# Patient Record
Sex: Female | Born: 1985
Health system: Southern US, Community
[De-identification: ages and names within clinical notes are randomized; demographics above are authoritative.]

## PROBLEM LIST (undated history)

## (undated) DIAGNOSIS — B977 Papillomavirus as the cause of diseases classified elsewhere: Secondary | ICD-10-CM

## (undated) DIAGNOSIS — Z8632 Personal history of gestational diabetes: Secondary | ICD-10-CM

## (undated) DIAGNOSIS — B1920 Unspecified viral hepatitis C without hepatic coma: Secondary | ICD-10-CM

## (undated) DIAGNOSIS — T7840XA Allergy, unspecified, initial encounter: Secondary | ICD-10-CM

## (undated) DIAGNOSIS — F1910 Other psychoactive substance abuse, uncomplicated: Secondary | ICD-10-CM

## (undated) DIAGNOSIS — J40 Bronchitis, not specified as acute or chronic: Secondary | ICD-10-CM

## (undated) HISTORY — DX: Papillomavirus as the cause of diseases classified elsewhere: B97.7

## (undated) HISTORY — DX: Bronchitis, not specified as acute or chronic: J40

## (undated) HISTORY — DX: Personal history of gestational diabetes: Z86.32

## (undated) HISTORY — DX: Other psychoactive substance abuse, uncomplicated: F19.10

## (undated) HISTORY — PX: HEMORRHOID BANDING: SHX5850

## (undated) HISTORY — DX: Allergy, unspecified, initial encounter: T78.40XA

## (undated) NOTE — *Deleted (*Deleted)
Physical Medicine and Rehabilitation Admission H&P    Chief Complaint  Patient presents with  . Medical Clearance  : HPI: Anita Davis is a 68 year old right-handed female with history of hepatitis C and polysubstance abuse.  Presented 01/04/2020 with altered mental status.  MRI of the brain showed no acute abnormality.  EEG showed focal nonconvulsive status epilepticus arising from left central temporal region additionally severe diffuse encephalopathy nonspecific etiology.  Admission chemistries AST 130 ALT 121, urine drug screen positive benzos as well as marijuana, alcohol level negative, urinalysis negative nitrite, hemoglobin 16, CK 324, SARS coronavirus negative.  LP completed showing no signs of infection.  Neurology consulted with MRI x2 completed during hospital stay again showing no acute changes.WOC consulted for stage I pressure injury to heels with conservative care.   Palliative care consulted to establish goals of care.  EEGs have continued to show focal seizures.  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  Patient did receive IV steroids and IVIG which made no considerable change in her overall mental status.  She is currently on a dysphagia #1 thin liquid diet.  Lovenox for DVT prophylaxis.  She remains on Vimpat 200 mg twice daily for seizure prophylaxis as well as phenobarbital 60 mg twice daily and Dilantin 200 mg twice daily.  Maintained on methadone for history of polysubstance abuse.  Psychiatry has been consulted continuing to follow.  Therapy evaluations completed and patient was admitted for a comprehensive rehab program  Review of Systems  Unable to perform ROS: Acuity of condition   Past Medical History:  Diagnosis Date  . Allergy   . Bronchitis   . Hepatitis-C   . History of gestational diabetes   . HPV in female   .  Substance abuse [facility])    Past Surgical History:  Procedure Laterality Date  . HEMORRHOID BANDING  age 80   Family History  Problem Relation Age of Onset  . Depression Mother   . Cervical cancer Mother   . Hypertension Father   . Diabetes Father    Social History:  reports that she has been smoking cigarettes and cigars. She has smoked for the past 24.00 years. She has never used smokeless tobacco. She reports previous drug use. Drugs: IV, Cocaine, Hydrocodone, Oxycodone, Marijuana, and Heroin. She reports that she does not drink alcohol. Allergies: No Known Allergies Medications Prior to Admission  Medication Sig Dispense Refill  . cephALEXin (KEFLEX) 500 MG capsule Take 500 mg by mouth 2 (two) times daily. For 5 days.    . cetirizine (ZYRTEC) 10 MG tablet Take 10 mg by mouth daily.    . hydrOXYzine (ATARAX/VISTARIL) 25 MG tablet Take 1-2 tablets (25-50 mg total) by mouth every 6 (six) hours as needed for anxiety. (Patient taking differently: Take 75 mg by mouth 5 (five) times daily. ) 15 tablet 0  . ibuprofen (ADVIL) 200 MG tablet Take 200 mg by mouth every 6 (six) hours as needed.    . METHADONE HCL PO Take 60 mg by mouth daily.     . ondansetron (ZOFRAN-ODT) 4 MG disintegrating tablet Take 4 mg by mouth 3 (three) times daily as needed for nausea or vomiting. Starting 12/24/2019 x 4 days.    . Polyethylene Glycol 3350 (MIRALAX PO) Take by mouth.      Drug Regimen Review { DRUG REGIMEN ZOXWRU:04540}  Home: Home Living Family/patient expects to be discharged to:: Unsure Additional  Comments: pt unable to report history 2/2 aphasia   Functional History: Prior Function Comments: unknown but was able to exit hospital recently so mobile at that time  Functional Status:  Mobility: Bed Mobility Overal bed mobility: Needs Assistance Bed Mobility: Supine to Sit, Sit to Supine Rolling: Mod assist Supine to sit: HOB elevated, Min assist Sit to supine: HOB elevated, Min assist Sit to  sidelying: Total assist General bed mobility comments: Pt able to transition supine <> sit R EOB with HOB elevated and VC's for LE management and HHA on L UE to ascend trunk. Transfers Overall transfer level: Needs assistance Equipment used: Rolling walker (2 wheeled) Transfers: Sit to/from Stand Sit to Stand: Min assist General transfer comment: STS from EOB and toilet, VCs and TCs repeatedly for hand placement on bed or bathroom rail rather than on RW for safety, unsteadiness noted upon coming to stand. Ambulation/Gait Ambulation/Gait assistance: Mod assist Gait Distance (Feet): 40 Feet (bouts of 20 ft x2 with seated rest break between bouts) Assistive device: Rolling walker (2 wheeled) Gait Pattern/deviations: Step-through pattern, Decreased stride length, Staggering left, Narrow base of support General Gait Details: Unsteadiness noted with gait, requiring continual VCs for line management and TCs and assistance to manage RW as she tends to bump or get stuck on obstacles and is unable to correct safely. At end of final gait bout, pt tipped RW over prior to sitting on EOB and required PT to catch RW to maintain safety. 3 LOB bouts towards L or posteriorly, requiring modA to recover.  Gait velocity: decreased Gait velocity interpretation: <1.31 ft/sec, indicative of household ambulator    ADL: ADL Overall ADL's : Needs assistance/impaired Eating/Feeding: Total assistance Eating/Feeding Details (indicate cue type and reason): declines all food and will not hold fork. pt iwth increased voiced tone with presented Grooming: Total assistance Grooming Details (indicate cue type and reason): sitting in chair with detial to between fingers Upper Body Bathing: Total assistance Lower Body Bathing: Total assistance Upper Body Dressing : Total assistance Lower Body Dressing: Min guard, Bed level Toilet Transfer: Moderate assistance, Stand-pivot, BSC Toileting - Clothing Manipulation Details  (indicate cue type and reason): no void - RN informed as they need void of bladder  Functional mobility during ADLs: Total assistance, +2 for physical assistance, +2 for safety/equipment General ADL Comments: pt requires total (A) for all adls.  Cognition: Cognition Overall Cognitive Status: No family/caregiver present to determine baseline cognitive functioning Orientation Level: Oriented X4 Cognition Arousal/Alertness: Awake/alert Behavior During Therapy: Agitated Overall Cognitive Status: No family/caregiver present to determine baseline cognitive functioning General Comments: Pt requires inc time and encouragement to perform tasks with repeated cues. Pt would randomly begin to get upset about some person involving her medical decisions and seemed like she was talking to someone when there was no one there. Oriented to person but no place or date or situation.  Physical Exam: Blood pressure (!) 140/98, pulse 91, temperature 98.7 F (37.1 C), temperature source Oral, resp. rate 18, height 5\' 5"  (1.651 m), weight 69.1 kg, SpO2 98 %. Physical Exam Neurological:     Comments: Patient is lethargic difficult to arouse limiting overall examination.  Nasogastric tube in place.  She did follow some inconsistent commands.     Results for orders placed or performed during the hospital encounter of 01/04/20 (from the past 48 hour(s))  Glucose, capillary     Status: None   Collection Time: 01/31/20 12:00 PM  Result Value Ref Range   Glucose-Capillary 95 70 -  99 mg/dL    Comment: Glucose reference range applies only to samples taken after fasting for at least 8 hours.   Comment 1 Notify RN    Comment 2 Document in Chart   Glucose, capillary     Status: Abnormal   Collection Time: 01/31/20  5:02 PM  Result Value Ref Range   Glucose-Capillary 113 (H) 70 - 99 mg/dL    Comment: Glucose reference range applies only to samples taken after fasting for at least 8 hours.   Comment 1 Notify RN     Comment 2 Document in Chart   Glucose, capillary     Status: Abnormal   Collection Time: 01/31/20  7:23 PM  Result Value Ref Range   Glucose-Capillary 108 (H) 70 - 99 mg/dL    Comment: Glucose reference range applies only to samples taken after fasting for at least 8 hours.   Comment 1 Notify RN    Comment 2 Document in Chart   Glucose, capillary     Status: Abnormal   Collection Time: 01/31/20 11:48 PM  Result Value Ref Range   Glucose-Capillary 116 (H) 70 - 99 mg/dL    Comment: Glucose reference range applies only to samples taken after fasting for at least 8 hours.  CBC with Differential/Platelet     Status: None   Collection Time: 02/01/20  1:07 AM  Result Value Ref Range   WBC 5.3 4.0 - 10.5 K/uL   RBC 4.26 3.87 - 5.11 MIL/uL   Hemoglobin 13.2 12.0 - 15.0 g/dL   HCT 16.1 36 - 46 %   MCV 92.3 80.0 - 100.0 fL   MCH 31.0 26.0 - 34.0 pg   MCHC 33.6 30.0 - 36.0 g/dL   RDW 09.6 04.5 - 40.9 %   Platelets 302 150 - 400 K/uL   nRBC 0.0 0.0 - 0.2 %   Neutrophils Relative % 53 %   Neutro Abs 2.8 1.7 - 7.7 K/uL   Lymphocytes Relative 40 %   Lymphs Abs 2.1 0.7 - 4.0 K/uL   Monocytes Relative 6 %   Monocytes Absolute 0.3 0.1 - 1.0 K/uL   Eosinophils Relative 1 %   Eosinophils Absolute 0.0 0 - 0 K/uL   Basophils Relative 0 %   Basophils Absolute 0.0 0 - 0 K/uL   Immature Granulocytes 0 %   Abs Immature Granulocytes 0.02 0.00 - 0.07 K/uL    Comment: Performed at [facility], [address], [place], [place] [address]  Comprehensive metabolic panel     Status: Abnormal   Collection Time: 02/01/20  1:07 AM  Result Value Ref Range   Sodium 135 135 - 145 mmol/L   Potassium 3.5 3.5 - 5.1 mmol/L   Chloride 101 98 - 111 mmol/L   CO2 25 22 - 32 mmol/L   Glucose, Bld 125 (H) 70 - 99 mg/dL    Comment: Glucose reference range applies only to samples taken after fasting for at least 8 hours.   BUN 8 6 - 20 mg/dL   Creatinine, Ser 4.78 0.44 - 1.00 mg/dL   Calcium 8.8 (L) 8.9 - 10.3  mg/dL   Total Protein 7.7 6.5 - 8.1 g/dL   Albumin 3.1 (L) 3.5 - 5.0 g/dL   AST 80 (H) 15 - 41 U/L   ALT 123 (H) 0 - 44 U/L   Alkaline Phosphatase 73 38 - 126 U/L   Total Bilirubin 0.7 0.3 - 1.2 mg/dL   GFR, Estimated >29 >56 mL/min  Anion gap 9 5 - 15    Comment: Performed at [facility], [address] [place], [place] [address]  Magnesium     Status: None   Collection Time: 02/01/20  1:07 AM  Result Value Ref Range   Magnesium 2.0 1.7 - 2.4 mg/dL    Comment: Performed at [facility], [address] [place], [place] [address]  Phosphorus     Status: None   Collection Time: 02/01/20  1:07 AM  Result Value Ref Range   Phosphorus 3.4 2.5 - 4.6 mg/dL    Comment: Performed at [facility], [address] [place], [place] [address]  Glucose, capillary     Status: Abnormal   Collection Time: 02/01/20  3:54 AM  Result Value Ref Range   Glucose-Capillary 120 (H) 70 - 99 mg/dL    Comment: Glucose reference range applies only to samples taken after fasting for at least 8 hours.   Comment 1 Notify RN    Comment 2 Document in Chart   Glucose, capillary     Status: Abnormal   Collection Time: 02/01/20  8:04 AM  Result Value Ref Range   Glucose-Capillary 102 (H) 70 - 99 mg/dL    Comment: Glucose reference range applies only to samples taken after fasting for at least 8 hours.  Glucose, capillary     Status: Abnormal   Collection Time: 02/01/20 12:00 PM  Result Value Ref Range   Glucose-Capillary 108 (H) 70 - 99 mg/dL    Comment: Glucose reference range applies only to samples taken after fasting for at least 8 hours.  Glucose, capillary     Status: Abnormal   Collection Time: 02/01/20  3:59 PM  Result Value Ref Range   Glucose-Capillary 129 (H) 70 - 99 mg/dL    Comment: Glucose reference range applies only to samples taken after fasting for at least 8 hours.  Glucose, capillary     Status: Abnormal   Collection Time: 02/01/20  8:42 PM  Result Value Ref Range    Glucose-Capillary 137 (H) 70 - 99 mg/dL    Comment: Glucose reference range applies only to samples taken after fasting for at least 8 hours.   Comment 1 Notify RN    Comment 2 Document in Chart   Glucose, capillary     Status: Abnormal   Collection Time: 02/01/20 11:34 PM  Result Value Ref Range   Glucose-Capillary 153 (H) 70 - 99 mg/dL    Comment: Glucose reference range applies only to samples taken after fasting for at least 8 hours.   Comment 1 Notify RN    Comment 2 Document in Chart   Glucose, capillary     Status: None   Collection Time: 02/02/20  4:04 AM  Result Value Ref Range   Glucose-Capillary 97 70 - 99 mg/dL    Comment: Glucose reference range applies only to samples taken after fasting for at least 8 hours.   Comment 1 Notify RN    Comment 2 Document in Chart   Glucose, capillary     Status: Abnormal   Collection Time: 02/02/20  7:43 AM  Result Value Ref Range   Glucose-Capillary 169 (H) 70 - 99 mg/dL    Comment: Glucose reference range applies only to samples taken after fasting for at least 8 hours.  Glucose, capillary     Status: None   Collection Time: 02/02/20 11:44 AM  Result Value Ref Range   Glucose-Capillary 83 70 -  99 mg/dL    Comment: Glucose reference range applies only to samples taken after fasting for at least 8 hours.   DG Abd Portable 1V  Result Date: 02/01/2020 CLINICAL DATA:  Evaluate feeding tube placement EXAM: PORTABLE ABDOMEN - 1 VIEW COMPARISON:  01/28/2020 FINDINGS: The tip of the feeding tube is well below the level of the GE junction in the expected location of the proximal duodenum/pylorus. IMPRESSION: Feeding tube tip in the proximal duodenum/pylorus. Electronically Signed   By: Signa Kell M.D.   On: 02/01/2020 09:59       Medical Problem List and Plan: 1.  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  Patient is on contact precautions  -patient may *** shower   -ELOS/Goals: *** 2.  Antithrombotics: -DVT/anticoagulation: Lovenox  -antiplatelet therapy: *** 3. Pain Management: *** 4. Mood: Amantadine 100 mg twice daily, Xanax 0.25 mg twice daily  -antipsychotic agents: N/A 5. Neuropsych: This patient is not capable of making decisions on her own behalf. 6. Skin/Wound Care: Routine skin checks 7. Fluids/Electrolytes/Nutrition: Routine in and outs with follow-up chemistries 8.  Dysphagia.  Dysphagia #1 nectar liquids.  Supplemental tube feeds. 9.  Seizure disorder.  Dilantin 200 mg twice daily, Vimpat 200 mg twice daily, phenobarbital 60 mg twice daily 10.  History of polysubstance use.  Methadone 60 mg daily   ***  Charlton Amor, PA-C 02/02/2020

---

## 2016-06-14 ENCOUNTER — Emergency Department (HOSPITAL_COMMUNITY)
Admission: EM | Admit: 2016-06-14 | Discharge: 2016-06-14 | Disposition: A | Discharge status: Home / Self Care | Payer: Medicaid Other | Attending: Emergency Medicine | Admitting: Emergency Medicine

## 2016-06-14 ENCOUNTER — Encounter (HOSPITAL_COMMUNITY): Payer: Self-pay | Admitting: Emergency Medicine

## 2016-06-14 ENCOUNTER — Emergency Department (HOSPITAL_COMMUNITY): Payer: Medicaid Other

## 2016-06-14 DIAGNOSIS — Y9389 Activity, other specified: Secondary | ICD-10-CM | POA: Diagnosis not present

## 2016-06-14 DIAGNOSIS — S92244A Nondisplaced fracture of medial cuneiform of right foot, initial encounter for closed fracture: Secondary | ICD-10-CM | POA: Insufficient documentation

## 2016-06-14 DIAGNOSIS — W208XXA Other cause of strike by thrown, projected or falling object, initial encounter: Secondary | ICD-10-CM | POA: Diagnosis not present

## 2016-06-14 DIAGNOSIS — Y929 Unspecified place or not applicable: Secondary | ICD-10-CM | POA: Diagnosis not present

## 2016-06-14 DIAGNOSIS — S92351A Displaced fracture of fifth metatarsal bone, right foot, initial encounter for closed fracture: Secondary | ICD-10-CM | POA: Diagnosis not present

## 2016-06-14 DIAGNOSIS — Z23 Encounter for immunization: Secondary | ICD-10-CM | POA: Insufficient documentation

## 2016-06-14 DIAGNOSIS — Y999 Unspecified external cause status: Secondary | ICD-10-CM | POA: Insufficient documentation

## 2016-06-14 DIAGNOSIS — S92331A Displaced fracture of third metatarsal bone, right foot, initial encounter for closed fracture: Secondary | ICD-10-CM | POA: Diagnosis not present

## 2016-06-14 DIAGNOSIS — F17210 Nicotine dependence, cigarettes, uncomplicated: Secondary | ICD-10-CM | POA: Diagnosis not present

## 2016-06-14 DIAGNOSIS — S99921A Unspecified injury of right foot, initial encounter: Secondary | ICD-10-CM | POA: Diagnosis present

## 2016-06-14 DIAGNOSIS — S92234A Nondisplaced fracture of intermediate cuneiform of right foot, initial encounter for closed fracture: Secondary | ICD-10-CM | POA: Insufficient documentation

## 2016-06-14 DIAGNOSIS — S92301A Fracture of unspecified metatarsal bone(s), right foot, initial encounter for closed fracture: Secondary | ICD-10-CM

## 2016-06-14 DIAGNOSIS — S92321A Displaced fracture of second metatarsal bone, right foot, initial encounter for closed fracture: Secondary | ICD-10-CM | POA: Diagnosis not present

## 2016-06-14 HISTORY — DX: Unspecified viral hepatitis C without hepatic coma: B19.20

## 2016-06-14 MED ORDER — HYDROCODONE-ACETAMINOPHEN 5-325 MG PO TABS: 2.0000 | ORAL_TABLET | ORAL | 0 refills | Status: DC | PRN

## 2016-06-14 MED ORDER — TETANUS-DIPHTH-ACELL PERTUSSIS 5-2.5-18.5 LF-MCG/0.5 IM SUSP
0.5000 mL | Freq: Once | INTRAMUSCULAR | Status: AC
  Administered 2016-06-14: 0.5 mL via INTRAMUSCULAR
  Filled 2016-06-14: qty 0.5

## 2016-06-14 MED ORDER — IBUPROFEN 800 MG PO TABS: 800.0000 mg | ORAL_TABLET | Freq: Three times a day (TID) | ORAL | 0 refills | Status: DC

## 2016-06-14 MED ORDER — HYDROCODONE-ACETAMINOPHEN 5-325 MG PO TABS
2.0000 | ORAL_TABLET | Freq: Once | ORAL | Status: AC
Start: 2016-06-14 — End: 2016-06-14
  Administered 2016-06-14: 2 via ORAL
  Filled 2016-06-14: qty 2

## 2016-06-14 NOTE — ED Triage Notes (Signed)
Pt reports trying to move a 200 lb AC unit from a truck this morning and it fell onto right foot and ankle, states foot is getting numb. Pulses and sensation present.

## 2016-06-14 NOTE — Discharge Instructions (Signed)
Keep the splint in place - foot elevated - crutches - non weight bearing Motrin 3 times daily Ice packs to foot Hydrocodone for severe pain  Please obtain all of your results from medical records or have your doctors office obtain the results - share them with your doctor - you should be seen at your doctors office in the next 2 days. Call today to arrange your follow up. Take the medications as prescribed. Please review all of the medicines and only take them if you do not have an allergy to them. Please be aware that if you are taking birth control pills, taking other prescriptions, ESPECIALLY ANTIBIOTICS may make the birth control ineffective - if this is the case, either do not engage in sexual activity or use alternative methods of birth control such as condoms until you have finished the medicine and your family doctor says it is OK to restart them. If you are on a blood thinner such as COUMADIN, be aware that any other medicine that you take may cause the coumadin to either work too much, or not enough - you should have your coumadin level rechecked in next 7 days if this is the case.  ?  It is also a possibility that you have an allergic reaction to any of the medicines that you have been prescribed - Everybody reacts differently to medications and while MOST people have no trouble with most medicines, you may have a reaction such as nausea, vomiting, rash, swelling, shortness of breath. If this is the case, please stop taking the medicine immediately and contact your physician.  ?  You should return to the ER if you develop severe or worsening symptoms.

## 2016-06-14 NOTE — ED Provider Notes (Signed)
[facility]-EMERGENCY DEPT Provider Note   CSN: [id number] Arrival date & time: 06/14/16  1021  By signing my name below, I, Anita Davis, attest that this documentation has been prepared under the direction and in the presence of Anita Hong, MD. Electronically Signed: Cynda Davis, Scribe. 06/14/16. 11:12 AM.   History   Chief Complaint Chief Complaint  Patient presents with  . Foot Injury   HPI Comments: Anita Davis is a 31 y.o. female who presents to the Emergency Department complaining of sudden-onset, constant right foot pain s/p a foot injury that happened this morning. Patient reports trying to move a 200 pound [employer] unit from a truck this morning. The patient reports accidentally dropping the [facility] unit on her right foot. Patient has associated right middle toe numbness and ankle pain. No modifying factors indicated. Patient denies any other symptoms.   The history is provided by the patient. No language interpreter was used.    Past Medical History:  Diagnosis Date  . Hepatitis-C     There are no active problems to display for this patient.   Past Surgical History:  Procedure Laterality Date  . HEMORRHOID BANDING      OB History    No data available       Home Medications    Prior to Admission medications   Medication Sig Start Date End Date Taking? Authorizing Provider  HYDROcodone-acetaminophen (NORCO/VICODIN) 5-325 MG tablet Take 2 tablets by mouth every 4 (four) hours as needed. 06/14/16   Anita Hong, MD  ibuprofen (ADVIL,MOTRIN) 800 MG tablet Take 1 tablet (800 mg total) by mouth 3 (three) times daily. 06/14/16   Anita Hong, MD    Family History History reviewed. No pertinent family history.  Social History Social History  Substance Use Topics  . Smoking status: Current Every Day Smoker    Types: E-cigarettes  . Smokeless tobacco: Not on file  . Alcohol use No    Allergies   Patient has no known allergies.  Review of Systems Review of Systems    Musculoskeletal: Positive for arthralgias (right foot, right ankle).  All other systems reviewed and are negative.  Physical Exam Updated Vital Signs BP 134/86 (BP Location: Left Arm)   Pulse 86   Temp 97.9 F (36.6 C) (Oral)   Resp 24   Ht 5\' 3"  (1.6 m)   Wt 145 lb (65.8 kg)   LMP 05/31/2016 (Exact Date)   SpO2 96%   BMI 25.69 kg/m   Physical Exam  Constitutional: She is oriented to person, place, and time. She appears well-developed.  HENT:  Head: Normocephalic and atraumatic.  Mouth/Throat: Oropharynx is clear and moist.  Eyes: Conjunctivae and EOM are normal. Pupils are equal, round, and reactive to light.  Neck: Normal range of motion. Neck supple.  Cardiovascular: Normal rate.   Pulmonary/Chest: Effort normal.  Abdominal: Soft. Bowel sounds are normal.  Musculoskeletal: Normal range of motion.  Good pulse of the posterior tibial. Contusion of the dorsum of the right foot with minimal swelling. Decreed range of motion secondary to pain.Decreased sensation of the middle right toe.   Neurological: She is alert and oriented to person, place, and time.  Skin: Skin is warm and dry.  Psychiatric: She has a normal mood and affect.     ED Treatments / Results  DIAGNOSTIC STUDIES: Oxygen Saturation is 96% on RA, adequate by my interpretation.    COORDINATION OF CARE: 11:11 AM Discussed treatment plan with pt at bedside and pt agreed to plan,  which includes pain medication and imaging.   Labs (all labs ordered are listed, but only abnormal results are displayed) Labs Reviewed - No data to display  Radiology Dg Ankle Complete Right  Result Date: 06/14/2016 CLINICAL DATA:  Right foot and ankle pain status post trauma. EXAM: RIGHT ANKLE - COMPLETE 3+ VIEW COMPARISON:  None. FINDINGS: There is no evidence of fracture, dislocation, or joint effusion of the right ankle. There is no evidence of arthropathy or other focal bone abnormality. Right foot fractures are better seen on  the dedicated foot radiographs. Soft tissues are unremarkable. IMPRESSION: No evidence of fracture dislocation of the right ankle. Electronically Signed   By: Anita Davis  Dimitrova M.D.   On: 06/14/2016 11:02   Dg Foot Complete Right  Result Date: 06/14/2016 CLINICAL DATA:  Right foot pain and abrasion post crushing injury. EXAM: RIGHT FOOT COMPLETE - 3+ VIEW COMPARISON:  None. FINDINGS: There are minimally displaced fractures of the base of the first, second and third metatarsal bones, with intra-articular extension. Linear lucencies through the medial and intermediate cuneiform likely represent nondisplaced fractures. There is a potential for Lisfranc joint disruption. There is an associated soft tissue swelling. IMPRESSION: Minimally displaced comminuted fractures of the base of the first, second and third metatarsal bones. Potential nondisplaced fractures of the medial and intermediate cuneiform bones. Potential disruption of the Lisfranc joint. Orthopedic consultation may be considered. Electronically Signed   By: Anita Davis  Dimitrova M.D.   On: 06/14/2016 11:00    Procedures Procedures (including critical care time)  Medications Ordered in ED Medications  HYDROcodone-acetaminophen (NORCO/VICODIN) 5-325 MG per tablet 2 tablet (2 tablets Oral Given 06/14/16 1118)  Tdap (BOOSTRIX) injection 0.5 mL (0.5 mLs Intramuscular Given 06/14/16 1119)     Initial Impression / Assessment and Plan / ED Course  I have reviewed the triage vital signs and the nursing notes.  Pertinent labs & imaging results that were available during my care of the patient were reviewed by me and considered in my medical decision making (see chart for details).    Fractures seen - MT's and cuneiforms D/w Dr. Romeo AppleHarrison - will see in clinic Posterior splint and crutches Pt informed No compartment syndrome seen  Final Clinical Impressions(s) / ED Diagnoses   Final diagnoses:  Closed displaced fracture of metatarsal bone of  right foot, unspecified metatarsal, initial encounter    New Prescriptions New Prescriptions   HYDROCODONE-ACETAMINOPHEN (NORCO/VICODIN) 5-325 MG TABLET    Take 2 tablets by mouth every 4 (four) hours as needed.   IBUPROFEN (ADVIL,MOTRIN) 800 MG TABLET    Take 1 tablet (800 mg total) by mouth 3 (three) times daily.   I personally performed the services described in this documentation, which was scribed in my presence. The recorded information has been reviewed and is accurate.         Anita HongBrian Oluwademilade Kellett, MD 06/14/16 [phone]

## 2016-06-18 ENCOUNTER — Ambulatory Visit (INDEPENDENT_AMBULATORY_CARE_PROVIDER_SITE_OTHER): Payer: Medicaid Other | Admitting: Orthopedic Surgery

## 2016-06-18 DIAGNOSIS — S92314A Nondisplaced fracture of first metatarsal bone, right foot, initial encounter for closed fracture: Secondary | ICD-10-CM

## 2016-06-18 DIAGNOSIS — S92324A Nondisplaced fracture of second metatarsal bone, right foot, initial encounter for closed fracture: Secondary | ICD-10-CM | POA: Diagnosis not present

## 2016-06-18 DIAGNOSIS — S92334A Nondisplaced fracture of third metatarsal bone, right foot, initial encounter for closed fracture: Secondary | ICD-10-CM

## 2016-06-18 MED ORDER — HYDROCODONE-ACETAMINOPHEN 5-325 MG PO TABS: 2.0000 | ORAL_TABLET | ORAL | 0 refills | Status: DC | PRN

## 2016-06-18 MED ORDER — HYDROCODONE-ACETAMINOPHEN 5-325 MG PO TABS: 1.0000 | ORAL_TABLET | ORAL | 0 refills | Status: DC | PRN

## 2016-06-18 NOTE — Progress Notes (Addendum)
Patient ID: Anita Davis, female   DOB: Aug 29, 1985, 31 y.o.   MRN: [id number]  Pain right foot  HPI Anita Davis is a 31 y.o. female.  31 year old female presents for evaluation after blunt trauma to the right foot secondary to large object (250 pound air conditioner) falling onto the foot. She sustained a closed first, second, third metatarsal fracture. She was placed in a posterior splint  She complains of moderate to severe pain and swelling with some decreased sensation in all the digits of the toes on the right foot  Review of Systems Review of Systems  Constitutional: Negative for fever.  Musculoskeletal: Positive for gait problem and joint swelling.  Neurological: Positive for numbness. Negative for weakness.   (2 MINIMUM)  Past Medical History:  Diagnosis Date  . Hepatitis-C     Past Surgical History:  Procedure Laterality Date  . HEMORRHOID BANDING      Social History Social History  Substance Use Topics  . Smoking status: Current Every Day Smoker    Types: E-cigarettes  . Smokeless tobacco: Not on file  . Alcohol use No    No Known Allergies  No outpatient prescriptions have been marked as taking for the 06/18/16 encounter (Appointment) with Vickki HearingStanley E Harrison, MD.      Physical Exam Physical Exam 1.LMP 05/31/2016 (Exact Date)   2. Gen. appearance. The patient is well-developed and well-nourished, grooming and hygiene are normal. There are no gross congenital abnormalities  3. The patient is alert and oriented to person place and time  4. Mood and affect are normal  5. Ambulation Extremely guarded gait secondary to fracture supported by Crutches no weightbearing  Examination reveals the following: 6. On inspection we find tenderness and swelling over the dorsum of the midfoot  7. With the range of motion of  right ankle is normal  8. Stability tests were normal  right ankle normal, could not test Lisfranc joint  9. Strength tests revealed grade 5  motor strength  10. Skin we find dorsal abrasion   11. Sensation decreased in all 5 digits  12 Vascular system shows no peripheral edema  I did not detect intense or tense compartments  MEDICAL DECISION MAKING:    Data Reviewed X-rays ankle and foot first second and third metatarsal base fractures with nondisplaced fracture medial and middle cuneiform  Assessment Encounter Diagnoses  Name Primary?  . Closed nondisplaced fracture of first metatarsal bone of right foot, initial encounter Yes  . Closed nondisplaced fracture of second metatarsal bone of right foot, initial encounter   . Closed nondisplaced fracture of third metatarsal bone of right foot, initial encounter    Meds ordered this encounter  Medications  . HYDROcodone-acetaminophen (NORCO/VICODIN) 5-325 MG tablet    Sig: Take 2 tablets by mouth every 4 (four) hours as needed.    Dispense:  30 tablet    Refill:  0     Plan Short Cam Walker no weightbearing 6 weeks No work 6 weeks Return 6 weeks x-ray right foot may need stress x-ray  Fuller CanadaStanley Harrison 06/18/2016, 1:33 PM

## 2016-06-18 NOTE — Patient Instructions (Addendum)
No weightbearing for 6 weeks  No work x 6 weeks

## 2016-07-30 ENCOUNTER — Ambulatory Visit (INDEPENDENT_AMBULATORY_CARE_PROVIDER_SITE_OTHER): Payer: Medicaid Other

## 2016-07-30 ENCOUNTER — Encounter: Payer: Self-pay | Admitting: Orthopedic Surgery

## 2016-07-30 ENCOUNTER — Ambulatory Visit (INDEPENDENT_AMBULATORY_CARE_PROVIDER_SITE_OTHER): Payer: Self-pay | Admitting: Orthopedic Surgery

## 2016-07-30 DIAGNOSIS — S92334A Nondisplaced fracture of third metatarsal bone, right foot, initial encounter for closed fracture: Secondary | ICD-10-CM

## 2016-07-30 DIAGNOSIS — S92314D Nondisplaced fracture of first metatarsal bone, right foot, subsequent encounter for fracture with routine healing: Secondary | ICD-10-CM

## 2016-07-30 DIAGNOSIS — S92314A Nondisplaced fracture of first metatarsal bone, right foot, initial encounter for closed fracture: Secondary | ICD-10-CM

## 2016-07-30 DIAGNOSIS — S92324A Nondisplaced fracture of second metatarsal bone, right foot, initial encounter for closed fracture: Secondary | ICD-10-CM

## 2016-07-30 MED ORDER — HYDROCODONE-ACETAMINOPHEN 5-325 MG PO TABS: 1.0000 | ORAL_TABLET | Freq: Four times a day (QID) | ORAL | 0 refills | Status: DC | PRN

## 2016-07-30 NOTE — Progress Notes (Signed)
Chief Complaint  Patient presents with  . Follow-up    RIGHT FOOT FRACTURE, DOI 06/14/16    Right foot multiple fractures from air conditioner falling on her right foot she's been in a boot. She's been weightbearing in the boot. Her x-rays show no tarsometatarsal displacement  She still has some numbness on the top of the foot some weakness in extension flexion of the toes and some pain on the dorsal top of the foot  Foot alignment looks normal there is no instability of the tarsometatarsal joint there is some tenderness and she does exhibit decreased flexion of the MTP joints of all digits  X-rays show that the fractures are healing is no displacement of the tarsometatarsal joints  She will ambulate for 3 more weeks and her Cam Walker and then come back for follow-up visit no x-rays needed at that time she may return to work with limited activity per her limits regarding pain  Meds ordered this encounter  Medications  . HYDROcodone-acetaminophen (NORCO/VICODIN) 5-325 MG tablet    Sig: Take 1 tablet by mouth every 6 (six) hours as needed.    Dispense:  30 tablet    Refill:  0

## 2016-08-20 ENCOUNTER — Ambulatory Visit: Payer: Medicaid Other | Admitting: Orthopedic Surgery

## 2016-08-22 ENCOUNTER — Emergency Department (HOSPITAL_COMMUNITY)
Admission: EM | Admit: 2016-08-22 | Discharge: 2016-08-22 | Disposition: A | Discharge status: Home / Self Care | Payer: Self-pay | Attending: Emergency Medicine | Admitting: Emergency Medicine

## 2016-08-22 ENCOUNTER — Encounter (HOSPITAL_COMMUNITY): Payer: Self-pay | Admitting: Emergency Medicine

## 2016-08-22 DIAGNOSIS — R112 Nausea with vomiting, unspecified: Secondary | ICD-10-CM | POA: Insufficient documentation

## 2016-08-22 DIAGNOSIS — J029 Acute pharyngitis, unspecified: Secondary | ICD-10-CM | POA: Insufficient documentation

## 2016-08-22 DIAGNOSIS — Z79899 Other long term (current) drug therapy: Secondary | ICD-10-CM | POA: Insufficient documentation

## 2016-08-22 DIAGNOSIS — R197 Diarrhea, unspecified: Secondary | ICD-10-CM | POA: Insufficient documentation

## 2016-08-22 DIAGNOSIS — F17210 Nicotine dependence, cigarettes, uncomplicated: Secondary | ICD-10-CM | POA: Insufficient documentation

## 2016-08-22 DIAGNOSIS — Z791 Long term (current) use of non-steroidal anti-inflammatories (NSAID): Secondary | ICD-10-CM | POA: Insufficient documentation

## 2016-08-22 DIAGNOSIS — J069 Acute upper respiratory infection, unspecified: Secondary | ICD-10-CM | POA: Insufficient documentation

## 2016-08-22 MED ORDER — PSEUDOEPHEDRINE HCL 60 MG PO TABS
60.0000 mg | ORAL_TABLET | Freq: Once | ORAL | Status: AC
  Administered 2016-08-22: 60 mg via ORAL
  Filled 2016-08-22: qty 1

## 2016-08-22 MED ORDER — AMOXICILLIN 250 MG PO CAPS
500.0000 mg | ORAL_CAPSULE | Freq: Once | ORAL | Status: AC
  Administered 2016-08-22: 500 mg via ORAL
  Filled 2016-08-22: qty 2

## 2016-08-22 MED ORDER — TRAMADOL HCL 50 MG PO TABS
100.0000 mg | ORAL_TABLET | Freq: Once | ORAL | Status: AC
  Administered 2016-08-22: 100 mg via ORAL
  Filled 2016-08-22: qty 2

## 2016-08-22 MED ORDER — LIDOCAINE VISCOUS 2 % MT SOLN
15.0000 mL | Freq: Once | OROMUCOSAL | Status: AC
Start: 2016-08-22 — End: 2016-08-22
  Administered 2016-08-22: 15 mL via OROMUCOSAL
  Filled 2016-08-22: qty 15

## 2016-08-22 MED ORDER — TRAMADOL HCL 50 MG PO TABS: 50.0000 mg | ORAL_TABLET | Freq: Four times a day (QID) | ORAL | 0 refills | Status: DC | PRN

## 2016-08-22 MED ORDER — DEXAMETHASONE SODIUM PHOSPHATE 4 MG/ML IJ SOLN
8.0000 mg | Freq: Once | INTRAMUSCULAR | Status: AC
  Administered 2016-08-22: 8 mg via INTRAMUSCULAR
  Filled 2016-08-22: qty 2

## 2016-08-22 MED ORDER — AMOXICILLIN 500 MG PO CAPS: 500.0000 mg | ORAL_CAPSULE | Freq: Three times a day (TID) | ORAL | 0 refills | Status: DC

## 2016-08-22 NOTE — Discharge Instructions (Signed)
Your examination suggest a pharyngitis, sinusitis, and symptoms of upper respiratory infection. Please use Amoxil 3 times daily with food. Please use Sudafed, for Claritin-D for congestion in the sinuses and also to assist with headache. Please continue Tylenol every 4 hours, ibuprofen every 6 hours for moderate pain. May use Ultram for more severe pain. Ultram may cause drowsiness, please use this medication with caution. Please use your  mask until symptoms have resolved. Please wash hands frequently, and increase fluids.

## 2016-08-22 NOTE — ED Provider Notes (Signed)
[facility] DEPT Provider Note   CSN: [id number] Arrival date & time: 08/22/16  1034     History   Chief Complaint Chief Complaint  Patient presents with  . flu like sx    HPI Anita Davis is a 31 y.o. female.   URI   This is a new problem. The current episode started more than 2 days ago. The problem has been gradually worsening. Associated symptoms include diarrhea, nausea, vomiting, congestion, headaches, sore throat and cough. She has tried rest (tylenol) for the symptoms. The treatment provided no relief.    Past Medical History:  Diagnosis Date  . Hepatitis-C     There are no active problems to display for this patient.   Past Surgical History:  Procedure Laterality Date  . HEMORRHOID BANDING      OB History    No data available       Home Medications    Prior to Admission medications   Medication Sig Start Date End Date Taking? Authorizing Provider  HYDROcodone-acetaminophen (NORCO/VICODIN) 5-325 MG tablet Take 1 tablet by mouth every 6 (six) hours as needed. 07/30/16   Vickki Hearing, MD  ibuprofen (ADVIL,MOTRIN) 800 MG tablet Take 1 tablet (800 mg total) by mouth 3 (three) times daily. 06/14/16   [name], MD    Family History History reviewed. No pertinent family history.  Social History Social History  Substance Use Topics  . Smoking status: Current Every Day Smoker    Types: E-cigarettes  . Smokeless tobacco: Never Used  . Alcohol use No     Allergies   Patient has no known allergies.   Review of Systems Review of Systems  Constitutional: Positive for chills.  HENT: Positive for congestion and sore throat.   Respiratory: Positive for cough.   Gastrointestinal: Positive for diarrhea, nausea and vomiting.  Musculoskeletal: Positive for myalgias.  Neurological: Positive for headaches.  All other systems reviewed and are negative.    Physical Exam Updated Vital Signs BP 132/72   Pulse 77   Temp 98.8 F (37.1 C) (Oral)    Resp 18   Ht  (1.6 m)   Wt 68 kg   LMP 08/15/2016   SpO2 100%   BMI 26.57 kg/m   Physical Exam  Constitutional: She is oriented to person, place, and time. She appears well-developed and well-nourished.  Non-toxic appearance.  HENT:  Head: Normocephalic.  Right Ear: Tympanic membrane and external ear normal.  Left Ear: Tympanic membrane and external ear normal.  Mouth/Throat: Uvula swelling present. Posterior oropharyngeal erythema present.  Nasal congestion present.  Eyes: EOM and lids are normal. Pupils are equal, round, and reactive to light.  Neck: Normal range of motion. Neck supple. Carotid bruit is not present.  Cardiovascular: Normal rate, regular rhythm, normal heart sounds, intact distal pulses and normal pulses.   Pulmonary/Chest: Breath sounds normal. No respiratory distress.  Abdominal: Soft. Bowel sounds are normal. There is no tenderness. There is no guarding.  Musculoskeletal: Normal range of motion.  Lymphadenopathy:       Head (right side): No submandibular adenopathy present.       Head (left side): No submandibular adenopathy present.    She has no cervical adenopathy.  Neurological: She is alert and oriented to person, place, and time. She has normal strength. No cranial nerve deficit or sensory deficit.  Skin: Skin is warm and dry.  No rash in the palm of the hands.  Psychiatric: She has a normal mood and affect. Her  speech is normal.  Nursing note and vitals reviewed.    ED Treatments / Results  Labs (all labs ordered are listed, but only abnormal results are displayed) Labs Reviewed - No data to display  EKG  EKG Interpretation None       Radiology No results found.  Procedures Procedures (including critical care time)  Medications Ordered in ED Medications - No data to display   Initial Impression / Assessment and Plan / ED Course  I have reviewed the triage vital signs and the nursing notes.  Pertinent labs & imaging results  that were available during my care of the patient were reviewed by me and considered in my medical decision making (see chart for details).       Final Clinical Impressions(s) / ED Diagnoses MDM Patient is a[age] year old female with upper respiratory symptoms over the last 3 days. She's had some nausea vomiting 2 on yesterday. Her vital signs within normal limits, and show no evidence of severe dehydration. The examination favors acute pharyngitis and upper respiratory infection. The patient will be treated with Amoxil. Patient is to follow-up with her primary physician or return to the emergency department if any significant changes, problems, or concerns.    Final diagnoses:  Acute pharyngitis, unspecified etiology  Upper respiratory tract infection, unspecified type    New Prescriptions Discharge Medication List as of 08/22/2016 12:20 PM    START taking these medications   Details  amoxicillin (AMOXIL) 500 MG capsule Take 1 capsule (500 mg total) by mouth 3 (three) times daily., Starting Wed 08/22/2016, Print    traMADol (ULTRAM) 50 MG tablet Take 1 tablet (50 mg total) by mouth every 6 (six) hours as needed., Starting Wed 08/22/2016, Print         Ivery Quale, PA-C 08/23/16 Jeni Salles, MD 08/25/16 [phone]

## 2016-08-22 NOTE — ED Triage Notes (Signed)
Pt c/o gen body aches, sore throat and fever x 3 days. n/v x 2 yesterday. Has eaten since with no vomiting. nad. No weakness noted.

## 2016-08-27 ENCOUNTER — Ambulatory Visit (INDEPENDENT_AMBULATORY_CARE_PROVIDER_SITE_OTHER): Payer: Self-pay

## 2016-08-27 ENCOUNTER — Ambulatory Visit (INDEPENDENT_AMBULATORY_CARE_PROVIDER_SITE_OTHER): Payer: Self-pay | Admitting: Orthopedic Surgery

## 2016-08-27 ENCOUNTER — Encounter: Payer: Self-pay | Admitting: Orthopedic Surgery

## 2016-08-27 DIAGNOSIS — S92334D Nondisplaced fracture of third metatarsal bone, right foot, subsequent encounter for fracture with routine healing: Secondary | ICD-10-CM

## 2016-08-27 DIAGNOSIS — S92341D Displaced fracture of fourth metatarsal bone, right foot, subsequent encounter for fracture with routine healing: Secondary | ICD-10-CM

## 2016-08-27 DIAGNOSIS — S92324D Nondisplaced fracture of second metatarsal bone, right foot, subsequent encounter for fracture with routine healing: Secondary | ICD-10-CM

## 2016-08-27 DIAGNOSIS — S92314D Nondisplaced fracture of first metatarsal bone, right foot, subsequent encounter for fracture with routine healing: Secondary | ICD-10-CM

## 2016-08-27 MED ORDER — HYDROCODONE-ACETAMINOPHEN 5-325 MG PO TABS: 1.0000 | ORAL_TABLET | Freq: Three times a day (TID) | ORAL | 0 refills | Status: DC | PRN

## 2016-08-27 NOTE — Progress Notes (Signed)
Chief Complaint  Patient presents with  . Follow-up    Recheck right foot fracture, DOi 06-14-16.   C/O PAIN   TENDER AT THE FRACTURE SITES BUT ITS MILD   I THINK THE BOOT CAN COME OFF IN 2 WEEKS   FU AS NEEDED   Meds ordered this encounter  Medications  . HYDROcodone-acetaminophen (NORCO/VICODIN) 5-325 MG tablet    Sig: Take 1 tablet by mouth every 8 (eight) hours as needed.    Dispense:  30 tablet    Refill:  0    Encounter Diagnoses  Name Primary?  . Closed nondisplaced fracture of first metatarsal bone of right foot with routine healing, subsequent encounter Yes  . Displaced fracture of fourth metatarsal bone, right foot, subsequent encounter for fracture with routine healing   . Closed nondisplaced fracture of second metatarsal bone of right foot with routine healing, subsequent encounter   . Closed nondisplaced fracture of third metatarsal bone of right foot with routine healing, subsequent encounter

## 2017-01-23 ENCOUNTER — Emergency Department (HOSPITAL_COMMUNITY): Payer: Self-pay

## 2017-01-23 ENCOUNTER — Inpatient Hospital Stay (HOSPITAL_COMMUNITY)
Admission: EM | Admit: 2017-01-23 | Discharge: 2017-01-25 | DRG: 392 | Discharge status: Against Medical Advice | Payer: Self-pay | Attending: Internal Medicine | Admitting: Internal Medicine

## 2017-01-23 ENCOUNTER — Encounter (HOSPITAL_COMMUNITY): Payer: Self-pay | Admitting: Emergency Medicine

## 2017-01-23 DIAGNOSIS — R739 Hyperglycemia, unspecified: Secondary | ICD-10-CM

## 2017-01-23 DIAGNOSIS — R000 Tachycardia, unspecified: Secondary | ICD-10-CM | POA: Diagnosis present

## 2017-01-23 DIAGNOSIS — R109 Unspecified abdominal pain: Principal | ICD-10-CM | POA: Diagnosis present

## 2017-01-23 DIAGNOSIS — K219 Gastro-esophageal reflux disease without esophagitis: Secondary | ICD-10-CM | POA: Diagnosis present

## 2017-01-23 DIAGNOSIS — D649 Anemia, unspecified: Secondary | ICD-10-CM

## 2017-01-23 DIAGNOSIS — K810 Acute cholecystitis: Secondary | ICD-10-CM

## 2017-01-23 DIAGNOSIS — F17210 Nicotine dependence, cigarettes, uncomplicated: Secondary | ICD-10-CM | POA: Diagnosis present

## 2017-01-23 DIAGNOSIS — Z23 Encounter for immunization: Secondary | ICD-10-CM

## 2017-01-23 DIAGNOSIS — Z8049 Family history of malignant neoplasm of other genital organs: Secondary | ICD-10-CM

## 2017-01-23 DIAGNOSIS — Z833 Family history of diabetes mellitus: Secondary | ICD-10-CM

## 2017-01-23 DIAGNOSIS — R509 Fever, unspecified: Secondary | ICD-10-CM

## 2017-01-23 DIAGNOSIS — Z8249 Family history of ischemic heart disease and other diseases of the circulatory system: Secondary | ICD-10-CM

## 2017-01-23 DIAGNOSIS — Z818 Family history of other mental and behavioral disorders: Secondary | ICD-10-CM

## 2017-01-23 DIAGNOSIS — R1011 Right upper quadrant pain: Secondary | ICD-10-CM

## 2017-01-23 LAB — COMPREHENSIVE METABOLIC PANEL
ALBUMIN: 4.1 g/dL (ref 3.5–5.0)
ALT: 51 U/L (ref 14–54)
AST: 47 U/L — AB (ref 15–41)
Alkaline Phosphatase: 65 U/L (ref 38–126)
Anion gap: 7 (ref 5–15)
BILIRUBIN TOTAL: 0.5 mg/dL (ref 0.3–1.2)
BUN: 8 mg/dL (ref 6–20)
CHLORIDE: 100 mmol/L — AB (ref 101–111)
CO2: 28 mmol/L (ref 22–32)
CREATININE: 0.65 mg/dL (ref 0.44–1.00)
Calcium: 8.7 mg/dL — ABNORMAL LOW (ref 8.9–10.3)
GFR calc Af Amer: >60 mL/min (ref >60)
GLUCOSE: 112 mg/dL — AB (ref 65–99)
Potassium: 3.9 mmol/L (ref 3.5–5.1)
Sodium: 135 mmol/L (ref 135–145)
Total Protein: 7.5 g/dL (ref 6.5–8.1)

## 2017-01-23 LAB — URINALYSIS, ROUTINE W REFLEX MICROSCOPIC
BILIRUBIN URINE: NEGATIVE
GLUCOSE, UA: NEGATIVE mg/dL
HGB URINE DIPSTICK: NEGATIVE
Ketones, ur: NEGATIVE mg/dL
Leukocytes, UA: NEGATIVE
Nitrite: NEGATIVE
Protein, ur: NEGATIVE mg/dL
SPECIFIC GRAVITY, URINE: 1.01 (ref 1.005–1.030)
pH: 5 (ref 5.0–8.0)

## 2017-01-23 LAB — CBC WITH DIFFERENTIAL/PLATELET
BASOS ABS: 0 10*3/uL (ref 0.0–0.1)
Basophils Relative: 0 %
Eosinophils Absolute: 0 10*3/uL (ref 0.0–0.7)
Eosinophils Relative: 0 %
HEMATOCRIT: 40 % (ref 36.0–46.0)
Hemoglobin: 13.2 g/dL (ref 12.0–15.0)
LYMPHS PCT: 13 %
Lymphs Abs: 1.3 10*3/uL (ref 0.7–4.0)
MCH: 30.1 pg (ref 26.0–34.0)
MCHC: 33 g/dL (ref 30.0–36.0)
MCV: 91.3 fL (ref 78.0–100.0)
Monocytes Absolute: 0.4 10*3/uL (ref 0.1–1.0)
Monocytes Relative: 4 %
NEUTROS ABS: 8.8 10*3/uL — AB (ref 1.7–7.7)
Neutrophils Relative %: 83 %
PLATELETS: 212 10*3/uL (ref 150–400)
RBC: 4.38 MIL/uL (ref 3.87–5.11)
RDW: 13.1 % (ref 11.5–15.5)
WBC: 10.5 10*3/uL (ref 4.0–10.5)

## 2017-01-23 LAB — LACTIC ACID, PLASMA: LACTIC ACID, VENOUS: 0.7 mmol/L (ref 0.5–1.9)

## 2017-01-23 LAB — PREGNANCY, URINE: PREG TEST UR: NEGATIVE

## 2017-01-23 LAB — LIPASE, BLOOD: Lipase: 22 U/L (ref 11–51)

## 2017-01-23 MED ORDER — HYDROMORPHONE HCL 1 MG/ML IJ SOLN
1.0000 mg | Freq: Once | INTRAMUSCULAR | Status: AC
  Administered 2017-01-23: 1 mg via INTRAVENOUS
  Filled 2017-01-23: qty 1

## 2017-01-23 MED ORDER — SODIUM CHLORIDE 0.9 % IV BOLUS (SEPSIS)
1000.0000 mL | Freq: Once | INTRAVENOUS | Status: AC
  Administered 2017-01-23: 1000 mL via INTRAVENOUS

## 2017-01-23 MED ORDER — SODIUM CHLORIDE 0.9 % IV SOLN
INTRAVENOUS | Status: DC
  Administered 2017-01-24: via INTRAVENOUS

## 2017-01-23 MED ORDER — ONDANSETRON HCL 4 MG/2ML IJ SOLN
4.0000 mg | Freq: Once | INTRAMUSCULAR | Status: AC
  Administered 2017-01-23: 4 mg via INTRAVENOUS
  Filled 2017-01-23: qty 2

## 2017-01-23 MED ORDER — IOPAMIDOL (ISOVUE-300) INJECTION 61%
100.0000 mL | Freq: Once | INTRAVENOUS | Status: AC | PRN
  Administered 2017-01-23: 100 mL via INTRAVENOUS

## 2017-01-23 NOTE — ED Notes (Signed)
Pt returned from ct,  

## 2017-01-23 NOTE — ED Notes (Signed)
Patient transported to CT 

## 2017-01-23 NOTE — ED Notes (Signed)
Pt c/o abd pain that became worse today with fever, chills, headache, denies any n/v/d, denies any urinary symptoms,

## 2017-01-23 NOTE — ED Notes (Signed)
ED Provider at bedside. 

## 2017-01-23 NOTE — ED Triage Notes (Addendum)
PT c/o RLQ abdominal pain with fever, chills, and decreased appetite that started yesterday evening. PT states she took 800 mg of ibuprofen x2 hours prior to ED arrival today. PT states had BM this am. PT also has an IUD in place for birth control.

## 2017-01-24 ENCOUNTER — Encounter (HOSPITAL_COMMUNITY): Payer: Self-pay | Admitting: Internal Medicine

## 2017-01-24 ENCOUNTER — Inpatient Hospital Stay (HOSPITAL_COMMUNITY): Payer: Self-pay

## 2017-01-24 DIAGNOSIS — R109 Unspecified abdominal pain: Secondary | ICD-10-CM | POA: Diagnosis present

## 2017-01-24 DIAGNOSIS — R739 Hyperglycemia, unspecified: Secondary | ICD-10-CM

## 2017-01-24 DIAGNOSIS — R509 Fever, unspecified: Secondary | ICD-10-CM

## 2017-01-24 LAB — TROPONIN I
Troponin I: <0.03 ng/mL (ref <0.03)
Troponin I: <0.03 ng/mL (ref <0.03)

## 2017-01-24 LAB — COMPREHENSIVE METABOLIC PANEL
ALBUMIN: 3.3 g/dL — AB (ref 3.5–5.0)
ALT: 43 U/L (ref 14–54)
ANION GAP: 7 (ref 5–15)
AST: 39 U/L (ref 15–41)
Alkaline Phosphatase: 52 U/L (ref 38–126)
BUN: 7 mg/dL (ref 6–20)
CO2: 25 mmol/L (ref 22–32)
Calcium: 7.8 mg/dL — ABNORMAL LOW (ref 8.9–10.3)
Chloride: 103 mmol/L (ref 101–111)
Creatinine, Ser: 0.73 mg/dL (ref 0.44–1.00)
GFR calc non Af Amer: >60 mL/min (ref >60)
GLUCOSE: 125 mg/dL — AB (ref 65–99)
POTASSIUM: 3.5 mmol/L (ref 3.5–5.1)
SODIUM: 135 mmol/L (ref 135–145)
TOTAL PROTEIN: 6.4 g/dL — AB (ref 6.5–8.1)
Total Bilirubin: 0.8 mg/dL (ref 0.3–1.2)

## 2017-01-24 LAB — ECHOCARDIOGRAM COMPLETE
Height: 63 in
Weight: 2472.68 oz

## 2017-01-24 LAB — CBC
HCT: 35.2 % — ABNORMAL LOW (ref 36.0–46.0)
Hemoglobin: 11.7 g/dL — ABNORMAL LOW (ref 12.0–15.0)
MCH: 30.2 pg (ref 26.0–34.0)
MCHC: 33.2 g/dL (ref 30.0–36.0)
MCV: 91 fL (ref 78.0–100.0)
PLATELETS: 194 10*3/uL (ref 150–400)
RBC: 3.87 MIL/uL (ref 3.87–5.11)
RDW: 13.2 % (ref 11.5–15.5)
WBC: 13.7 10*3/uL — ABNORMAL HIGH (ref 4.0–10.5)

## 2017-01-24 LAB — D-DIMER, QUANTITATIVE: D-Dimer, Quant: 0.44 ug/mL-FEU (ref 0.00–0.50)

## 2017-01-24 LAB — HEMOGLOBIN A1C
HEMOGLOBIN A1C: 5.4 % (ref 4.8–5.6)
MEAN PLASMA GLUCOSE: 108.28 mg/dL

## 2017-01-24 LAB — SEDIMENTATION RATE: SED RATE: 15 mm/h (ref 0–22)

## 2017-01-24 LAB — TSH: TSH: 0.355 u[IU]/mL (ref 0.350–4.500)

## 2017-01-24 MED ORDER — TECHNETIUM TC 99M MEBROFENIN IV KIT
5.0000 | PACK | Freq: Once | INTRAVENOUS | Status: AC | PRN
  Administered 2017-01-24: 5.3 via INTRAVENOUS

## 2017-01-24 MED ORDER — PANTOPRAZOLE SODIUM 40 MG IV SOLR
40.0000 mg | Freq: Two times a day (BID) | INTRAVENOUS | Status: DC
  Administered 2017-01-24 – 2017-01-25 (×4): 40 mg via INTRAVENOUS
  Filled 2017-01-24 (×4): qty 40

## 2017-01-24 MED ORDER — HYDROMORPHONE HCL 1 MG/ML IJ SOLN
1.0000 mg | INTRAMUSCULAR | Status: DC | PRN
  Administered 2017-01-24 – 2017-01-25 (×6): 1 mg via INTRAVENOUS
  Filled 2017-01-24 (×6): qty 1

## 2017-01-24 MED ORDER — SODIUM CHLORIDE 0.9 % IV SOLN
INTRAVENOUS | Status: DC
  Administered 2017-01-24: 02:00:00 via INTRAVENOUS

## 2017-01-24 MED ORDER — PIPERACILLIN-TAZOBACTAM 3.375 G IVPB 30 MIN
3.3750 g | Freq: Once | INTRAVENOUS | Status: AC
  Administered 2017-01-24: 3.375 g via INTRAVENOUS
  Filled 2017-01-24: qty 50

## 2017-01-24 MED ORDER — ONDANSETRON HCL 4 MG/2ML IJ SOLN: 4.0000 mg | Freq: Four times a day (QID) | INTRAMUSCULAR | Status: DC | PRN

## 2017-01-24 MED ORDER — PIPERACILLIN-TAZOBACTAM 3.375 G IVPB
3.3750 g | Freq: Three times a day (TID) | INTRAVENOUS | Status: DC
  Administered 2017-01-24 – 2017-01-25 (×3): 3.375 g via INTRAVENOUS
  Filled 2017-01-24 (×3): qty 50

## 2017-01-24 MED ORDER — HYDROMORPHONE HCL 1 MG/ML IJ SOLN
1.0000 mg | Freq: Once | INTRAMUSCULAR | Status: AC
  Administered 2017-01-24: 1 mg via INTRAVENOUS
  Filled 2017-01-24: qty 1

## 2017-01-24 MED ORDER — INFLUENZA VAC SPLIT QUAD 0.5 ML IM SUSY
0.5000 mL | PREFILLED_SYRINGE | INTRAMUSCULAR | Status: AC
  Administered 2017-01-25: 0.5 mL via INTRAMUSCULAR
  Filled 2017-01-24: qty 0.5

## 2017-01-24 MED ORDER — PIPERACILLIN-TAZOBACTAM 3.375 G IVPB
3.3750 g | Freq: Three times a day (TID) | INTRAVENOUS | Status: DC
  Administered 2017-01-24: 3.375 g via INTRAVENOUS
  Filled 2017-01-24: qty 50

## 2017-01-24 MED ORDER — ENOXAPARIN SODIUM 40 MG/0.4ML ~~LOC~~ SOLN
40.0000 mg | SUBCUTANEOUS | Status: DC
  Filled 2017-01-24 (×2): qty 0.4

## 2017-01-24 MED ORDER — HYDROMORPHONE HCL 1 MG/ML IJ SOLN
1.0000 mg | INTRAMUSCULAR | Status: DC | PRN
  Administered 2017-01-24: 1 mg via INTRAVENOUS
  Filled 2017-01-24: qty 1

## 2017-01-24 NOTE — Consult Note (Signed)
[facility] Consult  Reason for Consult: RUQ pain and fever, CT with thickening  Referring Physician: Dr. Roderic Palau   Chief Complaint    Abdominal Pain      Anita Davis is a 31 y.o. female.  HPI: Anita Davis is a 31 yo with history of Hep C who presented with RUQ pain and fevers yesterday evening to the ED.  On the CT scan she was seen to have no gallstones but possible thickening, and no other intraabdominal sources noted.  The pain started yesterday and has not improved. She points to her RUQ pain.  It is not associated with any nausea or vomiting, but she is having some fevers. She reports that she does not take ibuprofen or NSAIDs regularly but had to take 4- 871m Ibuprofen to break her fever.  [place] done this AM and shows no stones but some thickening and no Murphy's sign.    Past Medical History:  Diagnosis Date  . Hepatitis-C     Past Surgical History:  Procedure Laterality Date  . HEMORRHOID BANDING      Family History  Problem Relation Age of Onset  . Depression Mother   . Cervical cancer Mother   . Hypertension Father   . Diabetes Father     Social History  Substance Use Topics  . Smoking status: Current Every Day Smoker    Packs/day: 0.05    Types: Cigarettes  . Smokeless tobacco: Never Used  . Alcohol use No    Medications:  I have reviewed the patient's current medications. Scheduled: . enoxaparin (LOVENOX) injection  40 mg Subcutaneous Q24H  . [START ON 01/25/2017] Influenza vac split quadrivalent PF  0.5 mL Intramuscular Tomorrow-1000  . pantoprazole (PROTONIX) IV  40 mg Intravenous Q12H   Continuous: . sodium chloride Stopped (01/24/17 0141)  . sodium chloride 75 mL/hr at 01/24/17 0140  . piperacillin-tazobactam (ZOSYN)  IV 3.375 g (01/24/17 07096   PGEZ:MOQHUTMLYYTKP(DILAUDID) injection, ondansetron (ZOFRAN) IV    ROS:  A comprehensive review of systems was negative except for: Constitutional: positive for chills and  fevers Gastrointestinal: positive for abdominal pain  Blood pressure (!) 96/49, pulse 66, temperature 98.1 F (36.7 C), temperature source Oral, resp. rate 16, height _0  (1.6 m), weight 154 lb 8.7 oz (70.1 kg), SpO2 99 %. Physical Exam  Constitutional: She is oriented to person, place, and time and well-developed, well-nourished, and in no distress.  HENT:  Head: Normocephalic and atraumatic.  Eyes: Pupils are equal, round, and reactive to light.  Cardiovascular: Normal rate.   Pulmonary/Chest: Effort normal and breath sounds normal.  Abdominal: Soft. She exhibits no distension. There is tenderness. There is no rebound and no guarding.  Musculoskeletal: Normal range of motion.  Neurological: She is alert and oriented to person, place, and time. GCS score is 15.  Skin: Skin is warm and dry.  Psychiatric: Mood, memory, affect and judgment normal.    Results: Results for orders placed or performed during the hospital encounter of 01/23/17 (from the past 48 hour(s))  Urinalysis, Routine w reflex microscopic     Status: None   Collection Time: 01/23/17  5:30 PM  Result Value Ref Range   Color, Urine YELLOW YELLOW   APPearance CLEAR CLEAR   Specific Gravity, Urine 1.010 1.005 - 1.030   pH 5.0 5.0 - 8.0   Glucose, UA NEGATIVE NEGATIVE mg/dL   Hgb urine dipstick NEGATIVE NEGATIVE   Bilirubin Urine NEGATIVE NEGATIVE   Ketones, ur NEGATIVE NEGATIVE  mg/dL   Protein, ur NEGATIVE NEGATIVE mg/dL   Nitrite NEGATIVE NEGATIVE   Leukocytes, UA NEGATIVE NEGATIVE  Pregnancy, urine     Status: None   Collection Time: 01/23/17  5:30 PM  Result Value Ref Range   Preg Test, Ur NEGATIVE NEGATIVE  CBC with Differential     Status: Abnormal   Collection Time: 01/23/17  7:22 PM  Result Value Ref Range   WBC 10.5 4.0 - 10.5 K/uL   RBC 4.38 3.87 - 5.11 MIL/uL   Hemoglobin 13.2 12.0 - 15.0 g/dL   HCT 40.0 36.0 - 46.0 %   MCV 91.3 78.0 - 100.0 fL   MCH 30.1 26.0 - 34.0 pg   MCHC 33.0 30.0 - 36.0  g/dL   RDW 13.1 11.5 - 15.5 %   Platelets 212 150 - 400 K/uL   Neutrophils Relative % 83 %   Neutro Abs 8.8 (H) 1.7 - 7.7 K/uL   Lymphocytes Relative 13 %   Lymphs Abs 1.3 0.7 - 4.0 K/uL   Monocytes Relative 4 %   Monocytes Absolute 0.4 0.1 - 1.0 K/uL   Eosinophils Relative 0 %   Eosinophils Absolute 0.0 0.0 - 0.7 K/uL   Basophils Relative 0 %   Basophils Absolute 0.0 0.0 - 0.1 K/uL  Comprehensive metabolic panel     Status: Abnormal   Collection Time: 01/23/17  7:22 PM  Result Value Ref Range   Sodium 135 135 - 145 mmol/L   Potassium 3.9 3.5 - 5.1 mmol/L   Chloride 100 (L) 101 - 111 mmol/L   CO2 28 22 - 32 mmol/L   Glucose, Bld 112 (H) 65 - 99 mg/dL   BUN 8 6 - 20 mg/dL   Creatinine, Ser 0.65 0.44 - 1.00 mg/dL   Calcium 8.7 (L) 8.9 - 10.3 mg/dL   Total Protein 7.5 6.5 - 8.1 g/dL   Albumin 4.1 3.5 - 5.0 g/dL   AST 47 (H) 15 - 41 U/L   ALT 51 14 - 54 U/L   Alkaline Phosphatase 65 38 - 126 U/L   Total Bilirubin 0.5 0.3 - 1.2 mg/dL   GFR calc non Af Amer >60 >60 mL/min   GFR calc Af Amer >60 >60 mL/min    Comment: (NOTE) The eGFR has been calculated using the CKD EPI equation. This calculation has not been validated in all clinical situations. eGFR's persistently <60 mL/min signify possible Chronic Kidney Disease.    Anion gap 7 5 - 15  Lactic acid, plasma     Status: None   Collection Time: 01/23/17  7:22 PM  Result Value Ref Range   Lactic Acid, Venous 0.7 0.5 - 1.9 mmol/L  Lipase, blood     Status: None   Collection Time: 01/23/17  7:22 PM  Result Value Ref Range   Lipase 22 11 - 51 U/L  TSH     Status: None   Collection Time: 01/24/17  1:46 AM  Result Value Ref Range   TSH 0.355 0.350 - 4.500 uIU/mL    Comment: Performed by a 3rd Generation assay with a functional sensitivity of <=0.01 uIU/mL.  D-dimer, quantitative (not at [facility])     Status: None   Collection Time: 01/24/17  1:46 AM  Result Value Ref Range   D-Dimer, Quant 0.44 0.00 - 0.50 ug/mL-FEU     Comment: (NOTE) At the manufacturer cut-off of 0.50 ug/mL FEU, this assay has been documented to exclude PE with a sensitivity and negative predictive value of  97 to 99%.  At this time, this assay has not been approved by the FDA to exclude DVT/VTE. Results should be correlated with clinical presentation.   Comprehensive metabolic panel     Status: Abnormal   Collection Time: 01/24/17  1:46 AM  Result Value Ref Range   Sodium 135 135 - 145 mmol/L   Potassium 3.5 3.5 - 5.1 mmol/L   Chloride 103 101 - 111 mmol/L   CO2 25 22 - 32 mmol/L   Glucose, Bld 125 (H) 65 - 99 mg/dL   BUN 7 6 - 20 mg/dL   Creatinine, Ser 0.73 0.44 - 1.00 mg/dL   Calcium 7.8 (L) 8.9 - 10.3 mg/dL   Total Protein 6.4 (L) 6.5 - 8.1 g/dL   Albumin 3.3 (L) 3.5 - 5.0 g/dL   AST 39 15 - 41 U/L   ALT 43 14 - 54 U/L   Alkaline Phosphatase 52 38 - 126 U/L   Total Bilirubin 0.8 0.3 - 1.2 mg/dL   GFR calc non Af Amer >60 >60 mL/min   GFR calc Af Amer >60 >60 mL/min    Comment: (NOTE) The eGFR has been calculated using the CKD EPI equation. This calculation has not been validated in all clinical situations. eGFR's persistently <60 mL/min signify possible Chronic Kidney Disease.    Anion gap 7 5 - 15  CBC     Status: Abnormal   Collection Time: 01/24/17  1:46 AM  Result Value Ref Range   WBC 13.7 (H) 4.0 - 10.5 K/uL   RBC 3.87 3.87 - 5.11 MIL/uL   Hemoglobin 11.7 (L) 12.0 - 15.0 g/dL   HCT 35.2 (L) 36.0 - 46.0 %   MCV 91.0 78.0 - 100.0 fL   MCH 30.2 26.0 - 34.0 pg   MCHC 33.2 30.0 - 36.0 g/dL   RDW 13.2 11.5 - 15.5 %   Platelets 194 150 - 400 K/uL  Sedimentation rate     Status: None   Collection Time: 01/24/17  1:46 AM  Result Value Ref Range   Sed Rate 15 0 - 22 mm/hr  Troponin I     Status: None   Collection Time: 01/24/17  1:46 AM  Result Value Ref Range   Troponin I <0.03 <0.03 ng/mL  Troponin I (q 6hr x 3)     Status: None   Collection Time: 01/24/17  7:44 AM  Result Value Ref Range   Troponin I  <0.03 <0.03 ng/mL    Personally reviewed CT scan- Gallbladder slightly thickened but no stones, difficult to see appendix, but no obvious fluid or thickening in the area  Dg Chest 2 View  Result Date: 01/23/2017 CLINICAL DATA:  Fever and abdominal pain since yesterday. History of bronchitis. Current smoker. EXAM: CHEST  2 VIEW COMPARISON:  None. FINDINGS: The heart size and mediastinal contours are within normal limits. Both lungs are clear. The visualized skeletal structures are unremarkable. IMPRESSION: No active cardiopulmonary disease. Electronically Signed   By: Lucienne Capers M.D.   On: 01/23/2017 23:04   Ct Abdomen Pelvis W Contrast  Result Date: 01/23/2017 CLINICAL DATA:  Abdominal pain, worse today. Fever, chills, headache. EXAM: CT ABDOMEN AND PELVIS WITH CONTRAST TECHNIQUE: Multidetector CT imaging of the abdomen and pelvis was performed using the standard protocol following bolus administration of intravenous contrast. CONTRAST:  172m ISOVUE-300 IOPAMIDOL (ISOVUE-300) INJECTION 61% COMPARISON:  None. FINDINGS: Lower chest: Lung bases are clear. Hepatobiliary: Suggestion of wall thickening and edema of the gallbladder. No stones identified.  Can't exclude cholecystitis. Ultrasound may provide additional information. No significant bile duct dilatation. No focal liver lesions. Pancreas: Unremarkable. No pancreatic ductal dilatation or surrounding inflammatory changes. Spleen: Normal in size without focal abnormality. Adrenals/Urinary Tract: Adrenal glands are unremarkable. Kidneys are normal, without renal calculi, focal lesion, or hydronephrosis. Bladder is unremarkable. Stomach/Bowel: Stomach is within normal limits. Appendix is not identified. No evidence of bowel wall thickening, distention, or inflammatory changes. Vascular/Lymphatic: No significant vascular findings are present. No enlarged abdominal or pelvic lymph nodes. Reproductive: An intrauterine device is present centrally within  the uterus. Uterus and ovaries are not enlarged. Other: No free air or free fluid in the abdomen. Abdominal wall musculature appears intact. Musculoskeletal: No acute or significant osseous findings. IMPRESSION: 1. Suggestion of mild wall thickening of the gallbladder. Can't exclude cholecystitis. Consider ultrasound for further evaluation. 2. No evidence of bowel obstruction or inflammation. Appendix is not identified. 3. Intrauterine device is present. Electronically Signed   By: Lucienne Capers M.D.   On: 01/23/2017 23:19   US Abdomen Limited Ruq  Result Date: 01/24/2017 CLINICAL DATA:  Abdominal pain over the last 2 days. Gallbladder wall thickening questioned at CT. Hepatitis-C. EXAM: ULTRASOUND ABDOMEN LIMITED RIGHT UPPER QUADRANT COMPARISON:  CT 01/23/2017 FINDINGS: Gallbladder: No gallstones or sludge. Wall thickness 4.5 mm. Negative Murphy sign. Common bile duct: Diameter: 3.2 mm common normal Liver: No focal lesion identified. Within normal limits in parenchymal echogenicity. Portal vein is patent on color Doppler imaging with normal direction of blood flow towards the liver. IMPRESSION: No evidence of stone disease. Gallbladder wall slightly thick, 4.5 mm. No Murphy sign. This finding can be seen with hepatitis. Acalculous cholecystitis not excluded but not strongly favored. Electronically Signed   By: Nelson Chimes M.D.   On: 01/24/2017 10:05     Assessment & Plan:  Anita Davis is a 31 y.o. female with mostly RUQ pain fevers and leukocytosis. She had an [place] that demonstrated no gallstones and some thickening. She denies any symptoms of dysuria or frequency, and has no vaginal discharge etc.   -HIDA ordered  -NPO until after HIDA done -Will assess HIDA and determine if cholecystitis, would be rare for acalculous cholecystitis in this young person   All questions were answered to the satisfaction of the patient.    Anita Davis 01/24/2017, 10:55 AM

## 2017-01-24 NOTE — Progress Notes (Signed)
HIDA Negative. No indication for cholecystectomy. Unsure of fevers/ other source.   Algis Greenhouse, MD [facility] [address] [place], [place] [address] [phone] (office)

## 2017-01-24 NOTE — Progress Notes (Signed)
*  PRELIMINARY RESULTS* Echocardiogram 2D Echocardiogram has been performed.  Anita Davis 01/24/2017, 9:25 AM

## 2017-01-24 NOTE — ED Notes (Signed)
Pt and family at bedside updated on plan of care,  

## 2017-01-24 NOTE — Progress Notes (Signed)
Received verbal order from Dr. Henreitta Leber that the patient can eat a heart healthy diet after she has her HIDA scan.

## 2017-01-24 NOTE — H&P (Signed)
TRH H&P   Patient Demographics:    Anita Davis, is a 31 y.o. female  MRN: [id number]   DOB - [birth year]  Admit Date - 01/23/2017  Outpatient Primary MD for the patient is Patient, No Pcp Per   NONE  Referring MD/NP/PA: Aundria Rud  Outpatient Specialists:   Patient coming from:  home  Chief Complaint  Patient presents with  . Abdominal Pain      HPI:    Anita Davis  is a 31 y.o. female, w hepatitis C apparently c/o RUQ pain for 2 days,  Started 7pm yesterday.  Nothing appears to make it better or worse.  Slight gerd.  Denies n/v,. Diarrhea, brbpr, black stool, dysuria, hematuria.  Pt took ibuprofen, without relief.  Pt presented to ED due to pain.   In ED,  CXR negative, Ast 47, Alt 51, Alk phos 65, T. Bili 0.5  Glucose 112, Lipase 22.   CT scan abd/ pelvis=> IMPRESSION: 1. Suggestion of mild wall thickening of the gallbladder. Can't exclude cholecystitis. Consider ultrasound for further evaluation. 2. No evidence of bowel obstruction or inflammation. Appendix is not identified. 3. Intrauterine device is present.  ED contacted general surgery who requested medical admission and pt will be admitted for r/o cholecystitis.    Review of systems:    In addition to the HPI above,  No Headache, No changes with Vision or hearing, No problems swallowing food or Liquids, No Chest pain, Cough or Shortness of Breath, No Nausea or Vommitting, Bowel movements are regular, No Blood in stool or Urine, No dysuria, No new skin rashes or bruises, No new joints pains-aches,  No new weakness, tingling, numbness in any extremity, No recent weight gain or loss, No polyuria, polydypsia or polyphagia, No significant Mental Stressors.  A full 10 point Review of Systems was done, except as stated above, all other Review of Systems were negative.   With Past History of the  following :    Past Medical History:  Diagnosis Date  . Hepatitis-C       Past Surgical History:  Procedure Laterality Date  . HEMORRHOID BANDING        Social History:     Social History  Substance Use Topics  . Smoking status: Current Every Day Smoker    Packs/day: 0.05    Types: Cigarettes  . Smokeless tobacco: Never Used  . Alcohol use No     Lives - at home  Mobility - walks by self   Family History :     Family History  Problem Relation Age of Onset  . Depression Mother   . Cervical cancer Mother   . Hypertension Father   . Diabetes Father       Home Medications:   Prior to Admission medications   Medication Sig Start Date End Date Taking? Authorizing Provider  ibuprofen (ADVIL,MOTRIN) 200 MG tablet Take  400 mg by mouth every 6 (six) hours as needed for mild pain or moderate pain.   Yes [provider]  naproxen sodium (ALEVE) 220 MG tablet Take 440 mg by mouth daily as needed.   Yes [provider]     Allergies:    No Known Allergies   Physical Exam:   Vitals  Blood pressure (!) 92/58, pulse 85, temperature 98.1 F (36.7 C), temperature source Oral, resp. rate 16, height 5' 3"  (1.6 m), weight 65.8 kg (145 lb), SpO2 100 %.   1. General  lying in bed in NAD,   2. Normal affect and insight, Not Suicidal or Homicidal, Awake Alert, Oriented X 3.  3. No F.N deficits, ALL C.Nerves Intact, Strength 5/5 all 4 extremities, Sensation intact all 4 extremities, Plantars down going.  4. Ears and Eyes appear Normal, Conjunctivae clear, PERRLA. Moist Oral Mucosa.  5. Supple Neck, No JVD, No cervical lymphadenopathy appriciated, No Carotid Bruits.  6. Symmetrical Chest wall movement, Good air movement bilaterally, CTAB.  7. RRR, No Gallops, Rubs or Murmurs, No Parasternal Heave.  8. Positive Bowel Sounds, Abdomen Soft, No tenderness, No organomegaly appriciated,No rebound -guarding or rigidity.  9.  No Cyanosis, Normal Skin Turgor,  No Skin Rash or Bruise.  10. Good muscle tone,  joints appear normal , no effusions, Normal ROM.  11. No Palpable Lymph Nodes in Neck or Axillae     Data Review:    CBC  Recent Labs Lab 01/23/17 1922  WBC 10.5  HGB 13.2  HCT 40.0  PLT 212  MCV 91.3  MCH 30.1  MCHC 33.0  RDW 13.1  LYMPHSABS 1.3  MONOABS 0.4  EOSABS 0.0  BASOSABS 0.0   ------------------------------------------------------------------------------------------------------------------  Chemistries   Recent Labs Lab 01/23/17 1922  NA 135  K 3.9  CL 100*  CO2 28  GLUCOSE 112*  BUN 8  CREATININE 0.65  CALCIUM 8.7*  AST 47*  ALT 51  ALKPHOS 65  BILITOT 0.5   ------------------------------------------------------------------------------------------------------------------ estimated creatinine clearance is 91.3 mL/min (by C-G formula based on SCr of 0.65 mg/dL). ------------------------------------------------------------------------------------------------------------------ No results for input(s): TSH, T4TOTAL, T3FREE, THYROIDAB in the last 72 hours.  Invalid input(s): FREET3  Coagulation profile No results for input(s): INR, PROTIME in the last 168 hours. ------------------------------------------------------------------------------------------------------------------- No results for input(s): DDIMER in the last 72 hours. -------------------------------------------------------------------------------------------------------------------  Cardiac Enzymes No results for input(s): CKMB, TROPONINI, MYOGLOBIN in the last 168 hours.  Invalid input(s): CK ------------------------------------------------------------------------------------------------------------------ No results found for: BNP   ---------------------------------------------------------------------------------------------------------------  Urinalysis    Component Value Date/Time   COLORURINE YELLOW 01/23/2017 1730    APPEARANCEUR CLEAR 01/23/2017 1730   LABSPEC 1.010 01/23/2017 1730   PHURINE 5.0 01/23/2017 1730   GLUCOSEU NEGATIVE 01/23/2017 1730   HGBUR NEGATIVE 01/23/2017 1730   BILIRUBINUR NEGATIVE 01/23/2017 1730   KETONESUR NEGATIVE 01/23/2017 1730   PROTEINUR NEGATIVE 01/23/2017 1730   NITRITE NEGATIVE 01/23/2017 1730   LEUKOCYTESUR NEGATIVE 01/23/2017 1730    ----------------------------------------------------------------------------------------------------------------   Imaging Results:    Dg Chest 2 View  Result Date: 01/23/2017 CLINICAL DATA:  Fever and abdominal pain since yesterday. History of bronchitis. Current smoker. EXAM: CHEST  2 VIEW COMPARISON:  None. FINDINGS: The heart size and mediastinal contours are within normal limits. Both lungs are clear. The visualized skeletal structures are unremarkable. IMPRESSION: No active cardiopulmonary disease. Electronically Signed   By: Lucienne Capers M.D.   On: 01/23/2017 23:04   Ct Abdomen Pelvis W Contrast  Result Date: 01/23/2017 CLINICAL DATA:  Abdominal pain, worse today. Fever, chills, headache. EXAM: CT ABDOMEN AND PELVIS WITH CONTRAST TECHNIQUE: Multidetector CT imaging of the abdomen and pelvis was performed using the standard protocol following bolus administration of intravenous contrast. CONTRAST:  121m ISOVUE-300 IOPAMIDOL (ISOVUE-300) INJECTION 61% COMPARISON:  None. FINDINGS: Lower chest: Lung bases are clear. Hepatobiliary: Suggestion of wall thickening and edema of the gallbladder. No stones identified. Can't exclude cholecystitis. Ultrasound may provide additional information. No significant bile duct dilatation. No focal liver lesions. Pancreas: Unremarkable. No pancreatic ductal dilatation or surrounding inflammatory changes. Spleen: Normal in size without focal abnormality. Adrenals/Urinary Tract: Adrenal glands are unremarkable. Kidneys are normal, without renal calculi, focal lesion, or hydronephrosis. Bladder is  unremarkable. Stomach/Bowel: Stomach is within normal limits. Appendix is not identified. No evidence of bowel wall thickening, distention, or inflammatory changes. Vascular/Lymphatic: No significant vascular findings are present. No enlarged abdominal or pelvic lymph nodes. Reproductive: An intrauterine device is present centrally within the uterus. Uterus and ovaries are not enlarged. Other: No free air or free fluid in the abdomen. Abdominal wall musculature appears intact. Musculoskeletal: No acute or significant osseous findings. IMPRESSION: 1. Suggestion of mild wall thickening of the gallbladder. Can't exclude cholecystitis. Consider ultrasound for further evaluation. 2. No evidence of bowel obstruction or inflammation. Appendix is not identified. 3. Intrauterine device is present. Electronically Signed   By: WLucienne CapersM.D.   On: 01/23/2017 23:19     Assessment & Plan:    Principal Problem:   Abdominal pain Active Problems:   Hyperglycemia   Fever    Abdominal pain R/o cholecystitis RUQ ultrasound protonix 417miv bid NPO General surgery consulted, appreciate input  Fever Blood culture x2 HIV ESR  Tachycardia Likely secondary to fever Check tsh Trop I q6h x3 Check d dimer, if positive can order CTA chest r/o PE Check cardiac echo   Hyperglycemia Check hga1c,      DVT Prophylaxis  Lovenox - SCDs  AM Labs Ordered, also please review Full Orders  Family Communication: Admission, patients condition and plan of care including tests being ordered have been discussed with the patient  who indicate understanding and agree with the plan and Code Status.  Code Status FULL CODE  Likely DC to  home  Condition GUARDED   Consults called: general surgery by ED  Admission status: inpatient  Time spent in minutes : 45   JaJani Gravel.D on 01/24/2017 at 12:49 AM  Between 7am to 7pm - Pager - [phone] 7pm go to [email or web address] - password [email or web address] - Office  [phone]

## 2017-01-24 NOTE — ED Notes (Signed)
Dr Kim at bedside,  

## 2017-01-24 NOTE — ED Provider Notes (Signed)
[facility]-EMERGENCY DEPT Provider Note   CSN: [id number] Arrival date & time: 01/23/17  1717     History   Chief Complaint Chief Complaint  Patient presents with  . Abdominal Pain    HPI Anita Davis is a 31 y.o. female.  Patient with complaint of right upper quadrant abdominal pain started yesterday at 1900. Associated with fever and chills. No nausea or vomiting. Some bodyaches and a mild headache. Patient's had some vaginal bleeding on and off but this is not unusual for her. The abdominal pain is a new finding. Patient's past medical history significant for hepatitis C. Hemorrhoid banding. No other abdominal surgeries.      Past Medical History:  Diagnosis Date  . Hepatitis-C     There are no active problems to display for this patient.   Past Surgical History:  Procedure Laterality Date  . HEMORRHOID BANDING      OB History    Gravida Para Term Preterm AB Living             2   SAB TAB Ectopic Multiple Live Births                   Home Medications    Prior to Admission medications   Medication Sig Start Date End Date Taking? Authorizing Provider  ibuprofen (ADVIL,MOTRIN) 200 MG tablet Take 400 mg by mouth every 6 (six) hours as needed for mild pain or moderate pain.   Yes [provider]  naproxen sodium (ALEVE) 220 MG tablet Take 440 mg by mouth daily as needed.   Yes [provider]    Family History History reviewed. No pertinent family history.  Social History Social History  Substance Use Topics  . Smoking status: Current Every Day Smoker    Types: E-cigarettes  . Smokeless tobacco: Never Used  . Alcohol use No     Allergies   Patient has no known allergies.   Review of Systems Review of Systems  Constitutional: Positive for chills and fever.  HENT: Negative for congestion.   Eyes: Negative for redness.  Respiratory: Negative for shortness of breath.   Cardiovascular: Negative for chest pain.  Gastrointestinal:  Positive for abdominal pain. Negative for nausea and vomiting.  Genitourinary: Positive for vaginal bleeding. Negative for dysuria.  Musculoskeletal: Positive for myalgias.  Skin: Negative for rash.  Neurological: Positive for headaches.  Hematological: Does not bruise/bleed easily.  Psychiatric/Behavioral: Negative for confusion.     Physical Exam Updated Vital Signs BP (!) 92/58   Pulse 85   Temp 98.1 F (36.7 C) (Oral)   Resp 16   Ht 1.6 m ( )   Wt 65.8 kg (145 lb)   SpO2 100%   BMI 25.69 kg/m   Physical Exam  Constitutional: She is oriented to person, place, and time. She appears well-developed and well-nourished. No distress.  HENT:  Head: Normocephalic and atraumatic.  Mouth/Throat: Oropharynx is clear and moist.  Eyes: Pupils are equal, round, and reactive to light. Conjunctivae and EOM are normal.  Neck: Normal range of motion. Neck supple.  Cardiovascular: Regular rhythm and normal heart sounds.   Tachycardic  Pulmonary/Chest: Effort normal and breath sounds normal.  Abdominal: Soft. Bowel sounds are normal. There is tenderness. There is guarding.  Right upper quadrant tenderness and guarding  Musculoskeletal: Normal range of motion.  Neurological: She is alert and oriented to person, place, and time. No cranial nerve deficit.  Skin: Skin is warm.  Nursing note and vitals reviewed.  ED Treatments / Results  Labs (all labs ordered are listed, but only abnormal results are displayed) Labs Reviewed  CBC WITH DIFFERENTIAL/PLATELET - Abnormal; Notable for the following:       Result Value   Neutro Abs 8.8 (*)    All other components within normal limits  COMPREHENSIVE METABOLIC PANEL - Abnormal; Notable for the following:    Chloride 100 (*)    Glucose, Bld 112 (*)    Calcium 8.7 (*)    AST 47 (*)    All other components within normal limits  URINALYSIS, ROUTINE W REFLEX MICROSCOPIC  PREGNANCY, URINE  LACTIC ACID, PLASMA  LIPASE, BLOOD   Results  for orders placed or performed during the hospital encounter of 01/23/17  CBC with Differential  Result Value Ref Range   WBC 10.5 4.0 - 10.5 K/uL   RBC 4.38 3.87 - 5.11 MIL/uL   Hemoglobin 13.2 12.0 - 15.0 g/dL   HCT 16.1 09.6 - 04.5 %   MCV 91.3 78.0 - 100.0 fL   MCH 30.1 26.0 - 34.0 pg   MCHC 33.0 30.0 - 36.0 g/dL   RDW 40.9 81.1 - 91.4 %   Platelets 212 150 - 400 K/uL   Neutrophils Relative % 83 %   Neutro Abs 8.8 (H) 1.7 - 7.7 K/uL   Lymphocytes Relative 13 %   Lymphs Abs 1.3 0.7 - 4.0 K/uL   Monocytes Relative 4 %   Monocytes Absolute 0.4 0.1 - 1.0 K/uL   Eosinophils Relative 0 %   Eosinophils Absolute 0.0 0.0 - 0.7 K/uL   Basophils Relative 0 %   Basophils Absolute 0.0 0.0 - 0.1 K/uL  Comprehensive metabolic panel  Result Value Ref Range   Sodium 135 135 - 145 mmol/L   Potassium 3.9 3.5 - 5.1 mmol/L   Chloride 100 (L) 101 - 111 mmol/L   CO2 28 22 - 32 mmol/L   Glucose, Bld 112 (H) 65 - 99 mg/dL   BUN 8 6 - 20 mg/dL   Creatinine, Ser 7.82 0.44 - 1.00 mg/dL   Calcium 8.7 (L) 8.9 - 10.3 mg/dL   Total Protein 7.5 6.5 - 8.1 g/dL   Albumin 4.1 3.5 - 5.0 g/dL   AST 47 (H) 15 - 41 U/L   ALT 51 14 - 54 U/L   Alkaline Phosphatase 65 38 - 126 U/L   Total Bilirubin 0.5 0.3 - 1.2 mg/dL   GFR calc non Af Amer >60 >60 mL/min   GFR calc Af Amer >60 >60 mL/min   Anion gap 7 5 - 15  Urinalysis, Routine w reflex microscopic  Result Value Ref Range   Color, Urine YELLOW YELLOW   APPearance CLEAR CLEAR   Specific Gravity, Urine 1.010 1.005 - 1.030   pH 5.0 5.0 - 8.0   Glucose, UA NEGATIVE NEGATIVE mg/dL   Hgb urine dipstick NEGATIVE NEGATIVE   Bilirubin Urine NEGATIVE NEGATIVE   Ketones, ur NEGATIVE NEGATIVE mg/dL   Protein, ur NEGATIVE NEGATIVE mg/dL   Nitrite NEGATIVE NEGATIVE   Leukocytes, UA NEGATIVE NEGATIVE  Pregnancy, urine  Result Value Ref Range   Preg Test, Ur NEGATIVE NEGATIVE  Lactic acid, plasma  Result Value Ref Range   Lactic Acid, Venous 0.7 0.5 - 1.9  mmol/L  Lipase, blood  Result Value Ref Range   Lipase 22 11 - 51 U/L   Dg Chest 2 View  Result Date: 01/23/2017 CLINICAL DATA:  Fever and abdominal pain since yesterday. History of bronchitis. Current smoker.  EXAM: CHEST  2 VIEW COMPARISON:  None. FINDINGS: The heart size and mediastinal contours are within normal limits. Both lungs are clear. The visualized skeletal structures are unremarkable. IMPRESSION: No active cardiopulmonary disease. Electronically Signed   By: Burman Nieves M.D.   On: 01/23/2017 23:04   Ct Abdomen Pelvis W Contrast  Result Date: 01/23/2017 CLINICAL DATA:  Abdominal pain, worse today. Fever, chills, headache. EXAM: CT ABDOMEN AND PELVIS WITH CONTRAST TECHNIQUE: Multidetector CT imaging of the abdomen and pelvis was performed using the standard protocol following bolus administration of intravenous contrast. CONTRAST:  ISOVUE-300 IOPAMIDOL (ISOVUE-300) INJECTION 61% COMPARISON:  None. FINDINGS: Lower chest: Lung bases are clear. Hepatobiliary: Suggestion of wall thickening and edema of the gallbladder. No stones identified. Can't exclude cholecystitis. Ultrasound may provide additional information. No significant bile duct dilatation. No focal liver lesions. Pancreas: Unremarkable. No pancreatic ductal dilatation or surrounding inflammatory changes. Spleen: Normal in size without focal abnormality. Adrenals/Urinary Tract: Adrenal glands are unremarkable. Kidneys are normal, without renal calculi, focal lesion, or hydronephrosis. Bladder is unremarkable. Stomach/Bowel: Stomach is within normal limits. Appendix is not identified. No evidence of bowel wall thickening, distention, or inflammatory changes. Vascular/Lymphatic: No significant vascular findings are present. No enlarged abdominal or pelvic lymph nodes. Reproductive: An intrauterine device is present centrally within the uterus. Uterus and ovaries are not enlarged. Other: No free air or free fluid in the abdomen.  Abdominal wall musculature appears intact. Musculoskeletal: No acute or significant osseous findings. IMPRESSION: 1. Suggestion of mild wall thickening of the gallbladder. Can't exclude cholecystitis. Consider ultrasound for further evaluation. 2. No evidence of bowel obstruction or inflammation. Appendix is not identified. 3. Intrauterine device is present. Electronically Signed   By: Burman Nieves M.D.   On: 01/23/2017 23:19    EKG  EKG Interpretation None       Radiology Dg Chest 2 View  Result Date: 01/23/2017 CLINICAL DATA:  Fever and abdominal pain since yesterday. History of bronchitis. Current smoker. EXAM: CHEST  2 VIEW COMPARISON:  None. FINDINGS: The heart size and mediastinal contours are within normal limits. Both lungs are clear. The visualized skeletal structures are unremarkable. IMPRESSION: No active cardiopulmonary disease. Electronically Signed   By: Burman Nieves M.D.   On: 01/23/2017 23:04   Ct Abdomen Pelvis W Contrast  Result Date: 01/23/2017 CLINICAL DATA:  Abdominal pain, worse today. Fever, chills, headache. EXAM: CT ABDOMEN AND PELVIS WITH CONTRAST TECHNIQUE: Multidetector CT imaging of the abdomen and pelvis was performed using the standard protocol following bolus administration of intravenous contrast. CONTRAST:  ISOVUE-300 IOPAMIDOL (ISOVUE-300) INJECTION 61% COMPARISON:  None. FINDINGS: Lower chest: Lung bases are clear. Hepatobiliary: Suggestion of wall thickening and edema of the gallbladder. No stones identified. Can't exclude cholecystitis. Ultrasound may provide additional information. No significant bile duct dilatation. No focal liver lesions. Pancreas: Unremarkable. No pancreatic ductal dilatation or surrounding inflammatory changes. Spleen: Normal in size without focal abnormality. Adrenals/Urinary Tract: Adrenal glands are unremarkable. Kidneys are normal, without renal calculi, focal lesion, or hydronephrosis. Bladder is unremarkable.  Stomach/Bowel: Stomach is within normal limits. Appendix is not identified. No evidence of bowel wall thickening, distention, or inflammatory changes. Vascular/Lymphatic: No significant vascular findings are present. No enlarged abdominal or pelvic lymph nodes. Reproductive: An intrauterine device is present centrally within the uterus. Uterus and ovaries are not enlarged. Other: No free air or free fluid in the abdomen. Abdominal wall musculature appears intact. Musculoskeletal: No acute or significant osseous findings. IMPRESSION: 1. Suggestion of mild wall  thickening of the gallbladder. Can't exclude cholecystitis. Consider ultrasound for further evaluation. 2. No evidence of bowel obstruction or inflammation. Appendix is not identified. 3. Intrauterine device is present. Electronically Signed   By: Burman Nieves M.D.   On: 01/23/2017 23:19    Procedures Procedures (including critical care time)  Medications Ordered in ED Medications  0.9 %  sodium chloride infusion (not administered)  HYDROmorphone (DILAUDID) injection 1 mg (not administered)  piperacillin-tazobactam (ZOSYN) IVPB 3.375 g (not administered)  sodium chloride 0.9 % bolus 1,000 mL (0 mLs Intravenous Stopped 01/23/17 2239)  ondansetron (ZOFRAN) injection 4 mg (4 mg Intravenous Given 01/23/17 2204)  HYDROmorphone (DILAUDID) injection 1 mg (1 mg Intravenous Given 01/23/17 2204)  iopamidol (ISOVUE-300) 61 % injection 100 mL (100 mLs Intravenous Contrast Given 01/23/17 2248)     Initial Impression / Assessment and Plan / ED Course  I have reviewed the triage vital signs and the nursing notes.  Pertinent labs & imaging results that were available during my care of the patient were reviewed by me and considered in my medical decision making (see chart for details).      Discussed with Dr. Henreitta Leber on-call for general surgery. Clinically she agrees may very well be acute cholecystitis even though CT scan does not show any gallstones.  She recommended Zosyn recommends internal medicine admission plan on ultrasound in the morning. Keep patient nothing by mouth continue IV fluids.  Patient was febrile and slightly tachycardic lactic acid is normal. Not hypotensive no direct signs of sepsis. Patient with persistent right upper quadrant abdominal pain that started yesterday. No marked leukocytosis. Pregnancies negative. Chest x-ray negative. Patient has some vaginal bleeding but no vaginal discharge. CT scan showed swelling and thickening of the gallbladder. Common bile duct was normal.  Patient's liver function tests without elevation in bilirubin or alkaline phosphatase also lipase is normal. No leukocytosis.   We'll discuss with hospitalist for admission.  Final Clinical Impressions(s) / ED Diagnoses   Final diagnoses:  Right upper quadrant abdominal pain  Acute cholecystitis    New Prescriptions New Prescriptions   No medications on file     Vanetta Mulders, MD 01/24/17 [phone]

## 2017-01-24 NOTE — Progress Notes (Signed)
Patient admitted to the hospital earlier this morning by Dr. Selena Batten.  Patient seen and examined. Appears to be resting on my arrival, reports that she is having continued abdominal pain. She's been started on a diet but has had minimal by mouth intake. She has not had any vomiting. No diarrhea. She reports that prior to arrival to the emergency room she was having high fevers and was taking ibuprofen for this. She also describes transient myalgias and rash over her abdomen which have since resolved. She continues to have abdominal pain. CT scan of the abdomen and pelvis indicated possible cholecystitis, but HIDA scan did not indicate any issues with the gallbladder. She was seen by general surgery who feels that cholecystitis is unlikely in this situation. Question underlying viral illness. We'll continue supportive treatment for now. Will request GI input to see if endoscopy is necessary since she has been taking NSAIDs and continues to have abdominal pain. She is on PPI. She was apparently started on Zosyn in the emergency room. She's not had any further fevers. Would have low threshold to discontinue.  Anita Davis,Anita Davis

## 2017-01-24 NOTE — Progress Notes (Signed)
Pharmacy Antibiotic Note  Anita Davis is a 31 y.o. female admitted on 01/23/2017 with intra-abdominal infection.Marland Kitchen  Pharmacy has been consulted for zosyn dosing.  Plan: Zosyn 3.375g IV q8h (4 hour infusion).  F/U cxs and clinical progress  Height:  (160 cm) Weight: 154 lb 8.7 oz (70.1 kg) IBW/kg (Calculated) : 52.4  Temp (24hrs), Avg:98.8 F (37.1 C), Min:98.1 F (36.7 C), Max:100.4 F (38 C)   Recent Labs Lab 01/23/17 1922 01/24/17 0146  WBC 10.5 13.7*  CREATININE 0.65 0.73  LATICACIDVEN 0.7  --     Estimated Creatinine Clearance: 94 mL/min (by C-G formula based on SCr of 0.73 mg/dL).    No Known Allergies  Antimicrobials this admission: Zosyn 10/4 >>   Thank you for allowing pharmacy to be a part of this patient's care.  Elder Cyphers, BS Pharm D, [place] Clinical Pharmacist Pager [phone] 01/24/2017 8:27 AM

## 2017-01-25 DIAGNOSIS — R1011 Right upper quadrant pain: Secondary | ICD-10-CM

## 2017-01-25 DIAGNOSIS — D649 Anemia, unspecified: Secondary | ICD-10-CM

## 2017-01-25 DIAGNOSIS — R509 Fever, unspecified: Secondary | ICD-10-CM

## 2017-01-25 DIAGNOSIS — K810 Acute cholecystitis: Secondary | ICD-10-CM

## 2017-01-25 LAB — CBC
HCT: 34.6 % — ABNORMAL LOW (ref 36.0–46.0)
Hemoglobin: 11.6 g/dL — ABNORMAL LOW (ref 12.0–15.0)
MCH: 30.6 pg (ref 26.0–34.0)
MCHC: 33.5 g/dL (ref 30.0–36.0)
MCV: 91.3 fL (ref 78.0–100.0)
PLATELETS: 183 10*3/uL (ref 150–400)
RBC: 3.79 MIL/uL — AB (ref 3.87–5.11)
RDW: 13.2 % (ref 11.5–15.5)
WBC: 5.3 10*3/uL (ref 4.0–10.5)

## 2017-01-25 LAB — COMPREHENSIVE METABOLIC PANEL
ALT: 61 U/L — AB (ref 14–54)
AST: 79 U/L — AB (ref 15–41)
Albumin: 3 g/dL — ABNORMAL LOW (ref 3.5–5.0)
Alkaline Phosphatase: 48 U/L (ref 38–126)
Anion gap: 8 (ref 5–15)
BUN: 9 mg/dL (ref 6–20)
CHLORIDE: 104 mmol/L (ref 101–111)
CO2: 26 mmol/L (ref 22–32)
CREATININE: 0.7 mg/dL (ref 0.44–1.00)
Calcium: 8.1 mg/dL — ABNORMAL LOW (ref 8.9–10.3)
GFR calc non Af Amer: >60 mL/min (ref >60)
Glucose, Bld: 84 mg/dL (ref 65–99)
Potassium: 3.6 mmol/L (ref 3.5–5.1)
Sodium: 138 mmol/L (ref 135–145)
Total Bilirubin: 0.5 mg/dL (ref 0.3–1.2)
Total Protein: 6 g/dL — ABNORMAL LOW (ref 6.5–8.1)

## 2017-01-25 LAB — HIV ANTIBODY (ROUTINE TESTING W REFLEX): HIV SCREEN 4TH GENERATION: NONREACTIVE

## 2017-01-25 NOTE — Progress Notes (Signed)
Patient insisting to leave AMA. MD notified. IV and telemetry removed, WNL. Patient verbalized understanding of the risks of leaving AMA. AMA form signed and placed on shadow chart.  Quita Skye, RN

## 2017-01-25 NOTE — Consult Note (Signed)
Referring Provider: [facility] Primary Care Physician:  Patient, No Pcp Per Primary Gastroenterologist:  Dr. Gala Romney (previously unassigned)  Date of Admission: 01/23/17 Date of Consultation: 01/25/17  Reason for Consultation:  Abdominal pain  HPI:  Anita Davis is a 31 y.o. female with a past medical history of Hepatitis C and hemorrhoids. She presented to the ER for RUQ pain for 2 days without trigger or alleviating factors, some minimal GERD symptoms. Denied N/V, diarrhea, hematochezia, and melena. Took Ibuprofen for a few doses due to myalgias and low grade fever. Question of cholecystitis on imaging but HIDA was normal. Surgery consulted and felt unlikely cholecystitis. She admitted to four Ibuprofen 800 mg for fever otherwise no regular NSAIDs. She also admitted a transient rash. Suspected viral syndrome initially. Her rash and fevers resolved but continued with abdominal pain. On PPI already. Started on Zosyn.  Labs in the ER show normal TSH, D-Dimer, negative HIV, CMP with essentially normal LFTs, CBC with drop in hgb from 13.2 to 11.7, mild leucocytosis at 13.7. Normal ESR and troponins.  Hgb today stable at 11.6. Still below previously normal baseline as recent as 2 days ago.  Today she confirms the above information. Still with generalized abdominal pain described as "like my intestines are twisting." Denies hematochezia, melena. Discussed HCV status and states she has not sought treatment due to no insurance and her work schedule. No N/V. States she wants to go home. No other GI complaints.  Past Medical History:  Diagnosis Date  . Hepatitis-C     Past Surgical History:  Procedure Laterality Date  . HEMORRHOID BANDING      Prior to Admission medications   Medication Sig Start Date End Date Taking? Authorizing Provider  ibuprofen (ADVIL,MOTRIN) 200 MG tablet Take 400 mg by mouth every 6 (six) hours as needed for mild pain or moderate pain.   Yes [provider]  naproxen sodium (ALEVE) 220 MG tablet Take 440 mg by mouth daily as needed.   Yes [provider]    Current Facility-Administered Medications  Medication Dose Route Frequency Provider Last Rate Last Dose  . enoxaparin (LOVENOX) injection 40 mg  40 mg Subcutaneous Q24H Jani Gravel, MD      . HYDROmorphone (DILAUDID) injection 1 mg  1 mg Intravenous Q3H PRN Kathie Dike, MD   1 mg at 01/25/17 0841  . Influenza vac split quadrivalent PF (FLUARIX) injection 0.5 mL  0.5 mL Intramuscular Tomorrow-1000 Jani Gravel, MD      . ondansetron [facility]) injection 4 mg  4 mg Intravenous Q6H PRN Jani Gravel, MD      . pantoprazole (PROTONIX) injection 40 mg  40 mg Intravenous Marjean [name], MD   40 mg at 01/24/17 2114  . piperacillin-tazobactam (ZOSYN) IVPB 3.375 g  3.375 g Intravenous Q8H Memon, Jolaine Artist, MD 12.5 mL/hr at 01/25/17 0754 3.375 g at 01/25/17 0754    Allergies as of 01/23/2017  . (No Known Allergies)    Family History  Problem Relation Age of Onset  . Depression Mother   . Cervical cancer Mother   . Hypertension Father   . Diabetes Father     Social History   Social History  . Marital status: Single    Spouse name: N/A  . Number of children: N/A  . Years of education: N/A   Occupational History  . Not on file.   Social History Main Topics  . Smoking status: Current Every Day Smoker    Packs/day: 0.05  Types: Cigarettes  . Smokeless tobacco: Never Used  . Alcohol use No  . Drug use: No  . Sexual activity: Not on file   Other Topics Concern  . Not on file   Social History Narrative  . No narrative on file    Review of Systems: General: Negative for anorexia, weight loss, fever, chills, fatigue, weakness. Eyes: Negative for vision changes.  ENT: Negative for hoarseness, difficulty swallowing , nasal congestion. CV: Negative for chest pain, angina, palpitations, dyspnea on exertion, peripheral edema.  Respiratory: Negative for dyspnea at rest,  dyspnea on exertion, cough, sputum, wheezing.  GI: See history of present illness. GU:  Negative for dysuria, hematuria, urinary incontinence, urinary frequency, nocturnal urination.  MS: Negative for joint pain, low back pain.  Derm: Negative for rash or itching.  Neuro: Negative for weakness, abnormal sensation, seizure, frequent headaches, memory loss, confusion.  Psych: Negative for anxiety, depression, suicidal ideation, hallucinations.  Endo: Negative for unusual weight change.  Heme: Negative for bruising or bleeding. Allergy: Negative for rash or hives.  Physical Exam: Vital signs in last 24 hours: Temp:  [98.2 F (36.8 C)-98.7 F (37.1 C)] 98.2 F (36.8 C) (10/05 0600) Pulse Rate:  [58-66] 58 (10/05 0600) Resp:  [15-16] 15 (10/04 2335) BP: (105-114)/(49-56) 111/56 (10/05 0600) SpO2:  [96 %-99 %] 99 % (10/05 0600) Weight:  [148 lb 5.9 oz (67.3 kg)] 148 lb 5.9 oz (67.3 kg) (10/05 0600) Last BM Date: 01/23/17 General:   Alert,  Well-developed, well-nourished, pleasant and cooperative in NAD Head:  Normocephalic and atraumatic. Eyes:  Sclera clear, no icterus.   Conjunctiva pink. Ears:  Normal auditory acuity. Nose:  No deformity, discharge,  or lesions. Mouth:  No deformity or lesions, dentition normal. Neck:  Supple; no masses or thyromegaly. Lungs:  Clear throughout to auscultation.   No wheezes, crackles, or rhonchi. No acute distress. Heart:  Regular rate and rhythm; no murmurs, clicks, rubs,  or gallops. Abdomen:  Soft, nontender and nondistended. No masses, hepatosplenomegaly or hernias noted. Normal bowel sounds, without guarding, and without rebound.   Rectal:  Deferred until time of colonoscopy.   Msk:  Symmetrical without gross deformities. Normal posture. Pulses:  Normal pulses noted. Extremities:  Without clubbing or edema. Neurologic:  Alert and  oriented x4;  grossly normal neurologically. Skin:  Intact without significant lesions or rashes. Cervical Nodes:   No significant cervical adenopathy. Psych:  Alert and cooperative. Normal mood and affect.  Intake/Output from previous day: 10/04 0701 - 10/05 0700 In: 1592.5 [P.O.:480; I.V.:1012.5; IV Piggyback:100] Out: 0370 [Urine:1650] Intake/Output this shift: No intake/output data recorded.  Lab Results:  Recent Labs  01/23/17 1922 01/24/17 0146 01/25/17 0431  WBC 10.5 13.7* 5.3  HGB 13.2 11.7* 11.6*  HCT 40.0 35.2* 34.6*  PLT 212 194 183   BMET  Recent Labs  01/23/17 1922 01/24/17 0146 01/25/17 0431  NA 135 135 138  K 3.9 3.5 3.6  CL 100* 103 104  CO2 _0 GLUCOSE 112* 125* 84  BUN _1 CREATININE 0.65 0.73 0.70  CALCIUM 8.7* 7.8* 8.1*   LFT  Recent Labs  01/23/17 1922 01/24/17 0146 01/25/17 0431  PROT 7.5 6.4* 6.0*  ALBUMIN 4.1 3.3* 3.0*  AST 47* 39 79*  ALT 51 43 61*  ALKPHOS 65 52 48  BILITOT 0.5 0.8 0.5   PT/INR No results for input(s): LABPROT, INR in the last 72 hours. Hepatitis Panel No results for input(s): HEPBSAG, HCVAB, [place], HEPBIGM  in the last 72 hours. C-Diff No results for input(s): CDIFFTOX in the last 72 hours.  Studies/Results: Dg Chest 2 View  Result Date: 01/23/2017 CLINICAL DATA:  Fever and abdominal pain since yesterday. History of bronchitis. Current smoker. EXAM: CHEST  2 VIEW COMPARISON:  None. FINDINGS: The heart size and mediastinal contours are within normal limits. Both lungs are clear. The visualized skeletal structures are unremarkable. IMPRESSION: No active cardiopulmonary disease. Electronically Signed   By: Lucienne Capers M.D.   On: 01/23/2017 23:04   Ct Abdomen Pelvis W Contrast  Result Date: 01/23/2017 CLINICAL DATA:  Abdominal pain, worse today. Fever, chills, headache. EXAM: CT ABDOMEN AND PELVIS WITH CONTRAST TECHNIQUE: Multidetector CT imaging of the abdomen and pelvis was performed using the standard protocol following bolus administration of intravenous contrast. CONTRAST:  163m ISOVUE-300 IOPAMIDOL  (ISOVUE-300) INJECTION 61% COMPARISON:  None. FINDINGS: Lower chest: Lung bases are clear. Hepatobiliary: Suggestion of wall thickening and edema of the gallbladder. No stones identified. Can't exclude cholecystitis. Ultrasound may provide additional information. No significant bile duct dilatation. No focal liver lesions. Pancreas: Unremarkable. No pancreatic ductal dilatation or surrounding inflammatory changes. Spleen: Normal in size without focal abnormality. Adrenals/Urinary Tract: Adrenal glands are unremarkable. Kidneys are normal, without renal calculi, focal lesion, or hydronephrosis. Bladder is unremarkable. Stomach/Bowel: Stomach is within normal limits. Appendix is not identified. No evidence of bowel wall thickening, distention, or inflammatory changes. Vascular/Lymphatic: No significant vascular findings are present. No enlarged abdominal or pelvic lymph nodes. Reproductive: An intrauterine device is present centrally within the uterus. Uterus and ovaries are not enlarged. Other: No free air or free fluid in the abdomen. Abdominal wall musculature appears intact. Musculoskeletal: No acute or significant osseous findings. IMPRESSION: 1. Suggestion of mild wall thickening of the gallbladder. Can't exclude cholecystitis. Consider ultrasound for further evaluation. 2. No evidence of bowel obstruction or inflammation. Appendix is not identified. 3. Intrauterine device is present. Electronically Signed   By: WLucienne CapersM.D.   On: 01/23/2017 23:19   Nm Hepato W/eject Fract  Result Date: 01/24/2017 CLINICAL DATA:  Abdominal pain.  Abnormal ultrasound EXAM: NUCLEAR MEDICINE HEPATOBILIARY IMAGING WITH GALLBLADDER EF VIEWS: Anterior right upper quadrant RADIOPHARMACEUTICALS:  5.3 mCi Tc-969mCholetec IV COMPARISON:  Ultrasound right upper quadrant January 24, 2017 FINDINGS: Liver uptake of radiotracer is normal. There is prompt visualization of gallbladder and small bowel, indicating patency of the  cystic and common bile ducts. The patient consumed 8 ounces of Ensure Plus orally with calculation of the computer generated ejection fraction of radiotracer from the gallbladder. The patient did experience nausea and abdominal pain with the oral Ensure Plus consumption. The computer generated ejection fraction of radiotracer from the gallbladder is normal at 93%, normal greater than 33% using the oral agent. IMPRESSION: Normal ejection fraction of radiotracer from the gallbladder. The patient did experience nausea and abdominal pain with the oral Ensure Plus consumption. Cystic and common bile ducts are patent as is evidenced by visualization of gallbladder and small bowel. Electronically Signed   By: WiLowella GripII M.D.   On: 01/24/2017 16:36   UsKoreabdomen Limited Ruq  Result Date: 01/24/2017 CLINICAL DATA:  Abdominal pain over the last 2 days. Gallbladder wall thickening questioned at CT. Hepatitis-C. EXAM: ULTRASOUND ABDOMEN LIMITED RIGHT UPPER QUADRANT COMPARISON:  CT 01/23/2017 FINDINGS: Gallbladder: No gallstones or sludge. Wall thickness 4.5 mm. Negative Murphy sign. Common bile duct: Diameter: 3.2 mm common normal Liver: No focal lesion identified. Within normal limits in parenchymal echogenicity.  Portal vein is patent on color Doppler imaging with normal direction of blood flow towards the liver. IMPRESSION: No evidence of stone disease. Gallbladder wall slightly thick, 4.5 mm. No Murphy sign. This finding can be seen with hepatitis. Acalculous cholecystitis not excluded but not strongly favored. Electronically Signed   By: Nelson Chimes M.D.   On: 01/24/2017 10:05    Impression: 31 year old female presented with complaints of fever, rash, abdominal pain. Fever and rash have resolved. Persistent abdominal pain, like a "twisting of intestines" sensation. Denies overt GI bleeding. History of HCV without treatment. Unknown status of any liver fibrosis. Admits to taking four 866m Ibuprofen for  fever. Some GER symptoms.  She is anemia on serology with a decline in hgb from 13.2 two days ago to 11.7 yesterday and now 11.6. Possibly non-obvious GI bleed with differentials of esophagitis, gastritis, gastric erosions, gastric ulcers, duodenitis, duodenal ulcer, duodinal erosions in the setting of limited but large dose of NSAID.   As an aside I discussed options for HCV treatment as an outpatient with [facility] financial assistance and pharmaceutical company patient assistance. She seems resistant to this.  She was offered EGD under propofol/MAC for evaluation of possible UGI bleed as a cause of her anemia. She seems resistant to this as well.  Other differentials for her symptoms are acute viral illness with persistent abdominal pain, however no diarrhea.  Plan: 1. Consider EGD (offered to patient) 2. Monitor for any obvious GI bleed 3. Monitor H/H 4. Continue supportive care for likely viral illness 5. PPI 6. Offered outpatient follow-up to discuss HCV treatment options.   Thank you for allowing [facility] participate in the care of Anita Davis  EWalden Field DNP, AGNP-C Adult & Gerontological Nurse Practitioner [facility]    LOS: 1 day     01/25/2017, 9:40 AM

## 2017-01-25 NOTE — Discharge Summary (Signed)
Physician Discharge Summary  Maciah Feeback BJY:[id number] DOB: [birth year] DOA: 01/23/2017  PCP: Patient, No Pcp Per  Admit date: 01/23/2017 Discharge date: 01/25/2017  PATIENT LEFT THE HOSPITAL AGAINST MEDICAL ADVICE  Brief/Interim Summary: 31 year old female with history of hepatitis C was not been treated in the past, presents to the hospital with complaints of abdominal pain. She initially described the pain in her right upper quadrant but subsequently said it was her periumbilical area. She was noted to have a mild fever on admission. She described myalgias and possible rash on her abdomen prior to admission. Since she's come to the hospital, myalgias and rash has resolved. She continues to have significant abdominal pain. Initial imaging with CT scan showed possible thickening of the gallbladder without any stones. This is further evaluated by ultrasound as well as HIDA scan. She was seen by general surgery and it was not felt that she did not have acute cholecystitis leading to her symptoms. She was also seen by gastroenterology. On 10/5. Patient abruptly said that she wanted to leave the hospital AGAINST MEDICAL ADVICE. She did not give any clear reason as to why she wanted to leave.  Discharge Diagnoses:  Principal Problem:   Abdominal pain Active Problems:   Hyperglycemia   Fever   Acute cholecystitis   Anemia    Discharge Instructions   Allergies as of 01/25/2017   No Known Allergies     Medication List    ASK your doctor about these medications   ALEVE 220 MG tablet Generic drug:  naproxen sodium Take 440 mg by mouth daily as needed.   ibuprofen 200 MG tablet Commonly known as:  ADVIL,MOTRIN Take 400 mg by mouth every 6 (six) hours as needed for mild pain or moderate pain.       No Known Allergies  Consultations:  General surgery  GI   Procedures/Studies: Dg Chest 2 View  Result Date: 01/23/2017 CLINICAL DATA:  Fever and abdominal pain since yesterday.  History of bronchitis. Current smoker. EXAM: CHEST  2 VIEW COMPARISON:  None. FINDINGS: The heart size and mediastinal contours are within normal limits. Both lungs are clear. The visualized skeletal structures are unremarkable. IMPRESSION: No active cardiopulmonary disease. Electronically Signed   By: Burman Nieves M.D.   On: 01/23/2017 23:04   Ct Abdomen Pelvis W Contrast  Result Date: 01/23/2017 CLINICAL DATA:  Abdominal pain, worse today. Fever, chills, headache. EXAM: CT ABDOMEN AND PELVIS WITH CONTRAST TECHNIQUE: Multidetector CT imaging of the abdomen and pelvis was performed using the standard protocol following bolus administration of intravenous contrast. CONTRAST:  ISOVUE-300 IOPAMIDOL (ISOVUE-300) INJECTION 61% COMPARISON:  None. FINDINGS: Lower chest: Lung bases are clear. Hepatobiliary: Suggestion of wall thickening and edema of the gallbladder. No stones identified. Can't exclude cholecystitis. Ultrasound may provide additional information. No significant bile duct dilatation. No focal liver lesions. Pancreas: Unremarkable. No pancreatic ductal dilatation or surrounding inflammatory changes. Spleen: Normal in size without focal abnormality. Adrenals/Urinary Tract: Adrenal glands are unremarkable. Kidneys are normal, without renal calculi, focal lesion, or hydronephrosis. Bladder is unremarkable. Stomach/Bowel: Stomach is within normal limits. Appendix is not identified. No evidence of bowel wall thickening, distention, or inflammatory changes. Vascular/Lymphatic: No significant vascular findings are present. No enlarged abdominal or pelvic lymph nodes. Reproductive: An intrauterine device is present centrally within the uterus. Uterus and ovaries are not enlarged. Other: No free air or free fluid in the abdomen. Abdominal wall musculature appears intact. Musculoskeletal: No acute or significant osseous findings. IMPRESSION: 1. Suggestion of  mild wall thickening of the gallbladder. Can't  exclude cholecystitis. Consider ultrasound for further evaluation. 2. No evidence of bowel obstruction or inflammation. Appendix is not identified. 3. Intrauterine device is present. Electronically Signed   By: Burman Nieves M.D.   On: 01/23/2017 23:19   Nm Hepato W/eject Fract  Result Date: 01/24/2017 CLINICAL DATA:  Abdominal pain.  Abnormal ultrasound EXAM: NUCLEAR MEDICINE HEPATOBILIARY IMAGING WITH GALLBLADDER EF VIEWS: Anterior right upper quadrant RADIOPHARMACEUTICALS:  5.3 mCi Tc-18m  Choletec IV COMPARISON:  Ultrasound right upper quadrant January 24, 2017 FINDINGS: Liver uptake of radiotracer is normal. There is prompt visualization of gallbladder and small bowel, indicating patency of the cystic and common bile ducts. The patient consumed 8 ounces of Ensure Plus orally with calculation of the computer generated ejection fraction of radiotracer from the gallbladder. The patient did experience nausea and abdominal pain with the oral Ensure Plus consumption. The computer generated ejection fraction of radiotracer from the gallbladder is normal at 93%, normal greater than 33% using the oral agent. IMPRESSION: Normal ejection fraction of radiotracer from the gallbladder. The patient did experience nausea and abdominal pain with the oral Ensure Plus consumption. Cystic and common bile ducts are patent as is evidenced by visualization of gallbladder and small bowel. Electronically Signed   By: Bretta Bang III M.D.   On: 01/24/2017 16:36   US Abdomen Limited Ruq  Result Date: 01/24/2017 CLINICAL DATA:  Abdominal pain over the last 2 days. Gallbladder wall thickening questioned at CT. Hepatitis-C. EXAM: ULTRASOUND ABDOMEN LIMITED RIGHT UPPER QUADRANT COMPARISON:  CT 01/23/2017 FINDINGS: Gallbladder: No gallstones or sludge. Wall thickness 4.5 mm. Negative Murphy sign. Common bile duct: Diameter: 3.2 mm common normal Liver: No focal lesion identified. Within normal limits in parenchymal  echogenicity. Portal vein is patent on color Doppler imaging with normal direction of blood flow towards the liver. IMPRESSION: No evidence of stone disease. Gallbladder wall slightly thick, 4.5 mm. No Murphy sign. This finding can be seen with hepatitis. Acalculous cholecystitis not excluded but not strongly favored. Electronically Signed   By: Paulina Fusi M.D.   On: 01/24/2017 10:05       The results of significant diagnostics from this hospitalization (including imaging, microbiology, ancillary and laboratory) are listed below for reference.     Microbiology: No results found for this or any previous visit (from the past 240 hour(s)).   Labs: BNP (last 3 results) No results for input(s): BNP in the last 8760 hours. Basic Metabolic Panel:  Recent Labs Lab 01/23/17 1922 01/24/17 0146 01/25/17 0431  NA 135 135 138  K 3.9 3.5 3.6  CL 100* 103 104  CO2 GLUCOSE 112* 125* 84  BUN CREATININE 0.65 0.73 0.70  CALCIUM 8.7* 7.8* 8.1*   Liver Function Tests:  Recent Labs Lab 01/23/17 1922 01/24/17 0146 01/25/17 0431  AST 47* 39 79*  ALT 51 43 61*  ALKPHOS 65 52 48  BILITOT 0.5 0.8 0.5  PROT 7.5 6.4* 6.0*  ALBUMIN 4.1 3.3* 3.0*    Recent Labs Lab 01/23/17 1922  LIPASE 22   No results for input(s): AMMONIA in the last 168 hours. CBC:  Recent Labs Lab 01/23/17 1922 01/24/17 0146 01/25/17 0431  WBC 10.5 13.7* 5.3  NEUTROABS 8.8*  --   --   HGB 13.2 11.7* 11.6*  HCT 40.0 35.2* 34.6*  MCV 91.3 91.0 91.3  PLT 212 194 183   Cardiac Enzymes:  Recent Labs Lab 01/24/17  1610 01/24/17 0744 01/24/17 1322  TROPONINI <0.03 <0.03 <0.03   BNP: Invalid input(s): POCBNP CBG: No results for input(s): GLUCAP in the last 168 hours. D-Dimer  Recent Labs  01/24/17 0146  DDIMER 0.44   Hgb A1c  Recent Labs  01/24/17 0146  HGBA1C 5.4   Lipid Profile No results for input(s): CHOL, HDL, LDLCALC, TRIG, CHOLHDL, LDLDIRECT in the last 72  hours. Thyroid function studies  Recent Labs  01/24/17 0146  TSH 0.355   Anemia work up No results for input(s): VITAMINB12, FOLATE, FERRITIN, TIBC, IRON, RETICCTPCT in the last 72 hours. Urinalysis    Component Value Date/Time   COLORURINE YELLOW 01/23/2017 1730   APPEARANCEUR CLEAR 01/23/2017 1730   LABSPEC 1.010 01/23/2017 1730   PHURINE 5.0 01/23/2017 1730   GLUCOSEU NEGATIVE 01/23/2017 1730   HGBUR NEGATIVE 01/23/2017 1730   BILIRUBINUR NEGATIVE 01/23/2017 1730   KETONESUR NEGATIVE 01/23/2017 1730   PROTEINUR NEGATIVE 01/23/2017 1730   NITRITE NEGATIVE 01/23/2017 1730   LEUKOCYTESUR NEGATIVE 01/23/2017 1730   Sepsis Labs Invalid input(s): PROCALCITONIN,  WBC,  LACTICIDVEN Microbiology No results found for this or any previous visit (from the past 240 hour(s)).   SIGNEDErick Blinks, MD  [facility] 01/25/2017, 6:54 PM Pager   If 7PM-7AM, please contact night-coverage [email or web address] Password [email or web address]

## 2017-05-29 ENCOUNTER — Encounter (HOSPITAL_COMMUNITY): Payer: Self-pay | Admitting: Emergency Medicine

## 2017-05-29 ENCOUNTER — Emergency Department (HOSPITAL_COMMUNITY)
Admission: EM | Admit: 2017-05-29 | Discharge: 2017-05-29 | Disposition: A | Discharge status: Home / Self Care | Payer: Self-pay | Attending: Emergency Medicine | Admitting: Emergency Medicine

## 2017-05-29 ENCOUNTER — Other Ambulatory Visit: Payer: Self-pay

## 2017-05-29 ENCOUNTER — Emergency Department (HOSPITAL_COMMUNITY): Payer: Self-pay

## 2017-05-29 DIAGNOSIS — L02413 Cutaneous abscess of right upper limb: Secondary | ICD-10-CM | POA: Insufficient documentation

## 2017-05-29 DIAGNOSIS — F17210 Nicotine dependence, cigarettes, uncomplicated: Secondary | ICD-10-CM | POA: Insufficient documentation

## 2017-05-29 DIAGNOSIS — L0291 Cutaneous abscess, unspecified: Secondary | ICD-10-CM

## 2017-05-29 MED ORDER — IBUPROFEN 800 MG PO TABS
800.0000 mg | ORAL_TABLET | Freq: Once | ORAL | Status: AC
  Administered 2017-05-29: 800 mg via ORAL
  Filled 2017-05-29: qty 1

## 2017-05-29 MED ORDER — SULFAMETHOXAZOLE-TRIMETHOPRIM 800-160 MG PO TABS: 1.0000 | ORAL_TABLET | Freq: Two times a day (BID) | ORAL | 0 refills | Status: AC

## 2017-05-29 MED ORDER — HYDROGEN PEROXIDE 3 % EX SOLN
CUTANEOUS | Status: AC
  Filled 2017-05-29: qty 473

## 2017-05-29 MED ORDER — AMOXICILLIN-POT CLAVULANATE 875-125 MG PO TABS
1.0000 | ORAL_TABLET | Freq: Once | ORAL | Status: AC
  Administered 2017-05-29: 1 via ORAL
  Filled 2017-05-29: qty 1

## 2017-05-29 MED ORDER — LIDOCAINE-EPINEPHRINE (PF) 2 %-1:200000 IJ SOLN
10.0000 mL | Freq: Once | INTRAMUSCULAR | Status: AC
  Administered 2017-05-29: 10 mL
  Filled 2017-05-29: qty 20

## 2017-05-29 MED ORDER — LIDOCAINE HCL (PF) 1 % IJ SOLN
INTRAMUSCULAR | Status: AC
  Filled 2017-05-29: qty 2

## 2017-05-29 MED ORDER — PROMETHAZINE HCL 12.5 MG PO TABS
12.5000 mg | ORAL_TABLET | Freq: Once | ORAL | Status: AC
  Administered 2017-05-29: 12.5 mg via ORAL
  Filled 2017-05-29: qty 1

## 2017-05-29 MED ORDER — ACETAMINOPHEN 500 MG PO TABS
1000.0000 mg | ORAL_TABLET | Freq: Once | ORAL | Status: AC
  Administered 2017-05-29: 1000 mg via ORAL
  Filled 2017-05-29: qty 2

## 2017-05-29 MED ORDER — DOXYCYCLINE HYCLATE 100 MG PO TABS
100.0000 mg | ORAL_TABLET | Freq: Once | ORAL | Status: AC
Start: 2017-05-29 — End: 2017-05-29
  Administered 2017-05-29: 100 mg via ORAL
  Filled 2017-05-29: qty 1

## 2017-05-29 MED ORDER — CEFTRIAXONE SODIUM 1 G IJ SOLR
1.0000 g | Freq: Once | INTRAMUSCULAR | Status: AC
  Administered 2017-05-29: 1 g via INTRAMUSCULAR
  Filled 2017-05-29: qty 10

## 2017-05-29 MED ORDER — POVIDONE-IODINE 10 % EX SOLN
CUTANEOUS | Status: AC
  Filled 2017-05-29: qty 15

## 2017-05-29 NOTE — ED Provider Notes (Signed)
[facility] EMERGENCY DEPARTMENT Provider Note   CSN: [id number] Arrival date & time: 05/29/17  4098     History   Chief Complaint Chief Complaint  Patient presents with  . Abscess    HPI Anita Davis is a 32 y.o. female.  Patient is a 32 year old female who presents to the emergency department with an abscess to the right antecubital area.  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  She states she had been clean for approximately 3 years.  She had a relapse a week or so ago and developed an abscess.  She been trying to take care of the abscess with conservative measures.  She is been using Tylenol and ibuprofen for pain of the arm.            No complaint of shortness of breath, cough, extreme fatigue or chills.  No high fevers reported.  It is of note however that the patient has been taking ibuprofen on a regular basis to assist with her pain.      Past Medical History:  Diagnosis Date  . Hepatitis-C     Patient Active Problem List   Diagnosis Date Noted  . Acute cholecystitis   . Anemia   . Abdominal pain 01/24/2017  . Hyperglycemia 01/24/2017  . Fever 01/24/2017    Past Surgical History:  Procedure Laterality Date  . HEMORRHOID BANDING      OB History    Gravida Para Term Preterm AB Living             2   SAB TAB Ectopic Multiple Live Births                   Home Medications    Prior to Admission medications   Medication Sig Start Date End Date Taking? Authorizing Provider  ibuprofen (ADVIL,MOTRIN) 200 MG tablet Take 400 mg by mouth every 6 (six) hours as needed for mild pain or moderate pain.    [provider]  naproxen sodium (ALEVE) 220 MG tablet Take 440 mg by mouth daily as needed.    [provider]    Family History Family History  Problem Relation Age of Onset  . Depression Mother   . Cervical cancer Mother   . Hypertension Father   . Diabetes Father     Social History Social History    Tobacco Use  . Smoking status: Current Every Day Smoker    Packs/day: 0.05    Types: Cigarettes  . Smokeless tobacco: Never Used  Substance Use Topics  . Alcohol use: No  . Drug use: No     Allergies   Patient has no known allergies.   Review of Systems Review of Systems  Constitutional: Negative for activity change and fever.       All ROS Neg except as noted in HPI  HENT: Negative for nosebleeds.   Eyes: Negative for photophobia and discharge.  Respiratory: Negative for cough, shortness of breath and wheezing.   Cardiovascular: Negative for chest pain and palpitations.  Gastrointestinal: Positive for nausea. Negative for abdominal pain, blood in stool and vomiting.  Genitourinary: Negative for dysuria, frequency and hematuria.  Musculoskeletal: Negative for arthralgias, back pain and neck pain.  Skin: Negative.        Abscess right arm.  Neurological: Negative for dizziness, seizures and speech difficulty.  Psychiatric/Behavioral: Negative for confusion and hallucinations.     Physical Exam Updated Vital Signs BP 119/85 (BP Location: Left Arm)  Pulse 88   Temp 98.6 F (37 C) (Oral)   Resp 18   Ht 5\' 3"  (1.6 m)   Wt 63.5 kg (140 lb)   LMP 05/15/2017   SpO2 98%   BMI 24.80 kg/m   Physical Exam  Constitutional: She is oriented to person, place, and time. She appears well-developed and well-nourished.  Non-toxic appearance.  HENT:  Head: Normocephalic.  Right Ear: Tympanic membrane and external ear normal.  Left Ear: Tympanic membrane and external ear normal.  Eyes: EOM and lids are normal. Pupils are equal, round, and reactive to light.  Neck: Normal range of motion. Neck supple. Carotid bruit is not present.  Cardiovascular: Normal rate, regular rhythm, normal heart sounds, intact distal pulses and normal pulses. Exam reveals no gallop and no friction rub.  No murmur heard. Pulmonary/Chest: No respiratory distress. She has rhonchi.  Abdominal: Soft.  Bowel sounds are normal. There is no tenderness. There is no guarding.  Musculoskeletal: Normal range of motion. She exhibits tenderness.  There is a firm,  tender abscess of the right antecubital area.  There are no red streaks going up the arm.  The radial and brachial pulses are 2+.  Capillary refill is less than 2 seconds.  There is mild tenderness in the right axilla, but no palpable nodes appreciated.  Lymphadenopathy:       Head (right side): No submandibular adenopathy present.       Head (left side): No submandibular adenopathy present.    She has no cervical adenopathy.  Neurological: She is alert and oriented to person, place, and time. She has normal strength. No cranial nerve deficit or sensory deficit.  Skin: Skin is warm and dry.  Psychiatric: She has a normal mood and affect. Her speech is normal.  Nursing note and vitals reviewed.    ED Treatments / Results  Labs (all labs ordered are listed, but only abnormal results are displayed) Labs Reviewed - No data to display  EKG  EKG Interpretation None       Radiology No results found.  Procedures .Marland Kitchen.Incision and Drainage Date/Time: 05/29/2017 2:32 PM Performed by: Ivery QualeBryant, Wali Reinheimer, PA-C Authorized by: Ivery QualeBryant, Linden Mikes, PA-C   Consent:    Consent obtained:  Verbal   Consent given by:  Patient   Risks discussed:  Bleeding, incomplete drainage, infection and pain Location:    Type:  Abscess   Location:  Upper extremity   Upper extremity location:  Arm   Arm location:  R upper arm Pre-procedure details:    Skin preparation:  Betadine Procedure type:    Complexity:  Simple Procedure details:    Incision types:  Single straight   Incision depth:  Subcutaneous   Scalpel blade:  11   Wound management:  Probed and deloculated and irrigated with saline   Drainage:  Bloody and purulent   Drainage amount:  Copious   Wound treatment:  Wound left open   Packing materials:  None Post-procedure details:    Patient  tolerance of procedure:  Tolerated well, no immediate complications Comments:     Culture sent to the lab.    (including critical care time)  Medications Ordered in ED Medications - No data to display   Initial Impression / Assessment and Plan / ED Course  I have reviewed the triage vital signs and the nursing notes.  Pertinent labs & imaging results that were available during my care of the patient were reviewed by me and considered in my medical decision making (  see chart for details).       Final Clinical Impressions(s) / ED Diagnoses  MDM  Patient states that she uses IV drugs, including IV cocaine.  She was using about 3 or 4 days ago, the needle missed the vein, and the patient developed an abscess.  Incision and drainage carried out after the patient had an ultrasound that revealed a 4.5 cm area of inflammation.  Copious amount of purulent material was removed.  The patient will be treated with Bactrim. asked the patient to soak the affected area and warm Epson salt water for 10-15 minutes daily.  Patient is in agreement with this plan.  2:53PM - Pt states she vomited the antibiotics. IM Rocephin given, and promethazine given. Pt will return to the ED if any changes or problem.   Final diagnoses:  Abscess    ED Discharge Orders    None       Ivery Quale, Cordelia Poche 05/29/17 2012    Raeford Razor, MD 05/30/17 [phone]

## 2017-05-29 NOTE — ED Notes (Signed)
Patient returned from US.  Warm compress applied to right antecubital.

## 2017-05-29 NOTE — ED Notes (Signed)
Patient transported to Ultrasound 

## 2017-05-29 NOTE — Discharge Instructions (Signed)
Please soak your arm in a basin or tub of warm Epson salt water.  Allow it to stay in place for about 10-15 minutes daily.  Please dry the area thoroughly, and do this until the wound has healed from the inside out.  Please change the dressing daily.  Use Bactrim 2 times daily with food until all taken.  Please see your primary physician or return to the emergency department if any signs of advancing infection.

## 2017-05-29 NOTE — ED Triage Notes (Signed)
Pt reports right medial abscess for last several days. Pt denies any known fever.pt reports has been clean for last three years but reports relapsed and missed in right arm. Pt reports pain ever since.

## 2017-06-01 LAB — AEROBIC CULTURE  (SUPERFICIAL SPECIMEN)

## 2017-06-01 LAB — AEROBIC CULTURE W GRAM STAIN (SUPERFICIAL SPECIMEN)

## 2017-06-02 ENCOUNTER — Telehealth: Payer: Self-pay

## 2017-06-02 NOTE — Telephone Encounter (Signed)
Post ED Visit - Positive Culture Follow-up  Culture report reviewed by antimicrobial stewardship pharmacist:  []  Enzo BiNathan Batchelder, Pharm.D. []  Celedonio MiyamotoJeremy Frens, Pharm.D., BCPS AQ-ID []  Garvin FilaMike Maccia, Pharm.D., BCPS []  Georgina PillionElizabeth Martin, [address] BCPS []  RocklinMinh Pham, [address]Ph[address]D., BCPS, AAHIVP []  Estella HuskMichelle Turner, Pharm.D., BCPS, AAHIVP []  Lysle Pearlachel Rumbarger, PharmD, BCPS []  Blake DivineShannon Parkey, PharmD []  Pollyann SamplesAndy Johnston, PharmD, BCPS Samella Parrony R. Pharm D Positive aerobic culture Treated with , oSulfamethoxazole-Trimethoprim organism sensitive to the same and no further patient follow-up is required at this time.  Anita CarasCullom, Anita Davis 06/02/2017, 12:53 PM

## 2017-07-31 ENCOUNTER — Other Ambulatory Visit: Payer: Self-pay

## 2017-07-31 ENCOUNTER — Encounter (HOSPITAL_COMMUNITY): Payer: Self-pay

## 2017-07-31 ENCOUNTER — Emergency Department (HOSPITAL_COMMUNITY)
Admission: EM | Admit: 2017-07-31 | Discharge: 2017-07-31 | Disposition: A | Discharge status: Home / Self Care | Payer: Self-pay | Attending: Emergency Medicine | Admitting: Emergency Medicine

## 2017-07-31 DIAGNOSIS — F17210 Nicotine dependence, cigarettes, uncomplicated: Secondary | ICD-10-CM | POA: Insufficient documentation

## 2017-07-31 DIAGNOSIS — L02411 Cutaneous abscess of right axilla: Secondary | ICD-10-CM | POA: Insufficient documentation

## 2017-07-31 MED ORDER — SULFAMETHOXAZOLE-TRIMETHOPRIM 800-160 MG PO TABS: 1.0000 | ORAL_TABLET | Freq: Two times a day (BID) | ORAL | 0 refills | Status: AC

## 2017-07-31 MED ORDER — IBUPROFEN 800 MG PO TABS
800.0000 mg | ORAL_TABLET | Freq: Once | ORAL | Status: AC
  Administered 2017-07-31: 800 mg via ORAL
  Filled 2017-07-31: qty 1

## 2017-07-31 MED ORDER — TRAMADOL HCL 50 MG PO TABS: ORAL_TABLET | ORAL | 0 refills | Status: DC

## 2017-07-31 MED ORDER — TRAMADOL HCL 50 MG PO TABS
100.0000 mg | ORAL_TABLET | Freq: Once | ORAL | Status: AC
  Administered 2017-07-31: 100 mg via ORAL
  Filled 2017-07-31: qty 2

## 2017-07-31 MED ORDER — CEFTRIAXONE SODIUM 1 G IJ SOLR
1.0000 g | Freq: Once | INTRAMUSCULAR | Status: AC
  Administered 2017-07-31: 1 g via INTRAMUSCULAR
  Filled 2017-07-31: qty 10

## 2017-07-31 MED ORDER — LIDOCAINE HCL (PF) 1 % IJ SOLN
INTRAMUSCULAR | Status: AC
  Administered 2017-07-31: 2 mL
  Filled 2017-07-31: qty 2

## 2017-07-31 MED ORDER — DOXYCYCLINE HYCLATE 100 MG PO TABS
100.0000 mg | ORAL_TABLET | Freq: Once | ORAL | Status: AC
  Administered 2017-07-31: 100 mg via ORAL
  Filled 2017-07-31: qty 1

## 2017-07-31 NOTE — Discharge Instructions (Addendum)
You were treated in the emergency department with intramuscular Rocephin.  Please use Bactrim 2 times daily with food.  Please soak the abscess area and warm Epson salt water 2 times daily for about 15 minutes.  Please have the wound rechecked in 3days.  Use 600 mg of ibuprofen with breakfast, lunch, dinner, and at bedtime to improve swelling and inflammation.  Return to the emergency department sooner if any red streaks going up the arm, or fever that will not respond to Tylenol or ibuprofen, or changes in your general condition.

## 2017-07-31 NOTE — ED Notes (Signed)
ED Provider at bedside. 

## 2017-07-31 NOTE — ED Provider Notes (Signed)
[facility] EMERGENCY DEPARTMENT Provider Note   CSN: [id number] Arrival date & time: 07/31/17  4098     History   Chief Complaint Chief Complaint  Patient presents with  . Abscess    HPI Anita Davis is a 32 y.o. female.  Patient is a 32 year old female who presents to the emergency department with a complaint of an abscess.  The patient states that she is an IV drug user, and that she last used IV drugs 5 days ago.  She now has swelling and redness and pain of her right antecubital area.  It is of note that she had a similar abscess approximately 2 months ago that required incision and drainage.  The patient states that she has pain in the area, and she did not rest well last night because of the pain.  No reported fever, no reported chills.  The patient is not seen any red streaking going up the arm.  Patient has a history of hepatitis C.  No history of HIV or other complicating illnesses.     Past Medical History:  Diagnosis Date  . Hepatitis-C     Patient Active Problem List   Diagnosis Date Noted  . Acute cholecystitis   . Anemia   . Abdominal pain 01/24/2017  . Hyperglycemia 01/24/2017  . Fever 01/24/2017    Past Surgical History:  Procedure Laterality Date  . HEMORRHOID BANDING       OB History    Gravida      Para      Term      Preterm      AB      Living  2     SAB      TAB      Ectopic      Multiple      Live Births               Home Medications    Prior to Admission medications   Medication Sig Start Date End Date Taking? Authorizing Provider  ibuprofen (ADVIL,MOTRIN) 200 MG tablet Take 400 mg by mouth every 6 (six) hours as needed for mild pain or moderate pain.    [provider]  naproxen sodium (ALEVE) 220 MG tablet Take 440 mg by mouth daily as needed.    [provider]    Family History Family History  Problem Relation Age of Onset  . Depression Mother   . Cervical cancer Mother   .  Hypertension Father   . Diabetes Father     Social History Social History   Tobacco Use  . Smoking status: Current Every Day Smoker    Packs/day: 0.05    Types: Cigarettes  . Smokeless tobacco: Never Used  Substance Use Topics  . Alcohol use: No  . Drug use: Yes    Types: IV    Comment: pt took suboxone prior to arrival.       Allergies   Patient has no known allergies.   Review of Systems Review of Systems  Skin:       Abscess     Physical Exam Updated Vital Signs BP 122/73 (BP Location: Left Arm)   Pulse 66 Comment: Simultaneous filing. User may not have seen previous data.  Temp 98.1 F (36.7 C) (Oral)   Resp 18   Ht 5\' 3"  (1.6 m)   Wt 63.5 kg (140 lb)   LMP 07/24/2017   SpO2 100% Comment: Simultaneous filing. User may not have seen previous  data.  BMI 24.80 kg/m   Physical Exam  Constitutional: She is oriented to person, place, and time. She appears well-developed and well-nourished.  Non-toxic appearance.  HENT:  Head: Normocephalic.  Right Ear: Tympanic membrane and external ear normal.  Left Ear: Tympanic membrane and external ear normal.  Eyes: Pupils are equal, round, and reactive to light. EOM and lids are normal.  Neck: Normal range of motion. Neck supple. Carotid bruit is not present.  Cardiovascular: Normal rate, regular rhythm, normal heart sounds, intact distal pulses and normal pulses.  No murmur heard. Pulmonary/Chest: Breath sounds normal. No respiratory distress.  Abdominal: Soft. Bowel sounds are normal. There is no tenderness. There is no guarding.  Musculoskeletal: Normal range of motion.  There is a tender abscess of the right antecubital area.  There is scar tissue palpated from previous incision and drainage.  The area is not hot.  There are no red streaks appreciated.  The brachial pulse is 2+.  The radial pulse is 2+.  There is full range of motion of the right shoulder, elbow, wrist, and fingers.  Capillary refill is less than 2  seconds. No palpable nodes of the axilla.  Lymphadenopathy:       Head (right side): No submandibular adenopathy present.       Head (left side): No submandibular adenopathy present.    She has no cervical adenopathy.  Neurological: She is alert and oriented to person, place, and time. She has normal strength. No cranial nerve deficit or sensory deficit.  Skin: Skin is warm and dry.  Psychiatric: She has a normal mood and affect. Her speech is normal.  Nursing note and vitals reviewed.    ED Treatments / Results  Labs (all labs ordered are listed, but only abnormal results are displayed) Labs Reviewed - No data to display  EKG None  Radiology No results found.  Procedures Procedures (including critical care time)  Medications Ordered in ED Medications - No data to display   Initial Impression / Assessment and Plan / ED Course  I have reviewed the triage vital signs and the nursing notes.  Pertinent labs & imaging results that were available during my care of the patient were reviewed by me and considered in my medical decision making (see chart for details).       Final Clinical Impressions(s) / ED Diagnoses MDM  Vital signs within normal limits.  Pulse oximetry is 100% on room air.  The patient has an abscess that is developing involving the right antecubital.  The area is not hot, there are no red streaks appreciated.  This abscess is not a candidate for incision and drainage at this time.  I have instructed the patient of the findings on her examination.  I have instructed the patient that she will be treated with antibiotics and asked to use warm soaks.  She will also be treated with an anti-inflammatory medication for inflammation and pain.  She will be treated with short course of Ultram for pain.  The patient states that if the pain continues that she will open up the abscess herself.  I instructed the patient of the danger of doing this and the high risk of  infection using nonsterile equipment.  The patient does not acknowledge my instruction at this time.   Final diagnoses:  Abscess of axilla, right    ED Discharge Orders        Ordered    traMADol (ULTRAM) 50 MG tablet  07/31/17 1130    sulfamethoxazole-trimethoprim (BACTRIM DS,SEPTRA DS) 800-160 MG tablet  2 times daily     07/31/17 1130       Ivery Quale, PA-C 07/31/17 1154    Samuel Jester, [place] 08/03/17 [phone]

## 2017-07-31 NOTE — ED Triage Notes (Signed)
Pt reports she is an IV drug user and after using IV drugs in r ac Friday she started having redness, swelling, and pain.

## 2017-08-16 ENCOUNTER — Other Ambulatory Visit: Payer: Self-pay | Admitting: Women's Health

## 2018-07-12 ENCOUNTER — Other Ambulatory Visit: Payer: Self-pay

## 2018-07-12 ENCOUNTER — Emergency Department (HOSPITAL_COMMUNITY)
Admission: EM | Admit: 2018-07-12 | Discharge: 2018-07-12 | Disposition: A | Discharge status: Home / Self Care | Payer: Self-pay | Attending: Emergency Medicine | Admitting: Emergency Medicine

## 2018-07-12 ENCOUNTER — Encounter (HOSPITAL_COMMUNITY): Payer: Self-pay | Admitting: Emergency Medicine

## 2018-07-12 DIAGNOSIS — F17210 Nicotine dependence, cigarettes, uncomplicated: Secondary | ICD-10-CM | POA: Insufficient documentation

## 2018-07-12 DIAGNOSIS — J020 Streptococcal pharyngitis: Secondary | ICD-10-CM | POA: Insufficient documentation

## 2018-07-12 LAB — GROUP A STREP BY PCR: GROUP A STREP BY PCR: DETECTED — AB

## 2018-07-12 MED ORDER — LIDOCAINE VISCOUS HCL 2 % MT SOLN: 15.0000 mL | Freq: Four times a day (QID) | OROMUCOSAL | 0 refills | Status: DC | PRN

## 2018-07-12 MED ORDER — IBUPROFEN 600 MG PO TABS: 600.0000 mg | ORAL_TABLET | Freq: Four times a day (QID) | ORAL | 0 refills | Status: DC | PRN

## 2018-07-12 MED ORDER — LIDOCAINE VISCOUS HCL 2 % MT SOLN
15.0000 mL | Freq: Once | OROMUCOSAL | Status: AC
  Administered 2018-07-12: 15 mL via OROMUCOSAL
  Filled 2018-07-12: qty 15

## 2018-07-12 MED ORDER — PENICILLIN G BENZATHINE 1200000 UNIT/2ML IM SUSP
1.2000 10*6.[IU] | Freq: Once | INTRAMUSCULAR | Status: AC
  Administered 2018-07-12: 1.2 10*6.[IU] via INTRAMUSCULAR
  Filled 2018-07-12: qty 2

## 2018-07-12 NOTE — ED Provider Notes (Signed)
[facility] EMERGENCY DEPARTMENT Provider Note   CSN: [id number] Arrival date & time: 07/12/18  1329    History   Chief Complaint Chief Complaint  Patient presents with  . Cough    HPI Anita Davis is a 33 y.o. female.     The history is provided by the patient. No language interpreter was used.  Cough    33 year old female with hx of HepC presenting with flu sxs.  Since yesterday, patient has had fever, chills, body aches, sore throat, cough productive with sputum, headache, and feeling weak and tired.  Symptom is moderate in severity.  She tries taking Tylenol with some mild improvement.  She felt this is likely strep infection as she had had in the past.  She denies vomiting or diarrhea or rash.  No urinary symptoms.  Does not complain of any shortness of breath.  No recent travel and no recent sick contact.  Specifically, no contact with anyone that test positive for COVID-19.  Past Medical History:  Diagnosis Date  . Hepatitis-C     Patient Active Problem List   Diagnosis Date Noted  . Acute cholecystitis   . Anemia   . Abdominal pain 01/24/2017  . Hyperglycemia 01/24/2017  . Fever 01/24/2017    Past Surgical History:  Procedure Laterality Date  . HEMORRHOID BANDING       OB History    Gravida      Para      Term      Preterm      AB      Living  2     SAB      TAB      Ectopic      Multiple      Live Births               Home Medications    Prior to Admission medications   Medication Sig Start Date End Date Taking? Authorizing Provider  traMADol Veatrice Bourbon) 50 MG tablet 1 or 2 po q6h prn pain 07/31/17   Lily Kocher, PA-C    Family History Family History  Problem Relation Age of Onset  . Depression Mother   . Cervical cancer Mother   . Hypertension Father   . Diabetes Father     Social History Social History   Tobacco Use  . Smoking status: Current Every Day Smoker    Packs/day: 0.05    Types: Cigarettes  .  Smokeless tobacco: Never Used  Substance Use Topics  . Alcohol use: No  . Drug use: Not Currently    Types: IV    Comment: takes methadone     Allergies   Patient has no known allergies.   Review of Systems Review of Systems  Respiratory: Positive for cough.   All other systems reviewed and are negative.    Physical Exam Updated Vital Signs BP (!) 133/38 (BP Location: Right Arm)   Pulse 86   Temp 98.2 F (36.8 C) (Oral)   Resp 16   Ht _0  (1.6 m)   Wt 74.8 kg   LMP 06/28/2018   SpO2 96%   BMI 29.23 kg/m   Physical Exam Vitals signs and nursing note reviewed.  Constitutional:      General: She is not in acute distress.    Appearance: She is well-developed.  HENT:     Head: Atraumatic.     Right Ear: Tympanic membrane normal.     Left Ear: Tympanic membrane normal.  Nose: Nose normal.     Mouth/Throat:     Comments: Throat: Posterior oropharyngeal erythema without tonsillar enlargement no exudates, no trismus Eyes:     Conjunctiva/sclera: Conjunctivae normal.  Neck:     Musculoskeletal: Neck supple. No neck rigidity.  Cardiovascular:     Rate and Rhythm: Normal rate and regular rhythm.     Pulses: Normal pulses.     Heart sounds: Normal heart sounds.  Pulmonary:     Effort: Pulmonary effort is normal.     Breath sounds: Normal breath sounds.  Chest:     Chest wall: No tenderness.  Abdominal:     Palpations: Abdomen is soft.     Tenderness: There is no abdominal tenderness.  Skin:    Findings: No rash.  Neurological:     Mental Status: She is alert and oriented to person, place, and time.  Psychiatric:        Mood and Affect: Mood normal.      ED Treatments / Results  Labs (all labs ordered are listed, but only abnormal results are displayed) Labs Reviewed  GROUP A STREP BY PCR - Abnormal; Notable for the following components:      Result Value   Group A Strep by PCR DETECTED (*)    All other components within normal limits    EKG  None  Radiology No results found.  Procedures Procedures (including critical care time)  Medications Ordered in ED Medications  penicillin g benzathine (BICILLIN LA) 1200000 UNIT/2ML injection 1.2 Million Units (has no administration in time range)  lidocaine (XYLOCAINE) 2 % viscous mouth solution 15 mL (15 mLs Mouth/Throat Given 07/12/18 1424)     Initial Impression / Assessment and Plan / ED Course  I have reviewed the triage vital signs and the nursing notes.  Pertinent labs & imaging results that were available during my care of the patient were reviewed by me and considered in my medical decision making (see chart for details).        BP (!) 133/38 (BP Location: Right Arm)   Pulse 86   Temp 98.2 F (36.8 C) (Oral)   Resp 16   Ht _0  (1.6 m)   Wt 74.8 kg   LMP 06/28/2018   SpO2 96%   BMI 29.23 kg/m    Final Clinical Impressions(s) / ED Diagnoses   Final diagnoses:  Strep pharyngitis    ED Discharge Orders         Ordered    ibuprofen (ADVIL,MOTRIN) 600 MG tablet  Every 6 hours PRN     07/12/18 1540    lidocaine (XYLOCAINE) 2 % solution  Every 6 hours PRN     07/12/18 1540         2:20 PM Patient here with flulike symptoms, she is within the timeframe to be treated with Tamiflu.  She does have history of recurrent strep infection therefore will obtain strep test.  Viscous lidocaine given for Symptomatic treatment.  3:23 PM  Patient presenting with sore throat consistent with bacterial pharyngitis.  2 out of 4 Cent[facility] criteria were met.  Rapid strep was obtained and was positive.  The patient did not have trismus, hot potato voice, uvula deviation, unilateral tonsillar swelling, toxic appearance, drooling or pain with movement of the trachea to suggest peritonsillar abscess or epiglottitis.  No evidence of other bacterial infections including peritonsillar abscess, retropharyngeal abscess, epiglottitis.  Prescription for sore throat provided. Patient  advised to continue ibuprofen and Tylenol at home. Patient  is to followup with primary physician if has continued symptoms.  Impression: Bacterial Pharyngitis Sore Throat    Advised Pt on supportive therapies, including using a cool-mist vaporizer/humidifer/steam from hot showers, limit talking, OTC throat lozenges and mouthwashes qd, gargling w/ warm saltwater, advancement of fluids as tolerated, nasal saline sprays, rest, OTC acetaminophen or ibuprofen as directed prn for pain control, frequent handwashing, and boiling/disposing of contaminated toothbrushes.   * Instructed Pt to f/up w/ PCP should Sx worsen or not improve.    Domenic Moras, PA-C 07/12/18 [place], [place], DO 07/13/18 1329

## 2018-07-12 NOTE — ED Triage Notes (Signed)
No flu shot   Flu like sx since yesterday

## 2018-07-12 NOTE — ED Triage Notes (Signed)
Patient c/o productive cough, sore throat, headache, and fever that started yesterday. Patient states taking tylenol with last dose 3 hours ago. Per patient thick green sputum.

## 2018-07-12 NOTE — ED Notes (Signed)
Patient given discharge instruction, verbalized understand. Patient ambulatory out of the department.  

## 2019-02-08 IMAGING — DX DG FOOT COMPLETE 3+V*R*
3 series · 3 of 3 positions shown · non-contrast
Comparison: None.

CLINICAL DATA: Right foot pain and abrasion post crushing injury.

EXAM:
RIGHT FOOT COMPLETE - 3+ VIEW

[foot ap]
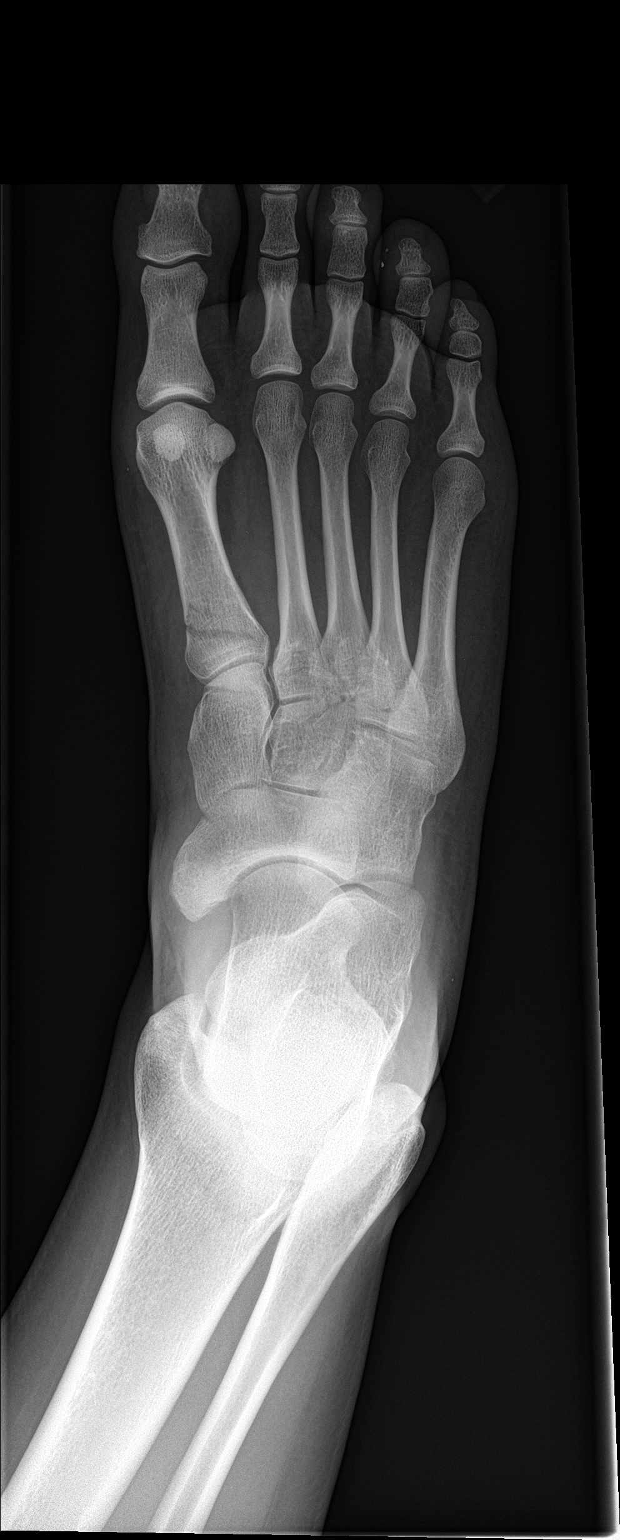

[foot obl]
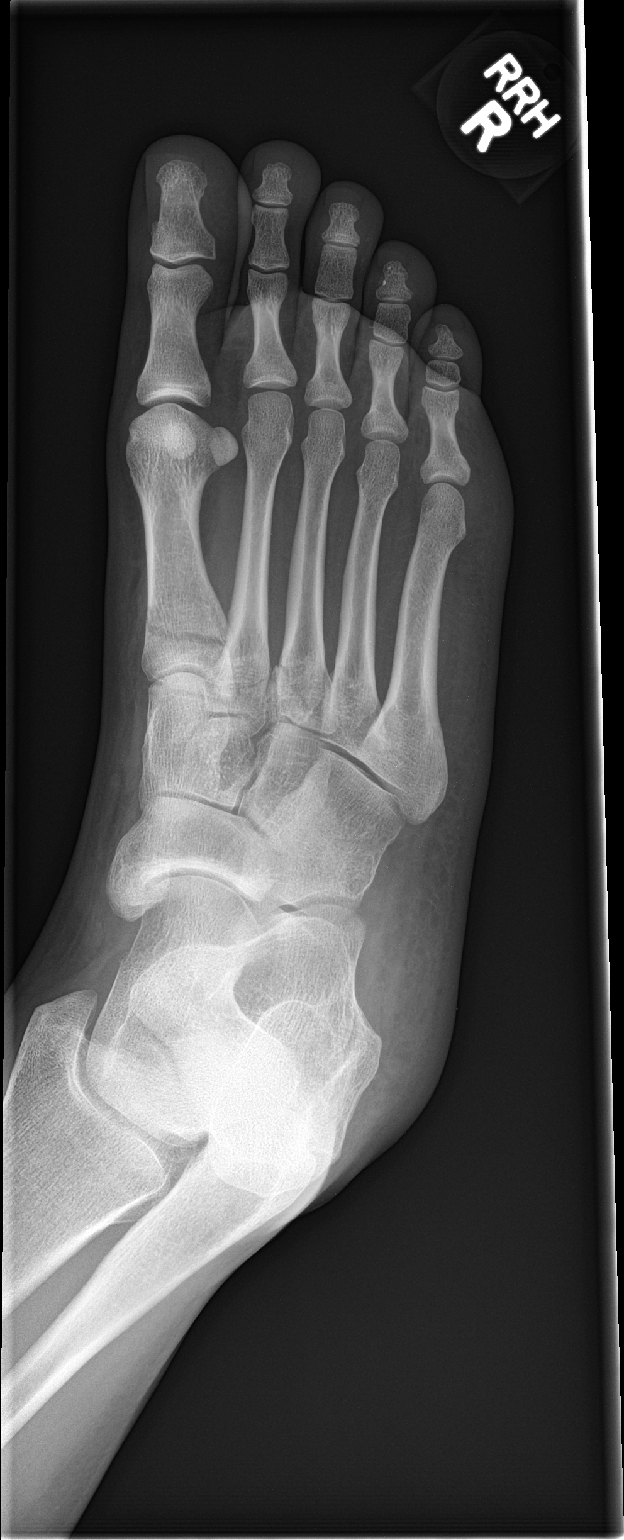

[foot lat]
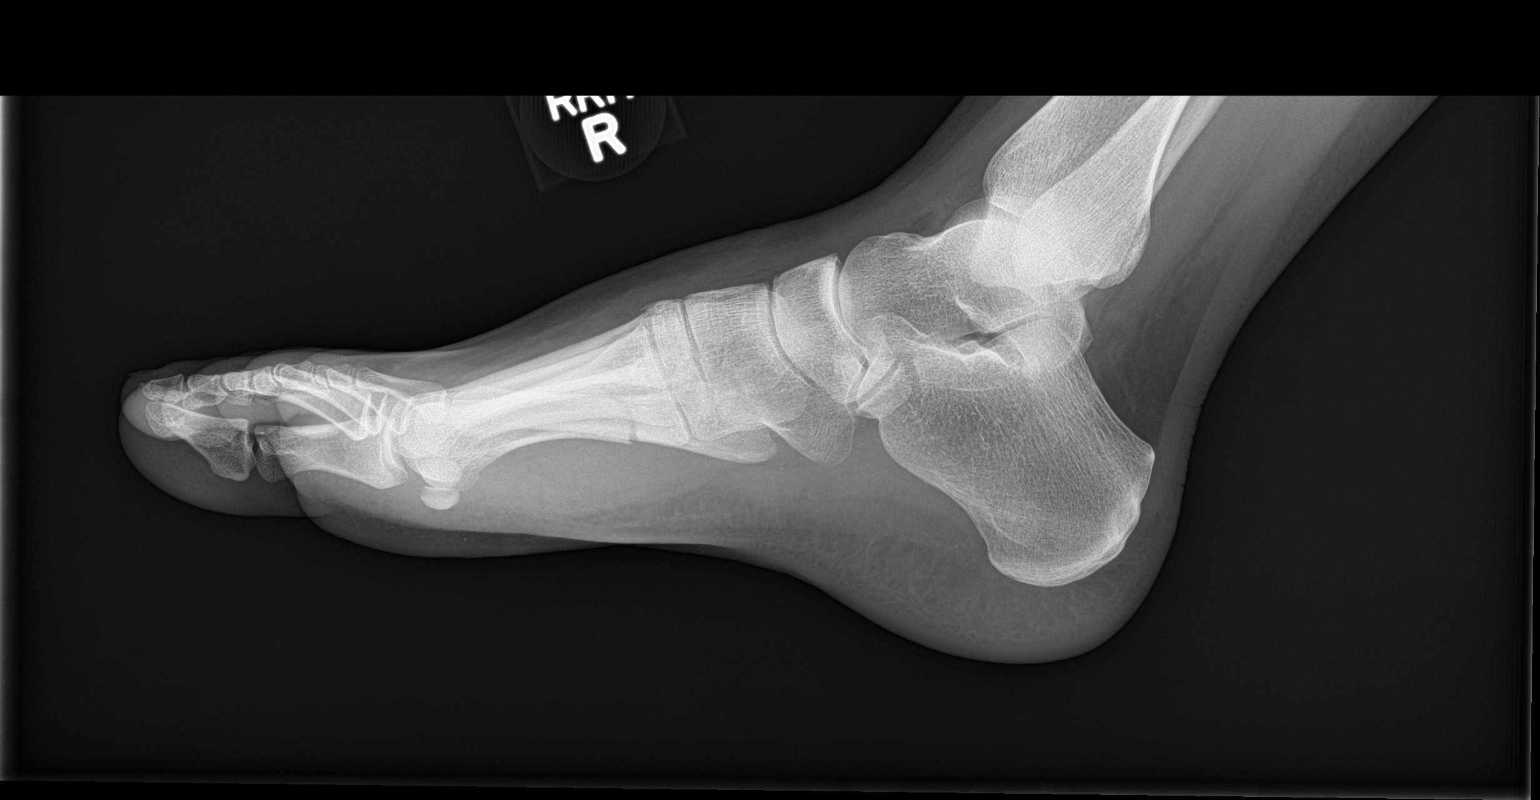

[3 of 3 positions shown; findings below may reference images not displayed]

FINDINGS: There are minimally displaced fractures of the base of the first,
second and third metatarsal bones, with intra-articular extension.
Linear lucencies through the medial and intermediate cuneiform
likely represent nondisplaced fractures. There is a potential for
Lisfranc joint disruption. There is an associated soft tissue
swelling.
IMPRESSION: Minimally displaced comminuted fractures of the base of the first,
second and third metatarsal bones.

Potential nondisplaced fractures of the medial and intermediate
cuneiform bones.

Potential disruption of the Lisfranc joint.

Orthopedic consultation may be considered.

## 2019-03-27 ENCOUNTER — Encounter (HOSPITAL_COMMUNITY): Payer: Self-pay

## 2019-03-27 ENCOUNTER — Emergency Department (HOSPITAL_COMMUNITY)
Admission: EM | Admit: 2019-03-27 | Discharge: 2019-03-27 | Disposition: A | Discharge status: Home / Self Care | Payer: Self-pay | Attending: Emergency Medicine | Admitting: Emergency Medicine

## 2019-03-27 ENCOUNTER — Other Ambulatory Visit: Payer: Self-pay

## 2019-03-27 DIAGNOSIS — F17210 Nicotine dependence, cigarettes, uncomplicated: Secondary | ICD-10-CM | POA: Insufficient documentation

## 2019-03-27 DIAGNOSIS — N390 Urinary tract infection, site not specified: Secondary | ICD-10-CM | POA: Insufficient documentation

## 2019-03-27 LAB — URINALYSIS, ROUTINE W REFLEX MICROSCOPIC
Bilirubin Urine: NEGATIVE
Glucose, UA: NEGATIVE mg/dL
Hgb urine dipstick: NEGATIVE
Ketones, ur: NEGATIVE mg/dL
Leukocytes,Ua: NEGATIVE
Nitrite: POSITIVE — AB
Protein, ur: NEGATIVE mg/dL
Specific Gravity, Urine: 1.02 (ref 1.005–1.030)
pH: 6 (ref 5.0–8.0)

## 2019-03-27 LAB — PREGNANCY, URINE: Preg Test, Ur: NEGATIVE

## 2019-03-27 LAB — CBG MONITORING, ED: Glucose-Capillary: 81 mg/dL (ref 70–99)

## 2019-03-27 MED ORDER — IBUPROFEN 400 MG PO TABS
400.0000 mg | ORAL_TABLET | Freq: Once | ORAL | Status: AC
  Administered 2019-03-27: 22:00:00 400 mg via ORAL
  Filled 2019-03-27: qty 1

## 2019-03-27 MED ORDER — CEPHALEXIN 500 MG PO CAPS: 500.0000 mg | ORAL_CAPSULE | Freq: Four times a day (QID) | ORAL | 0 refills | Status: AC

## 2019-03-27 MED ORDER — IBUPROFEN 800 MG PO TABS: 800.0000 mg | ORAL_TABLET | Freq: Three times a day (TID) | ORAL | 0 refills | Status: DC

## 2019-03-27 MED ORDER — CEPHALEXIN 500 MG PO CAPS
500.0000 mg | ORAL_CAPSULE | Freq: Once | ORAL | Status: AC
  Administered 2019-03-27: 500 mg via ORAL
  Filled 2019-03-27: qty 1

## 2019-03-27 NOTE — ED Provider Notes (Signed)
[facility] EMERGENCY DEPARTMENT Provider Note   CSN: [id number] Arrival date & time: 03/27/19  1837     History   Chief Complaint Chief Complaint  Patient presents with  . Dysuria    HPI Anita Davis is a 33 y.o. female.     The history is provided by the patient. No language interpreter was used.  Dysuria Pain quality:  Aching Pain severity:  Moderate Onset quality:  Gradual Timing:  Constant Chronicity:  New Recent urinary tract infections: yes   Relieved by:  Nothing Worsened by:  Nothing Ineffective treatments:  None tried Urinary symptoms: frequent urination   Associated symptoms: abdominal pain     Past Medical History:  Diagnosis Date  . Hepatitis-C     Patient Active Problem List   Diagnosis Date Noted  . Acute cholecystitis   . Anemia   . Abdominal pain 01/24/2017  . Hyperglycemia 01/24/2017  . Fever 01/24/2017    Past Surgical History:  Procedure Laterality Date  . HEMORRHOID BANDING       OB History    Gravida      Para      Term      Preterm      AB      Living  2     SAB      TAB      Ectopic      Multiple      Live Births               Home Medications    Prior to Admission medications   Medication Sig Start Date End Date Taking? Authorizing Provider  ibuprofen (ADVIL,MOTRIN) 600 MG tablet Take 1 tablet (600 mg total) by mouth every 6 (six) hours as needed. 07/12/18   Fayrene Helper, PA-C  lidocaine (XYLOCAINE) 2 % solution Use as directed 15 mLs in the mouth or throat every 6 (six) hours as needed for mouth pain (sore throat). 07/12/18   Fayrene Helper, PA-C  traMADol Janean Sark) 50 MG tablet 1 or 2 po q6h prn pain 07/31/17   Ivery Quale, PA-C    Family History Family History  Problem Relation Age of Onset  . Depression Mother   . Cervical cancer Mother   . Hypertension Father   . Diabetes Father     Social History Social History   Tobacco Use  . Smoking status: Current Every Day Smoker    Packs/day:  0.05    Types: Cigarettes  . Smokeless tobacco: Never Used  Substance Use Topics  . Alcohol use: No  . Drug use: Not Currently    Types: IV    Comment: takes methadone- now taking subutex (03/27/19)     Allergies   Patient has no known allergies.   Review of Systems Review of Systems  Gastrointestinal: Positive for abdominal pain.  Genitourinary: Positive for dysuria.  All other systems reviewed and are negative.    Physical Exam Updated Vital Signs BP (!) 147/93   Pulse 80   Temp 99 F (37.2 C) (Oral)   Resp 16   Ht 5\' 3"  (1.6 m)   Wt 77.1 kg   SpO2 99%   BMI 30.11 kg/m   Physical Exam Vitals signs and nursing note reviewed.  Constitutional:      Appearance: She is well-developed.  HENT:     Head: Normocephalic.  Neck:     Musculoskeletal: Normal range of motion.  Cardiovascular:     Rate and Rhythm: Normal rate.  Pulmonary:  Effort: Pulmonary effort is normal.  Abdominal:     General: There is no distension.  Musculoskeletal: Normal range of motion.  Skin:    General: Skin is warm.  Neurological:     General: No focal deficit present.     Mental Status: She is alert and oriented to person, place, and time.  Psychiatric:        Mood and Affect: Mood normal.      ED Treatments / Results  Labs (all labs ordered are listed, but only abnormal results are displayed) Labs Reviewed  URINALYSIS, ROUTINE W REFLEX MICROSCOPIC - Abnormal; Notable for the following components:      Result Value   Color, Urine AMBER (*)    Nitrite POSITIVE (*)    Bacteria, UA MANY (*)    All other components within normal limits  PREGNANCY, URINE  CBG MONITORING, ED    EKG None  Radiology No results found.  Procedures Procedures (including critical care time)  Medications Ordered in ED Medications - No data to display   Initial Impression / Assessment and Plan / ED Course  I have reviewed the triage vital signs and the nursing notes.  Pertinent labs &  imaging results that were available during my care of the patient were reviewed by me and considered in my medical decision making (see chart for details).        MDM  Ua shows many bact, [id number].   Rx for keflex.  First dosage here   Final Clinical Impressions(s) / ED Diagnoses   Final diagnoses:  Urinary tract infection without hematuria, site unspecified    ED Discharge Orders         Ordered    cephALEXin (KEFLEX) 500 MG capsule  4 times daily     03/27/19 2136    ibuprofen (ADVIL) 800 MG tablet  3 times daily     03/27/19 2136        An After Visit Summary was printed and given to the patient.    Sidney Ace 03/27/19 2137    Maudie Flakes, MD 03/28/19 1101

## 2019-03-27 NOTE — Discharge Instructions (Addendum)
Return if any problems.  Drink plenty of fluids.   °

## 2019-03-27 NOTE — ED Triage Notes (Addendum)
Pt reports dysuria, frequency, urgency x 3 days. 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 Pt says she is here to be treated for UTI.

## 2019-08-25 ENCOUNTER — Encounter: Payer: Self-pay | Admitting: Physician Assistant

## 2019-08-25 ENCOUNTER — Other Ambulatory Visit: Payer: Self-pay

## 2019-08-25 ENCOUNTER — Ambulatory Visit: Payer: Self-pay | Admitting: Physician Assistant

## 2019-08-25 VITALS — BP 90/60 | HR 101 | Temp 97.3°F | Ht 63.0 in | Wt 172.8 lb

## 2019-08-25 DIAGNOSIS — Z13220 Encounter for screening for lipoid disorders: Secondary | ICD-10-CM

## 2019-08-25 DIAGNOSIS — F17200 Nicotine dependence, unspecified, uncomplicated: Secondary | ICD-10-CM

## 2019-08-25 DIAGNOSIS — F1911 Other psychoactive substance abuse, in remission: Secondary | ICD-10-CM

## 2019-08-25 DIAGNOSIS — Z8619 Personal history of other infectious and parasitic diseases: Secondary | ICD-10-CM

## 2019-08-25 DIAGNOSIS — Z131 Encounter for screening for diabetes mellitus: Secondary | ICD-10-CM

## 2019-08-25 DIAGNOSIS — E669 Obesity, unspecified: Secondary | ICD-10-CM

## 2019-08-25 DIAGNOSIS — Z7689 Persons encountering health services in other specified circumstances: Secondary | ICD-10-CM

## 2019-08-25 DIAGNOSIS — Z8632 Personal history of gestational diabetes: Secondary | ICD-10-CM

## 2019-08-25 NOTE — Progress Notes (Signed)
BP 90/60   Pulse (!) 101   Temp (!) 97.3 F (36.3 C)   Ht 5\' 3"  (1.6 m)   Wt 172 lb 12.8 oz (78.4 kg)   LMP 08/22/2019 (Exact Date)   SpO2 96%   BMI 30.61 kg/m    Subjective:    Patient ID: Anita Davis, female    DOB: 11/11/1985, 34 y.o.   MRN: [id number]  HPI: Anita Davis is a 34 y.o. female presenting on 08/25/2019 for New Patient (Initial Visit) (pt has not had PCP for about 6 years. pt recieved her first COVID vaccine on 08-06-19 pt is here establish care)   HPI   Pt had a negative covid 19 screening questionnaire.   Pt is 34yoF who presents to establish care.    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  She Says her LFTs are up (checked at [facility])  Pt insistent she is diabetic although she takes no meds and a1c in 2018 in [facility] was 5.4.    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  Pt has history of mental health issues and was getting treatment but hasn't been in a while due to Pandemic.  She says her Last PAP was in 2019 about 6 years ago    Relevant past medical, surgical, family and social history reviewed and updated as indicated. Interim medical history since our last visit reviewed. Allergies and medications reviewed and updated.   Current Outpatient Medications:  [employer]  METHADONE HCL PO, Take 55 mg by mouth daily., Disp: , Rfl:     Review of Systems  Per HPI unless specifically indicated above     Objective:    BP 90/60   Pulse (!) 101   Temp (!) 97.3 F (36.3 C)   Ht 5\' 3"  (1.6 m)   Wt 172 lb 12.8 oz (78.4 kg)   LMP 08/22/2019 (Exact Date)   SpO2 96%   BMI 30.61 kg/m   Wt Readings from Last 3 Encounters:  08/25/19 172 lb 12.8 oz (78.4 kg)  03/27/19 170 lb (77.1 kg)  07/12/18 165 lb (74.8 kg)    Physical Exam Vitals reviewed.  Constitutional:      General: She is not in acute distress.    Appearance: She is  well-developed. She is not ill-appearing.  HENT:     Head: Normocephalic and atraumatic.  Eyes:     Conjunctiva/sclera: Conjunctivae normal.     Pupils: Pupils are equal, round, and reactive to light.  Neck:     Thyroid: No thyromegaly.  Cardiovascular:     Rate and Rhythm: Normal rate and regular rhythm.  Pulmonary:     Effort: Pulmonary effort is normal.     Breath sounds: Normal breath sounds.  Abdominal:     General: Bowel sounds are normal.     Palpations: Abdomen is soft. There is no mass.     Tenderness: There is no abdominal tenderness.  Musculoskeletal:     Cervical back: Neck supple.     Right lower leg: No edema.     Left lower leg: No edema.  Lymphadenopathy:     Cervical: No cervical adenopathy.  Skin:    General: Skin is warm and dry.  Neurological:     Mental Status: She is alert and oriented to person, place, and time.     Gait: Gait normal.  Psychiatric:        Attention and Perception: Attention normal.        Speech: Speech normal.        Behavior: Behavior normal. Behavior is cooperative.         Assessment & Plan:    Encounter Diagnoses  Name Primary?  . Encounter to establish care Yes  . 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 Current smoker   . Obesity, unspecified classification, unspecified obesity type, unspecified whether serious comorbidity present   . History of gestational diabetes   . Screening cholesterol level   . History of hepatitis C   . Screening for diabetes mellitus      -will get baseline Labs -encouraged pt to re-establish with [facility].  She was given inforamtion for [facility] but  Pt [facility] to go [place] [facility] [place] -pt is given CAFA / application for financial assistance through [facility] as she will likely need referral to GI for hepatitis treatment -Will plan to update PAP sometime later this year.   - pt to get 2nd covid as scheduled -pt to follow up 1 month.  She is to contact office sooner prn

## 2019-09-18 ENCOUNTER — Other Ambulatory Visit (HOSPITAL_COMMUNITY)
Admission: RE | Admit: 2019-09-18 | Discharge: 2019-09-18 | Disposition: A | Discharge status: Home / Self Care | Payer: Self-pay | Source: Ambulatory Visit | Attending: Physician Assistant | Admitting: Physician Assistant

## 2019-09-18 ENCOUNTER — Other Ambulatory Visit: Payer: Self-pay

## 2019-09-18 DIAGNOSIS — Z8619 Personal history of other infectious and parasitic diseases: Secondary | ICD-10-CM | POA: Insufficient documentation

## 2019-09-18 DIAGNOSIS — Z131 Encounter for screening for diabetes mellitus: Secondary | ICD-10-CM | POA: Insufficient documentation

## 2019-09-18 DIAGNOSIS — Z8632 Personal history of gestational diabetes: Secondary | ICD-10-CM | POA: Insufficient documentation

## 2019-09-18 DIAGNOSIS — Z13220 Encounter for screening for lipoid disorders: Secondary | ICD-10-CM | POA: Insufficient documentation

## 2019-09-18 DIAGNOSIS — F1911 Other psychoactive substance abuse, in remission: Secondary | ICD-10-CM | POA: Insufficient documentation

## 2019-09-18 LAB — COMPREHENSIVE METABOLIC PANEL
ALT: 71 U/L — ABNORMAL HIGH (ref 0–44)
AST: 78 U/L — ABNORMAL HIGH (ref 15–41)
Albumin: 4 g/dL (ref 3.5–5.0)
Alkaline Phosphatase: 75 U/L (ref 38–126)
Anion gap: 11 (ref 5–15)
BUN: 13 mg/dL (ref 6–20)
CO2: 27 mmol/L (ref 22–32)
Calcium: 9.2 mg/dL (ref 8.9–10.3)
Chloride: 100 mmol/L (ref 98–111)
Creatinine, Ser: 0.77 mg/dL (ref 0.44–1.00)
GFR calc Af Amer: >60 mL/min (ref >60)
GFR calc non Af Amer: >60 mL/min (ref >60)
Glucose, Bld: 105 mg/dL — ABNORMAL HIGH (ref 70–99)
Potassium: 4.1 mmol/L (ref 3.5–5.1)
Sodium: 138 mmol/L (ref 135–145)
Total Bilirubin: 0.5 mg/dL (ref 0.3–1.2)
Total Protein: 8 g/dL (ref 6.5–8.1)

## 2019-09-18 LAB — LIPID PANEL
Cholesterol: 209 mg/dL — ABNORMAL HIGH (ref 0–200)
HDL: 49 mg/dL (ref >40)
LDL Cholesterol: 131 mg/dL — ABNORMAL HIGH (ref 0–99)
Total CHOL/HDL Ratio: 4.3 RATIO
Triglycerides: 146 mg/dL (ref <150)
VLDL: 29 mg/dL (ref 0–40)

## 2019-09-18 LAB — HEPATITIS C ANTIBODY: HCV Ab: REACTIVE — AB

## 2019-09-18 LAB — HEMOGLOBIN A1C
Hgb A1c MFr Bld: 5.9 % — ABNORMAL HIGH (ref 4.8–5.6)
Mean Plasma Glucose: 122.63 mg/dL

## 2019-09-30 ENCOUNTER — Encounter: Payer: Self-pay | Admitting: Physician Assistant

## 2019-09-30 ENCOUNTER — Ambulatory Visit: Payer: Self-pay | Admitting: Physician Assistant

## 2019-09-30 ENCOUNTER — Other Ambulatory Visit: Payer: Self-pay

## 2019-09-30 VITALS — BP 142/81 | HR 116 | Temp 97.9°F | Wt 178.8 lb

## 2019-09-30 DIAGNOSIS — E785 Hyperlipidemia, unspecified: Secondary | ICD-10-CM

## 2019-09-30 DIAGNOSIS — R7989 Other specified abnormal findings of blood chemistry: Secondary | ICD-10-CM

## 2019-09-30 DIAGNOSIS — F39 Unspecified mood [affective] disorder: Secondary | ICD-10-CM

## 2019-09-30 DIAGNOSIS — K5900 Constipation, unspecified: Secondary | ICD-10-CM

## 2019-09-30 DIAGNOSIS — B1920 Unspecified viral hepatitis C without hepatic coma: Secondary | ICD-10-CM

## 2019-09-30 DIAGNOSIS — F17200 Nicotine dependence, unspecified, uncomplicated: Secondary | ICD-10-CM

## 2019-09-30 DIAGNOSIS — R112 Nausea with vomiting, unspecified: Secondary | ICD-10-CM

## 2019-09-30 DIAGNOSIS — R7303 Prediabetes: Secondary | ICD-10-CM

## 2019-09-30 NOTE — Patient Instructions (Addendum)
Prediabetes Prediabetes is the condition of having a blood sugar (blood glucose) level that is higher than it should be, but not high enough for you to be diagnosed with type 2 diabetes. Having prediabetes puts you at risk for developing type 2 diabetes (type 2 diabetes mellitus). Prediabetes may be called impaired glucose tolerance or impaired fasting glucose. Prediabetes usually does not cause symptoms. Your health care provider can diagnose this condition with blood tests. You may be tested for prediabetes if you are overweight and if you have at least one other risk factor for prediabetes. What is blood glucose, and how is it measured? Blood glucose refers to the amount of glucose in your bloodstream. Glucose comes from eating foods that contain sugars and starches (carbohydrates), which the body breaks down into glucose. Your blood glucose level may be measured in mg/dL (milligrams per deciliter) or mmol/L (millimoles per liter). Your blood glucose may be checked with one or more of the following blood tests:  A fasting blood glucose (FBG) test. You will not be allowed to eat (you will fast) for 8 hours or longer before a blood sample is taken. ? A normal range for FBG is 70-100 mg/dl (3.9-5.6 mmol/L).  An A1c (hemoglobin A1c) blood test. This test provides information about blood glucose control over the previous 2?3months.  An oral glucose tolerance test (OGTT). This test measures your blood glucose at two times: ? After fasting. This is your baseline level. ? Two hours after you drink a beverage that contains glucose. You may be diagnosed with prediabetes:  If your FBG is 100?125 mg/dL (5.6-6.9 mmol/L).  If your A1c level is 5.7?6.4%.  If your OGTT result is 140?199 mg/dL (7.8-11 mmol/L). These blood tests may be repeated to confirm your diagnosis. How can this condition affect me? The pancreas produces a hormone (insulin) that helps to move glucose from the bloodstream into cells.  When cells in the body do not respond properly to insulin that the body makes (insulin resistance), excess glucose builds up in the blood instead of going into cells. As a result, high blood glucose (hyperglycemia) can develop, which can cause many complications. Hyperglycemia is a symptom of prediabetes. Having high blood glucose for a long time is dangerous. Too much glucose in your blood can damage your nerves and blood vessels. Long-term damage can lead to complications from diabetes, which may include:  Heart disease.  Stroke.  Blindness.  Kidney disease.  Depression.  Poor circulation in the feet and legs, which could lead to surgical removal (amputation) in severe cases. What can increase my risk? Risk factors for prediabetes include:  Having a family member with type 2 diabetes.  Being overweight or obese.  Being older than age 45.  Being of American Indian, African-American, Hispanic/Latino, or Asian/Pacific Islander descent.  Having an inactive (sedentary) lifestyle.  Having a history of heart disease.  History of gestational diabetes or polycystic ovary syndrome (PCOS), in women.  Having low levels of good cholesterol (HDL-C) or high levels of blood fats (triglycerides).  Having high blood pressure. What actions can I take to prevent diabetes?      Be physically active. ? Do moderate-intensity physical activity for 30 or more minutes on 5 or more days of the week, or as much as told by your health care provider. This could be brisk walking, biking, or water aerobics. ? Ask your health care provider what activities are safe for you. A mix of physical activities may be best, such as   walking, swimming, cycling, and strength training.  Lose weight as told by your health care provider. ? Losing 5-7% of your body weight can reverse insulin resistance. ? Your health care provider can determine how much weight loss is best for you and can help you lose weight  safely.  Follow a healthy meal plan. This includes eating lean proteins, complex carbohydrates, fresh fruits and vegetables, low-fat dairy products, and healthy fats. ? Follow instructions from your health care provider about eating or drinking restrictions. ? Make an appointment to see a diet and nutrition specialist (registered dietitian) to help you create a healthy eating plan that is right for you.  Do not smoke or use any tobacco products, such as cigarettes, chewing tobacco, and e-cigarettes. If you need help quitting, ask your health care provider.  Take over-the-counter and prescription medicines as told by your health care provider. You may be prescribed medicines that help lower the risk of type 2 diabetes.  Keep all follow-up visits as told by your health care provider. This is important. Summary  Prediabetes is the condition of having a blood sugar (blood glucose) level that is higher than it should be, but not high enough for you to be diagnosed with type 2 diabetes.  Having prediabetes puts you at risk for developing type 2 diabetes (type 2 diabetes mellitus).  To help prevent type 2 diabetes, make lifestyle changes such as being physically active and eating a healthy diet. Lose weight as told by your health care provider. This information is not intended to replace advice given to you by your health care provider. Make sure you discuss any questions you have with your health care provider. Document Revised: 08/01/2018 Document Reviewed: 05/31/2015 Elsevier Patient Education  2020 [employer].  ---------------------------------------------------------------------------------------------    High Cholesterol  High cholesterol is a condition in which the blood has high levels of a white, waxy, fat-like substance (cholesterol). The human body needs small amounts of cholesterol. The liver makes all the cholesterol that the body needs. Extra (excess) cholesterol comes from the  food that we eat. Cholesterol is carried from the liver by the blood through the blood vessels. If you have high cholesterol, deposits (plaques) may build up on the walls of your blood vessels (arteries). Plaques make the arteries narrower and stiffer. Cholesterol plaques increase your risk for heart attack and stroke. Work with your health care provider to keep your cholesterol levels in a healthy range. What increases the risk? This condition is more likely to develop in people who:  Eat foods that are high in animal fat (saturated fat) or cholesterol.  Are overweight.  Are not getting enough exercise.  Have a family history of high cholesterol. What are the signs or symptoms? There are no symptoms of this condition. How is this diagnosed? This condition may be diagnosed from the results of a blood test.  If you are older than age 73, your health care provider may check your cholesterol every 4-6 years.  You may be checked more often if you already have high cholesterol or other risk factors for heart disease. The blood test for cholesterol measures:  "Bad" cholesterol (LDL cholesterol). This is the main type of cholesterol that causes heart disease. The desired level for LDL is less than 100.  "Good" cholesterol (HDL cholesterol). This type helps to protect against heart disease by cleaning the arteries and carrying the LDL away. The desired level for HDL is 60 or higher.  Triglycerides. These are fats that  the body can store or burn for energy. The desired number for triglycerides is lower than 150.  Total cholesterol. This is a measure of the total amount of cholesterol in your blood, including LDL cholesterol, HDL cholesterol, and triglycerides. A healthy number is less than 200. How is this treated? This condition is treated with diet changes, lifestyle changes, and medicines. Diet changes  This may include eating more whole grains, fruits, vegetables, nuts, and fish.  This  may also include cutting back on red meat and foods that have a lot of added sugar. Lifestyle changes  Changes may include getting at least 40 minutes of aerobic exercise 3 times a week. Aerobic exercises include walking, biking, and swimming. Aerobic exercise along with a healthy diet can help you maintain a healthy weight.  Changes may also include quitting smoking. Medicines  Medicines are usually given if diet and lifestyle changes have failed to reduce your cholesterol to healthy levels.  Your health care provider may prescribe a statin medicine. Statin medicines have been shown to reduce cholesterol, which can reduce the risk of heart disease. Follow these instructions at home: Eating and drinking If told by your health care provider:  Eat chicken (without skin), fish, veal, shellfish, ground [place] breast, and round or loin cuts of red meat.  Do not eat fried foods or fatty meats, such as hot dogs and salami.  Eat plenty of fruits, such as apples.  Eat plenty of vegetables, such as broccoli, potatoes, and carrots.  Eat beans, peas, and lentils.  Eat grains such as barley, rice, couscous, and bulgur wheat.  Eat pasta without cream sauces.  Use skim or nonfat milk, and eat low-fat or nonfat yogurt and cheeses.  Do not eat or drink whole milk, cream, ice cream, egg yolks, or hard cheeses.  Do not eat stick margarine or tub margarines that contain trans fats (also called partially hydrogenated oils).  Do not eat saturated tropical oils, such as coconut oil and palm oil.  Do not eat cakes, cookies, crackers, or other baked goods that contain trans fats.  General instructions  Exercise as directed by your health care provider. Increase your activity level with activities such as gardening, walking, and taking the stairs.  Take over-the-counter and prescription medicines only as told by your health care provider.  Do not use any products that contain nicotine or tobacco,  such as cigarettes and e-cigarettes. If you need help quitting, ask your health care provider.  Keep all follow-up visits as told by your health care provider. This is important. Contact a health care provider if:  You are struggling to maintain a healthy diet or weight.  You need help to start on an exercise program.  You need help to stop smoking. Get help right away if:  You have chest pain.  You have trouble breathing. This information is not intended to replace advice given to you by your health care provider. Make sure you discuss any questions you have with your health care provider. Document Revised: 04/12/2017 Document Reviewed: 10/08/2015 Elsevier Patient Education  2020 [employer].

## 2019-09-30 NOTE — Progress Notes (Signed)
BP (!) 142/81    Pulse (!) 116    Temp 97.9 F (36.6 C)    Wt 178 lb 12.8 oz (81.1 kg)    LMP 09/16/2019    SpO2 98%    BMI 31.67 kg/m    Subjective:    Patient ID: Anita Davis, female    DOB: 1985-09-11, 34 y.o.   MRN: [id number]  HPI: Anita Davis is a 34 y.o. female presenting on 09/30/2019 for No chief complaint on file.   HPI  Pt had a negative covid 19 screening questionnaire.     Pt is going to [facility] for substance abuse issues and pain management  Pt didn't contact [facility] yet.  she  Says [facility] won't do virtual treatment so she is planning to go to [facility].    She says she submitted her [employer] financial application  Pt c/o nausea.  She says she had a stomach bug just less than a month ago.  She reports emesis.  Says it's in the mornings when she awakens.  She is throwing up usually just about once a day.  She is using lots of organic things like ginger tea.     She says she gets feeling better after she throws up and "gets it all out".    BMs not normal.   She uses miralax and lots of water which seem to help her constipation.    She is not working currently.   Pt is not currently sexually active.             Relevant past medical, surgical, family and social history reviewed and updated as indicated. Interim medical history since our last visit reviewed. Allergies and medications reviewed and updated.   Current Outpatient Medications:    cetirizine (ZYRTEC) 10 MG tablet, Take 10 mg by mouth daily., Disp: , Rfl:    ibuprofen (ADVIL) 200 MG tablet, Take 200 mg by mouth every 6 (six) hours as needed., Disp: , Rfl:    METHADONE HCL PO, Take 60 mg by mouth daily. , Disp: , Rfl:    Polyethylene Glycol 3350 (MIRALAX PO), Take by mouth., Disp: , Rfl:      Review of Systems  Per HPI unless specifically indicated above     Objective:    BP (!) 142/81    Pulse (!) 116    Temp 97.9 F (36.6 C)    Wt 178 lb 12.8 oz (81.1 kg)    LMP 09/16/2019     SpO2 98%    BMI 31.67 kg/m   Wt Readings from Last 3 Encounters:  09/30/19 178 lb 12.8 oz (81.1 kg)  08/25/19 172 lb 12.8 oz (78.4 kg)  03/27/19 170 lb (77.1 kg)    Physical Exam Vitals reviewed.  Constitutional:      General: She is not in acute distress.    Appearance: She is well-developed. She is not toxic-appearing.  HENT:     Head: Normocephalic and atraumatic.  Cardiovascular:     Rate and Rhythm: Normal rate and regular rhythm.  Pulmonary:     Effort: Pulmonary effort is normal. No respiratory distress.     Breath sounds: Normal breath sounds.  Abdominal:     General: Bowel sounds are normal.     Palpations: Abdomen is soft. There is no mass.     Tenderness: There is no abdominal tenderness.  Musculoskeletal:     Cervical back: Neck supple.     Right lower leg: No edema.  Left lower leg: No edema.  Lymphadenopathy:     Cervical: No cervical adenopathy.  Skin:    General: Skin is warm and dry.  Neurological:     Mental Status: She is alert and oriented to person, place, and time.  Psychiatric:        Behavior: Behavior is cooperative.     Comments: Pt very hyper at start of appointment with rapid and pressured speech.  After request that she stop and wait for a minute and the continue slower, she became more calm and was talking normally.       Results for orders placed or performed during the hospital encounter of 09/18/19  Lipid panel  Result Value Ref Range   Cholesterol 209 (H) 0 - 200 mg/dL   Triglycerides 749 <449 mg/dL   HDL 49 >67 mg/dL   Total CHOL/HDL Ratio 4.3 RATIO   VLDL 29 0 - 40 mg/dL   LDL Cholesterol 591 (H) 0 - 99 mg/dL  Hemoglobin M3W  Result Value Ref Range   Hgb A1c MFr Bld 5.9 (H) 4.8 - 5.6 %   Mean Plasma Glucose 122.63 mg/dL  Comprehensive metabolic panel  Result Value Ref Range   Sodium 138 135 - 145 mmol/L   Potassium 4.1 3.5 - 5.1 mmol/L   Chloride 100 98 - 111 mmol/L   CO2 27 22 - 32 mmol/L   Glucose, Bld 105 (H) 70 - 99  mg/dL   BUN 13 6 - 20 mg/dL   Creatinine, Ser 4.66 0.44 - 1.00 mg/dL   Calcium 9.2 8.9 - 59.9 mg/dL   Total Protein 8.0 6.5 - 8.1 g/dL   Albumin 4.0 3.5 - 5.0 g/dL   AST 78 (H) 15 - 41 U/L   ALT 71 (H) 0 - 44 U/L   Alkaline Phosphatase 75 38 - 126 U/L   Total Bilirubin 0.5 0.3 - 1.2 mg/dL   GFR calc non Af Amer >60 >60 mL/min   GFR calc Af Amer >60 >60 mL/min   Anion gap 11 5 - 15  Hepatitis C Antibody  Result Value Ref Range   HCV Ab Reactive (A) NON REACTIVE      Assessment & Plan:    Encounter Diagnoses  Name Primary?   Hepatitis C virus infection without hepatic coma, unspecified chronicity Yes   Mood disorder (HCC)    Elevated LFTs    Non-intractable vomiting with nausea, unspecified vomiting type    Hyperlipidemia, unspecified hyperlipidemia type    Prediabetes    Constipation, unspecified constipation type    Tobacco use disorder      -reviewed labs with pt   -pt encouraged to Get to [facility]  -Will refer to GI for hep C and nausea.   -Counseled on prediabetes.  Discussed with pt that she is not diabetic and the prediabetes is not what is the cause of her nausea and emesis  -gave Reading material on prediabetes and cholesterol  -discussed BP elevated today but was only 90/60 at last OV.  No medication for this and pt is in agreement.  -pt will follow up to Update pap in 3 - 4 months.  She is to contact office sooner prn

## 2019-10-06 ENCOUNTER — Encounter: Payer: Self-pay | Admitting: Internal Medicine

## 2019-10-22 ENCOUNTER — Encounter: Payer: Self-pay | Admitting: Physician Assistant

## 2019-11-23 NOTE — Progress Notes (Deleted)
Primary Care Physician:  [name], PA-C  Primary Gastroenterologist:  Hennie Duos. Marletta Lor, DO   No chief complaint on file.   HPI:  Anita Davis is a 34 y.o. female here   Current Outpatient Medications  Medication Sig Dispense Refill  . cetirizine (ZYRTEC) 10 MG tablet Take 10 mg by mouth daily.    Marland Kitchen ibuprofen (ADVIL) 200 MG tablet Take 200 mg by mouth every 6 (six) hours as needed.    . METHADONE HCL PO Take 60 mg by mouth daily.     . Polyethylene Glycol 3350 (MIRALAX PO) Take by mouth.     No current facility-administered medications for this visit.    Allergies as of 11/24/2019  . (No Known Allergies)    Past Medical History:  Diagnosis Date  . Allergy   . Bronchitis   . Hepatitis-C   . History of gestational diabetes   . HPV in female   . Substance abuse [facility])     Past Surgical History:  Procedure Laterality Date  . HEMORRHOID BANDING  age 64    Family History  Problem Relation Age of Onset  . Depression Mother   . Cervical cancer Mother   . Hypertension Father   . Diabetes Father     Social History   Socioeconomic History  . Marital status: Single    Spouse name: Not on file  . Number of children: Not on file  . Years of education: Not on file  . Highest education level: Not on file  Occupational History  . Not on file  Tobacco Use  . Smoking status: Current Every Day Smoker    Years: 24.00    Types: Cigarettes, Cigars  . Smokeless tobacco: Never Used  . Tobacco comment: 1 cigar/daily. former 1 pack cigarette smoker quit 05/2019  currently smokes CBD  Vaping Use  . Vaping Use: Every day  . Substances: Nicotine, Flavoring  Substance and Sexual Activity  . Alcohol use: No  . Drug use: Not Currently    Types: IV, Cocaine, Hydrocodone, Oxycodone, Marijuana, Heroin    Comment: takes methadone- now taking subutex (03/27/19)  . Sexual activity: Not Currently    Birth control/protection: Abstinence  Other Topics Concern  . Not on file   Social History Narrative  . Not on file   Social Determinants of Health   Financial Resource Strain:   . Difficulty of Paying Living Expenses:   Food Insecurity:   . Worried About Programme researcher, broadcasting/film/video in the Last Year:   . Barista in the Last Year:   Transportation Needs:   . Freight forwarder (Medical):   Marland Kitchen Lack of Transportation (Non-Medical):   Physical Activity:   . Days of Exercise per Week:   . Minutes of Exercise per Session:   Stress:   . Feeling of Stress :   Social Connections:   . Frequency of Communication with Friends and Family:   . Frequency of Social Gatherings with Friends and Family:   . Attends Religious Services:   . Active Member of Clubs or Organizations:   . Attends Banker Meetings:   Marland Kitchen Marital Status:   Intimate Partner Violence:   . Fear of Current or Ex-Partner:   . Emotionally Abused:   Marland Kitchen Physically Abused:   . Sexually Abused:       ROS:  General: Negative for anorexia, weight loss, fever, chills, fatigue, weakness. Eyes: Negative for vision changes.  ENT: Negative for hoarseness, difficulty  swallowing , nasal congestion. CV: Negative for chest pain, angina, palpitations, dyspnea on exertion, peripheral edema.  Respiratory: Negative for dyspnea at rest, dyspnea on exertion, cough, sputum, wheezing.  GI: See history of present illness. GU:  Negative for dysuria, hematuria, urinary incontinence, urinary frequency, nocturnal urination.  MS: Negative for joint pain, low back pain.  Derm: Negative for rash or itching.  Neuro: Negative for weakness, abnormal sensation, seizure, frequent headaches, memory loss, confusion.  Psych: Negative for anxiety, depression, suicidal ideation, hallucinations.  Endo: Negative for unusual weight change.  Heme: Negative for bruising or bleeding. Allergy: Negative for rash or hives.    Physical Examination:  There were no vitals taken for this visit.   General: Well-nourished,  well-developed in no acute distress.  Head: Normocephalic, atraumatic.   Eyes: Conjunctiva pink, no icterus. Mouth: Oropharyngeal mucosa moist and pink , no lesions erythema or exudate. Neck: Supple without thyromegaly, masses, or lymphadenopathy.  Lungs: Clear to auscultation bilaterally.  Heart: Regular rate and rhythm, no murmurs rubs or gallops.  Abdomen: Bowel sounds are normal, nontender, nondistended, no hepatosplenomegaly or masses, no abdominal bruits or    hernia , no rebound or guarding.   Rectal: *** Extremities: No lower extremity edema. No clubbing or deformities.  Neuro: Alert and oriented x 4 , grossly normal neurologically.  Skin: Warm and dry, no rash or jaundice.   Psych: Alert and cooperative, normal mood and affect.  Labs: ***  Imaging Studies: No results found.

## 2019-11-24 ENCOUNTER — Encounter: Payer: Self-pay | Admitting: Internal Medicine

## 2019-11-24 ENCOUNTER — Ambulatory Visit: Payer: Self-pay | Admitting: Gastroenterology

## 2019-12-30 ENCOUNTER — Encounter (HOSPITAL_COMMUNITY): Payer: Self-pay | Admitting: *Deleted

## 2019-12-30 ENCOUNTER — Other Ambulatory Visit: Payer: Self-pay

## 2019-12-30 DIAGNOSIS — F17290 Nicotine dependence, other tobacco product, uncomplicated: Secondary | ICD-10-CM | POA: Insufficient documentation

## 2019-12-30 DIAGNOSIS — R002 Palpitations: Secondary | ICD-10-CM | POA: Insufficient documentation

## 2019-12-30 DIAGNOSIS — R000 Tachycardia, unspecified: Secondary | ICD-10-CM | POA: Insufficient documentation

## 2019-12-30 DIAGNOSIS — F17210 Nicotine dependence, cigarettes, uncomplicated: Secondary | ICD-10-CM | POA: Insufficient documentation

## 2019-12-30 DIAGNOSIS — F419 Anxiety disorder, unspecified: Secondary | ICD-10-CM | POA: Insufficient documentation

## 2019-12-30 NOTE — ED Triage Notes (Signed)
Pt feels shaky and anxious for past few days.  Pt denies SI or HI.  Pt denies drug or ETOH use.

## 2019-12-31 ENCOUNTER — Emergency Department (HOSPITAL_COMMUNITY)
Admission: EM | Admit: 2019-12-31 | Discharge: 2019-12-31 | Disposition: A | Discharge status: Home / Self Care | Payer: Self-pay | Attending: Emergency Medicine | Admitting: Emergency Medicine

## 2019-12-31 DIAGNOSIS — F410 Panic disorder [episodic paroxysmal anxiety] without agoraphobia: Secondary | ICD-10-CM

## 2019-12-31 LAB — BASIC METABOLIC PANEL
Anion gap: 11 (ref 5–15)
BUN: 8 mg/dL (ref 6–20)
CO2: 23 mmol/L (ref 22–32)
Calcium: 9 mg/dL (ref 8.9–10.3)
Chloride: 100 mmol/L (ref 98–111)
Creatinine, Ser: 0.79 mg/dL (ref 0.44–1.00)
GFR calc Af Amer: >60 mL/min (ref >60)
GFR calc non Af Amer: >60 mL/min (ref >60)
Glucose, Bld: 180 mg/dL — ABNORMAL HIGH (ref 70–99)
Potassium: 3.4 mmol/L — ABNORMAL LOW (ref 3.5–5.1)
Sodium: 134 mmol/L — ABNORMAL LOW (ref 135–145)

## 2019-12-31 LAB — CBC
HCT: 40.2 % (ref 36.0–46.0)
Hemoglobin: 13.3 g/dL (ref 12.0–15.0)
MCH: 30.9 pg (ref 26.0–34.0)
MCHC: 33.1 g/dL (ref 30.0–36.0)
MCV: 93.3 fL (ref 80.0–100.0)
Platelets: 213 10*3/uL (ref 150–400)
RBC: 4.31 MIL/uL (ref 3.87–5.11)
RDW: 11.9 % (ref 11.5–15.5)
WBC: 5.1 10*3/uL (ref 4.0–10.5)
nRBC: 0 % (ref 0.0–0.2)

## 2019-12-31 LAB — CBG MONITORING, ED: Glucose-Capillary: 228 mg/dL — ABNORMAL HIGH (ref 70–99)

## 2019-12-31 MED ORDER — SODIUM CHLORIDE 0.9 % IV BOLUS: 1000.0000 mL | Freq: Once | INTRAVENOUS | Status: DC

## 2019-12-31 MED ORDER — HYDROXYZINE HCL 25 MG PO TABS: 25.0000 mg | ORAL_TABLET | Freq: Four times a day (QID) | ORAL | 0 refills | Status: DC | PRN

## 2019-12-31 MED ORDER — LORAZEPAM 1 MG PO TABS
1.0000 mg | ORAL_TABLET | Freq: Once | ORAL | Status: AC
  Administered 2019-12-31: 1 mg via ORAL
  Filled 2019-12-31: qty 1

## 2019-12-31 NOTE — ED Notes (Signed)
Refused IV.

## 2019-12-31 NOTE — ED Provider Notes (Addendum)
[facility] EMERGENCY DEPARTMENT Provider Note   CSN: [id number] Arrival date & time: 12/30/19  2157     History Chief Complaint  Patient presents with  . Anxiety    Anita Davis is a 34 y.o. female.  Patient says she has been feeling anxious for a couple of days but tonight she has become severely anxious.  She reports that she feels like her heart is pounding out of her chest.  She is shaky and jittery and cannot sit still.  She tried to lay down earlier and try to calm herself but she could not.  She reports that she has never felt like this before.        Past Medical History:  Diagnosis Date  . Allergy   . Bronchitis   . Hepatitis-C   . History of gestational diabetes   . HPV in female   . Substance abuse [facility])     Patient Active Problem List   Diagnosis Date Noted  . Acute cholecystitis   . Anemia   . Abdominal pain 01/24/2017  . Hyperglycemia 01/24/2017  . Fever 01/24/2017    Past Surgical History:  Procedure Laterality Date  . HEMORRHOID BANDING  age 26     OB History    Gravida      Para      Term      Preterm      AB      Living  2     SAB      TAB      Ectopic      Multiple      Live Births              Family History  Problem Relation Age of Onset  . Depression Mother   . Cervical cancer Mother   . Hypertension Father   . Diabetes Father     Social History   Tobacco Use  . Smoking status: Current Every Day Smoker    Years: 24.00    Types: Cigarettes, Cigars  . Smokeless tobacco: Never Used  . Tobacco comment: 1 cigar/daily. former 1 pack cigarette smoker quit 05/2019  currently smokes CBD  Vaping Use  . Vaping Use: Every day  . Substances: Nicotine, Flavoring  Substance Use Topics  . Alcohol use: No  . Drug use: Not Currently    Types: IV, Cocaine, Hydrocodone, Oxycodone, Marijuana, Heroin    Comment: takes methadone- now taking subutex (03/27/19)    Home Medications Prior to Admission medications    Medication Sig Start Date End Date Taking? Authorizing Provider  cetirizine (ZYRTEC) 10 MG tablet Take 10 mg by mouth daily.    [provider]  hydrOXYzine (ATARAX/VISTARIL) 25 MG tablet Take 1-2 tablets (25-50 mg total) by mouth every 6 (six) hours as needed for anxiety. 12/31/19   Gilda Crease, MD  ibuprofen (ADVIL) 200 MG tablet Take 200 mg by mouth every 6 (six) hours as needed.    [provider]  METHADONE HCL PO Take 60 mg by mouth daily.     [provider]  Polyethylene Glycol 3350 (MIRALAX PO) Take by mouth.    [provider]    Allergies    Patient has no known allergies.  Review of Systems   Review of Systems  Cardiovascular: Positive for palpitations.  Psychiatric/Behavioral: The patient is nervous/anxious.   All other systems reviewed and are negative.   Physical Exam Updated Vital Signs BP 131/90 (BP Location: Right Arm)  Pulse 76   Temp 98.1 F (36.7 C) (Oral)   Resp 16   Ht 5\' 3"  (1.6 m)   Wt 77.1 kg   LMP 12/12/2019   SpO2 97%   BMI 30.11 kg/m   Physical Exam Vitals and nursing note reviewed.  Constitutional:      General: She is not in acute distress.    Appearance: Normal appearance. She is well-developed.  HENT:     Head: Normocephalic and atraumatic.     Right Ear: Hearing normal.     Left Ear: Hearing normal.     Nose: Nose normal.  Eyes:     Conjunctiva/sclera: Conjunctivae normal.     Pupils: Pupils are equal, round, and reactive to light.  Cardiovascular:     Rate and Rhythm: Regular rhythm. Tachycardia present.     Heart sounds: S1 normal and S2 normal. No murmur heard.  No friction rub. No gallop.   Pulmonary:     Effort: Pulmonary effort is normal. No respiratory distress.     Breath sounds: Normal breath sounds.  Chest:     Chest wall: No tenderness.  Abdominal:     General: Bowel sounds are normal.     Palpations: Abdomen is soft.     Tenderness: There is no abdominal tenderness.  There is no guarding or rebound. Negative signs include Murphy's sign and McBurney's sign.     Hernia: No hernia is present.  Musculoskeletal:        General: Normal range of motion.     Cervical back: Normal range of motion and neck supple.  Skin:    General: Skin is warm and dry.     Findings: No rash.  Neurological:     Mental Status: She is alert and oriented to person, place, and time.     GCS: GCS eye subscore is 4. GCS verbal subscore is 5. GCS motor subscore is 6.     Cranial Nerves: No cranial nerve deficit.     Sensory: No sensory deficit.     Coordination: Coordination normal.  Psychiatric:        Mood and Affect: Mood is anxious.        Speech: Speech normal.        Behavior: Behavior normal.        Thought Content: Thought content normal.     ED Results / Procedures / Treatments   Labs (all labs ordered are listed, but only abnormal results are displayed) Labs Reviewed  BASIC METABOLIC PANEL - Abnormal; Notable for the following components:      Result Value   Sodium 134 (*)    Potassium 3.4 (*)    Glucose, Bld 180 (*)    All other components within normal limits  CBG MONITORING, ED - Abnormal; Notable for the following components:   Glucose-Capillary 228 (*)    All other components within normal limits  CBC    EKG EKG Interpretation  Date/Time:  Thursday December 31 2019 03:37:04 EDT Ventricular Rate:  87 PR Interval:    QRS Duration: 87 QT Interval:  380 QTC Calculation: 458 R Axis:   88 Text Interpretation: Sinus rhythm Right atrial enlargement Minimal ST depression, inferior leads Confirmed by 10-15-1980 [phone]) on 12/31/2019 3:45:16 AM   Radiology No results found.  Procedures Procedures (including critical care time)  Medications Ordered in ED Medications  sodium chloride 0.9 % bolus 1,000 mL (1,000 mLs Intravenous Refused 12/31/19 0249)  LORazepam (ATIVAN) tablet 1 mg (1  mg Oral Given 12/31/19 0240)    ED Course  I have  reviewed the triage vital signs and the nursing notes.  Pertinent labs & imaging results that were available during my care of the patient were reviewed by me and considered in my medical decision making (see chart for details).    MDM Rules/Calculators/A&P                          Patient presents to the emergency department for evaluation of severe anxiety.  Patient reports that she has not been feeling anxious for a couple of days but tonight she has been experiencing a pounding in her chest, feeling short of breath, extreme anxiety.  She does appear very anxious.  Vital signs are normal, however.  No tachycardia.  EKG unremarkable.  Electrolytes and basic work-up are negative.  She has had some relief with Ativan, will continue hydroxyzine as needed for anxiety, follow-up with primary care.  Final Clinical Impression(s) / ED Diagnoses Final diagnoses:  Anxiety attack    Rx / DC Orders ED Discharge Orders         Ordered    hydrOXYzine (ATARAX/VISTARIL) 25 MG tablet  Every 6 hours PRN        12/31/19 0344           Gilda Crease, MD 12/31/19 [id number]    Gilda Crease, MD 12/31/19 0345

## 2020-01-01 ENCOUNTER — Encounter (HOSPITAL_COMMUNITY): Payer: Self-pay

## 2020-01-01 ENCOUNTER — Other Ambulatory Visit: Payer: Self-pay

## 2020-01-01 DIAGNOSIS — F419 Anxiety disorder, unspecified: Secondary | ICD-10-CM | POA: Insufficient documentation

## 2020-01-01 DIAGNOSIS — F17290 Nicotine dependence, other tobacco product, uncomplicated: Secondary | ICD-10-CM | POA: Insufficient documentation

## 2020-01-01 DIAGNOSIS — F17210 Nicotine dependence, cigarettes, uncomplicated: Secondary | ICD-10-CM | POA: Insufficient documentation

## 2020-01-01 NOTE — ED Triage Notes (Signed)
Pt feels anxious and shaky for the past few days. Pt denies SI or HI. Pt denies drug use or ETOH use. Pt was seen here last night for the same and prescribed Vistaril, reports she has taken 2 pills today. Pt says she feels like she "is in a daze and disoriented." pt said she stood up and got dizzy, then waking up on floor. Pt says she was by self and didn't "feel like she was out for more than a few seconds" "because the same thing was playing on TV." Pt alert and oriented x 4 in triage.

## 2020-01-02 ENCOUNTER — Emergency Department (HOSPITAL_COMMUNITY)
Admission: EM | Admit: 2020-01-02 | Discharge: 2020-01-02 | Disposition: A | Discharge status: Home / Self Care | Payer: Self-pay | Attending: Emergency Medicine | Admitting: Emergency Medicine

## 2020-01-02 DIAGNOSIS — F419 Anxiety disorder, unspecified: Secondary | ICD-10-CM

## 2020-01-02 DIAGNOSIS — G4700 Insomnia, unspecified: Secondary | ICD-10-CM

## 2020-01-02 LAB — BASIC METABOLIC PANEL
Anion gap: 12 (ref 5–15)
BUN: 7 mg/dL (ref 6–20)
CO2: 25 mmol/L (ref 22–32)
Calcium: 9.4 mg/dL (ref 8.9–10.3)
Chloride: 99 mmol/L (ref 98–111)
Creatinine, Ser: 0.76 mg/dL (ref 0.44–1.00)
GFR calc Af Amer: >60 mL/min (ref >60)
GFR calc non Af Amer: >60 mL/min (ref >60)
Glucose, Bld: 133 mg/dL — ABNORMAL HIGH (ref 70–99)
Potassium: 3.6 mmol/L (ref 3.5–5.1)
Sodium: 136 mmol/L (ref 135–145)

## 2020-01-02 LAB — TROPONIN I (HIGH SENSITIVITY): Troponin I (High Sensitivity): 2 ng/L (ref <18)

## 2020-01-02 MED ORDER — LORAZEPAM 1 MG PO TABS
2.0000 mg | ORAL_TABLET | Freq: Once | ORAL | Status: AC
  Administered 2020-01-02: 2 mg via ORAL
  Filled 2020-01-02: qty 2

## 2020-01-02 NOTE — Discharge Instructions (Addendum)
Take Benadryl 50 mg at night as this may make you drowsy and help you sleep.  Follow-up with your primary doctor later this week if symptoms are not improving.

## 2020-01-02 NOTE — ED Provider Notes (Signed)
[facility] EMERGENCY DEPARTMENT Provider Note   CSN: [id number] Arrival date & time: 01/01/20  2114     History Chief Complaint  Patient presents with   Anxiety    Anita Davis is a 34 y.o. female.  Patient is a 34 year old female with history of hepatitis C, substance abuse on methadone.  She presents today for evaluation of feeling anxious and being unable to sleep this is been ongoing for the past several days.  She was seen here 2 nights ago with similar complaints.  She was prescribed Vistaril, however this has not helped.  Patient states that she feels dizzy and generally unwell.  She denies to be any drug or alcohol use.  She denies any significant life stressors.  The history is provided by the patient.       Past Medical History:  Diagnosis Date   Allergy    Bronchitis    Hepatitis-C    History of gestational diabetes    HPV in female    Substance abuse [facility])     Patient Active Problem List   Diagnosis Date Noted   Acute cholecystitis    Anemia    Abdominal pain 01/24/2017   Hyperglycemia 01/24/2017   Fever 01/24/2017    Past Surgical History:  Procedure Laterality Date   HEMORRHOID BANDING  age 60     OB History    Gravida      Para      Term      Preterm      AB      Living  2     SAB      TAB      Ectopic      Multiple      Live Births              Family History  Problem Relation Age of Onset   Depression Mother    Cervical cancer Mother    Hypertension Father    Diabetes Father     Social History   Tobacco Use   Smoking status: Current Every Day Smoker    Years: 24.00    Types: Cigarettes, Cigars   Smokeless tobacco: Never Used   Tobacco comment: 1 cigar/daily. former 1 pack cigarette smoker quit 05/2019  currently smokes CBD  Vaping Use   Vaping Use: Every day   Substances: Nicotine, Flavoring  Substance Use Topics   Alcohol use: No   Drug use: Not Currently    Types: IV,  Cocaine, Hydrocodone, Oxycodone, Marijuana, Heroin    Comment: takes methadone- now taking subutex (03/27/19)    Home Medications Prior to Admission medications   Medication Sig Start Date End Date Taking? Authorizing Provider  cetirizine (ZYRTEC) 10 MG tablet Take 10 mg by mouth daily.    [provider]  hydrOXYzine (ATARAX/VISTARIL) 25 MG tablet Take 1-2 tablets (25-50 mg total) by mouth every 6 (six) hours as needed for anxiety. 12/31/19   Gilda Crease, MD  ibuprofen (ADVIL) 200 MG tablet Take 200 mg by mouth every 6 (six) hours as needed.    [provider]  METHADONE HCL PO Take 60 mg by mouth daily.     [provider]  Polyethylene Glycol 3350 (MIRALAX PO) Take by mouth.    [provider]    Allergies    Patient has no known allergies.  Review of Systems   Review of Systems  All other systems reviewed and are negative.   Physical Exam Updated  Vital Signs BP 140/81 (BP Location: Right Arm)    Pulse (!) 105    Temp 98.5 F (36.9 C) (Oral)    Resp 18    Ht 5\' 3"  (1.6 m)    Wt 77.1 kg    LMP 12/12/2019    SpO2 98%    BMI 30.11 kg/m   Physical Exam Vitals and nursing note reviewed.  Constitutional:      General: She is not in acute distress.    Appearance: She is well-developed. She is not diaphoretic.  HENT:     Head: Normocephalic and atraumatic.  Cardiovascular:     Rate and Rhythm: Normal rate and regular rhythm.     Heart sounds: No murmur heard.  No friction rub. No gallop.   Pulmonary:     Effort: Pulmonary effort is normal. No respiratory distress.     Breath sounds: Normal breath sounds. No wheezing.  Abdominal:     General: Bowel sounds are normal. There is no distension.     Palpations: Abdomen is soft.     Tenderness: There is no abdominal tenderness.  Musculoskeletal:        General: Normal range of motion.     Cervical back: Normal range of motion and neck supple.  Skin:    General: Skin is warm and dry.   Neurological:     Mental Status: She is alert and oriented to person, place, and time.     ED Results / Procedures / Treatments   Labs (all labs ordered are listed, but only abnormal results are displayed) Labs Reviewed  BASIC METABOLIC PANEL  TROPONIN I (HIGH SENSITIVITY)    EKG EKG Interpretation  Date/Time:  Saturday January 02 2020 02:10:27 EDT Ventricular Rate:  89 PR Interval:    QRS Duration: 89 QT Interval:  392 QTC Calculation: 477 R Axis:   88 Text Interpretation: Sinus rhythm LAE, consider biatrial enlargement Borderline T abnormalities, anterior leads Confirmed by [id number] (Geoffery Lyons) on 01/02/2020 2:57:41 AM   Radiology No results found.  Procedures Procedures (including critical care time)  Medications Ordered in ED Medications - No data to display  ED Course  I have reviewed the triage vital signs and the nursing notes.  Pertinent labs & imaging results that were available during my care of the patient were reviewed by me and considered in my medical decision making (see chart for details).    MDM Rules/Calculators/A&P  Patient presenting here for the second time in 3 days with complaints of anxiety and difficulty sleeping.  Patient was given hydroxyzine at the last visit, however does not feel as though this is helping.  Patient seems extremely anxious here and work-up is unremarkable.  Her EKG is unchanged electrolytes are normal, and troponin is negative.  Patient given Ativan and appears to be feeling somewhat better.  At this point, patient stable for discharge with outpatient follow-up.  She is to try taking Benadryl at night to help her sleep.  Final Clinical Impression(s) / ED Diagnoses Final diagnoses:  None    Rx / DC Orders ED Discharge Orders    None       03/03/2020, MD 01/02/20 [id number]

## 2020-01-04 ENCOUNTER — Inpatient Hospital Stay (HOSPITAL_COMMUNITY)
Admission: EM | Admit: 2020-01-04 | Discharge: 2020-07-02 | DRG: 917 | Disposition: A | Discharge status: Home Health | Payer: Medicaid Other | Attending: Internal Medicine | Admitting: Internal Medicine

## 2020-01-04 ENCOUNTER — Emergency Department (HOSPITAL_COMMUNITY)
Admission: EM | Admit: 2020-01-04 | Discharge: 2020-01-04 | Disposition: A | Discharge status: Home / Self Care | Payer: Self-pay | Attending: Emergency Medicine | Admitting: Emergency Medicine

## 2020-01-04 ENCOUNTER — Other Ambulatory Visit: Payer: Self-pay

## 2020-01-04 ENCOUNTER — Encounter (HOSPITAL_COMMUNITY): Payer: Self-pay

## 2020-01-04 ENCOUNTER — Encounter (HOSPITAL_COMMUNITY): Payer: Self-pay | Admitting: Emergency Medicine

## 2020-01-04 DIAGNOSIS — K648 Other hemorrhoids: Secondary | ICD-10-CM | POA: Diagnosis present

## 2020-01-04 DIAGNOSIS — Z23 Encounter for immunization: Secondary | ICD-10-CM

## 2020-01-04 DIAGNOSIS — R338 Other retention of urine: Secondary | ICD-10-CM | POA: Diagnosis not present

## 2020-01-04 DIAGNOSIS — Z66 Do not resuscitate: Secondary | ICD-10-CM | POA: Diagnosis not present

## 2020-01-04 DIAGNOSIS — E876 Hypokalemia: Secondary | ICD-10-CM | POA: Diagnosis present

## 2020-01-04 DIAGNOSIS — Z79899 Other long term (current) drug therapy: Secondary | ICD-10-CM

## 2020-01-04 DIAGNOSIS — E778 Other disorders of glycoprotein metabolism: Secondary | ICD-10-CM | POA: Diagnosis not present

## 2020-01-04 DIAGNOSIS — R4020 Unspecified coma: Secondary | ICD-10-CM | POA: Diagnosis not present

## 2020-01-04 DIAGNOSIS — T43691A Poisoning by other psychostimulants, accidental (unintentional), initial encounter: Principal | ICD-10-CM | POA: Diagnosis present

## 2020-01-04 DIAGNOSIS — K219 Gastro-esophageal reflux disease without esophagitis: Secondary | ICD-10-CM | POA: Diagnosis present

## 2020-01-04 DIAGNOSIS — F09 Unspecified mental disorder due to known physiological condition: Secondary | ICD-10-CM

## 2020-01-04 DIAGNOSIS — S069XAA Unspecified intracranial injury with loss of consciousness status unknown, initial encounter: Secondary | ICD-10-CM | POA: Diagnosis present

## 2020-01-04 DIAGNOSIS — E860 Dehydration: Secondary | ICD-10-CM | POA: Diagnosis present

## 2020-01-04 DIAGNOSIS — B373 Candidiasis of vulva and vagina: Secondary | ICD-10-CM | POA: Diagnosis not present

## 2020-01-04 DIAGNOSIS — F202 Catatonic schizophrenia: Secondary | ICD-10-CM | POA: Diagnosis not present

## 2020-01-04 DIAGNOSIS — S069X9A Unspecified intracranial injury with loss of consciousness of unspecified duration, initial encounter: Secondary | ICD-10-CM | POA: Diagnosis present

## 2020-01-04 DIAGNOSIS — K029 Dental caries, unspecified: Secondary | ICD-10-CM | POA: Diagnosis present

## 2020-01-04 DIAGNOSIS — G40A11 Absence epileptic syndrome, intractable, with status epilepticus: Secondary | ICD-10-CM | POA: Diagnosis present

## 2020-01-04 DIAGNOSIS — F15920 Other stimulant use, unspecified with intoxication, uncomplicated: Secondary | ICD-10-CM

## 2020-01-04 DIAGNOSIS — T380X5A Adverse effect of glucocorticoids and synthetic analogues, initial encounter: Secondary | ICD-10-CM | POA: Diagnosis not present

## 2020-01-04 DIAGNOSIS — Z781 Physical restraint status: Secondary | ICD-10-CM

## 2020-01-04 DIAGNOSIS — F17210 Nicotine dependence, cigarettes, uncomplicated: Secondary | ICD-10-CM | POA: Diagnosis present

## 2020-01-04 DIAGNOSIS — Z20822 Contact with and (suspected) exposure to covid-19: Secondary | ICD-10-CM | POA: Diagnosis present

## 2020-01-04 DIAGNOSIS — N390 Urinary tract infection, site not specified: Secondary | ICD-10-CM | POA: Diagnosis present

## 2020-01-04 DIAGNOSIS — F639 Impulse disorder, unspecified: Secondary | ICD-10-CM | POA: Diagnosis present

## 2020-01-04 DIAGNOSIS — R7303 Prediabetes: Secondary | ICD-10-CM | POA: Diagnosis present

## 2020-01-04 DIAGNOSIS — F05 Delirium due to known physiological condition: Secondary | ICD-10-CM | POA: Diagnosis not present

## 2020-01-04 DIAGNOSIS — R5381 Other malaise: Secondary | ICD-10-CM | POA: Diagnosis not present

## 2020-01-04 DIAGNOSIS — S069X9S Unspecified intracranial injury with loss of consciousness of unspecified duration, sequela: Secondary | ICD-10-CM

## 2020-01-04 DIAGNOSIS — K9423 Gastrostomy malfunction: Secondary | ICD-10-CM

## 2020-01-04 DIAGNOSIS — M545 Low back pain, unspecified: Secondary | ICD-10-CM

## 2020-01-04 DIAGNOSIS — E43 Unspecified severe protein-calorie malnutrition: Secondary | ICD-10-CM | POA: Diagnosis present

## 2020-01-04 DIAGNOSIS — K9421 Gastrostomy hemorrhage: Secondary | ICD-10-CM | POA: Diagnosis not present

## 2020-01-04 DIAGNOSIS — E8809 Other disorders of plasma-protein metabolism, not elsewhere classified: Secondary | ICD-10-CM | POA: Diagnosis not present

## 2020-01-04 DIAGNOSIS — D6959 Other secondary thrombocytopenia: Secondary | ICD-10-CM | POA: Diagnosis not present

## 2020-01-04 DIAGNOSIS — T85598A Other mechanical complication of other gastrointestinal prosthetic devices, implants and grafts, initial encounter: Secondary | ICD-10-CM

## 2020-01-04 DIAGNOSIS — K567 Ileus, unspecified: Secondary | ICD-10-CM

## 2020-01-04 DIAGNOSIS — Z789 Other specified health status: Secondary | ICD-10-CM | POA: Diagnosis not present

## 2020-01-04 DIAGNOSIS — R410 Disorientation, unspecified: Secondary | ICD-10-CM | POA: Insufficient documentation

## 2020-01-04 DIAGNOSIS — Z515 Encounter for palliative care: Secondary | ICD-10-CM

## 2020-01-04 DIAGNOSIS — Z818 Family history of other mental and behavioral disorders: Secondary | ICD-10-CM

## 2020-01-04 DIAGNOSIS — R7401 Elevation of levels of liver transaminase levels: Secondary | ICD-10-CM

## 2020-01-04 DIAGNOSIS — F15159 Other stimulant abuse with stimulant-induced psychotic disorder, unspecified: Secondary | ICD-10-CM | POA: Diagnosis present

## 2020-01-04 DIAGNOSIS — N62 Hypertrophy of breast: Secondary | ICD-10-CM | POA: Diagnosis present

## 2020-01-04 DIAGNOSIS — K9420 Gastrostomy complication, unspecified: Secondary | ICD-10-CM

## 2020-01-04 DIAGNOSIS — E639 Nutritional deficiency, unspecified: Secondary | ICD-10-CM | POA: Diagnosis not present

## 2020-01-04 DIAGNOSIS — F1123 Opioid dependence with withdrawal: Secondary | ICD-10-CM | POA: Diagnosis present

## 2020-01-04 DIAGNOSIS — Y833 Surgical operation with formation of external stoma as the cause of abnormal reaction of the patient, or of later complication, without mention of misadventure at the time of the procedure: Secondary | ICD-10-CM | POA: Diagnosis not present

## 2020-01-04 DIAGNOSIS — R739 Hyperglycemia, unspecified: Secondary | ICD-10-CM | POA: Diagnosis not present

## 2020-01-04 DIAGNOSIS — A029 Salmonella infection, unspecified: Secondary | ICD-10-CM | POA: Diagnosis not present

## 2020-01-04 DIAGNOSIS — Z452 Encounter for adjustment and management of vascular access device: Secondary | ICD-10-CM

## 2020-01-04 DIAGNOSIS — K1379 Other lesions of oral mucosa: Secondary | ICD-10-CM | POA: Diagnosis present

## 2020-01-04 DIAGNOSIS — Z9181 History of falling: Secondary | ICD-10-CM

## 2020-01-04 DIAGNOSIS — G40919 Epilepsy, unspecified, intractable, without status epilepticus: Secondary | ICD-10-CM

## 2020-01-04 DIAGNOSIS — Z6827 Body mass index (BMI) 27.0-27.9, adult: Secondary | ICD-10-CM

## 2020-01-04 DIAGNOSIS — E46 Unspecified protein-calorie malnutrition: Secondary | ICD-10-CM | POA: Diagnosis not present

## 2020-01-04 DIAGNOSIS — F314 Bipolar disorder, current episode depressed, severe, without psychotic features: Secondary | ICD-10-CM | POA: Diagnosis present

## 2020-01-04 DIAGNOSIS — H5704 Mydriasis: Secondary | ICD-10-CM | POA: Diagnosis present

## 2020-01-04 DIAGNOSIS — F6389 Other impulse disorders: Secondary | ICD-10-CM | POA: Diagnosis present

## 2020-01-04 DIAGNOSIS — R140 Abdominal distension (gaseous): Secondary | ICD-10-CM

## 2020-01-04 DIAGNOSIS — R109 Unspecified abdominal pain: Secondary | ICD-10-CM

## 2020-01-04 DIAGNOSIS — Z8619 Personal history of other infectious and parasitic diseases: Secondary | ICD-10-CM

## 2020-01-04 DIAGNOSIS — R4182 Altered mental status, unspecified: Secondary | ICD-10-CM

## 2020-01-04 DIAGNOSIS — F1210 Cannabis abuse, uncomplicated: Secondary | ICD-10-CM | POA: Diagnosis present

## 2020-01-04 DIAGNOSIS — R110 Nausea: Secondary | ICD-10-CM

## 2020-01-04 DIAGNOSIS — F1910 Other psychoactive substance abuse, uncomplicated: Secondary | ICD-10-CM

## 2020-01-04 DIAGNOSIS — G9341 Metabolic encephalopathy: Secondary | ICD-10-CM | POA: Diagnosis present

## 2020-01-04 DIAGNOSIS — Z9151 Personal history of suicidal behavior: Secondary | ICD-10-CM

## 2020-01-04 DIAGNOSIS — K6389 Other specified diseases of intestine: Secondary | ICD-10-CM

## 2020-01-04 DIAGNOSIS — R1319 Other dysphagia: Secondary | ICD-10-CM | POA: Diagnosis present

## 2020-01-04 DIAGNOSIS — F0390 Unspecified dementia without behavioral disturbance: Secondary | ICD-10-CM | POA: Diagnosis present

## 2020-01-04 DIAGNOSIS — B9620 Unspecified Escherichia coli [E. coli] as the cause of diseases classified elsewhere: Secondary | ICD-10-CM | POA: Diagnosis not present

## 2020-01-04 DIAGNOSIS — E663 Overweight: Secondary | ICD-10-CM | POA: Diagnosis present

## 2020-01-04 DIAGNOSIS — R748 Abnormal levels of other serum enzymes: Secondary | ICD-10-CM | POA: Diagnosis present

## 2020-01-04 DIAGNOSIS — S069X0S Unspecified intracranial injury without loss of consciousness, sequela: Secondary | ICD-10-CM

## 2020-01-04 DIAGNOSIS — G4700 Insomnia, unspecified: Secondary | ICD-10-CM | POA: Diagnosis not present

## 2020-01-04 DIAGNOSIS — R1310 Dysphagia, unspecified: Secondary | ICD-10-CM

## 2020-01-04 DIAGNOSIS — B182 Chronic viral hepatitis C: Secondary | ICD-10-CM | POA: Diagnosis present

## 2020-01-04 DIAGNOSIS — K0889 Other specified disorders of teeth and supporting structures: Secondary | ICD-10-CM

## 2020-01-04 DIAGNOSIS — R102 Pelvic and perineal pain: Secondary | ICD-10-CM

## 2020-01-04 DIAGNOSIS — D72818 Other decreased white blood cell count: Secondary | ICD-10-CM | POA: Diagnosis present

## 2020-01-04 DIAGNOSIS — Z4659 Encounter for fitting and adjustment of other gastrointestinal appliance and device: Secondary | ICD-10-CM

## 2020-01-04 DIAGNOSIS — Z431 Encounter for attention to gastrostomy: Secondary | ICD-10-CM

## 2020-01-04 DIAGNOSIS — G40901 Epilepsy, unspecified, not intractable, with status epilepticus: Secondary | ICD-10-CM

## 2020-01-04 DIAGNOSIS — F4310 Post-traumatic stress disorder, unspecified: Secondary | ICD-10-CM | POA: Diagnosis present

## 2020-01-04 DIAGNOSIS — K5900 Constipation, unspecified: Secondary | ICD-10-CM

## 2020-01-04 LAB — COMPREHENSIVE METABOLIC PANEL
ALT: 121 U/L — ABNORMAL HIGH (ref 0–44)
ALT: 121 U/L — ABNORMAL HIGH (ref 0–44)
AST: 130 U/L — ABNORMAL HIGH (ref 15–41)
AST: 129 U/L — ABNORMAL HIGH (ref 15–41)
Albumin: 5 g/dL (ref 3.5–5.0)
Albumin: 4.8 g/dL (ref 3.5–5.0)
Alkaline Phosphatase: 63 U/L (ref 38–126)
Alkaline Phosphatase: 60 U/L (ref 38–126)
Anion gap: 15 (ref 5–15)
Anion gap: 14 (ref 5–15)
BUN: 17 mg/dL (ref 6–20)
BUN: 27 mg/dL — ABNORMAL HIGH (ref 6–20)
CO2: 23 mmol/L (ref 22–32)
CO2: 24 mmol/L (ref 22–32)
Calcium: 9.9 mg/dL (ref 8.9–10.3)
Calcium: 10.3 mg/dL (ref 8.9–10.3)
Chloride: 102 mmol/L (ref 98–111)
Chloride: 105 mmol/L (ref 98–111)
Creatinine, Ser: 0.82 mg/dL (ref 0.44–1.00)
Creatinine, Ser: 1.52 mg/dL — ABNORMAL HIGH (ref 0.44–1.00)
GFR calc Af Amer: >60 mL/min (ref >60)
GFR calc Af Amer: 51 mL/min — ABNORMAL LOW (ref >60)
GFR calc non Af Amer: >60 mL/min (ref >60)
GFR calc non Af Amer: 44 mL/min — ABNORMAL LOW (ref >60)
Glucose, Bld: 102 mg/dL — ABNORMAL HIGH (ref 70–99)
Glucose, Bld: 139 mg/dL — ABNORMAL HIGH (ref 70–99)
Potassium: 3.8 mmol/L (ref 3.5–5.1)
Potassium: 3.5 mmol/L (ref 3.5–5.1)
Sodium: 140 mmol/L (ref 135–145)
Sodium: 143 mmol/L (ref 135–145)
Total Bilirubin: 1 mg/dL (ref 0.3–1.2)
Total Bilirubin: 1.1 mg/dL (ref 0.3–1.2)
Total Protein: 9.2 g/dL — ABNORMAL HIGH (ref 6.5–8.1)
Total Protein: 9.3 g/dL — ABNORMAL HIGH (ref 6.5–8.1)

## 2020-01-04 LAB — CBC WITH DIFFERENTIAL/PLATELET
Abs Immature Granulocytes: 0.01 10*3/uL (ref 0.00–0.07)
Abs Immature Granulocytes: 0.02 10*3/uL (ref 0.00–0.07)
Basophils Absolute: 0 10*3/uL (ref 0.0–0.1)
Basophils Absolute: 0 10*3/uL (ref 0.0–0.1)
Basophils Relative: 0 %
Basophils Relative: 0 %
Eosinophils Absolute: 0 10*3/uL (ref 0.0–0.5)
Eosinophils Absolute: 0 10*3/uL (ref 0.0–0.5)
Eosinophils Relative: 0 %
Eosinophils Relative: 0 %
HCT: 49.1 % — ABNORMAL HIGH (ref 36.0–46.0)
HCT: 47.7 % — ABNORMAL HIGH (ref 36.0–46.0)
Hemoglobin: 16 g/dL — ABNORMAL HIGH (ref 12.0–15.0)
Hemoglobin: 16 g/dL — ABNORMAL HIGH (ref 12.0–15.0)
Immature Granulocytes: 0 %
Immature Granulocytes: 0 %
Lymphocytes Relative: 36 %
Lymphocytes Relative: 28 %
Lymphs Abs: 1.7 10*3/uL (ref 0.7–4.0)
Lymphs Abs: 2 10*3/uL (ref 0.7–4.0)
MCH: 30.9 pg (ref 26.0–34.0)
MCH: 30.7 pg (ref 26.0–34.0)
MCHC: 32.6 g/dL (ref 30.0–36.0)
MCHC: 33.5 g/dL (ref 30.0–36.0)
MCV: 94.8 fL (ref 80.0–100.0)
MCV: 91.6 fL (ref 80.0–100.0)
Monocytes Absolute: 0.4 10*3/uL (ref 0.1–1.0)
Monocytes Absolute: 0.6 10*3/uL (ref 0.1–1.0)
Monocytes Relative: 9 %
Monocytes Relative: 8 %
Neutro Abs: 2.5 10*3/uL (ref 1.7–7.7)
Neutro Abs: 4.8 10*3/uL (ref 1.7–7.7)
Neutrophils Relative %: 55 %
Neutrophils Relative %: 64 %
Platelets: 196 10*3/uL (ref 150–400)
Platelets: 278 10*3/uL (ref 150–400)
RBC: 5.18 MIL/uL — ABNORMAL HIGH (ref 3.87–5.11)
RBC: 5.21 MIL/uL — ABNORMAL HIGH (ref 3.87–5.11)
RDW: 12.3 % (ref 11.5–15.5)
RDW: 12.3 % (ref 11.5–15.5)
WBC: 4.6 10*3/uL (ref 4.0–10.5)
WBC: 7.4 10*3/uL (ref 4.0–10.5)
nRBC: 0 % (ref 0.0–0.2)
nRBC: 0 % (ref 0.0–0.2)

## 2020-01-04 LAB — CBG MONITORING, ED: Glucose-Capillary: 117 mg/dL — ABNORMAL HIGH (ref 70–99)

## 2020-01-04 LAB — ETHANOL
Alcohol, Ethyl (B): <10 mg/dL (ref <10)
Alcohol, Ethyl (B): <10 mg/dL (ref <10)

## 2020-01-04 LAB — URINALYSIS, ROUTINE W REFLEX MICROSCOPIC
Bacteria, UA: NONE SEEN
Bilirubin Urine: NEGATIVE
Glucose, UA: NEGATIVE mg/dL
Hgb urine dipstick: NEGATIVE
Ketones, ur: 20 mg/dL — AB
Nitrite: NEGATIVE
Protein, ur: NEGATIVE mg/dL
Specific Gravity, Urine: 1.015 (ref 1.005–1.030)
pH: 7 (ref 5.0–8.0)

## 2020-01-04 LAB — ACETAMINOPHEN LEVEL
Acetaminophen (Tylenol), Serum: <10 ug/mL — ABNORMAL LOW (ref 10–30)
Acetaminophen (Tylenol), Serum: <10 ug/mL — ABNORMAL LOW (ref 10–30)

## 2020-01-04 LAB — RAPID URINE DRUG SCREEN, HOSP PERFORMED
Amphetamines: NOT DETECTED
Barbiturates: NOT DETECTED
Benzodiazepines: POSITIVE — AB
Cocaine: NOT DETECTED
Opiates: NOT DETECTED
Tetrahydrocannabinol: POSITIVE — AB

## 2020-01-04 LAB — CK: Total CK: 324 U/L — ABNORMAL HIGH (ref 38–234)

## 2020-01-04 LAB — SALICYLATE LEVEL
Salicylate Lvl: <7 mg/dL — ABNORMAL LOW (ref 7.0–30.0)
Salicylate Lvl: <7 mg/dL — ABNORMAL LOW (ref 7.0–30.0)

## 2020-01-04 LAB — PREGNANCY, URINE: Preg Test, Ur: NEGATIVE

## 2020-01-04 MED ORDER — LORAZEPAM 1 MG PO TABS
2.0000 mg | ORAL_TABLET | Freq: Once | ORAL | Status: AC
  Administered 2020-01-04: 2 mg via ORAL
  Filled 2020-01-04: qty 2

## 2020-01-04 MED ORDER — LACTATED RINGERS IV BOLUS
1000.0000 mL | Freq: Once | INTRAVENOUS | Status: AC
  Administered 2020-01-04: 1000 mL via INTRAVENOUS

## 2020-01-04 MED ORDER — LORAZEPAM 2 MG/ML IJ SOLN
2.0000 mg | Freq: Once | INTRAMUSCULAR | Status: AC
  Administered 2020-01-04: 2 mg via INTRAMUSCULAR
  Filled 2020-01-04: qty 1

## 2020-01-04 NOTE — ED Provider Notes (Signed)
[facility] EMERGENCY DEPARTMENT Provider Note   CSN: [id number] Arrival date & time: 01/04/20  1600     History Chief Complaint  Patient presents with  . Medical Clearance    Hallie Ertl is a 34 y.o. female.  HPI  34yF brought in by police. They were called to local business for patient laying down in parking lot. Pt was acting bizarre. Did tell police at one point that she put methamphetamines in her pants. Police searched her but did not locate any. Pt keeps reaching for vagina and they suspect she may have place it intravaginally. Pt was released from ED earlier today after suspected drug use. Discharged when cooperative and able to relate that she didn't want to harm herself or others.   Past Medical History:  Diagnosis Date  . Allergy   . Bronchitis   . Hepatitis-C   . History of gestational diabetes   . HPV in female   . Substance abuse [facility])     Patient Active Problem List   Diagnosis Date Noted  . Acute cholecystitis   . Anemia   . Abdominal pain 01/24/2017  . Hyperglycemia 01/24/2017  . Fever 01/24/2017    Past Surgical History:  Procedure Laterality Date  . HEMORRHOID BANDING  age 48     OB History    Gravida      Para      Term      Preterm      AB      Living  2     SAB      TAB      Ectopic      Multiple      Live Births              Family History  Problem Relation Age of Onset  . Depression Mother   . Cervical cancer Mother   . Hypertension Father   . Diabetes Father     Social History   Tobacco Use  . Smoking status: Current Every Day Smoker    Years: 24.00    Types: Cigarettes, Cigars  . Smokeless tobacco: Never Used  . Tobacco comment: 1 cigar/daily. former 1 pack cigarette smoker quit 05/2019  currently smokes CBD  Vaping Use  . Vaping Use: Every day  . Substances: Nicotine, Flavoring  Substance Use Topics  . Alcohol use: No  . Drug use: Not Currently    Types: IV, Cocaine, Hydrocodone, Oxycodone,  Marijuana, Heroin    Comment: takes methadone- now taking subutex (03/27/19)    Home Medications Prior to Admission medications   Medication Sig Start Date End Date Taking? Authorizing Provider  cetirizine (ZYRTEC) 10 MG tablet Take 10 mg by mouth daily.    [provider]  hydrOXYzine (ATARAX/VISTARIL) 25 MG tablet Take 1-2 tablets (25-50 mg total) by mouth every 6 (six) hours as needed for anxiety. 12/31/19   Gilda Crease, MD  ibuprofen (ADVIL) 200 MG tablet Take 200 mg by mouth every 6 (six) hours as needed.    [provider]  METHADONE HCL PO Take 60 mg by mouth daily.     [provider]  Polyethylene Glycol 3350 (MIRALAX PO) Take by mouth.    [provider]    Allergies    Patient has no known allergies.  Review of Systems   Review of Systems All systems reviewed and negative, other than as noted in HPI.  Physical Exam Updated Vital Signs BP 121/84   Temp (!) (  P) 100.8 F (38.2 C) (Oral)   LMP 12/12/2019   Physical Exam Vitals and nursing note reviewed.  Constitutional:      General: She is not in acute distress.    Appearance: She is well-developed.  HENT:     Head: Normocephalic and atraumatic.  Eyes:     General:        Right eye: No discharge.        Left eye: No discharge.     Conjunctiva/sclera: Conjunctivae normal.  Cardiovascular:     Rate and Rhythm: Normal rate and regular rhythm.     Heart sounds: Normal heart sounds. No murmur heard.  No friction rub. No gallop.   Pulmonary:     Effort: Pulmonary effort is normal. No respiratory distress.     Breath sounds: Normal breath sounds.  Abdominal:     General: There is no distension.     Palpations: Abdomen is soft.     Tenderness: There is no abdominal tenderness.  Genitourinary:    Comments: Two female nurses and female police officer present. Speculum exam done. No FB noted. Digital exam down and swept around cervix w/o palpating anything concerns. Digital  rectal exam w/o any FB noted.  Musculoskeletal:        General: No tenderness.     Cervical back: Neck supple.  Skin:    General: Skin is warm and dry.  Neurological:     Mental Status: She is alert.  Psychiatric:        Behavior: Behavior normal.        Thought Content: Thought content normal.     ED Results / Procedures / Treatments   Labs (all labs ordered are listed, but only abnormal results are displayed) Labs Reviewed  COMPREHENSIVE METABOLIC PANEL - Abnormal; Notable for the following components:      Result Value   Glucose, Bld 139 (*)    BUN 27 (*)    Creatinine, Ser 1.52 (*)    Total Protein 9.3 (*)    AST 129 (*)    ALT 121 (*)    GFR calc non Af Amer 44 (*)    GFR calc Af Amer 51 (*)    All other components within normal limits  CBC WITH DIFFERENTIAL/PLATELET - Abnormal; Notable for the following components:   RBC 5.21 (*)    Hemoglobin 16.0 (*)    HCT 47.7 (*)    All other components within normal limits  CK - Abnormal; Notable for the following components:   Total CK 324 (*)    All other components within normal limits  SALICYLATE LEVEL - Abnormal; Notable for the following components:   Salicylate Lvl <7.0 (*)    All other components within normal limits  ACETAMINOPHEN LEVEL - Abnormal; Notable for the following components:   Acetaminophen (Tylenol), Serum <10 (*)    All other components within normal limits  ETHANOL  URINALYSIS, ROUTINE W REFLEX MICROSCOPIC  RAPID URINE DRUG SCREEN, HOSP PERFORMED    EKG EKG Interpretation  Date/Time:  Monday January 04 2020 16:57:29 EDT Ventricular Rate:  93 PR Interval:    QRS Duration: 80 QT Interval:  371 QTC Calculation: 462 R Axis:   109 Text Interpretation: Sinus rhythm Right atrial enlargement Borderline right axis deviation Nonspecific repol abnormality, inferior leads Confirmed by Raeford Razor [phone]) on 01/04/2020 6:02:25 PM   Radiology No results found.  Procedures Procedures (including  critical care time)  CRITICAL CARE Performed by: Raeford Razor Total critical  care time: 35 minutes Critical care time was exclusive of separately billable procedures and treating other patients. Critical care was necessary to treat or prevent imminent or life-threatening deterioration. Critical care was time spent personally by me on the following activities: development of treatment plan with patient and/or surrogate as well as nursing, discussions with consultants, evaluation of patient's response to treatment, examination of patient, obtaining history from patient or surrogate, ordering and performing treatments and interventions, ordering and review of laboratory studies, ordering and review of radiographic studies, pulse oximetry and re-evaluation of patient's condition.  Medications Ordered in ED Medications  LORazepam (ATIVAN) injection 2 mg (has no administration in time range)    ED Course  I have reviewed the triage vital signs and the nursing notes.  Pertinent labs & imaging results that were available during my care of the patient were reviewed by me and considered in my medical decision making (see chart for details).    MDM Rules/Calculators/A&P                          34yF with likely acute drug ingestion.  Suspect acute amphetamine intoxication. Very agitated/encephalopathic. Tachycardic. Dilated pupils. Febrile. Mouth dry. No vaginal or rectal FB noted.   Benzos for sedation. Needs close monitoring. Screen for co-ingestants and metabolic derangement.   10:43 PM Appears to be sleeping in bed. When aroused she seems fearful and anxious. Will make eye contact but won't answer questions. Will continue to monitor.   Final Clinical Impression(s) / ED Diagnoses Final diagnoses:  Amphetamine and psychostimulant intoxication without complication [facility])    Rx / DC Orders ED Discharge Orders    None       Raeford Razor, MD 01/06/20 1002

## 2020-01-04 NOTE — ED Provider Notes (Signed)
[facility] EMERGENCY DEPARTMENT Provider Note   CSN: [id number] Arrival date & time: 01/04/20  1191   Time seen 3:20 AM  History Chief Complaint  Patient presents with  . Altered Mental Status    Anita Davis is a 34 y.o. female.  HPI   Patient was brought to the emergency department by a family member.  Patient refuses to answer any questions.  She was not wearing a mask and it was laying on her body.  I told her she needs to put on the mask and she slowly did.  However when I ask her question she just stares at me.  This is her third ED visit since  September 10.  The prior 2 visits were for anxiety.  She has a history of being on methadone and having polysubstance abuse.  When I look at her legs she has lesions consistent with methamphetamine use.  When I asked her if she has been doing meth she just stares at me.  PCP [name], PA-C     Past Medical History:  Diagnosis Date  . Allergy   . Bronchitis   . Hepatitis-C   . History of gestational diabetes   . HPV in female   . Substance abuse [facility])     Patient Active Problem List   Diagnosis Date Noted  . Acute cholecystitis   . Anemia   . Abdominal pain 01/24/2017  . Hyperglycemia 01/24/2017  . Fever 01/24/2017    Past Surgical History:  Procedure Laterality Date  . HEMORRHOID BANDING  age 55     OB History    Gravida      Para      Term      Preterm      AB      Living  2     SAB      TAB      Ectopic      Multiple      Live Births              Family History  Problem Relation Age of Onset  . Depression Mother   . Cervical cancer Mother   . Hypertension Father   . Diabetes Father     Social History   Tobacco Use  . Smoking status: Current Every Day Smoker    Years: 24.00    Types: Cigarettes, Cigars  . Smokeless tobacco: Never Used  . Tobacco comment: 1 cigar/daily. former 1 pack cigarette smoker quit 05/2019  currently smokes CBD  Vaping Use  . Vaping Use:  Every day  . Substances: Nicotine, Flavoring  Substance Use Topics  . Alcohol use: No  . Drug use: Not Currently    Types: IV, Cocaine, Hydrocodone, Oxycodone, Marijuana, Heroin    Comment: takes methadone- now taking subutex (03/27/19)    Home Medications Prior to Admission medications   Medication Sig Start Date End Date Taking? Authorizing Provider  cetirizine (ZYRTEC) 10 MG tablet Take 10 mg by mouth daily.    [provider]  hydrOXYzine (ATARAX/VISTARIL) 25 MG tablet Take 1-2 tablets (25-50 mg total) by mouth every 6 (six) hours as needed for anxiety. 12/31/19   Gilda Crease, MD  ibuprofen (ADVIL) 200 MG tablet Take 200 mg by mouth every 6 (six) hours as needed.    [provider]  METHADONE HCL PO Take 60 mg by mouth daily.     [provider]  Polyethylene Glycol 3350 (MIRALAX PO) Take by mouth.  [provider]    Allergies    Patient has no known allergies.  Review of Systems   Review of Systems  Unable to perform ROS: Patient nonverbal    Physical Exam Updated Vital Signs BP (!) 133/100 (BP Location: Right Arm)   Pulse 95   Temp 99.6 F (37.6 C) (Oral)   Resp 19   LMP 12/12/2019   SpO2 100%   Physical Exam Vitals and nursing note reviewed.  Constitutional:      Appearance: Normal appearance. She is normal weight.  HENT:     Head: Normocephalic and atraumatic.  Eyes:     Extraocular Movements: Extraocular movements intact.     Conjunctiva/sclera: Conjunctivae normal.     Pupils: Pupils are equal, round, and reactive to light.  Cardiovascular:     Rate and Rhythm: Normal rate and regular rhythm.  Pulmonary:     Effort: Pulmonary effort is normal. No respiratory distress.     Breath sounds: Normal breath sounds.  Musculoskeletal:     Cervical back: Normal range of motion.  Skin:    General: Skin is warm and dry.     Comments: Patient has some bruising and needle marks on her forearms.  When I look at her legs  she has several small round scabbed areas that look like lesions of skin picking from methamphetamine use.  None of them appear to be infected.  Neurological:     Mental Status: She is alert.     Comments: Patient just stares, she is not cooperative for neurological exam or psychiatric exam.  Psychiatric:        Speech: She is noncommunicative.        Behavior: Behavior is slowed.     ED Results / Procedures / Treatments   Labs (all labs ordered are listed, but only abnormal results are displayed) Results for orders placed or performed during the hospital encounter of 01/04/20  Comprehensive metabolic panel  Result Value Ref Range   Sodium 140 135 - 145 mmol/L   Potassium 3.8 3.5 - 5.1 mmol/L   Chloride 102 98 - 111 mmol/L   CO2 23 22 - 32 mmol/L   Glucose, Bld 102 (H) 70 - 99 mg/dL   BUN 17 6 - 20 mg/dL   Creatinine, Ser 7.51 0.44 - 1.00 mg/dL   Calcium 9.9 8.9 - 02.5 mg/dL   Total Protein 9.2 (H) 6.5 - 8.1 g/dL   Albumin 5.0 3.5 - 5.0 g/dL   AST 852 (H) 15 - 41 U/L   ALT 121 (H) 0 - 44 U/L   Alkaline Phosphatase 63 38 - 126 U/L   Total Bilirubin 1.0 0.3 - 1.2 mg/dL   GFR calc non Af Amer >60 >60 mL/min   GFR calc Af Amer >60 >60 mL/min   Anion gap 15 5 - 15  Ethanol  Result Value Ref Range   Alcohol, Ethyl (B) <10 <10 mg/dL  CBC with Differential  Result Value Ref Range   WBC 4.6 4.0 - 10.5 K/uL   RBC 5.18 (H) 3.87 - 5.11 MIL/uL   Hemoglobin 16.0 (H) 12.0 - 15.0 g/dL   HCT 77.8 (H) 36 - 46 %   MCV 94.8 80.0 - 100.0 fL   MCH 30.9 26.0 - 34.0 pg   MCHC 32.6 30.0 - 36.0 g/dL   RDW 24.2 35.3 - 61.4 %   Platelets 196 150 - 400 K/uL   nRBC 0.0 0.0 - 0.2 %   Neutrophils Relative %  55 %   Neutro Abs 2.5 1.7 - 7.7 K/uL   Lymphocytes Relative 36 %   Lymphs Abs 1.7 0.7 - 4.0 K/uL   Monocytes Relative 9 %   Monocytes Absolute 0.4 0 - 1 K/uL   Eosinophils Relative 0 %   Eosinophils Absolute 0.0 0 - 0 K/uL   Basophils Relative 0 %   Basophils Absolute 0.0 0 - 0 K/uL    Immature Granulocytes 0 %   Abs Immature Granulocytes 0.01 0.00 - 0.07 K/uL  Salicylate level  Result Value Ref Range   Salicylate Lvl <7.0 (L) 7.0 - 30.0 mg/dL  Acetaminophen level  Result Value Ref Range   Acetaminophen (Tylenol), Serum <10 (L) 10 - 30 ug/mL  CBG monitoring, ED  Result Value Ref Range   Glucose-Capillary 117 (H) 70 - 99 mg/dL   Laboratory interpretation all normal except elevation of LFTs but patient has hepatitis C, concentrated hemoglobin consistent with dehydration     EKG None  Radiology No results found.  Procedures Procedures (including critical care time)  Medications Ordered in ED Medications  LORazepam (ATIVAN) tablet 2 mg (2 mg Oral Given 01/04/20 0715)    ED Course  I have reviewed the triage vital signs and the nursing notes.  Pertinent labs & imaging results that were available during my care of the patient were reviewed by me and considered in my medical decision making (see chart for details).    MDM Rules/Calculators/A&P                          Laboratory testing was ordered.  Nurse reports around 630 that patient appeared to be hallucinating and was talking something about the kitchen and knives.  I talked to the patient at 7:00 AM and she told me she knew where she was but she could not tell me where we were.  She would not answer the year or her birthdate.  She started just staring and not responding to me.  7:00 AM patient seems to be getting more agitated.  She is getting off her stretcher.  She was given Ativan 2 mg orally.  7:30 AM IVC papers were filled out by myself, patient appears to be hallucinating and I am afraid she is going to try to leave the ED.  She appears to be under the influence of something.  TTS consult was ordered however I do not think she is will be able to participate.  Dr. Estell Harpin was aware of the patient at change of shift.  Final Clinical Impression(s) / ED Diagnoses Final diagnoses:  Disorientation     Rx / DC Orders  Disposition pending  Devoria Albe, MD, Concha Pyo, MD 01/04/20 [id number]

## 2020-01-04 NOTE — ED Triage Notes (Signed)
Pt brought to ED by [place] [employer]. [employer] got a call, pt was laying out in the parking lot, pt not answering questions. Pt does state she has meth in her pants. [employer] in triage.

## 2020-01-04 NOTE — ED Notes (Signed)
Pt grabbed nurse by her arm tightly and said " I am dead". Pt let go of nurse and didn't say anything else. Nurse continued to ask if she had injested drugs with no response.

## 2020-01-04 NOTE — Discharge Instructions (Addendum)
Follow up with [facility] this week.   Stop using drugs or you will die soon

## 2020-01-04 NOTE — ED Triage Notes (Addendum)
Pt has been seen here twice in the last week for anxiety. Pt's here with a friend that she stays with, he states she is delusional. He states that she was saying "I'm dead." he states she is taking methadone.

## 2020-01-04 NOTE — ED Notes (Signed)
Pt unhooked herself and jumped up into the hallway confused and dazed, verbal order for non violent restraints for bilateral wrists. During placement of restraints pt verbalized she swallowed drugs. Refused reveal how much and exactly what kind.

## 2020-01-04 NOTE — ED Provider Notes (Signed)
Patient awoke after abusing substances.  She states that she wants to be discharged home she does not want to hurt herself or anybody else.  She was referred to Richardson Landry, MD 01/04/20 [phone]

## 2020-01-04 NOTE — ED Notes (Signed)
Patient denies pain and is resting comfortably. Will not answer questions at this time.

## 2020-01-04 NOTE — ED Notes (Signed)
Refusing to void/give urine sample, refusing cath for UA.

## 2020-01-04 NOTE — ED Notes (Signed)
Nsg Discharge Note  Admit Date:  01/04/2020 Discharge date: 01/04/2020   Anita Davis to be D/C'd home per MD order.  AVS completed.  Copy for chart, and copy for patient signed, and dated. Patient/caregiver able to verbalize understanding.  Discharge Medication:   Discharge Assessment: Vitals:   01/04/20 0243  BP: (!) 133/100  Pulse: 95  Resp: 19  Temp: 99.6 F (37.6 C)  SpO2: 100%   Skin clean, dry and intact without evidence of skin break down, no evidence of skin tears noted. IV catheter discontinued intact. Site without signs and symptoms of complications - no redness or edema noted at insertion site, patient denies c/o pain - only slight tenderness at site.  Dressing with slight pressure applied.  D/c Instructions-Education: Discharge instructions given to patient/family with verbalized understanding. D/c education completed with patient/family including follow up instructions, medication list, d/c activities limitations if indicated, with other d/c instructions as indicated by MD - patient able to verbalize understanding, all questions fully answered. Patient instructed to return to ED, call 911, or call MD for any changes in condition.  Patient escorted via WC, and D/C home via private auto.  Rocco Pauls, RN 01/04/2020 10:33 AM

## 2020-01-04 NOTE — ED Notes (Signed)
Asked pt for urine specimen, pt didn't respond but only stared off into space.

## 2020-01-05 ENCOUNTER — Emergency Department (HOSPITAL_COMMUNITY): Payer: Medicaid Other

## 2020-01-05 DIAGNOSIS — R748 Abnormal levels of other serum enzymes: Secondary | ICD-10-CM

## 2020-01-05 DIAGNOSIS — E876 Hypokalemia: Secondary | ICD-10-CM

## 2020-01-05 DIAGNOSIS — G9341 Metabolic encephalopathy: Secondary | ICD-10-CM | POA: Diagnosis present

## 2020-01-05 DIAGNOSIS — R7401 Elevation of levels of liver transaminase levels: Secondary | ICD-10-CM

## 2020-01-05 DIAGNOSIS — F1910 Other psychoactive substance abuse, uncomplicated: Secondary | ICD-10-CM

## 2020-01-05 LAB — URINALYSIS, ROUTINE W REFLEX MICROSCOPIC
Bacteria, UA: NONE SEEN
Bilirubin Urine: NEGATIVE
Glucose, UA: NEGATIVE mg/dL
Hgb urine dipstick: NEGATIVE
Ketones, ur: 5 mg/dL — AB
Leukocytes,Ua: NEGATIVE
Nitrite: NEGATIVE
Protein, ur: 30 mg/dL — AB
Specific Gravity, Urine: 1.025 (ref 1.005–1.030)
pH: 5 (ref 5.0–8.0)

## 2020-01-05 LAB — GRAM STAIN

## 2020-01-05 LAB — RAPID URINE DRUG SCREEN, HOSP PERFORMED
Amphetamines: NOT DETECTED
Barbiturates: NOT DETECTED
Benzodiazepines: POSITIVE — AB
Cocaine: NOT DETECTED
Opiates: NOT DETECTED
Tetrahydrocannabinol: POSITIVE — AB

## 2020-01-05 LAB — CSF CELL COUNT WITH DIFFERENTIAL
RBC Count, CSF: 0 /mm3
Tube #: 4
WBC, CSF: 1 /mm3 (ref 0–5)

## 2020-01-05 LAB — BASIC METABOLIC PANEL
Anion gap: 12 (ref 5–15)
BUN: 31 mg/dL — ABNORMAL HIGH (ref 6–20)
CO2: 25 mmol/L (ref 22–32)
Calcium: 9.2 mg/dL (ref 8.9–10.3)
Chloride: 105 mmol/L (ref 98–111)
Creatinine, Ser: 0.98 mg/dL (ref 0.44–1.00)
GFR calc Af Amer: >60 mL/min (ref >60)
GFR calc non Af Amer: >60 mL/min (ref >60)
Glucose, Bld: 105 mg/dL — ABNORMAL HIGH (ref 70–99)
Potassium: 3.3 mmol/L — ABNORMAL LOW (ref 3.5–5.1)
Sodium: 142 mmol/L (ref 135–145)

## 2020-01-05 LAB — PROTEIN AND GLUCOSE, CSF
Glucose, CSF: 53 mg/dL (ref 40–70)
Total  Protein, CSF: 19 mg/dL (ref 15–45)

## 2020-01-05 LAB — SARS CORONAVIRUS 2 BY RT PCR (HOSPITAL ORDER, PERFORMED IN ~~LOC~~ HOSPITAL LAB): SARS Coronavirus 2: NEGATIVE

## 2020-01-05 MED ORDER — SODIUM CHLORIDE 0.9 % IV BOLUS
1000.0000 mL | Freq: Once | INTRAVENOUS | Status: AC
  Administered 2020-01-05: 1000 mL via INTRAVENOUS

## 2020-01-05 MED ORDER — LORAZEPAM 1 MG PO TABS
1.0000 mg | ORAL_TABLET | Freq: Once | ORAL | Status: AC
  Administered 2020-01-05: 1 mg via ORAL
  Filled 2020-01-05: qty 1

## 2020-01-05 MED ORDER — ENOXAPARIN SODIUM 40 MG/0.4ML ~~LOC~~ SOLN
40.0000 mg | SUBCUTANEOUS | Status: DC
  Administered 2020-01-05 – 2020-01-26 (×22): 40 mg via SUBCUTANEOUS
  Filled 2020-01-05 (×22): qty 0.4

## 2020-01-05 MED ORDER — LORAZEPAM 2 MG/ML IJ SOLN
1.0000 mg | Freq: Once | INTRAMUSCULAR | Status: AC
  Administered 2020-01-05: 1 mg via INTRAVENOUS
  Filled 2020-01-05: qty 1

## 2020-01-05 MED ORDER — SODIUM CHLORIDE 0.9 % IV SOLN
2.0000 g | Freq: Once | INTRAVENOUS | Status: AC
  Administered 2020-01-05: 2 g via INTRAVENOUS
  Filled 2020-01-05: qty 20

## 2020-01-05 MED ORDER — LORAZEPAM 2 MG/ML IJ SOLN
INTRAMUSCULAR | Status: AC
  Filled 2020-01-05: qty 1

## 2020-01-05 MED ORDER — LORAZEPAM 2 MG/ML IJ SOLN
1.0000 mg | Freq: Once | INTRAMUSCULAR | Status: AC
  Administered 2020-01-05: 1 mg via INTRAVENOUS

## 2020-01-05 MED ORDER — VANCOMYCIN HCL IN DEXTROSE 1-5 GM/200ML-% IV SOLN
1000.0000 mg | Freq: Two times a day (BID) | INTRAVENOUS | Status: DC
  Administered 2020-01-06 (×2): 1000 mg via INTRAVENOUS
  Filled 2020-01-05 (×2): qty 200

## 2020-01-05 MED ORDER — VANCOMYCIN HCL 1500 MG/300ML IV SOLN
1500.0000 mg | Freq: Once | INTRAVENOUS | Status: AC
  Administered 2020-01-05: 1500 mg via INTRAVENOUS
  Filled 2020-01-05: qty 300

## 2020-01-05 NOTE — ED Notes (Signed)
I have been in patients room multiple times over several hours, each time she is awake, eyes open staring straight ahead. I have asked pt orientation questions, she does not answer, only stares ahead.  She clenches fist and pulls on wrist restraints. I have discussed with her if she is cooperative we can remove restraints, pt does not acknowledge. Sitter remains at the bedside.

## 2020-01-05 NOTE — ED Notes (Signed)
EDP at bedside  

## 2020-01-05 NOTE — ED Notes (Signed)
ED Provider at bedside, continue restraints at this time per EDP

## 2020-01-05 NOTE — ED Provider Notes (Signed)
Signout note  34 year old lady presented to ER with concern for altered mental status.  Similar presentation recently and thought to be secondary to substance abuse, methamphetamine abuse.  On her last ED visit, her mental status improved after period of observation and she was discharged home.  Patient represented with confusion, altered mental status.  Patient is virtually nonverbal, but awake.  Dr. Estell Harpin checked labs, MRI, UA, UDS pending.  MRI only obtained axial diffusion imaging, motion degraded study.  Labs were grossly negative.  3:30 PM Received signout from Dr. Estell Harpin, reassessed patient, follow-up UA, UDS results.  Disposition pending results, reassessment  5:00 PM reassessed patient, no improvement, UA/UDS in process  6:00 PM discussed with Amada Jupiter, given AMS without clear cause, fever, recommends empiric abx and lumbar puncture, recommends admit AP, have AP neuro see in am, EEG tomorrow; placed orders for vanc and rocephin  7:40 PM attempted to contact emergency contact, no answer from aunt who listed as emergency contact, patient unable to provide consent, implied consent due to urgent nature of condition and need for procedure.  successful LP, see separate procedure note  9:00 PM d/w hospitalist who will admit   .Lumbar Puncture  Date/Time: 01/05/2020 9:02 PM Performed by: Milagros Loll, MD Authorized by: Milagros Loll, MD   Consent:    Consent obtained:  Emergent situation Universal protocol:    Immediately prior to procedure a time out was called: yes     Site/side marked: yes     Patient identity confirmed:  Arm band Pre-procedure details:    Procedure purpose:  Diagnostic Sedation:    Sedation type:  Anxiolysis Anesthesia (see MAR for exact dosages):    Anesthesia method:  Local infiltration   Local anesthetic:  Lidocaine 1% w/o epi Procedure details:    Lumbar space:  L3-L4 interspace   Patient position:  Sitting   Needle gauge:  22   Needle  type:  Spinal needle - Quincke tip   Ultrasound guidance: no     Number of attempts:  2   Fluid appearance:  Clear   Tubes of fluid:  4   Total volume (ml):  5 Post-procedure:    Patient tolerance of procedure:  Tolerated well, no immediate complications Comments:     First attempt at L4-L5 interspace unsuccessful, second attempt at L3-L4 was successful. Clear fluid. Patient tolerated well.  .Critical Care Performed by: Milagros Loll, MD Authorized by: Milagros Loll, MD   Critical care provider statement:    Critical care time (minutes):  35   Critical care was necessary to treat or prevent imminent or life-threatening deterioration of the following conditions:  CNS failure or compromise   Critical care was time spent personally by me on the following activities:  Blood draw for specimens, development of treatment plan with patient or surrogate, discussions with consultants, discussions with primary provider, pulse oximetry, re-evaluation of patient's condition, ordering and performing treatments and interventions, ordering and review of laboratory studies and ordering and review of radiographic studies   I assumed direction of critical care for this patient from another provider in my specialty: yes        Milagros Loll, MD 01/05/20 2105

## 2020-01-05 NOTE — ED Notes (Signed)
In and out cath pt. To get urine sample. Drained 350 mL from bladder.

## 2020-01-05 NOTE — ED Provider Notes (Signed)
While in the emergency department the patient continued to not speak with anyone else while she was in expletives when she was getting MRI done.  She will follow commands.  Patient was dehydrated and given couple liters of fluid.  Patient will be catheter urinalysis and drug screen and final disposition will be made by Dr. Stevie Kern after reevaluation   Bethann Berkshire, MD 01/06/20 [phone]

## 2020-01-05 NOTE — ED Notes (Signed)
Attempted in and out nothing come, will try after fluids

## 2020-01-05 NOTE — ED Notes (Signed)
Bladder scanned pt. Pts. Has 500 ml of urine present.

## 2020-01-05 NOTE — H&P (Signed)
History and Physical  Anita Davis FBP:[id number] DOB: October 16, 1985 DOA: 01/04/2020  Referring physician: Marianna Fuss, MD PCP: [name], PA-C  Patient coming from: Home   Chief Complaint: Altered mental status  HPI: Anita Davis is a 34 y.o. female with medical history significant for hepatitis C and polysubstance abuse who presents to the emergency department via EMS due to altered mental status. Patient was unable to provide history at this time, she was alert, but she was not responding to questions at bedside.  History was obtained from ED physician and ED medical record.  She was initially seen in the ED yesterday (9/13) around 3:20 AM when she was brought in by family member due to disorientation which was suspected to be due to substance abuse (methamphetamine), she was eventually discharged later in the morning when she was more alert.  Later in the day, local police was called to a local business due to finding patient lying down in the parking lot of a local business, patient states that she had methamphetamine in her pants, however, this was not found when she was searchred.  She was then taken to the ED for further evaluation and management.  ED physician consulted with neurologist at [facility] who recommends empiric antibiotic, LP, EEG and to admit patient to EP with plan for neurologist to see patient in the morning.  Patient was in a two-point restraint due to trying to get out of bed.  She maintained eye contact, but was nonverbal.  ED Course:  In the emergency department, she was hemodynamically stable.  Work-up in the ED showed normal CBC, hypokalemia urinalysis was negative.  UTI, urine drug screen was positive for benzodiazepine (received Ativan in the ED) and THC, CK 324, AST 129, ALT 121, SARS coronavirus 2 was negative.  MRI brain without contrast showed no abnormality.  Lumbar puncture was done and CSF Gram stain is pending.  IV Ativan was given and patient was  empirically started on IV ceftriaxone and IV vancomycin.  Hospitalist was asked to admit patient for further evaluation and management.  Review of Systems: This cannot be obtained at this time due to patient being nonverbal  Past Medical History:  Diagnosis Date  . Allergy   . Bronchitis   . Hepatitis-C   . History of gestational diabetes   . HPV in female   . Substance abuse [facility])    Past Surgical History:  Procedure Laterality Date  . HEMORRHOID BANDING  age 67    Social History:  reports that she has been smoking cigarettes and cigars. She has smoked for the past 24.00 years. She has never used smokeless tobacco. She reports previous drug use. Drugs: IV, Cocaine, Hydrocodone, Oxycodone, Marijuana, and Heroin. She reports that she does not drink alcohol.   No Known Allergies  Family History  Problem Relation Age of Onset  . Depression Mother   . Cervical cancer Mother   . Hypertension Father   . Diabetes Father    Prior to Admission medications   Medication Sig Start Date End Date Taking? Authorizing Provider  cephALEXin (KEFLEX) 500 MG capsule Take 500 mg by mouth 2 (two) times daily. For 5 days. 12/24/19   [provider]  cetirizine (ZYRTEC) 10 MG tablet Take 10 mg by mouth daily.    [provider]  hydrOXYzine (ATARAX/VISTARIL) 25 MG tablet Take 1-2 tablets (25-50 mg total) by mouth every 6 (six) hours as needed for anxiety. Patient taking differently: Take 75 mg by mouth 5 (five)  times daily.  12/31/19   Gilda Crease, MD  ibuprofen (ADVIL) 200 MG tablet Take 200 mg by mouth every 6 (six) hours as needed.    [provider]  METHADONE HCL PO Take 60 mg by mouth daily.     [provider]  ondansetron (ZOFRAN-ODT) 4 MG disintegrating tablet Take 4 mg by mouth 3 (three) times daily as needed for nausea or vomiting. Starting 12/24/2019 x 4 days. 12/24/19   [provider]  Polyethylene Glycol 3350 (MIRALAX PO) Take by mouth.     [provider]    Physical Exam: BP (!) 130/96   Pulse 92   Temp 100.1 F (37.8 C) (Oral)   Resp 14   LMP 12/12/2019   SpO2 99%   . General: 34 y.o. year-old female well developed and in no acute distress.  Alert, maintains eye contact but was nonverbal.  She was in two-point restraint due to attempt to get out of bed. Marland Kitchen HEENT: Normocephalic, atraumatic . Neck: Supple, trachea midline . Cardiovascular: Regular rate and rhythm with no rubs or gallops.  No thyromegaly or JVD noted.  No lower extremity edema. 2/4 pulses in all 4 extremities. Marland Kitchen Respiratory: Clear to auscultation with no wheezes or rales. Good inspiratory effort. . Abdomen: Soft nontender nondistended with normal bowel sounds x4 quadrants. . Muskuloskeletal: No cyanosis, clubbing or edema noted bilaterally . Neuro: CN II-XII intact, strength, sensation, reflexes . Skin: No ulcerative lesions noted or rashes . Psychiatry: Judgement and insight appear normal. Mood is appropriate for condition and setting          Labs on Admission:  Basic Metabolic Panel: Recent Labs  Lab 12/31/19 0236 01/02/20 0304 01/04/20 0546 01/04/20 1651 01/05/20 1122  NA 134* 136 140 143 142  K 3.4* 3.6 3.8 3.5 3.3*  CL 100 99 102 105 105  CO2 23 25 23 24 25   GLUCOSE 180* 133* 102* 139* 105*  BUN 8 7 17  27* 31*  CREATININE 0.79 0.76 0.82 1.52* 0.98  CALCIUM 9.0 9.4 9.9 10.3 9.2   Liver Function Tests: Recent Labs  Lab 01/04/20 0546 01/04/20 1651  AST 130* 129*  ALT 121* 121*  ALKPHOS 63 60  BILITOT 1.0 1.1  PROT 9.2* 9.3*  ALBUMIN 5.0 4.8   No results for input(s): LIPASE, AMYLASE in the last 168 hours. No results for input(s): AMMONIA in the last 168 hours. CBC: Recent Labs  Lab 12/31/19 0236 01/04/20 0546 01/04/20 1651  WBC 5.1 4.6 7.4  NEUTROABS  --  2.5 4.8  HGB 13.3 16.0* 16.0*  HCT 40.2 49.1* 47.7*  MCV 93.3 94.8 91.6  PLT 213 196 278   Cardiac Enzymes: Recent Labs  Lab 01/04/20 1651  CKTOTAL  324*    BNP (last 3 results) No results for input(s): BNP in the last 8760 hours.  ProBNP (last 3 results) No results for input(s): PROBNP in the last 8760 hours.  CBG: Recent Labs  Lab 12/31/19 0140 01/04/20 0337  GLUCAP 228* 117*    Radiological Exams on Admission: MR BRAIN WO CONTRAST  Result Date: 01/05/2020 CLINICAL DATA:  Combative patient.  Mental status changes. EXAM: MRI HEAD WITHOUT CONTRAST TECHNIQUE: Multiplanar, multiecho pulse sequences of the brain and surrounding structures were obtained without intravenous contrast. COMPARISON:  None. FINDINGS: Brain: Motion degraded study. Axial diffusion imaging only obtained. No sign of acute infarction or other restricted diffusion lesion. No sign of hydrocephalus, mass, hemorrhage or extra-axial collection. Vascular: Major vessels at the base  of the brain show flow. Skull and upper cervical spine: No abnormality seen. Sinuses/Orbits: No abnormality seen. Other: None IMPRESSION: Motion degraded study. Axial diffusion imaging only obtained. No abnormality seen to explain the clinical presentation. Electronically Signed   By: Paulina Fusi M.D.   On: 01/05/2020 11:24    EKG: I independently viewed the EKG done and my findings are as followed: Sinus rhythm at rate of 93 bpm with T wave inversion in V3-V5  Assessment/Plan Present on Admission: . Acute metabolic encephalopathy  Principal Problem:   Acute metabolic encephalopathy Active Problems:   Hypokalemia   Polysubstance abuse (HCC)   Elevated CK   Transaminitis   Acute metabolic encephalopathy possibly due to polysubstance abuse Patient was alert but nonverbal MRI brain without contrast showed no abnormality LP was done and pending Empiric IV antibiotic was started by ED physician per [facility] neurologist recommendation, she will continue with same at this time with plan to de-escalate pending CSF Gram stain  Continue fall precaution and neuro checks Continue one-to-one  observation safety precaution EEG will be done in the morning Neurology will be consulted for further recommendation.  Hypokalemia K+ 3.3, this will be replenished  Elevated CK CK 324, continue IV hydration  Elevated transaminitis AST 129, ALT 121 Continue to monitor liver enzymes.  Polysubstance abuse  Patient was positive for benzodiazepine (Ativan was given in the ED) and THC She will be counseled for polysubstance abuse cessation when more stable   DVT prophylaxis: Lovenox  Code Status: Full code  Family Communication: None at bedside  Disposition Plan:  Patient is from:                        home Anticipated DC to:                   SNF or family members home Anticipated DC date:               1 day Anticipated DC barriers:             Consults called: Neurology  Admission status: Observation    Frankey Shown MD [facility]  If 7PM-7AM, please contact night-coverage [email or web address] Password [facility]  01/06/2020, 12:59 AM

## 2020-01-05 NOTE — ED Provider Notes (Signed)
Patient was left at change of shift to see if her mental status improves.  I had actually seen her yesterday morning for her similar appearance for suspected drug ingestion.  At 4:15 AM patient is awake, however she does not respond verbally.  She gives brief eye contact.  She has some restraints and she is trying to get out of bed.  At this point like she needs to stay in restraints that she does not fall out of bed and harm her self.  Patient's vital signs have improved during her hospitalization, her temperature at 3:30 AM was 98.3, heart rate in the 80s, blood pressure 126/82.  Her pulse ox is now 100%.  Recheck at 7:25 AM patient is awake and looking around, however she is still nonverbal.  She is noted to have some mild tremor.  She was given Ativan IV.  Dr Estell Harpin will check later.    Devoria Albe, MD 01/05/20 [phone]

## 2020-01-05 NOTE — Progress Notes (Signed)
Pharmacy Antibiotic Note  Anita Davis is a 34 y.o. female admitted on 01/04/2020 with AMS.  Pt was found down in parking lot by police.  Pt has hx of substance abuse and frequent ER visits.   Pharmacy has been consulted for Vancomycin dosing for r/o meningitis.  Tm 100.1, WBC wnl.  No abnormalities seen on MRI.  Plan: Vanc 1500mg  IV x 1 dose, then Vanc 1gm IV q12h Follow-up further Rocephin orders Follow-up LP results     Temp (24hrs), Avg:99.3 F (37.4 C), Min:98.3 F (36.8 C), Max:100.1 F (37.8 C)  Recent Labs  Lab 12/31/19 0236 01/02/20 0304 01/04/20 0546 01/04/20 1651 01/05/20 1122  WBC 5.1  --  4.6 7.4  --   CREATININE 0.79 0.76 0.82 1.52* 0.98    Estimated Creatinine Clearance: 78.1 mL/min (by C-G formula based on SCr of 0.98 mg/dL).    No Known Allergies  Antimicrobials this admission: Vanc 9/14 >> Rocephin x 1 9/14   Dose adjustments this admission:   Microbiology results:  Thank you for allowing pharmacy to be a part of this patient's care.  10/14 01/05/2020 7:01 PM

## 2020-01-06 ENCOUNTER — Inpatient Hospital Stay (HOSPITAL_COMMUNITY)
Admit: 2020-01-06 | Discharge: 2020-01-06 | Disposition: A | Discharge status: Home / Self Care | Payer: Medicaid Other | Attending: Internal Medicine | Admitting: Internal Medicine

## 2020-01-06 DIAGNOSIS — E663 Overweight: Secondary | ICD-10-CM | POA: Diagnosis present

## 2020-01-06 DIAGNOSIS — Z781 Physical restraint status: Secondary | ICD-10-CM | POA: Diagnosis not present

## 2020-01-06 DIAGNOSIS — A029 Salmonella infection, unspecified: Secondary | ICD-10-CM | POA: Diagnosis not present

## 2020-01-06 DIAGNOSIS — K567 Ileus, unspecified: Secondary | ICD-10-CM | POA: Diagnosis not present

## 2020-01-06 DIAGNOSIS — T43691A Poisoning by other psychostimulants, accidental (unintentional), initial encounter: Secondary | ICD-10-CM | POA: Diagnosis present

## 2020-01-06 DIAGNOSIS — F202 Catatonic schizophrenia: Secondary | ICD-10-CM | POA: Diagnosis not present

## 2020-01-06 DIAGNOSIS — G40919 Epilepsy, unspecified, intractable, without status epilepticus: Secondary | ICD-10-CM | POA: Diagnosis present

## 2020-01-06 DIAGNOSIS — Z8619 Personal history of other infectious and parasitic diseases: Secondary | ICD-10-CM | POA: Diagnosis not present

## 2020-01-06 DIAGNOSIS — Z818 Family history of other mental and behavioral disorders: Secondary | ICD-10-CM | POA: Diagnosis not present

## 2020-01-06 DIAGNOSIS — E860 Dehydration: Secondary | ICD-10-CM | POA: Diagnosis present

## 2020-01-06 DIAGNOSIS — G40A11 Absence epileptic syndrome, intractable, with status epilepticus: Secondary | ICD-10-CM | POA: Diagnosis present

## 2020-01-06 DIAGNOSIS — E876 Hypokalemia: Secondary | ICD-10-CM | POA: Diagnosis present

## 2020-01-06 DIAGNOSIS — E43 Unspecified severe protein-calorie malnutrition: Secondary | ICD-10-CM | POA: Diagnosis present

## 2020-01-06 DIAGNOSIS — Y833 Surgical operation with formation of external stoma as the cause of abnormal reaction of the patient, or of later complication, without mention of misadventure at the time of the procedure: Secondary | ICD-10-CM | POA: Diagnosis not present

## 2020-01-06 DIAGNOSIS — R739 Hyperglycemia, unspecified: Secondary | ICD-10-CM | POA: Diagnosis not present

## 2020-01-06 DIAGNOSIS — F1123 Opioid dependence with withdrawal: Secondary | ICD-10-CM | POA: Diagnosis present

## 2020-01-06 DIAGNOSIS — Z66 Do not resuscitate: Secondary | ICD-10-CM | POA: Diagnosis not present

## 2020-01-06 DIAGNOSIS — N390 Urinary tract infection, site not specified: Secondary | ICD-10-CM | POA: Diagnosis present

## 2020-01-06 DIAGNOSIS — R748 Abnormal levels of other serum enzymes: Secondary | ICD-10-CM | POA: Diagnosis present

## 2020-01-06 DIAGNOSIS — F314 Bipolar disorder, current episode depressed, severe, without psychotic features: Secondary | ICD-10-CM | POA: Diagnosis present

## 2020-01-06 DIAGNOSIS — Z9181 History of falling: Secondary | ICD-10-CM | POA: Diagnosis not present

## 2020-01-06 DIAGNOSIS — Z20822 Contact with and (suspected) exposure to covid-19: Secondary | ICD-10-CM | POA: Diagnosis present

## 2020-01-06 DIAGNOSIS — G40901 Epilepsy, unspecified, not intractable, with status epilepticus: Secondary | ICD-10-CM

## 2020-01-06 DIAGNOSIS — F09 Unspecified mental disorder due to known physiological condition: Secondary | ICD-10-CM | POA: Diagnosis not present

## 2020-01-06 DIAGNOSIS — R4020 Unspecified coma: Secondary | ICD-10-CM | POA: Diagnosis not present

## 2020-01-06 DIAGNOSIS — F05 Delirium due to known physiological condition: Secondary | ICD-10-CM | POA: Diagnosis not present

## 2020-01-06 DIAGNOSIS — G9341 Metabolic encephalopathy: Secondary | ICD-10-CM | POA: Diagnosis present

## 2020-01-06 DIAGNOSIS — Z79899 Other long term (current) drug therapy: Secondary | ICD-10-CM | POA: Diagnosis not present

## 2020-01-06 DIAGNOSIS — F15920 Other stimulant use, unspecified with intoxication, uncomplicated: Secondary | ICD-10-CM | POA: Diagnosis present

## 2020-01-06 DIAGNOSIS — F17210 Nicotine dependence, cigarettes, uncomplicated: Secondary | ICD-10-CM | POA: Diagnosis present

## 2020-01-06 DIAGNOSIS — R7401 Elevation of levels of liver transaminase levels: Secondary | ICD-10-CM | POA: Diagnosis present

## 2020-01-06 LAB — COMPREHENSIVE METABOLIC PANEL
ALT: 141 U/L — ABNORMAL HIGH (ref 0–44)
AST: 167 U/L — ABNORMAL HIGH (ref 15–41)
Albumin: 4 g/dL (ref 3.5–5.0)
Alkaline Phosphatase: 50 U/L (ref 38–126)
Anion gap: 12 (ref 5–15)
BUN: 25 mg/dL — ABNORMAL HIGH (ref 6–20)
CO2: 21 mmol/L — ABNORMAL LOW (ref 22–32)
Calcium: 8.7 mg/dL — ABNORMAL LOW (ref 8.9–10.3)
Chloride: 108 mmol/L (ref 98–111)
Creatinine, Ser: 0.75 mg/dL (ref 0.44–1.00)
GFR calc Af Amer: >60 mL/min (ref >60)
GFR calc non Af Amer: >60 mL/min (ref >60)
Glucose, Bld: 79 mg/dL (ref 70–99)
Potassium: 3.6 mmol/L (ref 3.5–5.1)
Sodium: 141 mmol/L (ref 135–145)
Total Bilirubin: 1.1 mg/dL (ref 0.3–1.2)
Total Protein: 7.9 g/dL (ref 6.5–8.1)

## 2020-01-06 LAB — PROTIME-INR
INR: 1.1 (ref 0.8–1.2)
Prothrombin Time: 13.7 seconds (ref 11.4–15.2)

## 2020-01-06 LAB — APTT: aPTT: 32 seconds (ref 24–36)

## 2020-01-06 LAB — CBC
HCT: 41.6 % (ref 36.0–46.0)
Hemoglobin: 13.8 g/dL (ref 12.0–15.0)
MCH: 31.3 pg (ref 26.0–34.0)
MCHC: 33.2 g/dL (ref 30.0–36.0)
MCV: 94.3 fL (ref 80.0–100.0)
Platelets: 205 10*3/uL (ref 150–400)
RBC: 4.41 MIL/uL (ref 3.87–5.11)
RDW: 12.3 % (ref 11.5–15.5)
WBC: 4 10*3/uL (ref 4.0–10.5)
nRBC: 0 % (ref 0.0–0.2)

## 2020-01-06 LAB — HIV ANTIBODY (ROUTINE TESTING W REFLEX): HIV Screen 4th Generation wRfx: NONREACTIVE

## 2020-01-06 MED ORDER — LEVETIRACETAM IN NACL 500 MG/100ML IV SOLN
500.0000 mg | Freq: Two times a day (BID) | INTRAVENOUS | Status: DC
  Administered 2020-01-07: 500 mg via INTRAVENOUS
  Filled 2020-01-06: qty 100

## 2020-01-06 MED ORDER — LEVETIRACETAM IN NACL 500 MG/100ML IV SOLN
500.0000 mg | Freq: Two times a day (BID) | INTRAVENOUS | Status: DC
  Administered 2020-01-06: 500 mg via INTRAVENOUS
  Filled 2020-01-06: qty 100

## 2020-01-06 MED ORDER — SODIUM CHLORIDE 0.9 % IV SOLN: Freq: Once | INTRAVENOUS | Status: AC

## 2020-01-06 MED ORDER — SODIUM CHLORIDE 0.9 % IV SOLN
2.0000 g | Freq: Two times a day (BID) | INTRAVENOUS | Status: DC
  Administered 2020-01-06 (×2): 2 g via INTRAVENOUS
  Filled 2020-01-06 (×2): qty 20

## 2020-01-06 MED ORDER — CHLORHEXIDINE GLUCONATE CLOTH 2 % EX PADS
6.0000 | MEDICATED_PAD | Freq: Every day | CUTANEOUS | Status: DC
  Administered 2020-01-06 – 2020-05-17 (×95): 6 via TOPICAL

## 2020-01-06 MED ORDER — SODIUM CHLORIDE 0.9 % IV SOLN
4000.0000 mg | Freq: Once | INTRAVENOUS | Status: AC
  Administered 2020-01-06: 4000 mg via INTRAVENOUS
  Filled 2020-01-06: qty 30

## 2020-01-06 NOTE — Progress Notes (Signed)
PROGRESS NOTE  Anita Davis  DOB: 1986-01-30  PCP: [name], PA-C ERX:[id number]  DOA: 01/04/2020  LOS: 0 days   Chief Complaint  Patient presents with  . Medical Clearance    Brief narrative: Patient is a 34 y.o. female with medical history significant for hepatitis C and polysubstance abuse who was brought to the ED by EMS for altered mental status.    9/13, patient was brought to the ED around 3:20 AM by family member due to disorientation which was suspected to be due to substance abuse (methamphetamine).  Later in the morning, she was more alert and was hence discharged.   Later in the day 9/13, local police was called to a local business when they found the patient lying down in the parking lot.  She was brought to the ED for further evaluation management.  Patient states that she had methamphetamine in her pants, however, this was not found when she was searchred.  She was then taken to the ED for further evaluation and management.    Work-up in the ED included labs, urinalysis, UDS, MRI.  UDS positive for benzodiazepine and THC, negative for amphetamine.  Work-up unremarkable otherwise. She remained altered in the ED for several hours and received intermittent doses of IV Ativan to control agitation.  After about 24 hours in the ED, ED attending reached out to neurologist at: Dr. Amada Jupiter. Because of patient's persistent altered mentation without clear cause, further work-up was recommended. Patient underwent lumbar puncture and was initiated on empiric antibiotics. Patient was admitted to hospitalist service for further evaluation management.  Subjective: Patient was seen and examined this afternoon. Alert, awake, eyes open.  But not able to have a conversation or follow command.   Spontaneous movement of all extremities seen.  She is in restraints and tries to fight with restraints.   Per staff, patient earlier said a single word 'hello' holding the phone.    Chart reviewed Continues to run low-grade fever, T-max 100.8 today.  Blood pressure stable.  Breathing on room air. Repeat labs this morning normal creatinine, elevated liver enzymes, normal CBC.  Assessment/Plan: Acute metabolic encephalopathy  -Unclear etiology  -Because of her habit of polysubstance abuse, altered mental status was suspected to be secondary to drug abuse.   -However UDS is negative for methamphetamine or cocaine.  Positive for THC and BZD (probably due to Ativan given in the ED).  THC unlikely to explain her symptoms. -MRI brain normal.  Lumbar puncture normal.   -On examination today, she is alert, awake, eyes open.  But not able to have a conversation or follow command.  Pupils bilaterally dilated. -Spontaneous movement of all extremities seen.  She is in restraints and tries to fight with restraints.   Per staff, patient earlier said a single word 'hello' holding the phone.   -Pending EEG. -Called neurology consultation. -Continue fall precaution and neuro checks -Continue one-to-one observation safety precaution  Hypokalemia -Replaced. Recent Labs  Lab 01/02/20 0304 01/04/20 0546 01/04/20 1651 01/05/20 1122 01/06/20 0610  K 3.6 3.8 3.5 3.3* 3.6   Elevated CK CK 324, continue IV hydration  Elevated transaminitis History of hepatitis C Recent Labs  Lab 01/04/20 0546 01/04/20 1651 01/06/20 0610  AST 130* 129* 167*  ALT 121* 121* 141*  ALKPHOS 63 60 50  BILITOT 1.0 1.1 1.1  PROT 9.2* 9.3* 7.9  ALBUMIN 5.0 4.8 4.0   Polysubstance abuse  -Counseled to quit.  Mobility: Encourage ambulation once mental status improves Code Status:  Code Status: Full Code  Nutritional status: Body mass index is 27.44 kg/m.     Diet Order            Diet regular Room service appropriate? Yes; Fluid consistency: Thin  Diet effective now                 DVT prophylaxis: enoxaparin (LOVENOX) injection 40 mg Start: 01/05/20 2230 SCDs Start: 01/05/20  2217   Antimicrobials:  None Fluid: Normal saline at 75 mL/h. Consultants: Neurology Family Communication:  None at bedside  Status is: Observation  The patient will require care spanning > 2 midnights and should be moved to inpatient because: Persistent severe electrolyte disturbances, Ongoing diagnostic testing needed not appropriate for outpatient work up and IV treatments appropriate due to intensity of illness or inability to take PO  Dispo: The patient is from: Home              Anticipated d/c is to: Home              Anticipated d/c date is: 3 days              Patient currently is not medically stable to d/c.       Infusions:  . sodium chloride    . cefTRIAXone (ROCEPHIN)  IV 2 g (01/06/20 1035)  . vancomycin Stopped (01/06/20 8588)    Scheduled Meds: . enoxaparin (LOVENOX) injection  40 mg Subcutaneous Q24H    Antimicrobials: Anti-infectives (From admission, onward)   Start     Dose/Rate Route Frequency Ordered Stop   01/06/20 0900  cefTRIAXone (ROCEPHIN) 2 g in sodium chloride 0.9 % 100 mL IVPB        2 g 200 mL/hr over 30 Minutes Intravenous Every 12 hours 01/06/20 0105     01/06/20 0800  vancomycin (VANCOCIN) IVPB 1000 mg/200 mL premix       "Followed by" Linked Group Details   1,000 mg 200 mL/hr over 60 Minutes Intravenous Every 12 hours 01/05/20 1904     01/05/20 2000  vancomycin (VANCOREADY) IVPB 1500 mg/300 mL       "Followed by" Linked Group Details   1,500 mg 150 mL/hr over 120 Minutes Intravenous  Once 01/05/20 1904 01/06/20 0127   01/05/20 1830  cefTRIAXone (ROCEPHIN) 2 g in sodium chloride 0.9 % 100 mL IVPB        2 g 200 mL/hr over 30 Minutes Intravenous  Once 01/05/20 1829 01/05/20 2220      PRN meds:    Objective: Vitals:   01/06/20 1200 01/06/20 1251  BP: (!) 148/78 (!) 143/90  Pulse:  77  Resp:  16  Temp:  (!) 100.8 F (38.2 C)  SpO2:  99%    Intake/Output Summary (Last 24 hours) at 01/06/2020 1422 Last data filed at  01/06/2020 1034 Gross per 24 hour  Intake 1715.08 ml  Output 800 ml  Net 915.08 ml   Filed Weights   01/06/20 1251  Weight: 74.8 kg   Weight change:  Body mass index is 27.44 kg/m.   Physical Exam: General exam: Somewhat restless but not in physical distress or pain Skin: No rashes, lesions or ulcers. HEENT: Atraumatic, normocephalic, supple neck, no obvious bleeding Lungs: Clear to auscultate bilaterally CVS: Regular rate and rhythm, no murmur GI/Abd soft, nontender, nondistended, bowel sound present CNS: Alert, awake, unable to follow commands or have a conversation.  Dilated pupil Psychiatry: Normal mood Extremities: No pedal edema, no calf tenderness  Data  Review: I have personally reviewed the laboratory data and studies available.  Recent Labs  Lab 12/31/19 0236 01/04/20 0546 01/04/20 1651 01/06/20 0610  WBC 5.1 4.6 7.4 4.0  NEUTROABS  --  2.5 4.8  --   HGB 13.3 16.0* 16.0* 13.8  HCT 40.2 49.1* 47.7* 41.6  MCV 93.3 94.8 91.6 94.3  PLT 213 196 278 205   Recent Labs  Lab 01/02/20 0304 01/04/20 0546 01/04/20 1651 01/05/20 1122 01/06/20 0610  NA 136 140 143 142 141  K 3.6 3.8 3.5 3.3* 3.6  CL 99 102 105 105 108  CO2 25 23 24 25  21*  GLUCOSE 133* 102* 139* 105* 79  BUN 7 17 27* 31* 25*  CREATININE 0.76 0.82 1.52* 0.98 0.75  CALCIUM 9.4 9.9 10.3 9.2 8.7*    Signed, , MD [facility] 01/06/2020

## 2020-01-06 NOTE — ED Notes (Signed)
Performed bladder scan on patient. Found 829 mL of urine.

## 2020-01-06 NOTE — Procedures (Signed)
Patient Name: Anita Davis  MRN: [id number]  Epilepsy Attending: Charlsie Quest  Referring Physician/Provider: Dr Westley Foots Date: 01/06/2020 Duration: 26.15 mins  Patient history: 34 y.o.femalewith medical history significant forhepatitis C and polysubstance abuse who was brought to the ED by EMS for altered mental status. EEG to evaluate for seizure.  Level of alertness: Awake  AEDs during EEG study: None  Technical aspects: This EEG study was done with scalp electrodes positioned according to the 10-20 International system of electrode placement. Electrical activity was acquired at a sampling rate of 500Hz  and reviewed with a high frequency filter of 70Hz  and a low frequency filter of 1Hz . EEG data were recorded continuously and digitally stored.   Description: No posterior dominant rhythm was seen. EEG showed frequent seizures without clinical signs arising from left centrotemporal region. EEG showed 5-6hz  spike and wave as well as theta slowing in left centrotemporal region which then spread to involve all left hemisphere followed by right hemisphere and evolved to 1-2hz  delta activity.  Generalized 2-3hz  delta slowing with overriding15-18hz  beta activity was also noted. Hyperventilation and photic stimulation were not performed.     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 Additionally, there is severe diffuse encephalopathy, non specific etiology but likely related to seizures.   Dr and Dr were notified.  Armen Waring 

## 2020-01-06 NOTE — Consult Note (Addendum)
[facility] Laszlo Ellerby A. Gerilyn Pilgrim, MD     [email or web address]          Anita Davis is an 34 y.o. female.   ASSESSMENT/PLAN: 1. ACUTE ENCEPHALOPATHY: Likely due to status epilepticus and direct effect of drug overdose due to psychostimulant. The patient has been loaded with IV Keppra. Maintenance dose will be initiated. Repeat EEG is recommended tomorrow if 24 hour EEG is not available. The CSF analysis so far does not support a diagnosis of any type is central nervous system infection and therefore antibiotics should be discontinued.  Consider repeating MRI to get a complete scan if not improved since the scan was discontinued due to agitation and movement. 2. Polysubstance drug abuse.    The patient is a 34 year old white female who initially presented with confusion and unresponsiveness. The during the initial evaluation patient was noted to be agitated, tachycardic and had a fever of 101.9. Fever was approximately 60 hours ago. The patient was also noted to have dilated pupils. The picture was consistent with the amphetamine intoxication or other stimulant intoxication. There is a history of  Amphetamine use and polysubstance drug abuse. Patient has been noted to be quite agitated and the has to be restrained. Limited MRI showed no evidence of acute changes on DWI but nor the scans were done. The patient remains agitated and confused and is nonverbal. Review of systems not obtainable.    GENERAL:  She is restless and currently in 4 point restraint.  HEENT:  Neck is supple no trauma noted.  Dentition is fair. Obvious caries are not noted.  ABDOMEN: soft  EXTREMITIES: No edema; several small bruises noted on the legs bilaterally   BACK:  Normal  SKIN: Normal by inspection.    MENTAL STATUS:  She is awake and alert. She occasionally follows midline commands but no other commands. She is essentially mute.  CRANIAL NERVES: Pupils are equal, round and reactive to light and  accomodation; extra ocular movements are full, there is no significant nystagmus; visual fields are full; upper and lower facial muscles are normal in strength and symmetric, there is no flattening of the nasolabial folds; tongue is midline; uvula is midline; shoulder elevation is normal.  MOTOR: Normal tone, bulk and strength; no pronator drift.  COORDINATION:  No tremors, myoclonus or dysmetria are noted. No parkinsonian features.  REFLEXES: Deep tendon reflexes are symmetrical and normal.   SENSATION: Normal to pain.       Blood pressure (!) 148/89, pulse (!) 58, temperature 98.4 F (36.9 C), temperature source Oral, resp. rate 17, height 5\' 5"  (1.651 m), weight 71.2 kg, last menstrual period 12/12/2019, SpO2 98 %.  Past Medical History:  Diagnosis Date  . Allergy   . Bronchitis   . Hepatitis-C   . History of gestational diabetes   . HPV in female   . Substance abuse [facility])     Past Surgical History:  Procedure Laterality Date  . HEMORRHOID BANDING  age 76    Family History  Problem Relation Age of Onset  . Depression Mother   . Cervical cancer Mother   . Hypertension Father   . Diabetes Father     Social History:  reports that she has been smoking cigarettes and cigars. She has smoked for the past 24.00 years. She has never used smokeless tobacco. She reports previous drug use. Drugs: IV, Cocaine, Hydrocodone, Oxycodone, Marijuana, and Heroin. She reports that she does not drink alcohol.  Allergies: No Known Allergies  Medications: Prior  to Admission medications   Medication Sig Start Date End Date Taking? Authorizing Provider  cephALEXin (KEFLEX) 500 MG capsule Take 500 mg by mouth 2 (two) times daily. For 5 days. 12/24/19   [provider]  cetirizine (ZYRTEC) 10 MG tablet Take 10 mg by mouth daily.    [provider]  hydrOXYzine (ATARAX/VISTARIL) 25 MG tablet Take 1-2 tablets (25-50 mg total) by mouth every 6 (six) hours as needed for  anxiety. Patient taking differently: Take 75 mg by mouth 5 (five) times daily.  12/31/19   Gilda Crease, MD  ibuprofen (ADVIL) 200 MG tablet Take 200 mg by mouth every 6 (six) hours as needed.    [provider]  METHADONE HCL PO Take 60 mg by mouth daily.     [provider]  ondansetron (ZOFRAN-ODT) 4 MG disintegrating tablet Take 4 mg by mouth 3 (three) times daily as needed for nausea or vomiting. Starting 12/24/2019 x 4 days. 12/24/19   [provider]  Polyethylene Glycol 3350 (MIRALAX PO) Take by mouth.    [provider]    Scheduled Meds: . Chlorhexidine Gluconate Cloth  6 each Topical Daily  . enoxaparin (LOVENOX) injection  40 mg Subcutaneous Q24H   Continuous Infusions: . cefTRIAXone (ROCEPHIN)  IV 2 g (01/06/20 1035)  . [START ON 01/07/2020] levETIRAcetam    . vancomycin Stopped (01/06/20 0981)   PRN Meds:.     Results for orders placed or performed during the hospital encounter of 01/04/20 (from the past 48 hour(s))  SARS Coronavirus 2 by RT PCR (hospital order, performed in [facility] hospital lab) Nasopharyngeal Nasopharyngeal Swab     Status: None   Collection Time: 01/05/20 10:08 AM   Specimen: Nasopharyngeal Swab  Result Value Ref Range   SARS Coronavirus 2 NEGATIVE NEGATIVE    Comment: (NOTE) SARS-CoV-2 target nucleic acids are NOT DETECTED.  The SARS-CoV-2 RNA is generally detectable in upper and lower respiratory specimens during the acute phase of infection. The lowest concentration of SARS-CoV-2 viral copies this assay can detect is 250 copies / mL. A negative result does not preclude SARS-CoV-2 infection and should not be used as the sole basis for treatment or other patient management decisions.  A negative result may occur with improper specimen collection / handling, submission of specimen other than nasopharyngeal swab, presence of viral mutation(s) within the areas targeted by this assay, and inadequate  number of viral copies (<250 copies / mL). A negative result must be combined with clinical observations, patient history, and epidemiological information.  Fact Sheet for Patients:   BoilerBrush.com.cy  Fact Sheet for Healthcare Providers: https://pope.com/  This test is not yet approved or  cleared by the [place] FDA and has been authorized for detection and/or diagnosis of SARS-CoV-2 by FDA under an Emergency Use Authorization (EUA).  This EUA will remain in effect (meaning this test can be used) for the duration of the COVID-19 declaration under Section 564(b)(1) of the Act, 21 U.S.C. section 360bbb-3(b)(1), unless the authorization is terminated or revoked sooner.  Performed at [facility], [address] [place], [place] [address]   Basic metabolic panel     Status: Abnormal   Collection Time: 01/05/20 11:22 AM  Result Value Ref Range   Sodium 142 135 - 145 mmol/L   Potassium 3.3 (L) 3.5 - 5.1 mmol/L   Chloride 105 98 - 111 mmol/L   CO2 25 22 - 32 mmol/L   Glucose, Bld 105 (H) 70 - 99  mg/dL    Comment: Glucose reference range applies only to samples taken after fasting for at least 8 hours.   BUN 31 (H) 6 - 20 mg/dL   Creatinine, Ser 1.300.98 0.44 - 1.00 mg/dL   Calcium 9.2 8.9 - 86.510.3 mg/dL   GFR calc non Af Amer >60 >60 mL/min   GFR calc Af Amer >60 >60 mL/min   Anion gap 12 5 - 15    Comment: Performed at [facility], [address] [place], [place] [address]  Urinalysis, Routine w reflex microscopic Urine, Catheterized     Status: Abnormal   Collection Time: 01/05/20  4:32 PM  Result Value Ref Range   Color, Urine AMBER (A) YELLOW    Comment: BIOCHEMICALS MAY BE AFFECTED BY COLOR   APPearance HAZY (A) CLEAR   Specific Gravity, Urine 1.025 1.005 - 1.030   pH 5.0 5.0 - 8.0   Glucose, UA NEGATIVE NEGATIVE mg/dL   Hgb urine dipstick NEGATIVE NEGATIVE   Bilirubin Urine NEGATIVE NEGATIVE   Ketones, ur 5 (A) NEGATIVE  mg/dL   Protein, ur 30 (A) NEGATIVE mg/dL   Nitrite NEGATIVE NEGATIVE   Leukocytes,Ua NEGATIVE NEGATIVE   RBC / HPF 0-5 0 - 5 RBC/hpf   WBC, UA 0-5 0 - 5 WBC/hpf   Bacteria, UA NONE SEEN NONE SEEN   Squamous Epithelial / LPF 0-5 0 - 5   Mucus PRESENT    Hyaline Casts, UA PRESENT     Comment: Performed at [facility], [address] [place], [place] [address]  Rapid urine drug screen (hospital performed)     Status: Abnormal   Collection Time: 01/05/20  4:33 PM  Result Value Ref Range   Opiates NONE DETECTED NONE DETECTED   Cocaine NONE DETECTED NONE DETECTED   Benzodiazepines POSITIVE (A) NONE DETECTED   Amphetamines NONE DETECTED NONE DETECTED   Tetrahydrocannabinol POSITIVE (A) NONE DETECTED   Barbiturates NONE DETECTED NONE DETECTED    Comment: (NOTE) DRUG SCREEN FOR MEDICAL PURPOSES ONLY.  IF CONFIRMATION IS NEEDED FOR ANY PURPOSE, NOTIFY LAB WITHIN 5 DAYS.  LOWEST DETECTABLE LIMITS FOR URINE DRUG SCREEN Drug Class                     Cutoff (ng/mL) Amphetamine and metabolites    1000 Barbiturate and metabolites    200 Benzodiazepine                 200 Tricyclics and metabolites     300 Opiates and metabolites        300 Cocaine and metabolites        300 THC                            50 Performed at [facility], [address] [place], [place] [address]   CSF cell count with differential     Status: Abnormal   Collection Time: 01/05/20  6:28 PM  Result Value Ref Range   Tube # 4    Color, CSF COLORLESS COLORLESS   Appearance, CSF CLEAR (A) CLEAR   RBC Count, CSF 0 0 /cu mm   WBC, CSF 1 0 - 5 /cu mm   Segmented Neutrophils-CSF TOO FEW TO COUNT, SMEAR AVAILABLE FOR REVIEW 0 - 6 %   Lymphs, CSF TOO FEW TO COUNT, SMEAR AVAILABLE FOR REVIEW 40 - 80 %   Monocyte-Macrophage-Spinal Fluid TOO FEW TO COUNT, SMEAR AVAILABLE FOR REVIEW 15 -  45 %   Eosinophils, CSF TOO FEW TO COUNT, SMEAR AVAILABLE FOR REVIEW 0 - 1 %    Comment: Performed at [facility], [address] [place], [place] [address]  Protein and glucose, CSF     Status: None   Collection Time: 01/05/20  6:28 PM  Result Value Ref Range   Glucose, CSF 53 40 - 70 mg/dL   Total  Protein, CSF 19 15 - 45 mg/dL    Comment: Performed at [facility], [address] [place], [place] [address]  HIV Antibody (routine testing w rflx)     Status: None   Collection Time: 01/05/20  9:15 PM  Result Value Ref Range   HIV Screen 4th Generation wRfx Non Reactive Non Reactive    Comment: Performed at [facility], [address], [place], [place] [address]  Blood culture (routine x 2)     Status: None (Preliminary result)   Collection Time: 01/05/20  9:15 PM   Specimen: BLOOD LEFT HAND  Result Value Ref Range   Specimen Description BLOOD LEFT HAND    Special Requests      BOTTLES DRAWN AEROBIC AND ANAEROBIC Blood Culture adequate volume   Culture      NO GROWTH < 12 HOURS Performed at [facility], [address], [place], [place] [address]    Report Status PENDING   Blood culture (routine x 2)     Status: None (Preliminary result)   Collection Time: 01/05/20  9:18 PM   Specimen: BLOOD LEFT ARM  Result Value Ref Range   Specimen Description BLOOD LEFT ARM    Special Requests      BOTTLES DRAWN AEROBIC AND ANAEROBIC Blood Culture adequate volume   Culture      NO GROWTH < 12 HOURS Performed at [facility], [address] [place], [place] [address]    Report Status PENDING   Gram stain     Status: None   Collection Time: 01/05/20  9:24 PM   Specimen: CSF  Result Value Ref Range   Specimen Description CSF    Special Requests NONE    Gram Stain      CYTOSPIN SMEAR NO WBC SEEN NO ORGANISMS SEEN Performed at [facility], [address] [place], [place] [address]    Report Status 01/05/2020 FINAL   Comprehensive metabolic panel     Status: Abnormal   Collection Time: 01/06/20  6:10 AM  Result Value Ref Range   Sodium 141 135 - 145 mmol/L   Potassium 3.6 3.5 - 5.1 mmol/L   Chloride 108  98 - 111 mmol/L   CO2 21 (L) 22 - 32 mmol/L   Glucose, Bld 79 70 - 99 mg/dL    Comment: Glucose reference range applies only to samples taken after fasting for at least 8 hours.   BUN 25 (H) 6 - 20 mg/dL   Creatinine, Ser 1.85 0.44 - 1.00 mg/dL   Calcium 8.7 (L) 8.9 - 10.3 mg/dL   Total Protein 7.9 6.5 - 8.1 g/dL   Albumin 4.0 3.5 - 5.0 g/dL   AST 631 (H) 15 - 41 U/L   ALT 141 (H) 0 - 44 U/L   Alkaline Phosphatase 50 38 - 126 U/L   Total Bilirubin 1.1 0.3 - 1.2 mg/dL   GFR calc non Af Amer >60 >60 mL/min   GFR calc Af Amer >60 >60 mL/min   Anion gap 12 5 - 15    Comment: Performed at [facility], [address], [place], [place] [address]  CBC     Status: None   Collection Time: 01/06/20  6:10 AM  Result Value Ref Range   WBC 4.0 4.0 - 10.5 K/uL   RBC 4.41 3.87 - 5.11 MIL/uL   Hemoglobin 13.8 12.0 - 15.0 g/dL   HCT 27.0 36 - 46 %   MCV 94.3 80.0 - 100.0 fL   MCH 31.3 26.0 - 34.0 pg   MCHC 33.2 30.0 - 36.0 g/dL   RDW 35.0 09.3 - 81.8 %   Platelets 205 150 - 400 K/uL   nRBC 0.0 0.0 - 0.2 %    Comment: Performed at [facility], [address] [place], [place] [address]  Protime-INR     Status: None   Collection Time: 01/06/20  6:10 AM  Result Value Ref Range   Prothrombin Time 13.7 11.4 - 15.2 seconds   INR 1.1 0.8 - 1.2    Comment: (NOTE) INR goal varies based on device and disease states. Performed at [facility], [address], [place], [place] [address]   APTT     Status: None   Collection Time: 01/06/20  6:10 AM  Result Value Ref Range   aPTT 32 24 - 36 seconds    Comment: Performed at [facility], [address], [place], [place] [address]    Studies/Results:  EEG Description: No posterior dominant rhythm was seen. EEG showed frequent seizures without clinical signs arising from left centrotemporal region. EEG showed 5-6hz  spike and wave as well as theta slowing in left centrotemporal region which then spread to involve all left hemisphere followed by right  hemisphere and evolved to 1-2hz  delta activity.  Generalized 2-3hz  delta slowing with overriding15-18hz  beta activity was also noted. Hyperventilation and photic stimulation were not performed.      ABNORMALITY - Focal non convulsive status epilepticus, left centro-temporal region - Continuous slow, generalized - Excessive beta, generalized   IMPRESSION: This study showed focal non convulsive status epilepticus arising from left centrotemporal region. Additionally, there is severe diffuse encephalopathy, non specific etiology but likely related to seizures     LIMITED MRI BRAIN  FINDINGS: Brain: Motion degraded study. Axial diffusion imaging only obtained. No sign of acute infarction or other restricted diffusion lesion. No sign of hydrocephalus, mass, hemorrhage or extra-axial collection.   Vascular: Major vessels at the base of the brain show flow.   Skull and upper cervical spine: No abnormality seen.   Sinuses/Orbits: No abnormality seen.   Other: None   IMPRESSION: Motion degraded study. Axial diffusion imaging only obtained. No abnormality seen to explain the clinical presentation.    Limited brain MRI shows no acute findings on DWI.    EEG is also personally reviewed and shows multiple episodes of generalized spike slow wave activity with a maximum in the left central temporal region. There is associated general delta waves after these episodes of spike slow wave activity.    This is a critical patient in status epilepticus.  Multiple discussions were done with the hospitalist and nursing staff in care of this patient.  Critical care time 90 minutes.  Leelan Rajewski A. Gerilyn Pilgrim, M.D.  Diplomate, Biomedical engineer of Psychiatry and Neurology ( Neurology). 01/06/2020, 6:57 PM

## 2020-01-06 NOTE — ED Notes (Signed)
Secretary brought phone because pt had phone call. Placed phone up to pt's ear and pt said "Hello" and did not say anything else. Pt still moving a lot and trying to get out of bed and restraints, staring blankly into the ceiling.

## 2020-01-06 NOTE — ED Notes (Signed)
Assisted pt with breakfast. Pt seemed as if she doesn't remember how to chew her food. Pt encouraged and reminded to chew her food. Pt drank juice and spit out eggs.

## 2020-01-06 NOTE — Progress Notes (Signed)
EEG complete - results pending 

## 2020-01-07 ENCOUNTER — Inpatient Hospital Stay (HOSPITAL_COMMUNITY): Payer: Medicaid Other

## 2020-01-07 ENCOUNTER — Inpatient Hospital Stay (HOSPITAL_COMMUNITY)
Admit: 2020-01-07 | Discharge: 2020-01-07 | Disposition: A | Discharge status: Home / Self Care | Payer: Medicaid Other | Attending: Neurology | Admitting: Neurology

## 2020-01-07 LAB — BASIC METABOLIC PANEL
Anion gap: 11 (ref 5–15)
BUN: 15 mg/dL (ref 6–20)
CO2: 22 mmol/L (ref 22–32)
Calcium: 8.8 mg/dL — ABNORMAL LOW (ref 8.9–10.3)
Chloride: 104 mmol/L (ref 98–111)
Creatinine, Ser: 0.6 mg/dL (ref 0.44–1.00)
GFR calc Af Amer: >60 mL/min (ref >60)
GFR calc non Af Amer: >60 mL/min (ref >60)
Glucose, Bld: 82 mg/dL (ref 70–99)
Potassium: 3.4 mmol/L — ABNORMAL LOW (ref 3.5–5.1)
Sodium: 137 mmol/L (ref 135–145)

## 2020-01-07 LAB — MAGNESIUM: Magnesium: 2.3 mg/dL (ref 1.7–2.4)

## 2020-01-07 LAB — CBC WITH DIFFERENTIAL/PLATELET
Abs Immature Granulocytes: 0.02 10*3/uL (ref 0.00–0.07)
Basophils Absolute: 0 10*3/uL (ref 0.0–0.1)
Basophils Relative: 1 %
Eosinophils Absolute: 0 10*3/uL (ref 0.0–0.5)
Eosinophils Relative: 0 %
HCT: 44.4 % (ref 36.0–46.0)
Hemoglobin: 14.4 g/dL (ref 12.0–15.0)
Immature Granulocytes: 1 %
Lymphocytes Relative: 63 %
Lymphs Abs: 2.6 10*3/uL (ref 0.7–4.0)
MCH: 30.8 pg (ref 26.0–34.0)
MCHC: 32.4 g/dL (ref 30.0–36.0)
MCV: 95.1 fL (ref 80.0–100.0)
Monocytes Absolute: 0.4 10*3/uL (ref 0.1–1.0)
Monocytes Relative: 9 %
Neutro Abs: 1.1 10*3/uL — ABNORMAL LOW (ref 1.7–7.7)
Neutrophils Relative %: 26 %
Platelets: 196 10*3/uL (ref 150–400)
RBC: 4.67 MIL/uL (ref 3.87–5.11)
RDW: 12.1 % (ref 11.5–15.5)
WBC: 4 10*3/uL (ref 4.0–10.5)
nRBC: 0 % (ref 0.0–0.2)

## 2020-01-07 LAB — GLUCOSE, CAPILLARY: Glucose-Capillary: 78 mg/dL (ref 70–99)

## 2020-01-07 LAB — MRSA PCR SCREENING: MRSA by PCR: POSITIVE — AB

## 2020-01-07 LAB — TSH: TSH: 0.955 u[IU]/mL (ref 0.350–4.500)

## 2020-01-07 LAB — VITAMIN B12: Vitamin B-12: 1488 pg/mL — ABNORMAL HIGH (ref 180–914)

## 2020-01-07 MED ORDER — LORAZEPAM 1 MG PO TABS
1.0000 mg | ORAL_TABLET | Freq: Four times a day (QID) | ORAL | Status: DC | PRN
  Administered 2020-01-10 – 2020-01-11 (×2): 1 mg via ORAL
  Filled 2020-01-07 (×3): qty 1

## 2020-01-07 MED ORDER — SODIUM CHLORIDE 0.9 % IV SOLN: Freq: Once | INTRAVENOUS | Status: AC

## 2020-01-07 MED ORDER — LORAZEPAM 2 MG/ML IJ SOLN: 1.0000 mg | INTRAMUSCULAR | Status: AC

## 2020-01-07 MED ORDER — SODIUM CHLORIDE 0.9 % IV SOLN
200.0000 mg | Freq: Two times a day (BID) | INTRAVENOUS | Status: DC
  Administered 2020-01-07 – 2020-01-24 (×35): 200 mg via INTRAVENOUS
  Filled 2020-01-07 (×48): qty 20

## 2020-01-07 MED ORDER — SODIUM CHLORIDE 0.9 % IV SOLN
1000.0000 mg | Freq: Every day | INTRAVENOUS | Status: DC
  Administered 2020-01-07 – 2020-01-08 (×2): 1000 mg via INTRAVENOUS
  Filled 2020-01-07 (×3): qty 8

## 2020-01-07 MED ORDER — LORAZEPAM 2 MG/ML IJ SOLN
1.0000 mg | Freq: Four times a day (QID) | INTRAMUSCULAR | Status: DC | PRN
  Administered 2020-01-09 – 2020-01-14 (×5): 1 mg via INTRAMUSCULAR
  Filled 2020-01-07 (×6): qty 1

## 2020-01-07 MED ORDER — MUPIROCIN 2 % EX OINT
1.0000 "application " | TOPICAL_OINTMENT | Freq: Two times a day (BID) | CUTANEOUS | Status: AC
  Administered 2020-01-07 – 2020-01-11 (×10): 1 via NASAL
  Filled 2020-01-07 (×2): qty 22

## 2020-01-07 MED ORDER — PYRIDOXINE HCL 100 MG/ML IJ SOLN
100.0000 mg | Freq: Every day | INTRAMUSCULAR | Status: DC
  Administered 2020-01-07 – 2020-01-24 (×18): 100 mg via INTRAVENOUS
  Filled 2020-01-07 (×22): qty 1

## 2020-01-07 MED ORDER — LEVETIRACETAM IN NACL 1500 MG/100ML IV SOLN
1500.0000 mg | Freq: Two times a day (BID) | INTRAVENOUS | Status: DC
  Administered 2020-01-07 – 2020-01-18 (×22): 1500 mg via INTRAVENOUS
  Filled 2020-01-07 (×23): qty 100

## 2020-01-07 NOTE — Procedures (Signed)
  [facility] Jasper Hanf A. Gerilyn Pilgrim, MD     [email or web address]           HISTORY: This is a 34 year old female who presents with the altered mental status due to status epilepticus.  This is a repeat EEG to assess improvement.  MEDICATIONS:  Current Facility-Administered Medications:  .  Chlorhexidine Gluconate Cloth 2 % PADS 6 each, 6 each, Topical, Daily, Dahal, Melina Schools, MD, 6 each at 01/07/20 0907 .  enoxaparin (LOVENOX) injection 40 mg, 40 mg, Subcutaneous, Q24H, Adefeso, Oladapo, DO, 40 mg at 01/06/20 2130 .  lacosamide (VIMPAT) 200 mg in sodium chloride 0.9 % 25 mL IVPB, 200 mg, Intravenous, Q12H, Teion Ballin, MD .  levETIRAcetam (KEPPRA) IVPB 1500 mg/ 100 mL premix, 1,500 mg, Intravenous, Q12H, Jataya Wann, MD .  mupirocin ointment (BACTROBAN) 2 % 1 application, 1 application, Nasal, BID, Dahal, Binaya, MD     ANALYSIS: A 16 channel recording using standard 10 20 measurements is conducted for 44 minutes.  During the initial part of the study there is evidence of electrographic seizure with generalized 1 Hz second spike slow wave activity.  The maximum seems to involve the left temporal central region.  The activity involves increasing frequency to 1.5-2 Hz.  This is associated with some's generalized slowing.  The patient does have a posterior dominant rhythm seen of 10 Hz bilaterally.  Photic stimulation and hyperventilation are not conducted.  The recording actually seems improved relative to the previous study done yesterday.   IMPRESSION: 1.  This abnormal recording showing an electrographic seizure.  The recording has improved however relative to the previous study done yesterday.      Elli Groesbeck A. Gerilyn Pilgrim, M.D.  Diplomate, Biomedical engineer of Psychiatry and Neurology ( Neurology).

## 2020-01-07 NOTE — Progress Notes (Signed)
EEG completed, results pending. 

## 2020-01-07 NOTE — Progress Notes (Signed)
Patient becoming more responsive. Able to drink a couple of sips of liquids and follow simple commands.  Patient laughed with staff. And is now sobbing. Patient was quietly holding in her cry but then she was able to cry with sound.  Patient very responsive to pain when phlebotomy stuck her for blood draw.  Will continue to assess neuro status on patient.  MD aware of responsiveness.  Awaiting for MRI to take her for scan.

## 2020-01-07 NOTE — Progress Notes (Signed)
EEG done today shows another electrographic seizure but improved compared to yesterday.  The patient will be given another antiepileptic medication and the dose of Keppra will be increased.  Repeat EEG is also recommended for tomorrow.

## 2020-01-07 NOTE — Consult Note (Signed)
WOC Nurse Consult Note: Reason for Consult:On skin assessment, noted nonblanchable erythema to bilateral heels.  Ordered PRevalon boots. Patient in wrist restraints for safety.  Wound type:stage 1 pressure Pressure Injury POA: No Measurement:LEft heel:  1 cm x 1 cm nonblanchable erytehma Right heel:  1 cmx 1 cm nonblanchable erythema. Wound BZX:YDSWVT Drainage (amount, consistency, odor) none Periwound:intact Dressing procedure/placement/frequency:PRevalon boots.  Will not follow at this time.  Please re-consult if needed.  Maple Hudson MSN, RN, FNP-BC CWON Wound, Ostomy, Continence Nurse Pager [phone]

## 2020-01-07 NOTE — Progress Notes (Signed)
PROGRESS NOTE  Anita Davis  DOB: February 03, 1986  PCP: [name], PA-C BTD:[id number]  DOA: 01/04/2020  LOS: 1 day   Chief Complaint  Patient presents with  . Medical Clearance    Brief narrative: Patient is a 34 y.o. female with medical history significant for hepatitis C and polysubstance abuse who was brought to the ED by EMS for altered mental status.    9/13, patient was brought to the ED around 3:20 AM by family member due to disorientation which was suspected to be due to substance abuse (methamphetamine).  Later in the morning, she was more alert and was hence discharged.   Later in the day 9/13, local police was called to a local business when they found the patient lying down in the parking lot.  She was brought to the ED for further evaluation management.  Patient states that she had methamphetamine in her pants, however, this was not found when she was searchred.  She was then taken to the ED for further evaluation and management.    Work-up in the ED included labs, urinalysis, UDS, MRI.  UDS positive for benzodiazepine and THC, negative for amphetamine.  Work-up unremarkable otherwise. She remained altered in the ED for several hours and received intermittent doses of IV Ativan to control agitation.  After about 24 hours in the ED, ED attending reached out to neurologist at: Dr. Amada Jupiter. Because of patient's persistent altered mentation without clear cause, further work-up was recommended. Patient underwent lumbar puncture and was initiated on empiric antibiotics. Patient was admitted to hospitalist service for further evaluation management. Neurology consultation was obtained. 9/15, EEG was obtained which showed that the patient is in status epilepticus.  She was transferred to ICU and started on Keppra with high loading dose.  Subjective: Patient was seen and examined this morning. Awake, unable to answer questions.  Fighting the restraints.  Not  agitated.  Assessment/Plan: Status epilepticus -Per neurology and EEG report. -  Acute metabolic encephalopathy  Status epilepticus -Patient had altered mental status mental status at presentation.  No convulsions or incontinence reported. -With EEG report, patient was noted to have underlying status epilepticus, likely is a direct effect of drug overdose due to psychostimulant. -Neurology loaded the patient with IV Keppra and is continuing the same. -Plan to repeat EEG today. -Patient also has an active order for transfer to [facility] for long-term EEG for long-term EEG monitoring. -Other possibilities of altered mental status were thought as well. -MRI brain normal.  Lumbar puncture normal.  Urine drug screen negative for methamphetamine or cocaine. -Continue fall precaution and neuro checks -Continue one-to-one observation safety precaution  Hypokalemia -Replaced. Recent Labs  Lab 01/04/20 0546 01/04/20 1651 01/05/20 1122 01/06/20 0610 01/07/20 0312  K 3.8 3.5 3.3* 3.6 3.4*  MG  --   --   --   --  2.3   Elevated CK CK 324, continue IV hydration  Elevated transaminitis History of hepatitis C -Repeat liver enzymes tomorrow. Recent Labs  Lab 01/04/20 0546 01/04/20 1651 01/06/20 0610  AST 130* 129* 167*  ALT 121* 121* 141*  ALKPHOS 63 60 50  BILITOT 1.0 1.1 1.1  PROT 9.2* 9.3* 7.9  ALBUMIN 5.0 4.8 4.0   Polysubstance abuse  -Counseled to quit.  Mobility: Encourage ambulation once mental status improves Code Status:   Code Status: Full Code  Nutritional status: Body mass index is 25.9 kg/m.     Diet Order            Diet  regular Room service appropriate? Yes; Fluid consistency: Thin  Diet effective now                 DVT prophylaxis: enoxaparin (LOVENOX) injection 40 mg Start: 01/05/20 2230 SCDs Start: 01/05/20 2217   Antimicrobials:  None Fluid: Normal saline at 75 mL/h. Consultants: Neurology Family Communication:  None at bedside  Status is:  Inpatient  Remains inpatient appropriate because:Altered mental status   Dispo: The patient is from: Home              Anticipated d/c is to: Home.  Also pending transfer to [facility] for long-term EEG monitoring              Anticipated d/c date is: 3 days              Patient currently is not medically stable to d/c.   Infusions:  . lacosamide (VIMPAT) IV 90 mL/hr at 01/07/20 1417  . levETIRAcetam      Scheduled Meds: . Chlorhexidine Gluconate Cloth  6 each Topical Daily  . enoxaparin (LOVENOX) injection  40 mg Subcutaneous Q24H  . mupirocin ointment  1 application Nasal BID    Antimicrobials: Anti-infectives (From admission, onward)   Start     Dose/Rate Route Frequency Ordered Stop   01/06/20 0900  cefTRIAXone (ROCEPHIN) 2 g in sodium chloride 0.9 % 100 mL IVPB  Status:  Discontinued        2 g 200 mL/hr over 30 Minutes Intravenous Every 12 hours 01/06/20 0105 01/06/20 2202   01/06/20 0800  vancomycin (VANCOCIN) IVPB 1000 mg/200 mL premix  Status:  Discontinued       "Followed by" Linked Group Details   1,000 mg 200 mL/hr over 60 Minutes Intravenous Every 12 hours 01/05/20 1904 01/06/20 2202   01/05/20 2000  vancomycin (VANCOREADY) IVPB 1500 mg/300 mL       "Followed by" Linked Group Details   1,500 mg 150 mL/hr over 120 Minutes Intravenous  Once 01/05/20 1904 01/06/20 0127   01/05/20 1830  cefTRIAXone (ROCEPHIN) 2 g in sodium chloride 0.9 % 100 mL IVPB        2 g 200 mL/hr over 30 Minutes Intravenous  Once 01/05/20 1829 01/05/20 2220      PRN meds:    Objective: Vitals:   01/07/20 1300 01/07/20 1400  BP: 117/66 (!) 119/56  Pulse:    Resp: 19 (!) 23  Temp:    SpO2:      Intake/Output Summary (Last 24 hours) at 01/07/2020 1440 Last data filed at 01/07/2020 1417 Gross per 24 hour  Intake 319.33 ml  Output 400 ml  Net -80.67 ml   Filed Weights   01/06/20 1251 01/06/20 1710 01/07/20 0500  Weight: 74.8 kg 71.2 kg 70.6 kg   Weight change:  Body mass  index is 25.9 kg/m.   Physical Exam: General exam: Awake, not in physical distress or pain.   Skin: No rashes, lesions or ulcers. HEENT: Atraumatic, normocephalic, supple neck, no obvious bleeding Lungs: Clear to auscultate bilaterally CVS: Regular rate and rhythm, no murmur GI/Abd soft, nontender, nondistended, bowel sound present CNS: awake, unable to follow commands or have a conversation.  Psychiatry: Normal mood Extremities: No pedal edema, no calf tenderness  Data Review: I have personally reviewed the laboratory data and studies available.  Recent Labs  Lab 01/04/20 0546 01/04/20 1651 01/06/20 0610 01/07/20 0312  WBC 4.6 7.4 4.0 4.0  NEUTROABS 2.5 4.8  --  1.1*  HGB  16.0* 16.0* 13.8 14.4  HCT 49.1* 47.7* 41.6 44.4  MCV 94.8 91.6 94.3 95.1  PLT 196 278 205 196   Recent Labs  Lab 01/04/20 0546 01/04/20 1651 01/05/20 1122 01/06/20 0610 01/07/20 0312  NA 140 143 142 141 137  K 3.8 3.5 3.3* 3.6 3.4*  CL 102 105 105 108 104  CO2 23 24 25  21* 22  GLUCOSE 102* 139* 105* 79 82  BUN 17 27* 31* 25* 15  CREATININE 0.82 1.52* 0.98 0.75 0.60  CALCIUM 9.9 10.3 9.2 8.7* 8.8*  MG  --   --   --   --  2.3    Signed, , MD [facility] 01/07/2020

## 2020-01-07 NOTE — Consult Note (Addendum)
[facility] Anita Grand A. Gerilyn Pilgrimoonquah, MD     [email or web address]          Anita Davis is an 34 y.o. female.   ASSESSMENT/PLAN: 1. ACUTE ENCEPHALOPATHY: Likely due to status epilepticus and direct effect of drug overdose due to psychostimulant.  There is improvement on EEG but she still had an electrographic seizure indicating that she still is likely in nonconvulsive status epilepticus.  A second agent of Vimpat will be given.  The dose of Keppra will be increased.  The patient will be given high-dose steroids in case she has steroid responsive encephalopathy due to autoimmune encephalitis especially presenting with status epilepticus.  EEG will be repeated tomorrow. Also, pyridoxyine will be given to are case of pyridoxine dependent status epilepticus.   2. Polysubstance drug abuse.    The nursing staff reports that she appears to been much calmer and resting this morning but this evening/afternoon she has become more agitated and is back in four-point restraints.  She has had 3 attempts to complete MRI but this cannot be done.  She had 1 with only the DWI scans done showing no acute ischemic changes.   GENERAL:  She is restless and currently in 4 point restraint.  HEENT:  Neck is supple no trauma noted.  Dentition is fair. Obvious caries are not noted.  ABDOMEN: soft  EXTREMITIES: No edema; several small bruises noted on the legs bilaterally   BACK:  Normal  SKIN: Normal by inspection.    MENTAL STATUS:  She is awake and alert. She occasionally follows midline commands but no other commands. She is essentially mute.  CRANIAL NERVES: Pupils are equal, round and reactive to light and accomodation; extra ocular movements are full, there is no significant nystagmus; visual fields are full; upper and lower facial muscles are normal in strength and symmetric, there is no flattening of the nasolabial folds; tongue is midline; uvula is midline; shoulder elevation is normal.  MOTOR:  Normal tone, bulk and strength; no pronator drift.  COORDINATION:  No tremors, myoclonus or dysmetria are noted. No parkinsonian features.  REFLEXES: Deep tendon reflexes are symmetrical and normal.   SENSATION: Normal to pain.       Blood pressure (!) 119/56, pulse 67, temperature 98.6 F (37 C), temperature source Axillary, resp. rate (!) 23, height 5\' 5"  (1.651 m), weight 70.6 kg, last menstrual period 12/12/2019, SpO2 98 %.  Past Medical History:  Diagnosis Date  . Allergy   . Bronchitis   . Hepatitis-C   . History of gestational diabetes   . HPV in female   . Substance abuse [facility](HCC)     Past Surgical History:  Procedure Laterality Date  . HEMORRHOID BANDING  age 34    Family History  Problem Relation Age of Onset  . Depression Mother   . Cervical cancer Mother   . Hypertension Father   . Diabetes Father     Social History:  reports that she has been smoking cigarettes and cigars. She has smoked for the past 24.00 years. She has never used smokeless tobacco. She reports previous drug use. Drugs: IV, Cocaine, Hydrocodone, Oxycodone, Marijuana, and Heroin. She reports that she does not drink alcohol.  Allergies: No Known Allergies  Medications: Prior to Admission medications   Medication Sig Start Date End Date Taking? Authorizing Provider  cephALEXin (KEFLEX) 500 MG capsule Take 500 mg by mouth 2 (two) times daily. For 5 days. 12/24/19   [provider]  cetirizine (ZYRTEC) 10 MG tablet  Take 10 mg by mouth daily.    [provider]  hydrOXYzine (ATARAX/VISTARIL) 25 MG tablet Take 1-2 tablets (25-50 mg total) by mouth every 6 (six) hours as needed for anxiety. Patient taking differently: Take 75 mg by mouth 5 (five) times daily.  12/31/19   Gilda Crease, MD  ibuprofen (ADVIL) 200 MG tablet Take 200 mg by mouth every 6 (six) hours as needed.    [provider]  METHADONE HCL PO Take 60 mg by mouth daily.     [provider]   ondansetron (ZOFRAN-ODT) 4 MG disintegrating tablet Take 4 mg by mouth 3 (three) times daily as needed for nausea or vomiting. Starting 12/24/2019 x 4 days. 12/24/19   [provider]  Polyethylene Glycol 3350 (MIRALAX PO) Take by mouth.    [provider]    Scheduled Meds: . Chlorhexidine Gluconate Cloth  6 each Topical Daily  . enoxaparin (LOVENOX) injection  40 mg Subcutaneous Q24H  . LORazepam  1 mg Intravenous 1 day or 1 dose  . mupirocin ointment  1 application Nasal BID   Continuous Infusions: . lacosamide (VIMPAT) IV 90 mL/hr at 01/07/20 1417  . levETIRAcetam    . methylPREDNISolone (SOLU-MEDROL) injection     PRN Meds:.     Results for orders placed or performed during the hospital encounter of 01/04/20 (from the past 48 hour(s))  Urinalysis, Routine w reflex microscopic Urine, Catheterized     Status: Abnormal   Collection Time: 01/05/20  4:32 PM  Result Value Ref Range   Color, Urine AMBER (A) YELLOW    Comment: BIOCHEMICALS MAY BE AFFECTED BY COLOR   APPearance HAZY (A) CLEAR   Specific Gravity, Urine 1.025 1.005 - 1.030   pH 5.0 5.0 - 8.0   Glucose, UA NEGATIVE NEGATIVE mg/dL   Hgb urine dipstick NEGATIVE NEGATIVE   Bilirubin Urine NEGATIVE NEGATIVE   Ketones, ur 5 (A) NEGATIVE mg/dL   Protein, ur 30 (A) NEGATIVE mg/dL   Nitrite NEGATIVE NEGATIVE   Leukocytes,Ua NEGATIVE NEGATIVE   RBC / HPF 0-5 0 - 5 RBC/hpf   WBC, UA 0-5 0 - 5 WBC/hpf   Bacteria, UA NONE SEEN NONE SEEN   Squamous Epithelial / LPF 0-5 0 - 5   Mucus PRESENT    Hyaline Casts, UA PRESENT     Comment: Performed at [facility], [address] [place], [place] [address]  Rapid urine drug screen (hospital performed)     Status: Abnormal   Collection Time: 01/05/20  4:33 PM  Result Value Ref Range   Opiates NONE DETECTED NONE DETECTED   Cocaine NONE DETECTED NONE DETECTED   Benzodiazepines POSITIVE (A) NONE DETECTED   Amphetamines NONE DETECTED NONE DETECTED    Tetrahydrocannabinol POSITIVE (A) NONE DETECTED   Barbiturates NONE DETECTED NONE DETECTED    Comment: (NOTE) DRUG SCREEN FOR MEDICAL PURPOSES ONLY.  IF CONFIRMATION IS NEEDED FOR ANY PURPOSE, NOTIFY LAB WITHIN 5 DAYS.  LOWEST DETECTABLE LIMITS FOR URINE DRUG SCREEN Drug Class                     Cutoff (ng/mL) Amphetamine and metabolites    1000 Barbiturate and metabolites    200 Benzodiazepine                 200 Tricyclics and metabolites     300 Opiates and metabolites        300 Cocaine and metabolites  300 THC                            50 Performed at [facility], [address], [place], [place] [address]   CSF cell count with differential     Status: Abnormal   Collection Time: 01/05/20  6:28 PM  Result Value Ref Range   Tube # 4    Color, CSF COLORLESS COLORLESS   Appearance, CSF CLEAR (A) CLEAR   RBC Count, CSF 0 0 /cu mm   WBC, CSF 1 0 - 5 /cu mm   Segmented Neutrophils-CSF TOO FEW TO COUNT, SMEAR AVAILABLE FOR REVIEW 0 - 6 %   Lymphs, CSF TOO FEW TO COUNT, SMEAR AVAILABLE FOR REVIEW 40 - 80 %   Monocyte-Macrophage-Spinal Fluid TOO FEW TO COUNT, SMEAR AVAILABLE FOR REVIEW 15 - 45 %   Eosinophils, CSF TOO FEW TO COUNT, SMEAR AVAILABLE FOR REVIEW 0 - 1 %    Comment: Performed at [facility], [address], [place], [place] [address]  Protein and glucose, CSF     Status: None   Collection Time: 01/05/20  6:28 PM  Result Value Ref Range   Glucose, CSF 53 40 - 70 mg/dL   Total  Protein, CSF 19 15 - 45 mg/dL    Comment: Performed at [facility], [address], [place], [place] [address]  CSF culture     Status: None (Preliminary result)   Collection Time: 01/05/20  6:28 PM   Specimen: Lumbar Puncture; Cerebrospinal Fluid  Result Value Ref Range   Specimen Description      LUMBAR Performed at [facility], [address] [place], [place] [address]    Special Requests      NONE Performed at [facility], [address] [place], [place] [address]     Culture      NO GROWTH < 24 HOURS Performed at [facility], [address] [place], [place] [address]    Report Status PENDING   HIV Antibody (routine testing w rflx)     Status: None   Collection Time: 01/05/20  9:15 PM  Result Value Ref Range   HIV Screen 4th Generation wRfx Non Reactive Non Reactive    Comment: Performed at [facility], [address] [place], [place] [address]  Blood culture (routine x 2)     Status: None (Preliminary result)   Collection Time: 01/05/20  9:15 PM   Specimen: BLOOD LEFT HAND  Result Value Ref Range   Specimen Description BLOOD LEFT HAND    Special Requests      BOTTLES DRAWN AEROBIC AND ANAEROBIC Blood Culture adequate volume   Culture      NO GROWTH < 12 HOURS Performed at [facility], [address], [place], [place] [address]    Report Status PENDING   Blood culture (routine x 2)     Status: None (Preliminary result)   Collection Time: 01/05/20  9:18 PM   Specimen: BLOOD LEFT ARM  Result Value Ref Range   Specimen Description BLOOD LEFT ARM    Special Requests      BOTTLES DRAWN AEROBIC AND ANAEROBIC Blood Culture adequate volume   Culture      NO GROWTH < 12 HOURS Performed at [facility], [address], [place], [place] [address]    Report Status PENDING   Gram stain     Status: None   Collection Time: 01/05/20  9:24 PM   Specimen: CSF  Result Value Ref Range   Specimen Description CSF    Special Requests NONE    Gram Stain      CYTOSPIN SMEAR NO WBC SEEN NO ORGANISMS SEEN Performed at [facility], [address], [place], [place] [address]    Report Status 01/05/2020 FINAL   Comprehensive metabolic panel     Status: Abnormal   Collection Time: 01/06/20  6:10 AM  Result Value Ref Range   Sodium 141 135 - 145 mmol/L   Potassium 3.6 3.5 - 5.1 mmol/L   Chloride 108 98 - 111 mmol/L   CO2 21 (L) 22 - 32 mmol/L   Glucose, Bld 79 70 - 99 mg/dL    Comment: Glucose reference range applies only to samples taken  after fasting for at least 8 hours.   BUN 25 (H) 6 - 20 mg/dL   Creatinine, Ser 6.04 0.44 - 1.00 mg/dL   Calcium 8.7 (L) 8.9 - 10.3 mg/dL   Total Protein 7.9 6.5 - 8.1 g/dL   Albumin 4.0 3.5 - 5.0 g/dL   AST 540 (H) 15 - 41 U/L   ALT 141 (H) 0 - 44 U/L   Alkaline Phosphatase 50 38 - 126 U/L   Total Bilirubin 1.1 0.3 - 1.2 mg/dL   GFR calc non Af Amer >60 >60 mL/min   GFR calc Af Amer >60 >60 mL/min   Anion gap 12 5 - 15    Comment: Performed at [facility], [address], [place], [place] [address]  CBC     Status: None   Collection Time: 01/06/20  6:10 AM  Result Value Ref Range   WBC 4.0 4.0 - 10.5 K/uL   RBC 4.41 3.87 - 5.11 MIL/uL   Hemoglobin 13.8 12.0 - 15.0 g/dL   HCT 14.7 36 - 46 %   MCV 94.3 80.0 - 100.0 fL   MCH 31.3 26.0 - 34.0 pg   MCHC 33.2 30.0 - 36.0 g/dL   RDW 82.9 56.2 - 13.0 %   Platelets 205 150 - 400 K/uL   nRBC 0.0 0.0 - 0.2 %    Comment: Performed at [facility], [address] [place], [place] [address]  Protime-INR     Status: None   Collection Time: 01/06/20  6:10 AM  Result Value Ref Range   Prothrombin Time 13.7 11.4 - 15.2 seconds   INR 1.1 0.8 - 1.2    Comment: (NOTE) INR goal varies based on device and disease states. Performed at [facility], [address] [place], [place] [address]   APTT     Status: None   Collection Time: 01/06/20  6:10 AM  Result Value Ref Range   aPTT 32 24 - 36 seconds    Comment: Performed at [facility], [address], [place], [place] [address]  MRSA PCR Screening     Status: Abnormal   Collection Time: 01/06/20  5:12 PM   Specimen: Nasal Mucosa; Nasopharyngeal  Result Value Ref Range   MRSA by PCR POSITIVE (A) NEGATIVE    Comment: TETREAULT,H  BY HUFFINES,S 9.16.2021         The GeneXpert MRSA Assay (FDA approved for NASAL specimens only), is one component of a comprehensive MRSA colonization surveillance program. It is not intended to diagnose MRSA infection nor to guide or monitor treatment  for MRSA infections. Performed at [facility], [address] [place], [place] [address] CORRECTED ON 09/16 AT 0654: PREVIOUSLY REPORTED AS  POSITIVE        The GeneXpert MRSA Assay (FDA approved for NASAL specimens only), is one component of a comprehensive MRSA colonization surveillance program. It is not intended to diagnose MRSA infection  nor to guide or monitor treatment for MRSA infections. RESULT CALLED TO, READ BACK BY AND VERIFIED WITH: TETREAULT,H   CBC with Differential/Platelet     Status: Abnormal   Collection Time: 01/07/20  3:12 AM  Result Value Ref Range   WBC 4.0 4.0 - 10.5 K/uL   RBC 4.67 3.87 - 5.11 MIL/uL   Hemoglobin 14.4 12.0 - 15.0 g/dL   HCT 71.2 36 - 46 %   MCV 95.1 80.0 - 100.0 fL   MCH 30.8 26.0 - 34.0 pg   MCHC 32.4 30.0 - 36.0 g/dL   RDW 45.8 09.9 - 83.3 %   Platelets 196 150 - 400 K/uL   nRBC 0.0 0.0 - 0.2 %   Neutrophils Relative % 26 %   Neutro Abs 1.1 (L) 1.7 - 7.7 K/uL   Lymphocytes Relative 63 %   Lymphs Abs 2.6 0.7 - 4.0 K/uL   Monocytes Relative 9 %   Monocytes Absolute 0.4 0 - 1 K/uL   Eosinophils Relative 0 %   Eosinophils Absolute 0.0 0 - 0 K/uL   Basophils Relative 1 %   Basophils Absolute 0.0 0 - 0 K/uL   Immature Granulocytes 1 %   Abs Immature Granulocytes 0.02 0.00 - 0.07 K/uL    Comment: Performed at [facility], [address], [place], [place] [address]  Basic metabolic panel     Status: Abnormal   Collection Time: 01/07/20  3:12 AM  Result Value Ref Range   Sodium 137 135 - 145 mmol/L   Potassium 3.4 (L) 3.5 - 5.1 mmol/L   Chloride 104 98 - 111 mmol/L   CO2 22 22 - 32 mmol/L   Glucose, Bld 82 70 - 99 mg/dL    Comment: Glucose reference range applies only to samples taken after fasting for at least 8 hours.   BUN 15 6 - 20 mg/dL   Creatinine, Ser 3.97 0.44 - 1.00 mg/dL   Calcium 8.8 (L) 8.9 - 10.3 mg/dL   GFR calc non Af Amer >60 >60 mL/min   GFR calc Af Amer >60 >60 mL/min   Anion gap 11 5 - 15    Comment: Performed at  [facility], [address], [place], [place] [address]  Magnesium     Status: None   Collection Time: 01/07/20  3:12 AM  Result Value Ref Range   Magnesium 2.3 1.7 - 2.4 mg/dL    Comment: Performed at [facility], [address], [place], [place] [address]    Studies/Results:  EEG Description: No posterior dominant rhythm was seen. EEG showed frequent seizures without clinical signs arising from left centrotemporal region. EEG showed 5-6hz  spike and wave as well as theta slowing in left centrotemporal region which then spread to involve all left hemisphere followed by right hemisphere and evolved to 1-2hz  delta activity.  Generalized 2-3hz  delta slowing with overriding15-18hz  beta activity was also noted. Hyperventilation and photic stimulation were not performed.      ABNORMALITY - Focal non convulsive status epilepticus, left centro-temporal region - Continuous slow, generalized - Excessive beta, generalized   IMPRESSION: This study showed focal non convulsive status epilepticus arising from left centrotemporal region. Additionally, there is severe diffuse encephalopathy, non specific etiology but likely related to seizures     LIMITED  MRI BRAIN  FINDINGS: Brain: Motion degraded study. Axial diffusion imaging only obtained. No sign of acute infarction or other restricted diffusion lesion. No sign of hydrocephalus, mass, hemorrhage or extra-axial collection.   Vascular: Major vessels at the base of the brain show flow.   Skull and upper cervical spine: No abnormality seen.   Sinuses/Orbits: No abnormality seen.   Other: None   IMPRESSION: Motion degraded study. Axial diffusion imaging only obtained. No abnormality seen to explain the clinical presentation.    Limited brain MRI shows no acute findings on DWI.    EEG is also personally reviewed and shows multiple episodes of generalized spike slow wave activity with a maximum in the left central temporal region. There is  associated general delta waves after these episodes of spike slow wave activity.    This is a critical patient in status epilepticus.  Multiple discussions were done with the hospitalist and nursing staff in care of this patient.  Critical care time 60 minutes.  Gorje Iyer A. Gerilyn Pilgrim, M.D.  Diplomate, Biomedical engineer of Psychiatry and Neurology ( Neurology). 01/07/2020, 4:28 PM

## 2020-01-08 ENCOUNTER — Inpatient Hospital Stay (HOSPITAL_COMMUNITY)
Admit: 2020-01-08 | Discharge: 2020-01-08 | Disposition: A | Discharge status: Home / Self Care | Payer: Medicaid Other | Attending: Neurology | Admitting: Neurology

## 2020-01-08 LAB — COMPREHENSIVE METABOLIC PANEL
ALT: 163 U/L — ABNORMAL HIGH (ref 0–44)
AST: 126 U/L — ABNORMAL HIGH (ref 15–41)
Albumin: 4.1 g/dL (ref 3.5–5.0)
Alkaline Phosphatase: 51 U/L (ref 38–126)
Anion gap: 14 (ref 5–15)
BUN: 22 mg/dL — ABNORMAL HIGH (ref 6–20)
CO2: 21 mmol/L — ABNORMAL LOW (ref 22–32)
Calcium: 9.2 mg/dL (ref 8.9–10.3)
Chloride: 104 mmol/L (ref 98–111)
Creatinine, Ser: 0.72 mg/dL (ref 0.44–1.00)
GFR calc Af Amer: >60 mL/min (ref >60)
GFR calc non Af Amer: >60 mL/min (ref >60)
Glucose, Bld: 162 mg/dL — ABNORMAL HIGH (ref 70–99)
Potassium: 3.6 mmol/L (ref 3.5–5.1)
Sodium: 139 mmol/L (ref 135–145)
Total Bilirubin: 1.1 mg/dL (ref 0.3–1.2)
Total Protein: 8.5 g/dL — ABNORMAL HIGH (ref 6.5–8.1)

## 2020-01-08 LAB — C-REACTIVE PROTEIN: CRP: 0.5 mg/dL (ref <1.0)

## 2020-01-08 LAB — CBC WITH DIFFERENTIAL/PLATELET
Basophils Absolute: 0 10*3/uL (ref 0.0–0.1)
Basophils Relative: 0 %
Eosinophils Absolute: 0 10*3/uL (ref 0.0–0.5)
Eosinophils Relative: 0 %
HCT: 44.6 % (ref 36.0–46.0)
Hemoglobin: 15.5 g/dL — ABNORMAL HIGH (ref 12.0–15.0)
Lymphocytes Relative: 55 %
Lymphs Abs: 1.1 10*3/uL (ref 0.7–4.0)
MCH: 31 pg (ref 26.0–34.0)
MCHC: 34.8 g/dL (ref 30.0–36.0)
MCV: 89.2 fL (ref 80.0–100.0)
Monocytes Absolute: 0 10*3/uL — ABNORMAL LOW (ref 0.1–1.0)
Monocytes Relative: 0 %
Neutro Abs: 0.9 10*3/uL — ABNORMAL LOW (ref 1.7–7.7)
Neutrophils Relative %: 45 %
Platelets: 223 10*3/uL (ref 150–400)
RBC: 5 MIL/uL (ref 3.87–5.11)
RDW: 11.8 % (ref 11.5–15.5)
WBC: 2 10*3/uL — ABNORMAL LOW (ref 4.0–10.5)
nRBC: 0 % (ref 0.0–0.2)

## 2020-01-08 LAB — GLUCOSE, CAPILLARY: Glucose-Capillary: 135 mg/dL — ABNORMAL HIGH (ref 70–99)

## 2020-01-08 LAB — SEDIMENTATION RATE: Sed Rate: 15 mm/hr (ref 0–22)

## 2020-01-08 LAB — RPR: RPR Ser Ql: NONREACTIVE

## 2020-01-08 MED ORDER — SODIUM CHLORIDE 0.9 % IV SOLN
1000.0000 mg | Freq: Every day | INTRAVENOUS | Status: AC
  Administered 2020-01-09: 1000 mg via INTRAVENOUS
  Filled 2020-01-08 (×2): qty 8

## 2020-01-08 MED ORDER — ORAL CARE MOUTH RINSE
15.0000 mL | Freq: Two times a day (BID) | OROMUCOSAL | Status: DC
  Administered 2020-01-09 – 2020-02-04 (×52): 15 mL via OROMUCOSAL

## 2020-01-08 MED ORDER — SODIUM CHLORIDE 0.9 % IV SOLN: Freq: Once | INTRAVENOUS | Status: AC

## 2020-01-08 NOTE — Progress Notes (Signed)
PROGRESS NOTE  Anita Davis  DOB: 11/13/1985  PCP: [name], PA-C KLK:[id number]  DOA: 01/04/2020  LOS: 2 days   Chief Complaint  Patient presents with  . Medical Clearance    Brief narrative: Patient is a 34 y.o. female with medical history significant for hepatitis C and polysubstance abuse who was brought to the ED by EMS for altered mental status.    9/13, patient was brought to the ED around 3:20 AM by family member due to disorientation which was suspected to be due to substance abuse (methamphetamine).  Later in the morning, she was more alert and was hence discharged.   Later in the day 9/13, local police was called to a local business when they found the patient lying down in the parking lot.  She was brought to the ED for further evaluation management.  Patient states that she had methamphetamine in her pants, however, this was not found when she was searched.    Work-up in the ED included labs, urinalysis, UDS, MRI.  UDS positive for benzodiazepine and THC, negative for amphetamine.  Work-up unremarkable otherwise. She remained altered in the ED for several hours and received intermittent doses of IV Ativan to control agitation.  After about 24 hours in the ED, ED attending reached out to neurologist at: Dr. Amada Jupiter. Because of patient's persistent altered mentation without clear cause, further work-up was recommended. Patient underwent lumbar puncture and was initiated on empiric antibiotics. Patient was admitted to hospitalist service for further evaluation management. Neurology consultation was obtained. 9/15, EEG was obtained which showed that the patient is in status epilepticus.  She was transferred to ICU and started on Keppra with high loading dose.  Subjective: Patient was seen and examined this morning. Awake, grimacing.  Unable to follow command.  Unable to answer questions Remains in restraints.  Assessment/Plan: Acute metabolic encephalopathy   Status epilepticus -Patient had altered mental status mental status at presentation.  No convulsions or incontinence was reported. -With EEG report, patient was noted to have underlying status epilepticus, likely is a direct effect of drug overdose due to psychostimulant. -Neurology consulted and appreciated.  Patient is currently on IV Keppra, IV Vimpat, IV Solu-Medrol and as needed Ativan -Patient also has an active order for transfer to [facility] for long-term EEG for long-term EEG monitoring.  But because of bed not being available, patient is being managed in ICU at [facility].  -MRI brain normal.  Lumbar puncture normal.  Urine drug screen negative for methamphetamine or cocaine. -Continue fall precaution and neuro checks -Continue one-to-one observation safety precaution -Appetite is poor.  Continue IV fluids.  Hypokalemia -Continue to monitor. Recent Labs  Lab 01/04/20 1651 01/05/20 1122 01/06/20 0610 01/07/20 0312 01/08/20 0622  K 3.5 3.3* 3.6 3.4* 3.6  MG  --   --   --  2.3  --    Elevated transaminitis History of hepatitis C -Repeat liver enzymes tomorrow. Recent Labs  Lab 01/04/20 0546 01/04/20 1651 01/06/20 0610 01/08/20 0622  AST 130* 129* 167* 126*  ALT 121* 121* 141* 163*  ALKPHOS 63 60 50 51  BILITOT 1.0 1.1 1.1 1.1  PROT 9.2* 9.3* 7.9 8.5*  ALBUMIN 5.0 4.8 4.0 4.1   Polysubstance abuse  -Counseled to quit.  Mobility: Encourage ambulation once mental status improves Code Status:   Code Status: Full Code  Nutritional status: Body mass index is 25.9 kg/m.     Diet Order  Diet regular Room service appropriate? Yes; Fluid consistency: Thin  Diet effective now               But has poor appetite  DVT prophylaxis: enoxaparin (LOVENOX) injection 40 mg Start: 01/05/20 2230 SCDs Start: 01/05/20 2217   Antimicrobials:  None Fluid: Normal saline at 75 mL/h. Consultants: Neurology Family Communication:  None at bedside  Status is:  Inpatient  Remains inpatient appropriate because:Altered mental status   Dispo: The patient is from: Home              Anticipated d/c is to: Home.  Also pending transfer to [facility] for long-term EEG monitoring              Anticipated d/c date is: 3 days              Patient currently is not medically stable to d/c.   Infusions:  . lacosamide (VIMPAT) IV 200 mg (01/08/20 1010)  . levETIRAcetam 1,500 mg (01/08/20 0804)  . methylPREDNISolone (SOLU-MEDROL) injection Stopped (01/07/20 1821)    Scheduled Meds: . Chlorhexidine Gluconate Cloth  6 each Topical Daily  . enoxaparin (LOVENOX) injection  40 mg Subcutaneous Q24H  . LORazepam  1 mg Intravenous 1 day or 1 dose  . mupirocin ointment  1 application Nasal BID  . pyridOXINE  100 mg Intravenous Daily    Antimicrobials: Anti-infectives (From admission, onward)   Start     Dose/Rate Route Frequency Ordered Stop   01/06/20 0900  cefTRIAXone (ROCEPHIN) 2 g in sodium chloride 0.9 % 100 mL IVPB  Status:  Discontinued        2 g 200 mL/hr over 30 Minutes Intravenous Every 12 hours 01/06/20 0105 01/06/20 2202   01/06/20 0800  vancomycin (VANCOCIN) IVPB 1000 mg/200 mL premix  Status:  Discontinued       "Followed by" Linked Group Details   1,000 mg 200 mL/hr over 60 Minutes Intravenous Every 12 hours 01/05/20 1904 01/06/20 2202   01/05/20 2000  vancomycin (VANCOREADY) IVPB 1500 mg/300 mL       "Followed by" Linked Group Details   1,500 mg 150 mL/hr over 120 Minutes Intravenous  Once 01/05/20 1904 01/06/20 0127   01/05/20 1830  cefTRIAXone (ROCEPHIN) 2 g in sodium chloride 0.9 % 100 mL IVPB        2 g 200 mL/hr over 30 Minutes Intravenous  Once 01/05/20 1829 01/05/20 2220      PRN meds:    Objective: Vitals:   01/08/20 1200 01/08/20 1300  BP: 131/81 (!) 157/87  Pulse: 69 69  Resp: 16 (!) 9  Temp:    SpO2: 97% 98%    Intake/Output Summary (Last 24 hours) at 01/08/2020 1358 Last data filed at 01/08/2020 0545 Gross per  24 hour  Intake 248 ml  Output 2550 ml  Net -2302 ml   Filed Weights   01/06/20 1251 01/06/20 1710 01/07/20 0500  Weight: 74.8 kg 71.2 kg 70.6 kg   Weight change:  Body mass index is 25.9 kg/m.   Physical Exam: General exam: Awake, not in physical distress or pain.   Skin: No rashes, lesions or ulcers. HEENT: Atraumatic, normocephalic, supple neck, no obvious bleeding Lungs: Clear to auscultation bilaterally CVS: Regular rate and rhythm, no murmur GI/Abd soft, nontender, nondistended, bowel sound present CNS: awake, unable to follow commands or have a conversation.  Psychiatry: Normal mood Extremities: No pedal edema, no calf tenderness  Data Review: I have personally reviewed the laboratory  data and studies available.  Recent Labs  Lab 01/04/20 0546 01/04/20 1651 01/06/20 0610 01/07/20 0312 01/08/20 0622  WBC 4.6 7.4 4.0 4.0 2.0*  NEUTROABS 2.5 4.8  --  1.1* 0.9*  HGB 16.0* 16.0* 13.8 14.4 15.5*  HCT 49.1* 47.7* 41.6 44.4 44.6  MCV 94.8 91.6 94.3 95.1 89.2  PLT 196 278 205 196 223   Recent Labs  Lab 01/04/20 1651 01/05/20 1122 01/06/20 0610 01/07/20 0312 01/08/20 0622  NA 143 142 141 137 139  K 3.5 3.3* 3.6 3.4* 3.6  CL 105 105 108 104 104  CO2 24 25 21* 22 21*  GLUCOSE 139* 105* 79 82 162*  BUN 27* 31* 25* 15 22*  CREATININE 1.52* 0.98 0.75 0.60 0.72  CALCIUM 10.3 9.2 8.7* 8.8* 9.2  MG  --   --   --  2.3  --     Signed, Lorin Glass, MD [facility] 01/08/2020

## 2020-01-08 NOTE — Progress Notes (Signed)
Bath and pericare provided. Patient found to be retaining approximately 950 in bladder per bladder scan. I&O performed and 650 output. Continue to monitor. Purewick replaced. Pad replaced as patient is on her menstrual cycle.

## 2020-01-08 NOTE — Progress Notes (Signed)
Night shift RN shared with me that the patient's apparent roommate Lollie Sails left $40 with nursing staff when he was visiting the patient. Unsure if it was last night or when the exact time was, but it is also unsure if it was for the patient or as a gift for nursing staff? Security called and money placed with them in safe until patient is transported to [facility].

## 2020-01-08 NOTE — Procedures (Signed)
  [facility] Anita Drone A. Gerilyn Pilgrim, MD     [email or web address]           HISTORY: This is a 34 year old female who has been in status epilepticus.  This is a repeat EEG to assess treatment as she continues to be encephalopathic.  MEDICATIONS:  Current Facility-Administered Medications:  .  Chlorhexidine Gluconate Cloth 2 % PADS 6 each, 6 each, Topical, Daily, Dahal, Melina Schools, MD, 6 each at 01/08/20 0848 .  enoxaparin (LOVENOX) injection 40 mg, 40 mg, Subcutaneous, Q24H, Adefeso, Oladapo, DO, 40 mg at 01/07/20 2130 .  lacosamide (VIMPAT) 200 mg in sodium chloride 0.9 % 25 mL IVPB, 200 mg, Intravenous, Q12H, Matilde Markie, MD, Last Rate: 90 mL/hr at 01/08/20 1010, 200 mg at 01/08/20 1010 .  levETIRAcetam (KEPPRA) IVPB 1500 mg/ 100 mL premix, 1,500 mg, Intravenous, Q12H, Lakeisha Waldrop, MD, Last Rate: 400 mL/hr at 01/08/20 0804, 1,500 mg at 01/08/20 0804 .  LORazepam (ATIVAN) tablet 1 mg, 1 mg, Oral, Q6H PRN **OR** LORazepam (ATIVAN) injection 1 mg, 1 mg, Intramuscular, Q6H PRN, Gerilyn Davis, Kimmora Risenhoover, MD .  methylPREDNISolone sodium succinate (SOLU-MEDROL) 1,000 mg in sodium chloride 0.9 % 50 mL IVPB, 1,000 mg, Intravenous, Daily, Beryle Beams, MD, Stopped at 01/07/20 1821 .  mupirocin ointment (BACTROBAN) 2 % 1 application, 1 application, Nasal, BID, Dahal, Binaya, MD, 1 application at 01/08/20 0848 .  pyridOXINE (B-6) injection 100 mg, 100 mg, Intravenous, Daily, Annamay Laymon, MD, 100 mg at 01/08/20 0848     ANALYSIS: A 16 channel recording using standard 10 20 measurements is conducted for 44 minutes.  There is a posterior dominant rhythm of 12 Hz which attenuates with eye opening.  There is significant myogenic and movement artifact seen throughout the recording especially on the right side.  Photic stimulation and hyperventilation are not conducted.  The patient had various episodes of arching of the trunk and movements without electrographic correlates.  There is one episode of  generalized delta activity in a semirhythmic pattern.  This was associated with the spindles afterwards and the patient being unresponsive but no clear epileptiform discharges are noted.   IMPRESSION: 1.  This study shows an episode of generalized slow-wave delta activity without clear associated epileptiform discharges.  No evidence of epileptiform discharges are noted.  The recording is significantly improved compared to the 2 previous studies.      Jalan Fariss A. Gerilyn Davis, M.D.  Diplomate, Biomedical engineer of Psychiatry and Neurology ( Neurology).

## 2020-01-08 NOTE — Progress Notes (Signed)
EEG completed, results pending. 

## 2020-01-08 NOTE — Progress Notes (Signed)
[facility] Anita Davis A. Gerilyn Pilgrim, MD     [email or web address]          Anita Davis is an 34 y.o. female.   ASSESSMENT/PLAN: 1. ACUTE ENCEPHALOPATHY: Likely due to status epilepticus.  She is also expected to of having possible effect of psychoactive medication especially psychostimulant although this has not been documented clearly.  Toxicology has been negative.  Repeat EEG done today shows significant improvement without any clear epileptiform discharges although there is an episode of generalized slowing.  For now we will continue with both antiepileptic medications.  The Ativan will be given on a as needed basis.  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  Additional labs are also obtained.  She is on pyridoxine which will also be continued.  Transfer to [facility] is pending. 2. Polysubstance drug abuse.    She seems to be more calmer today.  EEG is clearly improved although she appears to be still not talking/mute and the relatively unresponsive.  There is no history is obtained from the patient's roommate [name].  He was very helpful in providing a clear picture to the patient's symptoms.  It appears that the patient developed symptoms a week ago on Thursday last when she just did not feel right.  She was seen in the emergency room and went back the following day because she reports again not feeling well.  He reports that she was quite paranoid.  However, he took her to the hospital the following Monday because she thought she had a seizure.  She reportedly found herself on the ground.  She also reported that he did notice her having a spell where she fell to the ground but it was not associated with any type of convulsions.  He does report a history of CBD use, marijuana use and methadone.  She is on chronic methadone therapy through the clinic.  On presented to the hospital this Monday, she apparently became mute and has not spoken  since then.   GENERAL:  She is restless and currently in 4 point restraint.  HEENT:  Neck is supple no trauma noted.  Dentition is fair. Obvious caries are not noted.  ABDOMEN: soft  EXTREMITIES: No edema; several small bruises noted on the legs bilaterally   BACK:  Normal  SKIN: Normal by inspection.    MENTAL STATUS:  She is awake and alert. She occasionally follows midline commands but no other commands. She is essentially mute.  CRANIAL NERVES: Pupils are equal, round and reactive to light and accomodation; extra ocular movements are full, there is no significant nystagmus; visual fields are full; upper and lower facial muscles are normal in strength and symmetric, there is no flattening of the nasolabial folds; tongue is midline; uvula is midline; shoulder elevation is normal.  MOTOR: Normal tone, bulk and strength; no pronator drift.  COORDINATION:  No tremors, myoclonus or dysmetria are noted. No parkinsonian features.  REFLEXES: Deep tendon reflexes are symmetrical and normal.   SENSATION: Normal to pain.       Blood pressure 139/83, pulse 86, temperature 98.2 F (36.8 C), temperature source Oral, resp. rate 14, height 5\' 5"  (1.651 m), weight 70.6 kg, last menstrual period 12/12/2019, SpO2 97 %.  Past Medical History:  Diagnosis Date  . Allergy   . Bronchitis   . Hepatitis-C   . History of gestational diabetes   . HPV in female   . Substance abuse (HCC)  Past Surgical History:  Procedure Laterality Date  . HEMORRHOID BANDING  age 60    Family History  Problem Relation Age of Onset  . Depression Mother   . Cervical cancer Mother   . Hypertension Father   . Diabetes Father     Social History:  reports that she has been smoking cigarettes and cigars. She has smoked for the past 24.00 years. She has never used smokeless tobacco. She reports previous drug use. Drugs: IV, Cocaine, Hydrocodone, Oxycodone, Marijuana, and Heroin. She reports that she does  not drink alcohol.  Allergies: No Known Allergies  Medications: Prior to Admission medications   Medication Sig Start Date End Date Taking? Authorizing Provider  cephALEXin (KEFLEX) 500 MG capsule Take 500 mg by mouth 2 (two) times daily. For 5 days. 12/24/19   [provider]  cetirizine (ZYRTEC) 10 MG tablet Take 10 mg by mouth daily.    [provider]  hydrOXYzine (ATARAX/VISTARIL) 25 MG tablet Take 1-2 tablets (25-50 mg total) by mouth every 6 (six) hours as needed for anxiety. Patient taking differently: Take 75 mg by mouth 5 (five) times daily.  12/31/19   Gilda Crease, MD  ibuprofen (ADVIL) 200 MG tablet Take 200 mg by mouth every 6 (six) hours as needed.    [provider]  METHADONE HCL PO Take 60 mg by mouth daily.     [provider]  ondansetron (ZOFRAN-ODT) 4 MG disintegrating tablet Take 4 mg by mouth 3 (three) times daily as needed for nausea or vomiting. Starting 12/24/2019 x 4 days. 12/24/19   [provider]  Polyethylene Glycol 3350 (MIRALAX PO) Take by mouth.    [provider]    Scheduled Meds: . Chlorhexidine Gluconate Cloth  6 each Topical Daily  . enoxaparin (LOVENOX) injection  40 mg Subcutaneous Q24H  . mupirocin ointment  1 application Nasal BID  . pyridOXINE  100 mg Intravenous Daily   Continuous Infusions: . lacosamide (VIMPAT) IV 200 mg (01/08/20 1010)  . levETIRAcetam 1,500 mg (01/08/20 0804)  . methylPREDNISolone (SOLU-MEDROL) injection Stopped (01/07/20 1821)   PRN Meds:.     Results for orders placed or performed during the hospital encounter of 01/04/20 (from the past 48 hour(s))  MRSA PCR Screening     Status: Abnormal   Collection Time: 01/06/20  5:12 PM   Specimen: Nasal Mucosa; Nasopharyngeal  Result Value Ref Range   MRSA by PCR POSITIVE (A) NEGATIVE    Comment: TETREAULT,H  BY HUFFINES,S 9.16.2021         The GeneXpert MRSA Assay (FDA approved for NASAL specimens only),  is one component of a comprehensive MRSA colonization surveillance program. It is not intended to diagnose MRSA infection nor to guide or monitor treatment for MRSA infections. Performed at [facility], [address] [place], [place] [address] CORRECTED ON 09/16 AT 0654: PREVIOUSLY REPORTED AS POSITIVE        The GeneXpert MRSA Assay (FDA approved for NASAL specimens only), is one component of a comprehensive MRSA colonization surveillance program. It is not intended to diagnose MRSA infection  nor to guide or monitor treatment for MRSA infections. RESULT CALLED TO, READ BACK BY AND VERIFIED WITH: TETREAULT,H   CBC with Differential/Platelet     Status: Abnormal   Collection Time: 01/07/20  3:12 AM  Result Value Ref Range   WBC 4.0 4.0 - 10.5 K/uL   RBC 4.67 3.87 - 5.11 MIL/uL   Hemoglobin 14.4 12.0 - 15.0 g/dL  HCT 44.4 36 - 46 %   MCV 95.1 80.0 - 100.0 fL   MCH 30.8 26.0 - 34.0 pg   MCHC 32.4 30.0 - 36.0 g/dL   RDW 16.112.1 09.611.5 - 04.515.5 %   Platelets 196 150 - 400 K/uL   nRBC 0.0 0.0 - 0.2 %   Neutrophils Relative % 26 %   Neutro Abs 1.1 (L) 1.7 - 7.7 K/uL   Lymphocytes Relative 63 %   Lymphs Abs 2.6 0.7 - 4.0 K/uL   Monocytes Relative 9 %   Monocytes Absolute 0.4 0 - 1 K/uL   Eosinophils Relative 0 %   Eosinophils Absolute 0.0 0 - 0 K/uL   Basophils Relative 1 %   Basophils Absolute 0.0 0 - 0 K/uL   Immature Granulocytes 1 %   Abs Immature Granulocytes 0.02 0.00 - 0.07 K/uL    Comment: Performed at [facility], [address] [place], [place] [address]  Basic metabolic panel     Status: Abnormal   Collection Time: 01/07/20  3:12 AM  Result Value Ref Range   Sodium 137 135 - 145 mmol/L   Potassium 3.4 (L) 3.5 - 5.1 mmol/L   Chloride 104 98 - 111 mmol/L   CO2 22 22 - 32 mmol/L   Glucose, Bld 82 70 - 99 mg/dL    Comment: Glucose reference range applies only to samples taken after fasting for at least 8 hours.   BUN 15 6 - 20 mg/dL   Creatinine, Ser 1.910.60 0.44 - 1.00 mg/dL    Calcium 8.8 (L) 8.9 - 10.3 mg/dL   GFR calc non Af Amer >60 >60 mL/min   GFR calc Af Amer >60 >60 mL/min   Anion gap 11 5 - 15    Comment: Performed at [facility], [address] [place], [place] [address]  Magnesium     Status: None   Collection Time: 01/07/20  3:12 AM  Result Value Ref Range   Magnesium 2.3 1.7 - 2.4 mg/dL    Comment: Performed at [facility], [address] [place], [place] [address]  Vitamin B12     Status: Abnormal   Collection Time: 01/07/20  3:12 AM  Result Value Ref Range   Vitamin B-12 1,488 (H) 180 - 914 pg/mL    Comment: (NOTE) This assay is not validated for testing neonatal or myeloproliferative syndrome specimens for Vitamin B12 levels. Performed at [facility], [address] [place], [place] [address]   TSH     Status: None   Collection Time: 01/07/20  3:12 AM  Result Value Ref Range   TSH 0.955 0.350 - 4.500 uIU/mL    Comment: Performed by a 3rd Generation assay with a functional sensitivity of <=0.01 uIU/mL. Performed at [facility], [address] [place], [place] [address]   Glucose, capillary     Status: None   Collection Time: 01/07/20  4:36 PM  Result Value Ref Range   Glucose-Capillary 78 70 - 99 mg/dL    Comment: Glucose reference range applies only to samples taken after fasting for at least 8 hours.  RPR     Status: None   Collection Time: 01/07/20  5:58 PM  Result Value Ref Range   RPR Ser Ql NON REACTIVE NON REACTIVE    Comment: Performed at [facility], [address] [place], [place] [address]  Glucose, capillary     Status: Abnormal   Collection Time: 01/08/20  2:52 AM  Result Value Ref Range  Glucose-Capillary 135 (H) 70 - 99 mg/dL    Comment: Glucose reference range applies only to samples taken after fasting for at least 8 hours.  CBC with Differential/Platelet     Status: Abnormal   Collection Time: 01/08/20  6:22 AM  Result Value Ref Range   WBC 2.0 (L) 4.0 - 10.5 K/uL   RBC 5.00 3.87 - 5.11 MIL/uL    Hemoglobin 15.5 (H) 12.0 - 15.0 g/dL   HCT 06.2 36 - 46 %   MCV 89.2 80.0 - 100.0 fL   MCH 31.0 26.0 - 34.0 pg   MCHC 34.8 30.0 - 36.0 g/dL   RDW 69.4 85.4 - 62.7 %   Platelets 223 150 - 400 K/uL   nRBC 0.0 0.0 - 0.2 %   Neutrophils Relative % 45 %   Neutro Abs 0.9 (L) 1.7 - 7.7 K/uL   Lymphocytes Relative 55 %   Lymphs Abs 1.1 0.7 - 4.0 K/uL   Monocytes Relative 0 %   Monocytes Absolute 0.0 (L) 0 - 1 K/uL   Eosinophils Relative 0 %   Eosinophils Absolute 0.0 0 - 0 K/uL   Basophils Relative 0 %   Basophils Absolute 0.0 0 - 0 K/uL   Reactive, Benign Lymphocytes PRESENT     Comment: Performed at [facility], [address] [place], [place] [address]  Comprehensive metabolic panel     Status: Abnormal   Collection Time: 01/08/20  6:22 AM  Result Value Ref Range   Sodium 139 135 - 145 mmol/L   Potassium 3.6 3.5 - 5.1 mmol/L   Chloride 104 98 - 111 mmol/L   CO2 21 (L) 22 - 32 mmol/L   Glucose, Bld 162 (H) 70 - 99 mg/dL    Comment: Glucose reference range applies only to samples taken after fasting for at least 8 hours.   BUN 22 (H) 6 - 20 mg/dL   Creatinine, Ser 9.38 0.44 - 1.00 mg/dL   Calcium 9.2 8.9 - 18.2 mg/dL   Total Protein 8.5 (H) 6.5 - 8.1 g/dL   Albumin 4.1 3.5 - 5.0 g/dL   AST 993 (H) 15 - 41 U/L   ALT 163 (H) 0 - 44 U/L   Alkaline Phosphatase 51 38 - 126 U/L   Total Bilirubin 1.1 0.3 - 1.2 mg/dL   GFR calc non Af Amer >60 >60 mL/min   GFR calc Af Amer >60 >60 mL/min   Anion gap 14 5 - 15    Comment: Performed at [facility], [address], [place], [place] [address]    Studies/Results:  EEG Description: No posterior dominant rhythm was seen. EEG showed frequent seizures without clinical signs arising from left centrotemporal region. EEG showed 5-6hz  spike and wave as well as theta slowing in left centrotemporal region which then spread to involve all left hemisphere followed by right hemisphere and evolved to 1-2hz  delta activity.  Generalized 2-3hz  delta  slowing with overriding15-18hz  beta activity was also noted. Hyperventilation and photic stimulation were not performed.      ABNORMALITY - Focal non convulsive status epilepticus, left centro-temporal region - Continuous slow, generalized - Excessive beta, generalized   IMPRESSION: This study showed focal non convulsive status epilepticus arising from left centrotemporal region. Additionally, there is severe diffuse encephalopathy, non specific etiology but likely related to seizures     LIMITED MRI BRAIN  FINDINGS: Brain: Motion degraded study. Axial diffusion imaging only obtained. No sign of acute infarction or other restricted diffusion lesion. No sign  of hydrocephalus, mass, hemorrhage or extra-axial collection.   Vascular: Major vessels at the base of the brain show flow.   Skull and upper cervical spine: No abnormality seen.   Sinuses/Orbits: No abnormality seen.   Other: None   IMPRESSION: Motion degraded study. Axial diffusion imaging only obtained. No abnormality seen to explain the clinical presentation.    Limited brain MRI shows no acute findings on DWI.    EEG is also personally reviewed and shows multiple episodes of generalized spike slow wave activity with a maximum in the left central temporal region. There is associated general delta waves after these episodes of spike slow wave activity.    This is a critical patient in status epilepticus.  Multiple discussions were done with the hospitalist, family, friends and nursing staff in care of this patient.  Critical care time 60 minutes.  Janijah Symons A. Gerilyn Pilgrim, M.D.  Diplomate, Biomedical engineer of Psychiatry and Neurology ( Neurology). 01/08/2020, 3:13 PM

## 2020-01-08 NOTE — Progress Notes (Signed)
Patient stable. Transferring to 3W27C. Belongings sent home with roommate, Barron Alvine. Money and paperwork sent via [facility] and to be given to security by accepting nurse, Bonney Leitz RN, upon arrival. Patient stable showing no s/s of distress. Patient transported via [facility] team by stretcher. Report given to accepting nurse Cianne RN.
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
## 2020-01-09 ENCOUNTER — Inpatient Hospital Stay (HOSPITAL_COMMUNITY): Payer: Medicaid Other

## 2020-01-09 DIAGNOSIS — B182 Chronic viral hepatitis C: Secondary | ICD-10-CM

## 2020-01-09 LAB — CSF CULTURE W GRAM STAIN: Culture: NO GROWTH

## 2020-01-09 LAB — URINALYSIS, ROUTINE W REFLEX MICROSCOPIC
Bilirubin Urine: NEGATIVE
Glucose, UA: NEGATIVE mg/dL
Hgb urine dipstick: NEGATIVE
Ketones, ur: 20 mg/dL — AB
Leukocytes,Ua: NEGATIVE
Nitrite: NEGATIVE
Protein, ur: NEGATIVE mg/dL
Specific Gravity, Urine: 1.018 (ref 1.005–1.030)
pH: 8 (ref 5.0–8.0)

## 2020-01-09 MED ORDER — SODIUM CHLORIDE 0.9 % IV SOLN
1400.0000 mg | Freq: Once | INTRAVENOUS | Status: AC
  Administered 2020-01-09: 1400 mg via INTRAVENOUS
  Filled 2020-01-09: qty 28

## 2020-01-09 MED ORDER — LORAZEPAM 2 MG/ML IJ SOLN
2.0000 mg | Freq: Once | INTRAMUSCULAR | Status: AC
  Administered 2020-01-09: 2 mg via INTRAVENOUS
  Filled 2020-01-09: qty 1

## 2020-01-09 MED ORDER — ONDANSETRON HCL 4 MG/2ML IJ SOLN
4.0000 mg | Freq: Four times a day (QID) | INTRAMUSCULAR | Status: DC | PRN
  Administered 2020-01-09 – 2020-03-18 (×8): 4 mg via INTRAVENOUS
  Filled 2020-01-09 (×8): qty 2

## 2020-01-09 NOTE — Progress Notes (Signed)
LTM EEG hooked up and running - no initial skin breakdown - push button tested - neuro notified.  

## 2020-01-09 NOTE — Progress Notes (Addendum)
6579 - Patient suffering from auditory and visual hallucinations. Patient alert and oriented to self and place but confused about situation and time. Patient states "[name] is in the hall way and she needs to get my lawyer, we have been waiting all day and I know my rights." EEG lines mostly intact Patient removing leads, refusing vital signs and refusing telemetry

## 2020-01-09 NOTE — Consult Note (Addendum)
NEURO HOSPITALIST CONSULT NOTE   Requestig physician: Dr. Lurene Shadow   Reason for Consult: Encephalopathy, Possible Status Epilepticus    History obtained from:  Chart   HPI:                                                                                                                                          Anita Davis is an 34 y.o. female with a past medical history of hepatitis C and polysubstance abuse.   On 9/13 patient brought to the ED by family due to disorientation and suspected methamphetamine abuse. She became more alert and was discharged. Later that day was found down in a parking lot and brought to the ED. UDS positive for benzodiazepine and THC, negative for amphetamine. She required multiple doses of ativan to control agitation.    MRI Brain 9/14 IMPRESSION: Motion degraded study. Axial diffusion imaging only obtained. No abnormality seen to explain the clinical presentation.  The [facility] discussed the patient with [facility] and empiric antibiotics and lumbar puncture were recommended, as well as EEG, with plan for Neurology at [facility] to see the following day.   On 9/15 EEG identified status epilepticus left centro-temporal region. She was transferred to the ICU and started on Keppra. Lumbar puncture did not show signs of infection and antibiotics were stopped. Repeat EEG on 9/16 was improved, but still showed seizure activity. Keppra was increased and another antiepileptic was started, vimpat. She was also given high dose steroids to treat possible autoimmune encephalitis. Pyridoxine (B6) also given. EEG again repeated on 9/17 and Dr. Gerilyn Pilgrim felt the study was improved from the 2 previous studies without clear epileptiform discharges.    She has been transferred to [facility] for continuous EEG monitoring and neurology has been consulted. She is currently on vimpat 200mg  IV Q12 hours. Keppra 1,500mg  Q12 hours. Solumedrol is  scheduled for last dose this afternoon. She is also receiving 100mg  IV B6 daily. Lovenox for DVT Prophylaxis.    Past Medical History:  Diagnosis Date  . Allergy   . Bronchitis   . Hepatitis-C   . History of gestational diabetes   . HPV in female   . Substance abuse [facility])     Past Surgical History:  Procedure Laterality Date  . HEMORRHOID BANDING  age 36    Family History  Problem Relation Age of Onset  . Depression Mother   . Cervical cancer Mother   . Hypertension Father   . Diabetes Father            Social History:  reports that she has been smoking cigarettes and cigars. She has smoked for the past 24.00 years. She has never used smokeless tobacco. She reports previous drug use. Drugs: IV, Cocaine, Hydrocodone,  Oxycodone, Marijuana, and Heroin. She reports that she does not drink alcohol.  No Known Allergies  MEDICATIONS:                                                                                                                     I have reviewed the patient's current medications.   ROS:                                                                                                                                       History obtained from unobtainable from patient due to mental status   Blood pressure 136/82, pulse 60, temperature 98.5 F (36.9 C), temperature source Oral, resp. rate 19, height 5\' 5"  (1.651 m), weight 70.6 kg, last menstrual period 12/12/2019, SpO2 97 %.   General Examination:                                                                                                       Physical Exam  HEENT-  Normocephalic, no lesions, without obvious abnormality.  Normal external eye and conjunctiva.   Cardiovascular-  pulses palpable throughout   Lungs-no excessive working breathing.  Saturations within normal limits Abdomen- All 4 quadrants palpated and nontender Extremities- Warm, dry and intact Musculoskeletal-no joint tenderness, deformity  or swelling Skin-warm and dry, no hyperpigmentation, vitiligo, or suspicious lesions  Neurological Examination Mental Status: Alert,sitting up in bed at looking around the room at times. Moving all extremities spontaneously. She occassionally tenses her body and bedside telemetry increases from low 100s up to 120s. Besides a very slight moan at times she made no attempt at speech. She would not follow any commands verbally or to demonstration. Cranial Nerves: II: unable to asess III,IV, VI: ptosis not present, extra-ocular motions difficult to assess, did move eyes spontanously, but would not follow commands or track movement. round, reactive to light and accommodation V,VII: no obvious facial weakness  XII: tongue extension: unable to assess Motor: Moving all extremities spontaneously and  able to lift to gravity, did not participate in any strength testing.  Tone and bulk:normal tone throughout; no atrophy noted Cerebellar: unable to test   Lab Results: Basic Metabolic Panel: Recent Labs  Lab 01/04/20 1651 01/04/20 1651 01/05/20 1122 01/05/20 1122 01/06/20 0610 01/07/20 0312 01/08/20 0622  NA 143  --  142  --  141 137 139  K 3.5  --  3.3*  --  3.6 3.4* 3.6  CL 105  --  105  --  108 104 104  CO2 24  --  25  --  21* 22 21*  GLUCOSE 139*  --  105*  --  79 82 162*  BUN 27*  --  31*  --  25* 15 22*  CREATININE 1.52*  --  0.98  --  0.75 0.60 0.72  CALCIUM 10.3   < > 9.2   < > 8.7* 8.8* 9.2  MG  --   --   --   --   --  2.3  --    < > = values in this interval not displayed.    CBC: Recent Labs  Lab 01/04/20 0546 01/04/20 1651 01/06/20 0610 01/07/20 0312 01/08/20 0622  WBC 4.6 7.4 4.0 4.0 2.0*  NEUTROABS 2.5 4.8  --  1.1* 0.9*  HGB 16.0* 16.0* 13.8 14.4 15.5*  HCT 49.1* 47.7* 41.6 44.4 44.6  MCV 94.8 91.6 94.3 95.1 89.2  PLT 196 278 205 196 223    Cardiac Enzymes: Recent Labs  Lab 01/04/20 1651  CKTOTAL 324*    Lipid Panel: No results for input(s): CHOL, TRIG,  HDL, CHOLHDL, VLDL, LDLCALC in the last 168 hours.  Imaging: EEG  Result Date: 01/08/2020 Beryle Beams, MD     01/08/2020  3:12 PM [facility] Kofi A. Gerilyn Pilgrim, MD     [email or web address]       HISTORY: This is a 34 year old female who has been in status epilepticus.  This is a repeat EEG to assess treatment as she continues to be encephalopathic. MEDICATIONS: Current Facility-Administered Medications: .  Chlorhexidine Gluconate Cloth 2 % PADS 6 each, 6 each, Topical, Daily, Dahal, Melina Schools, MD, 6 each at 01/08/20 0848 .  enoxaparin (LOVENOX) injection 40 mg, 40 mg, Subcutaneous, Q24H, Adefeso, Oladapo, DO, 40 mg at 01/07/20 2130 .  lacosamide (VIMPAT) 200 mg in sodium chloride 0.9 % 25 mL IVPB, 200 mg, Intravenous, Q12H, Doonquah, Kofi, MD, Last Rate: 90 mL/hr at 01/08/20 1010, 200 mg at 01/08/20 1010 .  levETIRAcetam (KEPPRA) IVPB 1500 mg/ 100 mL premix, 1,500 mg, Intravenous, Q12H, [place]on[place], Kofi, MD, Last Rate: 400 mL/hr at 01/08/20 0804, 1,500 mg at 01/08/20 0804 .  LORazepam (ATIVAN) tablet 1 mg, 1 mg, Oral, Q6H PRN **OR** LORazepam (ATIVAN) injection 1 mg, 1 mg, Intramuscular, Q6H PRN, Gerilyn Pilgrim, Kofi, MD .  methylPREDNISolone sodium succinate (SOLU-MEDROL) 1,000 mg in sodium chloride 0.9 % 50 mL IVPB, 1,000 mg, Intravenous, Daily, Beryle Beams, MD, Stopped at 01/07/20 1821 .  mupirocin ointment (BACTROBAN) 2 % 1 application, 1 application, Nasal, BID, Dahal, Binaya, MD, 1 application at 01/08/20 0848 .  pyridOXINE (B-6) injection 100 mg, 100 mg, Intravenous, Daily, [place], Kofi, MD, 100 mg at 01/08/20 0848 ANALYSIS: A 16 channel recording using standard 10 20 measurements is conducted for 44 minutes.  There is a posterior dominant rhythm of 12 Hz which attenuates with eye opening.  There is significant myogenic and movement artifact seen throughout the recording especially on the right side.  Photic stimulation and hyperventilation are  not conducted.  The patient had various episodes of  arching of the trunk and movements without electrographic correlates.  There is one episode of generalized delta activity in a semirhythmic pattern.  This was associated with the spindles afterwards and the patient being unresponsive but no clear epileptiform discharges are noted. IMPRESSION: 1.  This study shows an episode of generalized slow-wave delta activity without clear associated epileptiform discharges.  No evidence of epileptiform discharges are noted.  The recording is significantly improved compared to the 2 previous studies. Kofi A. Gerilyn Pilgrim, M.D. Diplomate, Biomedical engineer of Psychiatry and Neurology ( Neurology).    Assessment:  Anita Davis is an 34 y.o. female with a past medical history of hepatitis C and polysubstance abuse.    9/13 presented to [facility] ED with disorientation and suspected methamphetamine use. Returned later that day after being found down in a parking lot. UDS positive for benzodiazepine and THC, negative for amphetamine. She required multiple doses of ativan to control agitation.   9/14 MRI Brain IMPRESSION: Motion degraded study. Axial diffusion imaging only obtained. No abnormality seen to explain the clinical presentation.  9/15 EEG identified status epilepticus left centro-temporal region. She was transferred to the ICU and started on Keppra. Lumbar puncture did not show signs of infection and antibiotics were stopped.   9/16 Repeat EEG improved, but still showed seizure activity. Keppra increased and vimpat added. 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 Pyridoxine (B6)  Given.  9/17 EEG repeated and Dr. Gerilyn Pilgrim felt the study was improved from the 2 previous studies without clear epileptiform discharges.    9/18 She has been transferred to [facility] for continuous EEG monitoring and neurology has been consulted. She is currently on vimpat  IV Q12 hours. Keppra 1,500mg  Q12 hours. Solumedrol is scheduled for last dose this  afternoon. She is receiving  IV B6 daily. Lovenox for DVT Prophylaxis.   She is currently alert with eyes open, however she is not properly tracking around the room with movement. She is spontaneously moving all extremities, sitting up and down in bed. She occassionally tenses and heart rate increases from low 100s to 120s. She is not following any commands verbally or to demonstration. She makes no attempt at speech besides a slight infrequent moan.   Impression:  Nonconvulsive Status Epileptics  Recommendations:  Continuous EEG to evaluate for ongoing seizure activity  Plan to repeat MRI when able to tolerate as it was not a complete scan due to movement  Continue Keppra 1,500mg  Q12 hours and Vimpat  IV Q 12 hours. After EEG neurology will determine if medications need to be adjusted.   Lovenox for DVT Prophylaxis   Last dose of solumedrol is today.   Contact precautions for nasal MRSA.      Cathrine Muster DNP, FNP-C [facility] Nurse Practitioner  I have seen the patient and reviewed the above note.  She has now been nonverbal since the 13th with recurrent bursts of activity on EEG.  I reviewed both the initial and repeat EEG, I suspect that the bursts of delta by Dr. Gerilyn Pilgrim notes was likely seizure activity.  There was no interictal activity seen prior to or after it but I was able to appreciate.  I feel that she is likely in continued nonconvulsive status epilepticus despite Keppra and Vimpat.  We will connect her to EEG, she continues to have seizures I would favor giving a dose of Ativan and starting Dilantin, with initial load of fosphenytoin.  Etiology is unclear, though she does have a history of drugs of abuse, UDS on this admission was positive only for THC and benzodiazepines.  It is certainly possible that she used some other substance which does not show up on a drug screen.  LP with normal cells, protein, glucose.  She was started on high-dose  steroids and is completing a 3-day course of IV Solu-Medrol today.  She was also started on pyridoxine.  At this time, I agree that this represents nonconvulsive status epilepticus, but of unclear etiology.  We will connected continuous EEG and neurology will follow.  Ritta SlotMcNeill Virgil Slinger, MD [facility] [phone]  If 7pm- 7am, please page neurology on call as listed in AMION.

## 2020-01-09 NOTE — Progress Notes (Signed)
Some delta activity on EEG that is concerning. Will load with ativan/fosphenytoin and assess for response.   Ritta Slot, MD [facility] [phone]  If 7pm- 7am, please page neurology on call as listed in AMION.

## 2020-01-09 NOTE — Progress Notes (Signed)
PROGRESS NOTE    Anita Davis  ZOX:[id number] DOB: 27-Jul-1985 DOA: 01/04/2020 PCP: [name], PA-C   Brief Narrative:  Patientis a 34 y.o.femalewith medical history significant forhepatitis C and polysubstance abuse who was brought to the ED by EMS for altered mental status.    9/13, patient was brought to the ED around 3:20 AM by family member due to disorientation which was suspected to be due to substance abuse (methamphetamine).  Later in the morning, she was more alert and was hence discharged.   Later in the day 9/13, local police was called to a local business when they found the patient lying down in the parking lot.  She was brought to the ED for further evaluation management.  Patient states that she had methamphetamine in her pants,however, this was not found when she wassearched.   Work-up in the ED included labs, urinalysis, UDS, MRI.  UDS positive for benzodiazepine and THC, negative for amphetamine.  Work-up unremarkable otherwise. She remained altered in the ED for several hours and received intermittent doses of IV Ativan to control agitation.  After about 24 hours in the ED, ED attending reached out to neurologist at: Dr. Amada Jupiter. Because of patient's persistent altered mentation without clear cause, further work-up was recommended. Patient underwent lumbar puncture and was initiated on empiric antibiotics. Patient was admitted to hospitalist service for further evaluation management. Neurology consultation was obtained. 9/15, EEG was obtained which showed that the patient is in status epilepticus.  She was transferred to ICU and started on Keppra with high loading dose.   Assessment & Plan:   Principal Problem:   Acute metabolic encephalopathy Active Problems:   Hypokalemia   Polysubstance abuse (HCC)   Elevated CK   Transaminitis   Acute encephalopathy   Acute metabolic encephalopathy  Status epilepticus -Patient had altered mental  status mental status at presentation.  No convulsions or incontinence was reported. -With EEG report, patient was noted to have underlying status epilepticus, likely is a direct effect of drug overdose due to psychostimulant. -Neurology consulted and appreciated.  Patient is currently on IV Keppra, IV Vimpat, IV Solu-Medrol and as needed Ativan -Patient was transferred to [facility] for long-term EEG for long-term EEG monitoring.  But because of bed not being available, patient was initially being managed in ICU at [facility].  -MRI brain normal.  Lumbar puncture normal.  Urine drug screen negative for methamphetamine or cocaine. -Continue fall precaution and neuro checks -Appetite is poor.  Continue IV fluids.  Hypokalemia -Continue to monitor. Last Labs          Recent Labs  Lab 01/04/20 1651 01/05/20 1122 01/06/20 0610 01/07/20 0312 01/08/20 0622  K 3.5 3.3* 3.6 3.4* 3.6  MG  --   --   --  2.3  --      Elevated transaminitis History of hepatitis C -Repeat liver enzymes tomorrow. Last Labs         Recent Labs  Lab 01/04/20 0546 01/04/20 1651 01/06/20 0610 01/08/20 0622  AST 130* 129* 167* 126*  ALT 121* 121* 141* 163*  ALKPHOS 63 60 50 51  BILITOT 1.0 1.1 1.1 1.1  PROT 9.2* 9.3* 7.9 8.5*  ALBUMIN 5.0 4.8 4.0 4.1     Polysubstance abuse -Counseled to quit.   DVT prophylaxis: Lovenox SQ  Code Status: full    Code Status Orders  (From admission, onward)         Start     Ordered   01/05/20 2217  Full code  Continuous        01/05/20 2216        Code Status History    Date Active Date Inactive Code Status Order ID Comments User Context   01/04/2020 0743 01/04/2020 1539 Full Code [id number]  Devoria Albe, MD ED   01/24/2017 0134 01/25/2017 1337 Full Code [id number]  Pearson Grippe, MD Inpatient   Advance Care Planning Activity     Family Communication: Called Aunt [phone], No answer Disposition Plan:   Pt will remain inpt for further neuor workup and eval.  Pt  is not safe fro d/c with AMS  Consults called: None Admission status: Inpatient   Consultants:   neuro  Procedures:  EEG  Result Date: 01/08/2020 Beryle Beams, MD     01/08/2020  3:12 PM [facility] Kofi A. Gerilyn Pilgrim, MD     [email or web address]       HISTORY: This is a 34 year old female who has been in status epilepticus.  This is a repeat EEG to assess treatment as she continues to be encephalopathic. MEDICATIONS: Current Facility-Administered Medications: .  Chlorhexidine Gluconate Cloth 2 % PADS 6 each, 6 each, Topical, Daily, Dahal, Melina Schools, MD, 6 each at 01/08/20 0848 .  enoxaparin (LOVENOX) injection 40 mg, 40 mg, Subcutaneous, Q24H, Adefeso, Oladapo, DO, 40 mg at 01/07/20 2130 .  lacosamide (VIMPAT) 200 mg in sodium chloride 0.9 % 25 mL IVPB, 200 mg, Intravenous, Q12H, Doonquah, Kofi, MD, Last Rate: 90 mL/hr at 01/08/20 1010, 200 mg at 01/08/20 1010 .  levETIRAcetam (KEPPRA) IVPB 1500 mg/ 100 mL premix, 1,500 mg, Intravenous, Q12H, Doonquah, Kofi, MD, Last Rate: 400 mL/hr at 01/08/20 0804, 1,500 mg at 01/08/20 0804 .  LORazepam (ATIVAN) tablet 1 mg, 1 mg, Oral, Q6H PRN **OR** LORazepam (ATIVAN) injection 1 mg, 1 mg, Intramuscular, Q6H PRN, Gerilyn Pilgrim, Kofi, MD .  methylPREDNISolone sodium succinate (SOLU-MEDROL) 1,000 mg in sodium chloride 0.9 % 50 mL IVPB, 1,000 mg, Intravenous, Daily, Beryle Beams, MD, Stopped at 01/07/20 1821 .  mupirocin ointment (BACTROBAN) 2 % 1 application, 1 application, Nasal, BID, Dahal, Binaya, MD, 1 application at 01/08/20 0848 .  pyridOXINE (B-6) injection 100 mg, 100 mg, Intravenous, Daily, Doonquah, Kofi, MD, 100 mg at 01/08/20 0848 ANALYSIS: A 16 channel recording using standard 10 20 measurements is conducted for 44 minutes.  There is a posterior dominant rhythm of 12 Hz which attenuates with eye opening.  There is significant myogenic and movement artifact seen throughout the recording especially on the right side.  Photic stimulation and  hyperventilation are not conducted.  The patient had various episodes of arching of the trunk and movements without electrographic correlates.  There is one episode of generalized delta activity in a semirhythmic pattern.  This was associated with the spindles afterwards and the patient being unresponsive but no clear epileptiform discharges are noted. IMPRESSION: 1.  This study shows an episode of generalized slow-wave delta activity without clear associated epileptiform discharges.  No evidence of epileptiform discharges are noted.  The recording is significantly improved compared to the 2 previous studies. Kofi A. Gerilyn Pilgrim, M.D. Diplomate, Biomedical engineer of Psychiatry and Neurology ( Neurology).   EEG  Result Date: 01/07/2020 Beryle Beams, MD     01/07/2020 12:50 PM [facility] Kofi A. Gerilyn Pilgrim, MD     [email or web address]       HISTORY: This is a 34 year old female who presents with the altered mental status due to status epilepticus.  This is a repeat EEG to assess improvement.  MEDICATIONS: Current Facility-Administered Medications: .  Chlorhexidine Gluconate Cloth 2 % PADS 6 each, 6 each, Topical, Daily, Dahal, Melina Schools, MD, 6 each at 01/07/20 0907 .  enoxaparin (LOVENOX) injection 40 mg, 40 mg, Subcutaneous, Q24H, Adefeso, Oladapo, DO, 40 mg at 01/06/20 2130 .  lacosamide (VIMPAT) 200 mg in sodium chloride 0.9 % 25 mL IVPB, 200 mg, Intravenous, Q12H, Doonquah, Kofi, MD .  levETIRAcetam (KEPPRA) IVPB 1500 mg/ 100 mL premix, 1,500 mg, Intravenous, Q12H, Doonquah, Kofi, MD .  mupirocin ointment (BACTROBAN) 2 % 1 application, 1 application, Nasal, BID, Dahal, Binaya, MD ANALYSIS: A 16 channel recording using standard 10 20 measurements is conducted for 44 minutes.  During the initial part of the study there is evidence of electrographic seizure with generalized 1 Hz second spike slow wave activity.  The maximum seems to involve the left temporal central region.  The activity involves increasing  frequency to 1.5-2 Hz.  This is associated with some's generalized slowing.  The patient does have a posterior dominant rhythm seen of 10 Hz bilaterally.  Photic stimulation and hyperventilation are not conducted.  The recording actually seems improved relative to the previous study done yesterday. IMPRESSION: 1.  This abnormal recording showing an electrographic seizure.  The recording has improved however relative to the previous study done yesterday. Kofi A. Gerilyn Pilgrim, M.D. Diplomate, Biomedical engineer of Psychiatry and Neurology ( Neurology).   MR BRAIN WO CONTRAST  Result Date: 01/05/2020 CLINICAL DATA:  Combative patient.  Mental status changes. EXAM: MRI HEAD WITHOUT CONTRAST TECHNIQUE: Multiplanar, multiecho pulse sequences of the brain and surrounding structures were obtained without intravenous contrast. COMPARISON:  None. FINDINGS: Brain: Motion degraded study. Axial diffusion imaging only obtained. No sign of acute infarction or other restricted diffusion lesion. No sign of hydrocephalus, mass, hemorrhage or extra-axial collection. Vascular: Major vessels at the base of the brain show flow. Skull and upper cervical spine: No abnormality seen. Sinuses/Orbits: No abnormality seen. Other: None IMPRESSION: Motion degraded study. Axial diffusion imaging only obtained. No abnormality seen to explain the clinical presentation. Electronically Signed   By: Paulina Fusi M.D.   On: 01/05/2020 11:24   EEG adult  Result Date: 01/06/2020 Charlsie Quest, MD     01/06/2020  4:56 PM Patient Name: Arryanna Holquin MRN: [id number] Epilepsy Attending: Charlsie Quest Referring Physician/Provider: Dr Westley Foots Date: 01/06/2020 Duration: 26.15 mins Patient history: 34 y.o.femalewith medical history significant forhepatitis C and polysubstance abuse who was brought to the ED by EMS for altered mental status. EEG to evaluate for seizure. Level of alertness: Awake AEDs during EEG study: None Technical aspects: This  EEG study was done with scalp electrodes positioned according to the 10-20 International system of electrode placement. Electrical activity was acquired at a sampling rate of 500Hz  and reviewed with a high frequency filter of 70Hz  and a low frequency filter of 1Hz . EEG data were recorded continuously and digitally stored. Description: No posterior dominant rhythm was seen. EEG showed frequent seizures without clinical signs arising from left centrotemporal region. EEG showed 5-6hz  spike and wave as well as theta slowing in left centrotemporal region which then spread to involve all left hemisphere followed by right hemisphere and evolved to 1-2hz  delta activity.  Generalized 2-3hz  delta slowing with overriding15-18hz  beta activity was also noted. Hyperventilation and photic stimulation were not performed.   ABNORMALITY - Focal non convulsive status epilepticus, left centro-temporal region - Continuous slow, generalized - Excessive beta, generalized IMPRESSION: This study showed focal non convulsive status epilepticus arising  from left centrotemporal region. Additionally, there is severe diffuse encephalopathy, non specific etiology but likely related to seizures. Dr Pola Corn and Dr Gerilyn Pilgrim were notified. Priyanka [name]      Subjective: No acute changes , not communicating  Objective: Vitals:   01/08/20 2117 01/08/20 2330 01/09/20 0414 01/09/20 0822  BP: 125/71 (!) 147/81 125/65 119/73  Pulse: 65 60 (!) 55 61  Resp: 16 13 13 16   Temp: 98.4 F (36.9 C) 98 F (36.7 C) 98.4 F (36.9 C) 98.3 F (36.8 C)  TempSrc: Oral Oral Oral Oral  SpO2: 97% 97% 97% 98%  Weight:      Height:        Intake/Output Summary (Last 24 hours) at 01/09/2020 1314 Last data filed at 01/09/2020 0911 Gross per 24 hour  Intake 359.93 ml  Output 800 ml  Net -440.07 ml   Filed Weights   01/06/20 1251 01/06/20 1710 01/07/20 0500  Weight: 74.8 kg 71.2 kg 70.6 kg    Examination:  General exam: Appears calm and  comfortable, but not speaking Respiratory system: Clear to auscultation. Respiratory effort normal. Cardiovascular system: S1 & S2 heard, RRR. No JVD, murmurs, rubs, gallops or clicks. No pedal edema. Gastrointestinal system: Abdomen is nondistended, soft and nontender. No organomegaly or masses felt. Normal bowel sounds heard. Central nervous system: Alert,  Moving all 4 extremities, non-communicative. Extremities: no obvious edema, no contractures Skin: No rashes, lesions or ulcers, several tatoos Psychiatry: Judgement and insight severely impaired.     Data Reviewed: I have personally reviewed following labs and imaging studies  CBC: Recent Labs  Lab 01/04/20 0546 01/04/20 1651 01/06/20 0610 01/07/20 0312 01/08/20 0622  WBC 4.6 7.4 4.0 4.0 2.0*  NEUTROABS 2.5 4.8  --  1.1* 0.9*  HGB 16.0* 16.0* 13.8 14.4 15.5*  HCT 49.1* 47.7* 41.6 44.4 44.6  MCV 94.8 91.6 94.3 95.1 89.2  PLT 196 278 205 196 223   Basic Metabolic Panel: Recent Labs  Lab 01/04/20 1651 01/05/20 1122 01/06/20 0610 01/07/20 0312 01/08/20 0622  NA 143 142 141 137 139  K 3.5 3.3* 3.6 3.4* 3.6  CL 105 105 108 104 104  CO2 24 25 21* 22 21*  GLUCOSE 139* 105* 79 82 162*  BUN 27* 31* 25* 15 22*  CREATININE 1.52* 0.98 0.75 0.60 0.72  CALCIUM 10.3 9.2 8.7* 8.8* 9.2  MG  --   --   --  2.3  --    GFR: Estimated Creatinine Clearance: 95.8 mL/min (by C-G formula based on SCr of 0.72 mg/dL). Liver Function Tests: Recent Labs  Lab 01/04/20 0546 01/04/20 1651 01/06/20 0610 01/08/20 0622  AST 130* 129* 167* 126*  ALT 121* 121* 141* 163*  ALKPHOS 63 60 50 51  BILITOT 1.0 1.1 1.1 1.1  PROT 9.2* 9.3* 7.9 8.5*  ALBUMIN 5.0 4.8 4.0 4.1   No results for input(s): LIPASE, AMYLASE in the last 168 hours. No results for input(s): AMMONIA in the last 168 hours. Coagulation Profile: Recent Labs  Lab 01/06/20 0610  INR 1.1   Cardiac Enzymes: Recent Labs  Lab 01/04/20 1651  CKTOTAL 324*   BNP (last 3  results) No results for input(s): PROBNP in the last 8760 hours. HbA1C: No results for input(s): HGBA1C in the last 72 hours. CBG: Recent Labs  Lab 01/04/20 0337 01/07/20 1636 01/08/20 0252  GLUCAP 117* 78 135*   Lipid Profile: No results for input(s): CHOL, HDL, LDLCALC, TRIG, CHOLHDL, LDLDIRECT in the last 72 hours. Thyroid Function  Tests: Recent Labs    01/07/20 0312  TSH 0.955   Anemia Panel: Recent Labs    01/07/20 0312  VITAMINB12 1,488*   Sepsis Labs: No results for input(s): PROCALCITON, LATICACIDVEN in the last 168 hours.  Recent Results (from the past 240 hour(s))  SARS Coronavirus 2 by RT PCR (hospital order, performed in [facility] hospital lab) Nasopharyngeal Nasopharyngeal Swab     Status: None   Collection Time: 01/05/20 10:08 AM   Specimen: Nasopharyngeal Swab  Result Value Ref Range Status   SARS Coronavirus 2 NEGATIVE NEGATIVE Final    Comment: (NOTE) SARS-CoV-2 target nucleic acids are NOT DETECTED.  The SARS-CoV-2 RNA is generally detectable in upper and lower respiratory specimens during the acute phase of infection. The lowest concentration of SARS-CoV-2 viral copies this assay can detect is 250 copies / mL. A negative result does not preclude SARS-CoV-2 infection and should not be used as the sole basis for treatment or other patient management decisions.  A negative result may occur with improper specimen collection / handling, submission of specimen other than nasopharyngeal swab, presence of viral mutation(s) within the areas targeted by this assay, and inadequate number of viral copies (<250 copies / mL). A negative result must be combined with clinical observations, patient history, and epidemiological information.  Fact Sheet for Patients:   BoilerBrush.com.cy  Fact Sheet for Healthcare Providers: https://pope.com/  This test is not yet approved or  cleared by the [place] FDA  and has been authorized for detection and/or diagnosis of SARS-CoV-2 by FDA under an Emergency Use Authorization (EUA).  This EUA will remain in effect (meaning this test can be used) for the duration of the COVID-19 declaration under Section 564(b)(1) of the Act, 21 U.S.C. section 360bbb-3(b)(1), unless the authorization is terminated or revoked sooner.  Performed at [facility], [address] [place], [place] [address]   CSF culture     Status: None   Collection Time: 01/05/20  6:28 PM   Specimen: Lumbar Puncture; Cerebrospinal Fluid  Result Value Ref Range Status   Specimen Description   Final    LUMBAR Performed at [facility], [address] [place], [place] [address]    Special Requests   Final    NONE Performed at [facility], [address] [place], [place] [address]    Culture   Final    NO GROWTH Performed at [facility], [address], [place], [place] [address]    Report Status 01/09/2020 FINAL  Final  Blood culture (routine x 2)     Status: None (Preliminary result)   Collection Time: 01/05/20  9:15 PM   Specimen: BLOOD LEFT HAND  Result Value Ref Range Status   Specimen Description BLOOD LEFT HAND  Final   Special Requests   Final    BOTTLES DRAWN AEROBIC AND ANAEROBIC Blood Culture adequate volume   Culture   Final    NO GROWTH 4 DAYS Performed at [facility], [address], [place], [place] [address]    Report Status PENDING  Incomplete  Blood culture (routine x 2)     Status: None (Preliminary result)   Collection Time: 01/05/20  9:18 PM   Specimen: BLOOD LEFT ARM  Result Value Ref Range Status   Specimen Description BLOOD LEFT ARM  Final   Special Requests   Final    BOTTLES DRAWN AEROBIC AND ANAEROBIC Blood Culture adequate volume   Culture   Final    NO GROWTH 4  DAYS Performed at [facility], [address] [place], [place] [address]    Report Status PENDING  Incomplete  Gram stain     Status: None   Collection Time: 01/05/20  9:24  PM   Specimen: CSF  Result Value Ref Range Status   Specimen Description CSF  Final   Special Requests NONE  Final   Gram Stain   Final    CYTOSPIN SMEAR NO WBC SEEN NO ORGANISMS SEEN Performed at [facility], [address], [place], [place] [address]    Report Status 01/05/2020 FINAL  Final  MRSA PCR Screening     Status: Abnormal   Collection Time: 01/06/20  5:12 PM   Specimen: Nasal Mucosa; Nasopharyngeal  Result Value Ref Range Status   MRSA by PCR POSITIVE (A) NEGATIVE Corrected    Comment: TETREAULT,H  BY HUFFINES,S 9.16.2021         The GeneXpert MRSA Assay (FDA approved for NASAL specimens only), is one component of a comprehensive MRSA colonization surveillance program. It is not intended to diagnose MRSA infection nor to guide or monitor treatment for MRSA infections. Performed at [facility], [address] [place], [place] [address] CORRECTED ON 09/16 AT 0654: PREVIOUSLY REPORTED AS POSITIVE        The GeneXpert MRSA Assay (FDA approved for NASAL specimens only), is one component of a comprehensive MRSA colonization surveillance program. It is not intended to diagnose MRSA infection  nor to guide or monitor treatment for MRSA infections. RESULT CALLED TO, READ BACK BY AND VERIFIED WITH: TETREAULT,H          Radiology Studies: EEG  Result Date: 01/08/2020 Beryle Beams, MD     01/08/2020  3:12 PM [facility] Kofi A. Gerilyn Pilgrim, MD     [email or web address]       HISTORY: This is a 34 year old female who has been in status epilepticus.  This is a repeat EEG to assess treatment as she continues to be encephalopathic. MEDICATIONS: Current Facility-Administered Medications: .  Chlorhexidine Gluconate Cloth 2 % PADS 6 each, 6 each, Topical, Daily, Dahal, Melina Schools, MD, 6 each at 01/08/20 0848 .  enoxaparin (LOVENOX) injection 40 mg, 40 mg, Subcutaneous, Q24H, Adefeso, Oladapo, DO, 40 mg at 01/07/20 2130 .  lacosamide (VIMPAT) 200 mg in sodium chloride 0.9 %  25 mL IVPB, 200 mg, Intravenous, Q12H, Doonquah, Kofi, MD, Last Rate: 90 mL/hr at 01/08/20 1010, 200 mg at 01/08/20 1010 .  levETIRAcetam (KEPPRA) IVPB 1500 mg/ 100 mL premix, 1,500 mg, Intravenous, Q12H, Doonquah, Kofi, MD, Last Rate: 400 mL/hr at 01/08/20 0804, 1,500 mg at 01/08/20 0804 .  LORazepam (ATIVAN) tablet 1 mg, 1 mg, Oral, Q6H PRN **OR** LORazepam (ATIVAN) injection 1 mg, 1 mg, Intramuscular, Q6H PRN, Gerilyn Pilgrim, Kofi, MD .  methylPREDNISolone sodium succinate (SOLU-MEDROL) 1,000 mg in sodium chloride 0.9 % 50 mL IVPB, 1,000 mg, Intravenous, Daily, Beryle Beams, MD, Stopped at 01/07/20 1821 .  mupirocin ointment (BACTROBAN) 2 % 1 application, 1 application, Nasal, BID, Dahal, Binaya, MD, 1 application at 01/08/20 0848 .  pyridOXINE (B-6) injection 100 mg, 100 mg, Intravenous, Daily, Doonquah, Kofi, MD, 100 mg at 01/08/20 0848 ANALYSIS: A 16 channel recording using standard 10 20 measurements is conducted for 44 minutes.  There is a posterior dominant rhythm of 12 Hz which attenuates with eye opening.  There is significant myogenic and movement artifact seen throughout the recording especially on the right side.  Photic stimulation and hyperventilation are not conducted.  The patient had various  episodes of arching of the trunk and movements without electrographic correlates.  There is one episode of generalized delta activity in a semirhythmic pattern.  This was associated with the spindles afterwards and the patient being unresponsive but no clear epileptiform discharges are noted. IMPRESSION: 1.  This study shows an episode of generalized slow-wave delta activity without clear associated epileptiform discharges.  No evidence of epileptiform discharges are noted.  The recording is significantly improved compared to the 2 previous studies. Kofi A. Gerilyn Pilgrimoonquah, M.D. Diplomate, Biomedical engineerAmerican Board of Psychiatry and Neurology ( Neurology).        Scheduled Meds: . Chlorhexidine Gluconate Cloth  6 each  Topical Daily  . enoxaparin (LOVENOX) injection  40 mg Subcutaneous Q24H  . mouth rinse  15 mL Mouth Rinse BID  . mupirocin ointment  1 application Nasal BID  . pyridOXINE  100 mg Intravenous Daily   Continuous Infusions: . lacosamide (VIMPAT) IV 200 mg (01/09/20 1139)  . levETIRAcetam 1,500 mg (01/09/20 0910)  . methylPREDNISolone (SOLU-MEDROL) injection       LOS: 3 days    Time spent: 35 min    Burke Keelshristopher Grisel Blumenstock, MD [facility]  If 7PM-7AM, please contact night-coverage  01/09/2020, 1:14 PM

## 2020-01-10 ENCOUNTER — Inpatient Hospital Stay (HOSPITAL_COMMUNITY): Payer: Medicaid Other

## 2020-01-10 DIAGNOSIS — R569 Unspecified convulsions: Secondary | ICD-10-CM

## 2020-01-10 LAB — CBC WITH DIFFERENTIAL/PLATELET
Abs Immature Granulocytes: 0.06 10*3/uL (ref 0.00–0.07)
Basophils Absolute: 0 10*3/uL (ref 0.0–0.1)
Basophils Relative: 0 %
Eosinophils Absolute: 0 10*3/uL (ref 0.0–0.5)
Eosinophils Relative: 0 %
HCT: 40.6 % (ref 36.0–46.0)
Hemoglobin: 14.1 g/dL (ref 12.0–15.0)
Immature Granulocytes: 2 %
Lymphocytes Relative: 25 %
Lymphs Abs: 1 10*3/uL (ref 0.7–4.0)
MCH: 30.5 pg (ref 26.0–34.0)
MCHC: 34.7 g/dL (ref 30.0–36.0)
MCV: 87.9 fL (ref 80.0–100.0)
Monocytes Absolute: 0.1 10*3/uL (ref 0.1–1.0)
Monocytes Relative: 2 %
Neutro Abs: 2.9 10*3/uL (ref 1.7–7.7)
Neutrophils Relative %: 71 %
Platelets: 225 10*3/uL (ref 150–400)
RBC: 4.62 MIL/uL (ref 3.87–5.11)
RDW: 11.7 % (ref 11.5–15.5)
WBC: 4 10*3/uL (ref 4.0–10.5)
nRBC: 0 % (ref 0.0–0.2)

## 2020-01-10 LAB — COMPREHENSIVE METABOLIC PANEL
ALT: 362 U/L — ABNORMAL HIGH (ref 0–44)
AST: 191 U/L — ABNORMAL HIGH (ref 15–41)
Albumin: 3.9 g/dL (ref 3.5–5.0)
Alkaline Phosphatase: 44 U/L (ref 38–126)
Anion gap: 11 (ref 5–15)
BUN: 20 mg/dL (ref 6–20)
CO2: 23 mmol/L (ref 22–32)
Calcium: 9.1 mg/dL (ref 8.9–10.3)
Chloride: 106 mmol/L (ref 98–111)
Creatinine, Ser: 0.78 mg/dL (ref 0.44–1.00)
GFR calc Af Amer: >60 mL/min (ref >60)
GFR calc non Af Amer: >60 mL/min (ref >60)
Glucose, Bld: 182 mg/dL — ABNORMAL HIGH (ref 70–99)
Potassium: 3.6 mmol/L (ref 3.5–5.1)
Sodium: 140 mmol/L (ref 135–145)
Total Bilirubin: 1.2 mg/dL (ref 0.3–1.2)
Total Protein: 7.5 g/dL (ref 6.5–8.1)

## 2020-01-10 LAB — CULTURE, BLOOD (ROUTINE X 2)
Culture: NO GROWTH
Culture: NO GROWTH
Special Requests: ADEQUATE
Special Requests: ADEQUATE

## 2020-01-10 MED ORDER — LORAZEPAM 2 MG/ML IJ SOLN
1.0000 mg | Freq: Once | INTRAMUSCULAR | Status: AC
  Administered 2020-01-10: 1 mg via INTRAVENOUS
  Filled 2020-01-10: qty 1

## 2020-01-10 MED ORDER — SODIUM CHLORIDE 0.9 % IV SOLN
1000.0000 mg | Freq: Every day | INTRAVENOUS | Status: AC
  Administered 2020-01-10 – 2020-01-11 (×2): 1000 mg via INTRAVENOUS
  Filled 2020-01-10 (×2): qty 8

## 2020-01-10 MED ORDER — PHENYTOIN SODIUM 50 MG/ML IJ SOLN
100.0000 mg | Freq: Three times a day (TID) | INTRAMUSCULAR | Status: DC
  Administered 2020-01-10: 100 mg via INTRAVENOUS
  Administered 2020-01-10: 75 mg via INTRAVENOUS
  Administered 2020-01-11 – 2020-01-25 (×43): 100 mg via INTRAVENOUS
  Filled 2020-01-10 (×46): qty 2

## 2020-01-10 NOTE — Progress Notes (Signed)
PROGRESS NOTE    Anita Davis  ZOX:[id number] DOB: December 15, 1985 DOA: 01/04/2020 PCP: [name], PA-C   Brief Narrative:  Patientis a 34 y.o.femalewith medical history significant forhepatitis C and polysubstance abuse who was brought to the ED by EMS for altered mental status.   9/13, patient was brought to the ED around 3:20 AM by family member due to disorientation which was suspected to be due to substance abuse (methamphetamine). Later in the morning, she was more alert and was hence discharged.   Later in the day 9/13, local police was called to a local business when they found the patient lying down in the parking lot. She was brought to the ED for further evaluation management. Patient states that she had methamphetamine in her pants,however, this was not found when she wassearched.   Work-up in the ED included labs, urinalysis, UDS, MRI. UDS positive for benzodiazepine and THC, negative for amphetamine. Work-up unremarkable otherwise. She remained altered in the ED for several hours and received intermittent doses of IV Ativan to control agitation.  After about 24 hours in the ED, ED attending reached out to neurologist at: Dr. Amada Jupiter. Because of patient's persistent altered mentation without clear cause, further work-up was recommended. Patient underwent lumbar puncture and was initiated on empiric antibiotics. Patient was admitted to hospitalist service for further evaluation management. Neurology consultation was obtained. 9/15, EEG was obtained which showed that the patient is in status epilepticus. She was transferred to ICU and started on Keppra with high loading dose   Assessment & Plan:   Principal Problem:   Acute metabolic encephalopathy Active Problems:   Hypokalemia   Polysubstance abuse (HCC)   Elevated CK   Transaminitis   Acute encephalopathy   Acute metabolic encephalopathy  Status epilepticus -Seen by neuro at [facility] and  [facility] -Per neurology;  1) continue phenytoin 100 3 times daily 2) continue Keppra 1500 twice daily 3) continue Vimpat 200 mg twice daily 4) Solu-Medrol 1 g for two additional days for total of 5 days. 5) serum autoimmune epilepsy panel, could consider sending it from CSF if they have enough at [facility]. 6) neurology will continue to follow.  Hypokalemia -Continue to monitor.  Elevated transaminitis History of hepatitis C -Repeat liver enzymes tomorrow.  Polysubstance abuse -Counseled to quit.   DVT prophylaxis: Lovenox SQ  Code Status: FULL    Code Status Orders  (From admission, onward)         Start     Ordered   01/05/20 2217  Full code  Continuous        01/05/20 2216        Code Status History    Date Active Date Inactive Code Status Order ID Comments User Context   01/04/2020 0743 01/04/2020 1539 Full Code [id number]  Devoria Albe, MD ED   01/24/2017 0134 01/25/2017 1337 Full Code [id number]  Pearson Grippe, MD Inpatient   Advance Care Planning Activity     Family Communication: Trying to obtain fathers phone-discussed with him sat Disposition Plan:    Pt will remain inpt for further neuor workup and eval.  Pt is not safe fro d/c with AMS  Consults called: None Admission status: Inpatient   Consultants:   neuro  Procedures:  EEG  Result Date: 01/08/2020 Beryle Beams, MD     01/08/2020  3:12 PM [facility] Kofi A. Gerilyn Pilgrim, MD     [email or web address]       HISTORY: This is a 34 year old female who has been in  status epilepticus.  This is a repeat EEG to assess treatment as she continues to be encephalopathic. MEDICATIONS: Current Facility-Administered Medications: .  Chlorhexidine Gluconate Cloth 2 % PADS 6 each, 6 each, Topical, Daily, Dahal, Melina Schools, MD, 6 each at 01/08/20 0848 .  enoxaparin (LOVENOX) injection 40 mg, 40 mg, Subcutaneous, Q24H, Adefeso, Oladapo, DO, 40 mg at 01/07/20 2130 .  lacosamide (VIMPAT) 200 mg in sodium chloride 0.9 % 25 mL  IVPB, 200 mg, Intravenous, Q12H, Doonquah, Kofi, MD, Last Rate: 90 mL/hr at 01/08/20 1010, 200 mg at 01/08/20 1010 .  levETIRAcetam (KEPPRA) IVPB 1500 mg/ 100 mL premix, 1,500 mg, Intravenous, Q12H, Doonquah, Kofi, MD, Last Rate: 400 mL/hr at 01/08/20 0804, 1,500 mg at 01/08/20 0804 .  LORazepam (ATIVAN) tablet 1 mg, 1 mg, Oral, Q6H PRN **OR** LORazepam (ATIVAN) injection 1 mg, 1 mg, Intramuscular, Q6H PRN, Gerilyn Pilgrim, Kofi, MD .  methylPREDNISolone sodium succinate (SOLU-MEDROL) 1,000 mg in sodium chloride 0.9 % 50 mL IVPB, 1,000 mg, Intravenous, Daily, Beryle Beams, MD, Stopped at 01/07/20 1821 .  mupirocin ointment (BACTROBAN) 2 % 1 application, 1 application, Nasal, BID, Dahal, Binaya, MD, 1 application at 01/08/20 0848 .  pyridOXINE (B-6) injection 100 mg, 100 mg, Intravenous, Daily, Doonquah, Kofi, MD, 100 mg at 01/08/20 0848 ANALYSIS: A 16 channel recording using standard 10 20 measurements is conducted for 44 minutes.  There is a posterior dominant rhythm of 12 Hz which attenuates with eye opening.  There is significant myogenic and movement artifact seen throughout the recording especially on the right side.  Photic stimulation and hyperventilation are not conducted.  The patient had various episodes of arching of the trunk and movements without electrographic correlates.  There is one episode of generalized delta activity in a semirhythmic pattern.  This was associated with the spindles afterwards and the patient being unresponsive but no clear epileptiform discharges are noted. IMPRESSION: 1.  This study shows an episode of generalized slow-wave delta activity without clear associated epileptiform discharges.  No evidence of epileptiform discharges are noted.  The recording is significantly improved compared to the 2 previous studies. Kofi A. Gerilyn Pilgrim, M.D. Diplomate, Biomedical engineer of Psychiatry and Neurology ( Neurology).   EEG  Result Date: 01/07/2020 Beryle Beams, MD     01/07/2020 12:50 PM  [facility] Kofi A. Gerilyn Pilgrim, MD     [email or web address]       HISTORY: This is a 34 year old female who presents with the altered mental status due to status epilepticus.  This is a repeat EEG to assess improvement. MEDICATIONS: Current Facility-Administered Medications: .  Chlorhexidine Gluconate Cloth 2 % PADS 6 each, 6 each, Topical, Daily, Dahal, Melina Schools, MD, 6 each at 01/07/20 0907 .  enoxaparin (LOVENOX) injection 40 mg, 40 mg, Subcutaneous, Q24H, Adefeso, Oladapo, DO, 40 mg at 01/06/20 2130 .  lacosamide (VIMPAT) 200 mg in sodium chloride 0.9 % 25 mL IVPB, 200 mg, Intravenous, Q12H, Doonquah, Kofi, MD .  levETIRAcetam (KEPPRA) IVPB 1500 mg/ 100 mL premix, 1,500 mg, Intravenous, Q12H, Doonquah, Kofi, MD .  mupirocin ointment (BACTROBAN) 2 % 1 application, 1 application, Nasal, BID, Dahal, Binaya, MD ANALYSIS: A 16 channel recording using standard 10 20 measurements is conducted for 44 minutes.  During the initial part of the study there is evidence of electrographic seizure with generalized 1 Hz second spike slow wave activity.  The maximum seems to involve the left temporal central region.  The activity involves increasing frequency to 1.5-2 Hz.  This is associated with some's generalized slowing.  The patient does have a posterior dominant rhythm seen of 10 Hz bilaterally.  Photic stimulation and hyperventilation are not conducted.  The recording actually seems improved relative to the previous study done yesterday. IMPRESSION: 1.  This abnormal recording showing an electrographic seizure.  The recording has improved however relative to the previous study done yesterday. Kofi A. Gerilyn Pilgrim, M.D. Diplomate, Biomedical engineer of Psychiatry and Neurology ( Neurology).   MR BRAIN WO CONTRAST  Result Date: 01/05/2020 CLINICAL DATA:  Combative patient.  Mental status changes. EXAM: MRI HEAD WITHOUT CONTRAST TECHNIQUE: Multiplanar, multiecho pulse sequences of the brain and surrounding structures were  obtained without intravenous contrast. COMPARISON:  None. FINDINGS: Brain: Motion degraded study. Axial diffusion imaging only obtained. No sign of acute infarction or other restricted diffusion lesion. No sign of hydrocephalus, mass, hemorrhage or extra-axial collection. Vascular: Major vessels at the base of the brain show flow. Skull and upper cervical spine: No abnormality seen. Sinuses/Orbits: No abnormality seen. Other: None IMPRESSION: Motion degraded study. Axial diffusion imaging only obtained. No abnormality seen to explain the clinical presentation. Electronically Signed   By: Paulina Fusi M.D.   On: 01/05/2020 11:24   EEG adult  Result Date: 01/06/2020 Charlsie Quest, MD     01/06/2020  4:56 PM Patient Name: Anita Davis MRN: [id number] Epilepsy Attending: Charlsie Quest Referring Physician/Provider: Dr Westley Foots Date: 01/06/2020 Duration: 26.15 mins Patient history: 34 y.o.femalewith medical history significant forhepatitis C and polysubstance abuse who was brought to the ED by EMS for altered mental status. EEG to evaluate for seizure. Level of alertness: Awake AEDs during EEG study: None Technical aspects: This EEG study was done with scalp electrodes positioned according to the 10-20 International system of electrode placement. Electrical activity was acquired at a sampling rate of  and reviewed with a high frequency filter of  and a low frequency filter of . EEG data were recorded continuously and digitally stored. Description: No posterior dominant rhythm was seen. EEG showed frequent seizures without clinical signs arising from left centrotemporal region. EEG showed 5-6hz  spike and wave as well as theta slowing in left centrotemporal region which then spread to involve all left hemisphere followed by right hemisphere and evolved to 1-2hz  delta activity.  Generalized 2-3hz  delta slowing with overriding15-18hz  beta activity was also noted. Hyperventilation and photic  stimulation were not performed.   ABNORMALITY - Focal non convulsive status epilepticus, left centro-temporal region - Continuous slow, generalized - Excessive beta, generalized IMPRESSION: This study showed focal non convulsive status epilepticus arising from left centrotemporal region. Additionally, there is severe diffuse encephalopathy, non specific etiology but likely related to seizures. Dr Pola Corn and Dr Gerilyn Pilgrim were notified. Priyanka [name]   Overnight EEG with video  Result Date: 01/10/2020 Charlsie Quest, MD     01/10/2020  9:22 AM //Patient Name: Anita Davis // MRN: [id number] Epilepsy Attending: Charlsie Quest   Referring Physician/Provider: Dr Ritta Slot Duration: 01/09/2020 1643 to 01/10/2020 0900 Patient history: 34 y.o.femalewith medical history significant forhepatitis C and polysubstance abuse whowas brought to the ED by EMS for altered mental status and found to be in status. EEG to evaluate for seizure. Level of alertness: Awake, asleep AEDs during EEG study: LEV, Vmpat, PHT Technical aspects: This EEG study was done with scalp electrodes positioned according to the 10-20 International system of electrode placement. Electrical activity was acquired at a sampling rate of  and reviewed with a high frequency filter of  and a low frequency filter of  1Hz . EEG data were recorded continuously and digitally stored. Description: The posterior dominant rhythm consists of  10 Hz activity of moderate voltage (25-35 uV) seen predominantly in posterior head regions, symmetric and reactive to eye opening and eye closing. Sleep was characterized by vertex waves, sleep spindles (12 to 14 Hz), maximal frontocentral region.  EEG showed intermittent rhythmic 2-3hz  sharply contoured delta activity in left hemisphere which at times shows evolution in amplitude, lasting 2-7 seconds. At times quasi PLEd-like sharp waves in left centrotemporal region were also noted.  Hyperventilation and  photic stimulation were not performed.   ABNORMALITY - Sharp waves, left centro-temporal region - Intermittent rhythmic delta activity, lateralized left hemisphere ( LRDA) IMPRESSION: This study showed evidence of epileptogenicity arising from left centrotemporal region as well as cortical dysfunction in left hemisphere. LRDA is on the ictal-interictal continuum and due to evolution in amplitude and sharply contoured morphology, this could be ictal in nature. No definite seizures were seen during this study. EEG appears to have improved compared to prior eegs. Priyanka       Subjective: Significantly imrpoved from yesterday, still with confusion but more conversant  Objective: Vitals:   01/09/20 2025 01/09/20 2335 01/10/20 0843 01/10/20 1139  BP: 120/86 125/87 136/81 118/77  Pulse: 87 82 83 81  Resp: (!) 23 15 18 18   Temp: 98 F (36.7 C) 98.6 F (37 C) 99.1 F (37.3 C) 98.6 F (37 C)  TempSrc: Oral Oral Oral Oral  SpO2: 94% 96% 96% 91%  Weight:      Height:        Intake/Output Summary (Last 24 hours) at 01/10/2020 1332 Last data filed at 01/10/2020 0400 Gross per 24 hour  Intake 240 ml  Output 1200 ml  Net -960 ml   Filed Weights   01/06/20 1251 01/06/20 1710 01/07/20 0500  Weight: 74.8 kg 71.2 kg 70.6 kg    Examination:  General exam: Appears calm and comfortable, Respiratory system: Clear to auscultation. Respiratory effort normal. Cardiovascular system: S1 & S2 heard, RRR. No JVD, murmurs, rubs, gallops or clicks. No pedal edema. Gastrointestinal system: Abdomen is nondistended, soft and nontender. No organomegaly or masses felt. Normal bowel sounds heard. Central nervous system: Alert,  Moving all 4 extremities, much more communicative but still mod confused Extremities: no obvious edema, no contractures Skin: No rashes, lesions or ulcers, several tatoos Psychiatry: Judgement and insight severely impaired..     Data Reviewed: I have personally reviewed  following labs and imaging studies  CBC: Recent Labs  Lab 01/04/20 0546 01/04/20 1651 01/06/20 0610 01/07/20 0312 01/08/20 0622  WBC 4.6 7.4 4.0 4.0 2.0*  NEUTROABS 2.5 4.8  --  1.1* 0.9*  HGB 16.0* 16.0* 13.8 14.4 15.5*  HCT 49.1* 47.7* 41.6 44.4 44.6  MCV 94.8 91.6 94.3 95.1 89.2  PLT 196 278 205 196 223   Basic Metabolic Panel: Recent Labs  Lab 01/04/20 1651 01/05/20 1122 01/06/20 0610 01/07/20 0312 01/08/20 0622  NA 143 142 141 137 139  K 3.5 3.3* 3.6 3.4* 3.6  CL 105 105 108 104 104  CO2 24 25 21* 22 21*  GLUCOSE 139* 105* 79 82 162*  BUN 27* 31* 25* 15 22*  CREATININE 1.52* 0.98 0.75 0.60 0.72  CALCIUM 10.3 9.2 8.7* 8.8* 9.2  MG  --   --   --  2.3  --    GFR: Estimated Creatinine Clearance: 95.8 mL/min (by C-G formula based on SCr of 0.72 mg/dL). Liver Function Tests:  Recent Labs  Lab 01/04/20 0546 01/04/20 1651 01/06/20 0610 01/08/20 0622  AST 130* 129* 167* 126*  ALT 121* 121* 141* 163*  ALKPHOS 63 60 50 51  BILITOT 1.0 1.1 1.1 1.1  PROT 9.2* 9.3* 7.9 8.5*  ALBUMIN 5.0 4.8 4.0 4.1   No results for input(s): LIPASE, AMYLASE in the last 168 hours. No results for input(s): AMMONIA in the last 168 hours. Coagulation Profile: Recent Labs  Lab 01/06/20 0610  INR 1.1   Cardiac Enzymes: Recent Labs  Lab 01/04/20 1651  CKTOTAL 324*   BNP (last 3 results) No results for input(s): PROBNP in the last 8760 hours. HbA1C: No results for input(s): HGBA1C in the last 72 hours. CBG: Recent Labs  Lab 01/04/20 0337 01/07/20 1636 01/08/20 0252  GLUCAP 117* 78 135*   Lipid Profile: No results for input(s): CHOL, HDL, LDLCALC, TRIG, CHOLHDL, LDLDIRECT in the last 72 hours. Thyroid Function Tests: No results for input(s): TSH, T4TOTAL, FREET4, T3FREE, THYROIDAB in the last 72 hours. Anemia Panel: No results for input(s): VITAMINB12, FOLATE, FERRITIN, TIBC, IRON, RETICCTPCT in the last 72 hours. Sepsis Labs: No results for input(s): PROCALCITON,  LATICACIDVEN in the last 168 hours.  Recent Results (from the past 240 hour(s))  SARS Coronavirus 2 by RT PCR (hospital order, performed in [facility] hospital lab) Nasopharyngeal Nasopharyngeal Swab     Status: None   Collection Time: 01/05/20 10:08 AM   Specimen: Nasopharyngeal Swab  Result Value Ref Range Status   SARS Coronavirus 2 NEGATIVE NEGATIVE Final    Comment: (NOTE) SARS-CoV-2 target nucleic acids are NOT DETECTED.  The SARS-CoV-2 RNA is generally detectable in upper and lower respiratory specimens during the acute phase of infection. The lowest concentration of SARS-CoV-2 viral copies this assay can detect is 250 copies / mL. A negative result does not preclude SARS-CoV-2 infection and should not be used as the sole basis for treatment or other patient management decisions.  A negative result may occur with improper specimen collection / handling, submission of specimen other than nasopharyngeal swab, presence of viral mutation(s) within the areas targeted by this assay, and inadequate number of viral copies (<250 copies / mL). A negative result must be combined with clinical observations, patient history, and epidemiological information.  Fact Sheet for Patients:   BoilerBrush.com.cy  Fact Sheet for Healthcare Providers: https://pope.com/  This test is not yet approved or  cleared by the [place] FDA and has been authorized for detection and/or diagnosis of SARS-CoV-2 by FDA under an Emergency Use Authorization (EUA).  This EUA will remain in effect (meaning this test can be used) for the duration of the COVID-19 declaration under Section 564(b)(1) of the Act, 21 U.S.C. section 360bbb-3(b)(1), unless the authorization is terminated or revoked sooner.  Performed at [facility], [address] [place], [place] [address]   CSF culture     Status: None   Collection Time: 01/05/20  6:28 PM   Specimen: Lumbar  Puncture; Cerebrospinal Fluid  Result Value Ref Range Status   Specimen Description   Final    LUMBAR Performed at [facility], [address] [place], [place] [address]    Special Requests   Final    NONE Performed at [facility], [address], [place], [place] [address]    Culture   Final    NO GROWTH Performed at [facility], [address], [place], [place] [address]    Report Status 01/09/2020 FINAL  Final  Blood culture (  routine x 2)     Status: None   Collection Time: 01/05/20  9:15 PM   Specimen: BLOOD LEFT HAND  Result Value Ref Range Status   Specimen Description BLOOD LEFT HAND  Final   Special Requests   Final    BOTTLES DRAWN AEROBIC AND ANAEROBIC Blood Culture adequate volume   Culture   Final    NO GROWTH 5 DAYS Performed at [facility], [address] [place], [place] [address]    Report Status 01/10/2020 FINAL  Final  Blood culture (routine x 2)     Status: None   Collection Time: 01/05/20  9:18 PM   Specimen: BLOOD LEFT ARM  Result Value Ref Range Status   Specimen Description BLOOD LEFT ARM  Final   Special Requests   Final    BOTTLES DRAWN AEROBIC AND ANAEROBIC Blood Culture adequate volume   Culture   Final    NO GROWTH 5 DAYS Performed at [facility], [address], [place], [place] [address]    Report Status 01/10/2020 FINAL  Final  Gram stain     Status: None   Collection Time: 01/05/20  9:24 PM   Specimen: CSF  Result Value Ref Range Status   Specimen Description CSF  Final   Special Requests NONE  Final   Gram Stain   Final    CYTOSPIN SMEAR NO WBC SEEN NO ORGANISMS SEEN Performed at [facility], [address], [place], [place] [address]    Report Status 01/05/2020 FINAL  Final  MRSA PCR Screening     Status: Abnormal   Collection Time: 01/06/20  5:12 PM   Specimen: Nasal Mucosa; Nasopharyngeal  Result Value Ref Range Status   MRSA by PCR POSITIVE (A) NEGATIVE Corrected    Comment: TETREAULT,H @0638  BY HUFFINES,S  9.16.2021         The GeneXpert MRSA Assay (FDA approved for NASAL specimens only), is one component of a comprehensive MRSA colonization surveillance program. It is not intended to diagnose MRSA infection nor to guide or monitor treatment for MRSA infections. Performed at [facility], [address], [place], [place] [address] CORRECTED ON 09/16 AT 0654: PREVIOUSLY REPORTED AS POSITIVE        The GeneXpert MRSA Assay (FDA approved for NASAL specimens only), is one component of a comprehensive MRSA colonization surveillance program. It is not intended to diagnose MRSA infection  nor to guide or monitor treatment for MRSA infections. RESULT CALLED TO, READ BACK BY AND VERIFIED WITH: TETREAULT,H          Radiology Studies: EEG  Result Date: 01/08/2020 Beryle Beams, MD     01/08/2020  3:12 PM [facility] Kofi A. Gerilyn Pilgrim, MD     [email or web address]       HISTORY: This is a 34 year old female who has been in status epilepticus.  This is a repeat EEG to assess treatment as she continues to be encephalopathic. MEDICATIONS: Current Facility-Administered Medications: .  Chlorhexidine Gluconate Cloth 2 % PADS 6 each, 6 each, Topical, Daily, Dahal, Melina Schools, MD, 6 each at 01/08/20 0848 .  enoxaparin (LOVENOX) injection 40 mg, 40 mg, Subcutaneous, Q24H, Adefeso, Oladapo, DO, 40 mg at 01/07/20 2130 .  lacosamide (VIMPAT) 200 mg in sodium chloride 0.9 % 25 mL IVPB, 200 mg, Intravenous, Q12H, Doonquah, Kofi, MD, Last Rate: 90 mL/hr at 01/08/20 1010, 200 mg at 01/08/20 1010 .  levETIRAcetam (KEPPRA) IVPB 1500 mg/ 100 mL premix, 1,500 mg, Intravenous, Q12H, Doonquah, Kofi, MD, Last Rate: 400  mL/hr at 01/08/20 0804, 1,500 mg at 01/08/20 0804 .  LORazepam (ATIVAN) tablet 1 mg, 1 mg, Oral, Q6H PRN **OR** LORazepam (ATIVAN) injection 1 mg, 1 mg, Intramuscular, Q6H PRN, Gerilyn Pilgrim, Kofi, MD .  methylPREDNISolone sodium succinate (SOLU-MEDROL) 1,000 mg in sodium chloride 0.9 % 50 mL IVPB, 1,000 mg,  Intravenous, Daily, Beryle Beams, MD, Stopped at 01/07/20 1821 .  mupirocin ointment (BACTROBAN) 2 % 1 application, 1 application, Nasal, BID, Dahal, Binaya, MD, 1 application at 01/08/20 0848 .  pyridOXINE (B-6) injection 100 mg, 100 mg, Intravenous, Daily, Doonquah, Kofi, MD, 100 mg at 01/08/20 0848 ANALYSIS: A 16 channel recording using standard 10 20 measurements is conducted for 44 minutes.  There is a posterior dominant rhythm of 12 Hz which attenuates with eye opening.  There is significant myogenic and movement artifact seen throughout the recording especially on the right side.  Photic stimulation and hyperventilation are not conducted.  The patient had various episodes of arching of the trunk and movements without electrographic correlates.  There is one episode of generalized delta activity in a semirhythmic pattern.  This was associated with the spindles afterwards and the patient being unresponsive but no clear epileptiform discharges are noted. IMPRESSION: 1.  This study shows an episode of generalized slow-wave delta activity without clear associated epileptiform discharges.  No evidence of epileptiform discharges are noted.  The recording is significantly improved compared to the 2 previous studies. Kofi A. Gerilyn Pilgrim, M.D. Diplomate, Biomedical engineer of Psychiatry and Neurology ( Neurology).   Overnight EEG with video  Result Date: 01/10/2020 Charlsie Quest, MD     01/10/2020  9:22 AM //Patient Name: Anita Davis // MRN: [id number] Epilepsy Attending: Charlsie Quest   Referring Physician/Provider: Dr Ritta Slot Duration: 01/09/2020 1643 to 01/10/2020 0900 Patient history: 34 y.o.femalewith medical history significant forhepatitis C and polysubstance abuse whowas brought to the ED by EMS for altered mental status and found to be in status. EEG to evaluate for seizure. Level of alertness: Awake, asleep AEDs during EEG study: LEV, Vmpat, PHT Technical aspects: This EEG study was done  with scalp electrodes positioned according to the 10-20 International system of electrode placement. Electrical activity was acquired at a sampling rate of 500Hz  and reviewed with a high frequency filter of 70Hz  and a low frequency filter of 1Hz . EEG data were recorded continuously and digitally stored. Description: The posterior dominant rhythm consists of  10 Hz activity of moderate voltage (25-35 uV) seen predominantly in posterior head regions, symmetric and reactive to eye opening and eye closing. Sleep was characterized by vertex waves, sleep spindles (12 to 14 Hz), maximal frontocentral region.  EEG showed intermittent rhythmic 2-3hz  sharply contoured delta activity in left hemisphere which at times shows evolution in amplitude, lasting 2-7 seconds. At times quasi PLEd-like sharp waves in left centrotemporal region were also noted.  Hyperventilation and photic stimulation were not performed.   ABNORMALITY - Sharp waves, left centro-temporal region - Intermittent rhythmic delta activity, lateralized left hemisphere ( LRDA) IMPRESSION: This study showed evidence of epileptogenicity arising from left centrotemporal region as well as cortical dysfunction in left hemisphere. LRDA is on the ictal-interictal continuum and due to evolution in amplitude and sharply contoured morphology, this could be ictal in nature. No definite seizures were seen during this study. EEG appears to have improved compared to prior eegs. Priyanka        Scheduled Meds: . Chlorhexidine Gluconate Cloth  6 each Topical Daily  . enoxaparin (LOVENOX) injection  40 mg Subcutaneous Q24H  . mouth rinse  15 mL Mouth Rinse BID  . mupirocin ointment  1 application Nasal BID  . phenytoin (DILANTIN) IV  100 mg Intravenous Q8H  . pyridOXINE  100 mg Intravenous Daily   Continuous Infusions: . lacosamide (VIMPAT) IV 200 mg (01/10/20 1224)  . levETIRAcetam 1,500 mg (01/10/20 0936)  . methylPREDNISolone (SOLU-MEDROL) injection  1,000 mg (01/10/20 1330)     LOS: 4 days    Time spent: 35 min    Burke Keelshristopher Breland Elders, MD [facility]  If 7PM-7AM, please contact night-coverage  01/10/2020, 1:32 PM

## 2020-01-10 NOTE — Procedures (Addendum)
//  Patient Name: Anita Davis  MRN: [id number]  Epilepsy Attending: Charlsie Quest    Referring Physician/Provider: Dr Ritta Slot Duration: 01/09/2020 1643 to 01/10/2020 1643  Patient history: 34 y.o.femalewith medical history significant forhepatitis C and polysubstance abuse whowas brought to the ED by EMS for altered mental status and found to be in status. EEG to evaluate for seizure.  Level of alertness: Awake, asleep  AEDs during EEG study: LEV, Vmpat, PHT  Technical aspects: This EEG study was done with scalp electrodes positioned according to the 10-20 International system of electrode placement. Electrical activity was acquired at a sampling rate of 500Hz  and reviewed with a high frequency filter of 70Hz  and a low frequency filter of 1Hz . EEG data were recorded continuously and digitally stored.   Description: The posterior dominant rhythm consists of  10 Hz activity of moderate voltage (25-35 uV) seen predominantly in posterior head regions, symmetric and reactive to eye opening and eye closing. Sleep was characterized by vertex waves, sleep spindles (12 to 14 Hz), maximal frontocentral region.  EEG showed intermittent rhythmic 2-3hz  sharply contoured delta activity in left hemisphere which at times shows evolution in amplitude, lasting 2-7 seconds. At times quasi PLEd-like sharp waves in left centrotemporal region were also noted.  Hyperventilation and photic stimulation were not performed.     ABNORMALITY - Sharp waves, left centro-temporal region - Intermittent rhythmic delta activity, lateralized left hemisphere ( LRDA)  IMPRESSION: This study showed evidence of epileptogenicity arising from left centrotemporal region as well as cortical dysfunction in left hemisphere. LRDA is on the ictal-interictal continuum and due to evolution in amplitude and sharply contoured morphology, this could be ictal in nature. No definite seizures were seen during this study.   EEG  appears to have improved compared to prior eegs.    [name] 

## 2020-01-10 NOTE — Progress Notes (Signed)
Floor coverage  Notified by nursing staff that patient had an unwitnessed fall.  She was found on the floor next to her bed.  Patient seen and examined at bedside.  Awake and alert.  Resting comfortably in her bed.  Oriented to person, place, and time.  However, appears very confused and hallucinating.  No history could be obtained from her.  No obvious injuries noted on examination.  Cervical, thoracic, and lumbar spine nontender to palpation.  Normal range of motion of the neck.  She is able to move all extremities on command.  No bruising or deformity noted.  Examination of scalp limited secondary to presence of continuous EEG electrodes.  Pupils equal and reactive to light.  Lungs clear on exam.  Abdomen nontender to palpation.  Per notes from daytime MD, patient was moderately confused during the day and her judgment and insight were severely impaired.  Nursing staff reported that patient was awake, alert, oriented, and following commands during the day.  It is reported that she had a similar episode of confusion/hallucinations last night as well.  -Unclear whether patient sustained any head injuries from the fall.  Stat head CT ordered given altered mental status. -Bed alarm, fall precautions -Continue to monitor closely

## 2020-01-10 NOTE — Progress Notes (Addendum)
Patient had an unwitnessed fall. Per RN Debby Bud,  patient was  on the floor next to her bed. Vital signs within normal limits. Patient is very agitated and hallucinating. No physical injuries noted. Dr. Ulyess Blossom paged and at bedside assessing patient. Received orders for head CT and ativan 1mg .

## 2020-01-10 NOTE — Progress Notes (Signed)
Subjective: Patient is significantly improved this morning.  Exam: Vitals:   01/09/20 2335 01/10/20 0843  BP: 125/87 136/81  Pulse: 82 83  Resp: 15 18  Temp: 98.6 F (37 C) 99.1 F (37.3 C)  SpO2: 96% 96%   Gen: In bed, NAD Resp: non-labored breathing, no acute distress Abd: soft, nt  Neuro: MS: She is awake, alert, able to answer questions, though still with some confusion.  She is able to give me the month and year, but not where she is.  When I ask her about what events led to her hospitalization, she just does not answer, but is not unresponsive as I ask her other questions and she will answer quickly. CN: Pupils equal round and reactive, extraocular movements intact, visual fields full Motor: She moves all extremities with 5/5 strength Sensory: Intact to light touch  Pertinent Labs: TSH, B12 - nml RPR negative ESR, CRP WNL ANA pending  HIV negative   CSF: WBC 1 RBC 0 Protein 19 Glucose 53   Impression: 34 year old female with new onset nonconvulsive status epilepticus.  Given that it was refractory to two agents, I would consider this as new onset refractory status epilepticus (NORSE), though with Ativan/fosphenytoin it did appear to break.  She was likely seizing from her presentation on the 13th until the 18th.  The EEG pattern initially was quite convincing, on connection to EEG yesterday, it was an ictal-interictal pattern, but with clinical improvement after treatment, I think we can say that it was ictal.  The etiology is currently unclear, with normal CSF findings I think that infection is extremely unlikely.  New onset refractory status is often associated with autoimmune causes and therefore I will finish out a full 5 days of IV Solu-Medrol (received third dose yesterday).  Recommendations: 1) continue phenytoin 100 3 times daily 2) continue Keppra 1500 twice daily 3) continue Vimpat 200 mg twice daily 4) Solu-Medrol 1 g for two additional days for total  of 5 days. 5) serum autoimmune epilepsy panel, could consider sending it from CSF if they have enough at [facility]. 6) neurology will continue to follow.  Roland Rack, MD [facility] [phone]  If 7pm- 7am, please page neurology on call as listed in [place].

## 2020-01-10 NOTE — Progress Notes (Signed)
LTM maint complete - no skin breakdown under: Fp1 Fp2 F3 F4 F8 F7. Patient is quite active time required exceed the general allotted. qm

## 2020-01-11 DIAGNOSIS — G9341 Metabolic encephalopathy: Secondary | ICD-10-CM

## 2020-01-11 LAB — HEMOGLOBIN A1C
Hgb A1c MFr Bld: 5.6 % (ref 4.8–5.6)
Mean Plasma Glucose: 114.02 mg/dL

## 2020-01-11 LAB — HOMOCYSTEINE: Homocysteine: 4.4 umol/L (ref 0.0–14.5)

## 2020-01-11 LAB — GLUCOSE, CAPILLARY
Glucose-Capillary: 154 mg/dL — ABNORMAL HIGH (ref 70–99)
Glucose-Capillary: 152 mg/dL — ABNORMAL HIGH (ref 70–99)
Glucose-Capillary: 173 mg/dL — ABNORMAL HIGH (ref 70–99)

## 2020-01-11 LAB — ANTINUCLEAR ANTIBODIES, IFA: ANA Ab, IFA: NEGATIVE

## 2020-01-11 MED ORDER — PANTOPRAZOLE SODIUM 40 MG PO TBEC
40.0000 mg | DELAYED_RELEASE_TABLET | Freq: Every day | ORAL | Status: AC
  Administered 2020-01-11 – 2020-01-12 (×2): 40 mg via ORAL
  Filled 2020-01-11 (×2): qty 1

## 2020-01-11 MED ORDER — INSULIN ASPART 100 UNIT/ML ~~LOC~~ SOLN
0.0000 [IU] | Freq: Three times a day (TID) | SUBCUTANEOUS | Status: DC
  Administered 2020-01-11 (×3): 2 [IU] via SUBCUTANEOUS
  Administered 2020-01-12: 1 [IU] via SUBCUTANEOUS
  Administered 2020-01-12: 2 [IU] via SUBCUTANEOUS
  Administered 2020-01-12: 1 [IU] via SUBCUTANEOUS
  Administered 2020-01-13 – 2020-01-19 (×10): 2 [IU] via SUBCUTANEOUS
  Administered 2020-01-19: 1 [IU] via SUBCUTANEOUS
  Administered 2020-01-20 (×2): 2 [IU] via SUBCUTANEOUS
  Administered 2020-01-20: 1 [IU] via SUBCUTANEOUS
  Administered 2020-01-21 (×2): 2 [IU] via SUBCUTANEOUS
  Administered 2020-01-21: 1 [IU] via SUBCUTANEOUS
  Administered 2020-01-22: 2 [IU] via SUBCUTANEOUS
  Administered 2020-01-22 – 2020-01-25 (×4): 1 [IU] via SUBCUTANEOUS
  Administered 2020-01-25 (×2): 2 [IU] via SUBCUTANEOUS
  Administered 2020-01-26 (×2): 1 [IU] via SUBCUTANEOUS
  Administered 2020-01-26: 3 [IU] via SUBCUTANEOUS
  Administered 2020-01-27: 1 [IU] via SUBCUTANEOUS
  Administered 2020-01-27 – 2020-01-28 (×2): 2 [IU] via SUBCUTANEOUS
  Administered 2020-01-28: 1 [IU] via SUBCUTANEOUS
  Administered 2020-01-29 – 2020-01-31 (×4): 2 [IU] via SUBCUTANEOUS
  Administered 2020-02-01: 1 [IU] via SUBCUTANEOUS
  Administered 2020-02-02: 2 [IU] via SUBCUTANEOUS
  Administered 2020-02-03 (×2): 1 [IU] via SUBCUTANEOUS
  Administered 2020-02-03: 2 [IU] via SUBCUTANEOUS
  Administered 2020-02-04 (×2): 1 [IU] via SUBCUTANEOUS
  Administered 2020-02-04: 2 [IU] via SUBCUTANEOUS
  Administered 2020-02-05: 1 [IU] via SUBCUTANEOUS
  Administered 2020-02-07 (×2): 2 [IU] via SUBCUTANEOUS
  Administered 2020-02-07 – 2020-02-09 (×5): 1 [IU] via SUBCUTANEOUS
  Administered 2020-02-09: 3 [IU] via SUBCUTANEOUS
  Administered 2020-02-10 – 2020-02-12 (×3): 1 [IU] via SUBCUTANEOUS

## 2020-01-11 MED ORDER — INFLUENZA VAC SPLIT QUAD 0.5 ML IM SUSY
0.5000 mL | PREFILLED_SYRINGE | INTRAMUSCULAR | Status: DC
  Filled 2020-01-11 (×3): qty 0.5

## 2020-01-11 NOTE — Plan of Care (Signed)

## 2020-01-11 NOTE — Progress Notes (Addendum)
ID: 34 year old female with new onset nonconvulsive status epilepticus.  Given that it was refractory to two agents, this is new onset refractory status epilepticus (NORSE), though with Ativan/fosphenytoin it did appear to break.  She was likely seizing from her presentation on the 13th until the 18th. Treated with pulse-dose solumedrol for 5 days (end date 9/21)  Subjective: Reports headache occipital area that is improving  No other significant pain anywhere  Exam: Vitals:   01/10/20 2347 01/11/20 0500  BP: 122/80   Pulse: 85   Resp: 20   Temp: 98.3 F (36.8 C) 97.9 F (36.6 C)  SpO2: 97% 100%   Gen: In bed, NAD Resp: non-labored breathing, no acute distress Abd: soft, nt Psych: Affect flat, slow to respond to questions, slowed movements Cardiac: RRR, good radial pulses  Skin: Pock marks throughout extremities   Neuro: MS: She is awake, alert, able to answer questions, though still with some confusion.  When I ask her about what events led to her hospitalization, she shakes her head no, but is not unresponsive as I ask her other questions and she will answer quickly. She reports she had her first seizure at age 58/15 when she was initially committed, and that she regularly takes dilantin (unclear if she ran out or actually takes this), as well as an "injection in my side"  CN: Pupils equal round and reactive, extraocular movements intact, with possible saccadic intrusions, visual fields full, facial sensation intact, will not smile for me, tongue midline, full ROM on head turn Motor: She moves all extremities at least antigravity, slowly, with encouragement Sensory: Intact to light touch Coordination: slow finger to nose but intact bilaterally   Pertinent Labs: TSH, B12 - nml RPR negative ESR, CRP WNL ANA pending  HIV negative   CSF: WBC 1 RBC 0 Protein 19 Glucose 53  Impression: 34 year old female with new onset nonconvulsive status epilepticus.  Given that it was  refractory to two agents, we have considered this as new onset refractory status epilepticus (NORSE), though with Ativan/fosphenytoin it did appear to break.  She was likely seizing from her presentation on the 13th until the 18th, with marked EEG and clinical improvement after treatment.   The etiology is currently unclear, with normal CSF findings infection is extremely unlikely.  New onset refractory status is often associated with autoimmune causes and therefore we will finish out a full 5 days of IV Solu-Medrol (received third dose yesterday). Unclear if she truly has prior seizure history of if this is secondary to her general post-ictal confusion, which is expected to be slow to clear given duration of her seizures.   Recommendations: - continue phenytoin 100 3 times daily - continue Keppra 1500 twice daily - continue Vimpat 200 mg twice daily - Solu-Medrol 1 g for two additional days for total of 5 days.  - SSI at mealtimes while on high dose steroids  - Protonix 40 mg daily while on high dose steroids  - Will not continue LTM at this time, follow clinical exam  Following pending labs:  - serum autoimmune epilepsy panel, could consider sending it from CSF if they have enough at [facility]. - CSF HSV - ANA  Neurology will continue to follow  [place] [phone]    If 7pm- 7am, please page neurology on call as listed in [place].

## 2020-01-11 NOTE — Procedures (Addendum)
Patient Name: Anita Davis  MRN: [id number]  Epilepsy Attending: Charlsie Quest    Referring Physician/Provider: Dr Ritta Slot Duration: 01/10/2020 1643 to 01/10/2020 2100  Patient history: 34 y.o.femalewith medical history significant forhepatitis C and polysubstance abuse whowas brought to the ED by EMS for altered mental status and found to be in status.EEG to evaluate for seizure.  Level of alertness: Awake, asleep  AEDs during EEG study: LEV, Vmpat, PHT  Technical aspects: This EEG study was done with scalp electrodes positioned according to the 10-20 International system of electrode placement. Electrical activity was acquired at a sampling rate of 500Hz  and reviewed with a high frequency filter of 70Hz  and a low frequency filter of 1Hz . EEG data were recorded continuously and digitally stored.   Description: The posterior dominant rhythm consists of  10 Hz activity of moderate voltage (25-35 uV) seen predominantly in posterior head regions, symmetric and reactive to eye opening and eye closing. Sleep was characterized by vertex waves, sleep spindles (12 to 14 Hz), maximal frontocentral region. Sharp waves in left centrotemporal region were also noted.  Hyperventilation and photic stimulation were not performed.    Of note, EEG was difficult to interpret after 21000 on 01/10/2020 due to significant electrode artifact.  ABNORMALITY - Sharp waves, left centro-temporal region  IMPRESSION: This study showed evidence of epileptogenicity arising from left centrotemporal region. No definite seizures were seen during this study.    Anita Davis 

## 2020-01-11 NOTE — Progress Notes (Signed)
PROGRESS NOTE  Anita Davis ZOX:[id number] DOB: Jun 03, 1985 DOA: 01/04/2020 PCP: Jacquelin HawkingMcElroy, Shannon, PA-C  HPI/Recap of past 7124 hours: 34 year old female with past medical history of hepatitis C and polysubstance abuse brought to the hospital on 9/13 for disorientation which was suspected to be substance abuse.  After she became more lucid, she was discharged, but then police brought her back later in the day and they found her passed out in a parking lot.  The emergency room, urine drug screen positive for benzodiazepines and THC although negative for methamphetamine, which patient had reportedly been taking in the past according to family.  Work-up in the emergency room otherwise unrevealing.  Patient required several doses of IV Ativan to control her agitation.  Because of her persistent altered confusion, neurology was consulted and patient was admitted to the hospitalist service.  She underwent lumbar puncture which was unrevealing.  An EEG was done on 9/15 noting the patient was in status epilepticus.  Patient was started on Keppra.  Despite this, EEG noted continued seizures requiring additional medications as well.  Finally on 9/18, seizures broke using Ativan and fosphenytoin plus Keppra and Vimpat and Solu-Medrol.  Neurology feels that patient has likely been seizing from 8/13-8/18.  She is starting to improve somewhat in her mentation.  Today, she is slightly tearful on not fully alert and interactive yet.  Assessment/Plan: Principal Problem:   Acute metabolic encephalopathy secondary to persistent refractory continuous seizures.  On fosphenytoin, Keppra, Vimpat and receiving Solu-Medrol for possible autoimmune cause which can be seen with new onset refractory seizures.  Greatly appreciate neurology help.  Continue Solu-Medrol with last dose today.  Patient had follow-up EEG done today which noted evidence of epileptogenicity arising from the left central temporal region.  Continue  antiepileptics. Active Problems:   Hypokalemia: Secondary dehydration.  Replaced.   Polysubstance abuse (HCC): 1 more alert, will counsel.    Elevated CK: Mild elevation in the setting of seizures.    Transaminitis Overweight: Patient meets criteria BMI greater than 25  Hyperglycemia: A1c 4 months ago at 5.9.  Has had episodes of higher blood sugars here.  Will recheck A1c.  On sliding scale.   Code Status: Full code  Family Communication: Left message for family  Disposition Plan: Hopefully home as her mentation improves.  Will start to get PT involved.   Consultants:  Neurology  Procedures:  Multiple EEGs  Antimicrobials:  None  DVT prophylaxis: Lovenox   Objective: Vitals:   01/11/20 0835 01/11/20 1211  BP: 115/86 (!) 137/95  Pulse: 96 81  Resp: (!) 25 15  Temp:  98.4 F (36.9 C)  SpO2: 100% 98%    Intake/Output Summary (Last 24 hours) at 01/11/2020 1253 Last data filed at 01/10/2020 1800 Gross per 24 hour  Intake 246 ml  Output --  Net 246 ml   Filed Weights   01/06/20 1251 01/06/20 1710 01/07/20 0500  Weight: 74.8 kg 71.2 kg 70.6 kg   Body mass index is 25.9 kg/m.  Exam:   General: Awake, somewhat interactive, but still confused  HEENT: Normocephalic and atraumatic, mucous membranes are slightly dry  Cardiovascular: Regular rhythm, borderline tachycardia  Respiratory: Clear to auscultation bilaterally  Abdomen: Soft nontender, nondistended, positive bowel sounds  Musculoskeletal: No clubbing or cyanosis or edema  Skin: No skin breaks, tears or lesions  Psychiatry: There was some underlying confusion.   Data Reviewed: CBC: Recent Labs  Lab 01/04/20 1651 01/06/20 0610 01/07/20 0312 01/08/20 0622 01/10/20 1825  WBC 7.4  4.0 4.0 2.0* 4.0  NEUTROABS 4.8  --  1.1* 0.9* 2.9  HGB 16.0* 13.8 14.4 15.5* 14.1  HCT 47.7* 41.6 44.4 44.6 40.6  MCV 91.6 94.3 95.1 89.2 87.9  PLT 278 205 196 223 225   Basic Metabolic Panel: Recent  Labs  Lab 01/05/20 1122 01/06/20 0610 01/07/20 0312 01/08/20 0622 01/10/20 1825  NA 142 141 137 139 140  K 3.3* 3.6 3.4* 3.6 3.6  CL 105 108 104 104 106  CO2 25 21* 22 21* 23  GLUCOSE 105* 79 82 162* 182*  BUN 31* 25* 15 22* 20  CREATININE 0.98 0.75 0.60 0.72 0.78  CALCIUM 9.2 8.7* 8.8* 9.2 9.1  MG  --   --  2.3  --   --    GFR: Estimated Creatinine Clearance: 95.8 mL/min (by C-G formula based on SCr of 0.78 mg/dL). Liver Function Tests: Recent Labs  Lab 01/04/20 1651 01/06/20 0610 01/08/20 0622 01/10/20 1825  AST 129* 167* 126* 191*  ALT 121* 141* 163* 362*  ALKPHOS 60 50 51 44  BILITOT 1.1 1.1 1.1 1.2  PROT 9.3* 7.9 8.5* 7.5  ALBUMIN 4.8 4.0 4.1 3.9   No results for input(s): LIPASE, AMYLASE in the last 168 hours. No results for input(s): AMMONIA in the last 168 hours. Coagulation Profile: Recent Labs  Lab 01/06/20 0610  INR 1.1   Cardiac Enzymes: Recent Labs  Lab 01/04/20 1651  CKTOTAL 324*   BNP (last 3 results) No results for input(s): PROBNP in the last 8760 hours. HbA1C: No results for input(s): HGBA1C in the last 72 hours. CBG: Recent Labs  Lab 01/07/20 1636 01/08/20 0252 01/11/20 0839 01/11/20 1219  GLUCAP 78 135* 154* 152*   Lipid Profile: No results for input(s): CHOL, HDL, LDLCALC, TRIG, CHOLHDL, LDLDIRECT in the last 72 hours. Thyroid Function Tests: No results for input(s): TSH, T4TOTAL, FREET4, T3FREE, THYROIDAB in the last 72 hours. Anemia Panel: No results for input(s): VITAMINB12, FOLATE, FERRITIN, TIBC, IRON, RETICCTPCT in the last 72 hours. Urine analysis:    Component Value Date/Time   COLORURINE YELLOW 01/09/2020 1356   APPEARANCEUR CLOUDY (A) 01/09/2020 1356   LABSPEC 1.018 01/09/2020 1356   PHURINE 8.0 01/09/2020 1356   GLUCOSEU NEGATIVE 01/09/2020 1356   HGBUR NEGATIVE 01/09/2020 1356   BILIRUBINUR NEGATIVE 01/09/2020 1356   KETONESUR 20 (A) 01/09/2020 1356   PROTEINUR NEGATIVE 01/09/2020 1356   NITRITE NEGATIVE  01/09/2020 1356   LEUKOCYTESUR NEGATIVE 01/09/2020 1356   Sepsis Labs: @LABRCNTIP (procalcitonin:4,lacticidven:4)  ) Recent Results (from the past 240 hour(s))  SARS Coronavirus 2 by RT PCR (hospital order, performed in [facility] hospital lab) Nasopharyngeal Nasopharyngeal Swab     Status: None   Collection Time: 01/05/20 10:08 AM   Specimen: Nasopharyngeal Swab  Result Value Ref Range Status   SARS Coronavirus 2 NEGATIVE NEGATIVE Final    Comment: (NOTE) SARS-CoV-2 target nucleic acids are NOT DETECTED.  The SARS-CoV-2 RNA is generally detectable in upper and lower respiratory specimens during the acute phase of infection. The lowest concentration of SARS-CoV-2 viral copies this assay can detect is 250 copies / mL. A negative result does not preclude SARS-CoV-2 infection and should not be used as the sole basis for treatment or other patient management decisions.  A negative result may occur with improper specimen collection / handling, submission of specimen other than nasopharyngeal swab, presence of viral mutation(s) within the areas targeted by this assay, and inadequate number of viral copies (<250 copies / mL). A negative  result must be combined with clinical observations, patient history, and epidemiological information.  Fact Sheet for Patients:   BoilerBrush.com.cy  Fact Sheet for Healthcare Providers: https://pope.com/  This test is not yet approved or  cleared by the [place] FDA and has been authorized for detection and/or diagnosis of SARS-CoV-2 by FDA under an Emergency Use Authorization (EUA).  This EUA will remain in effect (meaning this test can be used) for the duration of the COVID-19 declaration under Section 564(b)(1) of the Act, 21 U.S.C. section 360bbb-3(b)(1), unless the authorization is terminated or revoked sooner.  Performed at [facility], [address] [place], [place] [address]   CSF  culture     Status: None   Collection Time: 01/05/20  6:28 PM   Specimen: Lumbar Puncture; Cerebrospinal Fluid  Result Value Ref Range Status   Specimen Description   Final    LUMBAR Performed at [facility], [address], [place], [place] [address]    Special Requests   Final    NONE Performed at [facility], [address] [place], [place] [address]    Culture   Final    NO GROWTH Performed at [facility], [address], [place], [place] [address]    Report Status 01/09/2020 FINAL  Final  Blood culture (routine x 2)     Status: None   Collection Time: 01/05/20  9:15 PM   Specimen: BLOOD LEFT HAND  Result Value Ref Range Status   Specimen Description BLOOD LEFT HAND  Final   Special Requests   Final    BOTTLES DRAWN AEROBIC AND ANAEROBIC Blood Culture adequate volume   Culture   Final    NO GROWTH 5 DAYS Performed at [facility], [address], [place], [place] [address]    Report Status 01/10/2020 FINAL  Final  Blood culture (routine x 2)     Status: None   Collection Time: 01/05/20  9:18 PM   Specimen: BLOOD LEFT ARM  Result Value Ref Range Status   Specimen Description BLOOD LEFT ARM  Final   Special Requests   Final    BOTTLES DRAWN AEROBIC AND ANAEROBIC Blood Culture adequate volume   Culture   Final    NO GROWTH 5 DAYS Performed at [facility], [address] [place], [place] [address]    Report Status 01/10/2020 FINAL  Final  Gram stain     Status: None   Collection Time: 01/05/20  9:24 PM   Specimen: CSF  Result Value Ref Range Status   Specimen Description CSF  Final   Special Requests NONE  Final   Gram Stain   Final    CYTOSPIN SMEAR NO WBC SEEN NO ORGANISMS SEEN Performed at [facility], [address] [place], [place] [address]    Report Status 01/05/2020 FINAL  Final  MRSA PCR Screening     Status: Abnormal   Collection Time: 01/06/20  5:12 PM   Specimen: Nasal Mucosa; Nasopharyngeal  Result Value Ref Range Status   MRSA by PCR  POSITIVE (A) NEGATIVE Corrected    Comment: TETREAULT,H @0638  BY HUFFINES,S 9.16.2021         The GeneXpert MRSA Assay (FDA approved for NASAL specimens only), is one component of a comprehensive MRSA colonization surveillance program. It is not intended to diagnose MRSA infection nor to guide or monitor treatment for MRSA infections. Performed at [facility], [address], [place], [place] CORRECTED ON 09/16 AT 0654: PREVIOUSLY REPORTED AS POSITIVE  The GeneXpert MRSA Assay (FDA approved for NASAL specimens only), is one component of a comprehensive MRSA colonization surveillance program. It is not intended to diagnose MRSA infection  nor to guide or monitor treatment for MRSA infections. RESULT CALLED TO, READ BACK BY AND VERIFIED WITH: TETREAULT,H       Studies: CT HEAD WO CONTRAST  Result Date: 01/10/2020 CLINICAL DATA:  Fall with head trauma EXAM: CT HEAD WITHOUT CONTRAST TECHNIQUE: Contiguous axial images were obtained from the base of the skull through the vertex without intravenous contrast. COMPARISON:  None. FINDINGS: Brain: There is no mass, hemorrhage or extra-axial collection. The size and configuration of the ventricles and extra-axial CSF spaces are normal. The brain parenchyma is normal, without acute or chronic infarction. Vascular: No abnormal hyperdensity of the major intracranial arteries or dural venous sinuses. No intracranial atherosclerosis. Skull: The visualized skull base, calvarium and extracranial soft tissues are normal. Sinuses/Orbits: No fluid levels or advanced mucosal thickening of the visualized paranasal sinuses. No mastoid or middle ear effusion. The orbits are normal. IMPRESSION: Normal head CT. Electronically Signed   By: Deatra Robinson M.D.   On: 01/10/2020 21:48    Scheduled Meds: . Chlorhexidine Gluconate Cloth  6 each Topical Daily  . enoxaparin (LOVENOX) injection  40 mg Subcutaneous Q24H  . [START ON 01/12/2020] influenza vac  split quadrivalent PF  0.5 mL Intramuscular Tomorrow-1000  . insulin aspart  0-9 Units Subcutaneous TID WC  . mouth rinse  15 mL Mouth Rinse BID  . mupirocin ointment  1 application Nasal BID  . pantoprazole  40 mg Oral Daily  . phenytoin (DILANTIN) IV  100 mg Intravenous Q8H  . pyridOXINE  100 mg Intravenous Daily    Continuous Infusions: . lacosamide (VIMPAT) IV 200 mg (01/11/20 1107)  . levETIRAcetam 1,500 mg (01/11/20 0828)     LOS: 5 days     Hollice Espy, MD [facility]   01/11/2020, 12:53 PM

## 2020-01-11 NOTE — Progress Notes (Signed)
Pt pulled her EEG leads off during the night. Tech cleaned patients head and removed the equipment out of room. No skin breakdown was seen

## 2020-01-12 ENCOUNTER — Inpatient Hospital Stay (HOSPITAL_COMMUNITY): Payer: Medicaid Other

## 2020-01-12 LAB — COMPREHENSIVE METABOLIC PANEL
ALT: 318 U/L — ABNORMAL HIGH (ref 0–44)
AST: 165 U/L — ABNORMAL HIGH (ref 15–41)
Albumin: 3.7 g/dL (ref 3.5–5.0)
Alkaline Phosphatase: 41 U/L (ref 38–126)
Anion gap: 10 (ref 5–15)
BUN: 32 mg/dL — ABNORMAL HIGH (ref 6–20)
CO2: 23 mmol/L (ref 22–32)
Calcium: 9.1 mg/dL (ref 8.9–10.3)
Chloride: 110 mmol/L (ref 98–111)
Creatinine, Ser: 0.83 mg/dL (ref 0.44–1.00)
GFR calc Af Amer: >60 mL/min (ref >60)
GFR calc non Af Amer: >60 mL/min (ref >60)
Glucose, Bld: 136 mg/dL — ABNORMAL HIGH (ref 70–99)
Potassium: 3.5 mmol/L (ref 3.5–5.1)
Sodium: 143 mmol/L (ref 135–145)
Total Bilirubin: 1.2 mg/dL (ref 0.3–1.2)
Total Protein: 6.8 g/dL (ref 6.5–8.1)

## 2020-01-12 LAB — GLUCOSE, CAPILLARY
Glucose-Capillary: 142 mg/dL — ABNORMAL HIGH (ref 70–99)
Glucose-Capillary: 140 mg/dL — ABNORMAL HIGH (ref 70–99)
Glucose-Capillary: 155 mg/dL — ABNORMAL HIGH (ref 70–99)
Glucose-Capillary: 121 mg/dL — ABNORMAL HIGH (ref 70–99)

## 2020-01-12 NOTE — Progress Notes (Signed)
vLTM EEG Started. Notified Neuro 

## 2020-01-12 NOTE — Plan of Care (Signed)
Pt is alert but has been nonverbal but follow commands. EEG in place. Tele in place. Pt has not used the bathroom but will bladder scan at midnight.  Problem: Education: Goal: Knowledge of General Education information will improve Description: Including pain rating scale, medication(s)/side effects and non-pharmacologic comfort measures Outcome: Progressing   Problem: Health Behavior/Discharge Planning: Goal: Ability to manage health-related needs will improve Outcome: Progressing   Problem: Clinical Measurements: Goal: Ability to maintain clinical measurements within normal limits will improve Outcome: Progressing Goal: Will remain free from infection Outcome: Progressing Goal: Diagnostic test results will improve Outcome: Progressing Goal: Respiratory complications will improve Outcome: Progressing Goal: Cardiovascular complication will be avoided Outcome: Progressing   Problem: Activity: Goal: Risk for activity intolerance will decrease Outcome: Progressing   Problem: Nutrition: Goal: Adequate nutrition will be maintained Outcome: Progressing   Problem: Coping: Goal: Level of anxiety will decrease Outcome: Progressing   Problem: Elimination: Goal: Will not experience complications related to bowel motility Outcome: Progressing Goal: Will not experience complications related to urinary retention Outcome: Progressing   Problem: Pain Managment: Goal: General experience of comfort will improve Outcome: Progressing   Problem: Safety: Goal: Ability to remain free from injury will improve Outcome: Progressing   Problem: Skin Integrity: Goal: Risk for impaired skin integrity will decrease Outcome: Progressing

## 2020-01-12 NOTE — Progress Notes (Signed)
PROGRESS NOTE  Anita Davis QQV:[id number] DOB: 1986/04/21 DOA: 01/04/2020 PCP: [name], PA-C  HPI/Recap of past 37 hours: 34 year old female with past medical history of hepatitis C and polysubstance abuse brought to the hospital on 9/13 for disorientation which was suspected to be substance abuse.  After she became more lucid, she was discharged, but then police brought her back later in the day and they found her passed out in a parking lot.  The emergency room, urine drug screen positive for benzodiazepines and THC although negative for methamphetamine, which patient had reportedly been taking in the past according to family.  Work-up in the emergency room otherwise unrevealing.  Patient required several doses of IV Ativan to control her agitation.  Because of her persistent altered confusion, neurology was consulted and patient was admitted to the hospitalist service.  She underwent lumbar puncture which was unrevealing.  An EEG was done on 9/15 noting the patient was in status epilepticus.  Patient was started on Keppra.  Despite this, EEG noted continued seizures requiring additional medications as well.  Finally on 9/18, seizures broke using Ativan and fosphenytoin plus Keppra and Vimpat and Solu-Medrol.  Neurology feels that patient has likely been seizing from 8/13-8/18.  She is starting to improve somewhat in her mentation.  Patient a little bit more interactive today.  She still does not verbalize for me although reports are that she does verbalize for others.  No acute distress.  Shakes head when asked about pain or breathing.  Assessment/Plan: Principal Problem:   Acute metabolic encephalopathy secondary to persistent refractory continuous seizures.  On fosphenytoin, Keppra, Vimpat and receiving Solu-Medrol for possible autoimmune cause which can be seen with new onset refractory seizures.  Greatly appreciate neurology help.  Continue Solu-Medrol with last dose today.  Patient  had follow-up EEG done today which noted evidence of epileptogenicity arising from the left central temporal region.  Continue antiepileptics. Active Problems:   Hypokalemia: Secondary dehydration.  Replaced.   Polysubstance abuse (HCC): Once she is more alert, will counsel.    Elevated CK: Mild elevation in the setting of seizures.    Transaminitis Overweight: Patient meets criteria BMI greater than 25  Hyperglycemia: A1c here at 5.6.  Has had episodes of higher blood sugars here, while on steroids.  Continue sliding scale although sugar should improve now that steroids have been discontinued   Code Status: Full code  Family Communication: Left message for family  Disposition Plan: Hopefully home as her mentation improves.  Will start to get PT involved.   Consultants:  Neurology  Procedures:  Multiple EEGs  Antimicrobials:  None  DVT prophylaxis: Lovenox   Objective: Vitals:   01/12/20 0800 01/12/20 1134  BP: 126/81 125/69  Pulse: 96 97  Resp: 14 16  Temp: 98.6 F (37 C) 99.8 F (37.7 C)  SpO2: 98% 98%    Intake/Output Summary (Last 24 hours) at 01/12/2020 1206 Last data filed at 01/12/2020 1100 Gross per 24 hour  Intake --  Output 1520 ml  Net -1520 ml   Filed Weights   01/06/20 1251 01/06/20 1710 01/07/20 0500  Weight: 74.8 kg 71.2 kg 70.6 kg   Body mass index is 25.9 kg/m.  Exam:   General: Awake, somewhat interactive, but still confused.  Does not verbalize for me  HEENT: Normocephalic and atraumatic, mucous membranes are slightly dry  Cardiovascular: Regular rate and rhythm, S1-S2  Respiratory: Clear to auscultation bilaterally  Abdomen: Soft nontender, nondistended, positive bowel sounds  Musculoskeletal: No  clubbing or cyanosis or edema  Skin: No skin breaks, tears or lesions  Psychiatry: Still some slow mentation   Data Reviewed: CBC: Recent Labs  Lab 01/06/20 0610 01/07/20 0312 01/08/20 0622 01/10/20 1825  WBC 4.0 4.0  2.0* 4.0  NEUTROABS  --  1.1* 0.9* 2.9  HGB 13.8 14.4 15.5* 14.1  HCT 41.6 44.4 44.6 40.6  MCV 94.3 95.1 89.2 87.9  PLT 205 196 223 225   Basic Metabolic Panel: Recent Labs  Lab 01/06/20 0610 01/07/20 0312 01/08/20 0622 01/10/20 1825 01/12/20 0456  NA 141 137 139 140 143  K 3.6 3.4* 3.6 3.6 3.5  CL 108 104 104 106 110  CO2 21* 22 21* 23 23  GLUCOSE 79 82 162* 182* 136*  BUN 25* 15 22* 20 32*  CREATININE 0.75 0.60 0.72 0.78 0.83  CALCIUM 8.7* 8.8* 9.2 9.1 9.1  MG  --  2.3  --   --   --    GFR: Estimated Creatinine Clearance: 92.3 mL/min (by C-G formula based on SCr of 0.83 mg/dL). Liver Function Tests: Recent Labs  Lab 01/06/20 0610 01/08/20 0622 01/10/20 1825 01/12/20 0456  AST 167* 126* 191* 165*  ALT 141* 163* 362* 318*  ALKPHOS 50 51 44 41  BILITOT 1.1 1.1 1.2 1.2  PROT 7.9 8.5* 7.5 6.8  ALBUMIN 4.0 4.1 3.9 3.7   No results for input(s): LIPASE, AMYLASE in the last 168 hours. No results for input(s): AMMONIA in the last 168 hours. Coagulation Profile: Recent Labs  Lab 01/06/20 0610  INR 1.1   Cardiac Enzymes: No results for input(s): CKTOTAL, CKMB, CKMBINDEX, TROPONINI in the last 168 hours. BNP (last 3 results) No results for input(s): PROBNP in the last 8760 hours. HbA1C: Recent Labs    01/10/20 1825  HGBA1C 5.6   CBG: Recent Labs  Lab 01/08/20 0252 01/11/20 0839 01/11/20 1219 01/11/20 1533 01/12/20 0618  GLUCAP 135* 154* 152* 173* 142*   Lipid Profile: No results for input(s): CHOL, HDL, LDLCALC, TRIG, CHOLHDL, LDLDIRECT in the last 72 hours. Thyroid Function Tests: No results for input(s): TSH, T4TOTAL, FREET4, T3FREE, THYROIDAB in the last 72 hours. Anemia Panel: No results for input(s): VITAMINB12, FOLATE, FERRITIN, TIBC, IRON, RETICCTPCT in the last 72 hours. Urine analysis:    Component Value Date/Time   COLORURINE YELLOW 01/09/2020 1356   APPEARANCEUR CLOUDY (A) 01/09/2020 1356   LABSPEC 1.018 01/09/2020 1356   PHURINE 8.0  01/09/2020 1356   GLUCOSEU NEGATIVE 01/09/2020 1356   HGBUR NEGATIVE 01/09/2020 1356   BILIRUBINUR NEGATIVE 01/09/2020 1356   KETONESUR 20 (A) 01/09/2020 1356   PROTEINUR NEGATIVE 01/09/2020 1356   NITRITE NEGATIVE 01/09/2020 1356   LEUKOCYTESUR NEGATIVE 01/09/2020 1356   Sepsis Labs: @LABRCNTIP (procalcitonin:4,lacticidven:4)  ) Recent Results (from the past 240 hour(s))  SARS Coronavirus 2 by RT PCR (hospital order, performed in [facility] hospital lab) Nasopharyngeal Nasopharyngeal Swab     Status: None   Collection Time: 01/05/20 10:08 AM   Specimen: Nasopharyngeal Swab  Result Value Ref Range Status   SARS Coronavirus 2 NEGATIVE NEGATIVE Final    Comment: (NOTE) SARS-CoV-2 target nucleic acids are NOT DETECTED.  The SARS-CoV-2 RNA is generally detectable in upper and lower respiratory specimens during the acute phase of infection. The lowest concentration of SARS-CoV-2 viral copies this assay can detect is 250 copies / mL. A negative result does not preclude SARS-CoV-2 infection and should not be used as the sole basis for treatment or other patient management decisions.  A  negative result may occur with improper specimen collection / handling, submission of specimen other than nasopharyngeal swab, presence of viral mutation(s) within the areas targeted by this assay, and inadequate number of viral copies (<250 copies / mL). A negative result must be combined with clinical observations, patient history, and epidemiological information.  Fact Sheet for Patients:   BoilerBrush.com.cyhttps://www.fda.gov/media/136312/download  Fact Sheet for Healthcare Providers: https://pope.com/https://www.fda.gov/media/136313/download  This test is not yet approved or  cleared by the [place] FDA and has been authorized for detection and/or diagnosis of SARS-CoV-2 by FDA under an Emergency Use Authorization (EUA).  This EUA will remain in effect (meaning this test can be used) for the duration of the COVID-19  declaration under Section 564(b)(1) of the Act, 21 U.S.C. section 360bbb-3(b)(1), unless the authorization is terminated or revoked sooner.  Performed at [facility], [address] [place], [place] [address]   CSF culture     Status: None   Collection Time: 01/05/20  6:28 PM   Specimen: Lumbar Puncture; Cerebrospinal Fluid  Result Value Ref Range Status   Specimen Description   Final    LUMBAR Performed at [facility], [address] [place], [place] [address]    Special Requests   Final    NONE Performed at [facility], [address] [place], [place] [address]    Culture   Final    NO GROWTH Performed at [facility], [address] [place], [place] [address]    Report Status 01/09/2020 FINAL  Final  Blood culture (routine x 2)     Status: None   Collection Time: 01/05/20  9:15 PM   Specimen: BLOOD LEFT HAND  Result Value Ref Range Status   Specimen Description BLOOD LEFT HAND  Final   Special Requests   Final    BOTTLES DRAWN AEROBIC AND ANAEROBIC Blood Culture adequate volume   Culture   Final    NO GROWTH 5 DAYS Performed at [facility], [address] [place], [place] [address]    Report Status 01/10/2020 FINAL  Final  Blood culture (routine x 2)     Status: None   Collection Time: 01/05/20  9:18 PM   Specimen: BLOOD LEFT ARM  Result Value Ref Range Status   Specimen Description BLOOD LEFT ARM  Final   Special Requests   Final    BOTTLES DRAWN AEROBIC AND ANAEROBIC Blood Culture adequate volume   Culture   Final    NO GROWTH 5 DAYS Performed at [facility], [address] [place], [place] [address]    Report Status 01/10/2020 FINAL  Final  Gram stain     Status: None   Collection Time: 01/05/20  9:24 PM   Specimen: CSF  Result Value Ref Range Status   Specimen Description CSF  Final   Special Requests NONE  Final   Gram Stain   Final    CYTOSPIN SMEAR NO WBC SEEN NO ORGANISMS SEEN Performed at [facility], [address] [place],  [place] [address]    Report Status 01/05/2020 FINAL  Final  MRSA PCR Screening     Status: Abnormal   Collection Time: 01/06/20  5:12 PM   Specimen: Nasal Mucosa; Nasopharyngeal  Result Value Ref Range Status   MRSA by PCR POSITIVE (A) NEGATIVE Corrected    Comment: TETREAULT,H @0638  BY HUFFINES,S 9.16.2021         The GeneXpert MRSA Assay (FDA approved for NASAL specimens only), is one component of a comprehensive MRSA  colonization surveillance program. It is not intended to diagnose MRSA infection nor to guide or monitor treatment for MRSA infections. Performed at [facility], [address] [place], [place] [address] CORRECTED ON 09/16 AT 0654: PREVIOUSLY REPORTED AS POSITIVE        The GeneXpert MRSA Assay (FDA approved for NASAL specimens only), is one component of a comprehensive MRSA colonization surveillance program. It is not intended to diagnose MRSA infection  nor to guide or monitor treatment for MRSA infections. RESULT CALLED TO, READ BACK BY AND VERIFIED WITH: TETREAULT,H       Studies: No results found.  Scheduled Meds: . Chlorhexidine Gluconate Cloth  6 each Topical Daily  . enoxaparin (LOVENOX) injection  40 mg Subcutaneous Q24H  . influenza vac split quadrivalent PF  0.5 mL Intramuscular Tomorrow-1000  . insulin aspart  0-9 Units Subcutaneous TID WC  . mouth rinse  15 mL Mouth Rinse BID  . phenytoin (DILANTIN) IV  100 mg Intravenous Q8H  . pyridOXINE  100 mg Intravenous Daily    Continuous Infusions: . lacosamide (VIMPAT) IV 200 mg (01/12/20 0937)  . levETIRAcetam 1,500 mg (01/12/20 0737)     LOS: 6 days     Hollice Espy, MD [facility]   01/12/2020, 12:06 PM

## 2020-01-12 NOTE — Progress Notes (Addendum)
ID: 34-year-old female with new onset nonconvulsive status epilepticus.  Given that it was refractory to two agents, this is new onset refractory status epilepticus (NORSE), though with Ativan/fosphenytoin it did appear to break.  She was likely seizing from her presentation on the 13th until the 18th. Treated with pulse-dose solumedrol for 5 days (end date 9/21)  Subjective: Non-verbal again today  Exam: Vitals:   01/12/20 1134 01/12/20 1517  BP: 125/69 (!) 114/92  Pulse: 97 90  Resp: 16   Temp: 99.8 F (37.7 C) 98.4 F (36.9 C)  SpO2: 98% 99%   Gen: In bed, generally appears uncomfortable Resp: non-labored breathing, no acute distress Abd: soft, nt, appears distended, Psych: Affect flat,  Cardiac: RRR, good radial pulses  Skin: Pock marks throughout extremities   Neuro: MS: Tracks examiner, sticks out tongue but otherwise doesn't follow commands  CN: Pupils equal round and reactive, extraocular movements intact, with possible saccadic intrusions, visual fields full, facial sensation grossly intact, grimace symmetric, tongue midline, full ROM on head turn Motor: She moves all extremities at least antigravity, slowly, with encouragement Sensory: Intact to light touch throughout  Coordination: Unable to assess secondary to patient's mental status   Pertinent Labs: TSH, B12 - nml RPR negative ESR, CRP WNL ANA pending  HIV negative  CSF: WBC 1 RBC 0 Protein 19 Glucose 53  LFTs this AM stable elevation of AST and ALT, BUN rising to 32 today 9/21 ANA negative  Impression: 34-year-old female with new onset nonconvulsive status epilepticus.  Given that it was refractory to two agents, we have considered this as new onset refractory status epilepticus (NORSE), though with Ativan/fosphenytoin it did appear to break.  She was likely seizing from her presentation on the 13th until the 18th, with marked EEG and clinical improvement after treatment.   The etiology is currently  unclear, with normal CSF findings infection is extremely unlikely.  New onset refractory status is often associated with autoimmune causes and therefore we will finish out a full 5 days of IV Solu-Medrol (received last dose today).   Today her mental status is markedly worse, could be delirium / metabolic given rising BUN.  Recommendations: - continue phenytoin 100 3 times daily - continue Keppra 1500 twice daily - continue Vimpat 200 mg twice daily - Solu-Medrol 1 g daily completed today for 5 day course  - SSI at mealtimes while on high dose steroids  - Protonix 40 mg daily while on high dose steroids  - STAT EEG to rule out NCSE given similar presentation earlier  Following pending labs:  - serum autoimmune epilepsy panel, never collected, re-ordered to be collected tomorrow  - reach out to [facility] to send out from CSF collected there. - CSF HSV - HIV added on 9/21   Workup of BUN per primary team  Neurology will continue to follow    MD-PhD [facility] [phone]    If 7pm- 7am, please page neurology on call as listed in AMION.  

## 2020-01-13 ENCOUNTER — Ambulatory Visit: Payer: Self-pay | Admitting: Physician Assistant

## 2020-01-13 DIAGNOSIS — B182 Chronic viral hepatitis C: Secondary | ICD-10-CM | POA: Diagnosis present

## 2020-01-13 DIAGNOSIS — G40919 Epilepsy, unspecified, intractable, without status epilepticus: Secondary | ICD-10-CM

## 2020-01-13 LAB — CBC WITH DIFFERENTIAL/PLATELET
Abs Immature Granulocytes: 0.07 10*3/uL (ref 0.00–0.07)
Basophils Absolute: 0 10*3/uL (ref 0.0–0.1)
Basophils Relative: 0 %
Eosinophils Absolute: 0 10*3/uL (ref 0.0–0.5)
Eosinophils Relative: 0 %
HCT: 41 % (ref 36.0–46.0)
Hemoglobin: 14.3 g/dL (ref 12.0–15.0)
Immature Granulocytes: 2 %
Lymphocytes Relative: 49 %
Lymphs Abs: 1.5 10*3/uL (ref 0.7–4.0)
MCH: 31.2 pg (ref 26.0–34.0)
MCHC: 34.9 g/dL (ref 30.0–36.0)
MCV: 89.3 fL (ref 80.0–100.0)
Monocytes Absolute: 0.3 10*3/uL (ref 0.1–1.0)
Monocytes Relative: 10 %
Neutro Abs: 1.2 10*3/uL — ABNORMAL LOW (ref 1.7–7.7)
Neutrophils Relative %: 39 %
Platelets: 211 10*3/uL (ref 150–400)
RBC: 4.59 MIL/uL (ref 3.87–5.11)
RDW: 11.8 % (ref 11.5–15.5)
WBC: 3.2 10*3/uL — ABNORMAL LOW (ref 4.0–10.5)
nRBC: 0 % (ref 0.0–0.2)

## 2020-01-13 LAB — GLUCOSE, CAPILLARY
Glucose-Capillary: 117 mg/dL — ABNORMAL HIGH (ref 70–99)
Glucose-Capillary: 164 mg/dL — ABNORMAL HIGH (ref 70–99)
Glucose-Capillary: 97 mg/dL (ref 70–99)
Glucose-Capillary: 106 mg/dL — ABNORMAL HIGH (ref 70–99)

## 2020-01-13 LAB — COMPREHENSIVE METABOLIC PANEL
ALT: 739 U/L — ABNORMAL HIGH (ref 0–44)
AST: 459 U/L — ABNORMAL HIGH (ref 15–41)
Albumin: 3.6 g/dL (ref 3.5–5.0)
Alkaline Phosphatase: 41 U/L (ref 38–126)
Anion gap: 10 (ref 5–15)
BUN: 31 mg/dL — ABNORMAL HIGH (ref 6–20)
CO2: 24 mmol/L (ref 22–32)
Calcium: 8.9 mg/dL (ref 8.9–10.3)
Chloride: 107 mmol/L (ref 98–111)
Creatinine, Ser: 0.7 mg/dL (ref 0.44–1.00)
GFR calc Af Amer: >60 mL/min (ref >60)
GFR calc non Af Amer: >60 mL/min (ref >60)
Glucose, Bld: 125 mg/dL — ABNORMAL HIGH (ref 70–99)
Potassium: 3.3 mmol/L — ABNORMAL LOW (ref 3.5–5.1)
Sodium: 141 mmol/L (ref 135–145)
Total Bilirubin: 1.2 mg/dL (ref 0.3–1.2)
Total Protein: 6.7 g/dL (ref 6.5–8.1)

## 2020-01-13 LAB — AMMONIA: Ammonia: 42 umol/L — ABNORMAL HIGH (ref 9–35)

## 2020-01-13 LAB — CK: Total CK: 145 U/L (ref 38–234)

## 2020-01-13 LAB — PHENYTOIN LEVEL, TOTAL: Phenytoin Lvl: 15.8 ug/mL (ref 10.0–20.0)

## 2020-01-13 LAB — HIV ANTIBODY (ROUTINE TESTING W REFLEX): HIV Screen 4th Generation wRfx: NONREACTIVE

## 2020-01-13 MED ORDER — SODIUM CHLORIDE 0.9 % IV SOLN
1411.8000 mg | Freq: Once | INTRAVENOUS | Status: AC
  Administered 2020-01-13: 1411.8 mg via INTRAVENOUS
  Filled 2020-01-13: qty 10.86

## 2020-01-13 MED ORDER — LACTULOSE 10 GM/15ML PO SOLN
20.0000 g | Freq: Every day | ORAL | Status: DC
  Administered 2020-01-14 – 2020-01-19 (×6): 20 g via ORAL
  Filled 2020-01-13 (×10): qty 30

## 2020-01-13 MED ORDER — PHENOBARBITAL SODIUM 130 MG/ML IJ SOLN: 20.0000 mg/kg | Freq: Every day | INTRAMUSCULAR | Status: DC

## 2020-01-13 MED ORDER — PHENOBARBITAL SODIUM 65 MG/ML IJ SOLN
65.0000 mg | Freq: Two times a day (BID) | INTRAMUSCULAR | Status: DC
  Administered 2020-01-13 – 2020-01-24 (×23): 65 mg via INTRAVENOUS
  Filled 2020-01-13 (×23): qty 1

## 2020-01-13 NOTE — Progress Notes (Addendum)
NEURO HOSPITALIST PROGRESS NOTE   Subjective: Alert in bed. No attempt at speech. Not tracking as staff walk around room. Not following any commands verbally or to demonstration. No family at bedside.   Exam: Vitals:   01/13/20 0317 01/13/20 0753  BP: (!) 141/86 (!) 129/99  Pulse: 88 100  Resp: (!) 22 17  Temp: 98.8 F (37.1 C) 98.2 F (36.8 C)  SpO2: 98% 99%    Physical Exam  Constitutional: Appears well-developed and well-nourished.  Psych: Staring, no attempt at speech or interaction  Eyes: Normal external eye and conjunctiva. HENT: Normocephalic, no lesions, without obvious abnormality.   Musculoskeletal-no joint tenderness, deformity or swelling Cardiovascular: Normal rate and regular rhythm.  Respiratory: Effort normal, non-labored breathing saturations WNL GI: Soft.  No distension. There is no tenderness.  Skin: WDI  Neuro Exam  Mental Status: Alert, No attempt at speech. Not following any commands. Cranial Nerves: II: Blinked to visual threat bilaterally III,IV, VI: not tracking appropriately as staff move around room. Starring ahead.  Motor: Not following any commands with extremities, withdrew all extremities to noxious stimuli  Tone and bulk:normal tone throughout; no atrophy noted   Medications: I have reviewed the patient's current medications. Scheduled Meds: . Chlorhexidine Gluconate Cloth  6 each Topical Daily  . enoxaparin (LOVENOX) injection  40 mg Subcutaneous Q24H  . influenza vac split quadrivalent PF  0.5 mL Intramuscular Tomorrow-1000  . insulin aspart  0-9 Units Subcutaneous TID WC  . lactulose  20 g Oral Daily  . mouth rinse  15 mL Mouth Rinse BID  . PHENObarbital  65 mg Intravenous BID  . phenytoin (DILANTIN) IV  100 mg Intravenous Q8H  . pyridOXINE  100 mg Intravenous Daily   Continuous Infusions: . lacosamide (VIMPAT) IV 200 mg (01/13/20 1211)  . levETIRAcetam 1,500 mg (01/13/20 0916)  . PHENObarbital 1000 mg IVPB      PRN Meds:.LORazepam **OR** LORazepam, ondansetron (ZOFRAN) IV  Scheduled Meds: Scheduled Meds: . Chlorhexidine Gluconate Cloth  6 each Topical Daily  . enoxaparin (LOVENOX) injection  40 mg Subcutaneous Q24H  . influenza vac split quadrivalent PF  0.5 mL Intramuscular Tomorrow-1000  . insulin aspart  0-9 Units Subcutaneous TID WC  . lactulose  20 g Oral Daily  . mouth rinse  15 mL Mouth Rinse BID  . PHENObarbital  65 mg Intravenous BID  . phenytoin (DILANTIN) IV  100 mg Intravenous Q8H  . pyridOXINE  100 mg Intravenous Daily   Continuous Infusions: . lacosamide (VIMPAT) IV 200 mg (01/13/20 1211)  . levETIRAcetam 1,500 mg (01/13/20 0916)  . PHENObarbital 1000 mg IVPB     PRN Meds:.LORazepam **OR** LORazepam, ondansetron (ZOFRAN) IV   Pertinent Labs/Diagnostics:   Overnight EEG with video  Result Date: 01/13/2020 Charlsie Quest, MD     01/13/2020  9:16 AM Patient Name:Anita Davis VZD:[id number] Epilepsy Attending:Priyanka [name] Referring Physician/Provider:Dr Marlane Hatcher Duration:01/12/2020 1656 to 9/22/[id number]  Patient history:34 y.o.femalewith medical history significant forhepatitis C and polysubstance abuse whowas brought to the ED by EMS for altered mental statusand found to be in status.EEG to evaluate for seizure.  Level of alertness:Awake, asleep  AEDs during EEG study:LEV, Vmpat, PHT  Technical aspects: This EEG study was done with scalp electrodes positioned according to the 10-20 International system of electrode placement. Electrical activity was acquired at a sampling rate of 500Hz  and reviewed with a high  frequency filter of 70Hz  and a low frequency filter of 1Hz . EEG data were recorded continuously and digitally stored.  Description: The posterior dominant rhythm consists of10Hz  activity of moderate voltage (25-35 uV) seen predominantly in posterior head regions, symmetric and reactive to eye opening and eye closing. Sleep was  characterized by vertex waves, sleep spindles (12 to 14 Hz), maximal frontocentral region. Sharp waves in left centrotemporal region were noted, a times in runs mostly during sleep. EEG also showed intermittent rhythmic 3-6hz  theta-delta slowing in left centrotemporal regionHyperventilation and photic stimulation were not performed.   ABNORMALITY - Sharp waves, left centro-temporal region - Intermittent rhythmic slow, left centro-temporal region  IMPRESSION: This study showed evidence ofepileptogenicity as well as cortical dysfunction arising from left centrotemporal region.Lateralized rhythmic slowing in left centro-temporal region  Is on the ictal-interictal continuum with low potential for seizures. No definite seizures were seen during this study.   Priyanka   Assessment:  Anita Davis is an 34 y.o. female with a past medical history of hepatitis C and polysubstance abuse. Presented to [facility] 9/13 with suspected methamphetamine use, discharged and bought back in later that day with confusion and agitation. She was treated with Keppra and when seizure activity continued, vimpat was added. LP was unremarkable. She was transferred to [facility] for continuous EEG Monitoring.   Upon admission EEG was concerning for seizure activity and ativan/fosphenytoin added. Given that it was refractory to two agents, diagnosis was new onset refractory status epilepticus (NORSE). It was felt that new onset refractory status is often associated with autoimmune causes and therefore she was given IV Solu-Medrol x5days, which was completed yesterday. Bedside nurse reports that she was not speaking much yesterday, but was talking. Today she has no attempts at speech. HIV was pending and returned negative.   Overnight EEG Impression this morning by Dr. 10/13: This study showed evidence ofepileptogenicity as well as cortical dysfunction arising from left centrotemporal region.Lateralized rhythmic  slowing in left centro-temporal region  Is on the ictal-interictal continuum with low potential for seizures. No definite seizures were seen during this study.  Impression: Seizure: NORSE: new onset refractory status epilepticus  Recommendations:   continue phenytoin 100 mg 3 times daily  continue Keppra 1500 mg twice daily  continue Vimpat 200 mg twice daily  Starting Phenobarbital, 1,411.8mg  once, then 65mg  BID due to concern for continued seizure activity on EEG and decline in exam.   Following pending labs:   serum autoimmune epilepsy panel  CSF HSV  [facility] DNP, FNP-C  [facility] Nurse Practitioner  Patient examined with Dr. Melynda Ripple Attending Neurologist note review to follow   01/13/2020, 10:59 AM    Attending Neurohospitalist Addendum Patient seen and examined with APP/Resident. Agree with the history and physical as documented above. Agree with the plan as documented, which I helped formulate. I have independently reviewed the chart, obtained history, review of systems and examined the patient.I have personally reviewed pertinent head/neck/spine imaging (CT/MRI). Please feel free to call with any questions. --- Cathrine Muster, MD [facility] Pager: [phone]  If 7pm to 7am, please call on call as listed on AMION.

## 2020-01-13 NOTE — Progress Notes (Addendum)
PROGRESS NOTE  Anita Davis GQQ:[id number] DOB: 26-Dec-1985 DOA: 01/04/2020 PCP: [name], PA-C  HPI/Recap of past 37 hours: 34 year old female with past medical history of hepatitis C and polysubstance abuse brought to the hospital on 9/13 for disorientation which was suspected to be substance abuse.  After she became more lucid, she was discharged, but then police brought her back later in the day and they found her passed out in a parking lot.  The emergency room, urine drug screen positive for benzodiazepines and THC although negative for methamphetamine, which patient had reportedly been taking in the past according to family.  Work-up in the emergency room otherwise unrevealing.  Patient required several doses of IV Ativan to control her agitation.  Because of her persistent altered confusion, neurology was consulted and patient was admitted to the hospitalist service.  She underwent lumbar puncture which was unrevealing.  An EEG was done on 9/15 noting the patient was in status epilepticus.  Patient was started on Keppra.  Despite this, EEG noted continued seizures requiring additional medications as well.  Finally on 9/18, seizures broke using Ativan and fosphenytoin plus Keppra and Vimpat and Solu-Medrol.  Neurology feels that patient has likely been seizing from 8/13-8/18.    Completed steroid course on 9/21.  Patient herself from a mentation standpoint waxes and wanes.  Barely verbalizes.  Oftentimes sometimes stares.  Follow-up EEG on 9/20 noted evidence of epileptogenicity from the left central temporal region.  Repeat EEG done 9/22 noted epileptogenicity as well as cortical dysfunction from that same left central temporal region.  We will discussed with neurology.  Rest of her lab work noteworthy for a minimally elevated ammonia level at 42, normal CK and minimal elevation of BUN, likely from decreased p.o. intake.  Patient herself remains nonverbal to me and does not really answer  questions  Assessment/Plan: Principal Problem:   Acute metabolic encephalopathy secondary to persistent refractory continuous seizures.  On fosphenytoin, Keppra, Vimpat and receiving Solu-Medrol for possible autoimmune cause which can be seen with new onset refractory seizures.  Greatly appreciate neurology help.  Completed Solu-Medrol 9/21 patient had follow-up EEG done today which noted evidence of epileptogenicity arising from the left central temporal region.  Continue antiepileptics.  Will discuss with neurology.  They are adding Phenobarb.  Although her ammonia level is only minimally elevated, will try more lactulose to see if this improves Active Problems:   Hypokalemia: Secondary dehydration.  Replaced.   Polysubstance abuse (HCC): Once she is more alert, will counsel.  Addendum: Nursing had spoken with patient's aunt.  She raised concerns about patient's boyfriend/roommate who has been visiting.  Nurse was with patient when boyfriend visited today.  Nursing reports he is asking probing questions.  Patient had 10 minute period of lucidity and conveyed to nursing possibly fearful of visitor.  Plan made with nursing and myself.  Nurse will alert charge nurse & discuss with closest family member contact-patient's aunt.  Patient will have visitor restriction requiring password made by aunt.  To nurse's knowledge, visitor has not given patient anything.    Elevated CK: Mild elevation in the setting of seizures.  Now normal.    Transaminitis: Increasing numbers.  Patient with history of hepatitis C.  Looks like this was diagnosed back in May and she never followed up with GI as outpatient.  Will check a quantitative RNA.  Overweight: Patient meets criteria BMI greater than 25  Hyperglycemia: A1c here at 5.6.  Has had episodes of higher blood sugars  here, while on steroids.  Continue sliding scale although sugar should improve now that steroids have been discontinued   Code Status: Full  code  Family Communication: Left message for family  Disposition Plan: Hopefully can confirm that she is no longer seizing and her mentation will improve   Consultants:  Neurology  Procedures:  Multiple EEGs  Antimicrobials:  None  DVT prophylaxis: Lovenox   Objective: Vitals:   01/13/20 0753 01/13/20 1209  BP: (!) 129/99 116/78  Pulse: 100 97  Resp: 17 20  Temp: 98.2 F (36.8 C) 98.5 F (36.9 C)  SpO2: 99% 99%    Intake/Output Summary (Last 24 hours) at 01/13/2020 1302 Last data filed at 01/13/2020 0600 Gross per 24 hour  Intake --  Output 800 ml  Net -800 ml   Filed Weights   01/06/20 1251 01/06/20 1710 01/07/20 0500  Weight: 74.8 kg 71.2 kg 70.6 kg   Body mass index is 25.9 kg/m.  Exam:   General: Awake, does not interact.  Confused.  HEENT: Normocephalic and atraumatic, mucous membranes are slightly dry  Cardiovascular: Regular rate and rhythm, S1-S2  Respiratory: Clear to auscultation bilaterally  Abdomen: Soft nontender, nondistended, positive bowel sounds  Musculoskeletal: No clubbing or cyanosis or edema  Skin: No skin breaks, tears or lesions  Psychiatry: Confused   Data Reviewed: CBC: Recent Labs  Lab 01/07/20 0312 01/08/20 0622 01/10/20 1825 01/13/20 0601  WBC 4.0 2.0* 4.0 3.2*  NEUTROABS 1.1* 0.9* 2.9 1.2*  HGB 14.4 15.5* 14.1 14.3  HCT 44.4 44.6 40.6 41.0  MCV 95.1 89.2 87.9 89.3  PLT 196 223 225 211   Basic Metabolic Panel: Recent Labs  Lab 01/07/20 0312 01/08/20 0622 01/10/20 1825 01/12/20 0456 01/13/20 0601  NA 137 139 140 143 141  K 3.4* 3.6 3.6 3.5 3.3*  CL 104 104 106 110 107  CO2 22 21* GLUCOSE 82 162* 182* 136* 125*  BUN 15 22* 20 32* 31*  CREATININE 0.60 0.72 0.78 0.83 0.70  CALCIUM 8.8* 9.2 9.1 9.1 8.9  MG 2.3  --   --   --   --    GFR: Estimated Creatinine Clearance: 95.8 mL/min (by C-G formula based on SCr of 0.7 mg/dL). Liver Function Tests: Recent Labs  Lab 01/08/20 0622  01/10/20 1825 01/12/20 0456 01/13/20 0601  AST 126* 191* 165* 459*  ALT 163* 362* 318* 739*  ALKPHOS 51 44 41 41  BILITOT 1.1 1.2 1.2 1.2  PROT 8.5* 7.5 6.8 6.7  ALBUMIN 4.1 3.9 3.7 3.6   No results for input(s): LIPASE, AMYLASE in the last 168 hours. Recent Labs  Lab 01/13/20 0601  AMMONIA 42*   Coagulation Profile: No results for input(s): INR, PROTIME in the last 168 hours. Cardiac Enzymes: Recent Labs  Lab 01/13/20 1034  CKTOTAL 145   BNP (last 3 results) No results for input(s): PROBNP in the last 8760 hours. HbA1C: Recent Labs    01/10/20 1825  HGBA1C 5.6   CBG: Recent Labs  Lab 01/12/20 1131 01/12/20 1617 01/12/20 2135 01/13/20 0622 01/13/20 1204  GLUCAP 140* 155* 121* 117* 164*   Lipid Profile: No results for input(s): CHOL, HDL, LDLCALC, TRIG, CHOLHDL, LDLDIRECT in the last 72 hours. Thyroid Function Tests: No results for input(s): TSH, T4TOTAL, FREET4, T3FREE, THYROIDAB in the last 72 hours. Anemia Panel: No results for input(s): VITAMINB12, FOLATE, FERRITIN, TIBC, IRON, RETICCTPCT in the last 72 hours. Urine analysis:    Component Value Date/Time   COLORURINE  YELLOW 01/09/2020 1356   APPEARANCEUR CLOUDY (A) 01/09/2020 1356   LABSPEC 1.018 01/09/2020 1356   PHURINE 8.0 01/09/2020 1356   GLUCOSEU NEGATIVE 01/09/2020 1356   HGBUR NEGATIVE 01/09/2020 1356   BILIRUBINUR NEGATIVE 01/09/2020 1356   KETONESUR 20 (A) 01/09/2020 1356   PROTEINUR NEGATIVE 01/09/2020 1356   NITRITE NEGATIVE 01/09/2020 1356   LEUKOCYTESUR NEGATIVE 01/09/2020 1356   Sepsis Labs: @LABRCNTIP (procalcitonin:4,lacticidven:4)  ) Recent Results (from the past 240 hour(s))  SARS Coronavirus 2 by RT PCR (hospital order, performed in [facility] hospital lab) Nasopharyngeal Nasopharyngeal Swab     Status: None   Collection Time: 01/05/20 10:08 AM   Specimen: Nasopharyngeal Swab  Result Value Ref Range Status   SARS Coronavirus 2 NEGATIVE NEGATIVE Final    Comment:  (NOTE) SARS-CoV-2 target nucleic acids are NOT DETECTED.  The SARS-CoV-2 RNA is generally detectable in upper and lower respiratory specimens during the acute phase of infection. The lowest concentration of SARS-CoV-2 viral copies this assay can detect is 250 copies / mL. A negative result does not preclude SARS-CoV-2 infection and should not be used as the sole basis for treatment or other patient management decisions.  A negative result may occur with improper specimen collection / handling, submission of specimen other than nasopharyngeal swab, presence of viral mutation(s) within the areas targeted by this assay, and inadequate number of viral copies (<250 copies / mL). A negative result must be combined with clinical observations, patient history, and epidemiological information.  Fact Sheet for Patients:   01/07/20  Fact Sheet for Healthcare Providers: BoilerBrush.com.cy  This test is not yet approved or  cleared by the https://pope.com/ FDA and has been authorized for detection and/or diagnosis of SARS-CoV-2 by FDA under an Emergency Use Authorization (EUA).  This EUA will remain in effect (meaning this test can be used) for the duration of the COVID-19 declaration under Section 564(b)(1) of the Act, 21 U.S.C. section 360bbb-3(b)(1), unless the authorization is terminated or revoked sooner.  Performed at [facility], [address], [place], [place]   CSF culture     Status: None   Collection Time: 01/05/20  6:28 PM   Specimen: Lumbar Puncture; Cerebrospinal Fluid  Result Value Ref Range Status   Specimen Description   Final    LUMBAR Performed at [facility], [address] [place], [place]    Special Requests   Final    NONE Performed at [facility], [address], [place], [place]    Culture   Final    NO GROWTH Performed at [facility], [address] [place],  [place]    Report Status 01/09/2020 FINAL  Final  Blood culture (routine x 2)     Status: None   Collection Time: 01/05/20  9:15 PM   Specimen: BLOOD LEFT HAND  Result Value Ref Range Status   Specimen Description BLOOD LEFT HAND  Final   Special Requests   Final    BOTTLES DRAWN AEROBIC AND ANAEROBIC Blood Culture adequate volume   Culture   Final    NO GROWTH 5 DAYS Performed at [name], [address], [place], [place]    Report Status 01/10/2020 FINAL  Final  Blood culture (routine x 2)     Status: None   Collection Time: 01/05/20  9:18 PM   Specimen: BLOOD LEFT ARM  Result Value Ref Range Status   Specimen Description BLOOD LEFT ARM  Final  Special Requests   Final    BOTTLES DRAWN AEROBIC AND ANAEROBIC Blood Culture adequate volume   Culture   Final    NO GROWTH 5 DAYS Performed at [facility], [address] [place], [place] [address]    Report Status 01/10/2020 FINAL  Final  Gram stain     Status: None   Collection Time: 01/05/20  9:24 PM   Specimen: CSF  Result Value Ref Range Status   Specimen Description CSF  Final   Special Requests NONE  Final   Gram Stain   Final    CYTOSPIN SMEAR NO WBC SEEN NO ORGANISMS SEEN Performed at [facility], [address] [place], [place] [address]    Report Status 01/05/2020 FINAL  Final  MRSA PCR Screening     Status: Abnormal   Collection Time: 01/06/20  5:12 PM   Specimen: Nasal Mucosa; Nasopharyngeal  Result Value Ref Range Status   MRSA by PCR POSITIVE (A) NEGATIVE Corrected    Comment: TETREAULT,H @0638  BY HUFFINES,S 9.16.2021         The GeneXpert MRSA Assay (FDA approved for NASAL specimens only), is one component of a comprehensive MRSA colonization surveillance program. It is not intended to diagnose MRSA infection nor to guide or monitor treatment for MRSA infections. Performed at [facility], [address] [place], [place] [address] CORRECTED ON 09/16 AT 0654: PREVIOUSLY REPORTED AS  POSITIVE        The GeneXpert MRSA Assay (FDA approved for NASAL specimens only), is one component of a comprehensive MRSA colonization surveillance program. It is not intended to diagnose MRSA infection  nor to guide or monitor treatment for MRSA infections. RESULT CALLED TO, READ BACK BY AND VERIFIED WITH: TETREAULT,H       Studies: Overnight EEG with video  Result Date: 01/13/2020 Charlsie QuestYadav, Priyanka O, MD     01/13/2020  9:16 AM Patient Name:Alivya Dunstan OZH:[id number] Epilepsy Attending:Priyanka [name] Yadav Referring Physician/Provider:Dr Marlane HatcherSrishti Bhaga Duration:01/12/2020 1656 to 9/22/[id number]  Patient history:34 y.o.femalewith medical history significant forhepatitis C and polysubstance abuse whowas brought to the ED by EMS for altered mental statusand found to be in status.EEG to evaluate for seizure.  Level of alertness:Awake, asleep  AEDs during EEG study:LEV, Vmpat, PHT  Technical aspects: This EEG study was done with scalp electrodes positioned according to the 10-20 International system of electrode placement. Electrical activity was acquired at a sampling rate of 500Hz  and reviewed with a high frequency filter of 70Hz  and a low frequency filter of 1Hz . EEG data were recorded continuously and digitally stored.  Description: The posterior dominant rhythm consists of10Hz  activity of moderate voltage (25-35 uV) seen predominantly in posterior head regions, symmetric and reactive to eye opening and eye closing. Sleep was characterized by vertex waves, sleep spindles (12 to 14 Hz), maximal frontocentral region. Sharp waves in left centrotemporal region were noted, a times in runs mostly during sleep. EEG also showed intermittent rhythmic 3-6hz  theta-delta slowing in left centrotemporal regionHyperventilation and photic stimulation were not performed.   ABNORMALITY - Sharp waves, left centro-temporal region - Intermittent rhythmic slow, left centro-temporal region  IMPRESSION:  This study showed evidence ofepileptogenicity as well as cortical dysfunction arising from left centrotemporal region.Lateralized rhythmic slowing in left centro-temporal region  Is on the ictal-interictal continuum with low potential for seizures. No definite seizures were seen during this study.   Priyanka [name] Yadav    Scheduled Meds: . Chlorhexidine Gluconate Cloth  6 each Topical Daily  . enoxaparin (LOVENOX) injection  40 mg Subcutaneous Q24H  . influenza vac split quadrivalent PF  0.5 mL Intramuscular Tomorrow-1000  . insulin aspart  0-9 Units Subcutaneous TID WC  . mouth rinse  15 mL Mouth Rinse BID  . phenytoin (DILANTIN) IV  100 mg Intravenous Q8H  . pyridOXINE  100 mg Intravenous Daily    Continuous Infusions: . lacosamide (VIMPAT) IV 200 mg (01/13/20 1211)  . levETIRAcetam 1,500 mg (01/13/20 0916)     LOS: 7 days     Hollice Espy, MD [facility]   01/13/2020, 1:02 PM

## 2020-01-13 NOTE — Progress Notes (Signed)
Patient's aunt called me this morning with concerns about the patients boyfriend/roomate and felt uncomfortable with him visiting. Pt's boyfriend/roomate was present this afternoon and asking probing questions regarding her medical status. I did not share any medical information with him.   The patient had a brief 10 minute period of lucidity where she became alert and oriented x 3 (to person, place and situation) and conveyed to me that she did not feel comfortable with said boyfriend/roomate visiting her in the hospital.   I spoke with the attending MD at 1530 to make him aware of the situation. MD advised me to contact aunt and obtain a new patient password for visitor restrictions. The updated patient password has been updated in pt's chart. Charge nurse also made aware.    Patient will have visitor restriction requiring password made by aunt.  To my knowledge, the said visitor has not given patient anything.

## 2020-01-13 NOTE — Progress Notes (Signed)
PT Cancellation Note  [name] MRN: [id number] DOB: 1985-11-23   Cancelled Treatment:    Reason Eval/Treat Not Completed: Medical issues which prohibited therapy Patient opens eyes to verbal stim, but unable to respond or follow commands. Will re-attempt at later date.   [name] 01/13/2020, 1:51 PM

## 2020-01-13 NOTE — Procedures (Addendum)
Patient Name:Keviana Texeira ASN:[id number] Epilepsy Attending:Solstice Lastinger [name] Referring Physician/Provider:Dr Brooke Dare Duration:01/12/2020 1937 to 9/22/20211656  Patient history:34 y.o.femalewith medical history significant forhepatitis C and polysubstance abuse whowas brought to the ED by EMS for altered mental statusand found to be in status.EEG to evaluate for seizure.  Level of alertness:Awake, asleep  AEDs during EEG study:LEV, Vmpat, PHT, Phenobarb  Technical aspects: This EEG study was done with scalp electrodes positioned according to the 10-20 International system of electrode placement. Electrical activity was acquired at a sampling rate of 500Hz  and reviewed with a high frequency filter of 70Hz  and a low frequency filter of 1Hz . EEG data were recorded continuously and digitally stored.   Description: The posterior dominant rhythm consists of10Hz  activity of moderate voltage (25-35 uV) seen predominantly in posterior head regions, symmetric and reactive to eye opening and eye closing. Sleep was characterized by vertex waves, sleep spindles (12 to 14 Hz), maximal frontocentral region. Sharp waves in left centrotemporal region were noted, a times in runs mostly during sleep. EEG also showed intermittent rhythmic 3-6hz  theta-delta slowing in left centrotemporal regionHyperventilation and photic stimulation were not performed.   ABNORMALITY - Sharp waves, left centro-temporal region - Intermittent rhythmic slow, left centro-temporal region  IMPRESSION: This study showed evidence ofepileptogenicity as well as cortical dysfunction arising from left centrotemporal region.Lateralized rhythmic slowing in left centro-temporal region  Is on the ictal-interictal continuum with low potential for seizures. No definite seizures were seen during this study.    Myshawn Chiriboga 

## 2020-01-14 ENCOUNTER — Inpatient Hospital Stay (HOSPITAL_COMMUNITY): Payer: Medicaid Other

## 2020-01-14 LAB — GLUCOSE, CAPILLARY
Glucose-Capillary: 109 mg/dL — ABNORMAL HIGH (ref 70–99)
Glucose-Capillary: 100 mg/dL — ABNORMAL HIGH (ref 70–99)
Glucose-Capillary: 96 mg/dL (ref 70–99)
Glucose-Capillary: 96 mg/dL (ref 70–99)
Glucose-Capillary: 104 mg/dL — ABNORMAL HIGH (ref 70–99)

## 2020-01-14 LAB — BLOOD GAS, ARTERIAL
Acid-Base Excess: 2.2 mmol/L — ABNORMAL HIGH (ref 0.0–2.0)
Bicarbonate: 25.8 mmol/L (ref 20.0–28.0)
Drawn by: 51155
FIO2: 21
O2 Saturation: 98.4 %
Patient temperature: 36.6
pCO2 arterial: 36.7 mmHg (ref 32.0–48.0)
pH, Arterial: 7.46 — ABNORMAL HIGH (ref 7.350–7.450)
pO2, Arterial: 102 mmHg (ref 83.0–108.0)

## 2020-01-14 LAB — PHENOBARBITAL LEVEL: Phenobarbital: 36.5 ug/mL — ABNORMAL HIGH (ref 15.0–30.0)

## 2020-01-14 LAB — HCV RNA QUANT
HCV Quantitative Log: 6.681 log10 IU/mL (ref >1.70)
HCV Quantitative: 4800000 IU/mL (ref >50)

## 2020-01-14 MED ORDER — LORAZEPAM 2 MG/ML IJ SOLN
1.0000 mg | INTRAMUSCULAR | Status: DC | PRN
  Administered 2020-01-14 – 2020-01-16 (×5): 1 mg via INTRAVENOUS
  Filled 2020-01-14 (×5): qty 1

## 2020-01-14 MED ORDER — SODIUM CHLORIDE 0.45 % NICU IV INFUSION SIMPLE
INJECTION | INTRAVENOUS | Status: DC
  Filled 2020-01-14 (×4): qty 500

## 2020-01-14 MED ORDER — SODIUM CHLORIDE 0.45 % IV SOLN: INTRAVENOUS | Status: DC

## 2020-01-14 NOTE — Procedures (Signed)
Patient Name:Anita Davis XHF:[id number] Epilepsy Attending:Quanda Pavlicek [name] Referring Physician/Provider:Dr Brooke Dare Duration:01/13/2020 0233 to 9/23/20211656  Patient history:34 y.o.femalewith medical history significant forhepatitis C and polysubstance abuse whowas brought to the ED by EMS for altered mental statusand found to be in status.EEG to evaluate for seizure.  Level of alertness:Awake, asleep  AEDs during EEG study:LEV, Vmpat, PHT, Phenobarb  Technical aspects: This EEG study was done with scalp electrodes positioned according to the 10-20 International system of electrode placement. Electrical activity was acquired at a sampling rate of 500Hz  and reviewed with a high frequency filter of 70Hz  and a low frequency filter of 1Hz . EEG data were recorded continuously and digitally stored.   Description: The posterior dominant rhythm consists of10Hz  activity of moderate voltage (25-35 uV) seen predominantly in posterior head regions, symmetric and reactive to eye opening and eye closing. Sleep was characterized by vertex waves, sleep spindles (12 to 14 Hz), maximal frontocentral region.Generalized 15-18Hz  beta activity, maximal frontocentral region was seen. Sharp waves in left centrotemporal region were noted. Hyperventilation and photic stimulation were not performed.  ABNORMALITY - Sharp waves, left centro-temporal region  IMPRESSION: This study showed evidence ofepileptogenicity arising from left centrotemporal region.No definite seizures were seen during this study.   EEG appears to be improving compared to previous study.    Anita Davis 

## 2020-01-14 NOTE — Progress Notes (Signed)
LTM maintenance completed; reprepped under CZ, C3, and A2; checked skin also under Fp1. No skin breakdown was seen.

## 2020-01-14 NOTE — Plan of Care (Signed)

## 2020-01-14 NOTE — Progress Notes (Signed)
Patient seen for red MEWS (d/t tachypnea and decreased LOC). Chart was reviewed, patient examined, and case discussed with RN at bedside. LOC has apparently been fluctuating quite a bit but respirations were normal earlier. Patient wakes to light touch but not speaking or following commands. She is tachypneic with clear lung sounds, other vitals normal. ABG was ordered to see if she is acidotic but otherwise continue current plan for now.

## 2020-01-14 NOTE — Progress Notes (Signed)
PROGRESS NOTE  Anita Davis BJY:[id number] DOB: 02-27-1986 DOA: 01/04/2020 PCP: Jacquelin HawkingMcElroy, Shannon, PA-C  HPI/Recap of past 5524 hours: 34 year old female with past medical history of hepatitis C and polysubstance abuse brought to the hospital on 9/13 for disorientation which was suspected to be substance abuse.  After she became more lucid, she was discharged, but then police brought her back later in the day and they found her passed out in a parking lot.  The emergency room, urine drug screen positive for benzodiazepines and THC although negative for methamphetamine, which patient had reportedly been taking in the past according to family.  Work-up in the emergency room otherwise unrevealing.  Patient required several doses of IV Ativan to control her agitation.  Because of her persistent altered confusion, neurology was consulted and patient was admitted to the hospitalist service.  She underwent lumbar puncture which was unrevealing.  An EEG was done on 9/15 noting the patient was in status epilepticus.  Patient was started on Keppra.  Despite this, EEG noted continued seizures requiring additional medications as well.  Finally on 9/18, seizures broke using Ativan and fosphenytoin plus Keppra and Vimpat and Solu-Medrol.  Neurology feels that patient has likely been seizing from 8/13-8/18.    Completed steroid course on 9/21.  Patient herself from a mentation standpoint waxes and wanes.  Barely verbalizes.  Oftentimes sometimes stares.  Follow-up EEG on 9/20 noted evidence of epileptogenicity from the left central temporal region.  Repeat EEG done 9/22 noted epileptogenicity as well as cortical dysfunction from that same left central temporal region.    Neurology continues to follow.  Assessment/Plan:  Refractory seizures with acute metabolic encephalopathy  Neurology continues to follow.  She is on long-term monitoring.  Patient's mentation seems to be fluctuating.  Noted to be calm when I saw her  earlier today although she was asleep.  Later on she was noted to be more agitated. Noted to be on Vimpat, Keppra, phenobarbital and phenytoin. Management per neurology.  She also underwent LP during this hospitalization. She was noted to have elevated ammonia level for which she was started on lactulose.  We will recheck levels tomorrow.  Transaminitis/history of hepatitis C Hepatitis C was diagnosed in May.  She has never followed up with any outpatient providers.  LFTs noted to be significantly elevated yesterday.  We will continue to trend.  Do right upper quadrant ultrasound.  Elevated ammonia level as discussed above.  Hypokalemia This was repleted.  Recheck labs tomorrow.  Check magnesium.  History of polysubstance abuse Will need counseling once patient is more awake and oriented.  As per previous attending's note: Nursing had spoken with patient's aunt.  She raised concerns about patient's boyfriend/roommate who has been visiting.  Nurse was with patient when boyfriend visited today.  Nursing reports he is asking probing questions.  Patient had 10 minute period of lucidity and conveyed to nursing possibly fearful of visitor.  Plan made with nursing and myself.  Nurse will alert charge nurse & discuss with closest family member contact-patient's aunt.  Patient will have visitor restriction requiring password made by aunt.  To nurse's knowledge, visitor has not given patient anything.  Hyperglycemia secondary to steroids Seems to have improved.  She is off of steroids currently.  SSI.  HbA1c is 5.6.  Code Status: Full code DVT prophylaxis: Lovenox Family Communication: No family at bedside Disposition Plan: Hopefully can confirm that she is no longer seizing and her mentation will improve  Status is: Inpatient  Remains inpatient appropriate because:Altered mental status   Dispo: The patient is from: Home              Anticipated d/c is to: Home              Anticipated d/c date  is: 3 days              Patient currently is not medically stable to d/c.     Consultants:  Neurology  Procedures:  Multiple EEGs  Antimicrobials:  None    Objective: Vitals:   01/14/20 0725 01/14/20 0732  BP: 111/74 111/74  Pulse: 83 78  Resp: (!) 23 16  Temp: 98.2 F (36.8 C) 98.2 F (36.8 C)  SpO2:  99%    Intake/Output Summary (Last 24 hours) at 01/14/2020 0924 Last data filed at 01/13/2020 2000 Gross per 24 hour  Intake 290 ml  Output 300 ml  Net -10 ml   Filed Weights   01/06/20 1710 01/07/20 0500 01/13/20 1615  Weight: 71.2 kg 70.6 kg 72.3 kg   Body mass index is 26.52 kg/m.  Exam:  General appearance: Agitated Resp: Clear to auscultation bilaterally.  Normal effort Cardio: S1-S2 is normal regular.  No S3-S4.  No rubs murmurs or bruit GI: Abdomen is soft.  Nontender nondistended.  Bowel sounds are present normal.  No masses organomegaly Extremities: Noted to be moving all her extremities Neurologic: Disoriented.  No obvious focal deficits noted.    Data Reviewed: CBC: Recent Labs  Lab 01/08/20 0622 01/10/20 1825 01/13/20 0601  WBC 2.0* 4.0 3.2*  NEUTROABS 0.9* 2.9 1.2*  HGB 15.5* 14.1 14.3  HCT 44.6 40.6 41.0  MCV 89.2 87.9 89.3  PLT 223 225 211   Basic Metabolic Panel: Recent Labs  Lab 01/08/20 0622 01/10/20 1825 01/12/20 0456 01/13/20 0601  NA 139 140 143 141  K 3.6 3.6 3.5 3.3*  CL 104 106 110 107  CO2 21* 23 23 24   GLUCOSE 162* 182* 136* 125*  BUN 22* 20 32* 31*  CREATININE 0.72 0.78 0.83 0.70  CALCIUM 9.2 9.1 9.1 8.9   GFR: Estimated Creatinine Clearance: 96.8 mL/min (by C-G formula based on SCr of 0.7 mg/dL). Liver Function Tests: Recent Labs  Lab 01/08/20 0622 01/10/20 1825 01/12/20 0456 01/13/20 0601  AST 126* 191* 165* 459*  ALT 163* 362* 318* 739*  ALKPHOS 51 44 41 41  BILITOT 1.1 1.2 1.2 1.2  PROT 8.5* 7.5 6.8 6.7  ALBUMIN 4.1 3.9 3.7 3.6    Recent Labs  Lab 01/13/20 0601  AMMONIA 42*    Cardiac Enzymes: Recent Labs  Lab 01/13/20 1034  CKTOTAL 145   CBG: Recent Labs  Lab 01/13/20 0622 01/13/20 1204 01/13/20 1605 01/13/20 2127 01/14/20 0614  GLUCAP 117* 164* 97 106* 109*   Urine analysis:    Component Value Date/Time   COLORURINE YELLOW 01/09/2020 1356   APPEARANCEUR CLOUDY (A) 01/09/2020 1356   LABSPEC 1.018 01/09/2020 1356   PHURINE 8.0 01/09/2020 1356   GLUCOSEU NEGATIVE 01/09/2020 1356   HGBUR NEGATIVE 01/09/2020 1356   BILIRUBINUR NEGATIVE 01/09/2020 1356   KETONESUR 20 (A) 01/09/2020 1356   PROTEINUR NEGATIVE 01/09/2020 1356   NITRITE NEGATIVE 01/09/2020 1356   LEUKOCYTESUR NEGATIVE 01/09/2020 1356    Recent Results (from the past 240 hour(s))  SARS Coronavirus 2 by RT PCR (hospital order, performed in [facility] hospital lab) Nasopharyngeal Nasopharyngeal Swab     Status: None   Collection Time: 01/05/20 10:08 AM   Specimen:  Nasopharyngeal Swab  Result Value Ref Range Status   SARS Coronavirus 2 NEGATIVE NEGATIVE Final    Comment: (NOTE) SARS-CoV-2 target nucleic acids are NOT DETECTED.  The SARS-CoV-2 RNA is generally detectable in upper and lower respiratory specimens during the acute phase of infection. The lowest concentration of SARS-CoV-2 viral copies this assay can detect is 250 copies / mL. A negative result does not preclude SARS-CoV-2 infection and should not be used as the sole basis for treatment or other patient management decisions.  A negative result may occur with improper specimen collection / handling, submission of specimen other than nasopharyngeal swab, presence of viral mutation(s) within the areas targeted by this assay, and inadequate number of viral copies (<250 copies / mL). A negative result must be combined with clinical observations, patient history, and epidemiological information.  Fact Sheet for Patients:   BoilerBrush.com.cy  Fact Sheet for Healthcare  Providers: https://pope.com/  This test is not yet approved or  cleared by the [place] FDA and has been authorized for detection and/or diagnosis of SARS-CoV-2 by FDA under an Emergency Use Authorization (EUA).  This EUA will remain in effect (meaning this test can be used) for the duration of the COVID-19 declaration under Section 564(b)(1) of the Act, 21 U.S.C. section 360bbb-3(b)(1), unless the authorization is terminated or revoked sooner.  Performed at [facility], [address] [place], [place] [address]   CSF culture     Status: None   Collection Time: 01/05/20  6:28 PM   Specimen: Lumbar Puncture; Cerebrospinal Fluid  Result Value Ref Range Status   Specimen Description   Final    LUMBAR Performed at [facility], [address], [place], [place] [address]    Special Requests   Final    NONE Performed at [facility], [address] [place], [place] [address]    Culture   Final    NO GROWTH Performed at [facility], [address] [place], [place] [address]    Report Status 01/09/2020 FINAL  Final  Blood culture (routine x 2)     Status: None   Collection Time: 01/05/20  9:15 PM   Specimen: BLOOD LEFT HAND  Result Value Ref Range Status   Specimen Description BLOOD LEFT HAND  Final   Special Requests   Final    BOTTLES DRAWN AEROBIC AND ANAEROBIC Blood Culture adequate volume   Culture   Final    NO GROWTH 5 DAYS Performed at [facility], [address], [place], [place] [address]    Report Status 01/10/2020 FINAL  Final  Blood culture (routine x 2)     Status: None   Collection Time: 01/05/20  9:18 PM   Specimen: BLOOD LEFT ARM  Result Value Ref Range Status   Specimen Description BLOOD LEFT ARM  Final   Special Requests   Final    BOTTLES DRAWN AEROBIC AND ANAEROBIC Blood Culture adequate volume   Culture   Final    NO GROWTH 5 DAYS Performed at [facility], [address] [place], [place] [address]    Report  Status 01/10/2020 FINAL  Final  Gram stain     Status: None   Collection Time: 01/05/20  9:24 PM   Specimen: CSF  Result Value Ref Range Status   Specimen Description CSF  Final   Special Requests NONE  Final   Gram Stain   Final    CYTOSPIN SMEAR NO WBC SEEN NO ORGANISMS SEEN Performed at [facility], [address] [place], [place] [address]    Report Status 01/05/2020 FINAL  Final  MRSA PCR Screening     Status: Abnormal   Collection Time: 01/06/20  5:12 PM   Specimen: Nasal Mucosa; Nasopharyngeal  Result Value Ref Range Status   MRSA by PCR POSITIVE (A) NEGATIVE Corrected    Comment: TETREAULT,H @0638  BY HUFFINES,S 9.16.2021         The GeneXpert MRSA Assay (FDA approved for NASAL specimens only), is one component of a comprehensive MRSA colonization surveillance program. It is not intended to diagnose MRSA infection nor to guide or monitor treatment for MRSA infections. Performed at [facility], [address] [place], [place] CORRECTED ON 09/16 AT 0654: PREVIOUSLY REPORTED AS POSITIVE        The GeneXpert MRSA Assay (FDA approved for NASAL specimens only), is one component of a comprehensive MRSA colonization surveillance program. It is not intended to diagnose MRSA infection  nor to guide or monitor treatment for MRSA infections. RESULT CALLED TO, READ BACK BY AND VERIFIED WITH: TETREAULT,H       Studies: No results found.  Scheduled Meds: . Chlorhexidine Gluconate Cloth  6 each Topical Daily  . enoxaparin (LOVENOX) injection  40 mg Subcutaneous Q24H  . influenza vac split quadrivalent PF  0.5 mL Intramuscular Tomorrow-1000  . insulin aspart  0-9 Units Subcutaneous TID WC  . lactulose  20 g Oral Daily  . mouth rinse  15 mL Mouth Rinse BID  . PHENObarbital  65 mg Intravenous BID  . phenytoin (DILANTIN) IV  100 mg Intravenous Q8H  . pyridOXINE  100 mg Intravenous Daily    Continuous Infusions: . sodium chloride    . lacosamide (VIMPAT) IV 200 mg  (01/13/20 2207)  . levETIRAcetam 1,500 mg (01/14/20 0819)     LOS: 8 days     01/16/20, MD [facility]   01/14/2020, 9:24 AM

## 2020-01-14 NOTE — Progress Notes (Signed)
PT Cancellation Note  [name] MRN: [id number] DOB: 1985/07/14   Cancelled Treatment:    Reason Eval/Treat Not Completed: Medical issues which prohibited therapy. Pt agitated. RN preparing to administer ativan and advising pt not appropriate for PT eval at this time. PT to re-attempt as time allows.   Ilda Foil 01/14/2020, 8:43 AM  Aida Raider, PT  Office # [phone] Pager [phone]

## 2020-01-14 NOTE — Progress Notes (Addendum)
NEURO HOSPITALIST PROGRESS NOTE   Subjective: Pt. Seen and examined. Alert in bed. Patient yelling, confused, speaking words,but not in context.  Not tracking as staff walk around room. Not following any commands verbally or to demonstration. No family at bedside. Trying to climb out of bed. Bilateral mittens. LTM running.   LTM 01/14/20 IMPRESSION: This study showed evidence ofepileptogenicity arising from left centrotemporal region.No definite seizures were seen during this study.   EEG appears to be improving compared to previous study.  Exam: Vitals:   01/14/20 0725 01/14/20 0732  BP: 111/74 111/74  Pulse: 83 78  Resp: (!) 23 16  Temp: 98.2 F (36.8 C) 98.2 F (36.8 C)  SpO2:  99%    Physical Exam  Constitutional: Appears well-developed and well-nourished.  Psych: Staring, no attempt at speech or interaction  Eyes: Normal external eye and conjunctiva. HENT: Normocephalic, no lesions, without obvious abnormality.   Musculoskeletal-no joint tenderness, deformity or swelling Cardiovascular: Normal rate and regular rhythm.  Respiratory: Effort normal, non-labored breathing saturations WNL GI: Soft.  No distension. There is no tenderness.  Skin: WDI  Neuro Exam  Mental Status: Alert, confused, yelling, agitated. Not following commands  cranial Nerves: II: Blinked to visual threat bilaterally III,IV, VI: not tracking. PERRL Motor: Not following any commands with extremities, withdrew all extremities to noxious stimuli. Moves spontaneously Tone and bulk:normal tone throughout; no atrophy noted   Medications: I have reviewed the patient's current medications. Scheduled Meds: . Chlorhexidine Gluconate Cloth  6 each Topical Daily  . enoxaparin (LOVENOX) injection  40 mg Subcutaneous Q24H  . influenza vac split quadrivalent PF  0.5 mL Intramuscular Tomorrow-1000  . insulin aspart  0-9 Units Subcutaneous TID WC  . lactulose  20 g Oral Daily  . mouth  rinse  15 mL Mouth Rinse BID  . PHENObarbital  65 mg Intravenous BID  . phenytoin (DILANTIN) IV  100 mg Intravenous Q8H  . pyridOXINE  100 mg Intravenous Daily   Continuous Infusions: . sodium chloride    . lacosamide (VIMPAT) IV 200 mg (01/13/20 2207)  . levETIRAcetam 1,500 mg (01/14/20 0819)   PRN Meds:.LORazepam, LORazepam **OR** [DISCONTINUED] LORazepam, ondansetron (ZOFRAN) IV  Scheduled Meds: Scheduled Meds: . Chlorhexidine Gluconate Cloth  6 each Topical Daily  . enoxaparin (LOVENOX) injection  40 mg Subcutaneous Q24H  . influenza vac split quadrivalent PF  0.5 mL Intramuscular Tomorrow-1000  . insulin aspart  0-9 Units Subcutaneous TID WC  . lactulose  20 g Oral Daily  . mouth rinse  15 mL Mouth Rinse BID  . PHENObarbital  65 mg Intravenous BID  . phenytoin (DILANTIN) IV  100 mg Intravenous Q8H  . pyridOXINE  100 mg Intravenous Daily   Continuous Infusions: . sodium chloride    . lacosamide (VIMPAT) IV 200 mg (01/13/20 2207)  . levETIRAcetam 1,500 mg (01/14/20 0819)   PRN Meds:.LORazepam, LORazepam **OR** [DISCONTINUED] LORazepam, ondansetron (ZOFRAN) IV   Pertinent Labs/Diagnostics:   Overnight EEG with video  Result Date: 01/13/2020 Charlsie Quest, MD     01/13/2020  9:16 AM Patient Name:Anita Davis MWU:[id number] Epilepsy Attending:Priyanka [name] Referring Physician/Provider:Dr Marlane Hatcher Duration:01/12/2020 1656 to 9/22/[id number]  Patient history:34 y.o.femalewith medical history significant forhepatitis C and polysubstance abuse whowas brought to the ED by EMS for altered mental statusand found to be in status.EEG to evaluate for seizure.  Level of alertness:Awake, asleep  AEDs  during EEG study:LEV, Vmpat, PHT  Technical aspects: This EEG study was done with scalp electrodes positioned according to the 10-20 International system of electrode placement. Electrical activity was acquired at a sampling rate of 500Hz  and reviewed with a high  frequency filter of 70Hz  and a low frequency filter of 1Hz . EEG data were recorded continuously and digitally stored.  Description: The posterior dominant rhythm consists of10Hz  activity of moderate voltage (25-35 uV) seen predominantly in posterior head regions, symmetric and reactive to eye opening and eye closing. Sleep was characterized by vertex waves, sleep spindles (12 to 14 Hz), maximal frontocentral region. Sharp waves in left centrotemporal region were noted, a times in runs mostly during sleep. EEG also showed intermittent rhythmic 3-6hz  theta-delta slowing in left centrotemporal regionHyperventilation and photic stimulation were not performed.   ABNORMALITY - Sharp waves, left centro-temporal region - Intermittent rhythmic slow, left centro-temporal region  IMPRESSION: This study showed evidence ofepileptogenicity as well as cortical dysfunction arising from left centrotemporal region.Lateralized rhythmic slowing in left centro-temporal region  Is on the ictal-interictal continuum with low potential for seizures. No definite seizures were seen during this study.   Priyanka   Assessment:  Anita Davis is an 34 y.o. female with a past medical history of hepatitis C and polysubstance abuse. Presented to [facility] 9/13 with suspected methamphetamine use, discharged and bought back in later that day with confusion and agitation. She was treated with Keppra and when seizure activity continued, vimpat was added. LP was unremarkable. She was transferred to [facility] for continuous EEG Monitoring.   Upon admission EEG was concerning for seizure activity and ativan/fosphenytoin added. Given that it was refractory to two agents, diagnosis was new onset refractory status epilepticus (NORSE). It was felt that new onset refractory status is often associated with autoimmune causes and therefore she was given IV Solu-Medrol x5days, which was completed yesterday.   On exam today patient  is speaking, but she is confused and agitated.  Impression: Seizure: NORSE: new onset refractory status epilepticus  Recommendations:   continue phenytoin 100 mg 3 times daily  continue Keppra 1500 mg twice daily  continue Vimpat 200 mg twice daily  Starting Phenobarbital, 1,411.8mg  once, then 65mg  BID due to concern for continued seizure activity on EEG and decline in exam.   Following pending labs:   serum autoimmune epilepsy panel  CSF HSV  [facility], MSN, NP-C [facility] [phone]  Attending Neurologist note review to follow   01/14/2020, 8:59 AM  Attending addendum Patient seen and examined Agree with the above. Continue current antiepileptics as EEG has shown some improvement but exam is still not very good-remains aphasic. If she continues to remain aphasic, will empirically treat with IVIG for possible autoimmune encephalitis pending CSF results.  -- , MD [facility] Pager: [phone] If 7pm to 7am, please call on call as listed on AMION.

## 2020-01-14 NOTE — Progress Notes (Signed)
Phenobarb level 36.5. Continue current dose.

## 2020-01-15 LAB — COMPREHENSIVE METABOLIC PANEL
ALT: 641 U/L — ABNORMAL HIGH (ref 0–44)
AST: 310 U/L — ABNORMAL HIGH (ref 15–41)
Albumin: 3.5 g/dL (ref 3.5–5.0)
Alkaline Phosphatase: 45 U/L (ref 38–126)
Anion gap: 12 (ref 5–15)
BUN: 28 mg/dL — ABNORMAL HIGH (ref 6–20)
CO2: 23 mmol/L (ref 22–32)
Calcium: 8.4 mg/dL — ABNORMAL LOW (ref 8.9–10.3)
Chloride: 104 mmol/L (ref 98–111)
Creatinine, Ser: 0.71 mg/dL (ref 0.44–1.00)
GFR calc Af Amer: >60 mL/min (ref >60)
GFR calc non Af Amer: >60 mL/min (ref >60)
Glucose, Bld: 103 mg/dL — ABNORMAL HIGH (ref 70–99)
Potassium: 3.3 mmol/L — ABNORMAL LOW (ref 3.5–5.1)
Sodium: 139 mmol/L (ref 135–145)
Total Bilirubin: 1.1 mg/dL (ref 0.3–1.2)
Total Protein: 6.3 g/dL — ABNORMAL LOW (ref 6.5–8.1)

## 2020-01-15 LAB — CBC
HCT: 42.6 % (ref 36.0–46.0)
Hemoglobin: 14.6 g/dL (ref 12.0–15.0)
MCH: 30.2 pg (ref 26.0–34.0)
MCHC: 34.3 g/dL (ref 30.0–36.0)
MCV: 88.2 fL (ref 80.0–100.0)
Platelets: 179 10*3/uL (ref 150–400)
RBC: 4.83 MIL/uL (ref 3.87–5.11)
RDW: 11.7 % (ref 11.5–15.5)
WBC: 4.9 10*3/uL (ref 4.0–10.5)
nRBC: 0 % (ref 0.0–0.2)

## 2020-01-15 LAB — GLUCOSE, CAPILLARY
Glucose-Capillary: 110 mg/dL — ABNORMAL HIGH (ref 70–99)
Glucose-Capillary: 111 mg/dL — ABNORMAL HIGH (ref 70–99)
Glucose-Capillary: 125 mg/dL — ABNORMAL HIGH (ref 70–99)
Glucose-Capillary: 179 mg/dL — ABNORMAL HIGH (ref 70–99)
Glucose-Capillary: 183 mg/dL — ABNORMAL HIGH (ref 70–99)
Glucose-Capillary: 79 mg/dL (ref 70–99)

## 2020-01-15 LAB — AMMONIA
Ammonia: 35 umol/L (ref 9–35)
Ammonia: 32 umol/L (ref 9–35)

## 2020-01-15 LAB — PHOSPHORUS: Phosphorus: 2.9 mg/dL (ref 2.5–4.6)

## 2020-01-15 LAB — MAGNESIUM
Magnesium: 2.2 mg/dL (ref 1.7–2.4)
Magnesium: 2.3 mg/dL (ref 1.7–2.4)

## 2020-01-15 MED ORDER — OSMOLITE 1.2 CAL PO LIQD
1000.0000 mL | ORAL | Status: DC
  Administered 2020-01-15 – 2020-01-21 (×3): 1000 mL
  Filled 2020-01-15 (×10): qty 1000

## 2020-01-15 MED ORDER — PROSOURCE TF PO LIQD
45.0000 mL | Freq: Every day | ORAL | Status: DC
  Administered 2020-01-15 – 2020-01-22 (×8): 45 mL
  Filled 2020-01-15 (×8): qty 45

## 2020-01-15 MED ORDER — POTASSIUM CHLORIDE CRYS ER 20 MEQ PO TBCR
20.0000 meq | EXTENDED_RELEASE_TABLET | Freq: Once | ORAL | Status: DC
  Filled 2020-01-15: qty 1

## 2020-01-15 MED ORDER — POTASSIUM CHLORIDE 10 MEQ/100ML IV SOLN
10.0000 meq | INTRAVENOUS | Status: AC
  Administered 2020-01-15 (×2): 10 meq via INTRAVENOUS
  Filled 2020-01-15 (×2): qty 100

## 2020-01-15 MED ORDER — DEXTROSE IN LACTATED RINGERS 5 % IV SOLN: INTRAVENOUS | Status: DC

## 2020-01-15 NOTE — Progress Notes (Signed)
PT Cancellation Note  [name] MRN: [id number] DOB: 04-09-1986   Cancelled Treatment:    Reason Eval/Treat Not Completed: Medical issues which prohibited therapy.  Pt is confused and not able to move on command, on EEG.  Retry as time and pt allow.   Ivar Drape 01/15/2020, 10:00 AM   Samul Dada, PT MS Acute Rehab Dept. Number: [facility] [id number] and [facility] [phone]

## 2020-01-15 NOTE — Progress Notes (Signed)
NEURO HOSPITALIST PROGRESS NOTE   Subjective: Pt. Seen and examined.  Earlier this morning had episodes of agitation and yelling. At the time of my examination comfortably sleeping in bed.   Overnight LTM discussed with the epileptologist: Showing improvement from the day prior-generalized beta activity and no more sharp waves.  EEG appears to be improving compared to previous study.  Exam: Vitals:   01/15/20 0354 01/15/20 0800  BP: 129/90 114/87  Pulse: 84 86  Resp: 17 18  Temp: 98 F (36.7 C) 98.1 F (36.7 C)  SpO2: 97% 100%    On my examination today: General exam: Comfortably laying in bed at this time, sleeping. HEENT: EEG leads on the head, normocephalic, atraumatic, dry oral mucous membranes CVs: Regular rhythm Respiratory: Breathing well and saturating normally on room air Extremities warm well perfused Neurological exam Sleeping, awakens to voice, tracks examiner. Very slow to respond but does follow some simple commands. Today when I asked her how she is doing, she responded "good, how are you?" This is much better from the past 2 days that have seen her where she had been essentially nonverbal. Cranial: Pupils equal round react light, extraocular movements intact, visual fields appear full to threat, face appears symmetric. Motor exam: Symmetric wiggling of toes and moving of hands and grasping on both hands along with attempted raising of both arms at least antigravity bilaterally. Sensory exam: Intact to touch Coordination difficult to assess given her mentation    Medications: I have reviewed the patient's current medications. Scheduled Meds: . Chlorhexidine Gluconate Cloth  6 each Topical Daily  . enoxaparin (LOVENOX) injection  40 mg Subcutaneous Q24H  . influenza vac split quadrivalent PF  0.5 mL Intramuscular Tomorrow-1000  . insulin aspart  0-9 Units Subcutaneous TID WC  . lactulose  20 g Oral Daily  . mouth rinse  15 mL Mouth  Rinse BID  . PHENObarbital  65 mg Intravenous BID  . phenytoin (DILANTIN) IV  100 mg Intravenous Q8H  . pyridOXINE  100 mg Intravenous Daily   Continuous Infusions: . sodium chloride 75 mL/hr at 01/14/20 1934  . lacosamide (VIMPAT) IV 200 mg (01/15/20 1007)  . levETIRAcetam 1,500 mg (01/14/20 1938)   PRN Meds:.LORazepam, LORazepam **OR** [DISCONTINUED] LORazepam, ondansetron (ZOFRAN) IV  Scheduled Meds: Scheduled Meds: . Chlorhexidine Gluconate Cloth  6 each Topical Daily  . enoxaparin (LOVENOX) injection  40 mg Subcutaneous Q24H  . influenza vac split quadrivalent PF  0.5 mL Intramuscular Tomorrow-1000  . insulin aspart  0-9 Units Subcutaneous TID WC  . lactulose  20 g Oral Daily  . mouth rinse  15 mL Mouth Rinse BID  . PHENObarbital  65 mg Intravenous BID  . phenytoin (DILANTIN) IV  100 mg Intravenous Q8H  . pyridOXINE  100 mg Intravenous Daily   Continuous Infusions: . sodium chloride 75 mL/hr at 01/14/20 1934  . lacosamide (VIMPAT) IV 200 mg (01/15/20 1007)  . levETIRAcetam 1,500 mg (01/14/20 1938)   PRN Meds:.LORazepam, LORazepam **OR** [DISCONTINUED] LORazepam, ondansetron (ZOFRAN) IV   Pertinent Labs/Diagnostics:   US Abdomen Limited RUQ  Result Date: 01/14/2020 CLINICAL DATA:  Elevated liver enzymes EXAM: ULTRASOUND ABDOMEN LIMITED RIGHT UPPER QUADRANT COMPARISON:  January 24, 2017 FINDINGS: Gallbladder: No gallstones or wall thickening visualized. No pericholecystic fluid. No sonographic Murphy sign noted by sonographer. Note that only supine imaging could be achieved due to restrictions regarding patient  motion. Common bile duct: Diameter: 5 mm. No intrahepatic or extrahepatic biliary duct dilatation. Liver: No focal lesion identified. Liver echogenicity overall mildly increased. Portal vein is patent on color Doppler imaging with normal direction of blood flow towards the liver. Other: None. IMPRESSION: Mild increase in liver echogenicity may represent a degree of  underlying hepatic steatosis. No focal liver lesions evident. No gallbladder pathology evident. Note that only supine imaging could be achieved due to limitations with respect patient motion for repositioning. Electronically Signed   By: Bretta Bang III M.D.   On: 01/14/2020 11:05   Assessment:  34 year old woman with a past history of hepatitis C and polysubstance abuse presented to [facility] 01/04/2020 with suspected methamphetamine use-discharged and brought back the same day with confusion and agitation.  EEG consistent with new onset refractory status epilepticus-did not break with Ativan and fosphenytoin with some improvement after further addition of antiepileptics. Also received 5 doses of IV Solu-Medrol-CSF extremely bland and autoimmune work-up pending. Today's exam is better than the previous 2 to 3 days of exam and I think that that is probably due to the steroids taking effect as well as medications helping with her brain irritability-EEG today did not show any sharp waves. Impression: Seizure: NORSE: new onset refractory status epilepticus  Recommendations:   continue phenytoin 100 mg 3 times daily  continue Keppra 1500 mg twice daily  continue Vimpat 200 mg twice daily  Continue phenobarb 65 mg twice daily  Following pending labs:   serum autoimmune epilepsy panel CSF HSV-pending  I had an initial thought process of starting her on IVIG if she shows no improvement but today's exam and EEG look better so I will hold off on using IVIG.  We will continue to follow with you.  -- Milon Dikes, MD [facility] Pager: [phone] If 7pm to 7am, please call on call as listed on AMION.

## 2020-01-15 NOTE — Progress Notes (Addendum)
PROGRESS NOTE    Anita Davis  WLN:[id number] DOB: 10-Nov-1985 DOA: 01/04/2020 PCP: Soyla Dryer, PA-C   Brief Narrative: 34 year old with past medical history significant for hepatitis C, polysubstance abuse brought to the hospital 9/13 for disorientation which was suspected to be substance abuse.  After she became more lucid, she was discharged, but then police brought her back later in the day and they found her passed out in a parking lot.  Urine drug screen was positive for benzodiazepine and THC, negative for methamphetamine, which patient had reportedly been taking in the past according to family.  Work-up in the emergency room otherwise unrevealing.  Patient required several doses of IV Ativan to control her agitation.  Because of her persistent altered confusion, neurology was consulted and patient was admitted to the hospitalist service.  She underwent lumbar puncture which was unrevealing.  An EEG done on 9/15 showed patient was in status epilepticus.  Patient was a started on Keppra.  Despite these, EEG noted continued seizures requiring additional medications as well.  Finally on 918, seizures broke using Ativan and fosphenytoin plus Keppra and Vimpat and Solu-Medrol.  Neurology feels that patient has likely been seizing from Vanduser steroid course on 9/21.  Patient herself from mentation standpoint waxes and wane.  Barely verbalize.,  Stares episodes.  -Follow-up EEG on 9/20 noted evidence of epileptic [name] from the left central temporal region.  Repeat EEG done on 9/22 noted epileptogenic city from the left central temporal region.   -Neurology continue to follow and adjust medications     Assessment & Plan:   Principal Problem:   Refractory seizure ([place]) Active Problems:   Acute metabolic encephalopathy   Hypokalemia   Polysubstance abuse (HCC)   Elevated CK   Transaminitis   Chronic hepatitis C without hepatic coma (HCC)   1-Refractory seizures with acute metabolic  encephalopathy: -Patient continues to be on long-term monitoring.- -Patient mental status seems to be fluctuating. -On Vimpat, Keppra, phenobarbital and phenytoin -She also underwent LP during this hospitalization. -She was noted to have elevated ammonia level for which she was a started on lactulose. -Ammonia level ordered for today.  At 35. -ANA negative, ESR, CRP negative.  TSH normal, RPR nonreactive. -HSV spinal fluid pending -EEG improving today  2-Transaminases/history of hepatitis C Otitis C was diagnosed in May. She has never follow-up with any outpatient providers. Right upper quadrant ultrasound; mild increase liver echogenicity may represent degree of underlying hepatic steatosis.  No focal lesion evident.  No gallbladder pathology evident. Follow Liver function test AST 310, ALT 641, trending down.  Will ask pharmacy to review med.   3-Hypokalemia: Replete with IV KCl. Magnesium normal at 2.2.  4-Nutrition: Patient with very poor oral intake.  Plan to continue with IV fluids.  Will order core track and tube feedings  History of polysubstance abuse: Need counseling once patient is more awake and oriented Visitor restriction, need past were made by aunt, concerned about patient's boyfriend and behavior.  See notes documented by Dr. [place] Pink on 9/23  Hyperglycemia: Secondary to a steroid resolved     Estimated body mass index is 26.52 kg/m as calculated from the following:   Height as of this encounter: 5' 5"  (1.651 m).   Weight as of this encounter: 72.3 kg.   DVT prophylaxis: Lovenox Code Status: Full code Family Communication: No family at bedside Disposition Plan:  Status is: Inpatient  Remains inpatient appropriate because:IV treatments appropriate due to intensity of illness or inability to take PO  Dispo: The patient is from: Home              Anticipated d/c is to: to be determine               Anticipated d/c date is: 3 days              Patient  currently is not medically stable to d/c.        Consultants:   Neurology   Procedures:   EEG  Antimicrobials:    Subjective: She is sleepy, not following command or answering question.   Objective: Vitals:   01/14/20 1643 01/14/20 1954 01/14/20 2313 01/15/20 0354  BP: 114/78 (!) 131/97 110/80 129/90  Pulse:  81 66 84  Resp: 20 16 20 17   Temp: 97.9 F (36.6 C) 98.4 F (36.9 C) 98.1 F (36.7 C) 98 F (36.7 C)  TempSrc:  Axillary Oral Oral  SpO2: 94% 98% 99% 97%  Weight:      Height:        Intake/Output Summary (Last 24 hours) at 01/15/2020 0740 Last data filed at 01/15/2020 9485 Gross per 24 hour  Intake 1268.72 ml  Output 1200 ml  Net 68.72 ml   Filed Weights   01/06/20 1710 01/07/20 0500 01/13/20 1615  Weight: 71.2 kg 70.6 kg 72.3 kg    Examination:  General exam: Appears calm and comfortable  Respiratory system: Clear to auscultation. Respiratory effort normal. Cardiovascular system: S1 & S2 heard, RRR. No JVD, murmurs, rubs, gallops or clicks. No pedal edema. Gastrointestinal system: Abdomen is nondistended, soft and nontender. No organomegaly or masses felt. Normal bowel sounds heard. Central nervous system: sleepy, not following  Extremities: Symmetric 5 x 5 power.   Data Reviewed: I have personally reviewed following labs and imaging studies  CBC: Recent Labs  Lab 01/10/20 1825 01/13/20 0601 01/15/20 0253  WBC 4.0 3.2* 4.9  NEUTROABS 2.9 1.2*  --   HGB 14.1 14.3 14.6  HCT 40.6 41.0 42.6  MCV 87.9 89.3 88.2  PLT 225 211 462   Basic Metabolic Panel: Recent Labs  Lab 01/10/20 1825 01/12/20 0456 01/13/20 0601 01/15/20 0253  NA 140 143 141 139  K 3.6 3.5 3.3* 3.3*  CL 106 110 107 104  CO2 23 23 24 23   GLUCOSE 182* 136* 125* 103*  BUN 20 32* 31* 28*  CREATININE 0.78 0.83 0.70 0.71  CALCIUM 9.1 9.1 8.9 8.4*  MG  --   --   --  2.2   GFR: Estimated Creatinine Clearance: 96.8 mL/min (by C-G formula based on SCr of 0.71  mg/dL). Liver Function Tests: Recent Labs  Lab 01/10/20 1825 01/12/20 0456 01/13/20 0601 01/15/20 0253  AST 191* 165* 459* 310*  ALT 362* 318* 739* 641*  ALKPHOS 44 41 41 45  BILITOT 1.2 1.2 1.2 1.1  PROT 7.5 6.8 6.7 6.3*  ALBUMIN 3.9 3.7 3.6 3.5   No results for input(s): LIPASE, AMYLASE in the last 168 hours. Recent Labs  Lab 01/13/20 0601 01/15/20 0253  AMMONIA 42* 35   Coagulation Profile: No results for input(s): INR, PROTIME in the last 168 hours. Cardiac Enzymes: Recent Labs  Lab 01/13/20 1034  CKTOTAL 145   BNP (last 3 results) No results for input(s): PROBNP in the last 8760 hours. HbA1C: No results for input(s): HGBA1C in the last 72 hours. CBG: Recent Labs  Lab 01/14/20 1135 01/14/20 1224 01/14/20 1607 01/14/20 2106 01/15/20 0626  GLUCAP 96 100* 96 104* 110*  Lipid Profile: No results for input(s): CHOL, HDL, LDLCALC, TRIG, CHOLHDL, LDLDIRECT in the last 72 hours. Thyroid Function Tests: No results for input(s): TSH, T4TOTAL, FREET4, T3FREE, THYROIDAB in the last 72 hours. Anemia Panel: No results for input(s): VITAMINB12, FOLATE, FERRITIN, TIBC, IRON, RETICCTPCT in the last 72 hours. Sepsis Labs: No results for input(s): PROCALCITON, LATICACIDVEN in the last 168 hours.  Recent Results (from the past 240 hour(s))  SARS Coronavirus 2 by RT PCR (hospital order, performed in [facility] hospital lab) Nasopharyngeal Nasopharyngeal Swab     Status: None   Collection Time: 01/05/20 10:08 AM   Specimen: Nasopharyngeal Swab  Result Value Ref Range Status   SARS Coronavirus 2 NEGATIVE NEGATIVE Final    Comment: (NOTE) SARS-CoV-2 target nucleic acids are NOT DETECTED.  The SARS-CoV-2 RNA is generally detectable in upper and lower respiratory specimens during the acute phase of infection. The lowest concentration of SARS-CoV-2 viral copies this assay can detect is 250 copies / mL. A negative result does not preclude SARS-CoV-2 infection and should  not be used as the sole basis for treatment or other patient management decisions.  A negative result may occur with improper specimen collection / handling, submission of specimen other than nasopharyngeal swab, presence of viral mutation(s) within the areas targeted by this assay, and inadequate number of viral copies (<250 copies / mL). A negative result must be combined with clinical observations, patient history, and epidemiological information.  Fact Sheet for Patients:   StrictlyIdeas.no  Fact Sheet for Healthcare Providers: BankingDealers.co.za  This test is not yet approved or  cleared by the [place] FDA and has been authorized for detection and/or diagnosis of SARS-CoV-2 by FDA under an Emergency Use Authorization (EUA).  This EUA will remain in effect (meaning this test can be used) for the duration of the COVID-19 declaration under Section 564(b)(1) of the Act, 21 U.S.C. section 360bbb-3(b)(1), unless the authorization is terminated or revoked sooner.  Performed at [facility], [address], [place], [place] [address]   CSF culture     Status: None   Collection Time: 01/05/20  6:28 PM   Specimen: Lumbar Puncture; Cerebrospinal Fluid  Result Value Ref Range Status   Specimen Description   Final    LUMBAR Performed at [facility], [address] [place], [place] [address]    Special Requests   Final    NONE Performed at [facility], [address], [place], [place] [address]    Culture   Final    NO GROWTH Performed at [facility], [place] [address], [place], [place] [address]    Report Status 01/09/2020 FINAL  Final  Blood culture (routine x 2)     Status: None   Collection Time: 01/05/20  9:15 PM   Specimen: BLOOD LEFT HAND  Result Value Ref Range Status   Specimen Description BLOOD LEFT HAND  Final   Special Requests   Final    BOTTLES DRAWN AEROBIC AND ANAEROBIC Blood Culture adequate volume    Culture   Final    NO GROWTH 5 DAYS Performed at [facility], [address], [place], [place] [address]    Report Status 01/10/2020 FINAL  Final  Blood culture (routine x 2)     Status: None   Collection Time: 01/05/20  9:18 PM   Specimen: BLOOD LEFT ARM  Result Value Ref Range Status   Specimen Description BLOOD LEFT ARM  Final   Special Requests   Final  BOTTLES DRAWN AEROBIC AND ANAEROBIC Blood Culture adequate volume   Culture   Final    NO GROWTH 5 DAYS Performed at [facility], [address] [place], [place] [address]    Report Status 01/10/2020 FINAL  Final  Gram stain     Status: None   Collection Time: 01/05/20  9:24 PM   Specimen: CSF  Result Value Ref Range Status   Specimen Description CSF  Final   Special Requests NONE  Final   Gram Stain   Final    CYTOSPIN SMEAR NO WBC SEEN NO ORGANISMS SEEN Performed at [facility], [address], [place], [place] [address]    Report Status 01/05/2020 FINAL  Final  MRSA PCR Screening     Status: Abnormal   Collection Time: 01/06/20  5:12 PM   Specimen: Nasal Mucosa; Nasopharyngeal  Result Value Ref Range Status   MRSA by PCR POSITIVE (A) NEGATIVE Corrected    Comment: TETREAULT,H @0638  BY HUFFINES,S 9.16.2021         The GeneXpert MRSA Assay (FDA approved for NASAL specimens only), is one component of a comprehensive MRSA colonization surveillance program. It is not intended to diagnose MRSA infection nor to guide or monitor treatment for MRSA infections. Performed at [facility], [address] [place], [place] [address] CORRECTED ON 09/16 AT 0654: PREVIOUSLY REPORTED AS POSITIVE        The GeneXpert MRSA Assay (FDA approved for NASAL specimens only), is one component of a comprehensive MRSA colonization surveillance program. It is not intended to diagnose MRSA infection  nor to guide or monitor treatment for MRSA infections. RESULT CALLED TO, READ BACK BY AND VERIFIED WITH: TETREAULT,H           Radiology Studies: Overnight EEG with video  Result Date: 01/13/2020 Lora Havens, MD     01/14/2020  9:59 AM Patient Name:Anita Davis KGU:[id number] Epilepsy Attending:Priyanka Barbra Sarks Referring Physician/Provider:Dr Lesleigh Noe Duration:01/12/2020 6283 to 9/22/20211656  Patient history:34 y.o.femalewith medical history significant forhepatitis C and polysubstance abuse whowas brought to the ED by EMS for altered mental statusand found to be in status.EEG to evaluate for seizure.  Level of alertness:Awake, asleep  AEDs during EEG study:LEV, Vmpat, PHT, Phenobarb  Technical aspects: This EEG study was done with scalp electrodes positioned according to the 10-20 International system of electrode placement. Electrical activity was acquired at a sampling rate of 500Hz  and reviewed with a high frequency filter of 70Hz  and a low frequency filter of 1Hz . EEG data were recorded continuously and digitally stored.  Description: The posterior dominant rhythm consists of10Hz  activity of moderate voltage (25-35 uV) seen predominantly in posterior head regions, symmetric and reactive to eye opening and eye closing. Sleep was characterized by vertex waves, sleep spindles (12 to 14 Hz), maximal frontocentral region. Sharp waves in left centrotemporal region were noted, a times in runs mostly during sleep. EEG also showed intermittent rhythmic 3-6hz  theta-delta slowing in left centrotemporal regionHyperventilation and photic stimulation were not performed.   ABNORMALITY - Sharp waves, left centro-temporal region - Intermittent rhythmic slow, left centro-temporal region  IMPRESSION: This study showed evidence ofepileptogenicity as well as cortical dysfunction arising from left centrotemporal region.Lateralized rhythmic slowing in left centro-temporal region  Is on the ictal-interictal continuum with low potential for seizures. No definite seizures were seen during this  study.   Lora Havens   US Abdomen Limited RUQ  Result Date: 01/14/2020 CLINICAL DATA:  Elevated liver enzymes EXAM: ULTRASOUND ABDOMEN LIMITED RIGHT UPPER  QUADRANT COMPARISON:  January 24, 2017 FINDINGS: Gallbladder: No gallstones or wall thickening visualized. No pericholecystic fluid. No sonographic Murphy sign noted by sonographer. Note that only supine imaging could be achieved due to restrictions regarding patient motion. Common bile duct: Diameter: 5 mm. No intrahepatic or extrahepatic biliary duct dilatation. Liver: No focal lesion identified. Liver echogenicity overall mildly increased. Portal vein is patent on color Doppler imaging with normal direction of blood flow towards the liver. Other: None. IMPRESSION: Mild increase in liver echogenicity may represent a degree of underlying hepatic steatosis. No focal liver lesions evident. No gallbladder pathology evident. Note that only supine imaging could be achieved due to limitations with respect patient motion for repositioning. Electronically Signed   By: Lowella Grip III M.D.   On: 01/14/2020 11:05        Scheduled Meds: . Chlorhexidine Gluconate Cloth  6 each Topical Daily  . enoxaparin (LOVENOX) injection  40 mg Subcutaneous Q24H  . influenza vac split quadrivalent PF  0.5 mL Intramuscular Tomorrow-1000  . insulin aspart  0-9 Units Subcutaneous TID WC  . lactulose  20 g Oral Daily  . mouth rinse  15 mL Mouth Rinse BID  . PHENObarbital  65 mg Intravenous BID  . phenytoin (DILANTIN) IV  100 mg Intravenous Q8H  . pyridOXINE  100 mg Intravenous Daily   Continuous Infusions: . sodium chloride 75 mL/hr at 01/14/20 1934  . lacosamide (VIMPAT) IV 200 mg (01/14/20 2148)  . levETIRAcetam 1,500 mg (01/14/20 1938)     LOS: 9 days    Time spent: 35 minutes    Donnice Nielsen A Deyci Gesell, MD [facility]   If 7PM-7AM, please contact night-coverage [email or web address]  01/15/2020, 7:40 AM

## 2020-01-15 NOTE — Progress Notes (Signed)
Initial Nutrition Assessment  DOCUMENTATION CODES:   Not applicable  INTERVENTION:  Monitor magnesium, potassium, and phosphorus daily for at least 3 days, MD to replete as needed, as pt is at risk for refeeding syndrome.   Begin via Cortrak: -Osmolite 1.2 cal @ 73ml/hr, advance 61ml/hr Q4H until goal rate of 73ml/hr is reached -66ml Prosource TF daily  At goal, tube feeding will provide 1912 kcals, 97 grams of protein, and free water   NUTRITION DIAGNOSIS:   Inadequate oral intake related to lethargy/confusion as evidenced by meal completion < 25%.    GOAL:   Patient will meet greater than or equal to 90% of their needs    MONITOR:   TF tolerance, Labs, Weight trends, I & O's  REASON FOR ASSESSMENT:   Consult Enteral/tube feeding initiation and management  ASSESSMENT:   Pt with refractory seizures with acute metabolic encephalopathy. PMH includes hepatitis and polysubstance abuse.  Pt's mentation is noted to be fluctuating.   Pt has had very poor intake since admission. Pt received Cortrak today. Tip is in stomach.  PO Intake: 0-25% x 7 recorded meals (7% average meal intake)  Labs: K+ 3.3 (L), CBGs 110-79-111 Medications: Novolog, Chronlac, B-6 injection IVF: D5 @ 51ml/hr    NUTRITION - FOCUSED PHYSICAL EXAM:  Unable to perform at this time  Diet Order:   Diet Order            Diet regular Room service appropriate? Yes; Fluid consistency: Thin  Diet effective now                 EDUCATION NEEDS:   No education needs have been identified at this time  Skin:  Skin Assessment: Reviewed RN Assessment  Last BM:  9/18  Height:   Ht Readings from Last 1 Encounters:  01/06/20 5\' 5"  (1.651 m)    Weight:   Wt Readings from Last 1 Encounters:  01/13/20 72.3 kg    BMI:  Body mass index is 26.52 kg/m.  Estimated Nutritional Needs:   Kcal:  1800-2000  Protein:  90-100 grams  Fluid:  >/=1.8L/d    01/15/20, MS, RD,  LDN RD pager number and weekend/on-call pager number located in [place].

## 2020-01-15 NOTE — Procedures (Signed)
Cortrak  Person Inserting Tube:  Amori Colomb, RD Tube Type:  Cortrak - 43 inches Tube Location:  Right nare Initial Placement:  Stomach Secured by: Bridle Technique Used to Measure Tube Placement:  Documented cm marking at nare/ corner of mouth Cortrak Secured At:  60 cm    Cortrak Tube Team Note:  Consult received to place a Cortrak feeding tube.   No x-ray is required. RN may begin using tube.   If the tube becomes dislodged please keep the tube and contact the Cortrak team at [email or web address] ([email or web address]) for replacement.  If after hours and replacement cannot be delayed, place a NG tube and confirm placement with an abdominal x-ray.    Jacek Colson MS, RDN, LDN, CNSC Registered Dietitian III Clinical Nutrition RD Pager and On-Call Pager Number Located in Amion    

## 2020-01-15 NOTE — Procedures (Addendum)
Patient Name:Anita Davis SRP:[id number] Epilepsy Attending:Pastor Sgro [name] Referring Physician/Provider:DrSrishti Bhagat Duration:01/14/2020 1656to 9/24/[id number]  Patient history:34 y.o.femalewith medical history significant forhepatitis C and polysubstance abuse whowas brought to the ED by EMS for altered mental statusand found to be in status.EEG to evaluate for seizure.  Level of alertness:Awake, asleep  AEDs during EEG study:LEV, Vmpat, PHT, Phenobarb  Technical aspects: This EEG study was done with scalp electrodes positioned according to the 10-20 International system of electrode placement. Electrical activity was acquired at a sampling rate of 500Hz  and reviewed with a high frequency filter of 70Hz  and a low frequency filter of 1Hz . EEG data were recorded continuously and digitally stored.   Description: The posterior dominant rhythm consists of10Hz  activity of moderate voltage (25-35 uV) seen predominantly in posterior head regions, symmetric and reactive to eye opening and eye closing. Sleep was characterized by vertex waves, sleep spindles (12 to 14 Hz), maximal frontocentral region.Generalized 15-18Hz  beta activity, maximal frontocentral region was seen. Hyperventilation and photic stimulation were not performed.  ABNORMALITY - Excessive beta, generalized   IMPRESSION: This study is within normal limits. No seizures and epileptiform discharges were noted throughout the study. The excessive beta activity seen in the background is most likely due to the effect of medications like benzodiazepine and is a benign EEG pattern. EEG appears to be improving compared to previous study.   Ronita Hargreaves 

## 2020-01-15 NOTE — Progress Notes (Signed)
Pt's potassium 3.3, Dr. Antionette Char informed.

## 2020-01-15 NOTE — Plan of Care (Signed)

## 2020-01-16 ENCOUNTER — Inpatient Hospital Stay (HOSPITAL_COMMUNITY): Payer: Medicaid Other

## 2020-01-16 LAB — CBC
HCT: 39.4 % (ref 36.0–46.0)
Hemoglobin: 13.8 g/dL (ref 12.0–15.0)
MCH: 30.5 pg (ref 26.0–34.0)
MCHC: 35 g/dL (ref 30.0–36.0)
MCV: 87 fL (ref 80.0–100.0)
Platelets: 151 10*3/uL (ref 150–400)
RBC: 4.53 MIL/uL (ref 3.87–5.11)
RDW: 11.4 % — ABNORMAL LOW (ref 11.5–15.5)
WBC: 2.7 10*3/uL — ABNORMAL LOW (ref 4.0–10.5)
nRBC: 0 % (ref 0.0–0.2)

## 2020-01-16 LAB — COMPREHENSIVE METABOLIC PANEL
ALT: 629 U/L — ABNORMAL HIGH (ref 0–44)
AST: 277 U/L — ABNORMAL HIGH (ref 15–41)
Albumin: 3.2 g/dL — ABNORMAL LOW (ref 3.5–5.0)
Alkaline Phosphatase: 66 U/L (ref 38–126)
Anion gap: 9 (ref 5–15)
BUN: 13 mg/dL (ref 6–20)
CO2: 26 mmol/L (ref 22–32)
Calcium: 8.7 mg/dL — ABNORMAL LOW (ref 8.9–10.3)
Chloride: 104 mmol/L (ref 98–111)
Creatinine, Ser: 0.65 mg/dL (ref 0.44–1.00)
GFR calc Af Amer: >60 mL/min (ref >60)
GFR calc non Af Amer: >60 mL/min (ref >60)
Glucose, Bld: 189 mg/dL — ABNORMAL HIGH (ref 70–99)
Potassium: 3.2 mmol/L — ABNORMAL LOW (ref 3.5–5.1)
Sodium: 139 mmol/L (ref 135–145)
Total Bilirubin: 0.8 mg/dL (ref 0.3–1.2)
Total Protein: 5.8 g/dL — ABNORMAL LOW (ref 6.5–8.1)

## 2020-01-16 LAB — GLUCOSE, CAPILLARY
Glucose-Capillary: 146 mg/dL — ABNORMAL HIGH (ref 70–99)
Glucose-Capillary: 177 mg/dL — ABNORMAL HIGH (ref 70–99)
Glucose-Capillary: 119 mg/dL — ABNORMAL HIGH (ref 70–99)
Glucose-Capillary: 162 mg/dL — ABNORMAL HIGH (ref 70–99)
Glucose-Capillary: 93 mg/dL (ref 70–99)

## 2020-01-16 LAB — PHOSPHORUS
Phosphorus: 3.3 mg/dL (ref 2.5–4.6)
Phosphorus: 3.3 mg/dL (ref 2.5–4.6)

## 2020-01-16 LAB — PHENYTOIN LEVEL, TOTAL: Phenytoin Lvl: 13.5 ug/mL (ref 10.0–20.0)

## 2020-01-16 LAB — MAGNESIUM
Magnesium: 2 mg/dL (ref 1.7–2.4)
Magnesium: 2.2 mg/dL (ref 1.7–2.4)

## 2020-01-16 MED ORDER — THIAMINE HCL 100 MG/ML IJ SOLN
100.0000 mg | Freq: Every day | INTRAMUSCULAR | Status: DC
  Administered 2020-01-16: 100 mg via INTRAVENOUS
  Filled 2020-01-16: qty 2

## 2020-01-16 MED ORDER — INSULIN ASPART 100 UNIT/ML ~~LOC~~ SOLN
2.0000 [IU] | Freq: Once | SUBCUTANEOUS | Status: AC
  Administered 2020-01-16: 2 [IU] via SUBCUTANEOUS

## 2020-01-16 MED ORDER — POTASSIUM CHLORIDE 10 MEQ/100ML IV SOLN
10.0000 meq | INTRAVENOUS | Status: AC
  Administered 2020-01-16 (×3): 10 meq via INTRAVENOUS
  Filled 2020-01-16 (×3): qty 100

## 2020-01-16 MED ORDER — POTASSIUM CHLORIDE 10 MEQ/100ML IV SOLN
10.0000 meq | Freq: Once | INTRAVENOUS | Status: AC
  Administered 2020-01-16: 10 meq via INTRAVENOUS
  Filled 2020-01-16: qty 100

## 2020-01-16 NOTE — Procedures (Addendum)
Patient Name:Anita Davis ENI:[id number] Epilepsy Attending:Santos Sollenberger [name] Referring Physician/Provider:DrSrishti Bhagat Duration:01/15/2020 1656to 9/25/[id number]  Patient history:34 y.o.femalewith medical history significant forhepatitis C and polysubstance abuse whowas brought to the ED by EMS for altered mental statusand found to be in status.EEG to evaluate for seizure.  Level of alertness:Awake, asleep  AEDs during EEG study:LEV, Vmpat, PHT, Phenobarb  Technical aspects: This EEG study was done with scalp electrodes positioned according to the 10-20 International system of electrode placement. Electrical activity was acquired at a sampling rate of 500Hz  and reviewed with a high frequency filter of 70Hz  and a low frequency filter of 1Hz . EEG data were recorded continuously and digitally stored.   Description: The posterior dominant rhythm consists of10Hz  activity of moderate voltage (25-35 uV) seen predominantly in posterior head regions, symmetric and reactive to eye opening and eye closing. Sleep was characterized by vertex waves, sleep spindles (12 to 14 Hz), maximal frontocentral region.Generalized 15-18Hz  beta activity, maximal frontocentral region was seen.Hyperventilation and photic stimulation were not performed.  ABNORMALITY - Excessive beta, generalized   IMPRESSION: This study is within normal limits. No seizures and epileptiform discharges were noted throughout the study. The excessive beta activity seen in the background is most likely due to the effect of medications like benzodiazepine and is a benign EEG pattern. EEG appears to be improving compared to previous study.   [name] 

## 2020-01-16 NOTE — Progress Notes (Signed)
PT Cancellation Note  [name] MRN: [id number] DOB: 24-May-1985   Cancelled Treatment:    Reason Eval/Treat Not Completed: Patient not medically ready (Discussed with RN; pt still unable to follow commands and not appropriate for therapy currently. Will continue to follow).    Lillia Pauls, PT, DPT Acute Rehabilitation Services Pager [phone] Office [phone]    Norval Morton 01/16/2020, 9:50 AM

## 2020-01-16 NOTE — Progress Notes (Signed)
PROGRESS NOTE    Anita Davis  HQP:[id number] DOB: Jan 16, 1986 DOA: 01/04/2020 PCP: Soyla Dryer, PA-C   Brief Narrative: 34 year old with past medical history significant for hepatitis C, polysubstance abuse brought to the hospital 9/13 for disorientation which was suspected to be substance abuse.  After she became more lucid, she was discharged, but then police brought her back later in the day and they found her passed out in a parking lot.  Urine drug screen was positive for benzodiazepine and THC, negative for methamphetamine, which patient had reportedly been taking in the past according to family.  Work-up in the emergency room otherwise unrevealing.  Patient required several doses of IV Ativan to control her agitation.  Because of her persistent altered confusion, neurology was consulted and patient was admitted to the hospitalist service.  She underwent lumbar puncture which was unrevealing.  An EEG done on 9/15 showed patient was in status epilepticus.  Patient was a started on Keppra.  Despite these, EEG noted continued seizures requiring additional medications as well.  Finally on 918, seizures broke using Ativan and fosphenytoin plus Keppra and Vimpat and Solu-Medrol.  Neurology feels that patient has likely been seizing from [place] steroid course on 9/21.  Patient herself from mentation standpoint waxes and wane.  Barely verbalize.,  Stares episodes.  -Follow-up EEG on 9/20 noted evidence of epileptic [name] from the left central temporal region.  Repeat EEG done on 9/22 noted epileptogenic city from the left central temporal region.   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 -On Vimpat, Keppra, phenobarbital and phenytoin -She also underwent LP during this hospitalization. -She was noted to have elevated ammonia level for which she was a started on lactulose. -Ammonia level ordered for today.  At 35. -ANA negative, ESR, CRP negative.  TSH normal, RPR nonreactive. -HSV spinal fluid negative.  -EEG 9/25 whiting normal limits. Plan to discontinue EEG.  -Continue with current med.  -Plan for MRI today.  -Might need psych consult as her mentation improved.   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 She has never follow-up with any outpatient providers. Right upper quadrant ultrasound; mild increase liver echogenicity may represent degree of underlying hepatic steatosis.  No focal lesion evident.  No gallbladder pathology evident. LFT trending down. Monitor on current anti seizure medications.   3-Hypokalemia: Replete IV  Magnesium normal at 2.2.  4-Nutrition: Patient with very poor oral intake.  Plan to continue with IV fluids.   Started on tube feeding 9/24. Monitor phosphorus, mg K level.  Added thiamine.   History of polysubstance abuse: Need counseling once patient is more awake and oriented Visitor restriction, need past were made by aunt, concerned about patient's boyfriend and behavior.  See notes documented by Dr. [place] Pink on 9/23  Hyperglycemia: Secondary to a steroid resolved  Leukopenia; monitor.     Estimated body mass index is 27.51 kg/m as calculated from the following:   Height as of this encounter: 5' 5"  (1.651 m).   Weight as of this encounter: 75 kg.   DVT prophylaxis: Lovenox Code Status: Full code  Family Communication: Aunt updated.  Disposition Plan:  Status is: Inpatient  Remains inpatient appropriate because:IV treatments appropriate due to intensity of illness or inability to  take PO   Dispo: The patient is from: Home              Anticipated d/c is to: to be determine               Anticipated d/c date is: 3 days              Patient currently is not medically stable to d/c.        Consultants:   Neurology   Procedures:   EEG  Antimicrobials:    Subjective: She is sleepy  Objective: Vitals:   01/16/20 0447 01/16/20 0746 01/16/20 0800 01/16/20 1158  BP:  104/77  133/85  Pulse:  80  89  Resp:  20 20 20   Temp:  99.2 F (37.3 C)  99.5 F (37.5 C)  TempSrc:  Axillary  Axillary  SpO2:  98%  97%  Weight: 75 kg     Height:        Intake/Output Summary (Last 24 hours) at 01/16/2020 1352 Last data filed at 01/16/2020 1228 Gross per 24 hour  Intake 250.28 ml  Output 2400 ml  Net -2149.72 ml   Filed Weights   01/07/20 0500 01/13/20 1615 01/16/20 0447  Weight: 70.6 kg 72.3 kg 75 kg    Examination:  General exam: NAD Respiratory system: CTA. Cardiovascular system: S 1, S 2 RRR Gastrointestinal system: BS present, soft, nt Central nervous system: sleepy Extremities:no edema   Data Reviewed: I have personally reviewed following labs and imaging studies  CBC: Recent Labs  Lab 01/10/20 1825 01/13/20 0601 01/15/20 0253 01/16/20 0601  WBC 4.0 3.2* 4.9 2.7*  NEUTROABS 2.9 1.2*  --   --   HGB 14.1 14.3 14.6 13.8  HCT 40.6 41.0 42.6 39.4  MCV 87.9 89.3 88.2 87.0  PLT 225 211 179 182   Basic Metabolic Panel: Recent Labs  Lab 01/10/20 1825 01/12/20 0456 01/13/20 0601 01/15/20 0253 01/15/20 1804 01/16/20 0601  NA 140 143 141 139  --  139  K 3.6 3.5 3.3* 3.3*  --  3.2*  CL 106 110 107 104  --  104  CO2 23 23 24 23   --  26  GLUCOSE 182* 136* 125* 103*  --  189*  BUN 20 32* 31* 28*  --  13  CREATININE 0.78 0.83 0.70 0.71  --  0.65  CALCIUM 9.1 9.1 8.9 8.4*  --  8.7*  MG  --   --   --  2.2 2.3 2.0  PHOS  --   --   --   --  2.9 3.3   GFR: Estimated Creatinine Clearance: 98.5 mL/min (by C-G formula based on SCr of 0.65  mg/dL). Liver Function Tests: Recent Labs  Lab 01/10/20 1825 01/12/20 0456 01/13/20 0601 01/15/20 0253 01/16/20 0601  AST 191* 165* 459* 310* 277*  ALT 362* 318* 739* 641* 629*  ALKPHOS 44 41 41 45 66  BILITOT 1.2 1.2 1.2 1.1 0.8  PROT 7.5 6.8 6.7 6.3* 5.8*  ALBUMIN 3.9 3.7 3.6 3.5 3.2*   No results for input(s): LIPASE, AMYLASE in the last 168 hours. Recent Labs  Lab 01/13/20 0601 01/15/20 0253 01/15/20 1804  AMMONIA 42* 35 32   Coagulation Profile: No results for input(s): INR, PROTIME in the last 168 hours. Cardiac Enzymes: Recent Labs  Lab 01/13/20  1034  CKTOTAL 145   BNP (last 3 results) No results for input(s): PROBNP in the last 8760 hours. HbA1C: No results for input(s): HGBA1C in the last 72 hours. CBG: Recent Labs  Lab 01/15/20 2116 01/15/20 2338 01/16/20 0327 01/16/20 0749 01/16/20 1157  GLUCAP 183* 179* 146* 177* 119*   Lipid Profile: No results for input(s): CHOL, HDL, LDLCALC, TRIG, CHOLHDL, LDLDIRECT in the last 72 hours. Thyroid Function Tests: No results for input(s): TSH, T4TOTAL, FREET4, T3FREE, THYROIDAB in the last 72 hours. Anemia Panel: No results for input(s): VITAMINB12, FOLATE, FERRITIN, TIBC, IRON, RETICCTPCT in the last 72 hours. Sepsis Labs: No results for input(s): PROCALCITON, LATICACIDVEN in the last 168 hours.  Recent Results (from the past 240 hour(s))  MRSA PCR Screening     Status: Abnormal   Collection Time: 01/06/20  5:12 PM   Specimen: Nasal Mucosa; Nasopharyngeal  Result Value Ref Range Status   MRSA by PCR POSITIVE (A) NEGATIVE Corrected    Comment: TETREAULT,H @0638  BY HUFFINES,S 9.16.2021         The GeneXpert MRSA Assay (FDA approved for NASAL specimens only), is one component of a comprehensive MRSA colonization surveillance program. It is not intended to diagnose MRSA infection nor to guide or monitor treatment for MRSA infections. Performed at [facility], [address], [place], [place]  [address] CORRECTED ON 09/16 AT 0654: PREVIOUSLY REPORTED AS POSITIVE        The GeneXpert MRSA Assay (FDA approved for NASAL specimens only), is one component of a comprehensive MRSA colonization surveillance program. It is not intended to diagnose MRSA infection  nor to guide or monitor treatment for MRSA infections. RESULT CALLED TO, READ BACK BY AND VERIFIED WITH: [place]          Radiology Studies: No results found.      Scheduled Meds: . Chlorhexidine Gluconate Cloth  6 each Topical Daily  . enoxaparin (LOVENOX) injection  40 mg Subcutaneous Q24H  . feeding supplement (PROSource TF)  45 mL Per Tube Daily  . influenza vac split quadrivalent PF  0.5 mL Intramuscular Tomorrow-1000  . insulin aspart  0-9 Units Subcutaneous TID WC  . lactulose  20 g Oral Daily  . mouth rinse  15 mL Mouth Rinse BID  . PHENObarbital  65 mg Intravenous BID  . phenytoin (DILANTIN) IV  100 mg Intravenous Q8H  . pyridOXINE  100 mg Intravenous Daily   Continuous Infusions: . dextrose 5% lactated ringers 75 mL/hr at 01/15/20 1846  . feeding supplement (OSMOLITE 1.2 CAL) 1,000 mL (01/15/20 1809)  . lacosamide (VIMPAT) IV 200 mg (01/16/20 1032)  . levETIRAcetam 1,500 mg (01/16/20 0715)     LOS: 10 days    Time spent: 35 minutes    Blayne Garlick A Tracer Gutridge, MD [facility]   If 7PM-7AM, please contact night-coverage [email or web address]  01/16/2020, 1:52 PM

## 2020-01-16 NOTE — Progress Notes (Signed)
LTM EEG discontinued - no skin breakdown at unhook.   

## 2020-01-16 NOTE — Progress Notes (Signed)
NEURO HOSPITALIST PROGRESS NOTE   Subjective: No acute changes EEG: Normal.  No seizures.  Excessive beta likely due to medication effect.  Exam: Vitals:   01/16/20 0746 01/16/20 0800  BP: 104/77   Pulse: 80   Resp: 20 20  Temp: 99.2 F (37.3 C)   SpO2: 98%     On my examination today: General: Awake, looking around. HEENT: Normocephalic atraumatic CVS: Regular rate rhythm Extremities warm well perfused Respiratory breathing well and saturating normally on room air Neurological exam Awake, alert Does not follow commands Does not verbalize Cranial nerves: 2-12 appear intact with the limitation of the exam due to mentation Motor exam: Moving all fours equally Sensory exam withdrawing all 4 symmetrically. Coordination difficult to assess given her mentation    Medications: I have reviewed the patient's current medications. Scheduled Meds: . Chlorhexidine Gluconate Cloth  6 each Topical Daily  . enoxaparin (LOVENOX) injection  40 mg Subcutaneous Q24H  . feeding supplement (PROSource TF)  45 mL Per Tube Daily  . influenza vac split quadrivalent PF  0.5 mL Intramuscular Tomorrow-1000  . insulin aspart  0-9 Units Subcutaneous TID WC  . lactulose  20 g Oral Daily  . mouth rinse  15 mL Mouth Rinse BID  . PHENObarbital  65 mg Intravenous BID  . phenytoin (DILANTIN) IV  100 mg Intravenous Q8H  . pyridOXINE  100 mg Intravenous Daily   Continuous Infusions: . dextrose 5% lactated ringers 75 mL/hr at 01/15/20 1846  . feeding supplement (OSMOLITE 1.2 CAL) 1,000 mL (01/15/20 1809)  . lacosamide (VIMPAT) IV 200 mg (01/15/20 2256)  . levETIRAcetam 1,500 mg (01/15/20 2030)  . potassium chloride     PRN Meds:.LORazepam, LORazepam **OR** [DISCONTINUED] LORazepam, ondansetron (ZOFRAN) IV  Scheduled Meds: Scheduled Meds: . Chlorhexidine Gluconate Cloth  6 each Topical Daily  . enoxaparin (LOVENOX) injection  40 mg Subcutaneous Q24H  . feeding supplement  (PROSource TF)  45 mL Per Tube Daily  . influenza vac split quadrivalent PF  0.5 mL Intramuscular Tomorrow-1000  . insulin aspart  0-9 Units Subcutaneous TID WC  . lactulose  20 g Oral Daily  . mouth rinse  15 mL Mouth Rinse BID  . PHENObarbital  65 mg Intravenous BID  . phenytoin (DILANTIN) IV  100 mg Intravenous Q8H  . pyridOXINE  100 mg Intravenous Daily   Continuous Infusions: . dextrose 5% lactated ringers 75 mL/hr at 01/15/20 1846  . feeding supplement (OSMOLITE 1.2 CAL) 1,000 mL (01/15/20 1809)  . lacosamide (VIMPAT) IV 200 mg (01/15/20 2256)  . levETIRAcetam 1,500 mg (01/15/20 2030)  . potassium chloride     PRN Meds:.LORazepam, LORazepam **OR** [DISCONTINUED] LORazepam, ondansetron (ZOFRAN) IV   Pertinent Labs/Diagnostics:   US Abdomen Limited RUQ  Result Date: 01/14/2020 CLINICAL DATA:  Elevated liver enzymes EXAM: ULTRASOUND ABDOMEN LIMITED RIGHT UPPER QUADRANT COMPARISON:  January 24, 2017 FINDINGS: Gallbladder: No gallstones or wall thickening visualized. No pericholecystic fluid. No sonographic Murphy sign noted by sonographer. Note that only supine imaging could be achieved due to restrictions regarding patient motion. Common bile duct: Diameter: 5 mm. No intrahepatic or extrahepatic biliary duct dilatation. Liver: No focal lesion identified. Liver echogenicity overall mildly increased. Portal vein is patent on color Doppler imaging with normal direction of blood flow towards the liver. Other: None. IMPRESSION: Mild increase in liver echogenicity may represent a degree of underlying hepatic steatosis. No  focal liver lesions evident. No gallbladder pathology evident. Note that only supine imaging could be achieved due to limitations with respect patient motion for repositioning. Electronically Signed   By: Bretta Bang III M.D.   On: 01/14/2020 11:05   Assessment:  34 year old woman with a past history of hepatitis C and polysubstance abuse presented to [facility]  01/04/2020 with suspected methamphetamine use-discharged and brought back the same day with confusion and agitation.  EEG consistent with new onset refractory status epilepticus-did not break with Ativan and fosphenytoin with some improvement after further addition of antiepileptics. Also received 5 doses of IV Solu-Medrol-CSF extremely bland and autoimmune work-up pending. Today's exam is better than the previous 2 to 3 days of exam and I think that that is probably due to the steroids taking effect as well as medications helping with her brain irritability-EEG today did not show any sharp waves.  HSV PCR negative  Impression: Seizure: Resolved on EEG.  Continues to be encephalopathic.  Recommendations:  continue phenytoin 100 mg 3 times daily continue Keppra 1500 mg twice daily continue Vimpat 200 mg twice daily Continue phenobarb 65 mg twice daily Discontinue LTM EEG MRI brain with and without contrast-had a study done on 9/14 but it was incomplete.  Following pending labs:   serum autoimmune epilepsy panel CSF HSV-negative-discontinue acyclovir yesterday.  Wonder if part of the presentation is only due to clinical exam lagging the lab and diagnostic test finding or maybe there is also a component of underlying psychiatric illness.  My try to consider psychiatry consult as her mentation is improving.  We will continue to follow with you.   Plan discussed with Dr. Deloria Lair  -- Milon Dikes, MD [facility] Pager: [phone] If 7pm to 7am, please call on call as listed on AMION.

## 2020-01-17 LAB — COMPREHENSIVE METABOLIC PANEL
ALT: 472 U/L — ABNORMAL HIGH (ref 0–44)
AST: 135 U/L — ABNORMAL HIGH (ref 15–41)
Albumin: 3.1 g/dL — ABNORMAL LOW (ref 3.5–5.0)
Alkaline Phosphatase: 74 U/L (ref 38–126)
Anion gap: 11 (ref 5–15)
BUN: 12 mg/dL (ref 6–20)
CO2: 26 mmol/L (ref 22–32)
Calcium: 8.7 mg/dL — ABNORMAL LOW (ref 8.9–10.3)
Chloride: 102 mmol/L (ref 98–111)
Creatinine, Ser: 0.67 mg/dL (ref 0.44–1.00)
GFR calc Af Amer: >60 mL/min (ref >60)
GFR calc non Af Amer: >60 mL/min (ref >60)
Glucose, Bld: 146 mg/dL — ABNORMAL HIGH (ref 70–99)
Potassium: 3.8 mmol/L (ref 3.5–5.1)
Sodium: 139 mmol/L (ref 135–145)
Total Bilirubin: 0.7 mg/dL (ref 0.3–1.2)
Total Protein: 5.9 g/dL — ABNORMAL LOW (ref 6.5–8.1)

## 2020-01-17 LAB — GLUCOSE, CAPILLARY
Glucose-Capillary: 129 mg/dL — ABNORMAL HIGH (ref 70–99)
Glucose-Capillary: 176 mg/dL — ABNORMAL HIGH (ref 70–99)
Glucose-Capillary: 179 mg/dL — ABNORMAL HIGH (ref 70–99)
Glucose-Capillary: 183 mg/dL — ABNORMAL HIGH (ref 70–99)
Glucose-Capillary: 183 mg/dL — ABNORMAL HIGH (ref 70–99)
Glucose-Capillary: 196 mg/dL — ABNORMAL HIGH (ref 70–99)

## 2020-01-17 LAB — CBC
HCT: 41 % (ref 36.0–46.0)
Hemoglobin: 14.2 g/dL (ref 12.0–15.0)
MCH: 30.8 pg (ref 26.0–34.0)
MCHC: 34.6 g/dL (ref 30.0–36.0)
MCV: 88.9 fL (ref 80.0–100.0)
Platelets: 169 10*3/uL (ref 150–400)
RBC: 4.61 MIL/uL (ref 3.87–5.11)
RDW: 11.9 % (ref 11.5–15.5)
WBC: 4.1 10*3/uL (ref 4.0–10.5)
nRBC: 0 % (ref 0.0–0.2)

## 2020-01-17 MED ORDER — THIAMINE HCL 100 MG/ML IJ SOLN
100.0000 mg | Freq: Every day | INTRAMUSCULAR | Status: DC
  Administered 2020-01-20 – 2020-01-25 (×6): 100 mg via INTRAVENOUS
  Filled 2020-01-17 (×7): qty 2

## 2020-01-17 MED ORDER — THIAMINE HCL 100 MG/ML IJ SOLN
500.0000 mg | Freq: Every day | INTRAVENOUS | Status: AC
  Administered 2020-01-17 – 2020-01-19 (×3): 500 mg via INTRAVENOUS
  Filled 2020-01-17 (×3): qty 5

## 2020-01-17 NOTE — Progress Notes (Signed)
NEURO HOSPITALIST PROGRESS NOTE   Subjective: No acute changes overnight  Exam: Vitals:   01/16/20 2351 01/17/20 0350  BP: 108/79 100/75  Pulse: 88 96  Resp: 19 18  Temp: 98.6 F (37 C) 98.4 F (36.9 C)  SpO2: 98% 97%    On my examination today: Unchanged from yesterday General: Awake, looking around. HEENT: Normocephalic atraumatic CVS: Regular rate rhythm Extremities warm well perfused Respiratory breathing well and saturating normally on room air Neurological exam Awake, alert Does not follow commands Does not verbalize.  No verbal output noted today. Cranial nerves: 2-12 appear intact with the limitation of the exam due to mentation Motor exam: Moving all fours equally Sensory exam withdrawing all 4 symmetrically. Coordination difficult to assess given her mentation    Medications: I have reviewed the patient's current medications. Scheduled Meds: . Chlorhexidine Gluconate Cloth  6 each Topical Daily  . enoxaparin (LOVENOX) injection  40 mg Subcutaneous Q24H  . feeding supplement (PROSource TF)  45 mL Per Tube Daily  . influenza vac split quadrivalent PF  0.5 mL Intramuscular Tomorrow-1000  . insulin aspart  0-9 Units Subcutaneous TID WC  . lactulose  20 g Oral Daily  . mouth rinse  15 mL Mouth Rinse BID  . PHENObarbital  65 mg Intravenous BID  . phenytoin (DILANTIN) IV  100 mg Intravenous Q8H  . pyridOXINE  100 mg Intravenous Daily  . [START ON 01/20/2020] thiamine injection  100 mg Intravenous Daily   Continuous Infusions: . dextrose 5% lactated ringers 75 mL/hr at 01/15/20 1846  . feeding supplement (OSMOLITE 1.2 CAL) 1,000 mL (01/15/20 1809)  . lacosamide (VIMPAT) IV 200 mg (01/16/20 2140)  . levETIRAcetam 1,500 mg (01/17/20 0745)  . thiamine injection     PRN Meds:.LORazepam, LORazepam **OR** [DISCONTINUED] LORazepam, ondansetron (ZOFRAN) IV  Scheduled Meds: Scheduled Meds: . Chlorhexidine Gluconate Cloth  6 each Topical Daily   . enoxaparin (LOVENOX) injection  40 mg Subcutaneous Q24H  . feeding supplement (PROSource TF)  45 mL Per Tube Daily  . influenza vac split quadrivalent PF  0.5 mL Intramuscular Tomorrow-1000  . insulin aspart  0-9 Units Subcutaneous TID WC  . lactulose  20 g Oral Daily  . mouth rinse  15 mL Mouth Rinse BID  . PHENObarbital  65 mg Intravenous BID  . phenytoin (DILANTIN) IV  100 mg Intravenous Q8H  . pyridOXINE  100 mg Intravenous Daily  . [START ON 01/20/2020] thiamine injection  100 mg Intravenous Daily   Continuous Infusions: . dextrose 5% lactated ringers 75 mL/hr at 01/15/20 1846  . feeding supplement (OSMOLITE 1.2 CAL) 1,000 mL (01/15/20 1809)  . lacosamide (VIMPAT) IV 200 mg (01/16/20 2140)  . levETIRAcetam 1,500 mg (01/17/20 0745)  . thiamine injection     PRN Meds:.LORazepam, LORazepam **OR** [DISCONTINUED] LORazepam, ondansetron (ZOFRAN) IV   Pertinent Labs/Diagnostics:   MR BRAIN WO CONTRAST  Result Date: 01/16/2020 CLINICAL DATA:  Seizure, altered mental status. EXAM: MRI HEAD WITHOUT CONTRAST TECHNIQUE: Multiplanar, multiecho pulse sequences of the brain and surrounding structures were obtained without intravenous contrast. COMPARISON:  01/10/2020 head CT.  01/05/2020 MRI head. FINDINGS: Please note that motion artifact limits evaluation. Brain: No diffusion-weighted signal abnormality. No intracranial hemorrhage. Cerebral volume is within normal limits. No midline shift, ventriculomegaly or extra-axial fluid collection. No mass lesion. Vascular: Grossly preserved major intracranial flow voids. Skull and upper cervical spine: Normal  marrow signal. Sinuses/Orbits: Normal orbits. Clear paranasal sinuses. No mastoid effusion. Other: None. IMPRESSION: No acute intracranial process. Motion degradation limits evaluation. Electronically Signed   By: Stana Bunting M.D.   On: 01/16/2020 21:36    Imaging reviewed by me personally: MRI brain with no acute changes.  I have also  discussed this MRI with the radiologist on-call for a second look to see if there is any nuances that we might be missing but it looks normal.  Assessment:  34 year old woman with a past history of hepatitis C and polysubstance abuse presented to [facility] 01/04/2020 with suspected methamphetamine use-discharged and brought back the same day with confusion and agitation. Toxicology consistent only with benzodiazepine and THC and not on any stimulants.  On presentation EEG consistent with new onset refractory status epilepticus-did not break with Ativan and fosphenytoin with some improvement after further addition of antiepileptics.  Also received 5 doses of IV Solu-Medrol-CSF extremely bland and autoimmune work-up pending.  [facility] encephalopathy panel still pending.  Exam waxing and waning but mostly has remained to a point where she is awake alert and tracking the examiner without verbalizing or following commands.  Only 1 day of these past 5 days that have seen her, she was able to answer back when I asked her how she is doing, she said I am okay how are you.  HSV PCR negative  Imaging was completed but was motion riddled-MRI brain did not reveal any acute changes.  Impression: Seizure: Resolved on EEG.  Continues to be encephalopathic.  Unclear etiology.  Recommendations:  continue phenytoin 100 mg 3 times daily continue Keppra 1500 mg twice daily continue Vimpat 200 mg twice daily Continue phenobarb 65 mg twice daily  Frequent neurochecks  Following pending labs:   serum autoimmune epilepsy panel CSF HSV-negative-discontinue acyclovir yesterday.  Wonder if part of the presentation is only due to clinical exam lagging the lab and diagnostic test finding or maybe there is also a component of underlying psychiatric illness.  Might try to consider psychiatry consult with improving mentation over the next few days  Neurology will continue to follow with you.   Plan  discussed with Dr. Sunnie Nielsen  -- Milon Dikes, MD [facility] Pager: [phone] If 7pm to 7am, please call on call as listed on AMION.

## 2020-01-17 NOTE — Progress Notes (Signed)
PROGRESS NOTE    Anita Davis  OEU:[id number] DOB: Aug 26, 1985 DOA: 01/04/2020 PCP: Soyla Dryer, PA-C   Brief Narrative: 34 year old with past medical history significant for hepatitis C, polysubstance abuse brought to the hospital 9/13 for disorientation which was suspected to be substance abuse.  After she became more lucid, she was discharged, but then police brought her back later in the day and they found her passed out in a parking lot.  Urine drug screen was positive for benzodiazepine and THC, negative for methamphetamine, which patient had reportedly been taking in the past according to family.  Work-up in the emergency room otherwise unrevealing.  Patient required several doses of IV Ativan to control her agitation.  Because of her persistent altered confusion, neurology was consulted and patient was admitted to the hospitalist service.  She underwent lumbar puncture which was unrevealing.  An EEG done on 9/15 showed patient was in status epilepticus.  Patient was a started on Keppra.  Despite these, EEG noted continued seizures requiring additional medications as well.  Finally on 918, seizures broke using Ativan and fosphenytoin plus Keppra and Vimpat and Solu-Medrol.  Neurology feels that patient has likely been seizing from [place] steroid course on 9/21.  Patient herself from mentation standpoint waxes and wane.  Barely verbalize.,  Stares episodes.  -Follow-up EEG on 9/20 noted evidence of epileptic [name] from the left central temporal region.  Repeat EEG done on 9/22 noted epileptogenic city from the left central temporal region.   -Neurology continue to follow and adjust medications.     Assessment & Plan:   Principal Problem:   Refractory seizure ([place]) Active Problems:   Acute metabolic encephalopathy   Hypokalemia   Polysubstance abuse (HCC)   Elevated CK   Transaminitis   Chronic hepatitis C without hepatic coma (HCC)   1-Refractory seizures with acute metabolic  encephalopathy: -Patient was  long-term monitoring during this admission. - -Patient non verbal.  -On Vimpat, Keppra, phenobarbital and phenytoin -She also underwent LP during this hospitalization, unrevealing. -She was noted to have elevated ammonia level for which she was a started on lactulose. -Ammonia level ordered for today.  At 35. -ANA negative, ESR, CRP negative.  TSH normal, RPR nonreactive. -HSV spinal fluid negative.  -EEG 9/25 whiting normal limits. Plan to discontinue EEG.  -Continue with current med.  -MRI negative for stroke.  -Might need psych consult as her mentation improved.  -She is more alert today, non verbal.   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 She has never follow-up with any outpatient providers. Right upper quadrant ultrasound; mild increase liver echogenicity may represent degree of underlying hepatic steatosis.  No focal lesion evident.  No gallbladder pathology evident. LFT trending down. Monitor on current anti seizure medications.  Continue to decreased.   3-Hypokalemia: Resolved.  Magnesium normal at 2.2.  4-Nutrition: Patient with very poor oral intake.  Plan to continue with IV fluids.   Started on tube feeding 9/24. Monitor phosphorus, mg K level.  Added thiamine 500 mg IV for 3 days then 100 mg daily.    History of polysubstance abuse: Need counseling once patient is more awake and oriented Visitor restriction, need past were made by aunt, concerned about patient's boyfriend and behavior.  See notes documented by Dr. [place] Pink on 9/23  Hyperglycemia: Secondary to a steroid resolved  Leukopenia; resolved.     Estimated body mass index is 27.26 kg/m as calculated from the following:   Height as of this encounter:  5' 5"  (1.651 m).   Weight as of this encounter: 74.3 kg.   DVT prophylaxis: Lovenox Code Status: Full code Family Communication: Aunt updated.  Disposition Plan:  Status is:  Inpatient  Remains inpatient appropriate because:IV treatments appropriate due to intensity of illness or inability to take PO   Dispo: The patient is from: Home              Anticipated d/c is to: to be determine               Anticipated d/c date is: 3 days              Patient currently is not medically stable to d/c.        Consultants:   Neurology   Procedures:   EEG  Antimicrobials:    Subjective: She is alert, non verbal. Per nurse patient said "I want my mom"  Objective: Vitals:   01/16/20 2025 01/16/20 2351 01/17/20 0350 01/17/20 0939  BP: (!) 132/94 108/79 100/75 108/80  Pulse: (!) 101 88 96 94  Resp: 20 19 18 20   Temp: 98.5 F (36.9 C) 98.6 F (37 C) 98.4 F (36.9 C) 99.7 F (37.6 C)  TempSrc: Oral Oral Oral Axillary  SpO2: 99% 98% 97% 100%  Weight:   74.3 kg   Height:        Intake/Output Summary (Last 24 hours) at 01/17/2020 1059 Last data filed at 01/16/2020 1811 Gross per 24 hour  Intake 2134.83 ml  Output 1750 ml  Net 384.83 ml   Filed Weights   01/13/20 1615 01/16/20 0447 01/17/20 0350  Weight: 72.3 kg 75 kg 74.3 kg    Examination:  General exam: NAD Respiratory system: CTA Cardiovascular system: S 1, S 2 RRR Gastrointestinal system: BBS present, soft, nt Central nervous system: alert, non verbal, not following command.  Extremities:no edema   Data Reviewed: I have personally reviewed following labs and imaging studies  CBC: Recent Labs  Lab 01/10/20 1825 01/13/20 0601 01/15/20 0253 01/16/20 0601 01/17/20 0503  WBC 4.0 3.2* 4.9 2.7* 4.1  NEUTROABS 2.9 1.2*  --   --   --   HGB 14.1 14.3 14.6 13.8 14.2  HCT 40.6 41.0 42.6 39.4 41.0  MCV 87.9 89.3 88.2 87.0 88.9  PLT 225 211 179 151 456   Basic Metabolic Panel: Recent Labs  Lab 01/12/20 0456 01/13/20 0601 01/15/20 0253 01/15/20 1804 01/16/20 0601 01/16/20 1656 01/17/20 0503  NA 143 141 139  --  139  --  139  K 3.5 3.3* 3.3*  --  3.2*  --  3.8  CL 110 107 104   --  104  --  102  CO2 23 24 23   --  26  --  26  GLUCOSE 136* 125* 103*  --  189*  --  146*  BUN 32* 31* 28*  --  13  --  12  CREATININE 0.83 0.70 0.71  --  0.65  --  0.67  CALCIUM 9.1 8.9 8.4*  --  8.7*  --  8.7*  MG  --   --  2.2 2.3 2.0 2.2  --   PHOS  --   --   --  2.9 3.3 3.3  --    GFR: Estimated Creatinine Clearance: 98.1 mL/min (by C-G formula based on SCr of 0.67 mg/dL). Liver Function Tests: Recent Labs  Lab 01/12/20 0456 01/13/20 0601 01/15/20 0253 01/16/20 0601 01/17/20 0503  AST 165* 459* 310* 277* 135*  ALT 318* 739* 641* 629* 472*  ALKPHOS 41 41 45 66 74  BILITOT 1.2 1.2 1.1 0.8 0.7  PROT 6.8 6.7 6.3* 5.8* 5.9*  ALBUMIN 3.7 3.6 3.5 3.2* 3.1*   No results for input(s): LIPASE, AMYLASE in the last 168 hours. Recent Labs  Lab 01/13/20 0601 01/15/20 0253 01/15/20 1804  AMMONIA 42* 35 32   Coagulation Profile: No results for input(s): INR, PROTIME in the last 168 hours. Cardiac Enzymes: Recent Labs  Lab 01/13/20 1034  CKTOTAL 145   BNP (last 3 results) No results for input(s): PROBNP in the last 8760 hours. HbA1C: No results for input(s): HGBA1C in the last 72 hours. CBG: Recent Labs  Lab 01/16/20 1551 01/16/20 2023 01/17/20 0013 01/17/20 0352 01/17/20 0938  GLUCAP 162* 93 196* 176* 179*   Lipid Profile: No results for input(s): CHOL, HDL, LDLCALC, TRIG, CHOLHDL, LDLDIRECT in the last 72 hours. Thyroid Function Tests: No results for input(s): TSH, T4TOTAL, FREET4, T3FREE, THYROIDAB in the last 72 hours. Anemia Panel: No results for input(s): VITAMINB12, FOLATE, FERRITIN, TIBC, IRON, RETICCTPCT in the last 72 hours. Sepsis Labs: No results for input(s): PROCALCITON, LATICACIDVEN in the last 168 hours.  No results found for this or any previous visit (from the past 240 hour(s)).       Radiology Studies: MR BRAIN WO CONTRAST  Result Date: 01/16/2020 CLINICAL DATA:  Seizure, altered mental status. EXAM: MRI HEAD WITHOUT CONTRAST  TECHNIQUE: Multiplanar, multiecho pulse sequences of the brain and surrounding structures were obtained without intravenous contrast. COMPARISON:  01/10/2020 head CT.  01/05/2020 MRI head. FINDINGS: Please note that motion artifact limits evaluation. Brain: No diffusion-weighted signal abnormality. No intracranial hemorrhage. Cerebral volume is within normal limits. No midline shift, ventriculomegaly or extra-axial fluid collection. No mass lesion. Vascular: Grossly preserved major intracranial flow voids. Skull and upper cervical spine: Normal marrow signal. Sinuses/Orbits: Normal orbits. Clear paranasal sinuses. No mastoid effusion. Other: None. IMPRESSION: No acute intracranial process. Motion degradation limits evaluation. Electronically Signed   By: Primitivo Gauze M.D.   On: 01/16/2020 21:36        Scheduled Meds: . Chlorhexidine Gluconate Cloth  6 each Topical Daily  . enoxaparin (LOVENOX) injection  40 mg Subcutaneous Q24H  . feeding supplement (PROSource TF)  45 mL Per Tube Daily  . influenza vac split quadrivalent PF  0.5 mL Intramuscular Tomorrow-1000  . insulin aspart  0-9 Units Subcutaneous TID WC  . lactulose  20 g Oral Daily  . mouth rinse  15 mL Mouth Rinse BID  . PHENObarbital  65 mg Intravenous BID  . phenytoin (DILANTIN) IV  100 mg Intravenous Q8H  . pyridOXINE  100 mg Intravenous Daily  . [START ON 01/20/2020] thiamine injection  100 mg Intravenous Daily   Continuous Infusions: . dextrose 5% lactated ringers 75 mL/hr at 01/15/20 1846  . feeding supplement (OSMOLITE 1.2 CAL) 1,000 mL (01/15/20 1809)  . lacosamide (VIMPAT) IV 200 mg (01/17/20 1010)  . levETIRAcetam 1,500 mg (01/17/20 0745)  . thiamine injection       LOS: 11 days    Time spent: 35 minutes    Morty Ortwein A Kady Toothaker, MD [facility]   If 7PM-7AM, please contact night-coverage [email or web address]  01/17/2020, 10:59 AM

## 2020-01-17 NOTE — Evaluation (Signed)
Physical Therapy Evaluation [name] MRN: [id number] DOB: [birth year] Today's Date: 01/17/2020   History of Present Illness  34 year old with past medical history significant for hepatitis C, polysubstance abuse brought to the hospital 9/13 for disorientation which was suspected to be substance abuse.  After she became more lucid, she was discharged, but then police brought her back later in the day and they found her passed out in a parking lot.  Urine drug screen was positive for benzodiazepine and THC. An EEG done on 9/15 showed patient was in status epilepticus.  Patient was a started on Keppra.  Despite these, EEG noted continued seizures requiring additional medications as well.  Finally on 918, seizures broke. Follow-up EEG on 9/20 noted evidence of epileptic [name] from the left central temporal region.  Repeat EEG done on 9/22 noted epileptogenic city from the left central temporal region.  Clinical Impression  Pt presents to PT with deficits in cognition, communication, functional mobility, balance, gait. Pt demonstrates significant aphasia, does not appear to comprehend PT commands and is unable to effectively communicate at this time. Pt does not follow commands but does follow through with some mobility tasks after PT physically initiates. Pt will benefit from 1x/wk PT trail to assess if the pt is able to improve her command following and ability to participate in skilled PT intervention. If the pt remains unable to effectively participate and follow commands then PT will sign off. PT recommends SNF placement at the time of discharge and mechanical lift for all transfers.    Follow Up Recommendations SNF;Supervision/Assistance - 24 hour    Equipment Recommendations  Wheelchair (measurements PT);Wheelchair cushion (measurements PT);Hospital bed (mechanical lift, all if home today)    Recommendations for Other Services       Precautions / Restrictions  Precautions Precautions: Fall Precaution Comments: cortrak Restrictions Weight Bearing Restrictions: No      Mobility  Bed Mobility Overal bed mobility: Needs Assistance Bed Mobility: Supine to Sit;Sit to Sidelying;Rolling Rolling: Total assist   Supine to sit: Max assist;HOB elevated   Sit to sidelying: Total assist General bed mobility comments: pt follows through on supine to sit after PT begins to initiate, moves LE to edge and activates trunk some to sit  Transfers Overall transfer level: Needs assistance Equipment used: 1 person hand held assist Transfers: Sit to/from Stand Sit to Stand: Max assist         General transfer comment: pt activates LE to assist in stand after PT initiation, however pt quickly begins to resist once in standing and attempts to sit during both standing trials  Ambulation/Gait                Stairs            Wheelchair Mobility    Modified Rankin (Stroke Patients Only)       Balance Overall balance assessment: Needs assistance Sitting-balance support: No upper extremity supported;Feet supported Sitting balance-Leahy Scale: Poor Sitting balance - Comments: min-modA, increased sway in all directions Postural control: Posterior lean;Right lateral lean;Left lateral lean Standing balance support: Bilateral upper extremity supported Standing balance-Leahy Scale: Zero Standing balance comment: maxA-totalA to stand                             Pertinent Vitals/Pain Pain Assessment: Faces Faces Pain Scale: Hurts even more Pain Location: generalized Pain Descriptors / Indicators: Discomfort Pain Intervention(s): Monitored during session    Home  Living Family/patient expects to be discharged to:: Unsure                 Additional Comments: pt unable to report history 2/2 aphasia    Prior Function           Comments: unable to determine, pt is aphasic and no family present     Hand Dominance         Extremity/Trunk Assessment   Upper Extremity Assessment Upper Extremity Assessment: Generalized weakness;Difficult to assess due to impaired cognition    Lower Extremity Assessment Lower Extremity Assessment: Generalized weakness;Difficult to assess due to impaired cognition    Cervical / Trunk Assessment Cervical / Trunk Assessment: Normal  Communication   Communication: Expressive difficulties;Receptive difficulties  Cognition Arousal/Alertness: Awake/alert Behavior During Therapy: Restless Overall Cognitive Status: No family/caregiver present to determine baseline cognitive functioning                                 General Comments: pt is aphasic, does not follow commands. Pt appears uncomfortable throughout session regardless of position. Pt follows through with some mobility tasks once PT initiates with tactile cues and physical assistance      General Comments General comments (skin integrity, edema, etc.): VSS on RA, BP stable with change in position    Exercises     Assessment/Plan    PT Assessment Patient needs continued PT services (PT trial)  PT Problem List Decreased strength;Decreased activity tolerance;Decreased balance;Decreased mobility;Decreased cognition;Decreased knowledge of use of DME;Decreased safety awareness;Decreased knowledge of precautions       PT Treatment Interventions Functional mobility training;Therapeutic activities;Therapeutic exercise;Balance training;Neuromuscular re-education;Patient/family education;Cognitive remediation;Wheelchair mobility training;Gait training    PT Goals (Current goals can be found in the Care Plan section)  Acute Rehab PT Goals Patient Stated Goal: pt unable to state, PT goal to follow ~50% of PT commands and improve participation in PT PT Goal Formulation: Patient unable to participate in goal setting Potential to Achieve Goals: Poor    Frequency Min 1X/week (1x/week trial to assess if  pt can participate in PT)   Barriers to discharge        Co-evaluation               AM-PAC PT "6 Clicks" Mobility  Outcome Measure Help needed turning from your back to your side while in a flat bed without using bedrails?: Total Help needed moving from lying on your back to sitting on the side of a flat bed without using bedrails?: Total Help needed moving to and from a bed to a chair (including a wheelchair)?: Total Help needed standing up from a chair using your arms (e.g., wheelchair or bedside chair)?: Total Help needed to walk in hospital room?: Total Help needed climbing 3-5 steps with a railing? : Total 6 Click Score: 6    End of Session   Activity Tolerance: Other (comment) (treatment limited to cognition deficits and aphasia) Patient left: in bed;with call bell/phone within reach;with bed alarm set;with restraints reapplied Nurse Communication: Mobility status;Need for lift equipment PT Visit Diagnosis: Unsteadiness on feet (R26.81);Muscle weakness (generalized) (M62.81);Other symptoms and signs involving the nervous system (D14.970)    Time: 2637-8588 PT Time Calculation (min) (ACUTE ONLY): 19 min   Charges:   PT Evaluation $PT Eval Moderate Complexity: 1 Mod          Arlyss Gandy, PT, DPT Acute Rehabilitation Pager: [phone]   Alycia Rossetti  Brita Romp 01/17/2020, 1:40 PM

## 2020-01-18 ENCOUNTER — Inpatient Hospital Stay (HOSPITAL_COMMUNITY): Payer: Medicaid Other

## 2020-01-18 LAB — CBC
HCT: 38.8 % (ref 36.0–46.0)
Hemoglobin: 13.1 g/dL (ref 12.0–15.0)
MCH: 30.2 pg (ref 26.0–34.0)
MCHC: 33.8 g/dL (ref 30.0–36.0)
MCV: 89.4 fL (ref 80.0–100.0)
Platelets: 120 10*3/uL — ABNORMAL LOW (ref 150–400)
RBC: 4.34 MIL/uL (ref 3.87–5.11)
RDW: 11.9 % (ref 11.5–15.5)
WBC: 3.7 10*3/uL — ABNORMAL LOW (ref 4.0–10.5)
nRBC: 0 % (ref 0.0–0.2)

## 2020-01-18 LAB — COMPREHENSIVE METABOLIC PANEL
ALT: 350 U/L — ABNORMAL HIGH (ref 0–44)
AST: 117 U/L — ABNORMAL HIGH (ref 15–41)
Albumin: 3 g/dL — ABNORMAL LOW (ref 3.5–5.0)
Alkaline Phosphatase: 67 U/L (ref 38–126)
Anion gap: 8 (ref 5–15)
BUN: 9 mg/dL (ref 6–20)
CO2: 27 mmol/L (ref 22–32)
Calcium: 8.9 mg/dL (ref 8.9–10.3)
Chloride: 105 mmol/L (ref 98–111)
Creatinine, Ser: 0.72 mg/dL (ref 0.44–1.00)
GFR calc Af Amer: >60 mL/min (ref >60)
GFR calc non Af Amer: >60 mL/min (ref >60)
Glucose, Bld: 178 mg/dL — ABNORMAL HIGH (ref 70–99)
Potassium: 4.2 mmol/L (ref 3.5–5.1)
Sodium: 140 mmol/L (ref 135–145)
Total Bilirubin: 0.8 mg/dL (ref 0.3–1.2)
Total Protein: 5.6 g/dL — ABNORMAL LOW (ref 6.5–8.1)

## 2020-01-18 LAB — GLUCOSE, CAPILLARY
Glucose-Capillary: 151 mg/dL — ABNORMAL HIGH (ref 70–99)
Glucose-Capillary: 180 mg/dL — ABNORMAL HIGH (ref 70–99)
Glucose-Capillary: 156 mg/dL — ABNORMAL HIGH (ref 70–99)
Glucose-Capillary: 180 mg/dL — ABNORMAL HIGH (ref 70–99)
Glucose-Capillary: 177 mg/dL — ABNORMAL HIGH (ref 70–99)
Glucose-Capillary: 155 mg/dL — ABNORMAL HIGH (ref 70–99)

## 2020-01-18 MED ORDER — LORAZEPAM 2 MG/ML IJ SOLN
2.0000 mg | INTRAMUSCULAR | Status: DC | PRN
  Administered 2020-01-19: 2 mg via INTRAVENOUS

## 2020-01-18 MED ORDER — IOHEXOL 300 MG/ML  SOLN
100.0000 mL | Freq: Once | INTRAMUSCULAR | Status: AC | PRN
  Administered 2020-01-18: 100 mL via INTRAVENOUS

## 2020-01-18 MED ORDER — LEVETIRACETAM IN NACL 1000 MG/100ML IV SOLN
1000.0000 mg | Freq: Two times a day (BID) | INTRAVENOUS | Status: DC
  Administered 2020-01-18 – 2020-01-24 (×12): 1000 mg via INTRAVENOUS
  Filled 2020-01-18 (×13): qty 100

## 2020-01-18 MED ORDER — IOHEXOL 9 MG/ML PO SOLN
500.0000 mL | ORAL | Status: AC
  Administered 2020-01-18 (×2): 500 mL via ORAL

## 2020-01-18 MED ORDER — IMMUNE GLOBULIN (HUMAN) 10 GM/100ML IV SOLN
400.0000 mg/kg | INTRAVENOUS | Status: AC
  Administered 2020-01-18 – 2020-01-22 (×5): 30 g via INTRAVENOUS
  Filled 2020-01-18 (×2): qty 200
  Filled 2020-01-18 (×2): qty 300
  Filled 2020-01-18: qty 200
  Filled 2020-01-18: qty 300

## 2020-01-18 MED ORDER — LORAZEPAM 2 MG/ML IJ SOLN
2.0000 mg | INTRAMUSCULAR | Status: DC | PRN
  Administered 2020-01-20 – 2020-02-16 (×9): 2 mg via INTRAVENOUS
  Filled 2020-01-18 (×11): qty 1

## 2020-01-18 NOTE — Progress Notes (Signed)
Patient tolerating IVIG well, vitals taken , patient in no distress

## 2020-01-18 NOTE — Progress Notes (Signed)
Physical Therapy Treatment [name] MRN: [id number] DOB: Dec 13, 1985 Today's Date: 01/18/2020    History of Present Illness 34 year old with past medical history significant for hepatitis C, polysubstance abuse brought to the hospital 9/13 for disorientation which was suspected to be substance abuse.  After she became more lucid, she was discharged, but then police brought her back later in the day and they found her passed out in a parking lot.  Urine drug screen was positive for benzodiazepine and THC. An EEG done on 9/15 showed patient was in status epilepticus.  Patient was a started on Keppra.  Despite these, EEG noted continued seizures requiring additional medications as well.  Finally on 918, seizures broke. Follow-up EEG on 9/20 noted evidence of epileptic [name] from the left central temporal region.  Repeat EEG done on 9/22 noted epileptogenic city from the left central temporal region.    PT Comments    PT continues to not follow commands or initiate mobility with sessions, however is restless in bed moving her head and lifting trunk partially of back of bed. Did assist at times with ex's today, however resisted attempts to sit at edge of bed. Pt left with mittens on and needs in reach. Acute PT to continue.    Follow Up Recommendations  SNF;Supervision/Assistance - 24 hour     Equipment Recommendations  Wheelchair (measurements PT);Wheelchair cushion (measurements PT);Hospital bed (mechanical lift, all if home today)    Precautions / Restrictions Precautions Precautions: Fall Precaution Comments: cortrak, seizure history Restrictions Weight Bearing Restrictions: No    Mobility  Bed Mobility               General bed mobility comments: attempted to move pt from supine to edge of bed with pt resisting movement, not safe to mobilize with one person assist      Cognition Arousal/Alertness: Awake/alert Behavior During Therapy:  Restless Overall Cognitive Status: No family/caregiver present to determine baseline cognitive functioning         General Comments: pt is aphasic, does not follow commands. Pt appears restless in bed. Pt follows through with some mobility tasks once PT initiates with tactile cues and physical assistance until trying to move edge of bed, pt resistant making mobilty not safe.      Exercises General Exercises - Lower Extremity Ankle Circles/Pumps: AAROM;PROM;Strengthening;Both;10 reps;Supine;Limitations Ankle Circles/Pumps Limitations: pt intermittently assisting with ex's Heel Slides: AAROM;PROM;Strengthening;Both;10 reps;Supine;Limitations Heel Slides Limitations: pt intermittently assisted with hip/knee extension Hip ABduction/ADduction: PROM;AAROM;Strengthening;Both;10 reps;Supine;Limitations Hip Abduction/Adduction Limitations: pt intermittently assisted Straight Leg Raises: AAROM;PROM;Strengthening;Both;10 reps;Supine;Limitations Straight Leg Raises Limitations: pt intermittently assisted     Pertinent Vitals/Pain Pain Assessment: Faces Faces Pain Scale: No hurt     PT Goals (current goals can now be found in the care plan section) Acute Rehab PT Goals Patient Stated Goal: pt unable to state, PT goal to follow ~50% of PT commands and improve participation in PT PT Goal Formulation: Patient unable to participate in goal setting Potential to Achieve Goals: Poor Progress towards PT goals: Not progressing toward goals - comment (continues to be limited by cognitive status, aphasia)    Frequency    Min 1X/week (trial to assess if pt can participate with PT)      PT Plan Current plan remains appropriate    AM-PAC PT "6 Clicks" Mobility   Outcome Measure  Help needed turning from your back to your side while in a flat bed without using bedrails?: Total Help needed moving from lying on  your back to sitting on the side of a flat bed without using bedrails?: Total Help  needed moving to and from a bed to a chair (including a wheelchair)?: Total Help needed standing up from a chair using your arms (e.g., wheelchair or bedside chair)?: Total Help needed to walk in hospital room?: Total Help needed climbing 3-5 steps with a railing? : Total 6 Click Score: 6    End of Session   Activity Tolerance: Other (comment) (limited due to cognitive deficits, aphasia)   Nurse Communication: Mobility status;Need for lift equipment PT Visit Diagnosis: Muscle weakness (generalized) (M62.81);Other symptoms and signs involving the nervous system (R29.898)     Time: 1130-1141 PT Time Calculation (min) (ACUTE ONLY): 11 min  Charges:  $Therapeutic Exercise: 8-22 mins                    Sallyanne Kuster, PTA, [facility] Acute Rehab Services Office- [phone] 01/18/20, 11:54 AM   Sallyanne Kuster 01/18/2020, 11:52 AM

## 2020-01-18 NOTE — Progress Notes (Signed)
IVIG started at 1520, patient tolerating transfusion well, vitals taken.

## 2020-01-18 NOTE — Evaluation (Signed)
Clinical/Bedside Swallow Evaluation Patient Details  Name: Anita Davis MRN: [id number] Date of Birth: [birth year]  Today's Date: 01/18/2020 Time: SLP Start Time (ACUTE ONLY): 0847 SLP Stop Time (ACUTE ONLY): 0907 SLP Time Calculation (min) (ACUTE ONLY): 20 min  Past Medical History:  Past Medical History:  Diagnosis Date  . Allergy   . Bronchitis   . Hepatitis-C   . History of gestational diabetes   . HPV in female   . Substance abuse [facility])    Past Surgical History:  Past Surgical History:  Procedure Laterality Date  . HEMORRHOID BANDING  age 62   HPI:  Pt is a 34 year old woman who presented to [facility] 01/04/2020 with suspected methamphetamine use-discharged and brought back the same day with confusion and agitation. Toxicology consistent only with benzodiazepine and THC and not on any stimulants. 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 Per chart, mentation has been fluctuating. PMH includes hepatitis C and polysubstance abuse.   Assessment / Plan / Recommendation Clinical Impression  Pt is alert but not verbalizing or following commands well this morning. Per chart, her mentation has been fluctuating. She has been on a diet of regular solids and thin liquids but RN reports minimal intake (few bites at most during meals) and OT noted signs of dysphagia at breakfast today, with pt not chewing her food and not able to suck via straw.   During eval, pt has no overt s/s of aspiration but she does present with signs of dysphagia that appears to be impacted by her mentation. She has trouble with initiation and reduced bolus awareness. Max cues were provided to try to suck via straw or drink from the edge of the cup. She sipped through the straw x1, otherwise needing liquids siphoned. Oral clearance is slow, and suspect she could be at risk for delayed swallow trigger vs premature spillage given length of time between presentation and hyolaryngeal movement.  There are minimal attempts at mastication despite multimodal cues, and her attempts are ineffective with the bolus still sitting on her tongue, largely not chewed. Recommend downgrading diet to Dys 1 (puree), still with thin liquids. Suspect she will continue to have trouble meeting nutritional needs at this time, so recommend continued use of Cortrak.   SLP Visit Diagnosis: Dysphagia, unspecified (R13.10)    Aspiration Risk  Moderate aspiration risk;Risk for inadequate nutrition/hydration    Diet Recommendation Dysphagia 1 (Puree);Thin liquid   Liquid Administration via: Spoon;Cup;Straw Medication Administration: Via alternative means Supervision: Staff to assist with self feeding;Full supervision/cueing for compensatory strategies Compensations: Minimize environmental distractions;Slow rate;Small sips/bites Postural Changes: Seated upright at 90 degrees    Other  Recommendations Oral Care Recommendations: Oral care QID Other Recommendations: Have oral suction available   Follow up Recommendations Skilled Nursing facility      Frequency and Duration min 2x/week  2 weeks       Prognosis Prognosis for Safe Diet Advancement: Good Barriers to Reach Goals: Cognitive deficits      Swallow Study   General HPI: Pt is a 34 year old woman who presented to [facility] 01/04/2020 with suspected methamphetamine use-discharged and brought back the same day with confusion and agitation. Toxicology consistent only with benzodiazepine and THC and not on any stimulants. 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 Per chart, mentation has been fluctuating. PMH includes hepatitis C and polysubstance abuse. Type of Study: Bedside Swallow Evaluation Previous Swallow Assessment: none in chart Diet Prior to this Study: Regular;Thin liquids Temperature Spikes Noted:  No Respiratory Status: Room air History of Recent Intubation: No Behavior/Cognition: Alert;Requires cueing Oral  Cavity Assessment: Dry Oral Care Completed by SLP: No Oral Cavity - Dentition: Adequate natural dentition;Poor condition Self-Feeding Abilities: Total assist Patient Positioning: Upright in bed Baseline Vocal Quality: Not observed    Oral/Motor/Sensory Function Overall Oral Motor/Sensory Function:  (not following commands for direct assessment)   Ice Chips Ice chips: Impaired Presentation: Spoon Oral Phase Impairments: Poor awareness of bolus;Impaired mastication Oral Phase Functional Implications: Oral holding   Thin Liquid Thin Liquid: Impaired Presentation: Straw;Spoon Oral Phase Impairments: Poor awareness of bolus Pharyngeal  Phase Impairments: Suspected delayed Swallow    Nectar Thick Nectar Thick Liquid: Not tested   Honey Thick Honey Thick Liquid: Not tested   Puree Puree: Impaired Presentation: Spoon Oral Phase Impairments: Reduced lingual movement/coordination;Poor awareness of bolus Oral Phase Functional Implications: Prolonged oral transit   Solid     Solid: Impaired Presentation: Spoon Oral Phase Impairments: Impaired mastication;Poor awareness of bolus Oral Phase Functional Implications: Oral residue      Mahala Menghini., M.A. CCC-SLP Acute Rehabilitation Services Pager [phone] Office [phone]  01/18/2020,9:30 AM

## 2020-01-18 NOTE — Progress Notes (Addendum)
Subjective: No clinical seizure-like episodes.  However patient continues to be nonverbal, requiring mittens.   ROS: Unable to obtain due to poor mental status  Examination  Vital signs in last 24 hours: Temp:  [97.6 F (36.4 C)-99.6 F (37.6 C)] 99.4 F (37.4 C) (09/27 1149) Pulse Rate:  [82-99] 99 (09/27 1149) Resp:  [16-18] 16 (09/27 0331) BP: (116-126)/(72-99) 126/76 (09/27 1149) SpO2:  [90 %-99 %] 98 % (09/27 1149)  General: lying in bed, not in apparent distress CVS: pulse-normal rate and rhythm RS: breathing comfortably, CTA B Extremities: normal, warm  Neuro: Awake, alert, looked at the examiner when I walked in the room and follow 1 simple command (sticking out her tongue), however was nonverbal and did not follow any other commands.  Continue to sit in the bed with downward gaze and not interacting with examiner.  Was noted to spontaneously move all 4 extremities with antigravity strength in the bed.  Appears to have normal tone and 2+ reflexes in all 4 extremities  Basic Metabolic Panel: Recent Labs  Lab 01/13/20 0601 01/13/20 0601 01/15/20 0253 01/15/20 0253 01/15/20 1804 01/16/20 0601 01/16/20 1656 01/17/20 0503 01/18/20 0318  NA 141  --  139  --   --  139  --  139 140  K 3.3*  --  3.3*  --   --  3.2*  --  3.8 4.2  CL 107  --  104  --   --  104  --  102 105  CO2 24  --  23  --   --  26  --  26 27  GLUCOSE 125*  --  103*  --   --  189*  --  146* 178*  BUN 31*  --  28*  --   --  13  --  12 9  CREATININE 0.70  --  0.71  --   --  0.65  --  0.67 0.72  CALCIUM 8.9   < > 8.4*   < >  --  8.7*  --  8.7* 8.9  MG  --   --  2.2  --  2.3 2.0 2.2  --   --   PHOS  --   --   --   --  2.9 3.3 3.3  --   --    < > = values in this interval not displayed.    CBC: Recent Labs  Lab 01/13/20 0601 01/15/20 0253 01/16/20 0601 01/17/20 0503 01/18/20 0318  WBC 3.2* 4.9 2.7* 4.1 3.7*  NEUTROABS 1.2*  --   --   --   --   HGB 14.3 14.6 13.8 14.2 13.1  HCT 41.0 42.6 39.4  41.0 38.8  MCV 89.3 88.2 87.0 88.9 89.4  PLT 211 179 151 169 120*     Coagulation Studies: No results for input(s): LABPROT, INR in the last 72 hours.  Imaging MRI brain without contrast at 01/16/2020: Motion degraded study with no acute intracranial process.   ASSESSMENT AND PLAN: 34 year old female with history of polysubstance abuse (methamphetamine, THC) who initially presented to [facility] on 01/06/2020 with encephalopathy.  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  Keppra was started and eventually Vimpat was added with improvement EEG but patient continued to be altered and therefore transferred to [facility].  On arrival, EEG showed sharp waves in left central temporal region as well as lateralized rhythmic delta activity in left hemisphere which improved after  adding phenytoin and therefore was most likely ictal in nature.  Since then phenobarbital also been added with further improvement in EEG (no more sharp waves).  Of note, patient underwent lumbar puncture at [facility] on 01/05/2020 which did not show any pleocytosis and had normal protein and glucose. 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  During this whole time, patient has had fluctuating mental status, at 1 point she was able to follow commands and say simple words but has since had worsening again and is currently nonverbal and following simple commands intermittently.   Encephalopathy Nonconvulsive status epilepticus (resolved) Polysubstance abuse Hyperglycemia Hypoproteinemia with hypoalbuminemia Transaminitis Thrombocytopenia Leukopenia -Patient continues to be nonverbal, follows 1 simple command (sticking out her tongue) but did not follow any command subsequently. -At this point, differentials include autoimmune/paraneoplastic encephalitis, prolonged encephalopathy secondary to substance abuse,  psychiatric etiology  Recommendations -We will order MRI brain with contrast, CT chest abdomen pelvis with contrast as part of paraneoplastic work-up -We will order IgA levels, and plan to start empiric IVIG for 5 days -Reduce Keppra to 1000 mg twice daily as this can worsen psychiatric comorbidities -Continue lacosamide 200 mg twice daily, phenytoin 100 every 8 hours and phenobarb 65 mg twice daily -Continue seizure precautions -As needed IV Ativan 2 mg for clinical seizure-like activity -Management of rest of comorbidities per primary team  I have spent a total of  40  minutes with the patient reviewing hospital notes,  test results, labs and examining the patient as well as establishing an assessment and plan that was discussed personally with the Dr Sunnie Nielsen.  > 50% of time was spent in direct patient care.      Lindie Spruce Epilepsy [facility] For questions after 5pm please refer to AMION to reach the Neurologist on call

## 2020-01-18 NOTE — Progress Notes (Signed)
PROGRESS NOTE    Anita Davis  VFI:[id number] DOB: 1985/10/11 DOA: 01/04/2020 PCP: Soyla Dryer, PA-C   Brief Narrative: 34 year old with past medical history significant for hepatitis C, polysubstance abuse brought to the hospital 9/13 for disorientation which was suspected to be substance abuse.  After she became more lucid, she was discharged, but then police brought her back later in the day and they found her passed out in a parking lot.  Urine drug screen was positive for benzodiazepine and THC, negative for methamphetamine, which patient had reportedly been taking in the past according to family.  Work-up in the emergency room otherwise unrevealing.  Patient required several doses of IV Ativan to control her agitation.  Because of her persistent altered confusion, neurology was consulted and patient was admitted to the hospitalist service.  She underwent lumbar puncture which was unrevealing.  An EEG done on 9/15 showed patient was in status epilepticus.  Patient was a started on Keppra.  Despite these, EEG noted continued seizures requiring additional medications as well.  Finally on 918, seizures broke using Ativan and fosphenytoin plus Keppra and Vimpat and Solu-Medrol.  Neurology feels that patient has likely been seizing from [place] steroid course on 9/21.  Patient herself from mentation standpoint waxes and wane.  Barely verbalize.,  Stares episodes.  -Follow-up EEG on 9/20 noted evidence of epileptic [name] from the left central temporal region.  Repeat EEG done on 9/22 noted epileptogenic city from the left central temporal region.   -Neurology continue to follow and adjust medications.     Assessment & Plan:   Principal Problem:   Refractory seizure ([place]) Active Problems:   Acute metabolic encephalopathy   Hypokalemia   Polysubstance abuse (HCC)   Elevated CK   Transaminitis   Chronic hepatitis C without hepatic coma (HCC)   1-Refractory seizures with acute metabolic  encephalopathy: -Patient was  long-term monitoring during this admission. - -Patient non verbal.  -On Vimpat, Keppra, phenobarbital and phenytoin. -She also underwent LP during this hospitalization, unrevealing. -She was noted to have elevated ammonia level for which she was a started on lactulose. -Ammonia level ordered for today.  At 35. -ANA negative, ESR, CRP negative.  TSH normal, RPR nonreactive. -HSV spinal fluid negative.  -EEG 9/25 whiting normal limits. Plan to discontinue EEG.  -Continue with current med.  -MRI negative for stroke.  -Might need psych consult as her mentation improved.  -Alert, non verbal.  -plan for IVIG, CT chest abdomen, pelvis contrast to look for malignancy, MRI with contrast.   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 She has never follow-up with any outpatient providers. Right upper quadrant ultrasound; mild increase liver echogenicity may represent degree of underlying hepatic steatosis.  No focal lesion evident.  No gallbladder pathology evident. LFT, trending down. Monitor on current anti seizure medications.    3-Hypokalemia: Resolved.  Magnesium normal at 2.2.  4-Nutrition: Patient with very poor oral intake.  Plan to continue with IV fluids.   Started on tube feeding 9/24. Monitor phosphorus, mg K level.  Added thiamine 500 mg IV for 3 days then 100 mg daily.    History of polysubstance abuse: Need counseling once patient is more awake and oriented Visitor restriction, need past were made by aunt, concerned about patient's boyfriend and behavior.  See notes documented by Dr. [place] Pink on 9/23  Hyperglycemia: Secondary to a steroid resolved  Leukopenia; resolved.     Estimated body mass index is 27.26 kg/m as calculated from  the following:   Height as of this encounter: 5' 5"  (1.651 m).   Weight as of this encounter: 74.3 kg.   DVT prophylaxis: Lovenox Code Status: Full code Family Communication:  Aunt updated.  Disposition Plan:  Status is: Inpatient  Remains inpatient appropriate because:IV treatments appropriate due to intensity of illness or inability to take PO   Dispo: The patient is from: Home              Anticipated d/c is to: to be determine               Anticipated d/c date is: 3 days              Patient currently is not medically stable to d/c.        Consultants:   Neurology   Procedures:   EEG  Antimicrobials:    Subjective: Continue to be non verbal.   Objective: Vitals:   01/18/20 0331 01/18/20 0826 01/18/20 1149 01/18/20 1512  BP: (!) 118/99 125/87 126/76 (!) 116/95  Pulse: 98 96 99 (!) 104  Resp: 16   20  Temp: 98.4 F (36.9 C) 98.2 F (36.8 C) 99.4 F (37.4 C) 99.7 F (37.6 C)  TempSrc: Oral Oral Axillary Axillary  SpO2: 97% 99% 98%   Weight:      Height:        Intake/Output Summary (Last 24 hours) at 01/18/2020 1529 Last data filed at 01/18/2020 0831 Gross per 24 hour  Intake 2851.4 ml  Output 1700 ml  Net 1151.4 ml   Filed Weights   01/13/20 1615 01/16/20 0447 01/17/20 0350  Weight: 72.3 kg 75 kg 74.3 kg    Examination:  General exam: NAD Respiratory system: CTA Cardiovascular system: S 1, S 2 RRR Gastrointestinal system: BS present, soft, nt Central nervous system: Alert, non verbal, not following command.  Extremities:no edema   Data Reviewed: I have personally reviewed following labs and imaging studies  CBC: Recent Labs  Lab 01/13/20 0601 01/15/20 0253 01/16/20 0601 01/17/20 0503 01/18/20 0318  WBC 3.2* 4.9 2.7* 4.1 3.7*  NEUTROABS 1.2*  --   --   --   --   HGB 14.3 14.6 13.8 14.2 13.1  HCT 41.0 42.6 39.4 41.0 38.8  MCV 89.3 88.2 87.0 88.9 89.4  PLT 211 179 151 169 629*   Basic Metabolic Panel: Recent Labs  Lab 01/13/20 0601 01/15/20 0253 01/15/20 1804 01/16/20 0601 01/16/20 1656 01/17/20 0503 01/18/20 0318  NA 141 139  --  139  --  139 140  K 3.3* 3.3*  --  3.2*  --  3.8 4.2  CL 107  104  --  104  --  102 105  CO2 24 23  --  26  --  26 27  GLUCOSE 125* 103*  --  189*  --  146* 178*  BUN 31* 28*  --  13  --  12 9  CREATININE 0.70 0.71  --  0.65  --  0.67 0.72  CALCIUM 8.9 8.4*  --  8.7*  --  8.7* 8.9  MG  --  2.2 2.3 2.0 2.2  --   --   PHOS  --   --  2.9 3.3 3.3  --   --    GFR: Estimated Creatinine Clearance: 98.1 mL/min (by C-G formula based on SCr of 0.72 mg/dL). Liver Function Tests: Recent Labs  Lab 01/13/20 0601 01/15/20 0253 01/16/20 0601 01/17/20 0503 01/18/20 0318  AST 459* 310*  277* 135* 117*  ALT 739* 641* 629* 472* 350*  ALKPHOS 41 45 66 74 67  BILITOT 1.2 1.1 0.8 0.7 0.8  PROT 6.7 6.3* 5.8* 5.9* 5.6*  ALBUMIN 3.6 3.5 3.2* 3.1* 3.0*   No results for input(s): LIPASE, AMYLASE in the last 168 hours. Recent Labs  Lab 01/13/20 0601 01/15/20 0253 01/15/20 1804  AMMONIA 42* 35 32   Coagulation Profile: No results for input(s): INR, PROTIME in the last 168 hours. Cardiac Enzymes: Recent Labs  Lab 01/13/20 1034  CKTOTAL 145   BNP (last 3 results) No results for input(s): PROBNP in the last 8760 hours. HbA1C: No results for input(s): HGBA1C in the last 72 hours. CBG: Recent Labs  Lab 01/17/20 2055 01/18/20 0011 01/18/20 0334 01/18/20 0829 01/18/20 1152  GLUCAP 129* 156* 151* 180* 180*   Lipid Profile: No results for input(s): CHOL, HDL, LDLCALC, TRIG, CHOLHDL, LDLDIRECT in the last 72 hours. Thyroid Function Tests: No results for input(s): TSH, T4TOTAL, FREET4, T3FREE, THYROIDAB in the last 72 hours. Anemia Panel: No results for input(s): VITAMINB12, FOLATE, FERRITIN, TIBC, IRON, RETICCTPCT in the last 72 hours. Sepsis Labs: No results for input(s): PROCALCITON, LATICACIDVEN in the last 168 hours.  No results found for this or any previous visit (from the past 240 hour(s)).       Radiology Studies: MR BRAIN WO CONTRAST  Result Date: 01/16/2020 CLINICAL DATA:  Seizure, altered mental status. EXAM: MRI HEAD WITHOUT  CONTRAST TECHNIQUE: Multiplanar, multiecho pulse sequences of the brain and surrounding structures were obtained without intravenous contrast. COMPARISON:  01/10/2020 head CT.  01/05/2020 MRI head. FINDINGS: Please note that motion artifact limits evaluation. Brain: No diffusion-weighted signal abnormality. No intracranial hemorrhage. Cerebral volume is within normal limits. No midline shift, ventriculomegaly or extra-axial fluid collection. No mass lesion. Vascular: Grossly preserved major intracranial flow voids. Skull and upper cervical spine: Normal marrow signal. Sinuses/Orbits: Normal orbits. Clear paranasal sinuses. No mastoid effusion. Other: None. IMPRESSION: No acute intracranial process. Motion degradation limits evaluation. Electronically Signed   By: Primitivo Gauze M.D.   On: 01/16/2020 21:36        Scheduled Meds: . Chlorhexidine Gluconate Cloth  6 each Topical Daily  . enoxaparin (LOVENOX) injection  40 mg Subcutaneous Q24H  . feeding supplement (PROSource TF)  45 mL Per Tube Daily  . influenza vac split quadrivalent PF  0.5 mL Intramuscular Tomorrow-1000  . insulin aspart  0-9 Units Subcutaneous TID WC  . iohexol  500 mL Oral Q1H  . lactulose  20 g Oral Daily  . mouth rinse  15 mL Mouth Rinse BID  . PHENObarbital  65 mg Intravenous BID  . phenytoin (DILANTIN) IV  100 mg Intravenous Q8H  . pyridOXINE  100 mg Intravenous Daily  . [START ON 01/20/2020] thiamine injection  100 mg Intravenous Daily   Continuous Infusions: . dextrose 5% lactated ringers 75 mL/hr at 01/17/20 1823  . feeding supplement (OSMOLITE 1.2 CAL) 1,000 mL (01/15/20 1809)  . Immune Globulin 10%    . lacosamide (VIMPAT) IV 200 mg (01/18/20 1005)  . levETIRAcetam    . thiamine injection 500 mg (01/18/20 0843)     LOS: 12 days    Time spent: 35 minutes    Rishik Tubby A Tyshae Stair, MD [facility]   If 7PM-7AM, please contact night-coverage [email or web address]  01/18/2020, 3:29 PM

## 2020-01-18 NOTE — Progress Notes (Signed)
IVIG complete, post vitals taken

## 2020-01-18 NOTE — Evaluation (Signed)
Occupational Therapy Evaluation [name] MRN: [id number] DOB: November 26, 1985 Today's Date: 01/18/2020    History of Present Illness 34 year old with past medical history significant for hepatitis C, polysubstance abuse brought to the hospital 9/13 for disorientation which was suspected to be substance abuse.  After she became more lucid, she was discharged, but then police brought her back later in the day and they found her passed out in a parking lot.  Urine drug screen was positive for benzodiazepine and THC. An EEG done on 9/15 showed patient was in status epilepticus.  Patient was a started on Keppra.  Despite these, EEG noted continued seizures requiring additional medications as well.  Finally on 918, seizures broke. Follow-up EEG on 9/20 noted evidence of epileptic [name] from the left central temporal region.  Repeat EEG done on 9/22 noted epileptogenic city from the left central temporal region.   Clinical Impression   PT admitted with seizures. Pt currently with functional limitiations due to the deficits listed below (see OT problem list). Pt currently does not follow simple commands( 0% of session). Pt does not response to name call and keeps a very fixed gaze during session. Pt presented with food does automatically open mouth partially with decreased motor initiation noted. SLP consult requested due to feeding deficits noted during session.  Pt will benefit from skilled OT to increase their independence and safety with adls and balance to allow discharge SNF (pending progress).     Follow Up Recommendations  SNF    Equipment Recommendations  3 in 1 bedside commode;Wheelchair (measurements OT);Wheelchair cushion (measurements OT);Hospital bed (hoyer)    Recommendations for Other Services Speech consult     Precautions / Restrictions Precautions Precautions: Fall Precaution Comments: seizure, cortrak Restrictions Weight Bearing Restrictions: No       Mobility Bed Mobility Overal bed mobility: Needs Assistance   Rolling: Total assist         General bed mobility comments: total (A) to slide up in bed with legs in flexion and does not attempt to sustain knee flexion to (A) with bed mobility. pt with not response to movement up in the bed  Transfers                 General transfer comment: chair position of bed at this time to attempt self feeding task and will require two person (A) for eob and OOB attempts. Recommend chair position of bed for safety with RN / tech staff    Balance                                           ADL either performed or assessed with clinical judgement   ADL Overall ADL's : Needs assistance/impaired Eating/Feeding: Total assistance Eating/Feeding Details (indicate cue type and reason): attempting self feeding with immediate deficits noted. Pt not chewing food offered. pt does not show ability to obtain fluid from straw. MD notified and SLP order requested. Tech and RN notified to keep food from patient at this time until SLP can assess further Grooming: Total assistance   Upper Body Bathing: Total assistance   Lower Body Bathing: Total assistance   Upper Body Dressing : Total assistance   Lower Body Dressing: Total assistance                 General ADL Comments: pt does not attempt to respond to  OT sliding pt up in bed. pt with no startle response to movement even      Vision   Additional Comments: does not track therapist. will need to be further assessed. pt does show use of general vision when presented with food      Perception     Praxis      Pertinent Vitals/Pain Pain Assessment: No/denies pain Faces Pain Scale: No hurt     Hand Dominance  (unknown )   Extremity/Trunk Assessment Upper Extremity Assessment Upper Extremity Assessment: RUE deficits/detail;LUE deficits/detail RUE Deficits / Details: generalize weakness pt with hand over hand to  initiate holding fork but unable to sustain. pt noted to move spontaneously on her own LUE Deficits / Details: edema noted throughout forearm. RN called to address IV to ensure IV not source of edema. Arm elevated on pillows   Lower Extremity Assessment Lower Extremity Assessment: Defer to PT evaluation   Cervical / Trunk Assessment Cervical / Trunk Assessment: Normal   Communication Communication Communication: Expressive difficulties;Receptive difficulties   Cognition Arousal/Alertness: Awake/alert Behavior During Therapy: Restless Overall Cognitive Status: No family/caregiver present to determine baseline cognitive functioning                                 General Comments: does not follow any commands. pt does automatically open mouth to fork but does not initiate chew or swallow. SLP notified immediately. pt does not respond to name call.    General Comments       Exercises General Exercises - Lower Extremity Ankle Circles/Pumps: AAROM;PROM;Strengthening;Both;10 reps;Supine;Limitations Ankle Circles/Pumps Limitations: pt intermittently assisting with ex's Heel Slides: AAROM;PROM;Strengthening;Both;10 reps;Supine;Limitations Heel Slides Limitations: pt intermittently assisted with hip/knee extension Hip ABduction/ADduction: PROM;AAROM;Strengthening;Both;10 reps;Supine;Limitations Hip Abduction/Adduction Limitations: pt intermittently assisted Straight Leg Raises: AAROM;PROM;Strengthening;Both;10 reps;Supine;Limitations Straight Leg Raises Limitations: pt intermittently assisted   Shoulder Instructions      Home Living Family/patient expects to be discharged to:: Unsure                                        Prior Functioning/Environment          Comments: unknown but was able to exit hospital recently so mobile at that time        OT Problem List: Decreased activity tolerance;Decreased cognition;Decreased coordination;Decreased  knowledge of precautions;Decreased knowledge of use of DME or AE;Decreased safety awareness;Impaired balance (sitting and/or standing)      OT Treatment/Interventions: Self-care/ADL training;Therapeutic activities;Therapeutic exercise;DME and/or AE instruction;Cognitive remediation/compensation;Patient/family education;Balance training    OT Goals(Current goals can be found in the care plan section) Acute Rehab OT Goals Patient Stated Goal: none stated and not responses made for any needs OT Goal Formulation: Patient unable to participate in goal setting Time For Goal Achievement: 02/01/20 Potential to Achieve Goals: Good  OT Frequency: Min 2X/week   Barriers to D/C:            Co-evaluation              AM-PAC OT "6 Clicks" Daily Activity     Outcome Measure Help from another person eating meals?: Total Help from another person taking care of personal grooming?: Total Help from another person toileting, which includes using toliet, bedpan, or urinal?: Total Help from another person bathing (including washing, rinsing, drying)?: Total Help from another person to put on and  taking off regular upper body clothing?: Total Help from another person to put on and taking off regular lower body clothing?: Total 6 Click Score: 6   End of Session Nurse Communication: Mobility status;Precautions  Activity Tolerance: Patient tolerated treatment well Patient left: in bed;with call bell/phone within reach;with bed alarm set  OT Visit Diagnosis: Unsteadiness on feet (R26.81);Cognitive communication deficit (R41.841)                Time: 3785-8850 OT Time Calculation (min): 22 min Charges:  OT General Charges $OT Visit: 1 Visit OT Evaluation $OT Eval Moderate Complexity: 1 Mod   Brynn, OTR/L  Acute Rehabilitation Services Pager: [phone] Office: [phone] .   Mateo Flow 01/18/2020, 3:41 PM

## 2020-01-19 ENCOUNTER — Inpatient Hospital Stay (HOSPITAL_COMMUNITY): Payer: Medicaid Other

## 2020-01-19 ENCOUNTER — Inpatient Hospital Stay: Payer: Self-pay

## 2020-01-19 LAB — GLUCOSE, CAPILLARY
Glucose-Capillary: 133 mg/dL — ABNORMAL HIGH (ref 70–99)
Glucose-Capillary: 170 mg/dL — ABNORMAL HIGH (ref 70–99)
Glucose-Capillary: 180 mg/dL — ABNORMAL HIGH (ref 70–99)
Glucose-Capillary: 187 mg/dL — ABNORMAL HIGH (ref 70–99)
Glucose-Capillary: 198 mg/dL — ABNORMAL HIGH (ref 70–99)
Glucose-Capillary: 212 mg/dL — ABNORMAL HIGH (ref 70–99)
Glucose-Capillary: 215 mg/dL — ABNORMAL HIGH (ref 70–99)

## 2020-01-19 LAB — HSV DNA BY PCR (REFERENCE LAB)

## 2020-01-19 LAB — HEMOGLOBIN A1C
Hgb A1c MFr Bld: 5.9 % — ABNORMAL HIGH (ref 4.8–5.6)
Mean Plasma Glucose: 122.63 mg/dL

## 2020-01-19 LAB — COMPREHENSIVE METABOLIC PANEL
ALT: 326 U/L — ABNORMAL HIGH (ref 0–44)
AST: 126 U/L — ABNORMAL HIGH (ref 15–41)
Albumin: 3 g/dL — ABNORMAL LOW (ref 3.5–5.0)
Alkaline Phosphatase: 81 U/L (ref 38–126)
Anion gap: 9 (ref 5–15)
BUN: 11 mg/dL (ref 6–20)
CO2: 26 mmol/L (ref 22–32)
Calcium: 9 mg/dL (ref 8.9–10.3)
Chloride: 102 mmol/L (ref 98–111)
Creatinine, Ser: 0.71 mg/dL (ref 0.44–1.00)
GFR calc Af Amer: >60 mL/min (ref >60)
GFR calc non Af Amer: >60 mL/min (ref >60)
Glucose, Bld: 220 mg/dL — ABNORMAL HIGH (ref 70–99)
Potassium: 3.8 mmol/L (ref 3.5–5.1)
Sodium: 137 mmol/L (ref 135–145)
Total Bilirubin: 0.7 mg/dL (ref 0.3–1.2)
Total Protein: 6.1 g/dL — ABNORMAL LOW (ref 6.5–8.1)

## 2020-01-19 LAB — CBC
HCT: 37.9 % (ref 36.0–46.0)
Hemoglobin: 12.6 g/dL (ref 12.0–15.0)
MCH: 29.9 pg (ref 26.0–34.0)
MCHC: 33.2 g/dL (ref 30.0–36.0)
MCV: 90 fL (ref 80.0–100.0)
Platelets: 149 10*3/uL — ABNORMAL LOW (ref 150–400)
RBC: 4.21 MIL/uL (ref 3.87–5.11)
RDW: 11.9 % (ref 11.5–15.5)
WBC: 3.6 10*3/uL — ABNORMAL LOW (ref 4.0–10.5)
nRBC: 0 % (ref 0.0–0.2)

## 2020-01-19 MED ORDER — SODIUM CHLORIDE 0.9% FLUSH: 10.0000 mL | INTRAVENOUS | Status: DC | PRN

## 2020-01-19 MED ORDER — SODIUM CHLORIDE 0.9% FLUSH
10.0000 mL | Freq: Two times a day (BID) | INTRAVENOUS | Status: DC
  Administered 2020-01-19: 10 mL
  Administered 2020-01-20: 20 mL
  Administered 2020-01-20 – 2020-01-22 (×5): 10 mL
  Administered 2020-01-23: 20 mL
  Administered 2020-01-23 – 2020-01-24 (×3): 10 mL
  Administered 2020-01-25: 20 mL
  Administered 2020-01-25 – 2020-02-04 (×15): 10 mL

## 2020-01-19 MED ORDER — GADOBUTROL 1 MMOL/ML IV SOLN
7.5000 mL | Freq: Once | INTRAVENOUS | Status: AC | PRN
  Administered 2020-01-19: 7.5 mL via INTRAVENOUS

## 2020-01-19 NOTE — Progress Notes (Signed)
Subjective: No acute events overnight.  ROS: Unable to obtain due to poor mental status  Examination  Vital signs in last 24 hours: Temp:  [98 F (36.7 C)-99.7 F (37.6 C)] 98.2 F (36.8 C) (09/28 0807) Pulse Rate:  [89-108] 89 (09/28 0807) Resp:  [11-21] 14 (09/28 0807) BP: (104-133)/(71-100) 119/79 (09/28 0807) SpO2:  [95 %-100 %] 98 % (09/28 0807) Weight:  [76.1 kg] 76.1 kg (09/28 0358)   General: lying in bed, not in apparent distress CVS: pulse-normal rate and rhythm RS: breathing comfortably, CTA B Extremities: normal, warm  Neuro: Awake, alert, looked at the examiner when I walked in the room but did not follow any commands.  Was noted to spontaneously move all 4 extremities with antigravity strength in the bed.  Appears to have normal tone and 2+ reflexes in all 4 extremities  Basic Metabolic Panel: Recent Labs  Lab 01/15/20 0253 01/15/20 0253 01/15/20 1804 01/16/20 0601 01/16/20 0601 01/16/20 1656 01/17/20 0503 01/18/20 0318 01/19/20 0348  NA 139  --   --  139  --   --  139 140 137  K 3.3*  --   --  3.2*  --   --  3.8 4.2 3.8  CL 104  --   --  104  --   --  102 105 102  CO2 23  --   --  26  --   --  26 27 26   GLUCOSE 103*  --   --  189*  --   --  146* 178* 220*  BUN 28*  --   --  13  --   --  12 9 11   CREATININE 0.71  --   --  0.65  --   --  0.67 0.72 0.71  CALCIUM 8.4*   < >  --  8.7*   < >  --  8.7* 8.9 9.0  MG 2.2  --  2.3 2.0  --  2.2  --   --   --   PHOS  --   --  2.9 3.3  --  3.3  --   --   --    < > = values in this interval not displayed.    CBC: Recent Labs  Lab 01/13/20 0601 01/13/20 0601 01/15/20 0253 01/16/20 0601 01/17/20 0503 01/18/20 0318 01/19/20 0348  WBC 3.2*   < > 4.9 2.7* 4.1 3.7* 3.6*  NEUTROABS 1.2*  --   --   --   --   --   --   HGB 14.3   < > 14.6 13.8 14.2 13.1 12.6  HCT 41.0   < > 42.6 39.4 41.0 38.8 37.9  MCV 89.3   < > 88.2 87.0 88.9 89.4 90.0  PLT 211   < > 179 151 169 120* 149*   < > = values in this interval  not displayed.     Coagulation Studies: No results for input(s): LABPROT, INR in the last 72 hours.  Imaging CT chest abdomen pelvis with contrast 01/18/2020: No acute findings, no signs of neoplasm or metastatic disease.  Venous engorgement along the left parametrium which can be seen with pelvic congestion syndrome.  ASSESSMENT AND PLAN: 34 year old female with history of polysubstance abuse (methamphetamine, THC) who initially presented to [facility] on 01/06/2020 with encephalopathy.  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  Keppra was started and eventually Vimpat was added with improvement EEG but patient continued to  be altered and therefore transferred to [facility].  On arrival, EEG showed sharp waves in left central temporal region as well as lateralized rhythmic delta activity in left hemisphere which improved after adding phenytoin and therefore was most likely ictal in nature.  Since then phenobarbital also been added with further improvement in EEG (no more sharp waves).  Of note, patient underwent lumbar puncture at [facility] on 01/05/2020 which did not show any pleocytosis and had normal protein and glucose. 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  During this whole time, patient has had fluctuating mental status, at 1 point she was able to follow commands and say simple words but has since had worsening again and is currently nonverbal and following simple commands intermittently.   Encephalopathy Nonconvulsive status epilepticus (resolved) Polysubstance abuse Hyperglycemia Hypoproteinemia with hypoalbuminemia Transaminitis Thrombocytopenia Leukopenia -Patient continues to be nonverbal, follows 1 simple command (sticking out her tongue) but did not follow any command subsequently. -At this point, differentials include autoimmune/paraneoplastic encephalitis,  prolonged encephalopathy secondary to substance abuse, psychiatric etiology  Recommendations - MRI brain with contrast pending as part of paraneoplastic work-up -On empiric IVIG for D2/5  -Continue lacosamide Keppra 1000 mg twice daily, 200 mg twice daily, phenytoin 100 every 8 hours and phenobarb 65 mg twice daily -Continue seizure precautions -As needed IV Ativan 2 mg for clinical seizure-like activity -Management of rest of comorbidities per primary team  I have spent a total of 25  minuteswith the patient reviewing hospitalnotes,  test results, labs and examining the patient as well as establishing an assessment and plan.>50% of time was spent in direct patient care.    Lindie Spruce Epilepsy [facility] For questions after 5pm please refer to AMION to reach the Neurologist on call

## 2020-01-19 NOTE — Progress Notes (Signed)
PROGRESS NOTE    Anita Davis  ION:[id number] DOB: 09/06/1985 DOA: 01/04/2020 PCP: Soyla Dryer, PA-C   Brief Narrative: 34 year old with past medical history significant for hepatitis C, polysubstance abuse brought to the hospital 9/13 for disorientation which was suspected to be substance abuse.  After she became more lucid, she was discharged, but then police brought her back later in the day and they found her passed out in a parking lot.  Urine drug screen was positive for benzodiazepine and THC, negative for methamphetamine, which patient had reportedly been taking in the past according to family.  Work-up in the emergency room otherwise unrevealing.  Patient required several doses of IV Ativan to control her agitation.  Because of her persistent altered confusion, neurology was consulted and patient was admitted to the hospitalist service.  She underwent lumbar puncture which was unrevealing.  An EEG done on 9/15 showed patient was in status epilepticus.  Patient was a started on Keppra.  Despite these, EEG noted continued seizures requiring additional medications as well.  Finally on 9-18, seizures broke using Ativan and fosphenytoin plus Keppra and Vimpat and Solu-Medrol.  Neurology feels that patient has likely been seizing from [place] steroid course on 9/21.  Patient herself from mentation standpoint waxes and wane.  Barely verbalize.,  Stares episodes.  -Follow-up EEG on 9/20 noted evidence of epileptic [name] from the left central temporal region.  Repeat -EEG done on 9/22 noted epileptogenic city from the left central temporal region.   -Neurology continue to follow and adjust medications. -Differential include autoinmune/paraneoplastic encephalitis, prolonged encephalopathy secondary to substance abuse, psychiatric etiology.   Assessment & Plan:   Principal Problem:   Refractory seizure ([place]) Active Problems:   Acute metabolic encephalopathy   Hypokalemia   Polysubstance  abuse (HCC)   Elevated CK   Transaminitis   Chronic hepatitis C without hepatic coma (HCC)   1-Refractory seizures with acute metabolic encephalopathy: -Patient had  long-term monitoring during this admission. -she was in status epilepticus.  differential; seizure,autoimmune/ paraneoplastic encephalitis, prolonged encephalopathy secondary to substance abuse. -Patient continue to be non verbal.  -On Vimpat, Keppra, phenobarbital and phenytoin. -She also underwent LP during this hospitalization, unrevealing. -She was noted to have elevated ammonia level for which she was a started on lactulose. -Ammonia level normalized 35. -ANA negative, ESR, CRP negative.  TSH normal, RPR nonreactive. -HSV spinal fluid negative.  -EEG 9/25 whiting normal limits.  -MRI negative for stroke.  -Psych consultation. -Alert, non verbal.  -plan for IVIG 2/5. -CT chest abdomen, pelvis contrast to look for malignancy, negative. -MRI with contrast.  No acute abnormality, normal enhancement pattern.  2-Transaminases/history of hepatitis C: Hepatitis C was diagnosed in May. She has never follow-up with any outpatient providers. Right upper quadrant ultrasound; mild increase liver echogenicity may represent degree of underlying hepatic steatosis.  No focal lesion evident.  No gallbladder pathology evident. LFT, trending down. Monitor on current anti seizure medications.    3-Hypokalemia: Resolved.  Magnesium normal at 2.2.  4-Nutrition: Patient with very poor oral intake.  Plan to continue with IV fluids.   Started on tube feeding 9/24. Monitor phosphorus, mg K level.  Received thiamine 500 mg IV for 3 days then 100 mg daily.    History of polysubstance abuse: Need counseling once patient is more awake and oriented Visitor restriction, need past were made by aunt, concerned about patient's boyfriend and behavior.  See notes documented by Dr. [place] Pink on 9/23  Hyperglycemia: Secondary to a steroid  resolved  Leukopenia; resolved.     Estimated body mass index is 27.92 kg/m as calculated from the following:   Height as of this encounter: _0  (1.651 m).   Weight as of this encounter: 76.1 kg.   DVT prophylaxis: Lovenox Code Status: Full code Family Communication: Aunt updated.  Disposition Plan:  Status is: Inpatient  Remains inpatient appropriate because:IV treatments appropriate due to intensity of illness or inability to take PO   Dispo: The patient is from: Home              Anticipated d/c is to: to be determine               Anticipated d/c date is: 3 days              Patient currently is not medically stable to d/c.        Consultants:   Neurology   Procedures:   EEG  Antimicrobials:    Subjective: Non verbal. Moves extremities at times  Objective: Vitals:   01/18/20 2337 01/19/20 0358 01/19/20 0807 01/19/20 1127  BP: (!) 115/93 133/77 119/79 97/68  Pulse: 89 91 89 93  Resp: _1 Temp: 98 F (36.7 C) 98.4 F (36.9 C) 98.2 F (36.8 C) 98.4 F (36.9 C)  TempSrc: Axillary Oral Oral Axillary  SpO2: 96% 98% 98% 97%  Weight:  76.1 kg    Height:        Intake/Output Summary (Last 24 hours) at 01/19/2020 1309 Last data filed at 01/19/2020 6387 Gross per 24 hour  Intake --  Output 800 ml  Net -800 ml   Filed Weights   01/16/20 0447 01/17/20 0350 01/19/20 0358  Weight: 75 kg 74.3 kg 76.1 kg    Examination:  General exam: NAD Respiratory system: CTA Cardiovascular system: S 1, S 2 RRR Gastrointestinal system: BS present, soft, nt Central nervous system: Alert, nonverbal, not following commands Extremities: Trace edema   Data Reviewed: I have personally reviewed following labs and imaging studies  CBC: Recent Labs  Lab 01/13/20 0601 01/13/20 0601 01/15/20 0253 01/16/20 0601 01/17/20 0503 01/18/20 0318 01/19/20 0348  WBC 3.2*   < > 4.9 2.7* 4.1 3.7* 3.6*  NEUTROABS 1.2*  --   --   --   --   --   --   HGB 14.3    < > 14.6 13.8 14.2 13.1 12.6  HCT 41.0   < > 42.6 39.4 41.0 38.8 37.9  MCV 89.3   < > 88.2 87.0 88.9 89.4 90.0  PLT 211   < > 179 151 169 120* 149*   < > = values in this interval not displayed.   Basic Metabolic Panel: Recent Labs  Lab 01/15/20 0253 01/15/20 1804 01/16/20 0601 01/16/20 1656 01/17/20 0503 01/18/20 0318 01/19/20 0348  NA 139  --  139  --  139 140 137  K 3.3*  --  3.2*  --  3.8 4.2 3.8  CL 104  --  104  --  102 105 102  CO2 23  --  26  --  _2 GLUCOSE 103*  --  189*  --  146* 178* 220*  BUN 28*  --  13  --  _3 CREATININE 0.71  --  0.65  --  0.67 0.72 0.71  CALCIUM 8.4*  --  8.7*  --  8.7* 8.9 9.0  MG 2.2 2.3 2.0 2.2  --   --   --  PHOS  --  2.9 3.3 3.3  --   --   --    GFR: Estimated Creatinine Clearance: 99.1 mL/min (by C-G formula based on SCr of 0.71 mg/dL). Liver Function Tests: Recent Labs  Lab 01/15/20 0253 01/16/20 0601 01/17/20 0503 01/18/20 0318 01/19/20 0348  AST 310* 277* 135* 117* 126*  ALT 641* 629* 472* 350* 326*  ALKPHOS 45 66 74 67 81  BILITOT 1.1 0.8 0.7 0.8 0.7  PROT 6.3* 5.8* 5.9* 5.6* 6.1*  ALBUMIN 3.5 3.2* 3.1* 3.0* 3.0*   No results for input(s): LIPASE, AMYLASE in the last 168 hours. Recent Labs  Lab 01/13/20 0601 01/15/20 0253 01/15/20 1804  AMMONIA 42* 35 32   Coagulation Profile: No results for input(s): INR, PROTIME in the last 168 hours. Cardiac Enzymes: Recent Labs  Lab 01/13/20 1034  CKTOTAL 145   BNP (last 3 results) No results for input(s): PROBNP in the last 8760 hours. HbA1C: Recent Labs    01/19/20 0348  HGBA1C 5.9*   CBG: Recent Labs  Lab 01/18/20 1948 01/19/20 0019 01/19/20 0403 01/19/20 0805 01/19/20 1123  GLUCAP 155* 212* 215* 187* 133*   Lipid Profile: No results for input(s): CHOL, HDL, LDLCALC, TRIG, CHOLHDL, LDLDIRECT in the last 72 hours. Thyroid Function Tests: No results for input(s): TSH, T4TOTAL, FREET4, T3FREE, THYROIDAB in the last 72 hours. Anemia Panel: No  results for input(s): VITAMINB12, FOLATE, FERRITIN, TIBC, IRON, RETICCTPCT in the last 72 hours. Sepsis Labs: No results for input(s): PROCALCITON, LATICACIDVEN in the last 168 hours.  No results found for this or any previous visit (from the past 240 hour(s)).       Radiology Studies: CT CHEST W CONTRAST  Result Date: 01/18/2020 CLINICAL DATA:  Neoplastic syndrome suspected, encephalopathy status epilepticus EXAM: CT CHEST, ABDOMEN, AND PELVIS WITH CONTRAST TECHNIQUE: Multidetector CT imaging of the chest, abdomen and pelvis was performed following the standard protocol during bolus administration of intravenous contrast. CONTRAST:  174m OMNIPAQUE IOHEXOL 300 MG/ML  SOLN COMPARISON:  January 23, 2017 FINDINGS: CT CHEST FINDINGS Cardiovascular: Heart size is normal. Aortic caliber is normal. Central pulmonary vasculature is normal caliber, limited assessment on venous phase. Mediastinum/Nodes: Thoracic inlet structures without signs of adenopathy. Small lymph node at the LEFT thoracic inlet approximately 5 mm. No axillary lymphadenopathy. No mediastinal or hilar lymphadenopathy. Lungs/Pleura: Lungs are clear.  Airways are patent. Musculoskeletal: See below for full musculoskeletal detail. No chest wall lesion. CT ABDOMEN PELVIS FINDINGS Hepatobiliary: No focal, suspicious hepatic lesion. No portal venous abnormality. No pericholecystic stranding.  No gross biliary duct distension. Pancreas: Pancreas with normal contours. Normal enhancement and without signs of adjacent inflammation. Spleen: Spleen normal in size and contour. Adrenals/Urinary Tract: Adrenal glands are normal. Mild cortical scarring on the RIGHT. No hydronephrosis. No nephrolithiasis on contrasted imaging. Foley catheter collapses the urinary bladder. Small low-density lesions in the LEFT kidney stable since 2018 and likely small cysts Stomach/Bowel: Weighted tip tube in the gastric antrum. Stomach is collapsed limiting assessment without  acute process. Normal appendix. Small bowel without signs of obstruction. Stool throughout the colon.  No pericolonic stranding. Vascular/Lymphatic: Normal caliber abdominal aorta. No signs of vascular disease or atherosclerotic changes. There is no gastrohepatic or hepatoduodenal ligament lymphadenopathy. No retroperitoneal or mesenteric lymphadenopathy. No pelvic sidewall lymphadenopathy. Reproductive: Uterus and adnexa are unremarkable by CT aside from some venous engorgement along the LEFT parametrium. Other: Fat containing umbilical hernia. Musculoskeletal: No acute bone finding. No destructive bone process. IMPRESSION: 1. No acute findings in  the chest, abdomen or pelvis. 2. No signs of neoplasm by CT or metastatic disease. 3. venous engorgement along the LEFT parametrium, which can be seen with pelvic congestion syndrome. Electronically Signed   By: Zetta Bills M.D.   On: 01/18/2020 20:59   MR BRAIN W CONTRAST  Result Date: 01/19/2020 CLINICAL DATA:  Neoplastic workup. Status epilepticus. Polysubstance abuse. EXAM: MRI HEAD WITH CONTRAST TECHNIQUE: Multiplanar, multiecho pulse sequences of the brain and surrounding structures were obtained with intravenous contrast. CONTRAST:  7.35m GADAVIST GADOBUTROL 1 MMOL/ML IV SOLN COMPARISON:  MRI head without contrast 01/16/2020 FINDINGS: The patient not able to cooperate and hold still. Attempted repeat axial and coronal T2 images were performed and do not reveal any evidence of acute abnormality. Diffusion not performed. Postcontrast imaging degraded by motion but does not reveal any abnormal enhancement. No enhancing mass or fluid collection. IMPRESSION: Exam quality limited by motion. No acute abnormality.  Normal enhancement pattern. Electronically Signed   By: CFranchot GalloM.D.   On: 01/19/2020 10:19   CT ABDOMEN PELVIS W CONTRAST  Result Date: 01/18/2020 CLINICAL DATA:  Neoplastic syndrome suspected, encephalopathy status epilepticus EXAM: CT CHEST,  ABDOMEN, AND PELVIS WITH CONTRAST TECHNIQUE: Multidetector CT imaging of the chest, abdomen and pelvis was performed following the standard protocol during bolus administration of intravenous contrast. CONTRAST:  1074mOMNIPAQUE IOHEXOL 300 MG/ML  SOLN COMPARISON:  January 23, 2017 FINDINGS: CT CHEST FINDINGS Cardiovascular: Heart size is normal. Aortic caliber is normal. Central pulmonary vasculature is normal caliber, limited assessment on venous phase. Mediastinum/Nodes: Thoracic inlet structures without signs of adenopathy. Small lymph node at the LEFT thoracic inlet approximately 5 mm. No axillary lymphadenopathy. No mediastinal or hilar lymphadenopathy. Lungs/Pleura: Lungs are clear.  Airways are patent. Musculoskeletal: See below for full musculoskeletal detail. No chest wall lesion. CT ABDOMEN PELVIS FINDINGS Hepatobiliary: No focal, suspicious hepatic lesion. No portal venous abnormality. No pericholecystic stranding.  No gross biliary duct distension. Pancreas: Pancreas with normal contours. Normal enhancement and without signs of adjacent inflammation. Spleen: Spleen normal in size and contour. Adrenals/Urinary Tract: Adrenal glands are normal. Mild cortical scarring on the RIGHT. No hydronephrosis. No nephrolithiasis on contrasted imaging. Foley catheter collapses the urinary bladder. Small low-density lesions in the LEFT kidney stable since 2018 and likely small cysts Stomach/Bowel: Weighted tip tube in the gastric antrum. Stomach is collapsed limiting assessment without acute process. Normal appendix. Small bowel without signs of obstruction. Stool throughout the colon.  No pericolonic stranding. Vascular/Lymphatic: Normal caliber abdominal aorta. No signs of vascular disease or atherosclerotic changes. There is no gastrohepatic or hepatoduodenal ligament lymphadenopathy. No retroperitoneal or mesenteric lymphadenopathy. No pelvic sidewall lymphadenopathy. Reproductive: Uterus and adnexa are  unremarkable by CT aside from some venous engorgement along the LEFT parametrium. Other: Fat containing umbilical hernia. Musculoskeletal: No acute bone finding. No destructive bone process. IMPRESSION: 1. No acute findings in the chest, abdomen or pelvis. 2. No signs of neoplasm by CT or metastatic disease. 3. venous engorgement along the LEFT parametrium, which can be seen with pelvic congestion syndrome. Electronically Signed   By: GeZetta Bills.D.   On: 01/18/2020 20:59        Scheduled Meds: . Chlorhexidine Gluconate Cloth  6 each Topical Daily  . enoxaparin (LOVENOX) injection  40 mg Subcutaneous Q24H  . feeding supplement (PROSource TF)  45 mL Per Tube Daily  . influenza vac split quadrivalent PF  0.5 mL Intramuscular Tomorrow-1000  . insulin aspart  0-9 Units Subcutaneous TID WC  .  lactulose  20 g Oral Daily  . mouth rinse  15 mL Mouth Rinse BID  . PHENObarbital  65 mg Intravenous BID  . phenytoin (DILANTIN) IV  100 mg Intravenous Q8H  . pyridOXINE  100 mg Intravenous Daily  . [START ON 01/20/2020] thiamine injection  100 mg Intravenous Daily   Continuous Infusions: . dextrose 5% lactated ringers 75 mL/hr at 01/19/20 0750  . feeding supplement (OSMOLITE 1.2 CAL) 1,000 mL (01/15/20 1809)  . Immune Globulin 10% Stopped (01/18/20 1846)  . lacosamide (VIMPAT) IV 200 mg (01/19/20 1108)  . levETIRAcetam 1,000 mg (01/18/20 2040)  . thiamine injection Stopped (01/18/20 1847)     LOS: 13 days    Time spent: 35 minutes    Rini Moffit A Hannan Hutmacher, MD [facility]   If 7PM-7AM, please contact night-coverage [email or web address]  01/19/2020, 1:09 PM

## 2020-01-19 NOTE — Progress Notes (Signed)
  Speech Language Pathology Treatment: Dysphagia  [name] MRN: [id number] DOB: May 08, 1985 Today's Date: 01/19/2020 Time: 1320-1350 SLP Time Calculation (min) (ACUTE ONLY): 30 min  Assessment / Plan / Recommendation Clinical Impression  Patient seen to address dysphagia goals. Of note, patient continues with very limited oral intake and has Cortrak for medication and primary nutrition. SLP attempted cup and straw sips of thin liquids, however patient with very poor oral preparatory phase and was unable to suck through straw or form lips around cup. She did demonstrate some active participation when spoon sip offered, however SLP did pour most of liquid into front of oral cavity. Patient exhibited immediate anterior spillage of thin liquids. She did demonstrate some anterior mastication and manipulation with small ice chips and eventual initiation of swallow. Patient accepted spoon bites of smooth puree/thin texture with italian ice and consumed approximately 3 ounces. SLP held spoon in front of her lips and she would start to move her tongue to lick which was sign she was attentive and accepting of bolus. She did exhibit swallow initiation with [place] ice bites and no oral residuals noted.  Significant concern remains regarding long term nutrition.    HPI HPI: Pt is a 34 year old woman who presented to [facility]/04/2020 with suspected methamphetamine use-discharged and brought back the same day with confusion and agitation. Toxicology consistent only with benzodiazepine and THC and not on any stimulants. 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 Per chart, mentation has been fluctuating. PMH includes hepatitis C and polysubstance abuse.      SLP Plan  Continue with current plan of care       Recommendations  Diet recommendations: Thin liquid;Dysphagia 1 (puree) Liquids provided via: Cup;Teaspoon Medication Administration: Via  alternative means Supervision: Full supervision/cueing for compensatory strategies;Trained caregiver to feed patient Compensations: Minimize environmental distractions;Slow rate;Small sips/bites Postural Changes and/or Swallow Maneuvers: Seated upright 90 degrees                Oral Care Recommendations: Oral care QID Follow up Recommendations: Skilled Nursing facility SLP Visit Diagnosis: Dysphagia, unspecified (R13.10) Plan: Continue with current plan of care       GO                Angela Nevin, MA, CCC-SLP 01/19/20 4:10 PM

## 2020-01-19 NOTE — Progress Notes (Signed)
Peripherally Inserted Central Catheter Placement  The IV Nurse has discussed with the patient and/or persons authorized to consent for the patient, the purpose of this procedure and the potential benefits and risks involved with this procedure.  The benefits include less needle sticks, lab draws from the catheter, and the patient may be discharged home with the catheter. Risks include, but not limited to, infection, bleeding, blood clot (thrombus formation), and puncture of an artery; nerve damage and irregular heartbeat and possibility to perform a PICC exchange if needed/ordered by physician.  Alternatives to this procedure were also discussed.  Bard Power PICC patient education guide, fact sheet on infection prevention and patient information card has been provided to patient /or left at bedside.  Phone consent obtain from aunt Bartholome Bill  [facility] Placement Documentation  PICC Double Lumen 01/19/20 PICC Left Basilic 41 cm 0 cm (Active)  Indication for Insertion or Continuance of Line Prolonged intravenous therapies 01/19/20 1828  Exposed Catheter (cm) 0 cm 01/19/20 1828  Site Assessment Clean;Dry;Intact 01/19/20 1828  Lumen #1 Status Flushed;Saline locked;Blood return noted 01/19/20 1828  Lumen #2 Status Flushed;Saline locked;Blood return noted 01/19/20 1828  Dressing Type Transparent 01/19/20 1828  Dressing Status Clean;Dry;Intact 01/19/20 1828  Dressing Intervention New dressing 01/19/20 1828  Dressing Change Due 01/26/20 01/19/20 1828       Ethelda Chick 01/19/2020, 6:31 PM

## 2020-01-19 NOTE — Progress Notes (Signed)
MD notified pf patient's  FSBG of 212. Awaiting new orders.

## 2020-01-19 NOTE — Progress Notes (Signed)
VAST consulted to insert midline d/t multiple IV insertions and infiltrations. Educated unit RN, Arlys John that midline is not appropriate access d/t patient receiving Dilantin and phenobarbital. Requested that Arlys John reach out to provider for PICC order since patient will continue to receive these medications IV for some time.  VAST RN able to place USGIV in left arm for current use.  PICC order placed, although likely that  PICC nurses will not be able to place line until tomorrow. Arlys John, verbalized understanding.

## 2020-01-20 LAB — GLUCOSE, CAPILLARY
Glucose-Capillary: 134 mg/dL — ABNORMAL HIGH (ref 70–99)
Glucose-Capillary: 156 mg/dL — ABNORMAL HIGH (ref 70–99)
Glucose-Capillary: 161 mg/dL — ABNORMAL HIGH (ref 70–99)
Glucose-Capillary: 179 mg/dL — ABNORMAL HIGH (ref 70–99)
Glucose-Capillary: 183 mg/dL — ABNORMAL HIGH (ref 70–99)
Glucose-Capillary: 199 mg/dL — ABNORMAL HIGH (ref 70–99)

## 2020-01-20 LAB — T4, FREE: Free T4: 0.62 ng/dL (ref 0.61–1.12)

## 2020-01-20 LAB — IGA: IgA: 118 mg/dL (ref 87–352)

## 2020-01-20 LAB — TSH: TSH: 3.223 u[IU]/mL (ref 0.350–4.500)

## 2020-01-20 MED ORDER — QUETIAPINE FUMARATE 25 MG PO TABS
25.0000 mg | ORAL_TABLET | Freq: Two times a day (BID) | ORAL | Status: DC
  Administered 2020-01-20 – 2020-01-21 (×3): 25 mg
  Filled 2020-01-20 (×3): qty 1

## 2020-01-20 MED ORDER — LACTULOSE 10 GM/15ML PO SOLN
20.0000 g | Freq: Every day | ORAL | Status: DC
  Administered 2020-01-21 – 2020-01-23 (×3): 20 g
  Filled 2020-01-20 (×3): qty 30

## 2020-01-20 MED ORDER — DIAZEPAM 2 MG PO TABS
2.0000 mg | ORAL_TABLET | Freq: Two times a day (BID) | ORAL | Status: DC
  Administered 2020-01-20 – 2020-01-21 (×3): 2 mg
  Filled 2020-01-20 (×3): qty 1

## 2020-01-20 NOTE — Progress Notes (Addendum)
Subjective: No new events overnight.  ROS: Unable to obtain due to poor mental status  Examination  Vital signs in last 24 hours: Temp:  [98.4 F (36.9 C)-99 F (37.2 C)] 98.6 F (37 C) (09/29 0755) Pulse Rate:  [74-95] 88 (09/29 0755) Resp:  [14-20] 14 (09/29 0755) BP: (92-159)/(50-144) 112/70 (09/29 0755) SpO2:  [96 %-100 %] 98 % (09/29 0755)  General: lying in bed,not in apparent distress CVS: pulse-normal rate and rhythm RS: breathing comfortably,CTA B Extremities: normal,warm  Neuro:Was asleep but woke up to tactile stimuli, continues to be nonverbal/aphasic, pupils equally round and reactive, extraocular movements appear intact, no apparent facial asymmetry, moving all 4 extremities spontaneously  Basic Metabolic Panel: Recent Labs  Lab 01/15/20 0253 01/15/20 0253 01/15/20 1804 01/16/20 0601 01/16/20 0601 01/16/20 1656 01/17/20 0503 01/18/20 0318 01/19/20 0348  NA 139  --   --  139  --   --  139 140 137  K 3.3*  --   --  3.2*  --   --  3.8 4.2 3.8  CL 104  --   --  104  --   --  102 105 102  CO2 23  --   --  26  --   --  26 27 26   GLUCOSE 103*  --   --  189*  --   --  146* 178* 220*  BUN 28*  --   --  13  --   --  12 9 11   CREATININE 0.71  --   --  0.65  --   --  0.67 0.72 0.71  CALCIUM 8.4*   < >  --  8.7*   < >  --  8.7* 8.9 9.0  MG 2.2  --  2.3 2.0  --  2.2  --   --   --   PHOS  --   --  2.9 3.3  --  3.3  --   --   --    < > = values in this interval not displayed.    CBC: Recent Labs  Lab 01/15/20 0253 01/16/20 0601 01/17/20 0503 01/18/20 0318 01/19/20 0348  WBC 4.9 2.7* 4.1 3.7* 3.6*  HGB 14.6 13.8 14.2 13.1 12.6  HCT 42.6 39.4 41.0 38.8 37.9  MCV 88.2 87.0 88.9 89.4 90.0  PLT 179 151 169 120* 149*     Coagulation Studies: No results for input(s): LABPROT, INR in the last 72 hours.  Imaging MRI brain with contrast 01/19/2020: Motion degraded exam but did not show any acute abnormality, normal enhancement pattern.  ASSESSMENT AND  PLAN: 34 year old female with history of polysubstance abuse (methamphetamine, THC) who initially presented to [facility] on 01/06/2020 with encephalopathy. 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 Keppra was started and eventually Vimpat was added with improvement EEG but patient continued to be altered and therefore transferred to [facility]. On arrival, EEG showed sharp waves in left central temporal region as well as lateralized rhythmic delta activity in left hemisphere which improved after adding phenytoin and therefore was most likely ictal in nature. Since then phenobarbital also been added with further improvement in EEG (no more sharp waves). Of note, patient underwent lumbar puncture at [facility] on 01/05/2020 which did not show any pleocytosis and had normal protein and glucose. 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 During this whole time, patient has had fluctuating mental status, at 1  point she was able to follow commands and say simple words but has since had worsening again and is currently nonverbal and following simple commands intermittently.   Encephalopathy Nonconvulsive status epilepticus (resolved) Polysubstance abuse -  Differentials include autoimmune/paraneoplastic encephalitis, prolonged encephalopathy secondary to substance abuse/postictal state, psychiatric etiology  Recommendations -On empiric IVIG for D3/5  - Patient has had 2 doses of IVIG without any change in mental status.  It is not uncommon for IVIG to take days to weeks for effect.  There is also the possibility that patient has a prolonged postictal state due to status epilepticus originating from left central temporal region.  However, without any evidence of MRI changes and no improvement whatsoever over the last few days, it is difficult to prognosticate if and how much recovery patient  will have -I tried contacting patient's father but he went to voicemail.  We will continue to attempt to contact him for goals of care discussion. -Patient is currently requiring tube feeds due to limited oral intake and per speech evaluation, there is concern regarding long-term nutrition. -Discussed my concerns with Dr. Maryfrances Bunnell.  Plan is to communicate with patient's family regarding long-term plan for nutrition if patient continues to be in the current state. -Continue lacosamide Keppra 1000 mg twice daily, 200 mg twice daily, phenytoin 100 every 8 hours and phenobarb 65 mg twice daily -Continue seizure precautions -As needed IV Ativan 2 mg for clinical seizure-like activity -Management of rest of comorbidities per primary team   I have spent a total of1minuteswith the patient reviewing hospitalnotes, test results, labs and examining the patient as well as establishing an assessment and plan.>50% of time was spent in direct patient care.  Lindie Spruce Epilepsy [facility] For questions after 5pm please refer to AMION to reach the Neurologist on call

## 2020-01-20 NOTE — Consult Note (Signed)
[facility] Face-to-Face Psychiatry Consult   Reason for Consult:  Encephalopathy, neuro recommend psych consult Referring Physician:  Dr. Maryfrances Bunnell Patient Identification: Anita Davis MRN:  [id number] Principal Diagnosis: Refractory seizure (HCC) Diagnosis:  Principal Problem:   Refractory seizure (HCC) Active Problems:   Acute metabolic encephalopathy   Hypokalemia   Polysubstance abuse (HCC)   Elevated CK   Transaminitis   Chronic hepatitis C without hepatic coma (HCC)   Total Time spent with patient: 30 minutes  Subjective:   Anita Davis is a 34 y.o. female patient admitted with disorientation which was suspected to be substance abuse.  On evaluation patient was alert, and was able to move her eyes and extremities on command.  However patient is nonverbal, and was unable to answer any questions.  She appeared to have some ongoing confusion and disorientation. Per chart review she presented to the ER x2 this month for complaints of Anxiety, was prescribed Vistaril with no effect. UDS was not obtained during these visits, however her UDS was positive for BZD and THC. Per chart review she has history of BZD, excessive use of Benadryl, methadone, methamphetamines and PCP. During the hospital visit in 09/20 she reported taking 6mg  of xanax daily.  Due to patient's current mentation unable to further complete assessment.  HPI:   34 year old female with history of polysubstance abuse (methamphetamine, THC) who initially presented to [facility] on 01/06/2020 with encephalopathy. 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 Keppra was started and eventually Vimpat was added with improvement EEG but patient continued to be altered and therefore transferred to [facility]. On arrival, EEG showed sharp waves in left central temporal region as well as lateralized rhythmic delta activity in left hemisphere which improved after adding phenytoin and  therefore was most likely ictal in nature. Since then phenobarbital also been added with further improvement in EEG (no more sharp waves). Of note, patient underwent lumbar puncture at [facility] on 01/05/2020 which did not show any pleocytosis and had normal protein and glucose. 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 During this whole time, patient has had fluctuating mental status, at 1 point she was able to follow commands and say simple words but has since had worsening again and is currently nonverbal and following simple commands intermittently.  Past Psychiatric History: Substance abuse.   Risk to Self:  Unable to assess Risk to Others:   Unable to assess Prior Inpatient Therapy:   Unable to assess Prior Outpatient Therapy:   UTA, per chart substance abuse detox facilities, and [facility]  Past Medical History:  Past Medical History:  Diagnosis Date  . Allergy   . Bronchitis   . Hepatitis-C   . History of gestational diabetes   . HPV in female   . Substance abuse [facility])     Past Surgical History:  Procedure Laterality Date  . HEMORRHOID BANDING  age 40   Family History:  Family History  Problem Relation Age of Onset  . Depression Mother   . Cervical cancer Mother   . Hypertension Father   . Diabetes Father    Family Psychiatric  History: UTA Social History:  Social History   Substance and Sexual Activity  Alcohol Use No     Social History   Substance and Sexual Activity  Drug Use Not Currently  . Types: IV, Cocaine, Hydrocodone, Oxycodone, Marijuana, Heroin   Comment: takes methadone- now taking subutex (03/27/19)  Social History   Socioeconomic History  . Marital status: Single    Spouse name: Not on file  . Number of children: Not on file  . Years of education: Not on file  . Highest education level: Not on file  Occupational History  . Not on file  Tobacco Use  . Smoking status: Current  Every Day Smoker    Years: 24.00    Types: Cigarettes, Cigars  . Smokeless tobacco: Never Used  . Tobacco comment: 1 cigar/daily. former 1 pack cigarette smoker quit 05/2019  currently smokes CBD  Vaping Use  . Vaping Use: Every day  . Substances: Nicotine, Flavoring  Substance and Sexual Activity  . Alcohol use: No  . Drug use: Not Currently    Types: IV, Cocaine, Hydrocodone, Oxycodone, Marijuana, Heroin    Comment: takes methadone- now taking subutex (03/27/19)  . Sexual activity: Not Currently    Birth control/protection: Abstinence  Other Topics Concern  . Not on file  Social History Narrative  . Not on file   Social Determinants of Health   Financial Resource Strain:   . Difficulty of Paying Living Expenses: Not on file  Food Insecurity:   . Worried About Programme researcher, broadcasting/film/video in the Last Year: Not on file  . Ran Out of Food in the Last Year: Not on file  Transportation Needs:   . Lack of Transportation (Medical): Not on file  . Lack of Transportation (Non-Medical): Not on file  Physical Activity:   . Days of Exercise per Week: Not on file  . Minutes of Exercise per Session: Not on file  Stress:   . Feeling of Stress : Not on file  Social Connections:   . Frequency of Communication with Friends and Family: Not on file  . Frequency of Social Gatherings with Friends and Family: Not on file  . Attends Religious Services: Not on file  . Active Member of Clubs or Organizations: Not on file  . Attends Banker Meetings: Not on file  . Marital Status: Not on file   Additional Social History:    Allergies:  No Known Allergies  Labs:  Results for orders placed or performed during the hospital encounter of 01/04/20 (from the past 48 hour(s))  Glucose, capillary     Status: Abnormal   Collection Time: 01/18/20  4:00 PM  Result Value Ref Range   Glucose-Capillary 177 (H) 70 - 99 mg/dL    Comment: Glucose reference range applies only to samples taken after  fasting for at least 8 hours.  Glucose, capillary     Status: Abnormal   Collection Time: 01/18/20  7:48 PM  Result Value Ref Range   Glucose-Capillary 155 (H) 70 - 99 mg/dL    Comment: Glucose reference range applies only to samples taken after fasting for at least 8 hours.  Glucose, capillary     Status: Abnormal   Collection Time: 01/19/20 12:19 AM  Result Value Ref Range   Glucose-Capillary 212 (H) 70 - 99 mg/dL    Comment: Glucose reference range applies only to samples taken after fasting for at least 8 hours.  CBC     Status: Abnormal   Collection Time: 01/19/20  3:48 AM  Result Value Ref Range   WBC 3.6 (L) 4.0 - 10.5 K/uL   RBC 4.21 3.87 - 5.11 MIL/uL   Hemoglobin 12.6 12.0 - 15.0 g/dL   HCT 56.2 36 - 46 %   MCV 90.0 80.0 - 100.0 fL  MCH 29.9 26.0 - 34.0 pg   MCHC 33.2 30.0 - 36.0 g/dL   RDW 23.7 62.8 - 31.5 %   Platelets 149 (L) 150 - 400 K/uL   nRBC 0.0 0.0 - 0.2 %    Comment: Performed at [facility], [address], [place], [place] [address]  Comprehensive metabolic panel     Status: Abnormal   Collection Time: 01/19/20  3:48 AM  Result Value Ref Range   Sodium 137 135 - 145 mmol/L   Potassium 3.8 3.5 - 5.1 mmol/L   Chloride 102 98 - 111 mmol/L   CO2 26 22 - 32 mmol/L   Glucose, Bld 220 (H) 70 - 99 mg/dL    Comment: Glucose reference range applies only to samples taken after fasting for at least 8 hours.   BUN 11 6 - 20 mg/dL   Creatinine, Ser 0.73 0.44 - 1.00 mg/dL   Calcium 9.0 8.9 - 71.0 mg/dL   Total Protein 6.1 (L) 6.5 - 8.1 g/dL   Albumin 3.0 (L) 3.5 - 5.0 g/dL   AST 626 (H) 15 - 41 U/L   ALT 326 (H) 0 - 44 U/L   Alkaline Phosphatase 81 38 - 126 U/L   Total Bilirubin 0.7 0.3 - 1.2 mg/dL   GFR calc non Af Amer >60 >60 mL/min   GFR calc Af Amer >60 >60 mL/min   Anion gap 9 5 - 15    Comment: Performed at [facility], [address] [place], [place] [address]  Hemoglobin A1c     Status: Abnormal   Collection Time: 01/19/20  3:48 AM   Result Value Ref Range   Hgb A1c MFr Bld 5.9 (H) 4.8 - 5.6 %    Comment: (NOTE) Pre diabetes:          5.7%-6.4%  Diabetes:              >6.4%  Glycemic control for   <7.0% adults with diabetes    Mean Plasma Glucose 122.63 mg/dL    Comment: Performed at [facility], [address], [place], [place] [address]  Glucose, capillary     Status: Abnormal   Collection Time: 01/19/20  4:03 AM  Result Value Ref Range   Glucose-Capillary 215 (H) 70 - 99 mg/dL    Comment: Glucose reference range applies only to samples taken after fasting for at least 8 hours.  Glucose, capillary     Status: Abnormal   Collection Time: 01/19/20  8:05 AM  Result Value Ref Range   Glucose-Capillary 187 (H) 70 - 99 mg/dL    Comment: Glucose reference range applies only to samples taken after fasting for at least 8 hours.   Comment 1 Notify RN    Comment 2 Document in Chart   Glucose, capillary     Status: Abnormal   Collection Time: 01/19/20 11:23 AM  Result Value Ref Range   Glucose-Capillary 133 (H) 70 - 99 mg/dL    Comment: Glucose reference range applies only to samples taken after fasting for at least 8 hours.   Comment 1 Notify RN    Comment 2 Document in Chart   Glucose, capillary     Status: Abnormal   Collection Time: 01/19/20  3:43 PM  Result Value Ref Range   Glucose-Capillary 170 (H) 70 - 99 mg/dL    Comment: Glucose reference range applies only to samples taken after fasting for at least 8 hours.   Comment 1  Notify RN    Comment 2 Document in Chart   Glucose, capillary     Status: Abnormal   Collection Time: 01/19/20  5:26 PM  Result Value Ref Range   Glucose-Capillary 180 (H) 70 - 99 mg/dL    Comment: Glucose reference range applies only to samples taken after fasting for at least 8 hours.   Comment 1 Document in Chart   Glucose, capillary     Status: Abnormal   Collection Time: 01/19/20  7:12 PM  Result Value Ref Range   Glucose-Capillary 198 (H) 70 - 99 mg/dL    Comment:  Glucose reference range applies only to samples taken after fasting for at least 8 hours.   Comment 1 Notify RN    Comment 2 Document in Chart   Glucose, capillary     Status: Abnormal   Collection Time: 01/20/20 12:11 AM  Result Value Ref Range   Glucose-Capillary 156 (H) 70 - 99 mg/dL    Comment: Glucose reference range applies only to samples taken after fasting for at least 8 hours.  Glucose, capillary     Status: Abnormal   Collection Time: 01/20/20  3:51 AM  Result Value Ref Range   Glucose-Capillary 199 (H) 70 - 99 mg/dL    Comment: Glucose reference range applies only to samples taken after fasting for at least 8 hours.  Glucose, capillary     Status: Abnormal   Collection Time: 01/20/20  7:59 AM  Result Value Ref Range   Glucose-Capillary 183 (H) 70 - 99 mg/dL    Comment: Glucose reference range applies only to samples taken after fasting for at least 8 hours.  Glucose, capillary     Status: Abnormal   Collection Time: 01/20/20 11:43 AM  Result Value Ref Range   Glucose-Capillary 134 (H) 70 - 99 mg/dL    Comment: Glucose reference range applies only to samples taken after fasting for at least 8 hours.    Current Facility-Administered Medications  Medication Dose Route Frequency Provider Last Rate Last Admin  . Chlorhexidine Gluconate Cloth 2 % PADS 6 each  6 each Topical Daily Dahal, Melina Schools, MD   6 each at 01/20/20 1036  . dextrose 5 % in lactated ringers infusion   Intravenous Continuous Regalado, Belkys A, MD 75 mL/hr at 01/19/20 0750 New Bag at 01/19/20 0750  . enoxaparin (LOVENOX) injection 40 mg  40 mg Subcutaneous Q24H Adefeso, Oladapo, DO   40 mg at 01/19/20 2204  . feeding supplement (OSMOLITE 1.2 CAL) liquid 1,000 mL  1,000 mL Per Tube Continuous Regalado, Belkys A, MD   Stopped at 01/19/20 1610  . feeding supplement (PROSource TF) liquid 45 mL  45 mL Per Tube Daily Regalado, Belkys A, MD   45 mL at 01/20/20 1036  . Immune Globulin 10% (PRIVIGEN) IV infusion 30 g   400 mg/kg Intravenous Q24 Hr x 5 Lindie Spruce O, MD 22.8 mL/hr at 01/19/20 1945 30 g at 01/19/20 1945  . influenza vac split quadrivalent PF (FLUARIX) injection 0.5 mL  0.5 mL Intramuscular Tomorrow-1000 Virginia Rochester K, MD      . insulin aspart (novoLOG) injection 0-9 Units  0-9 Units Subcutaneous TID WC Bhagat, Srishti L, MD   2 Units at 01/20/20 0837  . lacosamide (VIMPAT) 200 mg in sodium chloride 0.9 % 25 mL IVPB  200 mg Intravenous Q12H Doonquah, Kofi, MD 90 mL/hr at 01/20/20 1045 200 mg at 01/20/20 1045  . [START ON 01/21/2020] lactulose (CHRONULAC) 10 GM/15ML solution 20  g  20 g Per Tube Daily Lodema Hong A, RPH      . levETIRAcetam (KEPPRA) IVPB 1000 mg/100 mL premix  1,000 mg Intravenous Q12H Charlsie Quest, MD 400 mL/hr at 01/20/20 0843 1,000 mg at 01/20/20 0843  . LORazepam (ATIVAN) injection 2 mg  2 mg Intravenous PRN Charlsie Quest, MD   2 mg at 01/19/20 2247  . LORazepam (ATIVAN) injection 2 mg  2 mg Intravenous Q4H PRN Charlsie Quest, MD      . LORazepam (ATIVAN) tablet 1 mg  1 mg Oral Q6H PRN Beryle Beams, MD   1 mg at 01/11/20 2008  . MEDLINE mouth rinse  15 mL Mouth Rinse BID Dahal, Melina Schools, MD   15 mL at 01/20/20 1039  . ondansetron (ZOFRAN) injection 4 mg  4 mg Intravenous Q6H PRN John Giovanni, MD   4 mg at 01/15/20 0235  . PHENObarbital (LUMINAL) injection 65 mg  65 mg Intravenous BID Milon Dikes, MD   65 mg at 01/20/20 1037  . phenytoin (DILANTIN) injection 100 mg  100 mg Intravenous Q8H Rejeana Brock, MD   100 mg at 01/20/20 0837  . pyridOXINE (B-6) injection 100 mg  100 mg Intravenous Daily Beryle Beams, MD   100 mg at 01/20/20 1036  . sodium chloride flush (NS) 0.9 % injection 10-40 mL  10-40 mL Intracatheter Q12H Regalado, Belkys A, MD   20 mL at 01/20/20 0847  . sodium chloride flush (NS) 0.9 % injection 10-40 mL  10-40 mL Intracatheter PRN Regalado, Belkys A, MD      . thiamine (B-1) injection 100 mg  100 mg Intravenous Daily Regalado,  Belkys A, MD   100 mg at 01/20/20 1037    Musculoskeletal: Strength & Muscle Tone: decreased Gait & Station: UTA Patient leans: N/A  Psychiatric Specialty Exam: Physical Exam  Review of Systems  Blood pressure (!) 94/54, pulse 86, temperature 98.7 F (37.1 C), temperature source Rectal, resp. rate 18, height  (1.651 m), weight 76.1 kg, SpO2 98 %.Body mass index is 27.92 kg/m.  General Appearance: Disheveled  Eye Contact:  Minimal  Speech:  None  Volume:  Normal  Mood:  NA  Affect:  NA  Thought Process:  NA  Orientation:  NA  Thought Content:  NA  Suicidal Thoughts:  UTA  Homicidal Thoughts:  UTA  Memory:  NA  Judgement:  NA  Insight:  NA  Psychomotor Activity:  NA  Concentration:  UTA  Recall:  NA  Fund of Knowledge:  NA  Language:  NA  Akathisia:  NA  Handed:  Right  AIMS (if indicated):     Assets:  Others:  UTA  ADL's:  Impaired  Cognition:  Impaired,  Severe  Sleep:        Treatment Plan Summary: Plan Current etiology remains unclear. Although her history documents extensive use of long term opiates, methadone, and benzodiazapines. Since admission her mentation has waxed and waned, although there is no clear cut comparsion. This may be due to withdrawl of some one of these substances. Will start low dose long acting bzd valium  po BID, and montior closely for improvement. Will also repeat TSH to check for thyroid abnormalities due to her current mentation and non resposiveness to current treatment modalities. Will add low dose seroquel  po BID, as an all out effort to improve mentation/aphasia/catanoia state.   Disposition: Will closely continue to montior her, although there is limited history of psych documented with  the exception of substance abuse as noted above.   Maryagnes Amosakia S Starkes-Perry, FNP 01/20/2020 12:27 PM

## 2020-01-20 NOTE — Progress Notes (Signed)
Nutrition Follow-up  DOCUMENTATION CODES:   Not applicable  INTERVENTION:  Continue via Cortrak: -Osmolite 1.2 cal @ 80m/hr -440mProsource TF daily  Provides 1912 kcals, 97 grams of protein, and 127953mree water    NUTRITION DIAGNOSIS:   Inadequate oral intake related to lethargy/confusion as evidenced by meal completion < 25%.  Ongoing  GOAL:   Patient will meet greater than or equal to 90% of their needs  Met with TF  MONITOR:   TF tolerance, Labs, Weight trends, I & O's  REASON FOR ASSESSMENT:   Consult Enteral/tube feeding initiation and management  ASSESSMENT:   Pt with refractory seizures with acute metabolic encephalopathy. PMH includes hepatitis and polysubstance abuse.  9/24 Cortrak placed (gastric)  Discussed pt with RN.   Pt continues to be nonverbal. Per MD, differential for encephalopathy includes autoinmune/paraneoplastic encephalitis, prolonged encephalopathy 2/2 substance abuse, and psychiatric etiology. CT with contrast negative for malignancy. MRI with contrast revealed no acute abnormality. Pt receiving IVIG.   Current TF via Cortrak: Osmolite 1.2 cal @ 30m86m with 45ml48msource TF daily. Provides 1912 kcals, 97 grams of protein, and 1279ml 34m water    Labs:CBGs [phone]: Novolog, Chronulac, B6 injections, thiamine injections IVF: D5 in LR @ 75ml/h8mUTRITION - FOCUSED PHYSICAL EXAM:    Most Recent Value  Orbital Region No depletion  Upper Arm Region No depletion  Thoracic and Lumbar Region No depletion  Buccal Region No depletion  Temple Region No depletion  Clavicle Bone Region Mild depletion  Clavicle and Acromion Bone Region No depletion  Scapular Bone Region No depletion  Dorsal Hand No depletion  Patellar Region No depletion  Anterior Thigh Region Mild depletion  Posterior Calf Region Mild depletion  Edema (RD Assessment) Mild  Hair Reviewed  Eyes Reviewed  Mouth Reviewed  Skin Reviewed  Nails  Reviewed       Diet Order:   Diet Order            DIET - DYS 1 Room service appropriate? Yes; Fluid consistency: Thin  Diet effective now                 EDUCATION NEEDS:   No education needs have been identified at this time  Skin:  Skin Assessment: Reviewed RN Assessment  Last BM:  9/26  Height:   Ht Readings from Last 1 Encounters:  01/06/20 5' 5"  (1.651 m)    Weight:   Wt Readings from Last 1 Encounters:  01/19/20 76.1 kg    BMI:  Body mass index is 27.92 kg/m.  Estimated Nutritional Needs:   Kcal:  1800-2000  Protein:  90-100 grams  Fluid:  >/=1.8L/d    Hadja Larkin InaD, LDN RD pager number and weekend/on-call pager number located in [email or web address]

## 2020-01-20 NOTE — Progress Notes (Signed)
[facility] PROGRESS NOTE    Anita Davis  ZOX:[id number] DOB: [birth year] DOA: 01/04/2020 PCP: [name], PA-C      Brief Narrative:  Anita Davis is a 34 y.o. F with hx substance use disorder, active, hepatitis C, and gestational diabetes who presented with confusion.  Patient initially admitted in September with encephalopathy, thought to be from drug overdose.  LP showed no signs of infection. MRI brain unremarkable. She remained encephalopathic, and EEG showed focal seizures.  The seizures were suppressed, and she was transferred to [facility] for continuous EEG, but after seizure control, she had poor neurologic recovery.  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  She deteriorated after steroids were stopped, so IVIG was started.       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  -Continue IVIG, day 3 of 5 -Continue tube feeds through NG -Continue phenobarbital, Vimpat, Keppra -Continue quetiapine  -Consult palliative care to assist with goals of care, PEG tube placement, placement and care if patient has no meaningful neurological recovery  -It appears the patient does not have an active POA, but her aunts, primarily Anita Davis are closely involved       Disposition: Status is: Inpatient  Remains inpatient appropriate because:IV treatments appropriate due to intensity of illness or inability to take PO   Dispo: The patient is from: Home              Anticipated d/c is to: SNF              Anticipated d/c date is: > 3 days              Patient currently is not medically stable to d/c.              MDM: The below labs and imaging reports were reviewed and summarized above.  Medication management as above.  DVT prophylaxis: enoxaparin (LOVENOX) injection 40 mg Start: 01/05/20  2230 SCDs Start: 01/05/20 2217  Code Status: Full code Family Communication: Both aunts by phone           Subjective: No fever.  Patient is nonverbal.  She has made no meaningful purposeful or intentional movements.  No spontaneous verbalizations.  Objective: Vitals:   01/20/20 1119 01/20/20 1315 01/20/20 1626 01/20/20 1936  BP: (!) 94/54 129/82 111/88 124/80  Pulse: 86 87 84 95  Resp: Temp: 98.7 F (37.1 C)  99.6 F (37.6 C) 98.4 F (36.9 C)  TempSrc: Rectal  Axillary Oral  SpO2: 98%  97% 99%  Weight:      Height:        Intake/Output Summary (Last 24 hours) at 01/20/2020 2009 Last data filed at 01/20/2020 1946 Gross per 24 hour  Intake 20 ml  Output 2350 ml  Net -2330 ml   Filed Weights   01/16/20 0447 01/17/20 0350 01/19/20 0358  Weight: 75 kg 74.3 kg 76.1 kg    Examination: General appearance:  adult female, does not seem to track with her eyes, follow commands, no spontaneous movements or verbalizations HEENT: Anicteric, conjunctiva pink, lids and lashes normal. No nasal deformity, discharge, epistaxis.  Lips moist.  NG tube in place.   Skin: Warm and dry.  No jaundice.  No suspicious rashes or lesions. Cardiac: RRR, nl S1-S2, no murmurs appreciated.  Capillary refill is brisk.  JVPnot visible.  No LE edema.  Radial  pulses 2+ and symmetric. Respiratory: Normal respiratory rate and rhythm.  CTAB without rales or wheezes. Abdomen: Abdomen soft.  No grimace to palpation No ascites, distension, hepatosplenomegaly.   MSK: No deformities or effusions. Neuro: When, but does not track or follow commands, no spontaneous verbalizations or movements    Data Reviewed: I have personally reviewed following labs and imaging studies:  CBC: Recent Labs  Lab 01/15/20 0253 01/16/20 0601 01/17/20 0503 01/18/20 0318 01/19/20 0348  WBC 4.9 2.7* 4.1 3.7* 3.6*  HGB 14.6 13.8 14.2 13.1 12.6  HCT 42.6 39.4 41.0 38.8 37.9  MCV 88.2 87.0 88.9 89.4 90.0  PLT  179 151 169 120* 149*   Basic Metabolic Panel: Recent Labs  Lab 01/15/20 0253 01/15/20 1804 01/16/20 0601 01/16/20 1656 01/17/20 0503 01/18/20 0318 01/19/20 0348  NA 139  --  139  --  139 140 137  K 3.3*  --  3.2*  --  3.8 4.2 3.8  CL 104  --  104  --  102 105 102  CO2 23  --  26  --  26 27 26   GLUCOSE 103*  --  189*  --  146* 178* 220*  BUN 28*  --  13  --  12 9 11   CREATININE 0.71  --  0.65  --  0.67 0.72 0.71  CALCIUM 8.4*  --  8.7*  --  8.7* 8.9 9.0  MG 2.2 2.3 2.0 2.2  --   --   --   PHOS  --  2.9 3.3 3.3  --   --   --    GFR: Estimated Creatinine Clearance: 99.1 mL/min (by C-G formula based on SCr of 0.71 mg/dL). Liver Function Tests: Recent Labs  Lab 01/15/20 0253 01/16/20 0601 01/17/20 0503 01/18/20 0318 01/19/20 0348  AST 310* 277* 135* 117* 126*  ALT 641* 629* 472* 350* 326*  ALKPHOS 45 66 74 67 81  BILITOT 1.1 0.8 0.7 0.8 0.7  PROT 6.3* 5.8* 5.9* 5.6* 6.1*  ALBUMIN 3.5 3.2* 3.1* 3.0* 3.0*   No results for input(s): LIPASE, AMYLASE in the last 168 hours. Recent Labs  Lab 01/15/20 0253 01/15/20 1804  AMMONIA 35 32   Coagulation Profile: No results for input(s): INR, PROTIME in the last 168 hours. Cardiac Enzymes: No results for input(s): CKTOTAL, CKMB, CKMBINDEX, TROPONINI in the last 168 hours. BNP (last 3 results) No results for input(s): PROBNP in the last 8760 hours. HbA1C: Recent Labs    01/19/20 0348  HGBA1C 5.9*   CBG: Recent Labs  Lab 01/20/20 0351 01/20/20 0759 01/20/20 1143 01/20/20 1543 01/20/20 1933  GLUCAP 199* 183* 134* 179* 161*   Lipid Profile: No results for input(s): CHOL, HDL, LDLCALC, TRIG, CHOLHDL, LDLDIRECT in the last 72 hours. Thyroid Function Tests: Recent Labs    01/20/20 1750 01/20/20 1755  TSH  --  3.223  FREET4 0.62  --    Anemia Panel: No results for input(s): VITAMINB12, FOLATE, FERRITIN, TIBC, IRON, RETICCTPCT in the last 72 hours. Urine analysis:    Component Value Date/Time   COLORURINE  YELLOW 01/09/2020 1356   APPEARANCEUR CLOUDY (A) 01/09/2020 1356   LABSPEC 1.018 01/09/2020 1356   PHURINE 8.0 01/09/2020 1356   GLUCOSEU NEGATIVE 01/09/2020 1356   HGBUR NEGATIVE 01/09/2020 1356   BILIRUBINUR NEGATIVE 01/09/2020 1356   KETONESUR 20 (A) 01/09/2020 1356   PROTEINUR NEGATIVE 01/09/2020 1356   NITRITE NEGATIVE 01/09/2020 1356   LEUKOCYTESUR NEGATIVE 01/09/2020 1356   Sepsis Labs: @LABRCNTIP (procalcitonin:4,lacticacidven:4)  )  No results found for this or any previous visit (from the past 240 hour(s)).       Radiology Studies: CT CHEST W CONTRAST  Result Date: 01/18/2020 CLINICAL DATA:  Neoplastic syndrome suspected, encephalopathy status epilepticus EXAM: CT CHEST, ABDOMEN, AND PELVIS WITH CONTRAST TECHNIQUE: Multidetector CT imaging of the chest, abdomen and pelvis was performed following the standard protocol during bolus administration of intravenous contrast. CONTRAST:  OMNIPAQUE IOHEXOL 300 MG/ML  SOLN COMPARISON:  January 23, 2017 FINDINGS: CT CHEST FINDINGS Cardiovascular: Heart size is normal. Aortic caliber is normal. Central pulmonary vasculature is normal caliber, limited assessment on venous phase. Mediastinum/Nodes: Thoracic inlet structures without signs of adenopathy. Small lymph node at the LEFT thoracic inlet approximately 5 mm. No axillary lymphadenopathy. No mediastinal or hilar lymphadenopathy. Lungs/Pleura: Lungs are clear.  Airways are patent. Musculoskeletal: See below for full musculoskeletal detail. No chest wall lesion. CT ABDOMEN PELVIS FINDINGS Hepatobiliary: No focal, suspicious hepatic lesion. No portal venous abnormality. No pericholecystic stranding.  No gross biliary duct distension. Pancreas: Pancreas with normal contours. Normal enhancement and without signs of adjacent inflammation. Spleen: Spleen normal in size and contour. Adrenals/Urinary Tract: Adrenal glands are normal. Mild cortical scarring on the RIGHT. No hydronephrosis. No  nephrolithiasis on contrasted imaging. Foley catheter collapses the urinary bladder. Small low-density lesions in the LEFT kidney stable since 2018 and likely small cysts Stomach/Bowel: Weighted tip tube in the gastric antrum. Stomach is collapsed limiting assessment without acute process. Normal appendix. Small bowel without signs of obstruction. Stool throughout the colon.  No pericolonic stranding. Vascular/Lymphatic: Normal caliber abdominal aorta. No signs of vascular disease or atherosclerotic changes. There is no gastrohepatic or hepatoduodenal ligament lymphadenopathy. No retroperitoneal or mesenteric lymphadenopathy. No pelvic sidewall lymphadenopathy. Reproductive: Uterus and adnexa are unremarkable by CT aside from some venous engorgement along the LEFT parametrium. Other: Fat containing umbilical hernia. Musculoskeletal: No acute bone finding. No destructive bone process. IMPRESSION: 1. No acute findings in the chest, abdomen or pelvis. 2. No signs of neoplasm by CT or metastatic disease. 3. venous engorgement along the LEFT parametrium, which can be seen with pelvic congestion syndrome. Electronically Signed   By: Donzetta Kohut M.D.   On: 01/18/2020 20:59   MR BRAIN W CONTRAST  Result Date: 01/19/2020 CLINICAL DATA:  Neoplastic workup. Status epilepticus. Polysubstance abuse. EXAM: MRI HEAD WITH CONTRAST TECHNIQUE: Multiplanar, multiecho pulse sequences of the brain and surrounding structures were obtained with intravenous contrast. CONTRAST:  7.70mL GADAVIST GADOBUTROL 1 MMOL/ML IV SOLN COMPARISON:  MRI head without contrast 01/16/2020 FINDINGS: The patient not able to cooperate and hold still. Attempted repeat axial and coronal T2 images were performed and do not reveal any evidence of acute abnormality. Diffusion not performed. Postcontrast imaging degraded by motion but does not reveal any abnormal enhancement. No enhancing mass or fluid collection. IMPRESSION: Exam quality limited by motion. No  acute abnormality.  Normal enhancement pattern. Electronically Signed   By: Marlan Palau M.D.   On: 01/19/2020 10:19   CT ABDOMEN PELVIS W CONTRAST  Result Date: 01/18/2020 CLINICAL DATA:  Neoplastic syndrome suspected, encephalopathy status epilepticus EXAM: CT CHEST, ABDOMEN, AND PELVIS WITH CONTRAST TECHNIQUE: Multidetector CT imaging of the chest, abdomen and pelvis was performed following the standard protocol during bolus administration of intravenous contrast. CONTRAST:  OMNIPAQUE IOHEXOL 300 MG/ML  SOLN COMPARISON:  January 23, 2017 FINDINGS: CT CHEST FINDINGS Cardiovascular: Heart size is normal. Aortic caliber is normal. Central pulmonary vasculature is normal caliber, limited assessment on venous phase.  Mediastinum/Nodes: Thoracic inlet structures without signs of adenopathy. Small lymph node at the LEFT thoracic inlet approximately 5 mm. No axillary lymphadenopathy. No mediastinal or hilar lymphadenopathy. Lungs/Pleura: Lungs are clear.  Airways are patent. Musculoskeletal: See below for full musculoskeletal detail. No chest wall lesion. CT ABDOMEN PELVIS FINDINGS Hepatobiliary: No focal, suspicious hepatic lesion. No portal venous abnormality. No pericholecystic stranding.  No gross biliary duct distension. Pancreas: Pancreas with normal contours. Normal enhancement and without signs of adjacent inflammation. Spleen: Spleen normal in size and contour. Adrenals/Urinary Tract: Adrenal glands are normal. Mild cortical scarring on the RIGHT. No hydronephrosis. No nephrolithiasis on contrasted imaging. Foley catheter collapses the urinary bladder. Small low-density lesions in the LEFT kidney stable since 2018 and likely small cysts Stomach/Bowel: Weighted tip tube in the gastric antrum. Stomach is collapsed limiting assessment without acute process. Normal appendix. Small bowel without signs of obstruction. Stool throughout the colon.  No pericolonic stranding. Vascular/Lymphatic: Normal caliber  abdominal aorta. No signs of vascular disease or atherosclerotic changes. There is no gastrohepatic or hepatoduodenal ligament lymphadenopathy. No retroperitoneal or mesenteric lymphadenopathy. No pelvic sidewall lymphadenopathy. Reproductive: Uterus and adnexa are unremarkable by CT aside from some venous engorgement along the LEFT parametrium. Other: Fat containing umbilical hernia. Musculoskeletal: No acute bone finding. No destructive bone process. IMPRESSION: 1. No acute findings in the chest, abdomen or pelvis. 2. No signs of neoplasm by CT or metastatic disease. 3. venous engorgement along the LEFT parametrium, which can be seen with pelvic congestion syndrome. Electronically Signed   By: Donzetta Kohut M.D.   On: 01/18/2020 20:59   DG CHEST PORT 1 VIEW  Result Date: 01/19/2020 CLINICAL DATA:  PICC placement. EXAM: PORTABLE CHEST 1 VIEW COMPARISON:  Chest radiographs 06/18/2019 and CT 01/18/2020 FINDINGS: A left PICC terminates near the superior cavoatrial junction. An enteric tube courses into the upper abdomen with tip not imaged. The cardiomediastinal silhouette is within normal limits. No confluent airspace opacity, edema, pleural effusion, or pneumothorax is identified. No acute osseous abnormality is seen. IMPRESSION: Left PICC terminates near the superior cavoatrial junction. Electronically Signed   By: Sebastian Ache M.D.   On: 01/19/2020 19:10   [place] EKG SITE RITE  Result Date: 01/19/2020 If Site Rite image not attached, placement could not be confirmed due to current cardiac rhythm.       Scheduled Meds: . Chlorhexidine Gluconate Cloth  6 each Topical Daily  . diazepam  2 mg Per Tube BID  . enoxaparin (LOVENOX) injection  40 mg Subcutaneous Q24H  . feeding supplement (PROSource TF)  45 mL Per Tube Daily  . influenza vac split quadrivalent PF  0.5 mL Intramuscular Tomorrow-1000  . insulin aspart  0-9 Units Subcutaneous TID WC  . [START ON 01/21/2020] lactulose  20 g Per Tube Daily   . mouth rinse  15 mL Mouth Rinse BID  . PHENObarbital  65 mg Intravenous BID  . phenytoin (DILANTIN) IV  100 mg Intravenous Q8H  . pyridOXINE  100 mg Intravenous Daily  . QUEtiapine  25 mg Per Tube BID  . sodium chloride flush  10-40 mL Intracatheter Q12H  . thiamine injection  100 mg Intravenous Daily   Continuous Infusions: . dextrose 5% lactated ringers 75 mL/hr at 01/20/20 1342  . feeding supplement (OSMOLITE 1.2 CAL) 1,000 mL (01/20/20 1330)  . Immune Globulin 10% 30 g (01/19/20 1945)  . lacosamide (VIMPAT) IV 200 mg (01/20/20 1045)  . levETIRAcetam 1,000 mg (01/20/20 0843)     LOS: 14 days  Time spent: 35 minutes    Alberteen Sam, MD [facility] 01/20/2020, 8:09 PM     Please page though AMION or Epic secure chat:  For [employer], Higher education careers adviser

## 2020-01-21 LAB — GLUCOSE, CAPILLARY
Glucose-Capillary: 127 mg/dL — ABNORMAL HIGH (ref 70–99)
Glucose-Capillary: 142 mg/dL — ABNORMAL HIGH (ref 70–99)
Glucose-Capillary: 159 mg/dL — ABNORMAL HIGH (ref 70–99)
Glucose-Capillary: 161 mg/dL — ABNORMAL HIGH (ref 70–99)
Glucose-Capillary: 164 mg/dL — ABNORMAL HIGH (ref 70–99)
Glucose-Capillary: 186 mg/dL — ABNORMAL HIGH (ref 70–99)

## 2020-01-21 LAB — T3, FREE: T3, Free: 2.2 pg/mL (ref 2.0–4.4)

## 2020-01-21 MED ORDER — FUROSEMIDE 10 MG/ML IJ SOLN
40.0000 mg | Freq: Once | INTRAMUSCULAR | Status: AC
  Administered 2020-01-21: 40 mg via INTRAVENOUS
  Filled 2020-01-21: qty 4

## 2020-01-21 MED ORDER — ACETAMINOPHEN 325 MG PO TABS
650.0000 mg | ORAL_TABLET | Freq: Four times a day (QID) | ORAL | Status: DC | PRN
  Administered 2020-01-21: 650 mg via ORAL
  Filled 2020-01-21: qty 2

## 2020-01-21 NOTE — Progress Notes (Signed)
Subjective: No changes overnight.  Was seen by psychiatry.  ROS: unable to obtain due to poor mental status  Examination  Vital signs in last 24 hours: Temp:  [97.8 F (36.6 C)-99.6 F (37.6 C)] 97.8 F (36.6 C) (09/30 0800) Pulse Rate:  [83-101] 95 (09/30 0800) Resp:  [11-20] 18 (09/30 0800) BP: (94-129)/(54-88) 126/74 (09/30 0800) SpO2:  [97 %-99 %] 98 % (09/30 0800) Weight:  [76.6 kg] 76.6 kg (09/30 0327)  General: lying in bed,not in apparent distress CVS: pulse-normal rate and rhythm RS: breathing comfortably,CTA B Extremities: normal,warm Neuro:Awake, continues to be nonverbal/aphasic, pupils equally round and reactive, extraocular movements appear intact, no apparent facial asymmetry, moving all 4 extremities spontaneously  Basic Metabolic Panel: Recent Labs  Lab 01/15/20 0253 01/15/20 0253 01/15/20 1804 01/16/20 0601 01/16/20 0601 01/16/20 1656 01/17/20 0503 01/18/20 0318 01/19/20 0348  NA 139  --   --  139  --   --  139 140 137  K 3.3*  --   --  3.2*  --   --  3.8 4.2 3.8  CL 104  --   --  104  --   --  102 105 102  CO2 23  --   --  26  --   --  26 27 26   GLUCOSE 103*  --   --  189*  --   --  146* 178* 220*  BUN 28*  --   --  13  --   --  12 9 11   CREATININE 0.71  --   --  0.65  --   --  0.67 0.72 0.71  CALCIUM 8.4*   < >  --  8.7*   < >  --  8.7* 8.9 9.0  MG 2.2  --  2.3 2.0  --  2.2  --   --   --   PHOS  --   --  2.9 3.3  --  3.3  --   --   --    < > = values in this interval not displayed.    CBC: Recent Labs  Lab 01/15/20 0253 01/16/20 0601 01/17/20 0503 01/18/20 0318 01/19/20 0348  WBC 4.9 2.7* 4.1 3.7* 3.6*  HGB 14.6 13.8 14.2 13.1 12.6  HCT 42.6 39.4 41.0 38.8 37.9  MCV 88.2 87.0 88.9 89.4 90.0  PLT 179 151 169 120* 149*     Coagulation Studies: No results for input(s): LABPROT, INR in the last 72 hours.  Imaging No new brain imaging overnight  ASSESSMENT AND PLAN: 34 year old female with history of polysubstance abuse  (methamphetamine, THC) who initially presented to [facility] on [facility]/06/2020 with encephalopathy. 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 Keppra was started and eventually Vimpat was added with improvement EEG but patient continued to be altered and therefore transferred to [facility]. On arrival, EEG showed sharp waves in left central temporal region as well as lateralized rhythmic delta activity in left hemisphere which improved after adding phenytoin and therefore was most likely ictal in nature. Since then phenobarbital also been added with further improvement in EEG (no more sharp waves). Of note, patient underwent lumbar puncture at [facility] on 01/05/2020 which did not show any pleocytosis and had normal protein and glucose. 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 During this whole time, patient has had fluctuating mental status, at 1 point she was able to follow commands and say  simple words but has since had worsening again and is currently nonverbal and following simple commands intermittently.   Encephalopathy Nonconvulsive status epilepticus (resolved) Polysubstance abuse -  Differentials include autoimmune/paraneoplastic encephalitis, prolonged encephalopathy secondary to substance abuse/postictal state, psychiatric etiology  Recommendations -Onempiric IVIG forD4/5 -Continue lacosamideKeppra 1000 mg twice daily,200 mg twice daily, phenytoin 100 every 8 hours and phenobarb 65 mg twice daily -Reviewed psychiatry note and agree with the plan -Continue seizure precautions -As needed IV Ativan 2 mg for clinical seizure-like activity -Management of rest of comorbidities per primary team   I have spent a total of61minuteswith the patient reviewing hospitalnotes, test results, labs and examining the patient as well as establishing an assessment and  plan.>50% of time was spent in direct patient care.   Lindie Spruce Epilepsy [facility] For questions after 5pm please refer to AMION to reach the Neurologist on call

## 2020-01-21 NOTE — Progress Notes (Signed)
Occupational Therapy Treatment [name] MRN: [id number] DOB: 1986/03/27 Today's Date: 01/21/2020    History of present illness 34 year old with past medical history significant for hepatitis C, polysubstance abuse brought to the hospital 9/13 for disorientation which was suspected to be substance abuse.  After she became more lucid, she was discharged, but then police brought her back later in the day and they found her passed out in a parking lot.  Urine drug screen was positive for benzodiazepine and THC. An EEG done on 9/15 showed patient was in status epilepticus.  Patient was a started on Keppra.  Despite these, EEG noted continued seizures requiring additional medications as well.  Finally on 918, seizures broke. Follow-up EEG on 9/20 noted evidence of [name] from the left central temporal region.  Repeat EEG done on 9/22 noted epileptogenic city from the left central temporal region.   OT comments  Patient continues to make steady progress towards goals in skilled OT session. Patient's session encompassed PROM, attempts to follow commands, visually track, and complete ADLs. Pt currently does not respond to name or any commands, and demonstrated minimal change in affect (startled) when wiping pts eyes with washcloth. Pt does not visually track, or participate in ROM or ADLs at this time. Discharge remains appropriate, will continue to follow acutely.     Follow Up Recommendations  SNF    Equipment Recommendations  3 in 1 bedside commode;Wheelchair (measurements OT);Wheelchair cushion (measurements OT);Hospital bed (hoyer)    Recommendations for Other Services      Precautions / Restrictions Precautions Precautions: Fall Precaution Comments: seizure, cortrak Restrictions Weight Bearing Restrictions: No       Mobility Bed Mobility                  Transfers                      Balance                                            ADL either performed or assessed with clinical judgement   ADL Overall ADL's : Needs assistance/impaired     Grooming: Total assistance;Wash/dry hands;Wash/dry face                               Functional mobility during ADLs: Total assistance;+2 for physical assistance;+2 for safety/equipment General ADL Comments: Only responded to therapist wiping patients eyes, otherwise eyes open with slackjaw presentation     Vision       Perception     Praxis      Cognition Arousal/Alertness: Awake/alert Behavior During Therapy: Flat affect Overall Cognitive Status: No family/caregiver present to determine baseline cognitive functioning                                 General Comments: No following of commands, does not visually track, doesnt respond to name call        Exercises General Exercises - Upper Extremity Elbow Flexion: PROM;10 reps;Both;Supine Elbow Extension: PROM;Both;Supine;10 reps Wrist Flexion: PROM;Both;10 reps;Supine Wrist Extension: PROM;Both;Supine;10 reps Digit Composite Flexion: PROM;Both;10 reps;Supine Composite Extension: PROM;Both;10 reps;Supine General Exercises - Lower Extremity Ankle Circles/Pumps: PROM;Both;10 reps;Supine   Shoulder Instructions  General Comments      Pertinent Vitals/ Pain       Pain Assessment: Faces Faces Pain Scale: No hurt  Home Living                                          Prior Functioning/Environment              Frequency  Min 2X/week        Progress Toward Goals  OT Goals(current goals can now be found in the care plan section)  Progress towards OT goals: Progressing toward goals  Acute Rehab OT Goals Patient Stated Goal: none stated and not responses made for any needs OT Goal Formulation: Patient unable to participate in goal setting Time For Goal Achievement: 02/01/20 Potential to Achieve Goals: Fair  Plan Discharge  plan remains appropriate    Co-evaluation                 AM-PAC OT "6 Clicks" Daily Activity     Outcome Measure   Help from another person eating meals?: Total Help from another person taking care of personal grooming?: Total Help from another person toileting, which includes using toliet, bedpan, or urinal?: Total Help from another person bathing (including washing, rinsing, drying)?: Total Help from another person to put on and taking off regular upper body clothing?: Total Help from another person to put on and taking off regular lower body clothing?: Total 6 Click Score: 6    End of Session    OT Visit Diagnosis: Unsteadiness on feet (R26.81);Cognitive communication deficit (R41.841)   Activity Tolerance Patient limited by lethargy   Patient Left in bed;with call bell/phone within reach;with bed alarm set   Nurse Communication Mobility status;Precautions        Time: 7253-6644 OT Time Calculation (min): 16 min  Charges: OT General Charges $OT Visit: 1 Visit OT Treatments $Self Care/Home Management : 8-22 mins  Anita Davis, COTA/L Acute Rehabilitation Services [phone]   Anita Davis 01/21/2020, 11:40 AM

## 2020-01-21 NOTE — Progress Notes (Signed)
[facility] PROGRESS NOTE    Anita Davis  FAO:[id number] DOB: 08/14/1985 DOA: 01/04/2020 PCP: [name], PA-C      Brief Narrative:  Anita Davis is a 34 y.o. F with hx substance use disorder, active, hepatitis C, and gestational diabetes who presented with confusion.  Patient initially admitted in September with encephalopathy, thought to be from drug overdose.  LP showed no signs of infection. MRI brain unremarkable. She remained encephalopathic, and EEG showed focal seizures.  The seizures were suppressed, and she was transferred to [facility] for continuous EEG, but after seizure control, she had poor neurologic recovery.  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  She deteriorated after steroids were stopped, so IVIG was started.       Assessment & Plan:  Acute now persistent metabolic encephalopathy Patient was admitted with acute metabolic encephalopathy, which is now persisted for several weeks.  The differential for this appears to be an autoimmune encephalitis, paraneoplastic encephalitis, or post status epilepticus encephalopathy.  TSH normal.  Neuraxial imaging unremarkable.    The clinical picture is not consistent with psychiatric catatonia, nor an opiate/alcohol withdrawal syndrome.  Adding sedatives such as scheduled diazepam or Seroquel therefore are unlikely to provide clinical benefit, and cloud the ability to observe her for improvement in mentation.  -Continue IVIG, day 4 of 5 -Continue tube feeds -Continue phenobarbital, Vimpat, Keppra -Stop quetiapine -Stop diazepam scheduled    -Consult palliative care to assist with goals of care, PEG tube placement, placement and care if patient has no meaningful neurological recovery  -It appears the patient does not have an active POA, but her aunts, primarily Anita Davis are closely involved   Edema Due to IV  fluids -Stop IV fluids -IV Lasix x1         Disposition: Status is: Inpatient  Remains inpatient appropriate because:IV treatments appropriate due to intensity of illness or inability to take PO   Dispo: The patient is from: Home              Anticipated d/c is to: SNF              Anticipated d/c date is: > 3 days              Patient currently is not medically stable to d/c.    The patient was admitted with a severe encephalopathy, which is not improved despite burst suppression and resolution of status epilepticus, high-dose steroids for autoimmune encephalitis, and now with several days of IVIG.  It appears that her prognosis is darkening.         MDM: The below labs and imaging reports were reviewed and summarized above.  Medication management as above.  DVT prophylaxis: enoxaparin (LOVENOX) injection 40 mg Start: 01/05/20 2230 SCDs Start: 01/05/20 2217  Code Status: Full code Family Communication:             Subjective: No new fever. The patient makes no purposeful movements or spontaneous verbalizations. No respiratory distress or obvious pain per nursing.  Objective: Vitals:   01/21/20 0800 01/21/20 1200 01/21/20 1500 01/21/20 1719  BP: 126/74 122/86 127/84 126/88  Pulse: 95 99 (!) 104 (!) 103  Resp: 18 16 16 15   Temp: 97.8 F (36.6 C) 97.7 F (36.5 C) 98.7 F (37.1 C) 97.6 F (36.4 C)  TempSrc: Axillary Axillary Oral Oral  SpO2: 98% 99% 98% 97%  Weight:      Height:  Intake/Output Summary (Last 24 hours) at 01/21/2020 1806 Last data filed at 01/21/2020 1500 Gross per 24 hour  Intake 106 ml  Output 4040 ml  Net -3934 ml   Filed Weights   01/17/20 0350 01/19/20 0358 01/21/20 0327  Weight: 74.3 kg 76.1 kg 76.6 kg    Examination: General appearance: Adult female, lying in bed, comatose. Does not open eyes to touch. Does not withdraw from pain. HEENT:     Skin: Skin warm and dry, no suspicious rashes or lesions Cardiac:  RRR, no murmurs. She has diffuse nonpitting edema Respiratory: Normal respiratory rate and rhythm, lungs clear without rales or wheezes. Abdomen: Abdomen soft, no grimace to palpation, no ascites or distention MSK: No deformities or effusions. Neuro: No spontaneous verbalizations or movements. Does not open eyes or follow commands. Does not track. Pupils equal and round.   Data Reviewed: I have personally reviewed following labs and imaging studies:  CBC: Recent Labs  Lab 01/15/20 0253 01/16/20 0601 01/17/20 0503 01/18/20 0318 01/19/20 0348  WBC 4.9 2.7* 4.1 3.7* 3.6*  HGB 14.6 13.8 14.2 13.1 12.6  HCT 42.6 39.4 41.0 38.8 37.9  MCV 88.2 87.0 88.9 89.4 90.0  PLT 179 151 169 120* 149*   Basic Metabolic Panel: Recent Labs  Lab 01/15/20 0253 01/15/20 1804 01/16/20 0601 01/16/20 1656 01/17/20 0503 01/18/20 0318 01/19/20 0348  NA 139  --  139  --  139 140 137  K 3.3*  --  3.2*  --  3.8 4.2 3.8  CL 104  --  104  --  102 105 102  CO2 23  --  26  --  26 27 26   GLUCOSE 103*  --  189*  --  146* 178* 220*  BUN 28*  --  13  --  12 9 11   CREATININE 0.71  --  0.65  --  0.67 0.72 0.71  CALCIUM 8.4*  --  8.7*  --  8.7* 8.9 9.0  MG 2.2 2.3 2.0 2.2  --   --   --   PHOS  --  2.9 3.3 3.3  --   --   --    GFR: Estimated Creatinine Clearance: 99.5 mL/min (by C-G formula based on SCr of 0.71 mg/dL). Liver Function Tests: Recent Labs  Lab 01/15/20 0253 01/16/20 0601 01/17/20 0503 01/18/20 0318 01/19/20 0348  AST 310* 277* 135* 117* 126*  ALT 641* 629* 472* 350* 326*  ALKPHOS 45 66 74 67 81  BILITOT 1.1 0.8 0.7 0.8 0.7  PROT 6.3* 5.8* 5.9* 5.6* 6.1*  ALBUMIN 3.5 3.2* 3.1* 3.0* 3.0*   No results for input(s): LIPASE, AMYLASE in the last 168 hours. Recent Labs  Lab 01/15/20 0253 01/15/20 1804  AMMONIA 35 32   Coagulation Profile: No results for input(s): INR, PROTIME in the last 168 hours. Cardiac Enzymes: No results for input(s): CKTOTAL, CKMB, CKMBINDEX, TROPONINI in the  last 168 hours. BNP (last 3 results) No results for input(s): PROBNP in the last 8760 hours. HbA1C: Recent Labs    01/19/20 0348  HGBA1C 5.9*   CBG: Recent Labs  Lab 01/20/20 2343 01/21/20 0355 01/21/20 0807 01/21/20 1244 01/21/20 1722  GLUCAP 164* 161* 186* 127* 159*   Lipid Profile: No results for input(s): CHOL, HDL, LDLCALC, TRIG, CHOLHDL, LDLDIRECT in the last 72 hours. Thyroid Function Tests: Recent Labs    01/20/20 1750 01/20/20 1755  TSH  --  3.223  FREET4 0.62  --   T3FREE 2.2  --  Anemia Panel: No results for input(s): VITAMINB12, FOLATE, FERRITIN, TIBC, IRON, RETICCTPCT in the last 72 hours. Urine analysis:    Component Value Date/Time   COLORURINE YELLOW 01/09/2020 1356   APPEARANCEUR CLOUDY (A) 01/09/2020 1356   LABSPEC 1.018 01/09/2020 1356   PHURINE 8.0 01/09/2020 1356   GLUCOSEU NEGATIVE 01/09/2020 1356   HGBUR NEGATIVE 01/09/2020 1356   BILIRUBINUR NEGATIVE 01/09/2020 1356   KETONESUR 20 (A) 01/09/2020 1356   PROTEINUR NEGATIVE 01/09/2020 1356   NITRITE NEGATIVE 01/09/2020 1356   LEUKOCYTESUR NEGATIVE 01/09/2020 1356   Sepsis Labs: @LABRCNTIP (procalcitonin:4,lacticacidven:4)  )No results found for this or any previous visit (from the past 240 hour(s)).       Radiology Studies: DG CHEST PORT 1 VIEW  Result Date: 01/19/2020 CLINICAL DATA:  PICC placement. EXAM: PORTABLE CHEST 1 VIEW COMPARISON:  Chest radiographs 06/18/2019 and CT 01/18/2020 FINDINGS: A left PICC terminates near the superior cavoatrial junction. An enteric tube courses into the upper abdomen with tip not imaged. The cardiomediastinal silhouette is within normal limits. No confluent airspace opacity, edema, pleural effusion, or pneumothorax is identified. No acute osseous abnormality is seen. IMPRESSION: Left PICC terminates near the superior cavoatrial junction. Electronically Signed   By: 01/20/2020 M.D.   On: 01/19/2020 19:10        Scheduled Meds: .  Chlorhexidine Gluconate Cloth  6 each Topical Daily  . enoxaparin (LOVENOX) injection  40 mg Subcutaneous Q24H  . feeding supplement (PROSource TF)  45 mL Per Tube Daily  . influenza vac split quadrivalent PF  0.5 mL Intramuscular Tomorrow-1000  . insulin aspart  0-9 Units Subcutaneous TID WC  . lactulose  20 g Per Tube Daily  . mouth rinse  15 mL Mouth Rinse BID  . PHENObarbital  65 mg Intravenous BID  . phenytoin (DILANTIN) IV  100 mg Intravenous Q8H  . pyridOXINE  100 mg Intravenous Daily  . sodium chloride flush  10-40 mL Intracatheter Q12H  . thiamine injection  100 mg Intravenous Daily   Continuous Infusions: . feeding supplement (OSMOLITE 1.2 CAL) 65 mL/hr at 01/21/20 0800  . Immune Globulin 10% Stopped (01/20/20 2349)  . lacosamide (VIMPAT) IV 200 mg (01/21/20 1131)  . levETIRAcetam 1,000 mg (01/21/20 0911)     LOS: 15 days    Time spent: 25 minutes    01/23/20, MD [facility] 01/21/2020, 6:06 PM     Please page though AMION or Epic secure chat:  For 01/23/2020, [employer]

## 2020-01-22 ENCOUNTER — Inpatient Hospital Stay (HOSPITAL_COMMUNITY): Payer: Medicaid Other

## 2020-01-22 DIAGNOSIS — Z515 Encounter for palliative care: Secondary | ICD-10-CM

## 2020-01-22 DIAGNOSIS — R140 Abdominal distension (gaseous): Secondary | ICD-10-CM

## 2020-01-22 LAB — GLUCOSE, CAPILLARY
Glucose-Capillary: 105 mg/dL — ABNORMAL HIGH (ref 70–99)
Glucose-Capillary: 125 mg/dL — ABNORMAL HIGH (ref 70–99)
Glucose-Capillary: 127 mg/dL — ABNORMAL HIGH (ref 70–99)
Glucose-Capillary: 153 mg/dL — ABNORMAL HIGH (ref 70–99)
Glucose-Capillary: 178 mg/dL — ABNORMAL HIGH (ref 70–99)
Glucose-Capillary: 183 mg/dL — ABNORMAL HIGH (ref 70–99)

## 2020-01-22 MED ORDER — BISACODYL 10 MG RE SUPP
10.0000 mg | Freq: Once | RECTAL | Status: AC
Start: 2020-01-22 — End: 2020-01-22
  Administered 2020-01-22: 10 mg via RECTAL
  Filled 2020-01-22: qty 1

## 2020-01-22 MED ORDER — POLYETHYLENE GLYCOL 3350 17 G PO PACK
17.0000 g | PACK | Freq: Every day | ORAL | Status: DC
  Administered 2020-01-22 – 2020-01-25 (×4): 17 g via ORAL
  Filled 2020-01-22 (×5): qty 1

## 2020-01-22 MED ORDER — BISACODYL 10 MG RE SUPP: 10.0000 mg | Freq: Once | RECTAL | Status: DC

## 2020-01-22 MED ORDER — SENNA 8.6 MG PO TABS
1.0000 | ORAL_TABLET | Freq: Every day | ORAL | Status: DC
  Administered 2020-01-23 – 2020-01-25 (×3): 8.6 mg via ORAL
  Filled 2020-01-22 (×3): qty 1

## 2020-01-22 MED ORDER — FAMOTIDINE 40 MG/5ML PO SUSR
20.0000 mg | Freq: Every day | ORAL | Status: DC
  Administered 2020-01-22 – 2020-01-25 (×4): 20 mg via ORAL
  Filled 2020-01-22 (×5): qty 2.5

## 2020-01-22 MED ORDER — LACTATED RINGERS IV SOLN: INTRAVENOUS | Status: AC

## 2020-01-22 NOTE — Progress Notes (Addendum)
Subjective: No acute events overnight.  Patient's father at bedside.  ROS: Unable to obtain due to poor mental status  Examination  Vital signs in last 24 hours: Temp:  [97.4 F (36.3 C)-100.6 F (38.1 C)] 98.4 F (36.9 C) (10/01 1209) Pulse Rate:  [86-104] 89 (10/01 1209) Resp:  [12-23] 20 (10/01 1209) BP: (118-146)/(75-98) 139/98 (10/01 1209) SpO2:  [97 %-99 %] 99 % (10/01 1209)  General: lying in bed,not in apparent distress CVS: pulse-normal rate and rhythm RS: breathing comfortably,CTA B Extremities: normal,warm Neuro:Awake, continues to be nonverbal/aphasic, pupils equally round and reactive, extraocular movements appear intact, no apparent facial asymmetry, moving all 4 extremities spontaneously  Basic Metabolic Panel: Recent Labs  Lab 01/15/20 1804 01/16/20 0601 01/16/20 0601 01/16/20 1656 01/17/20 0503 01/18/20 0318 01/19/20 0348  NA  --  139  --   --  139 140 137  K  --  3.2*  --   --  3.8 4.2 3.8  CL  --  104  --   --  102 105 102  CO2  --  26  --   --  26 27 26   GLUCOSE  --  189*  --   --  146* 178* 220*  BUN  --  13  --   --  12 9 11   CREATININE  --  0.65  --   --  0.67 0.72 0.71  CALCIUM  --  8.7*   < >  --  8.7* 8.9 9.0  MG 2.3 2.0  --  2.2  --   --   --   PHOS 2.9 3.3  --  3.3  --   --   --    < > = values in this interval not displayed.    CBC: Recent Labs  Lab 01/16/20 0601 01/17/20 0503 01/18/20 0318 01/19/20 0348  WBC 2.7* 4.1 3.7* 3.6*  HGB 13.8 14.2 13.1 12.6  HCT 39.4 41.0 38.8 37.9  MCV 87.0 88.9 89.4 90.0  PLT 151 169 120* 149*     Coagulation Studies: No results for input(s): LABPROT, INR in the last 72 hours.  Imaging No new brain imaging overnight  ASSESSMENT AND PLAN:34 year old female with history of polysubstance abuse (methamphetamine, THC) who initially presented to [facility] on 01/06/2020 with encephalopathy. 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 Keppra  was started and eventually Vimpat was added with improvement EEG but patient continued to be altered and therefore transferred to [facility]. On arrival, EEG showed sharp waves in left central temporal region as well as lateralized rhythmic delta activity in left hemisphere which improved after adding phenytoin and therefore was most likely ictal in nature. Since then phenobarbital also been added with further improvement in EEG (no more sharp waves). Of note, patient underwent lumbar puncture at [facility] on 01/05/2020 which did not show any pleocytosis and had normal protein and glucose. 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 During this whole time, patient has had fluctuating mental status, at 1 point she was able to follow commands and say simple words but has since had worsening again and is currently nonverbal and not following commands  Encephalopathy Nonconvulsive status epilepticus (resolved) Polysubstance abuse - Differentials include autoimmune/paraneoplastic encephalitis versus less likely prolonged encephalopathy secondary to substance abuse/postictal state, psychiatric etiology  Recommendations -Onempiric IVIG forD5/5 -Continue lacosamideKeppra 1000 mg twice daily,200 mg twice daily, phenytoin 100 every 8 hours and phenobarb  65 mg twice daily -Patient has not had any change in mental status/speech in the last 5 days.  It can take weeks for IVIG to have an effect.  Therefore, it is difficult to state how much improvement in speech will the patient have.   -No further inpatient neurologic work-up required.  We will reevaluate in 6 to 8 weeks during outpatient neurology follow-up .  Might also consider repeat EEG and MRI brain at that time. -Discussed current management plan in detail with patient's father at bedside along with Dr. Maryfrances Bunnell. -Continue seizure precautions -As needed IV Ativan 2 mg for clinical  seizure-like activity -Management of rest of comorbidities per primary team   I have spent a total of38minuteswith the patient reviewing hospitalnotes, test results, labs and examining the patient as well as establishing an assessment and plan.>50% of time was spent in direct patient care.   Lindie Spruce Epilepsy [facility] For questions after 5pm please refer to AMION to reach the Neurologist on call

## 2020-01-22 NOTE — Progress Notes (Signed)
[facility] PROGRESS NOTE    Anita Davis  YPP:[id number] DOB: 09/11/1985 DOA: 01/04/2020 PCP: [name], PA-C      Brief Narrative:  Anita Davis is a 34 y.o. F with hx substance use disorder, active, hepatitis C, and gestational diabetes who presented with confusion.  Patient initially admitted in September with encephalopathy, thought to be from drug overdose.  LP showed no signs of infection. MRI brain unremarkable. She remained encephalopathic, and EEG showed focal seizures.  The seizures were suppressed, and she was transferred to [facility] for continuous EEG, but after seizure control, she had poor neurologic recovery.  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  She deteriorated after steroids were stopped, so IVIG was started.       Assessment & Plan:  Acute now persistent metabolic encephalopathy Patient was admitted with acute metabolic encephalopathy, which is now persisted for several weeks.  The differential for this appears to be an autoimmune encephalitis, paraneoplastic encephalitis, or post status epilepticus encephalopathy.  TSH normal.  Neuraxial imaging unremarkable.    The clinical picture is not consistent with psychiatric catatonia, nor an opiate/alcohol withdrawal syndrome.  Adding sedatives such as scheduled diazepam or Seroquel therefore are unlikely to provide clinical benefit, and cloud the ability to observe her for improvement in mentation.  -Continue IVIG day 5 of 5 -Continue phenobarbital, Vimpat, Keppra  -Avoid sedatives  -Consult palliative care to assist with goals of care, PEG tube placement, placement and care if patient has no meaningful neurological recovery  -It appears the patient does not have an active POA, but her aunts, primarily Anita Davis are closely involved   Fever Brief fever yesterday.  No focal signs of pneumonia, UTI. -Follow  fever curve  Rash on right forearm Unclear cause, suspect pressure -Local wound care  Abdominal distention X-ray shows ileus -Start tube feeds -Start IV fluids -Repeat x-ray tomorrow -Bowel disimpaction          Disposition: Status is: Inpatient  Remains inpatient appropriate because:IV treatments appropriate due to intensity of illness or inability to take PO   Dispo: The patient is from: Home              Anticipated d/c is to: SNF              Anticipated d/c date is: > 3 days              Patient currently is not medically stable to d/c.    The patient was admitted with a severe encephalopathy, which is not improved despite burst suppression and resolution of status epilepticus, high-dose steroids for autoimmune encephalitis, and now with several days of IVIG.  It appears that her prognosis is darkening.         MDM: The below labs and imaging reports were reviewed and summarized above.  Medication management as above.  DVT prophylaxis: enoxaparin (LOVENOX) injection 40 mg Start: 01/05/20 2230 SCDs Start: 01/05/20 2217  Code Status: Full code Family Communication:             Subjective: Fever overnight. Belly distended today, and patient gagged on tube feeds.  Constipated.  Patient tracks briefly, made some spontaneous but not purposeful movements today.     Objective: Vitals:   01/22/20 0339 01/22/20 0810 01/22/20 1209 01/22/20 1542  BP: 131/88 120/89 (!) 139/98 126/83  Pulse: 95 88 89 99  Resp: 17 16 20 20   Temp: 99.1 F (37.3  C) 99 F (37.2 C) 98.4 F (36.9 C) 98.8 F (37.1 C)  TempSrc: Axillary Axillary Axillary Oral  SpO2: 99% 98% 99% 97%  Weight:      Height:        Intake/Output Summary (Last 24 hours) at 01/22/2020 1706 Last data filed at 01/22/2020 1308 Gross per 24 hour  Intake --  Output 1325 ml  Net -1325 ml   Filed Weights   01/17/20 0350 01/19/20 0358 01/21/20 0327  Weight: 74.3 kg 76.1 kg 76.6 kg     Examination: General appearance: Adult female, lying in bed, comatose. Does not open eyes to touch. Does not withdraw from pain. HEENT:     Skin: scattered track marks, hyperpigmentation marks, also red blister on right forearm:    Cardiac: RRR, no murmurs. She has diffuse nonpitting edema Respiratory: Normal respiratory rate and rhythm, lungs clear without rales or wheezes. Abdomen: Abdomen soft, no grimace to palpation, noticed distension MSK: No deformities or effusions. Neuro: No spontaneous verbalizations or movements. Does not open eyes or follow commands. Does not track. Pupils equal and round.   Data Reviewed: I have personally reviewed following labs and imaging studies:  CBC: Recent Labs  Lab 01/16/20 0601 01/17/20 0503 01/18/20 0318 01/19/20 0348  WBC 2.7* 4.1 3.7* 3.6*  HGB 13.8 14.2 13.1 12.6  HCT 39.4 41.0 38.8 37.9  MCV 87.0 88.9 89.4 90.0  PLT 151 169 120* 149*   Basic Metabolic Panel: Recent Labs  Lab 01/15/20 1804 01/16/20 0601 01/16/20 1656 01/17/20 0503 01/18/20 0318 01/19/20 0348  NA  --  139  --  139 140 137  K  --  3.2*  --  3.8 4.2 3.8  CL  --  104  --  102 105 102  CO2  --  26  --  26 27 26   GLUCOSE  --  189*  --  146* 178* 220*  BUN  --  13  --  12 9 11   CREATININE  --  0.65  --  0.67 0.72 0.71  CALCIUM  --  8.7*  --  8.7* 8.9 9.0  MG 2.3 2.0 2.2  --   --   --   PHOS 2.9 3.3 3.3  --   --   --    GFR: Estimated Creatinine Clearance: 99.5 mL/min (by C-G formula based on SCr of 0.71 mg/dL). Liver Function Tests: Recent Labs  Lab 01/16/20 0601 01/17/20 0503 01/18/20 0318 01/19/20 0348  AST 277* 135* 117* 126*  ALT 629* 472* 350* 326*  ALKPHOS 66 74 67 81  BILITOT 0.8 0.7 0.8 0.7  PROT 5.8* 5.9* 5.6* 6.1*  ALBUMIN 3.2* 3.1* 3.0* 3.0*   No results for input(s): LIPASE, AMYLASE in the last 168 hours. Recent Labs  Lab 01/15/20 1804  AMMONIA 32   Coagulation Profile: No results for input(s): INR, PROTIME in the last 168  hours. Cardiac Enzymes: No results for input(s): CKTOTAL, CKMB, CKMBINDEX, TROPONINI in the last 168 hours. BNP (last 3 results) No results for input(s): PROBNP in the last 8760 hours. HbA1C: No results for input(s): HGBA1C in the last 72 hours. CBG: Recent Labs  Lab 01/22/20 0048 01/22/20 0348 01/22/20 0813 01/22/20 1205 01/22/20 1555  GLUCAP 153* 178* 183* 127* 125*   Lipid Profile: No results for input(s): CHOL, HDL, LDLCALC, TRIG, CHOLHDL, LDLDIRECT in the last 72 hours. Thyroid Function Tests: Recent Labs    01/20/20 1750 01/20/20 1755  TSH  --  3.223  FREET4 0.62  --  T3FREE 2.2  --    Anemia Panel: No results for input(s): VITAMINB12, FOLATE, FERRITIN, TIBC, IRON, RETICCTPCT in the last 72 hours. Urine analysis:    Component Value Date/Time   COLORURINE YELLOW 01/09/2020 1356   APPEARANCEUR CLOUDY (A) 01/09/2020 1356   LABSPEC 1.018 01/09/2020 1356   PHURINE 8.0 01/09/2020 1356   GLUCOSEU NEGATIVE 01/09/2020 1356   HGBUR NEGATIVE 01/09/2020 1356   BILIRUBINUR NEGATIVE 01/09/2020 1356   KETONESUR 20 (A) 01/09/2020 1356   PROTEINUR NEGATIVE 01/09/2020 1356   NITRITE NEGATIVE 01/09/2020 1356   LEUKOCYTESUR NEGATIVE 01/09/2020 1356   Sepsis Labs: @LABRCNTIP (procalcitonin:4,lacticacidven:4)  )No results found for this or any previous visit (from the past 240 hour(s)).       Radiology Studies: DG Abd 1 View  Result Date: 01/22/2020 CLINICAL DATA:  Abdominal distension. EXAM: ABDOMEN - 1 VIEW COMPARISON:  CT chest, abdomen and pelvis 01/18/2020. FINDINGS: Feeding tube is in place with the tip in the distal stomach. There is a large volume of stool and gaseous distention of the colon. No evidence of free intraperitoneal air on this single view. No acute bony abnormality. IMPRESSION: Findings compatible with ileus. Large colonic stool burden also noted. Feeding tube tip is in the distal stomach. Electronically Signed   By: 01/20/2020 M.D.   On: 01/22/2020  12:44   DG CHEST PORT 1 VIEW  Result Date: 01/22/2020 CLINICAL DATA:  Abdominal distension. EXAM: PORTABLE CHEST 1 VIEW COMPARISON:  Single-view of the chest 01/19/2020. FINDINGS: Lungs clear. Heart size normal. No pneumothorax or pleural fluid. Feeding tube courses into the stomach and below the inferior margin the film. IMPRESSION: No acute disease. Electronically Signed   By: 01/21/2020 M.D.   On: 01/22/2020 12:45        Scheduled Meds: . Chlorhexidine Gluconate Cloth  6 each Topical Daily  . enoxaparin (LOVENOX) injection  40 mg Subcutaneous Q24H  . famotidine  20 mg Oral Daily  . influenza vac split quadrivalent PF  0.5 mL Intramuscular Tomorrow-1000  . insulin aspart  0-9 Units Subcutaneous TID WC  . lactulose  20 g Per Tube Daily  . mouth rinse  15 mL Mouth Rinse BID  . PHENObarbital  65 mg Intravenous BID  . phenytoin (DILANTIN) IV  100 mg Intravenous Q8H  . polyethylene glycol  17 g Oral Daily  . pyridOXINE  100 mg Intravenous Daily  . senna  1 tablet Oral QHS  . sodium chloride flush  10-40 mL Intracatheter Q12H  . thiamine injection  100 mg Intravenous Daily   Continuous Infusions: . Immune Globulin 10% 0 g (01/20/20 2349)  . lacosamide (VIMPAT) IV 200 mg (01/22/20 1039)  . lactated ringers 75 mL/hr at 01/22/20 1344  . levETIRAcetam 1,000 mg (01/22/20 0904)     LOS: 16 days    Time spent: 25 minutes    03/23/20, MD [facility] 01/22/2020, 5:06 PM     Please page though AMION or Epic secure chat:  For 03/23/2020, [employer]
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
## 2020-01-22 NOTE — Progress Notes (Signed)
SLP Cancellation Note  [name] MRN: [id number] DOB: 21-Sep-1985   Cancelled treatment:       Reason Eval/Treat Not Completed: Other (Pt agitated, yelling at staff members.  Multiple nursing team members at bedside trying to calm pt.   Will defer treatment until pt is better able to participate)   Faaris Arizpe, Melanee Spry 01/22/2020, 2:07 PM

## 2020-01-23 ENCOUNTER — Inpatient Hospital Stay (HOSPITAL_COMMUNITY): Payer: Medicaid Other

## 2020-01-23 LAB — GLUCOSE, CAPILLARY
Glucose-Capillary: 100 mg/dL — ABNORMAL HIGH (ref 70–99)
Glucose-Capillary: 105 mg/dL — ABNORMAL HIGH (ref 70–99)
Glucose-Capillary: 111 mg/dL — ABNORMAL HIGH (ref 70–99)
Glucose-Capillary: 111 mg/dL — ABNORMAL HIGH (ref 70–99)
Glucose-Capillary: 111 mg/dL — ABNORMAL HIGH (ref 70–99)
Glucose-Capillary: 127 mg/dL — ABNORMAL HIGH (ref 70–99)

## 2020-01-23 MED ORDER — LACTULOSE 10 GM/15ML PO SOLN
20.0000 g | Freq: Two times a day (BID) | ORAL | Status: DC
  Administered 2020-01-23 – 2020-01-24 (×3): 20 g
  Filled 2020-01-23 (×3): qty 30

## 2020-01-23 MED ORDER — GLYCERIN (LAXATIVE) 2.1 G RE SUPP
1.0000 | Freq: Once | RECTAL | Status: AC
  Administered 2020-01-23: 1 via RECTAL
  Filled 2020-01-23: qty 1

## 2020-01-23 MED ORDER — BISACODYL 10 MG RE SUPP
10.0000 mg | Freq: Once | RECTAL | Status: AC
Start: 2020-01-23 — End: 2020-01-23
  Administered 2020-01-23: 10 mg via RECTAL
  Filled 2020-01-23: qty 1

## 2020-01-23 MED ORDER — LACTATED RINGERS IV SOLN: INTRAVENOUS | Status: AC

## 2020-01-23 NOTE — Progress Notes (Signed)
[facility] PROGRESS NOTE    Anita Davis  WUJ:[id number] DOB: 01-02-1986 DOA: 01/04/2020 PCP: [name], PA-C      Brief Narrative:  Anita Davis is a 34 y.o. F with hx substance use disorder, active, hepatitis C, and gestational diabetes who presented with confusion.  Patient initially admitted in September with encephalopathy, thought to be from drug overdose.  LP showed no signs of infection. MRI brain unremarkable. She remained encephalopathic, and EEG showed focal seizures.  The seizures were suppressed, and she was transferred to [facility] for continuous EEG, but after seizure control, she had poor neurologic recovery.  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  She deteriorated after steroids were stopped, so IVIG was started.       Assessment & Plan:  Acute now persistent metabolic encephalopathy Patient was admitted with acute metabolic encephalopathy, which is now persisted for several weeks.  The differential for this appears to be an autoimmune encephalitis, paraneoplastic encephalitis, or post status epilepticus encephalopathy.  TSH normal.  Neuraxial imaging unremarkable.    The clinical picture is not consistent with psychiatric catatonia, nor an opiate/alcohol withdrawal syndrome.  Adding sedatives such as scheduled diazepam or Seroquel therefore are unlikely to provide clinical benefit, and cloud the ability to observe her for improvement in mentation.  Completed IVIG yesterday -Continue phenobarbital, Vimpat, Keppra -Avoid sedatives  -Consult palliative care to assist with goals of care, PEG tube placement, placement and care if patient has no meaningful neurological recovery     Fever 9/30 No fever since.  Ileus X-ray reviewed again, no improvement -Hold tube feeds -Continue IV fluids -Continue bowel regimen          Disposition: Status is:  Inpatient  Remains inpatient appropriate because:IV treatments appropriate due to intensity of illness or inability to take PO   Dispo: The patient is from: Home              Anticipated d/c is to: SNF              Anticipated d/c date is: > 3 days              Patient currently is not medically stable to d/c.    The patient was admitted with a severe encephalopathy, which is not improved despite burst suppression and resolution of status epilepticus, high-dose steroids for autoimmune encephalitis.  She has completed IVIG.  We will place PEG, proceed towards SNF placement          MDM: The below labs and imaging reports were reviewed and summarized above.  Medication management as above.  DVT prophylaxis: enoxaparin (LOVENOX) injection 40 mg Start: 01/05/20 2230 SCDs Start: 01/05/20 2217  Code Status: Full code Family Communication:             Subjective: No further fever, belly distention unchanged from previous, still constipated, 1 bowel movement overnight.  No respiratory distress, no apparent abdominal pain  Objective: Vitals:   01/22/20 2357 01/23/20 0414 01/23/20 0800 01/23/20 0820  BP: 104/67 118/76 121/82   Pulse: 95 90 85   Resp: 20 13 (!) 26 15  Temp: 99.5 F (37.5 C) 98.1 F (36.7 C) 98.6 F (37 C)   TempSrc:  Oral Oral   SpO2: 96% 98% 100%   Weight:      Height:        Intake/Output Summary (Last 24 hours) at 01/23/2020 1108 Last data filed at 01/23/2020  1791 Gross per 24 hour  Intake --  Output 1770 ml  Net -1770 ml   Filed Weights   01/17/20 0350 01/19/20 0358 01/21/20 0327  Weight: 74.3 kg 76.1 kg 76.6 kg    Examination: General appearance: Adult female, lying in bed, restless, makes eye contact briefly, but eyes are moving.  Makes no purposeful movements, no interaction. HEENT:    Anicteric, conjunctive pink, lids and lashes normal.  No nasal deformity, discharge, or epistaxis Skin: No change to hyperpigmented marks on arms and  legs.  In order to straighten the right forearm.  Cardiac: RRR, no murmurs, mild nonfocal pitting edema Respiratory: Normal respiratory rate and rhythm, lungs clear without rales or wheezes Abdomen: Abdomen soft, no grimace to palpation, distention.  No rigidity or rebound.  No guarding. MSK: No deformities or effusions. Neuro: She is restless, but makes no spontaneous verbalizations, no intelligible verbalizations, no obviously purposeful movements or following commands.   Data Reviewed: I have personally reviewed following labs and imaging studies:  CBC: Recent Labs  Lab 01/17/20 0503 01/18/20 0318 01/19/20 0348  WBC 4.1 3.7* 3.6*  HGB 14.2 13.1 12.6  HCT 41.0 38.8 37.9  MCV 88.9 89.4 90.0  PLT 169 120* 149*   Basic Metabolic Panel: Recent Labs  Lab 01/16/20 1656 01/17/20 0503 01/18/20 0318 01/19/20 0348  NA  --  139 140 137  K  --  3.8 4.2 3.8  CL  --  102 105 102  CO2  --  26 27 26   GLUCOSE  --  146* 178* 220*  BUN  --  12 9 11   CREATININE  --  0.67 0.72 0.71  CALCIUM  --  8.7* 8.9 9.0  MG 2.2  --   --   --   PHOS 3.3  --   --   --    GFR: Estimated Creatinine Clearance: 99.5 mL/min (by C-G formula based on SCr of 0.71 mg/dL). Liver Function Tests: Recent Labs  Lab 01/17/20 0503 01/18/20 0318 01/19/20 0348  AST 135* 117* 126*  ALT 472* 350* 326*  ALKPHOS 74 67 81  BILITOT 0.7 0.8 0.7  PROT 5.9* 5.6* 6.1*  ALBUMIN 3.1* 3.0* 3.0*   No results for input(s): LIPASE, AMYLASE in the last 168 hours. No results for input(s): AMMONIA in the last 168 hours. Coagulation Profile: No results for input(s): INR, PROTIME in the last 168 hours. Cardiac Enzymes: No results for input(s): CKTOTAL, CKMB, CKMBINDEX, TROPONINI in the last 168 hours. BNP (last 3 results) No results for input(s): PROBNP in the last 8760 hours. HbA1C: No results for input(s): HGBA1C in the last 72 hours. CBG: Recent Labs  Lab 01/22/20 1555 01/22/20 2017 01/23/20 0032 01/23/20 0417  01/23/20 0807  GLUCAP 125* 105* 111* 111* 127*   Lipid Profile: No results for input(s): CHOL, HDL, LDLCALC, TRIG, CHOLHDL, LDLDIRECT in the last 72 hours. Thyroid Function Tests: Recent Labs    01/20/20 1750 01/20/20 1755  TSH  --  3.223  FREET4 0.62  --   T3FREE 2.2  --    Anemia Panel: No results for input(s): VITAMINB12, FOLATE, FERRITIN, TIBC, IRON, RETICCTPCT in the last 72 hours. Urine analysis:    Component Value Date/Time   COLORURINE YELLOW 01/09/2020 1356   APPEARANCEUR CLOUDY (A) 01/09/2020 1356   LABSPEC 1.018 01/09/2020 1356   PHURINE 8.0 01/09/2020 1356   GLUCOSEU NEGATIVE 01/09/2020 1356   HGBUR NEGATIVE 01/09/2020 1356   BILIRUBINUR NEGATIVE 01/09/2020 1356   KETONESUR 20 (A) 01/09/2020  1356   PROTEINUR NEGATIVE 01/09/2020 1356   NITRITE NEGATIVE 01/09/2020 1356   LEUKOCYTESUR NEGATIVE 01/09/2020 1356   Sepsis Labs: @LABRCNTIP (procalcitonin:4,lacticacidven:4)  )No results found for this or any previous visit (from the past 240 hour(s)).       Radiology Studies: DG Abd 1 View  Result Date: 01/23/2020 CLINICAL DATA:  Ileus EXAM: ABDOMEN - 1 VIEW COMPARISON:  January 22, 2020 FINDINGS: Persistent diffuse gaseous dilation of colon. Large colonic stool burden. Incomplete visualization of the pubic symphysis. Feeding tube tip terminates over the proximal stomach. Incomplete visualization of the upper abdomen. No acute osseous abnormality. IMPRESSION: 1. Persistent diffuse gaseous dilation of colon likely reflecting ileus versus underlying dysmotility. Large colonic stool burden. 2.  Feeding tube tip terminates over the proximal stomach. Electronically Signed   By: January 24, 2020 MD   On: 01/23/2020 10:34   DG Abd 1 View  Result Date: 01/22/2020 CLINICAL DATA:  Abdominal distension. EXAM: ABDOMEN - 1 VIEW COMPARISON:  CT chest, abdomen and pelvis 01/18/2020. FINDINGS: Feeding tube is in place with the tip in the distal stomach. There is a large volume of  stool and gaseous distention of the colon. No evidence of free intraperitoneal air on this single view. No acute bony abnormality. IMPRESSION: Findings compatible with ileus. Large colonic stool burden also noted. Feeding tube tip is in the distal stomach. Electronically Signed   By: 01/20/2020 M.D.   On: 01/22/2020 12:44   DG CHEST PORT 1 VIEW  Result Date: 01/22/2020 CLINICAL DATA:  Abdominal distension. EXAM: PORTABLE CHEST 1 VIEW COMPARISON:  Single-view of the chest 01/19/2020. FINDINGS: Lungs clear. Heart size normal. No pneumothorax or pleural fluid. Feeding tube courses into the stomach and below the inferior margin the film. IMPRESSION: No acute disease. Electronically Signed   By: 01/21/2020 M.D.   On: 01/22/2020 12:45        Scheduled Meds: . Chlorhexidine Gluconate Cloth  6 each Topical Daily  . enoxaparin (LOVENOX) injection  40 mg Subcutaneous Q24H  . famotidine  20 mg Oral Daily  . influenza vac split quadrivalent PF  0.5 mL Intramuscular Tomorrow-1000  . insulin aspart  0-9 Units Subcutaneous TID WC  . lactulose  20 g Per Tube Daily  . mouth rinse  15 mL Mouth Rinse BID  . PHENObarbital  65 mg Intravenous BID  . phenytoin (DILANTIN) IV  100 mg Intravenous Q8H  . polyethylene glycol  17 g Oral Daily  . pyridOXINE  100 mg Intravenous Daily  . senna  1 tablet Oral QHS  . sodium chloride flush  10-40 mL Intracatheter Q12H  . thiamine injection  100 mg Intravenous Daily   Continuous Infusions: . lacosamide (VIMPAT) IV 200 mg (01/22/20 2323)  . levETIRAcetam 1,000 mg (01/23/20 0832)     LOS: 17 days    Time spent: 25 minutes    03/24/20, MD [facility] 01/23/2020, 11:08 AM     Please page though AMION or Epic secure chat:  For 03/24/2020, [employer]

## 2020-01-23 NOTE — Consult Note (Signed)
  Patient unable to complete psychiatric evaluation. Please re-consult once patient is able to participate in evaluation. Psychiatry to sign off.

## 2020-01-23 NOTE — Consult Note (Signed)
Chief Complaint: Patient was seen in consultation today for percutaneous gastrostomy placement.  Referring Physician(s): Dr. Maryfrances Bunnell  Supervising Physician: Gilmer Mor  Patient Status: [facility] - In-pt  History of Present Illness: Anita Davis is a 34 y.o. female with a past medical history significant for substance abuse and hepatitis C who was admitted 01/04/20 with status epilepticus. During her admission she has had persistent encephalopathy, she was treated with several anticonvulsants which improved EEG findings however her mental and functional status have not improved. She has been unable to tolerate PO intake and IR has been consulted of possible percutaneous gastrostomy placement.  Anita Davis is sleeping upon my arrival to her room, she does not arouse to verbal cues but does become agitated when I inspect her abdomen/listen to her chest. No staff or visitors at bedside.  Past Medical History:  Diagnosis Date  . Allergy   . Bronchitis   . Hepatitis-C   . History of gestational diabetes   . HPV in female   . Substance abuse [facility])     Past Surgical History:  Procedure Laterality Date  . HEMORRHOID BANDING  age 89    Allergies: Patient has no known allergies.  Medications: Prior to Admission medications   Medication Sig Start Date End Date Taking? Authorizing Provider  cephALEXin (KEFLEX) 500 MG capsule Take 500 mg by mouth 2 (two) times daily. For 5 days. 12/24/19   [provider]  cetirizine (ZYRTEC) 10 MG tablet Take 10 mg by mouth daily.    [provider]  hydrOXYzine (ATARAX/VISTARIL) 25 MG tablet Take 1-2 tablets (25-50 mg total) by mouth every 6 (six) hours as needed for anxiety. Patient taking differently: Take 75 mg by mouth 5 (five) times daily.  12/31/19   Gilda Crease, MD  ibuprofen (ADVIL) 200 MG tablet Take 200 mg by mouth every 6 (six) hours as needed.    [provider]  METHADONE HCL PO Take 60 mg by mouth daily.      [provider]  ondansetron (ZOFRAN-ODT) 4 MG disintegrating tablet Take 4 mg by mouth 3 (three) times daily as needed for nausea or vomiting. Starting 12/24/2019 x 4 days. 12/24/19   [provider]  Polyethylene Glycol 3350 (MIRALAX PO) Take by mouth.    [provider]     Family History  Problem Relation Age of Onset  . Depression Mother   . Cervical cancer Mother   . Hypertension Father   . Diabetes Father     Social History   Socioeconomic History  . Marital status: Single    Spouse name: Not on file  . Number of children: Not on file  . Years of education: Not on file  . Highest education level: Not on file  Occupational History  . Not on file  Tobacco Use  . Smoking status: Current Every Day Smoker    Years: 24.00    Types: Cigarettes, Cigars  . Smokeless tobacco: Never Used  . Tobacco comment: 1 cigar/daily. former 1 pack cigarette smoker quit 05/2019  currently smokes CBD  Vaping Use  . Vaping Use: Every day  . Substances: Nicotine, Flavoring  Substance and Sexual Activity  . Alcohol use: No  . Drug use: Not Currently    Types: IV, Cocaine, Hydrocodone, Oxycodone, Marijuana, Heroin    Comment: takes methadone- now taking subutex (03/27/19)  . Sexual activity: Not Currently    Birth control/protection: Abstinence  Other Topics Concern  . Not on file  Social History  Narrative  . Not on file   Social Determinants of Health   Financial Resource Strain:   . Difficulty of Paying Living Expenses: Not on file  Food Insecurity:   . Worried About Programme researcher, broadcasting/film/video in the Last Year: Not on file  . Ran Out of Food in the Last Year: Not on file  Transportation Needs:   . Lack of Transportation (Medical): Not on file  . Lack of Transportation (Non-Medical): Not on file  Physical Activity:   . Days of Exercise per Week: Not on file  . Minutes of Exercise per Session: Not on file  Stress:   . Feeling of Stress : Not on file  Social  Connections:   . Frequency of Communication with Friends and Family: Not on file  . Frequency of Social Gatherings with Friends and Family: Not on file  . Attends Religious Services: Not on file  . Active Member of Clubs or Organizations: Not on file  . Attends Banker Meetings: Not on file  . Marital Status: Not on file     Review of Systems: A 12 point ROS discussed and pertinent positives are indicated in the HPI above.  All other systems are negative.  Review of Systems  Unable to perform ROS: Patient nonverbal    Vital Signs: BP (!) 124/91 (BP Location: Right Arm)   Pulse 85   Temp 97.6 F (36.4 C) (Oral)   Resp 12   Ht 5\' 5"  (1.651 m)   Wt 168 lb 14 oz (76.6 kg)   SpO2 97%   BMI 28.10 kg/m   Physical Exam Vitals and nursing note reviewed.  Constitutional:      Comments: Somnolent, agitated when touched. Does not follow commands. Soft restraints/mittens in place.   HENT:     Head: Normocephalic.  Cardiovascular:     Rate and Rhythm: Normal rate and regular rhythm.  Pulmonary:     Effort: Pulmonary effort is normal.  Abdominal:     General: There is distension.     Palpations: Abdomen is soft.     Comments: (+) NG  Skin:    General: Skin is warm and dry.  Neurological:     Mental Status: Mental status is at baseline.      MD Evaluation Airway: Other (comments) Airway comments: Unable to assess adequately - does not follow commands Heart: WNL Abdomen: WNL Chest/ Lungs: WNL ASA  Classification: 3 Mallampati/Airway Score: Two  Imaging: EEG  Result Date: 01/08/2020 Beryle Beams, MD     01/08/2020  3:12 PM [facility] Kofi A. Gerilyn Pilgrim, MD     [email or web address]       HISTORY: This is a 34 year old female who has been in status epilepticus.  This is a repeat EEG to assess treatment as she continues to be encephalopathic. MEDICATIONS: Current Facility-Administered Medications: .  Chlorhexidine Gluconate Cloth 2 % PADS 6 each, 6  each, Topical, Daily, Dahal, Melina Schools, MD, 6 each at 01/08/20 0848 .  enoxaparin (LOVENOX) injection 40 mg, 40 mg, Subcutaneous, Q24H, Adefeso, Oladapo, DO, 40 mg at 01/07/20 2130 .  lacosamide (VIMPAT) 200 mg in sodium chloride 0.9 % 25 mL IVPB, 200 mg, Intravenous, Q12H, Doonquah, Kofi, MD, Last Rate: 90 mL/hr at 01/08/20 1010, 200 mg at 01/08/20 1010 .  levETIRAcetam (KEPPRA) IVPB 1500 mg/ 100 mL premix, 1,500 mg, Intravenous, Q12H, [place]onqu[place], Kofi, MD, Last Rate: 400 mL/hr at 01/08/20 0804, 1,500 mg at 01/08/20 0804 .  LORazepam (ATIVAN) tablet 1 mg, 1  mg, Oral, Q6H PRN **OR** LORazepam (ATIVAN) injection 1 mg, 1 mg, Intramuscular, Q6H PRN, Gerilyn Pilgrim, Kofi, MD .  methylPREDNISolone sodium succinate (SOLU-MEDROL) 1,000 mg in sodium chloride 0.9 % 50 mL IVPB, 1,000 mg, Intravenous, Daily, Beryle Beams, MD, Stopped at 01/07/20 1821 .  mupirocin ointment (BACTROBAN) 2 % 1 application, 1 application, Nasal, BID, Dahal, Binaya, MD, 1 application at 01/08/20 0848 .  pyridOXINE (B-6) injection 100 mg, 100 mg, Intravenous, Daily, [place]onquah, Kofi, MD, 100 mg at 01/08/20 0848 ANALYSIS: A 16 channel recording using standard 10 20 measurements is conducted for 44 minutes.  There is a posterior dominant rhythm of 12 Hz which attenuates with eye opening.  There is significant myogenic and movement artifact seen throughout the recording especially on the right side.  Photic stimulation and hyperventilation are not conducted.  The patient had various episodes of arching of the trunk and movements without electrographic correlates.  There is one episode of generalized delta activity in a semirhythmic pattern.  This was associated with the spindles afterwards and the patient being unresponsive but no clear epileptiform discharges are noted. IMPRESSION: 1.  This study shows an episode of generalized slow-wave delta activity without clear associated epileptiform discharges.  No evidence of epileptiform discharges are noted.  The  recording is significantly improved compared to the 2 previous studies. Kofi A. Gerilyn Pilgrim, M.D. Diplomate, Biomedical engineer of Psychiatry and Neurology ( Neurology).   EEG  Result Date: 01/07/2020 Beryle Beams, MD     01/07/2020 12:50 PM [facility] Kofi A. Gerilyn Pilgrim, MD     [email or web address]       HISTORY: This is a 34 year old female who presents with the altered mental status due to status epilepticus.  This is a repeat EEG to assess improvement. MEDICATIONS: Current Facility-Administered Medications: .  Chlorhexidine Gluconate Cloth 2 % PADS 6 each, 6 each, Topical, Daily, Dahal, Melina Schools, MD, 6 each at 01/07/20 0907 .  enoxaparin (LOVENOX) injection 40 mg, 40 mg, Subcutaneous, Q24H, Adefeso, Oladapo, DO, 40 mg at 01/06/20 2130 .  lacosamide (VIMPAT) 200 mg in sodium chloride 0.9 % 25 mL IVPB, 200 mg, Intravenous, Q12H, Doonquah, Kofi, MD .  levETIRAcetam (KEPPRA) IVPB 1500 mg/ 100 mL premix, 1,500 mg, Intravenous, Q12H, [place]onqu[place], Kofi, MD .  mupirocin ointment (BACTROBAN) 2 % 1 application, 1 application, Nasal, BID, Dahal, Binaya, MD ANALYSIS: A 16 channel recording using standard 10 20 measurements is conducted for 44 minutes.  During the initial part of the study there is evidence of electrographic seizure with generalized 1 Hz second spike slow wave activity.  The maximum seems to involve the left temporal central region.  The activity involves increasing frequency to 1.5-2 Hz.  This is associated with some's generalized slowing.  The patient does have a posterior dominant rhythm seen of 10 Hz bilaterally.  Photic stimulation and hyperventilation are not conducted.  The recording actually seems improved relative to the previous study done yesterday. IMPRESSION: 1.  This abnormal recording showing an electrographic seizure.  The recording has improved however relative to the previous study done yesterday. Kofi A. Gerilyn Pilgrim, M.D. Diplomate, Biomedical engineer of Psychiatry and Neurology ( Neurology).    DG Abd 1 View  Result Date: 01/23/2020 CLINICAL DATA:  Ileus EXAM: ABDOMEN - 1 VIEW COMPARISON:  January 22, 2020 FINDINGS: Persistent diffuse gaseous dilation of colon. Large colonic stool burden. Incomplete visualization of the pubic symphysis. Feeding tube tip terminates over the proximal stomach. Incomplete visualization of the upper abdomen. No acute osseous abnormality. IMPRESSION: 1. Persistent  diffuse gaseous dilation of colon likely reflecting ileus versus underlying dysmotility. Large colonic stool burden. 2.  Feeding tube tip terminates over the proximal stomach. Electronically Signed   By: Meda KlinefelterStephanie  Peacock MD   On: 01/23/2020 10:34   DG Abd 1 View  Result Date: 01/22/2020 CLINICAL DATA:  Abdominal distension. EXAM: ABDOMEN - 1 VIEW COMPARISON:  CT chest, abdomen and pelvis 01/18/2020. FINDINGS: Feeding tube is in place with the tip in the distal stomach. There is a large volume of stool and gaseous distention of the colon. No evidence of free intraperitoneal air on this single view. No acute bony abnormality. IMPRESSION: Findings compatible with ileus. Large colonic stool burden also noted. Feeding tube tip is in the distal stomach. Electronically Signed   By: Drusilla Kannerhomas  Dalessio M.D.   On: 01/22/2020 12:44   CT HEAD WO CONTRAST  Result Date: 01/10/2020 CLINICAL DATA:  Fall with head trauma EXAM: CT HEAD WITHOUT CONTRAST TECHNIQUE: Contiguous axial images were obtained from the base of the skull through the vertex without intravenous contrast. COMPARISON:  None. FINDINGS: Brain: There is no mass, hemorrhage or extra-axial collection. The size and configuration of the ventricles and extra-axial CSF spaces are normal. The brain parenchyma is normal, without acute or chronic infarction. Vascular: No abnormal hyperdensity of the major intracranial arteries or dural venous sinuses. No intracranial atherosclerosis. Skull: The visualized skull base, calvarium and extracranial soft tissues are  normal. Sinuses/Orbits: No fluid levels or advanced mucosal thickening of the visualized paranasal sinuses. No mastoid or middle ear effusion. The orbits are normal. IMPRESSION: Normal head CT. Electronically Signed   By: Deatra RobinsonKevin  Herman M.D.   On: 01/10/2020 21:48   CT CHEST W CONTRAST  Result Date: 01/18/2020 CLINICAL DATA:  Neoplastic syndrome suspected, encephalopathy status epilepticus EXAM: CT CHEST, ABDOMEN, AND PELVIS WITH CONTRAST TECHNIQUE: Multidetector CT imaging of the chest, abdomen and pelvis was performed following the standard protocol during bolus administration of intravenous contrast. CONTRAST:  100mL OMNIPAQUE IOHEXOL 300 MG/ML  SOLN COMPARISON:  January 23, 2017 FINDINGS: CT CHEST FINDINGS Cardiovascular: Heart size is normal. Aortic caliber is normal. Central pulmonary vasculature is normal caliber, limited assessment on venous phase. Mediastinum/Nodes: Thoracic inlet structures without signs of adenopathy. Small lymph node at the LEFT thoracic inlet approximately 5 mm. No axillary lymphadenopathy. No mediastinal or hilar lymphadenopathy. Lungs/Pleura: Lungs are clear.  Airways are patent. Musculoskeletal: See below for full musculoskeletal detail. No chest wall lesion. CT ABDOMEN PELVIS FINDINGS Hepatobiliary: No focal, suspicious hepatic lesion. No portal venous abnormality. No pericholecystic stranding.  No gross biliary duct distension. Pancreas: Pancreas with normal contours. Normal enhancement and without signs of adjacent inflammation. Spleen: Spleen normal in size and contour. Adrenals/Urinary Tract: Adrenal glands are normal. Mild cortical scarring on the RIGHT. No hydronephrosis. No nephrolithiasis on contrasted imaging. Foley catheter collapses the urinary bladder. Small low-density lesions in the LEFT kidney stable since 2018 and likely small cysts Stomach/Bowel: Weighted tip tube in the gastric antrum. Stomach is collapsed limiting assessment without acute process. Normal  appendix. Small bowel without signs of obstruction. Stool throughout the colon.  No pericolonic stranding. Vascular/Lymphatic: Normal caliber abdominal aorta. No signs of vascular disease or atherosclerotic changes. There is no gastrohepatic or hepatoduodenal ligament lymphadenopathy. No retroperitoneal or mesenteric lymphadenopathy. No pelvic sidewall lymphadenopathy. Reproductive: Uterus and adnexa are unremarkable by CT aside from some venous engorgement along the LEFT parametrium. Other: Fat containing umbilical hernia. Musculoskeletal: No acute bone finding. No destructive bone process. IMPRESSION: 1. No acute findings  in the chest, abdomen or pelvis. 2. No signs of neoplasm by CT or metastatic disease. 3. venous engorgement along the LEFT parametrium, which can be seen with pelvic congestion syndrome. Electronically Signed   By: Donzetta Kohut M.D.   On: 01/18/2020 20:59   MR BRAIN WO CONTRAST  Result Date: 01/16/2020 CLINICAL DATA:  Seizure, altered mental status. EXAM: MRI HEAD WITHOUT CONTRAST TECHNIQUE: Multiplanar, multiecho pulse sequences of the brain and surrounding structures were obtained without intravenous contrast. COMPARISON:  01/10/2020 head CT.  01/05/2020 MRI head. FINDINGS: Please note that motion artifact limits evaluation. Brain: No diffusion-weighted signal abnormality. No intracranial hemorrhage. Cerebral volume is within normal limits. No midline shift, ventriculomegaly or extra-axial fluid collection. No mass lesion. Vascular: Grossly preserved major intracranial flow voids. Skull and upper cervical spine: Normal marrow signal. Sinuses/Orbits: Normal orbits. Clear paranasal sinuses. No mastoid effusion. Other: None. IMPRESSION: No acute intracranial process. Motion degradation limits evaluation. Electronically Signed   By: Stana Bunting M.D.   On: 01/16/2020 21:36   MR BRAIN WO CONTRAST  Result Date: 01/05/2020 CLINICAL DATA:  Combative patient.  Mental status changes.  EXAM: MRI HEAD WITHOUT CONTRAST TECHNIQUE: Multiplanar, multiecho pulse sequences of the brain and surrounding structures were obtained without intravenous contrast. COMPARISON:  None. FINDINGS: Brain: Motion degraded study. Axial diffusion imaging only obtained. No sign of acute infarction or other restricted diffusion lesion. No sign of hydrocephalus, mass, hemorrhage or extra-axial collection. Vascular: Major vessels at the base of the brain show flow. Skull and upper cervical spine: No abnormality seen. Sinuses/Orbits: No abnormality seen. Other: None IMPRESSION: Motion degraded study. Axial diffusion imaging only obtained. No abnormality seen to explain the clinical presentation. Electronically Signed   By: Paulina Fusi M.D.   On: 01/05/2020 11:24   MR BRAIN W CONTRAST  Result Date: 01/19/2020 CLINICAL DATA:  Neoplastic workup. Status epilepticus. Polysubstance abuse. EXAM: MRI HEAD WITH CONTRAST TECHNIQUE: Multiplanar, multiecho pulse sequences of the brain and surrounding structures were obtained with intravenous contrast. CONTRAST:  7.61mL GADAVIST GADOBUTROL 1 MMOL/ML IV SOLN COMPARISON:  MRI head without contrast 01/16/2020 FINDINGS: The patient not able to cooperate and hold still. Attempted repeat axial and coronal T2 images were performed and do not reveal any evidence of acute abnormality. Diffusion not performed. Postcontrast imaging degraded by motion but does not reveal any abnormal enhancement. No enhancing mass or fluid collection. IMPRESSION: Exam quality limited by motion. No acute abnormality.  Normal enhancement pattern. Electronically Signed   By: Marlan Palau M.D.   On: 01/19/2020 10:19   CT ABDOMEN PELVIS W CONTRAST  Result Date: 01/18/2020 CLINICAL DATA:  Neoplastic syndrome suspected, encephalopathy status epilepticus EXAM: CT CHEST, ABDOMEN, AND PELVIS WITH CONTRAST TECHNIQUE: Multidetector CT imaging of the chest, abdomen and pelvis was performed following the standard protocol  during bolus administration of intravenous contrast. CONTRAST:  OMNIPAQUE IOHEXOL 300 MG/ML  SOLN COMPARISON:  January 23, 2017 FINDINGS: CT CHEST FINDINGS Cardiovascular: Heart size is normal. Aortic caliber is normal. Central pulmonary vasculature is normal caliber, limited assessment on venous phase. Mediastinum/Nodes: Thoracic inlet structures without signs of adenopathy. Small lymph node at the LEFT thoracic inlet approximately 5 mm. No axillary lymphadenopathy. No mediastinal or hilar lymphadenopathy. Lungs/Pleura: Lungs are clear.  Airways are patent. Musculoskeletal: See below for full musculoskeletal detail. No chest wall lesion. CT ABDOMEN PELVIS FINDINGS Hepatobiliary: No focal, suspicious hepatic lesion. No portal venous abnormality. No pericholecystic stranding.  No gross biliary duct distension. Pancreas: Pancreas with normal contours. Normal enhancement  and without signs of adjacent inflammation. Spleen: Spleen normal in size and contour. Adrenals/Urinary Tract: Adrenal glands are normal. Mild cortical scarring on the RIGHT. No hydronephrosis. No nephrolithiasis on contrasted imaging. Foley catheter collapses the urinary bladder. Small low-density lesions in the LEFT kidney stable since 2018 and likely small cysts Stomach/Bowel: Weighted tip tube in the gastric antrum. Stomach is collapsed limiting assessment without acute process. Normal appendix. Small bowel without signs of obstruction. Stool throughout the colon.  No pericolonic stranding. Vascular/Lymphatic: Normal caliber abdominal aorta. No signs of vascular disease or atherosclerotic changes. There is no gastrohepatic or hepatoduodenal ligament lymphadenopathy. No retroperitoneal or mesenteric lymphadenopathy. No pelvic sidewall lymphadenopathy. Reproductive: Uterus and adnexa are unremarkable by CT aside from some venous engorgement along the LEFT parametrium. Other: Fat containing umbilical hernia. Musculoskeletal: No acute bone  finding. No destructive bone process. IMPRESSION: 1. No acute findings in the chest, abdomen or pelvis. 2. No signs of neoplasm by CT or metastatic disease. 3. venous engorgement along the LEFT parametrium, which can be seen with pelvic congestion syndrome. Electronically Signed   By: Donzetta Kohut M.D.   On: 01/18/2020 20:59   DG CHEST PORT 1 VIEW  Result Date: 01/22/2020 CLINICAL DATA:  Abdominal distension. EXAM: PORTABLE CHEST 1 VIEW COMPARISON:  Single-view of the chest 01/19/2020. FINDINGS: Lungs clear. Heart size normal. No pneumothorax or pleural fluid. Feeding tube courses into the stomach and below the inferior margin the film. IMPRESSION: No acute disease. Electronically Signed   By: Drusilla Kanner M.D.   On: 01/22/2020 12:45   DG CHEST PORT 1 VIEW  Result Date: 01/19/2020 CLINICAL DATA:  PICC placement. EXAM: PORTABLE CHEST 1 VIEW COMPARISON:  Chest radiographs 06/18/2019 and CT 01/18/2020 FINDINGS: A left PICC terminates near the superior cavoatrial junction. An enteric tube courses into the upper abdomen with tip not imaged. The cardiomediastinal silhouette is within normal limits. No confluent airspace opacity, edema, pleural effusion, or pneumothorax is identified. No acute osseous abnormality is seen. IMPRESSION: Left PICC terminates near the superior cavoatrial junction. Electronically Signed   By: Sebastian Ache M.D.   On: 01/19/2020 19:10   EEG adult  Result Date: 01/06/2020 Charlsie Quest, MD     01/06/2020  4:56 PM Patient Name: Anita Davis MRN: [id number] Epilepsy Attending: Charlsie Quest Referring Physician/Provider: Dr Westley Foots Date: 01/06/2020 Duration: 26.15 mins Patient history: 34 y.o.femalewith medical history significant forhepatitis C and polysubstance abuse who was brought to the ED by EMS for altered mental status. EEG to evaluate for seizure. Level of alertness: Awake AEDs during EEG study: None Technical aspects: This EEG study was done with scalp  electrodes positioned according to the 10-20 International system of electrode placement. Electrical activity was acquired at a sampling rate of 500Hz  and reviewed with a high frequency filter of 70Hz  and a low frequency filter of 1Hz . EEG data were recorded continuously and digitally stored. Description: No posterior dominant rhythm was seen. EEG showed frequent seizures without clinical signs arising from left centrotemporal region. EEG showed 5-6hz  spike and wave as well as theta slowing in left centrotemporal region which then spread to involve all left hemisphere followed by right hemisphere and evolved to 1-2hz  delta activity.  Generalized 2-3hz  delta slowing with overriding15-18hz  beta activity was also noted. Hyperventilation and photic stimulation were not performed.   ABNORMALITY - Focal non convulsive status epilepticus, left centro-temporal region - Continuous slow, generalized - Excessive beta, generalized IMPRESSION: This study showed focal non convulsive status epilepticus arising from  left centrotemporal region. Additionally, there is severe diffuse encephalopathy, non specific etiology but likely related to seizures. Dr Pola Corn and Dr Gerilyn Pilgrim were notified. Priyanka [name]   Overnight EEG with video  Result Date: 01/13/2020 Charlsie Quest, MD     01/14/2020  9:59 AM Patient Name:Anita Davis OZH:[id number] Epilepsy Attending:Priyanka [name] Referring Physician/Provider:Dr Brooke Dare Duration:01/12/2020 1656 to [id number]/[id number]  Patient history:34 y.o.femalewith medical history significant forhepatitis C and polysubstance abuse whowas brought to the ED by EMS for altered mental statusand found to be in status.EEG to evaluate for seizure.  Level of alertness:Awake, asleep  AEDs during EEG study:LEV, Vmpat, PHT, Phenobarb  Technical aspects: This EEG study was done with scalp electrodes positioned according to the 10-20 International system of electrode placement.  Electrical activity was acquired at a sampling rate of 500Hz  and reviewed with a high frequency filter of 70Hz  and a low frequency filter of 1Hz . EEG data were recorded continuously and digitally stored.  Description: The posterior dominant rhythm consists of10Hz  activity of moderate voltage (25-35 uV) seen predominantly in posterior head regions, symmetric and reactive to eye opening and eye closing. Sleep was characterized by vertex waves, sleep spindles (12 to 14 Hz), maximal frontocentral region. Sharp waves in left centrotemporal region were noted, a times in runs mostly during sleep. EEG also showed intermittent rhythmic 3-6hz  theta-delta slowing in left centrotemporal regionHyperventilation and photic stimulation were not performed.   ABNORMALITY - Sharp waves, left centro-temporal region - Intermittent rhythmic slow, left centro-temporal region  IMPRESSION: This study showed evidence ofepileptogenicity as well as cortical dysfunction arising from left centrotemporal region.Lateralized rhythmic slowing in left centro-temporal region  Is on the ictal-interictal continuum with low potential for seizures. No definite seizures were seen during this study.   Priyanka   Overnight EEG with video  Result Date: 01/10/2020 , MD     01/11/2020  8:08 AM //Patient Name: Anita Davis MRN: [facility] Epilepsy Attending: 01/13/2020   Referring Physician/Provider: Dr [name] Duration: 01/09/2020 1643 to 01/10/2020 1643 Patient history: 34 y.o.femalewith medical history significant forhepatitis C and polysubstance abuse whowas brought to the ED by EMS for altered mental status and found to be in status. EEG to evaluate for seizure. Level of alertness: Awake, asleep AEDs during EEG study: LEV, Vmpat, PHT Technical aspects: This EEG study was done with scalp electrodes positioned according to the 10-20 International system of electrode placement. Electrical  activity was acquired at a sampling rate of 500Hz  and reviewed with a high frequency filter of 70Hz  and a low frequency filter of 1Hz . EEG data were recorded continuously and digitally stored. Description: The posterior dominant rhythm consists of  10 Hz activity of moderate voltage (25-35 uV) seen predominantly in posterior head regions, symmetric and reactive to eye opening and eye closing. Sleep was characterized by vertex waves, sleep spindles (12 to 14 Hz), maximal frontocentral region.  EEG showed intermittent rhythmic 2-3hz  sharply contoured delta activity in left hemisphere which at times shows evolution in amplitude, lasting 2-7 seconds. At times quasi PLEd-like sharp waves in left centrotemporal region were also noted.  Hyperventilation and photic stimulation were not performed.   ABNORMALITY - Sharp waves, left centro-temporal region - Intermittent rhythmic delta activity, lateralized left hemisphere ( LRDA) IMPRESSION: This study showed evidence of epileptogenicity arising from left centrotemporal region as well as cortical dysfunction in left hemisphere. LRDA is on the ictal-interictal continuum and due to evolution in amplitude and sharply contoured morphology, this could  be ictal in nature. No definite seizures were seen during this study. EEG appears to have improved compared to prior eegs. Charlsie Quest   [place] EKG SITE RITE  Result Date: 01/19/2020 If [facility] image not attached, placement could not be confirmed due to current cardiac rhythm.  US Abdomen Limited RUQ  Result Date: 01/14/2020 CLINICAL DATA:  Elevated liver enzymes EXAM: ULTRASOUND ABDOMEN LIMITED RIGHT UPPER QUADRANT COMPARISON:  January 24, 2017 FINDINGS: Gallbladder: No gallstones or wall thickening visualized. No pericholecystic fluid. No sonographic Murphy sign noted by sonographer. Note that only supine imaging could be achieved due to restrictions regarding patient motion. Common bile duct: Diameter: 5 mm. No  intrahepatic or extrahepatic biliary duct dilatation. Liver: No focal lesion identified. Liver echogenicity overall mildly increased. Portal vein is patent on color Doppler imaging with normal direction of blood flow towards the liver. Other: None. IMPRESSION: Mild increase in liver echogenicity may represent a degree of underlying hepatic steatosis. No focal liver lesions evident. No gallbladder pathology evident. Note that only supine imaging could be achieved due to limitations with respect patient motion for repositioning. Electronically Signed   By: Bretta Bang III M.D.   On: 01/14/2020 11:05    Labs:  CBC: Recent Labs    01/16/20 0601 01/17/20 0503 01/18/20 0318 01/19/20 0348  WBC 2.7* 4.1 3.7* 3.6*  HGB 13.8 14.2 13.1 12.6  HCT 39.4 41.0 38.8 37.9  PLT 151 169 120* 149*    COAGS: Recent Labs    01/06/20 0610  INR 1.1  APTT 32    BMP: Recent Labs    01/16/20 0601 01/17/20 0503 01/18/20 0318 01/19/20 0348  NA 139 139 140 137  K 3.2* 3.8 4.2 3.8  CL 104 102 105 102  CO2 26 26 27 26   GLUCOSE 189* 146* 178* 220*  BUN 13 12 9 11   CALCIUM 8.7* 8.7* 8.9 9.0  CREATININE 0.65 0.67 0.72 0.71  GFRNONAA >60 >60 >60 >60  GFRAA >60 >60 >60 >60    LIVER FUNCTION TESTS: Recent Labs    01/16/20 0601 01/17/20 0503 01/18/20 0318 01/19/20 0348  BILITOT 0.8 0.7 0.8 0.7  AST 277* 135* 117* 126*  ALT 629* 472* 350* 326*  ALKPHOS 66 74 67 81  PROT 5.8* 5.9* 5.6* 6.1*  ALBUMIN 3.2* 3.1* 3.0* 3.0*    TUMOR MARKERS: No results for input(s): AFPTM, CEA, CA199, CHROMGRNA in the last 8760 hours.  Assessment and Plan:  34 y/o F with persistent encephalopathy of unknown etiology seen today for possible percutaneous gastrostomy placement in IR.  Patient history and imaging reviewed by Dr. 01/21/20 - she is likely a candidate for percutaneous g-tube placement based on imaging, however she will require a barium enema during the procedure and given the degree of fecal  impaction on yesterday and today's KUB we will be unable to move forward until this is resolved. I discussed this with Dr. 31 who will work on disimpaction. At this point we will tentatively plan for placement on Tuesday 10/5, pending resolution of impaction.  We will discuss procedure/obtain consent from responsible party early next week.  Please call with questions or concerns.   Thank you for this interesting consult.  I greatly enjoyed meeting Anita Davis and look forward to participating in their care.  A copy of this report was sent to the requesting provider on this date.  Electronically Signed: Friday, PA-C 01/23/2020, 2:20 PM   I spent a total of 40 Minutes n  face to face in clinical consultation, greater than 50% of which was counseling/coordinating care for gastrostomy placement.

## 2020-01-24 LAB — GLUCOSE, CAPILLARY
Glucose-Capillary: 107 mg/dL — ABNORMAL HIGH (ref 70–99)
Glucose-Capillary: 110 mg/dL — ABNORMAL HIGH (ref 70–99)
Glucose-Capillary: 120 mg/dL — ABNORMAL HIGH (ref 70–99)
Glucose-Capillary: 124 mg/dL — ABNORMAL HIGH (ref 70–99)
Glucose-Capillary: 92 mg/dL (ref 70–99)
Glucose-Capillary: 94 mg/dL (ref 70–99)

## 2020-01-24 MED ORDER — OSMOLITE 1.2 CAL PO LIQD
1000.0000 mL | ORAL | Status: AC
  Administered 2020-01-24 – 2020-01-28 (×5): 1000 mL
  Filled 2020-01-24 (×4): qty 1000

## 2020-01-24 MED ORDER — PROSOURCE TF PO LIQD
45.0000 mL | Freq: Every day | ORAL | Status: DC
  Administered 2020-01-24 – 2020-02-04 (×11): 45 mL
  Filled 2020-01-24 (×12): qty 45

## 2020-01-24 NOTE — Progress Notes (Signed)
[facility] PROGRESS NOTE    Anita Davis  GGY:[id number] DOB: Sep 04, 1985 DOA: 01/04/2020 PCP: [name], PA-C      Brief Narrative:  Anita Davis is a 34 y.o. F with hx substance use disorder, active, hepatitis C, and gestational diabetes who presented with confusion.  Patient initially admitted in September with encephalopathy, thought to be from drug overdose.  LP showed no signs of infection. MRI brain unremarkable. She remained encephalopathic, and EEG showed focal seizures.  The seizures were suppressed, and she was transferred to [facility] for continuous EEG, but after seizure control, she had poor neurologic recovery.  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  She deteriorated after steroids were stopped, so IVIG was started.       Assessment & Plan:  Acute now persistent metabolic encephalopathy Patient was admitted with acute metabolic encephalopathy, which has now persisted for several weeks.    The specific etiology remains unclear, but the differential for this appears to be an autoimmune encephalitis, or post status epilepticus encephalopathy.  TSH normal.  Neuraxial imaging unremarkable.  CT C/A/P shows no signs of cancer, paraneoplastic encephalitis seems unlikely.  The clinical picture is not consistent with psychiatric catatonia, nor an opiate/alcohol withdrawal syndrome at this point.     Completed IVIG 10/1.  In last few days, has seemed more active, though still not interactive or attentive. -Continue phenobarbital, Vimpat, Keppra -Avoid sedatives -PEG placement on Tuesday hopefully      Fever 9/30 No fever since.  Ileus probably due to constipation Several BM overnight.  Abdomen soft. -Resume tube feeds -Continue bowel regimen -Repeat X-ray tomorrow         Disposition: Status is: Inpatient  Remains inpatient appropriate because:IV treatments  appropriate due to intensity of illness or inability to take PO   Dispo: The patient is from: Home              Anticipated d/c is to: SNF              Anticipated d/c date is: > 3 days              Patient currently is not medically stable to d/c.    The patient was admitted with a severe encephalopathy, which is not improved despite burst suppression and resolution of status epilepticus, high-dose steroids for autoimmune encephalitis.  She has completed IVIG.  We will place PEG, proceed towards SNF placement          MDM: The below labs and imaging reports were reviewed and summarized above.  Medication management as above.      DVT prophylaxis: enoxaparin (LOVENOX) injection 40 mg Start: 01/05/20 2230 SCDs Start: 01/05/20 2217  Code Status: Full code Family Communication:             Subjective: Belly soft.  No seizurelike activity.  No fever.  No vomiting, no cough, no respiratory distress.         Objective Vitals:   01/23/20 2353 01/24/20 0315 01/24/20 0847 01/24/20 1337  BP: 128/89 123/80  137/78  Pulse:  88 99 86  Resp: 20  20 18   Temp: 99.5 F (37.5 C) 99.4 F (37.4 C) 98.5 F (36.9 C) 98.4 F (36.9 C)  TempSrc: Oral Axillary Oral Oral  SpO2: 98% 97% 97% 100%  Weight:      Height:        Intake/Output Summary (Last 24 hours) at 01/24/2020 1525  Last data filed at 01/24/2020 0445 Gross per 24 hour  Intake 120 ml  Output 1550 ml  Net -1430 ml   Filed Weights   01/17/20 0350 01/19/20 0358 01/21/20 0327  Weight: 74.3 kg 76.1 kg 76.6 kg    Examination: General appearance: Adult female, lying in bed, restless, makes eye contact briefly, but no intentional movements, no verbalizations  HEENT:    Anicteric, conjunctive pink, lids and lashes normal.  No nasal deformity, discharge, or epistaxis  NG tube in place Skin: No change to hyperpigmented marks on arms and legs.  Right forearm is imrpvoing  Cardiac: RRR, no murmurs, mild nonfocal  pitting edema Respiratory: Normal respiratory rate and rhythm, lungs clear without rales or wheezes Abdomen: Abdomen soft, no grimace to palpation, no distension.  No rigidity or rebound.  No guarding. MSK: No deformities or effusions. Neuro: She is restless, sitting up, but makes no spontaneous verbalizations, no intelligible verbalizations, no obviously purposeful movements or following commands.   Data Reviewed: I have personally reviewed following labs and imaging studies:  CBC: Recent Labs  Lab 01/18/20 0318 01/19/20 0348  WBC 3.7* 3.6*  HGB 13.1 12.6  HCT 38.8 37.9  MCV 89.4 90.0  PLT 120* 149*   Basic Metabolic Panel: Recent Labs  Lab 01/18/20 0318 01/19/20 0348  NA 140 137  K 4.2 3.8  CL 105 102  CO2 27 26  GLUCOSE 178* 220*  BUN 9 11  CREATININE 0.72 0.71  CALCIUM 8.9 9.0   GFR: Estimated Creatinine Clearance: 99.5 mL/min (by C-G formula based on SCr of 0.71 mg/dL). Liver Function Tests: Recent Labs  Lab 01/18/20 0318 01/19/20 0348  AST 117* 126*  ALT 350* 326*  ALKPHOS 67 81  BILITOT 0.8 0.7  PROT 5.6* 6.1*  ALBUMIN 3.0* 3.0*   No results for input(s): LIPASE, AMYLASE in the last 168 hours. No results for input(s): AMMONIA in the last 168 hours. Coagulation Profile: No results for input(s): INR, PROTIME in the last 168 hours. Cardiac Enzymes: No results for input(s): CKTOTAL, CKMB, CKMBINDEX, TROPONINI in the last 168 hours. BNP (last 3 results) No results for input(s): PROBNP in the last 8760 hours. HbA1C: No results for input(s): HGBA1C in the last 72 hours. CBG: Recent Labs  Lab 01/23/20 2030 01/24/20 0018 01/24/20 0410 01/24/20 0825 01/24/20 1336  GLUCAP 100* 124* 120* 107* 94   Lipid Profile: No results for input(s): CHOL, HDL, LDLCALC, TRIG, CHOLHDL, LDLDIRECT in the last 72 hours. Thyroid Function Tests: No results for input(s): TSH, T4TOTAL, FREET4, T3FREE, THYROIDAB in the last 72 hours. Anemia Panel: No results for input(s):  VITAMINB12, FOLATE, FERRITIN, TIBC, IRON, RETICCTPCT in the last 72 hours. Urine analysis:    Component Value Date/Time   COLORURINE YELLOW 01/09/2020 1356   APPEARANCEUR CLOUDY (A) 01/09/2020 1356   LABSPEC 1.018 01/09/2020 1356   PHURINE 8.0 01/09/2020 1356   GLUCOSEU NEGATIVE 01/09/2020 1356   HGBUR NEGATIVE 01/09/2020 1356   BILIRUBINUR NEGATIVE 01/09/2020 1356   KETONESUR 20 (A) 01/09/2020 1356   PROTEINUR NEGATIVE 01/09/2020 1356   NITRITE NEGATIVE 01/09/2020 1356   LEUKOCYTESUR NEGATIVE 01/09/2020 1356   Sepsis Labs: @LABRCNTIP (procalcitonin:4,lacticacidven:4)  )No results found for this or any previous visit (from the past 240 hour(s)).       Radiology Studies: DG Abd 1 View  Result Date: 01/23/2020 CLINICAL DATA:  Ileus EXAM: ABDOMEN - 1 VIEW COMPARISON:  January 22, 2020 FINDINGS: Persistent diffuse gaseous dilation of colon. Large colonic stool burden. Incomplete visualization of  the pubic symphysis. Feeding tube tip terminates over the proximal stomach. Incomplete visualization of the upper abdomen. No acute osseous abnormality. IMPRESSION: 1. Persistent diffuse gaseous dilation of colon likely reflecting ileus versus underlying dysmotility. Large colonic stool burden. 2.  Feeding tube tip terminates over the proximal stomach. Electronically Signed   By: Meda Klinefelter MD   On: 01/23/2020 10:34        Scheduled Meds: . Chlorhexidine Gluconate Cloth  6 each Topical Daily  . enoxaparin (LOVENOX) injection  40 mg Subcutaneous Q24H  . famotidine  20 mg Oral Daily  . influenza vac split quadrivalent PF  0.5 mL Intramuscular Tomorrow-1000  . insulin aspart  0-9 Units Subcutaneous TID WC  . lactulose  20 g Per Tube BID  . mouth rinse  15 mL Mouth Rinse BID  . PHENObarbital  65 mg Intravenous BID  . phenytoin (DILANTIN) IV  100 mg Intravenous Q8H  . polyethylene glycol  17 g Oral Daily  . pyridOXINE  100 mg Intravenous Daily  . senna  1 tablet Oral QHS  . sodium  chloride flush  10-40 mL Intracatheter Q12H  . thiamine injection  100 mg Intravenous Daily   Continuous Infusions: . lacosamide (VIMPAT) IV 200 mg (01/24/20 0953)  . levETIRAcetam 1,000 mg (01/24/20 0724)     LOS: 18 days    Time spent: 25 minutes    Alberteen Sam, MD [facility] 01/24/2020, 3:25 PM     Please page though AMION or Epic secure chat:  For [employer], Higher education careers adviser

## 2020-01-25 ENCOUNTER — Inpatient Hospital Stay (HOSPITAL_COMMUNITY): Payer: Medicaid Other

## 2020-01-25 LAB — COMPREHENSIVE METABOLIC PANEL
ALT: 341 U/L — ABNORMAL HIGH (ref 0–44)
AST: 276 U/L — ABNORMAL HIGH (ref 15–41)
Albumin: 2.6 g/dL — ABNORMAL LOW (ref 3.5–5.0)
Alkaline Phosphatase: 75 U/L (ref 38–126)
Anion gap: 7 (ref 5–15)
BUN: 11 mg/dL (ref 6–20)
CO2: 28 mmol/L (ref 22–32)
Calcium: 8.7 mg/dL — ABNORMAL LOW (ref 8.9–10.3)
Chloride: 103 mmol/L (ref 98–111)
Creatinine, Ser: 0.62 mg/dL (ref 0.44–1.00)
GFR calc Af Amer: >60 mL/min (ref >60)
GFR calc non Af Amer: >60 mL/min (ref >60)
Glucose, Bld: 136 mg/dL — ABNORMAL HIGH (ref 70–99)
Potassium: 3.3 mmol/L — ABNORMAL LOW (ref 3.5–5.1)
Sodium: 138 mmol/L (ref 135–145)
Total Bilirubin: 0.6 mg/dL (ref 0.3–1.2)
Total Protein: 8.2 g/dL — ABNORMAL HIGH (ref 6.5–8.1)

## 2020-01-25 LAB — CBC
HCT: 34.4 % — ABNORMAL LOW (ref 36.0–46.0)
Hemoglobin: 11.8 g/dL — ABNORMAL LOW (ref 12.0–15.0)
MCH: 31.5 pg (ref 26.0–34.0)
MCHC: 34.3 g/dL (ref 30.0–36.0)
MCV: 91.7 fL (ref 80.0–100.0)
Platelets: 232 10*3/uL (ref 150–400)
RBC: 3.75 MIL/uL — ABNORMAL LOW (ref 3.87–5.11)
RDW: 12.3 % (ref 11.5–15.5)
WBC: 3.6 10*3/uL — ABNORMAL LOW (ref 4.0–10.5)
nRBC: 0 % (ref 0.0–0.2)

## 2020-01-25 LAB — GLUCOSE, CAPILLARY
Glucose-Capillary: 130 mg/dL — ABNORMAL HIGH (ref 70–99)
Glucose-Capillary: 136 mg/dL — ABNORMAL HIGH (ref 70–99)
Glucose-Capillary: 141 mg/dL — ABNORMAL HIGH (ref 70–99)
Glucose-Capillary: 143 mg/dL — ABNORMAL HIGH (ref 70–99)
Glucose-Capillary: 151 mg/dL — ABNORMAL HIGH (ref 70–99)
Glucose-Capillary: 155 mg/dL — ABNORMAL HIGH (ref 70–99)
Glucose-Capillary: 159 mg/dL — ABNORMAL HIGH (ref 70–99)

## 2020-01-25 MED ORDER — PHENOBARBITAL 60 MG/ML ORAL SUSPENSION: 60.0000 mg | Freq: Two times a day (BID) | ORAL | Status: DC

## 2020-01-25 MED ORDER — LEVETIRACETAM 100 MG/ML PO SOLN
1000.0000 mg | Freq: Two times a day (BID) | ORAL | Status: DC
  Administered 2020-01-25 – 2020-01-28 (×7): 1000 mg
  Filled 2020-01-25 (×7): qty 10

## 2020-01-25 MED ORDER — LACTULOSE 10 GM/15ML PO SOLN
20.0000 g | Freq: Every day | ORAL | Status: DC
  Administered 2020-01-25 – 2020-02-07 (×13): 20 g
  Filled 2020-01-25 (×13): qty 30

## 2020-01-25 MED ORDER — POTASSIUM CHLORIDE 20 MEQ PO PACK
40.0000 meq | PACK | Freq: Once | ORAL | Status: AC
  Administered 2020-01-25: 40 meq via ORAL
  Filled 2020-01-25: qty 2

## 2020-01-25 MED ORDER — VITAMIN B-6 100 MG PO TABS
100.0000 mg | ORAL_TABLET | Freq: Every day | ORAL | Status: DC
  Administered 2020-01-25 – 2020-02-08 (×12): 100 mg
  Filled 2020-01-25 (×15): qty 1

## 2020-01-25 MED ORDER — PHENYTOIN 125 MG/5ML PO SUSP
100.0000 mg | Freq: Three times a day (TID) | ORAL | Status: DC
  Administered 2020-01-25 – 2020-01-26 (×6): 100 mg
  Filled 2020-01-25 (×7): qty 4

## 2020-01-25 MED ORDER — LACOSAMIDE 10 MG/ML PO SOLN: 200.0000 mg | Freq: Two times a day (BID) | ORAL | Status: DC

## 2020-01-25 MED ORDER — PHENOBARBITAL 20 MG/5ML PO ELIX
60.0000 mg | ORAL_SOLUTION | Freq: Two times a day (BID) | ORAL | Status: DC
  Administered 2020-01-25 (×2): 60 mg via ORAL
  Filled 2020-01-25 (×3): qty 15

## 2020-01-25 MED ORDER — LACOSAMIDE 200 MG PO TABS
200.0000 mg | ORAL_TABLET | Freq: Two times a day (BID) | ORAL | Status: DC
  Administered 2020-01-25 – 2020-02-08 (×27): 200 mg
  Filled 2020-01-25 (×28): qty 1

## 2020-01-25 NOTE — Progress Notes (Addendum)
[facility] PROGRESS NOTE  Anita Davis ENI:[id number] DOB: 1986-02-17 DOA: 01/04/2020 PCP: [name], PA-C  Status: Inpatient-Remains inpatient appropriate because:Altered mental status, Ongoing diagnostic testing needed not appropriate for outpatient work up and Unsafe d/c plan -plan for PEG tube placement on 10/5   Dispo: The patient is from: Home              Anticipated d/c is to: SNF              Anticipated d/c date is: > 3 days              Patient currently is not medically stable to d/c.  Due to UNSAFE DC PLAN-patient needs placement in skilled nursing facility based on altered mentation and inability to manage self-care.  She currently requires 24/7 care due to delay from presumptive diagnosis of autoimmune encephalitis.  She is currently bedbound and will undergo PEG tube placement tentatively on 10/5.  Patient does have a dysphagia 1 diet ordered but given her variable mentation she is inconsistently and unsafely able to manage oral intake.  She will need close outpatient follow-up with neurology to determine if any additional medications and or other treatments are indicated.  Patient is self-pay financial counseling aware that patient needs work-up for Medicaid and disability.  She may also need to have a family member established as her legal guardian.   Code Status: Full Family Communication: No family at bedside at time of my evaluation.  Patient has 2 aunts listed as contacts.  I have also been informed her father has been involved in her care but there is not a number on the chart to contact him. DVT prophylaxis: Lovenox Vaccination status: Has not received Covid vaccine.  HPI: 34 y.o. F with hx substance use disorder, active, hepatitis C, and gestational diabetes who presented with confusion.  Patient initially admitted in September with encephalopathy, thought to be from drug overdose.  LP showed no signs of infection. MRI brain unremarkable. She remained  encephalopathic, and EEG showed focal seizures.  The seizures were suppressed, and she was transferred to [facility] for continuous EEG, but after seizure control, she had poor neurologic recovery.  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  She deteriorated after steroids were stopped, so IVIG was started.  Subjective: Arrived to room to find patient laying in bed with eyes open.  With combination of verbal and tactile stimulation patient briefly made eye contact but eyes remained fixated on side rail noting she appears to be looking at the head of bed leveling guide.  No spontaneous extremity movement detected.  No family at bedside.  Objective: Vitals:   01/25/20 0429 01/25/20 0807  BP: 138/85 126/83  Pulse: 72 83  Resp: 17 18  Temp: 97.6 F (36.4 C) 98.5 F (36.9 C)  SpO2: 99% 99%    Intake/Output Summary (Last 24 hours) at 01/25/2020 1057 Last data filed at 01/25/2020 0500 Gross per 24 hour  Intake 465 ml  Output 1050 ml  Net -585 ml   Filed Weights   01/19/20 0358 01/21/20 0327 01/25/20 0500  Weight: 76.1 kg 76.6 kg 73.9 kg    Exam:  Constitutional: NAD, calm, appears to be comfortable Respiratory: clear to auscultation bilaterally, no wheezing, no crackles. Normal respiratory effort. No accessory muscle use.  Room air Cardiovascular: Regular rate and rhythm, no murmurs / rubs / gallops. No extremity edema. 2+ pedal pulses.   Abdomen: no tenderness,  no masses palpated. No hepatosplenomegaly. Bowel sounds positive.  Small bore feeding tube per right nare with tube feeding infusing Musculoskeletal: no clubbing / cyanosis. No joint deformity upper and lower extremities.no contractures.  No tremors or spasticity noted with passive range of motion Skin: no rashes, lesions, ulcers. No induration Neurologic: CN 2-12 appear to be grossly intact based on visual inspection alone. Sensation appears to be intact  given response to tactile stimulation, unable to test strength given patient's inability to participate or follow simple commands.  Again no spasticity or tremors elicited with passive range of motion Psychiatric: Alert noting eyes open and patient briefly made eye contact when spoken to.  Patient currently nonverbal therefore unable to adequately assess mentation or mood.  Affect appears to be flat.   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   -The specific etiology remains unclear, but the differential for this appears to be an autoimmune encephalitis, or post status epilepticus encephalopathy.  TSH normal.  Neuraxial imaging unremarkable.  CT C/A/P shows no signs of cancer, paraneoplastic encephalitis seems unlikely.  The clinical picture is not consistent with a true psychiatric mediated catatonia, nor an opiate/alcohol withdrawal syndrome at this point (see below regarding psychiatry recommendations).    -Completed IVIG 10/1.  In last few days, has seemed more active, though still not interactive or attentive.  Upon review of neurology notes they have documented that it may take several weeks before full effects/potential improvement from IVIG seen -Continue phenobarbital, Vimpat, Keppra and Dilantin -Evaluated by psychiatry on 9/29-they suspected she may be experiencing a degree of withdrawal physiology therefore they added Valium 2 mg per tube twice daily this has subsequently been discontinued.  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  Abdomen soft. -Resume tube feeds -Continue bowel regimen (continue Senokot and scheduled MiraLAX)-lactulose added recently and with increased bowel movements- have decreased to once daily at bedtime -Repeat X-ray tomorrow  Nutrition Status/moderate to severe protein calorie malnutrition: Nutrition Problem: Inadequate oral intake Etiology: lethargy/confusion Signs/Symptoms: meal completion < 25% Interventions: Tube feeding, Prostat Estimated body mass index is 27.11 kg/m as calculated from the following:   Height as of this encounter: 5\' 5"  (1.651 m).   Weight as of this encounter: 73.9 kg.    Data Reviewed: Basic Metabolic Panel: Recent Labs  Lab 01/19/20 0348 01/25/20 0308  NA 137 138  K 3.8 3.3*  CL 102 103  CO2 26 28  GLUCOSE 220* 136*  BUN 11 11  CREATININE 0.71 0.62  CALCIUM 9.0 8.7*   Liver Function Tests: Recent Labs  Lab 01/19/20 0348 01/25/20 0308  AST 126* 276*  ALT 326* 341*  ALKPHOS 81 75  BILITOT 0.7 0.6  PROT 6.1* 8.2*  ALBUMIN 3.0* 2.6*   No results for input(s): LIPASE, AMYLASE in the last 168 hours. No results for input(s): AMMONIA in the last 168 hours. CBC: Recent Labs  Lab 01/19/20 0348 01/25/20 0308  WBC 3.6* 3.6*  HGB 12.6 11.8*  HCT 37.9 34.4*  MCV 90.0 91.7  PLT 149* 232   Cardiac Enzymes: No results for input(s): CKTOTAL, CKMB, CKMBINDEX, TROPONINI in the last 168 hours. BNP (last  3 results) No results for input(s): BNP in the last 8760 hours.  ProBNP (last 3 results) No results for input(s): PROBNP in the last 8760 hours.  CBG: Recent Labs  Lab 01/24/20 1629 01/24/20 2019 01/25/20 0006 01/25/20 0437 01/25/20 0812  GLUCAP 92 110* 136* 130* 143*    No results found for this or any previous visit (from the past 240 hour(s)).   Studies: DG Abd Portable 1V  Result Date: 01/25/2020 CLINICAL DATA:  Ileus EXAM: PORTABLE ABDOMEN - 1 VIEW COMPARISON:  01/23/2020 FINDINGS: Diffuse gaseous distention of colon seen previously has decreased in the interval. Feeding tube tip overlies the region of the esophagogastric junction/proximal stomach. Mild convex rightward lumbar scoliosis may be positional. IMPRESSION: Interval decrease in the diffuse gaseous colonic distention seen previously. Electronically Signed   By: Kennith Center M.D.   On: 01/25/2020 08:07    Scheduled Meds: . Chlorhexidine Gluconate Cloth  6 each Topical Daily  . enoxaparin (LOVENOX) injection  40 mg Subcutaneous Q24H  . famotidine  20 mg Oral Daily  . feeding supplement (PROSource TF)  45 mL Per Tube Daily  . influenza vac split quadrivalent PF  0.5 mL Intramuscular Tomorrow-1000  . insulin aspart  0-9 Units Subcutaneous TID WC  . lacosamide  200 mg Per Tube BID  . lactulose  20 g Per Tube QHS  . levETIRAcetam  1,000 mg Per Tube BID  . mouth rinse  15 mL Mouth Rinse BID  . PHENObarbital  60 mg Oral BID  . phenytoin  100 mg Per Tube TID  . polyethylene glycol  17 g Oral Daily  . potassium chloride  40 mEq Oral Once  . vitamin B-6  100 mg Per Tube Daily  . senna  1 tablet Oral QHS  . sodium chloride flush  10-40 mL Intracatheter Q12H  . thiamine injection  100 mg Intravenous Daily   Continuous Infusions: . 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HCC) Active Problems:   Acute metabolic encephalopathy   Hypokalemia   Polysubstance abuse (HCC)    Elevated CK   Transaminitis   Chronic hepatitis C without hepatic coma [facility])   Distended abdomen   Palliative care encounter   Consultants:  Neurology  Psychiatry  Procedures:  9/14 lumbar puncture  9/15 EEG  9/16 EEG  9/17 EEG  9/19 overnight EEG with video  9/22 overnight EEG with video with discontinuation of long-term EEG monitoring on 9/25  9/24 core track placement  Antibiotics: Anti-infectives (From admission, onward)   Start     Dose/Rate Route Frequency Ordered Stop   01/06/20 0900  cefTRIAXone (ROCEPHIN) 2 g in sodium chloride 0.9 % 100 mL IVPB  Status:  Discontinued        2 g 200 mL/hr over 30 Minutes Intravenous Every 12 hours 01/06/20 0105 01/06/20 2202   01/06/20 0800  vancomycin (VANCOCIN) IVPB 1000 mg/200 mL premix  Status:  Discontinued       "Followed by" Linked Group Details  1,000 mg 200 mL/hr over 60 Minutes Intravenous Every 12 hours 01/05/20 1904 01/06/20 2202   01/05/20 2000  vancomycin (VANCOREADY) IVPB 1500 mg/300 mL       "Followed by" Linked Group Details   1,500 mg 150 mL/hr over 120 Minutes Intravenous  Once 01/05/20 1904 01/06/20 0127   01/05/20 1830  cefTRIAXone (ROCEPHIN) 2 g in sodium chloride 0.9 % 100 mL IVPB        2 g 200 mL/hr over 30 Minutes Intravenous  Once 01/05/20 1829 01/05/20 2220        Time spent: 25    [name] ANP  [facility] Pager [phone]. If 7PM-7AM, please contact night-coverage at [email or web address] 01/25/2020, 10:57 AM  LOS: 19 days

## 2020-01-25 NOTE — Progress Notes (Signed)
Physical Therapy Treatment [name] MRN: [id number] DOB: 07-25-1985 Today's Date: 01/25/2020    History of Present Illness 34 year old with past medical history significant for hepatitis C, polysubstance abuse brought to the hospital 9/13 for disorientation which was suspected to be substance abuse.  After she became more lucid, she was discharged, but then police brought her back later in the day and they found her passed out in a parking lot.  Urine drug screen was positive for benzodiazepine and THC. An EEG done on 9/15 showed patient was in status epilepticus.  Patient was a started on Keppra.  Despite these, EEG noted continued seizures requiring additional medications as well.  Finally on 918, seizures broke. Follow-up EEG on 9/20 noted evidence of epileptic [name] from the left central temporal region.  Repeat EEG done on 9/22 noted epileptogenic city from the left central temporal region.    PT Comments    Pt seen for mobility progression. Tracked eyes to PTA x1 non sustained. No other response noted. Occasionally pt had muscle activation while doing ex's with PTA, not to command, ? Spontaneous movements. PT continues to be limited by cognitive status.    Follow Up Recommendations  SNF;Supervision/Assistance - 24 hour     Equipment Recommendations  Wheelchair (measurements PT);Wheelchair cushion (measurements PT);Hospital bed    Precautions / Restrictions Precautions Precautions: Fall Precaution Comments: seizure, cortrak Restrictions Weight Bearing Restrictions: No    Cognition Arousal/Alertness: Awake/alert Behavior During Therapy: Flat affect Overall Cognitive Status: No family/caregiver present to determine baseline cognitive functioning                General Comments: No following of commands, doesnt respond to name call, tracked eyes to PTA x1 non sustained      Exercises General Exercises - Lower Extremity Ankle Circles/Pumps:  PROM;Both;10 reps;Supine Ankle Circles/Pumps Limitations: pt intermittently assisting with ex's Heel Slides: AAROM;PROM;Strengthening;Both;10 reps;Supine;Limitations Heel Slides Limitations: pt intermittently assisted with hip/knee extension Hip ABduction/ADduction: PROM;AAROM;Strengthening;Both;10 reps;Supine;Limitations Hip Abduction/Adduction Limitations: pt intermittently assisted Straight Leg Raises: AAROM;PROM;Strengthening;Both;10 reps;Supine;Limitations Straight Leg Raises Limitations: pt intermittently assisted     Pertinent Vitals/Pain Pain Assessment: Faces Faces Pain Scale: No hurt     PT Goals (current goals can now be found in the care plan section) Acute Rehab PT Goals Patient Stated Goal: none stated and not responses made for any needs PT Goal Formulation: Patient unable to participate in goal setting Potential to Achieve Goals: Poor Progress towards PT goals: Not progressing toward goals - comment (continues to be limited in participation due to cognition)    Frequency    Min 1X/week (trial of PT to see if pt can participate)      PT Plan Current plan remains appropriate    AM-PAC PT "6 Clicks" Mobility   Outcome Measure  Help needed turning from your back to your side while in a flat bed without using bedrails?: Total Help needed moving from lying on your back to sitting on the side of a flat bed without using bedrails?: Total Help needed moving to and from a bed to a chair (including a wheelchair)?: Total Help needed standing up from a chair using your arms (e.g., wheelchair or bedside chair)?: Total Help needed to walk in hospital room?: Total Help needed climbing 3-5 steps with a railing? : Total 6 Click Score: 6    End of Session Equipment Utilized During Treatment: Gait belt Activity Tolerance: Other (comment) (limited due to cognition) Patient left: in bed;with call bell/phone within reach;with  bed alarm set;with restraints reapplied Nurse  Communication: Mobility status;Need for lift equipment PT Visit Diagnosis: Muscle weakness (generalized) (M62.81);Other symptoms and signs involving the nervous system (R29.898)     Time: 1114-1130 PT Time Calculation (min) (ACUTE ONLY): 16 min  Charges:  $Therapeutic Exercise: 8-22 mins                    Sallyanne Kuster, PTA, [facility] Acute Rehab Services Office- [phone] 01/25/20, 11:40 AM   Sallyanne Kuster 01/25/2020, 11:39 AM

## 2020-01-25 NOTE — Progress Notes (Signed)
CSW reached out to financial counseling to see about patient's work-up for Medicaid and Disability. No one has worked on patient's account as of yet, but they will add it to someone to begin screening and work-up. CSW to follow up with family to complete assessment for SNF placement.  Blenda Nicely, [place] Clinical Social Worker [phone]

## 2020-01-25 NOTE — Progress Notes (Signed)
Daily Progress Note   Patient Name: Anita Davis       Date: 01/25/2020 DOB: [birth year]  Age: 34 y.o. MRN#: [id number] Attending Physician: Edwin Dada, * Primary Care Physician: Soyla Dryer, PA-C Admit Date: 01/04/2020  Reason for Consultation/Follow-up: To discuss complex medical decision making related to patient's goals of care  Subjective: Received call from [facility] about patient's status, missing picture ID and asking general questions about how to apply for medicaid. At her request I visited patient's room - but did not find her ID in her belongings.  Patient's exam very similar to the other day when I first met her. Patient's aunt is concerned she will not improve.  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  We discussed the plan of care is to give her 4 - 6 weeks.  It has been three.  Family concerned there will be no improvement.     Discussed fecal impaction - which is now clearing - delayed PEG.  PEG is to be placed on 10/5.  Length of Stay: 19   Vital Signs: BP 115/81   Pulse 95   Temp 98.3 F (36.8 C) (Axillary)   Resp 16   Ht 5' 5"  (1.651 m)   Wt 73.9 kg   SpO2 97%   BMI 27.11 kg/m  SpO2: SpO2: 97 % O2 Device: O2 Device: Room Air O2 Flow Rate:         Palliative Assessment/Data:  20%     Palliative Care Plan    Recommendations/Plan:  Patient for PEG and SNF placement to allow for time 4-6 weeks.  Need to determine if current symptoms are reversible.  Please have Palliative follow with patient and family after discharge.  If patient does not improve family will likely shift to Hospice.  Code Status:  Full code  Prognosis:   Unable to determine   Discharge Planning:  [facility] for rehab with Palliative care service follow-up  Care plan was discussed with patient's Aunt (primary decision maker)  Thank you for allowing the Palliative Medicine Team to assist in the care of this patient.  Total time spent:  25 min.     Greater than 50%  of this time was spent counseling and coordinating care related to the above assessment  and plan.  Florentina Jenny, PA-C Palliative Medicine  Please contact Palliative MedicineTeam phone at [phone] for questions and concerns between 7 am - 7 pm.   Please see AMION for individual provider pager numbers.

## 2020-01-25 NOTE — TOC Initial Note (Signed)
Transition of Care [facility]) - Initial/Assessment Note    Patient Details  Name: Anita Davis MRN: [id number] Date of Birth: 04-13-1986  [facility]) CM/SW Contact:    Baldemar Lenis, LCSW Phone Number: 01/25/2020, 2:12 PM  Clinical Narrative:     CSW spoke with patient's aunt, Gloris Ham, who is the primary next of kin for the patient. CSW discussed process for discharge planning, including need for financial counseling to review patient's case to see if she qualifies for Medicaid and Disability, and how that needs to be done first. Financial counselor will reach out to patient's aunt for information. Patient's aunt expressed appreciation for CSW call, as she was planning to head to [place] to work on that on her own and will appreciate some assistance with it instead. CSW then explained process for LOG placement, and how there aren't a lot of facilities that will work with [place] on that. Patient's aunt asked about [facility] in [place], as she lives in [place] and that would be close for her. CSW to reach out to them with referral when appropriate. CSW also discussed possibility of guardianship with the aunt, and she asked for time to think about it. She indicated that she would be the only one of the family who might do it, and she is already the guardian over her brother who has a TBI so she is unsure if she can take on another guardianship role or not. She will think about it and get back to CSW. CSW to follow.              Expected Discharge Plan: Skilled Nursing Facility Barriers to Discharge: Continued Medical Work up, Inadequate or no insurance, Active Substance Use - Placement, SNF Pending payor source - LOG, SNF Pending Medicaid, SNF Pending bed offer   Patient Goals and CMS Choice Patient states their goals for this hospitalization and ongoing recovery are:: patient unable to participate in goal setting due to disorientation CMS Medicare.gov Compare Post  Acute Care list provided to:: Patient Represenative (must comment) (Aunt) Choice offered to / list presented to :  Midwife)  Expected Discharge Plan and Services Expected Discharge Plan: Skilled Nursing Facility In-house Referral: Artist   Post Acute Care Choice: Skilled Nursing Facility Living arrangements for the past 2 months: Mobile Home                                      Prior Living Arrangements/Services Living arrangements for the past 2 months: Mobile Home Lives with:: Self Patient language and need for interpreter reviewed:: No Do you feel safe going back to the place where you live?: Yes      Need for Family Participation in Patient Care: Yes (Comment) Care giver support system in place?: No (comment)   Criminal Activity/Legal Involvement Pertinent to Current Situation/Hospitalization: No - Comment as needed  Activities of Daily Living Home Assistive Devices/Equipment: None ADL Screening (condition at time of admission) Patient's cognitive ability adequate to safely complete daily activities?: No Is the patient deaf or have difficulty hearing?: No Does the patient have difficulty seeing, even when wearing glasses/contacts?: Yes Does the patient have difficulty concentrating, remembering, or making decisions?: Yes Patient able to express need for assistance with ADLs?: No Does the patient have difficulty dressing or bathing?: No Independently performs ADLs?: No Communication: Needs assistance Is this a change from baseline?: Change from baseline, expected  to last >3 days Dressing (OT): Needs assistance Is this a change from baseline?: Change from baseline, expected to last >3 days Grooming: Dependent Is this a change from baseline?: Change from baseline, expected to last >3 days Feeding: Dependent Is this a change from baseline?: Change from baseline, expected to last >3 days Bathing: Dependent Is this a change from baseline?: Change from  baseline, expected to last >3 days Toileting: Dependent Is this a change from baseline?: Change from baseline, expected to last >3days In/Out Bed: Dependent Is this a change from baseline?: Change from baseline, expected to last >3 days Walks in Home: Dependent Is this a change from baseline?: Change from baseline, expected to last >3 days Does the patient have difficulty walking or climbing stairs?: Yes Weakness of Legs: None Weakness of Arms/Hands: None  Permission Sought/Granted Permission sought to share information with : Facility Medical sales representative, Family Supports Permission granted to share information with : Yes, Verbal Permission Granted  Share Information with NAME: [name]  Permission granted to share info w AGENCY: SNF  Permission granted to share info w Relationship: Aunt     Emotional Assessment   Attitude/Demeanor/Rapport: Unable to Assess Affect (typically observed): Unable to Assess   Alcohol / Substance Use: Illicit Drugs Psych Involvement: Yes (comment)  Admission diagnosis:  Amphetamine and psychostimulant intoxication without complication (HCC) [F15.920] Acute metabolic encephalopathy [G93.41] Acute encephalopathy [G93.40] Patient Active Problem List   Diagnosis Date Noted  . Distended abdomen   . Palliative care encounter   . Chronic hepatitis C without hepatic coma (HCC) 01/13/2020  . Elevated CK 01/06/2020  . Transaminitis 01/06/2020  . 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 Acute metabolic encephalopathy 01/05/2020  . Hypokalemia 01/05/2020  . Polysubstance abuse (HCC) 01/05/2020  . Acute cholecystitis   . Anemia   . Abdominal pain 01/24/2017  . Hyperglycemia 01/24/2017  . Fever 01/24/2017   PCP:  [name], PA-C Pharmacy:   [facility] [phone] - [place], [place] - [address] [place] [address] Phone: [phone] Fax: [phone]     Social  Determinants of Health (SDOH) Interventions    Readmission Risk Interventions No flowsheet data found.

## 2020-01-26 LAB — GLUCOSE, CAPILLARY
Glucose-Capillary: 122 mg/dL — ABNORMAL HIGH (ref 70–99)
Glucose-Capillary: 139 mg/dL — ABNORMAL HIGH (ref 70–99)
Glucose-Capillary: 140 mg/dL — ABNORMAL HIGH (ref 70–99)
Glucose-Capillary: 163 mg/dL — ABNORMAL HIGH (ref 70–99)
Glucose-Capillary: 169 mg/dL — ABNORMAL HIGH (ref 70–99)
Glucose-Capillary: 213 mg/dL — ABNORMAL HIGH (ref 70–99)

## 2020-01-26 MED ORDER — PHENOBARBITAL 20 MG/5ML PO ELIX
60.0000 mg | ORAL_SOLUTION | Freq: Two times a day (BID) | ORAL | Status: DC
  Administered 2020-01-26 – 2020-02-08 (×26): 60 mg
  Filled 2020-01-26 (×25): qty 15

## 2020-01-26 MED ORDER — CLOTRIMAZOLE 2 % VA CREA
1.0000 | TOPICAL_CREAM | Freq: Every day | VAGINAL | Status: AC
  Administered 2020-01-26 – 2020-01-28 (×3): 1 via VAGINAL
  Filled 2020-01-26: qty 21

## 2020-01-26 MED ORDER — SENNA 8.6 MG PO TABS
1.0000 | ORAL_TABLET | Freq: Every day | ORAL | Status: DC
  Administered 2020-01-26 – 2020-03-03 (×30): 8.6 mg
  Filled 2020-01-26 (×31): qty 1

## 2020-01-26 MED ORDER — THIAMINE HCL 100 MG PO TABS
100.0000 mg | ORAL_TABLET | Freq: Every day | ORAL | Status: DC
  Administered 2020-01-26 – 2020-02-08 (×12): 100 mg
  Filled 2020-01-26 (×13): qty 1

## 2020-01-26 MED ORDER — FAMOTIDINE 40 MG/5ML PO SUSR
20.0000 mg | Freq: Every day | ORAL | Status: DC
  Administered 2020-01-26 – 2020-02-08 (×12): 20 mg
  Filled 2020-01-26 (×14): qty 2.5

## 2020-01-26 MED ORDER — MAGNESIUM CITRATE PO SOLN
1.0000 | Freq: Once | ORAL | Status: AC
  Administered 2020-01-26: 1
  Filled 2020-01-26: qty 296

## 2020-01-26 MED ORDER — POLYETHYLENE GLYCOL 3350 17 G PO PACK
17.0000 g | PACK | Freq: Every day | ORAL | Status: DC
  Administered 2020-01-26 – 2020-03-04 (×29): 17 g
  Filled 2020-01-26 (×32): qty 1

## 2020-01-26 NOTE — Progress Notes (Signed)
  Speech Language Pathology Treatment: Dysphagia  [name] MRN: [id number] DOB: Feb 25, 1986 Today's Date: 01/26/2020 Time: 1350-1410 SLP Time Calculation (min) (ACUTE ONLY): 20 min  Assessment / Plan / Recommendation Clinical Impression  Patient seen to address dysphagia goals with lunch tray (Dys 1, thin liquids) . Patient sitting in recliner, restraints have been removed by OT who was just finishing working with patient. Patient consumed 4 very small sips of liquids with maximal encouragement, but refused all other PO's. She was very fixated and perseverated on: "I just want to get up and pee" "I want this out" (Cortrak feeding tube). Of note, patient was non-verbal last week and per MD who came in room during this session, her improvement in alertness and now talking is new as of this morning.  Patient not in a cognitive state to be able to understand that she needs to eat before the Cortrak can come out. Attention is very poor, she perseverates and gets fixated on other things and she is refusing majority of PO's.      HPI HPI: Pt is a 34 year old woman who presented to [facility] 01/04/2020 with suspected methamphetamine use-discharged and brought back the same day with confusion and agitation. Toxicology consistent only with benzodiazepine and THC and not on any stimulants. 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 Per chart, mentation has been fluctuating. PMH includes hepatitis C and polysubstance abuse.      SLP Plan  Continue with current plan of care       Recommendations  Diet recommendations: Thin liquid;Dysphagia 1 (puree) Liquids provided via: Cup;Straw Medication Administration: Via alternative means Supervision: Full supervision/cueing for compensatory strategies;Staff to assist with self feeding Compensations: Minimize environmental distractions;Slow rate;Small sips/bites Postural Changes and/or Swallow Maneuvers:  Seated upright 90 degrees                Oral Care Recommendations: Oral care BID Follow up Recommendations: Skilled Nursing facility SLP Visit Diagnosis: Dysphagia, unspecified (R13.10) Plan: Continue with current plan of care       GO               Angela Nevin, MA, CCC-SLP Speech Therapy [facility] Acute Rehab Pager: [phone]

## 2020-01-26 NOTE — Progress Notes (Signed)
Occupational Therapy Treatment [name] MRN: [id number] DOB: 1986-02-03 Today's Date: 01/26/2020    History of present illness 34 year old with past medical history significant for hepatitis C, polysubstance abuse brought to the hospital 9/13 for disorientation which was suspected to be substance abuse.  After she became more lucid, she was discharged, but then police brought her back later in the day and they found her passed out in a parking lot.  Urine drug screen was positive for benzodiazepine and THC. An EEG done on 9/15 showed patient was in status epilepticus.  Patient was a started on Keppra.  Despite these, EEG noted continued seizures requiring additional medications as well.  Finally on 918, seizures broke. Follow-up EEG on 9/20 noted evidence of [name] from the left central temporal region.  Repeat EEG done on 9/22 noted epileptogenic city from the left central temporal region.   OT comments  Pt alert and oriented to name dob location. Pt reports only being at [facility] for 3 days when asked. Pt makes a shocked expression when education of 3 week admission. Pt following 1 step commands with delays and at times repetition of commands. Pt in chair with SLp arriving to address po intake. Recommendation SNF at this time pending progress.    Follow Up Recommendations  SNF    Equipment Recommendations  3 in 1 bedside commode;Wheelchair (measurements OT);Wheelchair cushion (measurements OT);Hospital bed    Recommendations for Other Services Speech consult    Precautions / Restrictions Precautions Precautions: Fall Precaution Comments: seizure, cortrack, wrist and mitten restraints       Mobility Bed Mobility Overal bed mobility: Needs Assistance Bed Mobility: Supine to Sit Rolling: Min assist   Supine to sit: Min assist     General bed mobility comments: needs tactile input to initiate. Pt needs increased time to complete task as she is  verbalizing her anxiety. once OT demonstrates request pt then return demostrates with min (A)   Transfers Overall transfer level: Needs assistance Equipment used: 1 person hand held assist Transfers: Sit to/from Stand Sit to Stand: Mod assist              Balance Overall balance assessment: Needs assistance Sitting-balance support: Bilateral upper extremity supported;Feet supported Sitting balance-Leahy Scale: Fair     Standing balance support: Bilateral upper extremity supported;During functional activity Standing balance-Leahy Scale: Poor                             ADL either performed or assessed with clinical judgement   ADL Overall ADL's : Needs assistance/impaired   Eating/Feeding Details (indicate cue type and reason): declines all food and will not hold fork. pt iwth increased voiced tone with presented Grooming: Wash/dry hands;Set up Grooming Details (indicate cue type and reason): sitting in chair with detial to between fingers             Lower Body Dressing: Min guard;Bed level   Toilet Transfer: Moderate assistance;Stand-pivot;BSC     Toileting - Clothing Manipulation Details (indicate cue type and reason): no void - RN informed as they need void of bladder        General ADL Comments: pt more alert and responding to one step commands     Vision       Perception     Praxis      Cognition Arousal/Alertness: Awake/alert Behavior During Therapy: Flat affect Overall Cognitive Status: Impaired/Different from baseline  General Comments: pt oriented to name dob and location as [place]. pt with delayed responses to all questions and at times not answering at all. pt reports name is Anita Davis when asked nick name or preferred name. Later in session reports "Anita Davis" pt with quicker eye contact with name call Anita Davis. pt sequence don of socks automatically. pt perseverating on "i dont need that my  anxiety is fine" with any food attempts        Exercises     Shoulder Instructions       General Comments pt attempting to pick at skin    Pertinent Vitals/ Pain       Pain Assessment: No/denies pain  Home Living                                          Prior Functioning/Environment              Frequency  Min 2X/week        Progress Toward Goals  OT Goals(current goals can now be found in the care plan section)  Progress towards OT goals: Progressing toward goals  Acute Rehab OT Goals Patient Stated Goal: none stated OT Goal Formulation: Patient unable to participate in goal setting Time For Goal Achievement: 02/01/20 Potential to Achieve Goals: Fair ADL Goals Pt Will Perform Eating: with mod assist;with adaptive utensils;bed level Pt Will Perform Grooming: with mod assist;sitting Additional ADL Goal #1: pt will follow 50% of commands during session  Plan Discharge plan remains appropriate    Co-evaluation                 AM-PAC OT "6 Clicks" Daily Activity     Outcome Measure   Help from another person eating meals?: A Lot Help from another person taking care of personal grooming?: A Lot Help from another person toileting, which includes using toliet, bedpan, or urinal?: A Lot Help from another person bathing (including washing, rinsing, drying)?: A Lot Help from another person to put on and taking off regular upper body clothing?: A Lot Help from another person to put on and taking off regular lower body clothing?: A Lot 6 Click Score: 12    End of Session Equipment Utilized During Treatment: Gait belt  OT Visit Diagnosis: Unsteadiness on feet (R26.81);Cognitive communication deficit (R41.841)   Activity Tolerance Patient tolerated treatment well   Patient Left in chair;with call bell/phone within reach;with chair alarm set (SLP John called to help with meal)   Nurse Communication Mobility status;Precautions         Time: 1313-1350 OT Time Calculation (min): 37 min  Charges: OT General Charges $OT Visit: 1 Visit OT Treatments $Self Care/Home Management : 23-37 mins   Brynn, OTR/L  Acute Rehabilitation Services Pager: [phone] Office: [phone] .    Mateo Flow 01/26/2020, 4:16 PM

## 2020-01-26 NOTE — Progress Notes (Addendum)
Subjective: No acute events overnight.  Patient is able to speak in short sentences this morning.  States she has mild headache.  Denies any new concerns.  ROS: negative except above  Examination  Vital signs in last 24 hours: Temp:  [98.3 F (36.8 C)-99.2 F (37.3 C)] 98.4 F (36.9 C) (10/05 0717) Pulse Rate:  [79-98] 96 (10/05 1208) Resp:  [16-20] 20 (10/05 1208) BP: (115-151)/(81-92) 140/87 (10/05 1208) SpO2:  [97 %-100 %] 98 % (10/05 1208) Weight:  [74.9 kg] 74.9 kg (10/05 0500)  General: Sitting in recliner, not in apparent distress CVS: pulse-normal rate and rhythm RS: breathing comfortably Extremities: normal, warm Neuro: Awake, alert, follows simple commands but requires repetition and constant encouragement, able to name 2 out of 3 objects correctly, cranial sutures are grossly intact, not looking at the examiner while talking, antigravity strength in all 4 extremities  Basic Metabolic Panel: Recent Labs  Lab 01/25/20 0308  NA 138  K 3.3*  CL 103  CO2 28  GLUCOSE 136*  BUN 11  CREATININE 0.62  CALCIUM 8.7*    CBC: Recent Labs  Lab 01/25/20 0308  WBC 3.6*  HGB 11.8*  HCT 34.4*  MCV 91.7  PLT 232     Coagulation Studies: No results for input(s): LABPROT, INR in the last 72 hours.  Imaging No new brain imaging overnight  ASSESSMENT AND PLAN: 34 year old female with history of polysubstance abuse (methamphetamine, THC) who initially presented to [facility] on 01/06/2020 with encephalopathy. 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 Keppra was started and eventually Vimpat was added with improvement EEG but patient continued to be altered and therefore transferred to [facility]. On arrival, EEG showed sharp waves in left central temporal region as well as lateralized rhythmic delta activity in left hemisphere which improved after adding phenytoin and therefore was most likely ictal in nature.  Since then phenobarbital also been added with further improvement in EEG (no more sharp waves). Of note, patient underwent lumbar puncture at [facility] on 01/05/2020 which did not show any pleocytosis and had normal protein and glucose. 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 During this whole time, patient has had fluctuating mental status, at 1 point she was able to follow commands and say simple words but has since had worsening again and is currently nonverbal and not following commands.  She received 5 days of IVIG, completed on 01/22/2020.  On 01/26/2020 she is starting to speak a few words again.  Encephalopathy Nonconvulsive status epilepticus (resolved) Polysubstance abuse - Differentials include autoimmune/paraneoplastic encephalitis versus less likely prolonged encephalopathy secondary to substance abuse/postictal state, psychiatric etiology  Recommendations -Continue lacosamideKeppra 1000 mg twice daily,Vimpat 200 mg twice daily, phenytoin 100 every 8 hours and phenobarb 65 mg twice daily.  We will check phenytoin and phenobarb level to get baseline. -Patient appears to have some improvement in mental status where she is able to follow some commands but continues to require repetition and was only able to name 2 out of 3 objects correctly. -Recommend follow-up with neurology in 6 to 8 weeks.  Might also consider repeat EEG and MRI brain at that time. -Can likely be weaned off of at least 1-2 antiseizure medications as an outpatient if continues to remain seizure-free. -Continue seizure precautions -As needed IV Ativan 2 mg for clinical seizure-like activity -Management of rest of comorbidities per primary team   I have spent a total  of44minuteswith the patient reviewing hospitalnotes, test results, labs and examining the patient as well as establishing an assessment and plan.>50% of time was spent in direct patient  care.    Lindie Spruce Epilepsy [facility] For questions after 5pm please refer to AMION to reach the Neurologist on call

## 2020-01-26 NOTE — Progress Notes (Signed)
Patient imaging still significant for impaction - gastrostomy placement postponed until 10/7 pending resolution of impaction.  IR will continue to follow.  Lynnette Caffey, PA-C

## 2020-01-26 NOTE — Progress Notes (Addendum)
[facility] PROGRESS NOTE  Anita Davis UJW:[id number] DOB: [birth year] DOA: 01/04/2020 PCP: Jacquelin HawkingMcElroy, Shannon, PA-C  Status: Inpatient-Remains inpatient appropriate because:Altered mental status, Ongoing diagnostic testing needed not appropriate for outpatient work up and Unsafe d/c plan -plan for PEG tube placement on 10/5   Dispo: The patient is from: Home              Anticipated d/c is to: SNF              Anticipated d/c date is: > 3 days              Patient currently is not medically stable to d/c.  Due to UNSAFE DC PLAN-patient needs placement in skilled nursing facility based on altered mentation and inability to manage self-care.  She currently requires 24/7 care due to delay from presumptive diagnosis of autoimmune encephalitis.  She is currently bedbound and will undergo PEG tube placement tentatively on 10/5.  Patient does have a dysphagia 1 diet ordered but given her variable mentation she is inconsistently and unsafely able to manage oral intake.  She will need close outpatient follow-up with neurology to determine if any additional medications and or other treatments are indicated.  Patient is self-pay financial counseling aware that patient needs work-up for Medicaid and disability.  She may also need to have a family member established as her legal guardian.  10/4 TOC spoke with patient's aunt Anita Davis and updated her on plans to have financial counseling reviewed case and reach out to family regarding application for Medicaid disability and guardianship is necessary.  Anita Davis says she is currently guardian for another family member with TBI and is uncertain if she could manage to guardianship but will discuss with other members of the family.   Code Status: Full Family Communication: No family at bedside at time of my evaluation.  TOC poke with sisters aunt Anita Davis who was her primary familial contact. DVT prophylaxis: Lovenox Vaccination status: Has  not received Covid vaccine.  HPI: 34 y.o. F with hx substance use disorder, active, hepatitis C, and gestational diabetes who presented with confusion.  Patient initially admitted in September with encephalopathy, thought to be from drug overdose.  LP showed no signs of infection. MRI brain unremarkable. She remained encephalopathic, and EEG showed focal seizures.  The seizures were suppressed, and [facility], but after seizure control, she had poor neurologic recovery.  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  She deteriorated after steroids were stopped, so IVIG was started.  Subjective: Awake.  Keeps eyes open and tracks this Clinical research associatewriter during conversation and during movements in the room.  During my interaction with the patient she appeared to cry briefly.  Towards the end of my interaction she appeared to try to mouth a word but was unsuccessful.  She was unable to follow commands.  Overall more alert today than yesterday.  Objective: Vitals:   01/26/20 0409 01/26/20 0717  BP: (!) 145/92 140/87  Pulse: 98 94  Resp: 19 18  Temp: 99.2 F (37.3 C) 98.4 F (36.9 C)  SpO2: 100% 100%    Intake/Output Summary (Last 24 hours) at 01/26/2020 1034 Last data filed at 01/25/2020 2110 Gross per 24 hour  Intake --  Output 1300 ml  Net -1300 ml   Filed Weights   01/21/20 0327 01/25/20 0500 01/26/20 0500  Weight: 76.6 kg 73.9 kg 74.9 kg  Exam:  Constitutional: NAD, calm, appears to be comfortable Respiratory: clear to auscultation bilaterally, no wheezing, no crackles. Normal respiratory effort. No accessory muscle use.  Room air Cardiovascular: Regular rate and rhythm, no murmurs / rubs / gallops. No extremity edema. 2+ pedal pulses.   Abdomen: no tenderness patient, no masses palpated.  Does appear to be slightly distended or bloated.  Bowel sounds positive.  Small bore  feeding tube per right nare with tube feeding infusing Musculoskeletal: no clubbing / cyanosis. No joint deformity upper and lower extremities.no contractures.  No tremors or spasticity noted with passive range of motion Skin: no rashes, lesions, ulcers. No induration Neurologic: CN 2-12 appear to be grossly intact based on visual inspection alone. Sensation appears to be intact given response to tactile stimulation, unable to test strength given patient's inability to participate or follow simple commands.  Again no spasticity or tremors elicited with passive range of motion.  As a precaution mitts in place. Psychiatric: Alert and kept eyes open during my entire interaction.  Did track me around the room.  Was unable to follow simple commands.  Also appeared to cry at one point during my interaction.  Attempted to mouth a word x1 but was unsuccessful.   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   -The specific etiology remains unclear, but the differential for this appears to be an autoimmune encephalitis, or post status epilepticus encephalopathy.  TSH normal.  Neuraxial imaging unremarkable.  CT C/A/P shows no signs of cancer, paraneoplastic encephalitis seems unlikely.  The clinical picture is not consistent with a true psychiatric mediated catatonia, nor an opiate/alcohol withdrawal syndrome at this point (see below regarding psychiatry recommendations).    -Completed IVIG 10/1.  In last few days, has seemed more active, though still not interactive or attentive.  Upon review of neurology notes they have documented that it may take several weeks before full effects/potential improvement from IVIG seen.  Will need reevaluation with neurologist 6 weeks from 01/22/2020 to determine if any significant improvements or whether this will be  patient's baseline mentation. -Continue phenobarbital, Vimpat, Keppra and Dilantin -Evaluated by psychiatry on 9/29-they suspected she may be experiencing a degree of withdrawal physiology therefore they added Valium 2 mg per tube twice daily this has subsequently been discontinued.  I also added low-dose Seroquel 25 mg per tube twice daily in an attempt to improve mentation, aphasia, catatonic state -Avoid sedatives otherwise-she does have Ativan ordered prn for seizure activity -PEG placement and for today placed on hold due to obstipation (see below)-even if patient endpoint is to transition to end-of-life/hospice care PEG tube would be needed to administer comfort medications such as antiepileptics. 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 -Currently on dysphagia 1 diet but given significant altered mentation, alertness and ability to participate with feeding plans are to transition to PEG tube to ensure adequate access for medications, fluids and diet -Tentative PEG tube placement rescheduled for 10/7 given ongoing obstipation   Acute Urinary retention -10/5 bladder scan w/ > 700 cc so I/O cath ordered -cont q shift bladder scans   Fever 9/30 -No fever since-possibly secondary to mild aspiration  Mild hypokalemia -Persistent issue-10/4 potassium 3.3 -  Continue per tube replacement of potassium -Follow labs  Ileus probably due to constipation -Several BM overnight.  Abdomen soft. -Resume tube feeds -Continue bowel regimen (continue Senokot and scheduled MiraLAX)-lactulose added recently and with increased bowel movements- have decreased to once daily at bedtime -Repeat X-ray 10/5 continues to demonstrate significant amount of stool in colon -We will give 1  bottle of magnesium citrate over the day and 75 to 100 cc boluses every 2 hours until bottle empty.  Nutrition Status/moderate to severe protein calorie malnutrition: Nutrition Problem: Inadequate oral intake Etiology: lethargy/confusion Signs/Symptoms: meal completion < 25% Interventions: Tube feeding, Prostat Estimated body mass index is 27.48 kg/m as calculated from the following:   Height as of this encounter: 5\' 5"  (1.651 m).   Weight as of this encounter: 74.9 kg.  Yeast vaginitis - 10/5 Begin Intravag Monistat for 3 days    Data Reviewed: Basic Metabolic Panel: Recent Labs  Lab 01/25/20 0308  NA 138  K 3.3*  CL 103  CO2 28  GLUCOSE 136*  BUN 11  CREATININE 0.62  CALCIUM 8.7*   Liver Function Tests: Recent Labs  Lab 01/25/20 0308  AST 276*  ALT 341*  ALKPHOS 75  BILITOT 0.6  PROT 8.2*  ALBUMIN 2.6*   No results for input(s): LIPASE, AMYLASE in the last 168 hours. No results for input(s): AMMONIA in the last 168 hours. CBC: Recent Labs  Lab 01/25/20 0308  WBC 3.6*  HGB 11.8*  HCT 34.4*  MCV 91.7  PLT 232   Cardiac Enzymes: No results for input(s): CKTOTAL, CKMB, CKMBINDEX, TROPONINI in the last 168 hours. BNP (last 3 results) No results for input(s): BNP in the last 8760 hours.  ProBNP (last 3 results) No results for input(s): PROBNP in the last 8760 hours.  CBG: Recent Labs  Lab 01/25/20 1552 01/25/20 1936 01/25/20 2333 01/26/20 0408 01/26/20 0715  GLUCAP 151* 141* 155* 139* 213*    No results found for this or any previous visit (from the past 240 hour(s)).   Studies: DG Abd Portable 1V  Result Date: 01/25/2020 CLINICAL DATA:  Ileus EXAM: PORTABLE ABDOMEN - 1 VIEW COMPARISON:  01/23/2020 FINDINGS: Diffuse gaseous distention of colon seen previously has decreased in the interval. Feeding tube tip overlies the region of the esophagogastric junction/proximal stomach. Mild convex rightward lumbar scoliosis may be positional. IMPRESSION:  Interval decrease in the diffuse gaseous colonic distention seen previously. Electronically Signed   By: 03/24/2020 M.D.   On: 01/25/2020 08:07    Scheduled Meds: . Chlorhexidine Gluconate Cloth  6 each Topical Daily  . enoxaparin (LOVENOX) injection  40 mg Subcutaneous Q24H  . famotidine  20 mg Per Tube Daily  . feeding supplement (PROSource TF)  45 mL Per Tube Daily  . influenza vac split quadrivalent PF  0.5 mL Intramuscular Tomorrow-1000  . insulin aspart  0-9 Units Subcutaneous TID WC  . lacosamide  200 mg Per Tube BID  . lactulose  20 g Per Tube QHS  . levETIRAcetam  1,000 mg Per Tube BID  . mouth rinse  15 mL Mouth Rinse BID  . PHENObarbital  60 mg Per Tube BID  . phenytoin  100 mg Per Tube TID  . polyethylene glycol  17 g Per Tube Daily  . vitamin B-6  100 mg Per Tube Daily  . senna  1 tablet Per Tube QHS  . sodium chloride flush  10-40 mL Intracatheter Q12H  . thiamine  100 mg Per Tube Daily  Continuous Infusions: . 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HCC) Active Problems:   Acute metabolic encephalopathy   Hypokalemia   Polysubstance abuse (HCC)   Elevated CK   Transaminitis   Chronic hepatitis C without hepatic coma [facility])   Distended abdomen   Palliative care encounter   Consultants:  Neurology  Psychiatry  Interventional radiology  Procedures:  9/14 lumbar puncture  9/15 EEG  9/16 EEG  9/17 EEG  9/19 overnight EEG with video  9/22 overnight EEG with video with discontinuation of long-term EEG monitoring on 9/25  9/24 core track placement  Antibiotics: Anti-infectives (From admission, onward)   Start     Dose/Rate Route Frequency Ordered Stop   01/06/20 0900  cefTRIAXone (ROCEPHIN) 2 g in sodium chloride 0.9 % 100 mL IVPB  Status:  Discontinued        2 g 200 mL/hr over 30 Minutes Intravenous Every 12 hours 01/06/20 0105 01/06/20 2202   01/06/20 0800  vancomycin (VANCOCIN) IVPB 1000  mg/200 mL premix  Status:  Discontinued       "Followed by" Linked Group Details   1,000 mg 200 mL/hr over 60 Minutes Intravenous Every 12 hours 01/05/20 1904 01/06/20 2202   01/05/20 2000  vancomycin (VANCOREADY) IVPB 1500 mg/300 mL       "Followed by" Linked Group Details   1,500 mg 150 mL/hr over 120 Minutes Intravenous  Once 01/05/20 1904 01/06/20 0127   01/05/20 1830  cefTRIAXone (ROCEPHIN) 2 g in sodium chloride 0.9 % 100 mL IVPB        2 g 200 mL/hr over 30 Minutes Intravenous  Once 01/05/20 1829 01/05/20 2220       Time spent: 20    [name] ANP  [facility] Pager [phone]. If 7PM-7AM, please contact night-coverage at [email or web address] 01/26/2020, 10:34 AM  LOS: 20 days

## 2020-01-27 ENCOUNTER — Inpatient Hospital Stay (HOSPITAL_COMMUNITY): Payer: Medicaid Other

## 2020-01-27 LAB — GLUCOSE, CAPILLARY
Glucose-Capillary: 121 mg/dL — ABNORMAL HIGH (ref 70–99)
Glucose-Capillary: 151 mg/dL — ABNORMAL HIGH (ref 70–99)
Glucose-Capillary: 147 mg/dL — ABNORMAL HIGH (ref 70–99)
Glucose-Capillary: 120 mg/dL — ABNORMAL HIGH (ref 70–99)
Glucose-Capillary: 100 mg/dL — ABNORMAL HIGH (ref 70–99)
Glucose-Capillary: 143 mg/dL — ABNORMAL HIGH (ref 70–99)

## 2020-01-27 LAB — PHENOBARBITAL LEVEL: Phenobarbital: 22.7 ug/mL (ref 15.0–30.0)

## 2020-01-27 LAB — PHENYTOIN LEVEL, TOTAL: Phenytoin Lvl: 2.8 ug/mL — ABNORMAL LOW (ref 10.0–20.0)

## 2020-01-27 LAB — ALBUMIN: Albumin: 3.1 g/dL — ABNORMAL LOW (ref 3.5–5.0)

## 2020-01-27 MED ORDER — PHENYTOIN 125 MG/5ML PO SUSP: 200.0000 mg | Freq: Three times a day (TID) | ORAL | Status: DC

## 2020-01-27 MED ORDER — HYDRALAZINE HCL 20 MG/ML IJ SOLN
10.0000 mg | Freq: Four times a day (QID) | INTRAMUSCULAR | Status: DC | PRN
  Administered 2020-01-27: 10 mg via INTRAVENOUS
  Filled 2020-01-27: qty 1

## 2020-01-27 MED ORDER — AMANTADINE HCL 100 MG PO CAPS
100.0000 mg | ORAL_CAPSULE | Freq: Two times a day (BID) | ORAL | Status: DC
  Administered 2020-01-27 – 2020-01-28 (×4): 100 mg via ORAL
  Filled 2020-01-27 (×5): qty 1

## 2020-01-27 MED ORDER — PHENYTOIN 125 MG/5ML PO SUSP
200.0000 mg | Freq: Two times a day (BID) | ORAL | Status: DC
  Administered 2020-01-27 – 2020-02-08 (×23): 200 mg
  Filled 2020-01-27 (×26): qty 8

## 2020-01-27 MED ORDER — ENOXAPARIN SODIUM 40 MG/0.4ML ~~LOC~~ SOLN
40.0000 mg | SUBCUTANEOUS | Status: DC
  Administered 2020-01-28 – 2020-02-09 (×13): 40 mg via SUBCUTANEOUS
  Filled 2020-01-27 (×13): qty 0.4

## 2020-01-27 NOTE — Progress Notes (Signed)
Occupational Therapy Treatment [name] MRN: [id number] DOB: 1985/07/25 Today's Date: 01/27/2020    History of present illness 34 year old with past medical history significant for hepatitis C, polysubstance abuse brought to the hospital 9/13 for disorientation which was suspected to be substance abuse.  After she became more lucid, she was discharged, but then police brought her back later in the day and they found her passed out in a parking lot.  Urine drug screen was positive for benzodiazepine and THC. An EEG done on 9/15 showed patient was in status epilepticus.  Patient was a started on Keppra.  Despite these, EEG noted continued seizures requiring additional medications as well.  Finally on 918, seizures broke. Follow-up EEG on 9/20 noted evidence of epileptic [name] from the left central temporal region.  Repeat EEG done on 9/22 noted epileptogenic city from the left central temporal region.   OT comments  Pt appears to be in a catatonic like phase again this session compared to previous sessions. Pt staring to the R side with neck rotation right without any response to commands. Pt does not seem to respond to position changes but does sustain static balance eob. Pt noted to have elevated BP without any physical (A) to therapist. Pt return to supine with stat EEG started by MD.    Follow Up Recommendations  SNF    Equipment Recommendations  3 in 1 bedside commode;Wheelchair (measurements OT);Wheelchair cushion (measurements OT);Hospital bed    Recommendations for Other Services Speech consult    Precautions / Restrictions Precautions Precautions: Fall Precaution Comments: seizure, cortrack, wrist and mitten restraints       Mobility Bed Mobility Overal bed mobility: Needs Assistance Bed Mobility: Supine to Sit;Sit to Supine Rolling: Mod assist   Supine to sit: Mod assist Sit to supine: Max assist   General bed mobility comments: pt supine to sit  with pad used with increased (A) compared to session 10/5. pt does not initiate the movement. pt static sitting eob >8 minutes min guard (A) BP obtained 140/107 with sitting sweat noted on face and lips NP Rennis Harding arriving during this time and ordering stat EEG  Transfers                 General transfer comment: not attempted due to concerns pt coudl be having seizure activity    Balance Overall balance assessment: Needs assistance Sitting-balance support: No upper extremity supported;Feet supported Sitting balance-Leahy Scale: Fair                                     ADL either performed or assessed with clinical judgement   ADL Overall ADL's : Needs assistance/impaired Eating/Feeding: Total assistance   Grooming: Total assistance                                 General ADL Comments: pt requires total (A) for all adls.     Vision       Perception     Praxis      Cognition Arousal/Alertness: Awake/alert Behavior During Therapy: Flat affect Overall Cognitive Status: Difficult to assess                                 General Comments: pt does not follow any commands. pt  staring to the right at all times, pt responds to painful stimuli in all extremities        Exercises     Shoulder Instructions       General Comments elevated BP, sweating, not responding to any commands, R gaze preference, no UB movement noted initially, does not blink to threat, dilated pupils but do react to light    Pertinent Vitals/ Pain       Pain Assessment: No/denies pain  Home Living                                          Prior Functioning/Environment              Frequency  Min 2X/week        Progress Toward Goals  OT Goals(current goals can now be found in the care plan section)  Progress towards OT goals: Not progressing toward goals - comment  Acute Rehab OT Goals Patient Stated Goal: none stated  by in a stubor state OT Goal Formulation: Patient unable to participate in goal setting Time For Goal Achievement: 02/01/20 Potential to Achieve Goals: Fair ADL Goals Pt Will Perform Eating: with mod assist;with adaptive utensils;bed level Pt Will Perform Grooming: with mod assist;sitting Additional ADL Goal #1: pt will follow 50% of commands during session  Plan Discharge plan remains appropriate    Co-evaluation                 AM-PAC OT "6 Clicks" Daily Activity     Outcome Measure   Help from another person eating meals?: A Lot Help from another person taking care of personal grooming?: A Lot Help from another person toileting, which includes using toliet, bedpan, or urinal?: A Lot Help from another person bathing (including washing, rinsing, drying)?: A Lot Help from another person to put on and taking off regular upper body clothing?: A Lot Help from another person to put on and taking off regular lower body clothing?: A Lot 6 Click Score: 12    End of Session    OT Visit Diagnosis: Unsteadiness on feet (R26.81);Cognitive communication deficit (R41.841)   Activity Tolerance Other (comment) (EEG ordered so pt returned to supine )   Patient Left in bed;with call bell/phone within reach;with nursing/sitter in room;with restraints reapplied   Nurse Communication Mobility status;Precautions        Time: 6967-8938 OT Time Calculation (min): 29 min  Charges: OT General Charges $OT Visit: 1 Visit OT Treatments $Self Care/Home Management : 23-37 mins   Brynn, OTR/L  Acute Rehabilitation Services Pager: [phone] Office: [phone] .    Mateo Flow 01/27/2020, 4:29 PM

## 2020-01-27 NOTE — Progress Notes (Signed)
Subjective: No acute events overnight.  No clinical seizures overnight.  ROS: Unable to obtain due to poor mental status  Examination  Vital signs in last 24 hours: Temp:  [97.3 F (36.3 C)-99.3 F (37.4 C)] 99.2 F (37.3 C) (10/06 1237) Pulse Rate:  [84-99] 96 (10/06 1237) Resp:  [14-19] 18 (10/06 1237) BP: (123-147)/(82-99) 124/86 (10/06 1237) SpO2:  [97 %-100 %] 98 % (10/06 1237)  General: Sitting in recliner, not in apparent distress CVS: pulse-normal rate and rhythm RS: breathing comfortably Extremities: normal, warm Neuro: Awake, alert, not following commands again today, cranial nerves II to XII are grossly intact, spontaneously moving all 4 extremities  Basic Metabolic Panel: Recent Labs  Lab 01/25/20 0308  NA 138  K 3.3*  CL 103  CO2 28  GLUCOSE 136*  BUN 11  CREATININE 0.62  CALCIUM 8.7*    CBC: Recent Labs  Lab 01/25/20 0308  WBC 3.6*  HGB 11.8*  HCT 34.4*  MCV 91.7  PLT 232     Coagulation Studies: No results for input(s): LABPROT, INR in the last 72 hours.  Imaging No new brain imaging overnight  ASSESSMENT AND PLAN: who initially presented to [facility] on 01/06/2020 with encephalopathy. 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 Keppra was started and eventually Vimpat was added with improvement EEG but patient continued to be altered and therefore transferred to [facility]. On arrival, EEG showed sharp waves in left central temporal region as well as lateralized rhythmic delta activity in left hemisphere which improved after adding phenytoin and therefore was most likely ictal in nature. Since then phenobarbital also been added with further improvement in EEG (no more sharp waves). Of note, patient underwent lumbar puncture at [facility] on 01/05/2020 which did not show any pleocytosis and had normal protein and glucose. Patient also received IV Solu-Medrol for 5 days for  possible autoimmune encephalitis. During this whole time, patient has had fluctuating mental status, at 1 point she was able to follow commands and say simple words but has since had worsening again and is currently nonverbal andnot following commands.  She received 5 days of IVIG, completed on 01/22/2020.  On 01/26/2020 she started to speak a few words again.  Unfortunately on 01/27/2020, she again appeared to be nonverbal.  Encephalopathy Nonconvulsive status epilepticus (resolved) Polysubstance abuse - Differentials include autoimmune/paraneoplastic encephalitisversus less likelyprolonged encephalopathy secondary to substance abuse/postictal state, psychiatric etiology  Recommendations -Continue lacosamideKeppra 1000 mg twice daily,Vimpat 200 mg twice daily and phenobarb 65 mg twice daily.    On phenytoin 200 mg twice daily, corrected phenytoin level 2.9 (subtherapeutic) -Patient again nonverbal and not following commands.  Therefore, will obtain an EEG to see if patient is having any further seizures in the setting of subtherapeutic phenytoin level. -If EEG normal, will discontinue phenytoin -We will also start amantadine 100 mg twice daily for neuro stimulation -Recommend follow-up with neurology in 6 to 8 weeks.  Might also consider repeat EEG and MRI brain at that time. -Can likely be weaned off of at least 1-2 antiseizure medications as an outpatient if continues to remain seizure-free. -Continue seizure precautions -As needed IV Ativan 2 mg for clinical seizure-like activity -Management of rest of comorbidities per primary team   I have spent a total of90minuteswith the patient reviewing hospitalnotes, test results, labs and examining the patient as well as establishing an assessment and plan.>50% of time was spent in direct patient care.  Anita Davis  Anita Davis Epilepsy [facility] For questions after 5pm please refer to AMION to reach the Neurologist on  call

## 2020-01-27 NOTE — Plan of Care (Signed)

## 2020-01-27 NOTE — Procedures (Addendum)
Patient Name: Anita Davis  MRN: [id number]  Epilepsy Attending: Charlsie Quest  Referring Physician/Provider: Sheralyn Boatman, NP Date: 01/26/3030 Duration: 25.27 mins  Patient history: 34 year old female who initially presented with seizures now with persistent altered mental status.  EEG to evaluate for seizures.  Level of alertness: lethargic  AEDs during EEG study: Keppra, Vimpat, phenytoin, phenobarb  Technical aspects: This EEG study was done with scalp electrodes positioned according to the 10-20 International system of electrode placement. Electrical activity was acquired at a sampling rate of 500Hz  and reviewed with a high frequency filter of 70Hz  and a low frequency filter of 1Hz . EEG data were recorded continuously and digitally stored.   Description: No clear posterior dominant rhythm was seen.  There is an excessive amount of 15 to 18 Hz beta activity distributed symmetrically and diffusely. Hyperventilation and photic stimulation were not performed.     ABNORMALITY -Excessive beta, generalized  IMPRESSION: This study is within normal limits.  No seizures or epileptiform discharges were seen throughout the recording. The excessive beta activity seen in the background is most likely due to the effect of medications and is a benign EEG pattern.   Jovonni Borquez 

## 2020-01-27 NOTE — Progress Notes (Signed)
EEG completed, results pending. 

## 2020-01-27 NOTE — Progress Notes (Addendum)
[facility] PROGRESS NOTE  Anita Davis GHW:[id number] DOB: [birth year] DOA: 01/04/2020 PCP: [name], PA-C  Status: Inpatient-Remains inpatient appropriate because:Altered mental status, Ongoing diagnostic testing needed not appropriate for outpatient work up and Unsafe d/c plan -plan for PEG tube placement on 10/5   Dispo: The patient is from: Home              Anticipated d/c is to: SNF              Anticipated d/c date is: > 3 days              Patient currently is not medically stable to d/c.  Due to UNSAFE DC PLAN-patient needs placement in skilled nursing facility based on altered mentation and inability to manage self-care.  She currently requires 24/7 care due to delay from presumptive diagnosis of autoimmune encephalitis.  She is currently bedbound and awaiting PEG tube placement patient does have a dysphagia 1 diet ordered but given her variable mentation she is inconsistently and unsafely able to manage oral intake.  Over the past 24 hours she has developed improving mentation that is waxing and waning and appears to indicate that patient may be eligible for inpatient rehabilitative services as opposed to SNF placement.  Patient is self-pay financial counseling aware that patient needs work-up for Medicaid and disability.  She may also need to have a family member established as her legal guardian.  10/4 TOC spoke with patient's aunt Ms. Hendricks and updated her on plans to have financial counseling reviewed case and reach out to family regarding application for Medicaid disability and guardianship is necessary.  [name] says she is currently guardian for another family member with TBI and is uncertain if she could manage to guardianship but will discuss with other members of the family.   Code Status: Full Family Communication: Updated on [employer]" [name] 10/6 DVT prophylaxis: Lovenox Vaccination status: Has not received Covid vaccine.  HPI: 34 y.o. F  with hx substance use disorder, active, hepatitis C, and gestational diabetes who presented with confusion.  Patient initially admitted in September with encephalopathy, thought to be from drug overdose.  LP showed no signs of infection. MRI brain unremarkable. She remained encephalopathic, and EEG showed focal seizures.  The seizures were suppressed, and she was transferred to [facility] for continuous EEG, but after seizure control, she had poor neurologic recovery.  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  She deteriorated after steroids were stopped, so IVIG was started.  Subjective: Awakened.  Opens eyes with limited tracking.  Less alert than earlier yesterday morning.  Of note patient became wide-awake yesterday afternoon, she was very alert and talkative although she did perseverate on some words and was somewhat confused.  Objective: Vitals:   01/27/20 0339 01/27/20 0802  BP: 123/83 125/82  Pulse: 94 99  Resp: 18 19  Temp: (!) 97.5 F (36.4 C) 99.3 F (37.4 C)  SpO2: 97% 97%    Intake/Output Summary (Last 24 hours) at 01/27/2020 1109 Last data filed at 01/27/2020 0516 Gross per 24 hour  Intake 2000 ml  Output 1500 ml  Net 500 ml   Filed Weights   01/21/20 0327 01/25/20 0500 01/26/20 0500  Weight: 76.6 kg 73.9 kg 74.9 kg    Exam:  Constitutional: NAD, calm, appears to be comfortable Respiratory: clear to auscultation bilaterally, no wheezing, no crackles. Normal respiratory effort. No accessory muscle use.  Room air  Cardiovascular: Regular rate and rhythm, no murmurs / rubs / gallops. No extremity edema. 2+ pedal pulses.   Abdomen: no tenderness patient, no masses palpated.  Does appear to be slightly distended or bloated.  Bowel sounds positive.  Small bore feeding tube per right nare with tube feeding infusing Musculoskeletal: no clubbing / cyanosis. No joint deformity upper and lower  extremities.no contractures.  No tremors or spasticity noted with passive range of motion Skin: no rashes, lesions, ulcers. No induration Neurologic: CN 2-12 appear to be grossly intact based on visual inspection alone. Sensation appears to be intact given response to tactile stimulation, unable to test strength given patient's inability to participate or follow simple commands.  Again no spasticity or tremors elicited with passive range of motion.  As a precaution mitts in place. Psychiatric: Awake.  More sleepy than yesterday but when awake continues to briefly track with eyes.  Late afternoon on 10/5 patient became very awake, had fluent speech, follows simple commands, did have some perseveration and words and was mostly oriented.  Reviewed prn medications and has not received any as needed sedative medications overnight.   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   -The specific etiology remains unclear, but the differential for this appears to be an autoimmune encephalitis, or post status epilepticus encephalopathy.  TSH normal.  Neuraxial imaging unremarkable.  CT C/A/P shows no signs of cancer, paraneoplastic encephalitis seems unlikely.  The clinical picture is not consistent with a true psychiatric mediated catatonia, nor an opiate/alcohol withdrawal syndrome at this point (see below regarding psychiatry recommendations).    -Completed IVIG 10/1.  In last few days, has seemed more active, though still not interactive or attentive.  Upon review of neurology notes they have documented that it may take several weeks before full effects/potential improvement from IVIG seen.  Will need reevaluation with neurologist 6 weeks from 01/22/2020 to determine if any significant improvements or whether this will be patient's baseline  mentation. -Continue phenobarbital, Vimpat, Keppra and Dilantin-Dilantin level 2.9 on 10/6 therefore Dilantin changed from 100 mg TID to 200 mg BID; neuro recommends gradual patient in discontinuation of AEDs after discharge if remains seizure-free -After discharge consider repeat EEG and MRI at follow-up in 6 to 8 weeks after discharge -Evaluated by psychiatry on 9/29-they suspected she may be experiencing a degree of withdrawal physiology therefore they added Valium 2 mg per tube twice daily this has subsequently been discontinued.  I also added low-dose Seroquel 25 mg per tube twice daily in an attempt to improve mentation, aphasia, catatonic state -Avoid sedatives otherwise-she does have Ativan ordered prn for seizure activity -Continue empiric thiamine and vitamin B6 replacement -Of note patient has had waxing and waning mentation and apparently over the weekend she was alert and restless enough to make an attempt to get out of bed attended an afternoon of 10/5 was very alert, follows simple commands, was mostly oriented, had fluent speech although at times was perseverating.  **1430 return to bedside to reevaluate patient to determine if her mentation had improved.  OT at bedside working with patient.  Patient sitting up on side of bed and remains nonverbal, will not track, appears to have right-sided preferential gaze, was diaphoretic and hypertensive with a normal CBG and initially had no blink reflex.  OT also noted early in onset of symptoms that patient appeared to have more stiffening on the right  side of the body.  Responded to previous secure chat from the neurologist Dr. Melynda RippleYadav was made aware that patient may be having acute seizures.  Neurologist presented to bedside and examined patient at which point some of her abnormal findings had improved although she still remains nonverbal, would not track otherwise was at her normal baseline that she has been experiencing throughout most of the  hospitalization.  Her waxing and waning cyclic alertness and unresponsive symptoms are quite perplexing.  EEG in process at time of dictation.  Neurology also continue previously ordered amantadine at higher dosage to see if this will improve patient's alertness.  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 -Currently on dysphagia 1 diet but given significant altered mentation, alertness and ability to participate with feeding plans are to transition to PEG tube to ensure adequate access for medications, fluids and diet -Tentative PEG tube placement rescheduled for 10/7 given ongoing obstipation  -During episode of increased awakeness on 10/5 SLP reevaluated patient but oral intake was inconsistent therefore recommended no change in current diet and to continue primary diet through feeding tube  Acute Urinary retention -10/5 bladder scan w/ > 700 cc so I/O cath ordered -cont q shift bladder scans   Fever 9/30 -No fever since-possibly secondary to mild aspiration  Mild hypokalemia -Persistent issue-10/4 potassium 3.3 -Continue per tube replacement of potassium -Follow labs  Ileus probably due to constipation -Several BM overnight.  Abdomen soft. -Resume tube feeds -Continue bowel regimen (continue Senokot and scheduled MiraLAX)-lactulose added recently and with increased bowel movements- have decreased to once daily at bedtime -Repeat X-ray 10/5 continues to demonstrate significant amount of stool in colon -We will give 1 bottle of magnesium citrate over the day and 75 to 100 cc boluses every 2 hours until bottle empty.  Multiple stools overnight. -Repeat plain x-ray on 10/7 early a.m.  Nutrition Status/moderate to severe protein calorie malnutrition: Nutrition Problem: Inadequate oral intake Etiology: lethargy/confusion Signs/Symptoms: meal completion < 25% Interventions: Tube feeding, Prostat Estimated body mass index is 27.48 kg/m as calculated from the following:   Height as  of this encounter: 5\' 5"  (1.651 m).   Weight as of this encounter: 74.9 kg.  Yeast vaginitis - 10/5 Begin Intravag Monistat for 3 days    Data Reviewed: Basic Metabolic Panel: Recent Labs  Lab 01/25/20 0308  NA 138  K 3.3*  CL 103  CO2 28  GLUCOSE 136*  BUN 11  CREATININE 0.62  CALCIUM 8.7*   Liver Function Tests: Recent Labs  Lab 01/25/20 0308 01/27/20 0400  AST 276*  --   ALT 341*  --   ALKPHOS 75  --   BILITOT 0.6  --   PROT 8.2*  --   ALBUMIN 2.6* 3.1*   No results for input(s): LIPASE, AMYLASE in the last 168 hours. No results for input(s): AMMONIA in the last 168 hours. CBC: Recent Labs  Lab 01/25/20 0308  WBC 3.6*  HGB 11.8*  HCT 34.4*  MCV 91.7  PLT 232   Cardiac Enzymes: No results for input(s): CKTOTAL, CKMB, CKMBINDEX, TROPONINI in the last 168 hours. BNP (last 3 results) No results for input(s): BNP in the last 8760 hours.  ProBNP (last 3 results) No results for input(s): PROBNP in the last 8760 hours.  CBG: Recent Labs  Lab 01/26/20 1558 01/26/20 1933 01/26/20 2353 01/27/20 0336 01/27/20 0805  GLUCAP 140* 169* 163* 121* 151*    No results found for this or any previous visit (from the past 240 hour(s)).  Studies: No results found.  Scheduled Meds: . Chlorhexidine Gluconate Cloth  6 each Topical Daily  . clotrimazole  1 Applicatorful Vaginal QHS  . enoxaparin (LOVENOX) injection  40 mg Subcutaneous Q24H  . famotidine  20 mg Per Tube Daily  . feeding supplement (PROSource TF)  45 mL Per Tube Daily  . influenza vac split quadrivalent PF  0.5 mL Intramuscular Tomorrow-1000  . insulin aspart  0-9 Units Subcutaneous TID WC  . lacosamide  200 mg Per Tube BID  . lactulose  20 g Per Tube QHS  . levETIRAcetam  1,000 mg Per Tube BID  . mouth rinse  15 mL Mouth Rinse BID  . PHENObarbital  60 mg Per Tube BID  . phenytoin  200 mg Per Tube BID  . polyethylene glycol  17 g Per Tube Daily  . vitamin B-6  100 mg Per Tube Daily  . senna   1 tablet Per Tube QHS  . sodium chloride flush  10-40 mL Intracatheter Q12H  . thiamine  100 mg Per Tube Daily   Continuous Infusions: . 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HCC) Active Problems:   Acute metabolic encephalopathy   Hypokalemia   Polysubstance abuse (HCC)   Elevated CK   Transaminitis   Chronic hepatitis C without hepatic coma [facility])   Distended abdomen   Palliative care encounter   Consultants:  Neurology  Psychiatry  Interventional radiology  Procedures:  9/14 lumbar puncture  9/15 EEG  9/16 EEG  9/17 EEG  9/19 overnight EEG with video  9/22 overnight EEG with video with discontinuation of long-term EEG monitoring on 9/25  9/24 core track placement  Antibiotics: Anti-infectives (From admission, onward)   Start     Dose/Rate Route Frequency Ordered Stop   01/06/20 0900  cefTRIAXone (ROCEPHIN) 2 g in sodium chloride 0.9 % 100 mL IVPB  Status:  Discontinued        2 g 200 mL/hr over 30 Minutes Intravenous Every 12 hours 01/06/20 0105 01/06/20 2202   01/06/20 0800  vancomycin (VANCOCIN) IVPB 1000 mg/200 mL premix  Status:  Discontinued       "Followed by" Linked Group Details   1,000 mg 200 mL/hr over 60 Minutes Intravenous Every 12 hours 01/05/20 1904 01/06/20 2202   01/05/20 2000  vancomycin (VANCOREADY) IVPB 1500 mg/300 mL       "Followed by" Linked Group Details   1,500 mg 150 mL/hr over 120 Minutes Intravenous  Once 01/05/20 1904 01/06/20 0127   01/05/20 1830  cefTRIAXone (ROCEPHIN) 2 g in sodium chloride 0.9 % 100 mL IVPB        2 g 200 mL/hr over 30 Minutes Intravenous  Once 01/05/20 1829 01/05/20 2220       Time spent: 20    [name] ANP  [facility] Pager [phone]. If 7PM-7AM, please contact night-coverage at [email or web address] 01/27/2020, 11:09 AM  LOS: 21 days

## 2020-01-27 NOTE — Progress Notes (Signed)
Nutrition Follow-up  DOCUMENTATION CODES:   Not applicable  INTERVENTION:  Continue via Cortrak: -Osmolite 1.2 cal @ 37m/hr -468mProsource TF daily  Provides 1912 kcals, 97 grams of protein, and 127990mree water   Once PEG is placed, may continue this regimen.    NUTRITION DIAGNOSIS:   Inadequate oral intake related to lethargy/confusion as evidenced by meal completion < 25%.  Ongoing  GOAL:   Patient will meet greater than or equal to 90% of their needs  Met with TF  MONITOR:   TF tolerance, Labs, Weight trends, I & O's  REASON FOR ASSESSMENT:   Consult Enteral/tube feeding initiation and management  ASSESSMENT:   Pt with refractory seizures with acute metabolic encephalopathy. PMH includes hepatitis and polysubstance abuse.  9/24 Cortrak placed (gastric)  Psychiatry evaluated pt and felt that she may be experiencing a degree of withdrawal physiology.   Pt completed IVIG. No improvements in mentation noted. Tube feeding was held from 10/1-10/3 due to ileus. Pt was supposed to have PEG placed yesterday, but procedure was put on hold due to obstipation. Pt to have repeat xray tomorrow.   Discussed pt with RN who reports pt is tolerating TF via Cortrak at goal. Current TF: Osmolite 1.2 cal @ 46m53m with 45ml29msource TF daily. Provides 1912 kcals, 97 grams of protein, and 1279ml 49m water   UOP: 1500ml x6mours I/O: -18,703.8ml sin48madmit  Labs: K+ 3.3 (L), CBGs 151-147-120 Medications: pepcid, novolog, chronulac, miralax, vitamin b6, senokot, thiamine  Diet Order:   Diet Order            Diet NPO time specified Except for: Sips with Meds  Diet effective midnight           DIET - DYS 1 Room service appropriate? Yes; Fluid consistency: Thin  Diet effective now                 EDUCATION NEEDS:   No education needs have been identified at this time  Skin:  Skin Assessment: Reviewed RN Assessment  Last BM:  10/3  Height:   Ht Readings  from Last 1 Encounters:  01/06/20 5' 5"  (1.651 m)    Weight:   Wt Readings from Last 1 Encounters:  01/26/20 74.9 kg   BMI:  Body mass index is 27.48 kg/m.  Estimated Nutritional Needs:   Kcal:  1800-2000  Protein:  90-100 grams  Fluid:  >/=1.8L/d    Alany ALarkin Ina, LDN RD pager number and weekend/on-call pager number located in [email or web address]

## 2020-01-28 ENCOUNTER — Inpatient Hospital Stay (HOSPITAL_COMMUNITY): Payer: Medicaid Other

## 2020-01-28 ENCOUNTER — Encounter (HOSPITAL_COMMUNITY): Payer: Self-pay | Admitting: Certified Registered"

## 2020-01-28 LAB — MAGNESIUM: Magnesium: 2.4 mg/dL (ref 1.7–2.4)

## 2020-01-28 LAB — COMPREHENSIVE METABOLIC PANEL
ALT: 213 U/L — ABNORMAL HIGH (ref 0–44)
AST: 103 U/L — ABNORMAL HIGH (ref 15–41)
Albumin: 3.3 g/dL — ABNORMAL LOW (ref 3.5–5.0)
Alkaline Phosphatase: 78 U/L (ref 38–126)
Anion gap: 12 (ref 5–15)
BUN: 17 mg/dL (ref 6–20)
CO2: 24 mmol/L (ref 22–32)
Calcium: 9.2 mg/dL (ref 8.9–10.3)
Chloride: 102 mmol/L (ref 98–111)
Creatinine, Ser: 0.57 mg/dL (ref 0.44–1.00)
GFR calc non Af Amer: >60 mL/min (ref >60)
Glucose, Bld: 131 mg/dL — ABNORMAL HIGH (ref 70–99)
Potassium: 4.1 mmol/L (ref 3.5–5.1)
Sodium: 138 mmol/L (ref 135–145)
Total Bilirubin: 0.5 mg/dL (ref 0.3–1.2)
Total Protein: 8.9 g/dL — ABNORMAL HIGH (ref 6.5–8.1)

## 2020-01-28 LAB — GLUCOSE, CAPILLARY
Glucose-Capillary: 120 mg/dL — ABNORMAL HIGH (ref 70–99)
Glucose-Capillary: 122 mg/dL — ABNORMAL HIGH (ref 70–99)
Glucose-Capillary: 132 mg/dL — ABNORMAL HIGH (ref 70–99)
Glucose-Capillary: 160 mg/dL — ABNORMAL HIGH (ref 70–99)
Glucose-Capillary: 162 mg/dL — ABNORMAL HIGH (ref 70–99)
Glucose-Capillary: 181 mg/dL — ABNORMAL HIGH (ref 70–99)

## 2020-01-28 LAB — CBC WITH DIFFERENTIAL/PLATELET
Abs Immature Granulocytes: 0.01 10*3/uL (ref 0.00–0.07)
Basophils Absolute: 0 10*3/uL (ref 0.0–0.1)
Basophils Relative: 1 %
Eosinophils Absolute: 0 10*3/uL (ref 0.0–0.5)
Eosinophils Relative: 0 %
HCT: 40.3 % (ref 36.0–46.0)
Hemoglobin: 13.4 g/dL (ref 12.0–15.0)
Immature Granulocytes: 0 %
Lymphocytes Relative: 53 %
Lymphs Abs: 2.6 10*3/uL (ref 0.7–4.0)
MCH: 30.8 pg (ref 26.0–34.0)
MCHC: 33.3 g/dL (ref 30.0–36.0)
MCV: 92.6 fL (ref 80.0–100.0)
Monocytes Absolute: 0.3 10*3/uL (ref 0.1–1.0)
Monocytes Relative: 7 %
Neutro Abs: 1.9 10*3/uL (ref 1.7–7.7)
Neutrophils Relative %: 39 %
Platelets: 336 10*3/uL (ref 150–400)
RBC: 4.35 MIL/uL (ref 3.87–5.11)
RDW: 12.8 % (ref 11.5–15.5)
WBC: 4.9 10*3/uL (ref 4.0–10.5)
nRBC: 0 % (ref 0.0–0.2)

## 2020-01-28 LAB — URINALYSIS, ROUTINE W REFLEX MICROSCOPIC
Bilirubin Urine: NEGATIVE
Glucose, UA: NEGATIVE mg/dL
Hgb urine dipstick: NEGATIVE
Ketones, ur: NEGATIVE mg/dL
Nitrite: NEGATIVE
Protein, ur: NEGATIVE mg/dL
Specific Gravity, Urine: 1.02 (ref 1.005–1.030)
pH: 6 (ref 5.0–8.0)

## 2020-01-28 LAB — PROTIME-INR
INR: 1 (ref 0.8–1.2)
Prothrombin Time: 12.3 seconds (ref 11.4–15.2)

## 2020-01-28 LAB — PHOSPHORUS: Phosphorus: 4.2 mg/dL (ref 2.5–4.6)

## 2020-01-28 LAB — PREALBUMIN: Prealbumin: 28.2 mg/dL (ref 18–38)

## 2020-01-28 MED ORDER — FREE WATER
200.0000 mL | Freq: Four times a day (QID) | Status: DC
  Administered 2020-01-28 – 2020-03-16 (×156): 200 mL

## 2020-01-28 MED ORDER — SODIUM CHLORIDE 0.9 % IV SOLN
1.0000 g | INTRAVENOUS | Status: AC
  Administered 2020-01-28 – 2020-02-01 (×5): 1 g via INTRAVENOUS
  Filled 2020-01-28 (×5): qty 10

## 2020-01-28 MED ORDER — CEFAZOLIN SODIUM-DEXTROSE 2-4 GM/100ML-% IV SOLN
2.0000 g | INTRAVENOUS | Status: AC
  Filled 2020-01-28: qty 100

## 2020-01-28 MED ORDER — SODIUM CHLORIDE 0.9 % IV SOLN: INTRAVENOUS | Status: DC

## 2020-01-28 NOTE — Progress Notes (Signed)
Verified with Allison,NP patient is not going for PEG tube placement today.  To resume tube feeding at the previous rate, 65cc/hour.

## 2020-01-28 NOTE — Progress Notes (Addendum)
[facility] PROGRESS NOTE  Doneen Poissonmanda Aldous ZOX:[id number] DOB: 1985-04-29 DOA: 01/04/2020 PCP: Jacquelin HawkingMcElroy, Shannon, PA-C  Status: Inpatient-Remains inpatient appropriate because:Altered mental status, Ongoing diagnostic testing needed not appropriate for outpatient work up and Unsafe d/c plan -plan for PEG tube placement on 10/5   Dispo: The patient is from: Home              Anticipated d/c is to: SNF              Anticipated d/c date is: > 3 days              Patient currently is not medically stable to d/c.  Due to UNSAFE DC PLAN-patient needs placement in skilled nursing facility based on altered mentation and inability to manage self-care.  She currently requires 24/7 care due to delay from presumptive diagnosis of autoimmune encephalitis.  She is currently bedbound and awaiting PEG tube placement patient does have a dysphagia 1 diet ordered but given her variable mentation she is inconsistently and unsafely able to manage oral intake.  Over the past 24 hours she has developed improving mentation that is waxing and waning and appears to indicate that patient may be eligible for inpatient rehabilitative services as opposed to SNF placement.  Patient is self-pay financial counseling aware that patient needs work-up for Medicaid and disability.  She may also need to have a family member established as her legal guardian.  10/4 TOC spoke with patient's aunt [name] and updated her on plans to have financial counseling review case and reach out to family regarding application for Medicaid, disability and guardianship is necessary.  Ms. Robby SermonHendricks says she is currently guardian for another family member with TBI and is uncertain if she could manage to guardianship but will discuss with other members of the family.   Code Status: Full Family Communication: Updated on [employer] Specialty ChemicalsElizabeth "Dee Dee" [name] 10/6 DVT prophylaxis: Lovenox Vaccination status: Has not received Covid vaccine.  HPI: 34 y.o. F  with hx substance use disorder, active, hepatitis C, and gestational diabetes who presented with confusion.  Patient initially admitted in September with encephalopathy, thought to be from drug overdose.  LP showed no signs of infection. MRI brain unremarkable. She remained encephalopathic, and EEG showed focal seizures.  The seizures were suppressed, and [facility], but after seizure control, she had poor neurologic recovery.  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  She deteriorated after steroids were stopped, so IVIG was started.  Subjective: Appears to be sleeping.  Utilized persistent tactile and verbal stimulation to try to awaken patient.  Would not initially awaken.  Attempted to lift eyelids to check pupils and detected some bilateral resistance.  Patient eventually open eyes and briefly tracked.  Would not attempt to follow commands or make any verbal responses.  Later noted that patient was awake and watching television.  Objective: Vitals:   01/28/20 0324 01/28/20 0726  BP: (!) 144/93 (!) 142/95  Pulse: 87 90  Resp: 20 18  Temp: 98.5 F (36.9 C) 97.7 F (36.5 C)  SpO2: 97% 97%    Intake/Output Summary (Last 24 hours) at 01/28/2020 1048 Last data filed at 01/28/2020 0130 Gross per 24 hour  Intake 120 ml  Output 1075 ml  Net -955 ml   Filed Weights   01/25/20 0500 01/26/20 0500 01/28/20 0500  Weight: 73.9 kg 74.9 kg 73.4 kg    Exam:  Constitutional: NAD, calm, appears to be comfortable Respiratory: clear to auscultation bilaterally, no wheezing, no crackles. Normal respiratory effort. No accessory muscle use.  Room air Cardiovascular: Regular rate and rhythm, no murmurs / rubs / gallops. No extremity edema. 2+ pedal pulses.   Abdomen: no tenderness patient, no masses palpated.  Does appear to be slightly distended or bloated.  Bowel sounds positive.   Small bore feeding tube per right nare with tube feeding infusing Musculoskeletal: no clubbing / cyanosis. No joint deformity upper and lower extremities.no contractures.  No tremors or spasticity noted with passive range of motion Skin: no rashes, lesions, ulcers. No induration Neurologic: CN 2-12 appear to be grossly intact based on visual inspection alone. Sensation appears to be intact given response to tactile stimulation, unable to test strength given patient's inability to participate or follow simple commands.  Again no spasticity or tremors elicited with passive range of motion.  As a precaution mitts in place. Psychiatric: Awakened with combination tactile and verbal stimulation noting became more awake after I lifted eyelids for examination.  No verbal response so unable to adequately assess orientation.  Exquisitely flat affect.   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   -The specific etiology remains unclear, but the differential for this appears to be an autoimmune encephalitis, or post status epilepticus encephalopathy.  TSH normal.  Neuraxial imaging unremarkable.  CT C/A/P shows no signs of cancer, paraneoplastic encephalitis seems unlikely.  The clinical picture is not consistent with a true psychiatric mediated catatonia, nor an opiate/alcohol withdrawal syndrome at this point (see below regarding psychiatry recommendations).    -Completed IVIG 10/1.  In last few days, has seemed more active, though still not interactive or attentive.  Upon review of neurology notes they have documented that it may take several weeks before full effects/potential improvement from IVIG seen.  Will need reevaluation with neurologist 6 weeks from 01/22/2020 to determine if any significant improvements or whether this will be patient's baseline  mentation. -Continue phenobarbital, Vimpat, and Dilantin-Dilantin level 2.9 on 10/6 therefore Dilantin changed from 100 mg TID to 200 mg BID; neuro recommends gradual patient in discontinuation of AEDs after discharge if remains seizure-free -After discharge consider repeat EEG and MRI at follow-up in 6 to 8 weeks after discharge -Evaluated by psychiatry on 9/29-they suspected she may be experiencing a degree of withdrawal physiology therefore they added Valium 2 mg per tube twice daily this has subsequently been discontinued.  I also added low-dose Seroquel 25 mg per tube twice daily in an attempt to improve mentation, aphasia, catatonic state -Avoid sedatives otherwise-she does have Ativan ordered prn for seizure activity -Continue empiric thiamine and vitamin B6 replacement -Of note patient has had waxing and waning mentation and apparently over the weekend she was alert and restless enough to make an attempt to get out of bed attended an afternoon of 10/5 was very alert, follows simple commands, was mostly oriented, had fluent speech although at times was perseverating. -10/6 patient noted with signs concerning for possible seizure activity during OT session-patient examined per myself as well as neurologist at bedside-EEG without signs of active seizure-neurology had previously initiated amantadine at higher dosage to see if this will improve patient's alertness.  As of 10/7 no significant change in mentation although once awakened patient appeared to be watching television.  Concerns at this time are of a mixed picture related to neurological  injury as well as psychogenic catatonia.  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 -Currently on dysphagia 1 diet but given significant altered mentation, alertness  and ability to participate with feeding plans are to transition to PEG tube to ensure adequate access for medications, fluids and diet -IR has reevaluated patient on 10/7 and given significant air in the colon unable to proceed with percutaneous placement of gastrostomy tube therefore general surgery consulted -During episode of increased awakeness on 10/5 SLP reevaluated patient but oral intake was inconsistent therefore recommended no change in current diet and to continue primary diet through feeding tube  Acute Urinary retention -10/5 bladder scan w/ > 700 cc so I/O cath ordered -cont q shift bladder scans   Fever 9/30 -No fever since-possibly secondary to mild aspiration  Mild hypokalemia -Persistent issue-10/4 potassium 3.3 -Continue per tube replacement of potassium -Follow labs  Ileus probably due to constipation -Several BM overnight.  Abdomen soft. -Resume tube feeds -Continue bowel regimen (continue Senokot and scheduled MiraLAX)-lactulose added recently and with increased bowel movements- have decreased to once daily at bedtime -Repeat X-ray 10/5 continues to demonstrate significant amount of stool in colon -Completed magnesium citrate -10/7 Two view abdominal films revealed significant decrease stool burden distal colon but there is persistent diffuse gas filling of the small bowel and colon throughout the abdomen  Nutrition Status/moderate to severe protein calorie malnutrition: Nutrition Problem: Inadequate oral intake Etiology: lethargy/confusion Signs/Symptoms: meal completion < 25% Interventions: Tube feeding, Prostat Estimated body mass index is 26.93 kg/m as calculated from the following:   Height as of this encounter:  (1.651 m).   Weight as of this encounter: 73.4 kg.  Yeast vaginitis - 10/5 Begin Intravag Monistat for 3 days    Data Reviewed: Basic Metabolic Panel: Recent Labs  Lab 01/25/20 0308 01/28/20 0818  NA 138 138  K 3.3* 4.1  CL 103  102  CO2 28 24  GLUCOSE 136* 131*  BUN 11 17  CREATININE 0.62 0.57  CALCIUM 8.7* 9.2  MG  --  2.4  PHOS  --  4.2   Liver Function Tests: Recent Labs  Lab 01/25/20 0308 01/27/20 0400 01/28/20 0818  AST 276*  --  103*  ALT 341*  --  213*  ALKPHOS 75  --  78  BILITOT 0.6  --  0.5  PROT 8.2*  --  8.9*  ALBUMIN 2.6* 3.1* 3.3*   No results for input(s): LIPASE, AMYLASE in the last 168 hours. No results for input(s): AMMONIA in the last 168 hours. CBC: Recent Labs  Lab 01/25/20 0308 01/28/20 0818  WBC 3.6* 4.9  NEUTROABS  --  1.9  HGB 11.8* 13.4  HCT 34.4* 40.3  MCV 91.7 92.6  PLT 232 336   Cardiac Enzymes: No results for input(s): CKTOTAL, CKMB, CKMBINDEX, TROPONINI in the last 168 hours. BNP (last 3 results) No results for input(s): BNP in the last 8760 hours.  ProBNP (last 3 results) No results for input(s): PROBNP in the last 8760 hours.  CBG: Recent Labs  Lab 01/27/20 1600 01/27/20 2038 01/28/20 0000 01/28/20 0327 01/28/20 0805  GLUCAP 100* 143* 162* 122* 120*    No results found for this or any previous visit (from the past 240 hour(s)).   Studies: DG Abd 2 Views  Result Date: 01/28/2020 CLINICAL DATA:  Obstipation  EXAM: ABDOMEN - 2 VIEW COMPARISON:  01/25/2020 FINDINGS: Enteric feeding tube is positioned with tip near the gastroesophageal junction. Diffusely gas-filled small bowel and colon throughout the abdomen, similar in caliber and configuration compared to prior examination although is significantly decreased burden of stool in the distal colon. No free air in the abdomen on supine and erect radiographs. Included lower chest is unremarkable. IMPRESSION: 1. Diffusely gas-filled small bowel and colon throughout the abdomen, similar in caliber and configuration compared to prior examination although there is significantly decreased burden of stool in the distal colon. No free air in the abdomen on supine and erect radiographs. 2. Enteric feeding tube is  position with tip near the gastroesophageal junction. Recommend advancement to ensure subdiaphragmatic positioning. Electronically Signed   By: Lauralyn Primes M.D.   On: 01/28/2020 08:18   EEG adult  Result Date: 01/27/2020 Charlsie Quest, MD     01/27/2020  3:19 PM Patient Name: Anita Davis MRN: [id number] Epilepsy Attending: Charlsie Quest Referring Physician/Provider: Sheralyn Boatman, NP Date: 01/26/3030 Duration: 25.27 mins Patient history: 34 year old female who initially presented with seizures now with persistent altered mental status.  EEG to evaluate for seizures. Level of alertness: lethargic AEDs during EEG study: Keppra, Vimpat, phenytoin, phenobarb Technical aspects: This EEG study was done with scalp electrodes positioned according to the 10-20 International system of electrode placement. Electrical activity was acquired at a sampling rate of 500Hz  and reviewed with a high frequency filter of 70Hz  and a low frequency filter of 1Hz . EEG data were recorded continuously and digitally stored. Description: No clear posterior dominant rhythm was seen.  There is an excessive amount of 15 to 18 Hz beta activity distributed symmetrically and diffusely. Hyperventilation and photic stimulation were not performed.   ABNORMALITY -Excessive beta, generalized IMPRESSION: This study is within normal limits.  No seizures or epileptiform discharges were seen throughout the recording. The excessive beta activity seen in the background is most likely due to the effect of medications and is a benign EEG pattern. Priyanka    Scheduled Meds: . amantadine  100 mg Oral BID  . Chlorhexidine Gluconate Cloth  6 each Topical Daily  . clotrimazole  1 Applicatorful Vaginal QHS  . enoxaparin (LOVENOX) injection  40 mg Subcutaneous Q24H  . famotidine  20 mg Per Tube Daily  . feeding supplement (PROSource TF)  45 mL Per Tube Daily  . influenza vac split quadrivalent PF  0.5 mL Intramuscular Tomorrow-1000  .  insulin aspart  0-9 Units Subcutaneous TID WC  . lacosamide  200 mg Per Tube BID  . lactulose  20 g Per Tube QHS  . levETIRAcetam  1,000 mg Per Tube BID  . mouth rinse  15 mL Mouth Rinse BID  . PHENObarbital  60 mg Per Tube BID  . phenytoin  200 mg Per Tube BID  . polyethylene glycol  17 g Per Tube Daily  . vitamin B-6  100 mg Per Tube Daily  . senna  1 tablet Per Tube QHS  . sodium chloride flush  10-40 mL Intracatheter Q12H  . thiamine  100 mg Per Tube Daily   Continuous Infusions: . 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HCC) Active Problems:   Acute metabolic encephalopathy   Hypokalemia   Polysubstance abuse (HCC)   Elevated CK   Transaminitis   Chronic hepatitis C without hepatic coma (HCC)   Distended abdomen   Palliative care encounter  Consultants:  Neurology  Psychiatry  Interventional radiology  Surgery  Procedures:  9/14 lumbar puncture  9/15 EEG  9/16 EEG  9/17 EEG  9/19 overnight EEG with video  9/22 overnight EEG with video with discontinuation of long-term EEG monitoring on 9/25  9/24 core track placement  10/6 EEG  Antibiotics: Anti-infectives (From admission, onward)   Start     Dose/Rate Route Frequency Ordered Stop   01/06/20 0900  cefTRIAXone (ROCEPHIN) 2 g in sodium chloride 0.9 % 100 mL IVPB  Status:  Discontinued        2 g 200 mL/hr over 30 Minutes Intravenous Every 12 hours 01/06/20 0105 01/06/20 2202   01/06/20 0800  vancomycin (VANCOCIN) IVPB 1000 mg/200 mL premix  Status:  Discontinued       "Followed by" Linked Group Details   1,000 mg 200 mL/hr over 60 Minutes Intravenous Every 12 hours 01/05/20 1904 01/06/20 2202   01/05/20 2000  vancomycin (VANCOREADY) IVPB 1500 mg/300 mL       "Followed by" Linked Group Details   1,500 mg 150 mL/hr over 120 Minutes Intravenous  Once 01/05/20 1904 01/06/20 0127   01/05/20 1830  cefTRIAXone (ROCEPHIN) 2 g in sodium chloride 0.9 % 100  mL IVPB        2 g 200 mL/hr over 30 Minutes Intravenous  Once 01/05/20 1829 01/05/20 2220       Time spent: 30    [name] ANP  [facility] Pager [phone]. If 7PM-7AM, please contact night-coverage at [email or web address] 01/28/2020, 10:48 AM  LOS: 22 days

## 2020-01-28 NOTE — Consult Note (Addendum)
[facility] Surgery Consult Note  Anita Davis 1986-04-13  [id number].    Requesting MD: Heron Nay Chief Complaint:  AMS Reason for Consult: feeding gastrotomy tube  HPI:  Patient is a 34 year old female who presented to the ED on 01/05/2020, with altered mental status.  She has a history of polysubstance abuse, hepatitis C, she has been ED on 913 due to disorientation.  This was thought to be secondary to methamphetamine use.  She was found later in the day while in a parking lot of a local business and transferred to the ED.  Drug screen was positive for benzodiazepine, and THC.  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  As of yesterday she is again nonverbal and not following commands.  EEG have shownfocal seizures.  EEG yesterday shows study is normal with no seizures, or epileptiform discharges.  She remains unresponsive.  She needs a more permanent form of tube feeding.  Currently she is being fed via Panda placed 01/15/20.  IR reviewed her KUB which shows persistent colonic air and says he cannot do it until the colon is completely decompressed.  His recommendation if the tube was needed ASAP and a surgical consult be obtained.  IR will continue to follow if it is not urgent and look for appropriate time for percutaneous gastrostomy. INR 01/28/20 - 1.0 ROS: Review of Systems  Unable to perform ROS: Patient nonverbal  Pt also unresponsive, she did open her eyes while examining her.   Family History  Problem Relation Age of Onset  . Depression Mother   . Cervical cancer Mother   . Hypertension Father   . Diabetes Father     Past Medical History:  Diagnosis Date  . Allergy   . Bronchitis   . Hepatitis-C   . History of gestational diabetes   . HPV in  female   . Substance abuse [facility])     Past Surgical History:  Procedure Laterality Date  . HEMORRHOID BANDING  age 41    Social History:  reports that she has been smoking cigarettes and cigars. She has smoked for the past 24.00 years. She has never used smokeless tobacco. She reports previous drug use. Drugs: IV, Cocaine, Hydrocodone, Oxycodone, Marijuana, and Heroin. She reports that she does not drink alcohol.  Allergies: No Known Allergies  Medications Prior to Admission  Medication Sig Dispense Refill  . cephALEXin (KEFLEX) 500 MG capsule Take 500 mg by mouth 2 (two) times daily. For 5 days.    . cetirizine (ZYRTEC) 10 MG tablet Take 10 mg by mouth daily.    . hydrOXYzine (ATARAX/VISTARIL) 25 MG tablet Take 1-2 tablets (25-50 mg total) by mouth every 6 (six) hours as needed for anxiety. (Patient taking differently: Take 75 mg by mouth 5 (five) times daily. ) 15 tablet 0  . ibuprofen (ADVIL) 200 MG tablet Take 200 mg by mouth every 6 (six) hours as needed.    . METHADONE HCL PO Take 60 mg by mouth daily.     . ondansetron (ZOFRAN-ODT) 4 MG disintegrating tablet Take 4 mg by mouth 3 (three) times daily as needed for nausea or vomiting. Starting 12/24/2019 x 4 days.    . Polyethylene Glycol 3350 (MIRALAX PO) Take by mouth.      Blood pressure (!) 142/95, pulse 90, temperature 97.7 F (36.5 C), temperature  source Oral, resp. rate 18, height 5\' 5"  (1.651 m), weight 73.4 kg, SpO2 97 %. Physical Exam:  General: pleasant, thin cachectic female laying in bed, she is unresponsive, and nonverbal, in NAD HEENT: head is normocephalic, atraumatic.  Sclera are noninjected. She has a Panda feeding tube in place. Pupils are equal.  nose with a Panda in place lesions.  Mouth/nose unable to examine Heart: regular, rate, and rhythm.  Normal s1,s2. No obvious murmurs, gallops, or rubs noted.  Palpable radial and pedal pulses bilaterally Lungs: CTAB, no wheezes, rhonchi, or rales noted.  Respiratory effort  nonlabored Abd: soft, NT, ND, +BS, no masses, hernias, or organomegaly.  She has had an umbilical piercing. MS: all 4 extremities are symmetrical with no cyanosis, clubbing, or edema.She only opened her eye while we were in the room.  She has wrist restraints but at this time does not require any restraint. Skin: warm and dry with no masses, lesions, or rashes Neuro: Cranial nerves grossly intact with limited exam. Sensation could not be tested. Psych:could not determine, pt remains unresponsive.  Results for orders placed or performed during the hospital encounter of 01/04/20 (from the past 48 hour(s))  Glucose, capillary     Status: Abnormal   Collection Time: 01/26/20 12:06 PM  Result Value Ref Range   Glucose-Capillary 122 (H) 70 - 99 mg/dL    Comment: Glucose reference range applies only to samples taken after fasting for at least 8 hours.   Comment 1 Notify RN    Comment 2 Document in Chart   Glucose, capillary     Status: Abnormal   Collection Time: 01/26/20  3:58 PM  Result Value Ref Range   Glucose-Capillary 140 (H) 70 - 99 mg/dL    Comment: Glucose reference range applies only to samples taken after fasting for at least 8 hours.   Comment 1 Notify RN    Comment 2 Document in Chart   Glucose, capillary     Status: Abnormal   Collection Time: 01/26/20  7:33 PM  Result Value Ref Range   Glucose-Capillary 169 (H) 70 - 99 mg/dL    Comment: Glucose reference range applies only to samples taken after fasting for at least 8 hours.   Comment 1 Notify RN    Comment 2 Document in Chart   Glucose, capillary     Status: Abnormal   Collection Time: 01/26/20 11:53 PM  Result Value Ref Range   Glucose-Capillary 163 (H) 70 - 99 mg/dL    Comment: Glucose reference range applies only to samples taken after fasting for at least 8 hours.   Comment 1 Notify RN    Comment 2 Document in Chart   Glucose, capillary     Status: Abnormal   Collection Time: 01/27/20  3:36 AM  Result Value Ref  Range   Glucose-Capillary 121 (H) 70 - 99 mg/dL    Comment: Glucose reference range applies only to samples taken after fasting for at least 8 hours.   Comment 1 Notify RN    Comment 2 Document in Chart   Phenytoin level, total     Status: Abnormal   Collection Time: 01/27/20  4:00 AM  Result Value Ref Range   Phenytoin Lvl 2.8 (L) 10.0 - 20.0 ug/mL    Comment: Performed at [facility], [address] [place], [place]  Phenobarbital level     Status: None   Collection Time: 01/27/20  4:00 AM  Result Value Ref Range  Phenobarbital 22.7 15.0 - 30.0 ug/mL    Comment: Performed at [facility], [address], [place], [place] [address]  Albumin     Status: Abnormal   Collection Time: 01/27/20  4:00 AM  Result Value Ref Range   Albumin 3.1 (L) 3.5 - 5.0 g/dL    Comment: Performed at [facility], [address] [place], [place] [address]  Glucose, capillary     Status: Abnormal   Collection Time: 01/27/20  8:05 AM  Result Value Ref Range   Glucose-Capillary 151 (H) 70 - 99 mg/dL    Comment: Glucose reference range applies only to samples taken after fasting for at least 8 hours.   Comment 1 Notify RN    Comment 2 Document in Chart   Glucose, capillary     Status: Abnormal   Collection Time: 01/27/20 12:34 PM  Result Value Ref Range   Glucose-Capillary 147 (H) 70 - 99 mg/dL    Comment: Glucose reference range applies only to samples taken after fasting for at least 8 hours.   Comment 1 Notify RN    Comment 2 Document in Chart   Glucose, capillary     Status: Abnormal   Collection Time: 01/27/20  2:15 PM  Result Value Ref Range   Glucose-Capillary 120 (H) 70 - 99 mg/dL    Comment: Glucose reference range applies only to samples taken after fasting for at least 8 hours.  Glucose, capillary     Status: Abnormal   Collection Time: 01/27/20  4:00 PM  Result Value Ref Range   Glucose-Capillary 100 (H) 70 - 99 mg/dL    Comment: Glucose reference range  applies only to samples taken after fasting for at least 8 hours.   Comment 1 Notify RN    Comment 2 Document in Chart   Glucose, capillary     Status: Abnormal   Collection Time: 01/27/20  8:38 PM  Result Value Ref Range   Glucose-Capillary 143 (H) 70 - 99 mg/dL    Comment: Glucose reference range applies only to samples taken after fasting for at least 8 hours.  Glucose, capillary     Status: Abnormal   Collection Time: 01/28/20 12:00 AM  Result Value Ref Range   Glucose-Capillary 162 (H) 70 - 99 mg/dL    Comment: Glucose reference range applies only to samples taken after fasting for at least 8 hours.  Glucose, capillary     Status: Abnormal   Collection Time: 01/28/20  3:27 AM  Result Value Ref Range   Glucose-Capillary 122 (H) 70 - 99 mg/dL    Comment: Glucose reference range applies only to samples taken after fasting for at least 8 hours.  Protime-INR     Status: None   Collection Time: 01/28/20  5:16 AM  Result Value Ref Range   Prothrombin Time 12.3 11.4 - 15.2 seconds   INR 1.0 0.8 - 1.2    Comment: (NOTE) INR goal varies based on device and disease states. Performed at [facility], [address], [place], [place] [address]   Glucose, capillary     Status: Abnormal   Collection Time: 01/28/20  8:05 AM  Result Value Ref Range   Glucose-Capillary 120 (H) 70 - 99 mg/dL    Comment: Glucose reference range applies only to samples taken after fasting for at least 8 hours.   Comment 1 Notify RN    Comment 2 Document in Chart   Comprehensive metabolic panel  Status: Abnormal   Collection Time: 01/28/20  8:18 AM  Result Value Ref Range   Sodium 138 135 - 145 mmol/L   Potassium 4.1 3.5 - 5.1 mmol/L   Chloride 102 98 - 111 mmol/L   CO2 24 22 - 32 mmol/L   Glucose, Bld 131 (H) 70 - 99 mg/dL    Comment: Glucose reference range applies only to samples taken after fasting for at least 8 hours.   BUN 17 6 - 20 mg/dL   Creatinine, Ser 4.090.57 0.44 - 1.00 mg/dL   Calcium  9.2 8.9 - 81.110.3 mg/dL   Total Protein 8.9 (H) 6.5 - 8.1 g/dL   Albumin 3.3 (L) 3.5 - 5.0 g/dL   AST 914103 (H) 15 - 41 U/L   ALT 213 (H) 0 - 44 U/L   Alkaline Phosphatase 78 38 - 126 U/L   Total Bilirubin 0.5 0.3 - 1.2 mg/dL   GFR calc non Af Amer >60 >60 mL/min   Anion gap 12 5 - 15    Comment: Performed at [facility], [address] [place], [place] [address]  Magnesium     Status: None   Collection Time: 01/28/20  8:18 AM  Result Value Ref Range   Magnesium 2.4 1.7 - 2.4 mg/dL    Comment: Performed at [facility], [address] [place], [place] [address]  Phosphorus     Status: None   Collection Time: 01/28/20  8:18 AM  Result Value Ref Range   Phosphorus 4.2 2.5 - 4.6 mg/dL    Comment: Performed at [facility], [address] [place], [place] [address]  CBC with Differential/Platelet     Status: None   Collection Time: 01/28/20  8:18 AM  Result Value Ref Range   WBC 4.9 4.0 - 10.5 K/uL   RBC 4.35 3.87 - 5.11 MIL/uL   Hemoglobin 13.4 12.0 - 15.0 g/dL   HCT 46.940.3 36 - 46 %   MCV 92.6 80.0 - 100.0 fL   MCH 30.8 26.0 - 34.0 pg   MCHC 33.3 30.0 - 36.0 g/dL   RDW 62.912.8 52.811.5 - 41.315.5 %   Platelets 336 150 - 400 K/uL   nRBC 0.0 0.0 - 0.2 %   Neutrophils Relative % 39 %   Neutro Abs 1.9 1.7 - 7.7 K/uL   Lymphocytes Relative 53 %   Lymphs Abs 2.6 0.7 - 4.0 K/uL   Monocytes Relative 7 %   Monocytes Absolute 0.3 0.1 - 1.0 K/uL   Eosinophils Relative 0 %   Eosinophils Absolute 0.0 0 - 0 K/uL   Basophils Relative 1 %   Basophils Absolute 0.0 0 - 0 K/uL   Immature Granulocytes 0 %   Abs Immature Granulocytes 0.01 0.00 - 0.07 K/uL    Comment: Performed at [facility], [address] [place], [place] [address]   DG Abd 2 Views  Result Date: 01/28/2020 CLINICAL DATA:  Obstipation EXAM: ABDOMEN - 2 VIEW COMPARISON:  01/25/2020 FINDINGS: Enteric feeding tube is positioned with tip near the gastroesophageal junction. Diffusely gas-filled small bowel and colon  throughout the abdomen, similar in caliber and configuration compared to prior examination although is significantly decreased burden of stool in the distal colon. No free air in the abdomen on supine and erect radiographs. Included lower chest is unremarkable. IMPRESSION: 1. Diffusely gas-filled small bowel and colon throughout the abdomen, similar in caliber and configuration compared to prior examination although there is significantly  decreased burden of stool in the distal colon. No free air in the abdomen on supine and erect radiographs. 2. Enteric feeding tube is position with tip near the gastroesophageal junction. Recommend advancement to ensure subdiaphragmatic positioning. Electronically Signed   By: Lauralyn Primes M.D.   On: 01/28/2020 08:18   EEG adult  Result Date: 01/27/2020 Charlsie Quest, MD     01/27/2020  3:19 PM Patient Name: Anita Davis MRN: [id number] Epilepsy Attending: Charlsie Quest Referring Physician/Provider: Sheralyn Boatman, NP Date: 01/26/3030 Duration: 25.27 mins Patient history: 34 year old female who initially presented with seizures now with persistent altered mental status.  EEG to evaluate for seizures. Level of alertness: lethargic AEDs during EEG study: Keppra, Vimpat, phenytoin, phenobarb Technical aspects: This EEG study was done with scalp electrodes positioned according to the 10-20 International system of electrode placement. Electrical activity was acquired at a sampling rate of  and reviewed with a high frequency filter of  and a low frequency filter of . EEG data were recorded continuously and digitally stored. Description: No clear posterior dominant rhythm was seen.  There is an excessive amount of 15 to 18 Hz beta activity distributed symmetrically and diffusely. Hyperventilation and photic stimulation were not performed.   ABNORMALITY -Excessive beta, generalized IMPRESSION: This study is within normal limits.  No seizures or epileptiform discharges  were seen throughout the recording. The excessive beta activity seen in the background is most likely due to the effect of medications and is a benign EEG pattern. Priyanka [name]   . 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  - Request for gastrostomy tube placement   FEN:  NPO/Panda tube feed ID: Clotrimazole vag cream >> day 2 DVT:  Lovenox F/U:  TBD  Plan: Called Bartholome Bill her aunt, and currently signing for her treatment.  [phone], I spoke to her at 3 PM and she is agreeable to having the procedure done. We tentatively plan to do the surgery tomorrow.  I have also talked to her nurse on the floor and explained the plan she will call [name] for the consent.     Sherrie George [facility] 01/28/2020, 10:49 AM Please see Amion for pager number during day hours 7:00am-4:30pm

## 2020-01-28 NOTE — Progress Notes (Signed)
Subjective: Patient continues to be nonverbal today.  No other acute events.  ROS: Unable to obtain due to poor mental status  Examination  Vital signs in last 24 hours: Temp:  [97.7 F (36.5 C)-99.1 F (37.3 C)] 99.1 F (37.3 C) (10/07 1141) Pulse Rate:  [87-102] 96 (10/07 1141) Resp:  [18-20] 18 (10/07 1141) BP: (131-155)/(92-102) 151/98 (10/07 1141) SpO2:  [96 %-100 %] 98 % (10/07 1141) Weight:  [73.4 kg] 73.4 kg (10/07 0500)  General:Sitting in recliner, not in apparent distress CVS: pulse-normal rate and rhythm RS: breathing comfortably Extremities: normal,warm Neuro:Awake, alert, not following commands again today, cranial nerves II to XII are grossly intact, spontaneously moving all 4 extremities   Basic Metabolic Panel: Recent Labs  Lab 01/25/20 0308 01/28/20 0818  NA 138 138  K 3.3* 4.1  CL 103 102  CO2 28 24  GLUCOSE 136* 131*  BUN 11 17  CREATININE 0.62 0.57  CALCIUM 8.7* 9.2  MG  --  2.4  PHOS  --  4.2    CBC: Recent Labs  Lab 01/25/20 0308 01/28/20 0818  WBC 3.6* 4.9  NEUTROABS  --  1.9  HGB 11.8* 13.4  HCT 34.4* 40.3  MCV 91.7 92.6  PLT 232 336     Coagulation Studies: Recent Labs    01/28/20 0516  LABPROT 12.3  INR 1.0    Imaging: No new brain imaging overnight  ASSESSMENT AND PLAN: who initially presented to [facility] on [facility]/06/2020 with encephalopathy. 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 Keppra was started and eventually Vimpat was added with improvement EEG but patient continued to be altered and therefore transferred to [facility]. On arrival, EEG showed sharp waves in left central temporal region as well as lateralized rhythmic delta activity in left hemisphere which improved after adding phenytoin and therefore was most likely ictal in nature. Since then phenobarbital also been added with further improvement in EEG (no more sharp waves). Of note, patient  underwent lumbar puncture at [facility] on 01/05/2020 which did not show any pleocytosis and had normal protein and glucose. 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 During this whole time, patient has had fluctuating mental status, at 1 point she was able to follow commands and say simple words but has since had worsening again and is currently nonverbal andnot following commands.She received 5 days of IVIG, completed on 01/22/2020. On 01/26/2020 she started to speak a few words again.  Unfortunately since 01/27/2020, she again appeared to be nonverbal.  Encephalopathy Nonconvulsive status epilepticus (resolved) Polysubstance abuse - Differentials include autoimmune/paraneoplastic encephalitisversus less likelyprolonged encephalopathy secondary to substance abuse/postictal state, psychiatric etiology -At this point, patient has been inpatient for over 3 weeks.  She had seizures on her presentation initially which have since resolved.  LP and MRI have been negative for any acute abnormality.  She has received IV steroids and IVIG without any significant change in mental status except to episodes where she was able to speak for a day or 2 and then became nonverbal again.  There was no change in patient's management prior to these episodes where she was able to speak and again before she became nonverbal. -Due to repeated waxing and waning mental status, Iit is possible that patient has neurologic as well as psychiatric basis of current nonverbal state.   Recommendations -Continue Vimpat200 mg twice daily and phenobarb 65 mg twice daily.  On phenytoin  200 mg twice daily -We will discontinue Keppra as it can sometimes cause severe depression. -Continue amantadine 100 mg twice daily for neuro stimulation -Recommend follow-up with neurology in 6 to 8 weeks. Might also consider repeat EEG and MRI brain at that time. -Can likely be weaned off  of at least1-2antiseizure medications as an outpatient if continues to remain seizure-free. -Continue seizure precautions -As needed IV Ativan 2 mg for clinical seizure-like activity -Management of rest of comorbidities per primary team -No further inpatient management from neurology standpoint  I have spent a total of23minuteswith the patient reviewing hospitalnotes, test results, labs and examining the patient as well as establishing an assessment and plan.>50% of time was spent in direct patient care.    Lindie Spruce Epilepsy [facility] For questions after 5pm please refer to AMION to reach the Neurologist on call

## 2020-01-28 NOTE — Anesthesia Preprocedure Evaluation (Deleted)
Anesthesia Evaluation    Reviewed: Allergy & Precautions, H&P , Patient's Chart, lab work & pertinent test results  Airway        Dental   Pulmonary neg pulmonary ROS, Current Smoker,           Cardiovascular Exercise Tolerance: Good negative cardio ROS       Neuro/Psych negative neurological ROS  negative psych ROS   GI/Hepatic negative GI ROS, Neg liver ROS, (+) Hepatitis -, C  Endo/Other  negative endocrine ROS  Renal/GU negative Renal ROS  negative genitourinary   Musculoskeletal   Abdominal   Peds  Hematology negative hematology ROS (+) Blood dyscrasia, anemia ,   Anesthesia Other Findings   Reproductive/Obstetrics negative OB ROS                             Anesthesia Physical Anesthesia Plan  ASA: III  Anesthesia Plan: General   Post-op Pain Management:    Induction: Intravenous  PONV Risk Score and Plan: 3 and Ondansetron, Dexamethasone and Midazolam  Airway Management Planned: Oral ETT  Additional Equipment:   Intra-op Plan:   Post-operative Plan: Extubation in OR  Informed Consent: I have reviewed the patients History and Physical, chart, labs and discussed the procedure including the risks, benefits and alternatives for the proposed anesthesia with the patient or authorized representative who has indicated his/her understanding and acceptance.       Plan Discussed with:   Anesthesia Plan Comments:         Anesthesia Quick Evaluation

## 2020-01-28 NOTE — Progress Notes (Signed)
HOSPITAL MEDICINE OVERNIGHT EVENT NOTE    Notified by nursing of UA results from straight cath performed within the past hour.    Chart reviewed.  Apparently patient has recently been suffering from urinary retention.  Nursing reports the urine upon straight cath was extremely foul smelling.  No white count or fever.  UA isn't overwhelmingly compelling for UTI but is positive for leukocytes with many bacteria.  Patient is unable to give a history and is nonverbal.  Due to the urinary retention and foul smell reported by nursing will initiate IV Ceftriaxone for possible UTI.  Day provider to review and discontinue if they feel it is not indicated at their discretion.  Marinda Elk  MD [facility]

## 2020-01-28 NOTE — Progress Notes (Signed)
Pt received CHG bath.

## 2020-01-28 NOTE — Progress Notes (Signed)
Review of KUB this AM shows persistent colonic air - reviewed with IR attending, Dr. Loreta Ave, who states colon will need to be completely decompressed before we can safely proceed with percutaneous gastrostomy placement.  If this is needed ASAP a surgical consultation is recommended as there is no way of knowing when the colon will be adequately decompressed. If this is non-urgent IR will continue to follow with periodic KUBs to assess appropriate timing of percutaneous gastrostomy placement.  Secure chat sent to Dr. Marland Mcalpine today regarding above. Patient may resume PO intake/tube feeds today from IR perspective.  Lynnette Caffey, PA-C

## 2020-01-29 ENCOUNTER — Encounter (HOSPITAL_COMMUNITY): Payer: Self-pay | Admitting: Internal Medicine

## 2020-01-29 ENCOUNTER — Encounter (HOSPITAL_COMMUNITY)
Admission: EM | Disposition: A | Discharge status: Home Health | Payer: Self-pay | Source: Home / Self Care | Attending: Family Medicine

## 2020-01-29 LAB — GLUCOSE, CAPILLARY
Glucose-Capillary: 109 mg/dL — ABNORMAL HIGH (ref 70–99)
Glucose-Capillary: 113 mg/dL — ABNORMAL HIGH (ref 70–99)
Glucose-Capillary: 126 mg/dL — ABNORMAL HIGH (ref 70–99)
Glucose-Capillary: 142 mg/dL — ABNORMAL HIGH (ref 70–99)
Glucose-Capillary: 156 mg/dL — ABNORMAL HIGH (ref 70–99)
Glucose-Capillary: 202 mg/dL — ABNORMAL HIGH (ref 70–99)

## 2020-01-29 SURGERY — INSERTION OF GASTROSTOMY TUBE
Anesthesia: General

## 2020-01-29 MED ORDER — AMANTADINE HCL 100 MG PO CAPS
100.0000 mg | ORAL_CAPSULE | Freq: Two times a day (BID) | ORAL | Status: DC
  Administered 2020-01-29: 100 mg
  Filled 2020-01-29 (×3): qty 1

## 2020-01-29 MED ORDER — ONDANSETRON HCL 4 MG/2ML IJ SOLN
INTRAMUSCULAR | Status: AC
  Filled 2020-01-29: qty 2

## 2020-01-29 MED ORDER — METHADONE HCL 10 MG PO TABS
60.0000 mg | ORAL_TABLET | Freq: Every day | ORAL | Status: DC
  Administered 2020-01-29 – 2020-02-02 (×4): 60 mg
  Filled 2020-01-29 (×5): qty 6

## 2020-01-29 MED ORDER — ALPRAZOLAM 0.25 MG PO TABS
0.2500 mg | ORAL_TABLET | Freq: Two times a day (BID) | ORAL | Status: DC
  Administered 2020-01-29 – 2020-02-02 (×7): 0.25 mg
  Filled 2020-01-29 (×8): qty 1

## 2020-01-29 MED ORDER — ROCURONIUM BROMIDE 10 MG/ML (PF) SYRINGE
PREFILLED_SYRINGE | INTRAVENOUS | Status: AC
  Filled 2020-01-29: qty 10

## 2020-01-29 MED ORDER — FENTANYL CITRATE (PF) 250 MCG/5ML IJ SOLN
INTRAMUSCULAR | Status: AC
  Filled 2020-01-29: qty 5

## 2020-01-29 MED ORDER — LIDOCAINE 2% (20 MG/ML) 5 ML SYRINGE
INTRAMUSCULAR | Status: AC
  Filled 2020-01-29: qty 5

## 2020-01-29 MED ORDER — PROPOFOL 10 MG/ML IV BOLUS
INTRAVENOUS | Status: AC
  Filled 2020-01-29: qty 20

## 2020-01-29 MED ORDER — MIDAZOLAM HCL 2 MG/2ML IJ SOLN
INTRAMUSCULAR | Status: AC
  Filled 2020-01-29: qty 2

## 2020-01-29 MED ORDER — DEXAMETHASONE SODIUM PHOSPHATE 10 MG/ML IJ SOLN
INTRAMUSCULAR | Status: AC
  Filled 2020-01-29: qty 1

## 2020-01-29 MED ORDER — OSMOLITE 1.2 CAL PO LIQD
1000.0000 mL | ORAL | Status: AC
  Administered 2020-01-29 – 2020-02-02 (×6): 1000 mL
  Filled 2020-01-29 (×5): qty 1000

## 2020-01-29 NOTE — Progress Notes (Addendum)
Patient ID: Anita Davis, female   DOB: 1986-02-09, 34 y.o.   MRN: [id number] Discussed with primary team and Anesthesia. There is concern that decreased mental status is different than it has been. They want to do further evaluation by Neurology and Psychiatry. Will cancel surgery for today. I called her aunt, Anita Davis. Her aunt reports she felt that Anita Davis's mental status was worse last night as well and she is happy we are holding off.  Violeta Gelinas, MD, MPH, FACS Please use [email or web address] to contact on call provider

## 2020-01-29 NOTE — Plan of Care (Signed)
  Problem: Education: Goal: Knowledge of General Education information will improve Description Including pain rating scale, medication(s)/side effects and non-pharmacologic comfort measures Outcome: Progressing   

## 2020-01-29 NOTE — Progress Notes (Signed)
Received report from night  Nurse regarding pt NPO ststus for surgery to have a PEG tube placement.  Went to assess the pt before sending her down, could not awaken the pt,  VSS, BG 142 .  Dr made aware, NP came and advise to call a rapid response.  Nurse from RR came and asst with assessing the p6.  Proceeded to take pt down to sx.  Sx eventually cancelled but talked about getting a CT and psych consult.     Pt later returned to the room, more awake but still non-verbal.  Orders given to resume tube feeds and meds.

## 2020-01-29 NOTE — Progress Notes (Signed)
SLP Cancellation Note  [name] MRN: [id number] DOB: [birth year]   Cancelled treatment:       Reason Eval/Treat Not Completed: Other (Pt NPO for PEG placement, was initially scheduled for this morning but was postponed due to worsening mental status this morning, will follow up as pt is able to tolerate tx).   Darthula Desa, Melanee Spry 01/29/2020, 2:18 PM

## 2020-01-29 NOTE — Significant Event (Signed)
Rapid Response Event Note   Reason for Call :  Minimally responsive to sternal rub  Initial Focused Assessment:  BP 131/96  HR 93  RR 18  O2 sat 97% on RA CBG 142 She is "unresponsive" but when I hold her arm above her face she is able to hold both the right and left without difficulty. She does open her eyes spontaneously She restricts movement when we try to straighten her head. Pupils 6, brisk reaction, bilateral equal No seizure activity noted  [name] NP also at bedside:  Feels this is a catatonic state.  Interventions:    Plan of Care:     Event Summary:   MD Notified: [name] at bedside Call Time:  0808 Arrival Time: 8333 End Time: 0830  Marcellina Millin, RN

## 2020-01-29 NOTE — Consult Note (Signed)
  Anita Davis 34 y.o.Marland Kitchenfemale patient has been hospitalization for approximately 24 days.  During hospital stay  During patient hospitalization for approximately 24 days . LP and MRI have been negative for any acute abnormality . Nonconvulsive status epilepticus (resolved) . Encephalopathic continues, EEG initially showed focal seizures, once seizures suppressed/controlled patient showed no neurologic improvement.  . She has received IV steroids then IVIG which made no considerable change in mental status except a day or 2 where she was able to speak and follow command, however since 01/27/2020 patient is nonverbal again. . Currently patient being treated for UTI . There is also concern that there is a change in mental status for the worse and unable to determine if it is neurologic or psychiatric.  Therefore, surgery for G-tube placement for 01/29/20 was canceled.   . There is no apparent psychiatric history other than polysubstance abuse.  With patient being non-verbal it is difficult to get through psychiatric history.  As stated before current etiology remains unclear although a history of extensive long-term polysubstance abuse (opiates, methadone, and benzothiazines).   As noted by Dr. Joen Laura The clinical picture is not consistent with psychiatric catatonia, nor an opiate/alcohol withdrawal syndrome.  Adding sedatives such as scheduled diazepam or Seroquel therefore are unlikely to provide clinical benefit, and cloud the ability to observe her for improvement in mentation. Seroquel discontinue 01/21/20.  No medication recommendations at this time will continue to monitor.

## 2020-01-29 NOTE — Progress Notes (Signed)
Subjective: Patient had an episode this morning where she was not responding to sternal rub for the nurse.  On evaluation by NP [name], patient was sitting in the bed with eyes open, restricting the movements when he was trying to straighten her head and was able to hold her arm about her face without difficulty.  PEG placement was canceled because of this episode.  ROS: Unable to obtain due to poor mental status  Examination  Vital signs in last 24 hours: Temp:  [97.7 F (36.5 C)-99.3 F (37.4 C)] 97.7 F (36.5 C) (10/08 1219) Pulse Rate:  [76-106] 76 (10/08 1219) Resp:  [17-18] 18 (10/08 1219) BP: (107-155)/(74-99) 155/89 (10/08 1219) SpO2:  [95 %-98 %] 95 % (10/08 1219) Weight:  [73.1 kg] 73.1 kg (10/08 0334)  General:Sitting in recliner, not in apparent distress CVS: pulse-normal rate and rhythm RS: breathing comfortably Extremities: normal,warm Neuro:Awake, alert,not following commands still, cranial nerves II to XIIare grossly intact, spontaneously moving all 4 extremities  Basic Metabolic Panel: Recent Labs  Lab 01/25/20 0308 01/28/20 0818  NA 138 138  K 3.3* 4.1  CL 103 102  CO2 28 24  GLUCOSE 136* 131*  BUN 11 17  CREATININE 0.62 0.57  CALCIUM 8.7* 9.2  MG  --  2.4  PHOS  --  4.2    CBC: Recent Labs  Lab 01/25/20 0308 01/28/20 0818  WBC 3.6* 4.9  NEUTROABS  --  1.9  HGB 11.8* 13.4  HCT 34.4* 40.3  MCV 91.7 92.6  PLT 232 336     Coagulation Studies: Recent Labs    01/28/20 0516  LABPROT 12.3  INR 1.0    Imaging No new brain imaging overnight  ASSESSMENT AND PLAN: 34 year old female who initially presented to [facility] on 01/06/2020 with encephalopathy. 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 Keppra was started and eventually Vimpat was added with improvement in EEG but patient continued to be altered and therefore transferred to [facility]. On arrival, EEG showed  sharp waves in left central temporal region as well as lateralized rhythmic delta activity in left hemisphere which improved after adding phenytoin and therefore was most likely ictal in nature. Since then phenobarbital also been added with further improvement in EEG (no more sharp waves). Of note, patient underwent lumbar puncture at [facility] on 01/05/2020 which did not show any pleocytosis and had normal protein and glucose. 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 During this whole time, patient has had fluctuating mental status, at 1 point she was able to follow commands and say simple words but has since had worsening again and is currently nonverbal andnot following commands.She received 5 days of IVIG, completed on 01/22/2020. On 01/26/2020 shestarted to speak a few words again. Unfortunately since 01/27/2020, she again appears to be nonverbal.  Encephalopathy Nonconvulsive status epilepticus (resolved) Polysubstance abuse - Differentials include autoimmune/paraneoplastic encephalitisversus less likelyprolonged encephalopathy secondary to substance abuse/postictal state, psychiatric etiology -At this point, patient has been inpatient for over 3 weeks.  She had seizures on her presentation initially which have since resolved.  LP and MRI have been negative for any acute abnormality.  She has received IV steroids and IVIG without any significant change in mental status except to episodes where she was able to speak for a day or 2 and then became nonverbal again.  There was no change in patient's management prior to these episodes where she  was able to speak and again before she became nonverbal. -Due to repeated waxing and waning mental status including the episode of possible worsening mental status today, it is possible that patient has neurologic as well as psychiatric basis of current nonverbal state.   Recommendations -Continue  Vimpat200 mg twice daily and phenobarb 65 mg twice daily, phenytoin 200 mg twice daily -Continue amantadine 100 mg twice dailyfor neuro stimulation -Recommend psychiatry consult to assess for psychologic causes for patient's nonverbal state -Recommend follow-up with neurology in 6 to 8 weeks. Might also consider repeat EEG and MRI brain at that time. -Can likely be weaned off of at least1-2antiseizure medications as an outpatient if continues to remain seizure-free. -Continue seizure precautions -As needed IV Ativan 2 mg for clinical seizure-like activity -Management of rest of comorbidities per primary team -No further inpatient management from neurology standpoint  Thank you for allowing [place] to participate in the care of this patient.  Neurology will peripherally follow.  Please page or contact us if you have any further questions  I have spent a total of55minuteswith the patient reviewing hospitalnotes, test results, labs and examining the patient as well as establishing an assessment and plan.>50% of time was spent in direct patient care.    Lindie Spruce Epilepsy [facility] For questions after 5pm please refer to AMION to reach the Neurologist on call

## 2020-01-29 NOTE — Progress Notes (Signed)
OT Cancellation Note  Patient Details Name: Brinly Maietta MRN: [id number] DOB: [birth year]   Cancelled Treatment:    Reason Eval/Treat Not Completed: Patient not medically ready (with Rapid Response Team due to unresponsive )  Evern Bio Sarena Jezek 01/29/2020, 8:31 AM  Nyoka Cowden OTR/L Acute Rehabilitation Services Pager: [phone] Office: [phone]

## 2020-01-29 NOTE — Progress Notes (Signed)
Subjective/Chief Complaint: Non-verbal   Objective: Vital signs in last 24 hours: Temp:  [97.7 F (36.5 C)-99.3 F (37.4 C)] 98.5 F (36.9 C) (10/08 0334) Pulse Rate:  [77-106] 77 (10/08 0334) Resp:  [17-18] 18 (10/08 0334) BP: (107-154)/(74-99) 107/74 (10/08 0334) SpO2:  [96 %-98 %] 96 % (10/08 0334) Weight:  [73.1 kg] 73.1 kg (10/08 0334) Last BM Date: 01/27/20  Intake/Output from previous day: 10/07 0701 - 10/08 0700 In: 625 [I.V.:225; NG/GT:300; IV Piggyback:100] Out: 925 [Urine:925] Intake/Output this shift: No intake/output data recorded.  Resp: clear to auscultation bilaterally Cardio: regular rate and rhythm GI: soft, NT Neuro: opnes eyes but not otherwise responsive  Lab Results:  Recent Labs    01/28/20 0818  WBC 4.9  HGB 13.4  HCT 40.3  PLT 336   BMET Recent Labs    01/28/20 0818  NA 138  K 4.1  CL 102  CO2 24  GLUCOSE 131*  BUN 17  CREATININE 0.57  CALCIUM 9.2   PT/INR Recent Labs    01/28/20 0516  LABPROT 12.3  INR 1.0   ABG No results for input(s): PHART, HCO3 in the last 72 hours.  Invalid input(s): PCO2, PO2  Studies/Results: DG Abd 2 Views  Result Date: 01/28/2020 CLINICAL DATA:  Obstipation EXAM: ABDOMEN - 2 VIEW COMPARISON:  01/25/2020 FINDINGS: Enteric feeding tube is positioned with tip near the gastroesophageal junction. Diffusely gas-filled small bowel and colon throughout the abdomen, similar in caliber and configuration compared to prior examination although is significantly decreased burden of stool in the distal colon. No free air in the abdomen on supine and erect radiographs. Included lower chest is unremarkable. IMPRESSION: 1. Diffusely gas-filled small bowel and colon throughout the abdomen, similar in caliber and configuration compared to prior examination although there is significantly decreased burden of stool in the distal colon. No free air in the abdomen on supine and erect radiographs. 2. Enteric feeding  tube is position with tip near the gastroesophageal junction. Recommend advancement to ensure subdiaphragmatic positioning. Electronically Signed   By: Lauralyn Primes M.D.   On: 01/28/2020 08:18   EEG adult  Result Date: 01/27/2020 Charlsie Quest, MD     01/27/2020  3:19 PM Patient Name: Eveleigh Crumpler MRN: [id number] Epilepsy Attending: Charlsie Quest Referring Physician/Provider: Sheralyn Boatman, NP Date: 01/26/3030 Duration: 25.27 mins Patient history: 34 year old female who initially presented with seizures now with persistent altered mental status.  EEG to evaluate for seizures. Level of alertness: lethargic AEDs during EEG study: Keppra, Vimpat, phenytoin, phenobarb Technical aspects: This EEG study was done with scalp electrodes positioned according to the 10-20 International system of electrode placement. Electrical activity was acquired at a sampling rate of 500Hz  and reviewed with a high frequency filter of 70Hz  and a low frequency filter of 1Hz . EEG data were recorded continuously and digitally stored. Description: No clear posterior dominant rhythm was seen.  There is an excessive amount of 15 to 18 Hz beta activity distributed symmetrically and diffusely. Hyperventilation and photic stimulation were not performed.   ABNORMALITY -Excessive beta, generalized IMPRESSION: This study is within normal limits.  No seizures or epileptiform discharges were seen throughout the recording. The excessive beta activity seen in the background is most likely due to the effect of medications and is a benign EEG pattern. Priyanka    Anti-infectives: Anti-infectives (From admission, onward)   Start     Dose/Rate Route Frequency Ordered Stop   01/29/20 0600  ceFAZolin (ANCEF) IVPB 2g/100  mL premix        2 g 200 mL/hr over 30 Minutes Intravenous On call to O.R. 01/28/20 1511 01/30/20 0559   01/28/20 2000  cefTRIAXone (ROCEPHIN) 1 g in sodium chloride 0.9 % 100 mL IVPB        1 g 200 mL/hr over 30 Minutes  Intravenous Every 24 hours 01/28/20 1925 02/02/20 1959   01/06/20 0900  cefTRIAXone (ROCEPHIN) 2 g in sodium chloride 0.9 % 100 mL IVPB  Status:  Discontinued        2 g 200 mL/hr over 30 Minutes Intravenous Every 12 hours 01/06/20 0105 01/06/20 2202   01/06/20 0800  vancomycin (VANCOCIN) IVPB 1000 mg/200 mL premix  Status:  Discontinued       "Followed by" Linked Group Details   1,000 mg 200 mL/hr over 60 Minutes Intravenous Every 12 hours 01/05/20 1904 01/06/20 2202   01/05/20 2000  vancomycin (VANCOREADY) IVPB 1500 mg/300 mL       "Followed by" Linked Group Details   1,500 mg 150 mL/hr over 120 Minutes Intravenous  Once 01/05/20 1904 01/06/20 0127   01/05/20 1830  cefTRIAXone (ROCEPHIN) 2 g in sodium chloride 0.9 % 100 mL IVPB        2 g 200 mL/hr over 30 Minutes Intravenous  Once 01/05/20 1829 01/05/20 2220      Assessment/Plan: Dysphagia due to encephalopathy - for open G tube today. Consent obtained from family yesterday. Ancef on call to OR.  LOS: 23 days    Liz Malady 01/29/2020

## 2020-01-29 NOTE — Progress Notes (Signed)
[facility] PROGRESS NOTE  Anita Davis ONG:[id number] DOB: 03/26/1986 DOA: 01/04/2020 PCP: Anita Dryer, PA-C  Status: Inpatient-Remains inpatient appropriate because:Altered mental status, Ongoing diagnostic testing needed not appropriate for outpatient work up and Unsafe d/c plan -plan for PEG tube placement on 10/5   Dispo: The patient is from: Home              Anticipated d/c is to: SNF              Anticipated d/c date is: > 3 days              Patient currently is not medically stable to d/c.  Due to UNSAFE DC PLAN-patient needs placement in skilled nursing facility based on altered mentation and inability to manage self-care.  She currently requires 24/7 care due to delay from presumptive diagnosis of autoimmune encephalitis.  She is currently bedbound and awaiting PEG tube placement patient does have a dysphagia 1 diet ordered but given her variable mentation she is inconsistently and unsafely able to manage oral intake.  Over the past 24 hours she has developed improving mentation that is waxing and waning and appears to indicate that patient may be eligible for inpatient rehabilitative services as opposed to SNF placement.  Patient is self-pay financial counseling aware that patient needs work-up for Medicaid and disability.  She may also need to have a family member established as her legal guardian.  10/4 TOC spoke with patient's aunt Ms. Anita Davis and updated her on plans to have financial counseling review case and reach out to family regarding application for Medicaid, disability and guardianship is necessary.  Ms. Anita Davis says she is currently guardian for another family member with TBI and is uncertain if she could manage to guardianship but will discuss with other members of the family.   Code Status: Full Family Communication: Updated Anita Davis "[address] Anita Davis 10/8 DVT prophylaxis: Lovenox Vaccination status: Has not received Covid vaccine.  HPI: 34 y.o. F with  hx substance use disorder, active, hepatitis C, and gestational diabetes who presented with confusion.  Patient initially admitted in September with encephalopathy, thought to be from drug overdose.  LP showed no signs of infection. MRI brain unremarkable. She remained encephalopathic, and EEG showed focal seizures.  The seizures were suppressed, and she was transferred to [facility] for continuous EEG, but after seizure control, she had poor neurologic recovery.  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  She deteriorated after steroids were stopped, so IVIG was started.  Subjective: CTSP regarding reported unresponsive state.  Arrive to bedside to find patient who would not awaken to sternal rub although when attempted to turn head met resistance.  Pupils were dilated but brisk to respond which is her baseline.  Appeared to be looking persistently downward.  Rapid response also arrived to the bedside and assisted with exam.  Arms were lifted above patient's head and dropped towards her face was able to resist and not allow arms to flaccidly dropped to the side of the bed.  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  Attending physician and neurologist dated via secure chat.  Neurologist later presented to room and agreed that current changes are likely psychogenic without any definitive neurological cause.  Anesthesia and surgeon updated and decision was made to postpone surgery for now.  Patient's aunt Anita Davis also updated.  Objective: Vitals:   01/29/20 4401 01/29/20 0272  BP: 107/74 (!) 131/96  Pulse: 77 93  Resp: 18 18  Temp: 98.5 F (36.9 C) 98.5 F (36.9 C)  SpO2: 96% 97%    Intake/Output Summary (Last 24 hours) at 01/29/2020 1154 Last data filed at 01/29/2020 0506 Gross per 24 hour  Intake 625 ml  Output 925 ml  Net -300 ml   Filed Weights    01/26/20 0500 01/28/20 0500 01/29/20 0334  Weight: 74.9 kg 73.4 kg 73.1 kg    Exam:  Constitutional: NAD, calm, appears to be comfortable Respiratory: clear to auscultation bilaterally, Normal respiratory effort. No accessory muscle use.  Room air Cardiovascular: Regular rate and rhythm, no murmurs / rubs / gallops. No extremity edema. 2+ pedal pulses.   Abdomen: no tenderness patient, no masses palpated.  Does appear to be slightly distended or bloated.  Bowel sounds positive.  Small bore feeding tube per right nare with tube feeding infusing Musculoskeletal: no clubbing / cyanosis. No joint deformity upper and lower extremities.no contractures.  No tremors or spasticity noted with passive range of motion Skin: no rashes, lesions, ulcers. No induration Neurologic: CN 2-12 grossly intact on visual inspection; see above regarding rapid response. PERRL dilated but brisk response otherwise.  Eyes midline with downward focus, positive blink reflex detected.  Not flaccid on exam documented above. Psychiatric: Able to be awakened with noxious stimuli.  Affect consistent with catatonia.   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   -The specific etiology remains unclear, but the differential for this appears to be an autoimmune encephalitis, or post status epilepticus encephalopathy.  TSH normal.  Neuraxial imaging unremarkable.  CT C/A/P shows no signs of cancer, paraneoplastic encephalitis seems unlikely.  The clinical picture is not consistent with a true psychiatric mediated catatonia, nor an opiate/alcohol withdrawal syndrome at this point (see below regarding psychiatry recommendations).    -Completed IVIG 10/1.  In last few days, has seemed more active, though still not interactive or attentive.  Upon review of neurology notes they  have documented that it may take several weeks before full effects/potential improvement from IVIG seen.  Will need reevaluation with neurologist 6 weeks from 01/22/2020 to determine if any significant improvements or whether this will be patient's baseline mentation. -Continue phenobarbital, Vimpat, and Dilantin-Dilantin level 2.9 on 10/6 therefore Dilantin changed from 100 mg TID to 200 mg BID; neuro recommends gradual patient in discontinuation of AEDs after discharge if remains seizure-free -After discharge consider repeat EEG and MRI at follow-up in 6 to 8 weeks after discharge -Evaluated by psychiatry on 9/29-they added low-dose Seroquel 25 mg per tube twice daily in an attempt to improve mentation, aphasia, catatonic state -Continue empiric thiamine and vitamin B6 replacement -Of note patient has had waxing and waning mentation -on the afternoon of 10/5 ED was very alert, followed simple commands, was mostly oriented, had fluent speech although at times was perseverating. -10/6 patient noted with sxs concerning for possible seizure activity during OT session but EEG negative for seizure activity -10/7 Neuro dc'd Keppra since can worsen depression symptoms  Chronic depression in context of ongoing polysubstance abuse prior to admission -Long discussion with patient's aunt.  Patient had been struggling with her emotions and with ongoing substance abuse for years.  This had worsened over the past several months before admission.  Patient had recently lost custody of her children because of her substance abuse and in the month  prior to admission she began after increasing daily use of methamphetamine. 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 -Currently on dysphagia 1 diet but given significant altered mentation, alertness and ability to participate with feeding plans are to transition to PEG tube to ensure adequate access for medications, fluids and diet -IR has reevaluated patient on 10/7 and given significant air in the colon unable to proceed with percutaneous placement of gastrostomy tube therefore general surgery consulted -During episode of increased awakeness on 10/5 SLP reevaluated patient but oral intake was inconsistent therefore recommended no change in current diet and to continue primary diet through feeding tube  Acute Urinary retention/abnormal urinalysis -10/5 bladder scan w/ > 700 cc so I/O cath ordered -cont q shift bladder scans -After I/O catheterization on 10/7 nurses concerned over possible abnormal appearing urine therefore urinalysis and culture obtained.  Urinalysis was abnormal so on-call physician overnight begin empiric Rocephin -Follow-up on urine culture  Mild hypokalemia -Persistent issue-10/4 potassium 3.3 -Continue per tube replacement of potassium -Follow labs  Ileus probably due to constipation -Several BM overnight.  Abdomen soft. -Resume tube feeds -Continue bowel regimen (continue Senokot and scheduled MiraLAX)-lactulose added recently and with increased bowel movements- have decreased to once daily at bedtime -Repeat X-ray 10/5 continues to demonstrate significant amount of stool in colon -Completed magnesium citrate -10/7 Two view abdominal films revealed significant decrease stool burden distal colon but there is persistent diffuse gas filling of the small bowel and colon throughout the abdomen  Nutrition Status/moderate to severe protein calorie malnutrition: Nutrition Problem: Inadequate oral  intake Etiology: lethargy/confusion Signs/Symptoms: meal completion < 25% Interventions: Tube feeding, Prostat Estimated body mass index is 26.82 kg/m as calculated from the following:   Height as of this encounter: 5' 5"  (1.651 m).   Weight as of this encounter: 73.1 kg.  Yeast vaginitis -Completed intravaginal Monistat    Data Reviewed: Basic Metabolic Panel: Recent Labs  Lab 01/25/20 0308 01/28/20 0818  NA 138 138  K 3.3* 4.1  CL 103 102  CO2 28 24  GLUCOSE 136* 131*  BUN 11 17  CREATININE 0.62 0.57  CALCIUM 8.7* 9.2  MG  --  2.4  PHOS  --  4.2   Liver Function Tests: Recent Labs  Lab 01/25/20 0308 01/27/20 0400 01/28/20 0818  AST 276*  --  103*  ALT 341*  --  213*  ALKPHOS 75  --  78  BILITOT 0.6  --  0.5  PROT 8.2*  --  8.9*  ALBUMIN 2.6* 3.1* 3.3*   No results for input(s): LIPASE, AMYLASE in the last 168 hours. No results for input(s): AMMONIA in the last 168 hours. CBC: Recent Labs  Lab 01/25/20 0308 01/28/20 0818  WBC 3.6* 4.9  NEUTROABS  --  1.9  HGB 11.8* 13.4  HCT 34.4* 40.3  MCV 91.7 92.6  PLT 232 336   Cardiac Enzymes: No results for input(s): CKTOTAL, CKMB, CKMBINDEX, TROPONINI in the last 168 hours. BNP (last 3 results) No results for input(s): BNP in the last 8760 hours.  ProBNP (last 3 results) No results for input(s): PROBNP in the last 8760 hours.  CBG: Recent Labs  Lab 01/28/20 1714 01/28/20 1955 01/29/20 0007 01/29/20 0415 01/29/20 0802  GLUCAP 181* 160* 202* 109* 142*    No results found for this or any previous visit (from the past 240 hour(s)).   Studies: DG Abd 2 Views  Result Date: 01/28/2020 CLINICAL DATA:  Obstipation EXAM: ABDOMEN - 2 VIEW COMPARISON:  01/25/2020 FINDINGS: Enteric feeding tube is positioned with tip near the gastroesophageal junction. Diffusely gas-filled small bowel and colon throughout the abdomen, similar in caliber and configuration compared to prior examination although is  significantly decreased burden of stool in the distal colon. No free air in the abdomen on supine and erect radiographs. Included lower chest is unremarkable. IMPRESSION: 1. Diffusely gas-filled small bowel and colon throughout the abdomen, similar in caliber and configuration compared to prior examination although there is significantly decreased burden of stool in the distal colon. No free air in the abdomen on supine and erect radiographs. 2. Enteric feeding tube is position with tip near the gastroesophageal junction. Recommend advancement to ensure subdiaphragmatic positioning. Electronically Signed   By: Eddie Candle M.D.   On: 01/28/2020 08:18   EEG adult  Result Date: 01/27/2020 Lora Havens, MD     01/27/2020  3:19 PM Patient Name: Anita Davis MRN: [id number] Epilepsy Attending: Lora Havens Referring Physician/Provider: Debbora Lacrosse, NP Date: 01/26/3030 Duration: 25.27 mins Patient history: 34 year old female who initially presented with seizures now with persistent altered mental status.  EEG to evaluate for seizures. Level of alertness: lethargic AEDs during EEG study: Keppra, Vimpat, phenytoin, phenobarb Technical aspects: This EEG study was done with scalp electrodes positioned according to the 10-20 International system of electrode placement. Electrical activity was acquired at a sampling rate of 500Hz  and reviewed with a high frequency filter of 70Hz  and a low frequency filter of 1Hz . EEG data were recorded continuously and digitally stored. Description: No clear posterior dominant rhythm was seen.  There is an excessive amount of 15 to 18 Hz beta activity distributed symmetrically and diffusely. Hyperventilation and photic stimulation were not performed.   ABNORMALITY -Excessive beta, generalized IMPRESSION: This study is within normal limits.  No seizures or epileptiform discharges were seen throughout the recording. The excessive beta activity seen in the background is most likely due  to the effect of medications and is a benign EEG pattern. Priyanka Barbra Sarks    Scheduled Meds: . ALPRAZolam  0.25 mg Per Tube BID  . amantadine  100 mg Per Tube BID  . Chlorhexidine Gluconate Cloth  6 each Topical Daily  . enoxaparin (LOVENOX) injection  40 mg Subcutaneous Q24H  . famotidine  20 mg Per Tube Daily  . feeding supplement (PROSource TF)  45 mL Per Tube Daily  . free water  200 mL Per Tube Q6H  . influenza vac split quadrivalent PF  0.5 mL Intramuscular Tomorrow-1000  . insulin aspart  0-9 Units Subcutaneous TID WC  . lacosamide  200 mg Per Tube BID  . lactulose  20 g Per Tube QHS  . mouth rinse  15 mL Mouth Rinse BID  . methadone  60 mg Per Tube Daily  . PHENObarbital  60 mg Per Tube BID  . phenytoin  200 mg Per Tube BID  . polyethylene glycol  17 g Per Tube Daily  . vitamin B-6  100 mg Per Tube Daily  . senna  1 tablet Per Tube QHS  . sodium chloride flush  10-40 mL Intracatheter Q12H  . thiamine  100 mg Per Tube Daily  Continuous Infusions: . sodium chloride 50 mL/hr at 01/29/20 0845  .  ceFAZolin (ANCEF) IV Stopped (01/29/20 0610)  . cefTRIAXone (ROCEPHIN)  IV 1 g (01/28/20 2001)  . 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[place]) Active Problems:   Acute metabolic encephalopathy   Hypokalemia   Polysubstance abuse ([facility])   Elevated CK   Transaminitis   Chronic hepatitis C without hepatic coma [facility])   Distended abdomen   Palliative care encounter   Consultants:  Neurology  Psychiatry  Interventional radiology  Surgery  Procedures:  9/14 lumbar puncture  9/15 EEG  9/16 EEG  9/17 EEG  9/19 overnight EEG with video  9/22 overnight EEG with video with discontinuation of long-term EEG monitoring on 9/25  9/24 core track placement  10/6 EEG  Antibiotics: Anti-infectives (From admission, onward)   Start     Dose/Rate Route Frequency Ordered Stop   01/29/20 0600  ceFAZolin (ANCEF) IVPB 2g/100 mL  premix        2 g 200 mL/hr over 30 Minutes Intravenous On call to O.R. 01/28/20 1511 01/30/20 0559   01/28/20 2000  cefTRIAXone (ROCEPHIN) 1 g in sodium chloride 0.9 % 100 mL IVPB        1 g 200 mL/hr over 30 Minutes Intravenous Every 24 hours 01/28/20 1925 02/02/20 1959   01/06/20 0900  cefTRIAXone (ROCEPHIN) 2 g in sodium chloride 0.9 % 100 mL IVPB  Status:  Discontinued        2 g 200 mL/hr over 30 Minutes Intravenous Every 12 hours 01/06/20 0105 01/06/20 2202   01/06/20 0800  vancomycin (VANCOCIN) IVPB 1000 mg/200 mL premix  Status:  Discontinued       "Followed by" Linked Group Details   1,000 mg 200 mL/hr over 60 Minutes Intravenous Every 12 hours 01/05/20 1904 01/06/20 2202   01/05/20 2000  vancomycin (VANCOREADY) IVPB 1500 mg/300 mL       "Followed by" Linked Group Details   1,500 mg 150 mL/hr over 120 Minutes Intravenous  Once 01/05/20 1904 01/06/20 0127   01/05/20 1830  cefTRIAXone (ROCEPHIN) 2 g in sodium chloride 0.9 % 100 mL IVPB        2 g 200 mL/hr over 30 Minutes Intravenous  Once 01/05/20 1829 01/05/20 2220       Time spent: [place]  [facility] Pager [phone]. If 7PM-7AM, please contact night-coverage at [email or web address] 01/29/2020, 11:54 AM  LOS: 23 days

## 2020-01-30 LAB — CBC WITH DIFFERENTIAL/PLATELET
Abs Immature Granulocytes: 0.01 10*3/uL (ref 0.00–0.07)
Basophils Absolute: 0 10*3/uL (ref 0.0–0.1)
Basophils Relative: 1 %
Eosinophils Absolute: 0 10*3/uL (ref 0.0–0.5)
Eosinophils Relative: 0 %
HCT: 36.3 % (ref 36.0–46.0)
Hemoglobin: 12 g/dL (ref 12.0–15.0)
Immature Granulocytes: 0 %
Lymphocytes Relative: 54 %
Lymphs Abs: 2.6 10*3/uL (ref 0.7–4.0)
MCH: 30.8 pg (ref 26.0–34.0)
MCHC: 33.1 g/dL (ref 30.0–36.0)
MCV: 93.3 fL (ref 80.0–100.0)
Monocytes Absolute: 0.3 10*3/uL (ref 0.1–1.0)
Monocytes Relative: 7 %
Neutro Abs: 1.9 10*3/uL (ref 1.7–7.7)
Neutrophils Relative %: 38 %
Platelets: 280 10*3/uL (ref 150–400)
RBC: 3.89 MIL/uL (ref 3.87–5.11)
RDW: 12.9 % (ref 11.5–15.5)
WBC: 4.9 10*3/uL (ref 4.0–10.5)
nRBC: 0 % (ref 0.0–0.2)

## 2020-01-30 LAB — URINE CULTURE
Culture: >=100000 — AB
Culture: NO GROWTH
Special Requests: NORMAL

## 2020-01-30 LAB — COMPREHENSIVE METABOLIC PANEL
ALT: 141 U/L — ABNORMAL HIGH (ref 0–44)
AST: 71 U/L — ABNORMAL HIGH (ref 15–41)
Albumin: 2.9 g/dL — ABNORMAL LOW (ref 3.5–5.0)
Alkaline Phosphatase: 68 U/L (ref 38–126)
Anion gap: 9 (ref 5–15)
BUN: 19 mg/dL (ref 6–20)
CO2: 24 mmol/L (ref 22–32)
Calcium: 8.6 mg/dL — ABNORMAL LOW (ref 8.9–10.3)
Chloride: 104 mmol/L (ref 98–111)
Creatinine, Ser: 0.55 mg/dL (ref 0.44–1.00)
GFR, Estimated: >60 mL/min (ref >60)
Glucose, Bld: 159 mg/dL — ABNORMAL HIGH (ref 70–99)
Potassium: 3.5 mmol/L (ref 3.5–5.1)
Sodium: 137 mmol/L (ref 135–145)
Total Bilirubin: 0.2 mg/dL — ABNORMAL LOW (ref 0.3–1.2)
Total Protein: 7.8 g/dL (ref 6.5–8.1)

## 2020-01-30 LAB — MAGNESIUM: Magnesium: 2.1 mg/dL (ref 1.7–2.4)

## 2020-01-30 LAB — GLUCOSE, CAPILLARY
Glucose-Capillary: 153 mg/dL — ABNORMAL HIGH (ref 70–99)
Glucose-Capillary: 161 mg/dL — ABNORMAL HIGH (ref 70–99)
Glucose-Capillary: 162 mg/dL — ABNORMAL HIGH (ref 70–99)
Glucose-Capillary: 169 mg/dL — ABNORMAL HIGH (ref 70–99)
Glucose-Capillary: 173 mg/dL — ABNORMAL HIGH (ref 70–99)
Glucose-Capillary: 179 mg/dL — ABNORMAL HIGH (ref 70–99)
Glucose-Capillary: 92 mg/dL (ref 70–99)

## 2020-01-30 LAB — PHOSPHORUS: Phosphorus: 3.3 mg/dL (ref 2.5–4.6)

## 2020-01-30 MED ORDER — AMANTADINE HCL 50 MG/5ML PO SYRP
100.0000 mg | ORAL_SOLUTION | Freq: Two times a day (BID) | ORAL | Status: DC
  Administered 2020-01-30 – 2020-03-06 (×65): 100 mg
  Filled 2020-01-30 (×77): qty 10

## 2020-01-30 MED ORDER — POTASSIUM CHLORIDE 20 MEQ/15ML (10%) PO SOLN
40.0000 meq | Freq: Once | ORAL | Status: AC
  Administered 2020-01-30: 40 meq
  Filled 2020-01-30: qty 30

## 2020-01-30 NOTE — Progress Notes (Signed)
PROGRESS NOTE    Anita Davis  SAY:[id number] DOB: 1985-10-11 DOA: 01/04/2020 PCP: [name], PA-C  Brief Narrative: The patient is a 34 year old Caucasian female with past medical history significant for not limited to substance abuse disorder, hepatitis C, history of gestational diabetes as well as other comorbidities who presented with confusion.  She is initially admitted back in September with encephalopathy thought to be from a drug overdose.  LP was done and showed no signs of infection.  MRI done and was relatively unremarkable.  She has remained encephalopathic and she has had multiple EEGs and EEG showed focal seizures.  The seizures were suppressed and she was transferred to comfort continues EEG but after her seizure control she has had for neurological recovery.  Initially autoimmune encephalitis was considered but the patient was started on high-dose steroids with some equivocal improvement.  She then further discontinued after steroids with her stop and IVIG has started.  She continues to be significantly encephalopathic and appears to be catatonic now.  Neurology actively following and will reconsult psychiatric treatment given that this could be a possible psychiatric induced catatonia would likely her encephalopathy is multifactorial.  Hospitalization has been complicated by a urinary tract infection so she has been started on IV Rocephin as well as complicated by acute urinary tension so Foley catheter has been placed.  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 -The specific etiology remains unclear, but the differential for this appears to be an autoimmune encephalitis, or post status epilepticus encephalopathy.  -TSH normal.  -Neuraxial imaging unremarkable.  -CT C/A/P shows no signs of cancer, paraneoplastic encephalitis seems unlikely.The clinical picture is not consistent with a true psychiatric mediated catatonia and is likely multifactorial, nor an opiate/alcohol withdrawal syndromeat this point (see below regarding psychiatry recommendations).  -Completed IVIG10/1. In last few days, has seemed more active, though still not interactive or attentive.  Upon review of neurology notes they have documented that it may take several weeks before full effects/potential improvement from IVIG seen.  Will need reevaluation with neurologist 6 weeks from 01/22/2020 to determine if any significant improvements or whether this will be patient's baseline mentation. -Continue phenobarbital, Vimpat, and Dilantin-Dilantin level 2.9 on 10/6 therefore Dilantin changed from 100 mg TID to 200 mg BID; neuro recommends gradual patient in discontinuation of AEDs after discharge if remains seizure-free -After discharge consider repeat EEG and MRI at follow-up in 6 to 8 weeks after discharge -Evaluated by psychiatry on 9/29 and reconsulted 10/8 -they added low-dose Seroquel 25 mg per tube twice daily in an attempt to improve mentation, aphasia, catatonic state -Continue empiric thiamine and vitamin B6 replacement -Of note patient has had waxing and waning mentation -on the afternoon of 10/5 ED was very alert, followed simple commands, was mostly oriented, had fluent speech although at times was perseverating. -10/6 patient noted with sxs concerning for possible seizure activity during OT session but EEG negative for seizure activity -10/7 Neuro dc'd Keppra since can worsen depression symptoms -See Below  Chronic depression in context of ongoing  polysubstance abuse prior to admission -The Nurse Practitioner [name] had  Long discussion with patient's aunt.  Patient had been struggling with her emotions and with ongoing substance abuse for years.  This had worsened over the past several months before admission.  Patient had recently lost custody of her children because of her substance abuse and in the month prior to admission she began after increasing daily use of methamphetamine. 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 -Currently on dysphagia 1 diet but given significant altered mentation, alertness and ability to participate with feeding plans are to transition to PEG tube to ensure adequate access for medications, fluids and diet -IR has reevaluated patient on 10/7 and given significant air in the colon unable to proceed with percutaneous placement of gastrostomy tube therefore general surgery consulted -Surgery was going to take the patient down for a PEG but she had another Non-responsive episode so Surgery was cancelled  -During episode of increased awakeness on 10/5 SLP reevaluated patient but oral intake was inconsistent therefore recommended no change in current diet and to continue primary diet through feeding tube  Acute Urinary retention E Coli UTI -10/5 bladder scan w/ > 700 cc so I/O cath ordered; Has been getting I and O Caths and continues to Retain and had 574 this AM; Will place Foley  -cont q shift bladder scans -After I/O  catheterization on 10/7 nurses concerned over possible abnormal appearing urine therefore urinalysis and culture obtained.  Urinalysis was abnormal so on-call physician overnight begin empiric Rocephin -Urinalysis showed cloudy appearance with amber color urine, trace leukocytes, negative nitrates, present amorphous crystals, many bacteria, 0-5 squamous epithelial cells, 6-10 WBCs and urine culture grew out E. coli 100,000 colony-forming units that is pansensitive -Follow-up on urine culture  Mild Hypokalemia -Persistent issue but slightly improved -K+ is now 3.5 -Continue per tube replacement of potassium and will give 40 mEQ -Continue to Monitor and Replete as Necessary -Repeat CMP in the AM   Abnormal LFT's -? Related to AEDs -Trending down slowly -Since 10 3 patient's AST is significantly improved and went from 276 is now 71 and ALT went from 341 is now 141 -He does have a history of hepatitis C -Continue monitor trend and may obtain a right upper quadrant ultrasound -repeat CMP in a.m.  Ileus probably due to constipation -Several BM overnight. Abdomen soft. -Resume tube feeds -Continue bowel regimen (continue Senokot and scheduled MiraLAX)-lactulose added recently and with increased bowel movements- have decreased to once daily at bedtime -Repeat X-ray 10/5 continues to demonstrate significant amount of stool in colon -Completed magnesium citrate -10/7 Two view abdominal films revealed significant decrease stool burden distal colon but there is persistent diffuse gas filling of the small bowel and colon throughout the abdomen  Nutrition Status/moderate to severe protein calorie malnutrition: Nutrition Problem: Inadequate oral intake Etiology: lethargy/confusion Signs/Symptoms: meal completion < 25% Interventions: Tube feeding, Prostat -Estimated body mass index is 28.03 kg/m as calculated from the following:   Height as of this encounter:  (1.651 m).   Weight as of  this encounter: 76.4 kg.  Yeast vaginitis -Completed intravaginal Monistat  DVT prophylaxis: Enoxaparin 40 mg sq q24h Code Status: FULL  CODE Family Communication: No family present at bedside  Disposition Plan: Pending clinical improvement back to baseline and resolution of her encephalopathy  Status is: Inpatient  Remains inpatient appropriate because:Altered mental status and Inpatient level of care appropriate due to severity of illness   Dispo: The patient is from: Home              Anticipated d/c is to: TBD              Anticipated d/c date is: 3 days              Patient currently is not medically stable to d/c.  Consultants:   Neurology  Psychiatry  Interventional Radiology  Surgery   Procedures:   9/14 lumbar puncture  9/15 EEG  9/16 EEG  9/17 EEG  9/19 overnight EEG with video  9/22 overnight EEG with video with discontinuation of long-term EEG monitoring on 9/25  9/24 core track placement  10/6 EEG  Antimicrobials:  Anti-infectives (From admission, onward)   Start     Dose/Rate Route Frequency Ordered Stop   01/29/20 0600  ceFAZolin (ANCEF) IVPB 2g/100 mL premix        2 g 200 mL/hr over 30 Minutes Intravenous On call to O.R. 01/28/20 1511 01/30/20 0559   01/28/20 2000  cefTRIAXone (ROCEPHIN) 1 g in sodium chloride 0.9 % 100 mL IVPB        1 g 200 mL/hr over 30 Minutes Intravenous Every 24 hours 01/28/20 1925 02/02/20 1959   01/06/20 0900  cefTRIAXone (ROCEPHIN) 2 g in sodium chloride 0.9 % 100 mL IVPB  Status:  Discontinued        2 g 200 mL/hr over 30 Minutes Intravenous Every 12 hours 01/06/20 0105 01/06/20 2202   01/06/20 0800  vancomycin (VANCOCIN) IVPB 1000 mg/200 mL premix  Status:  Discontinued       "Followed by" Linked Group Details   1,000 mg 200 mL/hr over 60 Minutes Intravenous Every 12 hours 01/05/20 1904 01/06/20 2202   01/05/20 2000  vancomycin (VANCOREADY) IVPB 1500 mg/300 mL       "Followed by" Linked Group Details    1,500 mg 150 mL/hr over 120 Minutes Intravenous  Once 01/05/20 1904 01/06/20 0127   01/05/20 1830  cefTRIAXone (ROCEPHIN) 2 g in sodium chloride 0.9 % 100 mL IVPB        2 g 200 mL/hr over 30 Minutes Intravenous  Once 01/05/20 1829 01/05/20 2220       Subjective: Seen and examined and remains persistently catatonic and standing on the right side.  Does not follow commands arouse to verbal or physical physical stimuli.  Her lips were quivering and she did have some eye movement today nursing reports that she continued to have urinary retention so Foley catheter was placed.  Objective: Vitals:   01/30/20 0345 01/30/20 0500 01/30/20 0922 01/30/20 1626  BP: 128/86  (!) 146/90 (!) 162/104  Pulse: 85  88 87  Resp: 18  18 18   Temp: 98.6 F (37 C)  98.3 F (36.8 C) 98.1 F (36.7 C)  TempSrc: Axillary  Oral Oral  SpO2: 96%  97% 97%  Weight:  76.4 kg    Height:        Intake/Output Summary (Last 24 hours) at 01/30/2020 1726 Last data filed at 01/30/2020 1600 Gross per 24 hour  Intake 1279.17 ml  Output 818 ml  Net 461.17 ml   Filed Weights   01/28/20 0500 01/29/20 0334 01/30/20 0500  Weight: 73.4 kg 73.1 kg 76.4 kg   Examination: Physical Exam:  Constitutional: WN/WD overweight Caucasian female currently in no acute distress but she is in a catatonic state and she does appear calme Eyes: Spontaneously will move her eyes but is not purposeful and does not track ENMT: External Ears, Nose appear normal.  Cortrak in place Neck: Appears normal, supple, no cervical masses, normal ROM, no appreciable thyromegaly Respiratory:: Diminished to auscultation bilaterally, no wheezing, rales, rhonchi or crackles. Normal respiratory effort and patient is not tachypenic. No accessory muscle use.  Unlabored breathing Cardiovascular: RRR, no murmurs / rubs / gallops. S1 and S2 auscultated.  Minimal extremity edema Abdomen: Soft, non-tender, distended secondary to body habitus. No masses palpated. No  appreciable hepatosplenomegaly. Bowel sounds positive.  GU: Deferred. Musculoskeletal: No clubbing / cyanosis of digits/nails. No joint deformity upper and lower extremities.  Skin: No rashes, lesions, ulcers on limited skin evaluation. No induration; Warm and dry.  Neurologic: Does not follow commands and remains in a catatonic state. Psychiatric: Impaired judgment and insight.  She is not alert and oriented x 3.  Data Reviewed: I have personally reviewed following labs and imaging studies  CBC: Recent Labs  Lab 01/25/20 0308 01/28/20 0818 01/30/20 0259  WBC 3.6* 4.9 4.9  NEUTROABS  --  1.9 1.9  HGB 11.8* 13.4 12.0  HCT 34.4* 40.3 36.3  MCV 91.7 92.6 93.3  PLT 232 336 280   Basic Metabolic Panel: Recent Labs  Lab 01/25/20 0308 01/28/20 0818 01/30/20 0259  NA 138 138 137  K 3.3* 4.1 3.5  CL 103 102 104  CO2 28 24 24   GLUCOSE 136* 131* 159*  BUN 11 17 19   CREATININE 0.62 0.57 0.55  CALCIUM 8.7* 9.2 8.6*  MG  --  2.4 2.1  PHOS  --  4.2 3.3   GFR: Estimated Creatinine Clearance: 99.5 mL/min (by C-G formula based on SCr of 0.55 mg/dL). Liver Function Tests: Recent Labs  Lab 01/25/20 0308 01/27/20 0400 01/28/20 0818 01/30/20 0259  AST 276*  --  103* 71*  ALT 341*  --  213* 141*  ALKPHOS 75  --  78 68  BILITOT 0.6  --  0.5 0.2*  PROT 8.2*  --  8.9* 7.8  ALBUMIN 2.6* 3.1* 3.3* 2.9*   No results for input(s): LIPASE, AMYLASE in the last 168 hours. No results for input(s): AMMONIA in the last 168 hours. Coagulation Profile: Recent Labs  Lab 01/28/20 0516  INR 1.0   Cardiac Enzymes: No results for input(s): CKTOTAL, CKMB, CKMBINDEX, TROPONINI in the last 168 hours. BNP (last 3 results) No results for input(s): PROBNP in the last 8760 hours. HbA1C: No results for input(s): HGBA1C in the last 72 hours. CBG: Recent Labs  Lab 01/30/20 0006 01/30/20 0346 01/30/20 0758 01/30/20 1317 01/30/20 1627  GLUCAP 153* 169* 179* 92 162*   Lipid Profile: No  results for input(s): CHOL, HDL, LDLCALC, TRIG, CHOLHDL, LDLDIRECT in the last 72 hours. Thyroid Function Tests: No results for input(s): TSH, T4TOTAL, FREET4, T3FREE, THYROIDAB in the last 72 hours. Anemia Panel: No results for input(s): VITAMINB12, FOLATE, FERRITIN, TIBC, IRON, RETICCTPCT in the last 72 hours. Sepsis Labs: No results for input(s): PROCALCITON, LATICACIDVEN in the last 168 hours.  Recent Results (from the past 240 hour(s))  Culture, Urine     Status: Abnormal   Collection Time: 01/28/20  4:22 PM   Specimen: Urine, Random  Result Value Ref Range Status   Specimen Description URINE, RANDOM  Final   Special Requests   Final    NONE Performed at [facility], [address], [place], [place] [address]    Culture >=100,000 COLONIES/mL ESCHERICHIA COLI (A)  Final   Report Status 01/30/2020 FINAL  Final   Organism ID, Bacteria ESCHERICHIA COLI (A)  Final      Susceptibility   Escherichia coli - MIC*    AMPICILLIN <=2 SENSITIVE Sensitive     CEFAZOLIN <=4 SENSITIVE Sensitive     CEFTRIAXONE <=0.25 SENSITIVE Sensitive     CIPROFLOXACIN <=0.25 SENSITIVE Sensitive     GENTAMICIN <=1 SENSITIVE Sensitive     IMIPENEM <=0.25 SENSITIVE Sensitive     NITROFURANTOIN <=16 SENSITIVE Sensitive     TRIMETH/SULFA <=20 SENSITIVE Sensitive     AMPICILLIN/SULBACTAM <=2 SENSITIVE Sensitive     PIP/TAZO <=4 SENSITIVE Sensitive     * >=100,000 COLONIES/mL ESCHERICHIA COLI  Culture, Urine     Status: None   Collection Time: 01/29/20  5:08 PM   Specimen: Urine, Catheterized  Result Value Ref Range Status   Specimen Description URINE, CATHETERIZED  Final   Special Requests Normal  Final   Culture   Final    NO GROWTH Performed at [facility], [address], [place], [place] [address]    Report Status 01/30/2020 FINAL  Final     RN Pressure Injury Documentation:     Estimated body mass index is 28.03 kg/m as calculated from the following:   Height as of this  encounter: 5\' 5"  (1.651 m).   Weight as of this encounter: 76.4 kg.  Malnutrition Type:  Nutrition Problem: Inadequate oral intake Etiology: lethargy/confusion   Malnutrition Characteristics:  Signs/Symptoms: meal completion < 25%   Nutrition Interventions:  Interventions: Tube feeding, Prostat     Radiology Studies: No results found.  Scheduled Meds:  ALPRAZolam  0.25 mg Per Tube BID   amantadine  100 mg Per Tube BID   Chlorhexidine Gluconate Cloth  6 each Topical Daily   enoxaparin (LOVENOX) injection  40 mg Subcutaneous Q24H   famotidine  20 mg Per Tube Daily   feeding supplement (PROSource TF)  45 mL Per Tube Daily   free water  200 mL Per Tube Q6H   influenza vac split quadrivalent PF  0.5 mL Intramuscular Tomorrow-1000   insulin aspart  0-9 Units Subcutaneous TID WC   lacosamide  200 mg Per Tube BID   lactulose  20 g Per Tube QHS   mouth rinse  15 mL Mouth Rinse BID   methadone  60 mg Per Tube Daily   PHENObarbital  60 mg Per Tube BID   phenytoin  200 mg Per Tube BID   polyethylene glycol  17 g Per Tube Daily   vitamin B-6  100 mg Per Tube Daily   senna  1 tablet Per Tube QHS   sodium chloride flush  10-40 mL Intracatheter Q12H   thiamine  100 mg Per Tube Daily   Continuous Infusions:  sodium chloride 50 mL/hr at 01/30/20 0545   cefTRIAXone (ROCEPHIN)  IV Stopped (01/29/20 2052)   feeding supplement (OSMOLITE 1.2 CAL) 1,000 mL (01/29/20 1718)    LOS: 24 days   03/30/20, DO [facility] PAGER is on AMION  If 7PM-7AM, please contact night-coverage [email or web address]

## 2020-01-31 ENCOUNTER — Inpatient Hospital Stay (HOSPITAL_COMMUNITY): Payer: Medicaid Other

## 2020-01-31 LAB — GLUCOSE, CAPILLARY
Glucose-Capillary: 108 mg/dL — ABNORMAL HIGH (ref 70–99)
Glucose-Capillary: 110 mg/dL — ABNORMAL HIGH (ref 70–99)
Glucose-Capillary: 113 mg/dL — ABNORMAL HIGH (ref 70–99)
Glucose-Capillary: 158 mg/dL — ABNORMAL HIGH (ref 70–99)
Glucose-Capillary: 184 mg/dL — ABNORMAL HIGH (ref 70–99)
Glucose-Capillary: 95 mg/dL (ref 70–99)

## 2020-01-31 NOTE — Progress Notes (Signed)
PROGRESS NOTE    Anita Davis  ZOX:[id number] DOB: [birth year] DOA: 01/04/2020 PCP: [name], PA-C  Brief Narrative: The patient is a 34 year old Caucasian female with past medical history significant for not limited to substance abuse disorder, hepatitis C, history of gestational diabetes as well as other comorbidities who presented with confusion.  She is initially admitted back in September with encephalopathy thought to be from a drug overdose.  LP was done and showed no signs of infection.  MRI done and was relatively unremarkable.  She has remained encephalopathic and she has had multiple EEGs and EEG showed focal seizures.  The seizures were suppressed and she was transferred to comfort continues EEG but after her seizure control she has had for neurological recovery.  Initially autoimmune encephalitis was considered but the patient was started on high-dose steroids with some equivocal improvement.  She then further discontinued after steroids with her stop and IVIG has started.  She continues to be significantly encephalopathic and appears to be catatonic now.  Neurology actively following and will reconsult psychiatric treatment given that this could be a possible psychiatric induced catatonia would likely her encephalopathy is multifactorial.  Hospitalization has been complicated by a urinary tract infection so she has been started on IV Rocephin as well as complicated by acute urinary tension so Foley catheter has been placed.  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 -The specific etiology remains unclear, but the differential for this appears to be an autoimmune encephalitis, or post status epilepticus encephalopathy.  -TSH normal.  -Neuraxial imaging unremarkable.  -CT C/A/P shows no signs of cancer, paraneoplastic encephalitis seems unlikely.The clinical picture is not consistent with a true psychiatric mediated catatonia and is likely multifactorial, nor an opiate/alcohol withdrawal syndromeat this point (see below regarding psychiatry recommendations).  -Completed IVIG10/1. In last few days, has seemed more active, though still not interactive or attentive.  Upon review of neurology notes they have documented that it may take several weeks before full effects/potential improvement from IVIG seen.  Will need reevaluation with neurologist 6 weeks from 01/22/2020 to determine if any significant improvements or whether this will be patient's baseline mentation. -Continue phenobarbital, Vimpat, and Dilantin-Dilantin level 2.9 on 10/6 therefore Dilantin changed from 100 mg TID to 200 mg BID; neuro recommends gradual patient in discontinuation of AEDs after discharge if remains seizure-free -After discharge consider repeat EEG and MRI at follow-up in 6 to 8 weeks after discharge -Evaluated by psychiatry on 9/29 and reconsulted 10/8 -they added low-dose Seroquel 25 mg per tube twice daily in an attempt to improve mentation, aphasia, catatonic state -Continue empiric thiamine and vitamin B6 replacement -Of note patient has had waxing and waning mentation -on the afternoon of 10/5 ED was very alert, followed simple commands, was mostly oriented, had fluent speech although at times was perseverating. -10/6 patient noted with sxs concerning for possible seizure activity during OT session but EEG negative for seizure activity -10/7 Neuro dc'd Keppra since can worsen depression symptoms -See Below  Chronic depression in context of ongoing  polysubstance abuse prior to admission -The Nurse Practitioner [name] had  Long discussion with patient's aunt.  Patient had been struggling with her emotions and with ongoing substance abuse for years.  This had worsened over the past several months before admission.  Patient had recently lost custody of her children because of her substance abuse and in the month prior to admission she began after increasing daily use of methamphetamine. 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 -Currently on dysphagia 1 diet but given significant altered mentation, alertness and ability to participate with feeding plans are to transition to PEG tube to ensure adequate access for medications, fluids and diet -IR has reevaluated patient on 10/7 and given significant air in the colon unable to proceed with percutaneous placement of gastrostomy tube therefore general surgery consulted -Surgery was going to take the patient down for a PEG but she had another Non-responsive episode so Surgery was cancelled  -During episode of increased awakeness on 10/5 SLP reevaluated patient but oral intake was inconsistent therefore recommended no change in current diet and to continue primary diet through feeding tube -Nursing stated that patient started to choke on her tube feedings and that the centimeter mark on her core track has been changed.  Tube feedings have been discontinued and a stat x-ray was  obtained which showed the "Feeding tube is identified with distal tip probably just distal to the GE junction in the proximal stomach." -Nursing does not feel comfortable and advancing so we will hold tube feedings and have core track team to address in the morning and advance 5 to 10 cm and repeat another KUB after is obtained  Acute Urinary retention E Coli UTI -10/5 bladder scan w/ > 700 cc so I/O cath ordered; Has been getting I and O Caths and continues to Retain and had 574 this AM; Will place Foley  -cont q shift bladder scans -After I/O catheterization on 10/7 nurses concerned over possible abnormal appearing urine therefore urinalysis and culture obtained.  Urinalysis was abnormal so on-call physician overnight begin empiric Rocephin -Urinalysis showed cloudy appearance with amber color urine, trace leukocytes, negative nitrates, present amorphous crystals, many bacteria, 0-5 squamous epithelial cells, 6-10 WBCs and urine culture grew out E. coli 100,000 colony-forming units that is pansensitive -Continue treatment for at least 5 days  Mild Hypokalemia -Persistent issue but slightly improved -K+ is now 3.5 day -Continue per tube replacement of potassium and will give 40 mEQ times once yesterday -Continue to Monitor and Replete as Necessary -Repeat CMP in the AM   Abnormal LFT's -? Related to AEDs -Trending down slowly -Since 10 3 patient's AST is significantly improved and went from 276 is now 71 and ALT went from 341 is now 141 -He does have a history of hepatitis C -Continue monitor trend and may obtain a right upper quadrant ultrasound -repeat CMP intermittently  Ileus probably due to constipation -Several BM overnight. Abdomen soft. -Resume tube feeds -Continue bowel regimen (continue Senokot and scheduled MiraLAX)-lactulose added recently and with increased bowel movements- have decreased to once daily at bedtime -Repeat X-ray 10/5 continues to demonstrate significant  amount of stool  in colon -Completed magnesium citrate -10/7 Two view abdominal films revealed significant decrease stool burden distal colon but there is persistent diffuse gas filling of the small bowel and colon throughout the abdomen -We will obtain a KUB after Cortrak is advanced in the AM   Nutrition Status/moderate to severe protein calorie malnutrition: Nutrition Problem: Inadequate oral intake Etiology: lethargy/confusion Signs/Symptoms: meal completion < 25% Interventions: Tube feeding, Prostat; tube feedings to be held today given that she choked on them and that her Cortrak Tube needs to be advaced -Estimated body mass index is 26.19 kg/m as calculated from the following:   Height as of this encounter: 5\' 5"  (1.651 m).   Weight as of this encounter: 71.4 kg.  Yeast vaginitis -Completed intravaginal Monistat  DVT prophylaxis: Enoxaparin 40 mg sq q24h Code Status: FULL CODE Family Communication: No family present at bedside  Disposition Plan: Pending clinical improvement back to baseline and resolution of her encephalopathy  Status is: Inpatient  Remains inpatient appropriate because:Altered mental status and Inpatient level of care appropriate due to severity of illness   Dispo: The patient is from: Home              Anticipated d/c is to: TBD              Anticipated d/c date is: 3 days              Patient currently is not medically stable to d/c.  Consultants:   Neurology  Psychiatry  Interventional Radiology  Surgery   Procedures:   9/14 lumbar puncture  9/15 EEG  9/16 EEG  9/17 EEG  9/19 overnight EEG with video  9/22 overnight EEG with video with discontinuation of long-term EEG monitoring on 9/25  9/24 core track placement  10/6 EEG  Antimicrobials:  Anti-infectives (From admission, onward)   Start     Dose/Rate Route Frequency Ordered Stop   01/29/20 0600  ceFAZolin (ANCEF) IVPB 2g/100 mL premix        2 g 200 mL/hr over 30  Minutes Intravenous On call to O.R. 01/28/20 1511 01/30/20 0559   01/28/20 2000  cefTRIAXone (ROCEPHIN) 1 g in sodium chloride 0.9 % 100 mL IVPB        1 g 200 mL/hr over 30 Minutes Intravenous Every 24 hours 01/28/20 1925 02/02/20 1959   01/06/20 0900  cefTRIAXone (ROCEPHIN) 2 g in sodium chloride 0.9 % 100 mL IVPB  Status:  Discontinued        2 g 200 mL/hr over 30 Minutes Intravenous Every 12 hours 01/06/20 0105 01/06/20 2202   01/06/20 0800  vancomycin (VANCOCIN) IVPB 1000 mg/200 mL premix  Status:  Discontinued       "Followed by" Linked Group Details   1,000 mg 200 mL/hr over 60 Minutes Intravenous Every 12 hours 01/05/20 1904 01/06/20 2202   01/05/20 2000  vancomycin (VANCOREADY) IVPB 1500 mg/300 mL       "Followed by" Linked Group Details   1,500 mg 150 mL/hr over 120 Minutes Intravenous  Once 01/05/20 1904 01/06/20 0127   01/05/20 1830  cefTRIAXone (ROCEPHIN) 2 g in sodium chloride 0.9 % 100 mL IVPB        2 g 200 mL/hr over 30 Minutes Intravenous  Once 01/05/20 1829 01/05/20 2220      Subjective: Seen and examined and continues to remain persistently catatonic and she is staring off but not on the right side now.  Did not really respond with physical verbal stimuli.  Per nursing she started choking on her tube feeding so this was stopped and a stat chest x-ray showed that the Cortrak tube was discharged distal to the GE junction in the proximal stomach.  This will need to be advanced but nursing does not feel comfortable so we will have the dietitian team address in the morning and hold tube feedings today  Objective: Vitals:   01/31/20 0354 01/31/20 0417 01/31/20 0716 01/31/20 1201  BP: 129/86  (!) 163/87 (!) 163/90  Pulse: 89  75 78  Resp: 16  18 16   Temp: 98.5 F (36.9 C)  98 F (36.7 C) 98.3 F (36.8 C)  TempSrc: Oral  Axillary Axillary  SpO2: 98%  98% 98%  Weight:  71.4 kg    Height:        Intake/Output Summary (Last 24 hours) at 01/31/2020 1346 Last data  filed at 01/31/2020 0438 Gross per 24 hour  Intake 230 ml  Output 1350 ml  Net -1120 ml   Filed Weights   01/29/20 0334 01/30/20 0500 01/31/20 0417  Weight: 73.1 kg 76.4 kg 71.4 kg   Examination: Physical Exam:  Constitutional: WN/WD overweight Caucasian female currently in no acute distress but remains in a catatonic state and does not respond to physical verbal stimuli and remains calm Eyes: Lids and conjunctivae normal, sclerae anicteric  ENMT: External Ears, Nose appear normal. Cortrak in Right Nare Neck: Appears normal, supple, no cervical masses, normal ROM, no appreciable thyromegaly: No JVD Respiratory: Diminished to auscultation bilaterally, no wheezing, rales, rhonchi or crackles. Normal respiratory effort and patient is not tachypenic. No accessory muscle use.  Unlabored breathing Cardiovascular: RRR, no murmurs / rubs / gallops. S1 and S2 auscultated. No extremity edema. 2+ pedal pulses. No carotid bruits.  Abdomen: Soft, non-tender, distended secondary to body habitus. Bowel sounds positive.  GU: Deferred. Musculoskeletal: No clubbing / cyanosis of digits/nails. Normal strength and muscle tone.  Skin: No rashes, lesions, ulcers on to skin evaluation. No induration; Warm and dry.  Neurologic: Does not follow commands and remains in a catatonic state Psychiatric: Impaired judgment and insight.  She is not alert and oriented x 3. Normal mood and appropriate affect.   Data Reviewed: I have personally reviewed following labs and imaging studies  CBC: Recent Labs  Lab 01/25/20 0308 01/28/20 0818 01/30/20 0259  WBC 3.6* 4.9 4.9  NEUTROABS  --  1.9 1.9  HGB 11.8* 13.4 12.0  HCT 34.4* 40.3 36.3  MCV 91.7 92.6 93.3  PLT 232 336 280   Basic Metabolic Panel: Recent Labs  Lab 01/25/20 0308 01/28/20 0818 01/30/20 0259  NA 138 138 137  K 3.3* 4.1 3.5  CL 103 102 104  CO2 28 24 24   GLUCOSE 136* 131* 159*  BUN 11 17 19   CREATININE 0.62 0.57 0.55  CALCIUM 8.7* 9.2  8.6*  MG  --  2.4 2.1  PHOS  --  4.2 3.3   GFR: Estimated Creatinine Clearance: 96.4 mL/min (by C-G formula based on SCr of 0.55 mg/dL). Liver Function Tests: Recent Labs  Lab 01/25/20 0308 01/27/20 0400 01/28/20 0818 01/30/20 0259  AST 276*  --  103* 71*  ALT 341*  --  213* 141*  ALKPHOS 75  --  78 68  BILITOT 0.6  --  0.5 0.2*  PROT 8.2*  --  8.9* 7.8  ALBUMIN 2.6* 3.1* 3.3* 2.9*   No results for input(s): LIPASE, AMYLASE in the last 168 hours. No results for input(s): AMMONIA in the  last 168 hours. Coagulation Profile: Recent Labs  Lab 01/28/20 0516  INR 1.0   Cardiac Enzymes: No results for input(s): CKTOTAL, CKMB, CKMBINDEX, TROPONINI in the last 168 hours. BNP (last 3 results) No results for input(s): PROBNP in the last 8760 hours. HbA1C: No results for input(s): HGBA1C in the last 72 hours. CBG: Recent Labs  Lab 01/30/20 2008 01/31/20 0008 01/31/20 0356 01/31/20 0715 01/31/20 1200  GLUCAP 161* 110* 158* 184* 95   Lipid Profile: No results for input(s): CHOL, HDL, LDLCALC, TRIG, CHOLHDL, LDLDIRECT in the last 72 hours. Thyroid Function Tests: No results for input(s): TSH, T4TOTAL, FREET4, T3FREE, THYROIDAB in the last 72 hours. Anemia Panel: No results for input(s): VITAMINB12, FOLATE, FERRITIN, TIBC, IRON, RETICCTPCT in the last 72 hours. Sepsis Labs: No results for input(s): PROCALCITON, LATICACIDVEN in the last 168 hours.  Recent Results (from the past 240 hour(s))  Culture, Urine     Status: Abnormal   Collection Time: 01/28/20  4:22 PM   Specimen: Urine, Random  Result Value Ref Range Status   Specimen Description URINE, RANDOM  Final   Special Requests   Final    NONE Performed at [facility], [address] [place], [place] [address]    Culture >=100,000 COLONIES/mL ESCHERICHIA COLI (A)  Final   Report Status 01/30/2020 FINAL  Final   Organism ID, Bacteria ESCHERICHIA COLI (A)  Final      Susceptibility   Escherichia coli - MIC*     AMPICILLIN <=2 SENSITIVE Sensitive     CEFAZOLIN <=4 SENSITIVE Sensitive     CEFTRIAXONE <=0.25 SENSITIVE Sensitive     CIPROFLOXACIN <=0.25 SENSITIVE Sensitive     GENTAMICIN <=1 SENSITIVE Sensitive     IMIPENEM <=0.25 SENSITIVE Sensitive     NITROFURANTOIN <=16 SENSITIVE Sensitive     TRIMETH/SULFA <=20 SENSITIVE Sensitive     AMPICILLIN/SULBACTAM <=2 SENSITIVE Sensitive     PIP/TAZO <=4 SENSITIVE Sensitive     * >=100,000 COLONIES/mL ESCHERICHIA COLI  Culture, Urine     Status: None   Collection Time: 01/29/20  5:08 PM   Specimen: Urine, Catheterized  Result Value Ref Range Status   Specimen Description URINE, CATHETERIZED  Final   Special Requests Normal  Final   Culture   Final    NO GROWTH Performed at [facility], [address] [place], [place] [address]    Report Status 01/30/2020 FINAL  Final     RN Pressure Injury Documentation:     Estimated body mass index is 26.19 kg/m as calculated from the following:   Height as of this encounter:  (1.651 m).   Weight as of this encounter: 71.4 kg.  Malnutrition Type:  Nutrition Problem: Inadequate oral intake Etiology: lethargy/confusion   Malnutrition Characteristics:  Signs/Symptoms: meal completion < 25%   Nutrition Interventions:  Interventions: Tube feeding, Prostat     Radiology Studies: DG CHEST PORT 1 VIEW  Result Date: 01/31/2020 CLINICAL DATA:  Encounter for feeding tube placement. EXAM: PORTABLE CHEST 1 VIEW COMPARISON:  January 22, 2020 FINDINGS: The heart size and mediastinal contours are within normal limits. Both lungs are clear. Feeding tube is identified with distal tip probably just distal to the GE junction in the proximal stomach. The visualized skeletal structures are unremarkable. IMPRESSION: 1. No active cardiopulmonary disease. 2. Feeding tube is identified with distal tip probably just distal to the GE junction in the proximal stomach. Electronically Signed   By: Gabriel Carina.D.  On: 01/31/2020 12:02    Scheduled Meds: . ALPRAZolam  0.25 mg Per Tube BID  . amantadine  100 mg Per Tube BID  . Chlorhexidine Gluconate Cloth  6 each Topical Daily  . enoxaparin (LOVENOX) injection  40 mg Subcutaneous Q24H  . famotidine  20 mg Per Tube Daily  . feeding supplement (PROSource TF)  45 mL Per Tube Daily  . free water  200 mL Per Tube Q6H  . influenza vac split quadrivalent PF  0.5 mL Intramuscular Tomorrow-1000  . insulin aspart  0-9 Units Subcutaneous TID WC  . lacosamide  200 mg Per Tube BID  . lactulose  20 g Per Tube QHS  . mouth rinse  15 mL Mouth Rinse BID  . methadone  60 mg Per Tube Daily  . PHENObarbital  60 mg Per Tube BID  . phenytoin  200 mg Per Tube BID  . polyethylene glycol  17 g Per Tube Daily  . vitamin B-6  100 mg Per Tube Daily  . senna  1 tablet Per Tube QHS  . sodium chloride flush  10-40 mL Intracatheter Q12H  . thiamine  100 mg Per Tube Daily   Continuous Infusions: . sodium chloride 50 mL/hr at 01/30/20 1830  . cefTRIAXone (ROCEPHIN)  IV 1 g (01/30/20 2054)  . feeding supplement (OSMOLITE 1.2 CAL) Stopped (01/31/20 1059)    LOS: 25 days   Merlene Laughter, DO [facility] PAGER is on AMION  If 7PM-7AM, please contact night-coverage [email or web address]

## 2020-02-01 ENCOUNTER — Inpatient Hospital Stay (HOSPITAL_COMMUNITY): Payer: Medicaid Other

## 2020-02-01 LAB — MISC LABCORP TEST (SEND OUT): Labcorp test code: 9985

## 2020-02-01 LAB — PHOSPHORUS: Phosphorus: 3.4 mg/dL (ref 2.5–4.6)

## 2020-02-01 LAB — CBC WITH DIFFERENTIAL/PLATELET
Abs Immature Granulocytes: 0.02 10*3/uL (ref 0.00–0.07)
Basophils Absolute: 0 10*3/uL (ref 0.0–0.1)
Basophils Relative: 0 %
Eosinophils Absolute: 0 10*3/uL (ref 0.0–0.5)
Eosinophils Relative: 1 %
HCT: 39.3 % (ref 36.0–46.0)
Hemoglobin: 13.2 g/dL (ref 12.0–15.0)
Immature Granulocytes: 0 %
Lymphocytes Relative: 40 %
Lymphs Abs: 2.1 10*3/uL (ref 0.7–4.0)
MCH: 31 pg (ref 26.0–34.0)
MCHC: 33.6 g/dL (ref 30.0–36.0)
MCV: 92.3 fL (ref 80.0–100.0)
Monocytes Absolute: 0.3 10*3/uL (ref 0.1–1.0)
Monocytes Relative: 6 %
Neutro Abs: 2.8 10*3/uL (ref 1.7–7.7)
Neutrophils Relative %: 53 %
Platelets: 302 10*3/uL (ref 150–400)
RBC: 4.26 MIL/uL (ref 3.87–5.11)
RDW: 13.1 % (ref 11.5–15.5)
WBC: 5.3 10*3/uL (ref 4.0–10.5)
nRBC: 0 % (ref 0.0–0.2)

## 2020-02-01 LAB — GLUCOSE, CAPILLARY
Glucose-Capillary: 102 mg/dL — ABNORMAL HIGH (ref 70–99)
Glucose-Capillary: 108 mg/dL — ABNORMAL HIGH (ref 70–99)
Glucose-Capillary: 116 mg/dL — ABNORMAL HIGH (ref 70–99)
Glucose-Capillary: 120 mg/dL — ABNORMAL HIGH (ref 70–99)
Glucose-Capillary: 129 mg/dL — ABNORMAL HIGH (ref 70–99)
Glucose-Capillary: 137 mg/dL — ABNORMAL HIGH (ref 70–99)
Glucose-Capillary: 153 mg/dL — ABNORMAL HIGH (ref 70–99)

## 2020-02-01 LAB — COMPREHENSIVE METABOLIC PANEL
ALT: 123 U/L — ABNORMAL HIGH (ref 0–44)
AST: 80 U/L — ABNORMAL HIGH (ref 15–41)
Albumin: 3.1 g/dL — ABNORMAL LOW (ref 3.5–5.0)
Alkaline Phosphatase: 73 U/L (ref 38–126)
Anion gap: 9 (ref 5–15)
BUN: 8 mg/dL (ref 6–20)
CO2: 25 mmol/L (ref 22–32)
Calcium: 8.8 mg/dL — ABNORMAL LOW (ref 8.9–10.3)
Chloride: 101 mmol/L (ref 98–111)
Creatinine, Ser: 0.61 mg/dL (ref 0.44–1.00)
GFR, Estimated: >60 mL/min (ref >60)
Glucose, Bld: 125 mg/dL — ABNORMAL HIGH (ref 70–99)
Potassium: 3.5 mmol/L (ref 3.5–5.1)
Sodium: 135 mmol/L (ref 135–145)
Total Bilirubin: 0.7 mg/dL (ref 0.3–1.2)
Total Protein: 7.7 g/dL (ref 6.5–8.1)

## 2020-02-01 LAB — MAGNESIUM: Magnesium: 2 mg/dL (ref 1.7–2.4)

## 2020-02-01 MED ORDER — LORAZEPAM 2 MG/ML IJ SOLN
1.0000 mg | Freq: Once | INTRAMUSCULAR | Status: AC
  Administered 2020-02-01: 1 mg via INTRAVENOUS
  Filled 2020-02-01: qty 1

## 2020-02-01 MED ORDER — POTASSIUM CHLORIDE 20 MEQ/15ML (10%) PO SOLN
40.0000 meq | Freq: Every day | ORAL | Status: DC
  Administered 2020-02-01 – 2020-02-26 (×22): 40 meq
  Filled 2020-02-01 (×24): qty 30

## 2020-02-01 NOTE — Progress Notes (Signed)
PROGRESS NOTE    Anita Davis  OZD:[id number] DOB: 05/25/1985 DOA: 01/04/2020 PCP: [name], PA-C  Brief Narrative: The patient is a 34 year old Caucasian female with past medical history significant for not limited to substance abuse disorder, hepatitis C, history of gestational diabetes as well as other comorbidities who presented with confusion.  She is initially admitted back in September with encephalopathy thought to be from a drug overdose.  LP was done and showed no signs of infection.  MRI done and was relatively unremarkable.  She has remained encephalopathic and she has had multiple EEGs and EEG showed focal seizures.  The seizures were suppressed and she was transferred to comfort continues EEG but after her seizure control she has had for neurological recovery.  Initially autoimmune encephalitis was considered but the patient was started on high-dose steroids with some equivocal improvement.  She then further discontinued after steroids with her stop and IVIG has started.  She continues to be significantly encephalopathic and appears to be catatonic now.  Neurology actively following and will reconsult psychiatric treatment given that this could be a possible psychiatric induced catatonia would likely her encephalopathy is multifactorial.  Hospitalization has been complicated by a urinary tract infection so she has been started on IV Rocephin as well as complicated by acute urinary tension so Foley catheter has been placed.  Yesterday her cortrak was misplaced so tube feedings were stopped and dietitian was consulted.  Today Cortrak team advanced the tube and is currently in the right place now.  Will resume tube feedings.  SLP recommending continuing current plan of care.  Surgery still evaluating and awaiting psych evaluation.  Psych consult placed.  Patient actually answered some questions today but then refused to talk to me and likely this is a behavioral disturbance so we will  have psych further evaluate.  Neurology also believes that this is a psych related behavior and they had no other for neuro recommendations at this time.  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 -The specific etiology remains unclear, but the differential for this appears to be an autoimmune encephalitis, or post status epilepticus encephalopathy.  -TSH normal.  -Neuraxial imaging unremarkable.  -CT C/A/P shows no signs of cancer, paraneoplastic encephalitis seems unlikely.Initially felt the clinical picture is not consistent with a true psychiatric mediated catatonia and is likely multifactorial, nor an opiate/alcohol withdrawal syndromeat this point (see below regarding psychiatry recommendations).  -Completed IVIG10/1. In last few days, has seemed more active, though still not interactive or attentive.  Upon review of neurology notes they have documented that it may take several weeks before full effects/potential improvement from IVIG seen.  Will need reevaluation with neurologist 6 weeks from 01/22/2020 to determine if any significant improvements or whether this will be patient's baseline mentation. -Continue phenobarbital, Vimpat, and Dilantin-Dilantin level 2.9 on 10/6 therefore Dilantin changed from 100 mg TID to 200 mg BID; neuro recommends gradual patient in discontinuation of AEDs after discharge if remains seizure-free -After discharge consider repeat EEG and MRI at follow-up in 6 to 8 weeks after discharge -Evaluated by psychiatry on 9/29 and  reconsulted  10/8 -they added low-dose Seroquel 25 mg per tube twice daily in an attempt to improve mentation, aphasia, catatonic state -Continue empiric thiamine and vitamin B6 replacement -Of note patient has had waxing and waning mentation -on the afternoon of 10/5 ED was very alert, followed simple commands, was mostly oriented, had fluent speech although at times was perseverating. -10/6 patient noted with sxs concerning for possible seizure activity during OT session but EEG negative for seizure activity -10/7 Neuro dc'd Keppra since can worsen depression symptoms -See Below -10/11 patient x-ray responded somewhat and is no longer staring but states that "she does not feel good" and then stopped answering questions when asked and is very withdrawn  Chronic depression in context of ongoing polysubstance abuse prior to admission -The Nurse Practitioner [name] had Long discussion with patient's aunt.  Patient had been struggling with her emotions and with ongoing substance abuse for years.  This had worsened over the past several months before admission.  Patient had recently lost custody of her children because of her substance abuse and in the month prior to admission she began after increasing daily use of methamphetamine. 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 -Currently on dysphagia 1 diet but given  significant altered mentation, alertness and ability to participate with feeding plans are to transition to PEG tube to ensure adequate access for medications, fluids and diet -IR has reevaluated patient on 10/7 and given significant air in the colon unable to proceed with percutaneous placement of gastrostomy tube therefore general surgery consulted -Surgery was going to take the patient down for a PEG but she had another Non-responsive episode so Surgery was cancelled  -During episode of increased awakeness on 10/5 SLP reevaluated patient but oral intake was inconsistent therefore recommended no change in current diet and to continue primary diet through feeding tube -Nursing stated that patient started to choke on her tube feedings and that the centimeter mark on her core track has been changed.  Tube feedings have been discontinued and a stat x-ray was obtained which showed the "Feeding tube is identified with distal tip probably just distal to the GE junction in the proximal stomach." -Nursing did not feel comfortable and advancing so we will hold tube feedings and have core track team to address in the morning and advance 5 to 10 cm and repeat another KUB after is obtained; the dietitian has advanced the Cortrak Tube and is currently in the right position determined by x-ray so we will resume her tube feedings.  SLP recommends continuing current plan of care.  Surgery still holding off on the gastrostomy tube as they want a psychiatric evaluation first  Acute Urinary retention E Coli UTI -10/5 bladder scan w/ > 700 cc so I/O cath ordered; Has been getting I and O Caths and continues to Retain and had 574 this AM; Will place Foley  -cont q shift bladder scans -After I/O catheterization on 10/7 nurses concerned over possible abnormal appearing urine therefore urinalysis and culture obtained.  Urinalysis was abnormal so on-call  physician overnight begin empiric Rocephin -Urinalysis showed cloudy  appearance with amber color urine, trace leukocytes, negative nitrates, present amorphous crystals, many bacteria, 0-5 squamous epithelial cells, 6-10 WBCs and urine culture grew out E. coli 100,000 colony-forming units that is pansensitive -Continue treatment for at least 5 days we will continue IV ceftriaxone  Mild Hypokalemia -Persistent issue but slightly improved -K+ is now 3.5 day today and will replete again as below -Continue per tube replacement of potassium and will give 40 mEQ times once yesterday -Continue to Monitor and Replete as Necessary -Repeat CMP in the AM   Abnormal LFT's -? Related to AEDs -Trending down slowly -Since 10 3 patient's AST is significantly improved and went from 276 is now 80 and ALT went from 341 is now 123 -He does have a history of hepatitis C -Continue monitor trend and may obtain a right upper quadrant ultrasound -repeat CMP intermittently  Ileus probably due to constipation -Several BM overnight. Abdomen soft. -Resume tube feeds -Continue bowel regimen (continue Senokot and scheduled MiraLAX)-lactulose added recently and with increased bowel movements- have decreased to once daily at bedtime -Repeat X-ray 10/5 continues to demonstrate significant amount of stool in colon -Completed magnesium citrate -10/7 Two view abdominal films revealed significant decrease stool burden distal colon but there is persistent diffuse gas filling of the small bowel and colon throughout the abdomen -We will obtain a KUB after Cortrak is advanced in the AM   Nutrition Status/moderate to severe protein calorie malnutrition: Nutrition Problem: Inadequate oral intake Etiology: lethargy/confusion Signs/Symptoms: meal completion < 25% Interventions: Tube feeding, Prostat; tube feedings to be held today given that she choked on them and that her Cortrak Tube needs to be advaced; this has been done and KUB confirms correct placement of the tube so we will resume her  tube feedings -Estimated body mass index is 25.35 kg/m as calculated from the following:   Height as of this encounter:  (1.651 m).   Weight as of this encounter: 69.1 kg.  Yeast vaginitis -Completed intravaginal Monistat  DVT prophylaxis: Enoxaparin 40 mg sq q24h Code Status: FULL CODE Family Communication: No family present at bedside  Disposition Plan: Pending clinical improvement back to baseline and resolution of her encephalopathy; she was able to verbalize to me today but then stopped talking  Status is: Inpatient  Remains inpatient appropriate because:Altered mental status and Inpatient level of care appropriate due to severity of illness   Dispo: The patient is from: Home              Anticipated d/c is to: TBD              Anticipated d/c date is: 3 days              Patient currently is not medically stable to d/c.  Consultants:   Neurology  Psychiatry  Interventional Radiology  Surgery   Procedures:   9/14 lumbar puncture  9/15 EEG  9/16 EEG  9/17 EEG  9/19 overnight EEG with video  9/22 overnight EEG with video with discontinuation of long-term EEG monitoring on 9/25  9/24 core track placement  10/6 EEG  Antimicrobials:  Anti-infectives (From admission, onward)   Start     Dose/Rate Route Frequency Ordered Stop   01/29/20 0600  ceFAZolin (ANCEF) IVPB 2g/100 mL premix        2 g 200 mL/hr over 30 Minutes Intravenous On call to O.R. 01/28/20 1511 01/30/20 0559   01/28/20 2000  cefTRIAXone (  ROCEPHIN) 1 g in sodium chloride 0.9 % 100 mL IVPB        1 g 200 mL/hr over 30 Minutes Intravenous Every 24 hours 01/28/20 1925 02/02/20 1959   01/06/20 0900  cefTRIAXone (ROCEPHIN) 2 g in sodium chloride 0.9 % 100 mL IVPB  Status:  Discontinued        2 g 200 mL/hr over 30 Minutes Intravenous Every 12 hours 01/06/20 0105 01/06/20 2202   01/06/20 0800  vancomycin (VANCOCIN) IVPB 1000 mg/200 mL premix  Status:  Discontinued       "Followed by"  Linked Group Details   1,000 mg 200 mL/hr over 60 Minutes Intravenous Every 12 hours 01/05/20 1904 01/06/20 2202   01/05/20 2000  vancomycin (VANCOREADY) IVPB 1500 mg/300 mL       "Followed by" Linked Group Details   1,500 mg 150 mL/hr over 120 Minutes Intravenous  Once 01/05/20 1904 01/06/20 0127   01/05/20 1830  cefTRIAXone (ROCEPHIN) 2 g in sodium chloride 0.9 % 100 mL IVPB        2 g 200 mL/hr over 30 Minutes Intravenous  Once 01/05/20 1829 01/05/20 2220      Subjective: Seen and examined and she was much more awake today than she was yesterday and was not staring off.  I asked her how she is feeling today and she states "I do not feel good" but then was not able to tell me much else and stop talking to me.  She continues to be minimally interactive but compared to last week this was the most that she interacted with me this past week.  Nursing reports no other acute changes in pain tube is in the right position so we will resume her tube feedings.  No other concerns or complaints at this time.  Objective: Vitals:   02/01/20 0351 02/01/20 0445 02/01/20 0803 02/01/20 1200  BP: (!) 151/93  (!) 122/91 (!) 141/77  Pulse: 75  (!) 106 79  Resp: 18  18 18   Temp: 98.9 F (37.2 C)  98.5 F (36.9 C) 98.1 F (36.7 C)  TempSrc: Oral  Oral Oral  SpO2: 100%  99% 99%  Weight:  69.1 kg    Height:        Intake/Output Summary (Last 24 hours) at 02/01/2020 1423 Last data filed at 02/01/2020 0530 Gross per 24 hour  Intake --  Output 2425 ml  Net -2425 ml   Filed Weights   01/30/20 0500 01/31/20 0417 02/01/20 0445  Weight: 76.4 kg 71.4 kg 69.1 kg   Examination: Physical Exam:  Constitutional: WN/WD overweight Caucasian female currently no acute distress appears improved today and did answer some questions but then stopped talking to me.  She remains calm  Eyes: Lids and conjunctivae normal, sclerae anicteric  ENMT: External Ears, Nose appear normal. Grossly normal hearing.  Neck:  Appears normal, supple, no cervical masses, normal ROM, no appreciable thyromegaly; no JVD Respiratory: Diminished to auscultation bilaterally, no wheezing, rales, rhonchi or crackles. Normal respiratory effort and patient is not tachypenic. No accessory muscle use.  Unlabored breathing Cardiovascular: RRR, no murmurs / rubs / gallops. S1 and S2 auscultated. No extremity edema.  Abdomen: Soft, non-tender, distended secondary to body habitus. No masses palpated. No appreciable hepatosplenomegaly. Bowel sounds positive.  GU: Deferred. Musculoskeletal: No clubbing / cyanosis of digits/nails. No joint deformity upper and lower extremities.   Skin: No rashes, lesions, ulcers on limited skin evaluation. No induration; Warm and dry.  Neurologic: She is not  in a catatonic state today and actually tracked me with her eyes today and answered my questions but then stopped talking to me.  Does not want to follow commands Psychiatric: Impaired judgment and insight.  She is awake but not alert and oriented x 3. Normal mood and appropriate affect.   Data Reviewed: I have personally reviewed following labs and imaging studies  CBC: Recent Labs  Lab 01/28/20 0818 01/30/20 0259 02/01/20 0107  WBC 4.9 4.9 5.3  NEUTROABS 1.9 1.9 2.8  HGB 13.4 12.0 13.2  HCT 40.3 36.3 39.3  MCV 92.6 93.3 92.3  PLT 336 280 302   Basic Metabolic Panel: Recent Labs  Lab 01/28/20 0818 01/30/20 0259 02/01/20 0107  NA 138 137 135  K 4.1 3.5 3.5  CL 102 104 101  CO2 GLUCOSE 131* 159* 125*  BUN CREATININE 0.57 0.55 0.61  CALCIUM 9.2 8.6* 8.8*  MG 2.4 2.1 2.0  PHOS 4.2 3.3 3.4   GFR: Estimated Creatinine Clearance: 94.8 mL/min (by C-G formula based on SCr of 0.61 mg/dL). Liver Function Tests: Recent Labs  Lab 01/27/20 0400 01/28/20 0818 01/30/20 0259 02/01/20 0107  AST  --  103* 71* 80*  ALT  --  213* 141* 123*  ALKPHOS  --  78 68 73  BILITOT  --  0.5 0.2* 0.7  PROT  --  8.9* 7.8 7.7   ALBUMIN 3.1* 3.3* 2.9* 3.1*   No results for input(s): LIPASE, AMYLASE in the last 168 hours. No results for input(s): AMMONIA in the last 168 hours. Coagulation Profile: Recent Labs  Lab 01/28/20 0516  INR 1.0   Cardiac Enzymes: No results for input(s): CKTOTAL, CKMB, CKMBINDEX, TROPONINI in the last 168 hours. BNP (last 3 results) No results for input(s): PROBNP in the last 8760 hours. HbA1C: No results for input(s): HGBA1C in the last 72 hours. CBG: Recent Labs  Lab 01/31/20 1923 01/31/20 2348 02/01/20 0354 02/01/20 0804 02/01/20 1200  GLUCAP 108* 116* 120* 102* 108*   Lipid Profile: No results for input(s): CHOL, HDL, LDLCALC, TRIG, CHOLHDL, LDLDIRECT in the last 72 hours. Thyroid Function Tests: No results for input(s): TSH, T4TOTAL, FREET4, T3FREE, THYROIDAB in the last 72 hours. Anemia Panel: No results for input(s): VITAMINB12, FOLATE, FERRITIN, TIBC, IRON, RETICCTPCT in the last 72 hours. Sepsis Labs: No results for input(s): PROCALCITON, LATICACIDVEN in the last 168 hours.  Recent Results (from the past 240 hour(s))  Culture, Urine     Status: Abnormal   Collection Time: 01/28/20  4:22 PM   Specimen: Urine, Random  Result Value Ref Range Status   Specimen Description URINE, RANDOM  Final   Special Requests   Final    NONE Performed at [facility], [address] [place], [place] [address]    Culture >=100,000 COLONIES/mL ESCHERICHIA COLI (A)  Final   Report Status 01/30/2020 FINAL  Final   Organism ID, Bacteria ESCHERICHIA COLI (A)  Final      Susceptibility   Escherichia coli - MIC*    AMPICILLIN <=2 SENSITIVE Sensitive     CEFAZOLIN <=4 SENSITIVE Sensitive     CEFTRIAXONE <=0.25 SENSITIVE Sensitive     CIPROFLOXACIN <=0.25 SENSITIVE Sensitive     GENTAMICIN <=1 SENSITIVE Sensitive     IMIPENEM <=0.25 SENSITIVE Sensitive     NITROFURANTOIN <=16 SENSITIVE Sensitive     TRIMETH/SULFA <=20 SENSITIVE Sensitive     AMPICILLIN/SULBACTAM <=2  SENSITIVE Sensitive     PIP/TAZO <=  4 SENSITIVE Sensitive     * >=100,000 COLONIES/mL ESCHERICHIA COLI  Culture, Urine     Status: None   Collection Time: 01/29/20  5:08 PM   Specimen: Urine, Catheterized  Result Value Ref Range Status   Specimen Description URINE, CATHETERIZED  Final   Special Requests Normal  Final   Culture   Final    NO GROWTH Performed at [facility], [address] [place], [place] [address]    Report Status 01/30/2020 FINAL  Final     RN Pressure Injury Documentation:     Estimated body mass index is 25.35 kg/m as calculated from the following:   Height as of this encounter: 5\' 5"  (1.651 m).   Weight as of this encounter: 69.1 kg.  Malnutrition Type:  Nutrition Problem: Inadequate oral intake Etiology: lethargy/confusion   Malnutrition Characteristics:  Signs/Symptoms: meal completion < 25%   Nutrition Interventions:  Interventions: Tube feeding, Prostat     Radiology Studies: DG CHEST PORT 1 VIEW  Result Date: 01/31/2020 CLINICAL DATA:  Encounter for feeding tube placement. EXAM: PORTABLE CHEST 1 VIEW COMPARISON:  January 22, 2020 FINDINGS: The heart size and mediastinal contours are within normal limits. Both lungs are clear. Feeding tube is identified with distal tip probably just distal to the GE junction in the proximal stomach. The visualized skeletal structures are unremarkable. IMPRESSION: 1. No active cardiopulmonary disease. 2. Feeding tube is identified with distal tip probably just distal to the GE junction in the proximal stomach. Electronically Signed   By: Sherian ReinWei-Chen  Lin M.D.   On: 01/31/2020 12:02   DG Abd Portable 1V  Result Date: 02/01/2020 CLINICAL DATA:  Evaluate feeding tube placement EXAM: PORTABLE ABDOMEN - 1 VIEW COMPARISON:  01/28/2020 FINDINGS: The tip of the feeding tube is well below the level of the GE junction in the expected location of the proximal duodenum/pylorus. IMPRESSION: Feeding tube tip in the  proximal duodenum/pylorus. Electronically Signed   By: Signa Kellaylor  Stroud M.D.   On: 02/01/2020 09:59    Scheduled Meds: . ALPRAZolam  0.25 mg Per Tube BID  . amantadine  100 mg Per Tube BID  . Chlorhexidine Gluconate Cloth  6 each Topical Daily  . enoxaparin (LOVENOX) injection  40 mg Subcutaneous Q24H  . famotidine  20 mg Per Tube Daily  . feeding supplement (PROSource TF)  45 mL Per Tube Daily  . free water  200 mL Per Tube Q6H  . influenza vac split quadrivalent PF  0.5 mL Intramuscular Tomorrow-1000  . insulin aspart  0-9 Units Subcutaneous TID WC  . lacosamide  200 mg Per Tube BID  . lactulose  20 g Per Tube QHS  . mouth rinse  15 mL Mouth Rinse BID  . methadone  60 mg Per Tube Daily  . PHENObarbital  60 mg Per Tube BID  . phenytoin  200 mg Per Tube BID  . polyethylene glycol  17 g Per Tube Daily  . vitamin B-6  100 mg Per Tube Daily  . senna  1 tablet Per Tube QHS  . sodium chloride flush  10-40 mL Intracatheter Q12H  . thiamine  100 mg Per Tube Daily   Continuous Infusions: . sodium chloride 50 mL/hr at 01/31/20 1736  . cefTRIAXone (ROCEPHIN)  IV 1 g (01/31/20 2138)  . feeding supplement (OSMOLITE 1.2 CAL) Stopped (01/31/20 1059)    LOS: 26 days   Merlene Laughtermair Latif Wister Hoefle, DO [facility] PAGER is on AMION  If 7PM-7AM, please contact night-coverage  [email or web address]

## 2020-02-01 NOTE — Progress Notes (Signed)
Cortrak Tube Team Note:  Cortrak team consulted to advance Cortrak. RD advanced tube and secured via bridle at 63cm. Will obtain KUB per MD instructions to confirm placement.    X-ray is required, abdominal x-ray has been ordered by the Cortrak team. Please confirm tube placement before using the Cortrak tube.   If the tube becomes dislodged please keep the tube and contact the Cortrak team at [email or web address] (password [email or web address]) for replacement.  If after hours and replacement cannot be delayed, place a NG tube and confirm placement with an abdominal x-ray.    Eugene Gavia, MS, RD, LDN RD pager number and weekend/on-call pager number located in [place].

## 2020-02-01 NOTE — Progress Notes (Signed)
Subjective: No acute events overnight.  ROS: Unable to obtain due to poor mental status  Examination  Vital signs in last 24 hours: Temp:  [98.1 F (36.7 C)-98.9 F (37.2 C)] 98.1 F (36.7 C) (10/11 1200) Pulse Rate:  [75-106] 79 (10/11 1200) Resp:  [16-18] 18 (10/11 1200) BP: (122-168)/(77-93) 141/77 (10/11 1200) SpO2:  [98 %-100 %] 99 % (10/11 1200) Weight:  [69.1 kg] 69.1 kg (10/11 0445)  General:Sitting in recliner, not in apparent distress CVS: pulse-normal rate and rhythm RS: breathing comfortably Extremities: normal,warm Neuro:Awake, alert,said "I am okay" but did not answer any further questions, not following commands still, cranial nerves II to XIIare grossly intact, spontaneously moving all 4 extremities  Basic Metabolic Panel: Recent Labs  Lab 01/28/20 0818 01/30/20 0259 02/01/20 0107  NA 138 137 135  K 4.1 3.5 3.5  CL 102 104 101  CO2 24 24 25   GLUCOSE 131* 159* 125*  BUN 17 19 8   CREATININE 0.57 0.55 0.61  CALCIUM 9.2 8.6* 8.8*  MG 2.4 2.1 2.0  PHOS 4.2 3.3 3.4    CBC: Recent Labs  Lab 01/28/20 0818 01/30/20 0259 02/01/20 0107  WBC 4.9 4.9 5.3  NEUTROABS 1.9 1.9 2.8  HGB 13.4 12.0 13.2  HCT 40.3 36.3 39.3  MCV 92.6 93.3 92.3  PLT 336 280 302     Coagulation Studies: No results for input(s): LABPROT, INR in the last 72 hours.  Imaging No new brain imaging overnight.  ASSESSMENT AND PLAN: 34 year old female who initially presented to [facility] on 01/06/2020 with encephalopathy. 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 Keppra was started and eventually Vimpat was added with improvement in EEG but patient continued to be altered and therefore transferred to [facility]. On arrival, EEG showed sharp waves in left central temporal region as well as lateralized rhythmic delta activity in left hemisphere which improved after adding phenytoin and therefore was most likely ictal in  nature. Since then phenobarbital also been added with further improvement in EEG (no more sharp waves). Of note, patient underwent lumbar puncture at [facility] on 01/05/2020 which did not show any pleocytosis and had normal protein and glucose. 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 During this whole time, patient has had fluctuating mental status, at 1 point she was able to follow commands and say simple words but has since had worsening again and is currently nonverbal andnot following commands.She received 5 days of IVIG, completed on 01/22/2020. On 01/26/2020 shestarted to speak a few words again. Unfortunatelysince10/09/2019, she again appeared to be nonverbal.  Today patient again said I am okay but did not follow any further commands.  Encephalopathy Nonconvulsive status epilepticus (resolved) Polysubstance abuse - Differentials include autoimmune/paraneoplastic encephalitisversus less likelyprolonged encephalopathy secondary to substance abuse/postictal state, psychiatric etiology -At this point, patient has been inpatient for almost 4 weeks. She had seizures on her presentation initially which have since resolved. LP and MRI have been negative for any acute abnormality. She has received IV steroids and IVIG without any significant change in mental status.  She has had episodes where she is able to fluently speak and answer questions and then suddenly becomes nonverbal again. There was no change in patient's management prior to these episodes where she was able to speak and again before she became nonverbal. -Due to repeated waxing and waning mental status, I suspect that patient has neurologic as well as psychiatric basis  of current nonverbal state.   Recommendations -Continue Vimpat200 mg twice daily and phenobarb 65 mg twice daily, phenytoin 200 mg twice daily -Continueamantadine 100 mg twice dailyfor neuro  stimulation -Recommend psychiatry consult to assess for psychologic causes for patient's nonverbal state -Recommend follow-up with neurology in 6 to 8 weeks. Might also consider repeat EEG and MRI brain at that time. -Can likely be weaned off of at least1-2antiseizure medications as an outpatient if continues to remain seizure-free. -Continue seizure precautions -As needed IV Ativan 2 mg for clinical seizure-like activity -Management of rest of comorbidities per primary team -No further inpatient management from neurology standpoint  Thank you for allowing [place] to participate in the care of this patient.  Neurology will sign off.  Please page or contact us if you have any further questions  I have spent a total of65minuteswith the patient reviewing hospitalnotes, test results, labs and examining the patient as well as establishing an assessment and plan.>50% of time was spent in direct patient care.      Lindie Spruce Epilepsy [facility] For questions after 5pm please refer to AMION to reach the Neurologist on call

## 2020-02-01 NOTE — Progress Notes (Signed)
Physical Therapy Treatment [name] MRN: [id number] DOB: May 29, 1985 Today's Date: 02/01/2020    History of Present Illness 34 year old with past medical history significant for hepatitis C, polysubstance abuse brought to the hospital 9/13 for disorientation which was suspected to be substance abuse.  After she became more lucid, she was discharged, but then police brought her back later in the day and they found her passed out in a parking lot.  Urine drug screen was positive for benzodiazepine and THC. An EEG done on 9/15 showed patient was in status epilepticus.  Patient was a started on Keppra.  Despite these, EEG noted continued seizures requiring additional medications as well.  Finally on 918, seizures broke. Follow-up EEG on 9/20 noted evidence of epileptic [name] from the left central temporal region.  Repeat EEG done on 9/22 noted epileptogenic city from the left central temporal region.    PT Comments    Pt making significant progress towards her goals this date, with goals being updated as a result. She was able to transition supine > sit EOB with the HOB elevated with minA, suggesting improved strength and coordination. She required minA during STS transfers, but displayed difficulty following directions and remembering the cues as she was cued multiple times for proper hand placement without success. When ambulating ~20 ft bouts with a RW she was unaware/inattentive to her surroundings, resulting in her bumping or getting the RW stuck on obstacles. She was unable to fix the situation despite cues and required assistance to manage the RW, with her attempting to push the RW over at the end of the session. She displayed unsteadiness and occasional L lateral or posterior bouts of LOB, requiring modA to recover. During the session, she would randomly become agitated and talk like someone else was in the room. Secondary to her significant improvement, young age, ability  to tolerate inc therapy time, and vast difference between her current level of function and her PLOF CIR is recommended at this time. Will continue to follow-up with acute PT services to address her deficits to maximize her safety and independence with all functional mobility.   Follow Up Recommendations  Supervision/Assistance - 24 hour;CIR     Equipment Recommendations  Rolling walker with 5" wheels;Wheelchair (measurements PT);Wheelchair cushion (measurements PT);3in1 (PT) (w/c and ambulatory device depends on pt's progress)    Recommendations for Other Services Rehab consult     Precautions / Restrictions Precautions Precautions: Fall Precaution Comments: seizure, cortrack, wrist and mitten restraints, NG tube, foley catheter Restrictions Weight Bearing Restrictions: No    Mobility  Bed Mobility Overal bed mobility: Needs Assistance Bed Mobility: Supine to Sit;Sit to Supine     Supine to sit: HOB elevated;Min assist Sit to supine: HOB elevated;Min assist   General bed mobility comments: Pt able to transition supine <> sit R EOB with HOB elevated and VC's for LE management and HHA on L UE to ascend trunk.  Transfers Overall transfer level: Needs assistance Equipment used: Rolling walker (2 wheeled) Transfers: Sit to/from Stand Sit to Stand: Min assist         General transfer comment: STS from EOB and toilet, VCs and TCs repeatedly for hand placement on bed or bathroom rail rather than on RW for safety, unsteadiness noted upon coming to stand.  Ambulation/Gait Ambulation/Gait assistance: Mod assist Gait Distance (Feet): 40 Feet (bouts of 20 ft x2 with seated rest break between bouts) Assistive device: Rolling walker (2 wheeled) Gait Pattern/deviations: Step-through pattern;Decreased stride length;Staggering left;Narrow  base of support Gait velocity: decreased Gait velocity interpretation: <1.31 ft/sec, indicative of household ambulator General Gait Details:  Unsteadiness noted with gait, requiring continual VCs for line management and TCs and assistance to manage RW as she tends to bump or get stuck on obstacles and is unable to correct safely. At end of final gait bout, pt tipped RW over prior to sitting on EOB and required PT to catch RW to maintain safety. 3 LOB bouts towards L or posteriorly, requiring modA to recover.    Stairs             Wheelchair Mobility    Modified Rankin (Stroke Patients Only)       Balance Overall balance assessment: Needs assistance Sitting-balance support: No upper extremity supported;Feet supported Sitting balance-Leahy Scale: Fair Sitting balance - Comments: Able to sit EOB without LOB, but trunk flexed and requiring cues to maintain upright posture.   Standing balance support: Bilateral upper extremity supported;During functional activity Standing balance-Leahy Scale: Poor Standing balance comment: ModA-min guard for standing balance due to pt lacking safety awareness and requiring cues for hand placement, unsteadiness noted.                            Cognition Arousal/Alertness: Awake/alert Behavior During Therapy: Agitated Overall Cognitive Status: No family/caregiver present to determine baseline cognitive functioning                                 General Comments: Pt requires inc time and encouragement to perform tasks with repeated cues. Pt would randomly begin to get upset about some person involving her medical decisions and seemed like she was talking to someone when there was no one there. Oriented to person but no place or date or situation.      Exercises      General Comments        Pertinent Vitals/Pain Pain Assessment: Faces Pain Score: 8  Faces Pain Scale: No hurt Pain Location: stomach and back Pain Descriptors / Indicators: Crying;Discomfort;Grimacing Pain Intervention(s): Limited activity within patient's tolerance;Monitored during  session;Patient requesting pain meds-RN notified    Home Living                      Prior Function            PT Goals (current goals can now be found in the care plan section) Acute Rehab PT Goals Patient Stated Goal: Pt shook head when asked to state any personal goals PT Goal Formulation: Patient unable to participate in goal setting Potential to Achieve Goals: Fair Progress towards PT goals: Progressing toward goals;Goals met and updated - see care plan    Frequency    Min 3X/week      PT Plan Current plan remains appropriate    Co-evaluation              AM-PAC PT "6 Clicks" Mobility   Outcome Measure  Help needed turning from your back to your side while in a flat bed without using bedrails?: A Little Help needed moving from lying on your back to sitting on the side of a flat bed without using bedrails?: A Little Help needed moving to and from a bed to a chair (including a wheelchair)?: A Lot Help needed standing up from a chair using your arms (e.g., wheelchair or bedside chair)?: A Little Help  needed to walk in hospital room?: A Lot Help needed climbing 3-5 steps with a railing? : Total 6 Click Score: 14    End of Session Equipment Utilized During Treatment: Gait belt Activity Tolerance: Treatment limited secondary to medical complications (Comment) (pt confused and agitated randomly at times) Patient left: in bed;with call bell/phone within reach;with bed alarm set Nurse Communication: Mobility status;Patient requests pain meds;Other (comment) (currently did not utilize restraints as pt was not pulling) PT Visit Diagnosis: Other abnormalities of gait and mobility (R26.89);Unsteadiness on feet (R26.81);Muscle weakness (generalized) (M62.81);Difficulty in walking, not elsewhere classified (R26.2)     Time: 9747-1855 PT Time Calculation (min) (ACUTE ONLY): 29 min  Charges:  $Gait Training: 8-22 mins $Therapeutic Activity: 8-22 mins                      Moishe Spice, PT, DPT Acute Rehabilitation Services  Pager: [phone] Office: [place] 02/01/2020, 12:25 PM

## 2020-02-01 NOTE — Progress Notes (Addendum)
[facility] Progress Note  3 Days Post-Op  Subjective: Patient able to answer some questions but is very minimally interactive. She does not move extremities at all. She answers no to having abdominal pain.   Objective: Vital signs in last 24 hours: Temp:  [98.2 F (36.8 C)-98.9 F (37.2 C)] 98.5 F (36.9 C) (10/11 0803) Pulse Rate:  [75-106] 106 (10/11 0803) Resp:  [16-18] 18 (10/11 0803) BP: (122-168)/(88-93) 122/91 (10/11 0803) SpO2:  [98 %-100 %] 99 % (10/11 0803) Weight:  [69.1 kg] 69.1 kg (10/11 0445) Last BM Date: 01/27/20  Intake/Output from previous day: 10/10 0701 - 10/11 0700 In: -  Out: 2425 [Urine:2425] Intake/Output this shift: No intake/output data recorded.  PE: General: WD, chronically ill appearing female who is laying in bed in NAD Heart: regular, rate, and rhythm.  Lungs: CTAB, no wheezes, rhonchi, or rales noted.  Respiratory effort nonlabored Abd: soft, NT, ND, +BS Psych: catatonia with minimal verbal interaction    Lab Results:  Recent Labs    01/30/20 0259 02/01/20 0107  WBC 4.9 5.3  HGB 12.0 13.2  HCT 36.3 39.3  PLT 280 302   BMET Recent Labs    01/30/20 0259 02/01/20 0107  NA 137 135  K 3.5 3.5  CL 104 101  CO2 24 25  GLUCOSE 159* 125*  BUN 19 8  CREATININE 0.55 0.61  CALCIUM 8.6* 8.8*   PT/INR No results for input(s): LABPROT, INR in the last 72 hours. CMP     Component Value Date/Time   NA 135 02/01/2020 0107   K 3.5 02/01/2020 0107   CL 101 02/01/2020 0107   CO2 25 02/01/2020 0107   GLUCOSE 125 (H) 02/01/2020 0107   BUN 8 02/01/2020 0107   CREATININE 0.61 02/01/2020 0107   CALCIUM 8.8 (L) 02/01/2020 0107   PROT 7.7 02/01/2020 0107   ALBUMIN 3.1 (L) 02/01/2020 0107   AST 80 (H) 02/01/2020 0107   ALT 123 (H) 02/01/2020 0107   ALKPHOS 73 02/01/2020 0107   BILITOT 0.7 02/01/2020 0107   GFRNONAA >60 02/01/2020 0107   GFRAA >60 01/25/2020 0308   Lipase     Component Value Date/Time   LIPASE 22  01/23/2017 1922       Studies/Results: DG CHEST PORT 1 VIEW  Result Date: 01/31/2020 CLINICAL DATA:  Encounter for feeding tube placement. EXAM: PORTABLE CHEST 1 VIEW COMPARISON:  January 22, 2020 FINDINGS: The heart size and mediastinal contours are within normal limits. Both lungs are clear. Feeding tube is identified with distal tip probably just distal to the GE junction in the proximal stomach. The visualized skeletal structures are unremarkable. IMPRESSION: 1. No active cardiopulmonary disease. 2. Feeding tube is identified with distal tip probably just distal to the GE junction in the proximal stomach. Electronically Signed   By: Sherian Rein M.D.   On: 01/31/2020 12:02   DG Abd Portable 1V  Result Date: 02/01/2020 CLINICAL DATA:  Evaluate feeding tube placement EXAM: PORTABLE ABDOMEN - 1 VIEW COMPARISON:  01/28/2020 FINDINGS: The tip of the feeding tube is well below the level of the GE junction in the expected location of the proximal duodenum/pylorus. IMPRESSION: Feeding tube tip in the proximal duodenum/pylorus. Electronically Signed   By: Signa Kell M.D.   On: 02/01/2020 09:59    Anti-infectives: Anti-infectives (From admission, onward)   Start     Dose/Rate Route Frequency Ordered Stop   01/29/20 0600  ceFAZolin (ANCEF) IVPB 2g/100 mL premix  2 g 200 mL/hr over 30 Minutes Intravenous On call to O.R. 01/28/20 1511 01/30/20 0559   01/28/20 2000  cefTRIAXone (ROCEPHIN) 1 g in sodium chloride 0.9 % 100 mL IVPB        1 g 200 mL/hr over 30 Minutes Intravenous Every 24 hours 01/28/20 1925 02/02/20 1959   01/06/20 0900  cefTRIAXone (ROCEPHIN) 2 g in sodium chloride 0.9 % 100 mL IVPB  Status:  Discontinued        2 g 200 mL/hr over 30 Minutes Intravenous Every 12 hours 01/06/20 0105 01/06/20 2202   01/06/20 0800  vancomycin (VANCOCIN) IVPB 1000 mg/200 mL premix  Status:  Discontinued       "Followed by" Linked Group Details   1,000 mg 200 mL/hr over 60 Minutes  Intravenous Every 12 hours 01/05/20 1904 01/06/20 2202   01/05/20 2000  vancomycin (VANCOREADY) IVPB 1500 mg/300 mL       "Followed by" Linked Group Details   1,500 mg 150 mL/hr over 120 Minutes Intravenous  Once 01/05/20 1904 01/06/20 0127   01/05/20 1830  cefTRIAXone (ROCEPHIN) 2 g in sodium chloride 0.9 % 100 mL IVPB        2 g 200 mL/hr over 30 Minutes Intravenous  Once 01/05/20 1829 01/05/20 2220       Assessment/Plan Hx polysubstance abuse Hepatitis C Acute urinary retention/UTI Yeast vaginitis Mild Malnutrition - Prealbumin 28.2  BMI 26.9  Dysphagia 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 - patient slightly more responsive but has not yet been seen by psych - feeding tube placement is non-emergent, but will follow for planning - would recommend for psych to assess prior to gastrostomy placement - palliative care saw 10/1 - consider having them readdress GOC pending psych eval   FEN:  NPO/Cortrak ID: [email or web address] for UTI DVT:  Lovenox F/U:  TBD  LOS: 26 days    Juliet Rude , [facility] 02/01/2020, 10:34 AM Please see Amion for pager number during day hours 7:00am-4:30pm

## 2020-02-01 NOTE — Progress Notes (Signed)
  Speech Language Pathology Treatment: Dysphagia  [name] MRN: [id number] DOB: 02-Apr-1986 Today's Date: 02/01/2020 Time: 1219-7588 SLP Time Calculation (min) (ACUTE ONLY): 16 min  Assessment / Plan / Recommendation Clinical Impression  Pt was alert and a little interactive with me this morning. She replied to most questions asked (>75%), typically with one- to two-word utterances. She did not follow commands though and was even a little resistant to repositioning. She tried a few sips of water with no overt signs of dysphagia nor aspiration. She even made a choice out of a field of two when offered solids, but took ~2 tsps of jello before she did not want any more. SLP provided Mod cues for pt to open her mouth wider in order to get the boluses off the spoon. Would restart Dys 1 diet and thin liquids, encouraging PO intake when accepting, although suspect that it is still unlikely for her to get enough intake to support herself nutritionally.   HPI HPI: Pt is a 34 year old woman who presented to [facility]04[facility]2020 with suspected methamphetamine use-discharged and brought back the same day with confusion and agitation. Toxicology consistent only with benzodiazepine and THC and not on any stimulants. 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 Per chart, mentation has been fluctuating. PMH includes hepatitis C and polysubstance abuse.      SLP Plan  Continue with current plan of care       Recommendations  Diet recommendations: Thin liquid;Dysphagia 1 (puree) Liquids provided via: Cup;Straw Medication Administration: Via alternative means Supervision: Full supervision/cueing for compensatory strategies;Staff to assist with self feeding Compensations: Minimize environmental distractions;Slow rate;Small sips/bites Postural Changes and/or Swallow Maneuvers: Seated upright 90 degrees                Oral Care Recommendations: Oral care  QID Follow up Recommendations: Skilled Nursing facility SLP Visit Diagnosis: Dysphagia, unspecified (R13.10) Plan: Continue with current plan of care       GO                Mahala Menghini., M.A. CCC-SLP Acute Rehabilitation Services Pager [phone] Office [phone]  02/01/2020, 12:01 PM

## 2020-02-02 LAB — GLUCOSE, CAPILLARY
Glucose-Capillary: 97 mg/dL (ref 70–99)
Glucose-Capillary: 169 mg/dL — ABNORMAL HIGH (ref 70–99)
Glucose-Capillary: 83 mg/dL (ref 70–99)
Glucose-Capillary: 136 mg/dL — ABNORMAL HIGH (ref 70–99)
Glucose-Capillary: 144 mg/dL — ABNORMAL HIGH (ref 70–99)

## 2020-02-02 MED ORDER — ALPRAZOLAM 0.25 MG PO TABS
0.2500 mg | ORAL_TABLET | Freq: Every day | ORAL | Status: DC
  Administered 2020-02-03: 0.25 mg
  Filled 2020-02-02: qty 1

## 2020-02-02 NOTE — Progress Notes (Signed)
Occupational Therapy Treatment [name] MRN: [id number] DOB: 04/19/1986 Today's Date: 02/02/2020    History of present illness 34 year old with past medical history significant for hepatitis C, polysubstance abuse brought to the hospital 9/13 for disorientation which was suspected to be substance abuse.  After she became more lucid, she was discharged, but then police brought her back later in the day and they found her passed out in a parking lot.  Urine drug screen was positive for benzodiazepine and THC. An EEG done on 9/15 showed patient was in status epilepticus.  Patient was a started on Keppra.  Despite these, EEG noted continued seizures requiring additional medications as well.  Finally on 918, seizures broke. Follow-up EEG on 9/20 noted evidence of [name] from the left central temporal region.  Repeat EEG done on 9/22 noted epileptogenic city from the left central temporal region.   OT comments  Patient continues to make no progress towards goals in skilled OT session. Patient's session encompassed attempts to arouse with sound, noxious stimuli, ROM, pain and wet washcloth with no response noted in any attempt. Attempted to reposition pt however pt was flexed and on L side and couldn't adjust safety without additional help. Also deferred sitting patient up at EOB due to no assistance available. Discharge remains appropriate due to inconsistent responsiveness from pt; will continue to follow acutely.   Follow Up Recommendations  SNF    Equipment Recommendations  3 in 1 bedside commode;Wheelchair (measurements OT);Wheelchair cushion (measurements OT);Hospital bed    Recommendations for Other Services      Precautions / Restrictions Precautions Precautions: Fall Precaution Comments: seizure, cortrack, wrist and mitten restraints, NG tube, foley catheter Restrictions Weight Bearing Restrictions: No       Mobility Bed Mobility                General bed mobility comments: unable to move to date  Transfers                      Balance                                           ADL either performed or assessed with clinical judgement   ADL Overall ADL's : Needs assistance/impaired     Grooming: Total assistance                               Functional mobility during ADLs: Total assistance;+2 for physical assistance;+2 for safety/equipment General ADL Comments: pt requires total (A) for all adls.     Vision       Perception     Praxis      Cognition Arousal/Alertness: Lethargic;Suspect due to medications Behavior During Therapy:  (no response noted) Overall Cognitive Status: Impaired/Different from baseline                                 General Comments: Pt responded to no stimuli in session to date(pain, ROM, noxious stimuli, sound) after speaking with RN suspect due to extensive medication for seizures on essentially an empty stomach        Exercises     Shoulder Instructions       General Comments      Pertinent  Vitals/ Pain       Pain Assessment: Faces Faces Pain Scale: No hurt  Home Living                                          Prior Functioning/Environment              Frequency  Min 2X/week        Progress Toward Goals  OT Goals(current goals can now be found in the care plan section)  Progress towards OT goals: Not progressing toward goals - comment (could not arouse)  Acute Rehab OT Goals Patient Stated Goal: unable to state OT Goal Formulation: Patient unable to participate in goal setting Time For Goal Achievement: 02/10/20 Potential to Achieve Goals: Fair  Plan Discharge plan remains appropriate    Co-evaluation                 AM-PAC OT "6 Clicks" Daily Activity     Outcome Measure   Help from another person eating meals?: Total Help from another person taking care of  personal grooming?: Total Help from another person toileting, which includes using toliet, bedpan, or urinal?: Total Help from another person bathing (including washing, rinsing, drying)?: Total Help from another person to put on and taking off regular upper body clothing?: Total Help from another person to put on and taking off regular lower body clothing?: Total 6 Click Score: 6    End of Session    OT Visit Diagnosis: Unsteadiness on feet (R26.81);Cognitive communication deficit (R41.841)   Activity Tolerance Patient limited by lethargy;Other (comment) (unable to arouse)   Patient Left in bed;with call bell/phone within reach;with bed alarm set;with family/visitor present   Nurse Communication Mobility status;Precautions        Time: 2620-3559 OT Time Calculation (min): 10 min  Charges: OT General Charges $OT Visit: 1 Visit OT Treatments $Self Care/Home Management : 8-22 mins  Pollyann Glen E. Glendon Dunwoody, COTA/L Acute Rehabilitation Services [phone]   Cherlyn Cushing 02/02/2020, 12:54 PM

## 2020-02-02 NOTE — Progress Notes (Signed)
Inpatient Rehab Admissions Coordinator:   I stopped by and saw Pt. But was unable to rouse her. I placed calls to patient's 2 aunts listed in her chart and left voicemails regarding potential CIR admits with request for call-back. If family can provide 24/7 support at discharge, she may be a good candidate for CIR. If they are unable to provide support, SNF may be a more appropriate disposition.  Megan Salon, MS, CCC-SLP Rehab Admissions Coordinator  [phone] (celll) [phone] (office)

## 2020-02-02 NOTE — Consult Note (Addendum)
Client appeared to be sleeping, did not awaken.  Prior note below states via Dr Lucianne Muss:  Anita Davis 34 y.o.Marland Kitchenfemale patient has been hospitalization for approximately 24 days.  During hospital stay  During patient hospitalization for approximately 24 days  LP and MRI have been negative for any acute abnormality  Nonconvulsive status epilepticus (resolved)  Encephalopathic continues, EEG initially showed focal seizures, once seizures suppressed/controlled patient showed no neurologic improvement.   She has received IV steroids then IVIG which made no considerable change in mental status except a day or 2 where she was able to speak and follow command, however since 01/27/2020 patient is nonverbal again.  Currently patient being treated for UTI  There is also concern that there is a change in mental status for the worse and unable to determine if it is neurologic or psychiatric.  Therefore, surgery for G-tube placement for 01/29/20 was canceled.    There is no apparent psychiatric history other than polysubstance abuse.  With patient being non-verbal it is difficult to get through psychiatric history.  As stated before current etiology remains unclear although a history of extensive long-term polysubstance abuse (opiates, methadone, and benzothiazines).   As noted by Dr. Joen Laura The clinical picture is not consistent with psychiatric catatonia, nor an opiate/alcohol withdrawal syndrome. Adding sedatives such as scheduled diazepam or Seroquel therefore are unlikely to provide clinical benefit, and cloud the ability to observe her for improvement in mentation.    Multiple medications, including methadone and multiple antiseizure medications along with Xanax confound the ability to be alert and provide an accurate assessment. Seroquel discontinue 01/21/20.  Recommend tapering down Xanax 0.25 mg BID to daily for two days, then discontinue.    Recommend any other medications not  needed to be discontinued to arouse her.  PDMP does not have any prescribed narcotics.  Methadone may been obtained from the streets.  Nanine Means, PMHNP

## 2020-02-02 NOTE — Progress Notes (Signed)
Inpatient Rehab Admissions Coordinator Note:   Per therapy recommendations, pt was screened for CIR candidacy by Arvil Utz, MS CCC-SLP. At this time, Pt. Appears to have functional decline and is a good candidate for CIR. Will place order for rehab consult per protocol.  Please contact me with questions.   Terre Zabriskie, MS, CCC-SLP Rehab Admissions Coordinator  [phone] (celll) [phone] (office)  

## 2020-02-02 NOTE — Progress Notes (Signed)
PROGRESS NOTE    Anita Davis  ZOX:[id number] DOB: [birth year] DOA: 01/04/2020 PCP: [name], PA-C  Brief Narrative: The patient is a 34 year old Caucasian female with past medical history significant for not limited to substance abuse disorder, hepatitis C, history of gestational diabetes as well as other comorbidities who presented with confusion.  She is initially admitted back in September with encephalopathy thought to be from a drug overdose.  LP was done and showed no signs of infection.  MRI done and was relatively unremarkable.  She has remained encephalopathic and she has had multiple EEGs and EEG showed focal seizures.  The seizures were suppressed and she was transferred to comfort continues EEG but after her seizure control she has had for neurological recovery.  Initially autoimmune encephalitis was considered but the patient was started on high-dose steroids with some equivocal improvement.  She then further discontinued after steroids with her stop and IVIG has started.  She continues to be significantly encephalopathic and appears to be catatonic now.  Neurology actively following and will reconsult psychiatric treatment given that this could be a possible psychiatric induced catatonia would likely her encephalopathy is multifactorial.  Hospitalization has been complicated by a urinary tract infection so she has been started on IV Rocephin as well as complicated by acute urinary tension so Foley catheter has been placed.  Yesterday her cortrak was misplaced so tube feedings were stopped and dietitian was consulted.  Today Cortrak team advanced the tube and is currently in the right place now.  Will resume tube feedings.  SLP recommending continuing current plan of care.  Surgery still evaluating and awaiting psych evaluation.  Psych consult placed.  Patient actually answered some questions 10/11/21but then refused to talk to me and likely this is a behavioral disturbance so we will  have psych further evaluate.  Neurology also believes that this is a psych related behavior and they had no other for neuro recommendations at this time.  Psych recommending discontinuing Alprazolam and tapering down and will do so. Patient was reportedly found to have Methadone on her and does not get prescribed this so likely was getting it from the street. Will stop the Methadone as well and once she is more awake have Psych re-evaluate.   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 -The specific etiology remains unclear, but the differential for this appears to be an autoimmune encephalitis, or post status epilepticus encephalopathy.  -TSH normal.  -Neuraxial imaging unremarkable.  -CT C/A/P shows no signs of cancer, paraneoplastic encephalitis seems unlikely.Initially felt the clinical picture is not consistent with a true psychiatric mediated catatonia and is likely multifactorial, nor an opiate/alcohol withdrawal syndromeat this point (see below regarding psychiatry recommendations).  -Completed IVIG10/1. In last few days, has seemed more active, though still not interactive or attentive.  Upon review of neurology notes they have documented that it may take several weeks before full effects/potential improvement from IVIG seen.  Will need reevaluation with neurologist 6 weeks from 01/22/2020 to determine if any significant improvements or whether this will be patient's baseline mentation. -Continue phenobarbital, Vimpat, and Dilantin-Dilantin level 2.9  on 10/6 therefore Dilantin changed from 100 mg TID to 200 mg BID; neuro recommends gradual patient in discontinuation of AEDs after discharge if remains seizure-free -After discharge consider repeat EEG and MRI at follow-up in 6 to 8 weeks after discharge -Evaluated by psychiatry on 9/29 and reconsulted 10/8 -they added low-dose Seroquel 25 mg per tube twice daily in an attempt to improve mentation, aphasia, catatonic state -Continue empiric thiamine and vitamin B6 replacement -Of note patient has had waxing and waning mentation -on the afternoon of 10/5 was very alert, followed simple commands, was mostly oriented, had fluent speech although at times was perseverating and was more alert on 10/11 -10/6 patient noted with sxs concerning for possible seizure activity during OT session but EEG negative for seizure activity -10/7 Neuro dc'd Keppra since can worsen depression symptoms -See Below -10/11 patient Actually responded somewhat and is no longer staring but states that "she does not feel good" and then stopped answering questions when asked and is very withdrawn  Chronic depression in context of ongoing polysubstance abuse prior to admission -The Nurse Practitioner [name] had Long discussion with patient's aunt.  Patient had been struggling with her emotions and with ongoing substance abuse for years.  This had worsened over the past several months before admission.  Patient had recently lost custody of her children because of her substance abuse and in the month prior to admission she began after increasing daily use of methamphetamine. -Current exam more consistent with psychogenic catatonia therefore formal psychiatric consultation in place and pending; psychiatry reconsulted and awaiting for further recommendations and they are recommending stopping the Xanax and the Methadone -Given suspected underlying catatonia with psychiatric etiology neurology recommended resuming patient's  preadmission methadone and adding benzodiazepine but PDMP shows that she does not take Methadone so will stop -She was started Xanax 0.25 mg BID on 01/29/20 and Psych recommended tapering down so will go to 0.25 mg Daily x2 days then stop -Addition to methadone (Not prescribed and likley getting it from the street) patient was using Vistaril any 5 mg 5 times daily but otherwise was not on any psychotropic medications to treat depression -Continue to Monitor carefully and has not made much of an improve -Appreciate Psych Reconsultation   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 -Currently on dysphagia 1 diet but given significant altered mentation, alertness and ability to participate with feeding plans are to transition to PEG tube to ensure adequate access for medications, fluids and diet -IR has reevaluated patient on 10/7 and given significant air in the colon unable to proceed with percutaneous placement of gastrostomy tube therefore general surgery consulted -Surgery was going to take the patient down for a PEG but she had another Non-responsive episode so Surgery was cancelled  -During episode of increased awakeness on 10/5 SLP reevaluated patient but oral intake was inconsistent therefore recommended no change in current diet and to continue primary diet through feeding tube -Nursing stated that patient started to choke on her tube feedings and that the centimeter mark on her core track has been changed.  Tube feedings have been discontinued and a stat x-ray was obtained which showed the "Feeding tube is identified with distal tip probably just distal to the GE junction in the proximal stomach." -Nursing did not feel comfortable and advancing so we will hold tube feedings and have core track team to address in the morning and advance 5 to 10 cm and repeat another KUB after is obtained; the dietitian has advanced the Cortrak  Tube and is currently in the right position determined by x-ray so  we will resume her tube feedings.  SLP recommends continuing current plan of care.  Surgery still holding off on the gastrostomy tube as they want a psychiatric evaluation first and Psych wants her to be off of Sedating medications prior to formal Re-evaluation  Acute Urinary retention E Coli UTI -10/5 bladder scan w/ > 700 cc so I/O cath ordered; Has been getting I and O Caths and continues to Retain and had 574 this AM; Will place Foley  -cont q shift bladder scans -After I/O catheterization on 10/7 nurses concerned over possible abnormal appearing urine therefore urinalysis and culture obtained.  Urinalysis was abnormal so on-call physician overnight begin empiric Rocephin -Urinalysis showed cloudy appearance with amber color urine, trace leukocytes, negative nitrates, present amorphous crystals, many bacteria, 0-5 squamous epithelial cells, 6-10 WBCs and urine culture grew out E. coli 100,000 colony-forming units that is pansensitive -Continue treatment for at least 5 days we will continue IV ceftriaxone  Mild Hypokalemia -Persistent issue but slightly improved -K+ is now 3.5 day the day before  -Continue per tube replacement of potassium if necessary  -Continue to Monitor and Replete as Necessary -Repeat CMP in the AM   Abnormal LFT's -? Related to AEDs -Trending down slowly -Since 10 3 patient's AST is significantly improved and went from 276 is now 80 and ALT went from 341 is now 123 on the last check  -He does have a history of hepatitis C -Continue monitor trend and may obtain a right upper quadrant ultrasound -repeat CMP intermittently  Ileus probably due to constipation -Several BM overnight. Abdomen soft. -Resume tube feeds -Continue bowel regimen (continue Senokot and scheduled MiraLAX)-lactulose added recently and with increased bowel movements- have decreased to once daily at bedtime -Repeat X-ray 10/5 continues to demonstrate significant amount of stool in  colon -Completed magnesium citrate -10/7 Two view abdominal films revealed significant decrease stool burden distal colon but there is persistent diffuse gas filling of the small bowel and colon throughout the abdomen -We will obtain a KUB after Cortrak is advanced in the AM   Nutrition Status/moderate to severe protein calorie malnutrition: Nutrition Problem: Inadequate oral intake Etiology: lethargy/confusion Signs/Symptoms: meal completion < 25% Interventions: Tube feeding, Prostat; tube feedings to be held today given that she choked on them and that her Cortrak Tube needs to be advaced; this has been done and KUB confirms correct placement of the tube so we will resume her tube feedings -Estimated body mass index is 25.35 kg/m as calculated from the following:   Height as of this encounter: 5\' 5"  (1.651 m).   Weight as of this encounter: 69.1 kg.  -10/11 SLP Evaluation she was much improved and placed on a Dysphagia 1 Diet with Thin liquids  Yeast vaginitis -Completed intravaginal Monistat  DVT prophylaxis: Enoxaparin 40 mg sq q24h Code Status: FULL CODE Family Communication: Discussed with Uncle  Disposition Plan: Pending clinical improvement back to baseline and resolution of her encephalopathy; she was able to verbalize to me today but then stopped talking; CIR vs SNF  Status is: Inpatient  Remains inpatient appropriate because:Altered mental status and Inpatient level of care appropriate due to severity of illness   Dispo: The patient is from: Home              Anticipated d/c is to: TBD; CIR vs. SNF              Anticipated d/c  date is: 3 days              Patient currently is not medically stable to d/c.  Consultants:   Neurology  Psychiatry  Interventional Radiology  Surgery   Procedures:   9/14 lumbar puncture  9/15 EEG  9/16 EEG  9/17 EEG  9/19 overnight EEG with video  9/22 overnight EEG with video with discontinuation of long-term EEG  monitoring on 9/25  9/24 core track placement  10/6 EEG  Antimicrobials:  Anti-infectives (From admission, onward)   Start     Dose/Rate Route Frequency Ordered Stop   01/29/20 0600  ceFAZolin (ANCEF) IVPB 2g/100 mL premix        2 g 200 mL/hr over 30 Minutes Intravenous On call to O.R. 01/28/20 1511 01/30/20 0559   01/28/20 2000  cefTRIAXone (ROCEPHIN) 1 g in sodium chloride 0.9 % 100 mL IVPB        1 g 200 mL/hr over 30 Minutes Intravenous Every 24 hours 01/28/20 1925 02/02/20 0724   01/06/20 0900  cefTRIAXone (ROCEPHIN) 2 g in sodium chloride 0.9 % 100 mL IVPB  Status:  Discontinued        2 g 200 mL/hr over 30 Minutes Intravenous Every 12 hours 01/06/20 0105 01/06/20 2202   01/06/20 0800  vancomycin (VANCOCIN) IVPB 1000 mg/200 mL premix  Status:  Discontinued       "Followed by" Linked Group Details   1,000 mg 200 mL/hr over 60 Minutes Intravenous Every 12 hours 01/05/20 1904 01/06/20 2202   01/05/20 2000  vancomycin (VANCOREADY) IVPB 1500 mg/300 mL       "Followed by" Linked Group Details   1,500 mg 150 mL/hr over 120 Minutes Intravenous  Once 01/05/20 1904 01/06/20 0127   01/05/20 1830  cefTRIAXone (ROCEPHIN) 2 g in sodium chloride 0.9 % 100 mL IVPB        2 g 200 mL/hr over 30 Minutes Intravenous  Once 01/05/20 1829 01/05/20 2220      Subjective: Seen and examined and is much more awake yesterday than she was today and today she was somnolent and did not arouse.  Unable to provide a subjective history given her current condition and she was not awake.  We will hold her sedating medications per psych recommendations and titrate off her alprazolam and stop the methadone.  Will need to continue monitor her carefully as she remains encephalopathic and today she is very drowsy and somnolent and difficult to arouse.  Objective: Vitals:   02/02/20 0743 02/02/20 0800 02/02/20 1145 02/02/20 1619  BP: (!) 140/98  (!) 134/102 (!) 143/102  Pulse: 91  92 84  Resp: 18 18 18 18    Temp: 98.7 F (37.1 C)  98.8 F (37.1 C)   TempSrc: Oral  Oral   SpO2: 98%  97% 98%  Weight:      Height:        Intake/Output Summary (Last 24 hours) at 02/02/2020 1626 Last data filed at 02/02/2020 0800 Gross per 24 hour  Intake 200 ml  Output --  Net 200 ml   Filed Weights   01/30/20 0500 01/31/20 0417 02/01/20 0445  Weight: 76.4 kg 71.4 kg 69.1 kg   Examination: Physical Exam:  Constitutional: WN/WD overweight Caucasian female currently in no acute distress and unable to be aroused today and she is sound asleep  Eyes: Lids and conjunctivae normal, sclerae anicteric  ENMT: External Ears, Nose appear normal. Grossly normal hearing.  Neck: Appears normal, supple, no cervical  masses, normal ROM, no appreciable thyromegaly; no JVD Respiratory: Diminished to auscultation bilaterally, no wheezing, rales, rhonchi or crackles. Normal respiratory effort and patient is not tachypenic. No accessory muscle use.  Unlabored breathing Cardiovascular: RRR, no murmurs / rubs / gallops. S1 and S2 auscultated. No extremity edema. Abdomen: Soft, non-tender, distended secondary to body habitus. Bowel sounds positive.  GU: Deferred. Musculoskeletal: No clubbing / cyanosis of digits/nails. No joint deformity upper and lower extremities. Skin: No rashes, lesions, ulcers on limited skin evaluation. No induration; Warm and dry.  Neurologic: She is awake and not alert and somnolent and difficult to arouse.  Does not follow commands or wake up to physical or verbal stimuli. Psychiatric: Impaired judgment and insight.  She is very somnolent and drowsy and unable to be aroused and she is not alert and oriented x 3. Normal mood and appropriate affect.   Data Reviewed: I have personally reviewed following labs and imaging studies  CBC: Recent Labs  Lab 01/28/20 0818 01/30/20 0259 02/01/20 0107  WBC 4.9 4.9 5.3  NEUTROABS 1.9 1.9 2.8  HGB 13.4 12.0 13.2  HCT 40.3 36.3 39.3  MCV 92.6 93.3 92.3   PLT 336 280 302   Basic Metabolic Panel: Recent Labs  Lab 01/28/20 0818 01/30/20 0259 02/01/20 0107  NA 138 137 135  K 4.1 3.5 3.5  CL 102 104 101  CO2 GLUCOSE 131* 159* 125*  BUN CREATININE 0.57 0.55 0.61  CALCIUM 9.2 8.6* 8.8*  MG 2.4 2.1 2.0  PHOS 4.2 3.3 3.4   GFR: Estimated Creatinine Clearance: 94.8 mL/min (by C-G formula based on SCr of 0.61 mg/dL). Liver Function Tests: Recent Labs  Lab 01/27/20 0400 01/28/20 0818 01/30/20 0259 02/01/20 0107  AST  --  103* 71* 80*  ALT  --  213* 141* 123*  ALKPHOS  --  78 68 73  BILITOT  --  0.5 0.2* 0.7  PROT  --  8.9* 7.8 7.7  ALBUMIN 3.1* 3.3* 2.9* 3.1*   No results for input(s): LIPASE, AMYLASE in the last 168 hours. No results for input(s): AMMONIA in the last 168 hours. Coagulation Profile: Recent Labs  Lab 01/28/20 0516  INR 1.0   Cardiac Enzymes: No results for input(s): CKTOTAL, CKMB, CKMBINDEX, TROPONINI in the last 168 hours. BNP (last 3 results) No results for input(s): PROBNP in the last 8760 hours. HbA1C: No results for input(s): HGBA1C in the last 72 hours. CBG: Recent Labs  Lab 02/01/20 2334 02/02/20 0404 02/02/20 0743 02/02/20 1144 02/02/20 1619  GLUCAP 153* 97 169* 83 136*   Lipid Profile: No results for input(s): CHOL, HDL, LDLCALC, TRIG, CHOLHDL, LDLDIRECT in the last 72 hours. Thyroid Function Tests: No results for input(s): TSH, T4TOTAL, FREET4, T3FREE, THYROIDAB in the last 72 hours. Anemia Panel: No results for input(s): VITAMINB12, FOLATE, FERRITIN, TIBC, IRON, RETICCTPCT in the last 72 hours. Sepsis Labs: No results for input(s): PROCALCITON, LATICACIDVEN in the last 168 hours.  Recent Results (from the past 240 hour(s))  Culture, Urine     Status: Abnormal   Collection Time: 01/28/20  4:22 PM   Specimen: Urine, Random  Result Value Ref Range Status   Specimen Description URINE, RANDOM  Final   Special Requests   Final    NONE Performed at [facility], [address] [place], [place] [address]    Culture >=100,000 COLONIES/mL ESCHERICHIA COLI (A)  Final   Report Status 01/30/2020 FINAL  Final  Organism ID, Bacteria ESCHERICHIA COLI (A)  Final      Susceptibility   Escherichia coli - MIC*    AMPICILLIN <=2 SENSITIVE Sensitive     CEFAZOLIN <=4 SENSITIVE Sensitive     CEFTRIAXONE <=0.25 SENSITIVE Sensitive     CIPROFLOXACIN <=0.25 SENSITIVE Sensitive     GENTAMICIN <=1 SENSITIVE Sensitive     IMIPENEM <=0.25 SENSITIVE Sensitive     NITROFURANTOIN <=16 SENSITIVE Sensitive     TRIMETH/SULFA <=20 SENSITIVE Sensitive     AMPICILLIN/SULBACTAM <=2 SENSITIVE Sensitive     PIP/TAZO <=4 SENSITIVE Sensitive     * >=100,000 COLONIES/mL ESCHERICHIA COLI  Culture, Urine     Status: None   Collection Time: 01/29/20  5:08 PM   Specimen: Urine, Catheterized  Result Value Ref Range Status   Specimen Description URINE, CATHETERIZED  Final   Special Requests Normal  Final   Culture   Final    NO GROWTH Performed at [facility], [address] [place], [place] [address]    Report Status 01/30/2020 FINAL  Final     RN Pressure Injury Documentation:     Estimated body mass index is 25.35 kg/m as calculated from the following:   Height as of this encounter: 5\' 5"  (1.651 m).   Weight as of this encounter: 69.1 kg.  Malnutrition Type:  Nutrition Problem: Inadequate oral intake Etiology: lethargy/confusion   Malnutrition Characteristics:  Signs/Symptoms: meal completion < 25%   Nutrition Interventions:  Interventions: Tube feeding, Prostat     Radiology Studies: DG Abd Portable 1V  Result Date: 02/01/2020 CLINICAL DATA:  Evaluate feeding tube placement EXAM: PORTABLE ABDOMEN - 1 VIEW COMPARISON:  01/28/2020 FINDINGS: The tip of the feeding tube is well below the level of the GE junction in the expected location of the proximal duodenum/pylorus. IMPRESSION: Feeding tube tip in the proximal duodenum/pylorus.  Electronically Signed   By: Signa Kellaylor  Stroud M.D.   On: 02/01/2020 09:59    Scheduled Meds: . [START ON 02/03/2020] ALPRAZolam  0.25 mg Per Tube Daily  . amantadine  100 mg Per Tube BID  . Chlorhexidine Gluconate Cloth  6 each Topical Daily  . enoxaparin (LOVENOX) injection  40 mg Subcutaneous Q24H  . famotidine  20 mg Per Tube Daily  . feeding supplement (PROSource TF)  45 mL Per Tube Daily  . free water  200 mL Per Tube Q6H  . influenza vac split quadrivalent PF  0.5 mL Intramuscular Tomorrow-1000  . insulin aspart  0-9 Units Subcutaneous TID WC  . lacosamide  200 mg Per Tube BID  . lactulose  20 g Per Tube QHS  . mouth rinse  15 mL Mouth Rinse BID  . methadone  60 mg Per Tube Daily  . PHENObarbital  60 mg Per Tube BID  . phenytoin  200 mg Per Tube BID  . polyethylene glycol  17 g Per Tube Daily  . potassium chloride  40 mEq Per Tube Daily  . vitamin B-6  100 mg Per Tube Daily  . senna  1 tablet Per Tube QHS  . sodium chloride flush  10-40 mL Intracatheter Q12H  . thiamine  100 mg Per Tube Daily   Continuous Infusions: . sodium chloride 50 mL/hr at 02/01/20 1704  . feeding supplement (OSMOLITE 1.2 CAL) 1,000 mL (01/29/20 1718)    LOS: 27 days   Merlene Laughtermair Latif Cheyla Duchemin, DO [facility] PAGER is on AMION  If 7PM-7AM, please contact night-coverage [email or web address]

## 2020-02-02 NOTE — TOC Progression Note (Signed)
Transition of Care [facility]) - Progression Note    Patient Details  Name: Tedra Coppernoll MRN: [id number] Date of Birth: 1986-01-05  Transition of Care [facility]) CM/SW Contact  Baldemar Len[place], [place] Phone Number: 02/02/2020, 4:53 PM  Clinical Narrative:   CSW received email from financial counselor with incompetency letter for MD to sign. MD signed, and CSW sent back to financial counseling for Medicaid and Disability workup. CSW to follow.    Expected Discharge Plan: Skilled Nursing Facility Barriers to Discharge: Continued Medical Work up, Inadequate or no insurance, Active Substance Use - Placement, SNF Pending payor source - LOG, SNF Pending Medicaid, SNF Pending bed offer  Expected Discharge Plan and Services Expected Discharge Plan: Skilled Nursing Facility In-house Referral: Financial Counselor   Post Acute Care Choice: Skilled Nursing Facility Living arrangements for the past 2 months: Mobile Home                                       Social Determinants of Health (SDOH) Interventions    Readmission Risk Interventions No flowsheet data found.

## 2020-02-03 ENCOUNTER — Inpatient Hospital Stay (HOSPITAL_COMMUNITY): Payer: Medicaid Other

## 2020-02-03 LAB — CBC WITH DIFFERENTIAL/PLATELET
Abs Immature Granulocytes: 0.01 10*3/uL (ref 0.00–0.07)
Basophils Absolute: 0 10*3/uL (ref 0.0–0.1)
Basophils Relative: 1 %
Eosinophils Absolute: 0.1 10*3/uL (ref 0.0–0.5)
Eosinophils Relative: 1 %
HCT: 40.2 % (ref 36.0–46.0)
Hemoglobin: 13.9 g/dL (ref 12.0–15.0)
Immature Granulocytes: 0 %
Lymphocytes Relative: 48 %
Lymphs Abs: 2.3 10*3/uL (ref 0.7–4.0)
MCH: 31.7 pg (ref 26.0–34.0)
MCHC: 34.6 g/dL (ref 30.0–36.0)
MCV: 91.6 fL (ref 80.0–100.0)
Monocytes Absolute: 0.4 10*3/uL (ref 0.1–1.0)
Monocytes Relative: 8 %
Neutro Abs: 2 10*3/uL (ref 1.7–7.7)
Neutrophils Relative %: 42 %
Platelets: 278 10*3/uL (ref 150–400)
RBC: 4.39 MIL/uL (ref 3.87–5.11)
RDW: 13.3 % (ref 11.5–15.5)
WBC: 4.8 10*3/uL (ref 4.0–10.5)
nRBC: 0 % (ref 0.0–0.2)

## 2020-02-03 LAB — GLUCOSE, CAPILLARY
Glucose-Capillary: 134 mg/dL — ABNORMAL HIGH (ref 70–99)
Glucose-Capillary: 140 mg/dL — ABNORMAL HIGH (ref 70–99)
Glucose-Capillary: 142 mg/dL — ABNORMAL HIGH (ref 70–99)
Glucose-Capillary: 152 mg/dL — ABNORMAL HIGH (ref 70–99)
Glucose-Capillary: 157 mg/dL — ABNORMAL HIGH (ref 70–99)
Glucose-Capillary: 165 mg/dL — ABNORMAL HIGH (ref 70–99)
Glucose-Capillary: 175 mg/dL — ABNORMAL HIGH (ref 70–99)

## 2020-02-03 LAB — COMPREHENSIVE METABOLIC PANEL
ALT: 121 U/L — ABNORMAL HIGH (ref 0–44)
AST: 80 U/L — ABNORMAL HIGH (ref 15–41)
Albumin: 3.1 g/dL — ABNORMAL LOW (ref 3.5–5.0)
Alkaline Phosphatase: 86 U/L (ref 38–126)
Anion gap: 6 (ref 5–15)
BUN: 12 mg/dL (ref 6–20)
CO2: 28 mmol/L (ref 22–32)
Calcium: 9.2 mg/dL (ref 8.9–10.3)
Chloride: 102 mmol/L (ref 98–111)
Creatinine, Ser: 0.53 mg/dL (ref 0.44–1.00)
GFR, Estimated: >60 mL/min (ref >60)
Glucose, Bld: 148 mg/dL — ABNORMAL HIGH (ref 70–99)
Potassium: 3.9 mmol/L (ref 3.5–5.1)
Sodium: 136 mmol/L (ref 135–145)
Total Bilirubin: 0.6 mg/dL (ref 0.3–1.2)
Total Protein: 7.6 g/dL (ref 6.5–8.1)

## 2020-02-03 LAB — PHENYTOIN LEVEL, TOTAL: Phenytoin Lvl: 2.5 ug/mL — ABNORMAL LOW (ref 10.0–20.0)

## 2020-02-03 LAB — PHOSPHORUS: Phosphorus: 3.6 mg/dL (ref 2.5–4.6)

## 2020-02-03 LAB — MAGNESIUM: Magnesium: 2.2 mg/dL (ref 1.7–2.4)

## 2020-02-03 MED ORDER — OSMOLITE 1.2 CAL PO LIQD
1000.0000 mL | ORAL | Status: DC
  Administered 2020-02-03 – 2020-02-04 (×2): 1000 mL

## 2020-02-03 MED ORDER — OSMOLITE 1.2 CAL PO LIQD
1000.0000 mL | ORAL | Status: DC
  Administered 2020-02-03: 1000 mL
  Filled 2020-02-03: qty 1000

## 2020-02-03 NOTE — Progress Notes (Signed)
  Speech Language Pathology Treatment: Dysphagia  [name] MRN: [id number] DOB: 1985/06/08 Today's Date: 02/03/2020 Time: 8938-1017 SLP Time Calculation (min) (ACUTE ONLY): 10 min  Assessment / Plan / Recommendation Clinical Impression  Pt noted to chronically allow frothy secretions to spill down her chin throughout session.  SlP left towel on pt's chest to gather secretions.  She did not answer questions that this SLP asked including if she was having reflux with her tube feeding nor did she follow directions to allow SLP to assess posterior oral cavity. She did allow SLP to orally suction her and she was able to orally suction herself without assist.    Pt did accept intake of a single sip of orange juice - minimally delayed swallow noted but no indication of oropharyngeal dysphagia.  Pt then refused all other intake offered including water, orange juice, pureed berries and pureed pineapple, sealing her lips tightly.  Note plan is to pursue PEG tube which is advised.   Recommend to continue to encourage po intake as pt will accept.  Posted swallow precaution sign in room including recommendation for HOB to stay above 30* elevation due to tube feeding.  Educated pt to recommendations but she did not respond.  Would advise to monitor pt closely to assure she is not having reflux with aspiration and recommend strict oral care due to to pt frequently declining intake. 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 Toxicology consistent only with benzodiazepine and THC and not on any stimulants. 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 Per chart, mentation has been fluctuating. PMH includes hepatitis C and polysubstance abuse.      SLP Plan  Continue with current plan of care       Recommendations  Diet  recommendations: Dysphagia 1 (puree);Thin liquid Liquids provided via: Cup;Straw Medication Administration: Via alternative means Supervision: Full supervision/cueing for compensatory strategies;Staff to assist with self feeding Compensations: Minimize environmental distractions;Slow rate;Small sips/bites Postural Changes and/or Swallow Maneuvers: Seated upright 90 degrees (HOB above 30* at all times)                Oral Care Recommendations: Oral care QID Follow up Recommendations: Skilled Nursing facility SLP Visit Diagnosis: Dysphagia, unspecified (R13.10) Plan: Continue with current plan of care       GO                Chales Abrahams 02/03/2020, 12:59 PM Rolena Infante, MS [facility] SLP Acute Rehab Services Office [phone] Pager [phone]

## 2020-02-03 NOTE — Progress Notes (Signed)
Inpatient Rehab Admissions Coordinator:   Per recent therapy note, PT is now recommending SNF for patient. Pt. Is currently requiring total assist and is minimally participative in therapy sessions. I do not anticipate that she would be able to tolerate CIR level therapies.  CIR will sign off at this time.   Megan Salon, MS, CCC-SLP Rehab Admissions Coordinator  [phone] (celll) [phone] (office)

## 2020-02-03 NOTE — Progress Notes (Signed)
Physical Therapy Treatment [name] MRN: [id number] DOB: Jun 13, 1985 Today's Date: 02/03/2020    History of Present Illness 34 year old with past medical history significant for hepatitis C, polysubstance abuse brought to the hospital 9/13 for disorientation which was suspected to be substance abuse.  After she became more lucid, she was discharged, but then police brought her back later in the day and they found her passed out in a parking lot.  Urine drug screen was positive for benzodiazepine and THC. An EEG done on 9/15 showed patient was in status epilepticus.  Patient was a started on Keppra.  Despite these, EEG noted continued seizures requiring additional medications as well.  Finally on 918, seizures broke. Follow-up EEG on 9/20 noted evidence of epileptic [name] from the left central temporal region.  Repeat EEG done on 9/22 noted epileptogenic city from the left central temporal region.    PT Comments    Pt has returned to not participating or responding to any stimuli, despite multiple attempts of alternate communication or sitting pt up in bed. Thus, focused session on maintaining ROM in her LEs and UEs and attempting to have pt perform AAROM, with minimal occasional activation noted in the L UE with shoulder flexion. Due to her decline in function again her recs have been updated to SNF. Will continue to follow acutely.   Follow Up Recommendations  SNF;Supervision/Assistance - 24 hour     Equipment Recommendations  Wheelchair (measurements PT);Wheelchair cushion (measurements PT);Hospital bed;Other (comment) (mechanical lift)    Recommendations for Other Services       Precautions / Restrictions Precautions Precautions: Fall Precaution Comments: seizure, cortrack, NG tube, foley catheter Restrictions Weight Bearing Restrictions: No    Mobility  Bed Mobility Overal bed mobility: Needs Assistance Bed Mobility: Supine to Sit     Supine to sit:  Total assist;HOB elevated     General bed mobility comments: Supine > sit in long-sitting position in bed from elevated HOB with total assist in an attempt to get pt's attention and a response or participation, without success.  Transfers                    Ambulation/Gait                 Stairs             Wheelchair Mobility    Modified Rankin (Stroke Patients Only)       Balance Overall balance assessment: Needs assistance Sitting-balance support: Bilateral upper extremity supported (legs supported, long sitting in bed) Sitting balance-Leahy Scale: Zero Sitting balance - Comments: Unable to sit long-sitting in bed without total assist.                                    Cognition Arousal/Alertness: Awake/alert Behavior During Therapy: Flat affect Overall Cognitive Status: Impaired/Different from baseline Area of Impairment: Following commands;Orientation;Attention                 Orientation Level: Disoriented to;Place;Time;Situation;Person (not responding to name or anything) Current Attention Level: Divided (unable to get pt to focus on therapist)   Following Commands:  (does not follow any commands)       General Comments: Pt not responding to any stimuli or actively participating this date, except occasional muscle activation with AAROM of L arm.      Exercises General Exercises - Upper Extremity Shoulder Flexion:  AAROM;PROM;Both;10 reps (minimal activation noted in L with movement) Elbow Flexion: PROM;Both;10 reps Elbow Extension: PROM;Both;10 reps Wrist Flexion: PROM;Both;10 reps Wrist Extension: PROM;Both;10 reps General Exercises - Lower Extremity Hip Flexion/Marching: PROM;10 reps;Both (with B knee flexion PROM 10x ) Toe Raises: PROM;10 reps;Both Heel Raises: PROM;Both;10 reps    General Comments        Pertinent Vitals/Pain Pain Assessment: Faces Faces Pain Scale: No hurt Pain Intervention(s):  Monitored during session    Home Living     Available Help at Discharge: Family                Prior Function            PT Goals (current goals can now be found in the care plan section) Acute Rehab PT Goals Patient Stated Goal: unable to state PT Goal Formulation: Patient unable to participate in goal setting Potential to Achieve Goals: Fair Progress towards PT goals: Not progressing toward goals - comment (pt not responding during session)    Frequency    Min 1X/week      PT Plan Discharge plan needs to be updated    Co-evaluation              AM-PAC PT "6 Clicks" Mobility   Outcome Measure  Help needed turning from your back to your side while in a flat bed without using bedrails?: Total Help needed moving from lying on your back to sitting on the side of a flat bed without using bedrails?: Total Help needed moving to and from a bed to a chair (including a wheelchair)?: Total Help needed standing up from a chair using your arms (e.g., wheelchair or bedside chair)?: Total Help needed to walk in hospital room?: Total Help needed climbing 3-5 steps with a railing? : Total 6 Click Score: 6    End of Session   Activity Tolerance: Treatment limited secondary to medical complications (Comment) (pt not participating and unable to obtain pt's attention) Patient left: in bed;with call bell/phone within reach;with bed alarm set   PT Visit Diagnosis: Unsteadiness on feet (R26.81);Muscle weakness (generalized) (M62.81);Difficulty in walking, not elsewhere classified (R26.2);Other symptoms and signs involving the nervous system (R29.898)     Time: 3007-6226 PT Time Calculation (min) (ACUTE ONLY): 14 min  Charges:  $Therapeutic Exercise: 8-22 mins                     Raymond Gurney, PT, DPT Acute Rehabilitation Services  Pager: [phone] Office: [phone]    Jewel Baize 02/03/2020, 3:07 PM

## 2020-02-03 NOTE — Progress Notes (Signed)
Inpatient Rehab Admissions Coordinator:   I spoke with Pt.'s aunt, Bartholome Bill regarding potential CIR admit. She stated she would call other family and see if it is possible for family to provide 24/7 support after d/c. If not, SNF is a more appropriate disposition. I have also reached out to PT to see if they think Pt. Will truly be able to participate in CIR level therapies.   Megan Salon, MS, CCC-SLP Rehab Admissions Coordinator  [phone] (celll) [phone] (office)

## 2020-02-03 NOTE — Progress Notes (Addendum)
[facility] PROGRESS NOTE  Anita Davis AVW:[id number] DOB: 01/22/1986 DOA: 01/04/2020 PCP: [name], PA-C  Status: Inpatient-Remains inpatient appropriate because:Altered mental status, Ongoing diagnostic testing needed not appropriate for outpatient work up and Unsafe d/c plan -plan for PEG tube placement on 10/5   Dispo: The patient is from: Home              Anticipated d/c is to: SNF              Anticipated d/c date is: > 3 days              Patient currently is not medically stable to d/c.  Due to UNSAFE DC PLAN-patient needs placement in skilled nursing facility based on altered mentation and inability to manage self-care.  She currently requires 24/7 care due to delay from presumptive diagnosis of autoimmune encephalitis.  She is currently bedbound and awaiting PEG tube placement patient does have a dysphagia 1 diet ordered but given her variable mentation she is inconsistently and unsafely able to manage oral intake.  Over the past 24 hours she has developed improving mentation that is waxing and waning and appears to indicate that patient may be eligible for inpatient rehabilitative services as opposed to SNF placement.  Patient is self-pay financial counseling aware that patient needs work-up for Medicaid and disability.  She may also need to have a family member established as her legal guardian.  10/4 TOC spoke with patient's aunt [name] and updated her on plans to have financial counseling review case and reach out to family regarding application for Medicaid, disability and guardianship is necessary.  [name] says she is currently guardian for another family member with TBI and is uncertain if she could manage to guardianship but will discuss with other members of the family.   Code Status: Full Family Communication: Updated [name] "[address] [name] 10/8 DVT prophylaxis: Lovenox Vaccination status: Has not received Covid vaccine.  HPI: 34 y.o. F with  hx substance use disorder, active, hepatitis C, and gestational diabetes who presented with confusion.  Patient initially admitted in September with encephalopathy, thought to be from drug overdose.  LP showed no signs of infection. MRI brain unremarkable. She remained encephalopathic, and EEG showed focal seizures.  The seizures were suppressed, and she was transferred to [facility] for continuous EEG, but after seizure control, she had poor neurologic recovery.  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  She deteriorated after steroids were stopped, so IVIG was started.  Subjective: Eyes open but does not respond verbally to questions asked or to tactile stimulation.  Does not track this Clinical research associate around the room.  Objective: Vitals:   02/03/20 0351 02/03/20 0720  BP: 135/88 131/90  Pulse: 85 82  Resp: 16 16  Temp: 97.7 F (36.5 C) 99 F (37.2 C)  SpO2: 98% 98%    Intake/Output Summary (Last 24 hours) at 02/03/2020 1102 Last data filed at 02/03/2020 1012 Gross per 24 hour  Intake 10 ml  Output 700 ml  Net -690 ml   Filed Weights   01/30/20 0500 01/31/20 0417 02/01/20 0445  Weight: 76.4 kg 71.4 kg 69.1 kg    Exam:  Constitutional: NAD, calm, appears to be comfortable Respiratory: clear to auscultation bilaterally, Normal respiratory effort. No accessory muscle use.  Room air Cardiovascular: Regular rate and rhythm, no murmurs / rubs / gallops. No extremity edema. 2+ pedal pulses.   Abdomen: no tenderness  patient, no masses palpated.  Does appear to be slightly distended or bloated.  Bowel sounds positive.  Small bore feeding tube per right nare with tube feeding infusing Musculoskeletal: no clubbing / cyanosis. No joint deformity upper and lower extremities.no contractures.  No tremors or spasticity noted with passive range of motion Skin: no rashes, lesions, ulcers. No induration Neurologic: CN 2-12  grossly intact on visual inspection; see above regarding rapid response. PERRL dilated but brisk response otherwise.  Eyes midline with downward focus, positive blink reflex detected.  Not flaccid on exam documented above. Psychiatric: Able to be awakened with noxious stimuli.  Affect consistent with catatonia.   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   -The specific etiology remains unclear, but the differential for this appears to be an autoimmune encephalitis, or post status epilepticus encephalopathy.  TSH normal.  Neuraxial imaging unremarkable.  CT C/A/P shows no signs of cancer, paraneoplastic encephalitis seems unlikely.  The clinical picture is not consistent with a true psychiatric mediated catatonia, nor an opiate/alcohol withdrawal syndrome at this point (see below regarding psychiatry recommendations).    -Completed IVIG 10/1. Upon review of neurology notes they have documented that it may take several weeks before full effects/potential improvement from IVIG seen.  Will need reevaluation with neurologist 6 weeks from 01/22/2020 to determine if any significant improvements or whether this will be patient's baseline mentation. -Continue phenobarbital, Vimpat, and Dilantin-Dilantin level 2.9 on 10/6 therefore Dilantin changed from 100 mg TID to 200 mg BID; neuro recommends gradual down titration and subsequent discontinuation of AEDs after discharge if remains seizure-free -After discharge consider repeat EEG and MRI at follow-up in 6 to 8 weeks after discharge -Evaluated by psychiatry on 9/29-they added low-dose Seroquel but no improvement in symptoms therefore this was discontinued. -Continue empiric thiamine and vitamin B6 replacement -During the entire hospitalization she has had waxing and waning mentation episodes of frank  catatonia and unexplained episodes of alertness and nearly appropriate or awake and severely agitated followed by long periods of lethargy and catatonia-10/5 she was very alert, followed simple commands, was mostly oriented, had fluent speech although at times was perseverating.  But since that time has essentially remained unresponsive with limited engagement with staff and limited verbal responses. -10/6  EEG negative for seizure activity -10/7 Neuro dc'd Keppra since can worsen depression symptoms -Neurology documented on 10/11 that patient has a combination of neurological and psychiatric issues contributing to her nonverbal state and therefore no acute inpatient management from a neurology standpoint indicated prompting neurology to sign off.  Evaluated by psychiatry on 10/12 and recommended discontinuing previously ordered methadone and tapering and discontinuing Xanax.  Documented that sedatives such as scheduled diazepam or Seroquel unlikely to provide clinical benefit and would cloud the ability to observe for improvement in mentation.  No indication that undergoing anesthesia for surgical procedure would be contraindicated although anticipate would temporarily cloud patient's mentation until these medications had worn off.  Suspected chronic depression in context of ongoing polysubstance abuse prior to admission -Long discussion with patient's aunt.  Patient had been struggling with her emotions and with ongoing substance abuse for years.  This had worsened over the past several months before admission.  Patient had recently lost custody of her children because of her substance abuse and in the month prior to admission she began after increasing daily use of methamphetamine. -Current exam more consistent with  psychogenic catatonia therefore formal psychiatric consultation in place -Given suspected underlying catatonia with psychiatric etiology neurology recommends resuming patient's preadmission  methadone and adding benzodiazepine it was later determined that patient had not been prescribed this methadone and psychiatry subsequently discontinued this medication and recommended tapering and discontinuing Xanax that had been added as well. -Patient doubt formal psychiatric diagnosis or diagnosis of depression prior to admission and was not on pharmacotherapy for depression prior to admission.  Based on above history obtained from patient's aunt it appears patient was suffering from acute reactive depression secondary to the removal of her children from her custody.  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  Surgery planned for 10/8 canceled due to abrupt change in mentation and has not yet been rescheduled. -During episode of increased awakeness on 10/5 SLP reevaluated patient but oral intake was inconsistent therefore recommended no change in current diet and to continue primary diet through feeding tube -10/13 we will obtain follow-up imaging to determine if small bowel and colonic gas has decompressed to the point that IR can once again pursue PEG placement -Several days ago issues w/ reflux of TF w/ tip of Cortrak in distal esophagus-tube advanced per RD w/ FU XR demonstrating positioned in the proximal duodenum  Acute Urinary retention/E. coli UTI -10/5 bladder scan w/ > 700 cc so I/O cath ordered -cont q shift bladder scans -Urine culture positive for pansensitive E. Coli -Has completed 3 days of IV Rocephin  Mild hypokalemia -Potassium 3.9 on 10/13 -Continue per tube replacement of  potassium -Follow labs PRN  Ileus probably due to constipation -Has had successful evacuation of the colon with use of MiraLAX and magnesium citrate -We will obtain plain films as above to determine if residual small bowel or colonic gaseous distention has resolved to the point that it would be safe to pursue a PEG tube  Nonobstructive transaminitis/hepatitis C -Elevated HCV antibody with markedly elevated HCV RNA quantitative level  consistent with chronic hepatitis C   Nutrition Status/moderate to severe protein calorie malnutrition: Nutrition Problem: Inadequate oral intake Etiology: lethargy/confusion Signs/Symptoms: meal completion < 25% Interventions: Tube feeding, Prostat Estimated body mass index is 25.35 kg/m as calculated from the following:   Height as of this encounter: 5\' 5"  (1.651 m).   Weight as of this encounter: 69.1 kg.  Yeast vaginitis -Completed intravaginal Monistat    Data Reviewed: Basic Metabolic Panel: Recent Labs  Lab 01/28/20 0818 01/30/20 0259 02/01/20 0107 02/03/20 0131  NA 138 137 135 136  K 4.1 3.5 3.5 3.9  CL 102 104 101 102  CO2 24 24 25 28   GLUCOSE 131* 159* 125* 148*  BUN 17 19 8 12   CREATININE 0.57 0.55 0.61 0.53  CALCIUM 9.2 8.6* 8.8* 9.2  MG 2.4 2.1 2.0 2.2  PHOS 4.2 3.3 3.4 3.6   Liver Function Tests: Recent Labs  Lab 01/28/20 0818 01/30/20 0259 02/01/20 0107 02/03/20 0131  AST 103* 71* 80* 80*  ALT 213* 141* 123* 121*  ALKPHOS 78 68 73 86  BILITOT 0.5 0.2* 0.7 0.6  PROT 8.9* 7.8 7.7 7.6  ALBUMIN 3.3* 2.9* 3.1* 3.1*   No results for input(s): LIPASE,  AMYLASE in the last 168 hours. No results for input(s): AMMONIA in the last 168 hours. CBC: Recent Labs  Lab 01/28/20 0818 01/30/20 0259 02/01/20 0107 02/03/20 0131  WBC 4.9 4.9 5.3 4.8  NEUTROABS 1.9 1.9 2.8 2.0  HGB 13.4 12.0 13.2 13.9  HCT 40.3 36.3 39.3 40.2  MCV 92.6 93.3 92.3 91.6  PLT 336 280 302 278   Cardiac Enzymes: No results for input(s):  CKTOTAL, CKMB, CKMBINDEX, TROPONINI in the last 168 hours. BNP (last 3 results) No results for input(s): BNP in the last 8760 hours.  ProBNP (last 3 results) No results for input(s): PROBNP in the last 8760 hours.  CBG: Recent Labs  Lab 02/02/20 1619 02/02/20 2004 02/03/20 0006 02/03/20 0329 02/03/20 0718  GLUCAP 136* 144* 152* 157* 142*    Recent Results (from the past 240 hour(s))  Culture, Urine     Status: Abnormal   Collection Time: 01/28/20  4:22 PM   Specimen: Urine, Random  Result Value Ref Range Status   Specimen Description URINE, RANDOM  Final   Special Requests   Final    NONE Performed at [facility], [address], [place], [place] [address]    Culture >=100,000 COLONIES/mL ESCHERICHIA COLI (A)  Final   Report Status 01/30/2020 FINAL  Final   Organism ID, Bacteria ESCHERICHIA COLI (A)  Final      Susceptibility   Escherichia coli - MIC*    AMPICILLIN <=2 SENSITIVE Sensitive     CEFAZOLIN <=4 SENSITIVE Sensitive     CEFTRIAXONE <=0.25 SENSITIVE Sensitive     CIPROFLOXACIN <=0.25 SENSITIVE Sensitive     GENTAMICIN <=1 SENSITIVE Sensitive     IMIPENEM <=0.25 SENSITIVE Sensitive     NITROFURANTOIN <=16 SENSITIVE Sensitive     TRIMETH/SULFA <=20 SENSITIVE Sensitive     AMPICILLIN/SULBACTAM <=2 SENSITIVE Sensitive     PIP/TAZO <=4 SENSITIVE Sensitive     * >=100,000 COLONIES/mL ESCHERICHIA COLI  Culture, Urine     Status: None   Collection Time: 01/29/20  5:08 PM   Specimen: Urine, Catheterized  Result Value Ref Range Status   Specimen Description URINE, CATHETERIZED  Final   Special Requests Normal  Final   Culture   Final    NO GROWTH Performed at [facility], [address], [place], [place] [address]    Report Status 01/30/2020 FINAL  Final     Studies: No results found.  Scheduled Meds: . ALPRAZolam  0.25 mg Per Tube Daily  . amantadine  100 mg Per Tube BID  . Chlorhexidine Gluconate Cloth  6 each Topical Daily  . enoxaparin  (LOVENOX) injection  40 mg Subcutaneous Q24H  . famotidine  20 mg Per Tube Daily  . feeding supplement (PROSource TF)  45 mL Per Tube Daily  . free water  200 mL Per Tube Q6H  . influenza vac split quadrivalent PF  0.5 mL Intramuscular Tomorrow-1000  . insulin aspart  0-9 Units Subcutaneous TID WC  . lacosamide  200 mg Per Tube BID  . lactulose  20 g Per Tube QHS  . mouth rinse  15 mL Mouth Rinse BID  . PHENObarbital  60 mg Per Tube BID  . phenytoin  200 mg Per Tube BID  . polyethylene glycol  17 g Per Tube Daily  . potassium chloride  40 mEq Per Tube Daily  . vitamin B-6  100 mg Per Tube Daily  . senna  1 tablet Per Tube QHS  . sodium  chloride flush  10-40 mL Intracatheter Q12H  . thiamine  100 mg Per Tube Daily   Continuous Infusions: . sodium chloride 50 mL/hr at 02/02/20 1631  . feeding supplement (OSMOLITE 1.2 CAL) 1,000 mL (02/03/20 0741)    Principal Problem:   Refractory seizure (HCC) Active Problems:   Acute metabolic encephalopathy   Hypokalemia   Polysubstance abuse (HCC)   Elevated CK   Transaminitis   Chronic hepatitis C without hepatic coma [facility])   Distended abdomen   Palliative care encounter   Consultants:  Neurology  Psychiatry  Interventional radiology  Surgery  Procedures:  9/14 lumbar puncture  9/15 EEG  9/16 EEG  9/17 EEG  9/19 overnight EEG with video  9/22 overnight EEG with video with discontinuation of long-term EEG monitoring on 9/25  9/24 core track placement  10/6 EEG  Antibiotics: Anti-infectives (From admission, onward)   Start     Dose/Rate Route Frequency Ordered Stop   01/29/20 0600  ceFAZolin (ANCEF) IVPB 2g/100 mL premix        2 g 200 mL/hr over 30 Minutes Intravenous On call to O.R. 01/28/20 1511 01/30/20 0559   01/28/20 2000  cefTRIAXone (ROCEPHIN) 1 g in sodium chloride 0.9 % 100 mL IVPB        1 g 200 mL/hr over 30 Minutes Intravenous Every 24 hours 01/28/20 1925 02/02/20 0724   01/06/20 0900  cefTRIAXone  (ROCEPHIN) 2 g in sodium chloride 0.9 % 100 mL IVPB  Status:  Discontinued        2 g 200 mL/hr over 30 Minutes Intravenous Every 12 hours 01/06/20 0105 01/06/20 2202   01/06/20 0800  vancomycin (VANCOCIN) IVPB 1000 mg/200 mL premix  Status:  Discontinued       "Followed by" Linked Group Details   1,000 mg 200 mL/hr over 60 Minutes Intravenous Every 12 hours 01/05/20 1904 01/06/20 2202   01/05/20 2000  vancomycin (VANCOREADY) IVPB 1500 mg/300 mL       "Followed by" Linked Group Details   1,500 mg 150 mL/hr over 120 Minutes Intravenous  Once 01/05/20 1904 01/06/20 0127   01/05/20 1830  cefTRIAXone (ROCEPHIN) 2 g in sodium chloride 0.9 % 100 mL IVPB        2 g 200 mL/hr over 30 Minutes Intravenous  Once 01/05/20 1829 01/05/20 2220       Time spent: 20    [name] ANP  [facility] Pager [phone]. If 7PM-7AM, please contact night-coverage at [email or web address] 02/03/2020, 11:02 AM  LOS: 28 days

## 2020-02-03 NOTE — Progress Notes (Signed)
Nutrition Follow-up  DOCUMENTATION CODES:   Not applicable  INTERVENTION:  Continuevia Cortrak: -Osmolite 1.2 cal @ 2ml/hr -46ml Prosource TF daily -free water per MD, currently Q6H  Provides1912 kcals, 97 grams of protein, and free water ( total free water with flushes)  Once PEG is placed, may continue this regimen.    NUTRITION DIAGNOSIS:   Inadequate oral intake related to lethargy/confusion as evidenced by meal completion < 25%.  Ongoing  GOAL:   Patient will meet greater than or equal to 90% of their needs  Progressing  MONITOR:   TF tolerance, Labs, Weight trends, I & O's  REASON FOR ASSESSMENT:   Consult Enteral/tube feeding initiation and management  ASSESSMENT:   Pt with refractory seizures with acute metabolic encephalopathy. PMH includes hepatitis and polysubstance abuse.  9/24 Cortrak placed (gastric) 10/10 Cortrak dislodged, TF held 10/11 Cortrak advanced (gastric), TF restarted  Pt found to have Methadone on her and does not have a prescription for this; this is being stopped. Hopeful mentation will improve.   Pt was to have PEG placed, but this was delayed on multiple occasions. Pt is still receiving TF via Cortrak, though noted the rate was reduced today to Osmolite 1.2 cal @ 34ml/hr with 58ml prosource TF daily and free water Q6H. [id number] kcals, 84 grams of protein, and free water. Note this is insufficient to meet pt's needs and pt has had limited po intake, so will return pt to 36ml/hr to better meet needs. Pt to be re-evaluated for PEG placement.   UOP: x24 hours I/O: -24,307.21ml since admit  Labs: CBGs [id number] Medications: pepcid, novolog, chronulac, miralax, KCl daily, vitamin b6, senokot, thiamine IVF: NS @ 54ml/hr  Diet Order:   Diet Order            DIET - DYS 1 Room service appropriate? Yes with Assist; Fluid consistency: Thin  Diet effective now                  EDUCATION NEEDS:   No education needs have been identified at this time  Skin:  Skin Assessment: Reviewed RN Assessment  Last BM:  10/10 type 6  Height:   Ht Readings from Last 1 Encounters:  01/06/20 5\' 5"  (1.651 m)    Weight:   Wt Readings from Last 1 Encounters:  02/01/20 69.1 kg    BMI:  Body mass index is 25.35 kg/m.  Estimated Nutritional Needs:   Kcal:  1800-2000  Protein:  90-100 grams  Fluid:  >/=1.8L/d    04/02/20, MS, RD, LDN RD pager number and weekend/on-call pager number located in [place].

## 2020-02-04 ENCOUNTER — Encounter (HOSPITAL_COMMUNITY): Payer: Self-pay | Admitting: Internal Medicine

## 2020-02-04 DIAGNOSIS — K567 Ileus, unspecified: Secondary | ICD-10-CM | POA: Diagnosis not present

## 2020-02-04 LAB — GLUCOSE, CAPILLARY
Glucose-Capillary: 108 mg/dL — ABNORMAL HIGH (ref 70–99)
Glucose-Capillary: 119 mg/dL — ABNORMAL HIGH (ref 70–99)
Glucose-Capillary: 130 mg/dL — ABNORMAL HIGH (ref 70–99)
Glucose-Capillary: 142 mg/dL — ABNORMAL HIGH (ref 70–99)
Glucose-Capillary: 165 mg/dL — ABNORMAL HIGH (ref 70–99)
Glucose-Capillary: 195 mg/dL — ABNORMAL HIGH (ref 70–99)

## 2020-02-04 MED ORDER — OSMOLITE 1.2 CAL PO LIQD
1000.0000 mL | ORAL | Status: DC
  Administered 2020-02-04: 1000 mL
  Filled 2020-02-04: qty 1000

## 2020-02-04 MED ORDER — CEFAZOLIN SODIUM-DEXTROSE 2-4 GM/100ML-% IV SOLN
2.0000 g | INTRAVENOUS | Status: AC
  Administered 2020-02-05: 2 g via INTRAVENOUS
  Filled 2020-02-04: qty 100

## 2020-02-04 MED ORDER — KCL IN DEXTROSE-NACL 20-5-0.9 MEQ/L-%-% IV SOLN
INTRAVENOUS | Status: DC
  Filled 2020-02-04 (×7): qty 1000

## 2020-02-04 NOTE — Anesthesia Preprocedure Evaluation (Addendum)
Anesthesia Evaluation  Patient identified by MRN, date of birth, ID band Patient awake    Reviewed: Allergy & Precautions, H&P , NPO status , Patient's Chart, lab work & pertinent test results  Airway Mallampati: II   Neck ROM: full    Dental   Pulmonary Current Smoker,    breath sounds clear to auscultation       Cardiovascular  Rhythm:regular Rate:Normal     Neuro/Psych Seizures -,  negative psych ROS   GI/Hepatic (+)     substance abuse  , Hepatitis -, [id number]   Endo/Other    Renal/GU      Musculoskeletal   Abdominal   Peds  Hematology   Anesthesia Other Findings   Reproductive/Obstetrics                            Anesthesia Physical Anesthesia Plan  ASA: III  Anesthesia Plan: General   Post-op Pain Management:    Induction: Intravenous  PONV Risk Score and Plan: 3 and Ondansetron, Dexamethasone and Midazolam  Airway Management Planned: Oral ETT  Additional Equipment: None  Intra-op Plan:   Post-operative Plan: Extubation in OR  Informed Consent:   Plan Discussed with: CRNA  Anesthesia Plan Comments: (Lab Results      Component                Value               Date                      WBC                      4.8                 02/03/2020                HGB                      13.9                02/03/2020                HCT                      40.2                02/03/2020                MCV                      91.6                02/03/2020                PLT                      278                 02/03/2020            Lab Results      Component                Value               Date  CREATININE               0.53                02/03/2020                BUN                      12                  02/03/2020                NA                       136                 02/03/2020                K                        3.9                  02/03/2020                CL                       102                 02/03/2020                CO2                      28                  02/03/2020            Lab Results      Component                Value               Date                      INR                      1.0                 01/28/2020                INR                      1.1                 01/06/2020    Covid-19 Nucleic Acid Test Results Lab Results      Component                Value               Date                      SARSCOV2NAA              NEGATIVE            01/05/2020            EKG: normal sinus rhythm.  Echo: - Left ventricle: The cavity size was normal. Wall thickness was  normal. Systolic function was normal. The estimated ejection  fraction was in the range of 60% to 65%. Wall motion was normal;  there were no regional wall motion abnormalities.  - Aortic valve: Valve area (VTI): 2.01 cm^2. Valve area (Vmax):  1.76 cm^2.  - Atrial septum: No defect or patent foramen ovale was identified.)        Anesthesia Quick Evaluation

## 2020-02-04 NOTE — Progress Notes (Signed)
[facility] Progress Note  6 Days Post-Op  Subjective: Patient not responsive to me this AM but tolerating TF.   Objective: Vital signs in last 24 hours: Temp:  [97.6 F (36.4 C)-99 F (37.2 C)] 98.2 F (36.8 C) (10/14 0351) Pulse Rate:  [85-98] 91 (10/14 0351) Resp:  [16-20] 18 (10/14 0351) BP: (122-139)/(94-98) 122/94 (10/14 0351) SpO2:  [96 %-99 %] 96 % (10/14 0351) Last BM Date: 01/31/20  Intake/Output from previous day: 10/13 0701 - 10/14 0700 In: 10 [I.V.:10] Out: 1475 [Urine:1475] Intake/Output this shift: No intake/output data recorded.  PE: General: WD, chronically ill appearing female who is laying in bed in NAD Heart: regular, rate, and rhythm.  Lungs: CTAB, no wheezes, rhonchi, or rales noted.  Respiratory effort nonlabored Abd: soft, NT, ND, +BS Psych: catatonic   Lab Results:  Recent Labs    02/03/20 0131  WBC 4.8  HGB 13.9  HCT 40.2  PLT 278   BMET Recent Labs    02/03/20 0131  NA 136  K 3.9  CL 102  CO2 28  GLUCOSE 148*  BUN 12  CREATININE 0.53  CALCIUM 9.2   PT/INR No results for input(s): LABPROT, INR in the last 72 hours. CMP     Component Value Date/Time   NA 136 02/03/2020 0131   K 3.9 02/03/2020 0131   CL 102 02/03/2020 0131   CO2 28 02/03/2020 0131   GLUCOSE 148 (H) 02/03/2020 0131   BUN 12 02/03/2020 0131   CREATININE 0.53 02/03/2020 0131   CALCIUM 9.2 02/03/2020 0131   PROT 7.6 02/03/2020 0131   ALBUMIN 3.1 (L) 02/03/2020 0131   AST 80 (H) 02/03/2020 0131   ALT 121 (H) 02/03/2020 0131   ALKPHOS 86 02/03/2020 0131   BILITOT 0.6 02/03/2020 0131   GFRNONAA >60 02/03/2020 0131   GFRAA >60 01/25/2020 0308   Lipase     Component Value Date/Time   LIPASE 22 01/23/2017 1922       Studies/Results: DG Abd 2 Views  Result Date: 02/03/2020 CLINICAL DATA:  Abdominal distension. EXAM: ABDOMEN - 2 VIEW COMPARISON:  02/01/2020 and 01/28/2020 FINDINGS: A feeding tube remains in place with tip in the  expected region of the proximal duodenum. No intraperitoneal free air is identified. There is moderate gaseous distension of bowel loops throughout the abdomen and pelvis which appears to predominantly reflect colon and is than on 01/28/2020. Gas is present in the rectum. There are air-fluid levels in the colon. No acute osseous abnormality is seen. IMPRESSION: Increased diffuse gaseous distension of the colon which may reflect ileus. Electronically Signed   By: Sebastian Ache M.D.   On: 02/03/2020 14:37    Anti-infectives: Anti-infectives (From admission, onward)   Start     Dose/Rate Route Frequency Ordered Stop   01/29/20 0600  ceFAZolin (ANCEF) IVPB 2g/100 mL premix        2 g 200 mL/hr over 30 Minutes Intravenous On call to O.R. 01/28/20 1511 01/30/20 0559   01/28/20 2000  cefTRIAXone (ROCEPHIN) 1 g in sodium chloride 0.9 % 100 mL IVPB        1 g 200 mL/hr over 30 Minutes Intravenous Every 24 hours 01/28/20 1925 02/02/20 0724   01/06/20 0900  cefTRIAXone (ROCEPHIN) 2 g in sodium chloride 0.9 % 100 mL IVPB  Status:  Discontinued        2 g 200 mL/hr over 30 Minutes Intravenous Every 12 hours 01/06/20 0105 01/06/20 2202   01/06/20 0800  vancomycin (  VANCOCIN) IVPB 1000 mg/200 mL premix  Status:  Discontinued       "Followed by" Linked Group Details   1,000 mg 200 mL/hr over 60 Minutes Intravenous Every 12 hours 01/05/20 1904 01/06/20 2202   01/05/20 2000  vancomycin (VANCOREADY) IVPB 1500 mg/300 mL       "Followed by" Linked Group Details   1,500 mg 150 mL/hr over 120 Minutes Intravenous  Once 01/05/20 1904 01/06/20 0127   01/05/20 1830  cefTRIAXone (ROCEPHIN) 2 g in sodium chloride 0.9 % 100 mL IVPB        2 g 200 mL/hr over 30 Minutes Intravenous  Once 01/05/20 1829 01/05/20 2220       Assessment/Plan Hx polysubstance abuse Hepatitis C Acute urinary retention/UTI Yeast vaginitis Mild Malnutrition- Prealbumin 28.2 BMI 26.9  Dysphagia Acute persistent metabolic  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 - patient not interactive for me at all this AM - psych has seen and recommended weaning some medications - film yesterday was suggestive of ileus - abdomen soft and patient tolerating TF this AM, unclear if she received enema yesterday or not - palliative care saw 10/1 - family wanted to give patient 4-6 weeks but felt if there was no improvement at that point that patient would not want to live on tube feedings - [facility] is the primary decision maker for patient per palliative - [phone]  FEN: Cortrak for TF @65  cc/h ID: [email or web address] completed for UTI DVT: Lovenox F/U: TBD  LOS: 29 days    , [facility] 02/04/2020, 8:25 AM Please see Amion for pager number during day hours 7:00am-4:30pm

## 2020-02-04 NOTE — Progress Notes (Addendum)
[facility] PROGRESS NOTE  Anita Davis NWG:[id number] DOB: 10/27/1985 DOA: 01/04/2020 PCP: [name], PA-C  Status: Inpatient-Remains inpatient appropriate because:Altered mental status, Ongoing diagnostic testing needed not appropriate for outpatient work up and Unsafe d/c plan -plan for PEG tube placement on 10/5   Dispo: The patient is from: Home              Anticipated d/c is to: SNF              Anticipated d/c date is: > 3 days              Patient currently is not medically stable to d/c.  Due to UNSAFE DC PLAN-patient needs placement in skilled nursing facility based on altered mentation and inability to manage self-care.  She currently requires 24/7 care due to delay from presumptive diagnosis of autoimmune encephalitis.  She is currently bedbound and awaiting PEG tube placement patient does have a dysphagia 1 diet ordered but given her variable mentation she is inconsistently and unsafely able to manage oral intake.  Over the past 24 hours she has developed improving mentation that is waxing and waning and appears to indicate that patient may be eligible for inpatient rehabilitative services as opposed to SNF placement.  Patient is self-pay financial counseling aware that patient needs work-up for Medicaid and disability.  She may also need to have a family member established as her legal guardian.  10/4 TOC spoke with patient's aunt [name] and updated her on plans to have financial counseling review case and reach out to family regarding application for Medicaid, disability and guardianship is necessary.  [name] says she is currently guardian for another family member with TBI and is uncertain if she could manage to guardianship but will discuss with other members of the family.   Code Status: Full Family Communication: Updated [name] "[address]" [name] 10/8 DVT prophylaxis: Lovenox Vaccination status: Has not received Covid vaccine.  HPI: 34 y.o. F with  hx substance use disorder, active, hepatitis C, and gestational diabetes who presented with confusion.  Patient initially admitted in September with encephalopathy, thought to be from drug overdose.  LP showed no signs of infection. MRI brain unremarkable. She remained encephalopathic, and EEG showed focal seizures.  The seizures were suppressed, and she was transferred to [facility] for continuous EEG, but after seizure control, she had poor neurologic recovery.  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  She deteriorated after steroids were stopped, so IVIG was started.  Subjective: Eyes open -positive blink reflex-not tracking-drooling and noted with purposeful movements of the tongue and at the left hand to wipe face 2/2 drooling.  Family member who has custody of patient's children at bedside and updated on patient's status.  She plans to bring patient 20 year old son to bedside.  Plan is to have this family member stay in the room with a 32 year old due to expected emotional distress seeing his mother and current physical state.  Objective: Vitals:   02/04/20 0854 02/04/20 1128  BP: (!) 137/97 (!) 138/98  Pulse: 93 (!) 104  Resp: 17 17  Temp: 98.2 F (36.8 C) 98.5 F (36.9 C)  SpO2: 99% 98%    Intake/Output Summary (Last 24 hours) at 02/04/2020 1327 Last data filed at 02/04/2020 1220 Gross per 24 hour  Intake --  Output 1275 ml  Net -1275 ml   Filed Weights   01/30/20 0500 01/31/20 0417 02/01/20 0445  Weight: 76.4 kg 71.4 kg 69.1 kg    Exam:  Constitutional: NAD, calm, appears to be comfortable Respiratory: clear to auscultation bilaterally, Normal respiratory effort. No accessory muscle use.  Room air Cardiovascular: Regular rate and rhythm, no murmurs / rubs / gallops. No extremity edema. 2+ pedal pulses.   Abdomen: no tenderness patient, no masses palpated.  Does appear to be slightly distended  or bloated.  Bowel sounds positive but tingling primarily in the right mid quadrant.  Small bore feeding tube per right nare with tube feeding infusing Musculoskeletal: no clubbing / cyanosis. No joint deformity upper and lower extremities.no contractures.  No tremors or spasticity noted with passive range of motion Skin: no rashes, lesions, ulcers. 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   -The specific etiology remains unclear, but the differential for this appears to be an autoimmune encephalitis, or post status epilepticus encephalopathy.  TSH normal.  Neuraxial imaging unremarkable.  CT C/A/P shows no signs of cancer, paraneoplastic encephalitis seems unlikely.  The clinical picture is not consistent with a true psychiatric mediated catatonia, nor an opiate/alcohol withdrawal syndrome at this point (see below regarding psychiatry recommendations).    -Completed IVIG 10/1. Upon review of neurology notes they have documented that it may take several weeks before full effects/potential improvement from IVIG seen.  Will need reevaluation with neurologist 6 weeks from 01/22/2020 to determine if any significant improvements or whether this will be patient's baseline mentation. -Continue phenobarbital, Vimpat, and Dilantin-Dilantin level 2.9 on 10/6 therefore Dilantin changed from 100 mg TID to 200 mg BID; neuro recommends gradual down titration and subsequent discontinuation of AEDs after discharge if remains seizure-free -consider repeat EEG and MRI in 6 to 8 weeks after discharge -Evaluated by  psychiatry on 9/29-they added low-dose Seroquel but no improvement in symptoms therefore this was discontinued. -Xanax titrated and discontinued on 10/14 -Continue empiric thiamine and vitamin B6 replacement -During the entire hospitalization she has had waxing and waning mentation episodes of frank catatonia and unexplained episodes of alertness and nearly appropriate or awake and severely agitated followed by long periods of lethargy and catatonia-10/5 she was very alert, followed simple commands, was mostly oriented, had fluent speech although at times was perseverating.  But since that time has essentially remained unresponsive with limited engagement with staff and limited verbal responses. -10/6  EEG negative for seizure activity -10/7 Neuro dc'd Keppra since can worsen depression symptoms -Neurology documented on 10/11 that patient has a combination of neurological and psychiatric issues contributing to her nonverbal state and therefore no acute inpatient management from a neurology standpoint indicated prompting neurology to sign off.  Evaluated by psychiatry on 10/12 and recommended discontinuing previously ordered methadone and tapering and discontinuing Xanax.  Documented that sedatives such as scheduled diazepam or Seroquel unlikely to provide clinical benefit and would cloud the ability to observe for improvement in mentation.  No indication that undergoing anesthesia for surgical procedure would be contraindicated although anticipate would temporarily cloud patient's mentation until these medications had worn off.  Suspected chronic depression in context of ongoing polysubstance abuse prior to admission -Long discussion with patient's aunt.  Patient had been struggling with her emotions and with ongoing  substance abuse for years.  This had worsened over the past several months before admission.  Patient had recently lost custody of her children because of her substance abuse and in the month  prior to admission she began after increasing daily use of methamphetamine. -Given suspected underlying catatonia with psychiatric etiology neurology recommended resuming patient's preadmission methadone and adding a benzodiazepine.  When patient's home pharmacy called they stated she was not getting methadone from them but they typically do not supply methadone.  On 10/14 family member at bedside confirmed that patient was going to a methadone clinic in [facility] (she does not the name of this clinic) confirmed the patient was on high-dose methadone which is consistent with the 60 mg dosage documented by pharmacist at intake. -Patient doubt formal psychiatric diagnosis or diagnosis of depression prior to admission and was not on pharmacotherapy for depression prior to admission.    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  Surgery planned for 10/8 canceled due to abrupt change in mentation and has not yet been rescheduled. -During episode of increased awakeness on 10/5 SLP reevaluated patient but oral intake was inconsistent therefore recommended no change in current diet and to continue primary diet through feeding tube  Acute Urinary retention/E. coli UTI -10/5 bladder scan w/ > 700 cc so I/O cath ordered -cont q shift bladder scans -Urine culture positive for pansensitive E. Coli -Has completed 3 days of IV Rocephin  Mild hypokalemia -Potassium 3.9 on 10/13 -Continue per tube replacement of potassium -Follow labs PRN  Ileus probably due to constipation -Has had successful evacuation of  the colon with use of MiraLAX and magnesium citrate -Follow-up imaging on 10/13 revealed increase in colonic gas and other symptoms consistent with ileus.  No mention of stool burden in colon but air-fluid levels were present in the colon. -Core track tube in proximal duodenum -On clinical exam patient with tinkling bowel sounds right abdomen consistent with ileus.  Have opted to decrease tube feedings to 30 cc/h and add IV fluids at 50 cc/h -If psych and/or neurology recommend resuming preadmission methadone would need to hold until ileus resolved.  Nonobstructive transaminitis/hepatitis C -Elevated HCV antibody with markedly elevated HCV RNA quantitative level  consistent with chronic hepatitis C   Nutrition Status/moderate to severe protein calorie malnutrition: Nutrition Problem: Inadequate oral intake Etiology: lethargy/confusion Signs/Symptoms: meal completion < 25% Interventions: Tube feeding, Prostat Estimated body mass index is 25.35 kg/m as calculated from the following:   Height as of this encounter: 5\' 5"  (1.651 m).   Weight as of this encounter: 69.1 kg.  Yeast vaginitis -Completed intravaginal Monistat    Data Reviewed: Basic Metabolic Panel: Recent Labs  Lab 01/30/20 0259 02/01/20 0107 02/03/20 0131  NA 137 135 136  K 3.5 3.5 3.9  CL 104 101 102  CO2 24 25 28   GLUCOSE 159* 125* 148*  BUN 19 8 12   CREATININE 0.55 0.61 0.53  CALCIUM 8.6* 8.8* 9.2  MG 2.1 2.0 2.2  PHOS 3.3 3.4 3.6   Liver Function Tests: Recent Labs  Lab 01/30/20 0259 02/01/20 0107 02/03/20 0131  AST 71* 80* 80*  ALT 141* 123* 121*  ALKPHOS 68 73 86  BILITOT 0.2* 0.7 0.6  PROT 7.8 7.7 7.6  ALBUMIN 2.9* 3.1* 3.1*   No results for input(s): LIPASE, AMYLASE in the last 168 hours. No results for input(s): AMMONIA in the last 168 hours. CBC: Recent Labs  Lab 01/30/20 0259 02/01/20 0107 02/03/20 0131  WBC 4.9 5.3 4.8  NEUTROABS 1.9 2.8 2.0  HGB 12.0 13.2 13.9  HCT 36.3 39.3  40.2  MCV 93.3 92.3 91.6  PLT 280 302 278   Cardiac Enzymes: No results for input(s): CKTOTAL, CKMB, CKMBINDEX, TROPONINI in the last 168 hours. BNP (last 3 results) No results for input(s): BNP in the last 8760 hours.  ProBNP (last 3 results) No results for input(s): PROBNP in the last 8760 hours.  CBG: Recent Labs  Lab 02/03/20 2106 02/03/20 2355 02/04/20 0349 02/04/20 0832 02/04/20 1131  GLUCAP 140* 165* 142* 195* 119*    Recent Results (from the past 240 hour(s))  Culture, Urine     Status: Abnormal   Collection Time: 01/28/20  4:22 PM   Specimen: Urine, Random  Result Value Ref Range Status   Specimen Description URINE, RANDOM  Final   Special Requests   Final    NONE Performed at [facility], [address] [place], [place] [address]    Culture >=100,000 COLONIES/mL ESCHERICHIA COLI (A)  Final   Report Status 01/30/2020 FINAL  Final   Organism ID, Bacteria ESCHERICHIA COLI (A)  Final      Susceptibility   Escherichia coli - MIC*    AMPICILLIN <=2 SENSITIVE Sensitive     CEFAZOLIN <=4 SENSITIVE Sensitive     CEFTRIAXONE <=0.25 SENSITIVE Sensitive     CIPROFLOXACIN <=0.25 SENSITIVE Sensitive     GENTAMICIN <=1 SENSITIVE Sensitive     IMIPENEM <=0.25 SENSITIVE Sensitive     NITROFURANTOIN <=16 SENSITIVE Sensitive     TRIMETH/SULFA <=20 SENSITIVE Sensitive     AMPICILLIN/SULBACTAM <=2 SENSITIVE Sensitive     PIP/TAZO <=4 SENSITIVE Sensitive     * >=100,000 COLONIES/mL ESCHERICHIA COLI  Culture, Urine     Status: None   Collection Time: 01/29/20  5:08 PM   Specimen: Urine, Catheterized  Result Value Ref Range Status   Specimen Description URINE, CATHETERIZED  Final   Special Requests Normal  Final   Culture   Final    NO GROWTH Performed at [facility], [address] [place], [place] [address]    Report Status 01/30/2020 FINAL  Final     Studies: DG Abd 2 Views  Result Date: 02/03/2020 CLINICAL DATA:  Abdominal distension. EXAM:  ABDOMEN - 2 VIEW COMPARISON:  02/01/2020 and 01/28/2020 FINDINGS: A feeding tube remains in place with tip in the expected region of the proximal duodenum. No intraperitoneal free air is identified. There is moderate gaseous distension of bowel loops throughout the abdomen and pelvis which appears to predominantly reflect colon and is than on 01/28/2020. Gas is present in the rectum. There are air-fluid levels in the colon. No acute osseous abnormality is seen. IMPRESSION: Increased diffuse gaseous distension of the colon which may reflect ileus. Electronically Signed   By: Sebastian AcheAllen  Grady M.D.   On: 02/03/2020 14:37    Scheduled Meds: . amantadine  100 mg Per Tube BID  . Chlorhexidine Gluconate Cloth  6 each Topical Daily  . enoxaparin (LOVENOX) injection  40 mg Subcutaneous Q24H  . famotidine  20 mg Per Tube Daily  . feeding supplement (PROSource TF)  45 mL  Per Tube Daily  . free water  200 mL Per Tube Q6H  . influenza vac split quadrivalent PF  0.5 mL Intramuscular Tomorrow-1000  . insulin aspart  0-9 Units Subcutaneous TID WC  . lacosamide  200 mg Per Tube BID  . lactulose  20 g Per Tube QHS  . mouth rinse  15 mL Mouth Rinse BID  . PHENObarbital  60 mg Per Tube BID  . phenytoin  200 mg Per Tube BID  . polyethylene glycol  17 g Per Tube Daily  . potassium chloride  40 mEq Per Tube Daily  . vitamin B-6  100 mg Per Tube Daily  . senna  1 tablet Per Tube QHS  . sodium chloride flush  10-40 mL Intracatheter Q12H  . thiamine  100 mg Per Tube Daily   Continuous Infusions: . sodium chloride 50 mL/hr at 02/03/20 1145  . [START ON 02/05/2020]  ceFAZolin (ANCEF) IV    . dextrose 5 % and 0.9 % NaCl with KCl 20 mEq/L 50 mL/hr at 02/04/20 0942  . feeding supplement (OSMOLITE 1.2 CAL) 1,000 mL (02/04/20 0904)    Principal Problem:   Refractory seizure (HCC) Active Problems:   Acute metabolic encephalopathy   Hypokalemia   Polysubstance abuse (HCC)   Elevated CK   Transaminitis   Chronic  hepatitis C without hepatic coma (HCC)   Distended abdomen   Palliative care encounter   Colonic Ileus [facility])   Consultants:  Neurology  Psychiatry  Interventional radiology  Surgery  Procedures:  9/14 lumbar puncture  9/15 EEG  9/16 EEG  9/17 EEG  9/19 overnight EEG with video  9/22 overnight EEG with video with discontinuation of long-term EEG monitoring on 9/25  9/24 core track placement  10/6 EEG  Antibiotics: Anti-infectives (From admission, onward)   Start     Dose/Rate Route Frequency Ordered Stop   02/05/20 0600  ceFAZolin (ANCEF) IVPB 2g/100 mL premix        2 g 200 mL/hr over 30 Minutes Intravenous To Short Stay 02/04/20 1054 02/06/20 0600   01/29/20 0600  ceFAZolin (ANCEF) IVPB 2g/100 mL premix        2 g 200 mL/hr over 30 Minutes Intravenous On call to O.R. 01/28/20 1511 01/30/20 0559   01/28/20 2000  cefTRIAXone (ROCEPHIN) 1 g in sodium chloride 0.9 % 100 mL IVPB        1 g 200 mL/hr over 30 Minutes Intravenous Every 24 hours 01/28/20 1925 02/02/20 0724   01/06/20 0900  cefTRIAXone (ROCEPHIN) 2 g in sodium chloride 0.9 % 100 mL IVPB  Status:  Discontinued        2 g 200 mL/hr over 30 Minutes Intravenous Every 12 hours 01/06/20 0105 01/06/20 2202   01/06/20 0800  vancomycin (VANCOCIN) IVPB 1000 mg/200 mL premix  Status:  Discontinued       "Followed by" Linked Group Details   1,000 mg 200 mL/hr over 60 Minutes Intravenous Every 12 hours 01/05/20 1904 01/06/20 2202   01/05/20 2000  vancomycin (VANCOREADY) IVPB 1500 mg/300 mL       "Followed by" Linked Group Details   1,500 mg 150 mL/hr over 120 Minutes Intravenous  Once 01/05/20 1904 01/06/20 0127   01/05/20 1830  cefTRIAXone (ROCEPHIN) 2 g in sodium chloride 0.9 % 100 mL IVPB        2 g 200 mL/hr over 30 Minutes Intravenous  Once 01/05/20 1829 01/05/20 2220       Time spent: 25  [name] ANP  [facility] Pager [phone]. If 7PM-7AM, please contact night-coverage at  [email or web address] 02/04/2020, 1:27 PM  LOS: 29 days

## 2020-02-05 ENCOUNTER — Inpatient Hospital Stay (HOSPITAL_COMMUNITY): Payer: Medicaid Other | Admitting: Anesthesiology

## 2020-02-05 ENCOUNTER — Encounter (HOSPITAL_COMMUNITY)
Admission: EM | Disposition: A | Discharge status: Home Health | Payer: Self-pay | Source: Home / Self Care | Attending: Family Medicine

## 2020-02-05 DIAGNOSIS — Z66 Do not resuscitate: Secondary | ICD-10-CM | POA: Diagnosis not present

## 2020-02-05 DIAGNOSIS — T43691A Poisoning by other psychostimulants, accidental (unintentional), initial encounter: Secondary | ICD-10-CM | POA: Diagnosis not present

## 2020-02-05 DIAGNOSIS — Z20822 Contact with and (suspected) exposure to covid-19: Secondary | ICD-10-CM | POA: Diagnosis not present

## 2020-02-05 DIAGNOSIS — G9341 Metabolic encephalopathy: Secondary | ICD-10-CM | POA: Diagnosis not present

## 2020-02-05 HISTORY — PX: LAPAROSCOPIC GASTROSTOMY: SHX5896

## 2020-02-05 LAB — GLUCOSE, CAPILLARY
Glucose-Capillary: 100 mg/dL — ABNORMAL HIGH (ref 70–99)
Glucose-Capillary: 123 mg/dL — ABNORMAL HIGH (ref 70–99)
Glucose-Capillary: 124 mg/dL — ABNORMAL HIGH (ref 70–99)
Glucose-Capillary: 125 mg/dL — ABNORMAL HIGH (ref 70–99)
Glucose-Capillary: 131 mg/dL — ABNORMAL HIGH (ref 70–99)
Glucose-Capillary: 99 mg/dL (ref 70–99)

## 2020-02-05 LAB — HCG, QUANTITATIVE, PREGNANCY: hCG, Beta Chain, Quant, S: 1 m[IU]/mL (ref <5)

## 2020-02-05 SURGERY — CREATION, GASTROSTOMY, LAPAROSCOPIC
Anesthesia: General | Site: Abdomen

## 2020-02-05 MED ORDER — DEXAMETHASONE SODIUM PHOSPHATE 10 MG/ML IJ SOLN
INTRAMUSCULAR | Status: DC | PRN
  Administered 2020-02-05: 5 mg via INTRAVENOUS

## 2020-02-05 MED ORDER — KETOROLAC TROMETHAMINE 30 MG/ML IJ SOLN
INTRAMUSCULAR | Status: DC | PRN
  Administered 2020-02-05: 30 mg via INTRAVENOUS

## 2020-02-05 MED ORDER — ROCURONIUM BROMIDE 10 MG/ML (PF) SYRINGE
PREFILLED_SYRINGE | INTRAVENOUS | Status: AC
  Filled 2020-02-05: qty 10

## 2020-02-05 MED ORDER — PROPOFOL 10 MG/ML IV BOLUS
INTRAVENOUS | Status: DC | PRN
  Administered 2020-02-05: 120 mg via INTRAVENOUS

## 2020-02-05 MED ORDER — CHLORHEXIDINE GLUCONATE 0.12 % MT SOLN
15.0000 mL | Freq: Once | OROMUCOSAL | Status: AC
  Administered 2020-02-05: 15 mL via OROMUCOSAL
  Filled 2020-02-05: qty 15

## 2020-02-05 MED ORDER — ONDANSETRON HCL 4 MG/2ML IJ SOLN
INTRAMUSCULAR | Status: AC
  Filled 2020-02-05: qty 2

## 2020-02-05 MED ORDER — SUCCINYLCHOLINE CHLORIDE 200 MG/10ML IV SOSY
PREFILLED_SYRINGE | INTRAVENOUS | Status: AC
  Filled 2020-02-05: qty 10

## 2020-02-05 MED ORDER — BUPIVACAINE-EPINEPHRINE 0.25% -1:200000 IJ SOLN
INTRAMUSCULAR | Status: DC | PRN
  Administered 2020-02-05: 20 mL

## 2020-02-05 MED ORDER — DEXAMETHASONE SODIUM PHOSPHATE 10 MG/ML IJ SOLN
INTRAMUSCULAR | Status: AC
  Filled 2020-02-05: qty 1

## 2020-02-05 MED ORDER — ROCURONIUM BROMIDE 10 MG/ML (PF) SYRINGE
PREFILLED_SYRINGE | INTRAVENOUS | Status: DC | PRN
  Administered 2020-02-05: 20 mg via INTRAVENOUS

## 2020-02-05 MED ORDER — KETOROLAC TROMETHAMINE 15 MG/ML IJ SOLN
15.0000 mg | Freq: Four times a day (QID) | INTRAMUSCULAR | Status: AC | PRN
  Administered 2020-02-05 – 2020-02-10 (×4): 15 mg via INTRAVENOUS
  Filled 2020-02-05 (×4): qty 1

## 2020-02-05 MED ORDER — ONDANSETRON HCL 4 MG/2ML IJ SOLN
INTRAMUSCULAR | Status: DC | PRN
  Administered 2020-02-05: 4 mg via INTRAVENOUS

## 2020-02-05 MED ORDER — FENTANYL CITRATE (PF) 100 MCG/2ML IJ SOLN
INTRAMUSCULAR | Status: AC
  Filled 2020-02-05: qty 2

## 2020-02-05 MED ORDER — LABETALOL HCL 5 MG/ML IV SOLN
INTRAVENOUS | Status: AC
  Filled 2020-02-05: qty 4

## 2020-02-05 MED ORDER — SUGAMMADEX SODIUM 200 MG/2ML IV SOLN
INTRAVENOUS | Status: DC | PRN
  Administered 2020-02-05 (×2): 100 mg via INTRAVENOUS

## 2020-02-05 MED ORDER — DEXMEDETOMIDINE (PRECEDEX) IN NS 20 MCG/5ML (4 MCG/ML) IV SYRINGE
PREFILLED_SYRINGE | INTRAVENOUS | Status: DC | PRN
  Administered 2020-02-05 (×2): 8 ug via INTRAVENOUS
  Administered 2020-02-05: 4 ug via INTRAVENOUS

## 2020-02-05 MED ORDER — LIDOCAINE 2% (20 MG/ML) 5 ML SYRINGE
INTRAMUSCULAR | Status: AC
  Filled 2020-02-05: qty 5

## 2020-02-05 MED ORDER — ORAL CARE MOUTH RINSE: 15.0000 mL | Freq: Once | OROMUCOSAL | Status: AC

## 2020-02-05 MED ORDER — FENTANYL CITRATE (PF) 250 MCG/5ML IJ SOLN
INTRAMUSCULAR | Status: DC | PRN
  Administered 2020-02-05: 100 ug via INTRAVENOUS
  Administered 2020-02-05: 50 ug via INTRAVENOUS
  Administered 2020-02-05: 100 ug via INTRAVENOUS

## 2020-02-05 MED ORDER — LIDOCAINE 2% (20 MG/ML) 5 ML SYRINGE
INTRAMUSCULAR | Status: DC | PRN
  Administered 2020-02-05: 40 mg via INTRAVENOUS

## 2020-02-05 MED ORDER — LACTATED RINGERS IV SOLN: INTRAVENOUS | Status: DC

## 2020-02-05 MED ORDER — MIDAZOLAM HCL 2 MG/2ML IJ SOLN
INTRAMUSCULAR | Status: AC
  Filled 2020-02-05: qty 2

## 2020-02-05 MED ORDER — OXYCODONE HCL 5 MG/5ML PO SOLN: 5.0000 mg | Freq: Once | ORAL | Status: DC | PRN

## 2020-02-05 MED ORDER — MIDAZOLAM HCL 5 MG/5ML IJ SOLN
INTRAMUSCULAR | Status: DC | PRN
  Administered 2020-02-05: 2 mg via INTRAVENOUS

## 2020-02-05 MED ORDER — SODIUM CHLORIDE 0.9 % IR SOLN
Status: DC | PRN
  Administered 2020-02-05: 1000 mL

## 2020-02-05 MED ORDER — FENTANYL CITRATE (PF) 250 MCG/5ML IJ SOLN
INTRAMUSCULAR | Status: AC
  Filled 2020-02-05: qty 5

## 2020-02-05 MED ORDER — SUCCINYLCHOLINE CHLORIDE 200 MG/10ML IV SOSY
PREFILLED_SYRINGE | INTRAVENOUS | Status: DC | PRN
  Administered 2020-02-05: 140 mg via INTRAVENOUS

## 2020-02-05 MED ORDER — BUPIVACAINE-EPINEPHRINE (PF) 0.25% -1:200000 IJ SOLN
INTRAMUSCULAR | Status: AC
  Filled 2020-02-05: qty 20

## 2020-02-05 MED ORDER — ROCURONIUM BROMIDE 10 MG/ML (PF) SYRINGE
PREFILLED_SYRINGE | INTRAVENOUS | Status: AC
  Filled 2020-02-05: qty 30

## 2020-02-05 MED ORDER — OXYCODONE HCL 5 MG PO TABS: 5.0000 mg | ORAL_TABLET | Freq: Once | ORAL | Status: DC | PRN

## 2020-02-05 MED ORDER — ORAL CARE MOUTH RINSE: 15.0000 mL | Freq: Once | OROMUCOSAL | Status: DC

## 2020-02-05 MED ORDER — PROPOFOL 10 MG/ML IV BOLUS
INTRAVENOUS | Status: AC
  Filled 2020-02-05: qty 20

## 2020-02-05 MED ORDER — FENTANYL CITRATE (PF) 100 MCG/2ML IJ SOLN
25.0000 ug | INTRAMUSCULAR | Status: DC | PRN
  Administered 2020-02-05 (×2): 50 ug via INTRAVENOUS

## 2020-02-05 MED ORDER — LABETALOL HCL 5 MG/ML IV SOLN
INTRAVENOUS | Status: DC | PRN
  Administered 2020-02-05 (×4): 5 mg via INTRAVENOUS

## 2020-02-05 MED ORDER — KETOROLAC TROMETHAMINE 30 MG/ML IJ SOLN
INTRAMUSCULAR | Status: AC
  Filled 2020-02-05: qty 1

## 2020-02-05 MED ORDER — OXYCODONE HCL 5 MG PO TABS
5.0000 mg | ORAL_TABLET | ORAL | Status: DC | PRN
  Administered 2020-02-12 – 2020-02-17 (×7): 5 mg via ORAL
  Filled 2020-02-05 (×7): qty 1

## 2020-02-05 MED ORDER — DEXMEDETOMIDINE (PRECEDEX) IN NS 20 MCG/5ML (4 MCG/ML) IV SYRINGE
PREFILLED_SYRINGE | INTRAVENOUS | Status: AC
  Filled 2020-02-05: qty 5

## 2020-02-05 MED ORDER — CHLORHEXIDINE GLUCONATE 0.12 % MT SOLN: 15.0000 mL | Freq: Once | OROMUCOSAL | Status: DC

## 2020-02-05 MED ORDER — ONDANSETRON HCL 4 MG/2ML IJ SOLN: 4.0000 mg | Freq: Four times a day (QID) | INTRAMUSCULAR | Status: DC | PRN

## 2020-02-05 SURGICAL SUPPLY — 42 items
BAG URINE DRAINAGE (UROLOGICAL SUPPLIES) IMPLANT
BENZOIN TINCTURE PRP APPL 2/3 (GAUZE/BANDAGES/DRESSINGS) ×2 IMPLANT
BINDER ABDOMINAL 12 ML 46-62 (SOFTGOODS) ×1 IMPLANT
BLADE CLIPPER SURG (BLADE) IMPLANT
CANISTER SUCT 3000ML PPV (MISCELLANEOUS) ×2 IMPLANT
CATH FOLEY 2WAY 5CC 20FR (CATHETERS) IMPLANT
CHLORAPREP W/TINT 26 (MISCELLANEOUS) ×2 IMPLANT
COVER SURGICAL LIGHT HANDLE (MISCELLANEOUS) ×2 IMPLANT
COVER WAND RF STERILE (DRAPES) ×2 IMPLANT
DECANTER SPIKE VIAL GLASS SM (MISCELLANEOUS) ×2 IMPLANT
DEVICE TROCAR PUNCTURE CLOSURE (ENDOMECHANICALS) ×1 IMPLANT
DRAPE LAPAROSCOPIC ABDOMINAL (DRAPES) ×2 IMPLANT
ELECT REM PT RETURN 9FT ADLT (ELECTROSURGICAL) ×2
ELECTRODE REM PT RTRN 9FT ADLT (ELECTROSURGICAL) ×1 IMPLANT
GLOVE BIOGEL M STRL SZ7.5 (GLOVE) ×2 IMPLANT
GLOVE INDICATOR 8.0 STRL GRN (GLOVE) ×4 IMPLANT
GOWN STRL REUS W/ TWL LRG LVL3 (GOWN DISPOSABLE) ×3 IMPLANT
GOWN STRL REUS W/TWL 2XL LVL3 (GOWN DISPOSABLE) ×2 IMPLANT
GOWN STRL REUS W/TWL LRG LVL3 (GOWN DISPOSABLE) ×2
KIT BASIN OR (CUSTOM PROCEDURE TRAY) ×2 IMPLANT
KIT TURNOVER KIT B (KITS) ×2 IMPLANT
NS IRRIG 1000ML POUR BTL (IV SOLUTION) ×2 IMPLANT
PAD ARMBOARD 7.5X6 YLW CONV (MISCELLANEOUS) ×4 IMPLANT
PLUG CATH AND CAP STER (CATHETERS) IMPLANT
SCISSORS ENDO CVD 5DCS (MISCELLANEOUS) ×1 IMPLANT
SCISSORS LAP 5X35 DISP (ENDOMECHANICALS) IMPLANT
SET IRRIG TUBING LAPAROSCOPIC (IRRIGATION / IRRIGATOR) IMPLANT
SET TUBE SMOKE EVAC HIGH FLOW (TUBING) ×2 IMPLANT
SPECIMEN JAR SMALL (MISCELLANEOUS) ×2 IMPLANT
SUT ETHILON 2 0 FS 18 (SUTURE) ×3 IMPLANT
SUT MNCRL AB 4-0 PS2 18 (SUTURE) ×1 IMPLANT
SUT SILK 2 0 SH (SUTURE) ×1 IMPLANT
SUT VIC AB 4-0 PS2 27 (SUTURE) ×2 IMPLANT
TOWEL GREEN STERILE (TOWEL DISPOSABLE) ×2 IMPLANT
TOWEL GREEN STERILE FF (TOWEL DISPOSABLE) ×2 IMPLANT
TRAY LAPAROSCOPIC MC (CUSTOM PROCEDURE TRAY) ×2 IMPLANT
TROCAR XCEL BLUNT TIP 100MML (ENDOMECHANICALS) IMPLANT
TROCAR XCEL NON BLADE 8MM B8LT (ENDOMECHANICALS) ×1 IMPLANT
TROCAR XCEL NON-BLD 11X100MML (ENDOMECHANICALS) IMPLANT
TROCAR XCEL NON-BLD 5MMX100MML (ENDOMECHANICALS) ×6 IMPLANT
TUBE GASTROSTOMY 18F (CATHETERS) ×1 IMPLANT
WATER STERILE IRR 1000ML POUR (IV SOLUTION) ×2 IMPLANT

## 2020-02-05 NOTE — Plan of Care (Signed)
  Problem: Nutrition: Goal: Adequate nutrition will be maintained Outcome: Progressing   Problem: Activity: Goal: Risk for activity intolerance will decrease Outcome: Progressing   Problem: Coping: Goal: Level of anxiety will decrease Outcome: Progressing   Problem: Pain Managment: Goal: General experience of comfort will improve Outcome: Progressing   Problem: Education: Goal: Expressions of having a comfortable level of knowledge regarding the disease process will increase Outcome: Progressing   Problem: Medication: Goal: Risk for medication side effects will decrease Outcome: Progressing

## 2020-02-05 NOTE — Anesthesia Postprocedure Evaluation (Signed)
Anesthesia Post Note  Patient: Anita Davis  Procedure(s) Performed: LAPAROSCOPIC GASTROSTOMY PLACEMENT (N/A Abdomen)     Patient location during evaluation: PACU Anesthesia Type: General Level of consciousness: awake Pain management: pain level controlled Vital Signs Assessment: post-procedure vital signs reviewed and stable Respiratory status: spontaneous breathing, nonlabored ventilation, respiratory function stable and patient connected to nasal cannula oxygen Cardiovascular status: blood pressure returned to baseline and stable Postop Assessment: no apparent nausea or vomiting Anesthetic complications: no   No complications documented.  Last Vitals:  Vitals:   02/05/20 1132 02/05/20 1206  BP: (!) 137/104 (!) 148/117  Pulse: 80 86  Resp: 16 20  Temp: 36.7 C 36.9 C  SpO2: 96% 98%    Last Pain:  Vitals:   02/05/20 1206  TempSrc: Oral  PainSc:                  Elsey Holts S

## 2020-02-05 NOTE — Brief Op Note (Signed)
02/05/2020  10:34 AM  PATIENT:  Anita Davis  34 y.o. 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; need for enteral access  POST-OPERATIVE DIAGNOSIS:  Same PROCEDURE:  Procedure(s): LAPAROSCOPIC GASTROSTOMY TUBE PLACEMENT (N/A) (18 Fr Mic g ube)  SURGEON:  Surgeon(s) and Role:    Gaynelle Adu, MD - Primary  PHYSICIAN ASSISTANT: none  ASSISTANTS: Meagan Dove RNFA   ANESTHESIA:   general  EBL:  15 mL   BLOOD ADMINISTERED:none  DRAINS: Gastrostomy Tube   LOCAL MEDICATIONS USED:  MARCAINE     SPECIMEN:  No Specimen  DISPOSITION OF SPECIMEN:  N/A  COUNTS:  YES  TOURNIQUET:  * No tourniquets in log *  DICTATION: .Other Dictation: Dictation Number [id number]  PLAN OF CARE: Admit to inpatient   PATIENT DISPOSITION:  PACU - hemodynamically stable.   Delay start of Pharmacological VTE agent (>24hrs) due to surgical blood loss or risk of bleeding: no  Mary Sella. Andrey Campanile, MD, FACS General, Bariatric, & Minimally Invasive Surgery [facility], [place]

## 2020-02-05 NOTE — Progress Notes (Addendum)
[facility] PROGRESS NOTE  Anita Davis ZTI:[id number] DOB: Dec 13, 1985 DOA: 01/04/2020 PCP: [name], PA-C  Status: Inpatient-Remains inpatient appropriate because:Altered mental status, Ongoing diagnostic testing needed not appropriate for outpatient work up and Unsafe d/c plan -plan for PEG tube placement on 10/5   Dispo: The patient is from: Home              Anticipated d/c is to: SNF              Anticipated d/c date is: > 3 days              Patient currently is not medically stable to d/c.  Due to UNSAFE DC PLAN-patient needs placement in skilled nursing facility based on altered mentation and inability to manage self-care.  She currently requires 24/7 care due to delay from presumptive diagnosis of autoimmune encephalitis.  She is currently bedbound and awaiting PEG tube placement patient does have a dysphagia 1 diet ordered but given her variable mentation she is inconsistently and unsafely able to manage oral intake.  Over the past 24 hours she has developed improving mentation that is waxing and waning and appears to indicate that patient may be eligible for inpatient rehabilitative services as opposed to SNF placement.  Patient is self-pay financial counseling aware that patient needs work-up for Medicaid and disability.  She may also need to have a family member established as her legal guardian.  10/4 TOC spoke with patient's aunt Anita Davis and updated her on plans to have financial counseling review case and reach out to family regarding application for Medicaid, disability and guardianship is necessary.  [name] says she is currently guardian for another family member with TBI and is uncertain if she could manage to guardianship but will discuss with other members of the family.  10/15 patient examined after G-tube placement.  The surgeon told me that she actually awakened in the preoperative area and has remained awake after surgical procedure.  Close family  friend who has custody of her children as well as her teenage son at bedside.  Patient interacting verbally and visually.  Crying at times.  Remains confused.  When psychiatric consultation offered she agreed.   Code Status: Full Family Communication: Updated Anita Davis "[address] Anita Davis 10/8-son and family friend at bedside on 10/15 DVT prophylaxis: Lovenox Vaccination status: Has not received Covid vaccine.  HPI: 34 y.o. F with hx substance use disorder, active, hepatitis C, and gestational diabetes who presented with confusion.  Patient initially admitted in September with encephalopathy, thought to be from drug overdose.  LP showed no signs of infection. MRI brain unremarkable. She remained encephalopathic, and EEG showed focal seizures.  The seizures were suppressed, and she was transferred to [facility] for continuous EEG, but after seizure control, she had poor neurologic recovery.  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  She deteriorated after steroids were stopped, so IVIG was started.  Subjective: Patient alert, confused and quite talkative and emotional at times.  Continues to exhibit short-term memory deficits.  States she does not want to go back to "that prison".  Objective: Vitals:   02/05/20 1132 02/05/20 1206  BP: (!) 137/104 (!) 148/117  Pulse: 80 86  Resp: 16 20  Temp: 98 F (36.7 C) 98.4 F (36.9 C)  SpO2: 96% 98%    Intake/Output Summary (Last 24 hours) at 02/05/2020 1329 Last data filed at 02/05/2020 1132 Gross per 24 hour  Intake 9923.95 ml  Output 1965 ml  Net 7958.95 ml   Filed Weights   01/31/20 0417 02/01/20 0445 02/05/20 0606  Weight: 71.4 kg 69.1 kg 76.8 kg    Exam:  Constitutional: NAD, awake and emotional at time Respiratory: clear to auscultation bilaterally, Normal respiratory effort. No accessory muscle use.  Room air Cardiovascular: Regular rate and rhythm, no  murmurs / rubs / gallops. No extremity edema. 2+ pedal pulses.   Abdomen: no tenderness patient, no masses palpated.  Does appear to be slightly distended or bloated.  Bowel sounds positive but tingling primarily in the right mid quadrant.  Small bore feeding tube per right nare with tube feeding infusing Musculoskeletal: no clubbing / cyanosis. No joint deformity upper and lower extremities.no contractures.  No tremors or spasticity  Skin: no rashes, lesions, ulcers. No induration Neurologic: CN 2-12 grossly intact, all extremities x4 purposefully.  Sensation intact.  No focal neurological deficits appreciated. Psychiatric: Alert, oriented times name only but does recognize family are at bedside.  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   -The specific etiology remains unclear, but the differential for this appears to be an autoimmune encephalitis, or post status epilepticus encephalopathy.  TSH normal.  Neuraxial imaging unremarkable.  CT C/A/P shows no signs of cancer, paraneoplastic encephalitis seems unlikely.  The clinical picture is not consistent with a true psychiatric mediated catatonia, nor an opiate/alcohol withdrawal syndrome at this point (see below regarding psychiatry recommendations).    -Completed IVIG 10/1. Upon review of neurology notes they have documented that it may take several weeks before full effects/potential improvement from IVIG seen.  Will need reevaluation with neurologist 6 weeks from 01/22/2020 to determine if any significant improvements or whether this will be patient's baseline mentation. -of note, autoimmune studies performed on CSF fluid and [facility] -Continue phenobarbital, Vimpat, and Dilantin-Dilantin level  2.9 on 10/6 therefore Dilantin changed from 100 mg TID to 200 mg BID; neuro recommends gradual down titration and subsequent discontinuation of AEDs after discharge if remains seizure-free -consider repeat EEG and MRI in 6 to 8 weeks after discharge -Evaluated by psychiatry on 9/29-they added low-dose Seroquel but no improvement in symptoms therefore this was discontinued. -Xanax titrated and discontinued on 10/14 -Continue empiric thiamine and vitamin B6 replacement -During the entire hospitalization she has had waxing and waning mentation episodes of frank catatonia and unexplained episodes of alertness and nearly appropriate or awake and severely agitated followed by long periods of lethargy and catatonia-10/5 she was very alert, followed simple commands, was mostly oriented, had fluent speech although at times was perseverating.  But since that time has essentially remained unresponsive with limited engagement with staff and limited verbal responses. -10/6  EEG negative for seizure activity -10/7 Neuro dc'd Keppra since can worsen depression symptoms -Neurology documented on 10/11 that patient has a combination of neurological and psychiatric issues contributing to her nonverbal state and therefore no acute inpatient management from a neurology standpoint indicated prompting neurology to sign off.  Evaluated by psychiatry on 10/12 and recommended discontinuing previously ordered methadone and tapering and discontinuing Xanax.  Documented that sedatives such as scheduled diazepam or Seroquel unlikely to provide clinical benefit and would cloud the ability to observe for improvement in mentation.  No indication that undergoing anesthesia for surgical procedure would be  contraindicated although anticipate would temporarily cloud patient's mentation until these medications had worn off.  Suspected chronic depression in context of ongoing polysubstance abuse prior to admission -Long discussion with  patient's aunt.  Patient had been struggling with her emotions and with ongoing substance abuse for years.  This had worsened over the past several months before admission.  Patient had recently lost custody of her children because of her substance abuse and in the month prior to admission she began after increasing daily use of methamphetamine. -Given suspected underlying catatonia with psychiatric etiology neurology recommended resuming patient's preadmission methadone and adding a benzodiazepine.  When patient's home pharmacy called they stated she was not getting methadone from them but they typically do not supply methadone.  On 10/14 family member at bedside confirmed that patient was going to a methadone clinic in [facility] (she does not the name of this clinic) confirmed the patient was on high-dose methadone which is consistent with the 60 mg dosage documented by pharmacist at intake. -10/15 once again patient has awakened quite alert and interactive with family and staff.  Emotional at times and agreeable to psych consultation.  While discussing her unresponsive time patient made an interesting statement replying "all those men were talking to me and I just did not want to talk back".  If remains alert and able to participate in therapies may need to reconsider CIR.  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  Surgery planned for 10/8 canceled due to abrupt change in mentation and has not yet been rescheduled. -During episode of increased awakeness on 10/5 SLP reevaluated patient but oral intake was  inconsistent therefore recommended no change in current diet and to continue primary diet through feeding tube -Patient very alert today.  Discussed with patient and family that given degree of altered mentation recently that would need to ensure that swallowing appropriate before advancing diet i.e. she will need to continue to work with SLP but in interim received feedings per tube.  Surgeon states can use tube tonight for medications and can start feedings in a.m.  Acute Urinary retention/E. coli UTI -10/5 bladder scan w/ > 700 cc so I/O cath ordered -cont q shift bladder scans -Urine culture positive for pansensitive E. Coli -Has completed 3 days of IV Rocephin  Mild hypokalemia -Potassium 3.9 on 10/13 -Continue per tube replacement of potassium -Follow labs PRN  Ileus probably due to constipation -Has had successful evacuation of the colon with use of MiraLAX and magnesium citrate -Follow-up imaging on 10/13 revealed increase in colonic gas and other symptoms consistent with ileus.  No mention of stool burden in colon but air-fluid levels were present in the colon. -Core track tube in proximal duodenum -On clinical exam patient with tinkling bowel sounds right abdomen consistent with ileus.  Have opted to decrease tube feedings to 30 cc/h and add IV fluids at 50 cc/h -If psych and/or neurology recommend resuming preadmission methadone would need to hold until ileus resolved.  Nonobstructive transaminitis/hepatitis C -Elevated HCV antibody with markedly elevated HCV RNA quantitative level  consistent with chronic hepatitis C   Nutrition Status/moderate to severe protein calorie malnutrition: Nutrition Problem: Inadequate  oral intake Etiology: lethargy/confusion Signs/Symptoms: meal completion < 25% e following:   Height as of this encounter: 5\' 5"  (1.651 m).   Weight as of this  encounter: 76.8 kg.  Yeast vaginitis -Completed intravaginal Monistat    Data Reviewed: Basic Metabolic Panel: Recent Labs  Lab 01/30/20 0259 02/01/20 0107 02/03/20 0131  NA 137 135 136  K 3.5 3.5 3.9  CL 104 101 102  CO2 GLUCOSE 159* 125* 148*  BUN CREATININE 0.55 0.61 0.53  CALCIUM 8.6* 8.8* 9.2  MG 2.1 2.0 2.2  PHOS 3.3 3.4 3.6   Liver Function Tests: Recent Labs  Lab 01/30/20 0259 02/01/20 0107 02/03/20 0131  AST 71* 80* 80*  ALT 141* 123* 121*  ALKPHOS 68 73 86  BILITOT 0.2* 0.7 0.6  PROT 7.8 7.7 7.6  ALBUMIN 2.9* 3.1* 3.1*   No results for input(s): LIPASE, AMYLASE in the last 168 hours. No results for input(s): AMMONIA in the last 168 hours. CBC: Recent Labs  Lab 01/30/20 0259 02/01/20 0107 02/03/20 0131  WBC 4.9 5.3 4.8  NEUTROABS 1.9 2.8 2.0  HGB 12.0 13.2 13.9  HCT 36.3 39.3 40.2  MCV 93.3 92.3 91.6  PLT 280 302 278   Cardiac Enzymes: No results for input(s): CKTOTAL, CKMB, CKMBINDEX, TROPONINI in the last 168 hours. BNP (last 3 results) No results for input(s): BNP in the last 8760 hours.  ProBNP (last 3 results) No results for input(s): PROBNP in the last 8760 hours.  CBG: Recent Labs  Lab 02/04/20 2037 02/04/20 2351 02/05/20 0345 02/05/20 0735 02/05/20 1210  GLUCAP 108* 165* 123* 131* 125*    Recent Results (from the past 240 hour(s))  Culture, Urine     Status: Abnormal   Collection Time: 01/28/20  4:22 PM   Specimen: Urine, Random  Result Value Ref Range Status   Specimen Description URINE, RANDOM  Final   Special Requests   Final    NONE Performed at [facility], [address] [place], [place] [address]    Culture >=100,000 COLONIES/mL ESCHERICHIA COLI (A)  Final   Report Status 01/30/2020 FINAL  Final   Organism ID, Bacteria ESCHERICHIA COLI (A)  Final      Susceptibility   Escherichia coli - MIC*    AMPICILLIN <=2 SENSITIVE Sensitive     CEFAZOLIN <=4 SENSITIVE Sensitive      CEFTRIAXONE <=0.25 SENSITIVE Sensitive     CIPROFLOXACIN <=0.25 SENSITIVE Sensitive     GENTAMICIN <=1 SENSITIVE Sensitive     IMIPENEM <=0.25 SENSITIVE Sensitive     NITROFURANTOIN <=16 SENSITIVE Sensitive     TRIMETH/SULFA <=20 SENSITIVE Sensitive     AMPICILLIN/SULBACTAM <=2 SENSITIVE Sensitive     PIP/TAZO <=4 SENSITIVE Sensitive     * >=100,000 COLONIES/mL ESCHERICHIA COLI  Culture, Urine     Status: None   Collection Time: 01/29/20  5:08 PM   Specimen: Urine, Catheterized  Result Value Ref Range Status   Specimen Description URINE, CATHETERIZED  Final   Special Requests Normal  Final   Culture   Final    NO GROWTH Performed at [facility], [address] [place], [place] [address]    Report Status 01/30/2020 FINAL  Final     Studies: No results found.  Scheduled Meds: . amantadine  100 mg Per Tube BID  . Chlorhexidine Gluconate Cloth  6  each Topical Daily  . enoxaparin (LOVENOX) injection  40 mg Subcutaneous Q24H  . famotidine  20 mg Per Tube Daily  . fentaNYL      . free water  200 mL Per Tube Q6H  . influenza vac split quadrivalent PF  0.5 mL Intramuscular Tomorrow-1000  . insulin aspart  0-9 Units Subcutaneous TID WC  . lacosamide  200 mg Per Tube BID  . lactulose  20 g Per Tube QHS  . PHENObarbital  60 mg Per Tube BID  . phenytoin  200 mg Per Tube BID  . polyethylene glycol  17 g Per Tube Daily  . potassium chloride  40 mEq Per Tube Daily  . vitamin B-6  100 mg Per Tube Daily  . senna  1 tablet Per Tube QHS  . thiamine  100 mg Per Tube Daily   Continuous Infusions: . dextrose 5 % and 0.9 % NaCl with KCl 20 mEq/L Stopped (02/05/20 1048)    Principal Problem:   Refractory seizure (HCC) Active Problems:   Acute metabolic encephalopathy   Hypokalemia   Polysubstance abuse (HCC)   Elevated CK   Transaminitis   Chronic hepatitis C without hepatic coma (HCC)   Distended abdomen   Palliative care encounter   Colonic Ileus  [facility])   Consultants:  Neurology  Psychiatry  Interventional radiology  Surgery  Procedures:  9/14 lumbar puncture  9/15 EEG  9/16 EEG  9/17 EEG  9/19 overnight EEG with video  9/22 overnight EEG with video with discontinuation of long-term EEG monitoring on 9/25  9/24 core track placement  10/6 EEG  Antibiotics: Anti-infectives (From admission, onward)   Start     Dose/Rate Route Frequency Ordered Stop   02/05/20 0600  ceFAZolin (ANCEF) IVPB 2g/100 mL premix        2 g 200 mL/hr over 30 Minutes Intravenous To Short Stay 02/04/20 1054 02/05/20 0950   01/29/20 0600  ceFAZolin (ANCEF) IVPB 2g/100 mL premix        2 g 200 mL/hr over 30 Minutes Intravenous On call to O.R. 01/28/20 1511 01/30/20 0559   01/28/20 2000  cefTRIAXone (ROCEPHIN) 1 g in sodium chloride 0.9 % 100 mL IVPB        1 g 200 mL/hr over 30 Minutes Intravenous Every 24 hours 01/28/20 1925 02/02/20 0724   01/06/20 0900  cefTRIAXone (ROCEPHIN) 2 g in sodium chloride 0.9 % 100 mL IVPB  Status:  Discontinued        2 g 200 mL/hr over 30 Minutes Intravenous Every 12 hours 01/06/20 0105 01/06/20 2202   01/06/20 0800  vancomycin (VANCOCIN) IVPB 1000 mg/200 mL premix  Status:  Discontinued       "Followed by" Linked Group Details   1,000 mg 200 mL/hr over 60 Minutes Intravenous Every 12 hours 01/05/20 1904 01/06/20 2202   01/05/20 2000  vancomycin (VANCOREADY) IVPB 1500 mg/300 mL       "Followed by" Linked Group Details   1,500 mg 150 mL/hr over 120 Minutes Intravenous  Once 01/05/20 1904 01/06/20 0127   01/05/20 1830  cefTRIAXone (ROCEPHIN) 2 g in sodium chloride 0.9 % 100 mL IVPB        2 g 200 mL/hr over 30 Minutes Intravenous  Once 01/05/20 1829 01/05/20 2220       Time spent: 20    [name] ANP  [facility] Pager [phone]. If 7PM-7AM, please contact night-coverage at [email or web address] 02/05/2020, 1:29 PM  LOS: 30 days

## 2020-02-05 NOTE — Op Note (Signed)
NAMEKAHO, SELLE MEDICAL RECORD [id number] ACCOUNT [id number] DATE OF BIRTH:20-Nov-1985 FACILITY: [facility] LOCATION: [facility] PHYSICIAN:Ardine Iacovelli Elson Clan, MD  OPERATIVE REPORT  DATE OF PROCEDURE:  02/05/2020  PREOPERATIVE DIAGNOSES:   1.  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 2.  Need for enteral access.  POSTOPERATIVE DIAGNOSES:   1.  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 2.  Need for enteral access.  PROCEDURE:  Laparoscopic gastrotomy tube placement -- 18-French MIC gastrostomy tube.  SURGEON:  Gaynelle Adu, MD   ASSISTANT:  Clenton Pare, RNFA.  ANESTHESIA:  General.  ESTIMATED BLOOD LOSS:  Minimal.  DRAINS:  An 18-French G-tube.  INDICATIONS:  The patient is a 34 year old female who has been in the hospital since September with initially presumed metabolic encephalopathy due to autoimmune encephalitis.  She has had an extensive workup, multiple imaging studies of her brain and  whole body, as well as neurology and psychiatric consultation.  It is unclear her exact diagnosis, but the patient will have long periods of catatonia and lethargy and be nonverbal.  She has been supplemented on enteral feeds via a Cortrak tube in order  to facilitate placement, a gastrotomy tube was requested.  She was not felt to be a candidate for IR placement due to the colon overlying the stomach, so therefore surgery was consulted.  I discussed the risks and benefits of the procedure with the  patient's aunt since the patient was nonverbal with me on several occasions.  Risks and benefits are separately documented.  DESCRIPTION OF PROCEDURE:  After obtaining informed consent from the patient's aunt, she was brought to OR #1 at [facility], placed supine on the operating table.  Interestingly, while she was being transferred to the OR table and getting ready  to undergo general anesthesia, she was verbal and interactive with the hospital staff.  General  endotracheal anesthesia was established.  Sequential compression devices were placed.  The patient had an indwelling Foley catheter.  Her abdomen was prepped  and draped in the usual standard surgical fashion with ChloraPrep.  She was given IV Ancef prior to skin incision.  Surgical timeout was performed.  I decided to gain access to the abdomen using the Optiview technique in the right upper quadrant.  A small incision was made in the right upper quadrant just below the right subcostal margin.  A 0-degree 5 mm laparoscope was advanced through all layers  of the abdominal wall through a 5 mm trocar and the abdominal cavity was carefully entered.  A pneumoperitoneum was smoothly established up to a patient pressure of 15 mmHg.  There was no evidence of injury to surrounding structures.  It should be noted  that I did draw a line in her left upper quadrant about 2 fingerbreadths below the left subcostal margin prior to insufflating the abdomen for G-tube planning location purposes.  The colon was not overlying the stomach.  An 8 mm trocar was placed in the  right midabdomen, a 5 mm trocar was placed to the left of the umbilicus, all of this under direct visualization.  Local was infiltrated along the left lateral abdominal wall as a TAP block.  I identified an area along the greater curvature of the stomach  in the distal stomach where I felt I could reach the abdominal wall in the left upper quadrant.  I then placed two 2-0 silk pursestring sutures along the anterior wall of the stomach along the greater curve in the distal body of the stomach.  I then  made a gastrotomy with hook electrocautery.  I was able to confirm that I was in the stomach by the stomach being decompressed of air as I entered it.  I then obtained an 18-French G-tube and confirmed that the balloon was working with all air.  I then  placed a 5 mm trocar in the left upper quadrant under direct visualization near the midclavicular line,  about 2 fingerbreadths below the left costal margin.  I then removed that trocar.  I then threaded the G-tube through the abdominal wall into the  abdominal cavity.  It was then advanced into the gastrostomy, into the stomach.  The balloon was insufflated and it did not leak.  The balloon was deflated.  I then tied down  the inner pursestring, followed by the outer pursestring with laparoscopic  needle drivers and removed those 2 needles from the abdomen.  I then placed 3 interrupted 2-0 silk sutures at the 11 o'clock, 1 o'clock and 6 o'clock position just outside the pursestring suture to serve as a transfascial Stamm suture.  I then brought up  each of the sutures through the abdominal wall in the left upper quadrant using an EndoClose suture device and each were secured with a hemostat.  We reduced the pneumoperitoneum to 8 mmHg.  The balloon was inflated with 8 mL of saline.  It did not  leak.  The G-tube was then pulled up against the abdominal wall, along with the transfascial silk sutures.  The 3 Stamm transfascial sutures were tied down, thus securing the stomach against the anterior abdominal wall.  Those needles had been previously  removed.  I then placed the 8 mm trocar site with a 0 Vicryl using an EndoClose suture device.  Local was infiltrated in this area.  Pneumoperitoneum was released.  The bolster was secured to the skin with 2 interrupted 2-0 nylon sutures.  All skin  incisions were then closed with a 4-0 Monocryl, followed by the application of Dermabond at the 3 remaining trocar sites, along with the 3 stab incisions where the transfascial sutures had been secured underneath the skin.  All needle, instrument, sponge  counts were correct x2.  There were no immediate complications.  The patient was extubated and taken to the recovery room in stable condition.  She tolerated the procedure well.  VN/NUANCE  D:02/05/2020 T:02/05/2020 JOB:[id number]

## 2020-02-05 NOTE — Progress Notes (Signed)
G tube can be used for meds today Hold off on tube feeds til Saturday - we will see on Sat and confirm tube ok to use for feeds Keep pt in loose abd binder to protect tube  Mary Sella. Andrey Campanile, MD, FACS General, Bariatric, & Minimally Invasive Surgery [facility], [place]

## 2020-02-05 NOTE — Plan of Care (Signed)
Pt has been alert. Pt only responds when she feels like talking. Pt has been spitting on herself. RN gave her a towel and eventually wiped her mouth and then continued to spit.  TF via coretrak and patent. TF turned off at midnight for procedure.   Problem: Education: Goal: Knowledge of General Education information will improve Description: Including pain rating scale, medication(s)/side effects and non-pharmacologic comfort measures Outcome: Progressing   Problem: Health Behavior/Discharge Planning: Goal: Ability to manage health-related needs will improve Outcome: Progressing   Problem: Clinical Measurements: Goal: Ability to maintain clinical measurements within normal limits will improve Outcome: Progressing Goal: Will remain free from infection Outcome: Progressing Goal: Diagnostic test results will improve Outcome: Progressing Goal: Respiratory complications will improve Outcome: Progressing Goal: Cardiovascular complication will be avoided Outcome: Progressing   Problem: Activity: Goal: Risk for activity intolerance will decrease Outcome: Progressing   Problem: Nutrition: Goal: Adequate nutrition will be maintained Outcome: Progressing   Problem: Coping: Goal: Level of anxiety will decrease Outcome: Progressing   Problem: Elimination: Goal: Will not experience complications related to bowel motility Outcome: Progressing Goal: Will not experience complications related to urinary retention Outcome: Progressing   Problem: Pain Managment: Goal: General experience of comfort will improve Outcome: Progressing   Problem: Safety: Goal: Ability to remain free from injury will improve Outcome: Progressing   Problem: Skin Integrity: Goal: Risk for impaired skin integrity will decrease Outcome: Progressing   Problem: Education: Goal: Expressions of having a comfortable level of knowledge regarding the disease process will increase Outcome: Progressing   Problem:  Coping: Goal: Ability to adjust to condition or change in health will improve Outcome: Progressing Goal: Ability to identify appropriate support needs will improve Outcome: Progressing   Problem: Health Behavior/Discharge Planning: Goal: Compliance with prescribed medication regimen will improve Outcome: Progressing   Problem: Medication: Goal: Risk for medication side effects will decrease Outcome: Progressing   Problem: Clinical Measurements: Goal: Complications related to the disease process, condition or treatment will be avoided or minimized Outcome: Progressing Goal: Diagnostic test results will improve Outcome: Progressing   Problem: Safety: Goal: Verbalization of understanding the information provided will improve Outcome: Progressing   Problem: Self-Concept: Goal: Level of anxiety will decrease Outcome: Progressing Goal: Ability to verbalize feelings about condition will improve Outcome: Progressing

## 2020-02-05 NOTE — H&P (View-Only) (Signed)
Spoke with Anita Davis  Discussed risk/benefits of lap g tube with her.  Discussed the rationale for the G-tube  Discussed risk and benefits including not limited to bleeding, infection, injury to surrounding structures, blood clot formation, cardiac and pulmonary events, mentation concerns, G-tube issues such as dislodgment, irritation of the skin, conversion to open,  Discussed that if the G-tube is dislodged within the first month sometimes it can be replaced by interventional radiology sometimes requires a trip back to the operating room.  Discussed that the tube does not initially have to be permanent and that it can be removed in the future.  All of her questions were asked and answered.  She agrees with proceeding with procedure  Mary Sella. Andrey Campanile, MD, FACS General, Bariatric, & Minimally Invasive Surgery [facility], [place]

## 2020-02-05 NOTE — Interval H&P Note (Signed)
History and Physical Interval Note:  02/05/2020 8:14 AM  Anita Davis  has presented today for surgery, with the diagnosis of DYSPHAGIA CATATONIA.  The various methods of treatment have been discussed with the patient and family. After consideration of risks, benefits and other options for treatment, the patient has consented to  Procedure(s): LAPAROSCOPIC GASTROSTOMY PLACEMENT (N/A) as a surgical intervention.  The patient's history has been reviewed, patient examined, no change in status, stable for surgery.  I have reviewed the patient's chart and labs.  Questions were answered to the family members satisfaction.     Gaynelle Adu

## 2020-02-05 NOTE — TOC Progression Note (Signed)
Transition of Care [facility]) - Progression Note    Patient Details  Name: Anita Davis MRN: [id number] Date of Birth: [birth year]  Transition of [facility]) CM/SW Contact  Baldemar Lenis, [place] Phone Number: 02/05/2020, 12:45 PM  Clinical Narrative:   CSW noting that patient has received peg today, waiting to see if patient will tolerate feeds through peg tube. Financial counseling still working with family to complete and submit Medicaid and Disability applications. Once applications are pending, LOG placement can be secured. CSW to follow.    Expected Discharge Plan: Skilled Nursing Facility Barriers to Discharge: Continued Medical Work up, Inadequate or no insurance, Active Substance Use - Placement, SNF Pending payor source - LOG, SNF Pending Medicaid, SNF Pending bed offer  Expected Discharge Plan and Services Expected Discharge Plan: Skilled Nursing Facility In-house Referral: Financial Counselor   Post Acute Care Choice: Skilled Nursing Facility Living arrangements for the past 2 months: Mobile Home                                       Social Determinants of Health (SDOH) Interventions    Readmission Risk Interventions No flowsheet data found.

## 2020-02-05 NOTE — Progress Notes (Signed)
OT Cancellation Note  [name] MRN: [id number] DOB: 05/18/1985   Cancelled Treatment:    Reason Eval/Treat Not Completed: Patient at procedure or test/ unavailable.  Robet Leu 02/05/2020, 11:12 AM

## 2020-02-05 NOTE — Transfer of Care (Signed)
Immediate Anesthesia Transfer of Care Note  Patient: Anita Davis  Procedure(s) Performed: LAPAROSCOPIC GASTROSTOMY PLACEMENT (N/A Abdomen)  Patient Location: PACU  Anesthesia Type:General  Level of Consciousness: awake  Airway & Oxygen Therapy: Patient Spontanous Breathing  Post-op Assessment: Report given to RN and Post -op Vital signs reviewed and stable  Post vital signs: Reviewed and stable  Last Vitals:  Vitals Value Taken Time  BP 124/93 02/05/20 1048  Temp    Pulse 73 02/05/20 1048  Resp 13 02/05/20 1048  SpO2 94 % 02/05/20 1048  Vitals shown include unvalidated device data.  Last Pain:  Vitals:   02/05/20 0821  TempSrc: Oral  PainSc:       Patients Stated Pain Goal: 2 (02/05/20 0848)  Complications: No complications documented.

## 2020-02-05 NOTE — Anesthesia Procedure Notes (Signed)
Procedure Name: Intubation Date/Time: 02/05/2020 9:17 AM Performed by: Trinna Post., CRNA Pre-anesthesia Checklist: Patient identified, Emergency Drugs available, Suction available, Patient being monitored and Timeout performed Patient Re-evaluated:Patient Re-evaluated prior to induction Oxygen Delivery Method: Circle system utilized Preoxygenation: Pre-oxygenation with 100% oxygen Induction Type: IV induction, Rapid sequence and Cricoid Pressure applied Ventilation: Mask ventilation without difficulty Laryngoscope Size: Mac and 3 Grade View: Grade I Tube type: Oral Tube size: 7.0 mm Number of attempts: 1 Airway Equipment and Method: Stylet Placement Confirmation: ETT inserted through vocal cords under direct vision,  positive ETCO2 and breath sounds checked- equal and bilateral Secured at: 22 cm Tube secured with: Tape Dental Injury: Teeth and Oropharynx as per pre-operative assessment

## 2020-02-05 NOTE — Progress Notes (Signed)
2 mg IV ativan given to patient at 1238 for agitation.  Patient is resting at this time. Will continue to monitor.

## 2020-02-05 NOTE — Progress Notes (Signed)
Spoke with Aunt [name] [name]  Discussed risk/benefits of lap g tube with her.  Discussed the rationale for the G-tube  Discussed risk and benefits including not limited to bleeding, infection, injury to surrounding structures, blood clot formation, cardiac and pulmonary events, mentation concerns, G-tube issues such as dislodgment, irritation of the skin, conversion to open,  Discussed that if the G-tube is dislodged within the first month sometimes it can be replaced by interventional radiology sometimes requires a trip back to the operating room.  Discussed that the tube does not initially have to be permanent and that it can be removed in the future.  All of her questions were asked and answered.  She agrees with proceeding with procedure  Shmiel Morton M. Faria Casella, MD, FACS General, Bariatric, & Minimally Invasive Surgery [facility] [place]  

## 2020-02-06 ENCOUNTER — Encounter (HOSPITAL_COMMUNITY): Payer: Self-pay | Admitting: General Surgery

## 2020-02-06 LAB — GLUCOSE, CAPILLARY
Glucose-Capillary: 117 mg/dL — ABNORMAL HIGH (ref 70–99)
Glucose-Capillary: 125 mg/dL — ABNORMAL HIGH (ref 70–99)
Glucose-Capillary: 123 mg/dL — ABNORMAL HIGH (ref 70–99)
Glucose-Capillary: 123 mg/dL — ABNORMAL HIGH (ref 70–99)
Glucose-Capillary: 115 mg/dL — ABNORMAL HIGH (ref 70–99)

## 2020-02-06 MED ORDER — OSMOLITE 1.2 CAL PO LIQD
1000.0000 mL | ORAL | Status: DC
  Administered 2020-02-06 – 2020-02-14 (×6): 1000 mL
  Filled 2020-02-06 (×6): qty 1000

## 2020-02-06 NOTE — Consult Note (Signed)
Client non-verbal on assessment.  Waited for nurse to give her medications.  Whenever a question was asked, she would put out her tongue and salvia came out.  Unable to get any other response, will try again tomorrow.  Nanine Means, PMHNP

## 2020-02-06 NOTE — Final Consult Note (Signed)
. Consultant Final Sign-Off Note    Assessment/Final recommendations  Anita Davis is a 34 y.o. 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   Patient is s/p Laparoscopic gastrotomy tube placement -- 18-French MIC gastrostomy tube by Dr. Andrey Campanile on 10/15.   Okay to start tube feeds from our standpoint. Please flush tube with 30 ml (20 ml if fluid restricted) sterile water every 4 hours, before and after medication administration, and when continuous feeding are interrupted. Rate of tube feeds per primary team. Please keep abdominal binder in place. Thank you for allowing [place] to participate in the care of your patient!  Please consult [place] again if you have further needs for your patient.  Wound care (if applicable):    Diet at discharge: per primary team   Activity at discharge: After Laparoscopic procedures we recommend no lifting greater than 15lbs for 2 weeks and no lifting >40 lbs for 4 weeks.   Follow-up appointment:  PRN   Pending results:  Unresulted Labs (From admission, onward)          Start     Ordered   02/05/20 1101  Pregnancy, urine  ONCE - STAT,   STAT        02/05/20 1100   Signed and Held  Basic metabolic panel  Tomorrow morning,   R       Question:  Specimen collection method  Answer:  Lab=Lab collect   Signed and Held           Medication recommendations:   Other recommendations:    Thank you for allowing [place] to participate in the care of your patient!  Please consult [place] again if you have further needs for your patient.  Elmer Sow [facility] 02/06/2020 12:06 PM    Subjective   Patient awake but not responsive to questioning this am  Objective  Vital signs in last 24 hours: Temp:  [97.6 F (36.4 C)-98.8 F (37.1 C)] 98.8 F (37.1 C) (10/16 1122) Pulse Rate:  [82-101] 83 (10/16 1122) Resp:  [18-20] 18 (10/16 1122) BP: (111-145)/(77-95) 112/91 (10/16 1122) SpO2:  [98 %-100 %] 99 % (10/16 1122)  Gen:  WD, lying in bed in NAD Lungs: Normal rate and effort Abd: Soft, ND, NT, no rigidity or guarding. +BS. PEG tube in place w/ abdominal binder over.  Pertinent labs and Studies: No results for input(s): WBC, HGB, HCT in the last 72 hours. BMET No results for input(s): NA, K, CL, CO2, GLUCOSE, BUN, CREATININE, CALCIUM in the last 72 hours. No results for input(s): LABURIN in the last 72 hours. Results for orders placed or performed during the hospital encounter of 01/04/20  SARS Coronavirus 2 by RT PCR (hospital order, performed in [facility] hospital lab) Nasopharyngeal Nasopharyngeal Swab     Status: None   Collection Time: 01/05/20 10:08 AM   Specimen: Nasopharyngeal Swab  Result Value Ref Range Status   SARS Coronavirus 2 NEGATIVE NEGATIVE Final    Comment: (NOTE) SARS-CoV-2 target nucleic acids are NOT DETECTED.  The SARS-CoV-2 RNA is generally detectable in upper and lower respiratory specimens during the acute phase of infection. The lowest concentration of SARS-CoV-2 viral copies this assay can detect is 250 copies / mL. A negative result does not preclude SARS-CoV-2 infection and should not be used as the sole basis for treatment or other patient management decisions.  A negative result may occur with improper specimen collection / handling, submission of specimen other than nasopharyngeal swab,  presence of viral mutation(s) within the areas targeted by this assay, and inadequate number of viral copies (<250 copies / mL). A negative result must be combined with clinical observations, patient history, and epidemiological information.  Fact Sheet for Patients:   BoilerBrush.com.cy  Fact Sheet for Healthcare Providers: https://pope.com/  This test is not yet approved or  cleared by the [place] FDA and has been authorized for detection and/or diagnosis of SARS-CoV-2 by FDA under an Emergency Use Authorization (EUA).  This  EUA will remain in effect (meaning this test can be used) for the duration of the COVID-19 declaration under Section 564(b)(1) of the Act, 21 U.S.C. section 360bbb-3(b)(1), unless the authorization is terminated or revoked sooner.  Performed at [facility], [address] [place], [place] [address]   CSF culture     Status: None   Collection Time: 01/05/20  6:28 PM   Specimen: Lumbar Puncture; Cerebrospinal Fluid  Result Value Ref Range Status   Specimen Description   Final    LUMBAR Performed at [facility], [address], [place], [place] [address]    Special Requests   Final    NONE Performed at [facility], [address] [place], [place] [address]    Culture   Final    NO GROWTH Performed at [facility], [address], [place], [place] [address]    Report Status 01/09/2020 FINAL  Final  Blood culture (routine x 2)     Status: None   Collection Time: 01/05/20  9:15 PM   Specimen: BLOOD LEFT HAND  Result Value Ref Range Status   Specimen Description BLOOD LEFT HAND  Final   Special Requests   Final    BOTTLES DRAWN AEROBIC AND ANAEROBIC Blood Culture adequate volume   Culture   Final    NO GROWTH 5 DAYS Performed at [facility], [address], [place], [place] [address]    Report Status 01/10/2020 FINAL  Final  Blood culture (routine x 2)     Status: None   Collection Time: 01/05/20  9:18 PM   Specimen: BLOOD LEFT ARM  Result Value Ref Range Status   Specimen Description BLOOD LEFT ARM  Final   Special Requests   Final    BOTTLES DRAWN AEROBIC AND ANAEROBIC Blood Culture adequate volume   Culture   Final    NO GROWTH 5 DAYS Performed at [facility], [address], [place], [place] [address]    Report Status 01/10/2020 FINAL  Final  Gram stain     Status: None   Collection Time: 01/05/20  9:24 PM   Specimen: CSF  Result Value Ref Range Status   Specimen Description CSF  Final   Special Requests NONE  Final   Gram Stain   Final    CYTOSPIN  SMEAR NO WBC SEEN NO ORGANISMS SEEN Performed at [facility], [address], [place], [place] [address]    Report Status 01/05/2020 FINAL  Final  MRSA PCR Screening     Status: Abnormal   Collection Time: 01/06/20  5:12 PM   Specimen: Nasal Mucosa; Nasopharyngeal  Result Value Ref Range Status   MRSA by PCR POSITIVE (A) NEGATIVE Corrected    Comment: TETREAULT,H @0638  BY HUFFINES,S 9.16.2021         The GeneXpert MRSA Assay (FDA approved for NASAL specimens only), is one component of a comprehensive MRSA colonization surveillance program. It is not intended to diagnose MRSA infection nor to guide or monitor treatment  for MRSA infections. Performed at [facility], [address] [place], [place] [address] CORRECTED ON 09/16 AT 0654: PREVIOUSLY REPORTED AS POSITIVE        The GeneXpert MRSA Assay (FDA approved for NASAL specimens only), is one component of a comprehensive MRSA colonization surveillance program. It is not intended to diagnose MRSA infection  nor to guide or monitor treatment for MRSA infections. RESULT CALLED TO, READ BACK BY AND VERIFIED WITH: TETREAULT,H   Culture, Urine     Status: Abnormal   Collection Time: 01/28/20  4:22 PM   Specimen: Urine, Random  Result Value Ref Range Status   Specimen Description URINE, RANDOM  Final   Special Requests   Final    NONE Performed at [facility], [address], [place], [place] [address]    Culture >=100,000 COLONIES/mL ESCHERICHIA COLI (A)  Final   Report Status 01/30/2020 FINAL  Final   Organism ID, Bacteria ESCHERICHIA COLI (A)  Final      Susceptibility   Escherichia coli - MIC*    AMPICILLIN <=2 SENSITIVE Sensitive     CEFAZOLIN <=4 SENSITIVE Sensitive     CEFTRIAXONE <=0.25 SENSITIVE Sensitive     CIPROFLOXACIN <=0.25 SENSITIVE Sensitive     GENTAMICIN <=1 SENSITIVE Sensitive     IMIPENEM <=0.25 SENSITIVE Sensitive     NITROFURANTOIN <=16 SENSITIVE Sensitive     TRIMETH/SULFA <=20 SENSITIVE  Sensitive     AMPICILLIN/SULBACTAM <=2 SENSITIVE Sensitive     PIP/TAZO <=4 SENSITIVE Sensitive     * >=100,000 COLONIES/mL ESCHERICHIA COLI  Culture, Urine     Status: None   Collection Time: 01/29/20  5:08 PM   Specimen: Urine, Catheterized  Result Value Ref Range Status   Specimen Description URINE, CATHETERIZED  Final   Special Requests Normal  Final   Culture   Final    NO GROWTH Performed at [facility], [address], [place], [place] [address]    Report Status 01/30/2020 FINAL  Final    Imaging: No results found.

## 2020-02-06 NOTE — Progress Notes (Signed)
[facility] PROGRESS NOTE  Anita Davis GEX:[id number] DOB: [birth year] DOA: 01/04/2020 PCP: [name], PA-C  Status: Inpatient-Remains inpatient appropriate because:Altered mental status, Ongoing diagnostic testing needed not appropriate for outpatient work up and Unsafe d/c plan -plan for PEG tube placement on 10/5   Dispo: The patient is from: Home              Anticipated d/c is to: SNF              Anticipated d/c date is: > 3 days              Patient currently is not medically stable to d/c.  Due to UNSAFE DC PLAN-patient needs placement in skilled nursing facility based on altered mentation and inability to manage self-care.  She currently requires 24/7 care due to delay from presumptive diagnosis of autoimmune encephalitis.  She is currently bedbound and awaiting PEG tube placement patient does have a dysphagia 1 diet ordered but given her variable mentation she is inconsistently and unsafely able to manage oral intake.  Over the past 24 hours she has developed improving mentation that is waxing and waning and appears to indicate that patient may be eligible for inpatient rehabilitative services as opposed to SNF placement.  Patient is self-pay financial counseling aware that patient needs work-up for Medicaid and disability.  She may also need to have a family member established as her legal guardian.  10/4 TOC spoke with patient's aunt [name] and updated her on plans to have financial counseling review case and reach out to family regarding application for Medicaid, disability and guardianship is necessary.  [name] says she is currently guardian for another family member with TBI and is uncertain if she could manage to guardianship but will discuss with other members of the family.  10/15 patient examined after G-tube placement.  The surgeon told me that she actually awakened in the preoperative area and has remained awake after surgical procedure.  Close family  friend who has custody of her children as well as her teenage son at bedside.  Patient interacting verbally and visually.  Crying at times.  Remains confused.  When psychiatric consultation offered she agreed.   Code Status: Full Family Communication: Updated [name] "[address] [name] 10/8-son and family friend at bedside on 10/15 DVT prophylaxis: Lovenox Vaccination status: Has not received Covid vaccine.  HPI: 34 y.o. F with hx substance use disorder, active, hepatitis C, and gestational diabetes who presented with confusion.  Patient initially admitted in September with encephalopathy, thought to be from drug overdose.  LP showed no signs of infection. MRI brain unremarkable. She remained encephalopathic, and EEG showed focal seizures.  The seizures were suppressed, and she was transferred to [facility] for continuous EEG, but after seizure control, she had poor neurologic recovery.  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  She deteriorated after steroids were stopped, so IVIG was started.  Subjective: Seen and examined.  Eyes open and sleeping on the left side of the bed.  Not participating in any sort of communication.  Her posture was giving the impression that she was having high tone however on my examination, she had normal tone.  Did not talk to me at all.  Objective: Vitals:   02/06/20 0736 02/06/20 1122  BP: (!) 145/86 (!) 112/91  Pulse: 82 83  Resp: 20 18  Temp: 97.7 F (36.5 C) 98.8 F (37.1 C)  SpO2: 99%  99%    Intake/Output Summary (Last 24 hours) at 02/06/2020 1323 Last data filed at 02/06/2020 0700 Gross per 24 hour  Intake --  Output 675 ml  Net -675 ml   Filed Weights   01/31/20 0417 02/01/20 0445 02/05/20 0606  Weight: 71.4 kg 69.1 kg 76.8 kg    Exam:  General exam: Appears distant Respiratory system: Clear to auscultation. Respiratory effort normal. Cardiovascular system:  S1 & S2 heard, RRR. No JVD, murmurs, rubs, gallops or clicks. No pedal edema. Gastrointestinal system: Abdomen is nondistended, soft and nontender. No organomegaly or masses felt. Normal bowel sounds heard. Central nervous system: Alert but unable to assess orientation.  Will not follow commands.  Normal tone.   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   -The specific etiology remains unclear, but the differential for this appears to be an autoimmune encephalitis, or post status epilepticus encephalopathy.  TSH normal.  Neuraxial imaging unremarkable.  CT C/A/P shows no signs of cancer, paraneoplastic encephalitis seems unlikely.  The clinical picture is not consistent with a true psychiatric mediated catatonia, nor an opiate/alcohol withdrawal syndrome at this point (see below regarding psychiatry recommendations).    -Completed IVIG 10/1. Upon review of neurology notes they have documented that it may take several weeks before full effects/potential improvement from IVIG seen.  Will need reevaluation with neurologist 6 weeks from 01/22/2020 to determine if any significant improvements or whether this will be patient's baseline mentation. -of note, autoimmune studies performed on CSF fluid and apparently are still at the [facility] lab -Continue phenobarbital, Vimpat, and Dilantin-Dilantin level 2.9 on 10/6 therefore Dilantin changed from 100 mg TID to 200 mg BID; neuro recommends gradual down titration and subsequent discontinuation of AEDs after discharge if remains seizure-free -consider repeat EEG and MRI in 6 to 8 weeks after discharge -Evaluated by psychiatry on 9/29-they added low-dose Seroquel but no improvement in symptoms therefore this was discontinued. -Xanax titrated and discontinued on 10/14 -Continue empiric thiamine and  vitamin B6 replacement -During the entire hospitalization she has had waxing and waning mentation episodes of frank catatonia and unexplained episodes of alertness and nearly appropriate or awake and severely agitated followed by long periods of lethargy and catatonia-10/5 she was very alert, followed simple commands, was mostly oriented, had fluent speech although at times was perseverating.  But since that time has essentially remained unresponsive with limited engagement with staff and limited verbal responses. -10/6  EEG negative for seizure activity -10/7 Neuro dc'd Keppra since can worsen depression symptoms -Neurology documented on 10/11 that patient has a combination of neurological and psychiatric issues contributing to her nonverbal state and therefore no acute inpatient management from a neurology standpoint indicated prompting neurology to sign off.  Evaluated by psychiatry on 10/12 and recommended discontinuing previously ordered methadone and tapering and discontinuing Xanax.  Documented that sedatives such as scheduled diazepam or Seroquel unlikely to provide clinical benefit and would cloud the ability to observe for improvement in mentation.  No indication that undergoing anesthesia for surgical procedure would be contraindicated although anticipate would temporarily cloud patient's mentation until these medications had worn off.  Suspected chronic depression in context of ongoing polysubstance abuse prior to admission -Long discussion with patient's aunt.  Patient had been struggling with her emotions and with ongoing substance abuse for years.  This had worsened over the past several months before admission.  Patient had  recently lost custody of her children because of her substance abuse and in the month prior to admission she began after increasing daily use of methamphetamine. -Given suspected underlying catatonia with psychiatric etiology neurology recommended resuming patient's  preadmission methadone and adding a benzodiazepine.  When patient's home pharmacy called they stated she was not getting methadone from them but they typically do not supply methadone.  On 10/14 family member at bedside confirmed that [place] (she does not the name of this clinic) confirmed the patient was on high-dose methadone which is consistent with the 60 mg dosage documented by pharmacist at intake. -10/15 once again patient has awakened quite alert and interactive with family and staff.  Emotional at times and agreeable to psych consultation.  While discussing her unresponsive time patient made an interesting statement replying "all those men were talking to me and I just did not want to talk back".  If remains alert and able to participate in therapies may need to reconsider CIR.  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  Surgery planned for 10/8 canceled due to abrupt change in mentation and has not yet been rescheduled. -During episode of increased awakeness on 10/5 SLP reevaluated patient but oral intake was inconsistent therefore recommended no change in current diet and to continue primary diet through feeding tube -Patient very alert today.  Discussed with patient and family that given degree of patient underwent gastrostomy tube placement.  Per general surgery, we can resume tube feedings.  Nurse informed.  Acute Urinary retention/E. coli UTI -10/5 bladder scan w/ > 700 cc so I/O cath ordered -cont q shift bladder scans -Urine culture positive  for pansensitive E. Coli -Has completed 3 days of IV Rocephin  Mild hypokalemia -Potassium 3.9 on 10/13 -Continue per tube replacement of potassium -Follow labs PRN  Ileus probably due to constipation -Has had successful evacuation of the colon with use of MiraLAX and magnesium citrate -Follow-up imaging on 10/13 revealed increase in colonic gas and other symptoms consistent with ileus.  No mention of stool burden in colon but air-fluid levels were present in the colon. -Core track tube in proximal duodenum -On clinical exam patient with tinkling bowel sounds right abdomen consistent with ileus.  Have opted to decrease tube feedings to 30 cc/h and add IV fluids at 50 cc/h -If psych and/or neurology recommend resuming preadmission methadone would need to hold until ileus resolved.  Nonobstructive transaminitis/hepatitis C -Elevated HCV antibody with markedly elevated HCV RNA quantitative level  consistent with chronic hepatitis C   Nutrition Status/moderate to severe protein calorie malnutrition: Nutrition Problem: Inadequate oral intake Etiology: lethargy/confusion Signs/Symptoms: meal completion < 25% Interventions: Tube feeding, Prostat Estimated body mass index is 28.18 kg/m as calculated from the following:   Height as of this encounter: 5\' 5"  (1.651 m).   Weight as of this encounter: 76.8 kg.  Yeast vaginitis -Completed intravaginal Monistat    Data Reviewed: Basic Metabolic Panel: Recent Labs  Lab 02/01/20 0107 02/03/20 0131  NA 135 136  K 3.5 3.9  CL 101 102  CO2 25 28  GLUCOSE 125* 148*  BUN 8  12  CREATININE 0.61 0.53  CALCIUM 8.8* 9.2  MG 2.0 2.2  PHOS 3.4 3.6   Liver Function Tests: Recent Labs  Lab 02/01/20 0107 02/03/20 0131  AST 80* 80*  ALT 123* 121*  ALKPHOS 73 86  BILITOT 0.7 0.6  PROT 7.7 7.6  ALBUMIN 3.1* 3.1*   No results for input(s): LIPASE, AMYLASE in the last 168 hours. No results for input(s): AMMONIA in the last 168  hours. CBC: Recent Labs  Lab 02/01/20 0107 02/03/20 0131  WBC 5.3 4.8  NEUTROABS 2.8 2.0  HGB 13.2 13.9  HCT 39.3 40.2  MCV 92.3 91.6  PLT 302 278   Cardiac Enzymes: No results for input(s): CKTOTAL, CKMB, CKMBINDEX, TROPONINI in the last 168 hours. BNP (last 3 results) No results for input(s): BNP in the last 8760 hours.  ProBNP (last 3 results) No results for input(s): PROBNP in the last 8760 hours.  CBG: Recent Labs  Lab 02/05/20 2025 02/05/20 2339 02/06/20 0356 02/06/20 0735 02/06/20 1123  GLUCAP 99 124* 117* 125* 123*    Recent Results (from the past 240 hour(s))  Culture, Urine     Status: Abnormal   Collection Time: 01/28/20  4:22 PM   Specimen: Urine, Random  Result Value Ref Range Status   Specimen Description URINE, RANDOM  Final   Special Requests   Final    NONE Performed at [facility], [address] [place], [place] [address]    Culture >=100,000 COLONIES/mL ESCHERICHIA COLI (A)  Final   Report Status 01/30/2020 FINAL  Final   Organism ID, Bacteria ESCHERICHIA COLI (A)  Final      Susceptibility   Escherichia coli - MIC*    AMPICILLIN <=2 SENSITIVE Sensitive     CEFAZOLIN <=4 SENSITIVE Sensitive     CEFTRIAXONE <=0.25 SENSITIVE Sensitive     CIPROFLOXACIN <=0.25 SENSITIVE Sensitive     GENTAMICIN <=1 SENSITIVE Sensitive     IMIPENEM <=0.25 SENSITIVE Sensitive     NITROFURANTOIN <=16 SENSITIVE Sensitive     TRIMETH/SULFA <=20 SENSITIVE Sensitive     AMPICILLIN/SULBACTAM <=2 SENSITIVE Sensitive     PIP/TAZO <=4 SENSITIVE Sensitive     * >=100,000 COLONIES/mL ESCHERICHIA COLI  Culture, Urine     Status: None   Collection Time: 01/29/20  5:08 PM   Specimen: Urine, Catheterized  Result Value Ref Range Status   Specimen Description URINE, CATHETERIZED  Final   Special Requests Normal  Final   Culture   Final    NO GROWTH Performed at [facility], [address] [place], [place] [address]    Report Status 01/30/2020 FINAL   Final     Studies: No results found.  Scheduled Meds: . amantadine  100 mg Per Tube BID  . Chlorhexidine Gluconate Cloth  6 each Topical Daily  . enoxaparin (LOVENOX) injection  40 mg Subcutaneous Q24H  . famotidine  20 mg Per Tube Daily  . free water  200 mL Per Tube Q6H  . influenza vac split quadrivalent PF  0.5 mL Intramuscular Tomorrow-1000  . insulin aspart  0-9 Units Subcutaneous TID WC  . lacosamide  200 mg Per Tube BID  . lactulose  20 g Per Tube QHS  . PHENObarbital  60 mg Per Tube BID  . phenytoin  200 mg Per Tube BID  . polyethylene glycol  17 g Per Tube Daily  . potassium chloride  40 mEq Per Tube Daily  . vitamin B-6  100 mg Per  Tube Daily  . senna  1 tablet Per Tube QHS  . thiamine  100 mg Per Tube Daily   Continuous Infusions: . dextrose 5 % and 0.9 % NaCl with KCl 20 mEq/L Stopped (02/05/20 1048)  . feeding supplement (OSMOLITE 1.2 CAL) 1,000 mL (02/06/20 1307)    Principal Problem:   Refractory seizure (HCC) Active Problems:   Acute metabolic encephalopathy   Hypokalemia   Polysubstance abuse (HCC)   Elevated CK   Transaminitis   Chronic hepatitis C without hepatic coma (HCC)   Distended abdomen   Palliative care encounter   Colonic Ileus [facility])   Consultants:  Neurology  Psychiatry  Interventional radiology  Surgery  Procedures:  9/14 lumbar puncture  9/15 EEG  9/16 EEG  9/17 EEG  9/19 overnight EEG with video  9/22 overnight EEG with video with discontinuation of long-term EEG monitoring on 9/25  9/24 core track placement  10/6 EEG  Antibiotics: Anti-infectives (From admission, onward)   Start     Dose/Rate Route Frequency Ordered Stop   02/05/20 0600  ceFAZolin (ANCEF) IVPB 2g/100 mL premix        2 g 200 mL/hr over 30 Minutes Intravenous To Short Stay 02/04/20 1054 02/05/20 0950   01/29/20 0600  ceFAZolin (ANCEF) IVPB 2g/100 mL premix        2 g 200 mL/hr over 30 Minutes Intravenous On call to O.R. 01/28/20 1511  01/30/20 0559   01/28/20 2000  cefTRIAXone (ROCEPHIN) 1 g in sodium chloride 0.9 % 100 mL IVPB        1 g 200 mL/hr over 30 Minutes Intravenous Every 24 hours 01/28/20 1925 02/02/20 0724   01/06/20 0900  cefTRIAXone (ROCEPHIN) 2 g in sodium chloride 0.9 % 100 mL IVPB  Status:  Discontinued        2 g 200 mL/hr over 30 Minutes Intravenous Every 12 hours 01/06/20 0105 01/06/20 2202   01/06/20 0800  vancomycin (VANCOCIN) IVPB 1000 mg/200 mL premix  Status:  Discontinued       "Followed by" Linked Group Details   1,000 mg 200 mL/hr over 60 Minutes Intravenous Every 12 hours 01/05/20 1904 01/06/20 2202   01/05/20 2000  vancomycin (VANCOREADY) IVPB 1500 mg/300 mL       "Followed by" Linked Group Details   1,500 mg 150 mL/hr over 120 Minutes Intravenous  Once 01/05/20 1904 01/06/20 0127   01/05/20 1830  cefTRIAXone (ROCEPHIN) 2 g in sodium chloride 0.9 % 100 mL IVPB        2 g 200 mL/hr over 30 Minutes Intravenous  Once 01/05/20 1829 01/05/20 2220       Time spent: 20    Hughie Closs MD [facility] Pager [phone]. If 7PM-7AM, please contact night-coverage at [email or web address] 02/06/2020, 1:23 PM  LOS: 31 days

## 2020-02-07 LAB — GLUCOSE, CAPILLARY
Glucose-Capillary: 136 mg/dL — ABNORMAL HIGH (ref 70–99)
Glucose-Capillary: 137 mg/dL — ABNORMAL HIGH (ref 70–99)
Glucose-Capillary: 145 mg/dL — ABNORMAL HIGH (ref 70–99)
Glucose-Capillary: 159 mg/dL — ABNORMAL HIGH (ref 70–99)
Glucose-Capillary: 160 mg/dL — ABNORMAL HIGH (ref 70–99)
Glucose-Capillary: 173 mg/dL — ABNORMAL HIGH (ref 70–99)

## 2020-02-07 LAB — BASIC METABOLIC PANEL
Anion gap: 9 (ref 5–15)
BUN: 16 mg/dL (ref 6–20)
CO2: 27 mmol/L (ref 22–32)
Calcium: 9.5 mg/dL (ref 8.9–10.3)
Chloride: 101 mmol/L (ref 98–111)
Creatinine, Ser: 0.64 mg/dL (ref 0.44–1.00)
GFR, Estimated: >60 mL/min (ref >60)
Glucose, Bld: 138 mg/dL — ABNORMAL HIGH (ref 70–99)
Potassium: 3.7 mmol/L (ref 3.5–5.1)
Sodium: 137 mmol/L (ref 135–145)

## 2020-02-07 NOTE — Progress Notes (Signed)
[facility] PROGRESS NOTE  Anita Davis AVW:[id number] DOB: 1986/01/27 DOA: 01/04/2020 PCP: [name], PA-C  Status: Inpatient-Remains inpatient appropriate because:Altered mental status, Ongoing diagnostic testing needed not appropriate for outpatient work up and Unsafe d/c plan -plan for PEG tube placement on 10/5   Dispo: The patient is from: Home              Anticipated d/c is to: SNF              Anticipated d/c date is: > 3 days              Patient currently is not medically stable to d/c.  Due to UNSAFE DC PLAN-patient needs placement in skilled nursing facility based on altered mentation and inability to manage self-care.  She currently requires 24/7 care due to delay from presumptive diagnosis of autoimmune encephalitis.  She is currently bedbound and awaiting PEG tube placement patient does have a dysphagia 1 diet ordered but given her variable mentation she is inconsistently and unsafely able to manage oral intake.  Over the past 24 hours she has developed improving mentation that is waxing and waning and appears to indicate that patient may be eligible for inpatient rehabilitative services as opposed to SNF placement.  Patient is self-pay financial counseling aware that patient needs work-up for Medicaid and disability.  She may also need to have a family member established as her legal guardian.  10/4 TOC spoke with patient's aunt Ms. Anita Davis and updated her on plans to have financial counseling review case and reach out to family regarding application for Medicaid, disability and guardianship is necessary.  Ms. Anita Davis says she is currently guardian for another family member with TBI and is uncertain if she could manage to guardianship but will discuss with other members of the family.  10/15 patient examined after G-tube placement.  The surgeon told me that she actually awakened in the preoperative area and has remained awake after surgical procedure.  Close family  friend who has custody of her children as well as her teenage son at bedside.  Patient interacting verbally and visually.  Crying at times.  Remains confused.  When psychiatric consultation offered she agreed.   Code Status: Full Family Communication: Updated Anita Davis "[address]" [name] 10/8-son and family friend at bedside on 10/15 DVT prophylaxis: Lovenox Vaccination status: Has not received Covid vaccine.  HPI: 34 y.o. F with hx substance use disorder, active, hepatitis C, and gestational diabetes who presented with confusion.  Patient initially admitted in September with encephalopathy, thought to be from drug overdose.  LP showed no signs of infection. MRI brain unremarkable. She remained encephalopathic, and EEG showed focal seizures.  The seizures were suppressed, and she was transferred to [facility] for continuous EEG, but after seizure control, she had poor neurologic recovery.  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  She deteriorated after steroids were stopped, so IVIG was started.  Subjective: Seen and examined.  Laying on the left side of the bed with facing on the left.  She would give the impression that she is in catatonia but she has normal tone in all extremities.  She would not talk.  Eyes open during the whole encounter.  Objective: Vitals:   02/07/20 0333 02/07/20 0736  BP: (!) 129/97 (!) 128/96  Pulse: 82 94  Resp: 18 16  Temp: 98.1 F (36.7 C) 98.2 F (36.8 C)  SpO2: 98% 100%  Intake/Output Summary (Last 24 hours) at 02/07/2020 1411 Last data filed at 02/07/2020 1013 Gross per 24 hour  Intake 0 ml  Output 1100 ml  Net -1100 ml   Filed Weights   02/01/20 0445 02/05/20 0606 02/07/20 0500  Weight: 69.1 kg 76.8 kg 74.9 kg    Exam:  General exam: Appears calm and comfortable  Respiratory system: Clear to auscultation. Respiratory effort normal. Cardiovascular system: S1 &  S2 heard, RRR. No JVD, murmurs, rubs, gallops or clicks. No pedal edema. Gastrointestinal system: Abdomen is nondistended, soft and nontender. No organomegaly or masses felt. Normal bowel sounds heard. Central nervous system: Alert but unable to assess orientation.  Unable to complete thorough neurological exam due to her not following any commands.  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   -The specific etiology remains unclear, but the differential for this appears to be an autoimmune encephalitis, or post status epilepticus encephalopathy.  TSH normal.  Neuraxial imaging unremarkable.  CT C/A/P shows no signs of cancer, paraneoplastic encephalitis seems unlikely.  The clinical picture is not consistent with a true psychiatric mediated catatonia, nor an opiate/alcohol withdrawal syndrome at this point (see below regarding psychiatry recommendations).    -Completed IVIG 10/1. Upon review of neurology notes they have documented that it may take several weeks before full effects/potential improvement from IVIG seen.  Will need reevaluation with neurologist 6 weeks from 01/22/2020 to determine if any significant improvements or whether this will be patient's baseline mentation. -of note, autoimmune studies performed on CSF fluid and apparently are still at the [facility] lab -Continue phenobarbital, Vimpat, and Dilantin-Dilantin level 2.9 on 10/6 therefore Dilantin changed from 100 mg TID to 200 mg BID; neuro recommends gradual down titration and subsequent discontinuation of AEDs after discharge if remains seizure-free -consider repeat EEG and MRI in 6 to 8 weeks after discharge -Evaluated by psychiatry on 9/29-they added low-dose Seroquel but no improvement in symptoms therefore this was discontinued. -Xanax titrated and discontinued on  10/14 -Continue empiric thiamine and vitamin B6 replacement -During the entire hospitalization she has had waxing and waning mentation episodes of frank catatonia and unexplained episodes of alertness and nearly appropriate or awake and severely agitated followed by long periods of lethargy and catatonia-10/5 she was very alert, followed simple commands, was mostly oriented, had fluent speech although at times was perseverating.  But since that time has essentially remained unresponsive with limited engagement with staff and limited verbal responses. -10/6  EEG negative for seizure activity -10/7 Neuro dc'd Keppra since can worsen depression symptoms -Neurology documented on 10/11 that patient has a combination of neurological and psychiatric issues contributing to her nonverbal state and therefore no acute inpatient management from a neurology standpoint indicated prompting neurology to sign off.  Evaluated by psychiatry on 10/12 and recommended discontinuing previously ordered methadone and tapering and discontinuing Xanax.  Documented that sedatives such as scheduled diazepam or Seroquel unlikely to provide clinical benefit and would cloud the ability to observe for improvement in mentation.  No indication that undergoing anesthesia for surgical procedure would be contraindicated although anticipate would temporarily cloud patient's mentation until these medications had worn off.  I doubt encephalitis.  She likely has some sort of psychiatric disorder.  No catatonia.  Part of me thinks that she likely has conversion syndrome or severe depression as she had talked like a normal person when her son was  here other day.  Suspected chronic depression in context of ongoing polysubstance abuse prior to admission -Long discussion with patient's aunt.  Patient had been struggling with her emotions and with ongoing substance abuse for years.  This had worsened over the past several months before admission.   Patient had recently lost custody of her children because of her substance abuse and in the month prior to admission she began after increasing daily use of methamphetamine. -Given suspected underlying catatonia with psychiatric etiology neurology recommended resuming patient's preadmission methadone and adding a benzodiazepine.  When patient's home pharmacy called they stated she was not getting methadone from them but they typically do not supply methadone.  On 10/14 family member at bedside confirmed that patient was going to a methadone clinic in [facility] (she does not the name of this clinic) confirmed the patient was on high-dose methadone which is consistent with the 60 mg dosage documented by pharmacist at intake.  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  Surgery planned for 10/8 canceled due to abrupt change in mentation and has not yet been rescheduled. -During episode of increased awakeness on 10/5 SLP reevaluated patient but oral intake was inconsistent therefore recommended no change in current diet and to continue primary diet through feeding tube -Patient very alert today.  Discussed with patient and family that given degree of patient underwent gastrostomy tube placement.  Per general surgery, we can resume tube feedings.  Nurse informed.  Acute Urinary retention/E. coli UTI -10/5 bladder scan w/ > 700 cc so I/O cath ordered -cont q shift bladder scans -Urine culture positive for pansensitive E. Coli -Has completed 3 days of IV Rocephin  Mild hypokalemia Resolved.  Ileus  probably due to constipation -Has had successful evacuation of the colon with use of MiraLAX and magnesium citrate -Follow-up imaging on 10/13 revealed increase in colonic gas and other symptoms consistent with ileus.  No mention of stool burden in colon but air-fluid levels were present in the colon. -Core track tube in proximal duodenum -On clinical exam patient with tinkling bowel sounds right abdomen consistent with ileus.  Have opted to decrease tube feedings to 30 cc/h and add IV fluids at 50 cc/h -If psych and/or neurology recommend resuming preadmission methadone would need to hold until ileus resolved.  Nonobstructive transaminitis/hepatitis C -Elevated HCV antibody with markedly elevated HCV RNA quantitative level  consistent with chronic hepatitis C   Nutrition Status/moderate to severe protein calorie malnutrition: Nutrition Problem: Inadequate oral intake Etiology: lethargy/confusion Signs/Symptoms: meal completion < 25% Interventions: Tube feeding, Prostat Estimated body mass index is 27.48 kg/m as calculated from the following:   Height as of this encounter: 5\' 5"  (1.651 m).   Weight as of this encounter: 74.9 kg.  Yeast vaginitis -Completed intravaginal Monistat    Data Reviewed: Basic Metabolic Panel: Recent Labs  Lab 02/01/20 0107 02/03/20 0131 02/07/20 0139  NA 135 136 137  K 3.5 3.9 3.7  CL 101 102 101  CO2 25 28 27   GLUCOSE 125* 148* 138*  BUN 8 12 16   CREATININE 0.61 0.53 0.64  CALCIUM 8.8* 9.2 9.5  MG 2.0 2.2  --   PHOS  3.4 3.6  --    Liver Function Tests: Recent Labs  Lab 02/01/20 0107 02/03/20 0131  AST 80* 80*  ALT 123* 121*  ALKPHOS 73 86  BILITOT 0.7 0.6  PROT 7.7 7.6  ALBUMIN 3.1* 3.1*   No results for input(s): LIPASE, AMYLASE in the last 168 hours. No results for input(s): AMMONIA in the last 168 hours. CBC: Recent Labs  Lab 02/01/20 0107 02/03/20 0131  WBC 5.3 4.8  NEUTROABS 2.8 2.0  HGB 13.2 13.9  HCT 39.3 40.2  MCV  92.3 91.6  PLT 302 278   Cardiac Enzymes: No results for input(s): CKTOTAL, CKMB, CKMBINDEX, TROPONINI in the last 168 hours. BNP (last 3 results) No results for input(s): BNP in the last 8760 hours.  ProBNP (last 3 results) No results for input(s): PROBNP in the last 8760 hours.  CBG: Recent Labs  Lab 02/06/20 1952 02/07/20 0024 02/07/20 0416 02/07/20 0735 02/07/20 1216  GLUCAP 115* 145* 160* 173* 159*    Recent Results (from the past 240 hour(s))  Culture, Urine     Status: Abnormal   Collection Time: 01/28/20  4:22 PM   Specimen: Urine, Random  Result Value Ref Range Status   Specimen Description URINE, RANDOM  Final   Special Requests   Final    NONE Performed at [facility], [address] [place], [place] [address]    Culture >=100,000 COLONIES/mL ESCHERICHIA COLI (A)  Final   Report Status 01/30/2020 FINAL  Final   Organism ID, Bacteria ESCHERICHIA COLI (A)  Final      Susceptibility   Escherichia coli - MIC*    AMPICILLIN <=2 SENSITIVE Sensitive     CEFAZOLIN <=4 SENSITIVE Sensitive     CEFTRIAXONE <=0.25 SENSITIVE Sensitive     CIPROFLOXACIN <=0.25 SENSITIVE Sensitive     GENTAMICIN <=1 SENSITIVE Sensitive     IMIPENEM <=0.25 SENSITIVE Sensitive     NITROFURANTOIN <=16 SENSITIVE Sensitive     TRIMETH/SULFA <=20 SENSITIVE Sensitive     AMPICILLIN/SULBACTAM <=2 SENSITIVE Sensitive     PIP/TAZO <=4 SENSITIVE Sensitive     * >=100,000 COLONIES/mL ESCHERICHIA COLI  Culture, Urine     Status: None   Collection Time: 01/29/20  5:08 PM   Specimen: Urine, Catheterized  Result Value Ref Range Status   Specimen Description URINE, CATHETERIZED  Final   Special Requests Normal  Final   Culture   Final    NO GROWTH Performed at [facility], [address] [place], [place] [address]    Report Status 01/30/2020 FINAL  Final     Studies: No results found.  Scheduled Meds: . amantadine  100 mg Per Tube BID  . Chlorhexidine Gluconate Cloth  6  each Topical Daily  . enoxaparin (LOVENOX) injection  40 mg Subcutaneous Q24H  . famotidine  20 mg Per Tube Daily  . free water  200 mL Per Tube Q6H  . influenza vac split quadrivalent PF  0.5 mL Intramuscular Tomorrow-1000  . insulin aspart  0-9 Units Subcutaneous TID WC  . lacosamide  200 mg Per Tube BID  . lactulose  20 g Per Tube QHS  . PHENObarbital  60 mg Per Tube BID  . phenytoin  200 mg Per Tube BID  . polyethylene glycol  17 g Per Tube Daily  . potassium chloride  40 mEq Per Tube Daily  . vitamin B-6  100 mg Per Tube Daily  . senna  1 tablet Per Tube QHS  .  thiamine  100 mg Per Tube Daily   Continuous Infusions: . dextrose 5 % and 0.9 % NaCl with KCl 20 mEq/L Stopped (02/05/20 1048)  . feeding supplement (OSMOLITE 1.2 CAL) 1,000 mL (02/06/20 1831)    Principal Problem:   Refractory seizure (HCC) Active Problems:   Acute metabolic encephalopathy   Hypokalemia   Polysubstance abuse (HCC)   Elevated CK   Transaminitis   Chronic hepatitis C without hepatic coma (HCC)   Distended abdomen   Palliative care encounter   Colonic Ileus [facility])   Consultants:  Neurology  Psychiatry  Interventional radiology  Surgery  Procedures:  9/14 lumbar puncture  9/15 EEG  9/16 EEG  9/17 EEG  9/19 overnight EEG with video  9/22 overnight EEG with video with discontinuation of long-term EEG monitoring on 9/25  9/24 core track placement  10/6 EEG  Antibiotics: Anti-infectives (From admission, onward)   Start     Dose/Rate Route Frequency Ordered Stop   02/05/20 0600  ceFAZolin (ANCEF) IVPB 2g/100 mL premix        2 g 200 mL/hr over 30 Minutes Intravenous To Short Stay 02/04/20 1054 02/05/20 0950   01/29/20 0600  ceFAZolin (ANCEF) IVPB 2g/100 mL premix        2 g 200 mL/hr over 30 Minutes Intravenous On call to O.R. 01/28/20 1511 01/30/20 0559   01/28/20 2000  cefTRIAXone (ROCEPHIN) 1 g in sodium chloride 0.9 % 100 mL IVPB        1 g 200 mL/hr over 30 Minutes  Intravenous Every 24 hours 01/28/20 1925 02/02/20 0724   01/06/20 0900  cefTRIAXone (ROCEPHIN) 2 g in sodium chloride 0.9 % 100 mL IVPB  Status:  Discontinued        2 g 200 mL/hr over 30 Minutes Intravenous Every 12 hours 01/06/20 0105 01/06/20 2202   01/06/20 0800  vancomycin (VANCOCIN) IVPB 1000 mg/200 mL premix  Status:  Discontinued       "Followed by" Linked Group Details   1,000 mg 200 mL/hr over 60 Minutes Intravenous Every 12 hours 01/05/20 1904 01/06/20 2202   01/05/20 2000  vancomycin (VANCOREADY) IVPB 1500 mg/300 mL       "Followed by" Linked Group Details   1,500 mg 150 mL/hr over 120 Minutes Intravenous  Once 01/05/20 1904 01/06/20 0127   01/05/20 1830  cefTRIAXone (ROCEPHIN) 2 g in sodium chloride 0.9 % 100 mL IVPB        2 g 200 mL/hr over 30 Minutes Intravenous  Once 01/05/20 1829 01/05/20 2220       Time spent: 21 min    Hughie Closs MD [facility] Pager [phone]. If 7PM-7AM, please contact night-coverage at [email or web address] 02/07/2020, 2:11 PM  LOS: 32 days

## 2020-02-07 NOTE — Consult Note (Signed)
Patient continues to be nonverbal and unable to assess mentally.  Please re-consult when able to participate in an assessment.  RN reports she will speak a few words when her son visits.    Nanine Means, PMHNP

## 2020-02-08 LAB — GLUCOSE, CAPILLARY
Glucose-Capillary: 138 mg/dL — ABNORMAL HIGH (ref 70–99)
Glucose-Capillary: 145 mg/dL — ABNORMAL HIGH (ref 70–99)
Glucose-Capillary: 148 mg/dL — ABNORMAL HIGH (ref 70–99)
Glucose-Capillary: 150 mg/dL — ABNORMAL HIGH (ref 70–99)
Glucose-Capillary: 169 mg/dL — ABNORMAL HIGH (ref 70–99)
Glucose-Capillary: 96 mg/dL (ref 70–99)

## 2020-02-08 LAB — MRSA PCR SCREENING: MRSA by PCR: POSITIVE — AB

## 2020-02-08 MED ORDER — THIAMINE HCL 100 MG/ML IJ SOLN
100.0000 mg | INTRAMUSCULAR | Status: DC
  Administered 2020-02-09 – 2020-02-11 (×3): 100 mg via INTRAVENOUS
  Filled 2020-02-08 (×3): qty 2

## 2020-02-08 MED ORDER — SODIUM CHLORIDE 0.9 % IV SOLN
200.0000 mg | Freq: Two times a day (BID) | INTRAVENOUS | Status: DC
  Administered 2020-02-08 – 2020-02-11 (×7): 200 mg via INTRAVENOUS
  Filled 2020-02-08 (×11): qty 20

## 2020-02-08 MED ORDER — PYRIDOXINE HCL 100 MG/ML IJ SOLN
100.0000 mg | Freq: Every day | INTRAMUSCULAR | Status: DC
  Administered 2020-02-09 – 2020-02-11 (×3): 100 mg via INTRAVENOUS
  Filled 2020-02-08 (×4): qty 1

## 2020-02-08 MED ORDER — PHENOBARBITAL SODIUM 65 MG/ML IJ SOLN
65.0000 mg | Freq: Two times a day (BID) | INTRAMUSCULAR | Status: DC
  Administered 2020-02-08 – 2020-02-11 (×7): 65 mg via INTRAVENOUS
  Filled 2020-02-08 (×7): qty 1

## 2020-02-08 MED ORDER — FAMOTIDINE IN NACL 20-0.9 MG/50ML-% IV SOLN
20.0000 mg | INTRAVENOUS | Status: DC
  Administered 2020-02-09 – 2020-02-16 (×8): 20 mg via INTRAVENOUS
  Filled 2020-02-08 (×9): qty 50

## 2020-02-08 MED ORDER — SODIUM CHLORIDE 0.9 % IV SOLN
125.0000 mg | Freq: Three times a day (TID) | INTRAVENOUS | Status: DC
  Administered 2020-02-08 – 2020-02-12 (×11): 125 mg via INTRAVENOUS
  Filled 2020-02-08 (×17): qty 2.5

## 2020-02-08 NOTE — Progress Notes (Signed)
Physical Therapy Treatment [name] MRN: [id number] DOB: 1985/04/25 Today's Date: 02/08/2020    History of Present Illness 34 year old with past medical history significant for hepatitis C, polysubstance abuse brought to the hospital 9/13 for disorientation which was suspected to be substance abuse.  After she became more lucid, she was discharged, but then police brought her back later in the day and they found her passed out in a parking lot.  Urine drug screen was positive for benzodiazepine and THC. An EEG done on 9/15 showed patient was in status epilepticus.  Patient was a started on Keppra.  Despite these, EEG noted continued seizures requiring additional medications as well.  Finally on 918, seizures broke. Follow-up EEG on 9/20 noted evidence of [name] from the left central temporal region.  Repeat EEG done on 9/22 noted epileptogenic city from the left central temporal region.  Pt with PEG placed on 02/05/20.    PT Comments    Pt with increased ability to participate today.  She performed AAROM exercises with initiation and transfers with multimodal cues.  Pt was able to take a few steps with HHA of 2.  She is able to follow commands with increased time but still no verbal or gesture communication or eye contact.   Pt still with inconsistent participation , but is trending to increased participation.  Left recommendation for SNF but did update to increased frequency due to increased participation.    Follow Up Recommendations  SNF;Supervision/Assistance - 24 hour     Equipment Recommendations  Wheelchair (measurements PT);Wheelchair cushion (measurements PT);Hospital bed;Other (comment);3in1 (PT)    Recommendations for Other Services       Precautions / Restrictions Precautions Precautions: Fall Precaution Comments: Seizure, PEG, foley catheter    Mobility  Bed Mobility Overal bed mobility: Needs Assistance Bed Mobility: Supine to Sit      Supine to sit: Mod assist     General bed mobility comments: Pt assisted with legs to EOB but then pt only requiring min A for trunk  Transfers Overall transfer level: Needs assistance Equipment used: 2 person hand held assist Transfers: Sit to/from Stand Sit to Stand: Min assist;+2 safety/equipment         General transfer comment: Sit to stand x 2 from EOB wtih min A X 2 for safety.  Required multi-modal cues to perform.  With return to sitting pt tending just to fall back - provided tactile input to flex hips and control descent  Ambulation/Gait Ambulation/Gait assistance: Min assist;+2 physical assistance Gait Distance (Feet): 3 Feet Assistive device: 2 person hand held assist Gait Pattern/deviations: Decreased stride length;Narrow base of support;Step-to pattern Gait velocity: decreased   General Gait Details: Unsteady requiring HHA of 2 for safety and assist to weight shift. Only took steps to chair.   Stairs             Wheelchair Mobility    Modified Rankin (Stroke Patients Only)       Balance Overall balance assessment: Needs assistance Sitting-balance support: No upper extremity supported Sitting balance-Leahy Scale: Fair Sitting balance - Comments: Able to sit EOB without UE support but not accept any challenges.   Standing balance support: Bilateral upper extremity supported;During functional activity Standing balance-Leahy Scale: Poor Standing balance comment: Min A of 2 with HHA for standing and taking a few steps  Cognition Arousal/Alertness: Awake/alert Behavior During Therapy: Flat affect Overall Cognitive Status: Impaired/Different from baseline                   Orientation Level: Disoriented to;Person;Place;Time;Situation (difficult to assess- no verbal/gesture reponse) Current Attention Level: Sustained   Following Commands: Follows one step commands inconsistently (required gestures,  initiation, repetition)     Problem Solving: Slow processing;Decreased initiation;Requires verbal cues;Requires tactile cues General Comments: Pt does not respond verbally or gesturally to any questions. She was able to perform ROM exercises with initation and functional transfers with verbal/tacitle cues      Exercises General Exercises - Upper Extremity Shoulder Flexion: AAROM;Both;10 reps;Supine Elbow Flexion: AAROM;Both;10 reps;Supine Elbow Extension: AAROM;Both;10 reps;Supine Wrist Flexion: AAROM;Both;5 reps;Supine Wrist Extension: AAROM;Both;5 reps;Supine General Exercises - Lower Extremity Ankle Circles/Pumps: AAROM;Both;10 reps;Supine Long Arc Quad: AAROM;Both;5 reps;Seated Heel Slides: AAROM;Both;10 reps;Supine Hip ABduction/ADduction: AAROM;Both;10 reps;Supine    General Comments General comments (skin integrity, edema, etc.): VSS, positioned in chair with pillows, confirmed with RN prior to leaving pt in chair      Pertinent Vitals/Pain Pain Assessment: No/denies pain    Home Living                      Prior Function            PT Goals (current goals can now be found in the care plan section) Acute Rehab PT Goals Patient Stated Goal: unable to state PT Goal Formulation: Patient unable to participate in goal setting Potential to Achieve Goals: Fair Progress towards PT goals: Progressing toward goals    Frequency    Min 2X/week      PT Plan Frequency needs to be updated    Co-evaluation              AM-PAC PT "6 Clicks" Mobility   Outcome Measure  Help needed turning from your back to your side while in a flat bed without using bedrails?: A Lot Help needed moving from lying on your back to sitting on the side of a flat bed without using bedrails?: A Lot Help needed moving to and from a bed to a chair (including a wheelchair)?: A Lot Help needed standing up from a chair using your arms (e.g., wheelchair or bedside chair)?: A Lot Help  needed to walk in hospital room?: A Lot Help needed climbing 3-5 steps with a railing? : A Lot 6 Click Score: 12    End of Session Equipment Utilized During Treatment: Gait belt Activity Tolerance: Patient tolerated treatment well Patient left: with chair alarm set;in chair;with call bell/phone within reach Nurse Communication: Mobility status;Other (comment) (in chair with alarm; RN in and observed last transfer) PT Visit Diagnosis: Unsteadiness on feet (R26.81);Muscle weakness (generalized) (M62.81);Difficulty in walking, not elsewhere classified (R26.2);Other symptoms and signs involving the nervous system (R29.898)     Time: 3825-0539 PT Time Calculation (min) (ACUTE ONLY): 35 min  Charges:  $Therapeutic Exercise: 8-22 mins $Therapeutic Activity: 8-22 mins                     Anise Salvo, PT Acute Rehab Services Pager [phone] [facility] [phone]     Rayetta Humphrey 02/08/2020, 1:39 PM

## 2020-02-08 NOTE — Progress Notes (Signed)
Patient ID: Anita Davis, female   DOB: 1986/04/12, 34 y.o.   MRN: [id number] ctsp for g tube possibly being pulled out.  On arrival she is noncommunicative.  Her abdomen is not tender.  The flange around the tube has been pulled but I dont think the actual tube has moved.  I tightened this back down. I think the tube is still in good position but due to concern and fact she did pull it I think would be reasonable to hold any feeds/meds via g tube tonight and do contrast study in am to ensure in correct position before using again.  Discussed with nurse and Dr Loney Loh.

## 2020-02-08 NOTE — Progress Notes (Addendum)
[facility] PROGRESS NOTE  Anita Davis LPF:[id number] DOB: December 18, 1985 DOA: 01/04/2020 PCP: [name], PA-C  Status: Inpatient-Remains inpatient appropriate because:Altered mental status, Ongoing diagnostic testing needed not appropriate for outpatient work up and Unsafe d/c plan -plan for PEG tube placement on 10/5   Dispo: The patient is from: Home              Anticipated d/c is to: SNF              Anticipated d/c date is: > 3 days              Patient currently is not medically stable to d/c.  Due to UNSAFE DC PLAN-patient needs placement in skilled nursing facility based on altered mentation and inability to manage self-care.  She currently requires 24/7 care due to delay from presumptive diagnosis of autoimmune encephalitis.  She is currently bedbound and awaiting PEG tube placement patient does have a dysphagia 1 diet ordered but given her variable mentation she is inconsistently and unsafely able to manage oral intake.  Over the past 24 hours she has developed improving mentation that is waxing and waning and appears to indicate that patient may be eligible for inpatient rehabilitative services as opposed to SNF placement.  Patient is self-pay financial counseling aware that patient needs work-up for Medicaid and disability.  She may also need to have a family member established as her legal guardian.  10/4 TOC spoke with patient's aunt [name] and updated her on plans to have financial counseling review case and reach out to family regarding application for Medicaid, disability and guardianship is necessary.  [name] says she is currently guardian for another family member with TBI and is uncertain if she could manage to guardianship but will discuss with other members of the family.  10/15 patient examined after G-tube placement.  The surgeon told me that she actually awakened in the preoperative area and has remained awake after surgical procedure.  Close family  friend who has custody of her children as well as her teenage son at bedside.  Patient interacting verbally and visually.  Crying at times.  Remains confused.  When psychiatric consultation offered she agreed.  10/18 unfortunately Saturday and Sunday return back to initially nonverbal state.  Because of this psych unable to obtain effective evaluation and recommended we call back after patient more awake and can communicate.  Early this morning when I evaluated the patient she was slow to respond but made eye contact with me and had simple one-word responses to questions asked.  Remained confused.  Did not remember her son visiting her on Friday.  Was able to follow simple commands and seemed to be left hand dominant.  Was able to lift her left leg briefly on command.  Subsequently after the neurology team came by to reevaluate the patient she was back to her nonverbal state.   Code Status: Full Family Communication: Updated [name] "[address]" [name] 10/8-son and family friend at bedside on 10/15 DVT prophylaxis: Lovenox Vaccination status: Has not received Covid vaccine.  HPI: 34 y.o. F with hx substance use disorder, active, hepatitis C, and gestational diabetes who presented with confusion.  Patient initially admitted in September with encephalopathy, thought to be from drug overdose.  LP showed no signs of infection. MRI brain unremarkable. She remained encephalopathic, and EEG showed focal seizures.  The seizures were suppressed, and she was transferred to [facility] for continuous EEG, but after seizure control, she had poor neurologic  recovery.  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  She deteriorated after steroids were stopped, so IVIG was started.  Subjective: Patient was awake and somewhat interactive today as outlined above but once again has returned back to a more lethargic unresponsive state at the time  neurology came by to evaluate her.  Objective: Vitals:   02/08/20 1104 02/08/20 1106  BP: (!) 136/92 (!) 136/92  Pulse: 93 93  Resp: 18 18  Temp: 98.7 F (37.1 C) 98.7 F (37.1 C)  SpO2: 99% 98%    Intake/Output Summary (Last 24 hours) at 02/08/2020 1257 Last data filed at 02/08/2020 6967 Gross per 24 hour  Intake --  Output 700 ml  Net -700 ml   Filed Weights   02/01/20 0445 02/05/20 0606 02/07/20 0500  Weight: 69.1 kg 76.8 kg 74.9 kg    Exam:  Constitutional: NAD, awake very slow to respond Respiratory: clear to auscultation bilaterally, Normal respiratory effort. No accessory muscle use.  Room air Cardiovascular: Regular rate and rhythm, no murmurs / rubs / gallops. No extremity edema. 2+ pedal pulses.   Abdomen: no tenderness patient, no masses palpated.  Does appear to be slightly distended or bloated.  Bowel sounds positive but tingling primarily in the right mid quadrant.  G-tube in place Musculoskeletal: no clubbing / cyanosis. No joint deformity upper and lower extremities.no contractures.  No tremors or spasticity  Skin: no rashes, lesions, ulcers. No induration Neurologic: CN 2-12 grossly intact, weakly moves all extremities spontaneously and did follow commands with left upper extremity and right lower extremity.  Sensation intact.  No focal neurological deficits appreciated. 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   -The specific etiology remains unclear, but the differential for this appears to be an autoimmune encephalitis, or post status epilepticus encephalopathy.  TSH normal.  Neuraxial imaging unremarkable.  CT C/A/P shows no signs of cancer, paraneoplastic encephalitis seems unlikely.  The clinical picture is not consistent with a true  psychiatric mediated catatonia, nor an opiate/alcohol withdrawal syndrome at this point (see below regarding psychiatry recommendations).    -Completed IVIG 10/1. Upon review of neurology notes they have documented that it may take several weeks before full effects/potential improvement from IVIG seen.  Will need reevaluation with neurologist 6 weeks from 01/22/2020 to determine if any significant improvements or whether this will be patient's baseline mentation. -of note, autoimmune studies performed on CSF fluid and apparently are still at the [facility] lab -Continue phenobarbital, Vimpat, and Dilantin-Dilantin level 2.9 on 10/6 therefore Dilantin changed from 100 mg TID to 200 mg BID; neuro recommends gradual down titration and subsequent discontinuation of AEDs after discharge if remains seizure-free -consider repeat EEG and MRI in 6 to 8 weeks after discharge -Evaluated by psychiatry on 9/29-they added low-dose Seroquel but no improvement in symptoms therefore this was discontinued. -Xanax titrated and discontinued on 10/14 -Continue empiric thiamine and vitamin B6 replacement -Continues to have waxing and waning mentation with most of the time mentation being very flat with eyes open but otherwise not really responding and occasionally will have brief episodes lasting several hours of being quite alert and interactive but extremely confused. -Had multiple evaluations by neurology and psych.  Neurology suspects a combination of neurological issues as well as psychiatric.  Both agree that  current symptoms not consistent with catatonia.  Biggest concern is that patient has very severe depression that is contributing to symptoms -For completeness of evaluation neurology will be contacting [facility] neurology to make sure all avenues of evaluation have been exhausted.  Allergy will also speak directly with psychiatry as of 10/18  PT NOTES FROM 10/18: Pt with increased ability to participate today.  She  performed AAROM exercises with initiation and transfers with multimodal cues.  Pt was able to take a few steps with HHA of 2.  She is able to follow commands with increased time but still no verbal or gesture communication or eye contact.   Pt still with inconsistent participation , but is trending to increased participation.  Left recommendation for SNF but did update to increased frequency due to increased participation.   Suspected chronic depression in context of ongoing polysubstance abuse prior to admission -Long discussion with patient's aunt.  Patient had been struggling with her emotions and with ongoing substance abuse for years.  This had worsened over the past several months before admission.  Patient had recently lost custody of her children because of her substance abuse and in the month prior to admission she began after increasing daily use of methamphetamine. -Given suspected underlying catatonia with psychiatric etiology, neurology recommended resuming patient's preadmission methadone and adding a benzodiazepine.  When patient's home pharmacy called they stated she was not getting methadone from them but they typically do not supply methadone.  On 10/14 family member at bedside confirmed that patient was going to a methadone clinic in [facility] (she does not the name of this clinic) confirmed the patient was on high-dose methadone which is consistent with the 60 mg dosage documented by pharmacist at intake.  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 coli UTI -10/5 bladder scan w/ > 700 cc so I/O cath ordered -cont q shift bladder scans -Urine culture positive for pansensitive E. Coli -Has completed 3 days of IV Rocephin  Mild hypokalemia -Potassium 3.9 on 10/13 -Continue per tube replacement of potassium -Follow labs PRN  Ileus probably due to constipation -Symptoms have resolved and patient tolerating full rate tube feeding at this juncture and having regular bowel movements  Nonobstructive transaminitis/hepatitis C -Elevated HCV antibody with markedly elevated HCV RNA quantitative level  consistent with chronic hepatitis C   Nutrition Status/moderate to severe protein calorie malnutrition: Nutrition Problem: Inadequate oral intake Etiology: lethargy/confusion Signs/Symptoms: meal completion < 25% Interventions: Tube feeding, Prostat Estimated body mass index is 27.48 kg/m as calculated from the following:   Height as of this encounter:  (1.651 m).   Weight as of this encounter: 74.9 kg.  Yeast vaginitis -Completed intravaginal Monistat    Data Reviewed: Basic Metabolic Panel: Recent Labs  Lab 02/03/20 0131 02/07/20 0139  NA 136 137  K 3.9 3.7  CL 102 101  CO2 28 27  GLUCOSE 148* 138*  BUN 12 16  CREATININE 0.53 0.64  CALCIUM 9.2 9.5  MG 2.2  --   PHOS 3.6  --    Liver Function Tests: Recent Labs  Lab 02/03/20 0131  AST 80*  ALT 121*  ALKPHOS 86  BILITOT 0.6  PROT 7.6  ALBUMIN 3.1*   No results for input(s): LIPASE, AMYLASE in the last 168 hours. No results for input(s): AMMONIA in the last 168 hours. CBC: Recent Labs  Lab 02/03/20 0131  WBC 4.8  NEUTROABS 2.0  HGB 13.9  HCT 40.2  MCV 91.6  PLT 278   Cardiac Enzymes: No results for input(s): CKTOTAL, CKMB, CKMBINDEX, TROPONINI in the last 168 hours. BNP (last 3 results) No results for input(s): BNP in the last 8760 hours.  ProBNP (last 3 results) No results for input(s): PROBNP in the  last 8760 hours.  CBG: Recent Labs  Lab 02/07/20 2027 02/08/20 0024 02/08/20 0358 02/08/20 0755 02/08/20 1105  GLUCAP 137* 169* 145* 150* 148*    Recent Results (from the past 240 hour(s))  Culture, Urine     Status: None   Collection Time: 01/29/20  5:08 PM   Specimen: Urine, Catheterized  Result Value Ref Range Status   Specimen Description URINE, CATHETERIZED  Final   Special Requests Normal  Final   Culture   Final    NO GROWTH Performed at [facility], [address] [place], [place] [address]    Report Status 01/30/2020 FINAL  Final  MRSA PCR Screening     Status: Abnormal   Collection Time: 02/08/20  8:28 AM   Specimen: Nasal Mucosa; Nasopharyngeal  Result Value Ref Range Status   MRSA by PCR POSITIVE (A) NEGATIVE Final    Comment:        The GeneXpert MRSA Assay (FDA approved for NASAL specimens only), is one component of a comprehensive MRSA colonization surveillance program. It is not intended to diagnose MRSA infection nor to guide or monitor treatment for MRSA infections. RESULT CALLED TO, READ BACK BY AND VERIFIED WITH: Dimas AguasZ. Lievano RN 10:35 02/08/20 ([email or web address]) Performed at [facility], [address] [place], [place] [address]      Studies: No results found.  Scheduled Meds: . amantadine  100 mg Per Tube BID  . Chlorhexidine Gluconate Cloth  6 each Topical Daily  . enoxaparin (LOVENOX) injection  40 mg Subcutaneous Q24H  . famotidine  20 mg Per Tube Daily  . free water  200 mL Per Tube Q6H  . influenza vac split quadrivalent PF  0.5 mL Intramuscular Tomorrow-1000  . insulin aspart  0-9 Units Subcutaneous TID WC  . lacosamide  200 mg Per Tube BID  . lactulose  20 g Per Tube QHS  . PHENObarbital  60 mg Per Tube BID  . phenytoin  200 mg Per Tube BID  . polyethylene glycol  17 g Per Tube Daily  . potassium chloride  40 mEq Per Tube Daily  . vitamin B-6  100 mg Per Tube Daily  . senna  1 tablet Per Tube QHS  . thiamine  100 mg Per  Tube Daily   Continuous Infusions: . dextrose 5 % and 0.9 % NaCl with KCl 20 mEq/L Stopped (02/05/20 1048)  . feeding supplement (OSMOLITE 1.2 CAL) 1,000 mL (02/07/20 1534)    Principal Problem:   Refractory seizure (HCC) Active Problems:   Acute metabolic encephalopathy   Hypokalemia   Polysubstance abuse (HCC)   Elevated CK   Transaminitis   Chronic hepatitis C without hepatic coma (HCC)   Distended abdomen   Palliative care encounter   Colonic Ileus [facility](HCC)   Consultants:  Neurology  Psychiatry  Interventional  radiology  Surgery  Procedures:  9/14 lumbar puncture  9/15 EEG  9/16 EEG  9/17 EEG  9/19 overnight EEG with video  9/22 overnight EEG with video with discontinuation of long-term EEG monitoring on 9/25  9/24 core track placement  10/6 EEG  Antibiotics: Anti-infectives (From admission, onward)   Start     Dose/Rate Route Frequency Ordered Stop   02/05/20 0600  ceFAZolin (ANCEF) IVPB 2g/100 mL premix        2 g 200 mL/hr over 30 Minutes Intravenous To Short Stay 02/04/20 1054 02/05/20 0950   01/29/20 0600  ceFAZolin (ANCEF) IVPB 2g/100 mL premix        2 g 200 mL/hr over 30 Minutes Intravenous On call to O.R. 01/28/20 1511 01/30/20 0559   01/28/20 2000  cefTRIAXone (ROCEPHIN) 1 g in sodium chloride 0.9 % 100 mL IVPB        1 g 200 mL/hr over 30 Minutes Intravenous Every 24 hours 01/28/20 1925 02/02/20 0724   01/06/20 0900  cefTRIAXone (ROCEPHIN) 2 g in sodium chloride 0.9 % 100 mL IVPB  Status:  Discontinued        2 g 200 mL/hr over 30 Minutes Intravenous Every 12 hours 01/06/20 0105 01/06/20 2202   01/06/20 0800  vancomycin (VANCOCIN) IVPB 1000 mg/200 mL premix  Status:  Discontinued       "Followed by" Linked Group Details   1,000 mg 200 mL/hr over 60 Minutes Intravenous Every 12 hours 01/05/20 1904 01/06/20 2202   01/05/20 2000  vancomycin (VANCOREADY) IVPB 1500 mg/300 mL       "Followed by" Linked Group Details   1,500 mg 150 mL/hr  over 120 Minutes Intravenous  Once 01/05/20 1904 01/06/20 0127   01/05/20 1830  cefTRIAXone (ROCEPHIN) 2 g in sodium chloride 0.9 % 100 mL IVPB        2 g 200 mL/hr over 30 Minutes Intravenous  Once 01/05/20 1829 01/05/20 2220       Time spent: 35    [name] ANP  [facility] Pager [phone]. If 7PM-7AM, please contact night-coverage at [email or web address] 02/08/2020, 12:57 PM  LOS: 33 days

## 2020-02-08 NOTE — Progress Notes (Signed)
Received a call from crossover hospitalist ok to hold tube feedings. She will get in contact with surgery team that placed PEG tube.

## 2020-02-08 NOTE — Progress Notes (Signed)
Palliative Medicine RN Note: Discussed pt in team rounds. No further role for PMT, as GOC are clear, and patient will need SNF placement.  At this time, our team will sign off. If new palliative needs arise, or if her family asks to meet again to discuss GOC, please re-consult [place].  Margret Chance Gevin Perea, RN, BSN, [facility] Palliative Medicine Team 02/08/2020 10:41 AM Office [phone]

## 2020-02-08 NOTE — Progress Notes (Signed)
Received a call from the nurse aide to come assess patient's PEG tube. Patient was found by NT with abd binder open and PEG tube pulled out about an inch from where it was originally sutured. 1 of 2 sutures still intact. Tube feed held and Hospitalist notified.

## 2020-02-08 NOTE — Progress Notes (Addendum)
Subjective: Per [name], NP patient was able to communicate more over the weekend especially after seeing her son at some point.  However is back to her nonverbal state today.   ROS: Unable to obtain due to poor mental status  Examination  Vital signs in last 24 hours: Temp:  [97.9 F (36.6 C)-98.8 F (37.1 C)] 98.7 F (37.1 C) (10/18 1106) Pulse Rate:  [87-97] 93 (10/18 1106) Resp:  [18-20] 18 (10/18 1106) BP: (121-143)/(91-98) 136/92 (10/18 1106) SpO2:  [96 %-99 %] 98 % (10/18 1106)  General: lying in bed, not in apparent distress CVS: pulse-normal rate and rhythm RS: breathing comfortably, CTA B Extremities: normal, warm Neuro: Awake, alert, acknowledges examiner when I walked in the room, pupils equally round and reactive, blinks to threat bilaterally, no apparent facial asymmetry, does not follow any commands, nonverbal, moving all 4 extremities spontaneously, reflexes 2+ bilaterally symmetric   Basic Metabolic Panel: Recent Labs  Lab 02/03/20 0131 02/07/20 0139  NA 136 137  K 3.9 3.7  CL 102 101  CO2 28 27  GLUCOSE 148* 138*  BUN 12 16  CREATININE 0.53 0.64  CALCIUM 9.2 9.5  MG 2.2  --   PHOS 3.6  --     CBC: Recent Labs  Lab 02/03/20 0131  WBC 4.8  NEUTROABS 2.0  HGB 13.9  HCT 40.2  MCV 91.6  PLT 278     Coagulation Studies: No results for input(s): LABPROT, INR in the last 72 hours.  Imaging No new brain imaging overnight  ASSESSMENT AND PLAN: 34 year old femalewho initially presented to [facility] on 01/06/2020 with encephalopathy. 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 Keppra was started and eventually Vimpat was added with improvementinEEG but patient continued to be altered and therefore transferred to [facility]. On arrival, EEG showed sharp waves in left central temporal region as well as lateralized rhythmic delta activity in left hemisphere which improved after  adding phenytoin and therefore was most likely ictal in nature. Since then phenobarbital also been added with further improvement in EEG (no more sharp waves). Of note, patient underwent lumbar puncture at [facility] on 01/05/2020 which did not show any pleocytosis and had normal protein and glucose.  CT chest abdomen pelvis was done as a part of paraneoplastic work-up and did not show any evidence of malignancy.  Paraneoplastic panel was sent to [facility] and was negative. 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 During this whole time, patient has had fluctuating mental status.  Suddenly at times she is able to speak, follows simple commands and able to provide history for a day or 2 and then again becomes nonverbal.  She is also been seen by psych and started on benzodiazepines for possible catatonia without any change in mental status/behavior.  Keppra was also discontinued due to concern for behavioral side effects.  Encephalopathy Nonconvulsive status epilepticus (resolved) Polysubstance abuse - Differentials include autoimmune/paraneoplastic encephalitisversus psychiatric etiology -Due to repeated waxing and waning mental status, I suspect that patient has neurologic as well as psychiatric basis of current nonverbal state.   Recommendations -Continue Vimpat200 mg twice daily and phenobarb 60 mg twice daily,phenytoin 200 mg twice daily -Continueamantadine 100 mg twice dailyfor neuro stimulation -Called and left message for [facility] transfer team to discuss her case further and if needed for possible transfer to [facility] -We will also call and  discuss case with psychiatry attending again -Continue seizure precautions -As needed IV Ativan 2 mg for clinical seizure-like activity -Management of rest of comorbidities per primary team  ADDENDUM: -Received call back from [facility], spoke with resident who  discussed the case with his attending on call.  They recommended serum autoimmune panel but patient has already received IVIG and therefore they agree that inpatient work-up is complete at this point and recommended outpatient follow-up with neurology.  Thank you for allowing [place] to participate in the care of this patient. Neurology will sign off. Please page or contact us if you have any further questions  I have spent a total of52minuteswith the patient reviewing hospitalnotes, test results, labs and examining the patient as well as establishing an assessment and plan.>50% of time was spent in direct patient care.   Lindie Spruce Epilepsy [facility] For questions after 5pm please refer to AMION to reach the Neurologist on call

## 2020-02-09 ENCOUNTER — Inpatient Hospital Stay (HOSPITAL_COMMUNITY): Payer: Medicaid Other

## 2020-02-09 LAB — GLUCOSE, CAPILLARY
Glucose-Capillary: 118 mg/dL — ABNORMAL HIGH (ref 70–99)
Glucose-Capillary: 121 mg/dL — ABNORMAL HIGH (ref 70–99)
Glucose-Capillary: 123 mg/dL — ABNORMAL HIGH (ref 70–99)
Glucose-Capillary: 128 mg/dL — ABNORMAL HIGH (ref 70–99)
Glucose-Capillary: 136 mg/dL — ABNORMAL HIGH (ref 70–99)
Glucose-Capillary: 213 mg/dL — ABNORMAL HIGH (ref 70–99)
Glucose-Capillary: 78 mg/dL (ref 70–99)

## 2020-02-09 MED ORDER — IOHEXOL 300 MG/ML  SOLN
50.0000 mL | Freq: Once | INTRAMUSCULAR | Status: AC | PRN
  Administered 2020-02-09: 20 mL

## 2020-02-09 NOTE — Progress Notes (Signed)
  Speech Language Pathology Treatment: Dysphagia  [name] MRN: [id number] DOB: 03-Nov-1985 Today's Date: 02/09/2020 Time: [id number] SLP Time Calculation (min) (ACUTE ONLY): 16 min  Assessment / Plan / Recommendation Clinical Impression  New orders received for swallow assessment and speech-language evaluation. Pt has been followed for dysphagia on Dys 1 (puree) texture, thin liquids. Today was this therapists first interaction with pt. Jalecia intermittently made brief eye contact and responded verbally and gestured several times during first 5 minutes then attention waned, stared out window, grimacing and pushing on bed (suspected pain but she did not respond). She readily accepted cup grape juice from therapist and consumed 3 oz continuously without s/s aspiration. Given two food choices she reached for the snack size [employer]. Functional mastication and oral clearance. Explained texture options to which she did not respond - SLP will upgrade to regular texture and see if her intake increases. ST will continue to follow.   Speech-language-cognitive assessment ordered however at present will be difficult to obtain accurate ability due to her willingness or ability to respond given psyc history. If she is able/willing to participate to a greater degree or significantly more verbal than present, this can be evaluated.    HPI HPI: Pt is a 34 year old woman who presented to [facility] 01/04/2020 with suspected methamphetamine use-discharged and brought back the same day with confusion and agitation. Toxicology consistent only with benzodiazepine and THC and not on any stimulants. 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 Per chart, mentation has been fluctuating. PMH includes hepatitis C and polysubstance abuse.      SLP Plan  Continue with current plan of care       Recommendations  Diet recommendations: Regular;Thin liquid Liquids provided  via: Cup;Straw Medication Administration: Via alternative means Supervision: Full supervision/cueing for compensatory strategies;Staff to assist with self feeding Compensations: Minimize environmental distractions;Slow rate;Small sips/bites Postural Changes and/or Swallow Maneuvers: Seated upright 90 degrees                Oral Care Recommendations: Oral care BID Follow up Recommendations: Skilled Nursing facility;24 hour supervision/assistance SLP Visit Diagnosis: Dysphagia, unspecified (R13.10) Plan: Continue with current plan of care       GO                Royce Macadamia 02/09/2020, 3:39 PM  Breck Coons Madilynn Montante M.Ed Nurse, children's [phone] Office [phone]

## 2020-02-09 NOTE — Progress Notes (Signed)
[facility] PROGRESS NOTE  Anita Davis NFA:[id number] DOB: [birth year] DOA: 01/04/2020 PCP: Jacquelin HawkingMcElroy, Shannon, PA-C  Status: Inpatient-Remains inpatient appropriate because:Altered mental status, Ongoing diagnostic testing needed not appropriate for outpatient work up and Unsafe d/c plan -plan for PEG tube placement on 10/5   Dispo: The patient is from: Home              Anticipated d/c is to: SNF              Anticipated d/c date is: > 3 days              Patient currently is not medically stable to d/c.  Due to UNSAFE DC PLAN-patient needs placement in skilled nursing facility based on altered mentation and inability to manage self-care.  She currently requires 24/7 care due to delay from presumptive diagnosis of autoimmune encephalitis.  She is currently bedbound and awaiting PEG tube placement patient does have a dysphagia 1 diet ordered but given her variable mentation she is inconsistently and unsafely able to manage oral intake.  Over the past 24 hours she has developed improving mentation that is waxing and waning and appears to indicate that patient may be eligible for inpatient rehabilitative services as opposed to SNF placement.  Patient is self-pay financial counseling aware that patient needs work-up for Medicaid and disability.  She may also need to have a family member established as her legal guardian.  10/4 TOC spoke with patient's aunt Anita Davis and updated her on plans to have financial counseling review case and reach out to family regarding application for Medicaid, disability and guardianship is necessary.  Anita Davis says she is currently guardian for another family member with TBI and is uncertain if she could manage to guardianship but will discuss with other members of the family.  Ending on 10/15 patient began having more consistent episodes of wakefulness. She actually awakened and became talkative while in the preop holding area and remained awake and interactive  with staff and family throughout the evening. Since that time she has had some episodes of complete lethargy but for the past 48 hours as of 10/19 she is awake and following commands although has extremely flat affect and is very slow to respond. Psych consult requested but given patient's waxing and waning mentation and limited verbal response when awake do not suspect at this point she would benefit from this. On Friday 10/15 patient was eager to speak to psychiatric team but when I discussed this with her again on 10/19 she stated "no"   Code Status: Full Family Communication: Updated Anita Davis 10/8-son and family friend at bedside on 10/15 DVT prophylaxis: Lovenox Vaccination status: Has not received Covid vaccine.  HPI: 34 y.o. F with hx substance use disorder, active, hepatitis C, and gestational diabetes who presented with confusion.  Patient initially admitted in September with encephalopathy, thought to be from drug overdose.  LP showed no signs of infection. MRI brain unremarkable. She remained encephalopathic, and EEG showed focal seizures.  The seizures were suppressed, and [facility], but after seizure control, she had poor neurologic recovery.  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  She deteriorated after steroids were stopped, so IVIG was started.  Subjective: Awake but not really alert although will respond to you and talk. Only flat affect. Denies any pain.  Objective: Vitals:   02/09/20 0342 02/09/20 0805  BP:  117/90 124/86  Pulse: 85 93  Resp: 18 18  Temp: 98.4 F (36.9 C) (!) 97.4 F (36.3 C)  SpO2: 96% 97%    Intake/Output Summary (Last 24 hours) at 02/09/2020 1121 Last data filed at 02/08/2020 1615 Gross per 24 hour  Intake --  Output 550 ml  Net -550 ml   Filed Weights   02/05/20 0606 02/07/20 0500 02/09/20 0500   Weight: 76.8 kg 74.9 kg 64.2 kg    Exam:  Constitutional: NAD, awake, very slow to respond Respiratory: clear to auscultation bilaterally, Normal respiratory effort. No accessory muscle use.  Room air Cardiovascular: Regular rate and rhythm, no murmurs / rubs / gallops. No extremity edema. 2+ pedal pulses.   Abdomen: no tenderness patient, no masses palpated.  Does appear to be slightly distended or bloated.  Bowel sounds positive but tingling primarily in the right mid quadrant.  G-tube in place but apparently became dislodged overnight so currently not being utilized until after x-ray studies Musculoskeletal: no clubbing / cyanosis. No joint deformity upper and lower extremities.no contractures.  No tremors or spasticity  Skin: no rashes, lesions, ulcers. No induration Neurologic: CN 2-12 grossly intact, weakly moves all extremities spontaneously, FSC although very slow to respond. With coaching she was finally able to understand that I requested that she pulled back on my hands as if she were trying to pull me into the bed. At that time strength in upper extremities noted to be around 2/5. Sensation intact.  No focal neurological deficits appreciated. 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   -The specific etiology remains unclear, but the differential for this appears to be an autoimmune encephalitis, or post status epilepticus encephalopathy.  TSH normal.  Neuraxial imaging unremarkable.  CT C/A/P shows no signs of cancer, paraneoplastic encephalitis seems unlikely.  The clinical picture is not consistent with a true psychiatric mediated catatonia, nor an opiate/alcohol withdrawal syndrome at this point (see below regarding psychiatry recommendations).    -Completed  IVIG 10/1. Upon review of neurology notes they have documented that it may take several weeks before full effects/potential improvement from IVIG seen.  Will need reevaluation with neurologist 6 weeks from 01/22/2020 to determine if any significant improvements or whether this will be patient's baseline mentation. -of note, autoimmune studies performed on CSF fluid and apparently are still at the [facility] lab -Continue phenobarbital, Vimpat, and Dilantin-Dilantin level 2.9 on 10/6 therefore Dilantin changed from 100 mg TID to 200 mg BID; neuro recommends gradual down titration and subsequent discontinuation of AEDs after discharge if remains seizure-free-had recent EEG without any recurrent seizure activity -consider repeat EEG and MRI in 6 to 8 weeks after discharge -Evaluated by psychiatry on 9/29-they added low-dose Seroquel but no improvement in symptoms therefore this was discontinued. -Xanax titrated and discontinued on 10/14 -Continue empiric thiamine and vitamin B6 replacement -Continues to have waxing and waning mentation but over the past 48 hours has had more wakeful episodes with severely flat affect, follows commands but is slow to respond and has been able to participate somewhat with PT  Suspected chronic depression in context of ongoing polysubstance abuse prior to admission -History obtained from family that patient has been demonstrating symptoms of depression for several months that had worsened just prior to presentation -Given waxing and waning mental status suspect a combination of recent  neurological insult as well as severe depression contributing to mentation issues  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 coli UTI -10/5 bladder scan w/ > 700 cc so I/O cath ordered -cont q shift bladder scans -Urine culture positive for pansensitive E. Coli -Has completed 3 days of IV Rocephin  Mild hypokalemia -Potassium 3.9 on 10/13 -Continue per tube replacement of potassium -Follow labs PRN  Ileus probably due to constipation -Symptoms have resolved and patient tolerating full rate tube feeding at this juncture and having regular bowel movements  Nonobstructive transaminitis/hepatitis C -Elevated HCV antibody with markedly elevated HCV RNA quantitative level  consistent with chronic hepatitis C   Nutrition Status/moderate to severe protein calorie malnutrition: Nutrition Problem: Inadequate oral intake Etiology: lethargy/confusion Signs/Symptoms: meal completion < 25% Interventions: Tube feeding, Prostat Estimated body mass index is 23.55 kg/m as calculated from the following:   Height as of this encounter: 5\' 5"  (1.651 m).   Weight as of this encounter: 64.2 kg.  Yeast vaginitis -Completed intravaginal Monistat    Data Reviewed: Basic Metabolic Panel: Recent Labs  Lab 02/03/20 0131 02/07/20 0139  NA 136 137  K 3.9 3.7  CL 102 101  CO2 28 27  GLUCOSE 148* 138*  BUN 12 16  CREATININE 0.53 0.64  CALCIUM 9.2 9.5  MG 2.2  --   PHOS 3.6  --    Liver Function Tests: Recent Labs  Lab 02/03/20 0131  AST 80*  ALT 121*  ALKPHOS 86  BILITOT 0.6  PROT 7.6  ALBUMIN 3.1*   No results for input(s): LIPASE, AMYLASE in the last 168 hours. No results for input(s): AMMONIA in the last 168 hours. CBC: Recent Labs  Lab 02/03/20 0131  WBC 4.8  NEUTROABS 2.0  HGB 13.9  HCT 40.2  MCV 91.6  PLT 278   Cardiac Enzymes: No results for input(s): CKTOTAL, CKMB, CKMBINDEX, TROPONINI in the last 168 hours. BNP (last 3 results) No results for input(s): BNP in the last 8760 hours.  ProBNP (last 3  results) No results for input(s): PROBNP in the last 8760 hours.  CBG: Recent Labs  Lab 02/08/20 1608 02/08/20 2018 02/09/20 0010 02/09/20 0339 02/09/20 0805  GLUCAP 138* 96 121* 136* 123*    Recent Results (from the past 240 hour(s))  MRSA PCR Screening     Status: Abnormal   Collection Time: 02/08/20  8:28 AM   Specimen: Nasal Mucosa; Nasopharyngeal  Result Value Ref Range Status   MRSA by PCR POSITIVE (A) NEGATIVE Final    Comment:        The GeneXpert MRSA Assay (FDA approved for NASAL specimens only), is one component of a comprehensive MRSA colonization surveillance program. It is not intended to diagnose MRSA infection nor to guide or monitor treatment for MRSA infections. RESULT CALLED TO, READ BACK BY AND VERIFIED WITH: 02/10/20 RN 10:35 02/08/20 ([email or web address]) Performed at [facility], [address], [place], [place]      Studies: No results found.  Scheduled Meds: . amantadine  100 mg Per Tube BID  . Chlorhexidine Gluconate Cloth  6 each Topical Daily  . enoxaparin (LOVENOX) injection  40 mg Subcutaneous Q24H  .  free water  200 mL Per Tube Q6H  . insulin aspart  0-9 Units Subcutaneous TID WC  . PHENObarbital  65 mg Intravenous BID  . polyethylene glycol  17 g Per Tube Daily  . potassium chloride  40 mEq Per Tube Daily  . pyridOXINE  100 mg Intravenous Daily  . senna  1 tablet Per Tube QHS  . thiamine injection  100 mg Intravenous Q24H   Continuous Infusions: . dextrose 5 % and 0.9 % NaCl with KCl 20 mEq/L Stopped (02/05/20 1048)  . famotidine (PEPCID) IV    . feeding supplement (OSMOLITE 1.2 CAL) 1,000 mL (02/07/20 1534)  . lacosamide (VIMPAT) IV 200 mg (02/09/20 1052)  . phenytoin (DILANTIN) IV 125 mg (02/09/20 5400)    Principal Problem:   Refractory seizure (HCC) Active Problems:   Acute metabolic encephalopathy   Hypokalemia   Polysubstance abuse (HCC)   Elevated CK   Transaminitis   Chronic hepatitis C without hepatic  coma (HCC)   Distended abdomen   Palliative care encounter   Colonic Ileus [facility])   Consultants:  Neurology  Psychiatry  Interventional radiology  Surgery  Procedures:  9/14 lumbar puncture  9/15 EEG  9/16 EEG  9/17 EEG  9/19 overnight EEG with video  9/22 overnight EEG with video with discontinuation of long-term EEG monitoring on 9/25  9/24 core track placement  10/6 EEG  Antibiotics: Anti-infectives (From admission, onward)   Start     Dose/Rate Route Frequency Ordered Stop   02/05/20 0600  ceFAZolin (ANCEF) IVPB 2g/100 mL premix        2 g 200 mL/hr over 30 Minutes Intravenous To Short Stay 02/04/20 1054 02/05/20 0950   01/29/20 0600  ceFAZolin (ANCEF) IVPB 2g/100 mL premix        2 g 200 mL/hr over 30 Minutes Intravenous On call to O.R. 01/28/20 1511 01/30/20 0559   01/28/20 2000  cefTRIAXone (ROCEPHIN) 1 g in sodium chloride 0.9 % 100 mL IVPB        1 g 200 mL/hr over 30 Minutes Intravenous Every 24 hours 01/28/20 1925 02/02/20 0724   01/06/20 0900  cefTRIAXone (ROCEPHIN) 2 g in sodium chloride 0.9 % 100 mL IVPB  Status:  Discontinued        2 g 200 mL/hr over 30 Minutes Intravenous Every 12 hours 01/06/20 0105 01/06/20 2202   01/06/20 0800  vancomycin (VANCOCIN) IVPB 1000 mg/200 mL premix  Status:  Discontinued       "Followed by" Linked Group Details   1,000 mg 200 mL/hr over 60 Minutes Intravenous Every 12 hours 01/05/20 1904 01/06/20 2202   01/05/20 2000  vancomycin (VANCOREADY) IVPB 1500 mg/300 mL       "Followed by" Linked Group Details   1,500 mg 150 mL/hr over 120 Minutes Intravenous  Once 01/05/20 1904 01/06/20 0127   01/05/20 1830  cefTRIAXone (ROCEPHIN) 2 g in sodium chloride 0.9 % 100 mL IVPB        2 g 200 mL/hr over 30 Minutes Intravenous  Once 01/05/20 1829 01/05/20 2220       Time spent: 25    [name] ANP  [facility] Pager [phone]. If 7PM-7AM, please contact night-coverage at [email or web address] 02/09/2020, 11:21 AM   LOS: 34 days

## 2020-02-09 NOTE — Progress Notes (Signed)
MD notified that PEG TUBE is not well positioned and not suitable for feeding.

## 2020-02-10 ENCOUNTER — Inpatient Hospital Stay (HOSPITAL_COMMUNITY): Payer: Medicaid Other

## 2020-02-10 LAB — CBC
HCT: 35.3 % — ABNORMAL LOW (ref 36.0–46.0)
Hemoglobin: 12 g/dL (ref 12.0–15.0)
MCH: 32 pg (ref 26.0–34.0)
MCHC: 34 g/dL (ref 30.0–36.0)
MCV: 94.1 fL (ref 80.0–100.0)
Platelets: 211 10*3/uL (ref 150–400)
RBC: 3.75 MIL/uL — ABNORMAL LOW (ref 3.87–5.11)
RDW: 13.8 % (ref 11.5–15.5)
WBC: 6.9 10*3/uL (ref 4.0–10.5)
nRBC: 0 % (ref 0.0–0.2)

## 2020-02-10 LAB — GLUCOSE, CAPILLARY
Glucose-Capillary: 104 mg/dL — ABNORMAL HIGH (ref 70–99)
Glucose-Capillary: 104 mg/dL — ABNORMAL HIGH (ref 70–99)
Glucose-Capillary: 107 mg/dL — ABNORMAL HIGH (ref 70–99)
Glucose-Capillary: 122 mg/dL — ABNORMAL HIGH (ref 70–99)
Glucose-Capillary: 131 mg/dL — ABNORMAL HIGH (ref 70–99)
Glucose-Capillary: 134 mg/dL — ABNORMAL HIGH (ref 70–99)

## 2020-02-10 MED ORDER — PROSOURCE TF PO LIQD
45.0000 mL | Freq: Every day | ORAL | Status: DC
  Administered 2020-02-12 – 2020-02-17 (×6): 45 mL
  Filled 2020-02-10 (×6): qty 45

## 2020-02-10 NOTE — H&P (Signed)
Chief Complaint: Patient was seen in consultation today for malpositioned surgically placed g-tube, request for replacement/new placement  Referring Physician(s): Hosie Spangle, [place]  Supervising Physician: Gilmer Mor  Patient Status: Anita Davis  History of Present Illness: Anita Davis is a 34 y.o. female with a past medical history significant for substance abuse and hepatitis C. The patient has had a H&P performed within the last 30 days, all history, medications, and exam have been reviewed. Consult note for gastrostomy placement in IR was performed on 01/23/20 - please see this note for full details.  Briefly, Anita Davis was admitted to [facility] 01/04/20 with status epilepticus and has had persistent encephalopathy. She has been unable to tolerate PO intake and IR was initially consulted for percutaneous g-tube placement on 10/2 however she had significant fecal impaction and persistent colonic air which prevented administration of barium enema and safe percutaneous placement of g-tube in IR. General surgery was consulted and an 18 Fr g-tube was successfully laparoscopic placed on 10/15 by Dr. Andrey Campanile. Unfortunately on 10/18 patient's abdominal binder was found to be open and the g-tube retracted with a 1 of 2 sutures broken. KUB was unable to confirm location of gastrostomy tube, follow up CT today showed the g-tube to be withdrawn into the SQ tissue anterior to the rectus sheath. IR has been consulted for g-tube placement/replacement.  Anita Davis seen at bedside, she is alert and tracks me as I move around the room but does not follow commands or interact with me. Discussed procedure with patient's aunt Anita Davis via phone today who is agreeable with [place] replacing the tube - however she is concerned about Anita Davis not being in restraints currently and that she will pull on the g tube again.   Past Medical History:  Diagnosis Date   Allergy    Bronchitis    Hepatitis-C     History of gestational diabetes    HPV in female    Substance abuse (HCC)     Past Surgical History:  Procedure Laterality Date   HEMORRHOID BANDING  age 53   LAPAROSCOPIC GASTROSTOMY N/A 02/05/2020   Procedure: LAPAROSCOPIC GASTROSTOMY PLACEMENT;  Surgeon: Gaynelle Adu, MD;  Location: [facility];  Service: General;  Laterality: N/A;    Allergies: Patient has no known allergies.  Medications: Prior to Admission medications   Medication Sig Start Date End Date Taking? Authorizing Provider  cephALEXin (KEFLEX) 500 MG capsule Take 500 mg by mouth 2 (two) times daily. For 5 days. 12/24/19   [provider]  cetirizine (ZYRTEC) 10 MG tablet Take 10 mg by mouth daily.    [provider]  hydrOXYzine (ATARAX/VISTARIL) 25 MG tablet Take 1-2 tablets (25-50 mg total) by mouth every 6 (six) hours as needed for anxiety. Patient taking differently: Take 75 mg by mouth 5 (five) times daily.  12/31/19   Gilda Crease, MD  ibuprofen (ADVIL) 200 MG tablet Take 200 mg by mouth every 6 (six) hours as needed.    [provider]  METHADONE HCL PO Take 60 mg by mouth daily.     [provider]  ondansetron (ZOFRAN-ODT) 4 MG disintegrating tablet Take 4 mg by mouth 3 (three) times daily as needed for nausea or vomiting. Starting 12/24/2019 x 4 days. 12/24/19   [provider]  Polyethylene Glycol 3350 (MIRALAX PO) Take by mouth.    [provider]     Family History  Problem Relation Age of Onset   Depression Mother  Cervical cancer Mother    Hypertension Father    Diabetes Father     Social History   Socioeconomic History   Marital status: Single    Spouse name: Not on file   Number of children: Not on file   Years of education: Not on file   Highest education level: Not on file  Occupational History   Not on file  Tobacco Use   Smoking status: Current Every Day Smoker    Years: 24.00    Types: Cigarettes, Cigars    Smokeless tobacco: Never Used   Tobacco comment: 1 cigar/daily. former 1 pack cigarette smoker quit 05/2019  currently smokes CBD  Vaping Use   Vaping Use: Every day   Substances: Nicotine, Flavoring  Substance and Sexual Activity   Alcohol use: No   Drug use: Not Currently    Types: IV, Cocaine, Hydrocodone, Oxycodone, Marijuana, Heroin    Comment: takes methadone- now taking subutex (03/27/19)   Sexual activity: Not Currently    Birth control/protection: Abstinence  Other Topics Concern   Not on file  Social History Narrative   Not on file   Social Determinants of Health   Financial Resource Strain:    Difficulty of Paying Living Expenses: Not on file  Food Insecurity:    Worried About Programme researcher, broadcasting/film/video in the Last Year: Not on file   [employer] of Food in the Last Year: Not on file  Transportation Needs:    Lack of Transportation (Medical): Not on file   Lack of Transportation (Non-Medical): Not on file  Physical Activity:    Days of Exercise per Week: Not on file   Minutes of Exercise per Session: Not on file  Stress:    Feeling of Stress : Not on file  Social Connections:    Frequency of Communication with Friends and Family: Not on file   Frequency of Social Gatherings with Friends and Family: Not on file   Attends Religious Services: Not on file   Active Member of Clubs or Organizations: Not on file   Attends Banker Meetings: Not on file   Marital Status: Not on file     Review of Systems: A 12 point ROS discussed and pertinent positives are indicated in the HPI above.  All other systems are negative.  Review of Systems  Unable to perform ROS: Patient nonverbal    Vital Signs: BP 119/89 (BP Location: Left Arm)    Pulse 87    Temp 97.9 F (36.6 C) (Oral)    Resp 16    Ht 5\' 5"  (1.651 m)    Wt 167 lb 12.3 oz (76.1 kg)    SpO2 99%    BMI 27.92 kg/m   Physical Exam Vitals and nursing note reviewed.  Constitutional:       General: She is not in acute distress. Cardiovascular:     Rate and Rhythm: Normal rate and regular rhythm.  Pulmonary:     Effort: Pulmonary effort is normal.     Breath sounds: Normal breath sounds.  Abdominal:     General: There is distension.     Palpations: Abdomen is soft.     Tenderness: There is no abdominal tenderness.     Comments: (+) abdominal binder in place with small amount of dried blood over g-tube insertion site. Surgical g tube remains in place with 1 suture in tact. No purulent drainage or active bleeding noted. Palpation does not appear to elicit tenderness  Skin:    General: Skin is warm and dry.  Neurological:     Mental Status: She is alert. Mental status is at baseline.      MD Evaluation Airway: Other (comments) Airway comments: Unable to assess adequately - does not follow commands Heart: WNL Abdomen: WNL Chest/ Lungs: WNL ASA  Classification: 3 Mallampati/Airway Score: Two   Imaging: CT ABDOMEN WO CONTRAST  Result Date: 02/10/2020 CLINICAL DATA:  Gastrostomy mild function. EXAM: CT ABDOMEN WITHOUT CONTRAST TECHNIQUE: Multidetector CT imaging of the abdomen was performed following the standard protocol without IV contrast. COMPARISON:  01/18/2020 FINDINGS: Lower chest: The visualized lung bases are clear bilaterally. The visualized heart and pericardium are unremarkable. Hepatobiliary: No focal liver abnormality is seen. No gallstones, gallbladder wall thickening, or biliary dilatation. Pancreas: Unremarkable Spleen: Unremarkable Adrenals/Urinary Tract: The adrenal glands are unremarkable. The kidneys are normal in size and position. Punctate 1-2 mm nonobstructing calculi are seen within the interpolar region of the left kidney and lower pole of the kidneys bilaterally. No hydronephrosis. No significant perinephric stranding or perinephric fluid collections. Stomach/Bowel: Since the prior examination, a balloon retention percutaneous gastrostomy has been  placed, however, this appears to have been withdrawn and its tip is now seen within the subcutaneous soft tissues of the left upper quadrant of the abdomen superficial to the rectus sheath. There is thickening of the rectus sheath as well as intramuscular hyperdensity in keeping with intramuscular hemorrhage, likely related to recent surgical procedure. The stomach appears a fixed to the anterior peritoneum subjacent to the rectus sheath in this location suggesting gastropexy. There is no free intraperitoneal gas or fluid within the upper abdomen. Subcutaneous gas within the anterior abdominal wall bilaterally likely relates to recent surgical gastrostomy. No discrete drainable fluid collection identified within the subcutaneous soft tissues. Vascular/Lymphatic: No significant vascular findings are present. No enlarged abdominal or pelvic lymph nodes. Other: Tiny broad-based fat containing umbilical hernia. Nodular infiltration within the subcutaneous fat of the visualized lower quadrant anterior abdominal wall likely relates to subcutaneous injection. Musculoskeletal: No acute bone abnormality. IMPRESSION: Recently placed gastrostomy catheter has been withdrawn into the subcutaneous fat of the anterior abdominal wall anterior to the rectus sheath. No associated discrete drainable fluid collection. Thickening of the rectus sheath likely related to intramuscular hemorrhage related to recent surgical procedure. Stomach remains fixed to the anterior peritoneal wall, likely related to underlying gastropexy, and no free intraperitoneal gas is identified. These results will be called to the ordering clinician or representative by the Radiologist Assistant, and communication documented in the PACS or [employer]. Electronically Signed   By: Helyn Numbers MD   On: 02/10/2020 10:40   DG Abd 1 View  Result Date: 01/23/2020 CLINICAL DATA:  Ileus EXAM: ABDOMEN - 1 VIEW COMPARISON:  January 22, 2020 FINDINGS: Persistent  diffuse gaseous dilation of colon. Large colonic stool burden. Incomplete visualization of the pubic symphysis. Feeding tube tip terminates over the proximal stomach. Incomplete visualization of the upper abdomen. No acute osseous abnormality. IMPRESSION: 1. Persistent diffuse gaseous dilation of colon likely reflecting ileus versus underlying dysmotility. Large colonic stool burden. 2.  Feeding tube tip terminates over the proximal stomach. Electronically Signed   By: Meda Klinefelter MD   On: 01/23/2020 10:34   DG Abd 1 View  Result Date: 01/22/2020 CLINICAL DATA:  Abdominal distension. EXAM: ABDOMEN - 1 VIEW COMPARISON:  CT chest, abdomen and pelvis 01/18/2020. FINDINGS: Feeding tube is in place with the tip in the distal stomach. There is  a large volume of stool and gaseous distention of the colon. No evidence of free intraperitoneal air on this single view. No acute bony abnormality. IMPRESSION: Findings compatible with ileus. Large colonic stool burden also noted. Feeding tube tip is in the distal stomach. Electronically Signed   By: Drusilla Kannerhomas  Dalessio M.D.   On: 01/22/2020 12:44   CT CHEST W CONTRAST  Result Date: 01/18/2020 CLINICAL DATA:  Neoplastic syndrome suspected, encephalopathy status epilepticus EXAM: CT CHEST, ABDOMEN, AND PELVIS WITH CONTRAST TECHNIQUE: Multidetector CT imaging of the chest, abdomen and pelvis was performed following the standard protocol during bolus administration of intravenous contrast. CONTRAST:  100mL OMNIPAQUE IOHEXOL 300 MG/ML  SOLN COMPARISON:  January 23, 2017 FINDINGS: CT CHEST FINDINGS Cardiovascular: Heart size is normal. Aortic caliber is normal. Central pulmonary vasculature is normal caliber, limited assessment on venous phase. Mediastinum/Nodes: Thoracic inlet structures without signs of adenopathy. Small lymph node at the LEFT thoracic inlet approximately 5 mm. No axillary lymphadenopathy. No mediastinal or hilar lymphadenopathy. Lungs/Pleura: Lungs are  clear.  Airways are patent. Musculoskeletal: See below for full musculoskeletal detail. No chest wall lesion. CT ABDOMEN PELVIS FINDINGS Hepatobiliary: No focal, suspicious hepatic lesion. No portal venous abnormality. No pericholecystic stranding.  No gross biliary duct distension. Pancreas: Pancreas with normal contours. Normal enhancement and without signs of adjacent inflammation. Spleen: Spleen normal in size and contour. Adrenals/Urinary Tract: Adrenal glands are normal. Mild cortical scarring on the RIGHT. No hydronephrosis. No nephrolithiasis on contrasted imaging. Foley catheter collapses the urinary bladder. Small low-density lesions in the LEFT kidney stable since 2018 and likely small cysts Stomach/Bowel: Weighted tip tube in the gastric antrum. Stomach is collapsed limiting assessment without acute process. Normal appendix. Small bowel without signs of obstruction. Stool throughout the colon.  No pericolonic stranding. Vascular/Lymphatic: Normal caliber abdominal aorta. No signs of vascular disease or atherosclerotic changes. There is no gastrohepatic or hepatoduodenal ligament lymphadenopathy. No retroperitoneal or mesenteric lymphadenopathy. No pelvic sidewall lymphadenopathy. Reproductive: Uterus and adnexa are unremarkable by CT aside from some venous engorgement along the LEFT parametrium. Other: Fat containing umbilical hernia. Musculoskeletal: No acute bone finding. No destructive bone process. IMPRESSION: 1. No acute findings in the chest, abdomen or pelvis. 2. No signs of neoplasm by CT or metastatic disease. 3. venous engorgement along the LEFT parametrium, which can be seen with pelvic congestion syndrome. Electronically Signed   By: Donzetta KohutGeoffrey  Wile M.D.   On: 01/18/2020 20:59   MR BRAIN WO CONTRAST  Result Date: 01/16/2020 CLINICAL DATA:  Seizure, altered mental status. EXAM: MRI HEAD WITHOUT CONTRAST TECHNIQUE: Multiplanar, multiecho pulse sequences of the brain and surrounding  structures were obtained without intravenous contrast. COMPARISON:  01/10/2020 head CT.  01/05/2020 MRI head. FINDINGS: Please note that motion artifact limits evaluation. Brain: No diffusion-weighted signal abnormality. No intracranial hemorrhage. Cerebral volume is within normal limits. No midline shift, ventriculomegaly or extra-axial fluid collection. No mass lesion. Vascular: Grossly preserved major intracranial flow voids. Skull and upper cervical spine: Normal marrow signal. Sinuses/Orbits: Normal orbits. Clear paranasal sinuses. No mastoid effusion. Other: None. IMPRESSION: No acute intracranial process. Motion degradation limits evaluation. Electronically Signed   By: Stana Buntinghikanele  Emekauwa M.D.   On: 01/16/2020 21:36   MR BRAIN W CONTRAST  Result Date: 01/19/2020 CLINICAL DATA:  Neoplastic workup. Status epilepticus. Polysubstance abuse. EXAM: MRI HEAD WITH CONTRAST TECHNIQUE: Multiplanar, multiecho pulse sequences of the brain and surrounding structures were obtained with intravenous contrast. CONTRAST:  7.545mL GADAVIST GADOBUTROL 1 MMOL/ML IV SOLN COMPARISON:  MRI head  without contrast 01/16/2020 FINDINGS: The patient not able to cooperate and hold still. Attempted repeat axial and coronal T2 images were performed and do not reveal any evidence of acute abnormality. Diffusion not performed. Postcontrast imaging degraded by motion but does not reveal any abnormal enhancement. No enhancing mass or fluid collection. IMPRESSION: Exam quality limited by motion. No acute abnormality.  Normal enhancement pattern. Electronically Signed   By: Marlan Palau M.D.   On: 01/19/2020 10:19   CT ABDOMEN PELVIS W CONTRAST  Result Date: 01/18/2020 CLINICAL DATA:  Neoplastic syndrome suspected, encephalopathy status epilepticus EXAM: CT CHEST, ABDOMEN, AND PELVIS WITH CONTRAST TECHNIQUE: Multidetector CT imaging of the chest, abdomen and pelvis was performed following the standard protocol during bolus administration  of intravenous contrast. CONTRAST:  OMNIPAQUE IOHEXOL 300 MG/ML  SOLN COMPARISON:  January 23, 2017 FINDINGS: CT CHEST FINDINGS Cardiovascular: Heart size is normal. Aortic caliber is normal. Central pulmonary vasculature is normal caliber, limited assessment on venous phase. Mediastinum/Nodes: Thoracic inlet structures without signs of adenopathy. Small lymph node at the LEFT thoracic inlet approximately 5 mm. No axillary lymphadenopathy. No mediastinal or hilar lymphadenopathy. Lungs/Pleura: Lungs are clear.  Airways are patent. Musculoskeletal: See below for full musculoskeletal detail. No chest wall lesion. CT ABDOMEN PELVIS FINDINGS Hepatobiliary: No focal, suspicious hepatic lesion. No portal venous abnormality. No pericholecystic stranding.  No gross biliary duct distension. Pancreas: Pancreas with normal contours. Normal enhancement and without signs of adjacent inflammation. Spleen: Spleen normal in size and contour. Adrenals/Urinary Tract: Adrenal glands are normal. Mild cortical scarring on the RIGHT. No hydronephrosis. No nephrolithiasis on contrasted imaging. Foley catheter collapses the urinary bladder. Small low-density lesions in the LEFT kidney stable since 2018 and likely small cysts Stomach/Bowel: Weighted tip tube in the gastric antrum. Stomach is collapsed limiting assessment without acute process. Normal appendix. Small bowel without signs of obstruction. Stool throughout the colon.  No pericolonic stranding. Vascular/Lymphatic: Normal caliber abdominal aorta. No signs of vascular disease or atherosclerotic changes. There is no gastrohepatic or hepatoduodenal ligament lymphadenopathy. No retroperitoneal or mesenteric lymphadenopathy. No pelvic sidewall lymphadenopathy. Reproductive: Uterus and adnexa are unremarkable by CT aside from some venous engorgement along the LEFT parametrium. Other: Fat containing umbilical hernia. Musculoskeletal: No acute bone finding. No destructive bone  process. IMPRESSION: 1. No acute findings in the chest, abdomen or pelvis. 2. No signs of neoplasm by CT or metastatic disease. 3. venous engorgement along the LEFT parametrium, which can be seen with pelvic congestion syndrome. Electronically Signed   By: Donzetta Kohut M.D.   On: 01/18/2020 20:59   DG ABDOMEN PEG TUBE LOCATION  Result Date: 02/09/2020 CLINICAL DATA:  Peg tube placement EXAM: ABDOMEN - 1 VIEW COMPARISON:  02/03/2020 FINDINGS: Single view of the abdomen following injection of Omnipaque 300 through patient's PEG tube. Amorphous collection of contrast around the PEG tube entry site. No definite intraluminal contrast is visible. The gas pattern is unobstructed. IMPRESSION: Intragastric location of gastrostomy tube is not confirmed on this study. Amorphous contrast collection about the tube entry site probably represents soft tissue extravasation of contrast. More precise positioning of tube could be ascertained with CT or tube injection under direct fluoroscopic observation. These results will be called to the ordering clinician or representative by the Radiologist Assistant, and communication documented in the PACS or [employer]. Electronically Signed   By: Jasmine Pang M.D.   On: 02/09/2020 19:27   DG CHEST PORT 1 VIEW  Result Date: 01/31/2020 CLINICAL DATA:  Encounter for feeding  tube placement. EXAM: PORTABLE CHEST 1 VIEW COMPARISON:  January 22, 2020 FINDINGS: The heart size and mediastinal contours are within normal limits. Both lungs are clear. Feeding tube is identified with distal tip probably just distal to the GE junction in the proximal stomach. The visualized skeletal structures are unremarkable. IMPRESSION: 1. No active cardiopulmonary disease. 2. Feeding tube is identified with distal tip probably just distal to the GE junction in the proximal stomach. Electronically Signed   By: Sherian Rein M.D.   On: 01/31/2020 12:02   DG CHEST PORT 1 VIEW  Result Date:  01/22/2020 CLINICAL DATA:  Abdominal distension. EXAM: PORTABLE CHEST 1 VIEW COMPARISON:  Single-view of the chest 01/19/2020. FINDINGS: Lungs clear. Heart size normal. No pneumothorax or pleural fluid. Feeding tube courses into the stomach and below the inferior margin the film. IMPRESSION: No acute disease. Electronically Signed   By: Drusilla Kanner M.D.   On: 01/22/2020 12:45   DG CHEST PORT 1 VIEW  Result Date: 01/19/2020 CLINICAL DATA:  PICC placement. EXAM: PORTABLE CHEST 1 VIEW COMPARISON:  Chest radiographs 06/18/2019 and CT 01/18/2020 FINDINGS: A left PICC terminates near the superior cavoatrial junction. An enteric tube courses into the upper abdomen with tip not imaged. The cardiomediastinal silhouette is within normal limits. No confluent airspace opacity, edema, pleural effusion, or pneumothorax is identified. No acute osseous abnormality is seen. IMPRESSION: Left PICC terminates near the superior cavoatrial junction. Electronically Signed   By: Sebastian Ache M.D.   On: 01/19/2020 19:10   DG Abd 2 Views  Result Date: 02/03/2020 CLINICAL DATA:  Abdominal distension. EXAM: ABDOMEN - 2 VIEW COMPARISON:  02/01/2020 and 01/28/2020 FINDINGS: A feeding tube remains in place with tip in the expected region of the proximal duodenum. No intraperitoneal free air is identified. There is moderate gaseous distension of bowel loops throughout the abdomen and pelvis which appears to predominantly reflect colon and is than on 01/28/2020. Gas is present in the rectum. There are air-fluid levels in the colon. No acute osseous abnormality is seen. IMPRESSION: Increased diffuse gaseous distension of the colon which may reflect ileus. Electronically Signed   By: Sebastian Ache M.D.   On: 02/03/2020 14:37   DG Abd 2 Views  Result Date: 01/28/2020 CLINICAL DATA:  Obstipation EXAM: ABDOMEN - 2 VIEW COMPARISON:  01/25/2020 FINDINGS: Enteric feeding tube is positioned with tip near the gastroesophageal junction.  Diffusely gas-filled small bowel and colon throughout the abdomen, similar in caliber and configuration compared to prior examination although is significantly decreased burden of stool in the distal colon. No free air in the abdomen on supine and erect radiographs. Included lower chest is unremarkable. IMPRESSION: 1. Diffusely gas-filled small bowel and colon throughout the abdomen, similar in caliber and configuration compared to prior examination although there is significantly decreased burden of stool in the distal colon. No free air in the abdomen on supine and erect radiographs. 2. Enteric feeding tube is position with tip near the gastroesophageal junction. Recommend advancement to ensure subdiaphragmatic positioning. Electronically Signed   By: Lauralyn Primes M.D.   On: 01/28/2020 08:18   DG Abd Portable 1V  Result Date: 02/01/2020 CLINICAL DATA:  Evaluate feeding tube placement EXAM: PORTABLE ABDOMEN - 1 VIEW COMPARISON:  01/28/2020 FINDINGS: The tip of the feeding tube is well below the level of the GE junction in the expected location of the proximal duodenum/pylorus. IMPRESSION: Feeding tube tip in the proximal duodenum/pylorus. Electronically Signed   By: Veronda Prude.D.  On: 02/01/2020 09:59   DG Abd Portable 1V  Result Date: 01/25/2020 CLINICAL DATA:  Ileus EXAM: PORTABLE ABDOMEN - 1 VIEW COMPARISON:  01/23/2020 FINDINGS: Diffuse gaseous distention of colon seen previously has decreased in the interval. Feeding tube tip overlies the region of the esophagogastric junction/proximal stomach. Mild convex rightward lumbar scoliosis may be positional. IMPRESSION: Interval decrease in the diffuse gaseous colonic distention seen previously. Electronically Signed   By: Kennith Center M.D.   On: 01/25/2020 08:07   EEG adult  Result Date: 01/27/2020 Charlsie Quest, MD     01/27/2020  3:19 PM Patient Name: Belinda Schlichting MRN: [id number] Epilepsy Attending: Charlsie Quest Referring  Physician/Provider: Sheralyn Boatman, NP Date: 01/26/3030 Duration: 25.27 mins Patient history: 34 year old female who initially presented with seizures now with persistent altered mental status.  EEG to evaluate for seizures. Level of alertness: lethargic AEDs during EEG study: Keppra, Vimpat, phenytoin, phenobarb Technical aspects: This EEG study was done with scalp electrodes positioned according to the 10-20 International system of electrode placement. Electrical activity was acquired at a sampling rate of 500Hz  and reviewed with a high frequency filter of 70Hz  and a low frequency filter of 1Hz . EEG data were recorded continuously and digitally stored. Description: No clear posterior dominant rhythm was seen.  There is an excessive amount of 15 to 18 Hz beta activity distributed symmetrically and diffusely. Hyperventilation and photic stimulation were not performed.   ABNORMALITY -Excessive beta, generalized IMPRESSION: This study is within normal limits.  No seizures or epileptiform discharges were seen throughout the recording. The excessive beta activity seen in the background is most likely due to the effect of medications and is a benign EEG pattern. Priyanka   Overnight EEG with video  Result Date: 01/13/2020 , MD     01/14/2020  9:59 AM Patient Name:Louan Schubert 01/15/2020 Epilepsy Attending:Priyanka Charlsie Quest Referring Physician/Provider:Dr 01/16/2020 Duration:01/12/2020 1656 to 9/22/[id number]  Patient history:34 y.o.femalewith medical history significant forhepatitis C and polysubstance abuse whowas brought to the ED by EMS for altered mental statusand found to be in status.EEG to evaluate for seizure.  Level of alertness:Awake, asleep  AEDs during EEG study:LEV, Vmpat, PHT, Phenobarb  Technical aspects: This EEG study was done with scalp electrodes positioned according to the 10-20 International system of electrode placement. Electrical activity was  acquired at a sampling rate of 500Hz  and reviewed with a high frequency filter of 70Hz  and a low frequency filter of 1Hz . EEG data were recorded continuously and digitally stored.  Description: The posterior dominant rhythm consists of10Hz  activity of moderate voltage (25-35 uV) seen predominantly in posterior head regions, symmetric and reactive to eye opening and eye closing. Sleep was characterized by vertex waves, sleep spindles (12 to 14 Hz), maximal frontocentral region. Sharp waves in left centrotemporal region were noted, a times in runs mostly during sleep. EEG also showed intermittent rhythmic 3-6hz  theta-delta slowing in left centrotemporal regionHyperventilation and photic stimulation were not performed.   ABNORMALITY - Sharp waves, left centro-temporal region - Intermittent rhythmic slow, left centro-temporal region  IMPRESSION: This study showed evidence ofepileptogenicity as well as cortical dysfunction arising from left centrotemporal region.Lateralized rhythmic slowing in left centro-temporal region  Is on the ictal-interictal continuum with low potential for seizures. No definite seizures were seen during this study.   Brooke Dare   01/14/2020 EKG SITE RITE  Result Date: 01/19/2020 If [facility] image not attached, placement could not be confirmed due to current cardiac rhythm.  US Abdomen Limited RUQ  Result Date: 01/14/2020 CLINICAL DATA:  Elevated liver enzymes EXAM: ULTRASOUND ABDOMEN LIMITED RIGHT UPPER QUADRANT COMPARISON:  January 24, 2017 FINDINGS: Gallbladder: No gallstones or wall thickening visualized. No pericholecystic fluid. No sonographic Murphy sign noted by sonographer. Note that only supine imaging could be achieved due to restrictions regarding patient motion. Common bile duct: Diameter: 5 mm. No intrahepatic or extrahepatic biliary duct dilatation. Liver: No focal lesion identified. Liver echogenicity overall mildly increased. Portal vein is patent on color  Doppler imaging with normal direction of blood flow towards the liver. Other: None. IMPRESSION: Mild increase in liver echogenicity may represent a degree of underlying hepatic steatosis. No focal liver lesions evident. No gallbladder pathology evident. Note that only supine imaging could be achieved due to limitations with respect patient motion for repositioning. Electronically Signed   By: Bretta Bang III M.D.   On: 01/14/2020 11:05    Labs:  CBC: Recent Labs    01/28/20 0818 01/30/20 0259 02/01/20 0107 02/03/20 0131  WBC 4.9 4.9 5.3 4.8  HGB 13.4 12.0 13.2 13.9  HCT 40.3 36.3 39.3 40.2  PLT 336 280 302 278    COAGS: Recent Labs    01/06/20 0610 01/28/20 0516  INR 1.1 1.0  APTT 32  --     BMP: Recent Labs    01/17/20 0503 01/17/20 0503 01/18/20 0318 01/18/20 0318 01/19/20 0348 01/19/20 0348 01/25/20 0308 01/28/20 0818 01/30/20 0259 02/01/20 0107 02/03/20 0131 02/07/20 0139  NA 139   < > 140   < > 137   < > 138   < > 137 135 136 137  K 3.8   < > 4.2   < > 3.8   < > 3.3*   < > 3.5 3.5 3.9 3.7  CL 102   < > 105   < > 102   < > 103   < > 104 101 102 101  CO2 26   < > 27   < > 26   < > 28   < > 24 25 28 27   GLUCOSE 146*   < > 178*   < > 220*   < > 136*   < > 159* 125* 148* 138*  BUN 12   < > 9   < > 11   < > 11   < > 19 8 12 16   CALCIUM 8.7*   < > 8.9   < > 9.0   < > 8.7*   < > 8.6* 8.8* 9.2 9.5  CREATININE 0.67   < > 0.72   < > 0.71   < > 0.62   < > 0.55 0.61 0.53 0.64  GFRNONAA >60   < > >60   < > >60   < > >60   < > >60 >60 >60 >60  GFRAA >60  --  >60  --  >60  --  >60  --   --   --   --   --    < > = values in this interval not displayed.    LIVER FUNCTION TESTS: Recent Labs    01/28/20 0818 01/30/20 0259 02/01/20 0107 02/03/20 0131  BILITOT 0.5 0.2* 0.7 0.6  AST 103* 71* 80* 80*  ALT 213* 141* 123* 121*  ALKPHOS 78 68 73 86  PROT 8.9* 7.8 7.7 7.6  ALBUMIN 3.3* 2.9* 3.1* 3.1*    TUMOR MARKERS: No results for input(s): AFPTM, CEA, CA199,  CHROMGRNA in  the last 8760 hours.  Assessment and Plan:  34 y/o F with persistent encephalopathy and dysphagia s/p surgically placed g-tube 10/15 which has become dislodged and is now in SQ tissue. IR has been asked to place a new g-tube for ongoing enteral nutrition.  Patient history and imaging reviewed by Dr. Elby Showers and Dr. Loreta Ave who agree to procedure.  Will tentatively plan for placement tomorrow (10/21) in IR. Patient to be NPO/hold tube feedings after midnight, AM labs pending, no current anticoagulation per chart. Recommend continuation of abdominal binder and soft restraints while g-tube heals if patient continues to try to pull at g-tube.  Risks and benefits discussed with the patient's aunt Anita Davis via phone including, but not limited to the need for a barium enema during the procedure, bleeding, infection, peritonitis, or damage to adjacent structures.  All of the patient's questions were answered, patient is agreeable to proceed.  Consent signed and in chart.  Thank you for this interesting consult.  I greatly enjoyed meeting Matilyn Fehrman and look forward to participating in their care.  A copy of this report was sent to the requesting provider on this date.  Electronically Signed: Villa Herb, PA-C 02/10/2020, 3:05 PM   I spent a total of 40 Minutes  in face to face in clinical consultation, greater than 50% of which was counseling/coordinating care for g-tube placement.

## 2020-02-10 NOTE — Progress Notes (Addendum)
Nutrition Follow-up  DOCUMENTATION CODES:   Not applicable  INTERVENTION:  Once G-tube is ready for use, continue: -Osmolite 1.2 cal @ 84ml/hr -32ml Prosource TF daily -free water per MD, currently Q6H  Provides1912 kcals, 97 grams of protein, and free water(2063ml total free water with flushes)   NUTRITION DIAGNOSIS:   Inadequate oral intake related to lethargy/confusion as evidenced by meal completion < 25%.  ongoing  GOAL:   Patient will meet greater than or equal to 90% of their needs  Being addressed via TF  MONITOR:   TF tolerance, Labs, Weight trends, I & O's  REASON FOR ASSESSMENT:   Consult Enteral/tube feeding initiation and management  ASSESSMENT:   Pt with refractory seizures with acute metabolic encephalopathy. PMH includes hepatitis and polysubstance abuse.  9/24 Cortrak placed (gastric) 10/10 Cortrak dislodged, TF held 10/11 Cortrak advanced (tip in proximal duodenum per KUB), TF restarted  10/14 TF reduced to 53ml/hr due to colonic ileus 10/15 G-tube placement 10/16 TF resumed 10/18 TF rate returned to 47ml/hr; G-tube pulled out 1 inch so TF held  Per RN, pt's G-tube was found to be poorly positioned and not suitable for feeding. Pt to have this adjusted today.  Of note, on 10/14, family member informed staff that pt was going to a methadone clinic in [facility] and confirmed that pt was on a 60mg  dose. 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 Since then, pt's mentation has been waxing and waning. Pt was noted to be very alert and interactive on Friday when her son was visiting, but has since returned to being unresponsive to most providers. This has been somewhat improving over the last 48 hours and pt now providing short responses to staff questions. Pt with flat affect. Neurology suspects this is due to a combination of neurological issues as  well as psychiatric -- largest concern is that pt has very severe depression contributing to symptoms. Additionally, MD noted that pt is far enough out from any potential withdrawal symptoms that it is doubtful that the lack of methadone is causing her current symptomology. Note that pt was nonverbal and unresponsive to this RD's voice today.  Despite pt's diet being advanced to regular, her mentation has prevented her from consuming enough po to meet her needs (pt has consumed 0% of last 8 recorded meals), so pt continues to receive TF. Pt's recommended TF orders are for Osmolite 1.2 cal @ 7ml/hr with 72ml Prosource TF daily and free water per MD (currently 48m Q6H). This regimen provides1912 kcals, 97 grams of protein, and free water(2018ml total free water with flushes). This should be resumed once G-tube is ready for use again. Will monitor pt's mentation and po intake and make recommendations for TF adjustments as necessary.   Of note, pt has not had a BM documented since 10/15. Consider use of enema after pt returns from IR. Discussed with NP.  Admit wt: 74.8 kg Current wt: 76.1 kg Non-pitting generalized edema noted  Labs: CBGs 122-131 Medicatoins: SS novolog, miralax, 40 mEq KCl daily, B6 injection daily, senokot, thiamine injection daily, IV pepcid IVF: D5 and NS w/ 11/15 KCl @ 36ml/hr   Diet Order:   Diet Order            Diet NPO time specified  Diet effective now                 EDUCATION NEEDS:   No education needs have been  identified at this time  Skin:  Skin Assessment: Skin Integrity Issues: Skin Integrity Issues:: Incisions Incisions: abdomen  Last BM:  10/15 type 5  Height:   Ht Readings from Last 1 Encounters:  01/06/20 5\' 5"  (1.651 m)    Weight:   Wt Readings from Last 1 Encounters:  02/10/20 76.1 kg   BMI:  Body mass index is 27.92 kg/m.  Estimated Nutritional Needs:   Kcal:  1800-2000  Protein:  90-100 grams  Fluid:   >/=1.8L/d    02/12/20, MS, RD, LDN RD pager number and weekend/on-call pager number located in [place].

## 2020-02-10 NOTE — Progress Notes (Signed)
SLP Cancellation Note  [name] MRN: [id number] DOB: [birth year]   Cancelled treatment:        Attempt to see this morning with diet upgrade however NPO for PEG (repair?). Will plan to see tomorrow.    Royce Macadamia 02/10/2020, 1:12 PM   Breck Coons Lonell Face.Ed Nurse, children's [phone] Office [phone]

## 2020-02-10 NOTE — Progress Notes (Addendum)
[facility] PROGRESS NOTE  Anita Davis YIF:[id number] DOB: [birth year] DOA: 01/04/2020 PCP: [name], PA-C     10/20 today Anita Davis is once again nonverbal and not making eye contact or following simple commands  Status: Inpatient-Remains inpatient appropriate because:Altered mental status, Ongoing diagnostic testing needed not appropriate for outpatient work up and Unsafe d/c plan -plan for PEG tube placement on 10/5   Dispo: The patient is from: Home              Anticipated d/c is to: SNF              Anticipated d/c date is: > 3 days              Patient currently is not medically stable to d/c.  Due to UNSAFE DC PLAN-patient needs placement in skilled nursing facility based on altered mentation and inability to manage self-care.  She currently requires 24/7 care due to delay from presumptive diagnosis of autoimmune encephalitis.  She is currently bedbound and awaiting PEG tube placement patient does have a dysphagia 1 diet ordered but given her variable mentation she is inconsistently and unsafely able to manage oral intake.  Over the past 24 hours she has developed improving mentation that is waxing and waning and appears to indicate that patient may be eligible for inpatient rehabilitative services as opposed to SNF placement.  Patient is self-pay financial counseling aware that patient needs work-up for Medicaid and disability.  She may also need to have a family member established as her legal guardian.  10/4 TOC spoke with patient's aunt [name] and updated her on plans to have financial counseling review case and reach out to family regarding application for Medicaid, disability and guardianship is necessary.  [name] says she is currently guardian for another family member with TBI and is uncertain if she could manage to guardianship but will discuss with other members of the family.  Ending on 10/15 patient began having more consistent episodes of wakefulness. She  actually awakened and became talkative while in the preop holding area and remained awake and interactive with staff and family throughout the evening. Since that time she has had some episodes of complete lethargy but for the past 48 hours as of 10/19 she is awake and following commands although has extremely flat affect and is very slow to respond. Psych consult requested but given patient's waxing and waning mentation and limited verbal response when awake do not suspect at this point she would benefit from this. On Friday 10/15 patient was eager to speak to psychiatric team but when I discussed this with her again on 10/19 she stated "no"   Code Status: Full Family Communication: Updated [name] "[address] [name] 10/8-son and family friend at bedside on 10/15 DVT prophylaxis: Lovenox Vaccination status: Has not received Covid vaccine.  HPI: 34 y.o. F with hx substance use disorder, active, hepatitis C, and gestational diabetes who presented with confusion.  Patient initially admitted in September with encephalopathy, thought to be from drug overdose.  LP showed no signs of infection. MRI brain unremarkable. She remained encephalopathic, and EEG showed focal seizures.  The seizures were suppressed, and she was transferred to [facility] for continuous EEG, but after seizure control, she had poor neurologic recovery.  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  She deteriorated after steroids were stopped, so IVIG was started.  Subjective: Eyes are open but not tracking or making eye  contact or attempting to verbally communicate or follow simple commands.  Objective: Vitals:   02/10/20 0721 02/10/20 1108  BP: 115/63 119/89  Pulse: 94 87  Resp: 16 16  Temp: 99.2 F (37.3 C) 97.9 F (36.6 C)  SpO2: 99% 99%    Intake/Output Summary (Last 24 hours) at 02/10/2020 1259 Last data filed at 02/10/2020 0631 Gross per  24 hour  Intake --  Output 350 ml  Net -350 ml   Filed Weights   02/07/20 0500 02/09/20 0500 02/10/20 0500  Weight: 74.9 kg 64.2 kg 76.1 kg    Exam:  Constitutional: NAD, awake, very slow to respond Respiratory: clear to auscultation bilaterally, Normal respiratory effort. No accessory muscle use.  Room air Cardiovascular: Regular rate and rhythm, no murmurs / rubs / gallops. No extremity edema. 2+ pedal pulses.   Abdomen: no tenderness patient, no masses palpated.  Does appear to be slightly distended or bloated.  Bowel sounds positive but tingling primarily in the right mid quadrant.  G-tube in place but apparently became dislodged overnight so currently not being utilized until after x-ray studies Musculoskeletal: no clubbing / cyanosis. No joint deformity upper and lower extremities.no contractures.  No tremors or spasticity  Skin: no rashes, lesions, ulcers. No induration Neurologic: CN 2-12 grossly intact, weakly moves all extremities spontaneously, FSC although very slow to respond. With coaching she was finally able to understand that I requested that she pulled back on my hands as if she were trying to pull me into the bed. At that time strength in upper extremities noted to be around 2/5. Sensation intact.  No focal neurological deficits appreciated. 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   -The specific etiology remains unclear, but the differential for this appears to be an autoimmune encephalitis, or post status epilepticus encephalopathy.  TSH normal.  Neuraxial imaging unremarkable.  CT C/A/P shows no signs of cancer, paraneoplastic encephalitis seems unlikely.  The clinical picture is not consistent with a true psychiatric mediated catatonia, nor an  opiate/alcohol withdrawal syndrome at this point (see below regarding psychiatry recommendations).    -Completed IVIG 10/1. Upon review of neurology notes they have documented that it may take several weeks before full effects/potential improvement from IVIG seen.  Will need reevaluation with neurologist 6 weeks from 01/22/2020 to determine if any significant improvements or whether this will be patient's baseline mentation. -of note, autoimmune studies performed on CSF fluid and apparently are still at the [facility] lab -Continue phenobarbital, Vimpat, and Dilantin-Dilantin level 2.9 on 10/6 therefore Dilantin changed from 100 mg TID to 200 mg BID; neuro recommends gradual down titration and subsequent discontinuation of AEDs after discharge if remains seizure-free-had recent EEG without any recurrent seizure activity -consider repeat EEG and MRI in 6 to 8 weeks after discharge -Evaluated by psychiatry on 9/29-they added low-dose Seroquel but no improvement in symptoms therefore this was discontinued. -Xanax titrated and discontinued on 10/14 -Continue empiric thiamine and vitamin B6 replacement -Continues to have waxing and waning mentation but over the past 48 hours has had more wakeful episodes with severely flat affect, follows commands but is slow to respond and has been able to participate somewhat with PT  Suspected chronic depression in context of ongoing polysubstance abuse prior to admission -History obtained from family that patient has been demonstrating symptoms of depression for  several months that had worsened just prior to presentation -Given waxing and waning mental status suspect a combination of recent neurological insult as well as severe depression contributing to mentation issues  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  Surgical team notified last night on 10/19 and states will see patient today on 10/20 and recommended we contact IR -Consult placed in epic for IR to evaluate G tube-IR team ordered a CT abdomen completed that demonstrates G-tube catheter has been pulled back and tip is in subcutaneous fat of the anterior abdominal wall anterior to the rectus sheath. -Continue IV fluids for now -Given patient's waxing and waning mentation despite doing well and passing SLP evaluation with recommendation for regular diet and thin liquids would keep gastrostomy tube in place to allow for route for oral medications as well as nutritional supplementation when she is unable to eat oral diet  Acute Urinary retention/E. coli UTI -10/5 bladder scan w/ > 700 cc so I/O cath ordered -cont q shift bladder scans -Urine culture positive for pansensitive E. Coli -Has completed 3 days of IV Rocephin  Mild hypokalemia -Potassium 3.9 on 10/13 -Continue per tube replacement of potassium -Follow labs PRN  Ileus probably due to constipation -Symptoms have resolved and patient tolerating full rate tube feeding at this juncture and having regular bowel movements  Nonobstructive transaminitis/hepatitis C -Elevated HCV antibody with markedly elevated HCV RNA quantitative level  consistent with chronic hepatitis C   Nutrition Status/moderate to severe protein calorie malnutrition: Nutrition Problem: Inadequate oral intake Etiology: lethargy/confusion Signs/Symptoms: meal completion < 25% Interventions: Tube feeding, Prostat Estimated body mass index is 27.92 kg/m as calculated from the following:   Height as of this encounter: 5\' 5"  (1.651 m).   Weight as of this encounter: 76.1 kg.  Yeast vaginitis -Completed intravaginal Monistat    Data Reviewed: Basic Metabolic Panel: Recent Labs  Lab 02/07/20 0139  NA 137  K 3.7  CL 101  CO2 27  GLUCOSE 138*  BUN  16  CREATININE 0.64  CALCIUM 9.5   Liver Function Tests: No results for input(s): AST, ALT, ALKPHOS, BILITOT, PROT, ALBUMIN in the last 168 hours. No results for input(s): LIPASE, AMYLASE in the last 168 hours. No results for input(s): AMMONIA in the last 168 hours. CBC: No results for input(s): WBC, NEUTROABS, HGB, HCT, MCV, PLT in the last 168 hours. Cardiac Enzymes: No results for input(s): CKTOTAL, CKMB, CKMBINDEX, TROPONINI in the last 168 hours. BNP (last 3 results) No results for input(s): BNP in the last 8760 hours.  ProBNP (last 3 results) No results for input(s): PROBNP in the last 8760 hours.  CBG: Recent Labs  Lab 02/09/20 1936 02/09/20 2313 02/10/20 0415 02/10/20 0730 02/10/20 1112  GLUCAP 78 128* 122* 131* 107*    Recent Results (from the past 240 hour(s))  MRSA PCR Screening     Status: Abnormal   Collection Time: 02/08/20  8:28 AM   Specimen: Nasal Mucosa; Nasopharyngeal  Result Value Ref Range Status   MRSA by PCR POSITIVE (A) NEGATIVE Final    Comment:        The GeneXpert MRSA Assay (FDA approved for NASAL specimens only), is one component of a comprehensive MRSA colonization surveillance program. It is not intended to diagnose MRSA infection nor to guide or monitor treatment for  MRSA infections. RESULT CALLED TO, READ BACK BY AND VERIFIED WITH: Dimas AguasZ. Lievano RN 10:35 02/08/20 ([email or web address]) Performed at [facility], [address] [place], [place] [address]      Studies: CT ABDOMEN WO CONTRAST  Result Date: 02/10/2020 CLINICAL DATA:  Gastrostomy mild function. EXAM: CT ABDOMEN WITHOUT CONTRAST TECHNIQUE: Multidetector CT imaging of the abdomen was performed following the standard protocol without IV contrast. COMPARISON:  01/18/2020 FINDINGS: Lower chest: The visualized lung bases are clear bilaterally. The visualized heart and pericardium are unremarkable. Hepatobiliary: No focal liver abnormality is seen. No gallstones, gallbladder wall  thickening, or biliary dilatation. Pancreas: Unremarkable Spleen: Unremarkable Adrenals/Urinary Tract: The adrenal glands are unremarkable. The kidneys are normal in size and position. Punctate 1-2 mm nonobstructing calculi are seen within the interpolar region of the left kidney and lower pole of the kidneys bilaterally. No hydronephrosis. No significant perinephric stranding or perinephric fluid collections. Stomach/Bowel: Since the prior examination, a balloon retention percutaneous gastrostomy has been placed, however, this appears to have been withdrawn and its tip is now seen within the subcutaneous soft tissues of the left upper quadrant of the abdomen superficial to the rectus sheath. There is thickening of the rectus sheath as well as intramuscular hyperdensity in keeping with intramuscular hemorrhage, likely related to recent surgical procedure. The stomach appears a fixed to the anterior peritoneum subjacent to the rectus sheath in this location suggesting gastropexy. There is no free intraperitoneal gas or fluid within the upper abdomen. Subcutaneous gas within the anterior abdominal wall bilaterally likely relates to recent surgical gastrostomy. No discrete drainable fluid collection identified within the subcutaneous soft tissues. Vascular/Lymphatic: No significant vascular findings are present. No enlarged abdominal or pelvic lymph nodes. Other: Tiny broad-based fat containing umbilical hernia. Nodular infiltration within the subcutaneous fat of the visualized lower quadrant anterior abdominal wall likely relates to subcutaneous injection. Musculoskeletal: No acute bone abnormality. IMPRESSION: Recently placed gastrostomy catheter has been withdrawn into the subcutaneous fat of the anterior abdominal wall anterior to the rectus sheath. No associated discrete drainable fluid collection. Thickening of the rectus sheath likely related to intramuscular hemorrhage related to recent surgical procedure.  Stomach remains fixed to the anterior peritoneal wall, likely related to underlying gastropexy, and no free intraperitoneal gas is identified. These results will be called to the ordering clinician or representative by the Radiologist Assistant, and communication documented in the PACS or [employer] Dashboard. Electronically Signed   By: Helyn NumbersAshesh  Parikh MD   On: 02/10/2020 10:40   DG ABDOMEN PEG TUBE LOCATION  Result Date: 02/09/2020 CLINICAL DATA:  Peg tube placement EXAM: ABDOMEN - 1 VIEW COMPARISON:  02/03/2020 FINDINGS: Single view of the abdomen following injection of Omnipaque 300 through patient's PEG tube. Amorphous collection of contrast around the PEG tube entry site. No definite intraluminal contrast is visible. The gas pattern is unobstructed. IMPRESSION: Intragastric location of gastrostomy tube is not confirmed on this study. Amorphous contrast collection about the tube entry site probably represents soft tissue extravasation of contrast. More precise positioning of tube could be ascertained with CT or tube injection under direct fluoroscopic observation. These results will be called to the ordering clinician or representative by the Radiologist Assistant, and communication documented in the PACS or [employer] Dashboard. Electronically Signed   By: Jasmine PangKim  Fujinaga M.D.   On: 02/09/2020 19:27    Scheduled Meds: . amantadine  100 mg Per Tube BID  . Chlorhexidine Gluconate Cloth  6 each Topical Daily  . free water  200  mL Per Tube Q6H  . insulin aspart  0-9 Units Subcutaneous TID WC  . PHENObarbital  65 mg Intravenous BID  . polyethylene glycol  17 g Per Tube Daily  . potassium chloride  40 mEq Per Tube Daily  . pyridOXINE  100 mg Intravenous Daily  . senna  1 tablet Per Tube QHS  . thiamine injection  100 mg Intravenous Q24H   Continuous Infusions: . dextrose 5 % and 0.9 % NaCl with KCl 20 mEq/L 50 mL/hr at 02/09/20 2055  . famotidine (PEPCID) IV 20 mg (02/10/20 1253)  . feeding supplement  (OSMOLITE 1.2 CAL) 1,000 mL (02/07/20 1534)  . lacosamide (VIMPAT) IV Stopped (02/10/20 1238)  . phenytoin (DILANTIN) IV 125 mg (02/10/20 0629)    Principal Problem:   Refractory seizure (HCC) Active Problems:   Acute metabolic encephalopathy   Hypokalemia   Polysubstance abuse (HCC)   Elevated CK   Transaminitis   Chronic hepatitis C without hepatic coma (HCC)   Distended abdomen   Palliative care encounter   Colonic Ileus [facility])   Consultants:  Neurology  Psychiatry  Interventional radiology  Surgery  Procedures:  9/14 lumbar puncture  9/15 EEG  9/16 EEG  9/17 EEG  9/19 overnight EEG with video  9/22 overnight EEG with video with discontinuation of long-term EEG monitoring on 9/25  9/24 core track placement  10/6 EEG  Antibiotics: Anti-infectives (From admission, onward)   Start     Dose/Rate Route Frequency Ordered Stop   02/05/20 0600  ceFAZolin (ANCEF) IVPB 2g/100 mL premix        2 g 200 mL/hr over 30 Minutes Intravenous To Short Stay 02/04/20 1054 02/05/20 0950   01/29/20 0600  ceFAZolin (ANCEF) IVPB 2g/100 mL premix        2 g 200 mL/hr over 30 Minutes Intravenous On call to O.R. 01/28/20 1511 01/30/20 0559   01/28/20 2000  cefTRIAXone (ROCEPHIN) 1 g in sodium chloride 0.9 % 100 mL IVPB        1 g 200 mL/hr over 30 Minutes Intravenous Every 24 hours 01/28/20 1925 02/02/20 0724   01/06/20 0900  cefTRIAXone (ROCEPHIN) 2 g in sodium chloride 0.9 % 100 mL IVPB  Status:  Discontinued        2 g 200 mL/hr over 30 Minutes Intravenous Every 12 hours 01/06/20 0105 01/06/20 2202   01/06/20 0800  vancomycin (VANCOCIN) IVPB 1000 mg/200 mL premix  Status:  Discontinued       "Followed by" Linked Group Details   1,000 mg 200 mL/hr over 60 Minutes Intravenous Every 12 hours 01/05/20 1904 01/06/20 2202   01/05/20 2000  vancomycin (VANCOREADY) IVPB 1500 mg/300 mL       "Followed by" Linked Group Details   1,500 mg 150 mL/hr over 120 Minutes Intravenous  Once  01/05/20 1904 01/06/20 0127   01/05/20 1830  cefTRIAXone (ROCEPHIN) 2 g in sodium chloride 0.9 % 100 mL IVPB        2 g 200 mL/hr over 30 Minutes Intravenous  Once 01/05/20 1829 01/05/20 2220       Time spent: 25    [name] ANP  [facility] Pager [phone]. If 7PM-7AM, please contact night-coverage at [email or web address] 02/10/2020, 12:59 PM  LOS: 35 days

## 2020-02-10 NOTE — Progress Notes (Signed)
Occupational Therapy Treatment [name] MRN: [id number] DOB: 10/26/1985 Today's Date: 02/10/2020    History of present illness 34 year old with past medical history significant for hepatitis C, polysubstance abuse brought to the hospital 9/13 for disorientation which was suspected to be substance abuse.  After she became more lucid, she was discharged, but then police brought her back later in the day and they found her passed out in a parking lot.  Urine drug screen was positive for benzodiazepine and THC. An EEG done on 9/15 showed patient was in status epilepticus.  Patient was a started on Keppra.  Despite these, EEG noted continued seizures requiring additional medications as well.  Finally on 918, seizures broke. Follow-up EEG on 9/20 noted evidence of epileptic [name] from the left central temporal region.  Repeat EEG done on 9/22 noted epileptogenic city from the left central temporal region.  Pt with PEG placed on 02/05/20.   OT comments  Pt completed bed to chair transfer with increased pain noted. Pt with noted bleeding at abdomen through abdominal binder. RN called and addressing. Pt returned to supine to help with positioning and help with comfort level. Pt initiated task this session but limited by pain/ internal distraction.    Follow Up Recommendations  SNF    Equipment Recommendations  3 in 1 bedside commode;Wheelchair (measurements OT);Wheelchair cushion (measurements OT);Hospital bed    Recommendations for Other Services      Precautions / Restrictions Precautions Precautions: Fall       Mobility Bed Mobility Overal bed mobility: Needs Assistance Bed Mobility: Rolling;Supine to Sit;Sit to Supine Rolling: Max assist   Supine to sit: Mod assist Sit to supine: Min assist   General bed mobility comments: Pt needs max cues to progress to EOB with HOB elevated 45 degrees. Pt static sitting min (A) pt motivated to return to supine and  initiates. pt requires (A) to roll R and L for linen change with RN  Transfers Overall transfer level: Needs assistance Equipment used: 1 person hand held assist Transfers: Stand Pivot Transfers Sit to Stand: Mod assist         General transfer comment: pt needs (A) to power up from bed and then chair. pt once in chair very restless uncomfortable. Pt returned to supine in bed due to restless behavior     Balance Overall balance assessment: Needs assistance Sitting-balance support: Bilateral upper extremity supported;Feet supported Sitting balance-Leahy Scale: Fair Sitting balance - Comments: requires BIL UE to balance at EOB                                   ADL either performed or assessed with clinical judgement   ADL Overall ADL's : Needs assistance/impaired Eating/Feeding: NPO   Grooming: Maximal assistance                   Toilet Transfer: Moderate assistance;Stand-pivot Toilet Transfer Details (indicate cue type and reason): simulated OOB to [facility]           General ADL Comments: pt total (A) for all adls. pt completed transfer from bed to chair and then chair to bed Pt with visual drainage on gown small bleeding noted RN in room to address site. Blood noted on abdominal binder     Vision       Perception     Praxis      Cognition Arousal/Alertness: Awake/alert Behavior During Therapy:  Restless Overall Cognitive Status: Impaired/Different from baseline Area of Impairment: Following commands;Attention                   Current Attention Level: Focused   Following Commands: Follows one step commands inconsistently;Follows one step commands with increased time       General Comments: pt followed simple command during session to progress to EOB.         Exercises     Shoulder Instructions       General Comments crying and laying in a fetal like position supine at the end of session. RN in room with patient to address  needs    Pertinent Vitals/ Pain       Pain Assessment: Faces Faces Pain Scale: Hurts whole lot Pain Location: stomach Pain Descriptors / Indicators: Crying;Discomfort;Grimacing Pain Intervention(s): Monitored during session;Repositioned;Other (comment) (RN called to room)  Home Living                                          Prior Functioning/Environment              Frequency  Min 2X/week        Progress Toward Goals  OT Goals(current goals can now be found in the care plan section)  Progress towards OT goals: Progressing toward goals  Acute Rehab OT Goals Patient Stated Goal: unable to state OT Goal Formulation: Patient unable to participate in goal setting Time For Goal Achievement: 02/24/20 Potential to Achieve Goals: Fair ADL Goals Pt Will Perform Eating: with mod assist;with adaptive utensils;bed level Pt Will Perform Grooming: with mod assist;sitting Additional ADL Goal #1: pt will follow 50% of commands during session  Plan Discharge plan remains appropriate    Co-evaluation                 AM-PAC OT "6 Clicks" Daily Activity     Outcome Measure   Help from another person eating meals?: Total Help from another person taking care of personal grooming?: Total Help from another person toileting, which includes using toliet, bedpan, or urinal?: Total Help from another person bathing (including washing, rinsing, drying)?: Total Help from another person to put on and taking off regular upper body clothing?: Total Help from another person to put on and taking off regular lower body clothing?: Total 6 Click Score: 6    End of Session Equipment Utilized During Treatment: Other (comment) (abdominal binder on pt on arrival)  OT Visit Diagnosis: Unsteadiness on feet (R26.81);Cognitive communication deficit (R41.841)   Activity Tolerance Patient limited by pain   Patient Left in bed;with call bell/phone within reach;with bed alarm  set;with nursing/sitter in room   Nurse Communication Mobility status;Precautions        Time: 7106-2694 OT Time Calculation (min): 35 min  Charges: OT General Charges $OT Visit: 1 Visit OT Treatments $Self Care/Home Management : 23-37 mins   Brynn, OTR/L  Acute Rehabilitation Services Pager: [phone] Office: [phone] .    Mateo Flow 02/10/2020, 3:32 PM

## 2020-02-11 ENCOUNTER — Inpatient Hospital Stay (HOSPITAL_COMMUNITY): Payer: Medicaid Other

## 2020-02-11 HISTORY — PX: IR REPLC GASTRO/COLONIC TUBE PERCUT W/FLUORO: IMG2333

## 2020-02-11 LAB — GLUCOSE, CAPILLARY
Glucose-Capillary: 105 mg/dL — ABNORMAL HIGH (ref 70–99)
Glucose-Capillary: 107 mg/dL — ABNORMAL HIGH (ref 70–99)
Glucose-Capillary: 109 mg/dL — ABNORMAL HIGH (ref 70–99)
Glucose-Capillary: 123 mg/dL — ABNORMAL HIGH (ref 70–99)
Glucose-Capillary: 126 mg/dL — ABNORMAL HIGH (ref 70–99)
Glucose-Capillary: 131 mg/dL — ABNORMAL HIGH (ref 70–99)

## 2020-02-11 LAB — PROTIME-INR
INR: 1 (ref 0.8–1.2)
Prothrombin Time: 12.8 seconds (ref 11.4–15.2)

## 2020-02-11 MED ORDER — FENTANYL CITRATE (PF) 100 MCG/2ML IJ SOLN
INTRAMUSCULAR | Status: AC
Start: 2020-02-11 — End: 2020-02-12
  Filled 2020-02-11: qty 2

## 2020-02-11 MED ORDER — MIDAZOLAM HCL 2 MG/2ML IJ SOLN
INTRAMUSCULAR | Status: AC
  Filled 2020-02-11: qty 2

## 2020-02-11 MED ORDER — LIDOCAINE HCL (PF) 1 % IJ SOLN
INTRAMUSCULAR | Status: AC
  Filled 2020-02-11: qty 30

## 2020-02-11 MED ORDER — CEFAZOLIN SODIUM-DEXTROSE 2-4 GM/100ML-% IV SOLN
2.0000 g | Freq: Once | INTRAVENOUS | Status: AC
  Administered 2020-02-11: 2 g via INTRAVENOUS
  Filled 2020-02-11: qty 100

## 2020-02-11 MED ORDER — MORPHINE SULFATE (PF) 2 MG/ML IV SOLN
1.0000 mg | INTRAVENOUS | Status: DC | PRN
  Administered 2020-02-14 – 2020-02-16 (×5): 1 mg via INTRAVENOUS
  Filled 2020-02-11 (×5): qty 1

## 2020-02-11 MED ORDER — LIDOCAINE VISCOUS HCL 2 % MT SOLN
OROMUCOSAL | Status: AC
  Administered 2020-02-11: 10 mL via TOPICAL
  Filled 2020-02-11: qty 15

## 2020-02-11 MED ORDER — FENTANYL CITRATE (PF) 100 MCG/2ML IJ SOLN
INTRAMUSCULAR | Status: AC | PRN
Start: 2020-02-11 — End: 2020-02-11
  Administered 2020-02-11: 50 ug via INTRAVENOUS

## 2020-02-11 MED ORDER — IOHEXOL 300 MG/ML  SOLN
50.0000 mL | Freq: Once | INTRAMUSCULAR | Status: AC | PRN
  Administered 2020-02-11: 20 mL

## 2020-02-11 MED ORDER — MIDAZOLAM HCL 2 MG/2ML IJ SOLN
INTRAMUSCULAR | Status: AC | PRN
  Administered 2020-02-11: 0.5 mg via INTRAVENOUS
  Administered 2020-02-11: 1 mg via INTRAVENOUS
  Administered 2020-02-11: 0.5 mg via INTRAVENOUS

## 2020-02-11 NOTE — Progress Notes (Signed)
[facility] PROGRESS NOTE  Anita Davis HCW:[id number] DOB: [birth year] DOA: 01/04/2020 PCP: Anita Hawking, PA-C     10/20 Anita Davis is once again nonverbal and not making eye contact or following simple commands  Status: Inpatient-Remains inpatient appropriate because:Altered mental status, Ongoing diagnostic testing needed not appropriate for outpatient work up and Unsafe d/c plan -plan for PEG tube placement on 10/5   Dispo: The patient is from: Home              Anticipated d/c is to: SNF              Anticipated d/c date is: > 3 days              Patient currently is not medically stable to d/c.  Due to UNSAFE DC PLAN-patient needs placement in skilled nursing facility based on altered mentation and inability to manage self-care.  She currently requires 24/7 care due to delay from presumptive diagnosis of autoimmune encephalitis.  She is currently bedbound and awaiting PEG tube placement patient does have a dysphagia 1 diet ordered but given her variable mentation she is inconsistently and unsafely able to manage oral intake.  Over the past 24 hours she has developed improving mentation that is waxing and waning and appears to indicate that patient may be eligible for inpatient rehabilitative services as opposed to SNF placement.  Patient is self-pay financial counseling aware that patient needs work-up for Medicaid and disability.  She may also need to have a family member established as her legal guardian.  10/4 TOC spoke with patient's aunt Anita Davis and updated her on plans to have financial counseling review case and reach out to family regarding application for Medicaid, disability and guardianship is necessary.  Anita Davis says she is currently guardian for another family member with TBI and is uncertain if she could manage to guardianship but will discuss with other members of the family.  Ending on 10/15 patient began having more consistent episodes of wakefulness. She  actually awakened and became talkative while in the preop holding area and remained awake and interactive with staff and family throughout the evening. Since that time she has had some episodes of complete lethargy but for the past 48 hours as of 10/19 she is awake and following commands although has extremely flat affect and is very slow to respond. Psych consult requested but given patient's waxing and waning mentation and limited verbal response when awake do not suspect at this point she would benefit from this. On Friday 10/15 patient was eager to speak to psychiatric team but when I discussed this with her again on 10/19 she stated "no"   Code Status: Full Family Communication: Updated Anita Davis "[address]" Anita Davis 10/8-son and family friend at bedside on 10/15 DVT prophylaxis: Lovenox Vaccination status: Has not received Covid vaccine.  HPI: 34 y.o. F with hx substance use disorder, active, hepatitis C, and gestational diabetes who presented with confusion.  Patient initially admitted in September with encephalopathy, thought to be from drug overdose.  LP showed no signs of infection. MRI brain unremarkable. She remained encephalopathic, and EEG showed focal seizures.  The seizures were suppressed, and she was transferred to [facility] for continuous EEG, but after seizure control, she had poor neurologic recovery.  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  She deteriorated after steroids were stopped, so IVIG was started.  Subjective: Patient's eyes are open and she is tracking during conversation  and making purposeful movements but is not following commands. At one point during my interaction with her she smiled briefly but the smile was not appropriate to the conversation at hand  Reviewed incidents from overnight. Patient became very agitated and planing of abdominal pain and need to urinate. It appears Foley  catheter had become obstructed noting catheter was removed and patient subsequently required in and out catheterization of 700 cc of urine. Also had developed bleeding from the PEG site.  Objective: Vitals:   02/11/20 0820 02/11/20 1121  BP: 122/76 118/86  Pulse: 86 75  Resp: 18 16  Temp: 98.5 F (36.9 C) 98.6 F (37 C)  SpO2: 99% 98%    Intake/Output Summary (Last 24 hours) at 02/11/2020 1316 Last data filed at 02/11/2020 0200 Gross per 24 hour  Intake 1195.88 ml  Output 700 ml  Net 495.88 ml   Filed Weights   02/09/20 0500 02/10/20 0500 02/11/20 0402  Weight: 64.2 kg 76.1 kg 74.7 kg    Exam:  Constitutional: NAD, awake, smiles inappropriately during conversation, not appear to be in any pain this time Respiratory: clear to auscultation bilaterally, Normal respiratory effort. No accessory muscle use.  Room air Cardiovascular: Regular rate and rhythm, no murmurs / rubs / gallops. No extremity edema. 2+ pedal pulses.   Abdomen: no tenderness patient, no masses palpated.  Does appear to be slightly distended or bloated.  Bowel sounds positive but tingling primarily in the right mid quadrant.  G-tube covered by abdominal binding. Apparently had bleeding overnight. Genitourinary: Foley catheter removed overnight and patient continues to void without difficulty Musculoskeletal: no clubbing / cyanosis. No joint deformity upper and lower extremities.no contractures.  No tremors or spasticity  Skin: no rashes, lesions, ulcers. No induration Neurologic: CN 2-12 grossly intact, weakly moves all extremities spontaneously; previously when alert and able to [facility] she was very slow to respond. Strength upper extremities 2/5. Sensation intact.  No focal neurological deficits appreciated. 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   -The specific etiology remains unclear, but the differential for this appears to be an autoimmune encephalitis, or post status epilepticus encephalopathy.  TSH normal.  Neuraxial imaging unremarkable.  CT C/A/P shows no signs of cancer, paraneoplastic encephalitis seems unlikely.  The clinical picture is not consistent with a true psychiatric mediated catatonia, nor an opiate/alcohol withdrawal syndrome at this point (see below regarding psychiatry recommendations).    -Completed IVIG 10/1. Upon review of neurology notes they have documented that it may take several weeks before full effects/potential improvement from IVIG seen.  Will need reevaluation with neurologist 6 weeks from 01/22/2020 to determine if any significant improvements or whether this will be patient's baseline mentation. -of note, autoimmune studies performed on CSF fluid and apparently are still at the [facility] lab -Continue phenobarbital, Vimpat, and Dilantin-Dilantin level 2.9 on 10/6 therefore Dilantin changed from 100 mg TID to 200 mg BID; neuro recommends gradual down titration and subsequent discontinuation of AEDs after discharge if remains seizure-free-had recent EEG without any recurrent seizure activity -consider repeat EEG and MRI in 6 to 8 weeks after discharge -Evaluated by psychiatry on 9/29-they added low-dose Seroquel but no improvement in symptoms therefore this was discontinued. -Xanax titrated and discontinued on 10/14 -Continue empiric thiamine and vitamin B6 replacement -Continues to have waxing and waning mentation but more  episodes of wakefulness. Was able to participate with OT yesterday afternoon but became very agitated last night in regards to GT site abdominal pain and urinary retention from occluded catheter. -We will attempt to use mitts only to prevent patient from pulling at her tubing and IVs to minimize her believe that she is being held  captive.  Suspected chronic depression in context of ongoing polysubstance abuse prior to admission -History obtained from family that patient has been demonstrating symptoms of depression for several months that had worsened just prior to presentation -Given waxing and waning mental status suspect a combination of recent neurological insult as well as severe depression contributing to mentation issues  Dislodged gastrostomy tube -IR plans replacement today with recommendation to restrain extremities to prevent dislodgment in the future -We will try using mitts with safety sitter at bedside to see if this will help with patient's belief that she is being held captive; successful and patient begins pulling at tubes and lines will need to restrain at the wrists -Plan is to utilize IV morphine while n.p.o. 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  Surgical team notified last night on 10/19 and states will see patient today on 10/20 and recommended we contact IR -Consult placed in epic for IR to evaluate G tube-IR team ordered a CT abdomen completed that demonstrates G-tube catheter has been pulled back and tip is in subcutaneous fat of the anterior abdominal wall anterior to the rectus sheath. -Continue IV fluids for now -Given patient's waxing and waning mentation despite doing well and passing SLP evaluation with recommendation for regular diet and thin liquids would keep gastrostomy tube in place to allow for route for oral medications as well as nutritional supplementation when she is unable  to eat oral diet  Acute Urinary retention/E. coli UTI -Urine culture positive for pansensitive E. Coli -Has completed 3 days of IV Rocephin -Foley catheter removed overnight into 10/21 due to obstruction and patient currently voiding without difficulty  Nonobstructive transaminitis/hepatitis C -Elevated HCV antibody with markedly elevated HCV RNA quantitative level  consistent with chronic hepatitis C   Nutrition Status/moderate to severe protein calorie malnutrition: Nutrition Problem: Inadequate oral intake Etiology: lethargy/confusion Signs/Symptoms: meal completion < 25% Interventions: Tube feeding, Prostat Estimated body mass index is 27.4 kg/m as calculated from the following:   Height as of this encounter:  (1.651 m).   Weight as of this encounter: 74.7 kg.     Data Reviewed: Basic Metabolic Panel: Recent Labs  Lab 02/07/20 0139  NA 137  K 3.7  CL 101  CO2 27  GLUCOSE 138*  BUN 16  CREATININE 0.64  CALCIUM 9.5   Liver Function Tests: No results for input(s): AST, ALT, ALKPHOS, BILITOT, PROT, ALBUMIN in the last 168 hours. No results for input(s): LIPASE, AMYLASE in the last 168 hours. No results for input(s): AMMONIA in the last 168 hours. CBC: Recent Labs  Lab 02/10/20 1619  WBC 6.9  HGB 12.0  HCT 35.3*  MCV 94.1  PLT 211   Cardiac Enzymes: No results for input(s): CKTOTAL, CKMB, CKMBINDEX, TROPONINI in the last 168 hours. BNP (  last 3 results) No results for input(s): BNP in the last 8760 hours.  ProBNP (last 3 results) No results for input(s): PROBNP in the last 8760 hours.  CBG: Recent Labs  Lab 02/10/20 1935 02/10/20 2348 02/11/20 0401 02/11/20 0840 02/11/20 1120  GLUCAP 104* 134* 131* 123* 109*    Recent Results (from the past 240 hour(s))  MRSA PCR Screening     Status: Abnormal   Collection Time: 02/08/20  8:28 AM   Specimen: Nasal Mucosa; Nasopharyngeal  Result Value Ref Range Status   MRSA by PCR POSITIVE (A) NEGATIVE  Final    Comment:        The GeneXpert MRSA Assay (FDA approved for NASAL specimens only), is one component of a comprehensive MRSA colonization surveillance program. It is not intended to diagnose MRSA infection nor to guide or monitor treatment for MRSA infections. RESULT CALLED TO, READ BACK BY AND VERIFIED WITH: Dimas Aguas RN 10:35 02/08/20 ([email or web address]) Performed at [facility], [address], [place], [place] [address]      Studies: CT ABDOMEN WO CONTRAST  Result Date: 02/10/2020 CLINICAL DATA:  Gastrostomy mild function. EXAM: CT ABDOMEN WITHOUT CONTRAST TECHNIQUE: Multidetector CT imaging of the abdomen was performed following the standard protocol without IV contrast. COMPARISON:  01/18/2020 FINDINGS: Lower chest: The visualized lung bases are clear bilaterally. The visualized heart and pericardium are unremarkable. Hepatobiliary: No focal liver abnormality is seen. No gallstones, gallbladder wall thickening, or biliary dilatation. Pancreas: Unremarkable Spleen: Unremarkable Adrenals/Urinary Tract: The adrenal glands are unremarkable. The kidneys are normal in size and position. Punctate 1-2 mm nonobstructing calculi are seen within the interpolar region of the left kidney and lower pole of the kidneys bilaterally. No hydronephrosis. No significant perinephric stranding or perinephric fluid collections. Stomach/Bowel: Since the prior examination, a balloon retention percutaneous gastrostomy has been placed, however, this appears to have been withdrawn and its tip is now seen within the subcutaneous soft tissues of the left upper quadrant of the abdomen superficial to the rectus sheath. There is thickening of the rectus sheath as well as intramuscular hyperdensity in keeping with intramuscular hemorrhage, likely related to recent surgical procedure. The stomach appears a fixed to the anterior peritoneum subjacent to the rectus sheath in this location suggesting gastropexy. There is  no free intraperitoneal gas or fluid within the upper abdomen. Subcutaneous gas within the anterior abdominal wall bilaterally likely relates to recent surgical gastrostomy. No discrete drainable fluid collection identified within the subcutaneous soft tissues. Vascular/Lymphatic: No significant vascular findings are present. No enlarged abdominal or pelvic lymph nodes. Other: Tiny broad-based fat containing umbilical hernia. Nodular infiltration within the subcutaneous fat of the visualized lower quadrant anterior abdominal wall likely relates to subcutaneous injection. Musculoskeletal: No acute bone abnormality. IMPRESSION: Recently placed gastrostomy catheter has been withdrawn into the subcutaneous fat of the anterior abdominal wall anterior to the rectus sheath. No associated discrete drainable fluid collection. Thickening of the rectus sheath likely related to intramuscular hemorrhage related to recent surgical procedure. Stomach remains fixed to the anterior peritoneal wall, likely related to underlying gastropexy, and no free intraperitoneal gas is identified. These results will be called to the ordering clinician or representative by the Radiologist Assistant, and communication documented in the PACS or [employer]. Electronically Signed   By: Helyn Numbers MD   On: 02/10/2020 10:40   DG ABDOMEN PEG TUBE LOCATION  Result Date: 02/09/2020 CLINICAL DATA:  Peg tube placement EXAM: ABDOMEN - 1 VIEW COMPARISON:  02/03/2020 FINDINGS:  Single view of the abdomen following injection of Omnipaque 300 through patient's PEG tube. Amorphous collection of contrast around the PEG tube entry site. No definite intraluminal contrast is visible. The gas pattern is unobstructed. IMPRESSION: Intragastric location of gastrostomy tube is not confirmed on this study. Amorphous contrast collection about the tube entry site probably represents soft tissue extravasation of contrast. More precise positioning of tube could  be ascertained with CT or tube injection under direct fluoroscopic observation. These results will be called to the ordering clinician or representative by the Radiologist Assistant, and communication documented in the PACS or [employer]. Electronically Signed   By: Jasmine PangKim  Fujinaga M.D.   On: 02/09/2020 19:27    Scheduled Meds: . amantadine  100 mg Per Tube BID  . Chlorhexidine Gluconate Cloth  6 each Topical Daily  . feeding supplement (PROSource TF)  45 mL Per Tube Daily  . free water  200 mL Per Tube Q6H  . insulin aspart  0-9 Units Subcutaneous TID WC  . PHENObarbital  65 mg Intravenous BID  . polyethylene glycol  17 g Per Tube Daily  . potassium chloride  40 mEq Per Tube Daily  . pyridOXINE  100 mg Intravenous Daily  . senna  1 tablet Per Tube QHS  . thiamine injection  100 mg Intravenous Q24H   Continuous Infusions: . dextrose 5 % and 0.9 % NaCl with KCl 20 mEq/L 50 mL/hr at 02/09/20 2055  . famotidine (PEPCID) IV 20 mg (02/11/20 1222)  . feeding supplement (OSMOLITE 1.2 CAL) 1,000 mL (02/07/20 1534)  . lacosamide (VIMPAT) IV 200 mg (02/11/20 1041)  . phenytoin (DILANTIN) IV 125 mg (02/11/20 0558)    Principal Problem:   Refractory seizure (HCC) Active Problems:   Acute metabolic encephalopathy   Hypokalemia   Polysubstance abuse (HCC)   Elevated CK   Transaminitis   Chronic hepatitis C without hepatic coma (HCC)   Distended abdomen   Palliative care encounter   Colonic [facility]us [facility](HCC)   Consultants:  Neurology  Psychiatry  Interventional radiology  Surgery  Procedures:  9/14 lumbar puncture  9/15 EEG  9/16 EEG  9/17 EEG  9/19 overnight EEG with video  9/22 overnight EEG with video with discontinuation of long-term EEG monitoring on 9/25  9/24 core track placement  10/6 EEG  Antibiotics: Anti-infectives (From admission, onward)   Start     Dose/Rate Route Frequency Ordered Stop   02/05/20 0600  ceFAZolin (ANCEF) IVPB 2g/100 mL premix         2 g 200 mL/hr over 30 Minutes Intravenous To Short Stay 02/04/20 1054 02/05/20 0950   01/29/20 0600  ceFAZolin (ANCEF) IVPB 2g/100 mL premix        2 g 200 mL/hr over 30 Minutes Intravenous On call to O.R. 01/28/20 1511 01/30/20 0559   01/28/20 2000  cefTRIAXone (ROCEPHIN) 1 g in sodium chloride 0.9 % 100 mL IVPB        1 g 200 mL/hr over 30 Minutes Intravenous Every 24 hours 01/28/20 1925 02/02/20 0724   01/06/20 0900  cefTRIAXone (ROCEPHIN) 2 g in sodium chloride 0.9 % 100 mL IVPB  Status:  Discontinued        2 g 200 mL/hr over 30 Minutes Intravenous Every 12 hours 01/06/20 0105 01/06/20 2202   01/06/20 0800  vancomycin (VANCOCIN) IVPB 1000 mg/200 mL premix  Status:  Discontinued       "Followed by" Linked Group Details   1,000 mg 200 mL/hr over 60 Minutes  Intravenous Every 12 hours 01/05/20 1904 01/06/20 2202   01/05/20 2000  vancomycin (VANCOREADY) IVPB 1500 mg/300 mL       "Followed by" Linked Group Details   1,500 mg 150 mL/hr over 120 Minutes Intravenous  Once 01/05/20 1904 01/06/20 0127   01/05/20 1830  cefTRIAXone (ROCEPHIN) 2 g in sodium chloride 0.9 % 100 mL IVPB        2 g 200 mL/hr over 30 Minutes Intravenous  Once 01/05/20 1829 01/05/20 2220       Time spent: 20    [name] ANP  [facility] Pager [phone]. If 7PM-7AM, please contact night-coverage at [email or web address] 02/11/2020, 1:16 PM  LOS: 36 days

## 2020-02-11 NOTE — Progress Notes (Addendum)
Pt yelling, verbalizing that tube was torturing her, noted pt pulled out and foley. Foley attached to tape/ secured device on leg, pad saturated in urine. Pt verbalized that she still needed to void, placed on bed pan, pt voided additional 700 mL. Bladder scan volume obtained, 42 mL noted. While providing care pt verbal, A&O to self, recalled seizure and drug activity that resulted in hospitalization. However, pt believed she was being detained, and the foley and abdominal binder were torture devices. Pt conversational with this nurse during care, able to redirect and explain need for foley and GT. MD notified. Will continue to monitor, in and out as needed

## 2020-02-11 NOTE — Progress Notes (Signed)
PT Cancellation Note  [name] MRN: [id number] DOB: 05-May-1985   Cancelled Treatment:    Reason Eval/Treat Not Completed: Patient at procedure or test/unavailable. PT will attempt to follow up as time allows.   Arlyss Gandy 02/11/2020, 4:36 PM

## 2020-02-11 NOTE — Progress Notes (Signed)
New gastrostomy tube placed today.  Will hold off tube feeds until patient has been evaluated by IR on 10/22.  May use tube for medications.

## 2020-02-11 NOTE — Progress Notes (Signed)
Spoke with [employer] (IR) V.O., "ok to give meds and H2O flush through PEG", will hold off on TF, team will see pt tomorrow.

## 2020-02-11 NOTE — Progress Notes (Signed)
[facility] Progress Note  6 Days Post-Op  Subjective: Non-verbal, appears comfortable   Objective: Vital signs in last 24 hours: Temp:  [97.9 F (36.6 C)-98.5 F (36.9 C)] 98.5 F (36.9 C) (10/21 0820) Pulse Rate:  [83-89] 86 (10/21 0820) Resp:  [16-18] 18 (10/21 0820) BP: (109-137)/(74-89) 122/76 (10/21 0820) SpO2:  [97 %-99 %] 99 % (10/21 0820) Weight:  [74.7 kg] 74.7 kg (10/21 0402) Last BM Date: 02/05/20  Intake/Output from previous day: 10/20 0701 - 10/21 0700 In: 1195.9 [I.V.:970.9; IV Piggyback:225] Out: 700 [Urine:700] Intake/Output this shift: No intake/output data recorded.  PE: General: WD, WN female who is laying in bed in NAD Heart: regular, rate, and rhythm.  Lungs: Respiratory effort nonlabored Abd: soft, NT, ND, +BS, incisions c/d/i  Psych: looks at me, but non-verbal for me, flat affect   Lab Results:  Recent Labs    02/10/20 1619  WBC 6.9  HGB 12.0  HCT 35.3*  PLT 211   BMET No results for input(s): NA, K, CL, CO2, GLUCOSE, BUN, CREATININE, CALCIUM in the last 72 hours. PT/INR Recent Labs    02/11/20 0227  LABPROT 12.8  INR 1.0   CMP     Component Value Date/Time   NA 137 02/07/2020 0139   K 3.7 02/07/2020 0139   CL 101 02/07/2020 0139   CO2 27 02/07/2020 0139   GLUCOSE 138 (H) 02/07/2020 0139   BUN 16 02/07/2020 0139   CREATININE 0.64 02/07/2020 0139   CALCIUM 9.5 02/07/2020 0139   PROT 7.6 02/03/2020 0131   ALBUMIN 3.1 (L) 02/03/2020 0131   AST 80 (H) 02/03/2020 0131   ALT 121 (H) 02/03/2020 0131   ALKPHOS 86 02/03/2020 0131   BILITOT 0.6 02/03/2020 0131   GFRNONAA >60 02/07/2020 0139   GFRAA >60 01/25/2020 0308   Lipase     Component Value Date/Time   LIPASE 22 01/23/2017 1922       Studies/Results: CT ABDOMEN WO CONTRAST  Result Date: 02/10/2020 CLINICAL DATA:  Gastrostomy mild function. EXAM: CT ABDOMEN WITHOUT CONTRAST TECHNIQUE: Multidetector CT imaging of the abdomen was performed following  the standard protocol without IV contrast. COMPARISON:  01/18/2020 FINDINGS: Lower chest: The visualized lung bases are clear bilaterally. The visualized heart and pericardium are unremarkable. Hepatobiliary: No focal liver abnormality is seen. No gallstones, gallbladder wall thickening, or biliary dilatation. Pancreas: Unremarkable Spleen: Unremarkable Adrenals/Urinary Tract: The adrenal glands are unremarkable. The kidneys are normal in size and position. Punctate 1-2 mm nonobstructing calculi are seen within the interpolar region of the left kidney and lower pole of the kidneys bilaterally. No hydronephrosis. No significant perinephric stranding or perinephric fluid collections. Stomach/Bowel: Since the prior examination, a balloon retention percutaneous gastrostomy has been placed, however, this appears to have been withdrawn and its tip is now seen within the subcutaneous soft tissues of the left upper quadrant of the abdomen superficial to the rectus sheath. There is thickening of the rectus sheath as well as intramuscular hyperdensity in keeping with intramuscular hemorrhage, likely related to recent surgical procedure. The stomach appears a fixed to the anterior peritoneum subjacent to the rectus sheath in this location suggesting gastropexy. There is no free intraperitoneal gas or fluid within the upper abdomen. Subcutaneous gas within the anterior abdominal wall bilaterally likely relates to recent surgical gastrostomy. No discrete drainable fluid collection identified within the subcutaneous soft tissues. Vascular/Lymphatic: No significant vascular findings are present. No enlarged abdominal or pelvic lymph nodes. Other: Tiny broad-based fat containing umbilical hernia.  Nodular infiltration within the subcutaneous fat of the visualized lower quadrant anterior abdominal wall likely relates to subcutaneous injection. Musculoskeletal: No acute bone abnormality. IMPRESSION: Recently placed gastrostomy  catheter has been withdrawn into the subcutaneous fat of the anterior abdominal wall anterior to the rectus sheath. No associated discrete drainable fluid collection. Thickening of the rectus sheath likely related to intramuscular hemorrhage related to recent surgical procedure. Stomach remains fixed to the anterior peritoneal wall, likely related to underlying gastropexy, and no free intraperitoneal gas is identified. These results will be called to the ordering clinician or representative by the Radiologist Assistant, and communication documented in the PACS or [employer]. Electronically Signed   By: Helyn Numbers MD   On: 02/10/2020 10:40   DG ABDOMEN PEG TUBE LOCATION  Result Date: 02/09/2020 CLINICAL DATA:  Peg tube placement EXAM: ABDOMEN - 1 VIEW COMPARISON:  02/03/2020 FINDINGS: Single view of the abdomen following injection of Omnipaque 300 through patient's PEG tube. Amorphous collection of contrast around the PEG tube entry site. No definite intraluminal contrast is visible. The gas pattern is unobstructed. IMPRESSION: Intragastric location of gastrostomy tube is not confirmed on this study. Amorphous contrast collection about the tube entry site probably represents soft tissue extravasation of contrast. More precise positioning of tube could be ascertained with CT or tube injection under direct fluoroscopic observation. These results will be called to the ordering clinician or representative by the Radiologist Assistant, and communication documented in the PACS or [employer]. Electronically Signed   By: Jasmine Pang M.D.   On: 02/09/2020 19:27    Anti-infectives: Anti-infectives (From admission, onward)   Start     Dose/Rate Route Frequency Ordered Stop   02/05/20 0600  ceFAZolin (ANCEF) IVPB 2g/100 mL premix        2 g 200 mL/hr over 30 Minutes Intravenous To Short Stay 02/04/20 1054 02/05/20 0950   01/29/20 0600  ceFAZolin (ANCEF) IVPB 2g/100 mL premix        2 g 200 mL/hr  over 30 Minutes Intravenous On call to O.R. 01/28/20 1511 01/30/20 0559   01/28/20 2000  cefTRIAXone (ROCEPHIN) 1 g in sodium chloride 0.9 % 100 mL IVPB        1 g 200 mL/hr over 30 Minutes Intravenous Every 24 hours 01/28/20 1925 02/02/20 0724   01/06/20 0900  cefTRIAXone (ROCEPHIN) 2 g in sodium chloride 0.9 % 100 mL IVPB  Status:  Discontinued        2 g 200 mL/hr over 30 Minutes Intravenous Every 12 hours 01/06/20 0105 01/06/20 2202   01/06/20 0800  vancomycin (VANCOCIN) IVPB 1000 mg/200 mL premix  Status:  Discontinued       "Followed by" Linked Group Details   1,000 mg 200 mL/hr over 60 Minutes Intravenous Every 12 hours 01/05/20 1904 01/06/20 2202   01/05/20 2000  vancomycin (VANCOREADY) IVPB 1500 mg/300 mL       "Followed by" Linked Group Details   1,500 mg 150 mL/hr over 120 Minutes Intravenous  Once 01/05/20 1904 01/06/20 0127   01/05/20 1830  cefTRIAXone (ROCEPHIN) 2 g in sodium chloride 0.9 % 100 mL IVPB        2 g 200 mL/hr over 30 Minutes Intravenous  Once 01/05/20 1829 01/05/20 2220       Assessment/Plan Hx polysubstance abuse Hepatitis C Acute urinary retention/UTI Yeast vaginitis Mild Malnutrition- Prealbumin 28.2 (10/7) BMI 27.4  Dysphagia 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 - s/p lap G-tube 10/15 by  Dr. Andrey Campanile - patient pulled on G-tube 10/18 - studies showed tube outside of stomach but stomach still pexied up to abdominal wall - IR consulted to replace G-tube and planning to do this today - ok to restart TF 4 hrs from replacement from a surgery standpoint and give meds immediately after - but will defer to IR on this if they prefer to wait longer - recommend leaving g-tube in place 6-8 weeks. Can clamp if patient able to take in enough PO - will ensure she has follow up in about 1 month  - no other needs from a general surgery standpoint, we will sign off but please call if we can be of further  assistance  FEN: NPO for G-tube replacement  [id number] completed for UTI DVT: Lovenox F/U: TBD  LOS: 36 days    Juliet Rude , [facility] 02/11/2020, 9:32 AM Please see Amion for pager number during day hours 7:00am-4:30pm

## 2020-02-11 NOTE — Progress Notes (Signed)
Dr. Antionette Char asked if ok to restart TF at same order d/t pt being on unit at 1700 02/11/2020 per RN handoff.

## 2020-02-11 NOTE — Progress Notes (Signed)
Spoke with Dr. Antionette Char and he request that I reach out to IR for instructions about restarting TF. Richarda Overlie paged at this time.

## 2020-02-12 LAB — GLUCOSE, CAPILLARY
Glucose-Capillary: 100 mg/dL — ABNORMAL HIGH (ref 70–99)
Glucose-Capillary: 121 mg/dL — ABNORMAL HIGH (ref 70–99)
Glucose-Capillary: 131 mg/dL — ABNORMAL HIGH (ref 70–99)
Glucose-Capillary: 138 mg/dL — ABNORMAL HIGH (ref 70–99)
Glucose-Capillary: 171 mg/dL — ABNORMAL HIGH (ref 70–99)
Glucose-Capillary: 98 mg/dL (ref 70–99)

## 2020-02-12 MED ORDER — LACOSAMIDE 200 MG PO TABS
200.0000 mg | ORAL_TABLET | Freq: Two times a day (BID) | ORAL | Status: DC
  Administered 2020-02-12 – 2020-03-30 (×96): 200 mg
  Filled 2020-02-12 (×96): qty 1

## 2020-02-12 MED ORDER — PHENOBARBITAL 20 MG/5ML PO ELIX
60.0000 mg | ORAL_SOLUTION | Freq: Two times a day (BID) | ORAL | Status: DC
  Administered 2020-02-12 – 2020-03-30 (×95): 60 mg
  Filled 2020-02-12 (×96): qty 15

## 2020-02-12 MED ORDER — VITAMIN B-6 100 MG PO TABS
100.0000 mg | ORAL_TABLET | Freq: Every day | ORAL | Status: DC
  Administered 2020-02-12 – 2020-03-30 (×47): 100 mg
  Filled 2020-02-12 (×51): qty 1

## 2020-02-12 MED ORDER — PHENYTOIN 125 MG/5ML PO SUSP
200.0000 mg | Freq: Two times a day (BID) | ORAL | Status: DC
  Administered 2020-02-12 – 2020-03-30 (×95): 200 mg
  Filled 2020-02-12 (×98): qty 8

## 2020-02-12 MED ORDER — THIAMINE HCL 100 MG PO TABS
100.0000 mg | ORAL_TABLET | Freq: Every day | ORAL | Status: DC
  Administered 2020-02-12 – 2020-03-30 (×48): 100 mg
  Filled 2020-02-12 (×48): qty 1

## 2020-02-12 NOTE — Procedures (Signed)
Interventional Radiology Procedure Note  Procedure: Placement of percutaneous 2F pull-through gastrostomy tube. Complications: None Recommendations: - NPO except for sips and chips remainder of today and overnight   - May advance diet as tolerated and begin using tube Friday Morning Signed,   Yvone Neu. Loreta Ave, DO

## 2020-02-12 NOTE — Progress Notes (Signed)
PT Cancellation Note  [name] MRN: [id number] DOB: June 03, 1985   Cancelled Treatment:    Reason Eval/Treat Not Completed: Medical issues which prohibited therapy. RN asks to hold for today. Patient is not responding to commands. Will continue to monitor and see as appropriate.    [name] 02/12/2020, 11:34 AM

## 2020-02-12 NOTE — Progress Notes (Signed)
IR.  History of dysphasia s/p percutaneous gastrostomy tube placement in OR 02/05/2020 which was displaced; s/p percutaneous gastrostomy tube placement (new placement) in IR 02/11/2020.  Patient awake laying in bed, nonverbal at baseline, eyes follow around room. Sitter at bedside. Gastrostomy tube site soft without erythema, drainage, or active bleeding; bumper cinched to skin. Does have minimal oozing from old surgical gastrostomy tube track (to left of IR gastrostomy tube) with minimal oozing on dressing as well- this was replaced with new clean dressing.  Gastrostomy tube stable- tube is ready for use. Please ensure bumper remains cinched to skin to prevent leakage. Recommend routine dressing changes until old surgical gastrostomy tube track heals. Further plans per [facility]- appreciate and agree with management. Please call IR with questions/concerns.   Waylan Boga Alazne Quant, PA-C 02/12/2020, 10:59 AM

## 2020-02-12 NOTE — Plan of Care (Signed)

## 2020-02-12 NOTE — Progress Notes (Signed)
SLP Cancellation Note  [name] MRN: [id number] DOB: 1985/05/21   Cancelled treatment:        Pt just replaced this am and is NPO except for ice chips. Plan has been to observe with diet texture since upgraded on Tues this week but with PEG complications. Will continue efforts- may be 10/25 until we are able to return.    Royce Macadamia 02/12/2020, 10:11 AM   Breck Coons Lonell Face.Ed Nurse, children's [phone] Office [phone]

## 2020-02-12 NOTE — Progress Notes (Signed)
[facility] PROGRESS NOTE  Anita Davis WUJ:[id number] DOB: 06-02-1985 DOA: 01/04/2020 PCP: Anita Hawking, PA-C     10/20 Anita Davis is once again nonverbal and not making eye contact or following simple commands  Status: Inpatient-Remains inpatient appropriate because:Altered mental status, Ongoing diagnostic testing needed not appropriate for outpatient work up and Unsafe d/c plan -plan for PEG tube placement on 10/5   Dispo: The patient is from: Home              Anticipated d/c is to: SNF              Anticipated d/c date is: > 3 days              Patient currently is medically stable to d/c.  Due to UNSAFE DC PLAN-patient needs placement in skilled nursing facility based on altered mentation and inability to manage self-care.  She currently requires 24/7 care due to delay from presumptive diagnosis of autoimmune encephalitis.  Due to inconsistent alertness patient will continue G-tube for feedings and medications   Code Status: Full Family Communication: Updated Anita Davis "[address]" Anita Davis by voicemail on 10/21 DVT prophylaxis: Lovenox Vaccination status: Has not received Covid vaccine.  HPI: 34 y.o. F with hx substance use disorder, active, hepatitis C, and gestational diabetes who presented with confusion.  Patient initially admitted in September with encephalopathy, thought to be from drug overdose.  LP showed no signs of infection. MRI brain unremarkable. She remained encephalopathic, and EEG showed focal seizures.  The seizures were suppressed, and she was transferred to [facility] for continuous EEG, but after seizure control, she had poor neurologic recovery.  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  She deteriorated after steroids were stopped, so IVIG was started.  Subjective: Again remains lethargic.  Tracks with eyes but nonverbal in response.  Has spontaneous purposeful movement but  continues to not follow commands.  No obvious signs of pain on exam.  Objective: Vitals:   02/12/20 0826 02/12/20 1146  BP: 119/71 124/84  Pulse: 74 82  Resp: 20 20  Temp: 99.7 F (37.6 C) 98.2 F (36.8 C)  SpO2: 99% 99%    Intake/Output Summary (Last 24 hours) at 02/12/2020 1304 Last data filed at 02/12/2020 0316 Gross per 24 hour  Intake 3306.22 ml  Output 0 ml  Net 3306.22 ml   Filed Weights   02/10/20 0500 02/11/20 0402 02/12/20 0354  Weight: 76.1 kg 74.7 kg 73.1 kg    Exam: Constitutional: NAD, awake, flat affect Respiratory: clear to auscultation bilaterally, Normal respiratory effort. No accessory muscle use.  Room air Cardiovascular: Regular rate and rhythm. No extremity edema.    Abdomen: no tenderness palpation, no masses palpated.  G-tube replaced and abdominal binder in place.   Genitourinary: Foley catheter removed overnight and patient continues to void without difficulty Musculoskeletal: no clubbing / cyanosis. No joint deformity upper and lower extremities.no contractures.  No tremors or spasticity  Neurologic: CN 2-12 grossly intact, weakly moves all extremities spontaneously; previously when alert and able to [facility] she was very slow to respond. Strength upper extremities 2/5. Sensation intact.  No focal neurological deficits appreciated. 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   -The specific etiology remains unclear, but the differential for this appears to be an autoimmune encephalitis, or  post status epilepticus encephalopathy.  TSH normal.  Neuraxial imaging unremarkable.  CT C/A/P shows no signs of cancer, paraneoplastic encephalitis seems unlikely.  The clinical picture is not consistent with a true psychiatric mediated catatonia, nor  an opiate/alcohol withdrawal syndrome at this point (see below regarding psychiatry recommendations).    -Completed IVIG 10/1. Upon review of neurology notes they have documented that it may take several weeks before full effects/potential improvement from IVIG seen.  Will need reevaluation with neurologist 6 weeks from 01/22/2020 to determine if any significant improvements or whether this will be patient's baseline mentation. -of note, autoimmune studies performed on CSF fluid and apparently are still at the [facility] lab -Continue phenobarbital, Vimpat, and Dilantin-Dilantin level 2.9 on 10/6 therefore Dilantin changed from 100 mg TID to 200 mg BID -consider repeat EEG and MRI in 6 to 8 weeks after discharge -No improvement with symptoms with the addition of Seroquel so was discontinued -Continue empiric thiamine and vitamin B6 replacement -Continues to have waxing and waning mentation but more episodes of wakefulness -We will attempt to use mitts only to prevent patient from pulling at her tubing and IVs to minimize her believe that she is being held captive.  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 -With increasing periods of wakefulness patient was reevaluated by SLP and was approved for regular diet with thin liquids -Unfortunately inconsistent periods of wakefulness therefore we will continue PEG tube for medications and feedings  Nonobstructive transaminitis/hepatitis C -Elevated HCV antibody with markedly elevated HCV RNA quantitative  level  consistent with chronic hepatitis C   Nutrition Status/moderate to severe protein calorie malnutrition: Nutrition Problem: Inadequate oral intake Etiology: lethargy/confusion Signs/Symptoms: meal completion < 25% Interventions: Tube feeding, Prostat Estimated body mass index is 26.82 kg/m as calculated from the following:   Height as of this encounter: 5\' 5"  (1.651 m).   Weight as of this encounter: 73.1 kg.     Data Reviewed: Basic Metabolic Panel: Recent Labs  Lab 02/07/20 0139  NA 137  K 3.7  CL 101  CO2 27  GLUCOSE 138*  BUN 16  CREATININE 0.64  CALCIUM 9.5   Liver Function Tests: No results for input(s): AST, ALT, ALKPHOS, BILITOT, PROT, ALBUMIN in the last 168 hours. No results for input(s): LIPASE, AMYLASE in the last 168 hours. No results for input(s): AMMONIA in the last 168 hours. CBC: Recent Labs  Lab 02/10/20 1619  WBC 6.9  HGB 12.0  HCT 35.3*  MCV 94.1  PLT 211   Cardiac Enzymes: No results for input(s): CKTOTAL, CKMB, CKMBINDEX, TROPONINI in the last 168 hours. BNP (last 3 results) No results for input(s): BNP in the last 8760 hours.  ProBNP (last 3 results) No results for input(s): PROBNP in the last 8760 hours.  CBG: Recent Labs  Lab 02/11/20 2022 02/11/20 2337 02/12/20 0415 02/12/20 0825 02/12/20 1148  GLUCAP 105* 126* 131* 121* 98    Recent Results (from the past 240 hour(s))  MRSA PCR Screening     Status: Abnormal   Collection Time: 02/08/20  8:28 AM   Specimen: Nasal Mucosa; Nasopharyngeal  Result Value  Ref Range Status   MRSA by PCR POSITIVE (A) NEGATIVE Final    Comment:        The GeneXpert MRSA Assay (FDA approved for NASAL specimens only), is one component of a comprehensive MRSA colonization surveillance program. It is not intended to diagnose MRSA infection nor to guide or monitor treatment for MRSA infections. RESULT CALLED TO, READ BACK BY AND VERIFIED WITH: Dimas AguasZ. Lievano RN 10:35 02/08/20  ([email or web address]) Performed at [facility], [address] [place], [place] [address]      Studies: IR Replc Gastro/Colonic Tube Percut W/Fluoro  Result Date: 02/12/2020 INDICATION: 34 year old female with a history of displaced surgical gastrostomy which was recently performed. She presents for possible rescue possible placement EXAM: GASTROSTOMY CATHETER REPLACEMENT MEDICATIONS: 2 g Ancef; Antibiotics were administered within 1 hour of the procedure. ANESTHESIA/SEDATION: Versed 2.0 mg IV; Fentanyl 50 mcg IV Moderate Sedation Time:  44 minutes The patient was continuously monitored during the procedure by the interventional radiology nurse under my direct supervision. CONTRAST:  20mL OMNIPAQUE IOHEXOL 300 MG/ML SOLN - administered into the gastric lumen. FLUOROSCOPY TIME:  Fluoroscopy Time: 6 minutes 12 seconds (390 mGy). COMPLICATIONS: None PROCEDURE: Informed written consent was obtained from the patient's family after a thorough discussion of the procedural risks, benefits and alternatives. All questions were addressed. Maximal Sterile Barrier Technique was utilized including caps, mask, sterile gowns, sterile gloves, sterile drape, hand hygiene and skin antiseptic. A timeout was performed prior to the initiation of the procedure. The epigastrium was prepped with Betadine in a sterile fashion, and a sterile drape was applied covering the operative field. A sterile gown and sterile gloves were used for the procedure. We first probed the previous tract by removal of the partially displaced gastrostomy. We attempted to inject contrast through the skin and subcutaneous tissues, in an attempt to identify the previous tract of the stomach lumen. This was unsuccessful. A 5-French orogastric tube was then placed under fluoroscopic guidance. Scout imaging of the abdomen confirms barium within the transverse colon. The stomach was distended with gas. Under fluoroscopic guidance, an 18 gauge needle was utilized to  puncture the anterior wall of the body of the stomach. An Amplatz wire was advanced through the needle passing a T fastener into the lumen of the stomach. The T fastener was secured for gastropexy. A 9-French sheath was inserted. A snare was advanced through the 9-French sheath. A Teena DunkBenson was advanced through the orogastric tube. It was snared then pulled out the oral cavity, pulling the snare, as well. The leading edge of the gastrostomy was attached to the snare. It was then pulled down the esophagus and out the percutaneous site. Tube secured in place. Contrast was injected. Patient tolerated the procedure well and remained hemodynamically stable throughout. No complications were encountered and no significant blood loss encountered. IMPRESSION: Status post fluoroscopic placed percutaneous gastrostomy tube, with 20 [place] pull-through. Signed, Yvone NeuJaime S. Loreta AveWagner, DO [facility] Electronically Signed   By: Gilmer MorJaime  Wagner D.O.   On: 02/12/2020 09:19    Scheduled Meds: . amantadine  100 mg Per Tube BID  . Chlorhexidine Gluconate Cloth  6 each Topical Daily  . feeding supplement (PROSource TF)  45 mL Per Tube Daily  . free water  200 mL Per Tube Q6H  . insulin aspart  0-9 Units Subcutaneous TID WC  . lacosamide  200 mg Per Tube BID  . PHENObarbital  60 mg Per Tube BID  . phenytoin  200 mg Per Tube BID  . polyethylene glycol  17 g Per Tube Daily  . potassium chloride  40 mEq Per Tube Daily  . vitamin B-6  100 mg Per Tube Daily  . senna  1 tablet Per Tube QHS  . thiamine  100 mg Per Tube Daily   Continuous Infusions: . dextrose 5 % and 0.9 % NaCl with KCl 20 mEq/L 50 mL/hr at 02/11/20 2142  . famotidine (PEPCID) IV 20 mg (02/12/20 1157)  . feeding supplement (OSMOLITE 1.2 CAL) 1,000 mL (02/12/20 1123)    Principal Problem:   Refractory seizure (HCC) Active Problems:   Acute metabolic encephalopathy   Hypokalemia   Polysubstance abuse  (HCC)   Elevated CK   Transaminitis   Chronic hepatitis C without hepatic coma (HCC)   Distended abdomen   Palliative care encounter   Colonic Ileus [facility])   Consultants:  Neurology  Psychiatry  Interventional radiology  Surgery  Procedures:  9/14 lumbar puncture  9/15 EEG  9/16 EEG  9/17 EEG  9/19 overnight EEG with video  9/22 overnight EEG with video with discontinuation of long-term EEG monitoring on 9/25  9/24 core track placement  10/6 EEG  Antibiotics: Anti-infectives (From admission, onward)   Start     Dose/Rate Route Frequency Ordered Stop   02/11/20 1526  ceFAZolin (ANCEF) IVPB 2g/100 mL premix        2 g 200 mL/hr over 30 Minutes Intravenous  Once 02/11/20 1526 02/11/20 1641   02/05/20 0600  ceFAZolin (ANCEF) IVPB 2g/100 mL premix        2 g 200 mL/hr over 30 Minutes Intravenous To Short Stay 02/04/20 1054 02/05/20 0950   01/29/20 0600  ceFAZolin (ANCEF) IVPB 2g/100 mL premix        2 g 200 mL/hr over 30 Minutes Intravenous On call to O.R. 01/28/20 1511 01/30/20 0559   01/28/20 2000  cefTRIAXone (ROCEPHIN) 1 g in sodium chloride 0.9 % 100 mL IVPB        1 g 200 mL/hr over 30 Minutes Intravenous Every 24 hours 01/28/20 1925 02/02/20 0724   01/06/20 0900  cefTRIAXone (ROCEPHIN) 2 g in sodium chloride 0.9 % 100 mL IVPB  Status:  Discontinued        2 g 200 mL/hr over 30 Minutes Intravenous Every 12 hours 01/06/20 0105 01/06/20 2202   01/06/20 0800  vancomycin (VANCOCIN) IVPB 1000 mg/200 mL premix  Status:  Discontinued       "Followed by" Linked Group Details   1,000 mg 200 mL/hr over 60 Minutes Intravenous Every 12 hours 01/05/20 1904 01/06/20 2202   01/05/20 2000  vancomycin (VANCOREADY) IVPB 1500 mg/300 mL       "Followed by" Linked Group Details   1,500 mg 150 mL/hr over 120 Minutes Intravenous  Once 01/05/20 1904 01/06/20 0127   01/05/20 1830  cefTRIAXone (ROCEPHIN) 2 g in sodium chloride 0.9 % 100 mL IVPB        2 g 200 mL/hr over 30  Minutes Intravenous  Once 01/05/20 1829 01/05/20 2220       Time spent: 20    [name] ANP  [facility] Pager [phone]. If 7PM-7AM, please contact night-coverage at [email or web address] 02/12/2020, 1:04 PM  LOS: 37 days

## 2020-02-13 LAB — GLUCOSE, CAPILLARY
Glucose-Capillary: 124 mg/dL — ABNORMAL HIGH (ref 70–99)
Glucose-Capillary: 159 mg/dL — ABNORMAL HIGH (ref 70–99)
Glucose-Capillary: 152 mg/dL — ABNORMAL HIGH (ref 70–99)

## 2020-02-13 NOTE — Progress Notes (Signed)
[facility] PROGRESS NOTE  Jovonda Selner CLE:[id number] DOB: 03/13/1986 DOA: 01/04/2020 PCP: [name], PA-C      Status: Inpatient-Remains inpatient appropriate because:Altered mental status, Ongoing diagnostic testing needed not appropriate for outpatient work up and Unsafe d/c plan -plan for PEG tube placement on 10/5   Dispo: The patient is from: Home              Anticipated d/c is to: SNF              Anticipated d/c date is: > 3 days              Patient currently is medically stable to d/c.  Due to UNSAFE DC PLAN-patient needs placement in skilled nursing facility based on altered mentation and inability to manage self-care.  She currently requires 24/7 care due to delay from presumptive diagnosis of autoimmune encephalitis.  Due to inconsistent alertness patient will continue G-tube for feedings and medications   Code Status: Full Family Communication: Updated [name] "[address] [name] by voicemail on 10/21 DVT prophylaxis: Lovenox Vaccination status: Has not received Covid vaccine.  HPI: 34 y.o. F with hx substance use disorder, active, hepatitis C, and gestational diabetes who presented with confusion.  Patient initially admitted in September with encephalopathy, thought to be from drug overdose.  LP showed no signs of infection. MRI brain unremarkable. She remained encephalopathic, and EEG showed focal seizures.  The seizures were suppressed, and she was transferred to [facility] for continuous EEG, but after seizure control, she had poor neurologic recovery.  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  She deteriorated after steroids were stopped, so IVIG was started.  Subjective: No acute issues/events overnight. She remains lethargic.  Tracks with eyes inconsistently but nonverbal in response.  Has spontaneous movement -unclear if purposeful at this point, but continues to not follow  commands.  No obvious signs of pain on exam.  Objective: Vitals:   02/13/20 0000 02/13/20 0314  BP: (!) 116/91 122/85  Pulse: 68 86  Resp: 19 18  Temp: 97.9 F (36.6 C) 98.4 F (36.9 C)  SpO2: 99% 98%    Intake/Output Summary (Last 24 hours) at 02/13/2020 0804 Last data filed at 02/13/2020 9449 Gross per 24 hour  Intake 1835 ml  Output --  Net 1835 ml   Filed Weights   02/11/20 0402 02/12/20 0354 02/13/20 0402  Weight: 74.7 kg 73.1 kg 71.7 kg    Exam: Constitutional: NAD, awake, flat affect, non interactive in any meaningful way, unable to assess orientation but she is awake, appears to be alert staring at window Respiratory: clear to auscultation bilaterally, Normal respiratory effort. No accessory muscle use.  Room air Cardiovascular: Regular rate and rhythm. No extremity edema.    Abdomen: no tenderness palpation, no masses palpated.  G-tube replaced and abdominal binder in place.   Genitourinary: Foley catheter removed overnight and patient continues to void without difficulty Musculoskeletal: no clubbing / cyanosis. No joint deformity upper and lower extremities.no contractures.  No tremors or spasticity  Neurologic: CN 2-12 grossly intact, weakly moves all extremities spontaneously; previously when alert and able to [facility] she was very slow to respond. Strength upper extremities 2/5. Sensation intact.  No focal neurological deficits appreciated. 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 - Patient was admitted with acute metabolic encephalopathy, which has now persisted  for several weeks.   -The specific etiology remains unclear, but the differential for this appears to be an autoimmune encephalitis, or post status epilepticus encephalopathy.  TSH normal.  Neuraxial imaging unremarkable.  CT C/A/P shows no signs of cancer,  paraneoplastic encephalitis seems unlikely.  The clinical picture is not consistent with a true psychiatric mediated catatonia, nor an opiate/alcohol withdrawal syndrome at this point (see below regarding psychiatry recommendations).    -Completed IVIG 10/1. Upon review of neurology notes they have documented that it may take several weeks before full effects/potential improvement from IVIG seen.  Will need reevaluation with neurologist 6 weeks from 01/22/2020 to determine if any significant improvements or whether this will be patient's baseline mentation. -of note, autoimmune studies performed on CSF fluid and apparently are still at the [facility] lab -Continue phenobarbital, Vimpat, and Dilantin-Dilantin level 2.9 on 10/6 therefore Dilantin changed from 100 mg TID to 200 mg BID -consider repeat EEG and MRI in 6 to 8 weeks after discharge -No improvement with symptoms with the addition of Seroquel so was discontinued -Continue empiric thiamine and vitamin B6 replacement -Continues to have waxing and waning mentation but more episodes of wakefulness -We will attempt to use mitts only to prevent patient from pulling at her tubing and IVs to minimize her believe that she is being held captive.  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 -With increasing periods of wakefulness patient was reevaluated by SLP and was approved for regular diet with thin liquids -Unfortunately inconsistent periods of wakefulness therefore we will continue PEG tube for  medications and feedings -Sliding scale insulin/POC glucose checks discontinued - well controlled on tube feeds  Nonobstructive transaminitis/hepatitis C -Elevated HCV antibody with markedly elevated HCV RNA quantitative level  consistent with chronic hepatitis C   Nutrition Status/moderate to severe protein calorie malnutrition: Nutrition Problem: Inadequate oral intake Etiology: lethargy/confusion Signs/Symptoms: meal completion < 25% Interventions: Tube feeding, Prostat Estimated body mass index is 26.3 kg/m as calculated from the following:   Height as of this encounter: 5\' 5"  (1.651 m).   Weight as of this encounter: 71.7 kg.  Data Reviewed: Basic Metabolic Panel: Recent Labs  Lab 02/07/20 0139  NA 137  K 3.7  CL 101  CO2 27  GLUCOSE 138*  BUN 16  CREATININE 0.64  CALCIUM 9.5   Liver Function Tests: No results for input(s): AST, ALT, ALKPHOS, BILITOT, PROT, ALBUMIN in the last 168 hours. No results for input(s): LIPASE, AMYLASE in the last 168 hours. No results for input(s): AMMONIA in the last 168 hours. CBC: Recent Labs  Lab 02/10/20 1619  WBC 6.9  HGB 12.0  HCT 35.3*  MCV 94.1  PLT 211   Cardiac Enzymes: No results for input(s): CKTOTAL, CKMB, CKMBINDEX, TROPONINI in the last 168 hours. BNP (last 3 results) No results for input(s): BNP in the last 8760 hours.  ProBNP (last 3 results) No results for input(s): PROBNP in the last 8760 hours.  CBG: Recent Labs  Lab 02/12/20 1148 02/12/20 1657 02/12/20 2000 02/12/20 2358 02/13/20 0312  GLUCAP 98 100* 171* 138* 124*    Recent Results (from  the past 240 hour(s))  MRSA PCR Screening     Status: Abnormal   Collection Time: 02/08/20  8:28 AM   Specimen: Nasal Mucosa; Nasopharyngeal  Result Value Ref Range Status   MRSA by PCR POSITIVE (A) NEGATIVE Final    Comment:        The GeneXpert MRSA Assay (FDA approved for NASAL specimens only), is one component of a comprehensive MRSA  colonization surveillance program. It is not intended to diagnose MRSA infection nor to guide or monitor treatment for MRSA infections. RESULT CALLED TO, READ BACK BY AND VERIFIED WITH: Dimas AguasZ. Lievano RN 10:35 02/08/20 ([email or web address]) Performed at [facility], [address] [place], [place] [address]      Studies: IR Replc Gastro/Colonic Tube Percut W/Fluoro  Result Date: 02/12/2020 INDICATION: 34 year old female with a history of displaced surgical gastrostomy which was recently performed. She presents for possible rescue possible placement EXAM: GASTROSTOMY CATHETER REPLACEMENT MEDICATIONS: 2 g Ancef; Antibiotics were administered within 1 hour of the procedure. ANESTHESIA/SEDATION: Versed 2.0 mg IV; Fentanyl 50 mcg IV Moderate Sedation Time:  44 minutes The patient was continuously monitored during the procedure by the interventional radiology nurse under my direct supervision. CONTRAST:  20mL OMNIPAQUE IOHEXOL 300 MG/ML SOLN - administered into the gastric lumen. FLUOROSCOPY TIME:  Fluoroscopy Time: 6 minutes 12 seconds (390 mGy). COMPLICATIONS: None PROCEDURE: Informed written consent was obtained from the patient's family after a thorough discussion of the procedural risks, benefits and alternatives. All questions were addressed. Maximal Sterile Barrier Technique was utilized including caps, mask, sterile gowns, sterile gloves, sterile drape, hand hygiene and skin antiseptic. A timeout was performed prior to the initiation of the procedure. The epigastrium was prepped with Betadine in a sterile fashion, and a sterile drape was applied covering the operative field. A sterile gown and sterile gloves were used for the procedure. We first probed the previous tract by removal of the partially displaced gastrostomy. We attempted to inject contrast through the skin and subcutaneous tissues, in an attempt to identify the previous tract of the stomach lumen. This was unsuccessful. A 5-French orogastric  tube was then placed under fluoroscopic guidance. Scout imaging of the abdomen confirms barium within the transverse colon. The stomach was distended with gas. Under fluoroscopic guidance, an 18 gauge needle was utilized to puncture the anterior wall of the body of the stomach. An Amplatz wire was advanced through the needle passing a T fastener into the lumen of the stomach. The T fastener was secured for gastropexy. A 9-French sheath was inserted. A snare was advanced through the 9-French sheath. A Teena DunkBenson was advanced through the orogastric tube. It was snared then pulled out the oral cavity, pulling the snare, as well. The leading edge of the gastrostomy was attached to the snare. It was then pulled down the esophagus and out the percutaneous site. Tube secured in place. Contrast was injected. Patient tolerated the procedure well and remained hemodynamically stable throughout. No complications were encountered and no significant blood loss encountered. IMPRESSION: Status post fluoroscopic placed percutaneous gastrostomy tube, with 20 [place] pull-through. Signed, Yvone NeuJaime S. Loreta AveWagner, DO [facility] Electronically Signed   By: Gilmer MorJaime  Wagner D.O.   On: 02/12/2020 09:19    Scheduled Meds: . amantadine  100 mg Per Tube BID  . Chlorhexidine Gluconate Cloth  6 each Topical Daily  . feeding supplement (PROSource TF)  45 mL Per Tube Daily  . free water  200 mL Per Tube Q6H  .  insulin aspart  0-9 Units Subcutaneous TID WC  . lacosamide  200 mg Per Tube BID  . PHENObarbital  60 mg Per Tube BID  . phenytoin  200 mg Per Tube BID  . polyethylene glycol  17 g Per Tube Daily  . potassium chloride  40 mEq Per Tube Daily  . vitamin B-6  100 mg Per Tube Daily  . senna  1 tablet Per Tube QHS  . thiamine  100 mg Per Tube Daily   Continuous Infusions: . dextrose 5 % and 0.9 % NaCl with KCl 20 mEq/L 50 mL/hr at 02/12/20 1742  . famotidine (PEPCID) IV 20 mg  (02/12/20 1157)  . feeding supplement (OSMOLITE 1.2 CAL) 1,000 mL (02/13/20 0655)    Principal Problem:   Refractory seizure (HCC) Active Problems:   Acute metabolic encephalopathy   Hypokalemia   Polysubstance abuse (HCC)   Elevated CK   Transaminitis   Chronic hepatitis C without hepatic coma (HCC)   Distended abdomen   Palliative care encounter   Colonic [facility])   Consultants:  Neurology  Psychiatry  Interventional radiology  Surgery  Procedures:  9/14 lumbar puncture  9/15 EEG  9/16 EEG  9/17 EEG  9/19 overnight EEG with video  9/22 overnight EEG with video with discontinuation of long-term EEG monitoring on 9/25  9/24 core track placement  10/6 EEG  Antibiotics: Anti-infectives (From admission, onward)   Start     Dose/Rate Route Frequency Ordered Stop   02/11/20 1526  ceFAZolin (ANCEF) IVPB 2g/100 mL premix        2 g 200 mL/hr over 30 Minutes Intravenous  Once 02/11/20 1526 02/11/20 1641   02/05/20 0600  ceFAZolin (ANCEF) IVPB 2g/100 mL premix        2 g 200 mL/hr over 30 Minutes Intravenous To Short Stay 02/04/20 1054 02/05/20 0950   01/29/20 0600  ceFAZolin (ANCEF) IVPB 2g/100 mL premix        2 g 200 mL/hr over 30 Minutes Intravenous On call to O.R. 01/28/20 1511 01/30/20 0559   01/28/20 2000  cefTRIAXone (ROCEPHIN) 1 g in sodium chloride 0.9 % 100 mL IVPB        1 g 200 mL/hr over 30 Minutes Intravenous Every 24 hours 01/28/20 1925 02/02/20 0724   01/06/20 0900  cefTRIAXone (ROCEPHIN) 2 g in sodium chloride 0.9 % 100 mL IVPB  Status:  Discontinued        2 g 200 mL/hr over 30 Minutes Intravenous Every 12 hours 01/06/20 0105 01/06/20 2202   01/06/20 0800  vancomycin (VANCOCIN) IVPB 1000 mg/200 mL premix  Status:  Discontinued       "Followed by" Linked Group Details   1,000 mg 200 mL/hr over 60 Minutes Intravenous Every 12 hours 01/05/20 1904 01/06/20 2202   01/05/20 2000  vancomycin (VANCOREADY) IVPB 1500 mg/300 mL       "Followed  by" Linked Group Details   1,500 mg 150 mL/hr over 120 Minutes Intravenous  Once 01/05/20 1904 01/06/20 0127   01/05/20 1830  cefTRIAXone (ROCEPHIN) 2 g in sodium chloride 0.9 % 100 mL IVPB        2 g 200 mL/hr over 30 Minutes Intravenous  Once 01/05/20 1829 01/05/20 2220       Time spent: 20  Azucena Fallen DO  [facility] Pager: Epic chat  If 7PM-7AM, please contact night-coverage at [email or web address] 02/13/2020, 8:04 AM  LOS: 38 days

## 2020-02-13 NOTE — Progress Notes (Signed)
Pt has had no seizure activity noted during this shift. Tolerating tube feeding well.  Two episode of urine incontinence, large amount requiring complete bed linen change and peri care. Pt has been mute but is alert and at times looks towards person speaking to her. Does not answer when asked about pain but per faces scale, she is a zero. Has not required any suctioning this shift. Bladder scanned once with volume of , but was incontinent of large amount of urine shortly after. IV fluids continue via patent IV. Hand mitts remain on with no attempts to remove. Pt has been calm throughout the day. 1:1 sitter d/c'd this morning at 1100 per MD. Will continue to monitor.

## 2020-02-13 NOTE — Plan of Care (Signed)
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
## 2020-02-15 LAB — COMPREHENSIVE METABOLIC PANEL
ALT: 160 U/L — ABNORMAL HIGH (ref 0–44)
AST: 112 U/L — ABNORMAL HIGH (ref 15–41)
Albumin: 3.3 g/dL — ABNORMAL LOW (ref 3.5–5.0)
Alkaline Phosphatase: 121 U/L (ref 38–126)
Anion gap: 12 (ref 5–15)
BUN: 11 mg/dL (ref 6–20)
CO2: 24 mmol/L (ref 22–32)
Calcium: 9.4 mg/dL (ref 8.9–10.3)
Chloride: 102 mmol/L (ref 98–111)
Creatinine, Ser: 0.52 mg/dL (ref 0.44–1.00)
GFR, Estimated: >60 mL/min (ref >60)
Glucose, Bld: 130 mg/dL — ABNORMAL HIGH (ref 70–99)
Potassium: 4 mmol/L (ref 3.5–5.1)
Sodium: 138 mmol/L (ref 135–145)
Total Bilirubin: 0.4 mg/dL (ref 0.3–1.2)
Total Protein: 7.4 g/dL (ref 6.5–8.1)

## 2020-02-15 MED ORDER — OSMOLITE 1.2 CAL PO LIQD
1000.0000 mL | Freq: Every day | ORAL | Status: DC
  Administered 2020-02-15 – 2020-02-16 (×2): 1000 mL
  Filled 2020-02-15: qty 1000

## 2020-02-15 MED ORDER — SERTRALINE HCL 50 MG PO TABS
25.0000 mg | ORAL_TABLET | Freq: Every day | ORAL | Status: DC
  Administered 2020-02-15 – 2020-02-16 (×2): 25 mg via ORAL
  Filled 2020-02-15 (×2): qty 1
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
## 2020-02-15 NOTE — Progress Notes (Signed)
  Speech Language Pathology Treatment: Dysphagia  [name] MRN: [id number] DOB: 1986/03/03 Today's Date: 02/15/2020 Time: 1040-1050 SLP Time Calculation (min) (ACUTE ONLY): 10 min  Assessment / Plan / Recommendation Clinical Impression  Pt was seen for skilled ST targeting diet tolerance and diagnostic tx.  She was encountered awake/alert, sitting upright in a chair.  She was seen with trials of thin liquid via cup sip and regular solids.  Mastication of regular solids was timely, and no oral residue was observed following swallow initiation.  No overt s/sx of aspiration were observed with thin liquid trials or regular solids on this date.  Recommend continuation of regular solids and thin liquids with medications administered whole in puree or via alternate means.  SLP will briefly f/u to monitor diet tolerance.     HPI HPI: Pt is a 34 year old woman who presented to [facility] 01/04/2020 with suspected methamphetamine use-discharged and brought back the same day with confusion and agitation. Toxicology consistent only with benzodiazepine and THC and not on any stimulants. 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 Per chart, mentation has been fluctuating. PMH includes hepatitis C and polysubstance abuse.      SLP Plan  Continue with current plan of care       Recommendations  Diet recommendations: Regular;Thin liquid Liquids provided via: Cup;Straw Medication Administration: Whole meds with puree Supervision: Staff to assist with self feeding;Full supervision/cueing for compensatory strategies Compensations: Minimize environmental distractions;Slow rate;Small sips/bites Postural Changes and/or Swallow Maneuvers: Seated upright 90 degrees                Oral Care Recommendations: Oral care BID Follow up Recommendations: Skilled Nursing facility;24 hour supervision/assistance SLP Visit Diagnosis: Dysphagia, unspecified  (R13.10) Plan: Continue with current plan of care                 Villa Herb M.S., CCC-SLP Acute Rehabilitation Services Office: [phone]  Anita Davis 02/15/2020, 11:12 AM
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
## 2020-02-16 ENCOUNTER — Inpatient Hospital Stay (HOSPITAL_COMMUNITY): Payer: Medicaid Other

## 2020-02-16 MED ORDER — SERTRALINE HCL 50 MG PO TABS
25.0000 mg | ORAL_TABLET | Freq: Every day | ORAL | Status: DC
  Administered 2020-02-17: 25 mg
  Filled 2020-02-16: qty 1

## 2020-02-16 MED ORDER — LORAZEPAM 2 MG/ML IJ SOLN
2.0000 mg | INTRAMUSCULAR | Status: DC | PRN
  Administered 2020-03-16 – 2020-03-18 (×3): 2 mg via INTRAVENOUS
  Filled 2020-02-16 (×4): qty 1

## 2020-02-16 MED ORDER — HYDROXYZINE HCL 50 MG/ML IM SOLN
50.0000 mg | Freq: Once | INTRAMUSCULAR | Status: AC
  Administered 2020-02-16: 50 mg via INTRAMUSCULAR
  Filled 2020-02-16: qty 1

## 2020-02-16 MED ORDER — LORAZEPAM 1 MG PO TABS
1.0000 mg | ORAL_TABLET | Freq: Four times a day (QID) | ORAL | Status: DC | PRN
  Administered 2020-02-16: 1 mg via ORAL
  Filled 2020-02-16: qty 1
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
## 2020-02-17 LAB — GLUCOSE, CAPILLARY
Glucose-Capillary: 115 mg/dL — ABNORMAL HIGH (ref 70–99)
Glucose-Capillary: 122 mg/dL — ABNORMAL HIGH (ref 70–99)

## 2020-02-17 MED ORDER — PROSOURCE TF PO LIQD
45.0000 mL | Freq: Two times a day (BID) | ORAL | Status: DC
  Administered 2020-02-17 – 2020-02-23 (×12): 45 mL
  Filled 2020-02-17 (×11): qty 45

## 2020-02-17 MED ORDER — QUETIAPINE FUMARATE 25 MG PO TABS
12.5000 mg | ORAL_TABLET | Freq: Two times a day (BID) | ORAL | Status: DC
  Administered 2020-02-17 – 2020-02-18 (×3): 12.5 mg
  Filled 2020-02-17 (×4): qty 1

## 2020-02-17 MED ORDER — QUETIAPINE FUMARATE 25 MG PO TABS
25.0000 mg | ORAL_TABLET | Freq: Two times a day (BID) | ORAL | Status: DC
  Administered 2020-02-17: 25 mg
  Filled 2020-02-17: qty 1

## 2020-02-17 MED ORDER — FAMOTIDINE 40 MG/5ML PO SUSR
20.0000 mg | Freq: Two times a day (BID) | ORAL | Status: DC
  Administered 2020-02-17 – 2020-03-30 (×85): 20 mg
  Filled 2020-02-17 (×87): qty 2.5

## 2020-02-17 MED ORDER — QUETIAPINE FUMARATE 25 MG PO TABS: 25.0000 mg | ORAL_TABLET | Freq: Two times a day (BID) | ORAL | Status: DC

## 2020-02-17 MED ORDER — FAMOTIDINE 40 MG/5ML PO SUSR: 20.0000 mg | Freq: Two times a day (BID) | ORAL | Status: DC

## 2020-02-17 MED ORDER — LORAZEPAM 0.5 MG PO TABS
0.5000 mg | ORAL_TABLET | Freq: Four times a day (QID) | ORAL | Status: DC | PRN
  Administered 2020-02-17: 0.5 mg via ORAL
  Filled 2020-02-17: qty 1

## 2020-02-17 MED ORDER — ENSURE ENLIVE PO LIQD
237.0000 mL | Freq: Two times a day (BID) | ORAL | Status: DC
  Administered 2020-02-18 – 2020-02-19 (×3): 237 mL via ORAL

## 2020-02-17 MED ORDER — OSMOLITE 1.2 CAL PO LIQD
780.0000 mL | Freq: Every day | ORAL | Status: DC
  Administered 2020-02-17 – 2020-02-22 (×5): 780 mL
  Filled 2020-02-17: qty 948

## 2020-02-17 NOTE — Progress Notes (Signed)
Nutrition Follow-up  DOCUMENTATION CODES:   Not applicable  INTERVENTION:  Continue nocturnal TF via G-tube: -Osmolite 1.2 cal @ 23ml/hr for 12 hours from 1800-0600 ( ) -65ml Prosource TF BID -free water per MD, currently Q6H  Provides1016 kcals (meets 56% minimum needs), 65 grams of protein, and free water(1439ml total free water with flushes)   Will also order Ensure Enlive po BID, each supplement provides 350 kcal and 20 grams of protein   NUTRITION DIAGNOSIS:   Inadequate oral intake related to lethargy/confusion as evidenced by meal completion < 25%.  ongoing  GOAL:   Patient will meet greater than or equal to 90% of their needs  Being addressed via TF and supplements  MONITOR:   TF tolerance, Labs, Weight trends, I & O's  REASON FOR ASSESSMENT:   Consult Enteral/tube feeding initiation and management  ASSESSMENT:   Pt with refractory seizures with acute metabolic encephalopathy. PMH includes hepatitis and polysubstance abuse.  9/24 Cortrak placed (gastric) 10/10 Cortrak dislodged, TF held 10/11 Cortrak advanced (tip in proximal duodenum per KUB), TF restarted  10/14 TF reduced to 37ml/hr due to colonic ileus 10/15 G-tube placement 10/16 TF resumed 10/18 TF rate returned to 79ml/hr; G-tube pulled out 1 inch so TF held 10/21 G-tube replaced 10/27 diet advanced to regular  Pt very drowsy today, but is noted to have been having episodes of confusion and agitation.   Per MD, pt's metabolic encephalopathy is now thought to be more consistent with brain injury from likely combination of status epilepticus and anoxia.   Now that pt's diet has advanced, MD has transitioned pt to nocturnal TF (Osmolite 1.2 cal from 1800-0600), though has not indicated a rate. Also has orders for 84ml Prosource TF daily and free water Q6H. Will adjust nocturnal TF orders to provide 56% of pt's kcal needs. Will monitor pt's PO intake and make adjustments to  TF regimen as appropriate. Will also order oral nutrition supplements to promote po intake.  Admit wt: 74.8 kg Current wt: 71.5 kg  Labs reviewed.  Medications: Pepcid, Miralax, KCl , Vitamin B6, Senokot, Thiamine  Diet Order:   Diet Order            Diet regular Room service appropriate? Yes; Fluid consistency: Thin  Diet effective now                 EDUCATION NEEDS:   No education needs have been identified at this time  Skin:  Skin Assessment: Skin Integrity Issues: Skin Integrity Issues:: Incisions Incisions: abdomen  Last BM:  10/25 type 4  Height:   Ht Readings from Last 1 Encounters:  01/06/20 5\' 5"  (1.651 m)    Weight:   Wt Readings from Last 1 Encounters:  02/16/20 71.5 kg    BMI:  Body mass index is 26.23 kg/m.  Estimated Nutritional Needs:   Kcal:  1800-2000  Protein:  90-100 grams  Fluid:  >/=1.8L/d    02/18/20, MS, RD, LDN RD pager number and weekend/on-call pager number located in [place].
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
## 2020-02-17 NOTE — Progress Notes (Signed)
  Speech Language Pathology Treatment: Dysphagia  [name] MRN: [id number] DOB: June 03, 1985 Today's Date: 02/17/2020 Time: 3845-3646 SLP Time Calculation (min) (ACUTE ONLY): 8 min  Assessment / Plan / Recommendation Clinical Impression  Pt alert and upright in chair, at times visually attentive to speaker, but will not follow commands or verbally respond to questions. She did shake her head no occasionally. Pt was able to self feed sips of thin liquids and accepted total assisted feeding with regular texture snacks. She reportedly has been eating and drinking floor stock snacks well. Will resume a regular diet and thin liquids as there is no signs of dysphagia other than cognitive impairment impacting interest in food at times. Will sign off for swallowing.   HPI HPI: Pt is a 34 year old woman who presented to [facility] 01/04/2020 with suspected methamphetamine use-discharged and brought back the same day with confusion and agitation. Toxicology consistent only with benzodiazepine and THC and not on any stimulants. 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 Per chart, mentation has been fluctuating. PMH includes hepatitis C and polysubstance abuse.      SLP Plan  All goals met       Recommendations  Diet recommendations: Regular;Thin liquid                Follow up Recommendations: Skilled Nursing facility Plan: All goals met       GO               Herbie Baltimore, MA [place] Pager [phone] Office [phone]  Anita Davis 02/17/2020, 10:55 AM

## 2020-02-17 NOTE — Progress Notes (Signed)
Physical Therapy Treatment [name] MRN: [id number] DOB: 1986/03/02 Today's Date: 02/17/2020    History of Present Illness 34 year old with past medical history significant for hepatitis C, polysubstance abuse brought to the hospital 9/13 for disorientation which was suspected to be substance abuse.  After she became more lucid, she was discharged, but then police brought her back later in the day and they found her passed out in a parking lot.  Urine drug screen was positive for benzodiazepine and THC. An EEG done on 9/15 showed patient was in status epilepticus.  Patient was a started on Keppra.  Despite these, EEG noted continued seizures requiring additional medications as well.  Finally on 918, seizures broke. Follow-up EEG on 9/20 noted evidence of epileptic [name] from the left central temporal region.  Repeat EEG done on 9/22 noted epileptogenic city from the left central temporal region.  Pt with PEG placed on 02/05/20.    PT Comments    Pt tolerates treatment well with a significant increase in ambulation distance. Pt does not verbally communicate this session and demonstrates very limited command following at this time, requiring physical initiation of all mobility at this time. Pt with increased postural sway during ambulation, often utilizing UE support but with some increase in extensor action of UEs which leads to pushing away from the side of the UE support. PT continues to recommend SNF placement at this time.   Follow Up Recommendations  SNF;Supervision/Assistance - 24 hour     Equipment Recommendations  Wheelchair (measurements PT);Wheelchair cushion (measurements PT);Hospital bed;Other (comment);3in1 (PT)    Recommendations for Other Services       Precautions / Restrictions Precautions Precautions: Fall Precaution Comments: Seizure, PEG Restrictions Weight Bearing Restrictions: No    Mobility  Bed Mobility                   Transfers Overall transfer level: Needs assistance Equipment used: 2 person hand held assist Transfers: Sit to/from Stand Sit to Stand: Mod assist;+2 physical assistance         General transfer comment: pt requires initiation of forward lean and stand with bilateral hand hold  Ambulation/Gait Ambulation/Gait assistance: Mod assist;+2 physical assistance Gait Distance (Feet): 80 Feet Assistive device: 2 person hand held assist;1 person hand held assist Gait Pattern/deviations: Drifts right/left;Ataxic Gait velocity: reduced Gait velocity interpretation: <1.31 ft/sec, indicative of household ambulator General Gait Details: pt with increased lateral drift, high guard with BUE support initially, left lean early during mobility progressing to more of a right lean with only LUE support of PT for final 50' of ambulation   Stairs             Wheelchair Mobility    Modified Rankin (Stroke Patients Only)       Balance Overall balance assessment: Needs assistance Sitting-balance support: Feet supported;Single extremity supported;Bilateral upper extremity supported Sitting balance-Leahy Scale: Poor Sitting balance - Comments: limited periods of unsupported sitting in recliner, requires cues to intiate and minG when donning abdominal binder and gait belt Postural control: Posterior lean Standing balance support: Bilateral upper extremity supported Standing balance-Leahy Scale: Poor Standing balance comment: modA with bilateral or unilateral hand hold                            Cognition Arousal/Alertness: Awake/alert Behavior During Therapy: Flat affect Overall Cognitive Status: Impaired/Different from baseline Area of Impairment: Following commands;Safety/judgement;Awareness;Problem solving  Following Commands: Follows one step commands inconsistently Safety/Judgement: Decreased awareness of safety;Decreased awareness of  deficits Awareness: Intellectual Problem Solving: Slow processing;Decreased initiation;Difficulty sequencing;Requires verbal cues;Requires tactile cues        Exercises      General Comments General comments (skin integrity, edema, etc.): pt does begin to grimace near completion of ambulation, appears to be upset about something, pt is unable to verbally express herself during this session      Pertinent Vitals/Pain Pain Assessment: Faces Faces Pain Scale: Hurts even more Pain Location: generalized Pain Descriptors / Indicators: Grimacing Pain Intervention(s): Monitored during session    Home Living                      Prior Function            PT Goals (current goals can now be found in the care plan section) Acute Rehab PT Goals Patient Stated Goal: unable to state Progress towards PT goals: Progressing toward goals    Frequency    Min 2X/week      PT Plan Current plan remains appropriate    Co-evaluation PT/OT/SLP Co-Evaluation/Treatment: Yes Reason for Co-Treatment: Necessary to address cognition/behavior during functional activity;For patient/therapist safety;To address functional/ADL transfers PT goals addressed during session: Mobility/safety with mobility;Balance;Strengthening/ROM        AM-PAC PT "6 Clicks" Mobility   Outcome Measure  Help needed turning from your back to your side while in a flat bed without using bedrails?: A Little Help needed moving from lying on your back to sitting on the side of a flat bed without using bedrails?: A Little Help needed moving to and from a bed to a chair (including a wheelchair)?: A Lot Help needed standing up from a chair using your arms (e.g., wheelchair or bedside chair)?: A Lot Help needed to walk in hospital room?: A Lot Help needed climbing 3-5 steps with a railing? : Total 6 Click Score: 13    End of Session Equipment Utilized During Treatment: Gait belt Activity Tolerance: Patient  tolerated treatment well Patient left: in chair;with call bell/phone within reach;with chair alarm set Nurse Communication: Mobility status PT Visit Diagnosis: Unsteadiness on feet (R26.81);Muscle weakness (generalized) (M62.81);Difficulty in walking, not elsewhere classified (R26.2);Other symptoms and signs involving the nervous system (W25.852)     Time: 7782-4235 PT Time Calculation (min) (ACUTE ONLY): 23 min  Charges:  $Gait Training: 8-22 mins                     Arlyss Gandy, PT, DPT Acute Rehabilitation Pager: [phone]    Arlyss Gandy 02/17/2020, 10:52 AM

## 2020-02-18 LAB — GLUCOSE, CAPILLARY
Glucose-Capillary: 113 mg/dL — ABNORMAL HIGH (ref 70–99)
Glucose-Capillary: 98 mg/dL (ref 70–99)
Glucose-Capillary: 116 mg/dL — ABNORMAL HIGH (ref 70–99)

## 2020-02-18 MED ORDER — SERTRALINE HCL 25 MG PO TABS
37.5000 mg | ORAL_TABLET | Freq: Every day | ORAL | Status: DC
  Administered 2020-02-18 – 2020-02-29 (×12): 37.5 mg
  Filled 2020-02-18 (×12): qty 1.5

## 2020-02-18 MED ORDER — SERTRALINE HCL 50 MG PO TABS: 25.0000 mg | ORAL_TABLET | Freq: Every day | ORAL | Status: DC
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
## 2020-02-19 LAB — GLUCOSE, CAPILLARY
Glucose-Capillary: 113 mg/dL — ABNORMAL HIGH (ref 70–99)
Glucose-Capillary: 148 mg/dL — ABNORMAL HIGH (ref 70–99)
Glucose-Capillary: 95 mg/dL (ref 70–99)
Glucose-Capillary: 112 mg/dL — ABNORMAL HIGH (ref 70–99)

## 2020-02-19 MED ORDER — LORAZEPAM 0.5 MG PO TABS
0.5000 mg | ORAL_TABLET | Freq: Four times a day (QID) | ORAL | Status: DC | PRN
  Administered 2020-02-21 – 2020-03-19 (×21): 0.5 mg
  Filled 2020-02-19 (×22): qty 1

## 2020-02-19 MED ORDER — ACETAMINOPHEN 325 MG PO TABS
650.0000 mg | ORAL_TABLET | Freq: Four times a day (QID) | ORAL | Status: DC | PRN
  Administered 2020-02-21 – 2020-03-14 (×11): 650 mg
  Filled 2020-02-19 (×11): qty 2

## 2020-02-19 MED ORDER — ENSURE ENLIVE PO LIQD
237.0000 mL | Freq: Two times a day (BID) | ORAL | Status: DC
  Administered 2020-02-19 – 2020-02-23 (×7): 237 mL
  Filled 2020-02-19 (×2): qty 237

## 2020-02-19 MED ORDER — QUETIAPINE FUMARATE 25 MG PO TABS
12.5000 mg | ORAL_TABLET | Freq: Every day | ORAL | Status: DC
  Administered 2020-02-19 – 2020-02-22 (×4): 12.5 mg
  Filled 2020-02-19 (×4): qty 1

## 2020-02-19 MED ORDER — OXYCODONE HCL 5 MG PO TABS
5.0000 mg | ORAL_TABLET | ORAL | Status: DC | PRN
  Administered 2020-02-28 – 2020-03-13 (×16): 5 mg
  Filled 2020-02-19 (×16): qty 1
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
## 2020-02-20 LAB — GLUCOSE, CAPILLARY: Glucose-Capillary: 116 mg/dL — ABNORMAL HIGH (ref 70–99)

## 2020-02-20 NOTE — Progress Notes (Signed)
PROGRESS NOTE    Anita Davis  WCH:[id number] DOB: Jan 19, 1986 DOA: 01/04/2020 PCP: [name], PA-C   Brief Narrative:  34 year old with history of hep C, substance abuse disorder, gestational diabetes admitted for confusion.  Prolonged hospital stay for encephalopathy, focal seizures and poor neurologic recovery.  Concerns for autoimmune encephalitis therefore he received IVIG.  Slowly improving at this time.  At this time awaiting placement.   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  Physical deconditioning -PT recommending SNF, awaiting placement.  Suspected chronic depression in context of ongoing polysubstance abuse prior to admission -Poor social support.  Dislodged gastrostomy tube -Placed by IR 10/21.  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  Nutrition Status/moderate to severe protein calorie malnutrition: -Feeding tube currently in place   DVT prophylaxis: Lovenox Code Status: Full Family Communication: None   Dispo: The patient is from: Home              Anticipated d/c is to: SNF              Anticipated d/c date is: > 3 days              Patient currently is medically stable to d/c.  Unsafe discharge.  Due to placement issues.   Body mass index is 26.67 kg/m.       Subjective: No acute overnight events.  Sitting up in the bed staring at me but no meaningful  conversation.  Review of Systems Otherwise negative except as per HPI, including: No meaningful interaction with not in acute distress  Examination:  Constitutional: Not in acute distress Respiratory: Clear to auscultation bilaterally Cardiovascular: Normal sinus rhythm, no rubs Abdomen: Nontender nondistended good bowel sounds Musculoskeletal: No edema noted Skin: No rashes seen Neurologic: Grossly moving all the extremities Psychiatric: Poor judgment and insight.  Does not participate in conversation therefore difficult to assess  Objective: Vitals:   02/20/20 0015 02/20/20 0346 02/20/20 0500 02/20/20 0744  BP: (!) 142/87 (!) 120/98  (!) 124/94  Pulse: 72 93  100  Resp: 18 19  18   Temp: 97.7 F (36.5 C) 98.7 F (37.1 C)  98.9 F (37.2 C)  TempSrc:  Oral  Axillary  SpO2: 98% 99%  98%  Weight:   72.7 kg   Height:        Intake/Output Summary (Last 24 hours) at 02/20/2020 0841 Last data filed at 02/20/2020 02/22/2020 Gross per 24 hour  Intake 525.42 ml  Output 0 ml  Net 525.42 ml   Filed Weights   02/14/20 0500 02/16/20 0416 02/20/20 0500  Weight: 71.3 kg 71.5 kg 72.7 kg     Data Reviewed:   CBC: No results for input(s): WBC, NEUTROABS, HGB, HCT, MCV, PLT in the last 168 hours. Basic Metabolic Panel: Recent Labs  Lab 02/15/20 0433  NA 138  K 4.0  CL 102  CO2 24  GLUCOSE 130*  BUN 11  CREATININE 0.52  CALCIUM 9.4   GFR: Estimated Creatinine Clearance:  97.1 mL/min (by C-G formula based on SCr of 0.52 mg/dL). Liver Function Tests: Recent Labs  Lab 02/15/20 0433  AST 112*  ALT 160*  ALKPHOS 121  BILITOT 0.4  PROT 7.4  ALBUMIN 3.3*   No results for input(s): LIPASE, AMYLASE in the last 168 hours. No results for input(s): AMMONIA in the last 168 hours. Coagulation Profile: No results for input(s): INR, PROTIME in the last 168 hours. Cardiac Enzymes: No results for input(s): CKTOTAL, CKMB, CKMBINDEX, TROPONINI in the last 168 hours. BNP (last 3  results) No results for input(s): PROBNP in the last 8760 hours. HbA1C: No results for input(s): HGBA1C in the last 72 hours. CBG: Recent Labs  Lab 02/19/20 0715 02/19/20 1216 02/19/20 1600 02/19/20 2136 02/20/20 0619  GLUCAP 113* 148* 95 112* 116*   Lipid Profile: No results for input(s): CHOL, HDL, LDLCALC, TRIG, CHOLHDL, LDLDIRECT in the last 72 hours. Thyroid Function Tests: No results for input(s): TSH, T4TOTAL, FREET4, T3FREE, THYROIDAB in the last 72 hours. Anemia Panel: No results for input(s): VITAMINB12, FOLATE, FERRITIN, TIBC, IRON, RETICCTPCT in the last 72 hours. Sepsis Labs: No results for input(s): PROCALCITON, LATICACIDVEN in the last 168 hours.  No results found for this or any previous visit (from the past 240 hour(s)).       Radiology Studies: No results found.      Scheduled Meds: . amantadine  100 mg Per Tube BID  . Chlorhexidine Gluconate Cloth  6 each Topical Daily  . famotidine  20 mg Per Tube BID  . feeding supplement  237 mL Per Tube BID BM  . feeding supplement (OSMOLITE 1.2 CAL)  780 mL Per Tube QHS  . feeding supplement (PROSource TF)  45 mL Per Tube BID  . free water  200 mL Per Tube Q6H  . lacosamide  200 mg Per Tube BID  . PHENObarbital  60 mg Per Tube BID  . phenytoin  200 mg Per Tube BID  . polyethylene glycol  17 g Per Tube Daily  . potassium chloride  40 mEq Per Tube Daily  . vitamin B-6  100 mg Per Tube Daily  . QUEtiapine  12.5 mg Per Tube QHS  . senna  1 tablet Per Tube QHS  . sertraline  37.5 mg Per Tube Daily  . thiamine  100 mg Per Tube Daily   Continuous Infusions:   LOS: 45 days   Time spent= 15 mins    Lynde Ludwig Joline Maxcy, MD [facility]  If 7PM-7AM, please contact night-coverage  02/20/2020, 8:41 AM

## 2020-02-21 LAB — GLUCOSE, CAPILLARY
Glucose-Capillary: 130 mg/dL — ABNORMAL HIGH (ref 70–99)
Glucose-Capillary: 71 mg/dL (ref 70–99)

## 2020-02-21 NOTE — Progress Notes (Signed)
As per outgoing RN report, MD gave verbal order to HOLD feeding overnight until the IR sees the patient in the morning.

## 2020-02-21 NOTE — Plan of Care (Signed)
  Problem: Education: Goal: Knowledge of General Education information will improve Description: Including pain rating scale, medication(s)/side effects and non-pharmacologic comfort measures Outcome: Not Progressing   Problem: Health Behavior/Discharge Planning: Goal: Ability to manage health-related needs will improve Outcome: Not Progressing   Problem: Clinical Measurements: Goal: Ability to maintain clinical measurements within normal limits will improve Outcome: Not Progressing Goal: Will remain free from infection Outcome: Not Progressing Goal: Diagnostic test results will improve Outcome: Not Progressing Goal: Respiratory complications will improve Outcome: Not Progressing Goal: Cardiovascular complication will be avoided Outcome: Not Progressing   Problem: Activity: Goal: Risk for activity intolerance will decrease Outcome: Not Progressing   Problem: Nutrition: Goal: Adequate nutrition will be maintained Outcome: Not Progressing   Problem: Coping: Goal: Level of anxiety will decrease Outcome: Not Progressing   Problem: Elimination: Goal: Will not experience complications related to bowel motility Outcome: Not Progressing Goal: Will not experience complications related to urinary retention Outcome: Not Progressing   Problem: Pain Managment: Goal: General experience of comfort will improve Outcome: Not Progressing   Problem: Safety: Goal: Ability to remain free from injury will improve Outcome: Not Progressing   Problem: Skin Integrity: Goal: Risk for impaired skin integrity will decrease Outcome: Not Progressing   Problem: Education: Goal: Expressions of having a comfortable level of knowledge regarding the disease process will increase Outcome: Not Progressing   Problem: Coping: Goal: Ability to adjust to condition or change in health will improve Outcome: Not Progressing Goal: Ability to identify appropriate support needs will improve Outcome: Not  Progressing   Problem: Health Behavior/Discharge Planning: Goal: Compliance with prescribed medication regimen will improve Outcome: Not Progressing   Problem: Medication: Goal: Risk for medication side effects will decrease Outcome: Not Progressing   Problem: Clinical Measurements: Goal: Complications related to the disease process, condition or treatment will be avoided or minimized Outcome: Not Progressing Goal: Diagnostic test results will improve Outcome: Not Progressing   Problem: Safety: Goal: Verbalization of understanding the information provided will improve Outcome: Not Progressing   Problem: Self-Concept: Goal: Level of anxiety will decrease Outcome: Not Progressing Goal: Ability to verbalize feelings about condition will improve Outcome: Not Progressing  Patient is not progressing towards goals due to altered mental status. Unable to learn at this time.

## 2020-02-21 NOTE — Progress Notes (Signed)
PROGRESS NOTE    Anita Davis  HGD:[id number] DOB: Sep 13, 1985 DOA: 01/04/2020 PCP: Anita Hawking, PA-C   Brief Narrative:  34 year old with history of hep C, substance abuse disorder, gestational diabetes admitted for confusion.  Prolonged hospital stay for encephalopathy, focal seizures and poor neurologic recovery.  Concerns for autoimmune encephalitis therefore he received IVIG.  Slowly improving at this time.  At this time awaiting placement.   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  Physical deconditioning -PT recommending SNF, awaiting placement.  Suspected chronic depression in context of ongoing polysubstance abuse prior to admission -Poor social support.  Dislodged gastrostomy tube -Placed by IR 10/21.  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  Nutrition Status/moderate to severe protein calorie malnutrition: -Feeding tube currently in place   DVT prophylaxis: Lovenox Code Status: Full Family Communication: None   Dispo: The patient is from: Home              Anticipated d/c is to: SNF              Anticipated d/c date is: > 3 days              Patient currently is medically stable to d/c.  Unsafe discharge.  Due to placement issues.   Body mass index is 29.31 kg/m.       Subjective: Sitting up in the chair with no meaningful interaction.  Tracks me across the  room.  Review of Systems Otherwise negative except as per HPI, including: No meaningful interaction with not in acute distress  Examination: Constitutional: Not in acute distress Respiratory: Clear to auscultation bilaterally Cardiovascular: Normal sinus rhythm, no rubs Abdomen: Nontender nondistended good bowel sounds Musculoskeletal: No edema noted Skin: No rashes seen Neurologic: Grossly moving all the extremities Psychiatric: Difficult to assess, does not participate in conversation  Objective: Vitals:   02/20/20 2020 02/20/20 2330 02/21/20 0328 02/21/20 0430  BP: 113/80 108/84 96/65   Pulse: 79 81 70   Resp: 20 18 16    Temp: 98.4 F (36.9 C) 98.2 F (36.8 C) 97.8 F (36.6 C)   TempSrc: Oral Oral Oral   SpO2: 97% 99% 98%   Weight:    79.9 kg  Height:        Intake/Output Summary (Last 24 hours) at 02/21/2020 0828 Last data filed at 02/21/2020 02/23/2020 Gross per 24 hour  Intake 1558.92 ml  Output --  Net 1558.92 ml   Filed Weights   02/16/20 0416 02/20/20 0500 02/21/20 0430  Weight: 71.5 kg 72.7 kg 79.9 kg     Data Reviewed:   CBC: No results for input(s): WBC, NEUTROABS, HGB, HCT, MCV, PLT in the last 168 hours. Basic Metabolic Panel: Recent Labs  Lab 02/15/20 0433  NA 138  K 4.0  CL 102  CO2 24  GLUCOSE 130*  BUN 11  CREATININE 0.52  CALCIUM 9.4   GFR: Estimated Creatinine Clearance: 101.6 mL/min (by C-G formula based on SCr of 0.52 mg/dL). Liver Function  Tests: Recent Labs  Lab 02/15/20 0433  AST 112*  ALT 160*  ALKPHOS 121  BILITOT 0.4  PROT 7.4  ALBUMIN 3.3*   No results for input(s): LIPASE, AMYLASE in the last 168 hours. No results for input(s): AMMONIA in the last 168 hours. Coagulation Profile: No results for input(s): INR, PROTIME in the last 168 hours. Cardiac Enzymes: No results for input(s): CKTOTAL, CKMB, CKMBINDEX, TROPONINI in the last 168 hours. BNP (last 3 results) No results for input(s): PROBNP in the last 8760  hours. HbA1C: No results for input(s): HGBA1C in the last 72 hours. CBG: Recent Labs  Lab 02/19/20 0715 02/19/20 1216 02/19/20 1600 02/19/20 2136 02/20/20 0619  GLUCAP 113* 148* 95 112* 116*   Lipid Profile: No results for input(s): CHOL, HDL, LDLCALC, TRIG, CHOLHDL, LDLDIRECT in the last 72 hours. Thyroid Function Tests: No results for input(s): TSH, T4TOTAL, FREET4, T3FREE, THYROIDAB in the last 72 hours. Anemia Panel: No results for input(s): VITAMINB12, FOLATE, FERRITIN, TIBC, IRON, RETICCTPCT in the last 72 hours. Sepsis Labs: No results for input(s): PROCALCITON, LATICACIDVEN in the last 168 hours.  No results found for this or any previous visit (from the past 240 hour(s)).       Radiology Studies: No results found.      Scheduled Meds: . amantadine  100 mg Per Tube BID  . Chlorhexidine Gluconate Cloth  6 each Topical Daily  . famotidine  20 mg Per Tube BID  . feeding supplement  237 mL Per Tube BID BM  . feeding supplement (OSMOLITE 1.2 CAL)  780 mL Per Tube QHS  . feeding supplement (PROSource TF)  45 mL Per Tube BID  . free water  200 mL Per Tube Q6H  . lacosamide  200 mg Per Tube BID  . PHENObarbital  60 mg Per Tube BID  . phenytoin  200 mg Per Tube BID  . polyethylene glycol  17 g Per Tube Daily  . potassium chloride  40 mEq Per Tube Daily  . vitamin B-6  100 mg Per Tube Daily  . QUEtiapine  12.5 mg Per Tube QHS  . senna  1 tablet Per Tube QHS  . sertraline  37.5 mg Per Tube Daily  . thiamine  100 mg Per Tube Daily   Continuous Infusions:   LOS: 46 days   Time spent= 15 mins    Anita Davis Anita Maxcy, MD [facility]  If 7PM-7AM, please contact night-coverage  02/21/2020, 8:28 AM

## 2020-02-22 ENCOUNTER — Inpatient Hospital Stay (HOSPITAL_COMMUNITY): Payer: Medicaid Other

## 2020-02-22 LAB — GLUCOSE, CAPILLARY
Glucose-Capillary: 115 mg/dL — ABNORMAL HIGH (ref 70–99)
Glucose-Capillary: 155 mg/dL — ABNORMAL HIGH (ref 70–99)

## 2020-02-22 NOTE — Progress Notes (Signed)
Physical Therapy Treatment [name] MRN: [id number] DOB: January 22, 1986 Today's Date: 02/22/2020    History of Present Illness 34 year old with past medical history significant for hepatitis C, polysubstance abuse brought to the hospital 9/13 for disorientation which was suspected to be substance abuse.  After she became more lucid, she was discharged, but then police brought her back later in the day and they found her passed out in a parking lot.  Urine drug screen was positive for benzodiazepine and THC. An EEG done on 9/15 showed patient was in status epilepticus.  Patient was a started on Keppra.  Despite these, EEG noted continued seizures requiring additional medications as well.  Finally on 918, seizures broke. Follow-up EEG on 9/20 noted evidence of epileptic [name] from the left central temporal region.  Repeat EEG done on 9/22 noted epileptogenic city from the left central temporal region.  Pt with PEG placed on 02/05/20.    PT Comments    Pt did not speak for duration of session, but appears somewhat restless and anxious with mobility. Pt ambulated x2 room distances with seated rest breaks as needed, pt noted to whimper and go rigid in trunk when requesting to stop walking. Pt with no command following this day, so required increased time and physical assist vs previous sessions. PT to continue to follow acutely.     Follow Up Recommendations  SNF;Supervision/Assistance - 24 hour     Equipment Recommendations  Wheelchair (measurements PT);Wheelchair cushion (measurements PT);Hospital bed;Other (comment);3in1 (PT)    Recommendations for Other Services       Precautions / Restrictions Precautions Precautions: Fall Precaution Comments: Seizure, PEG Restrictions Weight Bearing Restrictions: No    Mobility  Bed Mobility Overal bed mobility: Needs Assistance Bed Mobility: Supine to Sit     Supine to sit: Max assist     General bed mobility  comments: assist for trunk and LE management, scooting to EOB.  Transfers Overall transfer level: Needs assistance Equipment used: 2 person hand held assist Transfers: Sit to/from Stand Sit to Stand: Mod assist;+2 physical assistance         General transfer comment: Mod +2 for initial power up, steadying. STS x2, from EOB and recliner  Ambulation/Gait Ambulation/Gait assistance: Mod assist;+2 safety/equipment;+2 physical assistance Gait Distance (Feet): 20 Feet (+20) Assistive device: 2 person hand held assist;1 person hand held assist Gait Pattern/deviations: Drifts right/left;Ataxic;Shuffle Gait velocity: decr   General Gait Details: Mod assist +1-2 to steady, guide pt hips, physically assist pt in weightshifting laterally to take steps. Seated rest break x1, pt going rigid in trunk and making a whimpering noise indicating she needed a rest.   Stairs             Wheelchair Mobility    Modified Rankin (Stroke Patients Only)       Balance Overall balance assessment: Needs assistance Sitting-balance support: Feet supported;Bilateral upper extremity supported Sitting balance-Leahy Scale: Fair Sitting balance - Comments: able to sit EOB without PT support, x1 minute Postural control: Posterior lean Standing balance support: Bilateral upper extremity supported Standing balance-Leahy Scale: Poor Standing balance comment: reliant on external assist                            Cognition Arousal/Alertness: Awake/alert Behavior During Therapy: Flat affect;Anxious Overall Cognitive Status: Impaired/Different from baseline Area of Impairment: Following commands;Safety/judgement;Awareness;Problem solving  Current Attention Level: Sustained   Following Commands: Follows one step commands inconsistently Safety/Judgement: Decreased awareness of safety;Decreased awareness of deficits Awareness: Intellectual Problem Solving: Slow  processing;Decreased initiation;Difficulty sequencing;Requires verbal cues;Requires tactile cues General Comments: Pt following 0% of commands today. Pt did not speak for duration of session. Pt spitting throughout session, sometimes reaching for rag to wipe and other times not. During gait, pt whimpering and going stiff, requiring intermittent seated rest breaks.      Exercises      General Comments        Pertinent Vitals/Pain Pain Assessment: Faces Faces Pain Scale: Hurts a little bit Pain Location: generalized Pain Descriptors / Indicators: Grimacing Pain Intervention(s): Limited activity within patient's tolerance;Monitored during session    Home Living                      Prior Function            PT Goals (current goals can now be found in the care plan section) Acute Rehab PT Goals Patient Stated Goal: unable to state PT Goal Formulation: Patient unable to participate in goal setting Potential to Achieve Goals: Fair Progress towards PT goals: Progressing toward goals    Frequency    Min 2X/week      PT Plan Current plan remains appropriate    Co-evaluation              AM-PAC PT "6 Clicks" Mobility   Outcome Measure  Help needed turning from your back to your side while in a flat bed without using bedrails?: A Lot Help needed moving from lying on your back to sitting on the side of a flat bed without using bedrails?: A Lot Help needed moving to and from a bed to a chair (including a wheelchair)?: A Lot Help needed standing up from a chair using your arms (e.g., wheelchair or bedside chair)?: A Lot Help needed to walk in hospital room?: A Lot Help needed climbing 3-5 steps with a railing? : Total 6 Click Score: 11    End of Session Equipment Utilized During Treatment: Gait belt Activity Tolerance: Patient tolerated treatment well Patient left: in chair;with call bell/phone within reach;with chair alarm set (posey belt chair alarm) Nurse  Communication: Mobility status PT Visit Diagnosis: Unsteadiness on feet (R26.81);Muscle weakness (generalized) (M62.81);Difficulty in walking, not elsewhere classified (R26.2);Other symptoms and signs involving the nervous system (R29.898)     Time: 9485-4627 PT Time Calculation (min) (ACUTE ONLY): 34 min  Charges:  $Gait Training: 8-22 mins $Therapeutic Activity: 8-22 mins                     Schyler Counsell E, PT Acute Rehabilitation Services Pager [phone]  Office [phone]     Dede Dobesh D Despina Hidden 02/22/2020, 5:09 PM

## 2020-02-22 NOTE — Progress Notes (Signed)
PROGRESS NOTE    Anita Davis  EHM:[id number] DOB: 1986-01-20 DOA: 01/04/2020 PCP: [name], PA-C   Brief Narrative:  34 year old with history of hep C, substance abuse disorder, gestational diabetes admitted for confusion.  Prolonged hospital stay for encephalopathy, focal seizures and poor neurologic recovery.  Concerns for autoimmune encephalitis therefore he received IVIG.  Slowly improving at this time.  At this time awaiting placement.   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  Physical deconditioning -PT recommending SNF, awaiting placement.  Suspected chronic depression in context of ongoing polysubstance abuse prior to admission -Poor social support.  Dislodged gastrostomy tube -Placed by IR 10/21.  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  AXR ordered.   Nonobstructive transaminitis/hepatitis C -Follow LFTs for now.  Nutrition Status/moderate to severe protein calorie malnutrition: -Feeding tube currently in place   DVT prophylaxis: Lovenox Code Status: Full Family Communication: None   Dispo: The patient is from: Home              Anticipated d/c is to: SNF              Anticipated d/c date is: > 3 days              Patient currently is medically stable to d/c.  Unsafe discharge.  Due to placement issues.   Body mass index is 26.6 kg/m.        Subjective: Nursing concern about possible malfunction therefore IR requested for eval.   Patient is awake but doesn't not participate in any meaningful conversation. Tracks me across the room.   Review of Systems Otherwise negative except as per HPI, including: No meaningful interaction with not in acute distress  Examination: Constitutional: Not in acute distress, nonverbal/[place] Respiratory: Clear to auscultation bilaterally Cardiovascular: Normal sinus rhythm, no rubs Abdomen: Nontender nondistended good bowel sounds Musculoskeletal: No edema noted Skin: No rashes seen Neurologic: grossly moving around iwthout any issues.  Psychiatric: unable to assess.  Objective: Vitals:   02/21/20 2336 02/22/20 0416 02/22/20 0417 02/22/20 0830  BP: 116/87 119/78  109/75  Pulse: 73 92  79  Resp: 16 20  18   Temp: 97.6 F (36.4 C) 97.7 F (36.5 C)  97.7 F (36.5 C)  TempSrc: Oral Oral  Oral  SpO2: 100% 99%  99%  Weight:   72.5 kg   Height:       No intake or output data in the 24 hours ending 02/22/20 0914 Filed Weights   02/20/20 0500 02/21/20 0430 02/22/20 0417  Weight: 72.7 kg 79.9 kg 72.5 kg     Data Reviewed:   CBC: No results for input(s): WBC, NEUTROABS, HGB, HCT, MCV, PLT in the last 168 hours. Basic Metabolic Panel: No results for input(s): NA, K, CL, CO2, GLUCOSE, BUN, CREATININE, CALCIUM, MG, PHOS in the last 168 hours. GFR: Estimated Creatinine Clearance: 97 mL/min (by C-G formula based on SCr of 0.52 mg/dL). Liver Function Tests: No results  for input(s): AST, ALT, ALKPHOS, BILITOT, PROT, ALBUMIN in the last 168 hours. No results for input(s): LIPASE, AMYLASE in the last 168 hours. No results for input(s): AMMONIA in the last 168 hours. Coagulation Profile: No results for input(s): INR, PROTIME in the last 168 hours. Cardiac Enzymes: No results for input(s): CKTOTAL, CKMB, CKMBINDEX, TROPONINI in the last 168 hours. BNP (last 3 results) No results for  input(s): PROBNP in the last 8760 hours. HbA1C: No results for input(s): HGBA1C in the last 72 hours. CBG: Recent Labs  Lab 02/19/20 2136 02/20/20 0619 02/21/20 1310 02/21/20 1530 02/22/20 0419  GLUCAP 112* 116* 130* 71 115*   Lipid Profile: No results for input(s): CHOL, HDL, LDLCALC, TRIG, CHOLHDL, LDLDIRECT in the last 72 hours. Thyroid Function Tests: No results for input(s): TSH, T4TOTAL, FREET4, T3FREE, THYROIDAB in the last 72 hours. Anemia Panel: No results for input(s): VITAMINB12, FOLATE, FERRITIN, TIBC, IRON, RETICCTPCT in the last 72 hours. Sepsis Labs: No results for input(s): PROCALCITON, LATICACIDVEN in the last 168 hours.  No results found for this or any previous visit (from the past 240 hour(s)).       Radiology Studies: No results found.      Scheduled Meds: . amantadine  100 mg Per Tube BID  . Chlorhexidine Gluconate Cloth  6 each Topical Daily  . famotidine  20 mg Per Tube BID  . feeding supplement  237 mL Per Tube BID BM  . feeding supplement (OSMOLITE 1.2 CAL)  780 mL Per Tube QHS  . feeding supplement (PROSource TF)  45 mL Per Tube BID  . free water  200 mL Per Tube Q6H  . lacosamide  200 mg Per Tube BID  . PHENObarbital  60 mg Per Tube BID  . phenytoin  200 mg Per Tube BID  . polyethylene glycol  17 g Per Tube Daily  . potassium chloride  40 mEq Per Tube Daily  . vitamin B-6  100 mg Per Tube Daily  . QUEtiapine  12.5 mg Per Tube QHS  . senna  1 tablet Per Tube QHS  . sertraline  37.5 mg Per Tube Daily  . thiamine  100 mg Per Tube Daily   Continuous Infusions:   LOS: 47 days   Time spent= 15 mins    Blossie Raffel Joline Maxcy, MD [facility]  If 7PM-7AM, please contact night-coverage  02/22/2020, 9:14 AM

## 2020-02-22 NOTE — Progress Notes (Signed)
IR received request to evaluate gastrostomy tube for possible malpositioning. Abdominal x-ray ordered by Dr. Nelson Chimes shows tube to be in proper position but an x-ray with contrast can be ordered for more definite confirmation. Dr. Nelson Chimes states he will have the RN resume using the tube and will order an x-ray with contrast if there are further issues. No IR intervention planned at this point.   Please contact IR with any questions.  Alwyn Ren, [place] [phone] 02/22/2020, 12:39 PM

## 2020-02-23 ENCOUNTER — Inpatient Hospital Stay: Payer: Medicaid Other

## 2020-02-23 DIAGNOSIS — Z23 Encounter for immunization: Secondary | ICD-10-CM

## 2020-02-23 LAB — GLUCOSE, CAPILLARY
Glucose-Capillary: 129 mg/dL — ABNORMAL HIGH (ref 70–99)
Glucose-Capillary: 131 mg/dL — ABNORMAL HIGH (ref 70–99)
Glucose-Capillary: 142 mg/dL — ABNORMAL HIGH (ref 70–99)
Glucose-Capillary: 148 mg/dL — ABNORMAL HIGH (ref 70–99)
Glucose-Capillary: 150 mg/dL — ABNORMAL HIGH (ref 70–99)
Glucose-Capillary: 166 mg/dL — ABNORMAL HIGH (ref 70–99)

## 2020-02-23 MED ORDER — OSMOLITE 1.2 CAL PO LIQD
1000.0000 mL | ORAL | Status: DC
  Administered 2020-02-23 – 2020-02-26 (×4): 1000 mL
  Filled 2020-02-23 (×4): qty 1000

## 2020-02-23 MED ORDER — PROSOURCE TF PO LIQD
45.0000 mL | Freq: Every day | ORAL | Status: DC
  Administered 2020-02-24 – 2020-02-26 (×3): 45 mL
  Filled 2020-02-23 (×3): qty 45

## 2020-02-23 NOTE — Progress Notes (Signed)
Nutrition Follow-up  DOCUMENTATION CODES:   Not applicable  INTERVENTION:  D/c Ensure   Transition pt back to continuous feeding via G-tube: -Osmolite 1.2 cal @ 71ml/hr -55ml Prosource TF daily -free water per MD, currently Q6H  Provides1912 kcals, 97 grams of protein, and free water(2071ml total free water with flushes)   NUTRITION DIAGNOSIS:   Inadequate oral intake related to lethargy/confusion as evidenced by meal completion < 25%.  ongoing  GOAL:   Patient will meet greater than or equal to 90% of their needs  Will address with TF  MONITOR:   TF tolerance, Labs, Weight trends, I & O's  REASON FOR ASSESSMENT:   Consult Enteral/tube feeding initiation and management  ASSESSMENT:   Pt with refractory seizures with acute metabolic encephalopathy. PMH includes hepatitis and polysubstance abuse.  9/24 Cortrak placed (gastric) 10/10 Cortrak dislodged, TF held 10/11 Cortrak advanced (tip in proximal duodenum per KUB), TF restarted 10/14 TF reduced to 52ml/hr due to colonic ileus 10/15 G-tube placement 10/16 TF resumed 10/18 TF rate returned to 57ml/hr; G-tube pulled out 1 inch so TF held 10/21 G-tube replaced 10/27 diet advanced to regular  Pt unable to participate in any meaningful conversation today.   Only 2 meals documented since last RD assessment and since pt was transitioned to nocturnal TF. Meals charted as 30% and 5% completed. Discussed pt with RN who reports pt is barely eating and is not consuming supplements. Current TF regimen is Osmolite 1.2 cal @ 38ml/hr from 1800-0600 with 7ml Prosource TF BID and free water Q6H. This meets only 56% of the pt's minimum calorie needs. Since pt's mentation has not improved and pt has had minimal po intake, will transition pt back to continuous feeding. Will consider transitioning pt to bolus feeding at follow-up.  Admit wt: 74.8 kg Current wt: 72.9 kg  Labs: CBGs 131-148 Medications:  pepcid, miralax, KCl , vitamin B6, senokot, thiamine, Ensure enlive BID  Diet Order:   Diet Order            Diet regular Room service appropriate? Yes; Fluid consistency: Thin  Diet effective now                 EDUCATION NEEDS:   No education needs have been identified at this time  Skin:  Skin Assessment: Skin Integrity Issues: Skin Integrity Issues:: Incisions Incisions: abdomen  Last BM:  10/31  Height:   Ht Readings from Last 1 Encounters:  01/06/20 5\' 5"  (1.651 m)    Weight:   Wt Readings from Last 1 Encounters:  02/23/20 72.9 kg   BMI:  Body mass index is 26.74 kg/m.  Estimated Nutritional Needs:   Kcal:  1800-2000  Protein:  90-100 grams  Fluid:  >/=1.8L/d    13/02/21, MS, RD, LDN RD pager number and weekend/on-call pager number located in [place].

## 2020-02-23 NOTE — Progress Notes (Signed)
02/23/2020   Covid-19 Vaccination Clinic  Name:  Dennys Traughber    MRN: [id number] DOB: 1985-07-15  02/23/2020  Ms. Sweeney was observed post Covid-19 immunization for 15 minuteswithout incident. She was provided with Vaccine Information Sheet and instruction to access the V-Safe system.   Ms. Haskins was instructed to call 911 with any severe reactions post vaccine: Marland Kitchen Difficulty breathing  . Swelling of face and throat  . A fast heartbeat  . A bad rash all over body  . Dizziness and weakness   Immunizations Administered    Name Date Dose VIS Date Route   Pfizer COVID-19 Vaccine 02/23/2020 10:38 AM 0.3 mL 02/10/2020 Intramuscular   Manufacturer: [employer], [employer]   Lot: [id number]   NDC: [address]-3    2nd does Pfizer consented by pt's aunt who has POA and provider [name], NP.  Dorothea Glassman, RN-BSN-CCM

## 2020-02-23 NOTE — Progress Notes (Addendum)
[facility] PROGRESS NOTE  Anita Davis TIR:[id number] DOB: December 14, 1985 DOA: 01/04/2020 PCP: [name], PA-C     10/20 nonverbal and not making eye contact or following simple commands     10/25: Anita Davis much more alert today.  Eventually she was able to talk and express her needs.  Subsequently she was rolled out to the nurses station to continue to improve her socialization.  She was requesting a Covid vaccine.   Status: Inpatient-Remains inpatient appropriate because:Altered mental status, Ongoing diagnostic testing needed not appropriate for outpatient work up and Unsafe d/c plan -plan for PEG tube placement on 10/5   Dispo: The patient is from: Home              Anticipated d/c is to: SNF              Anticipated d/c date is: > 3 days              Patient currently is medically stable to d/c.  Due to UNSAFE DC PLAN-patient needs placement in skilled nursing facility based on altered mentation and inability to manage self-care.  She currently requires 24/7 care due to delay from presumptive diagnosis of autoimmune encephalitis.  Due to inconsistent alertness patient will continue G-tube for feedings and medications   Code Status: Full Family Communication: Updated Anita Davis "[address]" [name] by voicemail on 10/25; 11/2 patient's mother and aunt at bedside today and updated on condition DVT prophylaxis: Lovenox Vaccination status: [name] confirmed pt given 1st Pfizer COVID vaccine in June. 2nd vaccine given 11/2  HPI: 34 y.o. F with hx substance use disorder, active, hepatitis C, and gestational diabetes who presented with confusion.  Patient initially admitted in September with encephalopathy, thought to be from drug overdose.  LP showed no signs of infection. MRI brain unremarkable. She remained encephalopathic, and EEG showed focal seizures.  The seizures were suppressed, and she was transferred to [facility] for continuous EEG, but after seizure control, she had  poor neurologic recovery.  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  She deteriorated after steroids were stopped, so IVIG was started.  Subjective: Awake and alert.  Nonverbal.  Does not follow commands.  Does track persons in room with eyes.  Of note, patient was able to ambulate with PT on 11/1 although was quite impulsive and became agitated when gait was stopped to allow for turning or sitting.  Objective: Vitals:   02/23/20 0858 02/23/20 1115  BP: 120/84 117/86  Pulse: 78 79  Resp: 16 18  Temp: 97.8 F (36.6 C) 99 F (37.2 C)  SpO2: 99% 99%    Intake/Output Summary (Last 24 hours) at 02/23/2020 1233 Last data filed at 02/23/2020 1000 Gross per 24 hour  Intake 0 ml  Output --  Net 0 ml   Filed Weights   02/21/20 0430 02/22/20 0417 02/23/20 0500  Weight: 79.9 kg 72.5 kg 72.9 kg    Exam: Constitutional: NAD, awake, flat affect, nonverbal Respiratory: clear to auscultation bilaterally, Normal respiratory effort. No accessory muscle use.  Room air Cardiovascular: Regular rate and rhythm. No extremity edema.    Abdomen: no tenderness palpation,G-tube in place.   Musculoskeletal: no clubbing / cyanosis. No contractures.  No tremors or spasticity  Neurologic: CN 2-12 grossly intact, weakly moves all extremities spontaneously but unable to get her to follow commands to demonstrate purposeful activity; strength upper and lower extremities 4/5. Sensation intact.  No focal neurological deficits  appreciated. Psychiatric: Awake, remains nonverbal today so unable to determine orientation.   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  Initially treated as autoimmune encephalitis but despite appropriate  treatment including IVIG no significant improvement in symptoms. -At this juncture it is suspected etiology related to sequelae from seizure activity that began prior to admission and persisted in the form of status epilepticus after admission -Continue phenobarbital, Vimpat, and Dilantin -Continue empiric thiamine and vitamin B6 replacement -Seroquel initiated due to paranoid behaviors (primarily verbal) and behaviors concerning for possible hallucinations.  Daytime Seroquel oversedated.  Currently patient has become nonverbal.  Changed to nocturnal dosing and despite low-dose no improvement in verbal communication therefore as of 11/2 will discontinue Seroquel and monitor -10/28 behavior concerning for possible hallucinations  Physical deconditioning -Working with PT and OT-motivated by food (use of chocolate) -Continues to require extensive assist for all ADLs with recommendation for SNF -PT notes significant increase in ambulation distance as of 10/27.  Unfortunately since that time patient has again become less interactive and nonverbal inability to consistently participate with PT  Suspected chronic depression in context of ongoing polysubstance abuse prior to admission -History obtained from family that patient has been demonstrating symptoms of depression for several months that had worsened just prior to presentation -Given waxing and waning mental status suspect a combination of recent neurological insult as well as severe depression contributing to mentation issues -Continue Zoloft with 37.5 mg daily  Dislodged gastrostomy tube -Placed by IR on 10/21  Dysphagia (resolved) with persistent inadequate oral intake - SLP recommended regular diet with thin liquids but with waxing and waning alertness I have continued intermittent tube feedings given inconsistent intake of solid diet -Continue nocturnal tube feedings-if patient remains awake during the day can transition to bolus tube  feedings based on amount of food eaten versus complete discontinuation of tube feedings if patient consumes at least 50% of meals consistently  Nonobstructive transaminitis/hepatitis C -Elevated HCV antibody with markedly elevated HCV RNA quantitative level  consistent with chronic hepatitis C  -10/25 AST and ALT greater than 100 but total bilirubin normal  Nutrition Status/moderate to severe protein calorie malnutrition: Nutrition Problem: Inadequate oral intake Etiology: lethargy/confusion Signs/Symptoms: meal completion < 25% Interventions: Tube feeding, Prostat Estimated body mass index is 26.74 kg/m as calculated from the following:   Height as of this encounter: 5\' 5"  (1.651 m).   Weight as of this encounter: 72.9 kg.     Data Reviewed: Basic Metabolic Panel: No results for input(s): NA, K, CL, CO2, GLUCOSE, BUN, CREATININE, CALCIUM, MG, PHOS in the last 168 hours. Liver Function Tests: No results for input(s): AST, ALT, ALKPHOS, BILITOT, PROT, ALBUMIN in the last 168 hours. No results for input(s): LIPASE, AMYLASE in the last 168 hours. No results for input(s): AMMONIA in the last 168 hours. CBC: No results for input(s): WBC, NEUTROABS, HGB, HCT, MCV, PLT in the last 168 hours. Cardiac Enzymes: No results for input(s): CKTOTAL, CKMB, CKMBINDEX, TROPONINI in the last 168 hours. BNP (last 3 results) No results for input(s): BNP in the last 8760 hours.  ProBNP (last 3 results) No results for input(s): PROBNP in the last 8760 hours.  CBG: Recent Labs  Lab 02/22/20 0419 02/22/20 2209 02/23/20 0019 02/23/20 0314 02/23/20 1113  GLUCAP 115* 155* 131* 148* 166*    No results found for this or any previous visit (  from the past 240 hour(s)).   Studies: DG Abd 1 View  Result Date: 02/22/2020 CLINICAL DATA:  Abdominal distension EXAM: ABDOMEN - 1 VIEW COMPARISON:  02/16/2020 FINDINGS: Gastrostomy tube overlying the stomach. Nonobstructive bowel gas pattern. Visualized  osseous structures are within normal limits. IMPRESSION: Gastrostomy tube overlying the stomach. Electronically Signed   By: Charline BillsSriyesh  Krishnan M.D.   On: 02/22/2020 11:18    Scheduled Meds: . amantadine  100 mg Per Tube BID  . Chlorhexidine Gluconate Cloth  6 each Topical Daily  . famotidine  20 mg Per Tube BID  . feeding supplement  237 mL Per Tube BID BM  . feeding supplement (OSMOLITE 1.2 CAL)  780 mL Per Tube QHS  . feeding supplement (PROSource TF)  45 mL Per Tube BID  . free water  200 mL Per Tube Q6H  . lacosamide  200 mg Per Tube BID  . PHENObarbital  60 mg Per Tube BID  . phenytoin  200 mg Per Tube BID  . polyethylene glycol  17 g Per Tube Daily  . potassium chloride  40 mEq Per Tube Daily  . vitamin B-6  100 mg Per Tube Daily  . senna  1 tablet Per Tube QHS  . sertraline  37.5 mg Per Tube Daily  . thiamine  100 mg Per Tube Daily   Continuous Infusions:   Principal Problem:   Refractory seizure (HCC) Active Problems:   Acute metabolic encephalopathy   Hypokalemia   Polysubstance abuse (HCC)   Elevated CK   Transaminitis   Chronic hepatitis C without hepatic coma (HCC)   Distended abdomen   Palliative care encounter   Colonic Ileus [facility](HCC)   Consultants:  Neurology  Psychiatry  Interventional radiology  Surgery  Procedures:  9/14 lumbar puncture  9/15 EEG  9/16 EEG  9/17 EEG  9/19 overnight EEG with video  9/22 overnight EEG with video with discontinuation of long-term EEG monitoring on 9/25  9/24 core track placement  10/6 EEG  Antibiotics: Anti-infectives (From admission, onward)   Start     Dose/Rate Route Frequency Ordered Stop   02/11/20 1526  ceFAZolin (ANCEF) IVPB 2g/100 mL premix        2 g 200 mL/hr over 30 Minutes Intravenous  Once 02/11/20 1526 02/11/20 1641   02/05/20 0600  ceFAZolin (ANCEF) IVPB 2g/100 mL premix        2 g 200 mL/hr over 30 Minutes Intravenous To Short Stay 02/04/20 1054 02/05/20 0950   01/29/20 0600   ceFAZolin (ANCEF) IVPB 2g/100 mL premix        2 g 200 mL/hr over 30 Minutes Intravenous On call to O.R. 01/28/20 1511 01/30/20 0559   01/28/20 2000  cefTRIAXone (ROCEPHIN) 1 g in sodium chloride 0.9 % 100 mL IVPB        1 g 200 mL/hr over 30 Minutes Intravenous Every 24 hours 01/28/20 1925 02/02/20 0724   01/06/20 0900  cefTRIAXone (ROCEPHIN) 2 g in sodium chloride 0.9 % 100 mL IVPB  Status:  Discontinued        2 g 200 mL/hr over 30 Minutes Intravenous Every 12 hours 01/06/20 0105 01/06/20 2202   01/06/20 0800  vancomycin (VANCOCIN) IVPB 1000 mg/200 mL premix  Status:  Discontinued       "Followed by" Linked Group Details   1,000 mg 200 mL/hr over 60 Minutes Intravenous Every 12 hours 01/05/20 1904 01/06/20 2202   01/05/20 2000  vancomycin (VANCOREADY) IVPB 1500 mg/300 mL       "  Followed by" Linked Group Details   1,500 mg 150 mL/hr over 120 Minutes Intravenous  Once 01/05/20 1904 01/06/20 0127   01/05/20 1830  cefTRIAXone (ROCEPHIN) 2 g in sodium chloride 0.9 % 100 mL IVPB        2 g 200 mL/hr over 30 Minutes Intravenous  Once 01/05/20 1829 01/05/20 2220       Time spent: 20    [name] ANP  [facility] Pager [phone]. If 7PM-7AM, please contact night-coverage at [email or web address] 02/23/2020, 12:33 PM  LOS: 48 days

## 2020-02-24 LAB — GLUCOSE, CAPILLARY
Glucose-Capillary: 125 mg/dL — ABNORMAL HIGH (ref 70–99)
Glucose-Capillary: 152 mg/dL — ABNORMAL HIGH (ref 70–99)
Glucose-Capillary: 211 mg/dL — ABNORMAL HIGH (ref 70–99)

## 2020-02-24 NOTE — Progress Notes (Signed)
Occupational Therapy Treatment [name] MRN: [id number] DOB: April 14, 1986 Today's Date: 02/24/2020    History of present illness 34 year old with past medical history significant for hepatitis C, polysubstance abuse brought to the hospital 9/13 for disorientation which was suspected to be substance abuse.  After she became more lucid, she was discharged, but then police brought her back later in the day and they found her passed out in a parking lot.  Urine drug screen was positive for benzodiazepine and THC. An EEG done on 9/15 showed patient was in status epilepticus.  Patient was a started on Keppra.  Despite these, EEG noted continued seizures requiring additional medications as well.  Finally on 918, seizures broke. Follow-up EEG on 9/20 noted evidence of epileptic [name] from the left central temporal region.  Repeat EEG done on 9/22 noted epileptogenic city from the left central temporal region.  Pt with PEG placed on 02/05/20.   OT comments  Pt initiated a two step command this session with motivation to get oob to chair. Pt also demonstrated increased desire for po intake at RN station vs room with just OT. Pt did not verbalize at all during session making future cognitive assessment difficult so functional assessment required. Recommend SNf for d/c planning.    Follow Up Recommendations  SNF    Equipment Recommendations  3 in 1 bedside commode;Wheelchair (measurements OT);Wheelchair cushion (measurements OT);Hospital bed    Recommendations for Other Services Speech consult    Precautions / Restrictions Precautions Precautions: Fall Precaution Comments: Seizure, PEG abdominal binder Restrictions Weight Bearing Restrictions: No       Mobility Bed Mobility Overal bed mobility: Needs Assistance Bed Mobility: Supine to Sit     Supine to sit: Min assist     General bed mobility comments: pt with hand over hand to place hand on bed rail after pt had  ainitiated. pt able to come to eob with min (A) of pad for hip. Pt static sitting min guard (A)   Transfers Overall transfer level: Needs assistance Equipment used: Rolling walker (2 wheeled);1 person hand held assist Transfers: Sit to/from Stand Sit to Stand: Min assist         General transfer comment: pt initiated and started transfer toward chair that was provided  ( we are going to stand up and walk to the chair) showing 2 step command following    Balance Overall balance assessment: Needs assistance Sitting-balance support: Single extremity supported;Feet supported Sitting balance-Leahy Scale: Fair Sitting balance - Comments: minG with unilateral UE support of chair   Standing balance support: Bilateral upper extremity supported Standing balance-Leahy Scale: Poor Standing balance comment: minA with BUE support of RW, anterior lean                           ADL either performed or assessed with clinical judgement   ADL Overall ADL's : Needs assistance/impaired Eating/Feeding: Set up;Sitting Eating/Feeding Details (indicate cue type and reason): pt takes fork to mouth on command moves tongue laterally back and forth then wipes with cloth to remove food from mouth. pt spiitting alot this session into a cloth. mouth was wiped down and provided fluids to entire nothing on the tongue ( such as a hair)                     Toilet Transfer: Minimal assistance;RW Toilet Transfer Details (indicate cue type and reason): simulated eob to chair  Functional mobility during ADLs: Minimal assistance;Rolling walker General ADL Comments: pt initiated bed mobility and progressed to chair with command showing understanding     Vision       Perception     Praxis      Cognition Arousal/Alertness: Awake/alert Behavior During Therapy: Flat affect Overall Cognitive Status: Impaired/Different from baseline Area of Impairment: Attention;Following  commands;Safety/judgement;Problem solving;Awareness                   Current Attention Level: Focused   Following Commands: Follows one step commands inconsistently Safety/Judgement: Decreased awareness of safety;Decreased awareness of deficits Awareness: Intellectual Problem Solving: Slow processing;Requires verbal cues;Difficulty sequencing;Decreased initiation;Requires tactile cues General Comments: pt doesnot verbalize the entire session. pt follows simple command and initiates bed mobility. pt initiates self feeding but then pulls the food from mouth. pt does not eat in room but when placed at RN staff drinks from a cup soda        Exercises     Shoulder Instructions       General Comments pt grimacing at times during session, appears to fatigue with ambulation although pt is unable to verbally communicate this.    Pertinent Vitals/ Pain       Pain Assessment: No/denies pain Faces Pain Scale: Hurts little more Pain Location: generalized Pain Descriptors / Indicators: Grimacing Pain Intervention(s): Monitored during session  Home Living                                          Prior Functioning/Environment              Frequency  Min 2X/week        Progress Toward Goals  OT Goals(current goals can now be found in the care plan section)  Progress towards OT goals: Progressing toward goals  Acute Rehab OT Goals Patient Stated Goal: unable to state OT Goal Formulation: Patient unable to participate in goal setting Time For Goal Achievement: 03/09/20 Potential to Achieve Goals: Good ADL Goals Pt Will Perform Eating: with min guard assist;sitting Pt Will Perform Grooming: with min guard assist;sitting Additional ADL Goal #1: pt will demonstrate 2 greeting gestures to staff appropriately mod I   Plan Discharge plan remains appropriate    Co-evaluation                 AM-PAC OT "6 Clicks" Daily Activity     Outcome  Measure   Help from another person eating meals?: A Lot Help from another person taking care of personal grooming?: A Lot Help from another person toileting, which includes using toliet, bedpan, or urinal?: A Lot Help from another person bathing (including washing, rinsing, drying)?: A Lot Help from another person to put on and taking off regular upper body clothing?: A Lot Help from another person to put on and taking off regular lower body clothing?: A Lot 6 Click Score: 12    End of Session Equipment Utilized During Treatment: Rolling walker  OT Visit Diagnosis: Unsteadiness on feet (R26.81);Muscle weakness (generalized) (M62.81);Pain   Activity Tolerance Patient tolerated treatment well   Patient Left in chair;with call bell/phone within reach;with chair alarm set;Other (comment) (RN station to help with socialization)   Nurse Communication Mobility status;Precautions        Time: [phone] OT Time Calculation (min): 24 min  Charges: OT General Charges $OT Visit: 1 Visit OT Treatments $Self  Care/Home Management : 23-37 mins   Brynn, OTR/L  Acute Rehabilitation Services Pager: [phone] Office: [phone] .    Mateo Flow 02/24/2020, 3:40 PM

## 2020-02-24 NOTE — Progress Notes (Addendum)
[facility] PROGRESS NOTE  Anita Davis BTD:[id number] DOB: December 13, 1985 DOA: 01/04/2020 PCP: [name], PA-C     10/20 nonverbal and not making eye contact or following simple commands     10/25: Anita Davis much more alert today.  Eventually she was able to talk and express her needs.  Subsequently she was rolled out to the nurses station to continue to improve her socialization.  She was requesting a Covid vaccine.   Status: Inpatient-Remains inpatient appropriate because:Altered mental status and Unsafe d/c plan    Dispo: The patient is from: Home              Anticipated d/c is to: SNF              Anticipated d/c date is: > 3 days              Patient currently is medically stable to d/c.  Due to UNSAFE DC PLAN-patient needs placement in skilled nursing facility based on altered mentation and inability to manage self-care.  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 Do not expect recovery to previous baseline  Due to inconsistent alertness and thus inadequate oral intake of foods patient will continue G-tube for feedings and medications   Code Status: Full Family Communication: Updated Anita Davis "[address] [name] by voicemail on 10/25; 11/2 patient's other aunt at bedside today and updated on condition DVT prophylaxis: Lovenox Vaccination status: Anita Davis confirmed pt given [employer] COVID vaccine in June. 2nd vaccine given 11/2  HPI: 34 y.o. F with hx substance use disorder, active, hepatitis C, and gestational diabetes who presented with confusion.  Patient initially admitted in September with encephalopathy, thought to be from drug overdose.  LP showed no signs of infection. MRI brain unremarkable. She remained encephalopathic, and EEG showed focal seizures.  The seizures were suppressed, and she was transferred to [facility] for continuous EEG, but after seizure control, she  had poor neurologic recovery.  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  She deteriorated after steroids were stopped, so IVIG was started.  Subjective: Awake and alert.  Mains nonverbal.  Unable to follow simple commands.  Was looking around the room noticing tech at sink preparing washcloths to cleanse buttocks and perineum after incontinence of stool.  Objective: Vitals:   02/23/20 2344 02/24/20 0851  BP: 122/81 98/83  Pulse: 83 67  Resp: 18 18  Temp: 98 F (36.7 C) 98.4 F (36.9 C)  SpO2: 99% 98%   No intake or output data in the 24 hours ending 02/24/20 1218 Filed Weights   02/22/20 0417 02/23/20 0500 02/24/20 0500  Weight: 72.5 kg 72.9 kg 71.4 kg    Exam: Constitutional: NAD, awake, flat affect, nonverbal Respiratory: clear to auscultation bilaterally, Normal respiratory effort. No accessory muscle use.  Room air Cardiovascular: Regular rate and rhythm. No extremity edema.    Abdomen: no tenderness palpation,G-tube in place.   Musculoskeletal: no clubbing / cyanosis. No contractures.  No tremors or spasticity  Neurologic: CN 2-12 grossly intact, weakly moves all extremities spontaneously but unable to get her to follow commands to demonstrate purposeful activity; strength upper and lower extremities 4/5. Sensation intact.  No focal neurological deficits appreciated. Psychiatric: Awake, remains nonverbal today so unable to determine orientation.   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 Initially treated as autoimmune encephalitis but despite appropriate treatment including IVIG no significant improvement in symptoms. -At this juncture it is suspected etiology related to sequelae from seizure  activity that began prior to admission and persisted in the form of status epilepticus after admission -Continue phenobarbital, Vimpat, and Dilantin -Continue empiric thiamine and vitamin B6 replacement -Seroquel initiated due to paranoid behaviors (primarily verbal expression) and witnessed behaviors concerning for possible hallucinations.  Daytime Seroquel resulted in oversedation. Nonverbal again after Seroquel initiated.  Changed to nocturnal dosing and despite low-dose no improvement in verbal communication therefore 11/2 Seroquel discontinued  Physical deconditioning -Working with PT and OT-motivated by food (use of chocolate) -Continues to require extensive assist for all ADLs with recommendation for SNF -PT notes significant increase in ambulation distance as of 10/27.  Unfortunately since that time patient has again become less interactive and nonverbal inability to consistently participate with PT  Suspected chronic depression in context of ongoing polysubstance abuse prior to admission -History obtained from family that patient has been demonstrating symptoms of depression for several months that had worsened just prior to presentation -Given waxing and waning mental status suspect a combination of recent neurological insult as well as severe depression contributing to mentation issues -Continue Zoloft with 37.5 mg daily  Dislodged gastrostomy tube -Placed by IR on 10/21  Dysphagia (resolved) with persistent inadequate oral intake - SLP recommended regular diet with thin liquids but with waxing and waning alertness I have continued intermittent tube feedings given inconsistent intake of solid diet -Continue nocturnal tube feedings-if patient remains awake during the day can transition to bolus tube feedings based on amount of food eaten versus complete discontinuation of tube feedings if patient consumes at least 50% of meals consistently  Nonobstructive transaminitis/hepatitis  C -Elevated HCV antibody with markedly elevated HCV RNA quantitative level  consistent with chronic hepatitis C  -10/25 AST and ALT greater than 100 but total bilirubin normal  Nutrition Status/moderate to severe protein calorie malnutrition: Nutrition Problem: Inadequate oral intake Etiology: lethargy/confusion Signs/Symptoms: meal completion < 25% Interventions: Tube feeding, Prostat Estimated body mass index is 26.19 kg/m as calculated from the following:   Height as of this encounter: 5\' 5"  (1.651 m).   Weight as of this encounter: 71.4 kg.     Data Reviewed: Basic Metabolic Panel: No results for input(s): NA, K, CL, CO2, GLUCOSE, BUN, CREATININE, CALCIUM, MG, PHOS in the last 168 hours. Liver Function Tests: No results for input(s): AST, ALT, ALKPHOS, BILITOT, PROT, ALBUMIN in the last 168 hours. No results for input(s): LIPASE, AMYLASE in the last 168 hours. No results for input(s): AMMONIA in the last 168 hours. CBC: No results for input(s): WBC, NEUTROABS, HGB, HCT, MCV, PLT in the last 168 hours. Cardiac Enzymes: No results for input(s): CKTOTAL, CKMB, CKMBINDEX, TROPONINI in the last 168 hours. BNP (last 3 results) No results for input(s): BNP in the last 8760 hours.  ProBNP (last 3 results) No results for input(s): PROBNP in the last 8760 hours.  CBG: Recent Labs  Lab 02/23/20 1745 02/23/20 2101 02/23/20 2342 02/24/20 0500 02/24/20 0859  GLUCAP 129* 142* 150* 125* 152*    No results found for this or any previous visit (from the past 240 hour(s)).   Studies: No results found.  Scheduled Meds:  amantadine  100 mg Per Tube BID   Chlorhexidine Gluconate Cloth  6 each Topical Daily   famotidine  20 mg Per Tube BID  feeding supplement (PROSource TF)  45 mL Per Tube Daily   free water  200 mL Per Tube Q6H   lacosamide  200 mg Per Tube BID   PHENObarbital  60 mg Per Tube BID   phenytoin  200 mg Per Tube BID   polyethylene glycol  17 g Per Tube  Daily   potassium chloride  40 mEq Per Tube Daily   vitamin B-6  100 mg Per Tube Daily   senna  1 tablet Per Tube QHS   sertraline  37.5 mg Per Tube Daily   thiamine  100 mg Per Tube Daily   Continuous Infusions:  feeding supplement (OSMOLITE 1.2 CAL) 1,000 mL (02/23/20 1530)    Principal Problem:   Refractory seizure (HCC) Active Problems:   Acute metabolic encephalopathy   Hypokalemia   Polysubstance abuse (HCC)   Elevated CK   Transaminitis   Chronic hepatitis C without hepatic coma (HCC)   Distended abdomen   Palliative care encounter   Colonic Ileus [facility])   Consultants:  Neurology  Psychiatry  Interventional radiology  Surgery  Procedures:  9/14 lumbar puncture  9/15 EEG  9/16 EEG  9/17 EEG  9/19 overnight EEG with video  9/22 overnight EEG with video with discontinuation of long-term EEG monitoring on 9/25  9/24 core track placement  10/6 EEG  Antibiotics: Anti-infectives (From admission, onward)   Start     Dose/Rate Route Frequency Ordered Stop   02/11/20 1526  ceFAZolin (ANCEF) IVPB 2g/100 mL premix        2 g 200 mL/hr over 30 Minutes Intravenous  Once 02/11/20 1526 02/11/20 1641   02/05/20 0600  ceFAZolin (ANCEF) IVPB 2g/100 mL premix        2 g 200 mL/hr over 30 Minutes Intravenous To Short Stay 02/04/20 1054 02/05/20 0950   01/29/20 0600  ceFAZolin (ANCEF) IVPB 2g/100 mL premix        2 g 200 mL/hr over 30 Minutes Intravenous On call to O.R. 01/28/20 1511 01/30/20 0559   01/28/20 2000  cefTRIAXone (ROCEPHIN) 1 g in sodium chloride 0.9 % 100 mL IVPB        1 g 200 mL/hr over 30 Minutes Intravenous Every 24 hours 01/28/20 1925 02/02/20 0724   01/06/20 0900  cefTRIAXone (ROCEPHIN) 2 g in sodium chloride 0.9 % 100 mL IVPB  Status:  Discontinued        2 g 200 mL/hr over 30 Minutes Intravenous Every 12 hours 01/06/20 0105 01/06/20 2202   01/06/20 0800  vancomycin (VANCOCIN) IVPB 1000 mg/200 mL premix  Status:  Discontinued        "Followed by" Linked Group Details   1,000 mg 200 mL/hr over 60 Minutes Intravenous Every 12 hours 01/05/20 1904 01/06/20 2202   01/05/20 2000  vancomycin (VANCOREADY) IVPB 1500 mg/300 mL       "Followed by" Linked Group Details   1,500 mg 150 mL/hr over 120 Minutes Intravenous  Once 01/05/20 1904 01/06/20 0127   01/05/20 1830  cefTRIAXone (ROCEPHIN) 2 g in sodium chloride 0.9 % 100 mL IVPB        2 g 200 mL/hr over 30 Minutes Intravenous  Once 01/05/20 1829 01/05/20 2220       Time spent: 20    [name] ANP  [facility] Pager [phone]. If 7PM-7AM, please contact night-coverage at [email or web address] 02/24/2020, 12:18 PM  LOS: 49 days

## 2020-02-24 NOTE — Progress Notes (Signed)
Physical Therapy Treatment [name] MRN: [id number] DOB: 04/04/1986 Today's Date: 02/24/2020    History of Present Illness 34 year old with past medical history significant for hepatitis C, polysubstance abuse brought to the hospital 9/13 for disorientation which was suspected to be substance abuse.  After she became more lucid, she was discharged, but then police brought her back later in the day and they found her passed out in a parking lot.  Urine drug screen was positive for benzodiazepine and THC. An EEG done on 9/15 showed patient was in status epilepticus.  Patient was a started on Keppra.  Despite these, EEG noted continued seizures requiring additional medications as well.  Finally on 918, seizures broke. Follow-up EEG on 9/20 noted evidence of epileptic [name] from the left central temporal region.  Repeat EEG done on 9/22 noted epileptogenic city from the left central temporal region.  Pt with PEG placed on 02/05/20.    PT Comments    Pt tolerated treatment well, although continuing to demonstrate very poor command following. Pt with impaired use of RW this session and is unable to utilize PT cues for hand placement on RW. Pt continues to require tactile cueing and PT initiation of all mobility tasks throughout session. Pt will benefit from continued acute PT POC to improve transfer quality and to reduce falls risk. PT continues to recommend SNF placement at the time of discharge.   Follow Up Recommendations  SNF;Supervision/Assistance - 24 hour     Equipment Recommendations  Wheelchair (measurements PT);Wheelchair cushion (measurements PT);Hospital bed;Other (comment);3in1 (PT)    Recommendations for Other Services       Precautions / Restrictions Precautions Precautions: Fall Precaution Comments: Seizure, PEG Restrictions Weight Bearing Restrictions: No    Mobility  Bed Mobility                  Transfers Overall transfer level: Needs  assistance Equipment used: Rolling walker (2 wheeled);1 person hand held assist Transfers: Sit to/from Stand Sit to Stand: Max assist         General transfer comment: pt requires PT assistance for hand placement and to initiate transfer, also physical assistance needed to power up into standing  Ambulation/Gait Ambulation/Gait assistance: Mod assist Gait Distance (Feet): 12 Feet Assistive device: Rolling walker (2 wheeled) Gait Pattern/deviations: Trunk flexed;Shuffle Gait velocity: reduced Gait velocity interpretation: <1.31 ft/sec, indicative of household ambulator General Gait Details: pt with short shuffling steps with reduced foot clearance bilaterally. PT attempts to bring RW closer to BOS and adjust hand placement of pt multiple times this session however the pt quickly reverts to flexed trunk and extended arms. PT attempts to provided facilitation of weight shift at pelvis however the pt begins to fight against this.   Stairs             Wheelchair Mobility    Modified Rankin (Stroke Patients Only)       Balance Overall balance assessment: Needs assistance Sitting-balance support: Single extremity supported;Feet supported Sitting balance-Leahy Scale: Poor Sitting balance - Comments: minG with unilateral UE support of chair   Standing balance support: Bilateral upper extremity supported Standing balance-Leahy Scale: Poor Standing balance comment: minA with BUE support of RW, anterior lean                            Cognition Arousal/Alertness: Awake/alert Behavior During Therapy: Flat affect Overall Cognitive Status: Impaired/Different from baseline Area of Impairment: Attention;Following commands;Safety/judgement;Problem solving;Awareness  Current Attention Level: Focused   Following Commands: Follows one step commands inconsistently Safety/Judgement: Decreased awareness of safety;Decreased awareness of  deficits Awareness: Intellectual Problem Solving: Slow processing;Requires verbal cues;Difficulty sequencing;Decreased initiation;Requires tactile cues        Exercises      General Comments General comments (skin integrity, edema, etc.): pt grimacing at times during session, appears to fatigue with ambulation although pt is unable to verbally communicate this.      Pertinent Vitals/Pain Pain Assessment: Faces Faces Pain Scale: Hurts little more Pain Location: generalized Pain Descriptors / Indicators: Grimacing Pain Intervention(s): Monitored during session    Home Living                      Prior Function            PT Goals (current goals can now be found in the care plan section) Acute Rehab PT Goals Patient Stated Goal: unable to state Progress towards PT goals: Not progressing toward goals - comment (limited by poor command following)    Frequency    Min 2X/week      PT Plan Current plan remains appropriate    Co-evaluation              AM-PAC PT "6 Clicks" Mobility   Outcome Measure  Help needed turning from your back to your side while in a flat bed without using bedrails?: A Lot Help needed moving from lying on your back to sitting on the side of a flat bed without using bedrails?: A Lot Help needed moving to and from a bed to a chair (including a wheelchair)?: A Lot Help needed standing up from a chair using your arms (e.g., wheelchair or bedside chair)?: A Lot Help needed to walk in hospital room?: Total Help needed climbing 3-5 steps with a railing? : Total 6 Click Score: 10    End of Session Equipment Utilized During Treatment: Gait belt Activity Tolerance: Patient tolerated treatment well Patient left: in chair;with call bell/phone within reach;with chair alarm set Nurse Communication: Mobility status PT Visit Diagnosis: Unsteadiness on feet (R26.81);Muscle weakness (generalized) (M62.81);Difficulty in walking, not elsewhere  classified (R26.2);Other symptoms and signs involving the nervous system (R29.898)     Time: 0240-9735 PT Time Calculation (min) (ACUTE ONLY): 41 min  Charges:  $Gait Training: 23-37 mins $Therapeutic Activity: 8-22 mins                     Arlyss Gandy, PT, DPT Acute Rehabilitation Pager: [phone]    Arlyss Gandy 02/24/2020, 3:04 PM

## 2020-02-25 DIAGNOSIS — F09 Unspecified mental disorder due to known physiological condition: Secondary | ICD-10-CM

## 2020-02-25 DIAGNOSIS — E46 Unspecified protein-calorie malnutrition: Secondary | ICD-10-CM | POA: Diagnosis not present

## 2020-02-25 DIAGNOSIS — R5381 Other malaise: Secondary | ICD-10-CM | POA: Diagnosis not present

## 2020-02-25 DIAGNOSIS — Z789 Other specified health status: Secondary | ICD-10-CM | POA: Diagnosis not present

## 2020-02-25 DIAGNOSIS — E639 Nutritional deficiency, unspecified: Secondary | ICD-10-CM | POA: Diagnosis not present

## 2020-02-25 NOTE — Progress Notes (Signed)
Occupational Therapy Treatment [name] MRN: [id number] DOB: 07/22/1985 Today's Date: 02/25/2020    History of present illness 34 year old with past medical history significant for hepatitis C, polysubstance abuse brought to the hospital 9/13 for disorientation which was suspected to be substance abuse.  After she became more lucid, she was discharged, but then police brought her back later in the day and they found her passed out in a parking lot.  Urine drug screen was positive for benzodiazepine and THC. An EEG done on 9/15 showed patient was in status epilepticus.  Patient was a started on Keppra.  Despite these, EEG noted continued seizures requiring additional medications as well.  Finally on 918, seizures broke. Follow-up EEG on 9/20 noted evidence of epileptic [name] from the left central temporal region.  Repeat EEG done on 9/22 noted epileptogenic city from the left central temporal region.  Pt with PEG placed on 02/05/20.   OT comments  Patient continues to make steady progress towards goals in skilled OT session. Patient's session encompassed functional mobility, transfers, and further focus on following commands. Pt soiled upon arrival, and able to come into long sitting with initiation from RN and therapist, but unable to participate in dressing or bathing or peri care. Unable to complete backward chaining to don socks, and when attempting to ambulate, actively pushing back with resistance in trunk when therapist attempted to advance gait. Pt left up at nurses station to aid in increased socialization; will continue to follow acutely.    Follow Up Recommendations  SNF    Equipment Recommendations  3 in 1 bedside commode;Wheelchair (measurements OT);Wheelchair cushion (measurements OT);Hospital bed    Recommendations for Other Services      Precautions / Restrictions Precautions Precautions: Fall Precaution Comments: Seizure, PEG abdominal binder        Mobility Bed Mobility Overal bed mobility: Needs Assistance Bed Mobility: Supine to Sit     Supine to sit: Mod assist     General bed mobility comments: hand over hand to complete long sitting and advance legs to sit up EOB, min gaurd sitting EOB  Transfers Overall transfer level: Needs assistance Equipment used: 1 person hand held assist Transfers: Sit to/from Stand Sit to Stand: Min assist         General transfer comment: less initation to date, but completed, pushing backwards when attempting to ambulate despite simple cues    Balance Overall balance assessment: Needs assistance Sitting-balance support: Single extremity supported;Feet supported Sitting balance-Leahy Scale: Fair     Standing balance support: Bilateral upper extremity supported Standing balance-Leahy Scale: Poor Standing balance comment: posterior lean in standing                           ADL either performed or assessed with clinical judgement   ADL Overall ADL's : Needs assistance/impaired         Upper Body Bathing: Total assistance   Lower Body Bathing: Total assistance   Upper Body Dressing : Total assistance   Lower Body Dressing: Total assistance Lower Body Dressing Details (indicate cue type and reason): Would not use backward chaining to date, attempted hand over hand to bring hand down to foot, pt pulling back     Toileting- Clothing Manipulation and Hygiene: Total assistance Toileting - Clothing Manipulation Details (indicate cue type and reason): soiled in bed upon arrival     Functional mobility during ADLs: Moderate assistance General ADL Comments: more dazed in  session, but would initiate with multimodal cues to sit up and stand pivot to chair, woud not ambulate to date, (actively pushing back and against therapist)     Vision       Perception     Praxis      Cognition Arousal/Alertness: Awake/alert Behavior During Therapy: Flat affect Overall  Cognitive Status: Impaired/Different from baseline Area of Impairment: Attention;Following commands;Safety/judgement;Problem solving;Awareness                   Current Attention Level: Focused   Following Commands: Follows one step commands inconsistently Safety/Judgement: Decreased awareness of safety;Decreased awareness of deficits Awareness: Intellectual Problem Solving: Slow processing;Requires verbal cues;Difficulty sequencing;Decreased initiation;Requires tactile cues General Comments: continues to be non-verbal, initiates movements with multi-modal cues, no self feeding at the time despite being placed at nurses station        Exercises     Shoulder Instructions       General Comments      Pertinent Vitals/ Pain       Pain Assessment: Faces Faces Pain Scale: No hurt  Home Living                                          Prior Functioning/Environment              Frequency  Min 2X/week        Progress Toward Goals  OT Goals(current goals can now be found in the care plan section)  Progress towards OT goals: Progressing toward goals  Acute Rehab OT Goals Patient Stated Goal: unable to state OT Goal Formulation: Patient unable to participate in goal setting Time For Goal Achievement: 03/09/20 Potential to Achieve Goals: Good  Plan Discharge plan remains appropriate    Co-evaluation                 AM-PAC OT "6 Clicks" Daily Activity     Outcome Measure   Help from another person eating meals?: A Lot Help from another person taking care of personal grooming?: A Lot Help from another person toileting, which includes using toliet, bedpan, or urinal?: A Lot Help from another person bathing (including washing, rinsing, drying)?: A Lot Help from another person to put on and taking off regular upper body clothing?: A Lot Help from another person to put on and taking off regular lower body clothing?: A Lot 6 Click Score:  12    End of Session Equipment Utilized During Treatment: Other (comment) (HHA)  OT Visit Diagnosis: Unsteadiness on feet (R26.81);Muscle weakness (generalized) (M62.81);Pain   Activity Tolerance Patient tolerated treatment well   Patient Left in chair;with call bell/phone within reach;with chair alarm set;Other (comment) (RN station to aid with socialization)   Nurse Communication Mobility status;Precautions        Time: [phone] OT Time Calculation (min): 18 min  Charges: OT General Charges $OT Visit: 1 Visit OT Treatments $Self Care/Home Management : 8-22 mins  Pollyann Glen E. Hisae Decoursey, COTA/L Acute Rehabilitation Services [phone]   Cherlyn Cushing 02/25/2020, 3:19 PM

## 2020-02-25 NOTE — Progress Notes (Addendum)
[facility] PROGRESS NOTE  Hillary Struss BEM:[id number] DOB: 1985-05-27 DOA: 01/04/2020 PCP: [name], PA-C     10/20 nonverbal and not making eye contact or following simple commands     10/25: Anita Davis much more alert today.  Eventually she was able to talk and express her needs.  Subsequently she was rolled out to the nurses station to continue to improve her socialization.  She was requesting a Covid vaccine.   Status: Inpatient-Remains inpatient appropriate because:Altered mental status and Unsafe d/c plan    Dispo: The patient is from: Home              Anticipated d/c is to: SNF              Anticipated d/c date is: > 3 days              Patient currently is medically stable to d/c.  Due to UNSAFE DC PLAN-patient needs placement in skilled nursing facility based on altered mentation and inability to manage self-care.  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 Do not expect recovery to previous baseline  Due to inconsistent alertness and thus inadequate oral intake of foods patient will continue G-tube for feedings and medications   Code Status: Full Family Communication: Updated [name] "[address]" [name] by voicemail on 10/25; 11/2 patient's other aunt at bedside today and updated on condition DVT prophylaxis: Lovenox Vaccination status: [name] confirmed pt given [employer] COVID vaccine in June. 2nd vaccine given 11/2  HPI: 34 y.o. F with hx substance use disorder, active, hepatitis C, and gestational diabetes who presented with confusion.  Patient initially admitted in September with encephalopathy, thought to be from drug overdose.  LP showed no signs of infection. MRI brain unremarkable. She remained encephalopathic, and EEG showed focal seizures.  The seizures were suppressed, and she was transferred to [facility] for continuous EEG, but after seizure control, she  had poor neurologic recovery.  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  She deteriorated after steroids were stopped, so IVIG was started.  Subjective: Awake with forward fixed gaze.  Not tracking today.  Remains nonverbal.  No spontaneous movement detected.  Meal tray in front of patient and has not attempted to eat.  Objective: Vitals:   02/25/20 0417 02/25/20 0753  BP: 113/74 104/65  Pulse: 92 77  Resp: 14 16  Temp: 98.4 F (36.9 C) 97.6 F (36.4 C)  SpO2: 99% 99%   No intake or output data in the 24 hours ending 02/25/20 0840 Filed Weights   02/22/20 0417 02/23/20 0500 02/24/20 0500  Weight: 72.5 kg 72.9 kg 71.4 kg    Exam: Constitutional: NAD, awake, flat affect, nonverbal Respiratory: clear to auscultation bilaterally, Normal respiratory effort. No accessory muscle use.  Room air Cardiovascular: Regular rate and rhythm. No extremity edema.    Abdomen: no tenderness palpation,G-tube in place.   Musculoskeletal: no clubbing / cyanosis. No contractures.  No tremors or spasticity  Neurologic: CN 2-12 grossly intact, not tracking movements or changing gaze when spoken to, when patient was following commands her strength in the upper and lower extremities 4/5. Sensation intact.  No focal neurological deficits appreciated. Psychiatric: Awake, remains nonverbal today so unable to determine orientation.   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 Initially treated as autoimmune encephalitis but despite appropriate treatment including IVIG no significant improvement in symptoms. -At this juncture it is suspected etiology related to sequelae from seizure activity that began prior  to admission and persisted in the form of status epilepticus after admission -Continue phenobarbital, Vimpat, and Dilantin -Continue empiric thiamine and vitamin B6 replacement -Seroquel initiated due to paranoid behaviors (primarily verbal expression) and witnessed behaviors concerning for possible hallucinations.  Daytime Seroquel resulted in oversedation. Nonverbal again after Seroquel initiated.  Changed to nocturnal dosing and despite low-dose no improvement in verbal communication therefore 11/2 Seroquel discontinued  Physical deconditioning -Working with PT and OT-motivated by food (use of chocolate) -Continues to require extensive assist for all ADLs with recommendation for SNF -PT notes significant increase in ambulation distance as of 10/27.   -11/3: PT documented continued issues with relative to follow commands as well as impaired use of rolling walker and unable to utilize PT cues for hand placement on rolling walker with continued requirement for tactile cueing and PT initiation of all mobility task through entire session.  Demented patient initiated a two-step command this session with motivation to get out of bed to chair noting the OT session was 1 hour after the PT session.  She demonstrated increased desire for p.o. intake at RN station versus room with chest OT.  Unfortunately she did not verbalize during the entire session making future cognitive assessment difficult  Suspected chronic depression in context of ongoing polysubstance abuse prior to admission -History obtained from family that patient has been demonstrating symptoms of depression for several months that had worsened just prior to presentation -Given waxing and waning mental status suspect a combination of recent neurological insult as well as severe depression contributing to mentation issues -Continue Zoloft with 37.5 mg daily  Dislodged gastrostomy tube -Placed by IR on 10/21  Dysphagia (resolved) with  persistent inadequate oral intake - SLP recommended regular diet with thin liquids but with waxing and waning alertness I have continued intermittent tube feedings given inconsistent intake of solid diet -Continue nocturnal tube feedings-if patient remains awake during the day can transition to bolus tube feedings based on amount of food eaten versus complete discontinuation of tube feedings if patient consumes at least 50% of meals consistently  Nonobstructive transaminitis/hepatitis C -Elevated HCV antibody with markedly elevated HCV RNA quantitative level  consistent with chronic hepatitis C  -10/25 AST and ALT greater than 100 but total bilirubin normal  Nutrition Status/moderate to severe protein calorie malnutrition: Nutrition Problem: Inadequate oral intake Etiology: lethargy/confusion Signs/Symptoms: meal completion < 25% Interventions: Tube feeding, Prostat Estimated body mass index is 26.19 kg/m as calculated from the following:   Height as of this encounter: 5\' 5"  (1.651 m).   Weight as of this encounter: 71.4 kg.     Data Reviewed: Basic Metabolic Panel: No results for input(s): NA, K, CL, CO2, GLUCOSE, BUN, CREATININE, CALCIUM, MG, PHOS in the last 168 hours. Liver Function Tests: No results for input(s): AST, ALT, ALKPHOS, BILITOT, PROT, ALBUMIN in the last 168 hours. No results for input(s): LIPASE, AMYLASE in the last 168 hours. No results for input(s): AMMONIA in the last 168 hours. CBC: No results for input(s): WBC, NEUTROABS, HGB, HCT, MCV, PLT in the last 168 hours. Cardiac Enzymes: No results for input(s): CKTOTAL, CKMB, CKMBINDEX, TROPONINI in the last 168 hours. BNP (last 3 results) No results for input(s): BNP in the last 8760 hours.  ProBNP (last  3 results) No results for input(s): PROBNP in the last 8760 hours.  CBG: Recent Labs  Lab 02/23/20 2101 02/23/20 2342 02/24/20 0500 02/24/20 0859 02/24/20 1948  GLUCAP 142* 150* 125* 152* 211*    No  results found for this or any previous visit (from the past 240 hour(s)).   Studies: No results found.  Scheduled Meds: . amantadine  100 mg Per Tube BID  . Chlorhexidine Gluconate Cloth  6 each Topical Daily  . famotidine  20 mg Per Tube BID  . feeding supplement (PROSource TF)  45 mL Per Tube Daily  . free water  200 mL Per Tube Q6H  . lacosamide  200 mg Per Tube BID  . PHENObarbital  60 mg Per Tube BID  . phenytoin  200 mg Per Tube BID  . polyethylene glycol  17 g Per Tube Daily  . potassium chloride  40 mEq Per Tube Daily  . vitamin B-6  100 mg Per Tube Daily  . senna  1 tablet Per Tube QHS  . sertraline  37.5 mg Per Tube Daily  . thiamine  100 mg Per Tube Daily   Continuous Infusions: . feeding supplement (OSMOLITE 1.2 CAL) 1,000 mL (02/24/20 1709)    Principal Problem:   Refractory seizure (HCC) Active Problems:   Acute metabolic encephalopathy   Hypokalemia   Polysubstance abuse (HCC)   Elevated CK   Transaminitis   Chronic hepatitis C without hepatic coma (HCC)   Distended abdomen   Palliative care encounter   Colonic Ileus (HCC)   Organic brain syndrome   On enteral nutrition   Physical deconditioning   Protein-calorie malnutrition (HCC)   Inadequate oral nutritional intake   Consultants:  Neurology  Psychiatry  Interventional radiology  Surgery  Procedures:  9/14 lumbar puncture  9/15 EEG  9/16 EEG  9/17 EEG  9/19 overnight EEG with video  9/22 overnight EEG with video with discontinuation of long-term EEG monitoring on 9/25  9/24 core track placement  10/6 EEG  Antibiotics: Anti-infectives (From admission, onward)   Start     Dose/Rate Route Frequency Ordered Stop   02/11/20 1526  ceFAZolin (ANCEF) IVPB 2g/100 mL premix        2 g 200 mL/hr over 30 Minutes Intravenous  Once 02/11/20 1526 02/11/20 1641   02/05/20 0600  ceFAZolin (ANCEF) IVPB 2g/100 mL premix        2 g 200 mL/hr over 30 Minutes Intravenous To Short Stay  02/04/20 1054 02/05/20 0950   01/29/20 0600  ceFAZolin (ANCEF) IVPB 2g/100 mL premix        2 g 200 mL/hr over 30 Minutes Intravenous On call to O.R. 01/28/20 1511 01/30/20 0559   01/28/20 2000  cefTRIAXone (ROCEPHIN) 1 g in sodium chloride 0.9 % 100 mL IVPB        1 g 200 mL/hr over 30 Minutes Intravenous Every 24 hours 01/28/20 1925 02/02/20 0724   01/06/20 0900  cefTRIAXone (ROCEPHIN) 2 g in sodium chloride 0.9 % 100 mL IVPB  Status:  Discontinued        2 g 200 mL/hr over 30 Minutes Intravenous Every 12 hours 01/06/20 0105 01/06/20 2202   01/06/20 0800  vancomycin (VANCOCIN) IVPB 1000 mg/200 mL premix  Status:  Discontinued       "Followed by" Linked Group Details   1,000 mg 200 mL/hr over 60 Minutes Intravenous Every 12 hours 01/05/20 1904 01/06/20 2202   01/05/20 2000  vancomycin (VANCOREADY) IVPB 1500 mg/300 mL       "  Followed by" Linked Group Details   1,500 mg 150 mL/hr over 120 Minutes Intravenous  Once 01/05/20 1904 01/06/20 0127   01/05/20 1830  cefTRIAXone (ROCEPHIN) 2 g in sodium chloride 0.9 % 100 mL IVPB        2 g 200 mL/hr over 30 Minutes Intravenous  Once 01/05/20 1829 01/05/20 2220       Time spent: 20    Junious SilkAllison Arlynn Stare ANP  [facility] Pager [phone]. If 7PM-7AM, please contact night-coverage at [email or web address] 02/25/2020, 8:40 AM  LOS: 50 days

## 2020-02-26 MED ORDER — PROSOURCE TF PO LIQD
45.0000 mL | Freq: Three times a day (TID) | ORAL | Status: DC
  Administered 2020-02-26 – 2020-04-06 (×117): 45 mL
  Filled 2020-02-26 (×119): qty 45

## 2020-02-26 MED ORDER — ENSURE ENLIVE PO LIQD
237.0000 mL | Freq: Two times a day (BID) | ORAL | Status: DC
  Administered 2020-02-26 – 2020-03-12 (×23): 237 mL via ORAL
  Filled 2020-02-26: qty 237

## 2020-02-26 MED ORDER — OSMOLITE 1.5 CAL PO LIQD
355.0000 mL | Freq: Three times a day (TID) | ORAL | Status: DC | PRN
  Administered 2020-02-26 – 2020-02-28 (×3): 355 mL
  Administered 2020-03-01: 360 mL
  Administered 2020-03-02 – 2020-03-09 (×4): 355 mL
  Filled 2020-02-26: qty 474

## 2020-02-26 NOTE — NC FL2 (Signed)
[place] MEDICAID FL2 LEVEL OF CARE SCREENING TOOL     IDENTIFICATION  Patient Name: Anita Davis Birthdate: [birth year] Sex: female Admission Date (Current Location): 01/04/2020  [facility] and [place] Number:  [employer] and Address:  [facility], [address], [place], [place] [address]      Provider Number: [id number]  Attending Physician Name and Address:  Lorin Glass, MD  Relative Name and Phone Number:       Current Level of Care: Hospital Recommended Level of Care: Skilled Nursing Facility Prior Approval Number:    Date Approved/Denied:   PASRR Number:    Discharge Plan: SNF    Current Diagnoses: Patient Active Problem List   Diagnosis Date Noted   Organic brain syndrome 02/25/2020   On enteral nutrition 02/25/2020   Physical deconditioning 02/25/2020   Protein-calorie malnutrition (HCC) 02/25/2020   Inadequate oral nutritional intake 02/25/2020   Colonic Ileus (HCC) 02/04/2020   Distended abdomen    Palliative care encounter    Chronic hepatitis C without hepatic coma (HCC) 01/13/2020   Elevated CK 01/06/2020   Transaminitis 01/06/2020   Refractory seizure (HCC) 01/06/2020   Acute metabolic encephalopathy 01/05/2020   Hypokalemia 01/05/2020   Polysubstance abuse (HCC) 01/05/2020   Acute cholecystitis    Anemia    Abdominal pain 01/24/2017   Hyperglycemia 01/24/2017   Fever 01/24/2017    Orientation RESPIRATION BLADDER Height & Weight     Self  Normal Incontinent Weight: 71.4 kg Height:  5\' 5"  (165.1 cm)  BEHAVIORAL SYMPTOMS/MOOD NEUROLOGICAL BOWEL NUTRITION STATUS    Convulsions/Seizures Incontinent Diet, Feeding tube (Regular diet with thin liquids/ Gtube with Osmolite 1.2 at 65cc/ hr)  AMBULATORY STATUS COMMUNICATION OF NEEDS Skin   Extensive Assist Verbally Skin abrasions (bilateral arms and legs)                       Personal Care Assistance Level of Assistance  Bathing,  Feeding, Dressing Bathing Assistance: Maximum assistance Feeding assistance: Maximum assistance Dressing Assistance: Maximum assistance     Functional Limitations Info  Sight, Hearing, Speech Sight Info: Adequate Hearing Info: Adequate Speech Info: Impaired    SPECIAL CARE FACTORS FREQUENCY  PT (By licensed PT), OT (By licensed OT), Speech therapy     PT Frequency: 5x/wk OT Frequency: 5x/wk     Speech Therapy Frequency: 5x/wk      Contractures Contractures Info: Not present    Additional Factors Info  Code Status, Allergies, Psychotropic Code Status Info: Full Allergies Info: NKA Psychotropic Info: Vimpat 200 mg BiD/ Phenobarb 60 mg BiD (20mg / 66ml soln)/ Dilantin soln 200 mg BiD/ Zoloft 37.5 mg Daily         Current Medications (02/26/2020):  This is the current hospital active medication list Current Facility-Administered Medications  Medication Dose Route Frequency Provider Last Rate Last Admin   acetaminophen (TYLENOL) tablet 650 mg  650 mg Per Tube Q6H PRN 4m, RPH   650 mg at 02/21/20 2130   amantadine (SYMMETREL) 50 MG/5ML solution 100 mg  100 mg Per Tube BID 02/23/20, MD   100 mg at 02/25/20 2134   Chlorhexidine Gluconate Cloth 2 % PADS 6 each  6 each Topical Daily 13/04/21, MD   6 each at 02/25/20 1051   famotidine (PEPCID) 40 MG/5ML suspension 20 mg  20 mg Per Tube BID Gaynelle Adu, NP   20 mg at 02/25/20 2134   feeding supplement (OSMOLITE 1.2  CAL) liquid 1,000 mL  1,000 mL Per Tube Continuous Dimple Nanas, MD 65 mL/hr at 02/26/20 0213 1,000 mL at 02/26/20 0213   feeding supplement (PROSource TF) liquid 45 mL  45 mL Per Tube Daily Amin, Ankit Chirag, MD   45 mL at 02/25/20 1050   free water 200 mL  200 mL Per Tube Q6H Gaynelle Adu, MD   200 mL at 02/26/20 0600   hydrALAZINE (APRESOLINE) injection 10 mg  10 mg Intravenous Q6H PRN Gaynelle Adu, MD   10 mg at 01/27/20 1628   lacosamide (VIMPAT) tablet 200 mg  200 mg Per Tube  BID Russella Dar, NP   200 mg at 02/25/20 2133   LORazepam (ATIVAN) injection 2 mg  2 mg Intravenous Q4H PRN Russella Dar, NP       LORazepam (ATIVAN) tablet 0.5 mg  0.5 mg Per Tube Q6H PRN Royce Macadamia, RPH   0.5 mg at 02/21/20 1654   ondansetron (ZOFRAN) injection 4 mg  4 mg Intravenous Q6H PRN Gaynelle Adu, MD   4 mg at 02/12/20 1213   oxyCODONE (Oxy IR/ROXICODONE) immediate release tablet 5 mg  5 mg Per Tube Q4H PRN Royce Macadamia, RPH       PHENObarbital 20 MG/5ML elixir 60 mg  60 mg Per Tube BID Russella Dar, NP   60 mg at 02/25/20 2133   phenytoin (DILANTIN) 125 MG/5ML suspension 200 mg  200 mg Per Tube BID Russella Dar, NP   200 mg at 02/25/20 2134   polyethylene glycol (MIRALAX / GLYCOLAX) packet 17 g  17 g Per Tube Daily Gaynelle Adu, MD   17 g at 02/25/20 1050   potassium chloride 20 MEQ/15ML (10%) solution 40 mEq  40 mEq Per Tube Daily Gaynelle Adu, MD   40 mEq at 02/25/20 1050   pyridOXINE (VITAMIN B-6) tablet 100 mg  100 mg Per Tube Daily Russella Dar, NP   100 mg at 02/25/20 1050   senna (SENOKOT) tablet 8.6 mg  1 tablet Per Tube QHS Gaynelle Adu, MD   8.6 mg at 02/25/20 2133   sertraline (ZOLOFT) tablet 37.5 mg  37.5 mg Per Tube Daily Russella Dar, NP   37.5 mg at 02/25/20 1050   thiamine tablet 100 mg  100 mg Per Tube Daily Russella Dar, NP   100 mg at 02/25/20 1050     Discharge Medications: Please see discharge summary for a list of discharge medications.  Relevant Imaging Results:  Relevant Lab Results:   Additional Information SS#: [id number]---LKGM be a LOG with disability pending  Kermit Balo, RN

## 2020-02-26 NOTE — NC FL2 (Signed)
[place] MEDICAID FL2 LEVEL OF CARE SCREENING TOOL     IDENTIFICATION  Patient Name: Anita Davis Birthdate: [birth year] Sex: female Admission Date (Current Location): 01/04/2020  [facility] and [place] Number:  [employer] and Address:  [facility], [address], [place], [place] [address]      Provider Number: [id number]  Attending Physician Name and Address:  Lorin Glass, MD  Relative Name and Phone Number:       Current Level of Care: Hospital Recommended Level of Care: Skilled Nursing Facility Prior Approval Number:    Date Approved/Denied:   PASRR Number: [id number]  Discharge Plan: SNF    Current Diagnoses: Patient Active Problem List   Diagnosis Date Noted  . 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 On enteral nutrition 02/25/2020  . Physical deconditioning 02/25/2020  . Protein-calorie malnutrition (HCC) 02/25/2020  . Inadequate oral nutritional intake 02/25/2020  . Colonic Ileus (HCC) 02/04/2020  . Distended abdomen   . Palliative care encounter   . Chronic hepatitis C without hepatic coma (HCC) 01/13/2020  . Elevated CK 01/06/2020  . Transaminitis 01/06/2020  . 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 Acute metabolic encephalopathy 01/05/2020  . Hypokalemia 01/05/2020  . Polysubstance abuse (HCC) 01/05/2020  . Acute cholecystitis   . Anemia   . Abdominal pain 01/24/2017  . Hyperglycemia 01/24/2017  . Fever 01/24/2017    Orientation RESPIRATION BLADDER Height & Weight     Self  Normal Incontinent Weight: 157 lb 6.5 oz (71.4 kg) Height:  5\' 5"  (165.1 cm)  BEHAVIORAL SYMPTOMS/MOOD NEUROLOGICAL BOWEL NUTRITION STATUS    Convulsions/Seizures Incontinent Diet, Feeding tube (Regular diet with thin liquids/ Gtube with Osmolite 1.2 at 65cc/ hr)  AMBULATORY STATUS COMMUNICATION OF NEEDS Skin   Extensive Assist Verbally Skin abrasions (bilateral arms and legs)                       Personal Care Assistance Level  of Assistance  Bathing, Feeding, Dressing Bathing Assistance: Maximum assistance Feeding assistance: Maximum assistance Dressing Assistance: Maximum assistance     Functional Limitations Info  Sight, Hearing, Speech Sight Info: Adequate Hearing Info: Adequate Speech Info: Impaired    SPECIAL CARE FACTORS FREQUENCY  PT (By licensed PT), OT (By licensed OT), Speech therapy     PT Frequency: 5x/wk OT Frequency: 5x/wk     Speech Therapy Frequency: 5x/wk      Contractures Contractures Info: Not present    Additional Factors Info  Code Status, Allergies, Psychotropic Code Status Info: Full Allergies Info: NKA Psychotropic Info: Vimpat 200 mg BiD/ Phenobarb 60 mg BiD (20mg / 28ml soln)/ Dilantin soln 200 mg BiD/ Zoloft 37.5 mg Daily         Current Medications (02/26/2020):  This is the current hospital active medication list Current Facility-Administered Medications  Medication Dose Route Frequency Provider Last Rate Last Admin  . acetaminophen (TYLENOL) tablet 650 mg  650 mg Per Tube Q6H PRN 4m, RPH   650 mg at 02/21/20 2130  . amantadine (SYMMETREL) 50 MG/5ML solution 100 mg  100 mg Per Tube BID 02/23/20, MD   100 mg at 02/26/20 0953  . Chlorhexidine Gluconate Cloth 2 % PADS 6 each  6 each Topical Daily Gaynelle Adu, MD   6 each at 02/26/20 [phone]  . famotidine (PEPCID) 40 MG/5ML suspension 20 mg  20 mg Per Tube BID 13/05/21, NP   20 mg at 02/26/20 0953  . feeding  supplement (ENSURE ENLIVE / ENSURE PLUS) liquid 237 mL  237 mL Oral BID BM Russella Dar, NP      . feeding supplement (OSMOLITE 1.5 CAL) liquid 355 mL  355 mL Per Tube TID WC PRN Russella Dar, NP      . feeding supplement (PROSource TF) liquid 45 mL  45 mL Per Tube TID Russella Dar, NP      . free water 200 mL  200 mL Per Tube Q6H Gaynelle Adu, MD   200 mL at 02/26/20 1126  . hydrALAZINE (APRESOLINE) injection 10 mg  10 mg Intravenous Q6H PRN Gaynelle Adu, MD   10 mg at 01/27/20  1628  . lacosamide (VIMPAT) tablet 200 mg  200 mg Per Tube BID Russella Dar, NP   200 mg at 02/26/20 0952  . LORazepam (ATIVAN) injection 2 mg  2 mg Intravenous Q4H PRN Russella Dar, NP      . LORazepam (ATIVAN) tablet 0.5 mg  0.5 mg Per Tube Q6H PRN Royce Macadamia, RPH   0.5 mg at 02/21/20 1654  . ondansetron (ZOFRAN) injection 4 mg  4 mg Intravenous Q6H PRN Gaynelle Adu, MD   4 mg at 02/12/20 1213  . oxyCODONE (Oxy IR/ROXICODONE) immediate release tablet 5 mg  5 mg Per Tube Q4H PRN Royce Macadamia, RPH      . PHENObarbital 20 MG/5ML elixir 60 mg  60 mg Per Tube BID Russella Dar, NP   60 mg at 02/26/20 0953  . phenytoin (DILANTIN) 125 MG/5ML suspension 200 mg  200 mg Per Tube BID Russella Dar, NP   200 mg at 02/26/20 0953  . polyethylene glycol (MIRALAX / GLYCOLAX) packet 17 g  17 g Per Tube Daily Gaynelle Adu, MD   17 g at 02/26/20 1126  . potassium chloride 20 MEQ/15ML (10%) solution 40 mEq  40 mEq Per Tube Daily Gaynelle Adu, MD   40 mEq at 02/26/20 0953  . pyridOXINE (VITAMIN B-6) tablet 100 mg  100 mg Per Tube Daily Russella Dar, NP   100 mg at 02/26/20 0952  . senna (SENOKOT) tablet 8.6 mg  1 tablet Per Tube QHS Gaynelle Adu, MD   8.6 mg at 02/25/20 2133  . sertraline (ZOLOFT) tablet 37.5 mg  37.5 mg Per Tube Daily Russella Dar, NP   37.5 mg at 02/26/20 0953  . thiamine tablet 100 mg  100 mg Per Tube Daily Russella Dar, NP   100 mg at 02/26/20 0350     Discharge Medications: Please see discharge summary for a list of discharge medications.  Relevant Imaging Results:  Relevant Lab Results:   Additional Information SS#: [id number]---BZJI be a LOG with disability pending  Carley Hammed, LCSWA

## 2020-02-26 NOTE — Progress Notes (Addendum)
Nutrition Follow-up  DOCUMENTATION CODES:   Not applicable  INTERVENTION:  -Ensure Enlive po BID, each supplement provides 350 kcal and 20 grams of protein -Assist pt with all meals and encourage PO intake -35ml Prosource TF TID via PEG -If pt consumes <50% of her meal, provide her with 1.5 cartons ( ) of Osmolite 1.5 cal via PEG and flush with 41ml free water before and after each flush   Each 1.5 carton ( ) bolus will provide 532 kcals, 22 grams protein, and free water    NUTRITION DIAGNOSIS:   Inadequate oral intake related to lethargy/confusion as evidenced by meal completion < 25%.  ongoing  GOAL:   Patient will meet greater than or equal to 90% of their needs  Addressed with TF  MONITOR:   TF tolerance, Labs, Weight trends, I & O's  REASON FOR ASSESSMENT:   Consult Enteral/tube feeding initiation and management  ASSESSMENT:   Pt with refractory seizures with acute metabolic encephalopathy. PMH includes hepatitis and polysubstance abuse.  9/24 Cortrak placed (gastric) 10/10 Cortrak dislodged, TF held 10/11 Cortrak advanced (tip in proximal duodenum per KUB), TF restarted 10/14 TF reduced to 59ml/hr due to colonic ileus 10/15 G-tube placement 10/16 TF resumed 10/18 TF rate returned to 42ml/hr; G-tube pulled out 1 inch so TF held 10/21 G-tube replaced 10/27 diet advanced to regular  NP messaged RD about transitioning pt back to nocturnal feeding to promote appetite during the day as pt is still not taking much PO (current regimen is Osmolite 1.2 @ 29ml/hr with 59ml Prosource TF daily and free water Q6H). Nocturnal feeding was previously attempted from 10/27-11/2 with no success in increasing pt's po intake. NP and RD developed plan to have staff assist pt with meals and if pt eats <50% of the meal, will provide bolus TF via PEG as indicated above. Discussed with RN as well. Will attempt this for 1 week per NP instructions and if pt's intake  does not improve, will return to meeting 100% of pt's needs via TF.   Admit wt: 74.8 kg Current wt: 71.4 kg  Labs reviewed Medications: Pepcid, Miralax, KCl , vitamin B6, Senokot, Thiamine   Diet Order:   Diet Order            Diet regular Room service appropriate? Yes; Fluid consistency: Thin  Diet effective now                 EDUCATION NEEDS:   No education needs have been identified at this time  Skin:  Skin Assessment: Skin Integrity Issues: Skin Integrity Issues:: Incisions Incisions: abdomen  Last BM:  02/24/20  Height:   Ht Readings from Last 1 Encounters:  01/06/20 5\' 5"  (1.651 m)    Weight:   Wt Readings from Last 1 Encounters:  02/24/20 71.4 kg   BMI:  Body mass index is 26.19 kg/m.  Estimated Nutritional Needs:   Kcal:  1800-2000  Protein:  90-100 grams  Fluid:  >/=1.8L/d    13/03/21, MS, RD, LDN RD pager number and weekend/on-call pager number located in [place].

## 2020-02-26 NOTE — Progress Notes (Signed)
[facility] PROGRESS NOTE  Alize Borrayo [id number]:[id number] DOB: [birth year] DOA: 01/04/2020 PCP: [name], PA-C     10/20 nonverbal and not making eye contact or following simple commands     10/25: [name] much more alert today.  Eventually she was able to talk and express her needs.  Subsequently she was rolled out to the nurses station to continue to improve her socialization.  She was requesting a Covid vaccine.   Status: Inpatient-Remains inpatient appropriate because:Altered mental status and Unsafe d/c plan    Dispo: The patient is from: Home              Anticipated d/c is to: SNF              Anticipated d/c date is: > 3 days              Patient currently is medically stable to d/c.  Due to UNSAFE DC PLAN-patient needs placement in skilled nursing facility based on altered mentation and inability to manage self-care.  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 Do not expect recovery to previous baseline  Due to inconsistent alertness and thus inadequate oral intake of foods patient will continue G-tube for feedings and medications   Code Status: Full Family Communication: Updated [name] "[address]" [name] by voicemail on 10/25; 11/2 patient's other aunt at bedside today and updated on condition DVT prophylaxis: Lovenox Vaccination status: [name] confirmed pt given [employer] COVID vaccine in June. 2nd vaccine given 11/2  HPI: 34 y.o. F with hx substance use disorder, active, hepatitis C, and gestational diabetes who presented with confusion.  Patient initially admitted in September with encephalopathy, thought to be from drug overdose.  LP showed no signs of infection. MRI brain unremarkable. She remained encephalopathic, and EEG showed focal seizures.  The seizures were suppressed, and she was transferred to [facility] for continuous EEG, but after seizure control, she  had poor neurologic recovery.  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  She deteriorated after steroids were stopped, so IVIG was started.  Subjective: Awake; tracking inconsistently around the room but not in response to examiner. Remains nonverbal. Appears to be looking at activity outside of her room in the hallway. Breakfast tray has been set up in front of patient and she is not attempting to eat.  Objective: Vitals:   02/26/20 0805 02/26/20 1147  BP: 114/80 129/86  Pulse: 83 89  Resp: 18 18  Temp: 98.3 F (36.8 C) 98.2 F (36.8 C)  SpO2: 99% 100%    Intake/Output Summary (Last 24 hours) at 02/26/2020 1318 Last data filed at 02/25/2020 1540 Gross per 24 hour  Intake 500 ml  Output --  Net 500 ml   Filed Weights   02/22/20 0417 02/23/20 0500 02/24/20 0500  Weight: 72.5 kg 72.9 kg 71.4 kg    Exam: Constitutional: NAD, awake, flat affect, nonverbal Respiratory: clear to auscultation bilaterally, Normal respiratory effort. No accessory muscle use.  Room air Cardiovascular: Regular rate and rhythm. No extremity edema.    Abdomen: no tenderness palpation,G-tube in place with tube feeding infusing.   Musculoskeletal: no clubbing / cyanosis. No contractures.  No tremors or spasticity  Neurologic: CN 2-12 grossly intact, not tracking movements or changing gaze when spoken to, when patient was following commands her strength in the upper and lower extremities 4/5. Sensation intact.  No  focal neurological deficits appreciated. Observed with purposeful wiping of face with towel upon entry to room Psychiatric: Awake, remains nonverbal -unable to determine orientation.   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 Initially treated as autoimmune encephalitis but despite appropriate treatment including IVIG no significant improvement in symptoms. -At this juncture it is suspected etiology related to sequelae from seizure activity that began prior to admission and persisted in the form of status epilepticus after admission -Continues with cyclic episodes of wakefulness/speaking then minimal responsiveness/non-verbal -Continue phenobarbital, Vimpat, and Dilantin -Continue empiric thiamine and vitamin B6 replacement -Seroquel initiated due to paranoid behaviors (primarily verbal expression) and witnessed behaviors concerning for possible hallucinations.  Daytime Seroquel resulted in oversedation. Nonverbal again after Seroquel initiated.  Changed to nocturnal dosing and despite low-dose no improvement in verbal communication therefore 11/2 Seroquel discontinued  Physical deconditioning -Working with PT and OT-motivated by food (use of chocolate) -Continues to require extensive assist for all ADLs with recommendation for SNF -PT notes significant increase in ambulation distance as of 10/27.   -PT and OT are working with patient. She remains nonverbal and with max assist has been able to walk in halls. And is to place patient out of bed to chair and nursing station daily to improve socialization  Suspected chronic depression in context of ongoing polysubstance abuse prior to admission -History obtained from family that patient has been demonstrating symptoms of depression for several months that had worsened just prior to presentation -Given waxing and waning mental status suspect a combination of recent neurological insult as well as severe depression contributing to mentation issues -Continue Zoloft with 37.5 mg daily  Dislodged gastrostomy tube -Placed by IR on 10/21  Dysphagia (resolved) with persistent inadequate oral intake - SLP recommended regular diet with thin  liquids but with waxing and waning alertness I have continued intermittent tube feedings given inconsistent intake of solid diet -Continue nocturnal tube feedings-if patient remains awake during the day can transition to bolus tube feedings based on amount of food eaten versus complete discontinuation of tube feedings if patient consumes at least 50% of meals consistently  Nonobstructive transaminitis/hepatitis C -Elevated HCV antibody with markedly elevated HCV RNA quantitative level  consistent with chronic hepatitis C  -10/25 AST and ALT greater than 100 but total bilirubin normal  Nutrition Status/moderate to severe protein calorie malnutrition: Nutrition Problem: Inadequate oral intake Etiology: lethargy/confusion Signs/Symptoms: meal completion < 25% Interventions: Tube feeding, Prostat Estimated body mass index is 26.19 kg/m as calculated from the following:   Height as of this encounter: 5\' 5"  (1.651 m).   Weight as of this encounter: 71.4 kg.     Data Reviewed: Basic Metabolic Panel: No results for input(s): NA, K, CL, CO2, GLUCOSE, BUN, CREATININE, CALCIUM, MG, PHOS in the last 168 hours. Liver Function Tests: No results for input(s): AST, ALT, ALKPHOS, BILITOT, PROT, ALBUMIN in the last 168 hours. No results for input(s): LIPASE, AMYLASE in the last 168 hours. No results for input(s): AMMONIA in the last 168 hours. CBC: No results for input(s): WBC, NEUTROABS, HGB, HCT, MCV, PLT in the last 168 hours. Cardiac Enzymes: No results for input(s): CKTOTAL, CKMB, CKMBINDEX, TROPONINI in the last 168 hours. BNP (last 3 results) No results for input(s): BNP in the last 8760 hours.  ProBNP (last 3 results) No results for input(s): PROBNP in the last 8760  hours.  CBG: Recent Labs  Lab 02/23/20 2101 02/23/20 2342 02/24/20 0500 02/24/20 0859 02/24/20 1948  GLUCAP 142* 150* 125* 152* 211*    No results found for this or any previous visit (from the past 240 hour(s)).    Studies: No results found.  Scheduled Meds: . amantadine  100 mg Per Tube BID  . Chlorhexidine Gluconate Cloth  6 each Topical Daily  . famotidine  20 mg Per Tube BID  . feeding supplement (PROSource TF)  45 mL Per Tube Daily  . free water  200 mL Per Tube Q6H  . lacosamide  200 mg Per Tube BID  . PHENObarbital  60 mg Per Tube BID  . phenytoin  200 mg Per Tube BID  . polyethylene glycol  17 g Per Tube Daily  . potassium chloride  40 mEq Per Tube Daily  . vitamin B-6  100 mg Per Tube Daily  . senna  1 tablet Per Tube QHS  . sertraline  37.5 mg Per Tube Daily  . thiamine  100 mg Per Tube Daily   Continuous Infusions: . feeding supplement (OSMOLITE 1.2 CAL) 1,000 mL (02/26/20 0213)    Principal Problem:   Refractory seizure (HCC) Active Problems:   Acute metabolic encephalopathy   Hypokalemia   Polysubstance abuse (HCC)   Elevated CK   Transaminitis   Chronic hepatitis C without hepatic coma (HCC)   Distended abdomen   Palliative care encounter   Colonic Ileus (HCC)   Organic brain syndrome   On enteral nutrition   Physical deconditioning   Protein-calorie malnutrition (HCC)   Inadequate oral nutritional intake   Consultants:  Neurology  Psychiatry  Interventional radiology  Surgery  Procedures:  9/14 lumbar puncture  9/15 EEG  9/16 EEG  9/17 EEG  9/19 overnight EEG with video  9/22 overnight EEG with video with discontinuation of long-term EEG monitoring on 9/25  9/24 core track placement  10/6 EEG  Antibiotics: Anti-infectives (From admission, onward)   Start     Dose/Rate Route Frequency Ordered Stop   02/11/20 1526  ceFAZolin (ANCEF) IVPB 2g/100 mL premix        2 g 200 mL/hr over 30 Minutes Intravenous  Once 02/11/20 1526 02/11/20 1641   02/05/20 0600  ceFAZolin (ANCEF) IVPB 2g/100 mL premix        2 g 200 mL/hr over 30 Minutes Intravenous To Short Stay 02/04/20 1054 02/05/20 0950   01/29/20 0600  ceFAZolin (ANCEF) IVPB 2g/100 mL  premix        2 g 200 mL/hr over 30 Minutes Intravenous On call to O.R. 01/28/20 1511 01/30/20 0559   01/28/20 2000  cefTRIAXone (ROCEPHIN) 1 g in sodium chloride 0.9 % 100 mL IVPB        1 g 200 mL/hr over 30 Minutes Intravenous Every 24 hours 01/28/20 1925 02/02/20 0724   01/06/20 0900  cefTRIAXone (ROCEPHIN) 2 g in sodium chloride 0.9 % 100 mL IVPB  Status:  Discontinued        2 g 200 mL/hr over 30 Minutes Intravenous Every 12 hours 01/06/20 0105 01/06/20 2202   01/06/20 0800  vancomycin (VANCOCIN) IVPB 1000 mg/200 mL premix  Status:  Discontinued       "Followed by" Linked Group Details   1,000 mg 200 mL/hr over 60 Minutes Intravenous Every 12 hours 01/05/20 1904 01/06/20 2202   01/05/20 2000  vancomycin (VANCOREADY) IVPB 1500 mg/300 mL       "Followed by" Linked Group Details  1,500 mg 150 mL/hr over 120 Minutes Intravenous  Once 01/05/20 1904 01/06/20 0127   01/05/20 1830  cefTRIAXone (ROCEPHIN) 2 g in sodium chloride 0.9 % 100 mL IVPB        2 g 200 mL/hr over 30 Minutes Intravenous  Once 01/05/20 1829 01/05/20 2220       Time spent: 15    Junious SilkAllison Shaconda Hajduk ANP  [facility] Pager [phone]. If 7PM-7AM, please contact night-coverage at [email or web address] 02/26/2020, 1:18 PM  LOS: 51 days

## 2020-02-27 MED ORDER — POTASSIUM CHLORIDE 20 MEQ PO PACK
40.0000 meq | PACK | Freq: Every day | ORAL | Status: DC
  Administered 2020-02-27 – 2020-03-30 (×33): 40 meq
  Filled 2020-02-27 (×33): qty 2

## 2020-02-27 NOTE — Progress Notes (Signed)
[facility] PROGRESS NOTE  Anita Davis TKP:[id number] DOB: 1985/06/04 DOA: 01/04/2020 PCP: [name], PA-C    Status: Inpatient-Remains inpatient appropriate because:Altered mental status and Unsafe d/c plan    Dispo: The patient is from: Home              Anticipated d/c is to: SNF              Anticipated d/c date is: > 3 days              Patient currently is medically stable to d/c.  Due to UNSAFE DC PLAN-patient needs placement in skilled nursing facility based on altered mentation and inability to manage self-care.  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 Do not expect recovery to previous baseline  Due to inconsistent alertness and thus inadequate oral intake of foods patient will continue G-tube for feedings and medications   Code Status: Full Family Communication: Updated Anita Davis "[address]" [name] by voicemail on 10/25; 11/2 patient's other aunt at bedside today and updated on condition DVT prophylaxis: Lovenox Vaccination status: [name] confirmed pt given [employer] COVID vaccine in June. 2nd vaccine given 11/2  HPI: 34 y.o. F with hx substance use disorder, active, hepatitis C, and gestational diabetes who presented with confusion.  Patient initially admitted in September with encephalopathy, thought to be from drug overdose.  LP showed no signs of infection. MRI brain unremarkable. She remained encephalopathic, and EEG showed focal seizures.  The seizures were suppressed, and she was transferred to [facility] for continuous EEG, but after seizure control, she had poor neurologic recovery.  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  She deteriorated after steroids were stopped, so IVIG was started.  Subjective: Seen and examined this morning.  Awake but not alert, not tracking my  movements.  Remains nonverbal.  Unable to answer any questions.  Unable to follow any commands.  Objective: Vitals:   02/27/20 0730 02/27/20 1113  BP: (!) 113/93 132/79  Pulse: 92 85  Resp: 18 18  Temp: 98.3 F (36.8 C) 99.1 F (37.3 C)  SpO2: 98% 100%    Intake/Output Summary (Last 24 hours) at 02/27/2020 1150 Last data filed at 02/26/2020 1848 Gross per 24 hour  Intake 755 ml  Output --  Net 755 ml   Filed Weights   02/23/20 0500 02/24/20 0500 02/27/20 0500  Weight: 72.9 kg 71.4 kg 74.1 kg    Exam: Constitutional: NAD, awake, flat affect, nonverbal Respiratory: clear to auscultation bilaterally, Normal respiratory effort. No accessory muscle use.  Room air Cardiovascular: Regular rate and rhythm. No extremity edema.    Abdomen: no tenderness palpation,G-tube in place with tube feeding infusing.   Musculoskeletal: no clubbing / cyanosis. No contractures.  No tremors or spasticity  Neurologic: Nonverbal, following with neuro alert.  Unable to follow any commands.   Psychiatric: Awake, remains nonverbal -unable to determine orientation.   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 Initially treated as autoimmune encephalitis but despite appropriate treatment including IVIG no significant improvement in symptoms. -At this juncture it is suspected etiology related to sequelae from seizure activity that began prior to admission and persisted in the form of status epilepticus after admission -Continues with cyclic episodes of  wakefulness/speaking then minimal responsiveness/non-verbal -Continue phenobarbital, Vimpat, and Dilantin -Continue empiric thiamine and vitamin B6 replacement -Seroquel initiated due to paranoid behaviors (primarily verbal expression)  and witnessed behaviors concerning for possible hallucinations.  Daytime Seroquel resulted in oversedation. Nonverbal again after Seroquel initiated.  Changed to nocturnal dosing and despite low-dose no improvement in verbal communication therefore 11/2 Seroquel discontinued  Physical deconditioning -Working with PT and OT-motivated by food (use of chocolate) -Continues to require extensive assist for all ADLs with recommendation for SNF -PT notes significant increase in ambulation distance as of 10/27.   -PT and OT are working with patient. She remains nonverbal and with max assist has been able to walk in halls. And is to place patient out of bed to chair and nursing station daily to improve socialization  Suspected chronic depression in context of ongoing polysubstance abuse prior to admission -History obtained from family that patient has been demonstrating symptoms of depression for several months that had worsened just prior to presentation -Given waxing and waning mental status suspect a combination of recent neurological insult as well as severe depression contributing to mentation issues -Continue Zoloft with 37.5 mg daily  Dislodged gastrostomy tube -Placed by IR on 10/21  Dysphagia (resolved) with persistent inadequate oral intake - SLP recommended regular diet with thin liquids but with waxing and waning alertness I have continued intermittent tube feedings given inconsistent intake of solid diet -Continue nocturnal tube feedings-if patient remains awake during the day can transition to bolus tube feedings based on amount of food eaten versus complete discontinuation of tube feedings if patient consumes at least 50% of meals consistently  Nonobstructive transaminitis/hepatitis C -Elevated HCV antibody with markedly elevated HCV RNA quantitative level  consistent with chronic hepatitis C  -10/25 AST and ALT greater than 100 but total bilirubin normal  Nutrition Status/moderate to  severe protein calorie malnutrition: Nutrition Problem: Inadequate oral intake Etiology: lethargy/confusion Signs/Symptoms: meal completion < 25% Interventions: Tube feeding, Prostat Estimated body mass index is 27.19 kg/m as calculated from the following:   Height as of this encounter: 5\' 5"  (1.651 m).   Weight as of this encounter: 74.1 kg.     Data Reviewed: Basic Metabolic Panel: No results for input(s): NA, K, CL, CO2, GLUCOSE, BUN, CREATININE, CALCIUM, MG, PHOS in the last 168 hours. Liver Function Tests: No results for input(s): AST, ALT, ALKPHOS, BILITOT, PROT, ALBUMIN in the last 168 hours. No results for input(s): LIPASE, AMYLASE in the last 168 hours. No results for input(s): AMMONIA in the last 168 hours. CBC: No results for input(s): WBC, NEUTROABS, HGB, HCT, MCV, PLT in the last 168 hours. Cardiac Enzymes: No results for input(s): CKTOTAL, CKMB, CKMBINDEX, TROPONINI in the last 168 hours. BNP (last 3 results) No results for input(s): BNP in the last 8760 hours.  ProBNP (last 3 results) No results for input(s): PROBNP in the last 8760 hours.  CBG: Recent Labs  Lab 02/23/20 2101 02/23/20 2342 02/24/20 0500 02/24/20 0859 02/24/20 1948  GLUCAP 142* 150* 125* 152* 211*    No results found for this or any previous visit (from the past 240 hour(s)).   Studies: No results found.  Scheduled Meds: . amantadine  100 mg Per Tube BID  . Chlorhexidine Gluconate Cloth  6 each Topical Daily  . famotidine  20 mg Per Tube BID  . feeding supplement  237 mL Oral BID BM  . feeding supplement (PROSource TF)  45 mL Per Tube TID  . free water  200 mL  Per Tube Q6H  . lacosamide  200 mg Per Tube BID  . PHENObarbital  60 mg Per Tube BID  . phenytoin  200 mg Per Tube BID  . polyethylene glycol  17 g Per Tube Daily  . potassium chloride  40 mEq Per Tube Daily  . vitamin B-6  100 mg Per Tube Daily  . senna  1 tablet Per Tube QHS  . sertraline  37.5 mg Per Tube Daily  .  thiamine  100 mg Per Tube Daily   Continuous Infusions:   Principal Problem:   Refractory seizure (HCC) Active Problems:   Acute metabolic encephalopathy   Hypokalemia   Polysubstance abuse (HCC)   Elevated CK   Transaminitis   Chronic hepatitis C without hepatic coma (HCC)   Distended abdomen   Palliative care encounter   Colonic Ileus (HCC)   Organic brain syndrome   On enteral nutrition   Physical deconditioning   Protein-calorie malnutrition (HCC)   Inadequate oral nutritional intake   Consultants:  Neurology  Psychiatry  Interventional radiology  Surgery  Procedures:  9/14 lumbar puncture  9/15 EEG  9/16 EEG  9/17 EEG  9/19 overnight EEG with video  9/22 overnight EEG with video with discontinuation of long-term EEG monitoring on 9/25  9/24 core track placement  10/6 EEG  Antibiotics: Anti-infectives (From admission, onward)   Start     Dose/Rate Route Frequency Ordered Stop   02/11/20 1526  ceFAZolin (ANCEF) IVPB 2g/100 mL premix        2 g 200 mL/hr over 30 Minutes Intravenous  Once 02/11/20 1526 02/11/20 1641   02/05/20 0600  ceFAZolin (ANCEF) IVPB 2g/100 mL premix        2 g 200 mL/hr over 30 Minutes Intravenous To Short Stay 02/04/20 1054 02/05/20 0950   01/29/20 0600  ceFAZolin (ANCEF) IVPB 2g/100 mL premix        2 g 200 mL/hr over 30 Minutes Intravenous On call to O.R. 01/28/20 1511 01/30/20 0559   01/28/20 2000  cefTRIAXone (ROCEPHIN) 1 g in sodium chloride 0.9 % 100 mL IVPB        1 g 200 mL/hr over 30 Minutes Intravenous Every 24 hours 01/28/20 1925 02/02/20 0724   01/06/20 0900  cefTRIAXone (ROCEPHIN) 2 g in sodium chloride 0.9 % 100 mL IVPB  Status:  Discontinued        2 g 200 mL/hr over 30 Minutes Intravenous Every 12 hours 01/06/20 0105 01/06/20 2202   01/06/20 0800  vancomycin (VANCOCIN) IVPB 1000 mg/200 mL premix  Status:  Discontinued       "Followed by" Linked Group Details   1,000 mg 200 mL/hr over 60 Minutes  Intravenous Every 12 hours 01/05/20 1904 01/06/20 2202   01/05/20 2000  vancomycin (VANCOREADY) IVPB 1500 mg/300 mL       "Followed by" Linked Group Details   1,500 mg 150 mL/hr over 120 Minutes Intravenous  Once 01/05/20 1904 01/06/20 0127   01/05/20 1830  cefTRIAXone (ROCEPHIN) 2 g in sodium chloride 0.9 % 100 mL IVPB        2 g 200 mL/hr over 30 Minutes Intravenous  Once 01/05/20 1829 01/05/20 2220       Time spent: 15    Silver Achey ANP  [facility] Pager [phone]. If 7PM-7AM, please contact night-coverage at [email or web address] 02/27/2020, 11:50 AM  LOS: 52 days

## 2020-02-28 NOTE — Progress Notes (Signed)
Pt consumed about 75% of her dinner and then asked for ice cream. She continues to be verbal.

## 2020-02-28 NOTE — Progress Notes (Signed)
Pt had been grimacing this afternoon but did not verbalize what was wrong. I gave oxycodone and ativan. At reassessment approx one hour later, she was noted to be tearful. I checked her for incontinence and she was dry. I suctioned her mouth and repositioned her but interventions not effective. I was about to bladder scan patient to check for retention and while setting up I was asking patient what was wrong and please talk to me. Patient stated very clearly "and say what". Encouraged patient to verbalize her needs and tell this nurse why she was crying and what would help her. She clearly stated, "I have a headache and this binder hurts my skin". Pt also stated "and I have to go to pee". Patient bent her knees and lifted her buttock up without any assistance, bedpan was placed and patient voided without any difficulty. Communication continued about getting stronger, participating and lots of emotional support was provided. Pt was tearful off and on but continued to answer questions and speak clearly. She stated she would use her call bell when she needs to urinate or move her bowels. She also stated she would participate with therapy and psych in the future. We discussed her part in her recovery and showing staff what she is capable of. Pt was agreeable and stated "okay". Tylenol was given for her headache. Pt requested to take it by mouth stating "does it have to go into the tube"? Pt took tylenol orally without any difficulty. Dr. Pola Corn updated.

## 2020-02-28 NOTE — Progress Notes (Signed)
[facility] PROGRESS NOTE  Anita Davis EUM:[id number] DOB: January 13, 1986 DOA: 01/04/2020 PCP: [name], PA-C    Status: Inpatient-Remains inpatient appropriate because:Altered mental status and Unsafe d/c plan    Dispo: The patient is from: Home              Anticipated d/c is to: SNF              Anticipated d/c date is: > 3 days              Patient currently is medically stable to d/c.  Due to UNSAFE DC PLAN-patient needs placement in skilled nursing facility based on altered mentation and inability to manage self-care.  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 Do not expect recovery to previous baseline  Due to inconsistent alertness and thus inadequate oral intake of foods patient will continue G-tube for feedings and medications   Code Status: Full Family Communication: Updated [name] "[address]" [name] by voicemail on 10/25; 11/2 patient's other aunt at bedside today and updated on condition DVT prophylaxis: Lovenox Vaccination status: [name] confirmed pt given [employer] COVID vaccine in June. 2nd vaccine given 11/2  HPI: 34 y.o. F with hx substance use disorder, active, hepatitis C, and gestational diabetes who presented with confusion.  Patient initially admitted in September with encephalopathy, thought to be from drug overdose.  LP showed no signs of infection. MRI brain unremarkable. She remained encephalopathic, and EEG showed focal seizures.  The seizures were suppressed, and she was transferred to [facility] for continuous EEG, but after seizure control, she had poor neurologic recovery.  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  She deteriorated after steroids were stopped, so IVIG was started.  Subjective: Seen and examined this morning.  Propped on bed.   Awake, follows my  command track my finger with her eyes.  Unable to follow any other commands.  Unable to have any other conversation.  Objective: Vitals:   02/28/20 0758 02/28/20 1244  BP: 113/85 (!) 123/92  Pulse: 78 84  Resp: 18 18  Temp: 98.1 F (36.7 C) 97.7 F (36.5 C)  SpO2: 98% 98%    Intake/Output Summary (Last 24 hours) at 02/28/2020 1253 Last data filed at 02/28/2020 0600 Gross per 24 hour  Intake 1595 ml  Output --  Net 1595 ml   Filed Weights   02/23/20 0500 02/24/20 0500 02/27/20 0500  Weight: 72.9 kg 71.4 kg 74.1 kg    Exam: Constitutional: NAD, awake, flat affect, nonverbal Respiratory: clear to auscultation bilaterally, Normal respiratory effort. No accessory muscle use.  Room air Cardiovascular: Regular rate and rhythm. No extremity edema.    Abdomen: no tenderness palpation,G-tube in place with tube feeding infusing.   Musculoskeletal: no clubbing / cyanosis. No contractures.  No tremors or spasticity  Neurologic: Nonverbal, following with neuro alert.  Unable to follow any commands.   Psychiatric: Awake, remains nonverbal -unable to determine orientation.   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 Initially treated as autoimmune encephalitis but despite appropriate treatment including IVIG no significant improvement in symptoms. -At this juncture it is suspected etiology related to sequelae from seizure activity that began prior to admission and persisted in the form of status epilepticus after  admission -Continues with cyclic episodes of wakefulness/speaking then minimal responsiveness/non-verbal -Continue phenobarbital, Vimpat, and Dilantin -Continue empiric thiamine and vitamin B6 replacement -Seroquel initiated due to paranoid behaviors  (primarily verbal expression) and witnessed behaviors concerning for possible hallucinations.  Daytime Seroquel resulted in oversedation. Nonverbal again after Seroquel initiated.  Changed to nocturnal dosing and despite low-dose no improvement in verbal communication therefore 11/2 Seroquel discontinued  Physical deconditioning -Working with PT and OT-motivated by food (use of chocolate) -Continues to require extensive assist for all ADLs with recommendation for SNF -PT notes significant increase in ambulation distance as of 10/27.   -PT and OT are working with patient. She remains nonverbal and with max assist has been able to walk in halls. And is to place patient out of bed to chair and nursing station daily to improve socialization  Suspected chronic depression in context of ongoing polysubstance abuse prior to admission -History obtained from family that patient has been demonstrating symptoms of depression for several months that had worsened just prior to presentation -Given waxing and waning mental status suspect a combination of recent neurological insult as well as severe depression contributing to mentation issues -Continue Zoloft with 37.5 mg daily  Dislodged gastrostomy tube -Placed by IR on 10/21  Dysphagia (resolved) with persistent inadequate oral intake - SLP recommended regular diet with thin liquids but with waxing and waning alertness I have continued intermittent tube feedings given inconsistent intake of solid diet -Continue nocturnal tube feedings-if patient remains awake during the day can transition to bolus tube feedings based on amount of food eaten versus complete discontinuation of tube feedings if patient consumes at least 50% of meals consistently  Nonobstructive transaminitis/hepatitis C -Elevated HCV antibody with markedly elevated HCV RNA quantitative level  consistent with chronic hepatitis C  -10/25 AST and ALT greater than 100 but total bilirubin  normal  Nutrition Status/moderate to severe protein calorie malnutrition: Nutrition Problem: Inadequate oral intake Etiology: lethargy/confusion Signs/Symptoms: meal completion < 25% Interventions: Tube feeding, Prostat Estimated body mass index is 27.19 kg/m as calculated from the following:   Height as of this encounter: 5\' 5"  (1.651 m).   Weight as of this encounter: 74.1 kg.     Data Reviewed: Basic Metabolic Panel: No results for input(s): NA, K, CL, CO2, GLUCOSE, BUN, CREATININE, CALCIUM, MG, PHOS in the last 168 hours. Liver Function Tests: No results for input(s): AST, ALT, ALKPHOS, BILITOT, PROT, ALBUMIN in the last 168 hours. No results for input(s): LIPASE, AMYLASE in the last 168 hours. No results for input(s): AMMONIA in the last 168 hours. CBC: No results for input(s): WBC, NEUTROABS, HGB, HCT, MCV, PLT in the last 168 hours. Cardiac Enzymes: No results for input(s): CKTOTAL, CKMB, CKMBINDEX, TROPONINI in the last 168 hours. BNP (last 3 results) No results for input(s): BNP in the last 8760 hours.  ProBNP (last 3 results) No results for input(s): PROBNP in the last 8760 hours.  CBG: Recent Labs  Lab 02/23/20 2101 02/23/20 2342 02/24/20 0500 02/24/20 0859 02/24/20 1948  GLUCAP 142* 150* 125* 152* 211*    No results found for this or any previous visit (from the past 240 hour(s)).   Studies: No results found.  Scheduled Meds: . amantadine  100 mg Per Tube BID  . Chlorhexidine Gluconate Cloth  6 each Topical Daily  . famotidine  20 mg Per Tube BID  . feeding supplement  237 mL Oral BID BM  . feeding supplement (PROSource TF)  45 mL Per Tube TID  .  free water  200 mL Per Tube Q6H  . lacosamide  200 mg Per Tube BID  . PHENObarbital  60 mg Per Tube BID  . phenytoin  200 mg Per Tube BID  . polyethylene glycol  17 g Per Tube Daily  . potassium chloride  40 mEq Per Tube Daily  . vitamin B-6  100 mg Per Tube Daily  . senna  1 tablet Per Tube QHS  .  sertraline  37.5 mg Per Tube Daily  . thiamine  100 mg Per Tube Daily   Continuous Infusions:   Principal Problem:   Refractory seizure (HCC) Active Problems:   Acute metabolic encephalopathy   Hypokalemia   Polysubstance abuse (HCC)   Elevated CK   Transaminitis   Chronic hepatitis C without hepatic coma (HCC)   Distended abdomen   Palliative care encounter   Colonic Ileus (HCC)   Organic brain syndrome   On enteral nutrition   Physical deconditioning   Protein-calorie malnutrition (HCC)   Inadequate oral nutritional intake   Consultants:  Neurology  Psychiatry  Interventional radiology  Surgery  Procedures:  9/14 lumbar puncture  9/15 EEG  9/16 EEG  9/17 EEG  9/19 overnight EEG with video  9/22 overnight EEG with video with discontinuation of long-term EEG monitoring on 9/25  9/24 core track placement  10/6 EEG  Antibiotics: Anti-infectives (From admission, onward)   Start     Dose/Rate Route Frequency Ordered Stop   02/11/20 1526  ceFAZolin (ANCEF) IVPB 2g/100 mL premix        2 g 200 mL/hr over 30 Minutes Intravenous  Once 02/11/20 1526 02/11/20 1641   02/05/20 0600  ceFAZolin (ANCEF) IVPB 2g/100 mL premix        2 g 200 mL/hr over 30 Minutes Intravenous To Short Stay 02/04/20 1054 02/05/20 0950   01/29/20 0600  ceFAZolin (ANCEF) IVPB 2g/100 mL premix        2 g 200 mL/hr over 30 Minutes Intravenous On call to O.R. 01/28/20 1511 01/30/20 0559   01/28/20 2000  cefTRIAXone (ROCEPHIN) 1 g in sodium chloride 0.9 % 100 mL IVPB        1 g 200 mL/hr over 30 Minutes Intravenous Every 24 hours 01/28/20 1925 02/02/20 0724   01/06/20 0900  cefTRIAXone (ROCEPHIN) 2 g in sodium chloride 0.9 % 100 mL IVPB  Status:  Discontinued        2 g 200 mL/hr over 30 Minutes Intravenous Every 12 hours 01/06/20 0105 01/06/20 2202   01/06/20 0800  vancomycin (VANCOCIN) IVPB 1000 mg/200 mL premix  Status:  Discontinued       "Followed by" Linked Group Details   1,000  mg 200 mL/hr over 60 Minutes Intravenous Every 12 hours 01/05/20 1904 01/06/20 2202   01/05/20 2000  vancomycin (VANCOREADY) IVPB 1500 mg/300 mL       "Followed by" Linked Group Details   1,500 mg 150 mL/hr over 120 Minutes Intravenous  Once 01/05/20 1904 01/06/20 0127   01/05/20 1830  cefTRIAXone (ROCEPHIN) 2 g in sodium chloride 0.9 % 100 mL IVPB        2 g 200 mL/hr over 30 Minutes Intravenous  Once 01/05/20 1829 01/05/20 2220       Time spent: 15    Tyvion Edmondson ANP  [facility] Pager [phone]. If 7PM-7AM, please contact night-coverage at [email or web address] 02/28/2020, 12:53 PM  LOS: 53 days

## 2020-02-29 MED ORDER — SERTRALINE HCL 100 MG PO TABS
100.0000 mg | ORAL_TABLET | Freq: Every day | ORAL | Status: DC
  Administered 2020-03-01 – 2020-03-15 (×15): 100 mg
  Filled 2020-02-29 (×15): qty 1

## 2020-02-29 MED ORDER — SERTRALINE HCL 50 MG PO TABS: 75.0000 mg | ORAL_TABLET | Freq: Every day | ORAL | Status: DC

## 2020-02-29 NOTE — Consult Note (Signed)
Patient seen and attempted to reassess on evaluation.  Patient remains nonverbal on assessment.  Patient was able to make eye contact, however it remains unclear if this was involuntary as she was not able to make eye contact when directed.  Patient was also observed to be displaying involuntary movements of her distal extremities.  At this time we are unable to assess her current mentation.  Please consult when able to participate in an assessment.  There was also mention of increasing her medication to treat her presumed severe depression.  Patient was currently taking sertraline 37.5 mg p.o. daily, will increase to sertraline 100 to further target symptoms of depression.  Psychiatry to sign off at this time.

## 2020-02-29 NOTE — Plan of Care (Signed)
  Problem: Clinical Measurements: Goal: Respiratory complications will improve Outcome: Progressing   Problem: Activity: Goal: Risk for activity intolerance will decrease Outcome: Progressing   Problem: Nutrition: Goal: Adequate nutrition will be maintained Outcome: Progressing   Problem: Coping: Goal: Level of anxiety will decrease Outcome: Progressing   Problem: Pain Managment: Goal: General experience of comfort will improve Outcome: Progressing   Problem: Skin Integrity: Goal: Risk for impaired skin integrity will decrease Outcome: Progressing   Problem: Safety: Goal: Ability to remain free from injury will improve Outcome: Progressing   Problem: Self-Concept: Goal: Level of anxiety will decrease Outcome: Progressing

## 2020-02-29 NOTE — Progress Notes (Signed)
[facility] PROGRESS NOTE  Anita Davis ZOX:[id number] DOB: 1985/06/16 DOA: 01/04/2020 PCP: Jacquelin HawkingMcElroy, Shannon, PA-C     10/20 nonverbal and not making eye contact or following simple commands     10/25: Anita ChessmanMandy much more alert today.  Eventually she was able to talk and express her needs.  Subsequently she was rolled out to the nurses station to continue to improve her socialization.  She was requesting a Covid vaccine.   Status: Inpatient-Remains inpatient appropriate because:Altered mental status and Unsafe d/c plan    Dispo: The patient is from: Home              Anticipated d/c is to: SNF              Anticipated d/c date is: > 3 days              Patient currently is medically stable to d/c.  Due to UNSAFE DC PLAN-patient needs placement in skilled nursing facility based on altered mentation and inability to manage self-care.  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 Do not expect recovery to previous baseline  Due to inconsistent alertness and thus inadequate oral intake of foods patient will continue G-tube for feedings and medications   Code Status: Full Family Communication: Updated Anita Manislizabeth "[id number]Dee Dee" [name] by voicemail on 10/25; 11/2 patient's other aunt at bedside today and updated on condition DVT prophylaxis: Lovenox Vaccination status: [name] confirmed pt given [employer]st Pfizer COVID vaccine in June. 2nd vaccine given 11/2  HPI: 34 y.o. F with hx substance use disorder, active, hepatitis C, and gestational diabetes who presented with confusion.  Patient initially admitted in September with encephalopathy, thought to be from drug overdose.  LP showed no signs of infection. MRI brain unremarkable. She remained encephalopathic, and EEG showed focal seizures.  The seizures were suppressed, and [facility], but after seizure control, she  had poor neurologic recovery.  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  She deteriorated after steroids were stopped, so IVIG was started.  Subjective: Apparently had episode yesterday afternoon when she was alert and appropriately verbally conversing.  Eating food.  Labile emotions and crying at times.  As of this morning she is tracking examiner in the room.  She does make eye contact when spoken to but does not attempt to verbalize or follow commands.  Objective: Vitals:   02/29/20 1242 02/29/20 1244  BP: 120/77   Pulse: 85 81  Resp:    Temp:    SpO2:  99%    Intake/Output Summary (Last 24 hours) at 02/29/2020 1247 Last data filed at 02/29/2020 1005 Gross per 24 hour  Intake 1025 ml  Output 200 ml  Net 825 ml   Filed Weights   02/23/20 0500 02/24/20 0500 02/27/20 0500  Weight: 72.9 kg 71.4 kg 74.1 kg    Exam: Constitutional: NAD, awake, flat affect, nonverbal Respiratory: clear to auscultation bilaterally, Normal respiratory effort. No accessory muscle use.  Room air Cardiovascular: Regular rate and rhythm. No extremity edema.    Abdomen: no tenderness palpation,G-tube in place with tube feeding infusing.   Musculoskeletal: no clubbing / cyanosis. No contractures.  No tremors or spasticity  Neurologic: CN 2-12 grossly intact, currently not following simple commands but does have spontaneous movement of extremities Psychiatric: Awake, nonverbal, does look at examiner when spoken to and  tracks activity in room.   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 Initially treated as autoimmune encephalitis but despite appropriate treatment including IVIG no significant improvement  in symptoms. -At this juncture it is suspected etiology related to sequelae from seizure activity that began prior to admission and persisted in the form of status epilepticus after admission.  Has had follow-up EEG in the past 30 days without any reemergence of seizure activities. -Continues with cyclic episodes of wakefulness/speaking then minimal responsiveness/non-verbal -Continue phenobarbital, Vimpat, and Dilantin -Continue empiric thiamine and vitamin B6 replacement -Seroquel initiated due to paranoid behaviors (primarily verbal expression) and witnessed behaviors concerning for possible hallucinations but due to daytime sleepiness and return to nonverbal state this medication was discontinued  Physical deconditioning -Working with PT and OT-motivated by food (use of chocolate) -Continues to require extensive assist for all ADLs with recommendation for SNF -PT notes significant increase in ambulation distance as of 10/27.   -PT and OT are working with patient. She remains nonverbal and with max assist has been able to walk in halls. And is to place patient out of bed to chair and nursing station daily to improve socialization  Suspected chronic depression in context of ongoing polysubstance abuse prior to admission -History obtained from family that patient has been demonstrating symptoms of depression for several months that had worsened just prior to presentation -Given waxing and waning mental status suspect a combination of recent neurological insult as well as severe depression contributing to mentation issues -Continue Zoloft with 37.5 mg daily -Levan/8 have asked psychiatry to come by and review medications to determine if patient is on appropriate medications to treat severe depression  Dislodged gastrostomy tube -Placed by IR on 10/21  Dysphagia (resolved) with persistent inadequate oral intake - SLP recommended regular diet with thin liquids but with waxing and waning  alertness I have continued intermittent tube feedings given inconsistent intake of solid diet -Continue nocturnal tube feedings-if patient remains awake during the day can transition to bolus tube feedings based on amount of food eaten versus complete discontinuation of tube feedings if patient consumes at least 50% of meals consistently  Nonobstructive transaminitis/hepatitis C -Elevated HCV antibody with markedly elevated HCV RNA quantitative level  consistent with chronic hepatitis C  -10/25 AST and ALT greater than 100 but total bilirubin normal  Nutrition Status/moderate to severe protein calorie malnutrition: Nutrition Problem: Inadequate oral intake Etiology: lethargy/confusion Signs/Symptoms: meal completion < 25% Interventions: Tube feeding, Prostat Estimated body mass index is 27.19 kg/m as calculated from the following:   Height as of this encounter: 5\' 5"  (1.651 m).   Weight as of this encounter: 74.1 kg.     Data Reviewed: Basic Metabolic Panel: No results for input(s): NA, K, CL, CO2, GLUCOSE, BUN, CREATININE, CALCIUM, MG, PHOS in the last 168 hours. Liver Function Tests: No results for input(s): AST, ALT, ALKPHOS, BILITOT, PROT, ALBUMIN in the last 168 hours. No results for input(s): LIPASE, AMYLASE in the last 168 hours. No results for input(s): AMMONIA in the last 168 hours. CBC: No results for input(s): WBC, NEUTROABS, HGB, HCT, MCV, PLT in the last 168 hours. Cardiac Enzymes: No results for input(s): CKTOTAL, CKMB, CKMBINDEX, TROPONINI in the last 168 hours. BNP (last 3 results) No results for input(s): BNP in the last 8760 hours.  ProBNP (last 3 results) No results for input(s): PROBNP  in the last 8760 hours.  CBG: Recent Labs  Lab 02/23/20 2101 02/23/20 2342 02/24/20 0500 02/24/20 0859 02/24/20 1948  GLUCAP 142* 150* 125* 152* 211*    No results found for this or any previous visit (from the past 240 hour(s)).   Studies: No results  found.  Scheduled Meds: . amantadine  100 mg Per Tube BID  . Chlorhexidine Gluconate Cloth  6 each Topical Daily  . famotidine  20 mg Per Tube BID  . feeding supplement  237 mL Oral BID BM  . feeding supplement (PROSource TF)  45 mL Per Tube TID  . free water  200 mL Per Tube Q6H  . lacosamide  200 mg Per Tube BID  . PHENObarbital  60 mg Per Tube BID  . phenytoin  200 mg Per Tube BID  . polyethylene glycol  17 g Per Tube Daily  . potassium chloride  40 mEq Per Tube Daily  . vitamin B-6  100 mg Per Tube Daily  . senna  1 tablet Per Tube QHS  . [START ON 03/01/2020] sertraline  75 mg Per Tube Daily  . thiamine  100 mg Per Tube Daily   Continuous Infusions:   Principal Problem:   Refractory seizure (HCC) Active Problems:   Acute metabolic encephalopathy   Hypokalemia   Polysubstance abuse (HCC)   Elevated CK   Transaminitis   Chronic hepatitis C without hepatic coma (HCC)   Distended abdomen   Palliative care encounter   Colonic Ileus (HCC)   Organic brain syndrome   On enteral nutrition   Physical deconditioning   Protein-calorie malnutrition (HCC)   Inadequate oral nutritional intake   Consultants:  Neurology  Psychiatry  Interventional radiology  Surgery  Procedures:  9/14 lumbar puncture  9/15 EEG  9/16 EEG  9/17 EEG  9/19 overnight EEG with video  9/22 overnight EEG with video with discontinuation of long-term EEG monitoring on 9/25  9/24 core track placement  10/6 EEG  Antibiotics: Anti-infectives (From admission, onward)   Start     Dose/Rate Route Frequency Ordered Stop   02/11/20 1526  ceFAZolin (ANCEF) IVPB 2g/100 mL premix        2 g 200 mL/hr over 30 Minutes Intravenous  Once 02/11/20 1526 02/11/20 1641   02/05/20 0600  ceFAZolin (ANCEF) IVPB 2g/100 mL premix        2 g 200 mL/hr over 30 Minutes Intravenous To Short Stay 02/04/20 1054 02/05/20 0950   01/29/20 0600  ceFAZolin (ANCEF) IVPB 2g/100 mL premix        2 g 200 mL/hr over  30 Minutes Intravenous On call to O.R. 01/28/20 1511 01/30/20 0559   01/28/20 2000  cefTRIAXone (ROCEPHIN) 1 g in sodium chloride 0.9 % 100 mL IVPB        1 g 200 mL/hr over 30 Minutes Intravenous Every 24 hours 01/28/20 1925 02/02/20 0724   01/06/20 0900  cefTRIAXone (ROCEPHIN) 2 g in sodium chloride 0.9 % 100 mL IVPB  Status:  Discontinued        2 g 200 mL/hr over 30 Minutes Intravenous Every 12 hours 01/06/20 0105 01/06/20 2202   01/06/20 0800  vancomycin (VANCOCIN) IVPB 1000 mg/200 mL premix  Status:  Discontinued       "Followed by" Linked Group Details   1,000 mg 200 mL/hr over 60 Minutes Intravenous Every 12 hours 01/05/20 1904 01/06/20 2202   01/05/20 2000  vancomycin (VANCOREADY) IVPB 1500 mg/300 mL       "  Followed by" Linked Group Details   1,500 mg 150 mL/hr over 120 Minutes Intravenous  Once 01/05/20 1904 01/06/20 0127   01/05/20 1830  cefTRIAXone (ROCEPHIN) 2 g in sodium chloride 0.9 % 100 mL IVPB        2 g 200 mL/hr over 30 Minutes Intravenous  Once 01/05/20 1829 01/05/20 2220       Time spent: 15    [name] ANP  [facility] Pager [phone]. If 7PM-7AM, please contact night-coverage at [email or web address] 02/29/2020, 12:47 PM  LOS: 54 days

## 2020-03-01 NOTE — Plan of Care (Signed)
Pt is not progressing towards goals of care at this time due to altered mental status.   Problem: Education: Goal: Knowledge of General Education information will improve Description: Including pain rating scale, medication(s)/side effects and non-pharmacologic comfort measures Outcome: Not Progressing   Problem: Health Behavior/Discharge Planning: Goal: Ability to manage health-related needs will improve Outcome: Not Progressing   Problem: Clinical Measurements: Goal: Ability to maintain clinical measurements within normal limits will improve Outcome: Not Progressing Goal: Will remain free from infection Outcome: Not Progressing Goal: Diagnostic test results will improve Outcome: Not Progressing Goal: Respiratory complications will improve Outcome: Not Progressing Goal: Cardiovascular complication will be avoided Outcome: Not Progressing   Problem: Activity: Goal: Risk for activity intolerance will decrease Outcome: Not Progressing   Problem: Nutrition: Goal: Adequate nutrition will be maintained Outcome: Not Progressing   Problem: Coping: Goal: Level of anxiety will decrease Outcome: Not Progressing   Problem: Elimination: Goal: Will not experience complications related to bowel motility Outcome: Not Progressing Goal: Will not experience complications related to urinary retention Outcome: Not Progressing   Problem: Pain Managment: Goal: General experience of comfort will improve Outcome: Not Progressing   Problem: Safety: Goal: Ability to remain free from injury will improve Outcome: Not Progressing   Problem: Skin Integrity: Goal: Risk for impaired skin integrity will decrease Outcome: Not Progressing   Problem: Education: Goal: Expressions of having a comfortable level of knowledge regarding the disease process will increase Outcome: Not Progressing   Problem: Coping: Goal: Ability to adjust to condition or change in health will improve Outcome: Not  Progressing Goal: Ability to identify appropriate support needs will improve Outcome: Not Progressing   Problem: Health Behavior/Discharge Planning: Goal: Compliance with prescribed medication regimen will improve Outcome: Not Progressing   Problem: Medication: Goal: Risk for medication side effects will decrease Outcome: Not Progressing   Problem: Clinical Measurements: Goal: Complications related to the disease process, condition or treatment will be avoided or minimized Outcome: Not Progressing Goal: Diagnostic test results will improve Outcome: Not Progressing   Problem: Safety: Goal: Verbalization of understanding the information provided will improve Outcome: Not Progressing   Problem: Self-Concept: Goal: Level of anxiety will decrease Outcome: Not Progressing Goal: Ability to verbalize feelings about condition will improve Outcome: Not Progressing

## 2020-03-01 NOTE — Plan of Care (Signed)

## 2020-03-01 NOTE — Progress Notes (Signed)
Physical Therapy Treatment Patient Details Name: Trenity Pha MRN: [id number] DOB: September 29, 1985 Today's Date: 03/01/2020    History of Present Illness 34 year old with past medical history significant for hepatitis C, polysubstance abuse brought to the hospital 9/13 for disorientation which was suspected to be substance abuse.  After she became more lucid, she was discharged, but then police brought her back later in the day and they found her passed out in a parking lot.  Urine drug screen was positive for benzodiazepine and THC. An EEG done on 9/15 showed patient was in status epilepticus.  Patient was a started on Keppra.  Despite these, EEG noted continued seizures requiring additional medications as well.  Finally on 918, seizures broke. Follow-up EEG on 9/20 noted evidence of epileptic [name] from the left central temporal region.  Repeat EEG done on 9/22 noted epileptogenic city from the left central temporal region.  Pt with PEG placed on 02/05/20.     PT Comments    Pt received in recliner at nurses' station. She has been picking at her face today and has noted wound L side of her mouth. Currently drinking ensure and then spitting most of it into her hand and rubbing it on blanket beside her. Pt with minimal verbalization this session. Max A for sit>stand 3x from recliner and bed. Attempted to progress ambulation with RW but pt not stepping feet forward, only small steps in place. Pivoted to bed with max A. Pt did follow commands to grasp rail with rolling in bed as well as participating minimally in supine LE there ex with manual resistance. PT will continue to follow.    Follow Up Recommendations  SNF;Supervision/Assistance - 24 hour     Equipment Recommendations  Wheelchair (measurements PT);Wheelchair cushion (measurements PT);Hospital bed;Other (comment);3in1 (PT)    Recommendations for Other Services       Precautions / Restrictions Precautions Precautions: Fall Precaution  Comments: Seizure, PEG abdominal binder Restrictions Weight Bearing Restrictions: No    Mobility  Bed Mobility Overal bed mobility: Needs Assistance Bed Mobility: Sit to Supine Rolling: Mod assist     Sit to supine: Max assist   General bed mobility comments: sit to supine with max A at LE's and then upper body to straighten in bed. Pt rolled to each side for binder to be repositioned, with mod A and did follow commands to hold rail  Transfers Overall transfer level: Needs assistance Equipment used: 1 person hand held assist Transfers: Sit to/from [employer] Transfers Sit to Stand: Max assist Stand pivot transfers: Max assist       General transfer comment: max A for fwd wt shift and power up. Performed 3x for practice, same assist needed. Cues to step feef yielded pt moving feet bkwds slightly towards bed but not enough stepping to progress ambulation.  Ambulation/Gait             General Gait Details: attempted to have pt stand to RW to ambulate but she was not stepping feet in fwd motion enough to get anywhere.    Stairs             Wheelchair Mobility    Modified Rankin (Stroke Patients Only)       Balance Overall balance assessment: Needs assistance Sitting-balance support: Single extremity supported;Feet supported Sitting balance-Leahy Scale: Fair Sitting balance - Comments: close supervision sitting EOB   Standing balance support: Bilateral upper extremity supported Standing balance-Leahy Scale: Zero Standing balance comment: posterior lean in standing, max A  Cognition Arousal/Alertness: Awake/alert Behavior During Therapy: Flat affect Overall Cognitive Status: Impaired/Different from baseline Area of Impairment: Attention;Following commands;Safety/judgement;Problem solving;Awareness                 Orientation Level: Disoriented to;Place;Time;Situation Current Attention Level: Focused    Following Commands: Follows one step commands inconsistently Safety/Judgement: Decreased awareness of safety;Decreased awareness of deficits Awareness: Intellectual Problem Solving: Slow processing;Requires verbal cues;Difficulty sequencing;Decreased initiation;Requires tactile cues General Comments: pt followed 2 simple mobility commands but otherwise did not engage      Exercises Other Exercises Other Exercises: passive hip and knee flexion with cues to extend knee and hip, pt participated with this minimally 3x each side Other Exercises: passive SLR with cues for pt to push LE down to bed, 3x each side    General Comments General comments (skin integrity, edema, etc.): pt picking at skin around mouth while sitting in recliner, now with noted wound L side of mouth      Pertinent Vitals/Pain Pain Assessment: Faces Faces Pain Scale: No hurt    Home Living                      Prior Function            PT Goals (current goals can now be found in the care plan section) Acute Rehab PT Goals Patient Stated Goal: unable to state PT Goal Formulation: Patient unable to participate in goal setting Potential to Achieve Goals: Fair Progress towards PT goals: Progressing toward goals    Frequency    Min 2X/week      PT Plan Current plan remains appropriate    Co-evaluation              AM-PAC PT "6 Clicks" Mobility   Outcome Measure  Help needed turning from your back to your side while in a flat bed without using bedrails?: A Lot Help needed moving from lying on your back to sitting on the side of a flat bed without using bedrails?: A Lot Help needed moving to and from a bed to a chair (including a wheelchair)?: A Lot Help needed standing up from a chair using your arms (e.g., wheelchair or bedside chair)?: A Lot Help needed to walk in hospital room?: Total Help needed climbing 3-5 steps with a railing? : Total 6 Click Score: 10    End of Session  Equipment Utilized During Treatment: Gait belt Activity Tolerance: Patient tolerated treatment well Patient left: with call bell/phone within reach;in bed;with bed alarm set Nurse Communication: Mobility status PT Visit Diagnosis: Unsteadiness on feet (R26.81);Muscle weakness (generalized) (M62.81);Difficulty in walking, not elsewhere classified (R26.2);Other symptoms and signs involving the nervous system (R29.898)     Time: 4656-8127 PT Time Calculation (min) (ACUTE ONLY): 20 min  Charges:  $Therapeutic Activity: 8-22 mins                     Lyanne Co, PT  Acute Rehab Services  Pager [phone] Office [phone]    Lawana Chambers Tanaysia Bhardwaj 03/01/2020, 5:09 PM

## 2020-03-01 NOTE — Progress Notes (Signed)
[facility] PROGRESS NOTE  Anita Davis XQJ:[id number] DOB: November 12, 1985 DOA: 01/04/2020 PCP: [name], PA-C     10/20 nonverbal and not making eye contact or following simple commands     10/25: Anita Davis much more alert today.  Eventually she was able to talk and express her needs.  Subsequently she was rolled out to the nurses station to continue to improve her socialization.  She was requesting a Covid vaccine.   Status: Inpatient-Remains inpatient appropriate because:Altered mental status and Unsafe d/c plan    Dispo: The patient is from: Home              Anticipated d/c is to: SNF              Anticipated d/c date is: > 3 days              Patient currently is medically stable to d/c.  Due to UNSAFE DC PLAN-patient needs placement in skilled nursing facility based on altered mentation and inability to manage self-care.  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 Do not expect recovery to previous baseline  Due to inconsistent alertness and thus inadequate oral intake of foods patient will continue G-tube for feedings and medications   Code Status: Full Family Communication: Updated Anita Davis "[address]" [name] by voicemail on 10/25; 11/2 patient's other aunt at bedside today and updated on condition DVT prophylaxis: Lovenox Vaccination status: Anita Davis confirmed pt given [employer] COVID vaccine in June. 2nd vaccine given 11/2  HPI: 34 y.o. F with hx substance use disorder, active, hepatitis C, and gestational diabetes who presented with confusion.  Patient initially admitted in September with encephalopathy, thought to be from drug overdose.  LP showed no signs of infection. MRI brain unremarkable. She remained encephalopathic, and EEG showed focal seizures.  The seizures were suppressed, and she was transferred to [facility] for continuous EEG, but after seizure control, she  had poor neurologic recovery.  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  She deteriorated after steroids were stopped, so IVIG was started.  Subjective: Alert and fidgeting in the bed.  Noted that patient alternate of stool.  Despite multiple prompts unable to get her to respond verbally including stating that she had had a stool.  Objective: Vitals:   03/01/20 0342 03/01/20 0839  BP: 120/80 (!) 127/104  Pulse: 70 90  Resp: 15 18  Temp: (!) 97.5 F (36.4 C) 98.5 F (36.9 C)  SpO2: 99% 99%    Intake/Output Summary (Last 24 hours) at 03/01/2020 0916 Last data filed at 02/29/2020 1216 Gross per 24 hour  Intake 170 ml  Output 0 ml  Net 170 ml   Filed Weights   02/23/20 0500 02/24/20 0500 02/27/20 0500  Weight: 72.9 kg 71.4 kg 74.1 kg    Exam: Constitutional: NAD, awake, restless, nonverbal Respiratory: clear to auscultation bilaterally, Normal respiratory effort. No accessory muscle use.  Room air Cardiovascular: Regular rate and rhythm. No extremity edema.    Abdomen: no tenderness palpation,G-tube in place with tube feeding infusing.  Continent of stool. Musculoskeletal: no clubbing / cyanosis. No contractures.  No tremors or spasticity  Neurologic: CN 2-12 grossly intact, currently not following simple commands but does have spontaneous movement of extremities Psychiatric: Awake, nonverbal, does make eye contact when spoken to but does not attempt to follow commands.   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 Initially treated as autoimmune encephalitis but despite appropriate treatment including IVIG no significant improvement in symptoms. -At this juncture it is  suspected etiology related to sequelae from seizure activity that began prior to admission and persisted in the form of status epilepticus after admission.  Has had follow-up EEG in the past 30 days without any reemergence of seizure activities. -Continues with cyclic episodes of wakefulness/speaking then minimal responsiveness/non-verbal -Continue phenobarbital, Vimpat, and Dilantin -Continue empiric thiamine and vitamin B6 replacement -Seroquel initiated due to paranoid behaviors (primarily verbal expression) and witnessed behaviors concerning for possible hallucinations but due to daytime sleepiness and return to nonverbal state this medication was discontinued  Physical deconditioning -Working with PT and OT-motivated by food (use of chocolate) -Continues to require extensive assist for all ADLs with recommendation for SNF -PT notes significant increase in ambulation distance as of 10/27.   -PT and OT are working with patient. She remains nonverbal and with max assist has been able to walk in halls. And is to place patient out of bed to chair and nursing station daily to improve socialization  Suspected chronic depression in context of ongoing polysubstance abuse prior to admission -History obtained from family that patient has been demonstrating symptoms of depression for several months that had worsened just prior to presentation -Waxing and waning mental status 2/2 neurological insult and severe depression  -11/8 was evaluated by psych who increased Zoloft dose to 100 mg daily  Dislodged gastrostomy tube -Placed by IR on 10/21  Dysphagia (resolved) with persistent inadequate oral intake - SLP recommended regular diet with thin liquids but with waxing and waning alertness I have continued intermittent tube feedings given inconsistent intake of solid diet -Continue nocturnal tube feedings-if patient remains awake during the day can transition to bolus tube feedings based on amount of food  eaten versus complete discontinuation of tube feedings if patient consumes at least 50% of meals consistently  Nonobstructive transaminitis/hepatitis C -Elevated HCV antibody with markedly elevated HCV RNA quantitative level  consistent with chronic hepatitis C  -10/25 AST and ALT greater than 100 but total bilirubin normal  Nutrition Status/moderate to severe protein calorie malnutrition: Nutrition Problem: Inadequate oral intake Etiology: lethargy/confusion Signs/Symptoms: meal completion < 25% Interventions: Tube feeding, Prostat Estimated body mass index is 27.19 kg/m as calculated from the following:   Height as of this encounter: 5\' 5"  (1.651 m).   Weight as of this encounter: 74.1 kg.     Data Reviewed: Basic Metabolic Panel: No results for input(s): NA, K, CL, CO2, GLUCOSE, BUN, CREATININE, CALCIUM, MG, PHOS in the last 168 hours. Liver Function Tests: No results for input(s): AST, ALT, ALKPHOS, BILITOT, PROT, ALBUMIN in the last 168 hours. No results for input(s): LIPASE, AMYLASE in the last 168 hours. No results for input(s): AMMONIA in the last 168 hours. CBC: No results for input(s): WBC, NEUTROABS, HGB, HCT, MCV, PLT in the last 168 hours. Cardiac Enzymes: No results for input(s): CKTOTAL, CKMB, CKMBINDEX, TROPONINI in the last 168 hours. BNP (last 3 results) No results for input(s): BNP in the last 8760 hours.  ProBNP (last 3 results) No results for input(s): PROBNP in the last 8760 hours.  CBG: Recent Labs  Lab 02/23/20 2101 02/23/20 2342 02/24/20 0500 02/24/20 0859 02/24/20 1948  GLUCAP 142* 150* 125* 152* 211*    No results found for  this or any previous visit (from the past 240 hour(s)).   Studies: No results found.  Scheduled Meds: . amantadine  100 mg Per Tube BID  . Chlorhexidine Gluconate Cloth  6 each Topical Daily  . famotidine  20 mg Per Tube BID  . feeding supplement  237 mL Oral BID BM  . feeding supplement (PROSource TF)  45 mL Per  Tube TID  . free water  200 mL Per Tube Q6H  . lacosamide  200 mg Per Tube BID  . PHENObarbital  60 mg Per Tube BID  . phenytoin  200 mg Per Tube BID  . polyethylene glycol  17 g Per Tube Daily  . potassium chloride  40 mEq Per Tube Daily  . vitamin B-6  100 mg Per Tube Daily  . senna  1 tablet Per Tube QHS  . sertraline  100 mg Per Tube Daily  . thiamine  100 mg Per Tube Daily   Continuous Infusions:   Principal Problem:   Refractory seizure (HCC) Active Problems:   Acute metabolic encephalopathy   Hypokalemia   Polysubstance abuse (HCC)   Elevated CK   Transaminitis   Chronic hepatitis C without hepatic coma (HCC)   Distended abdomen   Palliative care encounter   Colonic Ileus (HCC)   Organic brain syndrome   On enteral nutrition   Physical deconditioning   Protein-calorie malnutrition (HCC)   Inadequate oral nutritional intake   Consultants:  Neurology  Psychiatry  Interventional radiology  Surgery  Procedures:  9/14 lumbar puncture  9/15 EEG  9/16 EEG  9/17 EEG  9/19 overnight EEG with video  9/22 overnight EEG with video with discontinuation of long-term EEG monitoring on 9/25  9/24 core track placement  10/6 EEG  Antibiotics: Anti-infectives (From admission, onward)   Start     Dose/Rate Route Frequency Ordered Stop   02/11/20 1526  ceFAZolin (ANCEF) IVPB 2g/100 mL premix        2 g 200 mL/hr over 30 Minutes Intravenous  Once 02/11/20 1526 02/11/20 1641   02/05/20 0600  ceFAZolin (ANCEF) IVPB 2g/100 mL premix        2 g 200 mL/hr over 30 Minutes Intravenous To Short Stay 02/04/20 1054 02/05/20 0950   01/29/20 0600  ceFAZolin (ANCEF) IVPB 2g/100 mL premix        2 g 200 mL/hr over 30 Minutes Intravenous On call to O.R. 01/28/20 1511 01/30/20 0559   01/28/20 2000  cefTRIAXone (ROCEPHIN) 1 g in sodium chloride 0.9 % 100 mL IVPB        1 g 200 mL/hr over 30 Minutes Intravenous Every 24 hours 01/28/20 1925 02/02/20 0724   01/06/20 0900   cefTRIAXone (ROCEPHIN) 2 g in sodium chloride 0.9 % 100 mL IVPB  Status:  Discontinued        2 g 200 mL/hr over 30 Minutes Intravenous Every 12 hours 01/06/20 0105 01/06/20 2202   01/06/20 0800  vancomycin (VANCOCIN) IVPB 1000 mg/200 mL premix  Status:  Discontinued       "Followed by" Linked Group Details   1,000 mg 200 mL/hr over 60 Minutes Intravenous Every 12 hours 01/05/20 1904 01/06/20 2202   01/05/20 2000  vancomycin (VANCOREADY) IVPB 1500 mg/300 mL       "Followed by" Linked Group Details   1,500 mg 150 mL/hr over 120 Minutes Intravenous  Once 01/05/20 1904 01/06/20 0127   01/05/20 1830  cefTRIAXone (ROCEPHIN) 2 g in sodium chloride 0.9 % 100 mL IVPB  2 g 200 mL/hr over 30 Minutes Intravenous  Once 01/05/20 1829 01/05/20 2220       Time spent: 15    Anita Davis ANP  [facility] Pager [phone]. If 7PM-7AM, please contact night-coverage at [email or web address] 03/01/2020, 9:16 AM  LOS: 55 days

## 2020-03-01 NOTE — Progress Notes (Signed)
Occupational Therapy Treatment [name] MRN: [id number] DOB: [birth year] Today's Date: 03/01/2020    History of present illness 34 year old with past medical history significant for hepatitis C, polysubstance abuse brought to the hospital 9/13 for disorientation which was suspected to be substance abuse.  After she became more lucid, she was discharged, but then police brought her back later in the day and they found her passed out in a parking lot.  Urine drug screen was positive for benzodiazepine and THC. An EEG done on 9/15 showed patient was in status epilepticus.  Patient was a started on Keppra.  Despite these, EEG noted continued seizures requiring additional medications as well.  Finally on 918, seizures broke. Follow-up EEG on 9/20 noted evidence of [name] from the left central temporal region.  Repeat EEG done on 9/22 noted epileptogenic city from the left central temporal region.  Pt with PEG placed on 02/05/20.    OT comments  Pt without verbalizations but mouthing something on arrival that looked like "help me" without sound. Pt stops when cued more by therapist. Pt progressed OOB to chair for self feeding and socialization at the RN station. Pt engaged in eating once at RN station. Recommendation for SNF total care at this time.    Follow Up Recommendations  SNF    Equipment Recommendations  3 in 1 bedside commode;Wheelchair (measurements OT);Wheelchair cushion (measurements OT);Hospital bed    Recommendations for Other Services Speech consult    Precautions / Restrictions Precautions Precautions: Fall Precaution Comments: Seizure, PEG abdominal binder       Mobility Bed Mobility Overal bed mobility: Needs Assistance Bed Mobility: Supine to Sit;Sit to Supine Rolling: Max assist   Supine to sit: Mod assist     General bed mobility comments: pt total (A) to bring bil LE toward EOB. pt does not intiate but once sitting was able to  sustain min guard (A)  Transfers Overall transfer level: Needs assistance   Transfers: Sit to/from Stand;Stand Pivot Transfers Sit to Stand: Max assist Stand pivot transfers: Max assist       General transfer comment: stand pivot to the R without initiation or stepping    Balance                                           ADL either performed or assessed with clinical judgement   ADL Overall ADL's : Needs assistance/impaired Eating/Feeding: Set up;Sitting     Grooming Details (indicate cue type and reason): noted to have sore on mouth and requesting vaseline for her mouth                 Toilet Transfer: Maximal assistance;Stand-pivot Toilet Transfer Details (indicate cue type and reason): does not engage in transfer as much this session           General ADL Comments: pt presented with RW like other sessions and actually startes to resist the transfer . when RW removed engaged in anterior tilt to progress to chair     Vision       Perception     Praxis      Cognition Arousal/Alertness: Awake/alert Behavior During Therapy: Flat affect Overall Cognitive Status: Impaired/Different from baseline  General Comments: pt does not engage in any verbal feedback. pt mouthing what looks like "help me" but no sound at all. pt does not follow any commands. pt provided food at RN station and automatic with self feeding and even placing it down on arm rest of the chair to void spilling it        Exercises     Shoulder Instructions       General Comments wound on L side of mouth new    Pertinent Vitals/ Pain       Pain Assessment: Faces Faces Pain Scale: Hurts a little bit Pain Location: generalized Pain Descriptors / Indicators: Grimacing Pain Intervention(s): Repositioned  Home Living                                          Prior Functioning/Environment               Frequency  Min 2X/week        Progress Toward Goals  OT Goals(current goals can now be found in the care plan section)  Progress towards OT goals: Progressing toward goals  Acute Rehab OT Goals Patient Stated Goal: unable to state OT Goal Formulation: Patient unable to participate in goal setting Time For Goal Achievement: 03/09/20 Potential to Achieve Goals: Good ADL Goals Pt Will Perform Eating: with min guard assist;sitting (not consistent enough to state met yet) Pt Will Perform Grooming: with min guard assist;sitting Additional ADL Goal #1: pt will demonstrate 2 greeting gestures to staff appropriately mod I   Plan Discharge plan remains appropriate    Co-evaluation                 AM-PAC OT "6 Clicks" Daily Activity     Outcome Measure   Help from another person eating meals?: A Lot Help from another person taking care of personal grooming?: A Lot Help from another person toileting, which includes using toliet, bedpan, or urinal?: A Lot Help from another person bathing (including washing, rinsing, drying)?: A Lot Help from another person to put on and taking off regular upper body clothing?: A Lot Help from another person to put on and taking off regular lower body clothing?: A Lot 6 Click Score: 12    End of Session Equipment Utilized During Treatment: Gait belt;Other (comment) (abdominal binder (NEW) applied)  OT Visit Diagnosis: Unsteadiness on feet (R26.81);Muscle weakness (generalized) (M62.81);Pain   Activity Tolerance Patient tolerated treatment well   Patient Left in chair;with chair alarm set;Other (comment) (At RN station with [facility])   Nurse Communication Mobility status;Precautions        Time: [phone] OT Time Calculation (min): 26 min  Charges: OT General Charges $OT Visit: 1 Visit OT Treatments $Self Care/Home Management : 8-22 mins   Brynn, OTR/L  Acute Rehabilitation Services Pager: [phone] Office:  [phone] .    Jeri Modena 03/01/2020, 11:47 AM

## 2020-03-02 NOTE — Progress Notes (Addendum)
Nutrition Follow-up  DOCUMENTATION CODES:   Not applicable  INTERVENTION:  -Continue Ensure Enlive po BID, each supplement provides 350 kcal and 20 grams of protein -Continue to Assist pt with all meals and encourage PO intake -Continue 93ml Prosource TF TID via PEG -If pt consumes <50% of her meal, provide her with 1.5 cartons ( ) of Osmolite 1.5 cal via PEG and flush with 30ml free water before and after each flush   Each 1.5 carton ( ) bolus will provide 532 kcals, 22 grams protein, and free water    NUTRITION DIAGNOSIS:   Inadequate oral intake related to lethargy/confusion as evidenced by meal completion < 25%.  progressing  GOAL:   Patient will meet greater than or equal to 90% of their needs  progressing  MONITOR:   TF tolerance, Labs, Weight trends, I & O's  REASON FOR ASSESSMENT:   Consult Enteral/tube feeding initiation and management  ASSESSMENT:   Pt with refractory seizures with acute metabolic encephalopathy. PMH includes hepatitis and polysubstance abuse.  9/24 Cortrak placed (gastric) 10/10 Cortrak dislodged, TF held 10/11 Cortrak advanced (tip in proximal duodenum per KUB), TF restarted 10/14 TF reduced to 62ml/hr due to colonic ileus 10/15 G-tube placement 10/16 TF resumed 10/18 TF rate returned to 67ml/hr; G-tube pulled out 1 inch so TF held 10/21 G-tube replaced 10/27 diet advanced to regular  Pt remains nonverbal. Pt's appetite appears to be quite varied since last RD assessment. Pt noted to have eaten 5-75% x last 6 recorded meals (35% average meal intake). Given pt's intake has been so varied, pt has PRN orders to provide her with 1.5 cartons ( ) of Osmolite 1.5 cal via PEG and flush with 8ml free water before and after each flush. Each 1.5 carton ( ) bolus will provide 532 kcals, 22 grams protein, and free water. Pt also with orders for Ensure Enlive po BID and 59ml Prosource TF TID. Discussed pt with RN who  confirms pt's intake has been varied depending on her mentation, but reports pt often does well with Ensure Enlive. Will continue with current nutrition plan of care until pt's PO intake is more consistent. Reviewed PRN orders with RN and plan to provide boluses when pt doesn't eat 50% or more. RN expressed understanding.   Admit wt: 74.8 kg Current wt: 74.1 kg  No UOP documented.   Labs reviewed.  Medications: Pepcid, free water Q6H, Miralax, Klor-con, Vitamin B6, Senokot, thiamine  Diet Order:   Diet Order            Diet regular Room service appropriate? Yes; Fluid consistency: Thin  Diet effective now                 EDUCATION NEEDS:   No education needs have been identified at this time  Skin:  Skin Assessment: Reviewed RN Assessment Skin Integrity Issues:: Incisions Incisions: abdomen  Last BM:  03/01/20 type 4  Height:   Ht Readings from Last 1 Encounters:  01/06/20 5\' 5"  (1.651 m)    Weight:   Wt Readings from Last 1 Encounters:  02/27/20 74.1 kg   BMI:  Body mass index is 27.19 kg/m.  Estimated Nutritional Needs:   Kcal:  1800-2000  Protein:  90-100 grams  Fluid:  >/=1.8L/d    13/06/21, MS, RD, LDN RD pager number and weekend/on-call pager number located in [place].

## 2020-03-02 NOTE — Plan of Care (Signed)
Pt is unable to meet goals at this time due to altered mental status.  Problem: Education: Goal: Knowledge of General Education information will improve Description: Including pain rating scale, medication(s)/side effects and non-pharmacologic comfort measures Outcome: Not Progressing   Problem: Health Behavior/Discharge Planning: Goal: Ability to manage health-related needs will improve Outcome: Not Progressing   Problem: Clinical Measurements: Goal: Ability to maintain clinical measurements within normal limits will improve Outcome: Not Progressing Goal: Will remain free from infection Outcome: Not Progressing Goal: Diagnostic test results will improve Outcome: Not Progressing Goal: Respiratory complications will improve Outcome: Not Progressing Goal: Cardiovascular complication will be avoided Outcome: Not Progressing   Problem: Activity: Goal: Risk for activity intolerance will decrease Outcome: Not Progressing   Problem: Nutrition: Goal: Adequate nutrition will be maintained Outcome: Not Progressing   Problem: Coping: Goal: Level of anxiety will decrease Outcome: Not Progressing   Problem: Elimination: Goal: Will not experience complications related to bowel motility Outcome: Not Progressing Goal: Will not experience complications related to urinary retention Outcome: Not Progressing   Problem: Pain Managment: Goal: General experience of comfort will improve Outcome: Not Progressing   Problem: Safety: Goal: Ability to remain free from injury will improve Outcome: Not Progressing   Problem: Skin Integrity: Goal: Risk for impaired skin integrity will decrease Outcome: Not Progressing   Problem: Education: Goal: Expressions of having a comfortable level of knowledge regarding the disease process will increase Outcome: Not Progressing   Problem: Coping: Goal: Ability to adjust to condition or change in health will improve Outcome: Not Progressing Goal:  Ability to identify appropriate support needs will improve Outcome: Not Progressing   Problem: Health Behavior/Discharge Planning: Goal: Compliance with prescribed medication regimen will improve Outcome: Not Progressing   Problem: Medication: Goal: Risk for medication side effects will decrease Outcome: Not Progressing   Problem: Clinical Measurements: Goal: Complications related to the disease process, condition or treatment will be avoided or minimized Outcome: Not Progressing Goal: Diagnostic test results will improve Outcome: Not Progressing   Problem: Safety: Goal: Verbalization of understanding the information provided will improve Outcome: Not Progressing   Problem: Self-Concept: Goal: Level of anxiety will decrease Outcome: Not Progressing Goal: Ability to verbalize feelings about condition will improve Outcome: Not Progressing

## 2020-03-02 NOTE — Progress Notes (Signed)
Occupational Therapy Treatment [name] MRN: [id number] DOB: Jan 23, 1986 Today's Date: 03/02/2020    History of present illness 34 year old with past medical history significant for hepatitis C, polysubstance abuse brought to the hospital 9/13 for disorientation which was suspected to be substance abuse.  After she became more lucid, she was discharged, but then police brought her back later in the day and they found her passed out in a parking lot.  Urine drug screen was positive for benzodiazepine and THC. An EEG done on 9/15 showed patient was in status epilepticus.  Patient was a started on Keppra.  Despite these, EEG noted continued seizures requiring additional medications as well.  Finally on 918, seizures broke. Follow-up EEG on 9/20 noted evidence of [name] from the left central temporal region.  Repeat EEG done on 9/22 noted epileptogenic city from the left central temporal region.  Pt with PEG placed on 02/05/20.    OT comments  Pt liable in 11/09 session and found in room liable again today. OT helping patient oob sitting up at RN station. Pt stopped crying drinking ensure. Pt noted to have have a continuous wiping of mouth with increased secretions the last two days as well. Pt with wound on L side of mouth now with continuous wiping. Barrier cream applied to area to protect the skin.   Follow Up Recommendations  SNF    Equipment Recommendations  3 in 1 bedside commode;Wheelchair (measurements OT);Wheelchair cushion (measurements OT);Hospital bed    Recommendations for Other Services      Precautions / Restrictions Precautions Precautions: Fall Precaution Comments: Seizure, PEG abdominal binder       Mobility Bed Mobility Overal bed mobility: Needs Assistance Bed Mobility: Rolling;Supine to Sit Rolling: Mod assist   Supine to sit: Mod assist     General bed mobility comments: does not initiate but once eob sitting min guard (A)    Transfers Overall transfer level: Needs assistance Equipment used: Rolling walker (2 wheeled) Transfers: Sit to/from Stand Sit to Stand: Mod assist         General transfer comment: does not initiate but once standing able to sustain and starts to initiate steps    Balance                                           ADL either performed or assessed with clinical judgement   ADL Overall ADL's : Needs assistance/impaired Eating/Feeding: Set up;Sitting   Grooming: Wash/dry face;Supervision/safety                   Toilet Transfer: Moderate assistance;RW;BSC Toilet Transfer Details (indicate cue type and reason): no voiding         Functional mobility during ADLs: Minimal assistance;Rolling walker General ADL Comments: pt found crying in the bed. Tech reports poor po intake. pt transfered to [facility] then to chair. pt positioned at RN station with ensure in chocolate icecream like a milk shake. pt drinking it steadily      Vision       Perception     Praxis      Cognition Arousal/Alertness: Awake/alert Behavior During Therapy: Flat affect Overall Cognitive Status: Difficult to assess  General Comments: no verbalization or eye contact        Exercises     Shoulder Instructions       General Comments      Pertinent Vitals/ Pain       Pain Assessment: Faces Faces Pain Scale:  (crying but does not appear to be pain related)  Home Living                                          Prior Functioning/Environment              Frequency  Min 2X/week        Progress Toward Goals  OT Goals(current goals can now be found in the care plan section)  Progress towards OT goals: Progressing toward goals  Acute Rehab OT Goals Patient Stated Goal: unable to state OT Goal Formulation: Patient unable to participate in goal setting Time For Goal Achievement:  03/09/20 Potential to Achieve Goals: Good ADL Goals Pt Will Perform Eating: with min guard assist;sitting Pt Will Perform Grooming: with min guard assist;sitting Additional ADL Goal #1: pt will demonstrate 2 greeting gestures to staff appropriately mod I   Plan Discharge plan remains appropriate    Co-evaluation                 AM-PAC OT "6 Clicks" Daily Activity     Outcome Measure   Help from another person eating meals?: A Lot Help from another person taking care of personal grooming?: A Lot Help from another person toileting, which includes using toliet, bedpan, or urinal?: A Lot Help from another person bathing (including washing, rinsing, drying)?: A Lot Help from another person to put on and taking off regular upper body clothing?: A Lot Help from another person to put on and taking off regular lower body clothing?: A Lot 6 Click Score: 12    End of Session Equipment Utilized During Treatment: Rolling walker  OT Visit Diagnosis: Unsteadiness on feet (R26.81);Muscle weakness (generalized) (M62.81);Pain   Activity Tolerance Patient tolerated treatment well   Patient Left in chair;with chair alarm set;Other (comment)   Nurse Communication Mobility status;Precautions        Time: [id number] OT Time Calculation (min): 16 min  Charges: OT General Charges $OT Visit: 1 Visit OT Treatments $Self Care/Home Management : 8-22 mins   Anita Davis, OTR/L  Acute Rehabilitation Services Pager: [phone] Office: [phone] .    Anita Davis 03/02/2020, 4:01 PM

## 2020-03-02 NOTE — Progress Notes (Addendum)
[facility] PROGRESS NOTE  Doneen Poissonmanda Susi ZOX:[id number] DOB: October 14, 1985 DOA: 01/04/2020 PCP: Jacquelin HawkingMcElroy, Shannon, PA-C     10/20 nonverbal and not making eye contact or following simple commands     10/25: [name] much more alert today.  Eventually she was able to talk and express her needs.  Subsequently she was rolled out to the nurses station to continue to improve her socialization.  She was requesting a Covid vaccine.   Status: Inpatient-Remains inpatient appropriate because:Altered mental status and Unsafe d/c plan    Dispo: The patient is from: Home              Anticipated d/c is to: SNF              Anticipated d/c date is: > 3 days              Patient currently is medically stable to d/c.  Due to UNSAFE DC PLAN-patient needs placement in skilled nursing facility based on altered mentation and inability to manage self-care.  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 Do not expect recovery to previous baseline  Due to inconsistent alertness and thus inadequate oral intake of foods patient will continue G-tube for feedings and medications   Code Status: Full Family Communication: Updated Lanora Manislizabeth "[id number]" [name] by voicemail on 10/25; 11/2 patient's other aunt at bedside today and updated on condition DVT prophylaxis: Lovenox Vaccination status: [name] confirmed pt given [employer]st Pfizer COVID vaccine in June. 2nd vaccine given 11/2  HPI: 34 y.o. F with hx substance use disorder, active, hepatitis C, and gestational diabetes who presented with confusion.  Patient initially admitted in September with encephalopathy, thought to be from drug overdose.  LP showed no signs of infection. MRI brain unremarkable. She remained encephalopathic, and EEG showed focal seizures.  The seizures were suppressed, and [facility], but after seizure control, she  had poor neurologic recovery.  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  She deteriorated after steroids were stopped, so IVIG was started.  Subjective: Alert.  No eye contact but does track examiner in room.  Very restless sitting in bed.  Remains nonverbal.  Objective: Vitals:   03/02/20 0759 03/02/20 1214  BP: (!) 145/99 117/85  Pulse: (!) 106 95  Resp: 20 18  Temp: 98.9 F (37.2 C) 98.2 F (36.8 C)  SpO2: 96% 98%   No intake or output data in the 24 hours ending 03/02/20 1332 Filed Weights   02/23/20 0500 02/24/20 0500 02/27/20 0500  Weight: 72.9 kg 71.4 kg 74.1 kg    Exam: Constitutional: Awake, restless, nonverbal Respiratory: clear to auscultation bilaterally, Normal respiratory effort.-Room air Cardiovascular: Regular rate and rhythm. No extremity edema.    Abdomen: no tenderness palpation,G-tube in place with tube feeding infusing.  Musculoskeletal: No contractures.  No tremors or spasticity  Neurologic: CN 2-12 grossly intact, currently not following simple commands but does have spontaneous movement of extremities Psychiatric: Awake, nonverbal, does make eye contact when spoken to but does not attempt to follow commands.   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 Initially treated as autoimmune encephalitis but despite appropriate treatment including IVIG no significant improvement in symptoms. -At this juncture it  is suspected etiology related to sequelae from seizure activity that began prior to admission and persisted in the form of status epilepticus after admission.  Has had follow-up EEG in the past 30 days without any reemergence of seizure  activities. -Continues with cyclic episodes of wakefulness/speaking then minimal responsiveness/non-verbal -Continue phenobarbital, Vimpat, and Dilantin -Continue empiric thiamine and vitamin B6 replacement -Seroquel initiated due to paranoid behaviors (primarily verbal expression) and witnessed behaviors concerning for possible hallucinations but due to daytime sleepiness and return to nonverbal state this medication was discontinued  Physical deconditioning -Working with PT and OT-motivated by food (use of chocolate) -Continues to require extensive assist for all ADLs with recommendation for SNF -PT notes significant increase in ambulation distance as of 10/27.   -PT and OT are working with patient. She remains nonverbal and with max assist has been able to walk in halls. And is to place patient out of bed to chair and nursing station daily to improve socialization  Suspected chronic depression in context of ongoing polysubstance abuse prior to admission -History obtained from family that patient has been demonstrating symptoms of depression for several months that had worsened just prior to presentation -Waxing and waning mental status 2/2 neurological insult and severe depression  -11/8 was evaluated by psych who increased Zoloft dose to 100 mg daily  Dislodged gastrostomy tube -Placed by IR on 10/21  Dysphagia (resolved) with persistent inadequate oral intake - SLP recommended regular diet with thin liquids but with waxing and waning alertness I have continued intermittent tube feedings given inconsistent intake of solid diet -Continue nocturnal tube feedings-if patient remains awake during the day can transition to bolus tube feedings based on amount of food eaten versus complete discontinuation of tube feedings if patient consumes at least 50% of meals consistently  Nonobstructive transaminitis/hepatitis C -Elevated HCV antibody with markedly elevated HCV RNA quantitative level   consistent with chronic hepatitis C  -10/25 AST and ALT greater than 100 but total bilirubin normal -Patient has remained in the hospital sometime therefore will ask ID to see patient in consultation to make recommendations regarding if she is a candidate for treatment or if continuing watchful waiting is more appropriate -Repeat labs again in a.m. on 11/11 -Discussed with ID Dr. Manson Passey.  Treatment of hepatitis C typically is initiated in the outpatient setting.  Medications can be quite expensive and usually take time to obtain insurance approval.  Also since patient likely go to skilled nursing facility expensive medication could be barrier to admission. -ID recommends scheduling outpatient appointment their clinic closer to discharge date  Nutrition Status/moderate to severe protein calorie malnutrition: Nutrition Problem: Inadequate oral intake Etiology: lethargy/confusion Signs/Symptoms: meal completion < 25% Interventions: Tube feeding, Prostat Estimated body mass index is 27.19 kg/m as calculated from the following:   Height as of this encounter: 5\' 5"  (1.651 m).   Weight as of this encounter: 74.1 kg.     Data Reviewed: Basic Metabolic Panel: No results for input(s): NA, K, CL, CO2, GLUCOSE, BUN, CREATININE, CALCIUM, MG, PHOS in the last 168 hours. Liver Function Tests: No results for input(s): AST, ALT, ALKPHOS, BILITOT, PROT, ALBUMIN in the last 168 hours. No results for input(s): LIPASE, AMYLASE in the last 168 hours. No results for input(s): AMMONIA in the last 168 hours. CBC: No results for input(s): WBC, NEUTROABS, HGB, HCT, MCV, PLT in the last 168 hours. Cardiac Enzymes: No results for input(s): CKTOTAL, CKMB, CKMBINDEX, TROPONINI in the last 168 hours. BNP (last 3 results) No  results for input(s): BNP in the last 8760 hours.  ProBNP (last 3 results) No results for input(s): PROBNP in the last 8760 hours.  CBG: Recent Labs  Lab 02/24/20 1948  GLUCAP 211*     No results found for this or any previous visit (from the past 240 hour(s)).   Studies: No results found.  Scheduled Meds: . amantadine  100 mg Per Tube BID  . Chlorhexidine Gluconate Cloth  6 each Topical Daily  . famotidine  20 mg Per Tube BID  . feeding supplement  237 mL Oral BID BM  . feeding supplement (PROSource TF)  45 mL Per Tube TID  . free water  200 mL Per Tube Q6H  . lacosamide  200 mg Per Tube BID  . PHENObarbital  60 mg Per Tube BID  . phenytoin  200 mg Per Tube BID  . polyethylene glycol  17 g Per Tube Daily  . potassium chloride  40 mEq Per Tube Daily  . vitamin B-6  100 mg Per Tube Daily  . senna  1 tablet Per Tube QHS  . sertraline  100 mg Per Tube Daily  . thiamine  100 mg Per Tube Daily   Continuous Infusions:   Principal Problem:   Refractory seizure (HCC) Active Problems:   Acute metabolic encephalopathy   Hypokalemia   Polysubstance abuse (HCC)   Elevated CK   Transaminitis   Chronic hepatitis C without hepatic coma (HCC)   Distended abdomen   Palliative care encounter   Colonic Ileus (HCC)   Organic brain syndrome   On enteral nutrition   Physical deconditioning   Protein-calorie malnutrition (HCC)   Inadequate oral nutritional intake   Consultants:  Neurology  Psychiatry  Interventional radiology  Surgery  Procedures:  9/14 lumbar puncture  9/15 EEG  9/16 EEG  9/17 EEG  9/19 overnight EEG with video  9/22 overnight EEG with video with discontinuation of long-term EEG monitoring on 9/25  9/24 core track placement  10/6 EEG  Antibiotics: Anti-infectives (From admission, onward)   Start     Dose/Rate Route Frequency Ordered Stop   02/11/20 1526  ceFAZolin (ANCEF) IVPB 2g/100 mL premix        2 g 200 mL/hr over 30 Minutes Intravenous  Once 02/11/20 1526 02/11/20 1641   02/05/20 0600  ceFAZolin (ANCEF) IVPB 2g/100 mL premix        2 g 200 mL/hr over 30 Minutes Intravenous To Short Stay 02/04/20 1054 02/05/20  0950   01/29/20 0600  ceFAZolin (ANCEF) IVPB 2g/100 mL premix        2 g 200 mL/hr over 30 Minutes Intravenous On call to O.R. 01/28/20 1511 01/30/20 0559   01/28/20 2000  cefTRIAXone (ROCEPHIN) 1 g in sodium chloride 0.9 % 100 mL IVPB        1 g 200 mL/hr over 30 Minutes Intravenous Every 24 hours 01/28/20 1925 02/02/20 0724   01/06/20 0900  cefTRIAXone (ROCEPHIN) 2 g in sodium chloride 0.9 % 100 mL IVPB  Status:  Discontinued        2 g 200 mL/hr over 30 Minutes Intravenous Every 12 hours 01/06/20 0105 01/06/20 2202   01/06/20 0800  vancomycin (VANCOCIN) IVPB 1000 mg/200 mL premix  Status:  Discontinued       "Followed by" Linked Group Details   1,000 mg 200 mL/hr over 60 Minutes Intravenous Every 12 hours 01/05/20 1904 01/06/20 2202   01/05/20 2000  vancomycin (VANCOREADY) IVPB 1500 mg/300 mL       "  Followed by" Linked Group Details   1,500 mg 150 mL/hr over 120 Minutes Intravenous  Once 01/05/20 1904 01/06/20 0127   01/05/20 1830  cefTRIAXone (ROCEPHIN) 2 g in sodium chloride 0.9 % 100 mL IVPB        2 g 200 mL/hr over 30 Minutes Intravenous  Once 01/05/20 1829 01/05/20 2220       Time spent: 20 minutes    [name] ANP  [facility] Pager [phone]. If 7PM-7AM, please contact night-coverage at [email or web address] 03/02/2020, 1:32 PM  LOS: 56 days

## 2020-03-03 LAB — COMPREHENSIVE METABOLIC PANEL
ALT: 274 U/L — ABNORMAL HIGH (ref 0–44)
AST: 165 U/L — ABNORMAL HIGH (ref 15–41)
Albumin: 3.5 g/dL (ref 3.5–5.0)
Alkaline Phosphatase: 158 U/L — ABNORMAL HIGH (ref 38–126)
Anion gap: 11 (ref 5–15)
BUN: 16 mg/dL (ref 6–20)
CO2: 27 mmol/L (ref 22–32)
Calcium: 9.1 mg/dL (ref 8.9–10.3)
Chloride: 101 mmol/L (ref 98–111)
Creatinine, Ser: 0.53 mg/dL (ref 0.44–1.00)
GFR, Estimated: >60 mL/min (ref >60)
Glucose, Bld: 113 mg/dL — ABNORMAL HIGH (ref 70–99)
Potassium: 4.1 mmol/L (ref 3.5–5.1)
Sodium: 139 mmol/L (ref 135–145)
Total Bilirubin: 0.6 mg/dL (ref 0.3–1.2)
Total Protein: 6.8 g/dL (ref 6.5–8.1)

## 2020-03-03 LAB — CBC
HCT: 43.4 % (ref 36.0–46.0)
Hemoglobin: 14.6 g/dL (ref 12.0–15.0)
MCH: 31.8 pg (ref 26.0–34.0)
MCHC: 33.6 g/dL (ref 30.0–36.0)
MCV: 94.6 fL (ref 80.0–100.0)
Platelets: 345 10*3/uL (ref 150–400)
RBC: 4.59 MIL/uL (ref 3.87–5.11)
RDW: 14 % (ref 11.5–15.5)
WBC: 4.8 10*3/uL (ref 4.0–10.5)
nRBC: 0 % (ref 0.0–0.2)

## 2020-03-03 MED ORDER — NYSTATIN 100000 UNIT/ML MT SUSP
5.0000 mL | Freq: Four times a day (QID) | OROMUCOSAL | Status: DC
  Administered 2020-03-03 – 2020-03-07 (×16): 500000 [IU] via ORAL
  Filled 2020-03-03 (×19): qty 5

## 2020-03-03 NOTE — TOC Progression Note (Signed)
Transition of Care [facility]) - Progression Note    Patient Details  Name: Anita Davis MRN: [id number] Date of Birth: 01-09-1986  Transition of Care [facility]) CM/SW Contact  Baldemar Lenis, [place] Phone Number: 03/03/2020, 9:45 AM  Clinical Narrative:   CSW confirmed with financial counseling that patient's Medicaid and Disability applications have been sent in and are pending. Patient continues to have no bed offers for SNF at this time.    Expected Discharge Plan: Skilled Nursing Facility Barriers to Discharge: Continued Medical Work up, Inadequate or no insurance, Active Substance Use - Placement, SNF Pending payor source - LOG, SNF Pending Medicaid, SNF Pending bed offer  Expected Discharge Plan and Services Expected Discharge Plan: Skilled Nursing Facility In-house Referral: Financial Counselor   Post Acute Care Choice: Skilled Nursing Facility Living arrangements for the past 2 months: Mobile Home                                       Social Determinants of Health (SDOH) Interventions    Readmission Risk Interventions No flowsheet data found.

## 2020-03-03 NOTE — TOC Progression Note (Addendum)
Transition of Care [facility]) - Progression Note    Patient Details  Name: Anita Davis MRN: [id number] Date of Birth: 01-11-1986  [facility]) CM/SW Contact  Kermit Balo, RN Phone Number: 03/03/2020, 9:13 AM  Clinical Narrative:    CM sent Financial counseling a message  about disability/ medicaid. They are to send in her application as the MD agreed this will be a long term process. Pt faxed out for SNF rehab.   Expected Discharge Plan: Skilled Nursing Facility Barriers to Discharge: Continued Medical Work up, Inadequate or no insurance, Active Substance Use - Placement, SNF Pending payor source - LOG, SNF Pending Medicaid, SNF Pending bed offer  Expected Discharge Plan and Services Expected Discharge Plan: Skilled Nursing Facility In-house Referral: Financial Counselor   Post Acute Care Choice: Skilled Nursing Facility Living arrangements for the past 2 months: Mobile Home                                       Social Determinants of Health (SDOH) Interventions    Readmission Risk Interventions No flowsheet data found.

## 2020-03-03 NOTE — Progress Notes (Signed)
[facility] PROGRESS NOTE  Anita Davis RCB:[id number] DOB: October 13, 1985 DOA: 01/04/2020 PCP: [name], PA-C     10/20 nonverbal and not making eye contact or following simple commands     10/25: Anita Davis much more alert today.  Eventually she was able to talk and express her needs.  Subsequently she was rolled out to the nurses station to continue to improve her socialization.  She was requesting a Covid vaccine.   Status: Inpatient-Remains inpatient appropriate because:Altered mental status and Unsafe d/c plan Medicaid and disability applications have been completed and are pending.  Unfortunately patient continues to have no bed offers for SNF at this juncture.   Dispo: The patient is from: Home              Anticipated d/c is to: SNF              Anticipated d/c date is: > 3 days              Patient currently is medically stable to d/c.  Due to UNSAFE DC PLAN-patient needs placement in skilled nursing facility based on altered mentation and inability to manage self-care.  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 Do not expect recovery to previous baseline  Due to inconsistent alertness and thus inadequate oral intake of foods patient will continue G-tube for feedings and medications   Code Status: Full Family Communication: Updated [name] "[id number] [name] by voicemail on 10/25; 11/2 patient's other aunt at bedside today and updated on condition DVT prophylaxis: Lovenox Vaccination status: [name] confirmed pt given [employer] COVID vaccine in June. 2nd vaccine given 11/2  HPI: 34 y.o. F with hx substance use disorder, active, hepatitis C, and gestational diabetes who presented with confusion.  Patient initially admitted in September with encephalopathy, thought to be from drug overdose.  LP showed no signs of infection. MRI brain unremarkable. She remained  encephalopathic, and EEG showed focal seizures.  The seizures were suppressed, and she was transferred to [facility] for continuous EEG, but after seizure control, she had poor neurologic recovery.  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  She deteriorated after steroids were stopped, so IVIG was started.  Subjective: Awake.  Inconsistent tracking with eyes.  No attempt to verbalize or follow simple commands..  Objective: Vitals:   03/03/20 0818 03/03/20 1127  BP:  (!) 176/111  Pulse: 93 (!) 107  Resp: 18 18  Temp: 98.1 F (36.7 C) 98.3 F (36.8 C)  SpO2: 98% 94%    Intake/Output Summary (Last 24 hours) at 03/03/2020 1214 Last data filed at 03/02/2020 1500 Gross per 24 hour  Intake 357 ml  Output --  Net 357 ml   Filed Weights   02/23/20 0500 02/24/20 0500 02/27/20 0500  Weight: 72.9 kg 71.4 kg 74.1 kg    Exam: Constitutional: Awake, nonverbal, not restless today ENT: Patient with areas of recent trauma to lips apparently from "picking" at face previously.  Had difficulty examining oral cavity but tongue appears to have white debris consistent with oral Candida Respiratory: clear to auscultation bilaterally, Normal respiratory effort.-Room air Cardiovascular: Regular rate and rhythm. No extremity edema.    Abdomen: no tenderness palpation,G-tube in place with tube feeding infusing.  Musculoskeletal: No contractures.  No tremors or spasticity  Neurologic: CN 2-12 grossly intact, currently not following simple commands but does have spontaneous movement  of extremities Psychiatric: Awake, nonverbal, does make eye contact when spoken to but does not attempt to follow commands.   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 Initially treated as autoimmune encephalitis but despite appropriate treatment including IVIG no significant improvement in symptoms. -At this juncture it is suspected etiology related to sequelae from seizure activity that began prior to admission and persisted in the form of status epilepticus after admission.  Has had follow-up EEG in the past 30 days without any reemergence of seizure activities. -Continues with cyclic episodes of wakefulness/speaking then minimal responsiveness/non-verbal -Continue phenobarbital, Vimpat, and Dilantin -Continue empiric thiamine and vitamin B6 replacement -Seroquel initiated due to paranoid behaviors (primarily verbal expression) and witnessed behaviors concerning for possible hallucinations but due to daytime sleepiness and return to nonverbal state this medication was discontinued  Suspected oral Candida -Begin nystatin swish and swallow 4 times daily  Physical deconditioning -Working with PT and OT-motivated by food (use of chocolate) -Continues to require extensive assist for all ADLs with recommendation for SNF -PT notes significant increase in ambulation distance as of 10/27.   -PT and OT are working with patient. She remains nonverbal and with max assist has been able to walk in halls. And is to place patient out of bed to chair and nursing station daily to improve socialization  Suspected chronic depression in context of ongoing polysubstance abuse prior to admission -History obtained from family that patient has been demonstrating symptoms of depression for several months that had worsened just prior to presentation -Waxing and waning mental status 2/2 neurological insult and severe depression  -11/8 was evaluated by psych who increased Zoloft dose to 100 mg daily  Dislodged gastrostomy tube -Placed by IR on 10/21  Dysphagia (resolved) with persistent inadequate oral intake - SLP recommended regular  diet with thin liquids but with waxing and waning alertness I have continued intermittent tube feedings given inconsistent intake of solid diet -Continue nocturnal tube feedings-if patient remains awake during the day can transition to bolus tube feedings based on amount of food eaten versus complete discontinuation of tube feedings if patient consumes at least 50% of meals consistently  Nonobstructive transaminitis/hepatitis C -Elevated HCV antibody with markedly elevated HCV RNA quantitative level  consistent with chronic hepatitis C  -10/25 AST and ALT greater than 100 but total bilirubin normal -Patient has remained in the hospital sometime therefore will ask ID to see patient in consultation to make recommendations regarding if she is a candidate for treatment or if continuing watchful waiting is more appropriate -Repeat labs again in a.m. on 11/11 -Discussed with ID Dr. Manson PasseyMandahar.  Treatment of hepatitis C typically is initiated in the outpatient setting.  Medications can be quite expensive and usually take time to obtain insurance approval.  Also since patient likely go to skilled nursing facility expensive medication could be barrier to admission. -ID recommends scheduling outpatient appointment their clinic closer to discharge date  Nutrition Status/moderate to severe protein calorie malnutrition: Nutrition Problem: Inadequate oral intake Etiology: lethargy/confusion Signs/Symptoms: meal completion < 25% Interventions: Tube feeding, Prostat Estimated body mass index is 27.19 kg/m as calculated from the following:   Height as of this encounter: 5\' 5"  (1.651 m).   Weight as of this encounter: 74.1 kg.     Data Reviewed: Basic Metabolic Panel: Recent Labs  Lab 03/03/20 0045  NA 139  K 4.1  CL 101  CO2 27  GLUCOSE 113*  BUN 16  CREATININE 0.53  CALCIUM 9.1   Liver Function Tests: Recent Labs  Lab 03/03/20 0045  AST 165*  ALT 274*  ALKPHOS 158*  BILITOT 0.6  PROT 6.8   ALBUMIN 3.5   No results for input(s): LIPASE, AMYLASE in the last 168 hours. No results for input(s): AMMONIA in the last 168 hours. CBC: Recent Labs  Lab 03/03/20 0045  WBC 4.8  HGB 14.6  HCT 43.4  MCV 94.6  PLT 345   Cardiac Enzymes: No results for input(s): CKTOTAL, CKMB, CKMBINDEX, TROPONINI in the last 168 hours. BNP (last 3 results) No results for input(s): BNP in the last 8760 hours.  ProBNP (last 3 results) No results for input(s): PROBNP in the last 8760 hours.  CBG: No results for input(s): GLUCAP in the last 168 hours.  No results found for this or any previous visit (from the past 240 hour(s)).   Studies: No results found.  Scheduled Meds: . amantadine  100 mg Per Tube BID  . Chlorhexidine Gluconate Cloth  6 each Topical Daily  . famotidine  20 mg Per Tube BID  . feeding supplement  237 mL Oral BID BM  . feeding supplement (PROSource TF)  45 mL Per Tube TID  . free water  200 mL Per Tube Q6H  . lacosamide  200 mg Per Tube BID  . nystatin  5 mL Oral QID  . PHENObarbital  60 mg Per Tube BID  . phenytoin  200 mg Per Tube BID  . polyethylene glycol  17 g Per Tube Daily  . potassium chloride  40 mEq Per Tube Daily  . vitamin B-6  100 mg Per Tube Daily  . senna  1 tablet Per Tube QHS  . sertraline  100 mg Per Tube Daily  . thiamine  100 mg Per Tube Daily   Continuous Infusions:   Principal Problem:   Refractory seizure (HCC) Active Problems:   Acute metabolic encephalopathy   Hypokalemia   Polysubstance abuse (HCC)   Elevated CK   Transaminitis   Chronic hepatitis C without hepatic coma (HCC)   Distended abdomen   Palliative care encounter   Colonic Ileus (HCC)   Organic brain syndrome   On enteral nutrition   Physical deconditioning   Protein-calorie malnutrition (HCC)   Inadequate oral nutritional intake   Consultants:  Neurology  Psychiatry  Interventional radiology  Surgery  Procedures:  9/14 lumbar puncture  9/15  EEG  9/16 EEG  9/17 EEG  9/19 overnight EEG with video  9/22 overnight EEG with video with discontinuation of long-term EEG monitoring on 9/25  9/24 core track placement  10/6 EEG  Antibiotics: Anti-infectives (From admission, onward)   Start     Dose/Rate Route Frequency Ordered Stop   02/11/20 1526  ceFAZolin (ANCEF) IVPB 2g/100 mL premix        2 g 200 mL/hr over 30 Minutes Intravenous  Once 02/11/20 1526 02/11/20 1641   02/05/20 0600  ceFAZolin (ANCEF) IVPB 2g/100 mL premix        2 g 200 mL/hr over 30 Minutes Intravenous To Short Stay 02/04/20 1054 02/05/20 0950   01/29/20 0600  ceFAZolin (ANCEF) IVPB 2g/100 mL premix        2 g 200 mL/hr over 30 Minutes Intravenous On call to O.R. 01/28/20 1511 01/30/20 0559   01/28/20 2000  cefTRIAXone (ROCEPHIN) 1 g in sodium chloride 0.9 % 100 mL  IVPB        1 g 200 mL/hr over 30 Minutes Intravenous Every 24 hours 01/28/20 1925 02/02/20 0724   01/06/20 0900  cefTRIAXone (ROCEPHIN) 2 g in sodium chloride 0.9 % 100 mL IVPB  Status:  Discontinued        2 g 200 mL/hr over 30 Minutes Intravenous Every 12 hours 01/06/20 0105 01/06/20 2202   01/06/20 0800  vancomycin (VANCOCIN) IVPB 1000 mg/200 mL premix  Status:  Discontinued       "Followed by" Linked Group Details   1,000 mg 200 mL/hr over 60 Minutes Intravenous Every 12 hours 01/05/20 1904 01/06/20 2202   01/05/20 2000  vancomycin (VANCOREADY) IVPB 1500 mg/300 mL       "Followed by" Linked Group Details   1,500 mg 150 mL/hr over 120 Minutes Intravenous  Once 01/05/20 1904 01/06/20 0127   01/05/20 1830  cefTRIAXone (ROCEPHIN) 2 g in sodium chloride 0.9 % 100 mL IVPB        2 g 200 mL/hr over 30 Minutes Intravenous  Once 01/05/20 1829 01/05/20 2220       Time spent: 20 minutes    [name] ANP  [facility] Pager [phone]. If 7PM-7AM, please contact night-coverage at [email or web address] 03/03/2020, 12:14 PM  LOS: 57 days

## 2020-03-04 NOTE — Progress Notes (Addendum)
[facility] PROGRESS NOTE  Anita Davis GBT:[id number] DOB: 29-Jun-1985 DOA: 01/04/2020 PCP: [name], PA-C     10/20 nonverbal and not making eye contact or following simple commands     10/25: Anita Davis much more alert today.  Eventually she was able to talk and express her needs.  Subsequently she was rolled out to the nurses station to continue to improve her socialization.  She was requesting a Covid vaccine.   Status: Inpatient-Remains inpatient appropriate because:Altered mental status and Unsafe d/c plan Medicaid and disability applications have been completed and are pending.  Unfortunately patient continues to have no bed offers for SNF at this juncture.   Dispo: The patient is from: Home              Anticipated d/c is to: SNF              Anticipated d/c date is: > 3 days              Patient currently is medically stable to d/c.  Due to UNSAFE DC PLAN-patient needs placement in skilled nursing facility based on altered mentation and inability to manage self-care.  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 Do not expect recovery to previous baseline  Due to inconsistent alertness and thus inadequate oral intake of foods patient will continue G-tube for feedings and medications   Code Status: Full Family Communication: Updated Anita Davis "[address] [name] by voicemail on 10/25; 11/2 patient's other aunt at bedside today and updated on condition DVT prophylaxis: Lovenox Vaccination status: [name] confirmed pt given [employer] COVID vaccine in June. 2nd vaccine given 11/2  HPI: 34 y.o. F with hx substance use disorder, active, hepatitis C, and gestational diabetes who presented with confusion.  Patient initially admitted in September with encephalopathy, thought to be from drug overdose.  LP showed no signs of infection. MRI brain unremarkable. She remained  encephalopathic, and EEG showed focal seizures.  The seizures were suppressed, and she was transferred to [facility] for continuous EEG, but after seizure control, she had poor neurologic recovery.  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  She deteriorated after steroids were stopped, so IVIG was started.  Since admission patient has had waxing and waning mentation ranging from completely unresponsive to quite alert, confused and verbal and able to express her needs.  Unfortunately these episodes of wakefulness are never sustained and patient will likely be struggling with abnormal mentation for a sustained period of time therefore long-term SNF recommended.  She is unable to eat enough by mouth therefore tube feedings continue per PEG  Subjective: Awake.  No real eye contact today.  Upon arrival to room patient was very restless and squirming in the bed.  Upon examination there was no urinary or fecal incontinence.  Unable to get patient to vocalize or follow simple commands today.  Objective: Vitals:   03/04/20 0408 03/04/20 0858  BP: (!) 116/91 124/84  Pulse: 88   Resp: 18 20  Temp: (!) 97.4 F (36.3 C) 98.6 F (37 C)  SpO2: 100% 100%   No intake or output data in the 24 hours ending 03/04/20 1113 Filed Weights   02/24/20 0500 02/27/20 0500 03/04/20 0500  Weight: 71.4 kg 74.1 kg 77 kg    Exam: Constitutional: Awake, nonverbal, restless and wiggling around in the bed without noticeable incontinence ENT: Patient with  areas of recent trauma to lips apparently from "picking" at face previously. Tongue appears to have white debris consistent with oral Candida Respiratory: CTA bilaterally, Normal respiratory effort.-RA Cardiovascular: Regular rate and rhythm. No extremity edema.  Good capillary refill.    Abdomen: no tenderness palpation,G-tube in place with tube feeding infusing.  Musculoskeletal: No  contractures.  No tremors or spasticity  Neurologic: CN 2-12 grossly intact, currently not following simple commands but does have spontaneous movement of extremities Psychiatric: Awake,no eye contact today when spoken to.  Does not attempt to vocalize or follow any commands.  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  No definitive etiology determined.  Treated as autoimmune encephalitis with steroids and IVIG with no significant improvement -Follow-up EEG in the past 30 days without evidence of reemergence of seizures/status epilepticus -Continues with cyclic episodes of wakefulness versus minimally responsive and nonverbal state  Diarrhea -Freq episodes today-Laxatives stopped -If diarrhea persists will need to r/o infectious etiology  Suspected oral Candida -Continue nystatin swish and swallow 4 times daily  Physical deconditioning -Working with PT and OT-previously had been motivated by food (chocolate) -Continues to require extensive assist for all ADLs with recommendation for SNF -Continue PT OT-he is able to ambulate with assistance but is very impulsive and requires a rolling walker and 100% supervision  Suspected chronic depression in context of ongoing polysubstance abuse prior to admission -History obtained from family that patient has been demonstrating symptoms of depression for several months that had worsened just prior to presentation -Waxing and waning mental status 2/2 neurological insult and severe depression -neurology suspects depression contributing to waxing and waning symptoms. -11/8 psych who increased Zoloft dose to 100 mg daily  Dysphagia (resolved) with persistent inadequate oral intake - SLP recommended regular diet with thin liquids but  with waxing and waning alertness I have continued intermittent tube feedings given inconsistent intake of solid diet -Continue nocturnal tube feedings-Ensure boluses 2 times daily between meals  Nutrition Status/moderate to severe protein calorie malnutrition: Nutrition Problem: Inadequate oral intake Etiology: lethargy/confusion Signs/Symptoms: meal completion < 25% Interventions: Tube feeding, Prostat Estimated body mass index is 28.25 kg/m as calculated from the following:   Height as of this encounter: 5\' 5"  (1.651 m).   Weight as of this encounter: 77 kg.   Other problems: Dislodged gastrostomy tube -Placed by IR on 10/21  Nonobstructive transaminitis/hepatitis C -Elevated HCV antibody with markedly elevated HCV RNA quantitative level  consistent with chronic hepatitis C  -None total bilirubin normal with slightly elevated alkaline phosphatase with AST 165 and ALT 274 -11/10: Discussed with ID Dr. 11-15-2005.  Treatment of hepatitis C typically is initiated in the outpatient setting.  Medications can be quite expensive and usually take time to obtain insurance approval.  Also since patient likely go to skilled nursing facility expensive medication could be barrier to admission. -ID recommends scheduling outpatient appointment their clinic closer to discharge date  Data Reviewed: Basic Metabolic Panel: Recent Labs  Lab 03/03/20 0045  NA 139  K 4.1  CL 101  CO2 27  GLUCOSE 113*  BUN 16  CREATININE 0.53  CALCIUM 9.1   Liver Function Tests: Recent Labs  Lab 03/03/20 0045  AST 165*  ALT 274*  ALKPHOS 158*  BILITOT 0.6  PROT 6.8  ALBUMIN 3.5   No results for input(s): LIPASE, AMYLASE in the last  168 hours. No results for input(s): AMMONIA in the last 168 hours. CBC: Recent Labs  Lab 03/03/20 0045  WBC 4.8  HGB 14.6  HCT 43.4  MCV 94.6  PLT 345   Cardiac Enzymes: No results for input(s): CKTOTAL, CKMB, CKMBINDEX, TROPONINI in the last 168 hours. BNP (last 3  results) No results for input(s): BNP in the last 8760 hours.  ProBNP (last 3 results) No results for input(s): PROBNP in the last 8760 hours.  CBG: No results for input(s): GLUCAP in the last 168 hours.  No results found for this or any previous visit (from the past 240 hour(s)).   Studies: No results found.  Scheduled Meds: . amantadine  100 mg Per Tube BID  . Chlorhexidine Gluconate Cloth  6 each Topical Daily  . famotidine  20 mg Per Tube BID  . feeding supplement  237 mL Oral BID BM  . feeding supplement (PROSource TF)  45 mL Per Tube TID  . free water  200 mL Per Tube Q6H  . lacosamide  200 mg Per Tube BID  . nystatin  5 mL Oral QID  . PHENObarbital  60 mg Per Tube BID  . phenytoin  200 mg Per Tube BID  . polyethylene glycol  17 g Per Tube Daily  . potassium chloride  40 mEq Per Tube Daily  . vitamin B-6  100 mg Per Tube Daily  . senna  1 tablet Per Tube QHS  . sertraline  100 mg Per Tube Daily  . thiamine  100 mg Per Tube Daily   Continuous Infusions:   Principal Problem:   Refractory seizure (HCC) Active Problems:   Acute metabolic encephalopathy   Hypokalemia   Polysubstance abuse (HCC)   Elevated CK   Transaminitis   Chronic hepatitis C without hepatic coma (HCC)   Distended abdomen   Palliative care encounter   Colonic Ileus (HCC)   Organic brain syndrome   On enteral nutrition   Physical deconditioning   Protein-calorie malnutrition (HCC)   Inadequate oral nutritional intake   Consultants:  Neurology  Psychiatry  Interventional radiology  Surgery  Procedures:  9/14 lumbar puncture  9/15 EEG  9/16 EEG  9/17 EEG  9/19 overnight EEG with video  9/22 overnight EEG with video with discontinuation of long-term EEG monitoring on 9/25  9/24 core track placement  10/6 EEG  Antibiotics: Anti-infectives (From admission, onward)   Start     Dose/Rate Route Frequency Ordered Stop   02/11/20 1526  ceFAZolin (ANCEF) IVPB 2g/100 mL  premix        2 g 200 mL/hr over 30 Minutes Intravenous  Once 02/11/20 1526 02/11/20 1641   02/05/20 0600  ceFAZolin (ANCEF) IVPB 2g/100 mL premix        2 g 200 mL/hr over 30 Minutes Intravenous To Short Stay 02/04/20 1054 02/05/20 0950   01/29/20 0600  ceFAZolin (ANCEF) IVPB 2g/100 mL premix        2 g 200 mL/hr over 30 Minutes Intravenous On call to O.R. 01/28/20 1511 01/30/20 0559   01/28/20 2000  cefTRIAXone (ROCEPHIN) 1 g in sodium chloride 0.9 % 100 mL IVPB        1 g 200 mL/hr over 30 Minutes Intravenous Every 24 hours 01/28/20 1925 02/02/20 0724   01/06/20 0900  cefTRIAXone (ROCEPHIN) 2 g in sodium chloride 0.9 % 100 mL IVPB  Status:  Discontinued        2 g 200 mL/hr over 30 Minutes Intravenous Every  12 hours 01/06/20 0105 01/06/20 2202   01/06/20 0800  vancomycin (VANCOCIN) IVPB 1000 mg/200 mL premix  Status:  Discontinued       "Followed by" Linked Group Details   1,000 mg 200 mL/hr over 60 Minutes Intravenous Every 12 hours 01/05/20 1904 01/06/20 2202   01/05/20 2000  vancomycin (VANCOREADY) IVPB 1500 mg/300 mL       "Followed by" Linked Group Details   1,500 mg 150 mL/hr over 120 Minutes Intravenous  Once 01/05/20 1904 01/06/20 0127   01/05/20 1830  cefTRIAXone (ROCEPHIN) 2 g in sodium chloride 0.9 % 100 mL IVPB        2 g 200 mL/hr over 30 Minutes Intravenous  Once 01/05/20 1829 01/05/20 2220       Time spent: 20 minutes    [name] ANP  [facility] Pager [phone]. If 7PM-7AM, please contact night-coverage at [email or web address] 03/04/2020, 11:13 AM  LOS: 58 days

## 2020-03-04 NOTE — Progress Notes (Signed)
Physical Therapy Treatment [name] MRN: [id number] DOB: 10/08/1985 Today's Date: 03/04/2020    History of Present Illness 34 year old with past medical history significant for hepatitis C, polysubstance abuse brought to the hospital 9/13 for disorientation which was suspected to be substance abuse.  After she became more lucid, she was discharged, but then police brought her back later in the day and they found her passed out in a parking lot.  Urine drug screen was positive for benzodiazepine and THC. An EEG done on 9/15 showed patient was in status epilepticus.  Patient was a started on Keppra.  Despite these, EEG noted continued seizures requiring additional medications as well.  Finally on 918, seizures broke. Follow-up EEG on 9/20 noted evidence of [name] from the left central temporal region.  Repeat EEG done on 9/22 noted epileptogenic city from the left central temporal region.  Pt with PEG placed on 02/05/20.     PT Comments    Upon arrival, pt was lying in a pool of diarrhea. The nurse was notified and had reported that she had just cleaned the pt from diarrhea. Therefore, due to uncontrolled continual diarrhea held off on transfers this date. Pt minimally participating, requiring maxA to roll. Also, she participated ~25% of time in LE exercises to resist passive hip flexion and knee flexion to maintain LE strength for mobility. Will continue to follow acutely. Current recommendations remain appropriate.   Follow Up Recommendations  SNF;Supervision/Assistance - 24 hour     Equipment Recommendations  Wheelchair (measurements PT);Wheelchair cushion (measurements PT);Hospital bed;Other (comment);3in1 (PT)    Recommendations for Other Services       Precautions / Restrictions Precautions Precautions: Fall Precaution Comments: Seizure, PEG abdominal binder Restrictions Weight Bearing Restrictions: No    Mobility  Bed Mobility Overal bed  mobility: Needs Assistance Bed Mobility: Rolling Rolling: Max assist         General bed mobility comments: Provided hand-over-hand cues and extra time to allow pt to initiate movement when cued but min movement noted from pt.  Transfers                 General transfer comment: did not attempt due to diarrhea  Ambulation/Gait             General Gait Details: did not attempt due to diarrhea   Stairs             Wheelchair Mobility    Modified Rankin (Stroke Patients Only)       Balance                                            Cognition Arousal/Alertness: Awake/alert Behavior During Therapy: Flat affect Overall Cognitive Status: Difficult to assess                                 General Comments: no verbalization or eye contact      Exercises Other Exercises Other Exercises: Provided passive motion to B LEs into hip flexion, knee flexion, and ankle dorsiflexion with cues provided to pt to extend hips and knees against resistance manually provided, x10-15 reps each with pt pushing ~25% of time    General Comments General comments (skin integrity, edema, etc.): Pt lying in diarrhea upon arrival, notified nurse who reported having just  cleaned her from diarrhea, thus held off on transfers to chair to avoid skin breakdown      Pertinent Vitals/Pain Pain Assessment: Faces Faces Pain Scale: No hurt Pain Intervention(s): Limited activity within patient's tolerance;Monitored during session;Repositioned    Home Living                      Prior Function            PT Goals (current goals can now be found in the care plan section) Acute Rehab PT Goals Patient Stated Goal: unable to state PT Goal Formulation: Patient unable to participate in goal setting Potential to Achieve Goals: Fair Progress towards PT goals: Not progressing toward goals - comment (limited by her cognitive status)     Frequency    Min 2X/week      PT Plan Current plan remains appropriate    Co-evaluation              AM-PAC PT "6 Clicks" Mobility   Outcome Measure  Help needed turning from your back to your side while in a flat bed without using bedrails?: A Lot Help needed moving from lying on your back to sitting on the side of a flat bed without using bedrails?: A Lot Help needed moving to and from a bed to a chair (including a wheelchair)?: A Lot Help needed standing up from a chair using your arms (e.g., wheelchair or bedside chair)?: A Lot Help needed to walk in hospital room?: Total Help needed climbing 3-5 steps with a railing? : Total 6 Click Score: 10    End of Session   Activity Tolerance: Treatment limited secondary to medical complications (Comment) (diarrhea and min initiation when cued) Patient left: in bed;with call bell/phone within reach;with bed alarm set Nurse Communication: Mobility status;Other (comment) (diarrhea) PT Visit Diagnosis: Unsteadiness on feet (R26.81);Muscle weakness (generalized) (M62.81);Difficulty in walking, not elsewhere classified (R26.2);Other symptoms and signs involving the nervous system (R29.898)     Time: 8413-2440 PT Time Calculation (min) (ACUTE ONLY): 18 min  Charges:  $Therapeutic Exercise: 8-22 mins                     Anita Davis, PT, DPT Acute Rehabilitation Services  Pager: [phone] Office: [phone]    Anita Davis 03/04/2020, 2:37 PM

## 2020-03-05 NOTE — Progress Notes (Signed)
PROGRESS NOTE    Anita Davis  EPP:[id number] DOB: 1985/06/14 DOA: 01/04/2020 PCP: [name], PA-C   Assessment & Plan:   Principal Problem:   Refractory seizure (HCC) Active Problems:   Acute metabolic encephalopathy   Hypokalemia   Polysubstance abuse (HCC)   Elevated CK   Transaminitis   Chronic hepatitis C without hepatic coma (HCC)   Distended abdomen   Palliative care encounter   Colonic Ileus (HCC)   Organic brain syndrome   On enteral nutrition   Physical deconditioning   Protein-calorie malnutrition (HCC)   Inadequate oral nutritional intake    Acute on persistent metabolicencephalopathy/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 At worst, type 6. Stool softeners discontinued which were the likely etiology.  Physical deconditioning Suspected chronic depression in context of ongoing polysubstance abuse prior to admission Dislodged gastrostomy tube Dysphagia (resolved) with persistent inadequate oral intake Nonobstructive transaminitis/hepatitis C Nutrition Status/moderate to severe protein calorie malnutrition -Per progress note dated 11/12   DVT prophylaxis: SCDs Code Status:   Code Status: Full Code Family Communication: None at bedside Disposition Plan: Discharge pending safe discharge plan    Subjective: Non-verbal.  Objective: Vitals:   03/04/20 1209 03/04/20 1610 03/05/20 0002 03/05/20 0414  BP: (!) 117/91 132/85 107/80 (!) 137/91  Pulse: 85 74 90 66  Resp: 18 18 18 16   Temp: 98.2 F (36.8 C) 97.6 F (36.4 C) (!) 97.4 F (36.3 C) 97.6 F (36.4 C)  TempSrc: Axillary Axillary Oral Oral  SpO2: 97% 98% 98% 98%  Weight:    71.8 kg  Height:       No intake or output data in the 24 hours ending 03/05/20 0824 Filed Weights   02/27/20 0500 03/04/20 0500 03/05/20 0414  Weight: 74.1 kg 77 kg 71.8 kg    Examination:  General exam:  Appears uncomfortable. Respiratory system: Clear to auscultation. Respiratory effort normal. Cardiovascular system: S1 & S2 heard, RRR. No murmurs, rubs, gallops or clicks. Gastrointestinal system: Abdomen is nondistended, soft and nontender. No organomegaly or masses felt. Normal bowel sounds heard. Abdominal binder in place. Central nervous system: Alert. Tracks me through the room. Musculoskeletal: No edema. No calf tenderness Skin: No cyanosis. No rashes   Data Reviewed: I have personally reviewed following labs and imaging studies  CBC Lab Results  Component Value Date   WBC 4.8 03/03/2020   RBC 4.59 03/03/2020   HGB 14.6 03/03/2020   HCT 43.4 03/03/2020   MCV 94.6 03/03/2020   MCH 31.8 03/03/2020   PLT 345 03/03/2020   MCHC 33.6 03/03/2020   RDW 14.0 03/03/2020   LYMPHSABS 2.3 02/03/2020   MONOABS 0.4 02/03/2020   EOSABS 0.1 02/03/2020   BASOSABS 0.0 02/03/2020     Last metabolic panel Lab Results  Component Value Date   NA 139 03/03/2020   K 4.1 03/03/2020   CL 101 03/03/2020   CO2 27 03/03/2020   BUN 16 03/03/2020   CREATININE 0.53 03/03/2020   GLUCOSE 113 (H) 03/03/2020   GFRNONAA >60 03/03/2020   GFRAA >60 01/25/2020   CALCIUM 9.1 03/03/2020   PHOS 3.6 02/03/2020   PROT 6.8 03/03/2020   ALBUMIN 3.5 03/03/2020   BILITOT 0.6 03/03/2020   ALKPHOS 158 (H) 03/03/2020   AST 165 (H) 03/03/2020   ALT 274 (H) 03/03/2020   ANIONGAP 11 03/03/2020    CBG (last 3)  No results for input(s): GLUCAP in the last 72 hours.  GFR: Estimated Creatinine Clearance: 96.5 mL/min (by C-G formula based on SCr of 0.53 mg/dL).  Coagulation Profile: No results for input(s): INR, PROTIME in the last 168 hours.  No results found for this or any previous visit (from the past 240 hour(s)).      Radiology Studies: No results found.      Scheduled Meds: . amantadine  100 mg Per Tube BID  . Chlorhexidine Gluconate Cloth  6 each Topical Daily  . famotidine  20 mg Per  Tube BID  . feeding supplement  237 mL Oral BID BM  . feeding supplement (PROSource TF)  45 mL Per Tube TID  . free water  200 mL Per Tube Q6H  . lacosamide  200 mg Per Tube BID  . nystatin  5 mL Oral QID  . PHENObarbital  60 mg Per Tube BID  . phenytoin  200 mg Per Tube BID  . potassium chloride  40 mEq Per Tube Daily  . vitamin B-6  100 mg Per Tube Daily  . sertraline  100 mg Per Tube Daily  . thiamine  100 mg Per Tube Daily   Continuous Infusions:   LOS: 59 days     [name], MD [facility] 03/05/2020, 8:24 AM  If 7PM-7AM, please contact night-coverage [email or web address]

## 2020-03-06 ENCOUNTER — Inpatient Hospital Stay (HOSPITAL_COMMUNITY): Payer: Medicaid Other

## 2020-03-06 MED ORDER — POLYETHYLENE GLYCOL 3350 17 G PO PACK
17.0000 g | PACK | Freq: Two times a day (BID) | ORAL | Status: DC
  Administered 2020-03-06 – 2020-03-09 (×6): 17 g
  Filled 2020-03-06 (×6): qty 1

## 2020-03-06 MED ORDER — SENNOSIDES-DOCUSATE SODIUM 8.6-50 MG PO TABS
1.0000 | ORAL_TABLET | Freq: Two times a day (BID) | ORAL | Status: DC
  Administered 2020-03-06 – 2020-03-09 (×6): 1
  Filled 2020-03-06 (×6): qty 1

## 2020-03-06 NOTE — Progress Notes (Signed)
Pt Anita Davis began having an appetite around shift change after previously not wanting to eat during day shift. Patient was more alert and vocal. Patient ate 100% of dinner, also has been drinking fluids. Expressed some distress about "never leaving the hospital."    For 2200 medications, patient wanted to take pills whole. Pills were successful but also wanted to take liquid oral medications whole. Advised per on-call Hospitalist to wait until morning for any possible changes to receiving liquid medications.  As of 04/05/2020 at 0000, Patient is back to a baseline of being less vocal and delayed in responding.   Renard Hamper, RN 03/06/20 12:07AM

## 2020-03-06 NOTE — Progress Notes (Addendum)
PROGRESS NOTE    Anita Davis  WIO:[id number] DOB: 06/21/1985 DOA: 01/04/2020 PCP: [name], PA-C   Assessment & Plan:   Principal Problem:   Refractory seizure (HCC) Active Problems:   Acute metabolic encephalopathy   Hypokalemia   Polysubstance abuse (HCC)   Elevated CK   Transaminitis   Chronic hepatitis C without hepatic coma (HCC)   Distended abdomen   Palliative care encounter   Colonic Ileus (HCC)   Organic brain syndrome   On enteral nutrition   Physical deconditioning   Protein-calorie malnutrition (HCC)   Inadequate oral nutritional intake    Acute on persistent metabolicencephalopathy/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 At worst, type 6. Stool softeners discontinued which were the likely etiology. Patient appears to be uncomfortable again today. Some abdominal distention noted. -Abdominal x-ray -Continue to hold bowel regimen -Addendum: moderate stool burden on x-ray. With patient's symptoms, will restart stool regimen.   Physical deconditioning Suspected chronic depression in context of ongoing polysubstance abuse prior to admission Dislodged gastrostomy tube Dysphagia (resolved) with persistent inadequate oral intake Nonobstructive transaminitis/hepatitis C Nutrition Status/moderate to severe protein calorie malnutrition -Per progress note dated 11/12   DVT prophylaxis: SCDs Code Status:   Code Status: Full Code Family Communication: None at bedside Disposition Plan: Discharge pending safe discharge plan    Subjective: Non-verbal  Objective: Vitals:   03/05/20 2018 03/05/20 2355 03/06/20 0410 03/06/20 0427  BP: 119/76 108/78 116/85   Pulse: 83 82 66   Resp: 19 19 16    Temp: (!) 97.4 F (36.3 C) 98.2 F (36.8 C) 98.2 F (36.8 C)   TempSrc: Oral Oral    SpO2: 100% 100% 99%   Weight:    70.2 kg  Height:         Intake/Output Summary (Last 24 hours) at 03/06/2020 0842 Last data filed at 03/05/2020 2355 Gross per 24 hour  Intake 480 ml  Output --  Net 480 ml   Filed Weights   03/04/20 0500 03/05/20 0414 03/06/20 0427  Weight: 77 kg 71.8 kg 70.2 kg    Examination:  General exam: Appears uncomfortable. Writhing. Respiratory system: Clear to auscultation. Respiratory effort normal. Cardiovascular system: S1 & S2 heard, RRR. No murmurs, rubs, gallops or clicks. Gastrointestinal system: Abdomen is distended, soft and does not appear to be tender. No organomegaly or masses felt. Decreased bowel sounds heard. Central nervous system: Alert. Musculoskeletal: No edema. No calf tenderness Skin: No cyanosis. No rashes    Data Reviewed: I have personally reviewed following labs and imaging studies  CBC Lab Results  Component Value Date   WBC 4.8 03/03/2020   RBC 4.59 03/03/2020   HGB 14.6 03/03/2020   HCT 43.4 03/03/2020   MCV 94.6 03/03/2020   MCH 31.8 03/03/2020   PLT 345 03/03/2020   MCHC 33.6 03/03/2020   RDW 14.0 03/03/2020   LYMPHSABS 2.3 02/03/2020   MONOABS 0.4 02/03/2020   EOSABS 0.1 02/03/2020   BASOSABS 0.0 02/03/2020     Last metabolic panel Lab Results  Component Value Date   NA 139 03/03/2020   K 4.1 03/03/2020   CL 101 03/03/2020   CO2 27 03/03/2020   BUN 16 03/03/2020   CREATININE 0.53 03/03/2020   GLUCOSE 113 (H) 03/03/2020   GFRNONAA >60 03/03/2020   GFRAA >60 01/25/2020   CALCIUM 9.1 03/03/2020   PHOS 3.6 02/03/2020   PROT 6.8 03/03/2020   ALBUMIN 3.5 03/03/2020  BILITOT 0.6 03/03/2020   ALKPHOS 158 (H) 03/03/2020   AST 165 (H) 03/03/2020   ALT 274 (H) 03/03/2020   ANIONGAP 11 03/03/2020    CBG (last 3)  No results for input(s): GLUCAP in the last 72 hours.   GFR: Estimated Creatinine Clearance: 95.6 mL/min (by C-G formula based on SCr of 0.53 mg/dL).  Coagulation Profile: No results for input(s): INR, PROTIME in the last 168 hours.  No  results found for this or any previous visit (from the past 240 hour(s)).      Radiology Studies: No results found.      Scheduled Meds: . amantadine  100 mg Per Tube BID  . Chlorhexidine Gluconate Cloth  6 each Topical Daily  . famotidine  20 mg Per Tube BID  . feeding supplement  237 mL Oral BID BM  . feeding supplement (PROSource TF)  45 mL Per Tube TID  . free water  200 mL Per Tube Q6H  . lacosamide  200 mg Per Tube BID  . nystatin  5 mL Oral QID  . PHENObarbital  60 mg Per Tube BID  . phenytoin  200 mg Per Tube BID  . potassium chloride  40 mEq Per Tube Daily  . vitamin B-6  100 mg Per Tube Daily  . sertraline  100 mg Per Tube Daily  . thiamine  100 mg Per Tube Daily   Continuous Infusions:   LOS: 60 days     [name], MD [facility] 03/06/2020, 8:42 AM  If 7PM-7AM, please contact night-coverage [email or web address]

## 2020-03-07 MED ORDER — ARIPIPRAZOLE 2 MG PO TABS
2.0000 mg | ORAL_TABLET | Freq: Every day | ORAL | Status: DC
  Administered 2020-03-07 – 2020-03-08 (×2): 2 mg via ORAL
  Filled 2020-03-07 (×2): qty 1

## 2020-03-07 MED ORDER — GERHARDT'S BUTT CREAM
TOPICAL_CREAM | CUTANEOUS | Status: DC | PRN
  Filled 2020-03-07: qty 1

## 2020-03-07 MED ORDER — MODAFINIL 100 MG PO TABS
200.0000 mg | ORAL_TABLET | Freq: Every day | ORAL | Status: DC
  Administered 2020-03-07 – 2020-03-11 (×5): 200 mg via ORAL
  Filled 2020-03-07 (×5): qty 2

## 2020-03-07 NOTE — Plan of Care (Signed)
  Problem: Clinical Measurements: Goal: Ability to maintain clinical measurements within normal limits will improve Outcome: Progressing   Problem: Clinical Measurements: Goal: Will remain free from infection Outcome: Progressing   Problem: Education: Goal: Knowledge of General Education information will improve Description: Including pain rating scale, medication(s)/side effects and non-pharmacologic comfort measures Outcome: Progressing

## 2020-03-07 NOTE — Progress Notes (Signed)
At the change of shift last night pt was fluent and communicably and verbally expressed her needs well Ambulated  to the bath room to bath herself with minimal assit by the nurse . Pt was very alert   and verbally responsive. After 2300 pt became withdrawn and will not answer any question asked by nurse nor communicate verbally. She was incontinent of B+ B x  2 and  had to be  cleaned up by staff.   No seizure noted but pt refused to talk . Only stares at staff when talking to her. Pt may need Psych evaluation.                                                                                                                                                          =

## 2020-03-07 NOTE — Progress Notes (Addendum)
[facility] PROGRESS NOTE  Anita Davis DEY:[id number] DOB: 11/09/1985 DOA: 01/04/2020 PCP: Anita Hawking, PA-C     10/20 nonverbal and not making eye contact or following simple commands     10/25: Anita Davis much more alert today.  Eventually she was able to talk and express her needs.  Subsequently she was rolled out to the nurses station to continue to improve her socialization.  She was requesting a Covid vaccine.   Status: Inpatient-Remains inpatient appropriate because:Altered mental status and Unsafe d/c plan Medicaid and disability applications have been completed and are pending.  Unfortunately patient continues to have no bed offers for SNF at this juncture.   Dispo: The patient is from: Home              Anticipated d/c is to: SNF              Anticipated d/c date is: > 3 days              Patient currently is medically stable to d/c.  Due to UNSAFE DC PLAN-patient needs placement in skilled nursing facility based on altered mentation and inability to manage self-care.  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 Do not expect recovery to previous baseline  Due to inconsistent alertness and thus inadequate oral intake of foods patient will continue G-tube for feedings and medications  **7/15 nursing staff informed me that they had seen consistently in the evening hours patient having several hours of appropriate wakefulness as evidenced by meaningful conversation,, ambulating and performing self-care.  Today's RN reported that yesterday evening patient had conversation stating that she has felt much better now that she is off of methadone.  She also reported that she was very sad.**   Code Status: Full Family Communication: Updated Anita Davis "[id number] [name] by voicemail on 11/15 DVT prophylaxis: Lovenox Vaccination status: Anita Davis confirmed pt given [employer] COVID vaccine  in June. 2nd vaccine given 11/2  HPI: 34 y.o. F with hx substance use disorder, active, hepatitis C, and gestational diabetes who presented with confusion.  Patient initially admitted in September with encephalopathy, thought to be from drug overdose.  LP showed no signs of infection. MRI brain unremarkable. She remained encephalopathic, and EEG showed focal seizures.  The seizures were suppressed, and she was transferred to [facility] for continuous EEG, but after seizure control, she had poor neurologic recovery.  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  She deteriorated after steroids were stopped, so IVIG was started.  Since admission patient has had waxing and waning mentation ranging from completely unresponsive to quite alert, confused and verbal and able to express her needs.  Unfortunately these episodes of wakefulness are never sustained and patient will likely be struggling with abnormal mentation for a sustained period of time therefore long-term SNF recommended.  She is unable to eat enough by mouth therefore tube feedings continue per PEG  Subjective: Awake.  Does not attempt to verbalize.  Inconsistent eye contact during communication.  RN at bedside feeding patient and had to remind patient to chew and swallow food.  Encourage patient that she has having episodes of wakefulness but that she needs to try her best to wake up consistently during the day otherwise she will end up residing in a skilled nursing facility long-term.  Also encouraged her that her and Anita Davis likely will be able  to take care of her and as she improves and stays away from narcotics she should be able to have contact and potential custody of her children again.  Objective: Vitals:   03/07/20 0414 03/07/20 0743  BP: (!) 113/91 120/84  Pulse: 93 91  Resp: 18 20  Temp: 97.7 F (36.5 C) 97.7 F (36.5 C)  SpO2: 100% 97%     Intake/Output Summary (Last 24 hours) at 03/07/2020 1054 Last data filed at 03/06/2020 2337 Gross per 24 hour  Intake 520 ml  Output 230 ml  Net 290 ml   Filed Weights   03/04/20 0500 03/05/20 0414 03/06/20 0427  Weight: 77 kg 71.8 kg 70.2 kg    Exam: Constitutional: Awake, nonverbal, restless earlier but after nurse set patient up in bed to eat restlessness resolved Respiratory: CTA bilaterally, Normal respiratory effort.-RA Cardiovascular: Regular rate and rhythm. No extremity edema.  Adequate capillary refill.    Abdomen: no tenderness palpation,G-tube in place with tube feeding infusing.  Nursing feeding patient at bedside. Musculoskeletal: No contractures.  No tremors or spasticity  Neurologic: CN 2-12 grossly intact, currently not following simple commands but does have spontaneous movement of extremities Psychiatric: Awake,no eye contact today when spoken to.  Nonverbal and does not  follow any commands.  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  No definitive etiology determined.  Treated as autoimmune encephalitis with steroids and IVIG with no significant improvement -Follow-up EEG in the past 30 days without evidence of reemergence of seizures/status epilepticus -Continues with cyclic episodes of wakefulness versus minimally responsive and nonverbal state -Discussed with neurologist Anita Davis and will discontinue Symmetrel in favor of Provigil to see if will provide more consistency regarding periods of wakefulness.  Of note if this medication needs to be continued upon discharge [facility] will [facility] -Encourage family to visit more frequently to see if this improves patient's mentation and interaction  -d/w  psych- rec Abilify to augment the Zoloft and tx depression  Diarrhea -Multiple episodes on 11/12 therefore laxatives stopped -11/14 diarrhea resolved and patient with abdominal bloating.  X-ray revealed stool burden therefore bowel regimen resumed  Suspected oral Candida -Continue nystatin swish and swallow 4 times daily  Physical deconditioning -Working with PT and OT-previously had been motivated by food (chocolate) -Continues to require extensive assist for all ADLs with recommendation for SNF -Continue PT OT-he is able to ambulate with assistance but is very impulsive and requires a rolling walker and 100% supervision  Suspected chronic depression in context of ongoing polysubstance abuse prior to admission -History obtained from family that patient has been demonstrating symptoms of depression for several months that had worsened just prior to presentation -Waxing and waning mental status 2/2 neurological insult and severe depression -neurology suspects depression contributing to waxing and waning symptoms. -11/8 psych increased Zoloft to 100 mg daily  Dysphagia (resolved) with persistent inadequate oral intake - SLP recommended regular diet with thin liquids but with waxing and waning alertness -continue intermittent nocturnal tube feedings given inconsistent intake of solid diet -Continue nocturnal tube feedings-Ensure boluses 2 times daily between meals  Nutrition Status/moderate to severe protein calorie malnutrition: Nutrition Problem: Inadequate oral intake Etiology: lethargy/confusion Signs/Symptoms: meal completion < 25% Interventions: Tube feeding, Prostat Estimated body mass index is 25.75 kg/m as calculated from the following:   Height as  of this encounter: 5\' 5"  (1.651 m).   Weight as of this encounter: 70.2 kg.     Other problems: Dislodged gastrostomy tube -Placed by IR on 10/21  Nonobstructive transaminitis/hepatitis C -Elevated HCV antibody with markedly  elevated HCV RNA quantitative level  consistent with chronic hepatitis C  -None total bilirubin normal with slightly elevated alkaline phosphatase with AST 165 and ALT 274 -11/10: Discussed with ID Dr. 11-15-2005.  Treatment of hepatitis C typically is initiated in the outpatient setting.  Medications can be quite expensive and usually take time to obtain insurance approval.  Also since patient likely go to skilled nursing facility expensive medication could be barrier to admission. -ID recommends scheduling outpatient appointment their clinic closer to discharge date  Data Reviewed: Basic Metabolic Panel: Recent Labs  Lab 03/03/20 0045  NA 139  K 4.1  CL 101  CO2 27  GLUCOSE 113*  BUN 16  CREATININE 0.53  CALCIUM 9.1   Liver Function Tests: Recent Labs  Lab 03/03/20 0045  AST 165*  ALT 274*  ALKPHOS 158*  BILITOT 0.6  PROT 6.8  ALBUMIN 3.5   No results for input(s): LIPASE, AMYLASE in the last 168 hours. No results for input(s): AMMONIA in the last 168 hours. CBC: Recent Labs  Lab 03/03/20 0045  WBC 4.8  HGB 14.6  HCT 43.4  MCV 94.6  PLT 345   Cardiac Enzymes: No results for input(s): CKTOTAL, CKMB, CKMBINDEX, TROPONINI in the last 168 hours. BNP (last 3 results) No results for input(s): BNP in the last 8760 hours.  ProBNP (last 3 results) No results for input(s): PROBNP in the last 8760 hours.  CBG: No results for input(s): GLUCAP in the last 168 hours.  No results found for this or any previous visit (from the past 240 hour(s)).   Studies: DG Abd Portable 1V  Result Date: 03/06/2020 CLINICAL DATA:  Abdominal distension EXAM: PORTABLE ABDOMEN - 1 VIEW COMPARISON:  February 22, 2020 FINDINGS: Air and stool-filled nondilated loops of bowel. Mild-to-moderate colonic stool burden in the LEFT hemicolon. G-tube. No acute osseous abnormality. IMPRESSION: Nonobstructive bowel gas pattern. Electronically Signed   By: February 24, 2020 MD   On: 03/06/2020 12:46     Scheduled Meds: . Chlorhexidine Gluconate Cloth  6 each Topical Daily  . famotidine  20 mg Per Tube BID  . feeding supplement  237 mL Oral BID BM  . feeding supplement (PROSource TF)  45 mL Per Tube TID  . free water  200 mL Per Tube Q6H  . lacosamide  200 mg Per Tube BID  . modafinil  200 mg Oral Daily  . nystatin  5 mL Oral QID  . PHENObarbital  60 mg Per Tube BID  . phenytoin  200 mg Per Tube BID  . polyethylene glycol  17 g Per Tube BID  . potassium chloride  40 mEq Per Tube Daily  . vitamin B-6  100 mg Per Tube Daily  . senna-docusate  1 tablet Per Tube BID  . sertraline  100 mg Per Tube Daily  . thiamine  100 mg Per Tube Daily   Continuous Infusions:   Principal Problem:   Refractory seizure (HCC) Active Problems:   Acute metabolic encephalopathy   Hypokalemia   Polysubstance abuse (HCC)   Elevated CK   Transaminitis   Chronic hepatitis C without hepatic coma (HCC)   Distended abdomen   Palliative care encounter   Colonic Ileus (HCC)   Organic brain syndrome   On enteral  nutrition   Physical deconditioning   Protein-calorie malnutrition (HCC)   Inadequate oral nutritional intake   Consultants:  Neurology  Psychiatry  Interventional radiology  Surgery  Procedures:  9/14 lumbar puncture  9/15 EEG  9/16 EEG  9/17 EEG  9/19 overnight EEG with video  9/22 overnight EEG with video with discontinuation of long-term EEG monitoring on 9/25  9/24 core track placement  10/6 EEG  Antibiotics: Anti-infectives (From admission, onward)   Start     Dose/Rate Route Frequency Ordered Stop   02/11/20 1526  ceFAZolin (ANCEF) IVPB 2g/100 mL premix        2 g 200 mL/hr over 30 Minutes Intravenous  Once 02/11/20 1526 02/11/20 1641   02/05/20 0600  ceFAZolin (ANCEF) IVPB 2g/100 mL premix        2 g 200 mL/hr over 30 Minutes Intravenous To Short Stay 02/04/20 1054 02/05/20 0950   01/29/20 0600  ceFAZolin (ANCEF) IVPB 2g/100 mL premix        2 g 200  mL/hr over 30 Minutes Intravenous On call to O.R. 01/28/20 1511 01/30/20 0559   01/28/20 2000  cefTRIAXone (ROCEPHIN) 1 g in sodium chloride 0.9 % 100 mL IVPB        1 g 200 mL/hr over 30 Minutes Intravenous Every 24 hours 01/28/20 1925 02/02/20 0724   01/06/20 0900  cefTRIAXone (ROCEPHIN) 2 g in sodium chloride 0.9 % 100 mL IVPB  Status:  Discontinued        2 g 200 mL/hr over 30 Minutes Intravenous Every 12 hours 01/06/20 0105 01/06/20 2202   01/06/20 0800  vancomycin (VANCOCIN) IVPB 1000 mg/200 mL premix  Status:  Discontinued       "Followed by" Linked Group Details   1,000 mg 200 mL/hr over 60 Minutes Intravenous Every 12 hours 01/05/20 1904 01/06/20 2202   01/05/20 2000  vancomycin (VANCOREADY) IVPB 1500 mg/300 mL       "Followed by" Linked Group Details   1,500 mg 150 mL/hr over 120 Minutes Intravenous  Once 01/05/20 1904 01/06/20 0127   01/05/20 1830  cefTRIAXone (ROCEPHIN) 2 g in sodium chloride 0.9 % 100 mL IVPB        2 g 200 mL/hr over 30 Minutes Intravenous  Once 01/05/20 1829 01/05/20 2220       Time spent: 20 minutes    [name] ANP  [facility] Pager [phone]. If 7PM-7AM, please contact night-coverage at [email or web address] 03/07/2020, 10:54 AM  LOS: 61 days

## 2020-03-08 MED ORDER — NYSTATIN 100000 UNIT/ML MT SUSP
5.0000 mL | Freq: Four times a day (QID) | OROMUCOSAL | Status: DC
  Administered 2020-03-08 – 2020-03-15 (×24): 500000 [IU] via OROMUCOSAL
  Filled 2020-03-08 (×21): qty 5

## 2020-03-08 MED ORDER — ARIPIPRAZOLE 2 MG PO TABS
2.0000 mg | ORAL_TABLET | Freq: Every day | ORAL | Status: DC
  Administered 2020-03-09 – 2020-03-14 (×6): 2 mg
  Filled 2020-03-08 (×7): qty 1

## 2020-03-08 MED ORDER — GERHARDT'S BUTT CREAM
1.0000 "application " | TOPICAL_CREAM | Freq: Three times a day (TID) | CUTANEOUS | Status: DC
  Administered 2020-03-08 – 2020-05-01 (×103): 1 via TOPICAL
  Filled 2020-03-08 (×4): qty 1

## 2020-03-08 NOTE — Progress Notes (Signed)
Physical Therapy Treatment [name] MRN: [id number] DOB: 08/12/1985 Today's Date: 03/08/2020    History of Present Illness 34 year old with past medical history significant for hepatitis C, polysubstance abuse brought to the hospital 9/13 for disorientation which was suspected to be substance abuse.  After she became more lucid, she was discharged, but then police brought her back later in the day and they found her passed out in a parking lot.  Urine drug screen was positive for benzodiazepine and THC. An EEG done on 9/15 showed patient was in status epilepticus.  Patient was a started on Keppra.  Despite these, EEG noted continued seizures requiring additional medications as well.  Finally on 9/18, seizures broke. Follow-up EEG on 9/20 noted evidence of epilepticity from the left central temporal region.  Repeat EEG done on 9/22 noted epileptogenicity from the left central temporal region.  Pt with PEG placed on 02/05/20.     PT Comments    Pt was able to participate loosely in therapy session EOB.  We stood x 3, one time holding a partial squat for 3 mins, other times less successful (especially when I tried the RW).  Pt was non verbal throughout the session making some eye contact and then staring off into space again.  She did not follow any commands, but was reaching for water and placing cup back down throughout session (purposeful movement).  She remains appropriate for SNF placement at discharge.  Goals updated.    Follow Up Recommendations  SNF;Supervision/Assistance - 24 hour     Equipment Recommendations  Wheelchair (measurements PT);Wheelchair cushion (measurements PT);Hospital bed;Other (comment);3in1 (PT)    Recommendations for Other Services       Precautions / Restrictions Precautions Precautions: Fall Precaution Comments: Seizure, PEG abdominal binder Restrictions Weight Bearing Restrictions: No    Mobility  Bed Mobility Overal bed  mobility: Needs Assistance Bed Mobility: Supine to Sit;Sit to Supine     Supine to sit: Min assist Sit to supine: Mod assist   General bed mobility comments: Min assist to come to EOB as pt was long sitting in bed with bed alarm going off when PT entered the room.   Transfers Overall transfer level: Needs assistance Equipment used: 1 person hand held assist Transfers: Sit to/from Stand Sit to Stand: Mod assist         General transfer comment: Heavy mod assist to come to standing EOB requiring repeated verbal and tactile cues rocking forward over her feet.  Heavy mod assist to power up to squat position which she held for >3 mins with R hand on recliner armrest and L hand in therapist's hand knees blocked. Attempted to stand with RW and that was much harder and less successful.  Stood x 3  Ambulation/Gait                 Social research officer, government Rankin (Stroke Patients Only)       Balance Overall balance assessment: Needs assistance Sitting-balance support: Feet supported;Single extremity supported Sitting balance-Leahy Scale: Good Sitting balance - Comments: close supervision EOB, decreased awareness of edge of bed making her a safety risk for sliding off of the side of the bed.     Standing balance support: Bilateral upper extremity supported Standing balance-Leahy Scale: Poor Standing balance comment: needs external support in standing  Cognition Arousal/Alertness: Awake/alert Behavior During Therapy: Flat affect Overall Cognitive Status: Difficult to assess Area of Impairment: Attention;Following commands;Safety/judgement;Problem solving;Awareness                   Current Attention Level: Focused   Following Commands:  (did not follow any commands for me today) Safety/Judgement: Decreased awareness of safety;Decreased awareness of deficits (attempting to exit bed) Awareness:  Intellectual Problem Solving: Slow processing;Requires verbal cues;Difficulty sequencing;Decreased initiation;Requires tactile cues General Comments: Slow to move, process, did not follow any commands for me, but did do spontaneous things such as reach for a cup of water and then place it back down.       Exercises General Exercises - Upper Extremity Shoulder Flexion: AAROM;Both;10 reps Elbow Flexion: AAROM;Both;10 reps General Exercises - Lower Extremity Long Arc Quad: PROM;Both;10 reps Other Exercises Other Exercises: At times helpful and at times resistive to ROM especially elbow flexion.     General Comments        Pertinent Vitals/Pain Pain Assessment: Faces Faces Pain Scale: No hurt    Home Living                      Prior Function            PT Goals (current goals can now be found in the care plan section) Acute Rehab PT Goals Patient Stated Goal: none stated mute the entire session. PT Goal Formulation: With patient Time For Goal Achievement: 03/22/20 Potential to Achieve Goals: Fair Progress towards PT goals: Progressing toward goals    Frequency    Min 2X/week      PT Plan Current plan remains appropriate    Co-evaluation              AM-PAC PT "6 Clicks" Mobility   Outcome Measure  Help needed turning from your back to your side while in a flat bed without using bedrails?: A Lot Help needed moving from lying on your back to sitting on the side of a flat bed without using bedrails?: A Lot Help needed moving to and from a bed to a chair (including a wheelchair)?: A Lot Help needed standing up from a chair using your arms (e.g., wheelchair or bedside chair)?: A Lot Help needed to walk in hospital room?: A Lot Help needed climbing 3-5 steps with a railing? : Total 6 Click Score: 11    End of Session   Activity Tolerance: Patient tolerated treatment well Patient left: in bed;with call bell/phone within reach;with bed alarm set    PT Visit Diagnosis: Unsteadiness on feet (R26.81);Muscle weakness (generalized) (M62.81);Difficulty in walking, not elsewhere classified (R26.2);Other symptoms and signs involving the nervous system (E99.371)     Time: 6967-8938 PT Time Calculation (min) (ACUTE ONLY): 27 min  Charges:  $Therapeutic Activity: 23-37 mins                     Corinna Capra, PT, DPT  Acute Rehabilitation [phone] pager [phone]) [phone] office

## 2020-03-08 NOTE — Progress Notes (Signed)
[facility] PROGRESS NOTE  Anita Davis IFO:[id number] DOB: July 16, 1985 DOA: 01/04/2020 PCP: [name], PA-C     10/20 nonverbal and not making eye contact or following simple commands     10/25: Anita Davis much more alert today.  Eventually she was able to talk and express her needs.  Subsequently she was rolled out to the nurses station to continue to improve her socialization.  She was requesting a Covid vaccine.   Status: Inpatient-Remains inpatient appropriate because:Altered mental status and Unsafe d/c plan Medicaid and disability applications have been completed and are pending.  Unfortunately patient continues to have no bed offers for SNF at this juncture.   Dispo: The patient is from: Home              Anticipated d/c is to: SNF              Anticipated d/c date is: > 3 days              Patient currently is medically stable to d/c.  Due to UNSAFE DC PLAN-patient needs placement in skilled nursing facility based on altered mentation and inability to manage self-care.  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 Do not expect recovery to previous baseline  Due to inconsistent alertness and thus inadequate oral intake of foods patient will continue G-tube for feedings and medications   Code Status: Full Family Communication: Updated [name] "[address] [name] by voicemail on 11/15 DVT prophylaxis: Lovenox Vaccination status: [name] confirmed pt given [employer] COVID vaccine in June. 2nd vaccine given 11/2  HPI: 34 y.o. F with hx substance use disorder, active, hepatitis C, and gestational diabetes who presented with confusion.  Patient initially admitted in September with encephalopathy, thought to be from drug overdose.  LP showed no signs of infection. MRI brain unremarkable. She remained encephalopathic, and EEG showed focal seizures.  The seizures were suppressed,  and she was transferred to [facility] for continuous EEG, but after seizure control, she had poor neurologic recovery.  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  She deteriorated after steroids were stopped, so IVIG was started.  Since admission patient has had waxing and waning mentation ranging from completely unresponsive to quite alert, confused and verbal and able to express her needs.  Unfortunately these episodes of wakefulness are never sustained and patient will likely be struggling with abnormal mentation for a sustained period of time therefore long-term SNF recommended.  She is unable to eat enough by mouth therefore tube feedings continue per PEG  Subjective: Awake and very restless, fidgeting in chair.  Less fidgety once rolled up to nursing station.  Continues to remain nonverbal.  Makes some eye contact when spoken to.  Not following commands consistently when asked.  Objective: Vitals:   03/08/20 0732 03/08/20 1220  BP: 121/76 120/85  Pulse: 94 96  Resp: 20 17  Temp: 98.7 F (37.1 C) 98.5 F (36.9 C)  SpO2: 97% 98%    Intake/Output Summary (Last 24 hours) at 03/08/2020 1245 Last data filed at 03/08/2020 0530 Gross per 24 hour  Intake --  Output 250 ml  Net -250 ml   Filed Weights   03/05/20 0414 03/06/20 0427 03/08/20 0500  Weight: 71.8 kg 70.2 kg 71 kg    Exam: Constitutional: Awake, nonverbal, fidgety but improved after chair rolled out to the nurses station Respiratory: CTA  bilaterally, Normal respiratory effort.-RA Cardiovascular: Regular rate and rhythm. Adequate capillary refill.    Abdomen: non tender,G-tube in place with tube feeding.  Musculoskeletal: No contractures.  No tremors or spasticity  Neurologic: CN 2-12 grossly intact, currently not following simple commands but does have spontaneous movement of extremities Psychiatric: Awake, intermittent eye contact today when  spoken to.  Nonverbal and does not consistently follow any commands.  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  No definitive etiology determined.  Treated as autoimmune encephalitis with steroids and IVIG with no significant improvement -Follow-up EEG in the past 30 days without evidence of reemergence of seizures/status epilepticus -Continues with cyclic episodes of wakefulness versus minimally responsive and nonverbal state-apparently when alert this occurs more in the evening hours then daytime hours -11/15 discussed with neurologist/Yadav -discontinued Symmetrel in favor of Provigil.(if this medication needs to be continued upon discharge we will need to discuss with [facility] regarding preauthorization) -Spoke with family on 11/15.  Encouraged to visit more frequently to see if this improves mentation and interaction of patient.  Also encouraged to bring familiar items and photos from home.    -1/15 d/w psych upon the recommendation added Abilify to current regimen of Zoloft to treat depression  Diarrhea -Multiple episodes on 11/12 therefore laxatives stopped -11/14 diarrhea resolved and patient with abdominal bloating.  X-ray revealed stool burden therefore bowel regimen resumed  Suspected oral Candida -Continue nystatin swish and swallow 4 times daily  Physical deconditioning -Working with PT and OT-previously had been motivated by food (chocolate) -Continues to require extensive assist for all ADLs with recommendation for SNF -Continue PT OT-he is able to ambulate with assistance but is very impulsive and requires a rolling walker and 100% supervision  Suspected chronic depression in context of ongoing polysubstance abuse prior to admission -History obtained from  family that patient has been demonstrating symptoms of depression for several months that had worsened just prior to presentation -Waxing and waning mental status 2/2 neurological insult and severe depression  -11/8 psych increased Zoloft to 100 mg daily and Abilify was added on 11/15  Dysphagia (resolved) with persistent inadequate oral intake - SLP recommended regular diet with thin liquids but with waxing and waning alertness -continue intermittent nocturnal tube feedings given inconsistent intake of solid diet -Continue nocturnal tube feedings-Ensure boluses 2 times daily between meals  Nutrition Status/moderate to severe protein calorie malnutrition: Nutrition Problem: Inadequate oral intake Etiology: lethargy/confusion Signs/Symptoms: meal completion < 25% Interventions: Tube feeding, Prostat Estimated body mass index is 26.05 kg/m as calculated from the following:   Height as of this encounter: 5\' 5"  (1.651 m).   Weight as of this encounter: 71 kg.     Other problems: Dislodged gastrostomy tube -Placed by IR on 10/21  Nonobstructive transaminitis/hepatitis C -Elevated HCV antibody with markedly elevated HCV RNA quantitative level  consistent with chronic hepatitis C  -None total bilirubin normal with slightly elevated alkaline phosphatase with AST 165 and ALT 274 -11/10: Discussed with ID Dr. 11-15-2005.  Treatment of hepatitis C typically is initiated in the outpatient setting.  Medications can be quite expensive and usually take time to obtain insurance approval.  Also since patient likely go to skilled nursing facility expensive medication could be barrier to admission. -ID recommends scheduling outpatient appointment their clinic closer to discharge date  Data Reviewed: Basic Metabolic Panel: Recent Labs  Lab 03/03/20 0045  NA 139  K 4.1  CL 101  CO2 27  GLUCOSE 113*  BUN 16  CREATININE 0.53  CALCIUM 9.1   Liver Function Tests: Recent Labs  Lab 03/03/20 0045   AST 165*  ALT 274*  ALKPHOS 158*  BILITOT 0.6  PROT 6.8  ALBUMIN 3.5   No results for input(s): LIPASE, AMYLASE in the last 168 hours. No results for input(s): AMMONIA in the last 168 hours. CBC: Recent Labs  Lab 03/03/20 0045  WBC 4.8  HGB 14.6  HCT 43.4  MCV 94.6  PLT 345   Cardiac Enzymes: No results for input(s): CKTOTAL, CKMB, CKMBINDEX, TROPONINI in the last 168 hours. BNP (last 3 results) No results for input(s): BNP in the last 8760 hours.  ProBNP (last 3 results) No results for input(s): PROBNP in the last 8760 hours.  CBG: No results for input(s): GLUCAP in the last 168 hours.  No results found for this or any previous visit (from the past 240 hour(s)).   Studies: No results found.  Scheduled Meds: . ARIPiprazole  2 mg Oral Daily  . Chlorhexidine Gluconate Cloth  6 each Topical Daily  . famotidine  20 mg Per Tube BID  . feeding supplement  237 mL Oral BID BM  . feeding supplement (PROSource TF)  45 mL Per Tube TID  . free water  200 mL Per Tube Q6H  . lacosamide  200 mg Per Tube BID  . modafinil  200 mg Oral Daily  . nystatin  5 mL Oral QID  . PHENObarbital  60 mg Per Tube BID  . phenytoin  200 mg Per Tube BID  . polyethylene glycol  17 g Per Tube BID  . potassium chloride  40 mEq Per Tube Daily  . vitamin B-6  100 mg Per Tube Daily  . senna-docusate  1 tablet Per Tube BID  . sertraline  100 mg Per Tube Daily  . thiamine  100 mg Per Tube Daily   Continuous Infusions:   Principal Problem:   Refractory seizure (HCC) Active Problems:   Acute metabolic encephalopathy   Hypokalemia   Polysubstance abuse (HCC)   Elevated CK   Transaminitis   Chronic hepatitis C without hepatic coma (HCC)   Distended abdomen   Palliative care encounter   Colonic Ileus (HCC)   Organic brain syndrome   On enteral nutrition   Physical deconditioning   Protein-calorie malnutrition (HCC)   Inadequate oral nutritional  intake   Consultants:  Neurology  Psychiatry  Interventional radiology  Surgery  Procedures:  9/14 lumbar puncture  9/15 EEG  9/16 EEG  9/17 EEG  9/19 overnight EEG with video  9/22 overnight EEG with video with discontinuation of long-term EEG monitoring on 9/25  9/24 core track placement  10/6 EEG  Antibiotics: Anti-infectives (From admission, onward)   Start     Dose/Rate Route Frequency Ordered Stop   02/11/20 1526  ceFAZolin (ANCEF) IVPB 2g/100 mL premix        2 g 200 mL/hr over 30 Minutes Intravenous  Once 02/11/20 1526 02/11/20 1641   02/05/20 0600  ceFAZolin (ANCEF) IVPB 2g/100 mL premix        2 g 200 mL/hr over 30 Minutes Intravenous To Short Stay 02/04/20 1054 02/05/20 0950   01/29/20 0600  ceFAZolin (ANCEF) IVPB 2g/100 mL premix        2 g 200 mL/hr over 30 Minutes Intravenous On call to O.R. 01/28/20 1511 01/30/20 0559  01/28/20 2000  cefTRIAXone (ROCEPHIN) 1 g in sodium chloride 0.9 % 100 mL IVPB        1 g 200 mL/hr over 30 Minutes Intravenous Every 24 hours 01/28/20 1925 02/02/20 0724   01/06/20 0900  cefTRIAXone (ROCEPHIN) 2 g in sodium chloride 0.9 % 100 mL IVPB  Status:  Discontinued        2 g 200 mL/hr over 30 Minutes Intravenous Every 12 hours 01/06/20 0105 01/06/20 2202   01/06/20 0800  vancomycin (VANCOCIN) IVPB 1000 mg/200 mL premix  Status:  Discontinued       "Followed by" Linked Group Details   1,000 mg 200 mL/hr over 60 Minutes Intravenous Every 12 hours 01/05/20 1904 01/06/20 2202   01/05/20 2000  vancomycin (VANCOREADY) IVPB 1500 mg/300 mL       "Followed by" Linked Group Details   1,500 mg 150 mL/hr over 120 Minutes Intravenous  Once 01/05/20 1904 01/06/20 0127   01/05/20 1830  cefTRIAXone (ROCEPHIN) 2 g in sodium chloride 0.9 % 100 mL IVPB        2 g 200 mL/hr over 30 Minutes Intravenous  Once 01/05/20 1829 01/05/20 2220       Time spent: 20 minutes    Junious SilkAllison Maribelle Hopple ANP  [facility] Pager [phone]. If  7PM-7AM, please contact night-coverage at [email or web address] 03/08/2020, 12:45 PM  LOS: 62 days

## 2020-03-08 NOTE — Progress Notes (Signed)
Occupational Therapy Treatment [name] MRN: [id number] DOB: 1985-06-10 Today's Date: 03/08/2020    History of present illness 34 year old with past medical history significant for hepatitis C, polysubstance abuse brought to the hospital 9/13 for disorientation which was suspected to be substance abuse.  After she became more lucid, she was discharged, but then police brought her back later in the day and they found her passed out in a parking lot.  Urine drug screen was positive for benzodiazepine and THC. An EEG done on 9/15 showed patient was in status epilepticus.  Patient was a started on Keppra.  Despite these, EEG noted continued seizures requiring additional medications as well.  Finally on 918, seizures broke. Follow-up EEG on 9/20 noted evidence of epileptic [name] from the left central temporal region.  Repeat EEG done on 9/22 noted epileptogenic city from the left central temporal region.  Pt with PEG placed on 02/05/20.    OT comments  Pt making minimal progress towards OT goals this session. Pt attempting to exit bed upon arrival. Assisted pt with sitting EOB safely with pt needing MINA to scoot hips to EOB. Pt able to sit EOB with close min guard for safety. Pt unable to follow commands or attend to task during session. Pt currently requires total A for UB ADL and was resistant to hand over hand assist to assist pt with brushing her hair. Pt makes no effort to make eye contact with therapist or respond purposefully to pts name being called. Pt would continue to benefit from skilled occupational therapy while admitted and after d/c to address the below listed limitations in order to improve overall functional mobility and facilitate independence with BADL participation. DC plan remains appropriate, will follow acutely per POC.     Follow Up Recommendations  SNF    Equipment Recommendations  3 in 1 bedside commode;Wheelchair (measurements OT);Wheelchair  cushion (measurements OT);Hospital bed    Recommendations for Other Services      Precautions / Restrictions Precautions Precautions: Fall Precaution Comments: Seizure, PEG abdominal binder Restrictions Weight Bearing Restrictions: No       Mobility Bed Mobility Overal bed mobility: Needs Assistance       Supine to sit: Min assist Sit to supine: Mod assist   General bed mobility comments: pt attempted to exit bed upon arrival with needing MINA to scoot hips to EOB to place feet on floor, pt required MOD A to return to bed only d/t resistance to assist  Transfers                 General transfer comment: deferred as +2 assist unavailable    Balance Overall balance assessment: Needs assistance Sitting-balance support: Single extremity supported;Feet supported Sitting balance-Leahy Scale: Fair Sitting balance - Comments: close supervision sitting EOB                                   ADL either performed or assessed with clinical judgement   ADL Overall ADL's : Needs assistance/impaired     Grooming: Brushing hair;Sitting;Total assistance Grooming Details (indicate cue type and reason): pt resistant to hand over hand assist therefore pt required total A for grooming tasks from EOB                   Toilet Transfer Details (indicate cue type and reason): deferred as pt +2 assist unavailable  Functional mobility during ADLs: Minimal assistance (bed mobility only) General ADL Comments: pt found attempting to exit bed, assist pt with safe transiton to EOB with MIN A for safety and to scoot hips to EOB, pt not following commands with pt needing total A at this time for ADLs     Vision   Additional Comments: unable to track therapist or make eye contact when therapists calls pts name   Perception     Praxis      Cognition Arousal/Alertness: Awake/alert Behavior During Therapy: Flat affect Overall Cognitive Status: Difficult to  assess Area of Impairment: Attention;Following commands;Safety/judgement;Problem solving;Awareness                   Current Attention Level: Focused   Following Commands: Follows one step commands inconsistently Safety/Judgement: Decreased awareness of safety;Decreased awareness of deficits (attempting to exit bed) Awareness: Intellectual Problem Solving: Slow processing;Requires verbal cues;Difficulty sequencing;Decreased initiation;Requires tactile cues General Comments: no verbalization or eye contact, no sustained attention to task or awareness to safety concerns with attempting to exit bed        Exercises     Shoulder Instructions       General Comments      Pertinent Vitals/ Pain       Pain Assessment: Faces Faces Pain Scale: No hurt  Home Living                                          Prior Functioning/Environment              Frequency  Min 2X/week        Progress Toward Goals  OT Goals(current goals can now be found in the care plan section)  Progress towards OT goals: Progressing toward goals  Acute Rehab OT Goals Patient Stated Goal: unable to state OT Goal Formulation: Patient unable to participate in goal setting Time For Goal Achievement: 03/09/20 Potential to Achieve Goals: Good  Plan Discharge plan remains appropriate;Frequency remains appropriate    Co-evaluation                 AM-PAC OT "6 Clicks" Daily Activity     Outcome Measure   Help from another person eating meals?: A Lot Help from another person taking care of personal grooming?: Total Help from another person toileting, which includes using toliet, bedpan, or urinal?: Total Help from another person bathing (including washing, rinsing, drying)?: Total Help from another person to put on and taking off regular upper body clothing?: Total Help from another person to put on and taking off regular lower body clothing?: Total 6 Click Score: 7     End of Session    OT Visit Diagnosis: Unsteadiness on feet (R26.81);Muscle weakness (generalized) (M62.81);Pain   Activity Tolerance Treatment limited secondary to medical complications (Comment);Other (comment) (not following commands)   Patient Left in bed;with call bell/phone within reach;with bed alarm set   Nurse Communication Mobility status        Time: 1448-1500 OT Time Calculation (min): 12 min  Charges: OT General Charges $OT Visit: 1 Visit OT Treatments $Self Care/Home Management : 8-22 mins  Audery Amel., COTA/L Acute Rehabilitation Services [phone]    Anita Davis 03/08/2020, 4:19 PM

## 2020-03-09 MED ORDER — OSMOLITE 1.5 CAL PO LIQD
355.0000 mL | Freq: Three times a day (TID) | ORAL | Status: DC
  Administered 2020-03-10 – 2020-03-28 (×54): 355 mL
  Filled 2020-03-09 (×11): qty 474

## 2020-03-09 MED ORDER — OSMOLITE 1.5 CAL PO LIQD
355.0000 mL | Freq: Three times a day (TID) | ORAL | Status: DC
  Filled 2020-03-09: qty 474

## 2020-03-09 NOTE — Progress Notes (Addendum)
[facility] PROGRESS NOTE  Anita Davis ZOX:[id number] DOB: 1986/04/15 DOA: 01/04/2020 PCP: Anita Davis, Shannon, PA-C     10/20 nonverbal and not making eye contact or following simple commands     10/25: Anita ChessmanMandy much more alert today.  Eventually she was able to talk and express her needs.  Subsequently she was rolled out to the nurses station to continue to improve her socialization.  She was requesting a Covid vaccine.   Status: Inpatient-Remains inpatient appropriate because:Altered mental status and Unsafe d/c plan Medicaid and disability applications have been completed and are pending.  Unfortunately patient continues to have no bed offers for SNF at this juncture.   Dispo: The patient is from: Home              Anticipated d/c is to: SNF              Anticipated d/c date is: > 3 days              Patient currently is medically stable to d/c.  Due to UNSAFE DC PLAN-patient needs placement in skilled nursing facility based on altered mentation and inability to manage self-care.  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 Do not expect recovery to previous baseline  Due to inconsistent alertness and thus inadequate oral intake  patient will continue G-tube for feedings and medications   Code Status: Full Family Communication: Updated Anita Davis at bedside on 11/16 DVT prophylaxis: Lovenox Vaccination status: Anita Davis confirmed pt given [employer]st Pfizer COVID vaccine in June. 2nd vaccine given 11/2  HPI: 34 y.o. F with hx substance use disorder, active, hepatitis C, and gestational diabetes who presented with confusion.  Patient initially admitted in September with encephalopathy, thought to be from drug overdose.  LP showed no signs of infection. MRI brain unremarkable. She remained encephalopathic, and EEG showed focal seizures.  The seizures were suppressed, and she  [facility], but after seizure control, she had poor neurologic recovery.  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  She deteriorated after steroids were stopped, so IVIG was started.  Since admission patient has had waxing and waning mentation ranging from completely unresponsive to quite alert, confused and verbal and able to express her needs.  Unfortunately these episodes of wakefulness are never sustained and patient will likely be struggling with abnormal mentation for a sustained period of time therefore long-term SNF recommended.  She is unable to eat enough by mouth therefore tube feedings continue per PEG  Subjective: Awake and once again restless.  Fidgeting in bed.  No definitive eye contact or attempts to verbalize.  Attempted to engage patient to see if by blinking her eyes she could communicate.  Unfortunately this was unsuccessful.  Nursing states they attempted to feed the patient but she left food in mouth and did not attempt to chew.  Objective: Vitals:   03/09/20 0300 03/09/20 0829  BP: 127/82 101/85  Pulse: 93 100  Resp: 16 18  Temp: 98 F (36.7 C) 98.2 F (36.8 C)  SpO2: 98% 95%   No intake or output data in the 24 hours ending 03/09/20 1121 Filed Weights   03/06/20 0427 03/08/20 0500 03/09/20 0300  Weight: 70.2 kg 71 kg 71.3 kg    Exam: Constitutional: Awake, nonverbal, fidgety  Respiratory: CTA bilaterally, Normal respiratory effort.-RA Cardiovascular: Regular rate  and rhythm. Adequate capillary refill.    Abdomen: non tender,G-tube in place with tube feeding.  Musculoskeletal: No contractures.  No tremors or spasticity  Neurologic: CN 2-12 grossly intact, observed spontaneous movement of extremities Psychiatric: Awake, eyes open.  No eye contact made.  No attempts to phonate.  Not following commands.  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  No definitive etiology determined.  Treated as autoimmune encephalitis with steroids and IVIG with no significant improvement -Follow-up EEG in the past 30 days without evidence of reemergence of seizures/status epilepticus -Continues with cyclic episodes of wakefulness versus minimally responsive and nonverbal state-apparently when alert this occurs more in the evening hours then daytime hours -11/15 discussed with neurologist/Anita Davis -discontinued Symmetrel in favor of Provigil.(if this medication needs to be continued upon discharge we will need to discuss with TOC regarding preauthorization) -No improvement in patients overall mentation with visit of family on 11/16.  Nursing also reports no further episodes of wakefulness that includes appropriate vocalization, attempts to eat an attempt to perform self-care.  Aunt Anita Davis asking (again) ECT were an option.  Discussed with psych and patient is not a candidate for inpatient ECT. -11/15 d/w psych upon the recommendation added Abilify to current regimen of Zoloft to treat depression  Diarrhea>>>Constipation -Multiple episodes on 11/12 therefore laxatives stopped -11/14 diarrhea resolved and patient with abdominal bloating.  X-ray revealed stool burden therefore bowel regimen resumed  Suspected oral Candida -Continue nystatin swish and swallow 4 times daily  Physical deconditioning -Working with PT and OT-previously had been motivated by food (chocolate) -Continues to require extensive assist for all ADLs with recommendation for SNF -Continue PT OT-he is able to ambulate with assistance but is very impulsive and requires a rolling walker and 100% supervision  Suspected chronic depression in context of ongoing polysubstance abuse prior to  admission -Waxing and waning mental status 2/2 neurological insult and severe depression  -11/8 psych increased Zoloft to 100 mg daily and Abilify was added on 11/15  Dysphagia (resolved) with persistent inadequate oral intake - SLP recommended regular diet with thin liquids but with waxing and waning alertness -continue intermittent nocturnal tube feedings given inconsistent intake of solid diet -Continue nocturnal tube feedings-Ensure boluses 2 times daily between meals  Nutrition Status/moderate to severe protein calorie malnutrition: Nutrition Problem: Inadequate oral intake Etiology: lethargy/confusion Signs/Symptoms: meal completion < 25% Interventions: Tube feeding, Prostat Estimated body mass index is 26.16 kg/m as calculated from the following:   Height as of this encounter: 5\' 5"  (1.651 m).   Weight as of this encounter: 71.3 kg.     Other problems: Dislodged gastrostomy tube -Placed by IR on 10/21  Nonobstructive transaminitis/hepatitis C -Elevated HCV antibody with markedly elevated HCV RNA quantitative level  consistent with chronic hepatitis C  -None total bilirubin normal with slightly elevated alkaline phosphatase with AST 165 and ALT 274 -11/10: Discussed with ID Dr. 11-15-2005.  Treatment of hepatitis C typically is initiated in the outpatient setting.  Medications can be quite expensive and usually take time to obtain insurance approval.  Also since patient likely go to skilled nursing facility expensive medication could be barrier to admission. -ID recommends scheduling outpatient appointment their clinic closer to discharge date  Data Reviewed: Basic Metabolic Panel: Recent Labs  Lab 03/03/20 0045  NA 139  K 4.1  CL 101  CO2 27  GLUCOSE  113*  BUN 16  CREATININE 0.53  CALCIUM 9.1   Liver Function Tests: Recent Labs  Lab 03/03/20 0045  AST 165*  ALT 274*  ALKPHOS 158*  BILITOT 0.6  PROT 6.8  ALBUMIN 3.5   No results for input(s): LIPASE,  AMYLASE in the last 168 hours. No results for input(s): AMMONIA in the last 168 hours. CBC: Recent Labs  Lab 03/03/20 0045  WBC 4.8  HGB 14.6  HCT 43.4  MCV 94.6  PLT 345   Cardiac Enzymes: No results for input(s): CKTOTAL, CKMB, CKMBINDEX, TROPONINI in the last 168 hours. BNP (last 3 results) No results for input(s): BNP in the last 8760 hours.  ProBNP (last 3 results) No results for input(s): PROBNP in the last 8760 hours.  CBG: No results for input(s): GLUCAP in the last 168 hours.  No results found for this or any previous visit (from the past 240 hour(s)).   Studies: No results found.  Scheduled Meds: . ARIPiprazole  2 mg Per Tube Daily  . Chlorhexidine Gluconate Cloth  6 each Topical Daily  . famotidine  20 mg Per Tube BID  . feeding supplement  237 mL Oral BID BM  . feeding supplement (PROSource TF)  45 mL Per Tube TID  . free water  200 mL Per Tube Q6H  . Gerhardt's butt cream  1 application Topical TID  . lacosamide  200 mg Per Tube BID  . modafinil  200 mg Oral Daily  . nystatin  5 mL Mouth/Throat QID  . PHENObarbital  60 mg Per Tube BID  . phenytoin  200 mg Per Tube BID  . polyethylene glycol  17 g Per Tube BID  . potassium chloride  40 mEq Per Tube Daily  . vitamin B-6  100 mg Per Tube Daily  . senna-docusate  1 tablet Per Tube BID  . sertraline  100 mg Per Tube Daily  . thiamine  100 mg Per Tube Daily   Continuous Infusions:   Principal Problem:   Refractory seizure (HCC) Active Problems:   Acute metabolic encephalopathy   Hypokalemia   Polysubstance abuse (HCC)   Elevated CK   Transaminitis   Chronic hepatitis C without hepatic coma (HCC)   Distended abdomen   Palliative care encounter   Colonic Ileus (HCC)   Organic brain syndrome   On enteral nutrition   Physical deconditioning   Protein-calorie malnutrition (HCC)   Inadequate oral nutritional intake   Consultants:  Neurology  Psychiatry  Interventional  radiology  Surgery  Procedures:  9/14 lumbar puncture  9/15 EEG  9/16 EEG  9/17 EEG  9/19 overnight EEG with video  9/22 overnight EEG with video with discontinuation of long-term EEG monitoring on 9/25  9/24 core track placement  10/6 EEG  Antibiotics: Anti-infectives (From admission, onward)   Start     Dose/Rate Route Frequency Ordered Stop   02/11/20 1526  ceFAZolin (ANCEF) IVPB 2g/100 mL premix        2 g 200 mL/hr over 30 Minutes Intravenous  Once 02/11/20 1526 02/11/20 1641   02/05/20 0600  ceFAZolin (ANCEF) IVPB 2g/100 mL premix        2 g 200 mL/hr over 30 Minutes Intravenous To Short Stay 02/04/20 1054 02/05/20 0950   01/29/20 0600  ceFAZolin (ANCEF) IVPB 2g/100 mL premix        2 g 200 mL/hr over 30 Minutes Intravenous On call to O.R. 01/28/20 1511 01/30/20 0559   01/28/20 2000  cefTRIAXone (ROCEPHIN) 1  g in sodium chloride 0.9 % 100 mL IVPB        1 g 200 mL/hr over 30 Minutes Intravenous Every 24 hours 01/28/20 1925 02/02/20 0724   01/06/20 0900  cefTRIAXone (ROCEPHIN) 2 g in sodium chloride 0.9 % 100 mL IVPB  Status:  Discontinued        2 g 200 mL/hr over 30 Minutes Intravenous Every 12 hours 01/06/20 0105 01/06/20 2202   01/06/20 0800  vancomycin (VANCOCIN) IVPB 1000 mg/200 mL premix  Status:  Discontinued       "Followed by" Linked Group Details   1,000 mg 200 mL/hr over 60 Minutes Intravenous Every 12 hours 01/05/20 1904 01/06/20 2202   01/05/20 2000  vancomycin (VANCOREADY) IVPB 1500 mg/300 mL       "Followed by" Linked Group Details   1,500 mg 150 mL/hr over 120 Minutes Intravenous  Once 01/05/20 1904 01/06/20 0127   01/05/20 1830  cefTRIAXone (ROCEPHIN) 2 g in sodium chloride 0.9 % 100 mL IVPB        2 g 200 mL/hr over 30 Minutes Intravenous  Once 01/05/20 1829 01/05/20 2220       Time spent: 20 minutes    [name] ANP  [facility] Pager [phone]. If 7PM-7AM, please contact night-coverage at [email or web address] 03/09/2020, 11:21  AM  LOS: 63 days

## 2020-03-09 NOTE — Progress Notes (Signed)
Nutrition Follow-up  DOCUMENTATION CODES:   Not applicable  INTERVENTION:   -Transition to scheduled bolus feedings of 1.5 cartons ( ) of Osmolite 1.5 cal via PEG TID and flush with 10ml free water before and after each flush.  -Continue 87ml Prosource TF TID via PEG.  -Continue to Assist pt with all meals and encourage PO intake.  -Continue Ensure Enlive po BID, each supplement provides 350 kcal and 20 grams of protein.   Each 1.5 carton ( ) bolus will provide 532 kcals, 22 grams protein, and free water.   NUTRITION DIAGNOSIS:   Inadequate oral intake related to lethargy/confusion as evidenced by meal completion < 25%.  Ongoing   GOAL:   Patient will meet greater than or equal to 90% of their needs  Ongoing   MONITOR:   TF tolerance, Labs, Weight trends, I & O's  REASON FOR ASSESSMENT:   Consult Enteral/tube feeding initiation and management  ASSESSMENT:   Pt with refractory seizures with acute metabolic encephalopathy. PMH includes hepatitis and polysubstance abuse.  Pt was nonverbal during visit. Pt's appetite appears to be decreased since last RD assessment. Pt last meal record was 11/10, which completion reports appear to be varied when reports were completed. Discussed pt with RN who reports pt has had nothing by mouth today. RN reports pt was agitated today and refusing any oral intake. RN reports completing bolus feedings TID of 1.5 cartons Osmolite 1.5 via PEG when po intake is <50% and prosource TID.  Pt has PRN orders to provide her with 1.5 cartons ( ) of Osmolite 1.5 kcal via PEG with 60 ml free water before and after each flush. Each 1.5 bolus carton will provide 532 kcals, 22 grams of protein, and 391 mL free water. Pt also with orders for Ensure Enlive po BID and 55ml Prosource TF TID.  D/t decreased oral intake, switch from PRN to scheduled bolus feedings of 1.5 cartons of Osmolite 1.5 TID.  1.5 cartons of Osmolite 1.5 TID and prosource  TID would provide 1720 kcal, 100 grams of protein, and 1173 mL of free water.   This regimen would meet 96% of patients calorie needs and 100% of protein needs.   Labs reviewed.   Medications reviewed an  Diet Order:   Diet Order            Diet regular Room service appropriate? Yes; Fluid consistency: Thin  Diet effective now                 EDUCATION NEEDS:   No education needs have been identified at this time  Skin:  Skin Assessment: Reviewed RN Assessment Skin Integrity Issues:: Incisions Incisions: abdomen  Last BM:  03/01/20 type 4  Height:   Ht Readings from Last 1 Encounters:  01/06/20 5\' 5"  (1.651 m)    Weight:   Wt Readings from Last 1 Encounters:  03/09/20 71.3 kg     BMI:  Body mass index is 26.16 kg/m.  Estimated Nutritional Needs:   Kcal:  1800-2000  Protein:  90-100 grams  Fluid:  >/=1.8L/d   03/11/20, Dietetic Intern Pager: [phone] If unavailable: [phone]

## 2020-03-10 ENCOUNTER — Inpatient Hospital Stay (HOSPITAL_COMMUNITY): Payer: Medicaid Other

## 2020-03-10 LAB — BASIC METABOLIC PANEL
Anion gap: 14 (ref 5–15)
BUN: 17 mg/dL (ref 6–20)
CO2: 25 mmol/L (ref 22–32)
Calcium: 9.6 mg/dL (ref 8.9–10.3)
Chloride: 101 mmol/L (ref 98–111)
Creatinine, Ser: 0.6 mg/dL (ref 0.44–1.00)
GFR, Estimated: >60 mL/min (ref >60)
Glucose, Bld: 96 mg/dL (ref 70–99)
Potassium: 3.9 mmol/L (ref 3.5–5.1)
Sodium: 140 mmol/L (ref 135–145)

## 2020-03-10 MED ORDER — DOCUSATE SODIUM 50 MG/5ML PO LIQD: 100.0000 mg | Freq: Two times a day (BID) | ORAL | Status: DC | PRN

## 2020-03-10 NOTE — Progress Notes (Signed)
[facility] PROGRESS NOTE  Amariana Mirando EGB:[id number] DOB: October 22, 1985 DOA: 01/04/2020 PCP: [name], PA-C     10/20 nonverbal and not making eye contact or following simple commands     10/25: Anita Davis much more alert today.  Eventually she was able to talk and express her needs.  Subsequently she was rolled out to the nurses station to continue to improve her socialization.  She was requesting a Covid vaccine.   Status: Inpatient-Remains inpatient appropriate because:Altered mental status and Unsafe d/c plan Medicaid and disability applications have been completed and are pending.  Unfortunately patient continues to have no bed offers for SNF at this juncture.   Dispo: The patient is from: Home              Anticipated d/c is to: SNF              Anticipated d/c date is: > 3 days              Patient currently is medically stable to d/c.  Due to UNSAFE DC PLAN-patient needs placement in skilled nursing facility based on altered mentation and inability to manage self-care.  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 Do not expect recovery to previous baseline  Due to inconsistent alertness and thus inadequate oral intake  patient will continue G-tube for feedings and medications   Code Status: Full Family Communication: Updated [name] "[address]" [name] at bedside on 11/16 DVT prophylaxis: Lovenox Vaccination status: [name] confirmed pt given [employer] COVID vaccine in June. 2nd vaccine given 11/2  HPI: 34 y.o. F with hx substance use disorder, active, hepatitis C, and gestational diabetes who presented with confusion.  Patient initially admitted in September with encephalopathy, thought to be from drug overdose.  LP showed no signs of infection. MRI brain unremarkable. She remained encephalopathic, and EEG showed focal seizures.  The seizures were suppressed, and she  was transferred to [facility] for continuous EEG, but after seizure control, she had poor neurologic recovery.  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  She deteriorated after steroids were stopped, so IVIG was started.  Since admission patient has had waxing and waning mentation ranging from completely unresponsive to quite alert, confused and verbal and able to express her needs.  Unfortunately these episodes of wakefulness are never sustained and patient will likely be struggling with abnormal mentation for a sustained period of time therefore long-term SNF recommended.  She is unable to eat enough by mouth therefore tube feedings continue per PEG  Subjective: Awake and and restless in bed.  Does not make eye contact when spoken to and again does not respond verbally or follow simple commands.  Fixed gaze to the right and using right hand to push against bedside.  May be having hallucinations.  Objective: Vitals:   03/10/20 0358 03/10/20 0846  BP: 120/79 109/88  Pulse: 63 94  Resp: 18 20  Temp: 98.2 F (36.8 C) 98.1 F (36.7 C)  SpO2: 100% 96%   No intake or output data in the 24 hours ending 03/10/20 1120 Filed Weights   03/06/20 0427 03/08/20 0500 03/09/20 0300  Weight: 70.2 kg 71 kg 71.3 kg    Exam: Constitutional: Awake, nonverbal, fidgety  Respiratory: CTA bilaterally anteriorly, Normal respiratory effort.-RA Cardiovascular: Regular rate and rhythm. Adequate capillary refill.    Abdomen: non tender,G-tube in place with  tube feeding.  Musculoskeletal: No tremors or spasticity  Neurologic: CN 2-12 grossly intact, observed spontaneous movement of extremities, bilateral strength 5/5 Psychiatric: Awake, eyes open.  No eye contact made.  No attempts to phonate.  Not following commands.  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  No definitive etiology determined.  Treated as autoimmune encephalitis with steroids and IVIG with no significant improvement -Follow-up EEG in the past 30 days without evidence of reemergence of seizures/status epilepticus -Continues with cyclic episodes of appropriate wakefulness vs minimally responsive and nonverbal state- appropriate wakefulness more in the evening hours then daytime hours but again no definitive pattern otherwise has emerged -11/15 discussed with neurologist/Yadav -discontinued Symmetrel in favor of Provigil.(if this medication needs to be continued upon discharge we will need to discuss with [facility] regarding preauthorization) -Patient's aunt [facility] asking (again) if ECT an option.  Discussed with psych and patient is not a candidate for inpatient ECT. -11/15 d/w psych upon the recommendation added Abilify to current regimen of Zoloft to treat depression  Diarrhea>>>Constipation Resolved -Multiple episodes on 11/12 therefore laxatives stopped; 11/14 diarrhea resolved but with abdominal bloating so abdominal x-ray pain that revealed continued left colonic stool burden so laxatives were resumed.  On the afternoon of 11/17 patient had significant diarrhea again therefore laxatives once again stopped.  Follow-up abdominal films on 11/18 showed no evidence of stool burden therefore laxatives changed to as needed only  Suspected oral Candida -Continue nystatin swish and swallow 4 times daily  Physical deconditioning -Working with PT and OT; inconsistent wakefulness setting ability to participate consistently -Continues to require extensive assist for all ADLs with recommendation for SNF  Suspected chronic depression in context of ongoing polysubstance abuse prior to admission -Waxing and waning mental status 2/2  neurological insult and severe depression  -11/8 psych increased Zoloft to 100 mg daily and Abilify was added on 11/15  Dysphagia (resolved) with persistent inadequate oral intake - SLP recommended regular diet with thin liquids but with waxing and waning alertness inconsistent oral intake  -11/18 nutrition recommended transitioning to bolus tube feedings -Continue nocturnal tube feedings-Ensure boluses 2 times daily between meals  Nutrition Status/moderate to severe protein calorie malnutrition: Nutrition Status: Nutrition Problem: Inadequate oral intake Etiology: lethargy/confusion Signs/Symptoms: meal completion < 25% Interventions: Tube feeding, Prostat Estimated body mass index is 26.16 kg/m as calculated from the following:   Height as of this encounter: 5\' 5"  (1.651 m).   Weight as of this encounter: 71.3 kg.     Other problems: Dislodged gastrostomy tube -Placed by IR on 10/21  Nonobstructive transaminitis/hepatitis C -Elevated HCV antibody with markedly elevated HCV RNA quantitative level  consistent with chronic hepatitis C  -None total bilirubin normal with slightly elevated alkaline phosphatase with AST 165 and ALT 274 -11/10: Discussed with ID Dr. Manson PasseyMandahar.  Treatment of hepatitis C typically is initiated in the outpatient setting.  Medications can be quite expensive and usually take time to obtain insurance approval.  Also since patient likely go to skilled nursing facility expensive medication could be barrier to admission. -ID recommends scheduling outpatient appointment their clinic closer to discharge date  Data Reviewed: Basic Metabolic Panel: Recent Labs  Lab 03/10/20 0808  NA 140  K 3.9  CL 101  CO2 25  GLUCOSE 96  BUN 17  CREATININE 0.60  CALCIUM 9.6  Liver Function Tests: No results for input(s): AST, ALT, ALKPHOS, BILITOT, PROT, ALBUMIN in the last 168 hours. No results for input(s): LIPASE, AMYLASE in the last 168 hours. No results for  input(s): AMMONIA in the last 168 hours. CBC: No results for input(s): WBC, NEUTROABS, HGB, HCT, MCV, PLT in the last 168 hours. Cardiac Enzymes: No results for input(s): CKTOTAL, CKMB, CKMBINDEX, TROPONINI in the last 168 hours. BNP (last 3 results) No results for input(s): BNP in the last 8760 hours.  ProBNP (last 3 results) No results for input(s): PROBNP in the last 8760 hours.  CBG: No results for input(s): GLUCAP in the last 168 hours.  No results found for this or any previous visit (from the past 240 hour(s)).   Studies: DG Abd 1 View  Result Date: 03/10/2020 CLINICAL DATA:  Constipation, follow-up EXAM: ABDOMEN - 1 VIEW COMPARISON:  03/06/2020 FINDINGS: Gastrostomy tube is again noted. No significant stool burden. No dilated loops of bowel on this single view. IMPRESSION: No significant stool burden. Electronically Signed   By: Guadlupe Spanish M.D.   On: 03/10/2020 08:50    Scheduled Meds: . ARIPiprazole  2 mg Per Tube Daily  . Chlorhexidine Gluconate Cloth  6 each Topical Daily  . famotidine  20 mg Per Tube BID  . feeding supplement  237 mL Oral BID BM  . feeding supplement (OSMOLITE 1.5 CAL)  355 mL Per Tube TID  . feeding supplement (PROSource TF)  45 mL Per Tube TID  . free water  200 mL Per Tube Q6H  . Gerhardt's butt cream  1 application Topical TID  . lacosamide  200 mg Per Tube BID  . modafinil  200 mg Oral Daily  . nystatin  5 mL Mouth/Throat QID  . PHENObarbital  60 mg Per Tube BID  . phenytoin  200 mg Per Tube BID  . potassium chloride  40 mEq Per Tube Daily  . vitamin B-6  100 mg Per Tube Daily  . sertraline  100 mg Per Tube Daily  . thiamine  100 mg Per Tube Daily   Continuous Infusions:   Principal Problem:   Refractory seizure (HCC) Active Problems:   Acute metabolic encephalopathy   Hypokalemia   Polysubstance abuse (HCC)   Elevated CK   Transaminitis   Chronic hepatitis C without hepatic coma (HCC)   Distended abdomen   Palliative care  encounter   Colonic Ileus (HCC)   Organic brain syndrome   On enteral nutrition   Physical deconditioning   Protein-calorie malnutrition (HCC)   Inadequate oral nutritional intake   Consultants:  Neurology  Psychiatry  Interventional radiology  Surgery  Procedures:  9/14 lumbar puncture  9/15 EEG  9/16 EEG  9/17 EEG  9/19 overnight EEG with video  9/22 overnight EEG with video with discontinuation of long-term EEG monitoring on 9/25  9/24 core track placement  10/6 EEG  Antibiotics: Anti-infectives (From admission, onward)   Start     Dose/Rate Route Frequency Ordered Stop   02/11/20 1526  ceFAZolin (ANCEF) IVPB 2g/100 mL premix        2 g 200 mL/hr over 30 Minutes Intravenous  Once 02/11/20 1526 02/11/20 1641   02/05/20 0600  ceFAZolin (ANCEF) IVPB 2g/100 mL premix        2 g 200 mL/hr over 30 Minutes Intravenous To Short Stay 02/04/20 1054 02/05/20 0950   01/29/20 0600  ceFAZolin (ANCEF) IVPB 2g/100 mL premix        2 g 200  mL/hr over 30 Minutes Intravenous On call to O.R. 01/28/20 1511 01/30/20 0559   01/28/20 2000  cefTRIAXone (ROCEPHIN) 1 g in sodium chloride 0.9 % 100 mL IVPB        1 g 200 mL/hr over 30 Minutes Intravenous Every 24 hours 01/28/20 1925 02/02/20 0724   01/06/20 0900  cefTRIAXone (ROCEPHIN) 2 g in sodium chloride 0.9 % 100 mL IVPB  Status:  Discontinued        2 g 200 mL/hr over 30 Minutes Intravenous Every 12 hours 01/06/20 0105 01/06/20 2202   01/06/20 0800  vancomycin (VANCOCIN) IVPB 1000 mg/200 mL premix  Status:  Discontinued       "Followed by" Linked Group Details   1,000 mg 200 mL/hr over 60 Minutes Intravenous Every 12 hours 01/05/20 1904 01/06/20 2202   01/05/20 2000  vancomycin (VANCOREADY) IVPB 1500 mg/300 mL       "Followed by" Linked Group Details   1,500 mg 150 mL/hr over 120 Minutes Intravenous  Once 01/05/20 1904 01/06/20 0127   01/05/20 1830  cefTRIAXone (ROCEPHIN) 2 g in sodium chloride 0.9 % 100 mL IVPB        2  g 200 mL/hr over 30 Minutes Intravenous  Once 01/05/20 1829 01/05/20 2220       Time spent: 20 minutes    [name] ANP  [facility] Pager [phone]. If 7PM-7AM, please contact night-coverage at [email or web address] 03/10/2020, 11:20 AM  LOS: 64 days

## 2020-03-10 NOTE — Progress Notes (Signed)
03/10/20 1948  What Happened  Was fall witnessed? No  Was patient injured? No  Patient found on floor  Found by Staff-comment  Follow Up  MD notified T Opyd  Time MD notified Weed notified Yes - comment (unable to reach)  Time family notified 2011  Adult Fall Risk Assessment  Risk Factor Category (scoring not indicated) High fall risk per protocol (document High fall risk)  Age 34  Fall History: Fall within 6 months prior to admission 0  Elimination; Bowel and/or Urine Incontinence 2  Elimination; Bowel and/or Urine Urgency/Frequency 0  Medications: includes PCA/Opiates, Anti-convulsants, Anti-hypertensives, Diuretics, Hypnotics, Laxatives, Sedatives, and Psychotropics 5  Patient Care Equipment 1  Mobility-Assistance 2  Mobility-Gait 2  Mobility-Sensory Deficit 0  Altered awareness of immediate physical environment 1  Impulsiveness 2  Lack of understanding of one's physical/cognitive limitations 4  Total Score 19  Patient Fall Risk Level High fall risk  Adult Fall Risk Interventions  Required Bundle Interventions *See Row Information* High fall risk - low, moderate, and high requirements implemented  Additional Interventions Use of appropriate toileting equipment (bedpan, BSC, etc.)  Screening for Fall Injury Risk (To be completed on HIGH fall risk patients) - Assessing Need for Low Bed  Risk For Fall Injury- Low Bed Criteria Previous fall this admission  Will Implement Low Bed and Floor Mats No - Criteria no longer met for low bed  Specialty Low Bed Contraindicated Requires therapeutic low air loss mattress  Screening for Fall Injury Risk (To be completed on HIGH fall risk patients who do not meet crieteria for Low Bed) - Assessing Need for Floor Mats Only  Risk For Fall Injury- Criteria for Floor Mats None identified - No additional interventions needed  Will Implement Floor Mats Yes  Vitals  Temp 98.7 F (37.1 C)  Temp Source Oral  BP (!) 127/98  MAP (mmHg) 109   BP Location Right Arm  BP Method Automatic  Patient Position (if appropriate) Lying  Pulse Rate 90  Pulse Rate Source Dinamap  Resp 18  Oxygen Therapy  SpO2 96 %  O2 Device Room Air  PAINAD (Pain Assessment in Advanced Dementia)  Breathing 0  Negative Vocalization 0  Facial Expression 0  Body Language 0  Consolability 0  PAINAD Score 0  PCA/Epidural/Spinal Assessment  Respiratory Pattern Regular;Unlabored  Neurological  Neuro (WDL) X  Level of Consciousness Alert  Orientation Level Other (comment) (nonverbal)  Cognition Poor attention/concentration;Poor judgement;Poor safety awareness  Speech Mute  Pupil Assessment  Yes  R Pupil Size (mm) 3  R Pupil Shape Round  R Pupil Reaction Brisk  L Pupil Size (mm) 3  L Pupil Shape Round  L Pupil Reaction Brisk  Motor Function/Sensation Assessment Grip;Motor response;Motor strength;Sensation  R Hand Grip Weak  L Hand Grip Weak   Right Pronator Drift Absent  Left Pronator Drift Absent  R Foot Dorsiflexion Weak  L Foot Dorsiflexion Weak  R Foot Plantar Flexion Weak  L Foot Plantar Flexion Weak  RUE Motor Response Non-purposeful movment  RUE Sensation Other (Comment) (UTA)  RUE Motor Strength 4  LUE Motor Response Non-purposeful movement  LUE Sensation Other (Comment) (UTA)  LUE Motor Strength 4  RLE Motor Response Non-purposeful movement  RLE Sensation Other (Comment) (UTA)  RLE Motor Strength 4  LLE Motor Response Non-purposeful movement  LLE Sensation Other (Comment) (UTA)  LLE Motor Strength 4  Neuro Symptoms None  Glasgow Coma Scale  Eye Opening 4  Best Verbal Response (NON-intubated)  1  Best Motor Response 4  Glasgow Coma Scale Score 9  Musculoskeletal  Musculoskeletal (WDL) X  Assistive Device BSC  Generalized Weakness Yes  Weight Bearing Restrictions No  Integumentary  Integumentary (WDL) X  Skin Color Appropriate for ethnicity  Skin Condition Dry  Catheter Entry/Exit Location Abdomen  Catheter  Entry/Exit Intervention Gauze  Pt placed back in bed by staff, bed alarm on, side rails in place. Will continue to monitor.

## 2020-03-11 MED ORDER — MODAFINIL 100 MG PO TABS
100.0000 mg | ORAL_TABLET | Freq: Every day | ORAL | Status: DC
  Administered 2020-03-13: 100 mg via ORAL
  Filled 2020-03-11 (×2): qty 1

## 2020-03-11 NOTE — Progress Notes (Signed)
Physical Therapy Treatment [name] MRN: [id number] DOB: [birth year] Today's Date: 03/11/2020    History of Present Illness 34 year old with past medical history significant for hepatitis C, polysubstance abuse brought to the hospital 9/13 for disorientation which was suspected to be substance abuse.  After she became more lucid, she was discharged, but then police brought her back later in the day and they found her passed out in a parking lot.  Urine drug screen was positive for benzodiazepine and THC. An EEG done on 9/15 showed patient was in status epilepticus.  Patient was a started on Keppra.  Despite these, EEG noted continued seizures requiring additional medications as well.  Finally on 9/18, seizures broke. Follow-up EEG on 9/20 noted evidence of epilepticity from the left central temporal region.  Repeat EEG done on 9/22 noted epileptogenicity from the left central temporal region.  Pt with PEG placed on 02/05/20.     PT Comments    Pt continues to demonstrate limited ability to actively participate in PT sessions. Pt is unable to follow commands and continues to require physical initiation for all mobility tasks. Pt demonstrates significant extensor tone this session in both upper and lower extremities, difficult for PT to break to safely obtain a sitting position after standing. Pt will benefit from continued PT attempts to improve mobility, however to this point the pt demonstrates very little progress. If the pt continues to demonstrate a limited ability to participate in skilled PT intervention and limited progress then PT will sign off.   Follow Up Recommendations  SNF;Supervision/Assistance - 24 hour     Equipment Recommendations  Wheelchair (measurements PT);Wheelchair cushion (measurements PT);Hospital bed;Other (comment);3in1 (PT)    Recommendations for Other Services       Precautions / Restrictions Precautions Precautions: Fall Precaution  Comments: Seizure, PEG abdominal binder Restrictions Weight Bearing Restrictions: No    Mobility  Bed Mobility Overal bed mobility: Needs Assistance Bed Mobility: Supine to Sit;Sit to Supine Rolling: Total assist   Supine to sit: Total assist;HOB elevated Sit to supine: Total assist;HOB elevated   General bed mobility comments: Pt with writhing type movements but non-urposeful. Hand placed at head to attempt washing face. Pt with firm pressure toward her face, pushing very hard therefore her hand was physically moved off of her face. Feet placed off bed and pt did appear to attempt to move toward EOB. Once EOB, pt pushing posteriorly adn required Max A to keep from sliding off bed, ulling BLE under bed. Attempted to block knees adn position feet however pt leaning posteriorly.  Transfers Overall transfer level: Needs assistance Equipment used: 1 person hand held assist Transfers: Sit to/from [employer] Sit to Stand: Max assist Stand pivot transfers: Max assist       General transfer comment: pt does provide some power through LEs in sit to stand but requires totalA to initiate all transfer attempts. Once standing pt requires modA for balance and physical assistance to weight shift and pivot  Ambulation/Gait                 Stairs             Wheelchair Mobility    Modified Rankin (Stroke Patients Only)       Balance Overall balance assessment: Needs assistance Sitting-balance support: Single extremity supported;Feet supported Sitting balance-Leahy Scale: Poor Sitting balance - Comments: minA at beginning of session, mod-max at end of session due to extensor tone Postural control: Posterior lean Standing  balance support: Bilateral upper extremity supported Standing balance-Leahy Scale: Zero Standing balance comment: mod-maxA, pt sways in all direction but primarily with posterior lean due to increased extensor tone                             Cognition Arousal/Alertness: Awake/alert Behavior During Therapy: Restless Overall Cognitive Status: Difficult to assess                                 General Comments: pt does not follow commands, demonstrates poor awareness of deficits and safety, inattentive during session to PT      Exercises      General Comments General comments (skin integrity, edema, etc.): pt with significant extensor tone during functional mobility, difficult for PT to break at times when attempting to transition from standing to sitting      Pertinent Vitals/Pain Pain Assessment: Faces Faces Pain Scale: Hurts even more Pain Location: generalized Pain Descriptors / Indicators: Grimacing Pain Intervention(s): Monitored during session    Home Living                      Prior Function            PT Goals (current goals can now be found in the care plan section) Acute Rehab PT Goals Patient Stated Goal: pt unable to state Progress towards PT goals: Not progressing toward goals - comment (does not follow commands)    Frequency    Min 2X/week      PT Plan Current plan remains appropriate    Co-evaluation              AM-PAC PT "6 Clicks" Mobility   Outcome Measure  Help needed turning from your back to your side while in a flat bed without using bedrails?: Total Help needed moving from lying on your back to sitting on the side of a flat bed without using bedrails?: Total Help needed moving to and from a bed to a chair (including a wheelchair)?: Total Help needed standing up from a chair using your arms (e.g., wheelchair or bedside chair)?: Total Help needed to walk in hospital room?: Total Help needed climbing 3-5 steps with a railing? : Total 6 Click Score: 6    End of Session   Activity Tolerance: Patient tolerated treatment well Patient left: in bed;with call bell/phone within reach;with bed alarm set Nurse Communication: Mobility  status;Need for lift equipment PT Visit Diagnosis: Unsteadiness on feet (R26.81);Muscle weakness (generalized) (M62.81);Difficulty in walking, not elsewhere classified (R26.2);Other symptoms and signs involving the nervous system (R29.898)     Time: 2334-3568 PT Time Calculation (min) (ACUTE ONLY): 19 min  Charges:  $Therapeutic Activity: 8-22 mins                     Arlyss Gandy, PT, DPT Acute Rehabilitation Pager: [phone]    Arlyss Gandy 03/11/2020, 3:10 PM

## 2020-03-11 NOTE — Progress Notes (Signed)
Occupational Therapy Treatment [name] MRN: [id number] DOB: 05/05/1985 Today's Date: 03/11/2020    History of present illness 34 year old with past medical history significant for hepatitis C, polysubstance abuse brought to the hospital 9/13 for disorientation which was suspected to be substance abuse.  After she became more lucid, she was discharged, but then police brought her back later in the day and they found her passed out in a parking lot.  Urine drug screen was positive for benzodiazepine and THC. An EEG done on 9/15 showed patient was in status epilepticus.  Patient was a started on Keppra.  Despite these, EEG noted continued seizures requiring additional medications as well.  Finally on 9/18, seizures broke. Follow-up EEG on 9/20 noted evidence of epilepticity from the left central temporal region.  Repeat EEG done on 9/22 noted epileptogenicity from the left central temporal region.  Pt with PEG placed on 02/05/20.    OT comments  Pt with writhing movements in bed. Not follwoing commands. Total A with ADL tasks today. Attempted to transfer t OOB with assistance of nursing however pt with increased posterior pushing with uncontrolled movements. Required Max A to maintain sitting balance EOB. Transitioned back to supine and positioned in chair position in bed. Attempted self feeding however pt did not bring any food source to mouth. Attempted to have pt drink from a straw however pt would not close lips around straw. Nsg present and aware of today's status.  Goals updated. Will continue to follow acutely.   Follow Up Recommendations  Supervision/Assistance - 24 hour;SNF    Equipment Recommendations  3 in 1 bedside commode;Wheelchair (measurements OT);Wheelchair cushion (measurements OT);Hospital bed    Recommendations for Other Services      Precautions / Restrictions Precautions Precautions: Fall Precaution Comments: Seizure, PEG abdominal  binder Restrictions Weight Bearing Restrictions: No       Mobility Bed Mobility Overal bed mobility: Needs Assistance Bed Mobility: Supine to Sit;Sit to Supine Rolling: Total assist   Supine to sit: Max assist;+2 for physical assistance Sit to supine: Max assist;+2 for physical assistance   General bed mobility comments: Pt with writhing type movements but non-urposeful. Hand placed at head to attempt washing face. Pt with firm pressure toward her face, pushing very hard therefore her hand was physically moved off of her face. Feet placed off bed and pt did appear to attempt to move toward EOB. Once EOB, pt pushing posteriorly adn required Max A to keep from sliding off bed, ulling BLE under bed. Attempted to block knees and position feet however pt leaning posteriorly.  Transfers                 General transfer comment: attempted however unable to safely complete. Would need Maximove today    Balance     Sitting balance-Leahy Scale: Zero                                     ADL either performed or assessed with clinical judgement   ADL   Eating/Feeding: Total assistance Eating/Feeding Details (indicate cue type and reason): attempted to work on self feedin; pt held drink but did not bring to mouth; straw placed in mouth but pt did not have li closure around straw or attmept to suck  General ADL Comments: total A     Vision       Perception     Praxis      Cognition Arousal/Alertness: Awake/alert Behavior During Therapy: Restless;Flat affect Overall Cognitive Status: Impaired/Different from baseline                                 General Comments: Not following commands; appeared to track as I walked around her bed        Exercises     Shoulder Instructions       General Comments      Pertinent Vitals/ Pain       Pain Assessment: Faces Faces Pain Scale: Hurts a little  bit Pain Location: generalized Pain Descriptors / Indicators: Grimacing Pain Intervention(s): Limited activity within patient's tolerance  Home Living                                          Prior Functioning/Environment              Frequency  Min 2X/week        Progress Toward Goals  OT Goals(current goals can now be found in the care plan section)  Progress towards OT goals: Not progressing toward goals - comment (goals updated)  Acute Rehab OT Goals Patient Stated Goal: pt unable to state OT Goal Formulation: Patient unable to participate in goal setting Time For Goal Achievement: 03/25/20 Potential to Achieve Goals: Fair  Plan Discharge plan remains appropriate    Co-evaluation                 AM-PAC OT "6 Clicks" Daily Activity     Outcome Measure   Help from another person eating meals?: Total Help from another person taking care of personal grooming?: Total Help from another person toileting, which includes using toliet, bedpan, or urinal?: Total Help from another person bathing (including washing, rinsing, drying)?: Total Help from another person to put on and taking off regular upper body clothing?: Total Help from another person to put on and taking off regular lower body clothing?: Total 6 Click Score: 6    End of Session Equipment Utilized During Treatment: Gait belt  OT Visit Diagnosis: Unsteadiness on feet (R26.81);Muscle weakness (generalized) (M62.81);Pain;Other symptoms and signs involving the nervous system (R29.898);Other symptoms and signs involving cognitive function;Cognitive communication deficit (R41.841);Ataxia, unspecified (R27.0);Feeding difficulties (R63.3);Other abnormalities of gait and mobility (R26.89) Pain - part of body:  (generalized)   Activity Tolerance Patient tolerated treatment well   Patient Left in bed;with call bell/phone within reach;with bed alarm set   Nurse Communication Mobility  status        Time: 1040-1120 OT Time Calculation (min): 40 min  Charges: OT General Charges $OT Visit: 1 Visit OT Treatments $Self Care/Home Management : 8-22 mins $Neuromuscular Re-education: 23-37 mins  Luisa Dago, OT/L   Acute OT Clinical Specialist Acute Rehabilitation Services Pager [phone] Office [phone]    [facility] 03/11/2020, 12:24 PM

## 2020-03-11 NOTE — Progress Notes (Addendum)
[facility] PROGRESS NOTE  Anita Davis YIR:[id number] DOB: [birth year] DOA: 01/04/2020 PCP: [name], PA-C     10/20 nonverbal and not making eye contact or following simple commands     10/25: Anita Davis much more alert today.  Eventually she was able to talk and express her needs.  Subsequently she was rolled out to the nurses station to continue to improve her socialization.  She was requesting a Covid vaccine.   Status: Inpatient-Remains inpatient appropriate because:Altered mental status and Unsafe d/c plan Medicaid and disability applications have been completed and are pending.  Unfortunately patient continues to have no bed offers for SNF at this juncture.   Dispo: The patient is from: Home              Anticipated d/c is to: SNF              Anticipated d/c date is: > 3 days              Patient currently is medically stable to d/c.  Due to UNSAFE DC PLAN-patient needs placement in skilled nursing facility based on altered mentation and inability to manage self-care.  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 Do not expect recovery to previous baseline  Due to inconsistent alertness and thus inadequate oral intake  patient will continue G-tube for feedings and medications   Code Status: Full Family Communication: Updated Anita Davis "[address]" [place] and patient's stepmother on 11/19 (telephone conversation greater than 30 minutes) DVT prophylaxis: Lovenox Vaccination status: Anita Davis confirmed pt given [employer] COVID vaccine in June. 2nd vaccine given 11/2  HPI: 34 y.o. F with hx substance use disorder, active, hepatitis C, and gestational diabetes who presented with confusion.  Patient initially admitted in September with encephalopathy, thought to be from drug overdose.  LP showed no signs of infection. MRI brain unremarkable. She remained encephalopathic, and EEG  showed focal seizures.  The seizures were suppressed, and she was transferred to [facility] for continuous EEG, but after seizure control, she had poor neurologic recovery.  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  She deteriorated after steroids were stopped, so IVIG was started.  Since admission patient has had waxing and waning mentation ranging from completely unresponsive to quite alert, confused and verbal and able to express her needs.  Unfortunately these episodes of wakefulness are never sustained and patient will likely be struggling with abnormal mentation for a sustained period of time therefore long-term SNF recommended.  She is unable to eat enough by mouth therefore tube feedings continue per PEG  Subjective: Remains verbally nonresponsive.  Eyes open but does not track or follow simple commands.  3 restless in bed.  Objective: Vitals:   03/11/20 0353 03/11/20 0810  BP: 110/75 (!) 128/95  Pulse: 64 89  Resp: 15 16  Temp: (!) 97.4 F (36.3 C) 97.9 F (36.6 C)  SpO2: 96%    No intake or output data in the 24 hours ending 03/11/20 1129 Filed Weights   03/06/20 0427 03/08/20 0500 03/09/20 0300  Weight: 70.2 kg 71 kg 71.3 kg    Exam: Constitutional: Awake, nonverbal, restless Respiratory: CTA bilaterally anteriorly, Normal respiratory effort.-RA Cardiovascular: Regular rate and rhythm. Adequate capillary refill.    Abdomen: non tender,G-tube in place with bolus tube feeding.  Musculoskeletal: No tremors or spasticity  Neurologic: CN 2-12 grossly intact, observed spontaneous  movement of extremities, bilateral strength 5/5 Psychiatric: Awake, eyes open.  No eye contact made.  No attempts to phonate.  Not following commands.  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  No definitive etiology determined.  Treated as autoimmune encephalitis with steroids and IVIG with no significant improvement -Follow-up EEG in the past 30 days without evidence of reemergence of seizures/status epilepticus -Continues with cyclic episodes of appropriate wakefulness vs minimally responsive and nonverbal state- appropriate wakefulness more in the evening hours then daytime hours but again no definitive pattern otherwise has emerged -11/15 discussed with neurologist/Yadav -discontinued Symmetrel in favor of Provigil.(if this medication needs to be continued upon discharge we will need to discuss with TOC regarding preauthorization)-11/19 given writhing behaviors will decrease Provigil from 200 to 100 md daily -Patient's aunt Anita Davis asking (again) if ECT an option.  Discussed with psych and patient is not a candidate for inpatient ECT. -11/15 d/w psych upon the recommendation added Abilify to current regimen of Zoloft to treat depression -Extensive discussion with patient's family and [facility] and stepmom: If patient becomes more functional i.e. is able to ambulate with minimal assistance and able to eat they would be willing to take her home but in her current state she is too much care and they are older and unable to manage her.  Discussed with him need to pursue guardianship which will be needed over the long-term.  ? Breast enlargement/gynecomastia -He has noticed patient's breasts are enlarging and question possible endocrine etiology-dates previously patient's breast only became this enlarged with pregnancy-firm patient has had several negative pregnancy tests both prior to admission and at time of admission -No significant weight gain since admission -We will check FSH and prolactin level.Will need to perform clinical breast  exam at bedside to ensure there are no masses or growths  Diarrhea>>>Constipation Resolved  Suspected oral Candida -Continue nystatin swish and swallow 4 times daily  Physical deconditioning -Working with PT and OT; inconsistent wakefulness setting ability to participate consistently -Continues to require extensive assist for all ADLs with recommendation for SNF -Continues to have significant care needs and requires 24/7 care as well is bolus tube feedings.  Unfortunately family would be unable to manage patient home environment with this level of care  Suspected chronic depression in context of ongoing polysubstance abuse prior to admission -Waxing and waning mental status 2/2 neurological insult and severe depression  -11/8 psych increased Zoloft to 100 mg daily and Abilify was added on 11/15  Dysphagia (resolved) with persistent inadequate oral intake - SLP recommended regular diet with thin liquids but with waxing and waning alertness inconsistent oral intake  -11/18 nutrition recommended transitioning to bolus tube feedings -Continue nocturnal tube feedings-Ensure boluses 2 times daily between meals  Nutrition Status/moderate to severe protein calorie malnutrition: Nutrition Status: Nutrition Problem: Inadequate oral intake Etiology: lethargy/confusion Signs/Symptoms: meal completion < 25% Interventions: Tube feeding, Prostat Estimated body mass index is 26.16 kg/m as calculated from the following:   Height as of this encounter: 5\' 5"  (1.651 m).   Weight as of this encounter: 71.3 kg.     Other problems: Dislodged gastrostomy tube -Placed by IR on 10/21  Nonobstructive transaminitis/hepatitis C -Elevated HCV antibody with markedly elevated HCV RNA quantitative level  consistent with chronic hepatitis C  -None total bilirubin  normal with slightly elevated alkaline phosphatase with AST 165 and ALT 274 -11/10: Discussed with ID Dr. Manson Passey.  Treatment of hepatitis C  typically is initiated in the outpatient setting.  Medications can be quite expensive and usually take time to obtain insurance approval.  Also since patient likely go to skilled nursing facility expensive medication could be barrier to admission. -ID recommends scheduling outpatient appointment their clinic closer to discharge date  Data Reviewed: Basic Metabolic Panel: Recent Labs  Lab 03/10/20 0808  NA 140  K 3.9  CL 101  CO2 25  GLUCOSE 96  BUN 17  CREATININE 0.60  CALCIUM 9.6   Liver Function Tests: No results for input(s): AST, ALT, ALKPHOS, BILITOT, PROT, ALBUMIN in the last 168 hours. No results for input(s): LIPASE, AMYLASE in the last 168 hours. No results for input(s): AMMONIA in the last 168 hours. CBC: No results for input(s): WBC, NEUTROABS, HGB, HCT, MCV, PLT in the last 168 hours. Cardiac Enzymes: No results for input(s): CKTOTAL, CKMB, CKMBINDEX, TROPONINI in the last 168 hours. BNP (last 3 results) No results for input(s): BNP in the last 8760 hours.  ProBNP (last 3 results) No results for input(s): PROBNP in the last 8760 hours.  CBG: No results for input(s): GLUCAP in the last 168 hours.  No results found for this or any previous visit (from the past 240 hour(s)).   Studies: DG Abd 1 View  Result Date: 03/10/2020 CLINICAL DATA:  Constipation, follow-up EXAM: ABDOMEN - 1 VIEW COMPARISON:  03/06/2020 FINDINGS: Gastrostomy tube is again noted. No significant stool burden. No dilated loops of bowel on this single view. IMPRESSION: No significant stool burden. Electronically Signed   By: Guadlupe Spanish M.D.   On: 03/10/2020 08:50    Scheduled Meds: . ARIPiprazole  2 mg Per Tube Daily  . Chlorhexidine Gluconate Cloth  6 each Topical Daily  . famotidine  20 mg Per Tube BID  . feeding supplement  237 mL Oral BID BM  . feeding supplement (OSMOLITE 1.5 CAL)  355 mL Per Tube TID  . feeding supplement (PROSource TF)  45 mL Per Tube TID  . free water  200 mL  Per Tube Q6H  . Gerhardt's butt cream  1 application Topical TID  . lacosamide  200 mg Per Tube BID  . modafinil  200 mg Oral Daily  . nystatin  5 mL Mouth/Throat QID  . PHENObarbital  60 mg Per Tube BID  . phenytoin  200 mg Per Tube BID  . potassium chloride  40 mEq Per Tube Daily  . vitamin B-6  100 mg Per Tube Daily  . sertraline  100 mg Per Tube Daily  . thiamine  100 mg Per Tube Daily   Continuous Infusions:   Principal Problem:   Refractory seizure (HCC) Active Problems:   Acute metabolic encephalopathy   Hypokalemia   Polysubstance abuse (HCC)   Elevated CK   Transaminitis   Chronic hepatitis C without hepatic coma (HCC)   Distended abdomen   Palliative care encounter   Colonic Ileus (HCC)   Organic brain syndrome   On enteral nutrition   Physical deconditioning   Protein-calorie malnutrition (HCC)   Inadequate oral nutritional intake   Consultants:  Neurology  Psychiatry  Interventional radiology  Surgery  Procedures:  9/14 lumbar puncture  9/15 EEG  9/16 EEG  9/17 EEG  9/19 overnight EEG with video  9/22 overnight EEG with video with discontinuation of long-term EEG monitoring on 9/25  9/24  core track placement  10/6 EEG  Antibiotics: Anti-infectives (From admission, onward)   Start     Dose/Rate Route Frequency Ordered Stop   02/11/20 1526  ceFAZolin (ANCEF) IVPB 2g/100 mL premix        2 g 200 mL/hr over 30 Minutes Intravenous  Once 02/11/20 1526 02/11/20 1641   02/05/20 0600  ceFAZolin (ANCEF) IVPB 2g/100 mL premix        2 g 200 mL/hr over 30 Minutes Intravenous To Short Stay 02/04/20 1054 02/05/20 0950   01/29/20 0600  ceFAZolin (ANCEF) IVPB 2g/100 mL premix        2 g 200 mL/hr over 30 Minutes Intravenous On call to O.R. 01/28/20 1511 01/30/20 0559   01/28/20 2000  cefTRIAXone (ROCEPHIN) 1 g in sodium chloride 0.9 % 100 mL IVPB        1 g 200 mL/hr over 30 Minutes Intravenous Every 24 hours 01/28/20 1925 02/02/20 0724    01/06/20 0900  cefTRIAXone (ROCEPHIN) 2 g in sodium chloride 0.9 % 100 mL IVPB  Status:  Discontinued        2 g 200 mL/hr over 30 Minutes Intravenous Every 12 hours 01/06/20 0105 01/06/20 2202   01/06/20 0800  vancomycin (VANCOCIN) IVPB 1000 mg/200 mL premix  Status:  Discontinued       "Followed by" Linked Group Details   1,000 mg 200 mL/hr over 60 Minutes Intravenous Every 12 hours 01/05/20 1904 01/06/20 2202   01/05/20 2000  vancomycin (VANCOREADY) IVPB 1500 mg/300 mL       "Followed by" Linked Group Details   1,500 mg 150 mL/hr over 120 Minutes Intravenous  Once 01/05/20 1904 01/06/20 0127   01/05/20 1830  cefTRIAXone (ROCEPHIN) 2 g in sodium chloride 0.9 % 100 mL IVPB        2 g 200 mL/hr over 30 Minutes Intravenous  Once 01/05/20 1829 01/05/20 2220       Time spent: 35 minutes    Junious SilkAllison Viyaan Champine ANP  [facility] Pager [phone]. If 7PM-7AM, please contact night-coverage at [email or web address] 03/11/2020, 11:29 AM  LOS: 65 days

## 2020-03-12 LAB — FOLLICLE STIMULATING HORMONE: FSH: 12.7 m[IU]/mL

## 2020-03-12 LAB — ESTROGENS, TOTAL: Estrogen: 101 pg/mL

## 2020-03-12 LAB — PROLACTIN: Prolactin: 3.3 ng/mL — ABNORMAL LOW (ref 4.8–23.3)

## 2020-03-12 NOTE — Progress Notes (Signed)
Patient ID: Anita Davis, female   DOB: 10-Aug-1985, 34 y.o.   MRN: [id number]  PROGRESS NOTE    Anita Davis  PPJ:[id number] DOB: 11-Dec-1985 DOA: 01/04/2020 PCP: [name], PA-C   Brief Narrative:  34 y.o.F with history of substance use disorder, active, hepatitis C, and gestational diabetes who presented with confusion.  Patient initially admitted in September with encephalopathy, thought to be from drug overdose. LP showed no signs of infection. MRI brain unremarkable. She remained encephalopathic, and EEG showed focal seizures. The seizures were suppressed, and she was transferred to [facility] for continuous EEG, but after seizure control, she had poor neurologic recovery. 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 She deteriorated after steroids were stopped, so IVIG was started.  Since admission patient has had waxing and waning mentation ranging from completely unresponsive to quite alert, confused and verbal and able to express her needs.  Unfortunately these episodes of wakefulness are never sustained and patient will likely be struggling with abnormal mentation for a sustained period of time therefore long-term SNF recommended.  She is unable to eat enough by mouth therefore tube feedings continue per G-tube.  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  No definitive etiology determined.  Treated as autoimmune encephalitis with steroids and IVIG with no significant improvement -Follow-up EEG in the past 30 days without evidence of reemergence of seizures/status epilepticus -Continues with cyclic episodes of appropriate wakefulness vs minimally responsive and nonverbal state-  appropriate wakefulness more in the evening hours then daytime hours but again no definitive pattern otherwise has emerged -11/15 discussed with neurologist/Yadav -discontinued Symmetrel in favor of Provigil.(if this medication needs to be continued upon discharge we will need to discuss with TOC regarding preauthorization).  Provigil dose was decreased to 100 mg daily on 03/11/2020. -Patient's aunt [facility] asking (again) if ECT an option.  This has been discussed with psychiatry by prior provider and patient is not a candidate for inpatient ECT. -Continue Abilify and Zoloft as per psych recommendations to treat depression -Extensive discussion by prior provider with patient's family and [facility] and stepmom: If patient becomes more functional i.e. is able to ambulate with minimal assistance and able to eat they would be willing to take her home but in her current state she is too much care and they are older and unable to manage her.  Discussed with him need to pursue guardianship which will be needed over the long-term.  Suspected oral Candida -Continue nystatin swish and swallow  Physical deconditioning -will need SNF.  Social worker following  Suspected chronic depression in context of ongoing polysubstance abuse prior to admission --Waxing and waning mental status secondary to neurological insult and severe depression  -11/8: psych increased Zoloft to 100 mg daily and Abilify was added on 11/15  Dysphagia (resolved) with persistent inadequate oral intake - SLP recommended regular diet with thin liquids but with waxing and waning alertness: inconsistent oral intake  -11/18 nutrition recommended transitioning to bolus tube feedings -Continue nocturnal tube feedings.  Dietitian following  Moderate to severe protein calorie malnutrition -Follow nutrition recommendations  Nonobstructive transaminitis/hepatitis C -Outpatient follow-up with PCP/ID for consideration of treatment  DVT  prophylaxis: Lovenox  code Status: Full Family Communication: None at bedside Disposition Plan: Status is: Inpatient  Remains inpatient appropriate because:Inpatient  level of care appropriate due to severity of illness   Dispo: The patient is from: Home              Anticipated d/c is to: SNF              Anticipated d/c date is: > 3 days              Patient currently is medically stable to d/c.   Consultants: Neurology/psychiatry/general surgery/palliative care  Procedures:  LP EEG G tube placement  Antimicrobials:  Anti-infectives (From admission, onward)   Start     Dose/Rate Route Frequency Ordered Stop   02/11/20 1526  ceFAZolin (ANCEF) IVPB 2g/100 mL premix        2 g 200 mL/hr over 30 Minutes Intravenous  Once 02/11/20 1526 02/11/20 1641   02/05/20 0600  ceFAZolin (ANCEF) IVPB 2g/100 mL premix        2 g 200 mL/hr over 30 Minutes Intravenous To Short Stay 02/04/20 1054 02/05/20 0950   01/29/20 0600  ceFAZolin (ANCEF) IVPB 2g/100 mL premix        2 g 200 mL/hr over 30 Minutes Intravenous On call to O.R. 01/28/20 1511 01/30/20 0559   01/28/20 2000  cefTRIAXone (ROCEPHIN) 1 g in sodium chloride 0.9 % 100 mL IVPB        1 g 200 mL/hr over 30 Minutes Intravenous Every 24 hours 01/28/20 1925 02/02/20 0724   01/06/20 0900  cefTRIAXone (ROCEPHIN) 2 g in sodium chloride 0.9 % 100 mL IVPB  Status:  Discontinued        2 g 200 mL/hr over 30 Minutes Intravenous Every 12 hours 01/06/20 0105 01/06/20 2202   01/06/20 0800  vancomycin (VANCOCIN) IVPB 1000 mg/200 mL premix  Status:  Discontinued       "Followed by" Linked Group Details   1,000 mg 200 mL/hr over 60 Minutes Intravenous Every 12 hours 01/05/20 1904 01/06/20 2202   01/05/20 2000  vancomycin (VANCOREADY) IVPB 1500 mg/300 mL       "Followed by" Linked Group Details   1,500 mg 150 mL/hr over 120 Minutes Intravenous  Once 01/05/20 1904 01/06/20 0127   01/05/20 1830  cefTRIAXone (ROCEPHIN) 2 g in sodium chloride 0.9 %  100 mL IVPB        2 g 200 mL/hr over 30 Minutes Intravenous  Once 01/05/20 1829 01/05/20 2220       Subjective: Patient seen and examined at bedside.  She is awake but hardly answers any questions.  Does not follow commands.  No overnight fever or vomiting reported.  Objective: Vitals:   03/12/20 0003 03/12/20 0336 03/12/20 0439 03/12/20 0812  BP: 117/83 108/74  118/87  Pulse: 63 70  85  Resp: 20 20  18   Temp: (!) 97.4 F (36.3 C) 97.8 F (36.6 C)  98.1 F (36.7 C)  TempSrc: Oral Oral  Oral  SpO2: 100% 99%  99%  Weight:   67.7 kg   Height:       No intake or output data in the 24 hours ending 03/12/20 1041 Filed Weights   03/08/20 0500 03/09/20 0300 03/12/20 0439  Weight: 71 kg 71.3 kg 67.7 kg    Examination:  General exam: Appears calm and comfortable.  No distress.  Awake, nonverbal, restless Respiratory system: Bilateral decreased breath sounds at bases Cardiovascular system: S1 & S2 heard, Rate controlled Gastrointestinal system: Abdomen is distended, soft and nontender. Normal bowel sounds heard.  G tube present. Extremities: No  cyanosis, clubbing; trace lower extremity edema    Data Reviewed: I have personally reviewed following labs and imaging studies  CBC: No results for input(s): WBC, NEUTROABS, HGB, HCT, MCV, PLT in the last 168 hours. Basic Metabolic Panel: Recent Labs  Lab 03/10/20 0808  NA 140  K 3.9  CL 101  CO2 25  GLUCOSE 96  BUN 17  CREATININE 0.60  CALCIUM 9.6   GFR: Estimated Creatinine Clearance: 87.5 mL/min (by C-G formula based on SCr of 0.6 mg/dL). Liver Function Tests: No results for input(s): AST, ALT, ALKPHOS, BILITOT, PROT, ALBUMIN in the last 168 hours. No results for input(s): LIPASE, AMYLASE in the last 168 hours. No results for input(s): AMMONIA in the last 168 hours. Coagulation Profile: No results for input(s): INR, PROTIME in the last 168 hours. Cardiac Enzymes: No results for input(s): CKTOTAL, CKMB, CKMBINDEX,  TROPONINI in the last 168 hours. BNP (last 3 results) No results for input(s): PROBNP in the last 8760 hours. HbA1C: No results for input(s): HGBA1C in the last 72 hours. CBG: No results for input(s): GLUCAP in the last 168 hours. Lipid Profile: No results for input(s): CHOL, HDL, LDLCALC, TRIG, CHOLHDL, LDLDIRECT in the last 72 hours. Thyroid Function Tests: No results for input(s): TSH, T4TOTAL, FREET4, T3FREE, THYROIDAB in the last 72 hours. Anemia Panel: No results for input(s): VITAMINB12, FOLATE, FERRITIN, TIBC, IRON, RETICCTPCT in the last 72 hours. Sepsis Labs: No results for input(s): PROCALCITON, LATICACIDVEN in the last 168 hours.  No results found for this or any previous visit (from the past 240 hour(s)).       Radiology Studies: No results found.      Scheduled Meds: . ARIPiprazole  2 mg Per Tube Daily  . Chlorhexidine Gluconate Cloth  6 each Topical Daily  . famotidine  20 mg Per Tube BID  . feeding supplement  237 mL Oral BID BM  . feeding supplement (OSMOLITE 1.5 CAL)  355 mL Per Tube TID  . feeding supplement (PROSource TF)  45 mL Per Tube TID  . free water  200 mL Per Tube Q6H  . Gerhardt's butt cream  1 application Topical TID  . lacosamide  200 mg Per Tube BID  . modafinil  100 mg Oral Daily  . nystatin  5 mL Mouth/Throat QID  . PHENObarbital  60 mg Per Tube BID  . phenytoin  200 mg Per Tube BID  . potassium chloride  40 mEq Per Tube Daily  . vitamin B-6  100 mg Per Tube Daily  . sertraline  100 mg Per Tube Daily  . thiamine  100 mg Per Tube Daily   Continuous Infusions:        Glade Lloyd, MD [facility] 03/12/2020, 10:41 AM

## 2020-03-12 NOTE — Progress Notes (Signed)
Occupational Therapy Treatment [name] MRN: [id number] DOB: [birth year] Today's Date: 03/12/2020    History of present illness 34 year old with past medical history significant for hepatitis C, polysubstance abuse brought to the hospital 9/13 for disorientation which was suspected to be substance abuse.  After she became more lucid, she was discharged, but then police brought her back later in the day and they found her passed out in a parking lot.  Urine drug screen was positive for benzodiazepine and THC. An EEG done on 9/15 showed patient was in status epilepticus.  Patient was a started on Keppra.  Despite these, EEG noted continued seizures requiring additional medications as well.  Finally on 9/18, seizures broke. Follow-up EEG on 9/20 noted evidence of epilepticity from the left central temporal region.  Repeat EEG done on 9/22 noted epileptogenicity from the left central temporal region.  Pt with PEG placed on 02/05/20.    OT comments  Patient continues to make minimal to no progress towards goals in skilled OT session. Patient's session encompassed full body bathing due to patient soiled in bed with assist from RN due to no command following and pt sideways in bed appearing to be in distress. Pt unable to visually track or acknowledge therapist, answer to name, or complete bathing tasks. Discharge remains appropriate, will continue to follow acutely.    Follow Up Recommendations  Supervision/Assistance - 24 hour;SNF    Equipment Recommendations  3 in 1 bedside commode;Wheelchair (measurements OT);Wheelchair cushion (measurements OT);Hospital bed    Recommendations for Other Services      Precautions / Restrictions Precautions Precautions: Fall Precaution Comments: Seizure, PEG abdominal binder Restrictions Weight Bearing Restrictions: No       Mobility Bed Mobility Overal bed mobility: Needs Assistance Bed Mobility: Rolling Rolling: Total assist             Transfers                      Balance                                           ADL either performed or assessed with clinical judgement   ADL Overall ADL's : Needs assistance/impaired         Upper Body Bathing: Total assistance   Lower Body Bathing: Total assistance   Upper Body Dressing : Total assistance   Lower Body Dressing: Total assistance       Toileting- Clothing Manipulation and Hygiene: Total assistance;Cueing for safety;Cueing for sequencing Toileting - Clothing Manipulation Details (indicate cue type and reason): soiled in bed upon arrival     Functional mobility during ADLs: Total assistance General ADL Comments: total A; soiled upon arrival requiring full bath     Vision       Perception     Praxis      Cognition Arousal/Alertness: Awake/alert;Lethargic Behavior During Therapy: Restless Overall Cognitive Status: Difficult to assess Area of Impairment: Attention;Following commands;Safety/judgement;Problem solving;Awareness                   Current Attention Level: Focused   Following Commands:  (did not follow commands) Safety/Judgement: Decreased awareness of safety;Decreased awareness of deficits Awareness: Intellectual Problem Solving: Slow processing;Requires verbal cues;Difficulty sequencing;Decreased initiation;Requires tactile cues General Comments: pt does not follow commands, demonstrates poor awareness of deficits and safety, requiring total for clean up  due to soiled bed and no tracking or acknowledgement of therapist noted        Exercises     Shoulder Instructions       General Comments      Pertinent Vitals/ Pain       Pain Assessment: Faces Faces Pain Scale: Hurts a little bit Pain Location: generalized Pain Descriptors / Indicators: Grimacing;Discomfort Pain Intervention(s): Monitored during session  Home Living                                           Prior Functioning/Environment              Frequency  Min 2X/week        Progress Toward Goals  OT Goals(current goals can now be found in the care plan section)  Progress towards OT goals: Not progressing toward goals - comment (no purposeful movements or command following, no tracking or acknowledgement of therapist)  Acute Rehab OT Goals Patient Stated Goal: pt unable to state OT Goal Formulation: Patient unable to participate in goal setting Time For Goal Achievement: 03/25/20 Potential to Achieve Goals: Fair  Plan Discharge plan remains appropriate    Co-evaluation                 AM-PAC OT "6 Clicks" Daily Activity     Outcome Measure   Help from another person eating meals?: Total Help from another person taking care of personal grooming?: Total Help from another person toileting, which includes using toliet, bedpan, or urinal?: Total Help from another person bathing (including washing, rinsing, drying)?: Total Help from another person to put on and taking off regular upper body clothing?: Total Help from another person to put on and taking off regular lower body clothing?: Total 6 Click Score: 6    End of Session    OT Visit Diagnosis: Unsteadiness on feet (R26.81);Muscle weakness (generalized) (M62.81);Pain;Other symptoms and signs involving the nervous system (R29.898);Other symptoms and signs involving cognitive function;Cognitive communication deficit (R41.841);Ataxia, unspecified (R27.0);Feeding difficulties (R63.3);Other abnormalities of gait and mobility (R26.89)   Activity Tolerance Patient limited by lethargy;Patient limited by fatigue   Patient Left in bed;with call bell/phone within reach;with bed alarm set   Nurse Communication Mobility status        Time: 5956-3875 OT Time Calculation (min): 18 min  Charges: OT General Charges $OT Visit: 1 Visit OT Treatments $Self Care/Home Management : 8-22 mins  Pollyann Glen E. Sonya Gunnoe,  COTA/L Acute Rehabilitation Services [phone]   Cherlyn Cushing 03/12/2020, 3:46 PM

## 2020-03-13 ENCOUNTER — Inpatient Hospital Stay (HOSPITAL_COMMUNITY): Payer: Medicaid Other

## 2020-03-13 DIAGNOSIS — Z789 Other specified health status: Secondary | ICD-10-CM

## 2020-03-13 DIAGNOSIS — E639 Nutritional deficiency, unspecified: Secondary | ICD-10-CM

## 2020-03-13 NOTE — Progress Notes (Signed)
Patient ID: Anita Davis, female   DOB: 10-Aug-1985, 34 y.o.   MRN: [id number]  PROGRESS NOTE    Anita Davis  PPJ:[id number] DOB: 11-Dec-1985 DOA: 01/04/2020 PCP: [name], PA-C   Brief Narrative:  34 y.o.F with history of substance use disorder, active, hepatitis C, and gestational diabetes who presented with confusion.  Patient initially admitted in September with encephalopathy, thought to be from drug overdose. LP showed no signs of infection. MRI brain unremarkable. She remained encephalopathic, and EEG showed focal seizures. The seizures were suppressed, and she was transferred to [facility] for continuous EEG, but after seizure control, she had poor neurologic recovery. 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 She deteriorated after steroids were stopped, so IVIG was started.  Since admission patient has had waxing and waning mentation ranging from completely unresponsive to quite alert, confused and verbal and able to express her needs.  Unfortunately these episodes of wakefulness are never sustained and patient will likely be struggling with abnormal mentation for a sustained period of time therefore long-term SNF recommended.  She is unable to eat enough by mouth therefore tube feedings continue per G-tube.  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  No definitive etiology determined.  Treated as autoimmune encephalitis with steroids and IVIG with no significant improvement -Follow-up EEG in the past 30 days without evidence of reemergence of seizures/status epilepticus -Continues with cyclic episodes of appropriate wakefulness vs minimally responsive and nonverbal state-  appropriate wakefulness more in the evening hours then daytime hours but again no definitive pattern otherwise has emerged -11/15 discussed with neurologist/Yadav -discontinued Symmetrel in favor of Provigil.(if this medication needs to be continued upon discharge we will need to discuss with TOC regarding preauthorization).  Provigil dose was decreased to 100 mg daily on 03/11/2020. -Patient's aunt [facility] asking (again) if ECT an option.  This has been discussed with psychiatry by prior provider and patient is not a candidate for inpatient ECT. -Continue Abilify and Zoloft as per psych recommendations to treat depression -Extensive discussion by prior provider with patient's family and [facility] and stepmom: If patient becomes more functional i.e. is able to ambulate with minimal assistance and able to eat they would be willing to take her home but in her current state she is too much care and they are older and unable to manage her.  Discussed with him need to pursue guardianship which will be needed over the long-term.  Suspected oral Candida -Continue nystatin swish and swallow  Physical deconditioning -will need SNF.  Social worker following  Suspected chronic depression in context of ongoing polysubstance abuse prior to admission --Waxing and waning mental status secondary to neurological insult and severe depression  -11/8: psych increased Zoloft to 100 mg daily and Abilify was added on 11/15  Dysphagia (resolved) with persistent inadequate oral intake - SLP recommended regular diet with thin liquids but with waxing and waning alertness: inconsistent oral intake  -11/18 nutrition recommended transitioning to bolus tube feedings -Continue nocturnal tube feedings.  Dietitian following  Moderate to severe protein calorie malnutrition -Follow nutrition recommendations  Nonobstructive transaminitis/hepatitis C -Outpatient follow-up with PCP/ID for consideration of treatment  DVT  prophylaxis: Lovenox  code Status: Full Family Communication: None at bedside Disposition Plan: Status is: Inpatient  Remains inpatient appropriate because:Inpatient  level of care appropriate due to severity of illness   Dispo: The patient is from: Home              Anticipated d/c is to: SNF              Anticipated d/c date is: > 3 days              Patient currently is medically stable to d/c.   Consultants: Neurology/psychiatry/general surgery/palliative care  Procedures:  LP EEG G tube placement  Antimicrobials:  Anti-infectives (From admission, onward)   Start     Dose/Rate Route Frequency Ordered Stop   02/11/20 1526  ceFAZolin (ANCEF) IVPB 2g/100 mL premix        2 g 200 mL/hr over 30 Minutes Intravenous  Once 02/11/20 1526 02/11/20 1641   02/05/20 0600  ceFAZolin (ANCEF) IVPB 2g/100 mL premix        2 g 200 mL/hr over 30 Minutes Intravenous To Short Stay 02/04/20 1054 02/05/20 0950   01/29/20 0600  ceFAZolin (ANCEF) IVPB 2g/100 mL premix        2 g 200 mL/hr over 30 Minutes Intravenous On call to O.R. 01/28/20 1511 01/30/20 0559   01/28/20 2000  cefTRIAXone (ROCEPHIN) 1 g in sodium chloride 0.9 % 100 mL IVPB        1 g 200 mL/hr over 30 Minutes Intravenous Every 24 hours 01/28/20 1925 02/02/20 0724   01/06/20 0900  cefTRIAXone (ROCEPHIN) 2 g in sodium chloride 0.9 % 100 mL IVPB  Status:  Discontinued        2 g 200 mL/hr over 30 Minutes Intravenous Every 12 hours 01/06/20 0105 01/06/20 2202   01/06/20 0800  vancomycin (VANCOCIN) IVPB 1000 mg/200 mL premix  Status:  Discontinued       "Followed by" Linked Group Details   1,000 mg 200 mL/hr over 60 Minutes Intravenous Every 12 hours 01/05/20 1904 01/06/20 2202   01/05/20 2000  vancomycin (VANCOREADY) IVPB 1500 mg/300 mL       "Followed by" Linked Group Details   1,500 mg 150 mL/hr over 120 Minutes Intravenous  Once 01/05/20 1904 01/06/20 0127   01/05/20 1830  cefTRIAXone (ROCEPHIN) 2 g in sodium chloride 0.9 %  100 mL IVPB        2 g 200 mL/hr over 30 Minutes Intravenous  Once 01/05/20 1829 01/05/20 2220       Subjective: Patient seen and examined at bedside.  Awake but does not follow any commands.  No overnight vomiting or fever reported..  Objective: Vitals:   03/12/20 2052 03/12/20 2339 03/13/20 0352 03/13/20 0353  BP: (!) 114/92 112/76 126/89   Pulse: 94 83 72   Resp: 20 20 16    Temp: 98.2 F (36.8 C) 98.3 F (36.8 C) 97.6 F (36.4 C)   TempSrc: Oral Oral Oral   SpO2: 100% 98% 100%   Weight:    68.3 kg  Height:        Intake/Output Summary (Last 24 hours) at 03/13/2020 0722 Last data filed at 03/13/2020 0523 Gross per 24 hour  Intake --  Output 400 ml  Net -400 ml   Filed Weights   03/09/20 0300 03/12/20 0439 03/13/20 0353  Weight: 71.3 kg 67.7 kg 68.3 kg    Examination:  General exam: No acute distress.  Awake, does not follow commands.  Respiratory system: Decreased breath sounds at bases bilaterally Cardiovascular system: Rate controlled, S1-S2 heard Gastrointestinal system: Abdomen is distended, soft and  nontender.  Normal bowel sounds heard.  G tube present. Extremities: Mild lower extremity edema present; no cyanosis    Data Reviewed: I have personally reviewed following labs and imaging studies  CBC: No results for input(s): WBC, NEUTROABS, HGB, HCT, MCV, PLT in the last 168 hours. Basic Metabolic Panel: Recent Labs  Lab 03/10/20 0808  NA 140  K 3.9  CL 101  CO2 25  GLUCOSE 96  BUN 17  CREATININE 0.60  CALCIUM 9.6   GFR: Estimated Creatinine Clearance: 87.5 mL/min (by C-G formula based on SCr of 0.6 mg/dL). Liver Function Tests: No results for input(s): AST, ALT, ALKPHOS, BILITOT, PROT, ALBUMIN in the last 168 hours. No results for input(s): LIPASE, AMYLASE in the last 168 hours. No results for input(s): AMMONIA in the last 168 hours. Coagulation Profile: No results for input(s): INR, PROTIME in the last 168 hours. Cardiac Enzymes: No  results for input(s): CKTOTAL, CKMB, CKMBINDEX, TROPONINI in the last 168 hours. BNP (last 3 results) No results for input(s): PROBNP in the last 8760 hours. HbA1C: No results for input(s): HGBA1C in the last 72 hours. CBG: No results for input(s): GLUCAP in the last 168 hours. Lipid Profile: No results for input(s): CHOL, HDL, LDLCALC, TRIG, CHOLHDL, LDLDIRECT in the last 72 hours. Thyroid Function Tests: No results for input(s): TSH, T4TOTAL, FREET4, T3FREE, THYROIDAB in the last 72 hours. Anemia Panel: No results for input(s): VITAMINB12, FOLATE, FERRITIN, TIBC, IRON, RETICCTPCT in the last 72 hours. Sepsis Labs: No results for input(s): PROCALCITON, LATICACIDVEN in the last 168 hours.  No results found for this or any previous visit (from the past 240 hour(s)).       Radiology Studies: No results found.      Scheduled Meds: . ARIPiprazole  2 mg Per Tube Daily  . Chlorhexidine Gluconate Cloth  6 each Topical Daily  . famotidine  20 mg Per Tube BID  . feeding supplement  237 mL Oral BID BM  . feeding supplement (OSMOLITE 1.5 CAL)  355 mL Per Tube TID  . feeding supplement (PROSource TF)  45 mL Per Tube TID  . free water  200 mL Per Tube Q6H  . Gerhardt's butt cream  1 application Topical TID  . lacosamide  200 mg Per Tube BID  . modafinil  100 mg Oral Daily  . nystatin  5 mL Mouth/Throat QID  . PHENObarbital  60 mg Per Tube BID  . phenytoin  200 mg Per Tube BID  . potassium chloride  40 mEq Per Tube Daily  . vitamin B-6  100 mg Per Tube Daily  . sertraline  100 mg Per Tube Daily  . thiamine  100 mg Per Tube Daily   Continuous Infusions:        Glade Lloyd, MD [facility] 03/13/2020, 7:22 AM

## 2020-03-14 MED ORDER — ZOLPIDEM TARTRATE 5 MG PO TABS
10.0000 mg | ORAL_TABLET | Freq: Every day | ORAL | Status: DC
  Administered 2020-03-14 – 2020-03-17 (×4): 10 mg via ORAL
  Filled 2020-03-14 (×4): qty 2

## 2020-03-14 MED ORDER — LORAZEPAM 2 MG/ML IJ SOLN: 0.5000 mg | Freq: Once | INTRAMUSCULAR | Status: DC

## 2020-03-14 MED ORDER — MODAFINIL 100 MG PO TABS: 100.0000 mg | ORAL_TABLET | ORAL | Status: DC

## 2020-03-14 MED ORDER — LORAZEPAM 1 MG PO TABS
1.0000 mg | ORAL_TABLET | Freq: Once | ORAL | Status: AC
  Administered 2020-03-14: 1 mg
  Filled 2020-03-14: qty 1

## 2020-03-14 NOTE — Progress Notes (Signed)
[facility] PROGRESS NOTE  Anita Davis DJM:[id number] DOB: 10-06-1985 DOA: 01/04/2020 PCP: [name], PA-C     10/20 nonverbal and not making eye contact or following simple commands     10/25: Anita Davis much more alert today.  Eventually she was able to talk and express her needs.  Subsequently she was rolled out to the nurses station to continue to improve her socialization.  She was requesting a Covid vaccine.   Status: Inpatient-Remains inpatient appropriate because:Altered mental status and Unsafe d/c plan Medicaid and disability applications have been completed and are pending.  Unfortunately patient continues to have no bed offers for SNF at this juncture.   Dispo: The patient is from: Home              Anticipated d/c is to: SNF              Anticipated d/c date is: > 3 days              Patient currently is medically stable to d/c.  Due to UNSAFE DC PLAN-patient needs placement in skilled nursing facility based on altered mentation and inability to manage self-care.  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 Do not expect recovery to previous baseline  Due to inconsistent alertness and thus inadequate oral intake  patient will continue G-tube for feedings and medications   Code Status: Full Family Communication: Updated [name] "[address] [place] and patient's stepmother on 11/19 (telephone conversation greater than 30 minutes) DVT prophylaxis: Lovenox Vaccination status: [name] confirmed pt given [employer] COVID vaccine in June. 2nd vaccine given 11/2  HPI: 34 y.o. F with hx substance use disorder, active, hepatitis C, and gestational diabetes who presented with confusion.  Patient initially admitted in September with encephalopathy, thought to be from drug overdose.  LP showed no signs of infection. MRI brain unremarkable. She remained encephalopathic, and EEG  showed focal seizures.  The seizures were suppressed, and she was transferred to [facility] for continuous EEG, but after seizure control, she had poor neurologic recovery.  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  She deteriorated after steroids were stopped, so IVIG was started.  Since admission patient has had waxing and waning mentation ranging from completely unresponsive to quite alert, confused and verbal and able to express her needs.  Unfortunately these episodes of wakefulness are never sustained and patient will likely be struggling with abnormal mentation for a sustained period of time therefore long-term SNF recommended.  She is unable to eat enough by mouth therefore tube feedings continue per PEG  Subjective: Patient writhing in bed, slobbering profusely, mouth open eyes closed as if she is attempting to screen but unable to phonate.  Unable to get her to open her eyes or make eye contact.  Again remains nonverbal.  Objective: Vitals:   03/14/20 0905 03/14/20 1117  BP: (!) 128/111 131/90  Pulse: 79 82  Resp:  20  Temp: 97.8 F (36.6 C) 98.2 F (36.8 C)  SpO2: 99% 97%   No intake or output data in the 24 hours ending 03/14/20 1219 Filed Weights   03/12/20 0439 03/13/20 0353 03/14/20 0500  Weight: 67.7 kg 68.3 kg 68.7 kg    Exam: Constitutional: Awake, nonverbal, restless Respiratory: CTA bilaterally anteriorly, Normal respiratory effort.-RA Cardiovascular: Regular rate and rhythm. Adequate capillary refill.    Abdomen: non tender,G-tube in place  for bolus tube feedings.  Musculoskeletal: No tremors or spasticity  Neurologic: CN 2-12 grossly intact, observed spontaneous movement of extremities, bilateral strength 5/5 Psychiatric: Awake, eyes closed.  Writhing as described above with other behaviors as described above.    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  No definitive etiology determined.  Treated as autoimmune encephalitis with steroids and IVIG with no significant improvement -Follow-up EEG in the past 30 days without evidence of reemergence of seizures/status epilepticus -Has not had an episode of wakefulness with meaningful conversation or interaction in greater than 3 weeks despite transitioning from Symmetrel to Provigil, maximizing SSRI dose and adding Abilify. -Anecdotal documentation that daytime Ambien at 10 mg regularly can improve wakefulness and brain injury patients especially with the suspected anoxic component. -Tapering Provigil with plans to discontinue after discussing with neurologist Dr. Melynda Ripple with pharmacist  ? Breast enlargement/gynecomastia -No clinical evidence of breast enlargement -Her lactate level actually low with normal FSH -Appearance of breast enlargement reported by family likely related to improve nutrition as well as abdominal binder moving up underneath breasts and malpositioning of breasts.  Diarrhea>>>Constipation Resolved  Suspected oral Candida -Continue nystatin swish and swallow 4 times daily  Physical deconditioning -Given lack of wakefulness with appropriate interaction PT and OT report lack of response to therapies -Continues to require extensive assist for all ADLs with recommendation for SNF -Continues to have significant care needs and requires 24/7 care as well is bolus tube feedings.  Unfortunately family would be unable to manage patient home environment with this level of care  Suspected chronic depression in context of ongoing polysubstance abuse prior to admission -Waxing and waning mental status 2/2 neurological insult and severe  -Continue max dose Zoloft 100 mg daily and Abilify   Dysphagia  (resolved) with persistent inadequate oral intake - SLP recommended regular diet with thin liquids but with waxing and waning alertness inconsistent oral intake and unsafe oral intake due to increased risk for aspiration unless fed under 100% supervision -11/18 transitioned to bolus tube feedings  Nutrition Status/moderate to severe protein calorie malnutrition: Nutrition Status: Nutrition Problem: Inadequate oral intake Etiology: lethargy/confusion Signs/Symptoms: meal completion < 25% Interventions: Tube feeding, Prostat as described above Estimated body mass index is 25.2 kg/m as calculated from the following:   Height as of this encounter: 5\' 5"  (1.651 m).   Weight as of this encounter: 68.7 kg.     Other problems: Nonobstructive transaminitis/hepatitis C -Elevated HCV antibody with markedly elevated HCV RNA quantitative level  consistent with chronic hepatitis C  -None total bilirubin normal with slightly elevated alkaline phosphatase with AST 165 and ALT 274 -11/10: Discussed with ID Dr. 11-15-2005.  Treatment of hepatitis C typically is initiated in the outpatient setting.  Medications can be quite expensive and usually take time to obtain insurance approval.  Also since patient likely go to skilled nursing facility expensive medication could be barrier to admission. -ID recommends scheduling outpatient appointment their clinic closer to discharge date  Data Reviewed: Basic Metabolic Panel: Recent Labs  Lab 03/10/20 0808  NA 140  K 3.9  CL 101  CO2 25  GLUCOSE 96  BUN 17  CREATININE 0.60  CALCIUM 9.6   Liver Function Tests: No results for input(s): AST, ALT, ALKPHOS, BILITOT, PROT, ALBUMIN in the last 168 hours. No results for input(s): LIPASE, AMYLASE in the last 168 hours.  No results for input(s): AMMONIA in the last 168 hours. CBC: No results for input(s): WBC, NEUTROABS, HGB, HCT, MCV, PLT in the last 168 hours. Cardiac Enzymes: No results for input(s):  CKTOTAL, CKMB, CKMBINDEX, TROPONINI in the last 168 hours. BNP (last 3 results) No results for input(s): BNP in the last 8760 hours.  ProBNP (last 3 results) No results for input(s): PROBNP in the last 8760 hours.  CBG: No results for input(s): GLUCAP in the last 168 hours.  No results found for this or any previous visit (from the past 240 hour(s)).   Studies: DG Abd Portable 1V  Result Date: 03/13/2020 CLINICAL DATA:  Abdominal pain. EXAM: PORTABLE ABDOMEN - 1 VIEW COMPARISON:  03/10/2020 FINDINGS: Gastrostomy tube is present with tip over the stomach just left of midline in the upper abdomen. Bowel gas pattern demonstrates no evidence of obstruction. Air is present throughout the colon. No evidence of free peritoneal air. Remainder the exam is unchanged. IMPRESSION: Nonobstructive bowel gas pattern. Electronically Signed   By: Elberta Fortisaniel  Boyle M.D.   On: 03/13/2020 14:59    Scheduled Meds: . ARIPiprazole  2 mg Per Tube Daily  . Chlorhexidine Gluconate Cloth  6 each Topical Daily  . famotidine  20 mg Per Tube BID  . feeding supplement  237 mL Oral BID BM  . feeding supplement (OSMOLITE 1.5 CAL)  355 mL Per Tube TID  . feeding supplement (PROSource TF)  45 mL Per Tube TID  . free water  200 mL Per Tube Q6H  . Gerhardt's butt cream  1 application Topical TID  . lacosamide  200 mg Per Tube BID  . [START ON 03/15/2020] modafinil  100 mg Oral QODAY  . nystatin  5 mL Mouth/Throat QID  . PHENObarbital  60 mg Per Tube BID  . phenytoin  200 mg Per Tube BID  . potassium chloride  40 mEq Per Tube Daily  . vitamin B-6  100 mg Per Tube Daily  . sertraline  100 mg Per Tube Daily  . thiamine  100 mg Per Tube Daily  . zolpidem  10 mg Oral Daily   Continuous Infusions:   Principal Problem:   Refractory seizure (HCC) Active Problems:   Acute metabolic encephalopathy   Hypokalemia   Polysubstance abuse (HCC)   Elevated CK   Transaminitis   Chronic hepatitis C without hepatic coma  (HCC)   Distended abdomen   Palliative care encounter   Colonic Ileus (HCC)   Organic brain syndrome   On enteral nutrition   Physical deconditioning   Protein-calorie malnutrition (HCC)   Inadequate oral nutritional intake   Consultants:  Neurology  Psychiatry  Interventional radiology  Surgery  Procedures:  9/14 lumbar puncture  9/15 EEG  9/16 EEG  9/17 EEG  9/19 overnight EEG with video  9/22 overnight EEG with video with discontinuation of long-term EEG monitoring on 9/25  9/24 core track placement  10/6 EEG  Antibiotics: Anti-infectives (From admission, onward)   Start     Dose/Rate Route Frequency Ordered Stop   02/11/20 1526  ceFAZolin (ANCEF) IVPB 2g/100 mL premix        2 g 200 mL/hr over 30 Minutes Intravenous  Once 02/11/20 1526 02/11/20 1641   02/05/20 0600  ceFAZolin (ANCEF) IVPB 2g/100 mL premix        2 g 200 mL/hr over 30 Minutes Intravenous To Short Stay 02/04/20 1054 02/05/20 0950   01/29/20 0600  ceFAZolin (ANCEF) IVPB 2g/100 mL premix  2 g 200 mL/hr over 30 Minutes Intravenous On call to O.R. 01/28/20 1511 01/30/20 0559   01/28/20 2000  cefTRIAXone (ROCEPHIN) 1 g in sodium chloride 0.9 % 100 mL IVPB        1 g 200 mL/hr over 30 Minutes Intravenous Every 24 hours 01/28/20 1925 02/02/20 0724   01/06/20 0900  cefTRIAXone (ROCEPHIN) 2 g in sodium chloride 0.9 % 100 mL IVPB  Status:  Discontinued        2 g 200 mL/hr over 30 Minutes Intravenous Every 12 hours 01/06/20 0105 01/06/20 2202   01/06/20 0800  vancomycin (VANCOCIN) IVPB 1000 mg/200 mL premix  Status:  Discontinued       "Followed by" Linked Group Details   1,000 mg 200 mL/hr over 60 Minutes Intravenous Every 12 hours 01/05/20 1904 01/06/20 2202   01/05/20 2000  vancomycin (VANCOREADY) IVPB 1500 mg/300 mL       "Followed by" Linked Group Details   1,500 mg 150 mL/hr over 120 Minutes Intravenous  Once 01/05/20 1904 01/06/20 0127   01/05/20 1830  cefTRIAXone (ROCEPHIN) 2 g  in sodium chloride 0.9 % 100 mL IVPB        2 g 200 mL/hr over 30 Minutes Intravenous  Once 01/05/20 1829 01/05/20 2220       Time spent: 30 minutes    [name] ANP  [facility] Pager [phone]. If 7PM-7AM, please contact night-coverage at [email or web address] 03/14/2020, 12:19 PM  LOS: 68 days

## 2020-03-15 ENCOUNTER — Inpatient Hospital Stay (HOSPITAL_COMMUNITY): Payer: Medicaid Other

## 2020-03-15 LAB — CBC WITH DIFFERENTIAL/PLATELET
Abs Immature Granulocytes: 0.01 10*3/uL (ref 0.00–0.07)
Basophils Absolute: 0 10*3/uL (ref 0.0–0.1)
Basophils Relative: 1 %
Eosinophils Absolute: 0.1 10*3/uL (ref 0.0–0.5)
Eosinophils Relative: 1 %
HCT: 42.2 % (ref 36.0–46.0)
Hemoglobin: 14.3 g/dL (ref 12.0–15.0)
Immature Granulocytes: 0 %
Lymphocytes Relative: 52 %
Lymphs Abs: 2.9 10*3/uL (ref 0.7–4.0)
MCH: 31.8 pg (ref 26.0–34.0)
MCHC: 33.9 g/dL (ref 30.0–36.0)
MCV: 94 fL (ref 80.0–100.0)
Monocytes Absolute: 0.4 10*3/uL (ref 0.1–1.0)
Monocytes Relative: 7 %
Neutro Abs: 2.2 10*3/uL (ref 1.7–7.7)
Neutrophils Relative %: 39 %
Platelets: 398 10*3/uL (ref 150–400)
RBC: 4.49 MIL/uL (ref 3.87–5.11)
RDW: 13.9 % (ref 11.5–15.5)
WBC: 5.6 10*3/uL (ref 4.0–10.5)
nRBC: 0 % (ref 0.0–0.2)

## 2020-03-15 LAB — COMPREHENSIVE METABOLIC PANEL
ALT: 244 U/L — ABNORMAL HIGH (ref 0–44)
AST: 138 U/L — ABNORMAL HIGH (ref 15–41)
Albumin: 3.8 g/dL (ref 3.5–5.0)
Alkaline Phosphatase: 127 U/L — ABNORMAL HIGH (ref 38–126)
Anion gap: 10 (ref 5–15)
BUN: 13 mg/dL (ref 6–20)
CO2: 25 mmol/L (ref 22–32)
Calcium: 9.3 mg/dL (ref 8.9–10.3)
Chloride: 103 mmol/L (ref 98–111)
Creatinine, Ser: 0.49 mg/dL (ref 0.44–1.00)
GFR, Estimated: >60 mL/min (ref >60)
Glucose, Bld: 94 mg/dL (ref 70–99)
Potassium: 4.1 mmol/L (ref 3.5–5.1)
Sodium: 138 mmol/L (ref 135–145)
Total Bilirubin: 0.3 mg/dL (ref 0.3–1.2)
Total Protein: 6.5 g/dL (ref 6.5–8.1)

## 2020-03-15 MED ORDER — IBUPROFEN 100 MG/5ML PO SUSP
400.0000 mg | Freq: Three times a day (TID) | ORAL | Status: AC
  Administered 2020-03-15 – 2020-03-20 (×15): 400 mg
  Filled 2020-03-15 (×15): qty 20

## 2020-03-15 MED ORDER — TRAMADOL HCL 50 MG PO TABS
50.0000 mg | ORAL_TABLET | Freq: Four times a day (QID) | ORAL | Status: DC
  Filled 2020-03-15: qty 1

## 2020-03-15 MED ORDER — IBUPROFEN 100 MG/5ML PO SUSP
400.0000 mg | Freq: Three times a day (TID) | ORAL | Status: DC
  Filled 2020-03-15: qty 20

## 2020-03-15 MED ORDER — MODAFINIL 100 MG PO TABS
100.0000 mg | ORAL_TABLET | ORAL | Status: DC
  Administered 2020-03-19: 100 mg
  Filled 2020-03-15 (×2): qty 1

## 2020-03-15 MED ORDER — IOHEXOL 300 MG/ML  SOLN
100.0000 mL | Freq: Once | INTRAMUSCULAR | Status: AC | PRN
  Administered 2020-03-15: 100 mL via INTRAVENOUS

## 2020-03-15 MED ORDER — LORAZEPAM 2 MG/ML IJ SOLN
0.5000 mg | Freq: Once | INTRAMUSCULAR | Status: AC
  Administered 2020-03-15: 0.5 mg via INTRAVENOUS
  Filled 2020-03-15 (×2): qty 1

## 2020-03-15 MED ORDER — KETOROLAC TROMETHAMINE 15 MG/ML IJ SOLN
15.0000 mg | Freq: Four times a day (QID) | INTRAMUSCULAR | Status: DC | PRN
  Administered 2020-03-15 – 2020-03-18 (×6): 15 mg via INTRAVENOUS
  Filled 2020-03-15 (×6): qty 1

## 2020-03-15 MED ORDER — METHOCARBAMOL 500 MG PO TABS: 500.0000 mg | ORAL_TABLET | Freq: Three times a day (TID) | ORAL | Status: DC

## 2020-03-15 MED ORDER — METHOCARBAMOL 500 MG PO TABS
500.0000 mg | ORAL_TABLET | Freq: Three times a day (TID) | ORAL | Status: DC
  Administered 2020-03-15 – 2020-03-23 (×25): 500 mg
  Filled 2020-03-15 (×25): qty 1

## 2020-03-15 MED ORDER — ACETAMINOPHEN 325 MG PO TABS
650.0000 mg | ORAL_TABLET | Freq: Four times a day (QID) | ORAL | Status: DC | PRN
  Administered 2020-03-16 – 2020-03-31 (×10): 650 mg
  Filled 2020-03-15 (×11): qty 2

## 2020-03-15 NOTE — Plan of Care (Signed)
Pt not progressing at this time due to altered mental status and ongoing confusion.   Problem: Education: Goal: Knowledge of General Education information will improve Description: Including pain rating scale, medication(s)/side effects and non-pharmacologic comfort measures Outcome: Not Progressing   Problem: Health Behavior/Discharge Planning: Goal: Ability to manage health-related needs will improve Outcome: Not Progressing   Problem: Clinical Measurements: Goal: Ability to maintain clinical measurements within normal limits will improve Outcome: Not Progressing Goal: Will remain free from infection Outcome: Not Progressing Goal: Diagnostic test results will improve Outcome: Not Progressing Goal: Respiratory complications will improve Outcome: Not Progressing Goal: Cardiovascular complication will be avoided Outcome: Not Progressing   Problem: Activity: Goal: Risk for activity intolerance will decrease Outcome: Not Progressing   Problem: Nutrition: Goal: Adequate nutrition will be maintained Outcome: Not Progressing   Problem: Coping: Goal: Level of anxiety will decrease Outcome: Not Progressing   Problem: Elimination: Goal: Will not experience complications related to bowel motility Outcome: Not Progressing Goal: Will not experience complications related to urinary retention Outcome: Not Progressing   Problem: Pain Managment: Goal: General experience of comfort will improve Outcome: Not Progressing   Problem: Safety: Goal: Ability to remain free from injury will improve Outcome: Not Progressing   Problem: Skin Integrity: Goal: Risk for impaired skin integrity will decrease Outcome: Not Progressing   Problem: Education: Goal: Expressions of having a comfortable level of knowledge regarding the disease process will increase Outcome: Not Progressing   Problem: Coping: Goal: Ability to adjust to condition or change in health will improve Outcome: Not  Progressing Goal: Ability to identify appropriate support needs will improve Outcome: Not Progressing   Problem: Health Behavior/Discharge Planning: Goal: Compliance with prescribed medication regimen will improve Outcome: Not Progressing   Problem: Medication: Goal: Risk for medication side effects will decrease Outcome: Not Progressing   Problem: Clinical Measurements: Goal: Complications related to the disease process, condition or treatment will be avoided or minimized Outcome: Not Progressing Goal: Diagnostic test results will improve Outcome: Not Progressing   Problem: Safety: Goal: Verbalization of understanding the information provided will improve Outcome: Not Progressing   Problem: Self-Concept: Goal: Level of anxiety will decrease Outcome: Not Progressing Goal: Ability to verbalize feelings about condition will improve Outcome: Not Progressing

## 2020-03-15 NOTE — Progress Notes (Signed)
Occupational Therapy Treatment [name] MRN: [id number] DOB: 11/28/1985 Today's Date: 03/15/2020    History of present illness 34 year old with past medical history significant for hepatitis C, polysubstance abuse brought to the hospital 9/13 for disorientation which was suspected to be substance abuse.  After she became more lucid, she was discharged, but then police brought her back later in the day and they found her passed out in a parking lot.  Urine drug screen was positive for benzodiazepine and THC. An EEG done on 9/15 showed patient was in status epilepticus.  Patient was a started on Keppra.  Despite these, EEG noted continued seizures requiring additional medications as well.  Finally on 9/18, seizures broke. Follow-up EEG on 9/20 noted evidence of epilepticity from the left central temporal region.  Repeat EEG done on 9/22 noted epileptogenicity from the left central temporal region.  Pt with PEG placed on 02/05/20.    OT comments  Pt continues to appear liable during session. Pt continues to not verbalize any information or needs to staff. Pt incontinence in bed and still does not verbalize back to staff this session. Pt progressed with transfer using the eva walker with two therapist (A). Recommendation for SNF at this time.   Staff report no PO intake prior to session. Pt placed in social environment of RN station with snack to encourage PO intake.    Follow Up Recommendations  Supervision/Assistance - 24 hour;SNF    Equipment Recommendations  3 in 1 bedside commode;Wheelchair (measurements OT);Wheelchair cushion (measurements OT);Hospital bed    Recommendations for Other Services      Precautions / Restrictions Precautions Precautions: Fall Precaution Comments: Seizure, PEG, abdominal binder Restrictions Weight Bearing Restrictions: No       Mobility Bed Mobility Overal bed mobility: Needs Assistance       Supine to sit: Total assist;HOB  elevated     General bed mobility comments: Pt laying in bed with BLEs spread open with knees flexed towards ears, just finished BM upon arrival. Total A to initiate and move towards EOB to sit upright.  Transfers Overall transfer level: Needs assistance Equipment used:  (eva walker) Transfers: Sit to/from Stand Sit to Stand: +2 physical assistance;Max assist         General transfer comment: Assist to power to standing with poor initiation/activation, flexed trunk using RW initially. Stood from [employer], from chair x1. once upright, needing assist for balance.    Balance Overall balance assessment: Needs assistance Sitting-balance support: Feet supported;No upper extremity supported Sitting balance-Leahy Scale: Poor Sitting balance - Comments: External support needed for sitting EOB.   Standing balance support: During functional activity Standing balance-Leahy Scale: Poor Standing balance comment: Requires hands on assist for static and dynamic standing with UE support. Flexed posture.                           ADL either performed or assessed with clinical judgement   ADL Overall ADL's : Needs assistance/impaired Eating/Feeding: Maximal assistance Eating/Feeding Details (indicate cue type and reason): pt taking seveal bites of strawberry nutrigrain bar, does not attempt to eat chocolate ice cream or soda presented         Lower Body Bathing: Total assistance       Lower Body Dressing: Total assistance         Toileting - Clothing Manipulation Details (indicate cue type and reason): soiled with stool on arrival but not calling out for  help. pt with arms in the air pushing hands together, pt with legs full extended in the air     Functional mobility during ADLs: +2 for physical assistance;Minimal assistance General ADL Comments: pt requires (A) for elevation from surface as pt has decr initiation. pt used the eva walker this session and showed increase  comfort and progression     Vision       Perception     Praxis      Cognition Arousal/Alertness: Awake/alert Behavior During Therapy: Restless Overall Cognitive Status: Difficult to assess Area of Impairment: Problem solving                             Problem Solving: Slow processing;Requires verbal cues;Difficulty sequencing;Decreased initiation;Requires tactile cues General Comments: pt holding item with it being placed in hand, pt does initiate turning around with eva, pt does reaching x1 with cue. pt overall not following any commands. pt in bed with legs open in a birth like position on arrival but was soiled with stool. pt positioned at RN station with food eating then resumed this position. Staff are reporting pt positioning this way throughout the entire day which is new        Exercises     Shoulder Instructions       General Comments No extensor tone present today.    Pertinent Vitals/ Pain       Pain Assessment: Faces Faces Pain Scale: Hurts a little bit Pain Location: crying after initialy transfer but resolves with reposition Pain Descriptors / Indicators: Grimacing;Crying Pain Intervention(s): Monitored during session;Repositioned  Home Living                                          Prior Functioning/Environment              Frequency  Min 2X/week        Progress Toward Goals  OT Goals(current goals can now be found in the care plan section)  Progress towards OT goals: Progressing toward goals  Acute Rehab OT Goals Patient Stated Goal: pt unable to state OT Goal Formulation: Patient unable to participate in goal setting Time For Goal Achievement: 03/25/20 Potential to Achieve Goals: Fair ADL Goals Pt Will Perform Eating: sitting;with min assist Pt Will Perform Grooming: sitting;with min assist Additional ADL Goal #1: pt will demonstrate 2 greeting gestures to staff appropriately mod I   Plan Discharge  plan remains appropriate    Co-evaluation    PT/OT/SLP Co-Evaluation/Treatment: Yes Reason for Co-Treatment: For patient/therapist safety;To address functional/ADL transfers PT goals addressed during session: Mobility/safety with mobility;Balance;Proper use of DME OT goals addressed during session: ADL's and self-care;Proper use of Adaptive equipment and DME      AM-PAC OT "6 Clicks" Daily Activity     Outcome Measure   Help from another person eating meals?: Total Help from another person taking care of personal grooming?: Total Help from another person toileting, which includes using toliet, bedpan, or urinal?: Total Help from another person bathing (including washing, rinsing, drying)?: Total Help from another person to put on and taking off regular upper body clothing?: Total Help from another person to put on and taking off regular lower body clothing?: Total 6 Click Score: 6    End of Session    OT Visit Diagnosis: Unsteadiness on feet (R26.81);Muscle weakness (  generalized) (M62.81);Pain;Other symptoms and signs involving the nervous system (R29.898);Other symptoms and signs involving cognitive function;Cognitive communication deficit (R41.841);Ataxia, unspecified (R27.0);Feeding difficulties (R63.3);Other abnormalities of gait and mobility (R26.89)   Activity Tolerance Patient tolerated treatment well   Patient Left in chair;with call bell/phone within reach;Other (comment) (At RN station )   Nurse Communication Mobility status;Precautions        Time: [phone] OT Time Calculation (min): 20 min  Charges: OT General Charges $OT Visit: 1 Visit OT Treatments $Self Care/Home Management : 8-22 mins   Brynn, OTR/L  Acute Rehabilitation Services Pager: [phone] Office: [phone] .    Mateo Flow 03/15/2020, 3:32 PM

## 2020-03-15 NOTE — TOC Progression Note (Addendum)
Transition of Care [facility]) - Progression Note    Patient Details  Name: Nijae Doyel MRN: [id number] Date of Birth: 1985/06/24  Transition of Care [facility] CM/SW Contact  Terrial Rhodes, LCSWA Phone Number: 03/15/2020, 1:46 PM  Clinical Narrative:     No SNF bed offers for patient at this time. Medicaid and Disability applications are still pending.  CSW will continue to follow.    Expected Discharge Plan: Skilled Nursing Facility Barriers to Discharge: Continued Medical Work up, Inadequate or no insurance, Active Substance Use - Placement, SNF Pending payor source - LOG, SNF Pending Medicaid, SNF Pending bed offer  Expected Discharge Plan and Services Expected Discharge Plan: Skilled Nursing Facility In-house Referral: Financial Counselor   Post Acute Care Choice: Skilled Nursing Facility Living arrangements for the past 2 months: Mobile Home                                       Social Determinants of Health (SDOH) Interventions    Readmission Risk Interventions No flowsheet data found.

## 2020-03-15 NOTE — Progress Notes (Addendum)
Physical Therapy Treatment [name] MRN: [id number] DOB: Dec 12, 1985 Today's Date: 03/15/2020    History of Present Illness 34 year old with past medical history significant for hepatitis C, polysubstance abuse brought to the hospital 9/13 for disorientation which was suspected to be substance abuse.  After she became more lucid, she was discharged, but then police brought her back later in the day and they found her passed out in a parking lot.  Urine drug screen was positive for benzodiazepine and THC. An EEG done on 9/15 showed patient was in status epilepticus.  Patient was a started on Keppra.  Despite these, EEG noted continued seizures requiring additional medications as well.  Finally on 9/18, seizures broke. Follow-up EEG on 9/20 noted evidence of epilepticity from the left central temporal region.  Repeat EEG done on 9/22 noted epileptogenicity from the left central temporal region.  Pt with PEG placed on 02/05/20.     PT Comments    Patient progressing slowly with mobility today. Continues to require total A to initiate any movement. Today, pt required total A for bed mobility, Max A for standing and Mod A for gait training using EVA walker for support and close chair follow. Needed assist to propel EVA walker forward, with weight shifting and overall balance.  Pt did not do well with RW. Only able to follow 2 motor commands today. Pt nonverbal throughout session. Left at nurses station eating nutri grain bar as pt with no PO intake today per nurses. Will continue to follow.   Follow Up Recommendations  SNF;Supervision/Assistance - 24 hour     Equipment Recommendations  Wheelchair (measurements PT);Wheelchair cushion (measurements PT);Hospital bed;Other (comment);3in1 (PT)    Recommendations for Other Services       Precautions / Restrictions Precautions Precautions: Fall Precaution Comments: Seizure, PEG, abdominal binder Restrictions Weight Bearing  Restrictions: No    Mobility  Bed Mobility Overal bed mobility: Needs Assistance       Supine to sit: Total assist;HOB elevated     General bed mobility comments: Pt laying in bed with BLEs spread open with knees flexed towards ears, just finished BM upon arrival. Total A to initiate and move towards EOB to sit upright.  Transfers Overall transfer level: Needs assistance Equipment used:  (eva walker) Transfers: Sit to/from Stand Sit to Stand: +2 physical assistance;Max assist         General transfer comment: Assist to power to standing with poor initiation/activation, flexed trunk using RW initially. Stood from [employer], from chair x1. once upright, needing assist for balance.  Ambulation/Gait Ambulation/Gait assistance: Mod assist Gait Distance (Feet): 6 Feet (+100') Assistive device: Rolling walker (2 wheeled) (EVA walker) Gait Pattern/deviations: Trunk flexed;Shuffle;Narrow base of support;Step-through pattern Gait velocity: reduced Gait velocity interpretation: <1.31 ft/sec, indicative of household ambulator General Gait Details: Initially attempted to walk with RW however pt with flexed trunk, shuffle and RW too far anterior, transitioned to EVA walker and pt did much better, assist for forward propulsion, navigating EVA and balance.   Stairs             Wheelchair Mobility    Modified Rankin (Stroke Patients Only)       Balance Overall balance assessment: Needs assistance Sitting-balance support: Feet supported;No upper extremity supported Sitting balance-Leahy Scale: Poor Sitting balance - Comments: External support needed for sitting EOB.   Standing balance support: During functional activity Standing balance-Leahy Scale: Poor Standing balance comment: Requires hands on assist for static and dynamic standing with  UE support. Flexed posture.                            Cognition Arousal/Alertness: Awake/alert Behavior During Therapy:  Restless Overall Cognitive Status: Difficult to assess Area of Impairment: Problem solving                             Problem Solving: Slow processing;Requires verbal cues;Difficulty sequencing;Decreased initiation;Requires tactile cues General Comments: pt holding item with it being placed in hand, pt does initiate turning around with eva, pt does reaching x1 with cue. pt overall not following any commands. pt in bed with legs open in a birth like position on arrival but was soiled with stool. pt positioned at RN station with food eating then resumed this position. Staff are reporting pt positioning this way throughout the entire day which is new      Exercises      General Comments General comments (skin integrity, edema, etc.): No extensor tone present today.      Pertinent Vitals/Pain Pain Assessment: Faces Faces Pain Scale: Hurts a little bit Pain Location: crying after initialy transfer but resolves with reposition Pain Descriptors / Indicators: Grimacing;Crying Pain Intervention(s): Monitored during session;Repositioned    Home Living                      Prior Function            PT Goals (current goals can now be found in the care plan section) Acute Rehab PT Goals Patient Stated Goal: pt unable to state Progress towards PT goals: Progressing toward goals (slowly)    Frequency    Min 2X/week      PT Plan Current plan remains appropriate    Co-evaluation PT/OT/SLP Co-Evaluation/Treatment: Yes Reason for Co-Treatment: For patient/therapist safety;To address functional/ADL transfers PT goals addressed during session: Mobility/safety with mobility;Balance;Proper use of DME OT goals addressed during session: ADL's and self-care;Proper use of Adaptive equipment and DME      AM-PAC PT "6 Clicks" Mobility   Outcome Measure  Help needed turning from your back to your side while in a flat bed without using bedrails?: Total Help needed  moving from lying on your back to sitting on the side of a flat bed without using bedrails?: Total Help needed moving to and from a bed to a chair (including a wheelchair)?: A Lot Help needed standing up from a chair using your arms (e.g., wheelchair or bedside chair)?: A Lot Help needed to walk in hospital room?: A Little Help needed climbing 3-5 steps with a railing? : A Lot 6 Click Score: 11    End of Session Equipment Utilized During Treatment: Gait belt Activity Tolerance: Patient tolerated treatment well Patient left: in chair;with call bell/phone within reach;Other (comment) (sitting at nurse's station) Nurse Communication: Mobility status PT Visit Diagnosis: Unsteadiness on feet (R26.81);Muscle weakness (generalized) (M62.81);Difficulty in walking, not elsewhere classified (R26.2);Other symptoms and signs involving the nervous system (R29.898)     Time: 1352-1420 PT Time Calculation (min) (ACUTE ONLY): 28 min  Charges:  $Gait Training: 8-22 mins                     Vale Haven, PT, DPT Acute Rehabilitation Services Pager [phone] Office [phone]       Blake Divine A Lanier Ensign 03/15/2020, 3:37 PM

## 2020-03-15 NOTE — Progress Notes (Signed)
Nutrition Follow-up  DOCUMENTATION CODES:   Not applicable  INTERVENTION:  -d/c Ensure Enlive, pt not consuming -Continue to assist pt with all meals and encourage PO intake -Continue 63ml Prosource TF TID via PEG, each supplement provides 40 kcals and 11 grams of protein -Continue scheduled bolus feeding -- provide 1.5 cartons ( ) of Osmolite 1.5 cal via PEG TID and flush with 48ml free water before and after each bolus. Each 1.5 carton ( ) bolus will provide 532 kcals, 22 grams protein, and free water ( with free water flushes) -Continue additional free water flush Q6H ( )  TF regimen provides 1716 kcals (meets 95% of minimum estimated needs), 99 grams of protein, free water ( total free water)  NUTRITION DIAGNOSIS:   Inadequate oral intake related to lethargy/confusion as evidenced by meal completion < 25%.  ongoing  GOAL:   Patient will meet greater than or equal to 90% of their needs  Being addressed with TF  MONITOR:   TF tolerance, Labs, Weight trends, I & O's  REASON FOR ASSESSMENT:   Consult Enteral/tube feeding initiation and management  ASSESSMENT:   Pt with refractory seizures with acute metabolic encephalopathy. PMH includes hepatitis and polysubstance abuse.  9/24 Cortrak placed (gastric) 10/10 Cortrak dislodged, TF held 10/11 Cortrak advanced (tip in proximal duodenum per KUB), TF restarted 10/14 TF reduced to 58ml/hr due to colonic ileus 10/15 G-tube placement 10/16 TF resumed 10/18 TF rate returned to 63ml/hr; G-tube pulled out 1 inch so TF held 10/21 G-tube replaced 10/27 diet advanced to regular  Pt tearful during RD assessment; RD unable to obtain any meaningful information from pt at this time. Discussed pt with RN in detail. RN reports pt has not been accepting anything po and confirmed that pt has been receiving bolus TF as ordered -- 1.5 cartons ( ) of Osmolite 1.5 cal via PEG TID, flush with 107ml  free water before and after each bolus. Each 1.5 carton ( ) bolus will provide 532 kcals, 22 grams protein, and free water. Pt has also been receiving 30ml Prosource TF TID via PEG to provide an additional 120 kcals and 33 grams of protein. Pt does have orders for Ensure Enlive, but will d/c as pt has not been consuming.   Admit wt: 74.8 kg Current wt: 68.4 kg  No UOP documented.  Labs reviewed.  Medications: pepcid, free water Q6H, klor-con, vitamin B6, thiamine  Diet Order:   Diet Order            Diet regular Room service appropriate? Yes; Fluid consistency: Thin  Diet effective now                 EDUCATION NEEDS:   No education needs have been identified at this time  Skin:  Skin Assessment: Reviewed RN Assessment Skin Integrity Issues:: Incisions Incisions: abdomen  Last BM:  11/22 type 5  Height:   Ht Readings from Last 1 Encounters:  01/06/20 5\' 5"  (1.651 m)    Weight:   Wt Readings from Last 1 Encounters:  03/15/20 68.4 kg    BMI:  Body mass index is 25.09 kg/m.  Estimated Nutritional Needs:   Kcal:  1800-2000  Protein:  90-100 grams  Fluid:  >/=1.8L/d    03/17/20, MS, RD, LDN RD pager number and weekend/on-call pager number located in [place].

## 2020-03-15 NOTE — Progress Notes (Signed)
[facility] PROGRESS NOTE  Anita Davis IRJ:[id number] DOB: 1985/12/30 DOA: 01/04/2020 PCP: Anita Hawking, PA-C     10/20 nonverbal and not making eye contact or following simple commands     10/25: Anita Davis much more alert today.  Eventually she was able to talk and express her needs.  Subsequently she was rolled out to the nurses station to continue to improve her socialization.  She was requesting a Covid vaccine.   Status: Inpatient-Remains inpatient appropriate because:Altered mental status and Unsafe d/c plan Medicaid and disability applications have been completed and are pending.  Unfortunately patient continues to have no bed offers for SNF at this juncture.   Dispo: The patient is from: Home              Anticipated d/c is to: SNF              Anticipated d/c date is: > 3 days              Patient currently is medically stable to d/c.  Due to UNSAFE DC PLAN-patient needs placement in skilled nursing facility based on altered mentation and inability to manage self-care.  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 Do not expect recovery to previous baseline  Due to inconsistent alertness and thus inadequate oral intake  patient will continue G-tube for feedings and medications   Code Status: Full Family Communication: Updated Anita Davis "[address] [place] and patient's stepmother on 11/19 (telephone conversation greater than 30 minutes) DVT prophylaxis: Lovenox Vaccination status: Anita Davis confirmed pt given [employer] COVID vaccine in June. 2nd vaccine given 11/2  HPI: 34 y.o. F with hx substance use disorder, active, hepatitis C, and gestational diabetes who presented with confusion.  Patient initially admitted in September with encephalopathy, thought to be from drug overdose.  LP showed no signs of infection. MRI brain unremarkable. She remained encephalopathic, and EEG  showed focal seizures.  The seizures were suppressed, and she was transferred to [facility] for continuous EEG, but after seizure control, she had poor neurologic recovery.  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  She deteriorated after steroids were stopped, so IVIG was started.  Since admission patient has had waxing and waning mentation ranging from completely unresponsive to quite alert, confused and verbal and able to express her needs.  Unfortunately these episodes of wakefulness are never sustained and patient will likely be struggling with abnormal mentation for a sustained period of time therefore long-term SNF recommended.  She is unable to eat enough by mouth therefore tube feedings continue per PEG  Subjective: Patient remains restless in bed.  Again has mouth open and drooling but eyes are open.  Initially unresponsive to verbal or tactile stimulation but after persistent calm talking to patient at bedside and reorienting her to current situation status and touching her face and informing her that she needs to keep her mouth closed to decrease pain she did so slowly.  Not definitively actually following a command.  Later was informed by tech who took care of patient yesterday that she told her her back hurt.  Today I repositioned patient and placed a pillow behind her low back and this seemed to help her calm down as well  Objective: Vitals:   03/15/20 0740 03/15/20 1116  BP: (!) 114/91 120/81  Pulse: 91 83  Resp: 20 17  Temp: 97.6 F (  36.4 C) 98 F (36.7 C)  SpO2: 98% 98%    Intake/Output Summary (Last 24 hours) at 03/15/2020 1200 Last data filed at 03/14/2020 2331 Gross per 24 hour  Intake 480 ml  Output --  Net 480 ml   Filed Weights   03/13/20 0353 03/14/20 0500 03/15/20 0417  Weight: 68.3 kg 68.7 kg 68.4 kg    Exam: Constitutional: Awake, nonverbal, restless but did become more calm with  repositioning Respiratory: CTA bilaterally anteriorly, Normal respiratory effort.-RA Cardiovascular: Regular rate and rhythm. Adequate capillary refill.   No peripheral edema. Abdomen: non tender,G-tube in place for bolus tube feedings.  Musculoskeletal: No tremors but does appear to have developed some dystonic purposeful movements since initiation of Abilify Neurologic: CN 2-12 grossly intact, observed spontaneous movement of extremities, bilateral strength 5/5 Psychiatric: Awake, eyes closed.  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  No definitive etiology determined.  Treated as autoimmune encephalitis with steroids and IVIG with no significant improvement -Follow-up EEG in the past 30 days without evidence of reemergence of seizures/status epilepticus -Has not had an episode of wakefulness with meaningful conversation or interaction in greater than 3 weeks despite transitioning from Symmetrel to Provigil, maximizing SSRI dose and adding Abilify. -Anecdotal documentation that daytime Ambien at 10 mg regularly can improve wakefulness and brain injury patients especially with the suspected anoxic component. -Tapering Provigil with plans to discontinue after discussing with neurologist Dr. Melynda Davis with pharmacist  Low back pain -May explain patient's restlessness in bed -Begin scheduled ibuprofen and Robaxin per tube -Unable to use Ultram secondary to seizure history  Physical deconditioning -Given lack of wakefulness with appropriate interaction PT and OT report lack of response to therapies -Continues to require extensive assist for all ADLs with recommendation for SNF -Has significant care needs and requires 24/7 care as well is bolus  tube feedings.  Unfortunately family would be unable to manage patient in the home environment with this level of care  Suspected chronic depression in context of ongoing polysubstance abuse prior to admission -Waxing and waning mental status 2/2 neurological insult and severe  -Continue max dose Zoloft 100 mg daily  -Has developed some dystonic movements since initiation of Abilify.  These movements appear to be consistent with EPS/TD therefore have discontinued Abilify as of 11/23  Dysphagia (resolved) with persistent inadequate oral intake - SLP recommended regular diet with thin liquids but with waxing and waning alertness inconsistent oral intake and unsafe oral intake due to increased risk for aspiration unless fed under 100% supervision -11/18 transitioned to bolus tube feedings  Nutrition Status/moderate to severe protein calorie malnutrition: Nutrition Status: Nutrition Problem: Inadequate oral intake Etiology: lethargy/confusion Signs/Symptoms: meal completion < 25% Interventions: Tube feeding, Prostat as described above Estimated body mass index is 25.09 kg/m as calculated from the following:   Height as of this encounter: 5\' 5"  (1.651 m).   Weight as of this encounter: 68.4 kg.     Other problems: Nonobstructive transaminitis/hepatitis C -Elevated HCV antibody with markedly elevated HCV RNA quantitative level  consistent with chronic hepatitis C  -None total bilirubin normal with slightly elevated alkaline phosphatase with AST 165 and ALT 274 -11/10: Discussed with ID Dr. 11-15-2005.  Treatment of hepatitis C typically is initiated in the outpatient setting.  Medications can be quite expensive and usually take time  to obtain insurance approval.  Also since patient likely go to skilled nursing facility expensive medication could be barrier to admission. -ID recommends scheduling outpatient appointment their clinic closer to discharge date  ? Breast  enlargement/gynecomastia -No clinical evidence of breast enlargement -Her lactate level actually low with normal FSH -Appearance of breast enlargement reported by family likely related to improve nutrition as well as abdominal binder moving up underneath breasts and malpositioning of breasts.  Diarrhea>>>Constipation Resolved  Suspected oral Candida -Resolved after oral nystatin  Data Reviewed: Basic Metabolic Panel: Recent Labs  Lab 03/10/20 0808  NA 140  K 3.9  CL 101  CO2 25  GLUCOSE 96  BUN 17  CREATININE 0.60  CALCIUM 9.6   Liver Function Tests: No results for input(s): AST, ALT, ALKPHOS, BILITOT, PROT, ALBUMIN in the last 168 hours. No results for input(s): LIPASE, AMYLASE in the last 168 hours. No results for input(s): AMMONIA in the last 168 hours. CBC: No results for input(s): WBC, NEUTROABS, HGB, HCT, MCV, PLT in the last 168 hours. Cardiac Enzymes: No results for input(s): CKTOTAL, CKMB, CKMBINDEX, TROPONINI in the last 168 hours. BNP (last 3 results) No results for input(s): BNP in the last 8760 hours.  ProBNP (last 3 results) No results for input(s): PROBNP in the last 8760 hours.  CBG: No results for input(s): GLUCAP in the last 168 hours.  No results found for this or any previous visit (from the past 240 hour(s)).   Studies: DG Abd Portable 1V  Result Date: 03/13/2020 CLINICAL DATA:  Abdominal pain. EXAM: PORTABLE ABDOMEN - 1 VIEW COMPARISON:  03/10/2020 FINDINGS: Gastrostomy tube is present with tip over the stomach just left of midline in the upper abdomen. Bowel gas pattern demonstrates no evidence of obstruction. Air is present throughout the colon. No evidence of free peritoneal air. Remainder the exam is unchanged. IMPRESSION: Nonobstructive bowel gas pattern. Electronically Signed   By: Elberta Fortisaniel  Boyle M.D.   On: 03/13/2020 14:59    Scheduled Meds:  Chlorhexidine Gluconate Cloth  6 each Topical Daily   famotidine  20 mg Per Tube BID    feeding supplement (OSMOLITE 1.5 CAL)  355 mL Per Tube TID   feeding supplement (PROSource TF)  45 mL Per Tube TID   free water  200 mL Per Tube Q6H   Gerhardt's butt cream  1 application Topical TID   ibuprofen  400 mg Per Tube Q8H   lacosamide  200 mg Per Tube BID   methocarbamol  500 mg Per Tube TID   [START ON 03/17/2020] modafinil  100 mg Per Tube QODAY   nystatin  5 mL Mouth/Throat QID   PHENObarbital  60 mg Per Tube BID   phenytoin  200 mg Per Tube BID   potassium chloride  40 mEq Per Tube Daily   vitamin B-6  100 mg Per Tube Daily   sertraline  100 mg Per Tube Daily   thiamine  100 mg Per Tube Daily   zolpidem  10 mg Oral Daily   Continuous Infusions:   Principal Problem:   Refractory seizure (HCC) Active Problems:   Acute metabolic encephalopathy   Hypokalemia   Polysubstance abuse (HCC)   Elevated CK   Transaminitis   Chronic hepatitis C without hepatic coma (HCC)   Distended abdomen   Palliative care encounter   Colonic Ileus (HCC)   Organic brain syndrome   On enteral nutrition   Physical deconditioning   Protein-calorie malnutrition (HCC)   Inadequate oral nutritional  intake   Consultants:  Neurology  Psychiatry  Interventional radiology  Surgery  Procedures:  9/14 lumbar puncture  9/15 EEG  9/16 EEG  9/17 EEG  9/19 overnight EEG with video  9/22 overnight EEG with video with discontinuation of long-term EEG monitoring on 9/25  9/24 core track placement  10/6 EEG  Antibiotics: Anti-infectives (From admission, onward)   Start     Dose/Rate Route Frequency Ordered Stop   02/11/20 1526  ceFAZolin (ANCEF) IVPB 2g/100 mL premix        2 g 200 mL/hr over 30 Minutes Intravenous  Once 02/11/20 1526 02/11/20 1641   02/05/20 0600  ceFAZolin (ANCEF) IVPB 2g/100 mL premix        2 g 200 mL/hr over 30 Minutes Intravenous To Short Stay 02/04/20 1054 02/05/20 0950   01/29/20 0600  ceFAZolin (ANCEF) IVPB 2g/100 mL premix         2 g 200 mL/hr over 30 Minutes Intravenous On call to O.R. 01/28/20 1511 01/30/20 0559   01/28/20 2000  cefTRIAXone (ROCEPHIN) 1 g in sodium chloride 0.9 % 100 mL IVPB        1 g 200 mL/hr over 30 Minutes Intravenous Every 24 hours 01/28/20 1925 02/02/20 0724   01/06/20 0900  cefTRIAXone (ROCEPHIN) 2 g in sodium chloride 0.9 % 100 mL IVPB  Status:  Discontinued        2 g 200 mL/hr over 30 Minutes Intravenous Every 12 hours 01/06/20 0105 01/06/20 2202   01/06/20 0800  vancomycin (VANCOCIN) IVPB 1000 mg/200 mL premix  Status:  Discontinued       "Followed by" Linked Group Details   1,000 mg 200 mL/hr over 60 Minutes Intravenous Every 12 hours 01/05/20 1904 01/06/20 2202   01/05/20 2000  vancomycin (VANCOREADY) IVPB 1500 mg/300 mL       "Followed by" Linked Group Details   1,500 mg 150 mL/hr over 120 Minutes Intravenous  Once 01/05/20 1904 01/06/20 0127   01/05/20 1830  cefTRIAXone (ROCEPHIN) 2 g in sodium chloride 0.9 % 100 mL IVPB        2 g 200 mL/hr over 30 Minutes Intravenous  Once 01/05/20 1829 01/05/20 2220       Time spent: 30 minutes    [name] ANP  [facility] Pager [phone]. If 7PM-7AM, please contact night-coverage at [email or web address] 03/15/2020, 12:00 PM  LOS: 69 days

## 2020-03-16 LAB — URINALYSIS, ROUTINE W REFLEX MICROSCOPIC
Bilirubin Urine: NEGATIVE
Glucose, UA: >=500 mg/dL — AB
Ketones, ur: NEGATIVE mg/dL
Nitrite: NEGATIVE
Protein, ur: 100 mg/dL — AB
RBC / HPF: >50 RBC/hpf — ABNORMAL HIGH (ref 0–5)
Specific Gravity, Urine: >1.046 — ABNORMAL HIGH (ref 1.005–1.030)
pH: 6 (ref 5.0–8.0)

## 2020-03-16 MED ORDER — FREE WATER
400.0000 mL | Freq: Four times a day (QID) | Status: DC
  Administered 2020-03-16 – 2020-04-06 (×80): 400 mL

## 2020-03-16 MED ORDER — SERTRALINE HCL 50 MG PO TABS
50.0000 mg | ORAL_TABLET | Freq: Every day | ORAL | Status: DC
  Administered 2020-03-16 – 2020-03-19 (×4): 50 mg
  Filled 2020-03-16 (×4): qty 1

## 2020-03-16 MED ORDER — FLUCONAZOLE 40 MG/ML PO SUSR
150.0000 mg | Freq: Once | ORAL | Status: AC
  Administered 2020-03-16: 152 mg
  Filled 2020-03-16: qty 3.8

## 2020-03-16 MED ORDER — METRONIDAZOLE 50 MG/ML ORAL SUSPENSION
500.0000 mg | Freq: Two times a day (BID) | ORAL | Status: DC
  Administered 2020-03-16 – 2020-03-18 (×6): 500 mg
  Filled 2020-03-16 (×7): qty 10

## 2020-03-16 NOTE — Progress Notes (Addendum)
[facility] PROGRESS NOTE  Anita Davis OEU:[id number] DOB: Aug 29, 1985 DOA: 01/04/2020 PCP: [name], PA-C     10/20 nonverbal and not making eye contact or following simple commands     10/25: Anita Davis much more alert today.  Eventually she was able to talk and express her needs.  Subsequently she was rolled out to the nurses station to continue to improve her socialization.  She was requesting a Covid vaccine.   Status: Inpatient-Remains inpatient appropriate because:Altered mental status and Unsafe d/c plan Medicaid and disability applications have been completed and are pending.  Unfortunately patient continues to have no bed offers for SNF at this juncture.   Dispo: The patient is from: Home              Anticipated d/c is to: SNF              Anticipated d/c date is: > 3 days              Patient currently is medically stable to d/c.  Due to UNSAFE DC PLAN-patient needs placement in skilled nursing facility based on altered mentation and inability to manage self-care.  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 Do not expect recovery to previous baseline  Due to inconsistent alertness and thus inadequate oral intake  patient will continue G-tube for feedings and medications   Code Status: Full Family Communication: Updated [name] "[address] [place] and patient's stepmother on 11/19 (telephone conversation greater than 30 minutes) DVT prophylaxis: Lovenox Vaccination status: [name] confirmed pt given [employer] COVID vaccine in June. 2nd vaccine given 11/2  HPI: 34 y.o. F with hx substance use disorder, active, hepatitis C, and gestational diabetes who presented with confusion.  Patient initially admitted in September with encephalopathy, thought to be from drug overdose.  LP showed no signs of infection. MRI brain unremarkable. She remained encephalopathic, and EEG  showed focal seizures.  The seizures were suppressed, and she was transferred to [facility] for continuous EEG, but after seizure control, she had poor neurologic recovery.  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  She deteriorated after steroids were stopped, so IVIG was started.  Since admission patient has had waxing and waning mentation ranging from completely unresponsive to quite alert, confused and verbal and able to express her needs.  Unfortunately these episodes of wakefulness are never sustained and patient will likely be struggling with abnormal mentation for a sustained period of time therefore long-term SNF recommended.  She is unable to eat enough by mouth therefore tube feedings continue per PEG  Subjective: Awake and remains nonverbal.  Very restless and uncertain if uncomfortable versus other reasons for restlessness.  Able to gently redirect behaviors but returns to abnormal behaviors as follows: Initially when I entered room patient's hands were lifted above her head and she was flapping her hands backwards and forwards, after I pulled her arms down she put her hands together and was rubbing them like she was washing them.  She repeatedly either keeps her hips flexed at the waist and has her knees pulled back sometimes pushing as if she is trying to deliver a baby.  Other times the legs are just up in the air and she is pulling covers on and off of them.  Objective: Vitals:   03/16/20 0336 03/16/20 0727  BP: 94/61 136/81  Pulse: 73 85  Resp: 18 18  Temp: 97.9 F (36.6 C) 97.8 F (36.6 C)  SpO2: 99%     Intake/Output Summary (Last 24 hours) at 03/16/2020 1226 Last data filed at 03/16/2020 0945 Gross per 24 hour  Intake 785 ml  Output --  Net 785 ml   Filed Weights   03/14/20 0500 03/15/20 0417 03/16/20 0500  Weight: 68.7 kg 68.4 kg 69.4 kg    Exam: Constitutional: Awake, nonverbal,  restless abnormal activity as described above Respiratory: CTA bilaterally, Normal respiratory effort.-RA Cardiovascular: Regular rate and rhythm. Adequate capillary refill.   No peripheral edema. Abdomen: non tender,G-tube in place for bolus tube feedings.  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  No definitive etiology determined.  Treated as autoimmune encephalitis with steroids and IVIG with no significant improvement -Follow-up EEG in the past 30 days without evidence of reemergence of seizures/status epilepticus -Has not had an episode of wakefulness with meaningful conversation or interaction in greater than 3 weeks despite transitioning from Symmetrel to Provigil, maximizing SSRI dose and adding Abilify. -Given dystonic movements and concerns over EPS/TD Abilify discontinued on 11/23.  After discussing with multiple care providers for this patient including SLP and OT a clear pattern has emerged that with increase of SSRI from 50 mg to 100 mg patient developed more depressive symptoms.  As of 11/24 this medication will be decreased to 50 mg/day and after 3 days we can go down to 25 mg and after 3 more days can discontinue.  At this juncture would like to minimize all psychotropic medications for this patient in the event these are contributing to some of her abnormal behaviors and  inability to verbalize. -Anecdotal documentation that daytime Ambien at 10 mg regularly can improve wakefulness and brain injury patients especially with the suspected anoxic component for this medication was initiated on 11/23 -Tapering Provigil with plans to discontinue after discussing with neurologist Dr. Melynda Ripple and with pharmacist  Low back pain -Facial expressions not consistent with likely pain as they had been for the past 48 hours -Begin scheduled ibuprofen and Robaxin per tube -Unable to use Ultram secondary to seizure history  Thick vaginal discharge -Appears consistent with vulvovaginal candidiasis therefore will give one-time dose of Diflucan 150 mg per tube -Empirically treat for possible bacterial vaginosis with Flagyl 500 mg BID x7 days  Abnormal urinalysis -Appears more consistent with dehydration noting hazy appearance, greater than 500 glucose, moderate hemoglobin and moderate leukocytes with > 50 RBC per high-power field.  Rare bacteria.  Does not have fever or other signs of infection but urine culture and blood cultures obtained and are pending -Due to increasing agitation and witnessed bearing down with pelvic muscles on 11/23 CT abdomen and pelvis was obtained that only revealed nonspecific bladder wall thickening consistent with possible cystitis  Physical deconditioning -Given lack of wakefulness with appropriate interaction PT and OT report lack of response to therapies -Continues to require extensive assist for all ADLs with recommendation for SNF -Has significant care needs and requires 24/7 care as well is bolus tube feedings.  Unfortunately family would be unable to manage patient in the home environment with this level of care  Suspected chronic depression in context of ongoing polysubstance abuse prior to admission -Waxing and waning mental status 2/2 neurological insult and severe  -11/24 tapering with eventual plans to DC Zoloft.  Dose decreased today to 50  mg and in 3 days recommendation is to decrease to 25 mg and in 3 more days and discontinue. -Has developed some dystonic movements since initiation of Abilify.  These movements appear to be consistent with EPS/TD therefore have discontinued Abilify as of 11/23  Dysphagia (resolved) with persistent inadequate oral intake - SLP recommended regular diet with thin liquids but with waxing and waning alertness inconsistent oral intake and unsafe oral intake due to increased risk for aspiration unless fed under 100% supervision -11/18 transitioned to bolus tube feedings  Nutrition Status/moderate to severe protein calorie malnutrition: Nutrition Status: Nutrition Problem: Inadequate oral intake Etiology: lethargy/confusion Signs/Symptoms: meal completion < 25% Interventions: Tube feeding, Prostat as described above Estimated body mass index is 25.46 kg/m as calculated from the following:   Height as of this encounter: 5\' 5"  (1.651 m).   Weight as of this encounter: 69.4 kg.     Other problems: Nonobstructive transaminitis/hepatitis C -Elevated HCV antibody with markedly elevated HCV RNA quantitative level  consistent with chronic hepatitis C  -None total bilirubin normal with slightly elevated alkaline phosphatase with AST 165 and ALT 274 -11/10: Discussed with ID Dr. Manson PasseyMandahar.  Treatment of hepatitis C typically is initiated in the outpatient setting.  Medications can be quite expensive and usually take time to obtain insurance approval.  Also since patient likely go to skilled nursing facility expensive medication could be barrier to admission. -ID recommends scheduling outpatient appointment their clinic closer to discharge date  ? Breast enlargement/gynecomastia -No clinical evidence of breast enlargement -Her lactate level actually low with normal FSH -Appearance of breast enlargement reported by family likely related to improve nutrition as well as abdominal binder moving up  underneath breasts and malpositioning of breasts.  Diarrhea>>>Constipation Resolved  Suspected oral Candida -Resolved after oral nystatin  Data Reviewed: Basic Metabolic Panel: Recent Labs  Lab 03/10/20 0808 03/15/20 1840  NA 140 138  K 3.9 4.1  CL 101 103  CO2 25 25  GLUCOSE 96 94  BUN 17 13  CREATININE 0.60 0.49  CALCIUM 9.6 9.3   Liver Function Tests: Recent Labs  Lab 03/15/20 1840  AST 138*  ALT 244*  ALKPHOS 127*  BILITOT 0.3  PROT 6.5  ALBUMIN 3.8   No results for input(s): LIPASE, AMYLASE in the last 168 hours. No results for input(s): AMMONIA in the last 168 hours. CBC: Recent Labs  Lab 03/15/20 1840  WBC 5.6  NEUTROABS 2.2  HGB 14.3  HCT 42.2  MCV 94.0  PLT 398   Cardiac Enzymes: No results for input(s): CKTOTAL, CKMB, CKMBINDEX, TROPONINI in the last 168 hours. BNP (last 3 results) No results for input(s): BNP in the last 8760 hours.  ProBNP (last 3 results) No results for input(s): PROBNP in the last 8760 hours.  CBG: No results for input(s): GLUCAP in the last 168 hours.  Recent Results (from the past 240 hour(s))  Culture, blood (Routine X 2) w Reflex to ID Panel     Status: None (Preliminary result)   Collection Time: 03/15/20  4:27 PM   Specimen: BLOOD RIGHT HAND  Result Value Ref Range Status   Specimen Description BLOOD RIGHT HAND  Final  Special Requests   Final    BOTTLES DRAWN AEROBIC ONLY Blood Culture results may not be optimal due to an inadequate volume of blood received in culture bottles   Culture   Final    NO GROWTH < 24 HOURS Performed at [facility], [address], [place], [place] [address]    Report Status PENDING  Incomplete  Culture, blood (Routine X 2) w Reflex to ID Panel     Status: None (Preliminary result)   Collection Time: 03/15/20  4:30 PM   Specimen: BLOOD RIGHT HAND  Result Value Ref Range Status   Specimen Description BLOOD RIGHT HAND  Final   Special Requests   Final    BOTTLES DRAWN  AEROBIC AND ANAEROBIC Blood Culture adequate volume   Culture   Final    NO GROWTH < 24 HOURS Performed at [facility], [address], [place], [place] [address]    Report Status PENDING  Incomplete     Studies: CT ABDOMEN PELVIS W CONTRAST  Result Date: 03/15/2020 CLINICAL DATA:  Nonlocalized abdominal pain. History of laparoscopic gastrostomy 02/05/2020. EXAM: CT ABDOMEN AND PELVIS WITH CONTRAST TECHNIQUE: Multidetector CT imaging of the abdomen and pelvis was performed using the standard protocol following bolus administration of intravenous contrast. CONTRAST:  OMNIPAQUE IOHEXOL 300 MG/ML  SOLN COMPARISON:  Insert CT abdomen pelvis 01/18/2020 FINDINGS: Lower chest: Bilateral lower lobe subsegmental atelectasis. No acute abnormality. Hepatobiliary: The liver is enlarged in size measuring up to 20 cm. No focal liver abnormality. The gallbladder is contracted. No gallstones, gallbladder wall thickening, or pericholecystic fluid. No biliary dilatation. Pancreas: No focal lesion. Normal pancreatic contour. No surrounding inflammatory changes. No main pancreatic ductal dilatation. Spleen: Normal in size without focal abnormality. A splenule is identified. Adrenals/Urinary Tract: No adrenal nodule bilaterally. Bilateral kidneys enhance symmetrically. Subcentimeter hypodensities are too small to characterize. Single punctate calcified stone within bilateral kidneys. No hydronephrosis. No hydroureter. The urinary bladder is decompressed with urinary bladder wall thickening and mucosal hyperenhancement. Stomach/Bowel: Gastrostomy tube in good position. A stomach is within normal limits. No evidence of bowel wall thickening or dilatation. Appendix appears normal. Vascular/Lymphatic: No significant vascular findings are present. No enlarged abdominal or pelvic lymph nodes. Reproductive: Uterus and bilateral adnexa are unremarkable. Other: No intraperitoneal free fluid. No intraperitoneal free gas.  No organized fluid collection. Musculoskeletal: No abdominal wall hernia or abnormality No suspicious lytic or blastic osseous lesions. No acute displaced fracture. IMPRESSION: 1. Urinary bladder wall thickening and mucosal hyperenhancement. Correlate with urinalysis for infection.Gastrostomy tube in good position. 2. Hepatomegaly. Electronically Signed   By: Tish Frederickson M.D.   On: 03/15/2020 22:07   DG CHEST PORT 1 VIEW  Result Date: 03/15/2020 CLINICAL DATA:  Altered mental status. EXAM: PORTABLE CHEST 1 VIEW COMPARISON:  January 31, 2020 FINDINGS: The heart size and mediastinal contours are within normal limits. Both lungs are clear. The visualized skeletal structures are unremarkable. IMPRESSION: No active disease. Electronically Signed   By: Aram Candela M.D.   On: 03/15/2020 17:26    Scheduled Meds: . Chlorhexidine Gluconate Cloth  6 each Topical Daily  . famotidine  20 mg Per Tube BID  . feeding supplement (OSMOLITE 1.5 CAL)  355 mL Per Tube TID  . feeding supplement (PROSource TF)  45 mL Per Tube TID  . fluconazole  152 mg Per Tube Once  . free water  400 mL Per Tube Q6H  . Gerhardt's butt cream  1 application Topical TID  .  ibuprofen  400 mg Per Tube Q8H  . lacosamide  200 mg Per Tube BID  . methocarbamol  500 mg Per Tube TID  . metroNIDAZOLE  500 mg Per Tube BID  . [START ON 03/17/2020] modafinil  100 mg Per Tube QODAY  . PHENObarbital  60 mg Per Tube BID  . phenytoin  200 mg Per Tube BID  . potassium chloride  40 mEq Per Tube Daily  . vitamin B-6  100 mg Per Tube Daily  . sertraline  50 mg Per Tube Daily  . thiamine  100 mg Per Tube Daily  . zolpidem  10 mg Oral Daily   Continuous Infusions:   Principal Problem:   Refractory seizure (HCC) Active Problems:   Acute metabolic encephalopathy   Hypokalemia   Polysubstance abuse (HCC)   Elevated CK   Transaminitis   Chronic hepatitis C without hepatic coma (HCC)   Distended abdomen   Palliative care encounter    Colonic Ileus (HCC)   Organic brain syndrome   On enteral nutrition   Physical deconditioning   Protein-calorie malnutrition (HCC)   Inadequate oral nutritional intake   Consultants:  Neurology  Psychiatry  Interventional radiology  Surgery  Procedures:  9/14 lumbar puncture  9/15 EEG  9/16 EEG  9/17 EEG  9/19 overnight EEG with video  9/22 overnight EEG with video with discontinuation of long-term EEG monitoring on 9/25  9/24 core track placement  10/6 EEG  Antibiotics: Anti-infectives (From admission, onward)   Start     Dose/Rate Route Frequency Ordered Stop   03/16/20 1130  metroNIDAZOLE (FLAGYL) 50 mg/ml oral suspension 500 mg        500 mg Per Tube 2 times daily 03/16/20 1040 03/23/20 0959   03/16/20 1130  fluconazole (DIFLUCAN) 40 MG/ML suspension 152 mg        150 mg Per Tube  Once 03/16/20 1040     02/11/20 1526  ceFAZolin (ANCEF) IVPB 2g/100 mL premix        2 g 200 mL/hr over 30 Minutes Intravenous  Once 02/11/20 1526 02/11/20 1641   02/05/20 0600  ceFAZolin (ANCEF) IVPB 2g/100 mL premix        2 g 200 mL/hr over 30 Minutes Intravenous To Short Stay 02/04/20 1054 02/05/20 0950   01/29/20 0600  ceFAZolin (ANCEF) IVPB 2g/100 mL premix        2 g 200 mL/hr over 30 Minutes Intravenous On call to O.R. 01/28/20 1511 01/30/20 0559   01/28/20 2000  cefTRIAXone (ROCEPHIN) 1 g in sodium chloride 0.9 % 100 mL IVPB        1 g 200 mL/hr over 30 Minutes Intravenous Every 24 hours 01/28/20 1925 02/02/20 0724   01/06/20 0900  cefTRIAXone (ROCEPHIN) 2 g in sodium chloride 0.9 % 100 mL IVPB  Status:  Discontinued        2 g 200 mL/hr over 30 Minutes Intravenous Every 12 hours 01/06/20 0105 01/06/20 2202   01/06/20 0800  vancomycin (VANCOCIN) IVPB 1000 mg/200 mL premix  Status:  Discontinued       "Followed by" Linked Group Details   1,000 mg 200 mL/hr over 60 Minutes Intravenous Every 12 hours 01/05/20 1904 01/06/20 2202   01/05/20 2000  vancomycin (VANCOREADY)  IVPB 1500 mg/300 mL       "Followed by" Linked Group Details   1,500 mg 150 mL/hr over 120 Minutes Intravenous  Once 01/05/20 1904 01/06/20 0127   01/05/20 1830  cefTRIAXone (ROCEPHIN) 2 g  in sodium chloride 0.9 % 100 mL IVPB        2 g 200 mL/hr over 30 Minutes Intravenous  Once 01/05/20 1829 01/05/20 2220       Time spent: 30 minutes    [name] ANP  [facility] Pager [phone]. If 7PM-7AM, please contact night-coverage at [email or web address] 03/16/2020, 12:26 PM  LOS: 70 days

## 2020-03-17 MED ORDER — SODIUM CHLORIDE 0.9 % IV SOLN
1.0000 g | INTRAVENOUS | Status: DC
  Administered 2020-03-17: 1 g via INTRAVENOUS
  Filled 2020-03-17: qty 10

## 2020-03-17 MED ORDER — ZOLPIDEM TARTRATE 5 MG PO TABS
10.0000 mg | ORAL_TABLET | Freq: Every day | ORAL | Status: DC
  Administered 2020-03-18 – 2020-03-21 (×4): 10 mg
  Filled 2020-03-17 (×4): qty 2

## 2020-03-17 MED ORDER — ENOXAPARIN SODIUM 40 MG/0.4ML ~~LOC~~ SOLN
40.0000 mg | SUBCUTANEOUS | Status: DC
  Administered 2020-03-17 – 2020-04-05 (×19): 40 mg via SUBCUTANEOUS
  Filled 2020-03-17 (×16): qty 0.4

## 2020-03-17 NOTE — Progress Notes (Signed)
PROGRESS NOTE    Anita Davis  ZOX:[id number] DOB: 1986/01/22 DOA: 01/04/2020 PCP: Jacquelin HawkingMcElroy, Shannon, PA-C    Brief Narrative:  Anita Davis is a 34 year old female with history of substance use disorder, hepatitis C, gestational diabetes who presented to the ED with confusion.  Admitted 01/04/2020 for encephalopathy thought to be secondary to from drug overdose. LP showed no signs of infection. MRI brain unremarkable. She remained encephalopathic, and EEG showed focal seizures. The seizures were suppressed, and [facility] was [facility], but after seizure control, she had poor neurologic recovery. 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 She deteriorated after steroids were stopped, so IVIG was started.  Since admission patient has had waxing and waning mentation ranging from completely unresponsive to quite alert, confused and verbal and able to express her needs.  Unfortunately these episodes of wakefulness are never sustained and patient will likely be struggling with abnormal mentation for a sustained period of time therefore long-term SNF recommended. 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  Treated for possible autoimmune encephalitis with steroids and IVIG with no significant improvement.  Follow-up EEG without evidence of reemergence of seizures/status epilepticus.  Patient continues to be fairly nonverbal during the day, but nursing reports she verbalizes more with good conversations during the night.  Vulvovaginal candidiasis Bacterial vaginosis --Status post Diflucan 150 mg on 11/24 --Flagyl 500 mg twice daily x7 days  Urinary tract infection Urinalysis 11/24 with moderate leukoesterase, negative nitrite, 6/10 WBCs.  Urine culture positive for greater than 100 K GNR's. --Given her inability to communicate urinary symptoms, will initiate ceftriaxone 1 g IV every 24 hours --Await further urine culture identification and susceptibilities  Chronic depression Currently on Zoloft taper. --Continue Zoloft 50 mg daily; will decrease to 25 mg on 11/27 x 3 days and then dc  Dysphagia with persistent inadequate oral intake Has been followed by speech therapy, dysphagia has improved with recommendations of regular diet with thin liquids, but oral intake has been waxing and waning given her alertness. --Continue tube feeds  Chronic hepatitis C Elevated HCV antibody with markedly elevated HCV RNA quantitative level consistent with chronic hepatitis C.  Case was discussed with infectious disease, Dr. Manson PasseyMandahar; with recommendation of outpatient referral closer to discharge.  Severe protein calorie malnutrition Body mass index is 26.16 kg/m. Nutrition Status: Nutrition Problem: Inadequate oral intake Etiology: lethargy/confusion Signs/Symptoms: meal completion < 25% Interventions: Tube feeding, Prostat --continue tube feeds, dietitian following    DVT prophylaxis: Lovenox Code Status: Full code Family Communication: No family present at bedside  Disposition Plan:  Status is: Inpatient  Remains inpatient appropriate  because:Unsafe d/c plan   Dispo: The patient is from: Home  Anticipated d/c is to: SNF              Anticipated d/c date is: 1 day              Patient currently is medically stable to d/c.   Consultants:   Neurology  Psychiatry  Interventional radiology  Surgery  Procedures:   9/14 lumbar puncture  9/15 EEG  9/16 EEG  9/17 EEG  9/19 overnight EEG with video  9/22 overnight EEG with video with discontinuation of long-term EEG monitoring on 9/25  9/24 core track placement  10/6 EEG  Antimicrobials:   Ceftriaxone 11/25>>   Subjective: Patient seen and examined bedside, lying crossways in bed.  Alert, but nonverbal.  Discussed with nursing, patient apparently was more verbal overnight with staff talking about her children, need for behavioral health on discharge.  No family present.  Unable to obtain any further ROS this morning due to her nonverbal state.  No acute concerns this morning per nursing staff.  Objective: Vitals:   03/16/20 2007 03/17/20 0043 03/17/20 0412 03/17/20 0844  BP: 101/68 97/66 97/62  98/66  Pulse: 90 80 68 83  Resp:  18 18 18   Temp:  97.7 F (36.5 C)  97.6 F (36.4 C)  TempSrc:  Oral  Axillary  SpO2: 100% 99% 97% 99%  Weight:   71.3 kg   Height:        Intake/Output Summary (Last 24 hours) at 03/17/2020 1040 Last data filed at 03/16/2020 2348 Gross per 24 hour  Intake 8695 ml  Output 600 ml  Net 8095 ml   Filed Weights   03/15/20 0417 03/16/20 0500 03/17/20 0412  Weight: 68.4 kg 69.4 kg 71.3 kg    Examination:  General exam: Appears calm and comfortable, lying crossways in bed Respiratory system: Clear to auscultation. Respiratory effort normal.  Oxygenating well on room air Cardiovascular system: S1 & S2 heard, RRR. No JVD, murmurs, rubs, gallops or clicks. No pedal edema. Gastrointestinal system: Abdomen is nondistended, soft and nontender. No organomegaly or masses felt. Normal bowel sounds heard.  PEG  tube noted with binder in place. Central nervous system: Alert.  Extremities: Moves all extremities independently Skin: No rashes, lesions or ulcers Psychiatry: Nonverbal    Data Reviewed: I have personally reviewed following labs and imaging studies  CBC: Recent Labs  Lab 03/15/20 1840  WBC 5.6  NEUTROABS 2.2  HGB 14.3  HCT 42.2  MCV 94.0  PLT 398   Basic Metabolic Panel: Recent Labs  Lab 03/15/20 1840  NA 138  K 4.1  CL 103  CO2 25  GLUCOSE 94  BUN 13  CREATININE 0.49  CALCIUM 9.3   GFR: Estimated Creatinine Clearance: 96.2 mL/min (by C-G formula based on SCr of 0.49 mg/dL). Liver Function Tests: Recent Labs  Lab 03/15/20 1840  AST 138*  ALT 244*  ALKPHOS 127*  BILITOT 0.3  PROT 6.5  ALBUMIN 3.8   No results for input(s): LIPASE, AMYLASE in the last 168 hours. No results for input(s): AMMONIA in the last 168 hours. Coagulation Profile: No results for input(s): INR, PROTIME in the last 168 hours. Cardiac Enzymes: No results for input(s): CKTOTAL, CKMB, CKMBINDEX, TROPONINI in the last 168 hours. BNP (last 3 results) No results for input(s): PROBNP in the last 8760 hours. HbA1C: No results for input(s): HGBA1C in the last 72 hours. CBG: No results for input(s): GLUCAP in the last 168 hours. Lipid Profile: No results for input(s): CHOL, HDL, LDLCALC, TRIG, CHOLHDL,  LDLDIRECT in the last 72 hours. Thyroid Function Tests: No results for input(s): TSH, T4TOTAL, FREET4, T3FREE, THYROIDAB in the last 72 hours. Anemia Panel: No results for input(s): VITAMINB12, FOLATE, FERRITIN, TIBC, IRON, RETICCTPCT in the last 72 hours. Sepsis Labs: No results for input(s): PROCALCITON, LATICACIDVEN in the last 168 hours.  Recent Results (from the past 240 hour(s))  Culture, blood (Routine X 2) w Reflex to ID Panel     Status: None (Preliminary result)   Collection Time: 03/15/20  4:27 PM   Specimen: BLOOD RIGHT HAND  Result Value Ref Range Status   Specimen  Description BLOOD RIGHT HAND  Final   Special Requests   Final    BOTTLES DRAWN AEROBIC ONLY Blood Culture results may not be optimal due to an inadequate volume of blood received in culture bottles   Culture   Final    NO GROWTH 2 DAYS Performed at [facility], [address], [place], [place] [address]    Report Status PENDING  Incomplete  Culture, blood (Routine X 2) w Reflex to ID Panel     Status: None (Preliminary result)   Collection Time: 03/15/20  4:30 PM   Specimen: BLOOD RIGHT HAND  Result Value Ref Range Status   Specimen Description BLOOD RIGHT HAND  Final   Special Requests   Final    BOTTLES DRAWN AEROBIC AND ANAEROBIC Blood Culture adequate volume   Culture   Final    NO GROWTH 2 DAYS Performed at [facility], [address] [place], [place] [address]    Report Status PENDING  Incomplete  Culture, Urine     Status: Abnormal (Preliminary result)   Collection Time: 03/16/20 12:27 AM   Specimen: Urine, Catheterized  Result Value Ref Range Status   Specimen Description URINE, CATHETERIZED  Final   Special Requests NONE  Final   Culture (A)  Final    >=100,000 COLONIES/mL GRAM NEGATIVE RODS SUSCEPTIBILITIES TO FOLLOW Performed at [facility], [name] [place], [place] [address]    Report Status PENDING  Incomplete         Radiology Studies: CT ABDOMEN PELVIS W CONTRAST  Result Date: 03/15/2020 CLINICAL DATA:  Nonlocalized abdominal pain. History of laparoscopic gastrostomy 02/05/2020. EXAM: CT ABDOMEN AND PELVIS WITH CONTRAST TECHNIQUE: Multidetector CT imaging of the abdomen and pelvis was performed using the standard protocol following bolus administration of intravenous contrast. CONTRAST:  OMNIPAQUE IOHEXOL 300 MG/ML  SOLN COMPARISON:  Insert CT abdomen pelvis 01/18/2020 FINDINGS: Lower chest: Bilateral lower lobe subsegmental atelectasis. No acute abnormality. Hepatobiliary: The liver is enlarged in size measuring up to 20  cm. No focal liver abnormality. The gallbladder is contracted. No gallstones, gallbladder wall thickening, or pericholecystic fluid. No biliary dilatation. Pancreas: No focal lesion. Normal pancreatic contour. No surrounding inflammatory changes. No main pancreatic ductal dilatation. Spleen: Normal in size without focal abnormality. A splenule is identified. Adrenals/Urinary Tract: No adrenal nodule bilaterally. Bilateral kidneys enhance symmetrically. Subcentimeter hypodensities are too small to characterize. Single punctate calcified stone within bilateral kidneys. No hydronephrosis. No hydroureter. The urinary bladder is decompressed with urinary bladder wall thickening and mucosal hyperenhancement. Stomach/Bowel: Gastrostomy tube in good position. A stomach is within normal limits. No evidence of bowel wall thickening or dilatation. Appendix appears normal. Vascular/Lymphatic: No significant vascular findings are present. No enlarged abdominal or pelvic lymph nodes. Reproductive: Uterus and bilateral adnexa are unremarkable. Other: No intraperitoneal free fluid. No intraperitoneal free gas. No organized fluid  collection. Musculoskeletal: No abdominal wall hernia or abnormality No suspicious lytic or blastic osseous lesions. No acute displaced fracture. IMPRESSION: 1. Urinary bladder wall thickening and mucosal hyperenhancement. Correlate with urinalysis for infection.Gastrostomy tube in good position. 2. Hepatomegaly. Electronically Signed   By: Tish Frederickson M.D.   On: 03/15/2020 22:07   DG CHEST PORT 1 VIEW  Result Date: 03/15/2020 CLINICAL DATA:  Altered mental status. EXAM: PORTABLE CHEST 1 VIEW COMPARISON:  January 31, 2020 FINDINGS: The heart size and mediastinal contours are within normal limits. Both lungs are clear. The visualized skeletal structures are unremarkable. IMPRESSION: No active disease. Electronically Signed   By: Aram Candela M.D.   On: 03/15/2020 17:26        Scheduled  Meds: . Chlorhexidine Gluconate Cloth  6 each Topical Daily  . famotidine  20 mg Per Tube BID  . feeding supplement (OSMOLITE 1.5 CAL)  355 mL Per Tube TID  . feeding supplement (PROSource TF)  45 mL Per Tube TID  . free water  400 mL Per Tube Q6H  . Gerhardt's butt cream  1 application Topical TID  . ibuprofen  400 mg Per Tube Q8H  . lacosamide  200 mg Per Tube BID  . methocarbamol  500 mg Per Tube TID  . metroNIDAZOLE  500 mg Per Tube BID  . modafinil  100 mg Per Tube QODAY  . PHENObarbital  60 mg Per Tube BID  . phenytoin  200 mg Per Tube BID  . potassium chloride  40 mEq Per Tube Daily  . vitamin B-6  100 mg Per Tube Daily  . sertraline  50 mg Per Tube Daily  . thiamine  100 mg Per Tube Daily  . zolpidem  10 mg Oral Daily   Continuous Infusions:   LOS: 71 days    Time spent: 32 minutes spent on chart review, discussion with nursing staff, consultants, updating family and interview/physical exam; more than 50% of that time was spent in counseling and/or coordination of care.    Alvira Philips [place], DO [facility] Available via Epic secure chat 7am-7pm After these hours, please refer to coverage provider listed on [email or web address] 03/17/2020, 10:40 AM

## 2020-03-17 NOTE — Plan of Care (Signed)

## 2020-03-18 MED ORDER — AMOXICILLIN 250 MG/5ML PO SUSR
500.0000 mg | Freq: Three times a day (TID) | ORAL | Status: DC
  Administered 2020-03-18 – 2020-03-22 (×13): 500 mg
  Filled 2020-03-18 (×21): qty 10

## 2020-03-18 MED ORDER — LORAZEPAM 2 MG/ML IJ SOLN
2.0000 mg | Freq: Once | INTRAMUSCULAR | Status: AC
  Administered 2020-03-18: 2 mg via INTRAMUSCULAR
  Filled 2020-03-18: qty 1

## 2020-03-18 NOTE — Progress Notes (Signed)
Physical Therapy Treatment [name] MRN: [id number] DOB: November 01, 1985 Today's Date: 03/18/2020    History of Present Illness 34 year old with past medical history significant for hepatitis C, polysubstance abuse brought to the hospital 9/13 for disorientation which was suspected to be substance abuse.  After she became more lucid, she was discharged, but then police brought her back later in the day and they found her passed out in a parking lot.  Urine drug screen was positive for benzodiazepine and THC. An EEG done on 9/15 showed patient was in status epilepticus.  Patient was a started on Keppra.  Despite these, EEG noted continued seizures requiring additional medications as well.  Finally on 9/18, seizures broke. Follow-up EEG on 9/20 noted evidence of epilepticity from the left central temporal region.  Repeat EEG done on 9/22 noted epileptogenicity from the left central temporal region.  Pt with PEG placed on 02/05/20.     PT Comments    Pt crying in bed with legs lifted all the way to face upon arrival. Pt assisted with bilat HHA to pull up to sit EOB with modA and HOB elevated. However, pt demonstrates a strong posterior push, resulting in up to maxA to maintain static sitting balance EOB with bouts of only min guard assist. This posterior push and her strong extensor tone this date resulted in difficulty flexing pt's legs to transfer sit <> stand and to maintain her standing balance. She required maxA for the transfer and to maintain her balance in standing for a short period of time. After the standing bout, pt became very restless and impulsive, continuously pushing forward against therapist to try to scoot/slide off the bed. RN was called to assist therapist with returning pt back to supine in bed due to safety concerns. Will continue to follow acutely. Current recommendations remain appropriate.    Follow Up Recommendations  SNF;Supervision/Assistance - 24 hour      Equipment Recommendations  Wheelchair (measurements PT);Wheelchair cushion (measurements PT);Hospital bed;Other (comment);3in1 (PT) (mechanical lift on occasion)    Recommendations for Other Services       Precautions / Restrictions Precautions Precautions: Fall Precaution Comments: Seizure, PEG, abdominal binder Restrictions Weight Bearing Restrictions: No    Mobility  Bed Mobility Overal bed mobility: Needs Assistance Bed Mobility: Rolling;Supine to Sit;Sit to Supine Rolling: Mod assist   Supine to sit: HOB elevated;Mod assist Sit to supine: Total assist;HOB elevated   General bed mobility comments: 2 HHA pulling up to sit EOB with modA and assistance to initiate. When returned to supine pt initiated roll and required modA to complete. Pt impulsive and restless trying to slide off EOB thus required assistance from RN to return pt to supine in bed safely with total assist.  Transfers Overall transfer level: Needs assistance Equipment used: 1 person hand held assist Transfers: Sit to/from Stand Sit to Stand: Max assist         General transfer comment: Required physical assistance to flex knees and block knees as pt demonstrates strong extensor tone with attempts at standing. Pt pushes posteriorly, resuting in pt requiring maxA to come up to stand with some initiation noted from pt at end of transfer.  Ambulation/Gait             General Gait Details: Unable to safely due to pt's imulsivity and lack of 2nd personnel this date   Stairs             Wheelchair Mobility    Modified Rankin (Stroke  Patients Only)       Balance Overall balance assessment: Needs assistance Sitting-balance support: Feet supported;No upper extremity supported Sitting balance-Leahy Scale: Fair Sitting balance - Comments: Pt able to hold balance with min guard assist and no UE support but would be impulsive and push posteriorly and start attempting to scoot/slide off EOB, thus  maxA for safety. Postural control: Posterior lean Standing balance support: During functional activity Standing balance-Leahy Scale: Poor Standing balance comment: Static standing x1 bout of ~45 sec with bilat knee block and cues at buttocks to extend hips. Pt tends to flex hips and resist with a posterior push, requiring maxA to maintain balance. Demonstrates strong extensor tone and required maxA to break tone to flex hips and knees to allow return to sit.                            Cognition Arousal/Alertness: Awake/alert Behavior During Therapy: Restless;Impulsive Overall Cognitive Status: Difficult to assess Area of Impairment: Attention;Following commands;Safety/judgement;Awareness;Problem solving                   Current Attention Level: Divided   Following Commands: Follows one step commands inconsistently (~5%) Safety/Judgement: Decreased awareness of safety;Decreased awareness of deficits Awareness: Intellectual Problem Solving: Slow processing;Decreased initiation;Difficulty sequencing;Requires verbal cues;Requires tactile cues General Comments: Pt following ~5% of tactile/physical commands but no verbal commands. Pt impulsive and restless attempting to slide off EOB continuously after standing bout, resulting in therapist calling RN to assist pt safely back into bed.      Exercises      General Comments        Pertinent Vitals/Pain Pain Assessment: Faces Faces Pain Scale: Hurts whole lot Pain Location: crying throughout session even upon arrival Pain Descriptors / Indicators: Grimacing;Crying Pain Intervention(s): Limited activity within patient's tolerance;Monitored during session;Repositioned;Other (comment) (notified RN)    Home Living                      Prior Function            PT Goals (current goals can now be found in the care plan section) Acute Rehab PT Goals Patient Stated Goal: pt unable to state PT Goal Formulation:  Patient unable to participate in goal setting Time For Goal Achievement: 03/22/20 Potential to Achieve Goals: Fair Progress towards PT goals: Not progressing toward goals - comment (pt impulsive and unable to follow majority of cues)    Frequency    Min 2X/week      PT Plan Current plan remains appropriate    Co-evaluation              AM-PAC PT "6 Clicks" Mobility   Outcome Measure  Help needed turning from your back to your side while in a flat bed without using bedrails?: A Lot Help needed moving from lying on your back to sitting on the side of a flat bed without using bedrails?: A Lot Help needed moving to and from a bed to a chair (including a wheelchair)?: A Lot Help needed standing up from a chair using your arms (e.g., wheelchair or bedside chair)?: A Lot Help needed to walk in hospital room?: Total Help needed climbing 3-5 steps with a railing? : Total 6 Click Score: 10    End of Session Equipment Utilized During Treatment: Gait belt Activity Tolerance: Treatment limited secondary to agitation Patient left: in bed;with call bell/phone within reach;with bed alarm set Nurse  Communication: Mobility status;Precautions;Other (comment) (crying and impulsivity) PT Visit Diagnosis: Unsteadiness on feet (R26.81);Muscle weakness (generalized) (M62.81);Difficulty in walking, not elsewhere classified (R26.2);Other symptoms and signs involving the nervous system (R29.898)     Time: 0349-1791 PT Time Calculation (min) (ACUTE ONLY): 15 min  Charges:  $Therapeutic Activity: 8-22 mins                     Raymond Gurney, PT, DPT Acute Rehabilitation Services  Pager: [phone] Office: [phone]    Jewel Baize 03/18/2020, 4:07 PM

## 2020-03-18 NOTE — Progress Notes (Signed)
Arrived to unite Autumn Patty RN requested that VAST return when patient calmer. This nurse instructed to reconsult VAST when patient ready for PIV placement. RN VU. Tomasita Morrow, RN VAST

## 2020-03-18 NOTE — Progress Notes (Signed)
PROGRESS NOTE    Anita Davis  STM:[id number] DOB: [birth year] DOA: 01/04/2020 PCP: [name], PA-C    Brief Narrative:  Anita Davis is a 34 year old female with history of substance use disorder, hepatitis C, gestational diabetes who presented to the ED with confusion.  Admitted 01/04/2020 for encephalopathy thought to be secondary to from drug overdose. LP showed no signs of infection. MRI brain unremarkable. She remained encephalopathic, and EEG showed focal seizures. The seizures were suppressed, and she was transferred to [facility] for continuous EEG, but after seizure control, she had poor neurologic recovery. 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 She deteriorated after steroids were stopped, so IVIG was started.  Since admission patient has had waxing and waning mentation ranging from completely unresponsive to quite alert, confused and verbal and able to express her needs.  Unfortunately these episodes of wakefulness are never sustained and patient will likely be struggling with abnormal mentation for a sustained period of time therefore long-term SNF recommended. 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  Treated for possible autoimmune encephalitis with steroids and IVIG with no significant improvement.  Follow-up EEG without evidence of reemergence of seizures/status epilepticus.  Patient continues to be fairly nonverbal during the day, but nursing reports she verbalizes more with good conversations during the night.  Vulvovaginal candidiasis Bacterial vaginosis --Received dose of Diflucan 150 mg on 11/24 --Flagyl 500 mg twice daily x7 days  Salmonella urinary tract infection Urinalysis 11/24 with moderate leukoesterase, negative nitrite, 6/10 WBCs.  Urine culture positive for greater than 100 K GNR's.  Urine culture positive for Salmonella. --De-escalate ceftriaxone to amoxicillin via tube per pharmacy/urine culture susceptibilities  Chronic depression Currently on Zoloft taper. --Continue Zoloft 50 mg daily; will decrease to 25 mg on 11/27 x 3 days and then dc  Dysphagia with persistent inadequate oral intake Has been followed by speech therapy, dysphagia has improved with recommendations of regular diet with thin liquids, but oral intake has been waxing and waning given her alertness. --Continue tube feeds  Chronic hepatitis C Elevated HCV antibody with markedly elevated HCV RNA quantitative level consistent with chronic hepatitis C.  Case was discussed with infectious disease, Dr. Manson Passey; with recommendation of outpatient referral closer to discharge.  Severe protein calorie malnutrition Body mass index is 26.16 kg/m. Nutrition Status: Nutrition Problem: Inadequate oral intake Etiology: lethargy/confusion Signs/Symptoms: meal completion < 25% Interventions: Tube feeding, Prostat --continue tube feeds, dietitian following    DVT prophylaxis: Lovenox Code Status: Full code Family Communication: No family present at bedside  Disposition Plan:  Status is: Inpatient  Remains inpatient appropriate because:Unsafe d/c  plan   Dispo: The patient is from: Home  Anticipated d/c is to: SNF              Anticipated d/c date is: 1 day              Patient currently is medically stable to d/c.   Consultants:   Neurology  Psychiatry  Interventional radiology  Surgery  Procedures:   9/14 lumbar puncture  9/15 EEG  9/16 EEG  9/17 EEG  9/19 overnight EEG with video  9/22 overnight EEG with video with discontinuation of long-term EEG monitoring on 9/25  9/24 core track placement  10/6 EEG  Antimicrobials:   Ceftriaxone 11/25 - 11/26  Amoxicillin 11/26>>   Subjective: Patient seen and examined bedside, nursing at bedside.  Patient with mild agitation this morning.  Continues to be nonverbal and not appropriately responsive to command.  No family present.  Unable to obtain any further ROS due to her nonverbal state/mental status.  No acute concerns per nursing staff this morning.  Continue to await placement  Objective: Vitals:   03/17/20 2317 03/18/20 0317 03/18/20 0441 03/18/20 0830  BP: 92/67 106/73  115/88  Pulse: 86 69  96  Resp: 18 18  18   Temp: 97.6 F (36.4 C) 98 F (36.7 C)  98.3 F (36.8 C)  TempSrc: Oral Axillary  Oral  SpO2: 99% 99%  100%  Weight:   71.3 kg   Height:        Intake/Output Summary (Last 24 hours) at 03/18/2020 0937 Last data filed at 03/18/2020 0400 Gross per 24 hour  Intake 3905 ml  Output --  Net 3905 ml   Filed Weights   03/16/20 0500 03/17/20 0412 03/18/20 0441  Weight: 69.4 kg 71.3 kg 71.3 kg    Examination:  General exam: Slightly agitated, nonverbal Respiratory system: Clear to auscultation. Respiratory effort normal.  Oxygenating well on room air Cardiovascular system: S1 & S2 heard, RRR. No JVD, murmurs, rubs, gallops or clicks. No pedal edema. Gastrointestinal system: Abdomen is nondistended, soft and nontender. No organomegaly or masses felt. Normal bowel sounds heard.  PEG tube noted with binder in place. Central  nervous system: Alert.  Extremities: Moves all extremities independently Skin: No rashes, lesions or ulcers Psychiatry: Nonverbal    Data Reviewed: I have personally reviewed following labs and imaging studies  CBC: Recent Labs  Lab 03/15/20 1840  WBC 5.6  NEUTROABS 2.2  HGB 14.3  HCT 42.2  MCV 94.0  PLT 398   Basic Metabolic Panel: Recent Labs  Lab 03/15/20 1840  NA 138  K 4.1  CL 103  CO2 25  GLUCOSE 94  BUN 13  CREATININE 0.49  CALCIUM 9.3   GFR: Estimated Creatinine Clearance: 96.2 mL/min (by C-G formula based on SCr of 0.49 mg/dL). Liver Function Tests: Recent Labs  Lab 03/15/20 1840  AST 138*  ALT 244*  ALKPHOS 127*  BILITOT 0.3  PROT 6.5  ALBUMIN 3.8   No results for input(s): LIPASE, AMYLASE in the last 168 hours. No results for input(s): AMMONIA in the last 168 hours. Coagulation Profile: No results for input(s): INR, PROTIME in the last 168 hours. Cardiac Enzymes: No results for input(s): CKTOTAL, CKMB, CKMBINDEX, TROPONINI in the last 168 hours. BNP (last 3 results) No results for input(s): PROBNP in the last 8760 hours. HbA1C: No results for input(s): HGBA1C in the last 72 hours. CBG: No results for input(s): GLUCAP in the last 168 hours. Lipid Profile: No results for input(s): CHOL, HDL, LDLCALC, TRIG, CHOLHDL, LDLDIRECT in the  last 72 hours. Thyroid Function Tests: No results for input(s): TSH, T4TOTAL, FREET4, T3FREE, THYROIDAB in the last 72 hours. Anemia Panel: No results for input(s): VITAMINB12, FOLATE, FERRITIN, TIBC, IRON, RETICCTPCT in the last 72 hours. Sepsis Labs: No results for input(s): PROCALCITON, LATICACIDVEN in the last 168 hours.  Recent Results (from the past 240 hour(s))  Culture, blood (Routine X 2) w Reflex to ID Panel     Status: None (Preliminary result)   Collection Time: 03/15/20  4:27 PM   Specimen: BLOOD RIGHT HAND  Result Value Ref Range Status   Specimen Description BLOOD RIGHT HAND  Final   Special  Requests   Final    BOTTLES DRAWN AEROBIC ONLY Blood Culture results may not be optimal due to an inadequate volume of blood received in culture bottles   Culture   Final    NO GROWTH 2 DAYS Performed at [facility], [address] [place], [place] [address]    Report Status PENDING  Incomplete  Culture, blood (Routine X 2) w Reflex to ID Panel     Status: None (Preliminary result)   Collection Time: 03/15/20  4:30 PM   Specimen: BLOOD RIGHT HAND  Result Value Ref Range Status   Specimen Description BLOOD RIGHT HAND  Final   Special Requests   Final    BOTTLES DRAWN AEROBIC AND ANAEROBIC Blood Culture adequate volume   Culture   Final    NO GROWTH 2 DAYS Performed at [facility], [address] [place], [place] [address]    Report Status PENDING  Incomplete  Culture, Urine     Status: Abnormal (Preliminary result)   Collection Time: 03/16/20 12:27 AM   Specimen: Urine, Catheterized  Result Value Ref Range Status   Specimen Description URINE, CATHETERIZED  Final   Special Requests NONE  Final   Culture (A)  Final    >=100,000 COLONIES/mL SALMONELLA SPECIES HEALTH DEPARTMENT NOTIFIED Performed at [facility], [address], [place], [place] [address]    Report Status PENDING  Incomplete   Organism ID, Bacteria SALMONELLA SPECIES (A)  Final      Susceptibility   Salmonella species - MIC*    AMPICILLIN <=2 SENSITIVE Sensitive     LEVOFLOXACIN <=0.12 SENSITIVE Sensitive     TRIMETH/SULFA <=20 SENSITIVE Sensitive     * >=100,000 COLONIES/mL SALMONELLA SPECIES         Radiology Studies: No results found.      Scheduled Meds: . amoxicillin  500 mg Per Tube Q8H  . Chlorhexidine Gluconate Cloth  6 each Topical Daily  . enoxaparin (LOVENOX) injection  40 mg Subcutaneous Q24H  . famotidine  20 mg Per Tube BID  . feeding supplement (OSMOLITE 1.5 CAL)  355 mL Per Tube TID  . feeding supplement (PROSource TF)  45 mL Per Tube TID  . free water  400 mL  Per Tube Q6H  . Gerhardt's butt cream  1 application Topical TID  . ibuprofen  400 mg Per Tube Q8H  . lacosamide  200 mg Per Tube BID  . methocarbamol  500 mg Per Tube TID  . metroNIDAZOLE  500 mg Per Tube BID  . modafinil  100 mg Per Tube QODAY  . PHENObarbital  60 mg Per Tube BID  . phenytoin  200 mg Per Tube BID  . potassium chloride  40 mEq Per Tube Daily  . vitamin B-6  100 mg Per Tube Daily  . sertraline  50  mg Per Tube Daily  . thiamine  100 mg Per Tube Daily  . zolpidem  10 mg Per Tube Daily   Continuous Infusions:   LOS: 72 days    Time spent: 32 minutes spent on chart review, discussion with nursing staff, consultants, updating family and interview/physical exam; more than 50% of that time was spent in counseling and/or coordination of care.    Alvira Philips [place], DO [facility] Available via Epic secure chat 7am-7pm After these hours, please refer to coverage provider listed on [email or web address] 03/18/2020, 9:37 AM

## 2020-03-19 MED ORDER — METRONIDAZOLE 500 MG PO TABS
500.0000 mg | ORAL_TABLET | Freq: Two times a day (BID) | ORAL | Status: DC
  Administered 2020-03-19 – 2020-03-22 (×7): 500 mg
  Filled 2020-03-19: qty 1
  Filled 2020-03-19 (×2): qty 2
  Filled 2020-03-19: qty 1
  Filled 2020-03-19: qty 2
  Filled 2020-03-19 (×2): qty 1
  Filled 2020-03-19 (×2): qty 2
  Filled 2020-03-19 (×4): qty 1

## 2020-03-19 MED ORDER — DIPHENHYDRAMINE HCL 25 MG PO CAPS
25.0000 mg | ORAL_CAPSULE | Freq: Four times a day (QID) | ORAL | Status: DC | PRN
  Filled 2020-03-19: qty 1

## 2020-03-19 MED ORDER — LORAZEPAM 2 MG/ML IJ SOLN
2.0000 mg | Freq: Once | INTRAMUSCULAR | Status: AC
  Administered 2020-03-20: 2 mg via INTRAMUSCULAR
  Filled 2020-03-19: qty 1

## 2020-03-19 MED ORDER — HALOPERIDOL LACTATE 5 MG/ML IJ SOLN
2.0000 mg | Freq: Four times a day (QID) | INTRAMUSCULAR | Status: DC | PRN
  Administered 2020-03-19 – 2020-03-20 (×3): 2 mg via INTRAMUSCULAR
  Filled 2020-03-19 (×3): qty 1

## 2020-03-19 MED ORDER — LORAZEPAM 2 MG/ML IJ SOLN
2.0000 mg | INTRAMUSCULAR | Status: DC | PRN
  Administered 2020-03-19 – 2020-03-20 (×3): 2 mg via INTRAMUSCULAR
  Filled 2020-03-19 (×3): qty 1

## 2020-03-19 MED ORDER — SERTRALINE HCL 50 MG PO TABS
25.0000 mg | ORAL_TABLET | Freq: Every day | ORAL | Status: AC
  Administered 2020-03-20 – 2020-03-22 (×3): 25 mg
  Filled 2020-03-19 (×3): qty 1

## 2020-03-19 MED ORDER — DIPHENHYDRAMINE HCL 50 MG/ML IJ SOLN
25.0000 mg | Freq: Four times a day (QID) | INTRAMUSCULAR | Status: DC | PRN
  Administered 2020-03-19 – 2020-03-21 (×5): 25 mg via INTRAMUSCULAR
  Filled 2020-03-19 (×6): qty 1

## 2020-03-19 NOTE — Plan of Care (Signed)
  Problem: Safety: Goal: Verbalization of understanding the information provided will improve Outcome: Progressing   Problem: Self-Concept: Goal: Level of anxiety will decrease Outcome: Progressing Goal: Ability to verbalize feelings about condition will improve Outcome: Progressing   

## 2020-03-19 NOTE — Progress Notes (Signed)
PROGRESS NOTE    Anita Davis  TMA:[id number] DOB: 1985-12-05 DOA: 01/04/2020 PCP: [name], PA-C    Brief Narrative:  Anita Davis is a 34 year old female with history of substance use disorder, hepatitis C, gestational diabetes who presented to the ED with confusion.  Admitted 01/04/2020 for encephalopathy thought to be secondary to from drug overdose. LP showed no signs of infection. MRI brain unremarkable. She remained encephalopathic, and EEG showed focal seizures. The seizures were suppressed, and she was transferred to [facility] for continuous EEG, but after seizure control, she had poor neurologic recovery. 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 She deteriorated after steroids were stopped, so IVIG was started.  Since admission patient has had waxing and waning mentation ranging from completely unresponsive to quite alert, confused and verbal and able to express her needs.  Unfortunately these episodes of wakefulness are never sustained and patient will likely be struggling with abnormal mentation for a sustained period of time therefore long-term SNF recommended. 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  Treated for possible autoimmune encephalitis with steroids and IVIG with no significant improvement.  Follow-up EEG without evidence of reemergence of seizures/status epilepticus.  Patient continues to be fairly nonverbal during the day, but nursing reports she verbalizes more with conversations during the night occasionally. --Overall poor prognosis with lack of significant improvement in cognition such far during the hospitalization --Palliative care following, appreciate assistance  Vulvovaginal candidiasis Bacterial vaginosis --Received dose of Diflucan 150 mg on 11/24 --Flagyl 500 mg twice daily x7 days  Salmonella urinary tract infection Urinalysis 11/24 with moderate leukoesterase, negative nitrite, 6/10 WBCs.  Urine culture positive for greater than 100 K GNR's.  Urine culture positive for Salmonella. --Continue amoxicillin via tube per pharmacy/urine culture susceptibilities  Chronic depression Currently on Zoloft taper. --Continue Zoloft; decrease to 25 mg on 11/28 x 3 days and then dc  Dysphagia with persistent inadequate oral intake Has been followed by speech therapy, dysphagia has improved with recommendations of regular diet with thin liquids, but oral intake has been waxing and waning given her alertness. --Continue tube feeds  Chronic hepatitis C Elevated HCV antibody with markedly elevated HCV RNA quantitative level consistent with chronic hepatitis C.  Case was discussed with infectious disease, Dr. Manson Passey; with recommendation of outpatient referral closer to discharge.  Severe protein calorie malnutrition Body mass index is 26.16 kg/m. Nutrition Status: Nutrition Problem: Inadequate oral intake Etiology: lethargy/confusion Signs/Symptoms: meal completion < 25% Interventions: Tube feeding, Prostat --continue tube feeds, dietitian following    DVT prophylaxis: Lovenox Code Status: Full code Family  Communication: No family present at bedside  Disposition Plan:  Status is: Inpatient  Remains inpatient appropriate because:Unsafe d/c plan   Dispo: The patient  is from: Home              Anticipated d/c is to: SNF              Anticipated d/c date is: 1 day              Patient currently is medically stable to d/c.   Consultants:   Neurology  Psychiatry  Interventional radiology  Surgery  Procedures:   9/14 lumbar puncture  9/15 EEG  9/16 EEG  9/17 EEG  9/19 overnight EEG with video  9/22 overnight EEG with video with discontinuation of long-term EEG monitoring on 9/25  9/24 core track placement  10/6 EEG  Antimicrobials:   Ceftriaxone 11/25 - 11/26  Amoxicillin 11/26>>   Subjective: Patient seen and examined bedside, her present.  Patient was apparently agitated throughout the night, currently sleeping.  Per staff, continues to be nonverbal and not appropriately responsive to appropriately to command over the past 24 hours.  No family present.  Unable to obtain any further ROS due to her nonverbal state/mental status.  Discussed with palliative care this morning.  No acute concerns per nursing staff this morning.  Continue to await placement  Objective: Vitals:   03/18/20 1651 03/18/20 2100 03/19/20 0607 03/19/20 0842  BP: 100/68 102/67 106/74 (!) 117/98  Pulse: 88 97 85 94  Resp: 18 16 18 18   Temp: 97.8 F (36.6 C) 98.4 F (36.9 C) 98.1 F (36.7 C) 97.6 F (36.4 C)  TempSrc: Oral Oral Oral Oral  SpO2: 97% 97% 98% 99%  Weight:      Height:        Intake/Output Summary (Last 24 hours) at 03/19/2020 1200 Last data filed at 03/18/2020 2200 Gross per 24 hour  Intake 355 ml  Output 0 ml  Net 355 ml   Filed Weights   03/16/20 0500 03/17/20 0412 03/18/20 0441  Weight: 69.4 kg 71.3 kg 71.3 kg    Examination:  General exam: Sleeping, nonverbal Respiratory system: Clear to auscultation. Respiratory effort normal.  Oxygenating well on room  air Cardiovascular system: S1 & S2 heard, RRR. No JVD, murmurs, rubs, gallops or clicks. No pedal edema. Gastrointestinal system: Abdomen is nondistended, soft and nontender. No organomegaly or masses felt. Normal bowel sounds heard.  PEG tube noted with binder in place. Central nervous system: Alert.  Extremities: Moves all extremities independently Skin: No rashes, lesions or ulcers Psychiatry: Nonverbal    Data Reviewed: I have personally reviewed following labs and imaging studies  CBC: Recent Labs  Lab 03/15/20 1840  WBC 5.6  NEUTROABS 2.2  HGB 14.3  HCT 42.2  MCV 94.0  PLT 398   Basic Metabolic Panel: Recent Labs  Lab 03/15/20 1840  NA 138  K 4.1  CL 103  CO2 25  GLUCOSE 94  BUN 13  CREATININE 0.49  CALCIUM 9.3   GFR: Estimated Creatinine Clearance: 96.2 mL/min (by C-G formula based on SCr of 0.49 mg/dL). Liver Function Tests: Recent Labs  Lab 03/15/20 1840  AST 138*  ALT 244*  ALKPHOS 127*  BILITOT 0.3  PROT 6.5  ALBUMIN 3.8   No results for input(s): LIPASE, AMYLASE in the last 168 hours. No results for input(s): AMMONIA in the last 168 hours. Coagulation Profile: No results for input(s): INR, PROTIME in the last 168 hours. Cardiac Enzymes: No results for input(s): CKTOTAL, CKMB, CKMBINDEX, TROPONINI in the last 168 hours. BNP (last 3 results) No results for input(s): PROBNP in the last 8760  hours. HbA1C: No results for input(s): HGBA1C in the last 72 hours. CBG: No results for input(s): GLUCAP in the last 168 hours. Lipid Profile: No results for input(s): CHOL, HDL, LDLCALC, TRIG, CHOLHDL, LDLDIRECT in the last 72 hours. Thyroid Function Tests: No results for input(s): TSH, T4TOTAL, FREET4, T3FREE, THYROIDAB in the last 72 hours. Anemia Panel: No results for input(s): VITAMINB12, FOLATE, FERRITIN, TIBC, IRON, RETICCTPCT in the last 72 hours. Sepsis Labs: No results for input(s): PROCALCITON, LATICACIDVEN in the last 168 hours.  Recent  Results (from the past 240 hour(s))  Culture, blood (Routine X 2) w Reflex to ID Panel     Status: None (Preliminary result)   Collection Time: 03/15/20  4:27 PM   Specimen: BLOOD RIGHT HAND  Result Value Ref Range Status   Specimen Description BLOOD RIGHT HAND  Final   Special Requests   Final    BOTTLES DRAWN AEROBIC ONLY Blood Culture results may not be optimal due to an inadequate volume of blood received in culture bottles   Culture   Final    NO GROWTH 4 DAYS Performed at [facility], [address] [place], [place] [address]    Report Status PENDING  Incomplete  Culture, blood (Routine X 2) w Reflex to ID Panel     Status: None (Preliminary result)   Collection Time: 03/15/20  4:30 PM   Specimen: BLOOD RIGHT HAND  Result Value Ref Range Status   Specimen Description BLOOD RIGHT HAND  Final   Special Requests   Final    BOTTLES DRAWN AEROBIC AND ANAEROBIC Blood Culture adequate volume   Culture   Final    NO GROWTH 4 DAYS Performed at [facility], [address] [place], [place] [address]    Report Status PENDING  Incomplete  Culture, Urine     Status: Abnormal (Preliminary result)   Collection Time: 03/16/20 12:27 AM   Specimen: Urine, Catheterized  Result Value Ref Range Status   Specimen Description URINE, CATHETERIZED  Final   Special Requests NONE  Final   Culture (A)  Final    >=100,000 COLONIES/mL SALMONELLA SPECIES HEALTH DEPARTMENT NOTIFIED Performed at [facility], [address] [place], [place] [address]    Report Status PENDING  Incomplete   Organism ID, Bacteria SALMONELLA SPECIES (A)  Final      Susceptibility   Salmonella species - MIC*    AMPICILLIN <=2 SENSITIVE Sensitive     LEVOFLOXACIN <=0.12 SENSITIVE Sensitive     TRIMETH/SULFA <=20 SENSITIVE Sensitive     * >=100,000 COLONIES/mL SALMONELLA SPECIES         Radiology Studies: No results found.      Scheduled Meds: . amoxicillin  500 mg Per Tube Q8H  .  Chlorhexidine Gluconate Cloth  6 each Topical Daily  . enoxaparin (LOVENOX) injection  40 mg Subcutaneous Q24H  . famotidine  20 mg Per Tube BID  . feeding supplement (OSMOLITE 1.5 CAL)  355 mL Per Tube TID  . feeding supplement (PROSource TF)  45 mL Per Tube TID  . free water  400 mL Per Tube Q6H  . Gerhardt's butt cream  1 application Topical TID  . ibuprofen  400 mg Per Tube Q8H  . lacosamide  200 mg Per Tube BID  . methocarbamol  500 mg Per Tube TID  . metroNIDAZOLE  500 mg Per Tube BID  . modafinil  100 mg Per Tube QODAY  . PHENObarbital  60 mg  Per Tube BID  . phenytoin  200 mg Per Tube BID  . potassium chloride  40 mEq Per Tube Daily  . vitamin B-6  100 mg Per Tube Daily  . sertraline  50 mg Per Tube Daily  . thiamine  100 mg Per Tube Daily  . zolpidem  10 mg Per Tube Daily   Continuous Infusions:   LOS: 73 days    Time spent: 32 minutes spent on chart review, discussion with nursing staff, consultants, updating family and interview/physical exam; more than 50% of that time was spent in counseling and/or coordination of care.    Alvira Philips [place], DO [facility] Available via Epic secure chat 7am-7pm After these hours, please refer to coverage provider listed on [email or web address] 03/19/2020, 12:00 PM

## 2020-03-19 NOTE — Progress Notes (Signed)
Spoke w/MD regarding PIV consult.  MD switching meds to IM and states pt also has a PEG tube.

## 2020-03-19 NOTE — Progress Notes (Signed)
Daily Progress Note   Patient Name: Anita Davis       Date: 03/19/2020 DOB: 1985-10-06  Age: 34 y.o. MRN#: [id number] Attending Physician: [place], Eric J, DO Primary Care Physician: [name], PA-C Admit Date: 01/04/2020  Reason for Consultation/Follow-up: To discuss complex medical decision making related to patient's goals of care  Subjective: Received a call from Anita Davis's father.  He wanted to discuss her care and next steps.  He was most concerned about visiting Anita Davis and finding her lying in urine and stool.  He expressed concern about skin breakdown.  He asked about her diagnosis and next steps.  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   Her father expressed concern that he would hold her hand and talk with her and she would suddenly burst into tears.  We talked about a brain injury being somewhat like dementia or Alzheimer's.  These patient's often have wide swings in their emotions.  He feels Anita Davis is not improving and was considering getting a second opinion at another hospital - however he did not want to move her if she was just going to go thru the same testing and diagnosis again.  He expressed concern that she would need to go to a rehab facility after the hospitalization because he is often away from home working and his wife is in her early 63s and unable to physically provide appropriate care.   Assessment: Young woman with multifactorial brain dysfunction - likely with components of anoxic injury, substance abuse disorder, and psychiatric.  Treatment for brain dysfunction seems paused as an inpatient - both  neuro and psychiatry have signed off     Patient Profile/HPI:  34 y.o. female with past medical history of substance use disorder, and active hepatitis C who was admitted on 01/04/2020 with status epilepticus.  She has been in the hospital for two weeks and has suffered persistent encephalopathy.  After being treated by neurology with anticonvulsant medications her EEG improved but her overall mental status and functionality has not.  She has been seen by psychiatry and started on valium and seroquel.   Length of Stay: 73   Vital Signs: BP (!) 117/98 (BP Location: Left Arm)  Pulse 94   Temp 97.6 F (36.4 C) (Oral)   Resp 18   Ht 5\' 5"  (1.651 m)   Wt 71.3 kg   SpO2 99%   BMI 26.16 kg/m  SpO2: SpO2: 99 % O2 Device: O2 Device: Room Air O2 Flow Rate: O2 Flow Rate (L/min): 2 L/min       Palliative Assessment/Data:  40%     Palliative Care Plan    Recommendations/Plan:  Family called and requested re-engagement  Family concerned about patient care - found patient more than once lying in urine/feces  Wonder if patient would benefit from on-going psychiatric evaluation inpatient or perhaps transfer to an inpatient psychiatric unit.  Code Status:  Full code  Prognosis:   Unable to determine   Discharge Planning:  To Be Determined  Care plan was discussed with patient's father and attending physician.  Thank you for allowing the Palliative Medicine Team to assist in the care of this patient.  Total time spent:  35 min.     Greater than 50%  of this time was spent counseling and coordinating care related to the above assessment and plan.  , PA-C Palliative Medicine  Please contact Palliative MedicineTeam phone at [phone] for questions and concerns between 7 am - 7 pm.   Please see AMION for individual provider pager numbers.

## 2020-03-19 NOTE — Progress Notes (Signed)
Im haldol 2 mg, Im ativan 2 mg and benadryl 25 mg given to patient per MD orders at 1402 for severe agitation, restlessness and anxiety.. Patient tolerated all medication well with no side effect noted. Will continue to monitor.

## 2020-03-20 LAB — CULTURE, BLOOD (ROUTINE X 2)
Culture: NO GROWTH
Culture: NO GROWTH
Special Requests: ADEQUATE

## 2020-03-20 MED ORDER — HALOPERIDOL LACTATE 5 MG/ML IJ SOLN
5.0000 mg | Freq: Four times a day (QID) | INTRAMUSCULAR | Status: DC | PRN
  Administered 2020-03-21 (×2): 5 mg via INTRAMUSCULAR
  Filled 2020-03-20 (×3): qty 1

## 2020-03-20 NOTE — Progress Notes (Signed)
At beginning of shift, Pt, became restless .agitated, and walking back and forth & pushing against the nursing staff and also trying to slide onto the floor when assisted to the bathroom to void. Prn Haldol 2 mg IM and 25 mg Benadryl  Im given with all night medications and finally pt calm down and slept till this morning. Gait very unsteady, Vs stable Incontinent of bowel and bladder. Clean and made comfortable in bed. RN will continue to monitor Pt.

## 2020-03-20 NOTE — Progress Notes (Signed)
PROGRESS NOTE    Anita Davis  TSV:[id number] DOB: Jun 26, 1985 DOA: 01/04/2020 PCP: [name], PA-C    Brief Narrative:  Anita Davis is a 34 year old female with history of substance use disorder, hepatitis C, gestational diabetes who presented to the ED with confusion.  Admitted 01/04/2020 for encephalopathy thought to be secondary to from drug overdose. LP showed no signs of infection. MRI brain unremarkable. She remained encephalopathic, and EEG showed focal seizures. The seizures were suppressed, and she was transferred to [facility] for continuous EEG, but after seizure control, she had poor neurologic recovery. 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 She deteriorated after steroids were stopped, so IVIG was started.  Since admission patient has had waxing and waning mentation ranging from completely unresponsive to quite alert, confused and verbal and able to express her needs.  Unfortunately these episodes of wakefulness are never sustained and patient will likely be struggling with abnormal mentation for a sustained period of time therefore long-term SNF recommended. 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  Treated for possible autoimmune encephalitis with steroids and IVIG with no significant improvement.  Follow-up EEG without evidence of reemergence of seizures/status epilepticus.  Patient continues to be fairly nonverbal during the day, but nursing reports she verbalizes more with conversations during the night occasionally. --Overall poor prognosis with lack of significant improvement in cognition such far during the hospitalization --Palliative care following, appreciate assistance --Vimpat 200mg  BID --Phenobarbital 60mg  BID --Dilantin 200mg  BID --Ativan, Haldol, benadryl prn  Vulvovaginal candidiasis Bacterial vaginosis --Received dose of Diflucan 150 mg on 11/24 --Flagyl 500 mg twice daily x7 days  Salmonella urinary tract infection Urinalysis 11/24 with moderate leukoesterase, negative nitrite, 6/10 WBCs.  Urine culture positive for greater than 100 K GNR's.  Urine culture positive for Salmonella. --Continue amoxicillin via tube per pharmacy/urine culture susceptibilities  Chronic depression Currently on Zoloft taper. --Continue Zoloft; decrease to 25 mg on 11/28 x 3 days and then dc  Dysphagia with persistent inadequate oral intake Has been followed by speech therapy, dysphagia has improved with recommendations of regular diet with thin liquids, but oral intake has been waxing and waning given her alertness. --Continue tube feeds  Chronic hepatitis C Elevated HCV antibody with markedly elevated HCV RNA quantitative level consistent with chronic hepatitis C.  Case was discussed with infectious disease, Dr. 12/24; with recommendation of outpatient referral closer to discharge.  Severe protein calorie malnutrition Body mass index is 26.16 kg/m. Nutrition Status: Nutrition Problem: Inadequate oral intake Etiology: lethargy/confusion Signs/Symptoms: meal completion < 25% Interventions: Tube feeding, Prostat --continue tube  feeds, dietitian following    DVT prophylaxis: Lovenox Code Status: Full code Family Communication: No family present at bedside  Disposition Plan:  Status is:  Inpatient  Remains inpatient appropriate because:Unsafe d/c plan   Dispo: The patient is from: Home              Anticipated d/c is to: SNF              Anticipated d/c date is: 1 day              Patient currently is medically stable to d/c.   Consultants:   Neurology  Psychiatry  Interventional radiology  Surgery  Procedures:   9/14 lumbar puncture  9/15 EEG  9/16 EEG  9/17 EEG  9/19 overnight EEG with video  9/22 overnight EEG with video with discontinuation of long-term EEG monitoring on 9/25  9/24 core track placement  10/6 EEG  Antimicrobials:   Ceftriaxone 11/25 - 11/26  Amoxicillin 11/26>>   Subjective: Patient seen and examined bedside, sleeping.  Significant agitation overnight requiring Benadryl, Haldol and Ativan, now calm.  Call from nursing this morning stating that she has continued agitation and requesting further restraints with "enclosed" bed.  No family present.  Seen by palliative care yesterday.  No acute concerns per nursing staff this morning.  Continue to await placement; which remains difficult.  Objective: Vitals:   03/19/20 2126 03/19/20 2348 03/20/20 0330 03/20/20 0820  BP: 114/72 102/66 100/69 97/67  Pulse: (!) 101 77 68 73  Resp:  16 18 18   Temp:  97.9 F (36.6 C) 97.6 F (36.4 C) 98.8 F (37.1 C)  TempSrc:  Oral Oral Oral  SpO2: 99% 98% 99% 100%  Weight:      Height:       No intake or output data in the 24 hours ending 03/20/20 1211 Filed Weights   03/16/20 0500 03/17/20 0412 03/18/20 0441  Weight: 69.4 kg 71.3 kg 71.3 kg    Examination:  General exam: Sleeping, nonverbal Respiratory system: Clear to auscultation. Respiratory effort normal.  Oxygenating well on room air Cardiovascular system: S1 & S2 heard, RRR. No JVD, murmurs, rubs, gallops or  clicks. No pedal edema. Gastrointestinal system: Abdomen is nondistended, soft and nontender. No organomegaly or masses felt. Normal bowel sounds heard.  PEG tube noted with binder in place. Central nervous system: Alert.  Extremities: Moves all extremities independently Skin: No rashes, lesions or ulcers Psychiatry: Nonverbal    Data Reviewed: I have personally reviewed following labs and imaging studies  CBC: Recent Labs  Lab 03/15/20 1840  WBC 5.6  NEUTROABS 2.2  HGB 14.3  HCT 42.2  MCV 94.0  PLT 398   Basic Metabolic Panel: Recent Labs  Lab 03/15/20 1840  NA 138  K 4.1  CL 103  CO2 25  GLUCOSE 94  BUN 13  CREATININE 0.49  CALCIUM 9.3   GFR: Estimated Creatinine Clearance: 96.2 mL/min (by C-G formula based on SCr of 0.49 mg/dL). Liver Function Tests: Recent Labs  Lab 03/15/20 1840  AST 138*  ALT 244*  ALKPHOS 127*  BILITOT 0.3  PROT 6.5  ALBUMIN 3.8   No results for input(s): LIPASE, AMYLASE in the last 168 hours. No results for input(s): AMMONIA in the last 168 hours. Coagulation Profile: No results for input(s): INR, PROTIME in the last 168 hours. Cardiac Enzymes: No results for input(s): CKTOTAL, CKMB, CKMBINDEX, TROPONINI in the last 168 hours. BNP (last 3 results) No results for input(s): PROBNP in the last 8760 hours. HbA1C: No results for input(s): HGBA1C in the last 72 hours. CBG: No results for input(s): GLUCAP in the last 168 hours.  Lipid Profile: No results for input(s): CHOL, HDL, LDLCALC, TRIG, CHOLHDL, LDLDIRECT in the last 72 hours. Thyroid Function Tests: No results for input(s): TSH, T4TOTAL, FREET4, T3FREE, THYROIDAB in the last 72 hours. Anemia Panel: No results for input(s): VITAMINB12, FOLATE, FERRITIN, TIBC, IRON, RETICCTPCT in the last 72 hours. Sepsis Labs: No results for input(s): PROCALCITON, LATICACIDVEN in the last 168 hours.  Recent Results (from the past 240 hour(s))  Culture, blood (Routine X 2) w Reflex to ID  Panel     Status: None   Collection Time: 03/15/20  4:27 PM   Specimen: BLOOD RIGHT HAND  Result Value Ref Range Status   Specimen Description BLOOD RIGHT HAND  Final   Special Requests   Final    BOTTLES DRAWN AEROBIC ONLY Blood Culture results may not be optimal due to an inadequate volume of blood received in culture bottles   Culture   Final    NO GROWTH 5 DAYS Performed at [facility], [address], [place], [place] [address]    Report Status 03/20/2020 FINAL  Final  Culture, blood (Routine X 2) w Reflex to ID Panel     Status: None   Collection Time: 03/15/20  4:30 PM   Specimen: BLOOD RIGHT HAND  Result Value Ref Range Status   Specimen Description BLOOD RIGHT HAND  Final   Special Requests   Final    BOTTLES DRAWN AEROBIC AND ANAEROBIC Blood Culture adequate volume   Culture   Final    NO GROWTH 5 DAYS Performed at [facility], [address] [place], [place] [address]    Report Status 03/20/2020 FINAL  Final  Culture, Urine     Status: Abnormal (Preliminary result)   Collection Time: 03/16/20 12:27 AM   Specimen: Urine, Catheterized  Result Value Ref Range Status   Specimen Description URINE, CATHETERIZED  Final   Special Requests NONE  Final   Culture (A)  Final    >=100,000 COLONIES/mL SALMONELLA SPECIES HEALTH DEPARTMENT NOTIFIED Performed at [facility], [address], [place], [place] [address]    Report Status PENDING  Incomplete   Organism ID, Bacteria SALMONELLA SPECIES (A)  Final      Susceptibility   Salmonella species - MIC*    AMPICILLIN <=2 SENSITIVE Sensitive     LEVOFLOXACIN <=0.12 SENSITIVE Sensitive     TRIMETH/SULFA <=20 SENSITIVE Sensitive     * >=100,000 COLONIES/mL SALMONELLA SPECIES         Radiology Studies: No results found.      Scheduled Meds: . amoxicillin  500 mg Per Tube Q8H  . Chlorhexidine Gluconate Cloth  6 each Topical Daily  . enoxaparin (LOVENOX) injection  40 mg Subcutaneous Q24H  .  famotidine  20 mg Per Tube BID  . feeding supplement (OSMOLITE 1.5 CAL)  355 mL Per Tube TID  . feeding supplement (PROSource TF)  45 mL Per Tube TID  . free water  400 mL Per Tube Q6H  . Gerhardt's butt cream  1 application Topical TID  . lacosamide  200 mg Per Tube BID  . LORazepam  2 mg Intramuscular Once  . methocarbamol  500 mg Per Tube TID  . metroNIDAZOLE  500 mg Per Tube BID  . modafinil  100 mg Per Tube QODAY  . PHENObarbital  60 mg Per Tube BID  . phenytoin  200 mg Per Tube BID  . potassium chloride  40 mEq Per Tube Daily  . vitamin B-6  100 mg Per Tube Daily  . sertraline  25 mg Per Tube Daily  . thiamine  100 mg Per Tube Daily  . zolpidem  10 mg Per Tube Daily   Continuous Infusions:   LOS: 74 days    Time spent: 32 minutes spent on chart review, discussion with nursing staff, consultants, updating family and interview/physical exam; more than 50% of that time was spent in counseling and/or coordination of care.    Alvira Philips [place], DO [facility] Available via Epic secure chat 7am-7pm After these hours, please refer to coverage provider listed on [email or web address] 03/20/2020, 12:11 PM
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
## 2020-03-21 MED ORDER — QUETIAPINE FUMARATE 25 MG PO TABS
12.5000 mg | ORAL_TABLET | ORAL | Status: DC
  Administered 2020-03-21 – 2020-03-22 (×2): 12.5 mg via ORAL
  Filled 2020-03-21 (×2): qty 1

## 2020-03-21 MED ORDER — IBUPROFEN 100 MG/5ML PO SUSP
800.0000 mg | Freq: Once | ORAL | Status: AC
  Administered 2020-03-21: 800 mg
  Filled 2020-03-21: qty 40

## 2020-03-21 NOTE — Progress Notes (Signed)
This chaplain responded to PMT consult for spiritual care.  The chaplain reviewed the chart notes and checked in with the Pt. RN-Sarah before the visit.  The Pt is awake and eating a burger and fries with her step mom-[name]. The Pt. replies with [name]'s name when the chaplain asks who is beside her. The Pt. follows the movement in the room and in the halls while [place] and the chaplain talk to each other about the Pt. choice to eat and [name]'s spirituality. The chaplain re-directs the conversation to the Pt., but the Pt. doesn't respond.  The Pt. responds with tearful distress when [name] plays one of the Pt. favorite songs.  The Pt. is  accepting of [name]'s touch, in [name]'s attempt to comfort the Pt. [name] is accepting of the chaplain's personal prayer.

## 2020-03-21 NOTE — Plan of Care (Signed)
  Problem: Clinical Measurements: Goal: Will remain free from infection Outcome: Progressing Goal: Diagnostic test results will improve Outcome: Progressing Goal: Cardiovascular complication will be avoided Outcome: Progressing   Problem: Nutrition: Goal: Adequate nutrition will be maintained Outcome: Progressing   
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
## 2020-03-21 NOTE — Progress Notes (Signed)
Nutrition Follow-up  DOCUMENTATION CODES:   Not applicable  INTERVENTION:  Continue bolus TF regimen via PEG: -Provide 1.5 cartons (351m) of Osmolite 1.5 cal TID with 645mfree water flushes before and after each bolus -4524mrosource TF TID -Additional 400m79mee water flush Q6H (per MD)  TF regimen provides 1716 kcals (meets 95% of minimum estimated needs), 99 grams of protein, 1173ml3me water (2774ml 49m water)   Staff should continue to encourage PO intake and should assist pt with meals as necessary.   NUTRITION DIAGNOSIS:   Inadequate oral intake related to lethargy/confusion as evidenced by meal completion < 25%.  ongoing  GOAL:   Patient will meet greater than or equal to 90% of their needs  Met with TF  MONITOR:   TF tolerance, Labs, Weight trends, I & O's  REASON FOR ASSESSMENT:   Consult Enteral/tube feeding initiation and management  ASSESSMENT:   Pt with refractory seizures with acute metabolic encephalopathy. PMH includes hepatitis and polysubstance abuse.  9/24 Cortrak placed (gastric) 10/10 Cortrak dislodged, TF held 10/11 Cortrak advanced (tip in proximal duodenum per KUB), TF restarted 10/14 TF reduced to 30ml/h63me to colonic ileus 10/15 G-tube placement 10/16 TF resumed 10/18 TF rate returned to 65ml/hr98mtube pulled out 1 inch so TF held 10/21 G-tube replaced 10/27 diet advanced to regular  Discussed pt with RN. Pt continues to take very little po and is receiving bolus TF via PEG. Current regimen is 1.5 cartons (355ml) of59molite 1.5 cal via PEG TID, (flush with 60ml free61mer before and after each bolus), 45ml Proso51m TF TID and an additional 400ml free w28m Q6H (per MD)  Admit wt: 74.8 kg Current wt: 71.3 kg  No UOP documented.  Labs reviewed. Medications: pepcid, klor-con, vitamin B6, thiamine  Diet Order:   Diet Order            Diet regular Room service appropriate? Yes; Fluid consistency: Thin  Diet effective  now                 EDUCATION NEEDS:   No education needs have been identified at this time  Skin:  Skin Assessment: Reviewed RN Assessment Skin Integrity Issues:: Incisions Incisions: abdomen  Last BM:  11/28  Height:   Ht Readings from Last 1 Encounters:  01/06/20 5' 5"  (1.651 m)    Weight:   Wt Readings from Last 1 Encounters:  03/18/20 71.3 kg     BMI:  Body mass index is 26.16 kg/m.  Estimated Nutritional Needs:   Kcal:  1800-2000  Protein:  90-100 grams  Fluid:  >/=1.8L/d    Eleanna AvereLarkin InaN RD pager number and weekend/on-call pager number located in [email or web address]

## 2020-03-22 ENCOUNTER — Inpatient Hospital Stay (HOSPITAL_COMMUNITY): Payer: Medicaid Other

## 2020-03-22 MED ORDER — LORAZEPAM 2 MG/ML IJ SOLN
2.0000 mg | INTRAMUSCULAR | Status: DC | PRN
  Administered 2020-03-22 (×2): 2 mg via INTRAMUSCULAR
  Filled 2020-03-22 (×4): qty 1

## 2020-03-22 MED ORDER — DIPHENHYDRAMINE HCL 50 MG/ML IJ SOLN
25.0000 mg | Freq: Four times a day (QID) | INTRAMUSCULAR | Status: DC | PRN
  Administered 2020-03-22: 25 mg via INTRAMUSCULAR
  Filled 2020-03-22: qty 1

## 2020-03-22 MED ORDER — DIPHENHYDRAMINE HCL 25 MG PO CAPS
25.0000 mg | ORAL_CAPSULE | Freq: Four times a day (QID) | ORAL | Status: DC | PRN
  Administered 2020-03-23: 25 mg
  Filled 2020-03-22 (×2): qty 1

## 2020-03-22 NOTE — Progress Notes (Signed)
Palliative Medicine RN Note: Rec'd a call from pt's step mother [name], who was present with our chaplain yesterday. She has concerns that pt's aunt [name] may have incorrectly completed some of the patient's disability applications and wished to clarify who the allowed contacts are per pt's father.  Confirmed that father Dannielle Huh, step mother [name], and aunt [name] are allowed. Removed [name]. Made note that aunt [name] is NOT to be allowed to have discussions with medical team.   Also, please note that pt's mother has been deceased for five years; she has a step-mother and aunts only.  I called [place] She will reach out to [employer] and ask them to call [name] to make sure that applications are completed appropriately for [facility].  Margret Chance Jabori Henegar, RN, BSN, [facility] Palliative Medicine Team 03/22/2020 11:47 AM Office [phone]

## 2020-03-22 NOTE — Progress Notes (Signed)
STAT EEG complete - results pending. ? ?
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
## 2020-03-22 NOTE — Progress Notes (Signed)
Physical Therapy Treatment [name] MRN: [id number] DOB: Jul 19, 1985 Today's Date: 03/22/2020    History of Present Illness 34 year old with past medical history significant for hepatitis C, polysubstance abuse brought to the hospital 9/13 for disorientation which was suspected to be substance abuse.  After she became more lucid, she was discharged, but then police brought her back later in the day and they found her passed out in a parking lot.  Urine drug screen was positive for benzodiazepine and THC. An EEG done on 9/15 showed patient was in status epilepticus.  Patient was a started on Keppra.  Despite these, EEG noted continued seizures requiring additional medications as well.  Finally on 9/18, seizures broke. Follow-up EEG on 9/20 noted evidence of epilepticity from the left central temporal region.  Repeat EEG done on 9/22 noted epileptogenicity from the left central temporal region.  Pt with PEG placed on 02/05/20.     PT Comments    Pt has made progress compared to her previous PT session as she was less impulsive and agitated this date and able to participate more. She continues to be non-verbal and unable to follow verbal commands, but does actively assist with tasks when provided physical initiation. She required modAx2 to come to sit EOB, maxAx2 to return to supine, modAx2 to come to stand, and modAx2 to ambulate x3 bouts of ~3 ft > ~8 ft > ~10 ft with 2 HHA within the room. She was able to ambulate with increased leg advancement initiation and less cues when the pt had her hands anteriorly placed on the rehab tech who was ambulating backwards while the therapist was providing cues to her legs and hips and stability to her trunk. Will continue to follow acutely. Current recommendations remain appropriate. Goals were re-assessed and updated appropriately.    Follow Up Recommendations  SNF;Supervision/Assistance - 24 hour     Equipment Recommendations   Wheelchair (measurements PT);Wheelchair cushion (measurements PT);Hospital bed;Other (comment);3in1 (PT) (mechanical lift on occasion)    Recommendations for Other Services       Precautions / Restrictions Precautions Precautions: Fall Precaution Comments: Seizure, PEG, abdominal binder, wrist restraints Restrictions Weight Bearing Restrictions: No    Mobility  Bed Mobility Overal bed mobility: Needs Assistance Bed Mobility: Supine to Sit;Sit to Supine     Supine to sit: HOB elevated;Mod assist;+2 for physical assistance Sit to supine: Max assist;+2 for physical assistance   General bed mobility comments: HHA and tactile and verbal cues to ascend trunk and bring legs off EOB, with pt actively assisting with trunk ascension after physically being initiated. Return to supine with maxAx2 to manage trunk and legs.  Transfers Overall transfer level: Needs assistance Equipment used: 2 person hand held assist Transfers: Sit to/from Stand Sit to Stand: Mod assist;+2 physical assistance         General transfer comment: 2 HHA and physical assistance at buttocks and trunk to initiate transfer to stand, with pt activating leg muscles after initiation physically provided. Unsteadiness noted.  Ambulation/Gait Ambulation/Gait assistance: Mod assist;+2 physical assistance Gait Distance (Feet): 10 Feet (x3 bouts of ~3 ft > ~8 ft > ~10 ft) Assistive device: 2 person hand held assist Gait Pattern/deviations: Decreased stride length;Shuffle;Scissoring;Staggering right;Narrow base of support Gait velocity: decreased Gait velocity interpretation: <1.31 ft/sec, indicative of household ambulator General Gait Details: Pt ambulates with L leg internally rotated and with decreased initiation of L leg step compared to R, requiring tactile cues at hamstrings to initiate swing phase bilat but  progressed to less cues with subsequent bouts. Required tactile and verbal cues to shift weight for steps. 2 HHA  either 1 hand on therapist and 1 hand on tech or 2 hands anteriorly on tech walking backwards and therapist providing cues at hips and legs.   Stairs             Wheelchair Mobility    Modified Rankin (Stroke Patients Only)       Balance Overall balance assessment: Needs assistance Sitting-balance support: Feet supported;Bilateral upper extremity supported Sitting balance-Leahy Scale: Poor Sitting balance - Comments: Static sitting EOB with bilat UE support and min guard assist.   Standing balance support: During functional activity;Bilateral upper extremity supported Standing balance-Leahy Scale: Poor Standing balance comment: Standing with bilat HHA.                            Cognition Arousal/Alertness: Awake/alert Behavior During Therapy: Flat affect Overall Cognitive Status: Difficult to assess Area of Impairment: Attention;Following commands;Safety/judgement;Awareness;Problem solving                   Current Attention Level: Divided   Following Commands: Follows one step commands inconsistently;Follows one step commands with increased time (requires physical assistance/cues to initiate) Safety/Judgement: Decreased awareness of safety;Decreased awareness of deficits Awareness: Intellectual Problem Solving: Slow processing;Decreased initiation;Difficulty sequencing;Requires verbal cues;Requires tactile cues General Comments: Requires extensive physical cues/assistance to initiate all tasks with pt then actively assisting. However, unable to follow any verbal cues alone. Pt unaware of safety issues as she tends to try to scoot inferiorly out of bed or chair.      Exercises      General Comments        Pertinent Vitals/Pain Pain Assessment: Faces Faces Pain Scale: No hurt Pain Intervention(s): Monitored during session;Limited activity within patient's tolerance;Repositioned    Home Living                      Prior Function             PT Goals (current goals can now be found in the care plan section) Acute Rehab PT Goals Patient Stated Goal: pt unable to state PT Goal Formulation: Patient unable to participate in goal setting Time For Goal Achievement: 04/08/20 Potential to Achieve Goals: Fair Progress towards PT goals: Progressing toward goals    Frequency    Min 2X/week      PT Plan Current plan remains appropriate    Co-evaluation              AM-PAC PT "6 Clicks" Mobility   Outcome Measure  Help needed turning from your back to your side while in a flat bed without using bedrails?: A Lot Help needed moving from lying on your back to sitting on the side of a flat bed without using bedrails?: A Lot Help needed moving to and from a bed to a chair (including a wheelchair)?: A Lot Help needed standing up from a chair using your arms (e.g., wheelchair or bedside chair)?: A Lot Help needed to walk in hospital room?: A Lot Help needed climbing 3-5 steps with a railing? : Total 6 Click Score: 11    End of Session Equipment Utilized During Treatment: Gait belt Activity Tolerance: Patient tolerated treatment well Patient left: in bed;with call bell/phone within reach;with bed alarm set;with nursing/sitter in room;with restraints reapplied Nurse Communication: Mobility status PT Visit Diagnosis: Unsteadiness on feet (R26.81);Muscle  weakness (generalized) (M62.81);Difficulty in walking, not elsewhere classified (R26.2);Other symptoms and signs involving the nervous system (R29.898);Other abnormalities of gait and mobility (R26.89)     Time: 1000-1026 PT Time Calculation (min) (ACUTE ONLY): 26 min  Charges:  $Gait Training: 8-22 mins $Therapeutic Activity: 8-22 mins                     Raymond Gurney, PT, DPT Acute Rehabilitation Services  Pager: [phone] Office: [phone]    Jewel Baize 03/22/2020, 1:32 PM

## 2020-03-22 NOTE — Progress Notes (Signed)
Restraints renewed as pt continues to be trying to get out of bed, confused, spitting at staff and trying to bite nurses

## 2020-03-22 NOTE — Procedures (Signed)
Patient Name: Arla Boutwell  MRN: [id number]  Epilepsy Attending: Charlsie Quest  Referring Physician/Provider: Dr Eric [place] Date: 03/22/2020 Duration: 26.30 mins  Patient history: 34yo f with prior seizures, persistent ams. EEG to evaluate for seizure  Level of alertness: Awake  AEDs during EEG study: LCM, PB, PHT  Technical aspects: This EEG study was done with scalp electrodes positioned according to the 10-20 International system of electrode placement. Electrical activity was acquired at a sampling rate of 500Hz  and reviewed with a high frequency filter of 70Hz  and a low frequency filter of 1Hz . EEG data were recorded continuously and digitally stored.   Description: No clear posterior dominant rhythm was seen. There is an excessive amount of 15 to 18 Hz beta activity distributed symmetrically and diffusely. Patient was noted to have episodes of whole body shaking. Concomitant eeg before during and after the event didn't show any eeg change to suggest seizure. Hyperventilation and photic stimulation were not performed.     ABNORMALITY -Excessive beta, generalized  IMPRESSION: This study is within normal limits. No seizures or epileptiform discharges were seen throughout the recording.  Patient was noted to have episode of whole body shaking without concomitant eeg change and is NOT epileptic.   Facundo Allemand 

## 2020-03-22 NOTE — TOC Progression Note (Addendum)
Transition of Care [facility]) - Progression Note    Patient Details  Name: Kynnedi Zweig MRN: [id number] Date of Birth: October 25, 1985  [facility]) CM/SW Contact  Mearl Latin, LCSW Phone Number: 03/22/2020, 11:18 AM  Clinical Narrative:    No SNF bed offers for patient at this time. Medicaid and Disability applications are still pending.  CSW notified Cathlean Marseilles with [employer] that patient's stepmother [name] should be their primary contact and to make sure information had been submitted correctly on the Medicaid/Disability applications. In the notes from [employer], [name] did identify herself as the patient's Aunt.   CSW will continue to follow.   Expected Discharge Plan: Skilled Nursing Facility Barriers to Discharge: Continued Medical Work up, Inadequate or no insurance, Active Substance Use - Placement, SNF Pending payor source - LOG, SNF Pending Medicaid, SNF Pending bed offer  Expected Discharge Plan and Services Expected Discharge Plan: Skilled Nursing Facility In-house Referral: Financial Counselor   Post Acute Care Choice: Skilled Nursing Facility Living arrangements for the past 2 months: Mobile Home                                       Social Determinants of Health (SDOH) Interventions    Readmission Risk Interventions No flowsheet data found.

## 2020-03-23 MED ORDER — HYDROCORT-PRAMOXINE (PERIANAL) 1-1 % EX FOAM
1.0000 | Freq: Two times a day (BID) | CUTANEOUS | Status: DC
  Administered 2020-03-23 – 2020-04-05 (×19): 1 via RECTAL
  Filled 2020-03-23 (×7): qty 10

## 2020-03-23 MED ORDER — CYCLOBENZAPRINE HCL 10 MG PO TABS
5.0000 mg | ORAL_TABLET | Freq: Three times a day (TID) | ORAL | Status: DC
  Administered 2020-03-23 – 2020-03-30 (×23): 5 mg
  Filled 2020-03-23 (×24): qty 1

## 2020-03-23 MED ORDER — QUETIAPINE FUMARATE 25 MG PO TABS: 25.0000 mg | ORAL_TABLET | ORAL | Status: DC

## 2020-03-23 MED ORDER — LORAZEPAM 2 MG/ML IJ SOLN
1.0000 mg | Freq: Once | INTRAMUSCULAR | Status: DC
Start: 2020-03-23 — End: 2020-03-23

## 2020-03-23 MED ORDER — HYDROXYZINE HCL 50 MG/ML IM SOLN
25.0000 mg | Freq: Once | INTRAMUSCULAR | Status: AC
  Administered 2020-03-23: 25 mg via INTRAMUSCULAR
  Filled 2020-03-23: qty 0.5

## 2020-03-23 MED ORDER — DICLOFENAC SODIUM 1 % EX GEL
4.0000 g | Freq: Four times a day (QID) | CUTANEOUS | Status: DC
  Administered 2020-03-23 – 2020-06-05 (×92): 4 g via TOPICAL
  Filled 2020-03-23 (×6): qty 100

## 2020-03-23 MED ORDER — HALOPERIDOL LACTATE 5 MG/ML IJ SOLN
2.0000 mg | Freq: Once | INTRAMUSCULAR | Status: AC
  Administered 2020-03-23: 2 mg via INTRAVENOUS
  Filled 2020-03-23: qty 1

## 2020-03-23 MED ORDER — LORAZEPAM 2 MG/ML IJ SOLN
1.0000 mg | Freq: Once | INTRAMUSCULAR | Status: AC
  Administered 2020-03-23: 1 mg via INTRAVENOUS

## 2020-03-23 MED ORDER — NALTREXONE HCL 50 MG PO TABS
25.0000 mg | ORAL_TABLET | Freq: Every day | ORAL | Status: DC
  Administered 2020-03-23 – 2020-03-25 (×3): 25 mg
  Filled 2020-03-23 (×4): qty 1

## 2020-03-23 MED ORDER — HALOPERIDOL LACTATE 5 MG/ML IJ SOLN
5.0000 mg | Freq: Once | INTRAMUSCULAR | Status: AC
  Administered 2020-03-23: 5 mg via INTRAMUSCULAR
  Filled 2020-03-23: qty 1

## 2020-03-23 NOTE — Progress Notes (Signed)
Occupational Therapy Treatment Patient Details Name: Nyhla Davis MRN: [id number] DOB: June 01, 1985 Today's Date: 03/23/2020    History of present illness 34 year old with past medical history significant for hepatitis C, polysubstance abuse brought to the hospital 9/13 for disorientation which was suspected to be substance abuse.  After she became more lucid, she was discharged, but then police brought her back later in the day and they found her passed out in a parking lot.  Urine drug screen was positive for benzodiazepine and THC. An EEG done on 9/15 showed patient was in status epilepticus.  Patient was a started on Keppra.  Despite these, EEG noted continued seizures requiring additional medications as well.  Finally on 9/18, seizures broke. Follow-up EEG on 9/20 noted evidence of epilepticity from the left central temporal region.  Repeat EEG done on 9/22 noted epileptogenicity from the left central temporal region.  Pt with PEG placed on 02/05/20.    OT comments  Upon arrival pt very restless in the bed in restraints so OT/PT attempting to transfer patient to help with behavior as pt is very internally distracted at this time. Pt does not follow commands for safety while supine. Pt fixated on thrusting movement of pelvic area while in bed, in chair, and on commode. Pt perseverating on movement and unable to reposition to terminate the behavior. Pt noted to have redness at peg area and abdominal binder being a poor fit. Pt with movements in the bed that causes the abdominal binder to rub against skin which is a skin integrity concern. Recommendation SNF at this time.   Follow Up Recommendations  SNF;Supervision/Assistance - 24 hour    Equipment Recommendations  3 in 1 bedside commode;Wheelchair (measurements OT);Wheelchair cushion (measurements OT);Hospital bed    Recommendations for Other Services Speech consult    Precautions / Restrictions Precautions Precautions: Fall Precaution  Comments: Seizure, PEG, abdominal binder, wrist restraints Restrictions Weight Bearing Restrictions: No       Mobility Bed Mobility Overal bed mobility: Needs Assistance Bed Mobility: Rolling;Sidelying to Sit;Supine to Sit;Sit to Supine;Sit to Sidelying Rolling: Min guard Sidelying to sit: Min guard Supine to sit: Min guard Sit to supine: Min guard Sit to sidelying: Min guard General bed mobility comments: pt very restless in the bed and frequently repositioning her self toward the eob and at times to complete same behavioral pelvic movements. pt requires total +2 max (A) at times due to safety.   Transfers Overall transfer level: Needs assistance Equipment used: 2 person hand held assist Transfers: Sit to/from Stand Sit to Stand: +2 physical assistance;Min assist         General transfer comment: 2 HHA and min-modAx2 to power up to stand, providing tactile cues to initiate.    Balance Overall balance assessment: Needs assistance Sitting-balance support: Feet supported;Bilateral upper extremity supported Sitting balance-Leahy Scale: Poor Sitting balance - Comments: Static sitting EOB with bilat UE support and min guard assist when pt not impulsively trying to slide out of bed, otherwise up to maxAx2 to block her safely.   Standing balance support: During functional activity;Bilateral upper extremity supported Standing balance-Leahy Scale: Poor Standing balance comment: requires hand held (A) to keep pt focused on transfer                           ADL either performed or assessed with clinical judgement   ADL Overall ADL's : Needs assistance/impaired  Toilet Transfer: +2 for physical assistance;Ambulation;Moderate assistance Toilet Transfer Details (indicate cue type and reason): requries redirection and hand over hand placement for grab bars to maximize safety.          Functional mobility during ADLs: +2 for physical  assistance;Moderate assistance General ADL Comments: pt currently extremely restless and internally distracted. Question reason for patient making pelvic thrusting motions on bed toilet and in quad position on the bed. pt consistently continues to demonstrate this behavior and moaning at times. pt perseverating on behavior even with redirection and movement.      Vision       Perception     Praxis      Cognition Arousal/Alertness: Awake/alert Behavior During Therapy: Restless;Impulsive Overall Cognitive Status: Difficult to assess Area of Impairment: Attention;Following commands;Safety/judgement;Awareness;Problem solving                   Current Attention Level: Focused   Following Commands: Follows one step commands inconsistently (requires tactile cues) Safety/Judgement: Decreased awareness of safety;Decreased awareness of deficits Awareness: Intellectual Problem Solving: Slow processing;Decreased initiation;Difficulty sequencing;Requires verbal cues;Requires tactile cues General Comments: pt requires max cues at all times during session as pt is very internally distracted. pt demonstrates grinding pelvic motions and scooting gentials against bed surface despite cues and reorientation to location hospital. pt voidding bowels and unaware of incontinence. pt transferred to toilet without void noted        Exercises Exercises:  (see General Comments section)   Shoulder Instructions       General Comments pt requires restraints of bil wrist at this time with RN present. pt does appear to tolerate supine at the time of end of session but due to impulsive behavior uncertain as to how long pt sustained    Pertinent Vitals/ Pain       Pain Assessment: Faces Faces Pain Scale: Hurts whole lot Pain Location: pt groaning, crying, and demonstrates movement that suggest discomfort/ changes in sensation to genital region Pain Descriptors / Indicators:  Grimacing;Crying;Moaning Pain Intervention(s): Monitored during session;Premedicated before session;Repositioned;Other (comment) (RN present due to safety concerns for patient)  Home Living                                          Prior Functioning/Environment              Frequency  Min 2X/week        Progress Toward Goals  OT Goals(current goals can now be found in the care plan section)  Progress towards OT goals: Not progressing toward goals - comment  Acute Rehab OT Goals Patient Stated Goal: none stated OT Goal Formulation: Patient unable to participate in goal setting Time For Goal Achievement: 04/06/20 Potential to Achieve Goals: Fair ADL Goals Pt Will Perform Eating: sitting;with min assist Pt Will Perform Grooming: sitting;with min assist Additional ADL Goal #1: pt will demonstrate 2 greeting gestures to staff appropriately mod I   Plan Discharge plan remains appropriate    Co-evaluation    PT/OT/SLP Co-Evaluation/Treatment: Yes Reason for Co-Treatment: Necessary to address cognition/behavior during functional activity;To address functional/ADL transfers;For patient/therapist safety PT goals addressed during session: Mobility/safety with mobility;Balance;Strengthening/ROM OT goals addressed during session: ADL's and self-care;Proper use of Adaptive equipment and DME      AM-PAC OT "6 Clicks" Daily Activity     Outcome Measure   Help from another person eating meals?:  A Lot Help from another person taking care of personal grooming?: A Lot Help from another person toileting, which includes using toliet, bedpan, or urinal?: A Lot Help from another person bathing (including washing, rinsing, drying)?: A Lot Help from another person to put on and taking off regular upper body clothing?: A Lot Help from another person to put on and taking off regular lower body clothing?: A Lot 6 Click Score: 12    End of Session    OT Visit Diagnosis:  Unsteadiness on feet (R26.81);Muscle weakness (generalized) (M62.81);Pain;Other symptoms and signs involving the nervous system (R29.898);Other symptoms and signs involving cognitive function;Cognitive communication deficit (R41.841);Ataxia, unspecified (R27.0);Feeding difficulties (R63.3);Other abnormalities of gait and mobility (R26.89)   Activity Tolerance Patient tolerated treatment well   Patient Left in bed;with call bell/phone within reach;with bed alarm set;with nursing/sitter in room;with restraints reapplied;Other (comment) (Rn contacting MD and additional RN staff to help)   Nurse Communication Mobility status;Precautions        Time: [phone] OT Time Calculation (min): 12 min  Charges: OT General Charges $OT Visit: 1 Visit OT Treatments $Self Care/Home Management : 8-22 mins   Brynn, OTR/L  Acute Rehabilitation Services Pager: [phone] Office: [phone] .    Mateo Flow 03/23/2020, 10:37 AM

## 2020-03-23 NOTE — Progress Notes (Signed)
Physical Therapy Treatment [name] MRN: [id number] DOB: 12-15-1985 Today's Date: 03/23/2020    History of Present Illness 34 year old with past medical history significant for hepatitis C, polysubstance abuse brought to the hospital 9/13 for disorientation which was suspected to be substance abuse.  After she became more lucid, she was discharged, but then police brought her back later in the day and they found her passed out in a parking lot.  Urine drug screen was positive for benzodiazepine and THC. An EEG done on 9/15 showed patient was in status epilepticus.  Patient was a started on Keppra.  Despite these, EEG noted continued seizures requiring additional medications as well.  Finally on 9/18, seizures broke. Follow-up EEG on 9/20 noted evidence of epilepticity from the left central temporal region.  Repeat EEG done on 9/22 noted epileptogenicity from the left central temporal region.  Pt with PEG placed on 02/05/20.     PT Comments    Was not planning on treating pt this date, but upon passing pt's room alarms were sounding and thus checked in with RN's exhibiting difficulty with calming pt and maintaining pt safety due to pt's impulsivity and agitation in attempting to throw herself from bed. Thus, provided assistance to RNs to maintain staff and pt safety. Pt actively performed all limb strengthening movements through crawling in bed, trying to come to stand in bed, and transitioning to/from the following positions continuously: supine, sit, sidelying, rolling, half-kneel, tall-kneel, quadriped, and intermittent standing. Pt able to perform all bed mob with min guard assist but when trying to throw herself from bed she required maxAx2 to return to sit or supine to maintain her safety. Ambulated x4 bouts of ~21ft to/from bathroom with 2 HHA and modAx2 with assistance from RNs or OT, cuing L leg to advance and improve neutral rotation. Pt exhibited possible sexual  movements in bed or movements to try to alleviate pain/itching from hernia, but this was unclear. Once RNs expressed feeling comfortable with monitoring pt with restraints re-applied and medication provided, completed session. Will continue to follow acutely. Current recommendations remain appropriate.  Follow Up Recommendations  SNF;Supervision/Assistance - 24 hour     Equipment Recommendations  Wheelchair (measurements PT);Wheelchair cushion (measurements PT);Hospital bed;Other (comment);3in1 (PT) (mechanical lift on occasion)    Recommendations for Other Services       Precautions / Restrictions Precautions Precautions: Fall Precaution Comments: Seizure, PEG, abdominal binder, wrist restraints Restrictions Weight Bearing Restrictions: No    Mobility  Bed Mobility Overal bed mobility: Needs Assistance Bed Mobility: Rolling;Sidelying to Sit;Supine to Sit;Sit to Supine;Sit to Sidelying Rolling: Min guard Sidelying to sit: Min guard Supine to sit: Min guard Sit to supine: Min guard Sit to sidelying: Min guard General bed mobility comments: Pt very impulsive and agitated, flinging herself around in bed. Required min guard assist for all bed mob she actively performed but up to maxAx2 to return to supine to maintain safety during bouts of trying to fling self out of bed or stand on bed.  Transfers Overall transfer level: Needs assistance Equipment used: 2 person hand held assist Transfers: Sit to/from Stand Sit to Stand: Min assist;+2 physical assistance         General transfer comment: 2 HHA and min-modAx2 to power up to stand, providing tactile cues to initiate.  Ambulation/Gait Ambulation/Gait assistance: Mod assist;+2 physical assistance Gait Distance (Feet): 15 Feet (x4 bouts) Assistive device: 2 person hand held assist Gait Pattern/deviations: Decreased stride length;Shuffle;Scissoring;Staggering right;Narrow base of support Gait  velocity: decreased Gait velocity  interpretation: <1.31 ft/sec, indicative of household ambulator General Gait Details: Pt ambulates with L leg internally rotated and with decreased initiation of L leg step compared to R, requiring tactile cues at hamstrings to initiate swing phase bilat but progressed to less cues with subsequent bouts. Required tactile and verbal cues to shift weight for steps.   Stairs             Wheelchair Mobility    Modified Rankin (Stroke Patients Only)       Balance Overall balance assessment: Needs assistance Sitting-balance support: Feet supported;Bilateral upper extremity supported Sitting balance-Leahy Scale: Poor Sitting balance - Comments: Static sitting EOB with bilat UE support and min guard assist when pt not impulsively trying to slide out of bed, otherwise up to maxAx2 to block her safely.   Standing balance support: During functional activity;Bilateral upper extremity supported Standing balance-Leahy Scale: Poor Standing balance comment: Standing with bilat HHA.                            Cognition Arousal/Alertness: Awake/alert Behavior During Therapy: Restless;Agitated;Impulsive Overall Cognitive Status: Difficult to assess Area of Impairment: Attention;Following commands;Safety/judgement;Awareness;Problem solving                   Current Attention Level: Divided   Following Commands: Follows one step commands inconsistently;Follows one step commands with increased time (requires tactile cues) Safety/Judgement: Decreased awareness of safety;Decreased awareness of deficits Awareness: Intellectual Problem Solving: Slow processing;Decreased initiation;Difficulty sequencing;Requires verbal cues;Requires tactile cues General Comments: Requires extensive physical cues/assistance to initiate all tasks with pt then actively assisting. However, unable to follow any verbal cues alone. Pt unaware of safety issues as she tends to try to stand on bed or fling  herself out of bed or sit prematurely. Pt very agitated and impulsive throughout session, requiring extensive assistance +2 to maintain safety by blocking pt as necessary.      Exercises      General Comments General comments (skin integrity, edema, etc.): Pt actively performed all AROM through bilat UEs and LEs when trying to pull self out of bed or stand in bed, strengthening all of her limb muscles and esp with transitioning <> quadriped <> tall-kneeling <> half kneeling positions      Pertinent Vitals/Pain Pain Assessment: Faces Faces Pain Scale: Hurts whole lot Pain Location: crying intermittently throughout, assumed pain in genital region Pain Descriptors / Indicators: Grimacing;Crying;Moaning Pain Intervention(s): Limited activity within patient's tolerance;Monitored during session;Repositioned;Other (comment) (RN gave meds during session)    Home Living                      Prior Function            PT Goals (current goals can now be found in the care plan section) Acute Rehab PT Goals Patient Stated Goal: to get the restraints removed PT Goal Formulation: Patient unable to participate in goal setting Time For Goal Achievement: 04/08/20 Potential to Achieve Goals: Fair Progress towards PT goals: Progressing toward goals    Frequency    Min 2X/week      PT Plan Current plan remains appropriate    Co-evaluation PT/OT/SLP Co-Evaluation/Treatment: Yes Reason for Co-Treatment: Complexity of the patient's impairments (multi-system involvement);Necessary to address cognition/behavior during functional activity;For patient/therapist safety;To address functional/ADL transfers PT goals addressed during session: Mobility/safety with mobility;Balance;Strengthening/ROM        AM-PAC PT "6 Clicks" Mobility  Outcome Measure  Help needed turning from your back to your side while in a flat bed without using bedrails?: A Little Help needed moving from lying on  your back to sitting on the side of a flat bed without using bedrails?: A Little Help needed moving to and from a bed to a chair (including a wheelchair)?: A Lot Help needed standing up from a chair using your arms (e.g., wheelchair or bedside chair)?: A Lot Help needed to walk in hospital room?: A Lot Help needed climbing 3-5 steps with a railing? : Total 6 Click Score: 13    End of Session Equipment Utilized During Treatment: Gait belt Activity Tolerance: Treatment limited secondary to agitation;Other (comment) (pt impulsive) Patient left: in bed;with call bell/phone within reach;with bed alarm set;with nursing/sitter in room;with restraints reapplied Nurse Communication: Mobility status;Precautions;Other (comment) (impulsivity and safety concerns) PT Visit Diagnosis: Unsteadiness on feet (R26.81);Muscle weakness (generalized) (M62.81);Difficulty in walking, not elsewhere classified (R26.2);Other symptoms and signs involving the nervous system (R29.898);Other abnormalities of gait and mobility (R26.89)     Time: 0820-0907 PT Time Calculation (min) (ACUTE ONLY): 47 min  Charges:  $Gait Training: 8-22 mins $Therapeutic Activity: 8-22 mins                     Raymond Gurney, PT, DPT Acute Rehabilitation Services  Pager: [phone] Office: [phone]    Jewel Baize 03/23/2020, 10:19 AM

## 2020-03-23 NOTE — Progress Notes (Signed)
Pt with increasing agitation; biting at restraint removing them, and bit nurse on forearm; spitting at staff; requiring 1:1 by RN, CN, etc. Medications given per orders by primary RN; Awaiting call back from [name], NP.

## 2020-03-23 NOTE — Progress Notes (Signed)
Demonstrating significant agitation again. Order given for additional Haldol 5mg  IM. Will also add Naltrexone 25 mg daily fir persistent hyper sexual behaviors

## 2020-03-23 NOTE — Progress Notes (Addendum)
[facility] PROGRESS NOTE  Jadaya Sommerfield WUJ:[id number] DOB: 05-11-1985 DOA: 01/04/2020 PCP: [name], PA-C  Status: Remains inpatient appropriate because:Altered mental status and Unsafe d/c plan  Dispo:  The patient is from: Home              Anticipated d/c is to: SNF              Anticipated d/c date is: > 3 days              Patient currently is medically stable to d/c.  Barrier to discharge is need for SNF placement, Medicaid and disability applications in process   Code Status: Full Family Communication: Stepmother [name] on 11/30 DVT prophylaxis: Lovenox Vaccination status: Family confirmed initial Pfizer Covid vaccine dose given in June, second dose given on 11/2  Foley catheter: No  HPI: 34 year old female with known history of substance abuse on prior methadone, active hepatitis C and gestational diabetes who presented initially with confusion.  She was initially admitted to [facility] in September with a diagnosis of encephalopathy presumed secondary to drug overdose.  Subsequent work-up inconclusive.  LP without signs of infection, MRI brain unremarkable and unfortunately she remained encephalopathic.  EEG did reveal focal seizures.  AED therapy was initiated and seizures were suppressed and she was transferred to [facility] for continuous EEG.  Apparently had episodes of status epilelepticus before full suppression of seizures with medications.  Subsequently neurological recovery was poor and delayed.  Neurology suspicious for autoimmune encephalitis and was given high-dose steroids with equivocal improvement but deteriorated after steroids completed.  Was given IVIG without any significant improvement in mentation.  Since that time patient has had waxing and waning mentation ranging from unresponsive to alert confused and verbal and at times restless and agitated.  When she is awake/alert she is able to ambulate but requires assistance due to impulsivity and  gait instability.  Unfortunately these episodes of wakefulness are never sustained.  It is suspected that patient has a chronic sequela from her encephalopathy and seizures therefore long-term SNF has been recommended.  He is inconsistent with oral intake and continues to require tube feedings for nutrition and hydration.  Subjective: Patient very animated and agitated upon my arrival to the room.  Quiring multiple providers including myself to attempt to calm her and redirect.  We were unsuccessful.  She had multiple episodes of intelligible speech including the following phrases: "Please do not leave me, and need to go to the bathroom, leave me alone".  Unfortunately patient has remained quite agitated throughout the morning therefore restraints reapplied and safety sitter placed at bedside.  She has been given IM Vistaril 25 mg x 1 with no change in agitation.  She was subsequently given 1 mg of IM Ativan with no improvement and has just been given 2 mg of Haldol.  It appeared patient was having back pain noting she did calm somewhat with massage to the low back.  She was also noted to have significant retrusion of hemorrhoids that are nonbleeding.  Pharmacotherapy has been initiated for these problems.  Objective: Vitals:   03/23/20 0354 03/23/20 0740  BP: 116/80 112/80  Pulse: 87 96  Resp: 19 18  Temp: 97.9 F (36.6 C) 98.4 F (36.9 C)  SpO2: 98% 100%    Intake/Output Summary (Last 24 hours) at 03/23/2020 1219 Last data filed at 03/23/2020 0000 Gross per 24 hour  Intake --  Output 925 ml  Net -925 ml   [employer]  03/16/20 0500 03/17/20 0412 03/18/20 0441  Weight: 69.4 kg 71.3 kg 71.3 kg    Exam:  Constitutional: NAD, pleasant agitated and repeatedly stating "please do not leave me" Respiratory: clear to auscultation bilaterally, no wheezing, no crackles. Normal respiratory effort. No accessory muscle use.  Air Cardiovascular: Regular rate and rhythm, no murmurs / rubs /  gallops. No extremity edema.  Skin warm and moist. Abdomen: no tenderness, no masses palpated.  Bowel sounds positive.  Site unremarkable.  Notable with protruding and enlarged external hemorrhoids Musculoskeletal: no clubbing / cyanosis. No joint deformity upper and lower extremities. Good ROM,  Normal muscle tone. Appeared to calm after low back was massaged Neurologic: CN 2-12 grossly intact. Sensation intact, Strength 5/5 x all 4 extremities. + gait instability Psychiatric: Alert and oriented x name only.  Very anxious agitated.  Crying at times.  Patient was eventually ambulated multiple times to the bathroom per her request and had several bowel movements.  She was aware she had stool in her fingers and asked for a towel to cleanse her hands.   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  Maximize depression therapy with Zoloft and the addition of Abilify as recommended by psychiatry but no improvement and only contributed to ongoing lethargy therefore these medications have been tapered and discontinued.  Patient was also found to have dystonic movements with Abilify -Attempted to utilize Ambien to improve wakefulness in brain injury patient but this was ineffective and appeared to cause increased agitation so was discontinued -11/29 low-dose Seroquel 12.5 mg added at 8 PM nightly noting patient with sundowning type behaviors.  As of 12/1 have increased dose to 25 mg daily. -While we are adjusting medications patient will require restraints and safety sitter -We will try to avoid utilization regularly of benzodiazepines for agitation and focus on  antianxiety agents such as Vistaril -Hemorrhoids and back pain may be contributing so we will treat these problems as well  Low back pain -As above -Completed 5 days of ibuprofen -We will add Voltaren gel and add as needed Flexeril (will DC Robaxin since there is to be ineffective)  Vaginal discharge -Empirically treated for vaginal Candida with one-time dose of Diflucan as well as empirically treated for possible BV with Flagyl  Salmonella UTI -Completed amoxicillin  Physical deconditioning -Inconsistent ability to participate with PT and OT- continues to require extensive assistance for all ADLs-we will need SNF at time of discharge  Suspected history of untreated chronic depression prior to admission/known polysubstance abuse -Attempted to maximize depression therapy but seem to make patient's mentation worse and did not improve symptoms therefore Zoloft 100 mg and Abilify 2 mg discontinued after being tapered  Dysphagia -When patient was alert was able to pass swallowing test and regular diet with thin liquids ordered -Inconsistent intake of oral foods due to altered mentation therefore continued on bolus tube feedings -Today patient did eat about 50% of her breakfast tray  Nutrition Status: Nutrition Problem: Inadequate oral intake Etiology: lethargy/confusion Signs/Symptoms: meal completion < 25% Interventions: Tube feeding, Prostat    Other problems: Nonobstructive transaminitis in context of hepatitis C -Elevated HCV antibody with markedly elevated HCV RNA quantitative level  consistent with chronic hepatitis C  -None total bilirubin  normal with slightly elevated alkaline phosphatase with AST 165 and ALT 274 -11/10: Discussed with ID Dr. Manson Passey.  Treatment of hepatitis C typically is initiated in the outpatient setting.  Medications can be quite expensive and usually take time to obtain insurance approval.  Also since patient likely go to skilled nursing facility  expensive medication could be barrier to admission. -ID recommends scheduling outpatient appointment their clinic closer to discharge date  ? Breast enlargement/gynecomastia -No clinical evidence of breast enlargement -Prolactin level low with normal FSH -Appearance of breast enlargement reported by family likely related to improve nutrition as well as abdominal binder moving up underneath breasts and malpositioning of breasts.  Diarrhea>>>Constipation Resolved Felt secondary to laxatives but given Salmonella UTI could have been related to Salmonella although at the time diarrhea was being worked up patient had no leukocytosis, no abdominal pain and no fevers.  Suspected oral Candida -Resolved after oral nystatin    Data Reviewed: Basic Metabolic Panel: No results for input(s): NA, K, CL, CO2, GLUCOSE, BUN, CREATININE, CALCIUM, MG, PHOS in the last 168 hours. Liver Function Tests: No results for input(s): AST, ALT, ALKPHOS, BILITOT, PROT, ALBUMIN in the last 168 hours. No results for input(s): LIPASE, AMYLASE in the last 168 hours. No results for input(s): AMMONIA in the last 168 hours. CBC: No results for input(s): WBC, NEUTROABS, HGB, HCT, MCV, PLT in the last 168 hours. Cardiac Enzymes: No results for input(s): CKTOTAL, CKMB, CKMBINDEX, TROPONINI in the last 168 hours. BNP (last 3 results) No results for input(s): BNP in the last 8760 hours.  ProBNP (last 3 results) No results for input(s): PROBNP in the last 8760 hours.  CBG: No results for input(s): GLUCAP in the last 168 hours.  Recent Results (from the past 240 hour(s))  Culture, blood (Routine X 2) w Reflex to ID Panel     Status: None   Collection Time: 03/15/20  4:27 PM   Specimen: BLOOD RIGHT HAND  Result Value Ref Range Status   Specimen Description BLOOD RIGHT HAND  Final   Special Requests   Final    BOTTLES DRAWN AEROBIC ONLY Blood Culture results may not be optimal due to an inadequate volume of blood  received in culture bottles   Culture   Final    NO GROWTH 5 DAYS Performed at [facility], [address], [place], [place] [address]    Report Status 03/20/2020 FINAL  Final  Culture, blood (Routine X 2) w Reflex to ID Panel     Status: None   Collection Time: 03/15/20  4:30 PM   Specimen: BLOOD RIGHT HAND  Result Value Ref Range Status   Specimen Description BLOOD RIGHT HAND  Final   Special Requests   Final    BOTTLES DRAWN AEROBIC AND ANAEROBIC Blood Culture adequate volume   Culture   Final    NO GROWTH 5 DAYS Performed at [facility], [address], [place], [place] [address]    Report Status 03/20/2020 FINAL  Final  Culture, Urine     Status: Abnormal (Preliminary result)   Collection Time: 03/16/20 12:27 AM   Specimen: Urine, Catheterized  Result Value Ref Range Status   Specimen Description URINE, CATHETERIZED  Final   Special Requests NONE  Final   Culture (A)  Final    >=100,000 COLONIES/mL SALMONELLA SPECIES HEALTH DEPARTMENT NOTIFIED Performed at [facility], [address], [place], [place] [address]    Report Status PENDING  Incomplete  Organism ID, Bacteria SALMONELLA SPECIES (A)  Final      Susceptibility   Salmonella species - MIC*    AMPICILLIN <=2 SENSITIVE Sensitive     LEVOFLOXACIN <=0.12 SENSITIVE Sensitive     TRIMETH/SULFA <=20 SENSITIVE Sensitive     * >=100,000 COLONIES/mL SALMONELLA SPECIES     Studies: EEG  Result Date: 03/22/2020 Charlsie Quest, MD     03/22/2020  5:10 PM Patient Name: Anita Davis MRN: [id number] Epilepsy Attending: Charlsie Quest Referring Physician/Provider: Dr Eric [place] Date: 03/22/2020 Duration: 26.30 mins Patient history: 34yo f with prior seizures, persistent ams. EEG to evaluate for seizure Level of alertness: Awake AEDs during EEG study: LCM, PB, PHT Technical aspects: This EEG study was done with scalp electrodes positioned according to the 10-20 International system of electrode  placement. Electrical activity was acquired at a sampling rate of  and reviewed with a high frequency filter of  and a low frequency filter of . EEG data were recorded continuously and digitally stored. Description: No clear posterior dominant rhythm was seen. There is an excessive amount of 15 to 18 Hz beta activity distributed symmetrically and diffusely. Patient was noted to have episodes of whole body shaking. Concomitant eeg before during and after the event didn't show any eeg change to suggest seizure. Hyperventilation and photic stimulation were not performed.   ABNORMALITY -Excessive beta, generalized IMPRESSION: This study is within normal limits. No seizures or epileptiform discharges were seen throughout the recording. Patient was noted to have episode of whole body shaking without concomitant eeg change and is NOT epileptic. Priyanka [name]    Scheduled Meds: . Chlorhexidine Gluconate Cloth  6 each Topical Daily  . cyclobenzaprine  5 mg Per Tube TID  . diclofenac Sodium  4 g Topical QID  . enoxaparin (LOVENOX) injection  40 mg Subcutaneous Q24H  . famotidine  20 mg Per Tube BID  . feeding supplement (OSMOLITE 1.5 CAL)  355 mL Per Tube TID  . feeding supplement (PROSource TF)  45 mL Per Tube TID  . free water  400 mL Per Tube Q6H  . Gerhardt's butt cream  1 application Topical TID  . hydrocortisone-pramoxine  1 applicator Rectal BID  . lacosamide  200 mg Per Tube BID  . methocarbamol  500 mg Per Tube TID  . PHENObarbital  60 mg Per Tube BID  . phenytoin  200 mg Per Tube BID  . potassium chloride  40 mEq Per Tube Daily  . vitamin B-6  100 mg Per Tube Daily  . QUEtiapine  25 mg Oral Daily  . thiamine  100 mg Per Tube Daily   Continuous Infusions:  Principal Problem:   Refractory seizure (HCC) Active Problems:   Acute metabolic encephalopathy   Hypokalemia   Polysubstance abuse (HCC)   Elevated CK   Transaminitis   Chronic hepatitis C without hepatic coma  (HCC)   Distended abdomen   Palliative care encounter   Colonic Ileus (HCC)   Organic brain syndrome   On enteral nutrition   Physical deconditioning   Protein-calorie malnutrition (HCC)   Inadequate oral nutritional intake   Consultants:  Neurology  Psychiatry  Interventional radiology  Surgery  Procedures:  9/14 lumbar puncture  9/15 EEG  9/16 EEG  9/17 EEG  9/19 overnight EEG with video  9/22 overnight EEG with video with discontinuation of long-term EEG monitoring on 9/25  9/24 core track placement  10/6 EEG  Antibiotics: Anti-infectives (From admission, onward)   Start  Dose/Rate Route Frequency Ordered Stop   03/19/20 1000  metroNIDAZOLE (FLAGYL) tablet 500 mg  Status:  Discontinued        500 mg Per Tube 2 times daily 03/19/20 0822 03/23/20 0827   03/18/20 1200  amoxicillin (AMOXIL) 250 MG/5ML suspension 500 mg  Status:  Discontinued        500 mg Per Tube Every 8 hours 03/18/20 0915 03/23/20 0559   03/17/20 1200  cefTRIAXone (ROCEPHIN) 1 g in sodium chloride 0.9 % 100 mL IVPB  Status:  Discontinued        1 g 200 mL/hr over 30 Minutes Intravenous Every 24 hours 03/17/20 1048 03/18/20 0913   03/16/20 1130  metroNIDAZOLE (FLAGYL) 50 mg/ml oral suspension 500 mg  Status:  Discontinued        500 mg Per Tube 2 times daily 03/16/20 1040 03/19/20 0821   03/16/20 1130  fluconazole (DIFLUCAN) 40 MG/ML suspension 152 mg        150 mg Per Tube  Once 03/16/20 1040 03/16/20 1245   02/11/20 1526  ceFAZolin (ANCEF) IVPB 2g/100 mL premix        2 g 200 mL/hr over 30 Minutes Intravenous  Once 02/11/20 1526 02/11/20 1641   02/05/20 0600  ceFAZolin (ANCEF) IVPB 2g/100 mL premix        2 g 200 mL/hr over 30 Minutes Intravenous To Short Stay 02/04/20 1054 02/05/20 0950   01/29/20 0600  ceFAZolin (ANCEF) IVPB 2g/100 mL premix        2 g 200 mL/hr over 30 Minutes Intravenous On call to O.R. 01/28/20 1511 01/30/20 0559   01/28/20 2000  cefTRIAXone (ROCEPHIN) 1 g in  sodium chloride 0.9 % 100 mL IVPB        1 g 200 mL/hr over 30 Minutes Intravenous Every 24 hours 01/28/20 1925 02/02/20 0724   01/06/20 0900  cefTRIAXone (ROCEPHIN) 2 g in sodium chloride 0.9 % 100 mL IVPB  Status:  Discontinued        2 g 200 mL/hr over 30 Minutes Intravenous Every 12 hours 01/06/20 0105 01/06/20 2202   01/06/20 0800  vancomycin (VANCOCIN) IVPB 1000 mg/200 mL premix  Status:  Discontinued       "Followed by" Linked Group Details   1,000 mg 200 mL/hr over 60 Minutes Intravenous Every 12 hours 01/05/20 1904 01/06/20 2202   01/05/20 2000  vancomycin (VANCOREADY) IVPB 1500 mg/300 mL       "Followed by" Linked Group Details   1,500 mg 150 mL/hr over 120 Minutes Intravenous  Once 01/05/20 1904 01/06/20 0127   01/05/20 1830  cefTRIAXone (ROCEPHIN) 2 g in sodium chloride 0.9 % 100 mL IVPB        2 g 200 mL/hr over 30 Minutes Intravenous  Once 01/05/20 1829 01/05/20 2220        Time spent: 40 minutes    [name] ANP  [facility] Pager [phone].  03/23/2020, 12:19 PM  LOS: 77 days

## 2020-03-24 DIAGNOSIS — Z20822 Contact with and (suspected) exposure to covid-19: Secondary | ICD-10-CM | POA: Diagnosis not present

## 2020-03-24 DIAGNOSIS — G9341 Metabolic encephalopathy: Secondary | ICD-10-CM | POA: Diagnosis not present

## 2020-03-24 DIAGNOSIS — T43691A Poisoning by other psychostimulants, accidental (unintentional), initial encounter: Secondary | ICD-10-CM | POA: Diagnosis not present

## 2020-03-24 DIAGNOSIS — Z66 Do not resuscitate: Secondary | ICD-10-CM | POA: Diagnosis not present

## 2020-03-24 LAB — GLUCOSE, CAPILLARY: Glucose-Capillary: 142 mg/dL — ABNORMAL HIGH (ref 70–99)

## 2020-03-24 MED ORDER — HALOPERIDOL LACTATE 5 MG/ML IJ SOLN: 5.0000 mg | Freq: Four times a day (QID) | INTRAMUSCULAR | Status: DC | PRN

## 2020-03-24 MED ORDER — HALOPERIDOL LACTATE 5 MG/ML IJ SOLN
5.0000 mg | Freq: Four times a day (QID) | INTRAMUSCULAR | Status: DC | PRN
  Administered 2020-03-24 – 2020-04-11 (×13): 5 mg via INTRAMUSCULAR
  Filled 2020-03-24 (×16): qty 1

## 2020-03-24 MED ORDER — HYDROXYZINE HCL 50 MG/ML IM SOLN
50.0000 mg | Freq: Four times a day (QID) | INTRAMUSCULAR | Status: DC | PRN
  Administered 2020-03-24 – 2020-04-11 (×18): 50 mg via INTRAMUSCULAR
  Filled 2020-03-24 (×24): qty 1

## 2020-03-24 MED ORDER — QUETIAPINE FUMARATE 25 MG PO TABS
12.5000 mg | ORAL_TABLET | Freq: Every day | ORAL | Status: DC
  Administered 2020-03-24: 12.5 mg via ORAL

## 2020-03-24 MED ORDER — QUETIAPINE FUMARATE 25 MG PO TABS
12.5000 mg | ORAL_TABLET | Freq: Every day | ORAL | Status: DC
  Administered 2020-03-25: 12.5 mg
  Filled 2020-03-24 (×2): qty 1

## 2020-03-24 NOTE — Progress Notes (Addendum)
[facility] PROGRESS NOTE  Anita Davis ZDG:[id number] DOB: 05/28/1985 DOA: 01/04/2020 PCP: [name], PA-C  Status: Remains inpatient appropriate because:Altered mental status and Unsafe d/c plan  Dispo:  The patient is from: Home              Anticipated d/c is to: SNF              Anticipated d/c date is: > 3 days              Patient currently is medically stable to d/c.  Barrier to discharge is need for SNF placement, Medicaid and disability applications in process   Code Status: Full Family Communication: Stepmother [name] on 11/30 DVT prophylaxis: Lovenox Vaccination status: Family confirmed initial Pfizer Covid vaccine dose given in June, second dose given on 11/2  Foley catheter: No  HPI: 34 year old female with known history of substance abuse on prior methadone, active hepatitis C and gestational diabetes who presented initially with confusion.  She was initially admitted to [facility] in September with a diagnosis of encephalopathy presumed secondary to drug overdose.  Subsequent work-up inconclusive.  LP without signs of infection, MRI brain unremarkable and unfortunately she remained encephalopathic.  EEG did reveal focal seizures.  AED therapy was initiated and seizures were suppressed and she was transferred to [facility] for continuous EEG.  Apparently had episodes of status epilelepticus before full suppression of seizures with medications.  Subsequently neurological recovery was poor and delayed.  Neurology suspicious for autoimmune encephalitis and was given high-dose steroids with equivocal improvement but deteriorated after steroids completed.  Was given IVIG without any significant improvement in mentation.  Since that time patient has had waxing and waning mentation ranging from unresponsive to alert confused and verbal and at times restless and agitated.  When she is awake/alert she is able to ambulate but requires assistance due to impulsivity and  gait instability.  Unfortunately these episodes of wakefulness are never sustained.  It is suspected that patient has a chronic sequela from her encephalopathy and seizures therefore long-term SNF has been recommended.  He is inconsistent with oral intake and continues to require tube feedings for nutrition and hydration.  Subjective: On initial evaluation of patient she was unrestrained and sleeping comfortably.  Later returned to evaluate patient and she was back in restraints and agitated although not as severe as 12/1.  She is not responding verbally and is not making any eye contact.  She remains restless and continues with pelvic thrusting concerning for hyper sexual behaviors.  Objective: Vitals:   03/24/20 0304 03/24/20 0900  BP: 95/75 109/70  Pulse: 62   Resp: 18 18  Temp: 98.6 F (37 C) 98.2 F (36.8 C)  SpO2: 100%     Intake/Output Summary (Last 24 hours) at 03/24/2020 1205 Last data filed at 03/24/2020 0248 Gross per 24 hour  Intake --  Output 400 ml  Net -400 ml   Filed Weights   03/17/20 0412 03/18/20 0441 03/24/20 0432  Weight: 71.3 kg 71.3 kg 74.3 kg    Exam:  Constitutional: NAD, remains restless Respiratory: clear to auscultation bilaterally, no wheezing, no crackles. Normal respiratory effort. No accessory muscle use.  RA Cardiovascular: Regular rate and rhythm, no murmurs / rubs / gallops. No extremity edema.  Skin warm and dry Abdomen: no tenderness, no masses palpated.  Bowel sounds positive.  Site unremarkable. Enlarged external hemorrhoids Musculoskeletal: no clubbing / cyanosis. No joint deformity upper and lower extremities. Good ROM,  Normal muscle  tone.  Neurologic: CN 2-12 grossly intact. Sensation intact, Strength 5/5 x all 4 extremities.  Psychiatric: Alert but nonverbal today.  Remains restless and is requiring restraints at the wrist and ankles to prevent injury.  Secondary school teacher at bedside.  Continues with pelvic thrusting movements concerning  for hypersexual behavior.  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  Maximize depression therapy with Zoloft and the addition of Abilify as recommended by psychiatry but no improvement and only contributed to ongoing lethargy therefore these medications have been tapered and discontinued.  Patient was also found to have dystonic movements with Abilify -Attempted to utilize Ambien to improve wakefulness in brain injury patient but this was ineffective and appeared to cause increased agitation so was discontinued -12/1 Seroquel increased dose to 25 mg daily at 8 PM.  12/2 will add 12.5 mg every morning -While we are adjusting medications patient will require restraints and safety sitter -Suspect behaviors worsened with utilization of benzodiazepines I have discontinued in favor of IM Vistaril initially followed by IM Haldol if agitation persists -Given behaviors consistent with hyper sexualization (patient with history of PTSD and narcotic abuse therefore prior to admission had abnormal stimulation of amygdala and hippocampus) opted to initiate low-dose daily naltrexone to treat hypersexual behaviors.  Titrate up as indicated -Suspect will take several weeks for both Seroquel naltrexone to have optimal effects  Low back pain -As above -Completed 5 days of ibuprofen -Continue Voltaren gel and add as needed Flexeril (will DC Robaxin since there is to be ineffective)  Vaginal discharge -Empirically treated for vaginal Candida with one-time dose of Diflucan as well as empirically treated for possible BV with Flagyl  Salmonella  UTI -Completed amoxicillin  Physical deconditioning -Inconsistent ability to participate with PT and OT- continues to require extensive assistance for all ADLs-we will need SNF at time of discharge  Suspected history of untreated chronic depression prior to admission/known polysubstance abuse -Attempted to maximize depression therapy but seem to make patient's mentation worse and did not improve symptoms therefore Zoloft 100 mg and Abilify 2 mg discontinued after being tapered  Dysphagia -When patient was alert was able to pass swallowing test and regular diet with thin liquids ordered -Inconsistent intake of oral foods due to altered mentation therefore continued on bolus tube feedings -Today patient did eat about 50% of her breakfast tray  Nutrition Status: Nutrition Problem: Inadequate oral intake Etiology: lethargy/confusion Signs/Symptoms: meal completion < 25% Interventions: Tube feeding, Prostat    Other problems: Nonobstructive transaminitis in context of hepatitis C -Elevated HCV antibody with markedly elevated HCV RNA quantitative level  consistent with chronic hepatitis C  -None total bilirubin normal with slightly elevated alkaline phosphatase with AST 165 and ALT 274 -11/10: Discussed with ID Dr. Manson Passey.  Treatment of hepatitis C typically is initiated in the outpatient setting.  Medications can be quite expensive and usually take time to obtain insurance approval.  Also since patient likely go to skilled nursing facility expensive medication could be barrier to admission. -ID recommends scheduling outpatient appointment their clinic closer to discharge date  ? Breast enlargement/gynecomastia -No clinical evidence of breast enlargement -Prolactin level low with normal FSH -Appearance of breast enlargement reported by family likely related  to improve nutrition as well as abdominal binder moving up underneath breasts and malpositioning of  breasts.  Diarrhea>>>Constipation Resolved Felt secondary to laxatives but given Salmonella UTI could have been related to Salmonella although at the time diarrhea was being worked up patient had no leukocytosis, no abdominal pain and no fevers.  Suspected oral Candida -Resolved after oral nystatin    Data Reviewed: Basic Metabolic Panel: No results for input(s): NA, K, CL, CO2, GLUCOSE, BUN, CREATININE, CALCIUM, MG, PHOS in the last 168 hours. Liver Function Tests: No results for input(s): AST, ALT, ALKPHOS, BILITOT, PROT, ALBUMIN in the last 168 hours. No results for input(s): LIPASE, AMYLASE in the last 168 hours. No results for input(s): AMMONIA in the last 168 hours. CBC: No results for input(s): WBC, NEUTROABS, HGB, HCT, MCV, PLT in the last 168 hours. Cardiac Enzymes: No results for input(s): CKTOTAL, CKMB, CKMBINDEX, TROPONINI in the last 168 hours. BNP (last 3 results) No results for input(s): BNP in the last 8760 hours.  ProBNP (last 3 results) No results for input(s): PROBNP in the last 8760 hours.  CBG: No results for input(s): GLUCAP in the last 168 hours.  Recent Results (from the past 240 hour(s))  Culture, blood (Routine X 2) w Reflex to ID Panel     Status: None   Collection Time: 03/15/20  4:27 PM   Specimen: BLOOD RIGHT HAND  Result Value Ref Range Status   Specimen Description BLOOD RIGHT HAND  Final   Special Requests   Final    BOTTLES DRAWN AEROBIC ONLY Blood Culture results may not be optimal due to an inadequate volume of blood received in culture bottles   Culture   Final    NO GROWTH 5 DAYS Performed at [facility], [address], [place], [place] [address]    Report Status 03/20/2020 FINAL  Final  Culture, blood (Routine X 2) w Reflex to ID Panel     Status: None   Collection Time: 03/15/20  4:30 PM   Specimen: BLOOD RIGHT HAND  Result Value Ref Range Status   Specimen Description BLOOD RIGHT HAND  Final   Special Requests   Final     BOTTLES DRAWN AEROBIC AND ANAEROBIC Blood Culture adequate volume   Culture   Final    NO GROWTH 5 DAYS Performed at [facility], [address] [place], [place] [address]    Report Status 03/20/2020 FINAL  Final  Culture, Urine     Status: Abnormal (Preliminary result)   Collection Time: 03/16/20 12:27 AM   Specimen: Urine, Catheterized  Result Value Ref Range Status   Specimen Description URINE, CATHETERIZED  Final   Special Requests NONE  Final   Culture (A)  Final    >=100,000 COLONIES/mL SALMONELLA SPECIES HEALTH DEPARTMENT NOTIFIED SENT TO [facility] FOR CONFIRMATION Performed at [facility], [address] [place], [place] [address]    Report Status PENDING  Incomplete   Organism ID, Bacteria SALMONELLA SPECIES (A)  Final      Susceptibility   Salmonella species - MIC*    AMPICILLIN <=2 SENSITIVE Sensitive     LEVOFLOXACIN <=0.12 SENSITIVE Sensitive     TRIMETH/SULFA <=20 SENSITIVE Sensitive     * >=100,000 COLONIES/mL SALMONELLA SPECIES     Studies: EEG  Result Date: 03/22/2020 Charlsie Quest, MD     03/22/2020  5:10 PM Patient Name: Gracy Ehly MRN: [id number] Epilepsy Attending: Charlsie Quest Referring Physician/Provider: Dr Eric [place]  Date: 03/22/2020 Duration: 26.30 mins Patient history: 34yo f with prior seizures, persistent ams. EEG to evaluate for seizure Level of alertness: Awake AEDs during EEG study: LCM, PB, PHT Technical aspects: This EEG study was done with scalp electrodes positioned according to the 10-20 International system of electrode placement. Electrical activity was acquired at a sampling rate of 500Hz  and reviewed with a high frequency filter of 70Hz  and a low frequency filter of 1Hz . EEG data were recorded continuously and digitally stored. Description: No clear posterior dominant rhythm was seen. There is an excessive amount of 15 to 18 Hz beta activity distributed symmetrically and diffusely. Patient was noted to have  episodes of whole body shaking. Concomitant eeg before during and after the event didn't show any eeg change to suggest seizure. Hyperventilation and photic stimulation were not performed.   ABNORMALITY -Excessive beta, generalized IMPRESSION: This study is within normal limits. No seizures or epileptiform discharges were seen throughout the recording. Patient was noted to have episode of whole body shaking without concomitant eeg change and is NOT epileptic. Priyanka [name] Yadav    Scheduled Meds:  Chlorhexidine Gluconate Cloth  6 each Topical Daily   cyclobenzaprine  5 mg Per Tube TID   diclofenac Sodium  4 g Topical QID   enoxaparin (LOVENOX) injection  40 mg Subcutaneous Q24H   famotidine  20 mg Per Tube BID   feeding supplement (OSMOLITE 1.5 CAL)  355 mL Per Tube TID   feeding supplement (PROSource TF)  45 mL Per Tube TID   free water  400 mL Per Tube Q6H   Gerhardt's butt cream  1 application Topical TID   hydrocortisone-pramoxine  1 applicator Rectal BID   lacosamide  200 mg Per Tube BID   naltrexone  25 mg Per Tube Daily   PHENObarbital  60 mg Per Tube BID   phenytoin  200 mg Per Tube BID   potassium chloride  40 mEq Per Tube Daily   vitamin B-6  100 mg Per Tube Daily   [START ON 03/25/2020] QUEtiapine  12.5 mg Per Tube Daily   thiamine  100 mg Per Tube Daily   Continuous Infusions:  Principal Problem:   Refractory seizure (HCC) Active Problems:   Acute metabolic encephalopathy   Hypokalemia   Polysubstance abuse (HCC)   Elevated CK   Transaminitis   Chronic hepatitis C without hepatic coma (HCC)   Distended abdomen   Palliative care encounter   Colonic Ileus (HCC)   Organic brain syndrome   On enteral nutrition   Physical deconditioning   Protein-calorie malnutrition (HCC)   Inadequate oral nutritional intake   Consultants:  Neurology  Psychiatry  Interventional radiology  Surgery  Procedures:  9/14 lumbar puncture  9/15 EEG  9/16  EEG  9/17 EEG  9/19 overnight EEG with video  9/22 overnight EEG with video with discontinuation of long-term EEG monitoring on 9/25  9/24 core track placement  10/6 EEG  Antibiotics: Anti-infectives (From admission, onward)   Start     Dose/Rate Route Frequency Ordered Stop   03/19/20 1000  metroNIDAZOLE (FLAGYL) tablet 500 mg  Status:  Discontinued        500 mg Per Tube 2 times daily 03/19/20 0822 03/23/20 0827   03/18/20 1200  amoxicillin (AMOXIL) 250 MG/5ML suspension 500 mg  Status:  Discontinued        500 mg Per Tube Every 8 hours 03/18/20 0915 03/23/20 0559   03/17/20 1200  cefTRIAXone (ROCEPHIN) 1 g in sodium  chloride 0.9 % 100 mL IVPB  Status:  Discontinued        1 g 200 mL/hr over 30 Minutes Intravenous Every 24 hours 03/17/20 1048 03/18/20 0913   03/16/20 1130  metroNIDAZOLE (FLAGYL) 50 mg/ml oral suspension 500 mg  Status:  Discontinued        500 mg Per Tube 2 times daily 03/16/20 1040 03/19/20 0821   03/16/20 1130  fluconazole (DIFLUCAN) 40 MG/ML suspension 152 mg        150 mg Per Tube  Once 03/16/20 1040 03/16/20 1245   02/11/20 1526  ceFAZolin (ANCEF) IVPB 2g/100 mL premix        2 g 200 mL/hr over 30 Minutes Intravenous  Once 02/11/20 1526 02/11/20 1641   02/05/20 0600  ceFAZolin (ANCEF) IVPB 2g/100 mL premix        2 g 200 mL/hr over 30 Minutes Intravenous To Short Stay 02/04/20 1054 02/05/20 0950   01/29/20 0600  ceFAZolin (ANCEF) IVPB 2g/100 mL premix        2 g 200 mL/hr over 30 Minutes Intravenous On call to O.R. 01/28/20 1511 01/30/20 0559   01/28/20 2000  cefTRIAXone (ROCEPHIN) 1 g in sodium chloride 0.9 % 100 mL IVPB        1 g 200 mL/hr over 30 Minutes Intravenous Every 24 hours 01/28/20 1925 02/02/20 0724   01/06/20 0900  cefTRIAXone (ROCEPHIN) 2 g in sodium chloride 0.9 % 100 mL IVPB  Status:  Discontinued        2 g 200 mL/hr over 30 Minutes Intravenous Every 12 hours 01/06/20 0105 01/06/20 2202   01/06/20 0800  vancomycin (VANCOCIN) IVPB 1000  mg/200 mL premix  Status:  Discontinued       "Followed by" Linked Group Details   1,000 mg 200 mL/hr over 60 Minutes Intravenous Every 12 hours 01/05/20 1904 01/06/20 2202   01/05/20 2000  vancomycin (VANCOREADY) IVPB 1500 mg/300 mL       "Followed by" Linked Group Details   1,500 mg 150 mL/hr over 120 Minutes Intravenous  Once 01/05/20 1904 01/06/20 0127   01/05/20 1830  cefTRIAXone (ROCEPHIN) 2 g in sodium chloride 0.9 % 100 mL IVPB        2 g 200 mL/hr over 30 Minutes Intravenous  Once 01/05/20 1829 01/05/20 2220       Time spent: 25 minutes    [name] ANP  [facility] Pager [phone].  03/24/2020, 12:05 PM  LOS: 78 days

## 2020-03-25 LAB — CBC WITH DIFFERENTIAL/PLATELET
Abs Immature Granulocytes: 0.02 10*3/uL (ref 0.00–0.07)
Basophils Absolute: 0 10*3/uL (ref 0.0–0.1)
Basophils Relative: 1 %
Eosinophils Absolute: 0 10*3/uL (ref 0.0–0.5)
Eosinophils Relative: 1 %
HCT: 38 % (ref 36.0–46.0)
Hemoglobin: 13.3 g/dL (ref 12.0–15.0)
Immature Granulocytes: 0 %
Lymphocytes Relative: 36 %
Lymphs Abs: 2.1 10*3/uL (ref 0.7–4.0)
MCH: 32.8 pg (ref 26.0–34.0)
MCHC: 35 g/dL (ref 30.0–36.0)
MCV: 93.6 fL (ref 80.0–100.0)
Monocytes Absolute: 0.5 10*3/uL (ref 0.1–1.0)
Monocytes Relative: 8 %
Neutro Abs: 3.2 10*3/uL (ref 1.7–7.7)
Neutrophils Relative %: 54 %
Platelets: 343 10*3/uL (ref 150–400)
RBC: 4.06 MIL/uL (ref 3.87–5.11)
RDW: 14 % (ref 11.5–15.5)
WBC: 5.8 10*3/uL (ref 4.0–10.5)
nRBC: 0 % (ref 0.0–0.2)

## 2020-03-25 LAB — COMPREHENSIVE METABOLIC PANEL
ALT: 158 U/L — ABNORMAL HIGH (ref 0–44)
AST: 114 U/L — ABNORMAL HIGH (ref 15–41)
Albumin: 3.6 g/dL (ref 3.5–5.0)
Alkaline Phosphatase: 150 U/L — ABNORMAL HIGH (ref 38–126)
Anion gap: 11 (ref 5–15)
BUN: 12 mg/dL (ref 6–20)
CO2: 24 mmol/L (ref 22–32)
Calcium: 8.7 mg/dL — ABNORMAL LOW (ref 8.9–10.3)
Chloride: 100 mmol/L (ref 98–111)
Creatinine, Ser: 0.65 mg/dL (ref 0.44–1.00)
GFR, Estimated: >60 mL/min (ref >60)
Glucose, Bld: 289 mg/dL — ABNORMAL HIGH (ref 70–99)
Potassium: 4.6 mmol/L (ref 3.5–5.1)
Sodium: 135 mmol/L (ref 135–145)
Total Bilirubin: 0.4 mg/dL (ref 0.3–1.2)
Total Protein: 6.4 g/dL — ABNORMAL LOW (ref 6.5–8.1)

## 2020-03-25 MED ORDER — ZIPRASIDONE MESYLATE 20 MG IM SOLR
20.0000 mg | Freq: Once | INTRAMUSCULAR | Status: AC
  Administered 2020-03-25: 20 mg via INTRAMUSCULAR
  Filled 2020-03-25 (×2): qty 20

## 2020-03-25 MED ORDER — NALTREXONE HCL 50 MG PO TABS
50.0000 mg | ORAL_TABLET | Freq: Every day | ORAL | Status: DC
  Administered 2020-03-26 – 2020-03-30 (×5): 50 mg
  Filled 2020-03-25 (×6): qty 1

## 2020-03-25 MED ORDER — QUETIAPINE FUMARATE 25 MG PO TABS
25.0000 mg | ORAL_TABLET | Freq: Every day | ORAL | Status: DC
  Administered 2020-03-26 – 2020-03-27 (×2): 25 mg
  Filled 2020-03-25 (×2): qty 1

## 2020-03-25 MED ORDER — QUETIAPINE FUMARATE 50 MG PO TABS
50.0000 mg | ORAL_TABLET | Freq: Every day | ORAL | Status: DC
  Administered 2020-03-25: 50 mg via ORAL
  Filled 2020-03-25: qty 1

## 2020-03-25 NOTE — Progress Notes (Signed)
Occupational Therapy Treatment [name] MRN: [id number] DOB: 03/24/1986 Today's Date: 03/25/2020    History of present illness 34 year old with past medical history significant for hepatitis C, polysubstance abuse brought to the hospital 9/13 for disorientation which was suspected to be substance abuse.  After she became more lucid, she was discharged, but then police brought her back later in the day and they found her passed out in a parking lot.  Urine drug screen was positive for benzodiazepine and THC. An EEG done on 9/15 showed patient was in status epilepticus.  Patient was a started on Keppra.  Despite these, EEG noted continued seizures requiring additional medications as well.  Finally on 9/18, seizures broke. Follow-up EEG on 9/20 noted evidence of epilepticity from the left central temporal region.  Repeat EEG done on 9/22 noted epileptogenicity from the left central temporal region.  Pt with PEG placed on 02/05/20.    OT comments  Pt continuing to present with constant hip thrusting movements in bed noted to have chewed off her wrist restraint. Pt not following commands or making eye contact during session. Pt attempting to exit bed upon arrival, assisted pt to EOB with MIN A +2 however pt continuing with hip thrusting behaviors once EOB needing up MAX A +2 for safety as pt nearly thrusting herself OOB. Attempted household distance functional mobility in room with MAX hand held assist +2 however pt resistant to assistance needing up to MAX A +3 to redirect pt. Pt required MAX A +3 to return pt to supine and reapply restraints. Pt would continue to benefit from skilled occupational therapy while admitted and after d/c to address the below listed limitations in order to improve overall functional mobility and facilitate independence with BADL participation. DC plan remains appropriate, will follow acutely per POC.    Follow Up Recommendations   SNF;Supervision/Assistance - 24 hour    Equipment Recommendations  3 in 1 bedside commode;Wheelchair (measurements OT);Wheelchair cushion (measurements OT);Hospital bed    Recommendations for Other Services      Precautions / Restrictions Precautions Precautions: Fall Precaution Comments: Seizure, PEG, abdominal binder, 4 point restraints Restrictions Weight Bearing Restrictions: No       Mobility Bed Mobility Overal bed mobility: Needs Assistance Bed Mobility: Supine to Sit     Supine to sit: Min guard Sit to supine: Max assist;+2 for physical assistance;+2 for safety/equipment (+3 needed as pt resistant to physical assistance)   General bed mobility comments: pt restless in bed coming to sitting with only min guard for safety; however once pt EOB but impulsively attempting to walk away from bed needing up MAX A +3 to return pt to supine  Transfers Overall transfer level: Needs assistance Equipment used: 2 person hand held assist Transfers: Sit to/from Stand Sit to Stand: +2 physical assistance;Min assist         General transfer comment: +2 assist to power up into standing with MIN A +2 for safety as pt attempting to run away upon stand    Balance Overall balance assessment: Needs assistance Sitting-balance support: Feet supported;Bilateral upper extremity supported Sitting balance-Leahy Scale: Poor Sitting balance - Comments: needs external assist for safety as pt trying to slide off of bed d/t repetitive hip thrusting movements even from EOB   Standing balance support: During functional activity;Bilateral upper extremity supported Standing balance-Leahy Scale: Poor Standing balance comment: requires hand held (A) to keep pt focused on mobility  ADL either performed or assessed with clinical judgement   ADL Overall ADL's : Needs assistance/impaired                         Toilet Transfer: +2 for physical  assistance;Ambulation;Moderate assistance;Maximal assistance Toilet Transfer Details (indicate cue type and reason): simulated via functional mobility although pt resistant to assistance needing up to MAX A +3 at times for safety         Functional mobility during ADLs: +2 for physical assistance;Moderate assistance;Maximal assistance (+3 at times for safety) General ADL Comments: pt continues to present with hypersexual behaviors noted by pts repeated hips thrusts from all 4s. Per NT pt bit off one of her wrist restraints and needed up to MAX A +3 to ambulate short distance in room and return back to supine safely     Vision       Perception     Praxis      Cognition Arousal/Alertness: Awake/alert Behavior During Therapy: Restless;Impulsive Overall Cognitive Status: Difficult to assess Area of Impairment: Attention;Following commands;Safety/judgement;Awareness;Problem solving                   Current Attention Level: Focused   Following Commands: Follows one step commands inconsistently Safety/Judgement: Decreased awareness of safety;Decreased awareness of deficits Awareness: Intellectual Problem Solving: Slow processing;Decreased initiation;Difficulty sequencing;Requires verbal cues;Requires tactile cues General Comments: pt making pelvic thrusting gestures upon arrival noted to have chewed off her R wrist restraint, pt not following commands needing up to MAX A +3 for safety with mobility tasks        Exercises     Shoulder Instructions       General Comments pt in 4 point restraints noted to have chewed off L restraint, ? veil bed for safety as long peg tube can be secured    Pertinent Vitals/ Pain       Pain Assessment: Faces Faces Pain Scale: Hurts whole lot Pain Location: pt groaning, crying, and demonstrates movement that suggest discomfort/ changes in sensation to genital region Pain Descriptors / Indicators: Grimacing;Crying;Moaning Pain  Intervention(s): Monitored during session;Repositioned  Home Living                                          Prior Functioning/Environment              Frequency  Min 2X/week        Progress Toward Goals  OT Goals(current goals can now be found in the care plan section)  Progress towards OT goals: Progressing toward goals  Acute Rehab OT Goals Patient Stated Goal: none stated OT Goal Formulation: Patient unable to participate in goal setting Time For Goal Achievement: 04/06/20 Potential to Achieve Goals: Fair  Plan Discharge plan remains appropriate;Frequency remains appropriate    Co-evaluation                 AM-PAC OT "6 Clicks" Daily Activity     Outcome Measure   Help from another person eating meals?: A Lot Help from another person taking care of personal grooming?: A Lot Help from another person toileting, which includes using toliet, bedpan, or urinal?: A Lot Help from another person bathing (including washing, rinsing, drying)?: A Lot Help from another person to put on and taking off regular upper body clothing?: A Lot Help from another person to put on  and taking off regular lower body clothing?: A Lot 6 Click Score: 12    End of Session Equipment Utilized During Treatment: Gait belt  OT Visit Diagnosis: Unsteadiness on feet (R26.81);Muscle weakness (generalized) (M62.81);Pain;Other symptoms and signs involving the nervous system (R29.898);Other symptoms and signs involving cognitive function;Cognitive communication deficit (R41.841);Ataxia, unspecified (R27.0);Feeding difficulties (R63.3);Other abnormalities of gait and mobility (R26.89)   Activity Tolerance Other (comment) (decreaesed safety awareness and impulsivity, not following commands)   Patient Left in bed;with call bell/phone within reach;with nursing/sitter in room;with restraints reapplied   Nurse Communication Mobility status;Other (comment) (veil bed?)         Time: 3545-6256 OT Time Calculation (min): 10 min  Charges: OT General Charges $OT Visit: 1 Visit OT Treatments $Therapeutic Activity: 8-22 mins  Audery Amel., COTA/L Acute Rehabilitation Services [phone]    Angelina Pih 03/25/2020, 12:09 PM

## 2020-03-25 NOTE — Progress Notes (Addendum)
Pt was crawling on the floor into the hallway. The sitter and several RNs had to help the patient back onto her feet and safely return to the room. The NP was made aware and placed new orders.

## 2020-03-25 NOTE — Progress Notes (Addendum)
[facility] PROGRESS NOTE  Anita Davis ZOX:[id number] DOB: 07-Jan-1986 DOA: 01/04/2020 PCP: Jacquelin HawkingMcElroy, Shannon, PA-C         Status: Remains inpatient appropriate because:Altered mental status and Unsafe d/c plan  Dispo:  The patient is from: Home              Anticipated d/c is to: SNF              Anticipated d/c date is: > 3 days              Patient currently is medically stable to d/c.  Barrier to discharge is need for SNF placement, Medicaid and disability applications in process   Code Status: Full Family Communication: Stepmother Bonita QuinLinda on 11/30 DVT prophylaxis: Lovenox Vaccination status: Family confirmed initial Pfizer Covid vaccine dose given in June, second dose given on 11/2  Foley catheter: No  HPI: 34 year old female with known history of substance abuse on prior methadone, active hepatitis C and gestational diabetes who presented initially with confusion.  She was initially admitted to [facility] in September with a diagnosis of encephalopathy presumed secondary to drug overdose.  Subsequent work-up inconclusive.  LP without signs of infection, MRI brain unremarkable and unfortunately she remained encephalopathic.  EEG did reveal focal seizures.  AED therapy was initiated and seizures were suppressed and [facility].  Apparently had episodes of status epilelepticus before full suppression of seizures with medications.  Subsequently neurological recovery was poor and delayed.  Neurology suspicious for autoimmune encephalitis and was given high-dose steroids with equivocal improvement but deteriorated after steroids completed.  Was given IVIG without any significant improvement in mentation.  Since that time patient has had waxing and waning mentation ranging from unresponsive to alert confused and verbal and at times restless and agitated.  When she is awake/alert she is able to ambulate but requires assistance due to  impulsivity and gait instability.  Unfortunately these episodes of wakefulness are never sustained.  It is suspected that patient has a chronic sequela from her encephalopathy and seizures therefore long-term SNF has been recommended.  He is inconsistent with oral intake and continues to require tube feedings for nutrition and hydration.  Subjective: Patient very sleepy early this morning but did eat 50% of breakfast when fed.  Returned later and patient had bitten through one wrist restraint.  She seemed eager to walk.  Restraints removed and this did help patient calm somewhat.  Decision made to order a Vail enclosure bed to minimize restraint application but to maximize patient safety  Patient later walked per myself and nursing tech throughout the unit.  Patient did remember where the ice machine and beverage station is at the nursing station.  She had previously stated she was very thirsty.  1st trip to the nursing station a cup of cola was made for the patient and she drank all of it while ambulating.  She then purposefully redirected the walk back to the beverage area.  She found a clean pitcher and began feeling it with ice.  We added cola to the eyes and straw and patient drank all of the liquid.  She was subsequently walked back to her room, placed in a recliner bed alarm and feet elevated.  She had also grabbed a banana off the table and began eating it before sitting down.  She appeared somewhat tired after all of the ambulation.  She was minimally verbal during all of this stating I am  thirsty.  At one point prior to sitting down she went to the bathroom and sat on the commode but was unable to have any bowel or bladder elimination.  Objective: Vitals:   03/24/20 2020 03/25/20 0054  BP: 122/84 110/81  Pulse: 97 86  Resp: 18 18  Temp: 98.4 F (36.9 C) 98.2 F (36.8 C)  SpO2: 98% 98%    Intake/Output Summary (Last 24 hours) at 03/25/2020 1107 Last data filed at 03/25/2020 0756 Gross per  24 hour  Intake 780 ml  Output --  Net 780 ml   Filed Weights   03/17/20 0412 03/18/20 0441 03/24/20 0432  Weight: 71.3 kg 71.3 kg 74.3 kg    Exam:  Constitutional: NAD, remains restless and impulsive Respiratory: clear to auscultation bilaterally, no wheezing, no crackles. Normal respiratory effort. No accessory muscle use.  RA Cardiovascular: Regular rate and rhythm, no murmurs / rubs / gallops. No extremity edema.  Skin warm and dry Abdomen: no tenderness, no masses palpated.  Bowel sounds positive.  Site unremarkable. Enlarged external hemorrhoids Musculoskeletal: no clubbing / cyanosis. No joint deformity upper and lower extremities. Good ROM,  Normal muscle tone.  Neurologic: CN 2-12 grossly intact. Sensation intact, Strength 5/5 x all 4 extremities.  Gait unsteady and patient has limited awareness of physical limitations while ambulating and is not safe to ambulate independently at this juncture. Psychiatric: Alert with short minimal verbal responses such as on Thursday.  Of note no more pelvic thrusting or hypersexual behavior noted.  Patient impulsive with activity but appeared to calm with restraints removed.  Ambulated as described above.   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  Maximized depression therapy with Zoloft and the addition of Abilify as recommended by psychiatry but no improvement and only contributed to ongoing lethargy therefore these medications have been tapered and discontinued.  Patient was also found to have dystonic movements with Abilify **upon review of the literature it was noted that in some patients with  apathy (which she has demonstrated during the hospitalization) SSRI class of medications can actually worsen the apathy in brain injury patients.** -Attempted to utilize Ambien to improve wakefulness in brain injury patient but this was ineffective and appeared to cause increased agitation so was discontinued -While we are adjusting medications patient will require restraints and safety sitter -Suspect behaviors worsened with utilization of benzodiazepines I have discontinued in favor of IM Vistaril initially followed by IM Haldol if agitation persists -Given behaviors consistent with hyper sexualization (patient with history of PTSD and narcotic abuse therefore prior to admission had abnormal stimulation of amygdala and hippocampus) opted to initiate low-dose daily naltrexone to treat hypersexual behaviors.  Titrate up as indicated -Suspect will take several weeks for both Seroquel naltrexone to have optimal effects; in the past 24 hours has been given 3 doses of Vistaril IM and one dose of Haldol IM--EKG 12/2 QTC 435 ms -As of 12/3 patient has been remaining awake for greater than 72 hours with intermittent verbal response.  She started eating.  She is ambulating and is demonstrating some purposeful behaviors.  Her hypersexual behaviors have improved.  Plans are to obtain a Vail enclosure bed to provide patient safety but minimize requirement for extremity restraints.  **  Unfortunately after improved behaviors earlier today patient became extremely agitated and was requiring 5 staff members to manage her.  She had been given Vistaril and Haldol with no improvement in symptoms therefore Geodon 20 mg IM x1 has been ordered.  She is also exhibiting extreme emotional lability.  She is crying, raising her hands above her head and stating "please God forgive me".  In review of her medications it looks like her p.m. dosage of Seroquel at some point had been discontinued.  Have opted to increase daytime Seroquel to  25 mg and bedtime Seroquel to 50 mg.  Still awaiting Vail bed enclosure.  Also continues to have issues with emotional lability and persistent hypersexual behaviors as evidenced by repeated pelvic thrusting including in the hallway therefore will increase naltrexone to 50 mg daily  Low back pain -As above -Completed 5 days of ibuprofen -Continue Voltaren gel and add as needed Flexeril (will DC Robaxin since there is to be ineffective)  Vaginal discharge -Empirically treated for vaginal Candida with one-time dose of Diflucan as well as empirically treated for possible BV with Flagyl  Salmonella UTI -Completed amoxicillin  Physical deconditioning -Inconsistent ability to participate with PT and OT- continues to require extensive assistance for all ADLs-we will need SNF at time of discharge  Suspected history of untreated chronic depression prior to admission/known polysubstance abuse -Attempted to maximize depression therapy but seem to make patient's mentation worse and did not improve symptoms therefore Zoloft 100 mg and Abilify 2 mg discontinued after being tapered  Dysphagia -When patient was alert was able to pass swallowing test and regular diet with thin liquids ordered -Inconsistent intake of oral foods due to altered mentation therefore continued on bolus tube feedings -Today patient did eat about 50% of her breakfast tray  Nutrition Status: Nutrition Problem: Inadequate oral intake Etiology: lethargy/confusion Signs/Symptoms: meal completion < 25% Interventions: Tube feeding, Prostat    Other problems: Nonobstructive transaminitis in context of hepatitis C -Elevated HCV antibody with markedly elevated HCV RNA quantitative level  consistent with chronic hepatitis C  -None total bilirubin normal with slightly elevated alkaline phosphatase with AST 165 and ALT 274 -11/10: Discussed with ID Dr. Manson Passey.  Treatment of hepatitis C typically is initiated in the outpatient  setting.  Medications can be quite expensive and usually take time to obtain insurance approval.  Also since patient likely go to skilled nursing facility expensive medication could be barrier to admission. -ID recommends scheduling outpatient appointment their clinic closer to discharge date  ? Breast enlargement/gynecomastia -No clinical evidence of breast enlargement -Prolactin level low with normal FSH -Appearance of breast enlargement reported by family likely related to improve nutrition as well as abdominal binder moving up underneath breasts and malpositioning of breasts.  Diarrhea>>>Constipation Resolved Felt secondary to laxatives but given Salmonella UTI could have been related to Salmonella although at the time diarrhea was being worked up patient had no leukocytosis, no abdominal pain and no fevers.  Suspected oral Candida -Resolved after oral nystatin    Data Reviewed: Basic Metabolic Panel: No results for input(s): NA, K, CL, CO2, GLUCOSE, BUN, CREATININE, CALCIUM, MG, PHOS in the last 168 hours. Liver Function Tests: No results for input(s): AST, ALT, ALKPHOS, BILITOT, PROT, ALBUMIN in the last 168 hours. No results for input(s): LIPASE, AMYLASE in the last 168 hours. No results for input(s): AMMONIA in the last 168 hours. CBC: No results for input(s): WBC, NEUTROABS, HGB, HCT, MCV, PLT in the last 168 hours. Cardiac Enzymes: No  results for input(s): CKTOTAL, CKMB, CKMBINDEX, TROPONINI in the last 168 hours. BNP (last 3 results) No results for input(s): BNP in the last 8760 hours.  ProBNP (last 3 results) No results for input(s): PROBNP in the last 8760 hours.  CBG: Recent Labs  Lab 03/24/20 1609  GLUCAP 142*    Recent Results (from the past 240 hour(s))  Culture, blood (Routine X 2) w Reflex to ID Panel     Status: None   Collection Time: 03/15/20  4:27 PM   Specimen: BLOOD RIGHT HAND  Result Value Ref Range Status   Specimen Description BLOOD RIGHT  HAND  Final   Special Requests   Final    BOTTLES DRAWN AEROBIC ONLY Blood Culture results may not be optimal due to an inadequate volume of blood received in culture bottles   Culture   Final    NO GROWTH 5 DAYS Performed at [facility], [address] [place], [place] [address]    Report Status 03/20/2020 FINAL  Final  Culture, blood (Routine X 2) w Reflex to ID Panel     Status: None   Collection Time: 03/15/20  4:30 PM   Specimen: BLOOD RIGHT HAND  Result Value Ref Range Status   Specimen Description BLOOD RIGHT HAND  Final   Special Requests   Final    BOTTLES DRAWN AEROBIC AND ANAEROBIC Blood Culture adequate volume   Culture   Final    NO GROWTH 5 DAYS Performed at [facility], [address], [place], [place] [address]    Report Status 03/20/2020 FINAL  Final  Culture, Urine     Status: Abnormal (Preliminary result)   Collection Time: 03/16/20 12:27 AM   Specimen: Urine, Catheterized  Result Value Ref Range Status   Specimen Description URINE, CATHETERIZED  Final   Special Requests NONE  Final   Culture (A)  Final    >=100,000 COLONIES/mL SALMONELLA SPECIES HEALTH DEPARTMENT NOTIFIED SENT TO STATE LAB FOR CONFIRMATION Performed at [facility], [address], [place], [place] [address]    Report Status PENDING  Incomplete   Organism ID, Bacteria SALMONELLA SPECIES (A)  Final      Susceptibility   Salmonella species - MIC*    AMPICILLIN <=2 SENSITIVE Sensitive     LEVOFLOXACIN <=0.12 SENSITIVE Sensitive     TRIMETH/SULFA <=20 SENSITIVE Sensitive     * >=100,000 COLONIES/mL SALMONELLA SPECIES     Studies: No results found.  Scheduled Meds: . Chlorhexidine Gluconate Cloth  6 each Topical Daily  . cyclobenzaprine  5 mg Per Tube TID  . diclofenac Sodium  4 g Topical QID  . enoxaparin (LOVENOX) injection  40 mg Subcutaneous Q24H  . famotidine  20 mg Per Tube BID  . feeding supplement (OSMOLITE 1.5 CAL)  355 mL Per Tube TID  . feeding  supplement (PROSource TF)  45 mL Per Tube TID  . free water  400 mL Per Tube Q6H  . Gerhardt's butt cream  1 application Topical TID  . hydrocortisone-pramoxine  1 applicator Rectal BID  . lacosamide  200 mg Per Tube BID  . naltrexone  25 mg Per Tube Daily  . PHENObarbital  60 mg Per Tube BID  . phenytoin  200 mg Per Tube BID  . potassium chloride  40 mEq Per Tube Daily  . vitamin B-6  100 mg Per Tube Daily  . QUEtiapine  12.5 mg Per Tube Daily  . thiamine  100 mg Per  Tube Daily   Continuous Infusions:  Principal Problem:   Refractory seizure (HCC) Active Problems:   Acute metabolic encephalopathy   Hypokalemia   Polysubstance abuse (HCC)   Elevated CK   Transaminitis   Chronic hepatitis C without hepatic coma (HCC)   Distended abdomen   Palliative care encounter   Colonic Ileus (HCC)   Organic brain syndrome   On enteral nutrition   Physical deconditioning   Protein-calorie malnutrition (HCC)   Inadequate oral nutritional intake   Consultants:  Neurology  Psychiatry  Interventional radiology  Surgery  Procedures:  9/14 lumbar puncture  9/15 EEG  9/16 EEG  9/17 EEG  9/19 overnight EEG with video  9/22 overnight EEG with video with discontinuation of long-term EEG monitoring on 9/25  9/24 core track placement  10/6 EEG  Antibiotics: Anti-infectives (From admission, onward)   Start     Dose/Rate Route Frequency Ordered Stop   03/19/20 1000  metroNIDAZOLE (FLAGYL) tablet 500 mg  Status:  Discontinued        500 mg Per Tube 2 times daily 03/19/20 0822 03/23/20 0827   03/18/20 1200  amoxicillin (AMOXIL) 250 MG/5ML suspension 500 mg  Status:  Discontinued        500 mg Per Tube Every 8 hours 03/18/20 0915 03/23/20 0559   03/17/20 1200  cefTRIAXone (ROCEPHIN) 1 g in sodium chloride 0.9 % 100 mL IVPB  Status:  Discontinued        1 g 200 mL/hr over 30 Minutes Intravenous Every 24 hours 03/17/20 1048 03/18/20 0913   03/16/20 1130  metroNIDAZOLE (FLAGYL)  50 mg/ml oral suspension 500 mg  Status:  Discontinued        500 mg Per Tube 2 times daily 03/16/20 1040 03/19/20 0821   03/16/20 1130  fluconazole (DIFLUCAN) 40 MG/ML suspension 152 mg        150 mg Per Tube  Once 03/16/20 1040 03/16/20 1245   02/11/20 1526  ceFAZolin (ANCEF) IVPB 2g/100 mL premix        2 g 200 mL/hr over 30 Minutes Intravenous  Once 02/11/20 1526 02/11/20 1641   02/05/20 0600  ceFAZolin (ANCEF) IVPB 2g/100 mL premix        2 g 200 mL/hr over 30 Minutes Intravenous To Short Stay 02/04/20 1054 02/05/20 0950   01/29/20 0600  ceFAZolin (ANCEF) IVPB 2g/100 mL premix        2 g 200 mL/hr over 30 Minutes Intravenous On call to O.R. 01/28/20 1511 01/30/20 0559   01/28/20 2000  cefTRIAXone (ROCEPHIN) 1 g in sodium chloride 0.9 % 100 mL IVPB        1 g 200 mL/hr over 30 Minutes Intravenous Every 24 hours 01/28/20 1925 02/02/20 0724   01/06/20 0900  cefTRIAXone (ROCEPHIN) 2 g in sodium chloride 0.9 % 100 mL IVPB  Status:  Discontinued        2 g 200 mL/hr over 30 Minutes Intravenous Every 12 hours 01/06/20 0105 01/06/20 2202   01/06/20 0800  vancomycin (VANCOCIN) IVPB 1000 mg/200 mL premix  Status:  Discontinued       "Followed by" Linked Group Details   1,000 mg 200 mL/hr over 60 Minutes Intravenous Every 12 hours 01/05/20 1904 01/06/20 2202   01/05/20 2000  vancomycin (VANCOREADY) IVPB 1500 mg/300 mL       "Followed by" Linked Group Details   1,500 mg 150 mL/hr over 120 Minutes Intravenous  Once 01/05/20 1904 01/06/20 0127   01/05/20 1830  cefTRIAXone (  ROCEPHIN) 2 g in sodium chloride 0.9 % 100 mL IVPB        2 g 200 mL/hr over 30 Minutes Intravenous  Once 01/05/20 1829 01/05/20 2220       Time spent: 35 minutes    [name] ANP  [facility] Pager [phone].  03/25/2020, 11:07 AM  LOS: 79 days

## 2020-03-25 NOTE — TOC Progression Note (Signed)
Transition of Care [facility]) - Progression Note    Patient Details  Name: Anita Davis MRN: [id number] Date of Birth: Apr 29, 1985  [facility] CM/SW Contact  Kermit Balo, RN Phone Number: 03/25/2020, 2:24 PM  Clinical Narrative:    Pt continues to have medicaid pending and no bed offers.  TOC following.   Expected Discharge Plan: Skilled Nursing Facility Barriers to Discharge: Continued Medical Work up, Inadequate or no insurance, Active Substance Use - Placement, SNF Pending payor source - LOG, SNF Pending Medicaid, SNF Pending bed offer  Expected Discharge Plan and Services Expected Discharge Plan: Skilled Nursing Facility In-house Referral: Financial Counselor   Post Acute Care Choice: Skilled Nursing Facility Living arrangements for the past 2 months: Mobile Home                                       Social Determinants of Health (SDOH) Interventions    Readmission Risk Interventions No flowsheet data found.

## 2020-03-25 NOTE — Progress Notes (Signed)
Pt had managed to bite through soft wrist restraints and crawl onto the floor with the sitter. PRN medication has been given and NP notified.

## 2020-03-26 MED ORDER — QUETIAPINE FUMARATE 50 MG PO TABS
50.0000 mg | ORAL_TABLET | Freq: Every day | ORAL | Status: DC
  Administered 2020-03-26 – 2020-03-27 (×2): 50 mg
  Filled 2020-03-26 (×2): qty 1

## 2020-03-26 NOTE — Plan of Care (Signed)
  Problem: Education: Goal: Knowledge of General Education information will improve Description: Including pain rating scale, medication(s)/side effects and non-pharmacologic comfort measures Outcome: Progressing   Problem: Health Behavior/Discharge Planning: Goal: Ability to manage health-related needs will improve Outcome: Progressing   Problem: Health Behavior/Discharge Planning: Goal: Ability to manage health-related needs will improve Outcome: Progressing   Problem: Clinical Measurements: Goal: Ability to maintain clinical measurements within normal limits will improve Outcome: Progressing Goal: Will remain free from infection Outcome: Progressing Goal: Diagnostic test results will improve Outcome: Progressing Goal: Respiratory complications will improve Outcome: Progressing Goal: Cardiovascular complication will be avoided Outcome: Progressing   Problem: Nutrition: Goal: Adequate nutrition will be maintained Outcome: Progressing   Problem: Coping: Goal: Level of anxiety will decrease Outcome: Progressing   Problem: Elimination: Goal: Will not experience complications related to bowel motility Outcome: Progressing Goal: Will not experience complications related to urinary retention Outcome: Progressing

## 2020-03-26 NOTE — Progress Notes (Signed)
PROGRESS NOTE    Anita Davis  OBS:[id number] DOB: 1986-04-05 DOA: 01/04/2020 PCP: [name], PA-C    Chief Complaint  Patient presents with  . Medical Clearance    Brief Narrative: 34 year old lady with prior history of substance abuse on methadone previously, active hepatitis C and gestational diabetes plan;presented initially with confusion to AP secondary to drug overdose.  She underwent MRI of the brain which was unremarkable but she remained encephalopathic.  LP was done without any signs of infection.  EEG did not reveal focal seizures and AED therapy was initiated.  She was transferred to [facility] from [facility] for continuous EEG as patient had episodes of status epilepticus.  Subsequently her neurological recovery was poor.  She was further treated with high-dose steroids and IVIG without any significant improvement in mentation.  For the last 5 days patient continues to have waxing and waning mentation ranging from lethargy, unresponsiveness to  verbally and physically abusive to the staff and impulsiveness,  Agitation and hypersexual behavior , with intermittent periods of calmness.  Unclear if this is due to sequelae of her encephalopathy and seizures.  Change in her mental status when compared to yesterday.  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 Currently requiring a safety sitter and veil bed.  Avoid benzodiazepines, use vistaril for agitation episodes, followed by IM haldol.  Currently on naltrexone 50 mg daily Seroquel 25 mg during the day and 50 mg during the night.    Seizures No seizure activity evident. Continue with Vimpat 200 mg twice daily, phenytoin 200 mg twice daily, phenobarbital 60 milligrams twice daily.    Protein calorie malnutrition Supplementation added.   Physical deconditioning Will need SNF at discharge   Salmonella UTI Completed the course of antibiotics.   Low back pain Currently on Flexeril as needed   DVT prophylaxis: Lovenox Code Status: Full code Family Communication: None at bedside Disposition:   Status is: Inpatient  Remains inpatient appropriate because:Unsafe d/c plan   Dispo: The patient is from: Home              Anticipated d/c is to: SNF              Anticipated d/c date is: > 3 days              Patient currently is medically stable to d/c.       Consultants:   Neurology  Psychiatry  Procedures: None  Antimicrobials: None   Subjective:  Waning in maximum mental status from lethargy to agitation, restlessness Objective: Vitals:   03/25/20 1532 03/25/20 2016 03/25/20 2340 03/26/20 0333  BP: 118/81 120/79 119/77 114/71  Pulse: 96 91 88 83  Resp: 20 18 16 18   Temp: 98.2 F (36.8 C) 98 F (36.7 C) 98.3 F (36.8 C) 98.1 F (36.7 C)  TempSrc: Axillary Oral Oral Oral  SpO2: 100% 100%  99% 98%  Weight:    75.3 kg  Height:        Intake/Output Summary (Last 24 hours) at 03/26/2020 1617 Last data filed at 03/26/2020 1229 Gross per 24 hour  Intake 1835 ml  Output 675 ml  Net 1160 ml   Filed Weights   03/18/20 0441 03/24/20 0432 03/26/20 0333  Weight: 71.3 kg 74.3 kg 75.3 kg    Examination:  General exam: Sitting up in a chair and drinking water Respiratory system: Clear to auscultation. Respiratory  effort normal. Cardiovascular system: S1 & S2 heard, RRR. Gastrointestinal system: Abdomen is soft nontender bowel sounds normal Central nervous system: Alert but confused, disoriented Extremities: No pedal edema. Skin: No rashes, lesions or ulcers Psychiatry: Fluctuating mental status is between lethargy and agitation    Data Reviewed: I have personally reviewed following labs and imaging studies  CBC: Recent Labs  Lab 03/25/20 1248  WBC 5.8  NEUTROABS 3.2  HGB 13.3  HCT 38.0  MCV 93.6  PLT 343    Basic Metabolic Panel: Recent Labs  Lab 03/25/20 1248  NA 135  K 4.6  CL 100  CO2 24  GLUCOSE 289*  BUN 12  CREATININE 0.65  CALCIUM 8.7*    GFR: Estimated Creatinine Clearance: 98.7 mL/min (by C-G formula based on SCr of 0.65 mg/dL).  Liver Function Tests: Recent Labs  Lab 03/25/20 1248  AST 114*  ALT 158*  ALKPHOS 150*  BILITOT 0.4  PROT 6.4*  ALBUMIN 3.6    CBG: Recent Labs  Lab 03/24/20 1609  GLUCAP 142*     No results found for this or any previous visit (from the past 240 hour(s)).       Radiology Studies: No results found.      Scheduled Meds: . Chlorhexidine Gluconate Cloth  6 each Topical Daily  . cyclobenzaprine  5 mg Per Tube TID  . diclofenac Sodium  4 g Topical QID  . enoxaparin (LOVENOX) injection  40 mg Subcutaneous Q24H  . famotidine  20 mg Per Tube BID  . feeding supplement (OSMOLITE 1.5 CAL)  355 mL Per Tube TID  . feeding supplement (PROSource TF)  45 mL Per Tube TID  . free water  400 mL Per Tube Q6H  . Gerhardt's butt cream  1 application Topical TID  . hydrocortisone-pramoxine  1 applicator Rectal BID  . lacosamide  200 mg Per Tube BID  . naltrexone  50 mg Per Tube Daily  . PHENObarbital  60 mg Per Tube BID  . phenytoin  200 mg Per Tube BID  . potassium chloride  40 mEq Per Tube Daily  . vitamin B-6  100 mg Per Tube Daily  . QUEtiapine  25 mg Per Tube Daily  . QUEtiapine  50 mg Per Tube QHS  . thiamine  100  mg Per Tube Daily   Continuous Infusions:   LOS: 80 days       Kathlen Mody, MD [facility]   To contact the attending provider between 7A-7P or the covering provider during after hours 7P-7A, please log into the web site [email or web address] and access using universal [place] password for that web site. If you do not have the password, please call the hospital operator.  03/26/2020, 4:17 PM

## 2020-03-27 DIAGNOSIS — Z20822 Contact with and (suspected) exposure to covid-19: Secondary | ICD-10-CM | POA: Diagnosis not present

## 2020-03-27 DIAGNOSIS — Z66 Do not resuscitate: Secondary | ICD-10-CM | POA: Diagnosis not present

## 2020-03-27 DIAGNOSIS — G9341 Metabolic encephalopathy: Secondary | ICD-10-CM | POA: Diagnosis not present

## 2020-03-27 DIAGNOSIS — T43691A Poisoning by other psychostimulants, accidental (unintentional), initial encounter: Secondary | ICD-10-CM | POA: Diagnosis not present

## 2020-03-27 MED ORDER — SODIUM CHLORIDE 0.9 % IV BOLUS: 500.0000 mL | Freq: Once | INTRAVENOUS | Status: DC

## 2020-03-27 MED ORDER — STERILE WATER FOR INJECTION IJ SOLN
INTRAMUSCULAR | Status: AC
  Administered 2020-03-27: 1.2 mL
  Filled 2020-03-27: qty 10

## 2020-03-27 MED ORDER — ZIPRASIDONE MESYLATE 20 MG IM SOLR
20.0000 mg | Freq: Once | INTRAMUSCULAR | Status: AC
  Administered 2020-03-27: 20 mg via INTRAMUSCULAR
  Filled 2020-03-27: qty 20
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
## 2020-03-28 DIAGNOSIS — F09 Unspecified mental disorder due to known physiological condition: Secondary | ICD-10-CM

## 2020-03-28 DIAGNOSIS — Z7189 Other specified counseling: Secondary | ICD-10-CM

## 2020-03-28 MED ORDER — ENSURE ENLIVE PO LIQD
237.0000 mL | Freq: Three times a day (TID) | ORAL | Status: DC
  Administered 2020-03-28 – 2020-05-02 (×67): 237 mL via ORAL
  Filled 2020-03-28 (×3): qty 237

## 2020-03-28 MED ORDER — HYDROXYZINE HCL 25 MG PO TABS: 25.0000 mg | ORAL_TABLET | Freq: Three times a day (TID) | ORAL | Status: DC

## 2020-03-28 MED ORDER — QUETIAPINE FUMARATE 50 MG PO TABS
50.0000 mg | ORAL_TABLET | Freq: Every day | ORAL | Status: DC
  Administered 2020-03-28: 50 mg
  Filled 2020-03-28: qty 1

## 2020-03-28 MED ORDER — HYDROXYZINE HCL 10 MG PO TABS
10.0000 mg | ORAL_TABLET | Freq: Three times a day (TID) | ORAL | Status: DC
  Administered 2020-03-28 – 2020-03-30 (×9): 10 mg
  Filled 2020-03-28 (×12): qty 1

## 2020-03-28 MED ORDER — QUETIAPINE FUMARATE 100 MG PO TABS: 100.0000 mg | ORAL_TABLET | Freq: Every day | ORAL | Status: DC

## 2020-03-28 MED ORDER — ENSURE ENLIVE PO LIQD
237.0000 mL | Freq: Two times a day (BID) | ORAL | Status: DC
  Administered 2020-03-28: 237 mL via ORAL

## 2020-03-28 MED ORDER — OLANZAPINE 2.5 MG PO TABS
2.5000 mg | ORAL_TABLET | Freq: Two times a day (BID) | ORAL | Status: DC
  Administered 2020-03-28 – 2020-03-29 (×3): 2.5 mg via ORAL
  Filled 2020-03-28 (×3): qty 1

## 2020-03-28 MED ORDER — ZOLPIDEM TARTRATE 5 MG PO TABS
5.0000 mg | ORAL_TABLET | Freq: Every evening | ORAL | Status: DC | PRN
  Filled 2020-03-28: qty 1

## 2020-03-28 NOTE — Progress Notes (Signed)
Patient ID: Anita Davis, female   DOB: Sep 06, 1985, 34 y.o.   MRN: [id number]  This NP visited patient at the bedside as a follow up to  yesterday's GOCs meeting.  34 year old female with known history of substance abuse on prior methadone, active hepatitis C and gestational diabetes who presented initially with confusion.  She was initially admitted to [facility] in September with a diagnosis of encephalopathy presumed secondary to drug overdose.  Subsequent work-up inconclusive.  LP without signs of infection, MRI brain unremarkable and unfortunately she remained encephalopathic.  EEG did reveal focal seizures.  AED therapy was initiated and seizures were suppressed and she was transferred to [facility] for continuous EEG.  Apparently had episodes of status epilelepticus before full suppression of seizures with medications.  Subsequently neurological recovery was poor and delayed.  Neurology suspicious for autoimmune encephalitis and was given high-dose steroids with equivocal improvement but deteriorated after steroids completed.  Was given IVIG without any significant improvement in mentation.  Since that time patient has had waxing and waning mentation ranging from unresponsive to alert confused and verbal and at times restless and agitated.  When she is awake/alert she is able to ambulate but requires assistance due to impulsivity and gait instability.  Unfortunately these episodes of wakefulness are never sustained.  It is suspected that patient has a chronic sequela from her encephalopathy and seizures therefore long-term SNF has been recommended.  She is inconsistent with oral intake and continues to require tube feedings for nutrition and hydration.  Today patient is alert, able to ambulate independently, patient requires one-on-one support secondary to sporadic behavior, impulsivity and safety issues.  Spoke with father/   Anita Davis.   Created space and opportunity for patient's father  to explore his thoughts and feelings regarding current medical situation.  This is a very difficult situation and he speaks to a complex dense psychosocial history.  Patient has a long polysubstance abuse history with a dense psychiatric history at baseline.  Her father tells me that she was committed to a mental health hospital at age 19, she was raised by her mother until that point who had substance abuse issues of her own.    Untimately the goal is for Anita Davis to be stable for disposition home.  Uncertain if this would be under her father's care.  He tells me that currently he is seeking guardianship for his daughter.  Recommendation is to move Anita Davis forward supporting herself from a nutritional standpoint without the assistance of the PEG.  Plan of care -DC tube feeding, leave PEG in place - Calorie count - regular diet, assistance with feeds and encouragement at mealtime and in between  Discussed with father the importance of continued conversation with his family and their  medical providers regarding overall plan of care and treatment options,  ensuring decisions are within the context of the patients values and GOCs.  Education offered regarding elements to advanced care planning specific to CODE STATUS, artificial feeding and hydration, antibiotic use, and rehospitalization's.  The difference between an aggressive medical intervention path and a palliative comfort path for this patient at this time in this situation was detailed.   Questions and concerns addressed   Discussed with Dr Blake Divine, Pleas Koch and PT and bedside RN  Total time spent on the unit was  Minutes 45 minutes   PMT will continue to support holistically  Greater than 50% of the time was spent in counseling and coordination of care  Lorinda Creed NP  Palliative Medicine Team Team Phone # [phone] Pager [phone]

## 2020-03-28 NOTE — Progress Notes (Signed)
Patient is alert and oriented, sometimes confused and forgetful during shift assessment. She is pleasant on approach, and  conversational with staff today. She ate most of the snacks and food given to her. Patient follows commands. IM haldol was given to her at 1813 for agitation, she tolerated medication well with no side effect noted. Patient will still need a one to one sitter due to restlessness and agitation. No distress noted at this time. Will continue to monitor.

## 2020-03-28 NOTE — Progress Notes (Signed)
Occupational Therapy Treatment [name] MRN: [id number] DOB: 01/21/1986 Today's Date: 03/28/2020    History of present illness 34 year old with past medical history significant for hepatitis C, polysubstance abuse brought to the hospital 9/13 for disorientation which was suspected to be substance abuse.  After she became more lucid, she was discharged, but then police brought her back later in the day and they found her passed out in a parking lot.  Urine drug screen was positive for benzodiazepine and THC. An EEG done on 9/15 showed patient was in status epilepticus.  Patient was a started on Keppra.  Despite these, EEG noted continued seizures requiring additional medications as well.  Finally on 9/18, seizures broke. Follow-up EEG on 9/20 noted evidence of epilepticity from the left central temporal region.  Repeat EEG done on 9/22 noted epileptogenicity from the left central temporal region.  Pt with PEG placed on 02/05/20.    OT comments  Session dovetailed with PT. Pt making good progress this session. Initially upon OTA arrival,  pt but restless stating, " I am done with all of this." Redirected pt with functional activity of making oatmeal with pt able to ambulate to kitchen with min A- minguard +2 for safety. Pt required MIN A to complete IADL task needing cues to sequence task and for kitchen safety. Assisted pt back to room where pt able to self feed with set- up assist. Pt able to verbalize needs this session but noted pt to be tearful sporadically during session. Pt also able to complete tabletop activities which included tracing shapes, rewriting words and coloring. Pt would continue to benefit from skilled occupational therapy while admitted and after d/c to address the below listed limitations in order to improve overall functional mobility and facilitate independence with BADL participation. DC plan remains appropriate, will follow acutely per POC.    Follow Up  Recommendations  SNF;Supervision/Assistance - 24 hour    Equipment Recommendations  3 in 1 bedside commode;Wheelchair (measurements OT);Wheelchair cushion (measurements OT);Hospital bed    Recommendations for Other Services      Precautions / Restrictions Precautions Precautions: Fall Precaution Comments: Seizure, PEG, abdominal binder, 4 point restraints Restrictions Weight Bearing Restrictions: No       Mobility Bed Mobility Overal bed mobility: Needs Assistance Bed Mobility: Supine to Sit;Sit to Supine     Supine to sit: Supervision   Sit to sidelying: Supervision General bed mobility comments: pt coming to EOBwith rails up impulsively with supervision, pt returned self tp supine with supervision  Transfers Overall transfer level: Needs assistance Equipment used: 1 person hand held assist Transfers: Sit to/from Stand Sit to Stand: Min guard         General transfer comment: minguard for safety    Balance Overall balance assessment: Needs assistance Sitting-balance support: Feet supported;No upper extremity supported Sitting balance-Leahy Scale: Fair Sitting balance - Comments: close supervision   Standing balance support: During functional activity;No upper extremity supported Standing balance-Leahy Scale: Good Standing balance comment: close minguard, no LOB noted                           ADL either performed or assessed with clinical judgement   ADL Overall ADL's : Needs assistance/impaired Eating/Feeding: Supervision/ safety;Minimal assistance;Cueing for safety;Cueing for sequencing Eating/Feeding Details (indicate cue type and reason): able to make oatmeal with close superivison and MIN cues for sequencing as pt attempt to eat oatmeal without warming it up  Toilet Transfer: Ambulation;Min guard;Minimal assistance;+2 for Chief Strategy Officer Details (indicate cue type and reason): simulated via  functional mobility; MIN A - minguard mostly for safety         Functional mobility during ADLs: Minimal assistance;Min guard;+2 for safety/equipment General ADL Comments: pt able to follow commands this session and talks in full sentences- noted to be emtional sporadically throughout session. pt able to copy shapes, write name of shapes and follow commands such as " draw an "x" through the circle"     Vision       Perception     Praxis      Cognition Arousal/Alertness: Awake/alert Behavior During Therapy: Restless;Impulsive;WFL for tasks assessed/performed (upon arrival but very impulsive and restless stating " I'm done" but able to redirect pt and allow her to attend to a task by end of session) Overall Cognitive Status: Impaired/Different from baseline Area of Impairment: Attention;Following commands;Safety/judgement;Awareness;Problem solving                   Current Attention Level: Focused   Following Commands: Follows multi-step commands with increased time;Follows one step commands consistently Safety/Judgement: Decreased awareness of safety;Decreased awareness of deficits Awareness: Intellectual Problem Solving: Slow processing;Decreased initiation;Difficulty sequencing;Requires verbal cues;Requires tactile cues General Comments: pt following commands this session and speaking in full sentences- responded well to funtionally task of making oatmeal- able to complete task with MIN A needing cues for correct sequnce and supervision for kitchen safety. pt able to complete functional table top activity of locating shapes and copying words/ numbers. pt seems to respond well when you can redirect her to functional task        Exercises Other Exercises Other Exercises: issued pt work book of table top activities where pt instructed to track R<>L to work through maze activity, copy words and trace shapes. additionally issued pt coloring book with crayons with pt seeming to  enjoy activity with sitter present   Shoulder Instructions       General Comments pt chewing on restraint upon arrival stating " I am over all of this" however able to redirect to functional tasks during session- pt tearful during session    Pertinent Vitals/ Pain       Pain Assessment: Faces Faces Pain Scale: Hurts a little bit Pain Location: general discomfort Pain Descriptors / Indicators: Discomfort Pain Intervention(s): Limited activity within patient's tolerance;Monitored during session;Repositioned  Home Living                                          Prior Functioning/Environment              Frequency  Min 2X/week        Progress Toward Goals  OT Goals(current goals can now be found in the care plan section)  Progress towards OT goals: Progressing toward goals  Acute Rehab OT Goals Patient Stated Goal: none stated OT Goal Formulation: Patient unable to participate in goal setting Time For Goal Achievement: 04/06/20 Potential to Achieve Goals: Fair  Plan Discharge plan remains appropriate;Frequency remains appropriate    Co-evaluation                 AM-PAC OT "6 Clicks" Daily Activity     Outcome Measure   Help from another person eating meals?: A Little Help from another person taking care of personal grooming?: A Little Help from another  person toileting, which includes using toliet, bedpan, or urinal?: A Little Help from another person bathing (including washing, rinsing, drying)?: A Little Help from another person to put on and taking off regular upper body clothing?: A Little Help from another person to put on and taking off regular lower body clothing?: A Little 6 Click Score: 18    End of Session    OT Visit Diagnosis: Unsteadiness on feet (R26.81);Muscle weakness (generalized) (M62.81);Pain;Other symptoms and signs involving the nervous system (R29.898);Other symptoms and signs involving cognitive function;Cognitive  communication deficit (R41.841);Ataxia, unspecified (R27.0);Feeding difficulties (R63.3);Other abnormalities of gait and mobility (R26.89)   Activity Tolerance Patient tolerated treatment well   Patient Left in bed;with call bell/phone within reach;with nursing/sitter in room   Nurse Communication Mobility status        Time: 1403-1430 OT Time Calculation (min): 27 min  Charges: OT General Charges $OT Visit: 1 Visit OT Treatments $Self Care/Home Management : 23-37 mins  Audery Amel., COTA/L Acute Rehabilitation Services [phone]    Angelina Pih 03/28/2020, 3:40 PM

## 2020-03-28 NOTE — Progress Notes (Addendum)
Nutrition Follow-up  DOCUMENTATION CODES:   Not applicable  INTERVENTION:  -Calorie Count per MD; RD will follow-up with results on Wednesday, 12/8 -Ensure Enlive po TID, each supplement provides 350 kcal and 20 grams of protein -Magic cup TID with meals, each supplement provides 290 kcal and 9 grams of protein -Continue 82ml Prosource TF TID via PEG, each supplement provides 40 kcals and 11 grams of protein  Staff should continue to encourage PO intake and should assist pt with meals as necessary.   NUTRITION DIAGNOSIS:   Inadequate oral intake related to lethargy/confusion as evidenced by meal completion < 25%.  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 PMH includes hepatitis and polysubstance abuse.  9/24 Cortrak placed (gastric) 10/10 Cortrak dislodged, TF held 10/11 Cortrak advanced (tip in proximal duodenum per KUB), TF restarted 10/14 TF reduced to 45ml/hr due to colonic ileus 10/15 G-tube placement 10/16 TF resumed 10/18 TF rate returned to 32ml/hr; G-tube pulled out 1 inch so TF held 10/21 G-tube replaced 10/27 diet advanced to regular  Pt's mentation appears to be improving, though pt is noted to have become intermittently agitated with staff over the last week. Pt provided few responses to RD questions. Pt reports that she is hungry. Pt also reports 10/10 stomach pain (reported with flat affect). Discussed pt with RN.   Pt's father corresponded with Palliative Care and requested pt's TF be discontinued since pt is more alert and eating more consistently. Pt's TF orders were 1.5 cartons ( ) of Osmolite 1.5 cal via  PEGTID, (flush with 69ml free water before and after eachbolus), 72ml Prosource TF TID and an additional free water Q6H (per MD). Note that TF has been discontinued by MD, but Prosource TF per tube and free water per tube have not been discontinued. Will continue these orders to provide pt with additional kcals/protein/fluid. Will also order calorie count per MD and monitor for adequacy of intake.   PO Intake: 0-100% x last 8 recorded meals (56% average meal intake). Will order oral nutrition supplements to provide pt with additional kcals/protein.   Admit weight: 74.8 kg Current weight: 75.3 kg   No UOP documented.   Labs reviewed. Medications: Pepcid, Klor-con, Vitamin B6, Thiamine   Diet Order:   Diet Order            Diet regular Room service appropriate? Yes; Fluid consistency: Thin  Diet effective now                 EDUCATION NEEDS:   No education needs have been identified at this time  Skin:  Skin Assessment: Reviewed RN Assessment Skin Integrity Issues:: Incisions Incisions: abdomen  Last BM:  12/6  Height:   Ht Readings from Last 1 Encounters:  01/06/20 5\' 5"  (1.651 m)    Weight:   Wt Readings from Last 1 Encounters:  03/26/20 75.3 kg    BMI:  Body mass index is 27.62 kg/m.  Estimated Nutritional Needs:   Kcal:  1800-2000  Protein:  90-100 grams  Fluid:  >/=1.8L/d    14/04/21, MS, RD, LDN RD pager number and weekend/on-call pager number located in [place].

## 2020-03-28 NOTE — Plan of Care (Signed)
  Problem: Safety: Goal: Ability to remain free from injury will improve Outcome: Progressing   Problem: Pain Managment: Goal: General experience of comfort will improve Outcome: Progressing   Problem: Skin Integrity: Goal: Risk for impaired skin integrity will decrease Outcome: Progressing   Problem: Medication: Goal: Risk for medication side effects will decrease Outcome: Progressing   Problem: Self-Concept: Goal: Level of anxiety will decrease Outcome: Progressing Goal: Ability to verbalize feelings about condition will improve Outcome: Progressing

## 2020-03-28 NOTE — Progress Notes (Signed)
0530 - pt incontinent of stool, not responding to questions. Myself and NT clean her up.   0545 - pt continues to be questionable of her comprehension of commands, stares at me with a blank expression when asking her to get back in bed, as she is standing in the middle of the room  0600 - pt walks out of the room towards the nurses station, myself, Advertising account planner, [place] NT all assist her into a patient recliner at the desk and pt appears more calm and cooperates with staying seated in the chair. Several nurses are in the station as well as techs, chair locked and footrest up, yellow nonskid socks on.

## 2020-03-28 NOTE — Progress Notes (Signed)
Pt continues to be pleasant, follow commands and remain ao4.  Assisted pt to bathroom with walker, and back to bed. Afterwards requests snack - "I am so hungry, I have the munchies!" Provided sandwich bag, ice cream, crackers, apple sauce, and chips. Pt thanks this nurse, denies further needs.

## 2020-03-28 NOTE — Progress Notes (Signed)
Pt now wandering the halls dancing, accompanied by NT [facility] and myself.

## 2020-03-28 NOTE — Consult Note (Signed)
[facility] Consult   Reason for Consult:  Encephalopathy, neuro recommend psych consult.  Referring Physician:  Dr. Maryfrances Bunnellanford Patient Identification: Anita Davis MRN:  [id number] Principal Diagnosis: Refractory seizure [facility](HCC) Diagnosis:  Principal Problem:   Refractory seizure (HCC) Active Problems:   Acute metabolic encephalopathy   Hypokalemia   Polysubstance abuse (HCC)   Elevated CK   Transaminitis   Chronic hepatitis C without hepatic coma (HCC)   Distended abdomen   Palliative care encounter   Colonic Ileus (HCC)   Organic brain syndrome   On enteral nutrition   Physical deconditioning   Protein-calorie malnutrition (HCC)   Inadequate oral nutritional intake   Total Time spent with patient: 30 minutes  Subjective:   Anita Poissonmanda Boesel is a 34 y.o. female patient admitted with disorientation which was suspected to be substance abuse.  On evaluation patient was alert, and immediately responded "I am doing okay ."  She was observed to be drinking unassisted with both hands.  After introducing self to patient, my reasons for being present patient went mute.  Unable to get her to state anything else throughout the evaluation.  Despite multiple attempts from safety sitter who is also at bedside, patient would not respond to questions being asked.  Psych consult was placed for wandering behaviors, biting off of restraints, hallucinations, and agitation.  On evaluation patient is observed to be sitting upright in bed, she was able to say she felt okay prior to becoming mute. It remains unclear if she was selectively mute or if this catatonic behaviors. SHe was is unable to actively participate in assessment at this time, as she is noted to be nonverbal.    On evaluation patient is alert at this time, she is calm and cooperative, has failed attempts to participate in evaluation. Patient observed to be smiling briefly however unable to vocalize words, as she has difficulty  answering questions.   At this time she does not appear to be causing any disturbance, there is no notable agitation, aggression, and or combativeness.  She does not appear to be hallucinating at this time.  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   HPI:   34 year old female with history of polysubstance abuse (methamphetamine, THC) who initially presented to [facility] on 01/06/2020 with encephalopathy. 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 Keppra was started and eventually Vimpat was added with improvement EEG but patient continued to be altered and therefore [facility]. On arrival, EEG showed sharp waves in left central temporal region as well as lateralized rhythmic delta activity in left hemisphere which improved after adding phenytoin and therefore was most likely ictal in nature. Since then phenobarbital also been added with further improvement in EEG (no more sharp waves). Of note, patient underwent [facility] on 01/05/2020 which did not show any pleocytosis and had normal protein and glucose. 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 During this whole time, patient has had fluctuating mental status, at 1 point she was able to follow commands and say simple words but has since had worsening again and is currently nonverbal and following simple commands intermittently.   Past Psychiatric History: Substance abuse.   Risk to Self:  Unable to assess Risk to Others:   Unable to assess Prior Inpatient Therapy:   Unable to assess Prior Outpatient Therapy:   UTA, per chart substance abuse detox facilities, [address]  Past Medical History:  Past Medical History:  Diagnosis Date  . Allergy   . Bronchitis   . Hepatitis-C   . History of gestational diabetes   . HPV in female   . Substance abuse [facility])      Past Surgical History:  Procedure Laterality Date  . HEMORRHOID BANDING  age 4  . IR REPLC GASTRO/COLONIC TUBE PERCUT W/FLUORO  02/11/2020  . LAPAROSCOPIC GASTROSTOMY N/A 02/05/2020   Procedure: LAPAROSCOPIC GASTROSTOMY PLACEMENT;  Surgeon: Gaynelle Adu, MD;  Location: [facility] OR;  Service: General;  Laterality: N/A;   Family History:  Family History  Problem Relation Age of Onset  . Depression Mother   . Cervical cancer Mother   . Hypertension Father   . Diabetes Father    Family Psychiatric  History: UTA Social History:  Social History   Substance and Sexual Activity  Alcohol Use No     Social History   Substance and Sexual Activity  Drug Use Not Currently  . Types: IV, Cocaine, Hydrocodone, Oxycodone, Marijuana, Heroin   Comment: takes methadone- now taking subutex (03/27/19)    Social History   Socioeconomic History  . Marital status: Single    Spouse name: Not on file  . Number of children: Not on file  . Years of education: Not on file  . Highest education level: Not on file  Occupational History  . Not on file  Tobacco Use  . Smoking status: Current Every Day Smoker    Years: 24.00    Types: Cigarettes, Cigars  . Smokeless tobacco: Never Used  . Tobacco comment: 1 cigar/daily. former 1 pack cigarette smoker quit 05/2019  currently smokes CBD  Vaping Use  . Vaping Use: Every day  . Substances: Nicotine, Flavoring  Substance and Sexual Activity  . Alcohol use: No  . Drug use: Not Currently    Types: IV, Cocaine, Hydrocodone, Oxycodone, Marijuana, Heroin    Comment: takes methadone- now taking subutex (03/27/19)  . Sexual activity: Not Currently    Birth control/protection: Abstinence  Other Topics Concern  . Not on file  Social History Narrative  . Not on file   Social Determinants of Health   Financial Resource Strain:   . Difficulty of Paying Living Expenses: Not on file  Food Insecurity:   . Worried About Programme researcher, broadcasting/film/video in the Last Year:  Not on file  . Ran Out of Food in the Last Year: Not on file  Transportation Needs:   . Lack of Transportation (Medical): Not on file  . Lack of Transportation (Non-Medical): Not on file  Physical Activity:   . Days of Exercise per Week: Not on file  . Minutes of Exercise per Session: Not on file  Stress:   . Feeling of Stress : Not on file  Social Connections:   . Frequency of Communication with Friends and Family: Not on file  . Frequency of Social Gatherings with Friends and Family: Not on file  . Attends Religious Services: Not on file  . Active Member of Clubs or Organizations: Not on file  . Attends Banker Meetings: Not on file  . Marital Status: Not on file   Additional Social History:    Allergies:  No Known Allergies  Labs:  No results found for this or any previous visit (from the past 48 hour(s)).  Current Facility-Administered Medications  Medication Dose Route Frequency Provider Last Rate Last Admin  . acetaminophen (TYLENOL) tablet 650 mg  650 mg  Per Tube Q6H PRN Glade Lloyd, MD   650 mg at 03/27/20 2221  . Chlorhexidine Gluconate Cloth 2 % PADS 6 each  6 each Topical Daily Gaynelle Adu, MD   6 each at 03/28/20 [phone]  . cyclobenzaprine (FLEXERIL) tablet 5 mg  5 mg Per Tube TID Russella Dar, NP   5 mg at 03/28/20 0831  . diclofenac Sodium (VOLTAREN) 1 % topical gel 4 g  4 g Topical QID Russella Dar, NP   4 g at 03/28/20 0837  . docusate (COLACE) 50 MG/5ML liquid 100 mg  100 mg Per Tube BID PRN Hanley Ben, Kshitiz, MD      . enoxaparin (LOVENOX) injection 40 mg  40 mg Subcutaneous Q24H [place], Alvira Philips, DO   40 mg at 03/28/20 1231  . famotidine (PEPCID) 40 MG/5ML suspension 20 mg  20 mg Per Tube BID Russella Dar, NP   20 mg at 03/28/20 7169  . feeding supplement (ENSURE ENLIVE / ENSURE PLUS) liquid 237 mL  237 mL Oral BID BM Russella Dar, NP   237 mL at 03/28/20 1214  . feeding supplement (PROSource TF) liquid 45 mL  45 mL Per Tube TID Russella Dar, NP   45 mL at 03/28/20 0833  . free water 400 mL  400 mL Per Tube Q6H Russella Dar, NP   400 mL at 03/28/20 1215  . Gerhardt's butt cream 1 application  1 application Topical TID Narda Bonds, MD   1 application at 03/28/20 0855  . Gerhardt's butt cream   Topical PRN Narda Bonds, MD   Given at 03/07/20 1540  . haloperidol lactate (HALDOL) injection 5 mg  5 mg Intramuscular Q6H PRN Kathlen Mody, MD   5 mg at 03/28/20 0656  . hydrocortisone-pramoxine (PROCTOFOAM-HC) rectal foam 1 applicator  1 applicator Rectal BID Russella Dar, NP   1 applicator at 03/28/20 1216  . hydrOXYzine (ATARAX/VISTARIL) tablet 10 mg  10 mg Per Tube TID Russella Dar, NP   10 mg at 03/28/20 1214  . hydrOXYzine (VISTARIL) injection 50 mg  50 mg Intramuscular Q6H PRN Russella Dar, NP   50 mg at 03/27/20 1047  . lacosamide (VIMPAT) tablet 200 mg  200 mg Per Tube BID Russella Dar, NP   200 mg at 03/28/20 6789  . LORazepam (ATIVAN) injection 2 mg  2 mg Intramuscular Q4H PRN Russella Dar, NP   2 mg at 03/22/20 2121  . naltrexone (DEPADE) tablet 50 mg  50 mg Per Tube Daily Russella Dar, NP   50 mg at 03/28/20 0833  . ondansetron (ZOFRAN) injection 4 mg  4 mg Intravenous Q6H PRN Gaynelle Adu, MD   4 mg at 03/18/20 1636  . PHENObarbital 20 MG/5ML elixir 60 mg  60 mg Per Tube BID Russella Dar, NP   60 mg at 03/28/20 0831  . phenytoin (DILANTIN) 125 MG/5ML suspension 200 mg  200 mg Per Tube BID Russella Dar, NP   200 mg at 03/28/20 3810  . potassium chloride (KLOR-CON) packet 40 mEq  40 mEq Per Tube Daily Dahal, Binaya, MD   40 mEq at 03/28/20 0830  . pyridOXINE (VITAMIN B-6) tablet 100 mg  100 mg Per Tube Daily Russella Dar, NP   100 mg at 03/28/20 0835  . QUEtiapine (SEROQUEL) tablet 100 mg  100 mg Per Tube QHS Russella Dar, NP      . QUEtiapine (SEROQUEL) tablet  50 mg  50 mg Per Tube Daily Russella Dar, NP   50 mg at 03/28/20 0831  . sodium chloride 0.9 % bolus 500 mL   500 mL Intravenous Once Kathlen Mody, MD      . thiamine tablet 100 mg  100 mg Per Tube Daily Russella Dar, NP   100 mg at 03/28/20 3662    Musculoskeletal: Strength & Muscle Tone: decreased Gait & Station: UTA Patient leans: N/A  Psychiatric Specialty Exam: Physical Exam   Review of Systems   Blood pressure 131/88, pulse 97, temperature 97.8 F (36.6 C), temperature source Axillary, resp. rate 17, height 5\' 5"  (1.651 m), weight 75.3 kg, SpO2 100 %.Body mass index is 27.62 kg/m.  General Appearance: Fairly Groomed  Eye Contact:  Minimal  Speech:  None  Volume:  Normal  Mood:  NA  Affect:  NA  Thought Process:  NA  Orientation:  NA  Thought Content:  NA  Suicidal Thoughts:  UTA  Homicidal Thoughts:  UTA  Memory:  NA  Judgement:  NA  Insight:  NA  Psychomotor Activity:  NA  Concentration:  UTA  Recall:  NA  Fund of Knowledge:  NA  Language:  NA  Akathisia:  NA  Handed:  Right  AIMS (if indicated):     Assets:  Others:  UTA  ADL's:  Impaired  Cognition:  Impaired,  Severe  Sleep:        Treatment Plan Summary: Plan Current etiology remains unclear. Although her history documents extensive use of long term opiates, methadone, and benzodiazapines. Since admission her mentation has waxed and waned, although there is no clear cut comparsion.  Will start Olanzapine 2.5mg  po BID, and DC seroquel. - COntinue prn IM Ativan and Hydroxyzine for prn agitation, aggression.  Disposition: Will closely continue to montior her, although there is limited history of psych documented with the exception of substance abuse as noted above.   , FNP 03/28/2020 1:01 PM

## 2020-03-28 NOTE — Progress Notes (Signed)
Physical Therapy Treatment [name] MRN: [id number] DOB: October 30, 1985 Today's Date: 03/28/2020    History of Present Illness 34 year old with past medical history significant for hepatitis C, polysubstance abuse brought to the hospital 9/13 for disorientation which was suspected to be substance abuse.  After she became more lucid, she was discharged, but then police brought her back later in the day and they found her passed out in a parking lot.  Urine drug screen was positive for benzodiazepine and THC. An EEG done on 9/15 showed patient was in status epilepticus.  Patient was a started on Keppra.  Despite these, EEG noted continued seizures requiring additional medications as well.  Finally on 9/18, seizures broke. Follow-up EEG on 9/20 noted evidence of epilepticity from the left central temporal region.  Repeat EEG done on 9/22 noted epileptogenicity from the left central temporal region.  Pt with PEG placed on 02/05/20.     PT Comments    Pt very irritated upon arrival trying to bite and hit the sitter and biting at the restraints stating "I am done. I am fucking done." Pt remains very impulsive and quick with impaired sequencing and processing in addition to poor safety awareness. Pt did sequence making instant oatmeal appropriately regarding steps, however put too much water, didn't cook long enough, and didn't remove spoon from cup when placing in microwave. Pt requiring verbal cues for safety however seems unphased as she ate soupy, uncooked, cold oatmeal. D/C plan remains appropriate. Acute PT to cont to follow.   Follow Up Recommendations  SNF;Supervision/Assistance - 24 hour     Equipment Recommendations       Recommendations for Other Services Rehab consult     Precautions / Restrictions Precautions Precautions: Fall Precaution Comments: Seizure, PEG, abdominal binder, 4 point restraints Restrictions Weight Bearing Restrictions: No    Mobility   Bed Mobility Overal bed mobility: Needs Assistance Bed Mobility: Supine to Sit     Supine to sit: Min guard Sit to supine: Min guard Sit to sidelying: Supervision General bed mobility comments: pt impulsively got out of bed at the end of the bed around the bed rail. pt give verbal and tactile cues to slow down and be safe however pt neglected to follow or listen  Transfers Overall transfer level: Needs assistance Equipment used: 1 person hand held assist Transfers: Sit to/from Stand Sit to Stand: Min guard         General transfer comment: min guard for safety, again impulsive and quick  Ambulation/Gait Ambulation/Gait assistance: Min assist Gait Distance (Feet): 100 Feet (to/from RN station) Assistive device: 1 person hand held assist Gait Pattern/deviations: Step-through pattern;Decreased stride length (decreased step height) Gait velocity: decreased Gait velocity interpretation: <1.31 ft/sec, indicative of household ambulator General Gait Details: pt impulsive but not episode of LOB, pt with short steps but not shuffling today   Stairs             Wheelchair Mobility    Modified Rankin (Stroke Patients Only)       Balance Overall balance assessment: Needs assistance Sitting-balance support: Feet supported;No upper extremity supported Sitting balance-Leahy Scale: Good Sitting balance - Comments: close supervision   Standing balance support: During functional activity;No upper extremity supported Standing balance-Leahy Scale: Good Standing balance comment: close minguard, no LOB noted                            Cognition Arousal/Alertness: Awake/alert Behavior During  Therapy: Restless;Impulsive Overall Cognitive Status: Impaired/Different from baseline Area of Impairment: Safety/judgement;Problem solving                   Current Attention Level: Selective   Following Commands: Follows multi-step commands with increased time;Follows  multi-step commands inconsistently Safety/Judgement: Decreased awareness of safety;Decreased awareness of deficits Awareness: Intellectual Problem Solving: Requires verbal cues;Requires tactile cues General Comments: upon arrival sitter requesting assistance as pt is trying to bite and hit her, pt stating "I'm fucking done." when asked if she wanted to make some oatmeal she said yes and calmed down      Exercises Other Exercises Other Exercises: issued pt work book of table top activities where pt instructed to track R<>L to work through maze activity, copy words and trace shapes. additionally issued pt coloring book with crayons with pt seeming to enjoy activity with sitter present    General Comments General comments (skin integrity, edema, etc.): pt was offered to make instant oatmeal, pt eager to do so. pt initiated all steps but required verbal guidance to complete safely and properly. in the end pt was eating soupy, uncooked oatmeal but enjoyed it      Pertinent Vitals/Pain Pain Assessment: Faces Faces Pain Scale: No hurt Pain Location: general discomfort Pain Descriptors / Indicators: Discomfort Pain Intervention(s): Limited activity within patient's tolerance;Monitored during session;Repositioned    Home Living                      Prior Function            PT Goals (current goals can now be found in the care plan section) Acute Rehab PT Goals Patient Stated Goal: none stated Progress towards PT goals: Progressing toward goals    Frequency    Min 2X/week      PT Plan Current plan remains appropriate    Co-evaluation              AM-PAC PT "6 Clicks" Mobility   Outcome Measure  Help needed turning from your back to your side while in a flat bed without using bedrails?: A Little Help needed moving from lying on your back to sitting on the side of a flat bed without using bedrails?: A Little Help needed moving to and from a bed to a chair  (including a wheelchair)?: A Little Help needed standing up from a chair using your arms (e.g., wheelchair or bedside chair)?: A Little Help needed to walk in hospital room?: A Little Help needed climbing 3-5 steps with a railing? : A Lot 6 Click Score: 17    End of Session   Activity Tolerance: Patient tolerated treatment well (but also limited by agitation and impulsivity) Patient left: in bed;with call bell/phone within reach;with nursing/sitter in room (left with OT) Nurse Communication: Mobility status;Precautions;Other (comment) PT Visit Diagnosis: Unsteadiness on feet (R26.81);Muscle weakness (generalized) (M62.81);Difficulty in walking, not elsewhere classified (R26.2);Other symptoms and signs involving the nervous system (R29.898);Other abnormalities of gait and mobility (R26.89)     Time: 4825-0037 PT Time Calculation (min) (ACUTE ONLY): 10 min  Charges:  $Gait Training: 8-22 mins                     Lewis Shock, PT, DPT Acute Rehabilitation Services Pager #: [phone] Office #: [phone]    Iona Hansen 03/28/2020, 4:08 PM

## 2020-03-28 NOTE — Progress Notes (Addendum)
[facility] PROGRESS NOTE  Anita Davis ZOX:[id number] DOB: 1985/12/17 DOA: 01/04/2020 PCP: [name], PA-C         Status: Remains inpatient appropriate because:Altered mental status and Unsafe d/c plan  Dispo:  The patient is from: Home              Anticipated d/c is to: SNF              Anticipated d/c date is: > 3 days              Patient currently is medically stable to d/c.  Barrier to discharge is need for SNF placement, Medicaid and disability applications in process   Code Status: Full Family Communication: Stepmother [name] on 12/06 DVT prophylaxis: Lovenox Vaccination status: Family confirmed initial Pfizer Covid vaccine dose given in June, second dose given on 11/2  Foley catheter: No  HPI: 34 year old female with known history of substance abuse on prior methadone, active hepatitis C and gestational diabetes who presented initially with confusion.  She was initially admitted to [facility] in September with a diagnosis of encephalopathy presumed secondary to drug overdose.  Subsequent work-up inconclusive.  LP without signs of infection, MRI brain unremarkable and unfortunately she remained encephalopathic.  EEG did reveal focal seizures.  AED therapy was initiated and seizures were suppressed and she was transferred to [facility] for continuous EEG.  Apparently had episodes of status epilelepticus before full suppression of seizures with medications.  Subsequently neurological recovery was poor and delayed.  Neurology suspicious for autoimmune encephalitis and was given high-dose steroids with equivocal improvement but deteriorated after steroids completed.  Was given IVIG without any significant improvement in mentation.  Since that time patient has had waxing and waning mentation ranging from unresponsive to alert confused and verbal and at times restless and agitated.  When she is awake/alert she is able to ambulate but requires assistance due to  impulsivity and gait instability.  Unfortunately these episodes of wakefulness are never sustained.  It is suspected that patient has a chronic sequela from her encephalopathy and seizures therefore long-term SNF has been recommended.  He is inconsistent with oral intake and continues to require tube feedings for nutrition and hydration.  Subjective: Alert.  Was incontinent of large amount of urine in bed.  Patient stood independently and subsequently was able to indicate that she wanted to go for a walk.  While walking she stated she was thirsty so she was given some cola.   Later I returned to patient's room.  I had promised her I would assist with application of eye make-up.  Allowed me to apply her eye make-up and then indicated once again she wanted to go for a walk.  We walked around the nursing unit she requested some hot coffee.  She later felt the coffee was too hot and asked if ice could be placed in her coffee.  While sitting at the nurses station she appeared to become anxious I asked her if she wanted to go back to her room and she stood up and we walked back to her room where she proceeded to sit in the chair and prepare for lunch.  1330 Pt reported insomnia and asked for something to help her sleep (hot shot was her request)-Ambien 5 mg prn HS ordered  Objective: Vitals:   03/28/20 0325 03/28/20 1236  BP: 103/72 131/88  Pulse: 76 97  Resp: 18 17  Temp: (!) 97.3 F (36.3 C) 97.8 F (36.6 C)  SpO2: 100% 100%    Intake/Output Summary (Last 24 hours) at 03/28/2020 1242 Last data filed at 03/28/2020 1021 Gross per 24 hour  Intake 60 ml  Output --  Net 60 ml   Filed Weights   03/18/20 0441 03/24/20 0432 03/26/20 0333  Weight: 71.3 kg 74.3 kg 75.3 kg    Exam:  Constitutional: NAD, remains restless and impulsive Respiratory: clear to auscultation bilaterally, no wheezing, no crackles. Normal respiratory effort. No accessory muscle use.  RA Cardiovascular: Regular rate and  rhythm, no murmurs / rubs / gallops. No extremity edema.  Skin warm and dry Abdomen: no tenderness, no masses palpated.  Bowel sounds positive.  Site unremarkable. Enlarged external hemorrhoids Musculoskeletal: no clubbing / cyanosis. No joint deformity upper and lower extremities. Good ROM,  Normal muscle tone.  Neurologic: CN 2-12 grossly intact. Sensation intact, Strength 5/5 x all 4 extremities.  Gait unsteady and patient has limited awareness of physical limitations while ambulating and is not safe to ambulate independently at this juncture. Psychiatric: Alert with short minimal verbal responses such as on Thursday.  Of note no more pelvic thrusting or hypersexual behavior noted.  Patient impulsive with activity but appeared to calm with restraints removed.  Ambulated as described above.   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  Maximized depression therapy with Zoloft and the addition of Abilify as recommended by psychiatry but no improvement and only contributed to ongoing lethargy therefore these medications have been tapered and discontinued.  Patient was also found to have dystonic movements with Abilify **upon review of the literature it was noted that in some patients with apathy (which she has demonstrated during the hospitalization) SSRI class of medications can actually worsen the apathy in brain injury patients.** -Attempted to utilize Ambien to improve wakefulness in brain injury patient but this was ineffective and appeared to cause increased agitation so was discontinued -While we are adjusting medications patient will require restraints  and safety sitter -Suspect behaviors worsened with utilization of benzodiazepines I have discontinued in favor of IM Vistaril initially followed by IM Haldol if agitation persists -Given behaviors consistent with hyper sexualization (patient with history of PTSD and narcotic abuse therefore prior to admission had abnormal stimulation of amygdala and hippocampus) initiated naltrexone 25 mg daily.  Dose increased to 50 mg on 12/3 after increased emotional lability including crying, inappropriately dancing in the halls and pelvic thrusting/hypersexual behaviors. -On 12/3 patient had become extremely agitated and was given Geodon 20 mg x 1 dose.  Instead of sedating patient this medication actually made patient alert and more appropriate.  She apparently had reemergence of similar behaviors on 12/5 and once again responded appropriately to this antipsychotic. -Prior to these episodes patient had been started on low-dose Seroquel and as of 12/6 daytime Seroquel will be increased to 50 mg and nighttime Seroquel has been increased to 100 mg.  We have also started low-dose Vistaril to scheduled per tube to assist with impulsivity.  Unfortunately patient had worsening of apathy and lethargy on SSRI so we will not utilize this medication -12/6 last night nursing staff documented that around 1:24 AM patient was quite alert and oriented x3 and was requesting food stating she was hungry.  Unfortunately when  patient was awake and just before 7 AM she had returned back to being awake but essentially minimally responsive. **I have discussed with provider on CIR who has extensive experience managing different types of brain injury patients.  Recommendation was to continue atypical antipsychotics and continue to monitor.  This class of patient can take months to years to show improvement as the brain "resets". D/w Pam/CIR- rec having pt fu with the [facility] if able to be dc'd home  Low back pain -As  above -Completed 5 days of ibuprofen -Continue Voltaren gel and add as needed Flexeril (will DC Robaxin since there is to be ineffective)  Salmonella UTI -Completed amoxicillin  Physical deconditioning -Inconsistent ability to participate with PT and OT- continues to require extensive assistance for all ADLs-we will need SNF at time of discharge  Suspected history of untreated chronic depression prior to admission/known polysubstance abuse -Attempted to maximize depression therapy but seem to make patient's mentation worse and did not improve symptoms therefore Zoloft 100 mg and Abilify 2 mg discontinued after being tapered  Dysphagia -Patient's father corresponded with palliative care and requested that eatings be discontinued since patient is more alert and eating more consistently.  We will discontinue tube feedings but continue Ensure protein supplementation and other protein supplements  Nutrition Status: Nutrition Problem: Inadequate oral intake Etiology: lethargy/confusion Signs/Symptoms: meal completion < 25% Interventions: Tube feeding, Prostat    Other problems: Nonobstructive transaminitis in context of hepatitis C -Elevated HCV antibody with markedly elevated HCV RNA quantitative level  consistent with chronic hepatitis C  -None total bilirubin normal with slightly elevated alkaline phosphatase with AST 165 and ALT 274 -11/10: Discussed with ID Dr. Manson PasseyMandahar.  Treatment of hepatitis C typically is initiated in the outpatient setting.  Medications can be quite expensive and usually take time to obtain insurance approval.  Also since patient likely go to skilled nursing facility expensive medication could be barrier to admission. -ID recommends scheduling outpatient appointment their clinic closer to discharge date  ? Breast enlargement/gynecomastia -No clinical evidence of breast enlargement -Prolactin level low with normal FSH -Appearance of breast enlargement reported  by family likely related to improve nutrition as well as abdominal binder moving up underneath breasts and malpositioning of breasts.  Diarrhea>>>Constipation Resolved Felt secondary to laxatives but given Salmonella UTI could have been related to Salmonella although at the time diarrhea was being worked up patient had no leukocytosis, no abdominal pain and no fevers.  Suspected oral Candida -Resolved after oral nystatin    Data Reviewed: Basic Metabolic Panel: Recent Labs  Lab 03/25/20 1248  NA 135  K 4.6  CL 100  CO2 24  GLUCOSE 289*  BUN 12  CREATININE 0.65  CALCIUM 8.7*   Liver Function Tests: Recent Labs  Lab 03/25/20 1248  AST 114*  ALT 158*  ALKPHOS 150*  BILITOT 0.4  PROT 6.4*  ALBUMIN 3.6   No results for input(s): LIPASE, AMYLASE in the last 168 hours. No results for input(s): AMMONIA in the last 168 hours. CBC: Recent Labs  Lab 03/25/20 1248  WBC 5.8  NEUTROABS 3.2  HGB 13.3  HCT 38.0  MCV 93.6  PLT 343   Cardiac Enzymes: No results for input(s): CKTOTAL, CKMB, CKMBINDEX, TROPONINI in the last 168 hours. BNP (last 3 results) No results for input(s): BNP in the last 8760 hours.  ProBNP (last 3 results) No results for input(s): PROBNP in the last 8760 hours.  CBG: Recent Labs  Lab 03/24/20 1609  GLUCAP 142*    No results found for this or any previous visit (from the past 240 hour(s)).   Studies: No results found.  Scheduled Meds: . Chlorhexidine Gluconate Cloth  6 each Topical Daily  . cyclobenzaprine  5 mg Per Tube TID  . diclofenac Sodium  4 g Topical QID  . enoxaparin (LOVENOX) injection  40 mg Subcutaneous Q24H  . famotidine  20 mg Per Tube BID  . feeding supplement  237 mL Oral BID BM  . feeding supplement (PROSource TF)  45 mL Per Tube TID  . free water  400 mL Per Tube Q6H  . Gerhardt's butt cream  1 application Topical TID  . hydrocortisone-pramoxine  1 applicator Rectal BID  . hydrOXYzine  10 mg Per Tube TID  .  lacosamide  200 mg Per Tube BID  . naltrexone  50 mg Per Tube Daily  . PHENObarbital  60 mg Per Tube BID  . phenytoin  200 mg Per Tube BID  . potassium chloride  40 mEq Per Tube Daily  . vitamin B-6  100 mg Per Tube Daily  . QUEtiapine  100 mg Per Tube QHS  . QUEtiapine  50 mg Per Tube Daily  . thiamine  100 mg Per Tube Daily   Continuous Infusions: . sodium chloride      Principal Problem:   Refractory seizure (HCC) Active Problems:   Acute metabolic encephalopathy   Hypokalemia   Polysubstance abuse (HCC)   Elevated CK   Transaminitis   Chronic hepatitis C without hepatic coma (HCC)   Distended abdomen   Palliative care encounter   Colonic Ileus (HCC)   Organic brain syndrome   On enteral nutrition   Physical deconditioning   Protein-calorie malnutrition (HCC)   Inadequate oral nutritional intake   Consultants:  Neurology  Psychiatry  Interventional radiology  Surgery  Procedures:  9/14 lumbar puncture  9/15 EEG  9/16 EEG  9/17 EEG  9/19 overnight EEG with video  9/22 overnight EEG with video with discontinuation of long-term EEG monitoring on 9/25  9/24 core track placement  10/6 EEG  Antibiotics: Anti-infectives (From admission, onward)   Start     Dose/Rate Route Frequency Ordered Stop   03/19/20 1000  metroNIDAZOLE (FLAGYL) tablet 500 mg  Status:  Discontinued        500 mg Per Tube 2 times daily 03/19/20 0822 03/23/20 0827   03/18/20 1200  amoxicillin (AMOXIL) 250 MG/5ML suspension 500 mg  Status:  Discontinued        500 mg Per Tube Every 8 hours 03/18/20 0915 03/23/20 0559   03/17/20 1200  cefTRIAXone (ROCEPHIN) 1 g in sodium chloride 0.9 % 100 mL IVPB  Status:  Discontinued        1 g 200 mL/hr over 30 Minutes Intravenous Every 24 hours 03/17/20 1048 03/18/20 0913   03/16/20 1130  metroNIDAZOLE (FLAGYL) 50 mg/ml oral suspension 500 mg  Status:  Discontinued        500 mg Per Tube 2 times daily 03/16/20 1040 03/19/20 0821   03/16/20  1130  fluconazole (DIFLUCAN) 40 MG/ML suspension 152 mg        150 mg Per Tube  Once 03/16/20 1040 03/16/20 1245   02/11/20 1526  ceFAZolin (ANCEF) IVPB 2g/100 mL premix        2 g 200 mL/hr over 30 Minutes Intravenous  Once 02/11/20 1526 02/11/20 1641   02/05/20 0600  ceFAZolin (ANCEF) IVPB 2g/100 mL premix  2 g 200 mL/hr over 30 Minutes Intravenous To Short Stay 02/04/20 1054 02/05/20 0950   01/29/20 0600  ceFAZolin (ANCEF) IVPB 2g/100 mL premix        2 g 200 mL/hr over 30 Minutes Intravenous On call to O.R. 01/28/20 1511 01/30/20 0559   01/28/20 2000  cefTRIAXone (ROCEPHIN) 1 g in sodium chloride 0.9 % 100 mL IVPB        1 g 200 mL/hr over 30 Minutes Intravenous Every 24 hours 01/28/20 1925 02/02/20 0724   01/06/20 0900  cefTRIAXone (ROCEPHIN) 2 g in sodium chloride 0.9 % 100 mL IVPB  Status:  Discontinued        2 g 200 mL/hr over 30 Minutes Intravenous Every 12 hours 01/06/20 0105 01/06/20 2202   01/06/20 0800  vancomycin (VANCOCIN) IVPB 1000 mg/200 mL premix  Status:  Discontinued       "Followed by" Linked Group Details   1,000 mg 200 mL/hr over 60 Minutes Intravenous Every 12 hours 01/05/20 1904 01/06/20 2202   01/05/20 2000  vancomycin (VANCOREADY) IVPB 1500 mg/300 mL       "Followed by" Linked Group Details   1,500 mg 150 mL/hr over 120 Minutes Intravenous  Once 01/05/20 1904 01/06/20 0127   01/05/20 1830  cefTRIAXone (ROCEPHIN) 2 g in sodium chloride 0.9 % 100 mL IVPB        2 g 200 mL/hr over 30 Minutes Intravenous  Once 01/05/20 1829 01/05/20 2220       Time spent: 35 minutes    [name] ANP  [facility] Pager [phone].  03/28/2020, 12:42 PM  LOS: 82 days

## 2020-03-29 LAB — HEMOGLOBIN A1C
Hgb A1c MFr Bld: 5.8 % — ABNORMAL HIGH (ref 4.8–5.6)
Mean Plasma Glucose: 119.76 mg/dL

## 2020-03-29 NOTE — Plan of Care (Signed)
  Problem: Education: Goal: Knowledge of General Education information will improve Description: Including pain rating scale, medication(s)/side effects and non-pharmacologic comfort measures Outcome: Progressing   Problem: Activity: Goal: Risk for activity intolerance will decrease Outcome: Progressing   Problem: Elimination: Goal: Will not experience complications related to bowel motility Outcome: Progressing Goal: Will not experience complications related to urinary retention Outcome: Progressing   

## 2020-03-29 NOTE — Progress Notes (Signed)
Patient is alert and oriented during shift assessment, she is noted to be restless all day. Patient was seen biting on bed rail and phone cord, and also moving her body uncontrollable. IM haldol 5 mg was given to patient 1344 for agitation and restlessness. Patient tolerated medication well with no side effect noted. Patient continues to have 1:1 nurse tech to monitor her. Patient is in room at this time. Will continue to monitor. Patient ate most of her meals today

## 2020-03-29 NOTE — TOC Progression Note (Signed)
Transition of Care [facility]) - Progression Note    Patient Details  Name: Amesha Bailey MRN: [id number] Date of Birth: 1986-01-15  [facility] CM/SW Contact  Kermit Balo, RN Phone Number: 03/29/2020, 9:16 AM  Clinical Narrative:    CM messaged financial counseling and pts medicaid is still pending.  Pt currently without any bed offers.  TOC following.   Expected Discharge Plan: Skilled Nursing Facility Barriers to Discharge: Continued Medical Work up, Inadequate or no insurance, Active Substance Use - Placement, SNF Pending payor source - LOG, SNF Pending Medicaid, SNF Pending bed offer  Expected Discharge Plan and Services Expected Discharge Plan: Skilled Nursing Facility In-house Referral: Financial Counselor   Post Acute Care Choice: Skilled Nursing Facility Living arrangements for the past 2 months: Mobile Home                                       Social Determinants of Health (SDOH) Interventions    Readmission Risk Interventions No flowsheet data found.

## 2020-03-29 NOTE — Progress Notes (Signed)
At 0640am pt repeatedly jumping out of bed without assistance. Pt is alert and oriented x3. Pt is thrusting her hips on the bed back and forth and fondling with her private areas. Pt shaking her bottom in the bed to staff and kicking the bed.  RN gave prn medication for agitation. Sitter at bedside with tech.Pt in no acute distress.  RN will continue to monitor pt.

## 2020-03-29 NOTE — Progress Notes (Addendum)
[facility] PROGRESS NOTE  Anita Davis ION:[id number] DOB: 1985-10-22 DOA: 01/04/2020 PCP: [name], PA-C         Status: Remains inpatient appropriate because:Altered mental status and Unsafe d/c plan  Dispo:  The patient is from: Home              Anticipated d/c is to: SNF vs home with family              Anticipated d/c date is: > 3 days              Patient currently is medically stable to d/c.  Barrier to discharge is need for SNF placement, Medicaid and disability applications in process   Code Status: Full Family Communication: Stepmother [name] on 12/06 DVT prophylaxis: Lovenox Vaccination status: Family confirmed initial Pfizer Covid vaccine dose given in June, second dose given on 11/2  Foley catheter: No  HPI: 33 year old female with known history of substance abuse on prior methadone, active hepatitis C and gestational diabetes who presented initially with confusion.  She was initially admitted to [facility] in September with a diagnosis of encephalopathy presumed secondary to drug overdose.  Subsequent work-up inconclusive.  LP without signs of infection, MRI brain unremarkable and unfortunately she remained encephalopathic.  EEG did reveal focal seizures.  AED therapy was initiated and seizures were suppressed and she was transferred to [facility] for continuous EEG.  Apparently had episodes of status epilelepticus before full suppression of seizures with medications.  Subsequently neurological recovery was poor and delayed.  Neurology suspicious for autoimmune encephalitis and was given high-dose steroids with equivocal improvement but deteriorated after steroids completed.  Was given IVIG without any significant improvement in mentation.  Since that time patient has had waxing and waning mentation ranging from unresponsive to alert confused and verbal and at times restless and agitated.  When she is awake/alert she is able to ambulate but requires  assistance due to impulsivity and gait instability.  Unfortunately these episodes of wakefulness are never sustained.  It is suspected that patient has a chronic sequela from her encephalopathy and seizures therefore long-term SNF has been recommended.  He is inconsistent with oral intake and continues to require tube feedings for nutrition and hydration.  Subjective: Alert.  Occasionally anxious and impulsive with behaviors improving when allowed to walk with assistance in her room and on the unit.  Anxious behaviors increase if restraints are applied.  Patient requesting access to computer and while walking in the halls repeatedly attempts to sit down at computer station.  Explained to her that these computers do not allow her to access the Internet.  Objective: Vitals:   03/29/20 0420 03/29/20 0827  BP: 100/65 (!) 122/92  Pulse: 64 88  Resp: 16 18  Temp: 98.1 F (36.7 C) 97.9 F (36.6 C)  SpO2:  100%    Intake/Output Summary (Last 24 hours) at 03/29/2020 1123 Last data filed at 03/29/2020 0900 Gross per 24 hour  Intake 895 ml  Output 0 ml  Net 895 ml   Filed Weights   03/24/20 0432 03/26/20 0333 03/29/20 0500  Weight: 74.3 kg 75.3 kg 72.5 kg    Exam:  Constitutional: NAD, remains restless and impulsive Respiratory: clear to auscultation bilaterally. Normal respiratory effort. No accessory muscle use.  RA Cardiovascular: Regular rate and rhythm, no murmurs / rubs / gallops. No extremity edema.  Skin warm and dry Abdomen: no tenderness, no masses palpated.  Bowel sounds positive.  PEG tube in place.  Enlarged external hemorrhoids improving with medication Musculoskeletal: no clubbing / cyanosis. No joint deformity upper and lower extremities. Good ROM,  Normal muscle tone.  Neurologic: CN 2-12 grossly intact. Sensation intact, Strength 5/5 x all 4 extremities.  Improvement in gait.  Occasionally becomes overstimulated and either begins shaking, has rapid purposeful head movements,  and I did witness 1 episode of pelvic thrusting/rocking but this resolved when I asked patient if she was nervous and would like to go for a walk and she stated "yes" Psychiatric: Alert with short minimal verbal responses-sponsors are appropriate to conversation and situation at hand.  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  Maximized depression therapy with Zoloft and the addition of Abilify as recommended by psychiatry but no improvement and only contributed to ongoing lethargy therefore these medications have been tapered and discontinued.  Patient was also found to have dystonic movements with Abilify **upon review of the literature it was noted that in some patients with apathy (which she has demonstrated during the hospitalization) SSRI class of medications can actually worsen the apathy in brain injury patients.** -Behaviors worsened with utilization of benzodiazepines I have discontinued in favor of IM Vistaril initially followed by IM Haldol if agitation persists -Given behaviors consistent with hyper sexualization (patient with history of PTSD and narcotic abuse therefore prior to admission ie chronic overstimulation of amygdala/hippocampus) initiated naltrexone 25 mg daily.  Dose increased to 50 mg on 12/3 after increased emotional lability including crying, inappropriately dancing in the halls and pelvic thrusting/hypersexual behaviors. -On 12/3 patient had become extremely agitated and was given Geodon 20 mg x 1 dose.  Instead of sedating patient this medication actually made patient alert and more  appropriate.  She apparently had reemergence of similar behaviors on 12/5 and once again responded appropriately to this antipsychotic. -Unfortunately patient had worsening of apathy and lethargy on SSRI so we will not utilize this medication -12/6 evaluated by psychiatry who discontinued Seroquel in favor of Zyprexa -Continue low-dose scheduled Vistaril to help with impulsivity -Continue one-to-one safety sitter because of impulsivity increased fall risk include wandering behaviors D/w Pam/CIR- [facility] if able to be dc'd home  Hyperglycemia secondary to medication -Patient is having intermittent hyperglycemia with sugars in the high 250s at time on serum glucose -Repeated hemoglobin A1c -5.8 which is consistent with prediabetes -Antipsychotics such as Seroquel and Zyprexa can cause hyperglycemia and drug-related diabetes mellitus we will continue to follow closely; patient has been having increased thirst and increased urination  Physical deconditioning -Inconsistent ability to participate with PT and OT- continues to require extensive assistance for all ADLs-we will need SNF at time of discharge  Suspected history of untreated chronic depression prior to admission/known polysubstance abuse -Attempted to maximize depression therapy but seem to make patient's mentation worse and did not improve symptoms therefore Zoloft 100 mg and Abilify 2 mg discontinued after being tapered  Dysphagia -Patient's father corresponded with palliative care and requested that eatings be discontinued since patient is more alert and eating more consistently.  We will discontinue tube feedings but continue Ensure protein supplementation and other protein supplements -Given behavioral issues we will leave PEG  tube in place for medications until patient consistently takes medications by mouth  Nutrition Status: Nutrition Problem: Inadequate oral intake Etiology:  lethargy/confusion Signs/Symptoms: meal completion < 25% Interventions: Tube feeding, Prostat-12/6 tube feeding discontinued in favor of regular diet, prostat supplement and Ensure supplements      Other problems: Nonobstructive transaminitis in context of hepatitis C -Elevated HCV antibody with markedly elevated HCV RNA quantitative level  consistent with chronic hepatitis C  -None total bilirubin normal with slightly elevated alkaline phosphatase with AST 165 and ALT 274 -11/10: Discussed with ID Dr. Manson Passey.  Treatment of hepatitis C typically is initiated in the outpatient setting.  Medications can be quite expensive and usually take time to obtain insurance approval.  Also since patient likely go to skilled nursing facility expensive medication could be barrier to admission. -ID recommends scheduling outpatient appointment their clinic closer to discharge date  ? Breast enlargement/gynecomastia -No clinical evidence of breast enlargement -Prolactin level low with normal FSH -Appearance of breast enlargement reported by family likely related to improve nutrition as well as abdominal binder moving up underneath breasts and malpositioning of breasts.  Diarrhea>>>Constipation Resolved Felt secondary to laxatives but given Salmonella UTI could have been related to Salmonella although at the time diarrhea was being worked up patient had no leukocytosis, no abdominal pain and no fevers.  Suspected oral Candida -Resolved after oral nystatin  Low back pain -Resolved -Completed 5 days of ibuprofen -Continue Voltaren gel and add as needed Flexeril (will DC Robaxin since there is to be ineffective)  Salmonella UTI -Completed amoxicillin    Data Reviewed: Basic Metabolic Panel: Recent Labs  Lab 03/25/20 1248  NA 135  K 4.6  CL 100  CO2 24  GLUCOSE 289*  BUN 12  CREATININE 0.65  CALCIUM 8.7*   Liver Function Tests: Recent Labs  Lab 03/25/20 1248  AST 114*  ALT 158*   ALKPHOS 150*  BILITOT 0.4  PROT 6.4*  ALBUMIN 3.6   No results for input(s): LIPASE, AMYLASE in the last 168 hours. No results for input(s): AMMONIA in the last 168 hours. CBC: Recent Labs  Lab 03/25/20 1248  WBC 5.8  NEUTROABS 3.2  HGB 13.3  HCT 38.0  MCV 93.6  PLT 343   Cardiac Enzymes: No results for input(s): CKTOTAL, CKMB, CKMBINDEX, TROPONINI in the last 168 hours. BNP (last 3 results) No results for input(s): BNP in the last 8760 hours.  ProBNP (last 3 results) No results for input(s): PROBNP in the last 8760 hours.  CBG: Recent Labs  Lab 03/24/20 1609  GLUCAP 142*    No results found for this or any previous visit (from the past 240 hour(s)).   Studies: No results found.  Scheduled Meds: . Chlorhexidine Gluconate Cloth  6 each Topical Daily  . cyclobenzaprine  5 mg Per Tube TID  . diclofenac Sodium  4 g Topical QID  . enoxaparin (LOVENOX) injection  40 mg Subcutaneous Q24H  . famotidine  20 mg Per Tube BID  . feeding supplement  237 mL Oral TID BM  . feeding supplement (PROSource TF)  45 mL Per Tube TID  . free water  400 mL Per Tube Q6H  . Gerhardt's butt cream  1 application Topical TID  . hydrocortisone-pramoxine  1 applicator Rectal BID  . hydrOXYzine  10 mg Per Tube TID  . lacosamide  200 mg Per Tube BID  . naltrexone  50 mg Per Tube Daily  . OLANZapine  2.5 mg Oral BID  .  PHENObarbital  60 mg Per Tube BID  . phenytoin  200 mg Per Tube BID  . potassium chloride  40 mEq Per Tube Daily  . vitamin B-6  100 mg Per Tube Daily  . thiamine  100 mg Per Tube Daily   Continuous Infusions: . sodium chloride      Principal Problem:   Refractory seizure (HCC) Active Problems:   Acute metabolic encephalopathy   Hypokalemia   Polysubstance abuse (HCC)   Elevated CK   Transaminitis   Chronic hepatitis C without hepatic coma (HCC)   Distended abdomen   Palliative care encounter   Colonic Ileus (HCC)   Organic brain syndrome   On enteral  nutrition   Physical deconditioning   Protein-calorie malnutrition (HCC)   Inadequate oral nutritional intake   Consultants:  Neurology  Psychiatry  Interventional radiology  Surgery  Procedures:  9/14 lumbar puncture  9/15 EEG  9/16 EEG  9/17 EEG  9/19 overnight EEG with video  9/22 overnight EEG with video with discontinuation of long-term EEG monitoring on 9/25  9/24 core track placement  10/6 EEG  Antibiotics: Anti-infectives (From admission, onward)   Start     Dose/Rate Route Frequency Ordered Stop   03/19/20 1000  metroNIDAZOLE (FLAGYL) tablet 500 mg  Status:  Discontinued        500 mg Per Tube 2 times daily 03/19/20 0822 03/23/20 0827   03/18/20 1200  amoxicillin (AMOXIL) 250 MG/5ML suspension 500 mg  Status:  Discontinued        500 mg Per Tube Every 8 hours 03/18/20 0915 03/23/20 0559   03/17/20 1200  cefTRIAXone (ROCEPHIN) 1 g in sodium chloride 0.9 % 100 mL IVPB  Status:  Discontinued        1 g 200 mL/hr over 30 Minutes Intravenous Every 24 hours 03/17/20 1048 03/18/20 0913   03/16/20 1130  metroNIDAZOLE (FLAGYL) 50 mg/ml oral suspension 500 mg  Status:  Discontinued        500 mg Per Tube 2 times daily 03/16/20 1040 03/19/20 0821   03/16/20 1130  fluconazole (DIFLUCAN) 40 MG/ML suspension 152 mg        150 mg Per Tube  Once 03/16/20 1040 03/16/20 1245   02/11/20 1526  ceFAZolin (ANCEF) IVPB 2g/100 mL premix        2 g 200 mL/hr over 30 Minutes Intravenous  Once 02/11/20 1526 02/11/20 1641   02/05/20 0600  ceFAZolin (ANCEF) IVPB 2g/100 mL premix        2 g 200 mL/hr over 30 Minutes Intravenous To Short Stay 02/04/20 1054 02/05/20 0950   01/29/20 0600  ceFAZolin (ANCEF) IVPB 2g/100 mL premix        2 g 200 mL/hr over 30 Minutes Intravenous On call to O.R. 01/28/20 1511 01/30/20 0559   01/28/20 2000  cefTRIAXone (ROCEPHIN) 1 g in sodium chloride 0.9 % 100 mL IVPB        1 g 200 mL/hr over 30 Minutes Intravenous Every 24 hours 01/28/20 1925  02/02/20 0724   01/06/20 0900  cefTRIAXone (ROCEPHIN) 2 g in sodium chloride 0.9 % 100 mL IVPB  Status:  Discontinued        2 g 200 mL/hr over 30 Minutes Intravenous Every 12 hours 01/06/20 0105 01/06/20 2202   01/06/20 0800  vancomycin (VANCOCIN) IVPB 1000 mg/200 mL premix  Status:  Discontinued       "Followed by" Linked Group Details   1,000 mg 200 mL/hr over 60 Minutes Intravenous  Every 12 hours 01/05/20 1904 01/06/20 2202   01/05/20 2000  vancomycin (VANCOREADY) IVPB 1500 mg/300 mL       "Followed by" Linked Group Details   1,500 mg 150 mL/hr over 120 Minutes Intravenous  Once 01/05/20 1904 01/06/20 0127   01/05/20 1830  cefTRIAXone (ROCEPHIN) 2 g in sodium chloride 0.9 % 100 mL IVPB        2 g 200 mL/hr over 30 Minutes Intravenous  Once 01/05/20 1829 01/05/20 2220       Time spent: 30 minutes    [name] ANP  [facility] Pager [phone].  03/29/2020, 11:23 AM  LOS: 83 days

## 2020-03-30 MED ORDER — ZOLPIDEM TARTRATE 5 MG PO TABS: 5.0000 mg | ORAL_TABLET | Freq: Every evening | ORAL | Status: DC | PRN

## 2020-03-30 MED ORDER — OLANZAPINE 2.5 MG PO TABS
5.0000 mg | ORAL_TABLET | Freq: Two times a day (BID) | ORAL | Status: DC
  Administered 2020-03-30: 5 mg via ORAL
  Filled 2020-03-30: qty 2

## 2020-03-30 MED ORDER — ZIPRASIDONE MESYLATE 20 MG IM SOLR
20.0000 mg | Freq: Once | INTRAMUSCULAR | Status: AC
  Administered 2020-03-30: 20 mg via INTRAMUSCULAR
  Filled 2020-03-30 (×2): qty 20

## 2020-03-30 MED ORDER — STERILE WATER FOR INJECTION IJ SOLN
INTRAMUSCULAR | Status: AC
  Administered 2020-03-30: 1.2 mL
  Filled 2020-03-30: qty 10

## 2020-03-30 MED ORDER — OLANZAPINE 2.5 MG PO TABS
5.0000 mg | ORAL_TABLET | Freq: Two times a day (BID) | ORAL | Status: DC
  Administered 2020-03-30: 5 mg
  Filled 2020-03-30: qty 2

## 2020-03-30 MED ORDER — ADULT MULTIVITAMIN W/MINERALS CH
1.0000 | ORAL_TABLET | Freq: Every day | ORAL | Status: DC
  Administered 2020-03-30: 1

## 2020-03-30 NOTE — Progress Notes (Addendum)
[facility] PROGRESS NOTE  Anita Davis QMV:[id number] DOB: 05-Oct-1985 DOA: 01/04/2020 PCP: [name], PA-C            12/8 Past few days Anita Davis's alertness and mentation has improved.  For the most part she is nonverbal especially when stressed but is able to express needs and desires and short bursts and phrases she has significant impulsivity and agitation and anxiety are decreased most of the time when ambulated in halls.    Status: Remains inpatient appropriate because:Altered mental status and Unsafe d/c plan  Dispo:  The patient is from: Home              Anticipated d/c is to: SNF vs home with family              Anticipated d/c date is: > 3 days              Patient currently is medically stable to d/c.  Barrier to discharge is need for SNF placement, Medicaid and disability applications in process   Code Status: Full Family Communication: Stepmother Anita Davis on 12/08 at bedside.  She has been updated on med changes and significant improvements witnessed in patient's behavior and appetite since we last spoke.  She will try to obtain tablet for patient used to play games and communicate at bedside.  She is agreeable to considering discharge home pending continued improvement in patient's behavior. DVT prophylaxis: Lovenox Vaccination status: Family confirmed initial Pfizer Covid vaccine dose given in June, second dose given on 11/2  Foley catheter: No  HPI: 34 year old female with known history of substance abuse on prior methadone, active hepatitis C and gestational diabetes who presented initially with confusion.  She was initially admitted to [facility] in September with a diagnosis of encephalopathy presumed secondary to drug overdose.  Subsequent work-up inconclusive.  LP without signs of infection, MRI brain unremarkable and unfortunately she remained encephalopathic.  EEG did reveal focal seizures.  AED therapy was initiated and seizures were  suppressed and she was transferred to [facility] for continuous EEG.  Apparently had episodes of status epilelepticus before full suppression of seizures with medications.  Subsequently neurological recovery was poor and delayed.  Neurology suspicious for autoimmune encephalitis and was given high-dose steroids with equivocal improvement but deteriorated after steroids completed.  Was given IVIG without any significant improvement in mentation.  Since that time patient has had waxing and waning mentation ranging from unresponsive to alert confused and verbal and at times restless and agitated.  When she is awake/alert she is able to ambulate but requires assistance due to impulsivity and gait instability.  Unfortunately these episodes of wakefulness are never sustained.  It is suspected that patient has a chronic sequela from her encephalopathy and seizures therefore long-term SNF has been recommended.  He is inconsistent with oral intake and continues to require tube feedings for nutrition and hydration.  Subjective: Alert.  Fidgety and sitting on side of bed.  The most part is oriented although thought it was the month of August.  After I began talking with her her anxiety increased after I told her that I would not be able to take her for a walk today due to other responsibilities.  She began shaking her head rapidly and holding onto my hand and writhing in the bed with some minor pelvic thrusting and hypersexual behaviors.  Objective: Vitals:   03/30/20 0856 03/30/20 1308  BP: (!) 134/95 135/88  Pulse:  96  Resp: 18 18  Temp:  97.6 F (36.4 C)  SpO2: 100% 100%    Intake/Output Summary (Last 24 hours) at 03/30/2020 1340 Last data filed at 03/30/2020 1100 Gross per 24 hour  Intake 600 ml  Output --  Net 600 ml   Filed Weights   03/24/20 0432 03/26/20 0333 03/29/20 0500  Weight: 74.3 kg 75.3 kg 72.5 kg    Exam:  Constitutional: NAD, alternates between calm and restless associated  impulsive Respiratory: clear to auscultation bilaterally. Normal respiratory effort. No accessory muscle use.  RA Cardiovascular: Regular rate and rhythm, no murmurs / rubs / gallops. No extremity edema.  Skin warm and dry Abdomen: no tenderness, no masses palpated.  Bowel sounds positive.  PEG tube in place.  Enlarged external hemorrhoids improving with medication Musculoskeletal: no clubbing / cyanosis. No joint deformity upper and lower extremities. Good ROM,  Normal muscle tone.  Neurologic: CN 2-12 grossly intact. Sensation intact, Strength 5/5 x all 4 extremities.  Improvement in gait.   Psychiatric: Alert and oriented times name, year and place.  Initially thought it was August became anxious and easily agitated when explained I would not be able to stay in room and work with her as long as I typically do or be able to take her for a walk in the nursing unit she developed some rapid head shaking, grasping of my hands and writhing in the bed with some pelvic thrusting and other hypersexual behaviors.  Nursing staff also showed me some pictures that patient had been coloring.  There were several concerning statements that included self-deprecating statements, concerns over messing up "really bad".  And a statement that said "I want to kill myself".   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  Maximized depression therapy with Zoloft and the addition of Abilify only contributed to ongoing lethargy therefore tapered and discontinued.  Noted with dystonic movements with Abilify was discontinued -Behaviors worsened with utilization of benzodiazepines I have  discontinued in favor of IM Vistaril initially followed by IM Haldol if agitation persists.  Today 12/8 ordered Geodon 20 mg IM x1 for evolving agitation, emotional lability and hypersexual behavior -Initiated naltrexone this admission given behaviors consistent with hyper sexualization (patient with history of PTSD and narcotic abuse therefore prior to admission ie chronic overstimulation of amygdala/hippocampus). -Patient has excellent response to Geodon prn -Unfortunately patient had worsening of apathy and lethargy on SSRI so we will not utilize this medication -12/6 psychiatry discontinued Seroquel in favor of Zyprexa-as of 12/8 will increase Zyprexa to 5 mg BID -Continue low-dose scheduled Vistaril to help with impulsivity -Continue one-to-one safety sitter because of impulsivity increased fall risk include wandering behaviors D/w Pam/CIR- rec having pt fu with the [facility] if able to be dc'd home  Hyperglycemia secondary to medication -Patient is having intermittent hyperglycemia with sugars in the high 250s at time on serum glucose -Repeated hemoglobin A1c -5.8 which is consistent with prediabetes -Antipsychotics such as Seroquel and Zyprexa can cause hyperglycemia and drug-related diabetes mellitus we will continue to follow closely; patient has been having increased thirst and increased urination  Physical deconditioning -Inconsistent ability to participate with PT and OT- continues to require extensive assistance for all ADLs-we will need SNF at time of  discharge  Acute on chronic depression prior to admission/known polysubstance abuse/suicidal ideation -Attempted to maximize depression therapy but seem to make patient's mentation worse and did not improve symptoms therefore Zoloft 100 mg and Abilify 2 mg discontinued after being tapered -Patient with behaviors as well as written statements concerning for worsening depression as well as suicidal ideation although suspected this  is primarily related to her brain injury -Psychiatric team updated and agrees with current medication regimen and safety plan in place with one-to-one sitter, removal of any sharp objects from room and utilization of disposable meal tray  Dysphagia -Continue regular diet -Given behavioral issues we will leave PEG tube in place for medications until patient consistently takes medications by mouth  Nutrition Status: Nutrition Problem: Inadequate oral intake Etiology: lethargy/confusion Signs/Symptoms: meal completion < 25% Interventions: Tube feeding, Prostat-12/6 tube feeding discontinued in favor of regular diet, prostat supplement and Ensure supplements      Other problems: Nonobstructive transaminitis in context of hepatitis C -Elevated HCV antibody with markedly elevated HCV RNA quantitative level  consistent with chronic hepatitis C  -None total bilirubin normal with slightly elevated alkaline phosphatase with AST 165 and ALT 274 -11/10: Discussed with ID Dr. Manson PasseyMandahar.  Treatment of hepatitis C typically is initiated in the outpatient setting.  Medications can be quite expensive and usually take time to obtain insurance approval.  Also since patient likely go to skilled nursing facility expensive medication could be barrier to admission. -ID recommends scheduling outpatient appointment their clinic closer to discharge date  ? Breast enlargement/gynecomastia -No clinical evidence of breast enlargement -Prolactin level low with normal FSH -Appearance of breast enlargement reported by family likely related to improve nutrition as well as abdominal binder moving up underneath breasts and malpositioning of breasts.  Diarrhea>>>Constipation Resolved Felt secondary to laxatives but given Salmonella UTI could have been related to Salmonella although at the time diarrhea was being worked up patient had no leukocytosis, no abdominal pain and no fevers.  Suspected oral Candida -Resolved  after oral nystatin  Low back pain -Resolved -Completed 5 days of ibuprofen -Continue Voltaren gel and add as needed Flexeril (will DC Robaxin since there is to be ineffective)  Salmonella UTI -Completed amoxicillin    Data Reviewed: Basic Metabolic Panel: Recent Labs  Lab 03/25/20 1248  NA 135  K 4.6  CL 100  CO2 24  GLUCOSE 289*  BUN 12  CREATININE 0.65  CALCIUM 8.7*   Liver Function Tests: Recent Labs  Lab 03/25/20 1248  AST 114*  ALT 158*  ALKPHOS 150*  BILITOT 0.4  PROT 6.4*  ALBUMIN 3.6   No results for input(s): LIPASE, AMYLASE in the last 168 hours. No results for input(s): AMMONIA in the last 168 hours. CBC: Recent Labs  Lab 03/25/20 1248  WBC 5.8  NEUTROABS 3.2  HGB 13.3  HCT 38.0  MCV 93.6  PLT 343   Cardiac Enzymes: No results for input(s): CKTOTAL, CKMB, CKMBINDEX, TROPONINI in the last 168 hours. BNP (last 3 results) No results for input(s): BNP in the last 8760 hours.  ProBNP (last 3 results) No results for input(s): PROBNP in the last 8760 hours.  CBG: Recent Labs  Lab 03/24/20 1609  GLUCAP 142*    No results found for this or any previous visit (from the past 240 hour(s)).   Studies: No results found.  Scheduled Meds: . Chlorhexidine Gluconate Cloth  6 each Topical Daily  . cyclobenzaprine  5 mg Per Tube TID  . diclofenac Sodium  4 g Topical QID  . enoxaparin (LOVENOX) injection  40 mg Subcutaneous Q24H  . famotidine  20 mg Per Tube BID  . feeding supplement  237 mL Oral TID BM  . feeding supplement (PROSource TF)  45 mL Per Tube TID  . free water  400 mL Per Tube Q6H  . Gerhardt's butt cream  1 application Topical TID  . hydrocortisone-pramoxine  1 applicator Rectal BID  . hydrOXYzine  10 mg Per Tube TID  . lacosamide  200 mg Per Tube BID  . multivitamin with minerals  1 tablet Per Tube Daily  . naltrexone  50 mg Per Tube Daily  . OLANZapine  5 mg Per Tube BID  . PHENObarbital  60 mg Per Tube BID  . phenytoin   200 mg Per Tube BID  . potassium chloride  40 mEq Per Tube Daily  . vitamin B-6  100 mg Per Tube Daily  . thiamine  100 mg Per Tube Daily   Continuous Infusions: . sodium chloride      Principal Problem:   Refractory seizure (HCC) Active Problems:   Acute metabolic encephalopathy   Hypokalemia   Polysubstance abuse (HCC)   Elevated CK   Transaminitis   Chronic hepatitis C without hepatic coma (HCC)   Distended abdomen   Palliative care encounter   Colonic Ileus (HCC)   Organic brain syndrome   On enteral nutrition   Physical deconditioning   Protein-calorie malnutrition (HCC)   Inadequate oral nutritional intake   Consultants:  Neurology  Psychiatry  Interventional radiology  Surgery  Procedures:  9/14 lumbar puncture  9/15 EEG  9/16 EEG  9/17 EEG  9/19 overnight EEG with video  9/22 overnight EEG with video with discontinuation of long-term EEG monitoring on 9/25  9/24 core track placement  10/6 EEG  Antibiotics: Anti-infectives (From admission, onward)   Start     Dose/Rate Route Frequency Ordered Stop   03/19/20 1000  metroNIDAZOLE (FLAGYL) tablet 500 mg  Status:  Discontinued        500 mg Per Tube 2 times daily 03/19/20 0822 03/23/20 0827   03/18/20 1200  amoxicillin (AMOXIL) 250 MG/5ML suspension 500 mg  Status:  Discontinued        500 mg Per Tube Every 8 hours 03/18/20 0915 03/23/20 0559   03/17/20 1200  cefTRIAXone (ROCEPHIN) 1 g in sodium chloride 0.9 % 100 mL IVPB  Status:  Discontinued        1 g 200 mL/hr over 30 Minutes Intravenous Every 24 hours 03/17/20 1048 03/18/20 0913   03/16/20 1130  metroNIDAZOLE (FLAGYL) 50 mg/ml oral suspension 500 mg  Status:  Discontinued        500 mg Per Tube 2 times daily 03/16/20 1040 03/19/20 0821   03/16/20 1130  fluconazole (DIFLUCAN) 40 MG/ML suspension 152 mg        150 mg Per Tube  Once 03/16/20 1040 03/16/20 1245   02/11/20 1526  ceFAZolin (ANCEF) IVPB 2g/100 mL premix        2 g 200 mL/hr  over 30 Minutes Intravenous  Once 02/11/20 1526 02/11/20 1641   02/05/20 0600  ceFAZolin (ANCEF) IVPB 2g/100 mL premix        2 g 200 mL/hr over 30 Minutes Intravenous To Short Stay 02/04/20 1054 02/05/20 0950   01/29/20 0600  ceFAZolin (ANCEF) IVPB 2g/100 mL premix        2 g 200 mL/hr over 30 Minutes Intravenous On call to O.R. 01/28/20  1511 01/30/20 0559   01/28/20 2000  cefTRIAXone (ROCEPHIN) 1 g in sodium chloride 0.9 % 100 mL IVPB        1 g 200 mL/hr over 30 Minutes Intravenous Every 24 hours 01/28/20 1925 02/02/20 0724   01/06/20 0900  cefTRIAXone (ROCEPHIN) 2 g in sodium chloride 0.9 % 100 mL IVPB  Status:  Discontinued        2 g 200 mL/hr over 30 Minutes Intravenous Every 12 hours 01/06/20 0105 01/06/20 2202   01/06/20 0800  vancomycin (VANCOCIN) IVPB 1000 mg/200 mL premix  Status:  Discontinued       "Followed by" Linked Group Details   1,000 mg 200 mL/hr over 60 Minutes Intravenous Every 12 hours 01/05/20 1904 01/06/20 2202   01/05/20 2000  vancomycin (VANCOREADY) IVPB 1500 mg/300 mL       "Followed by" Linked Group Details   1,500 mg 150 mL/hr over 120 Minutes Intravenous  Once 01/05/20 1904 01/06/20 0127   01/05/20 1830  cefTRIAXone (ROCEPHIN) 2 g in sodium chloride 0.9 % 100 mL IVPB        2 g 200 mL/hr over 30 Minutes Intravenous  Once 01/05/20 1829 01/05/20 2220       Time spent: 30 minutes    [name] ANP  [facility] Pager [phone].  03/30/2020, 1:40 PM  LOS: 84 days

## 2020-03-30 NOTE — Progress Notes (Signed)
Calorie Count Note  48 hour calorie count ordered.  Diet: regular Supplements: Ensure Enlive po TID, Magic Cup po TID  Day 1 Results Breakfast: no meal documentation available Lunch: 985 kcals, 35 grams of protein Dinner: 349 kcals, 17 grams of protein Supplements: no supplement documentation available  Total intake: 1334 kcal (74% of minimum estimated needs)  52 grams protein (57% of minimum estimated needs)  Day 2 Results Breakfast: 271 kcals, 2.5 grams of protein Lunch: 690 kcals, 25 grams of protein Dinner: no meal documentation available  Supplements: 406 kcals, 12.5 grams of protein  Total intake: 1367 kcal (76% of minimum estimated needs)  40 protein (44% of minimum estimated needs)   Meal documentation is incomplete and there was no documentation of Ensure Enlive/Plus consumption. Discussed pt with RN who reports pt has consumed 100% of Ensure supplements with her and previous RNs have reported that pt did well with supplements. If pt is consuming 100% of her Ensure Plus drinks, each supplement provides 350 kcals and 13 grams of protein. If pt is consuming 100% of all three Ensures per day, then she is meeting >100% of her kcal needs and 87-100% of her protein needs.    Nutrition Dx: Inadequate oral intake related to lethargy/confusion as evidenced by meal completion < 25% --  progressing  Goal: Patient will meet greater than or equal to 90% of their needs --  progressing  Intervention:  MVI with minerals daily Continue Ensure Enlive/Plus po BID, each supplement provides 350 kcal and 13 grams of protein (Ensure Plus has 13 grams protein; Ensure Enlive has 20 grams; Ensure Shiela Mayer has been unavailable)  Continue Magic cup TID with meals, each supplement provides 290 kcal and 9 grams of protein  May continue 14ml Prosource TF TID via PEG given pt's mentation is still variable and pt intermittently refusing po medications and not doing well with meals consistently. Each  39ml dose provides 40 kcals and 11 grams of protein. Will not transition pt to oral Prosource at this time due to concerns for pt refusing supplement   Eugene Gavia, MS, RD, LDN RD pager number and weekend/on-call pager number located in Amion.

## 2020-03-30 NOTE — Progress Notes (Signed)
Patient visibly upset, severe facial expressions, shaking. Patient using socks, gown, and hands to try to choke herself, using comb to hit herself in the head. MD informed, patient given Geodon.

## 2020-03-31 DIAGNOSIS — R451 Restlessness and agitation: Secondary | ICD-10-CM

## 2020-03-31 MED ORDER — HYDROXYZINE HCL 10 MG PO TABS
10.0000 mg | ORAL_TABLET | Freq: Three times a day (TID) | ORAL | Status: DC
  Administered 2020-03-31 – 2020-04-04 (×12): 10 mg
  Filled 2020-03-31 (×12): qty 1

## 2020-03-31 MED ORDER — HYDROXYZINE HCL 25 MG PO TABS
25.0000 mg | ORAL_TABLET | Freq: Three times a day (TID) | ORAL | Status: DC
  Administered 2020-03-31: 25 mg
  Filled 2020-03-31: qty 1

## 2020-03-31 MED ORDER — ACETAMINOPHEN 325 MG PO TABS
650.0000 mg | ORAL_TABLET | Freq: Four times a day (QID) | ORAL | Status: DC | PRN
  Administered 2020-04-01 – 2020-04-07 (×8): 650 mg via ORAL
  Filled 2020-03-31 (×9): qty 2

## 2020-03-31 MED ORDER — CYCLOBENZAPRINE HCL 10 MG PO TABS
5.0000 mg | ORAL_TABLET | Freq: Three times a day (TID) | ORAL | Status: DC
  Administered 2020-03-31 – 2020-04-04 (×15): 5 mg via ORAL
  Filled 2020-03-31 (×15): qty 1

## 2020-03-31 MED ORDER — ZOLPIDEM TARTRATE 5 MG PO TABS
5.0000 mg | ORAL_TABLET | Freq: Every evening | ORAL | Status: DC | PRN
  Administered 2020-04-01 – 2020-04-11 (×9): 5 mg via ORAL
  Filled 2020-03-31 (×9): qty 1

## 2020-03-31 MED ORDER — CARBAMAZEPINE 200 MG PO TABS
200.0000 mg | ORAL_TABLET | Freq: Two times a day (BID) | ORAL | Status: DC
  Administered 2020-03-31 – 2020-04-11 (×24): 200 mg via ORAL
  Filled 2020-03-31 (×26): qty 1

## 2020-03-31 MED ORDER — QUETIAPINE FUMARATE 200 MG PO TABS
200.0000 mg | ORAL_TABLET | Freq: Every day | ORAL | Status: DC
  Administered 2020-03-31 – 2020-04-03 (×4): 200 mg via ORAL
  Filled 2020-03-31 (×4): qty 1

## 2020-03-31 MED ORDER — QUETIAPINE FUMARATE 100 MG PO TABS
100.0000 mg | ORAL_TABLET | Freq: Every morning | ORAL | Status: DC
  Administered 2020-03-31 – 2020-04-03 (×4): 100 mg via ORAL
  Filled 2020-03-31 (×4): qty 1

## 2020-03-31 MED ORDER — THIAMINE HCL 100 MG PO TABS
100.0000 mg | ORAL_TABLET | Freq: Every day | ORAL | Status: DC
  Administered 2020-03-31 – 2020-07-02 (×91): 100 mg via ORAL
  Filled 2020-03-31 (×93): qty 1

## 2020-03-31 MED ORDER — LACOSAMIDE 200 MG PO TABS
200.0000 mg | ORAL_TABLET | Freq: Two times a day (BID) | ORAL | Status: DC
  Administered 2020-03-31 – 2020-04-06 (×13): 200 mg via ORAL
  Filled 2020-03-31 (×13): qty 1

## 2020-03-31 MED ORDER — VITAMIN B-6 100 MG PO TABS
100.0000 mg | ORAL_TABLET | Freq: Every day | ORAL | Status: DC
  Administered 2020-03-31 – 2020-07-02 (×91): 100 mg via ORAL
  Filled 2020-03-31 (×96): qty 1

## 2020-03-31 MED ORDER — ADULT MULTIVITAMIN W/MINERALS CH
1.0000 | ORAL_TABLET | Freq: Every day | ORAL | Status: DC
  Administered 2020-03-31 – 2020-07-02 (×90): 1 via ORAL
  Filled 2020-03-31 (×93): qty 1

## 2020-03-31 MED ORDER — NALTREXONE HCL 50 MG PO TABS
50.0000 mg | ORAL_TABLET | Freq: Every day | ORAL | Status: DC
  Administered 2020-03-31 – 2020-05-02 (×33): 50 mg via ORAL
  Filled 2020-03-31 (×34): qty 1

## 2020-03-31 MED ORDER — OLANZAPINE 2.5 MG PO TABS: 5.0000 mg | ORAL_TABLET | Freq: Two times a day (BID) | ORAL | Status: DC

## 2020-03-31 NOTE — Progress Notes (Addendum)
[facility] PROGRESS NOTE  Anita Davis PXT:[id number] DOB: [birth year] DOA: 01/04/2020 PCP: [name], PA-C            12/8 Past few days Anita Davis's alertness and mentation has improved.  For the most part she is nonverbal especially when stressed but is able to express needs and desires and short bursts and phrases she has significant impulsivity and agitation and anxiety are decreased most of the time when ambulated in halls.    Status: Remains inpatient appropriate because:Altered mental status and Unsafe d/c plan  Dispo:  The patient is from: Home              Anticipated d/c is to: SNF vs home with family              Anticipated d/c date is: > 3 days              Patient currently is medically stable to d/c.  Barrier to discharge is need for SNF placement, Medicaid and disability applications in process   Code Status: Full Family Communication: Stepmother [name] on 12/08 at bedside.  She has been updated on med changes and significant improvements witnessed in patient's behavior and appetite since we last spoke.  She will try to obtain tablet for patient used to play games and communicate at bedside.  She is agreeable to considering discharge home pending continued improvement in patient's behavior. DVT prophylaxis: Lovenox Vaccination status: Family confirmed initial Pfizer Covid vaccine dose given in June, second dose given on 11/2  Foley catheter: No  HPI: 34 year old female with known history of substance abuse on prior methadone, active hepatitis C and gestational diabetes who presented initially with confusion.  She was initially admitted to [facility] in September with a diagnosis of encephalopathy presumed secondary to drug overdose.  Subsequent work-up inconclusive.  LP without signs of infection, MRI brain unremarkable and unfortunately she remained encephalopathic.  EEG did reveal focal seizures.  AED therapy was initiated and seizures were  suppressed and she was transferred to [facility] for continuous EEG.  Apparently had episodes of status epilelepticus before full suppression of seizures with medications.  Subsequently neurological recovery was poor and delayed.  Neurology suspicious for autoimmune encephalitis and was given high-dose steroids with equivocal improvement but deteriorated after steroids completed.  Was given IVIG without any significant improvement in mentation.  Since that time patient has had waxing and waning mentation ranging from unresponsive to alert confused and verbal and at times restless and agitated.  When she is awake/alert she is able to ambulate but requires assistance due to impulsivity and gait instability.  Unfortunately these episodes of wakefulness are never sustained.  It is suspected that patient has a chronic sequela from her encephalopathy and seizures therefore long-term SNF has been recommended.  He is inconsistent with oral intake and continues to require tube feedings for nutrition and hydration.  Subjective: Alert.  Relating in room with 1:1 Sitter assisting.  Slightly more agitated today.  When offered to walk in hallway patient stated yes.  Later requested a cup of coffee and was agreeable to sit in recliner at nursing station.  Patient began having inappropriate pelvic movements consistent with hypersexual sedation.  Initially she responded to verbal cue to stop doing this including warning that if behavior continued she would have to go back to her room.  Hopped briefly but patient continued pelvic thrusting behavior in chair so was taken back to her room.  RTR 1440-stepmom [name] at  bedside-playing music on phone- [name] is singing along with the music Music therapist([place]). [name] is also having discomfort from her hemorrhoids  Objective: Vitals:   03/31/20 0746 03/31/20 1209  BP: (!) 125/98 124/82  Pulse: 87 (!) 105  Resp: 18 18  Temp: 97.7 F (36.5 C) 97.9 F (36.6 C)  SpO2: 100% 98%     Intake/Output Summary (Last 24 hours) at 03/31/2020 1301 Last data filed at 03/31/2020 0946 Gross per 24 hour  Intake 1080 ml  Output 0 ml  Net 1080 ml   Filed Weights   03/24/20 0432 03/26/20 0333 03/29/20 0500  Weight: 74.3 kg 75.3 kg 72.5 kg    Exam:  Constitutional: NAD, much more restless and impulsive day Respiratory: clear to auscultation bilaterally. Normal respiratory effort. No accessory muscle use.  RA Cardiovascular: Regular rate and rhythm, no murmurs / rubs / gallops. No extremity edema.  Skin warm and dry Abdomen: no tenderness, no masses palpated.  Bowel sounds positive.  PEG tube in place.  External hemorrhoids improving with medication Musculoskeletal: no clubbing / cyanosis. No joint deformity upper and lower extremities. Good ROM,  Normal muscle tone.  Neurologic: CN 2-12 grossly intact. Sensation intact, Strength 5/5 x all 4 extremities.  Improvement in gait.   Psychiatric: Alert and oriented times name and possibly to place.  Less verbally responsive today.  Sitter pointed out multiple writings by patient where she has been in detail her thoughts and feelings (better able to convey these by written word as opposed to speaking).  At times the speech patterns appear pressured and consistent with flight of ideas.  Has had issues of more emotional lability and hypersexual behavior since transitioned to Zyprexa.   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  Maximized depression therapy with Zoloft and the addition of Abilify only contributed to ongoing lethargy therefore tapered and discontinued.  Noted with dystonic  movements with Abilify was discontinued -Behaviors worsened with utilization of benzodiazepines I have discontinued in favor of IM Vistaril initially followed by IM Haldol if agitation persists.  Today 12/8 ordered Geodon 20 mg IM x1 for evolving agitation, emotional lability and hypersexual behavior -Initiated naltrexone this admission given behaviors consistent with hyper sexualization (patient with history of PTSD and narcotic abuse therefore prior to admission ie chronic overstimulation of amygdala/hippocampus). -Patient has excellent response to Geodon prn -Unfortunately patient had worsening of apathy and lethargy on SSRI so we will not utilize this medication -12/6 psychiatry started and was increased on 12/8.  Unfortunately behaviors have worsened as described above therefore will resume Seroquel but at slightly higher dosage: In a.m. and 200 mg at bedtime. -5/9 psych also recommended initiating Tegretol 200 mg twice daily behavioral disturbances, agitation and impulsivity. -Continue low-dose scheduled Vistaril to help with impulsivity as well -Continue one-to-one safety sitter because of impulsivity increased fall risk include wandering behaviors D/w Pam/CIR- rec [facility] if able to be dc'd home  Hyperglycemia secondary to medication -Patient is having intermittent hyperglycemia with sugars in the high 250s at time on serum glucose -Repeated hemoglobin A1c -5.8 which is consistent with prediabetes -Antipsychotics such as Seroquel and Zyprexa can cause hyperglycemia and drug-related diabetes mellitus we will continue to  follow closely; patient has been having increased thirst and increased urination  Physical deconditioning -More consistent participation with PT and OT. -Continues to require SNF level of care due to safety issues and impulsivity but are hopeful can manage behavior enough that patient can return home with family  Acute on chronic depression  prior to admission/known polysubstance abuse/suicidal ideation -Attempted to maximize depression therapy but seem to make patient's mentation worse and did not improve symptoms therefore Zoloft 100 mg and Abilify 2 mg discontinued after being tapered -Patient with behaviors as well as written statements concerning for worsening depression as well as suicidal ideation although suspected this is primarily related to her brain injury -Psychiatric team updated and agrees with current medication regimen and safety plan in place with one-to-one sitter, removal of any sharp objects from room and utilization of disposable meal tray  Large hemorrhoids. -has been on Proctofoam since 12/1 -will add sitz bath -if remain enlarged will ask surgery to evaluate given increased discomfort increase anxiety and agitation  Dysphagia -Continue regular diet -Given behavioral issues we will leave PEG tube in place for medications until patient consistently takes medications by mouth -12/9 have transitioned all meds to by mouth.  If tolerates can likely consider continuing PEG tube but would need to demonstrate consistent acceptance of medications by mouth for at least 2 weeks  Nutrition Status: Nutrition Problem: Inadequate oral intake Etiology: lethargy/confusion Signs/Symptoms: meal completion < 25% Interventions: Tube feeding,Prostat-12/6 tube feeding discontinued in favor of regular diet, prostat supplement and Ensure supplements      Other problems: Nonobstructive transaminitis in context of hepatitis C -Elevated HCV antibody with markedly elevated HCV RNA quantitative level  consistent with chronic hepatitis C  -None total bilirubin normal with slightly elevated alkaline phosphatase with AST 165 and ALT 274 -11/10: Discussed with ID Dr. Manson Passey.  Treatment of hepatitis C typically is initiated in the outpatient setting.  Medications can be quite expensive and usually take time to obtain insurance  approval.  Also since patient likely go to skilled nursing facility expensive medication could be barrier to admission. -ID recommends scheduling outpatient appointment their clinic closer to discharge date  ? Breast enlargement/gynecomastia -No clinical evidence of breast enlargement -Prolactin level low with normal FSH -Appearance of breast enlargement reported by family likely related to improve nutrition as well as abdominal binder moving up underneath breasts and malpositioning of breasts.  Diarrhea>>>Constipation Resolved Felt secondary to laxatives but given Salmonella UTI could have been related to Salmonella although at the time diarrhea was being worked up patient had no leukocytosis, no abdominal pain and no fevers.  Suspected oral Candida -Resolved after oral nystatin  Low back pain -Resolved -Completed 5 days of ibuprofen -Continue Voltaren gel and add as needed Flexeril (will DC Robaxin since there is to be ineffective)  Salmonella UTI -Completed amoxicillin    Data Reviewed: Basic Metabolic Panel: Recent Labs  Lab 03/25/20 1248  NA 135  K 4.6  CL 100  CO2 24  GLUCOSE 289*  BUN 12  CREATININE 0.65  CALCIUM 8.7*   Liver Function Tests: Recent Labs  Lab 03/25/20 1248  AST 114*  ALT 158*  ALKPHOS 150*  BILITOT 0.4  PROT 6.4*  ALBUMIN 3.6   No results for input(s): LIPASE, AMYLASE in the last 168 hours. No results for input(s): AMMONIA in the last 168 hours. CBC: Recent Labs  Lab 03/25/20 1248  WBC 5.8  NEUTROABS 3.2  HGB 13.3  HCT 38.0  MCV 93.6  PLT  343   Cardiac Enzymes: No results for input(s): CKTOTAL, CKMB, CKMBINDEX, TROPONINI in the last 168 hours. BNP (last 3 results) No results for input(s): BNP in the last 8760 hours.  ProBNP (last 3 results) No results for input(s): PROBNP in the last 8760 hours.  CBG: Recent Labs  Lab 03/24/20 1609  GLUCAP 142*    No results found for this or any previous visit (from the past 240  hour(s)).   Studies: No results found.  Scheduled Meds: . carbamazepine  200 mg Oral BID  . Chlorhexidine Gluconate Cloth  6 each Topical Daily  . cyclobenzaprine  5 mg Oral TID  . diclofenac Sodium  4 g Topical QID  . enoxaparin (LOVENOX) injection  40 mg Subcutaneous Q24H  . feeding supplement  237 mL Oral TID BM  . feeding supplement (PROSource TF)  45 mL Per Tube TID  . free water  400 mL Per Tube Q6H  . Gerhardt's butt cream  1 application Topical TID  . hydrocortisone-pramoxine  1 applicator Rectal BID  . hydrOXYzine  10 mg Per Tube TID  . lacosamide  200 mg Oral BID  . multivitamin with minerals  1 tablet Oral Daily  . naltrexone  50 mg Oral Daily  . vitamin B-6  100 mg Oral Daily  . QUEtiapine  100 mg Oral q morning - 10a  . QUEtiapine  200 mg Oral QHS  . thiamine  100 mg Oral Daily   Continuous Infusions: . sodium chloride      Principal Problem:   Refractory seizure (HCC) Active Problems:   Acute metabolic encephalopathy   Hypokalemia   Polysubstance abuse (HCC)   Elevated CK   Transaminitis   Chronic hepatitis C without hepatic coma (HCC)   Distended abdomen   Palliative care encounter   Colonic Ileus (HCC)   Organic brain syndrome   On enteral nutrition   Physical deconditioning   Protein-calorie malnutrition (HCC)   Inadequate oral nutritional intake   Consultants:  Neurology  Psychiatry  Interventional radiology  Surgery  Procedures:  9/14 lumbar puncture  9/15 EEG  9/16 EEG  9/17 EEG  9/19 overnight EEG with video  9/22 overnight EEG with video with discontinuation of long-term EEG monitoring on 9/25  9/24 core track placement  10/6 EEG  Antibiotics: Anti-infectives (From admission, onward)   Start     Dose/Rate Route Frequency Ordered Stop   03/19/20 1000  metroNIDAZOLE (FLAGYL) tablet 500 mg  Status:  Discontinued        500 mg Per Tube 2 times daily 03/19/20 0822 03/23/20 0827   03/18/20 1200  amoxicillin (AMOXIL) 250  MG/5ML suspension 500 mg  Status:  Discontinued        500 mg Per Tube Every 8 hours 03/18/20 0915 03/23/20 0559   03/17/20 1200  cefTRIAXone (ROCEPHIN) 1 g in sodium chloride 0.9 % 100 mL IVPB  Status:  Discontinued        1 g 200 mL/hr over 30 Minutes Intravenous Every 24 hours 03/17/20 1048 03/18/20 0913   03/16/20 1130  metroNIDAZOLE (FLAGYL) 50 mg/ml oral suspension 500 mg  Status:  Discontinued        500 mg Per Tube 2 times daily 03/16/20 1040 03/19/20 0821   03/16/20 1130  fluconazole (DIFLUCAN) 40 MG/ML suspension 152 mg        150 mg Per Tube  Once 03/16/20 1040 03/16/20 1245   02/11/20 1526  ceFAZolin (ANCEF) IVPB 2g/100 mL premix  2 g 200 mL/hr over 30 Minutes Intravenous  Once 02/11/20 1526 02/11/20 1641   02/05/20 0600  ceFAZolin (ANCEF) IVPB 2g/100 mL premix        2 g 200 mL/hr over 30 Minutes Intravenous To Short Stay 02/04/20 1054 02/05/20 0950   01/29/20 0600  ceFAZolin (ANCEF) IVPB 2g/100 mL premix        2 g 200 mL/hr over 30 Minutes Intravenous On call to O.R. 01/28/20 1511 01/30/20 0559   01/28/20 2000  cefTRIAXone (ROCEPHIN) 1 g in sodium chloride 0.9 % 100 mL IVPB        1 g 200 mL/hr over 30 Minutes Intravenous Every 24 hours 01/28/20 1925 02/02/20 0724   01/06/20 0900  cefTRIAXone (ROCEPHIN) 2 g in sodium chloride 0.9 % 100 mL IVPB  Status:  Discontinued        2 g 200 mL/hr over 30 Minutes Intravenous Every 12 hours 01/06/20 0105 01/06/20 2202   01/06/20 0800  vancomycin (VANCOCIN) IVPB 1000 mg/200 mL premix  Status:  Discontinued       "Followed by" Linked Group Details   1,000 mg 200 mL/hr over 60 Minutes Intravenous Every 12 hours 01/05/20 1904 01/06/20 2202   01/05/20 2000  vancomycin (VANCOREADY) IVPB 1500 mg/300 mL       "Followed by" Linked Group Details   1,500 mg 150 mL/hr over 120 Minutes Intravenous  Once 01/05/20 1904 01/06/20 0127   01/05/20 1830  cefTRIAXone (ROCEPHIN) 2 g in sodium chloride 0.9 % 100 mL IVPB        2 g 200 mL/hr over  30 Minutes Intravenous  Once 01/05/20 1829 01/05/20 2220       Time spent: 30 minutes    [name] ANP  [facility] Pager [phone].  03/31/2020, 1:01 PM  LOS: 85 days

## 2020-03-31 NOTE — Progress Notes (Signed)
Physical Therapy Treatment [name] MRN: [id number] DOB: 03/09/1986 Today's Date: 03/31/2020    History of Present Illness 34 year old with past medical history significant for hepatitis C, polysubstance abuse brought to the hospital 9/13 for disorientation which was suspected to be substance abuse.  After she became more lucid, she was discharged, but then police brought her back later in the day and they found her passed out in a parking lot.  Urine drug screen was positive for benzodiazepine and THC. An EEG done on 9/15 showed patient was in status epilepticus.  Patient was a started on Keppra.  Despite these, EEG noted continued seizures requiring additional medications as well.  Finally on 9/18, seizures broke. Follow-up EEG on 9/20 noted evidence of epilepticity from the left central temporal region.  Repeat EEG done on 9/22 noted epileptogenicity from the left central temporal region.  Pt with PEG placed on 02/05/20.     PT Comments    Pt tolerates treatment well, ambulating in the halls with PT assistance for safety. Pt continues to have difficulty following multi-step commands, limiting her ability to participate in dual-task activities. Pt is unable to find room in hallway despite multiple PT cues to assist. Pt will continue to benefit from PT POC to improve dynamic gait and balance and reduce caregiver burden. Due to pt cognitive deficits she continues to require 24/7 supervision. PT continues to recommend SNF placement unless family is able to provide 24/7 assistance.   Follow Up Recommendations  SNF;Supervision/Assistance - 24 hour     Equipment Recommendations  None recommended by PT    Recommendations for Other Services       Precautions / Restrictions Precautions Precautions: Fall Precaution Comments: Seizure, PEG, abdominal binder Restrictions Weight Bearing Restrictions: No    Mobility  Bed Mobility Overal bed mobility: Needs Assistance Bed  Mobility: Supine to Sit;Sit to Supine     Supine to sit: Supervision Sit to supine: Supervision      Transfers Overall transfer level: Needs assistance Equipment used: None Transfers: Sit to/from Stand Sit to Stand: Supervision            Ambulation/Gait Ambulation/Gait assistance: Min guard Gait Distance (Feet): 250 Feet Assistive device: None Gait Pattern/deviations: Step-to pattern Gait velocity: reduced Gait velocity interpretation: <1.8 ft/sec, indicate of risk for recurrent falls General Gait Details: pt with short step-to gait, slowed gait speed, increased lateral sway   Stairs             Wheelchair Mobility    Modified Rankin (Stroke Patients Only)       Balance Overall balance assessment: Needs assistance Sitting-balance support: No upper extremity supported;Feet supported Sitting balance-Leahy Scale: Good     Standing balance support: No upper extremity supported;During functional activity Standing balance-Leahy Scale: Fair                              Cognition Arousal/Alertness: Awake/alert Behavior During Therapy: Flat affect Overall Cognitive Status: Impaired/Different from baseline Area of Impairment: Orientation;Attention;Memory;Following commands;Safety/judgement;Awareness;Problem solving                 Orientation Level: Disoriented to;Time Current Attention Level: Sustained Memory: Decreased recall of precautions;Decreased short-term memory Following Commands: Follows one step commands consistently;Follows multi-step commands inconsistently Safety/Judgement: Decreased awareness of safety;Decreased awareness of deficits Awareness: Intellectual Problem Solving: Slow processing;Difficulty sequencing        Exercises      General Comments General comments (  skin integrity, edema, etc.): VSS on RA      Pertinent Vitals/Pain Pain Assessment: Faces Faces Pain Scale: No hurt    Home Living                       Prior Function            PT Goals (current goals can now be found in the care plan section) Acute Rehab PT Goals Patient Stated Goal: none stated Progress towards PT goals: Progressing toward goals    Frequency    Min 2X/week      PT Plan Current plan remains appropriate    Co-evaluation              AM-PAC PT "6 Clicks" Mobility   Outcome Measure  Help needed turning from your back to your side while in a flat bed without using bedrails?: A Little Help needed moving from lying on your back to sitting on the side of a flat bed without using bedrails?: A Little Help needed moving to and from a bed to a chair (including a wheelchair)?: A Little Help needed standing up from a chair using your arms (e.g., wheelchair or bedside chair)?: A Little Help needed to walk in hospital room?: A Little Help needed climbing 3-5 steps with a railing? : A Lot 6 Click Score: 17    End of Session Equipment Utilized During Treatment: Gait belt Activity Tolerance: Patient tolerated treatment well Patient left: in bed;with call bell/phone within reach;with nursing/sitter in room Nurse Communication: Mobility status PT Visit Diagnosis: Unsteadiness on feet (R26.81);Muscle weakness (generalized) (M62.81);Difficulty in walking, not elsewhere classified (R26.2);Other symptoms and signs involving the nervous system (R29.898);Other abnormalities of gait and mobility (R26.89)     Time: 4332-9518 PT Time Calculation (min) (ACUTE ONLY): 10 min  Charges:  $Gait Training: 8-22 mins                     Arlyss Gandy, PT, DPT Acute Rehabilitation Pager: [phone]    Arlyss Gandy 03/31/2020, 5:02 PM

## 2020-03-31 NOTE — Progress Notes (Signed)
Patient ID: Anita Davis, female   DOB: 1986-01-24, 34 y.o.   MRN: [id number]  This NP visited patient at the bedside as a follow up for palliative medicine need.  34 year old female with known history of substance abuse on prior methadone, active hepatitis C and gestational diabetes who presented initially with confusion.  She was initially admitted to [facility] in September with a diagnosis of encephalopathy presumed secondary to drug overdose.  Subsequent work-up inconclusive.  LP without signs of infection, MRI brain unremarkable and unfortunately she remained encephalopathic.  EEG did reveal focal seizures.  AED therapy was initiated and seizures were suppressed and she was transferred to [facility] for continuous EEG.  Apparently had episodes of status epilelepticus before full suppression of seizures with medications.  Subsequently neurological recovery was poor and delayed.  Neurology suspicious for autoimmune encephalitis and was given high-dose steroids with equivocal improvement but deteriorated after steroids completed.  Was given IVIG without any significant improvement in mentation.  Since that time patient has had waxing and waning mentation ranging from unresponsive to alert confused and verbal and at times restless and agitated.  When she is awake/alert she is able to ambulate but requires assistance due to impulsivity and gait instability.  Unfortunately these episodes of wakefulness are never sustained.  It is suspected that patient has a chronic sequela from her encephalopathy therefore long-term SNF has been recommended.    For the most part she is nonverbal especially when stressed but is able to express needs and desires and short bursts and phrases she has significant impulsivity and agitation and anxiety are intermittently decreased when ambulating in the halls or eating meals she enjoys.  Today patient is alert, able to ambulate independently, patient requires one-on-one  support secondary to sporadic behavior, impulsivity and safety issues.  She is tolerating her oral intake well and is maintaining nutrition and hydration without artificial support.  Spoke with father/  Anita Davis offering support.  This is a very difficult situation for the family as a whole.  The patient's father is her next of kin and main decision maker for at this point in time.  Father is trying to secure guardianship.  Untimately the goal is for Anita Davis to be stable for disposition home.    Education offered today to Anita. Anita Davis regarding the importance of documentation of advanced care planning.  Education offered regarding elements to advanced care planning specific to CODE STATUS, artificial feeding and hydration, antibiotic use, and rehospitalization's.  The difference between an aggressive medical intervention path and a palliative comfort path for this patient at this time in this situation was detailed.          Introduced MOST form  `Anita Davis tells me that currently he is seeking guardianship for his daughter.  At this time he declines documentation of any advance care planning.  He is open to all offered and available medical interventions to prolong life.  Discussed with father the importance of continued conversation with his family and their  medical providers regarding overall plan of care and treatment options,  ensuring decisions are within the context of the patients values and GOCs.  Questions and concerns addressed   Discussed with  Anita Davis and PT and bedside RN  Total time spent on the unit was  Minutes 35 minutes   PMT will continue to support holistically  Greater than 50% of the time was spent in counseling and coordination of care  Anita Creed NP  Palliative Medicine  Team Team Phone # [phone] Pager [phone]

## 2020-04-01 DIAGNOSIS — R5381 Other malaise: Secondary | ICD-10-CM

## 2020-04-01 DIAGNOSIS — F639 Impulse disorder, unspecified: Secondary | ICD-10-CM

## 2020-04-01 LAB — CBC
HCT: 38.7 % (ref 36.0–46.0)
Hemoglobin: 12.7 g/dL (ref 12.0–15.0)
MCH: 31.7 pg (ref 26.0–34.0)
MCHC: 32.8 g/dL (ref 30.0–36.0)
MCV: 96.5 fL (ref 80.0–100.0)
Platelets: 348 10*3/uL (ref 150–400)
RBC: 4.01 MIL/uL (ref 3.87–5.11)
RDW: 14.3 % (ref 11.5–15.5)
WBC: 5.4 10*3/uL (ref 4.0–10.5)
nRBC: 0 % (ref 0.0–0.2)

## 2020-04-01 LAB — COMPREHENSIVE METABOLIC PANEL
ALT: 127 U/L — ABNORMAL HIGH (ref 0–44)
AST: 97 U/L — ABNORMAL HIGH (ref 15–41)
Albumin: 3.3 g/dL — ABNORMAL LOW (ref 3.5–5.0)
Alkaline Phosphatase: 181 U/L — ABNORMAL HIGH (ref 38–126)
Anion gap: 10 (ref 5–15)
BUN: 11 mg/dL (ref 6–20)
CO2: 27 mmol/L (ref 22–32)
Calcium: 9.1 mg/dL (ref 8.9–10.3)
Chloride: 100 mmol/L (ref 98–111)
Creatinine, Ser: 0.51 mg/dL (ref 0.44–1.00)
GFR, Estimated: >60 mL/min (ref >60)
Glucose, Bld: 129 mg/dL — ABNORMAL HIGH (ref 70–99)
Potassium: 4 mmol/L (ref 3.5–5.1)
Sodium: 137 mmol/L (ref 135–145)
Total Bilirubin: 0.5 mg/dL (ref 0.3–1.2)
Total Protein: 6.2 g/dL — ABNORMAL LOW (ref 6.5–8.1)

## 2020-04-01 LAB — HEPATITIS PANEL, ACUTE
HCV Ab: REACTIVE — AB
Hep A IgM: NONREACTIVE
Hep B C IgM: NONREACTIVE
Hepatitis B Surface Ag: NONREACTIVE

## 2020-04-01 LAB — PHENOBARBITAL LEVEL: Phenobarbital: 17.1 ug/mL (ref 15.0–30.0)

## 2020-04-01 LAB — PHENYTOIN LEVEL, TOTAL: Phenytoin Lvl: 3 ug/mL — ABNORMAL LOW (ref 10.0–20.0)

## 2020-04-01 MED ORDER — IBUPROFEN 200 MG PO TABS
400.0000 mg | ORAL_TABLET | Freq: Four times a day (QID) | ORAL | Status: DC | PRN
  Administered 2020-04-01 (×2): 400 mg via ORAL
  Filled 2020-04-01 (×2): qty 2

## 2020-04-01 MED ORDER — PHENOBARBITAL 32.4 MG PO TABS
64.8000 mg | ORAL_TABLET | Freq: Two times a day (BID) | ORAL | Status: DC
  Administered 2020-04-01 – 2020-04-17 (×34): 64.8 mg via ORAL
  Filled 2020-04-01 (×34): qty 2

## 2020-04-01 MED ORDER — PHENYTOIN 50 MG PO CHEW
200.0000 mg | CHEWABLE_TABLET | Freq: Two times a day (BID) | ORAL | Status: DC
  Administered 2020-04-01 – 2020-04-11 (×22): 200 mg via ORAL
  Filled 2020-04-01 (×23): qty 4

## 2020-04-01 NOTE — TOC Progression Note (Signed)
Transition of Care [facility]) - Progression Note    Patient Details  Name: Anita Davis MRN: [id number] Date of Birth: [birth year]  Transition of Care [facility]) CM/SW Contact  Carley Hammed, [place] Phone Number: 04/01/2020, 2:43 PM  Clinical Narrative:    CSW received update that [name] may be able to take pt. CSW faxed referral [phone]) and left a voicemail for Stephanie [phone]). SW will continue to follow.   Expected Discharge Plan: Skilled Nursing Facility Barriers to Discharge: Continued Medical Work up,Inadequate or no insurance,Active Substance Use - Placement,SNF Pending payor source - LOG,SNF Pending Medicaid,SNF Pending bed offer  Expected Discharge Plan and Services Expected Discharge Plan: Skilled Nursing Facility In-house Referral: Artist   Post Acute Care Choice: Skilled Nursing Facility Living arrangements for the past 2 months: Mobile Home                                       Social Determinants of Health (SDOH) Interventions    Readmission Risk Interventions No flowsheet data found.

## 2020-04-01 NOTE — Progress Notes (Addendum)
[facility] PROGRESS NOTE  Anita Davis IFO:[id number] DOB: 07-Sep-1985 DOA: 01/04/2020 PCP: [name], PA-C            12/8 Past few days Anita Davis's alertness and mentation has improved.  For the most part she is nonverbal especially when stressed but is able to express needs and desires and short bursts and phrases she has significant impulsivity and agitation and anxiety are decreased most of the time when ambulated in halls.    Status: Remains inpatient appropriate because:Altered mental status and Unsafe d/c plan  Dispo:  The patient is from: Home              Anticipated d/c is to: SNF vs home with family.  Extensive conversation had with patient's stepmother Anita Davis.  She has been in contact with a local nursing facility in [facility] that apparently accept patients with neuro psychiatric issues.  It is best for patient if we are able to locate a facility that is close to family so they can visit her daily              Anticipated d/c date is: > 3 days              Patient currently is medically stable to d/c.  Barrier to discharge is need for SNF placement most likely at a facility that can accommodate behavioral issues similar to dementia patients, Medicaid and disability applications in process.  TOC given contact information for above-stated nursing facility in [facility]   Code Status: Full Family Communication: Stepmother Anita Davis on 12/08 at bedside.  She has been updated on med changes and significant improvements witnessed in patient's behavior and appetite since we last spoke.  She will try to obtain tablet for patient used to play games and communicate at bedside.  She is agreeable to considering discharge home pending continued improvement in patient's behavior. DVT prophylaxis: Lovenox Vaccination status: Family confirmed initial Pfizer Covid vaccine dose given in June, second dose given on 11/2  Foley catheter: No  HPI: 34 year old female with known  history of substance abuse on prior methadone, active hepatitis C and gestational diabetes who presented initially with confusion.  She was initially admitted to [facility] in September with a diagnosis of encephalopathy presumed secondary to drug overdose.  Subsequent work-up inconclusive.  LP without signs of infection, MRI brain unremarkable and unfortunately she remained encephalopathic.  EEG did reveal focal seizures.  AED therapy was initiated and seizures were suppressed and she was transferred to [facility] for continuous EEG.  Apparently had episodes of status epilelepticus before full suppression of seizures with medications.  Subsequently neurological recovery was poor and delayed.  Neurology suspicious for autoimmune encephalitis and was given high-dose steroids with equivocal improvement but deteriorated after steroids completed.  Was given IVIG without any significant improvement in mentation.  Since that time patient has had waxing and waning mentation ranging from unresponsive to alert confused and verbal and at times restless and agitated.  When she is awake/alert she is able to ambulate but requires assistance due to impulsivity and gait instability.  Unfortunately these episodes of wakefulness are never sustained.  It is suspected that patient has a chronic sequela from her encephalopathy and seizures therefore long-term SNF has been recommended.  He is inconsistent with oral intake and continues to require tube feedings for nutrition and hydration.  Subjective: Alert.  Standing at sink performing a.m. care.  Sitter at bedside and has assisted with care that patient unable  to perform independently.  Was calm upon my initial entry to room.  Was singing to songs playing on tablet.  She became aware that I would not be staying due to need to round she became extremely distraught.  She began crying and stating "please do not leave me".  She also began shaking her head rapidly and writhing on  the bed.  Redirected to listen to song and seeing and that calmed her briefly.  When asked if she wanted to put on new pajamas behavior stopped and she stated "yes".  After pajamas placed and vital signs taken patient requested to walk around unit and remained calm afterwards.  Nurse updated on behavior.  Patient returned to room and placed in recliner chair she then stated to sitter "please do not leave me"  Objective: Vitals:   04/01/20 0837 04/01/20 1222  BP: 122/86 124/84  Pulse: 93 95  Resp: 20 18  Temp: (!) 97.5 F (36.4 C) 97.9 F (36.6 C)  SpO2: 99% 98%    Intake/Output Summary (Last 24 hours) at 04/01/2020 1225 Last data filed at 04/01/2020 1000 Gross per 24 hour  Intake 1440 ml  Output 4 ml  Net 1436 ml   Filed Weights   03/24/20 0432 03/26/20 0333 03/29/20 0500  Weight: 74.3 kg 75.3 kg 72.5 kg    Exam:  Constitutional: NAD, mostly calm but does have impulsive and self agitated behavior when becomes afraid Respiratory: clear to auscultation bilaterally. Normal respiratory effort. No accessory muscle use.  RA Cardiovascular: Regular rate and rhythm, No extremity edema.  Skin warm and dry Abdomen: no tenderness, no masses palpated.  Bowel sounds positive.  PEG tube in place.  External hemorrhoids improving with medication Musculoskeletal: no clubbing / cyanosis. No joint deformity upper and lower extremities. Good ROM,  Normal muscle tone.  Neurologic: CN 2-12 grossly intact. Sensation intact, Strength 5/5 x all 4 extremities.  Improvement in gait.   Psychiatric: Alert and oriented times name and place.  Continues to have extreme emotional lability and mood swings precipitated by fear of being left alone.   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  Maximized depression therapy with Zoloft and the addition of Abilify only contributed to ongoing lethargy therefore tapered and discontinued.  Noted with dystonic movements with Abilify was discontinued -Behaviors worsened with utilization of benzodiazepines I have discontinued in favor of IM Vistaril initially followed by IM Haldol if agitation persists.  Today 12/8 ordered Geodon 20 mg IM x1 for evolving agitation, emotional lability and hypersexual behavior -Initiated naltrexone this admission given behaviors consistent with hyper sexualization (patient with history of PTSD and narcotic abuse therefore prior to admission ie chronic overstimulation of amygdala/hippocampus). -Patient has excellent response to Geodon prn due to typical uses for this drug long-term as well as cost this would not be an outpatient option for her orally -Unfortunately patient had worsening of apathy and lethargy on SSRI so we will not utilize this medication -12/6 psychiatry started Zyprexa but patient symptoms worsened therefore was discontinued.  Seroquel resumed on 12/9 but at slightly higher dosage: In a.m. and 200 mg at bedtime. -12/9 started Tegretol 200 mg twice daily behavioral disturbances, agitation and impulsivity recommended by psychiatry. -Continue low-dose scheduled  Vistaril to help with impulsivity as well -Continue one-to-one safety sitter because of impulsivity increased fall risk include wandering behaviors D/w Pam/CIR- rec [facility] if able to be dc'd home  **At this juncture due to waxing and waning mentation and disorientation, ongoing impulsivity safety concerns and recent behaviors exhibited by patient concerning for suicidal ideation it is felt that patient does not have capacity to make medical decisions.  PT and OT have evaluated patient and  due to safety concerns she clearly is not able to independently manage IADLs and current recommendation is for SNF.  Because of concerns over her mental health she is requiring a 1: 1 sitter while medications are being adjusted.  We will ask psych for additional input.  Patient's father needs to obtain legal guardianship of patient to manage her affairs when she is discharged from the hospital.  Given her current medical, mental and emotional state she appears to lack competency as well.  Final decision will need to be made by a judge.  Patient's father will need to pursue this through the court system.  He currently has a Clinical research associatelawyer who is assisting with guardianship process and should be able to assist with clarifying patient's competency  Hyperglycemia secondary to medication -Patient is having intermittent hyperglycemia with sugars in the high 250s at time on serum glucose -Repeated hemoglobin A1c -5.8 which is consistent with prediabetes -Antipsychotics such as Seroquel and Zyprexa can cause hyperglycemia and drug-related diabetes mellitus we will continue to follow closely; patient has been having increased thirst and increased urination  Physical deconditioning -More consistent participation with PT and OT. -Continues to require SNF level of care due to safety issues and impulsivity but are hopeful can manage behavior enough that patient can return home with family  Acute on chronic depression prior to admission/known polysubstance abuse/suicidal ideation -Attempted to maximize depression therapy but seem to make patient's mentation worse and did not improve symptoms therefore Zoloft 100 mg and Abilify 2 mg discontinued after being tapered -Patient with behaviors as well as written statements concerning for worsening depression as well as suicidal ideation although suspected this is primarily related to her brain injury -Psychiatric team updated and agrees with current medication regimen and safety  plan in place with one-to-one sitter, removal of any sharp objects from room and utilization of disposable meal tray  Large hemorrhoids. -has been on Proctofoam since 12/1 -will add sitz bath -if remain enlarged will ask surgery to evaluate given increased discomfort increase anxiety and agitation  Dysphagia -Continue regular diet -Given behavioral issues we will leave PEG tube in place for medications until patient consistently takes medications by mouth -12/9 have transitioned all meds to by mouth.  If tolerates can likely consider continuing PEG tube but would need to demonstrate consistent acceptance of medications by mouth for at least 2 weeks  Nutrition Status: Nutrition Problem: Inadequate oral intake Etiology: lethargy/confusion Signs/Symptoms: meal completion < 25% Interventions: Tube feeding,Prostat-12/6 tube feeding discontinued in favor of regular diet, prostat supplement and Ensure supplements      Other problems: Nonobstructive transaminitis in context of hepatitis C -Elevated HCV antibody with markedly elevated HCV RNA quantitative level  consistent with chronic hepatitis C  -None total bilirubin normal with slightly elevated alkaline phosphatase with AST 165 and ALT 274 -11/10: Discussed with ID Dr. Manson PasseyMandahar.  Treatment of hepatitis C typically is initiated in the outpatient setting.  Medications can be quite expensive and usually take time to obtain insurance  approval.  Also since patient likely go to skilled nursing facility expensive medication could be barrier to admission. -ID recommends scheduling outpatient appointment their clinic closer to discharge date  ? Breast enlargement/gynecomastia -No clinical evidence of breast enlargement -Prolactin level low with normal FSH -Appearance of breast enlargement reported by family likely related to improve nutrition as well as abdominal binder moving up underneath breasts and malpositioning of  breasts.  Diarrhea>>>Constipation Resolved Felt secondary to laxatives but given Salmonella UTI could have been related to Salmonella although at the time diarrhea was being worked up patient had no leukocytosis, no abdominal pain and no fevers.  Suspected oral Candida -Resolved after oral nystatin  Low back pain -Resolved -Completed 5 days of ibuprofen -Continue Voltaren gel and add as needed Flexeril (will DC Robaxin since there is to be ineffective)  Salmonella UTI -Completed amoxicillin    Data Reviewed: Basic Metabolic Panel: Recent Labs  Lab 03/25/20 1248 04/01/20 0349  NA 135 137  K 4.6 4.0  CL 100 100  CO2 24 27  GLUCOSE 289* 129*  BUN 12 11  CREATININE 0.65 0.51  CALCIUM 8.7* 9.1   Liver Function Tests: Recent Labs  Lab 03/25/20 1248 04/01/20 0349  AST 114* 97*  ALT 158* 127*  ALKPHOS 150* 181*  BILITOT 0.4 0.5  PROT 6.4* 6.2*  ALBUMIN 3.6 3.3*   No results for input(s): LIPASE, AMYLASE in the last 168 hours. No results for input(s): AMMONIA in the last 168 hours. CBC: Recent Labs  Lab 03/25/20 1248 04/01/20 0349  WBC 5.8 5.4  NEUTROABS 3.2  --   HGB 13.3 12.7  HCT 38.0 38.7  MCV 93.6 96.5  PLT 343 348   Cardiac Enzymes: No results for input(s): CKTOTAL, CKMB, CKMBINDEX, TROPONINI in the last 168 hours. BNP (last 3 results) No results for input(s): BNP in the last 8760 hours.  ProBNP (last 3 results) No results for input(s): PROBNP in the last 8760 hours.  CBG: No results for input(s): GLUCAP in the last 168 hours.  No results found for this or any previous visit (from the past 240 hour(s)).   Studies: No results found.  Scheduled Meds: . carbamazepine  200 mg Oral BID  . Chlorhexidine Gluconate Cloth  6 each Topical Daily  . cyclobenzaprine  5 mg Oral TID  . diclofenac Sodium  4 g Topical QID  . enoxaparin (LOVENOX) injection  40 mg Subcutaneous Q24H  . feeding supplement  237 mL Oral TID BM  . feeding supplement  (PROSource TF)  45 mL Per Tube TID  . free water  400 mL Per Tube Q6H  . Gerhardt's butt cream  1 application Topical TID  . hydrocortisone-pramoxine  1 applicator Rectal BID  . hydrOXYzine  10 mg Per Tube TID  . lacosamide  200 mg Oral BID  . multivitamin with minerals  1 tablet Oral Daily  . naltrexone  50 mg Oral Daily  . phenobarbital  64.8 mg Oral BID  . phenytoin  200 mg Oral BID  . vitamin B-6  100 mg Oral Daily  . QUEtiapine  100 mg Oral q morning - 10a  . QUEtiapine  200 mg Oral QHS  . thiamine  100 mg Oral Daily   Continuous Infusions: . sodium chloride      Principal Problem:   Refractory seizure (HCC) Active Problems:   Acute metabolic encephalopathy   Hypokalemia   Polysubstance abuse (HCC)   Elevated CK   Transaminitis   Chronic hepatitis C  without hepatic coma (HCC)   Distended abdomen   Palliative care encounter   Colonic Ileus (HCC)   Organic brain syndrome   On enteral nutrition   Physical deconditioning   Protein-calorie malnutrition (HCC)   Inadequate oral nutritional intake   Consultants:  Neurology  Psychiatry  Interventional radiology  Surgery  Procedures:  9/14 lumbar puncture  9/15 EEG  9/16 EEG  9/17 EEG  9/19 overnight EEG with video  9/22 overnight EEG with video with discontinuation of long-term EEG monitoring on 9/25  9/24 core track placement  10/6 EEG  Antibiotics: Anti-infectives (From admission, onward)   Start     Dose/Rate Route Frequency Ordered Stop   03/19/20 1000  metroNIDAZOLE (FLAGYL) tablet 500 mg  Status:  Discontinued        500 mg Per Tube 2 times daily 03/19/20 0822 03/23/20 0827   03/18/20 1200  amoxicillin (AMOXIL) 250 MG/5ML suspension 500 mg  Status:  Discontinued        500 mg Per Tube Every 8 hours 03/18/20 0915 03/23/20 0559   03/17/20 1200  cefTRIAXone (ROCEPHIN) 1 g in sodium chloride 0.9 % 100 mL IVPB  Status:  Discontinued        1 g 200 mL/hr over 30 Minutes Intravenous Every 24 hours  03/17/20 1048 03/18/20 0913   03/16/20 1130  metroNIDAZOLE (FLAGYL) 50 mg/ml oral suspension 500 mg  Status:  Discontinued        500 mg Per Tube 2 times daily 03/16/20 1040 03/19/20 0821   03/16/20 1130  fluconazole (DIFLUCAN) 40 MG/ML suspension 152 mg        150 mg Per Tube  Once 03/16/20 1040 03/16/20 1245   02/11/20 1526  ceFAZolin (ANCEF) IVPB 2g/100 mL premix        2 g 200 mL/hr over 30 Minutes Intravenous  Once 02/11/20 1526 02/11/20 1641   02/05/20 0600  ceFAZolin (ANCEF) IVPB 2g/100 mL premix        2 g 200 mL/hr over 30 Minutes Intravenous To Short Stay 02/04/20 1054 02/05/20 0950   01/29/20 0600  ceFAZolin (ANCEF) IVPB 2g/100 mL premix        2 g 200 mL/hr over 30 Minutes Intravenous On call to O.R. 01/28/20 1511 01/30/20 0559   01/28/20 2000  cefTRIAXone (ROCEPHIN) 1 g in sodium chloride 0.9 % 100 mL IVPB        1 g 200 mL/hr over 30 Minutes Intravenous Every 24 hours 01/28/20 1925 02/02/20 0724   01/06/20 0900  cefTRIAXone (ROCEPHIN) 2 g in sodium chloride 0.9 % 100 mL IVPB  Status:  Discontinued        2 g 200 mL/hr over 30 Minutes Intravenous Every 12 hours 01/06/20 0105 01/06/20 2202   01/06/20 0800  vancomycin (VANCOCIN) IVPB 1000 mg/200 mL premix  Status:  Discontinued       "Followed by" Linked Group Details   1,000 mg 200 mL/hr over 60 Minutes Intravenous Every 12 hours 01/05/20 1904 01/06/20 2202   01/05/20 2000  vancomycin (VANCOREADY) IVPB 1500 mg/300 mL       "Followed by" Linked Group Details   1,500 mg 150 mL/hr over 120 Minutes Intravenous  Once 01/05/20 1904 01/06/20 0127   01/05/20 1830  cefTRIAXone (ROCEPHIN) 2 g in sodium chloride 0.9 % 100 mL IVPB        2 g 200 mL/hr over 30 Minutes Intravenous  Once 01/05/20 1829 01/05/20 2220       Time spent: 30  minutes    [name] ANP  [facility] Pager [phone].  04/01/2020, 12:25 PM  LOS: 86 days

## 2020-04-01 NOTE — [place] FL2 (Signed)
MEDICAID FL2 LEVEL OF CARE SCREENING TOOL     IDENTIFICATION  Patient Name: Anita Davis Birthdate: 1985/05/31 Sex: female Admission Date (Current Location): 01/04/2020  [facility] and [place] Number:  [employer] and Address:  [facility] [place], [address] [place], [place] [address]      Provider Number: [id number]  Attending Physician Name and Address:  Osvaldo Shipper, MD  Relative Name and Phone Number:       Current Level of Care: Hospital Recommended Level of Care: Skilled Nursing Facility Prior Approval Number:    Date Approved/Denied:   PASRR Number: [id number]  Discharge Plan: SNF    Current Diagnoses: Patient Active Problem List   Diagnosis Date Noted  . Impulse disorder   . 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 On enteral nutrition 02/25/2020  . Physical deconditioning 02/25/2020  . Protein-calorie malnutrition (HCC) 02/25/2020  . Inadequate oral nutritional intake 02/25/2020  . Colonic Ileus (HCC) 02/04/2020  . Distended abdomen   . Palliative care encounter   . Chronic hepatitis C without hepatic coma (HCC) 01/13/2020  . Elevated CK 01/06/2020  . Transaminitis 01/06/2020  . 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 Acute metabolic encephalopathy 01/05/2020  . Hypokalemia 01/05/2020  . Polysubstance abuse (HCC) 01/05/2020  . Acute cholecystitis   . Anemia   . Abdominal pain 01/24/2017  . Hyperglycemia 01/24/2017  . Fever 01/24/2017    Orientation RESPIRATION BLADDER Height & Weight     Self,Time,Situation,Place  Normal Continent Weight: 159 lb 13.3 oz (72.5 kg) Height:  5\' 5"  (165.1 cm)  BEHAVIORAL SYMPTOMS/MOOD NEUROLOGICAL BOWEL NUTRITION STATUS    Convulsions/Seizures Continent Diet (See DC summary)  AMBULATORY STATUS COMMUNICATION OF NEEDS Skin   Limited Assist Verbally Skin abrasions,Surgical wounds (bilateral arms and legs, PEG insertion on abdomen)                       Personal Care  Assistance Level of Assistance  Bathing,Feeding,Dressing Bathing Assistance: Limited assistance Feeding assistance: Independent Dressing Assistance: Limited assistance     Functional Limitations Info  Sight,Hearing,Speech Sight Info: Adequate Hearing Info: Adequate Speech Info: Impaired    SPECIAL CARE FACTORS FREQUENCY  PT (By licensed PT),OT (By licensed OT),Speech therapy     PT Frequency: 5x/wk OT Frequency: 5x/wk     Speech Therapy Frequency: 5x/wk      Contractures Contractures Info: Not present    Additional Factors Info  Code Status,Allergies,Psychotropic Code Status Info: Full Allergies Info: NKA Psychotropic Info: Vimpat 200 mg BiD/ Phenobarb 60 mg BiD (20mg / 7ml soln)/ Dilantin soln 200 mg BiD/ Zoloft 37.5 mg Daily         Current Medications (04/01/2020):  This is the current hospital active medication list Current Facility-Administered Medications  Medication Dose Route Frequency Provider Last Rate Last Admin  . acetaminophen (TYLENOL) tablet 650 mg  650 mg Oral Q6H PRN 4m, NP   650 mg at 04/01/20 0646  . carbamazepine (TEGRETOL) tablet 200 mg  200 mg Oral BID Russella Dar, NP   200 mg at 04/01/20 0846  . Chlorhexidine Gluconate Cloth 2 % PADS 6 each  6 each Topical Daily Russella Dar, MD   6 each at 04/01/20 [phone]  . cyclobenzaprine (FLEXERIL) tablet 5 mg  5 mg Oral TID 14/10/21, NP   5 mg at 04/01/20 0845  . diclofenac Sodium (VOLTAREN) 1 % topical gel 4 g  4 g Topical QID Russella Dar,  NP   4 g at 04/01/20 0850  . enoxaparin (LOVENOX) injection 40 mg  40 mg Subcutaneous Q24H [place], Eric J, DO   40 mg at 04/01/20 1304  . feeding supplement (ENSURE ENLIVE / ENSURE PLUS) liquid 237 mL  237 mL Oral TID BM Kathlen Mody, MD   237 mL at 04/01/20 1303  . feeding supplement (PROSource TF) liquid 45 mL  45 mL Per Tube TID Russella Dar, NP   45 mL at 04/01/20 0847  . free water 400 mL  400 mL Per Tube Q6H [name] L, NP   400  mL at 04/01/20 1304  . Gerhardt's butt cream 1 application  1 application Topical TID Narda Bonds, MD   1 application at 03/31/20 1744  . Gerhardt's butt cream   Topical PRN Narda Bonds, MD   Given at 03/07/20 1540  . haloperidol lactate (HALDOL) injection 5 mg  5 mg Intramuscular Q6H PRN Kathlen Mody, MD   5 mg at 04/01/20 0028  . hydrocortisone-pramoxine (PROCTOFOAM-HC) rectal foam 1 applicator  1 applicator Rectal BID Russella Dar, NP   1 applicator at 03/31/20 8032  . hydrOXYzine (ATARAX/VISTARIL) tablet 10 mg  10 mg Per Tube TID Russella Dar, NP   10 mg at 04/01/20 0846  . hydrOXYzine (VISTARIL) injection 50 mg  50 mg Intramuscular Q6H PRN Russella Dar, NP   50 mg at 03/30/20 0803  . lacosamide (VIMPAT) tablet 200 mg  200 mg Oral BID Russella Dar, NP   200 mg at 04/01/20 1224  . multivitamin with minerals tablet 1 tablet  1 tablet Oral Daily Russella Dar, NP   1 tablet at 04/01/20 0846  . naltrexone (DEPADE) tablet 50 mg  50 mg Oral Daily Russella Dar, NP   50 mg at 04/01/20 0845  . PHENobarbital (LUMINAL) tablet 64.8 mg  64.8 mg Oral BID Russella Dar, NP   64.8 mg at 04/01/20 0845  . phenytoin (DILANTIN) chewable tablet 200 mg  200 mg Oral BID Russella Dar, NP   200 mg at 04/01/20 0846  . pyridOXINE (VITAMIN B-6) tablet 100 mg  100 mg Oral Daily Russella Dar, NP   100 mg at 04/01/20 0845  . QUEtiapine (SEROQUEL) tablet 100 mg  100 mg Oral q morning - 10a Russella Dar, NP   100 mg at 04/01/20 0845  . QUEtiapine (SEROQUEL) tablet 200 mg  200 mg Oral QHS Russella Dar, NP   200 mg at 03/31/20 2350  . sodium chloride 0.9 % bolus 500 mL  500 mL Intravenous Once Kathlen Mody, MD      . thiamine tablet 100 mg  100 mg Oral Daily Russella Dar, NP   100 mg at 04/01/20 0846  . zolpidem (AMBIEN) tablet 5 mg  5 mg Oral QHS PRN Russella Dar, NP         Discharge Medications: Please see discharge summary for a list of discharge  medications.  Relevant Imaging Results:  Relevant Lab Results:   Additional Information SS#: [id number]---UGQB be a LOG with disability pending  Carley Hammed, LCSWA

## 2020-04-01 NOTE — Plan of Care (Signed)

## 2020-04-01 NOTE — Progress Notes (Signed)
Occupational Therapy Treatment [name] MRN: [id number] DOB: [birth year] Today's Date: 04/01/2020    History of present illness 34 year old with past medical history significant for hepatitis C, polysubstance abuse brought to the hospital 9/13 for disorientation which was suspected to be substance abuse.  After she became more lucid, she was discharged, but then police brought her back later in the day and they found her passed out in a parking lot.  Urine drug screen was positive for benzodiazepine and THC. An EEG done on 9/15 showed patient was in status epilepticus.  Patient was a started on Keppra.  Despite these, EEG noted continued seizures requiring additional medications as well.  Finally on 9/18, seizures broke. Follow-up EEG on 9/20 noted evidence of epilepticity from the left central temporal region.  Repeat EEG done on 9/22 noted epileptogenicity from the left central temporal region.  Pt with PEG placed on 02/05/20.    OT comments  Pt meet all goals this session and goals are updated due to progress. Pt was fully dressed in PJ set and eating chips on arrival. Pt greeting staff appropriately and cheerful to walk to a new area of hospital. Pt thanking staff for visit. Ot to keep on caseload due to wax / wean noted this admission for 2 more visits. Recommendation for SNF ( d/c home with family will require initial 24/7 (A) due to safety awareness)  Pt reading book title and giving a short description of what book is about.    Follow Up Recommendations  SNF (possible d/c home with family per chart. will need 24/7)    Equipment Recommendations  3 in 1 bedside commode    Recommendations for Other Services      Precautions / Restrictions Precautions Precautions: Fall Precaution Comments: Seizure, PEG, abdominal binder       Mobility Bed Mobility Overal bed mobility: Modified Independent                Transfers Overall transfer level: Needs  assistance   Transfers: Sit to/from Stand Sit to Stand: Supervision              Balance Overall balance assessment: Needs assistance   Sitting balance-Leahy Scale: Good       Standing balance-Leahy Scale: Fair                             ADL either performed or assessed with clinical judgement   ADL Overall ADL's : Needs assistance/impaired Eating/Feeding: Independent Eating/Feeding Details (indicate cue type and reason): pt locating candy in candy drawer, opening three different wrapper types without any A. pt opening bag of chips Grooming: Wash/dry hands;Supervision/safety                   Toilet Transfer: Ambulation;Supervision/safety             General ADL Comments: pt with slow cadence but able to ambulate to rehab office and back. pt unable to locate from memory but with clues able locate room 27. pt does demonstrate recall of her bed type looking in a different room and says "its like that bed but thats not it"     Vision       Perception     Praxis      Cognition Arousal/Alertness: Awake/alert Behavior During Therapy: Flat affect Overall Cognitive Status: Impaired/Different from baseline Area of Impairment: Awareness;Safety/judgement  Orientation Level: Disoriented to;Situation;Time;Place Current Attention Level: Selective Memory: Decreased short-term memory   Safety/Judgement: Decreased awareness of safety Awareness: Intellectual Problem Solving: Slow processing;Difficulty sequencing General Comments: Pt was unable to pathfind her way back to room with mod cues. pt educated room number asked to repeat it back and then unable recall in 30 seconds. pt needs mod cues to help navigate in the correct numberal direction. Pt cheerful and enjoyed navigating to rehab office for candy        Exercises     Shoulder Instructions       General Comments      Pertinent Vitals/ Pain       Pain Assessment:  No/denies pain  Home Living                                          Prior Functioning/Environment              Frequency  Min 2X/week        Progress Toward Goals  OT Goals(current goals can now be found in the care plan section)  Progress towards OT goals: Goals met and updated - see care plan  Acute Rehab OT Goals Patient Stated Goal: none stated OT Goal Formulation: With patient Time For Goal Achievement: 04/15/20 Potential to Achieve Goals: Good ADL Goals Pt Will Perform Eating:  (met) Pt Will Perform Grooming: with modified independence;standing Additional ADL Goal #1: Pt will folow 5 step pathfinding task with written instructions min (A) (met) Additional ADL Goal #2:  (met) Additional ADL Goal #3:  (met)  Plan Discharge plan remains appropriate;Frequency needs to be updated    Co-evaluation                 AM-PAC OT "6 Clicks" Daily Activity     Outcome Measure   Help from another person eating meals?: None Help from another person taking care of personal grooming?: A Little Help from another person toileting, which includes using toliet, bedpan, or urinal?: A Little Help from another person bathing (including washing, rinsing, drying)?: A Little Help from another person to put on and taking off regular upper body clothing?: A Little Help from another person to put on and taking off regular lower body clothing?: A Little 6 Click Score: 19    End of Session    OT Visit Diagnosis: Unsteadiness on feet (R26.81);Muscle weakness (generalized) (M62.81);Pain;Other symptoms and signs involving the nervous system (R29.898);Other symptoms and signs involving cognitive function;Cognitive communication deficit (R41.841);Ataxia, unspecified (R27.0);Feeding difficulties (R63.3);Other abnormalities of gait and mobility (R26.89)   Activity Tolerance Patient tolerated treatment well   Patient Left in bed;with call bell/phone within reach;with  nursing/sitter in room   Nurse Communication Mobility status;Precautions        Time: [id number] OT Time Calculation (min): 16 min  Charges: OT General Charges $OT Visit: 1 Visit OT Treatments $Self Care/Home Management : 8-22 mins   Brynn, OTR/L  Acute Rehabilitation Services Pager: [phone] Office: [phone] .    Jeri Modena 04/01/2020, 4:20 PM

## 2020-04-02 LAB — HEPATITIS C GENOTYPE

## 2020-04-02 MED ORDER — IBUPROFEN 200 MG PO TABS
400.0000 mg | ORAL_TABLET | Freq: Four times a day (QID) | ORAL | Status: DC | PRN
  Administered 2020-04-02 – 2020-04-03 (×4): 400 mg via ORAL
  Filled 2020-04-02 (×4): qty 2

## 2020-04-02 NOTE — Progress Notes (Signed)
Pt has been restless most of this shift. PRN hydroxyzine IM with minimal effect. Pt is redirected by ambulating in the hallway with patient care tech sitter. Ambulating seems to calm her down. All meds were given without difficulty. Pt being more picky about eating her meals however stating "I want the peg tube feeding". Educated patient on oral intake and the plan of care to no longer provide nutrition through he tube. She verbalized understanding.

## 2020-04-02 NOTE — Progress Notes (Signed)
Please review the detailed progress note by [name] dated 12/10 for details regarding patient's hospital stay and her various medical problems.  S: Patient answering in monosyllables.  States that she slept well.  Denies any pain.  Not very communicative.  No nausea vomiting reported by nursing staff.  Apparently she slept well overnight.  Continues to have behavioral issues on and off .  Requires sitter around-the-clock.  O: Vital signs are all stable.  She is afebrile. S1-S2 is normal regular.  No S3-S4. Lungs are clear to auscultation bilaterally Moving all her extremities. No obvious focal neurological deficits noted.  Labs were last done on 12/10.  Reviewed.  Abnormal LFTs noted which has been stable for the last several days.  Other labs unremarkable except for low phenytoin level.  We will recheck it on Monday.  HbA1c was noted to be 5.8 recently.  Hepatitis C antibody is reactive as known previously.  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  She is currently on antiepileptics including phenobarbital and Dilantin.  The Tegretol is mainly for behavioral issues. See below. Phenytoin level was noted to be low yesterday.  This was because she missed a day or 2 when she was being transitioned from medications on the tube to oral.  We will recheck her levels on Monday.  No seizure activity noted recently.  She is also noted to be on Seroquel and Tegretol for her behavioral issues.  Previously has been on Zyprexa with which the patient worsened.  Requires Geodon as needed.  Also noted to be on Vistaril.  Rest of her medical issues appear to be stable.  Please review Mora Bellman note from 12/10 for details.  Continue to follow.  Discussed with the charge nurse.  Osvaldo Shipper 04/02/2020

## 2020-04-02 NOTE — Consult Note (Addendum)
Patient seen in her hospital room face to face but refused to participate in Capacity evaluation despite multiple attempts.  Recommendation:  Re-consult when patient is able to participate in an assessment.    Thedore Mins, MD Attending psychiatrist

## 2020-04-02 NOTE — TOC Progression Note (Signed)
Transition of Care [facility]) - Progression Note    Patient Details  Name: Anita Davis MRN: [id number] Date of Birth: [birth year]  [facility] CM/SW Contact  Carley Hammed, [place] Phone Number: 04/02/2020, 1:45 PM  Clinical Narrative:     CSW attempted to fax referral to [facility] x4. Called facility to confirm fax number. They confirmed and stated fax is running slow on their end and to just keep trying. SW will continue to attempt to provide referral.  Expected Discharge Plan: Skilled Nursing Facility Barriers to Discharge: Continued Medical Work up,Inadequate or no insurance,Active Substance Use - Placement,SNF Pending payor source - LOG,SNF Pending Medicaid,SNF Pending bed offer  Expected Discharge Plan and Services Expected Discharge Plan: Skilled Nursing Facility In-house Referral: Artist   Post Acute Care Choice: Skilled Nursing Facility Living arrangements for the past 2 months: Mobile Home                                       Social Determinants of Health (SDOH) Interventions    Readmission Risk Interventions No flowsheet data found.

## 2020-04-02 NOTE — Progress Notes (Signed)
Pt continuing to yell out and raising her arms and legs. Unable to calm her despite ambulating in the hallway, distraction with television and music and other conversations. Her restlessness and agitation is increasing. PRN Haldol to be given.

## 2020-04-03 NOTE — Progress Notes (Signed)
Pt calm and resting quietly in bed with eyes closed. No agitation noted. Remain on 1:1 close observation for safety and suicidal ideation.

## 2020-04-03 NOTE — Progress Notes (Signed)
Please review the detailed progress note by [name] dated 12/10 for details regarding patient's hospital stay and her various medical problems.  S: Overnight events noted.  Patient not answering any questions currently.  She has a smile on her face.  Does not appear to be in any pain.  Sitter at bedside.    O: Vital signs noted to be stable.  She remains afebrile  S1-S2 is normal regular. Lungs are clear to auscultation bilaterally Examination of the back does not reveal any swelling or wounds.  Spine was palpated without any tenderness.  She is noted to be moving all of her extremities.   No focal deficits.    Labs were last done on 12/10.  Reviewed.  Abnormal LFTs noted which has been stable for the last several days.  Other labs unremarkable except for low phenytoin level.  We will recheck it on Monday.  HbA1c was noted to be 5.8 recently.  Hepatitis C antibody is reactive as known previously.  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  She is currently on antiepileptics including phenobarbital and Dilantin.  The Tegretol is mainly for behavioral issues. See below. Phenytoin level was noted to be low on 12/10.  This was because she missed a day or 2 when she was being transitioned from medications on the tube to oral.  We will recheck her levels on Monday.  No seizure activity noted recently.  She is also noted to be on Seroquel and Tegretol for her behavioral issues.  Previously has been on Zyprexa with which the patient worsened.  Requires Geodon as needed.  Also noted to be on Vistaril.  Overnight events noted with patient being more agitated.  She was given Haldol earlier this morning.  Seems to be reasonably calm this morning.  May need to use Geodon again.  Discussed with the nursing staff.  Psychiatry did try to see the patient yesterday morning but  patient would not engage with them.  Rest of her medical issues appear to be stable.  Please review Mora Bellman note from 12/10 for details.  We will continue to follow.  Discussed with patient's nurse as well as the charge nurse.  Osvaldo Shipper 04/03/2020
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
## 2020-04-04 LAB — COMPREHENSIVE METABOLIC PANEL
ALT: 89 U/L — ABNORMAL HIGH (ref 0–44)
AST: 87 U/L — ABNORMAL HIGH (ref 15–41)
Albumin: 3.1 g/dL — ABNORMAL LOW (ref 3.5–5.0)
Alkaline Phosphatase: 159 U/L — ABNORMAL HIGH (ref 38–126)
Anion gap: 12 (ref 5–15)
BUN: 16 mg/dL (ref 6–20)
CO2: 26 mmol/L (ref 22–32)
Calcium: 8.8 mg/dL — ABNORMAL LOW (ref 8.9–10.3)
Chloride: 98 mmol/L (ref 98–111)
Creatinine, Ser: 0.6 mg/dL (ref 0.44–1.00)
GFR, Estimated: >60 mL/min (ref >60)
Glucose, Bld: 145 mg/dL — ABNORMAL HIGH (ref 70–99)
Potassium: 4.3 mmol/L (ref 3.5–5.1)
Sodium: 136 mmol/L (ref 135–145)
Total Bilirubin: 0.8 mg/dL (ref 0.3–1.2)
Total Protein: 6 g/dL — ABNORMAL LOW (ref 6.5–8.1)

## 2020-04-04 LAB — PHENYTOIN LEVEL, TOTAL: Phenytoin Lvl: 7.5 ug/mL — ABNORMAL LOW (ref 10.0–20.0)

## 2020-04-04 LAB — MAGNESIUM: Magnesium: 2 mg/dL (ref 1.7–2.4)

## 2020-04-04 MED ORDER — PSYLLIUM 95 % PO PACK
1.0000 | PACK | Freq: Two times a day (BID) | ORAL | Status: DC
  Administered 2020-04-04 – 2020-04-14 (×20): 1 via ORAL
  Filled 2020-04-04 (×22): qty 1

## 2020-04-04 MED ORDER — QUETIAPINE FUMARATE 200 MG PO TABS
400.0000 mg | ORAL_TABLET | Freq: Every day | ORAL | Status: DC
  Administered 2020-04-04 – 2020-04-05 (×2): 400 mg via ORAL
  Filled 2020-04-04 (×2): qty 2

## 2020-04-04 MED ORDER — QUETIAPINE FUMARATE 200 MG PO TABS
200.0000 mg | ORAL_TABLET | Freq: Every morning | ORAL | Status: DC
  Administered 2020-04-04 – 2020-04-05 (×2): 200 mg via ORAL
  Filled 2020-04-04 (×2): qty 1

## 2020-04-04 MED ORDER — IBUPROFEN 200 MG PO TABS
600.0000 mg | ORAL_TABLET | Freq: Four times a day (QID) | ORAL | Status: DC | PRN
  Administered 2020-04-04 – 2020-04-07 (×8): 600 mg via ORAL
  Filled 2020-04-04 (×10): qty 3

## 2020-04-04 MED ORDER — KETOROLAC TROMETHAMINE 60 MG/2ML IM SOLN
60.0000 mg | Freq: Once | INTRAMUSCULAR | Status: AC
  Administered 2020-04-04: 17:00:00 60 mg via INTRAMUSCULAR
  Filled 2020-04-04: qty 2

## 2020-04-04 MED ORDER — HYDROXYZINE HCL 10 MG PO TABS
10.0000 mg | ORAL_TABLET | Freq: Three times a day (TID) | ORAL | Status: DC
  Administered 2020-04-04 – 2020-04-11 (×23): 10 mg via ORAL
  Filled 2020-04-04 (×25): qty 1

## 2020-04-04 NOTE — Progress Notes (Addendum)
[facility] PROGRESS NOTE  Anita Davis ZOX:[id number] DOB: Sep 05, 1985 DOA: 01/04/2020 PCP: Anita Hawking, PA-C            12/8     Status: Remains inpatient appropriate because:Altered mental status and Unsafe d/c plan  Dispo:  The patient is from: Home              Anticipated d/c is to: SNF vs home with family.  Extensive conversation had with patient's stepmother Anita Davis.  She has been in contact with a local nursing facility in [facility] that apparently accept patients with neuro psychiatric issues.  It is best for patient if we are able to locate a facility that is close to family so they can visit her daily              Anticipated d/c date is: > 3 days              Patient currently is medically stable to d/c.  Barrier to discharge is need for SNF placement most likely at a facility that can accommodate behavioral issues similar to dementia patients, Medicaid and disability applications in process.  TOC given contact information for above-stated nursing facility in [facility]   Code Status: Full Family Communication: Stepmother Anita Davis on 12/13 as well as patient's father on separate call. DVT prophylaxis: Lovenox Vaccination status: Family confirmed initial Pfizer Covid vaccine dose given in June, second dose given on 11/2  Foley catheter: No  HPI: 34 year old female with known history of substance abuse on prior methadone, active hepatitis C and gestational diabetes who presented initially with confusion.  She was initially admitted to [facility] in September with a diagnosis of encephalopathy presumed secondary to drug overdose.  Subsequent work-up inconclusive.  LP without signs of infection, MRI brain unremarkable and unfortunately she remained encephalopathic.  EEG did reveal focal seizures.  AED therapy was initiated and seizures were suppressed and she was transferred to [facility] for continuous EEG.  Apparently had episodes of status epilelepticus before  full suppression of seizures with medications.  Subsequently neurological recovery was poor and delayed.  Neurology suspicious for autoimmune encephalitis and was given high-dose steroids with equivocal improvement but deteriorated after steroids completed.  Was given IVIG without any significant improvement in mentation.  Since that time patient has had waxing and waning mentation ranging from unresponsive to alert confused and verbal and at times restless and agitated.  When she is awake/alert she is able to ambulate but requires assistance due to impulsivity and gait instability.  Unfortunately these episodes of wakefulness are never sustained.  It is suspected that patient has a chronic sequela from her encephalopathy and seizures therefore long-term SNF has been recommended.  He is inconsistent with oral intake and continues to require tube feedings for nutrition and hydration.  Subjective: Alert.  Patient complaining of significant back pain.  Began crying.  Later became extremely agitated while waiting upon medications to arrive.  Wiggling bottom on bed was unable to verbalize if hemorrhoids contributing to back pain.  Later more calm but with persistent flat affect with some appropriate verbalization after receiving oral ibuprofen and plan was to give Toradol 60 mg IM x1 as well with ordered Flexeril prn  Objective: Vitals:   04/04/20 0003 04/04/20 0359  BP: 97/61 (!) 93/59  Pulse: 92 73  Resp: 18 20  Temp: 97.9 F (36.6 C) (!) 97.5 F (36.4 C)  SpO2: 98% 100%    Intake/Output Summary (Last 24 hours) at  04/04/2020 1203 Last data filed at 04/04/2020 0600 Gross per 24 hour  Intake 1307 ml  Output 1 ml  Net 1306 ml   Filed Weights   03/26/20 0333 03/29/20 0500 04/04/20 0865  Weight: 75.3 kg 72.5 kg 70.9 kg    Exam:  Constitutional: NAD, mostly calm but does have impulsive and self agitated behavior when becomes afraid or develops pain or other stressors Respiratory: clear to  auscultation bilaterally. Normal respiratory effort. RA Cardiovascular: Regular rate and rhythm, No extremity edema.  Skin warm and dry Abdomen: no tenderness, no masses palpated.  Bowel sounds positive.  PEG tube in place.  External hemorrhoids markedly decreased in size with utilization of Proctofoam Musculoskeletal: no clubbing / cyanosis. No joint deformity upper and lower extremities. Good ROM,  Normal muscle tone.  Neurologic: CN 2-12 grossly intact. Sensation intact, Strength 5/5 x all 4 extremities.  Improvement in gait.   Psychiatric: Alert and oriented times name and place.  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 Zoloft and Abilify contributed to ongoing lethargy so tapered and discontinued.  Abilify also caused dystonic movements -Benzodiazepines due to worsening agitation -Continue naltrexone for hyper sexualization (patient with history of PTSD and narcotic abuse therefore prior to admission). -Unfortunately patient had worsening of apathy and lethargy on SSRI so we will not utilize this medication -Symptoms worsened on Zyprexa but patient symptoms so discontinued.  -Seroquel resumed on 12/9 -12/12 am dose increased to 200 mg and pm dose increased to 400 mg. -Continue Tegretol 200 mg twice daily behavioral disturbances, agitation and impulsivity recommended by psychiatry. -Continue low-dose scheduled Vistaril to help with impulsivity as well -Continue one-to-one safety sitter because of impulsivity increased fall risk include wandering behaviors -Patient  improvement and behaviors markedly improved with consistent intake with family.  Recommendation for SNF placement should include placing patient in facility close to family to avoid regression in clinical status and worsening of behavior D/w Pam/CIR- rec having pt fu with the [facility] if able to be dc'd home  Hyperglycemia secondary to medication -Patient is having intermittent hyperglycemia with sugars in the high 250s at time on serum glucose -Repeated hemoglobin A1c -5.8 which is consistent with prediabetes -Antipsychotics such as Seroquel and Zyprexa can cause hyperglycemia and drug-related diabetes mellitus we will continue to follow closely; patient has been having increased thirst and increased urination  Physical deconditioning -More consistent participation with PT and OT. -Continues to require SNF level of care due to safety issues and impulsivity but are hopeful can manage behavior enough that patient can return home with family  Acute on chronic depression prior to admission/known polysubstance abuse/suicidal ideation -Attempted to maximize depression therapy but seem to make patient's mentation worse and did not improve symptoms therefore Zoloft 100 mg and Abilify 2 mg discontinued after being tapered -Patient with behaviors as well as written statements concerning for worsening depression as well as suicidal ideation although suspected this is primarily related to her brain injury -Psychiatric team updated and agrees with current medication regimen and safety plan in place with one-to-one sitter, removal of any sharp objects from room and utilization of disposable meal tray  Large hemorrhoids. -has been on Proctofoam since 12/1 although was having some low back pain and unusual behaviors concerning for hemorrhoids contributing -Hemorrhoids have decreased in size externally.  No significant findings on exam by surgical team on 12/13.  Recommendation is to continue Proctofoam  through 12/15  Dysphagia -Continue regular diet -Given behavioral issues we will leave PEG tube in place for medications until patient consistently takes medications by mouth   Nutrition Status: Nutrition Problem: Inadequate oral intake Etiology: lethargy/confusion Signs/Symptoms: meal completion for the most part is between 50 and 100% Interventions: Tube feeding,Prostat-12/6 tube feeding discontinued in favor of regular diet, prostat supplement and Ensure supplements      Other problems: Nonobstructive transaminitis in context of hepatitis C -Elevated HCV antibody with markedly elevated HCV RNA quantitative level  consistent with chronic hepatitis C  -None total bilirubin normal with slightly elevated alkaline phosphatase with AST 165 and ALT 274 -11/10: Discussed with ID Dr. Manson PasseyMandahar.  Treatment of hepatitis C typically is initiated in the outpatient setting.  Medications can be quite expensive and usually take time to obtain insurance approval.  Also since patient likely go to skilled nursing facility expensive medication could be barrier to admission. -ID recommends scheduling outpatient appointment their clinic closer to discharge date  ? Breast enlargement/gynecomastia -No clinical evidence of breast enlargement -Prolactin level low with normal FSH -Appearance of breast enlargement reported by family likely related to improve nutrition as well as abdominal binder moving up underneath breasts and malpositioning of breasts.  Diarrhea>>>Constipation Resolved Felt secondary to laxatives but given Salmonella UTI could have been related to Salmonella although at the time diarrhea was being worked up patient had no leukocytosis, no abdominal pain and no fevers.  Suspected oral Candida -Resolved after oral nystatin  Low back pain -Resolved -Completed 5 days of ibuprofen -Continue Voltaren gel and add as needed Flexeril (will DC Robaxin since there is to be  ineffective)  Salmonella UTI -Completed amoxicillin    Data Reviewed: Basic Metabolic Panel: Recent Labs  Lab 04/01/20 0349 04/04/20 0445  NA 137 136  K 4.0 4.3  CL 100 98  CO2 27 26  GLUCOSE 129* 145*  BUN 11 16  CREATININE 0.51 0.60  CALCIUM 9.1 8.8*  MG  --  2.0   Liver Function Tests: Recent Labs  Lab 04/01/20 0349 04/04/20 0445  AST 97* 87*  ALT 127* 89*  ALKPHOS 181* 159*  BILITOT 0.5 0.8  PROT 6.2* 6.0*  ALBUMIN 3.3* 3.1*   No results for input(s): LIPASE, AMYLASE in the last 168 hours. No results for input(s): AMMONIA in the last 168 hours. CBC: Recent Labs  Lab 04/01/20 0349  WBC 5.4  HGB 12.7  HCT 38.7  MCV 96.5  PLT 348   Cardiac Enzymes: No results for input(s): CKTOTAL, CKMB, CKMBINDEX, TROPONINI in the last 168 hours. BNP (last 3 results) No results for input(s): BNP in the last 8760 hours.  ProBNP (last 3 results) No results for input(s): PROBNP in the last 8760 hours.  CBG: No results for input(s): GLUCAP in the last 168 hours.  No results found for this or any previous visit (from the past 240 hour(s)).   Studies: No results found.  Scheduled Meds: . carbamazepine  200 mg Oral BID  . Chlorhexidine Gluconate Cloth  6 each Topical Daily  . cyclobenzaprine  5 mg Oral TID  . diclofenac Sodium  4 g Topical QID  . enoxaparin (LOVENOX) injection  40 mg Subcutaneous Q24H  . feeding  supplement  237 mL Oral TID BM  . feeding supplement (PROSource TF)  45 mL Per Tube TID  . free water  400 mL Per Tube Q6H  . Gerhardt's butt cream  1 application Topical TID  . hydrocortisone-pramoxine  1 applicator Rectal BID  . hydrOXYzine  10 mg Oral TID  . ketorolac  60 mg Intramuscular Once  . lacosamide  200 mg Oral BID  . multivitamin with minerals  1 tablet Oral Daily  . naltrexone  50 mg Oral Daily  . phenobarbital  64.8 mg Oral BID  . phenytoin  200 mg Oral BID  . psyllium  1 packet Oral BID  . vitamin B-6  100 mg Oral Daily  .  QUEtiapine  200 mg Oral q morning - 10a  . QUEtiapine  400 mg Oral QHS  . thiamine  100 mg Oral Daily   Continuous Infusions: . sodium chloride      Principal Problem:   Refractory seizure (HCC) Active Problems:   Acute metabolic encephalopathy   Hypokalemia   Polysubstance abuse (HCC)   Elevated CK   Transaminitis   Chronic hepatitis C without hepatic coma (HCC)   Distended abdomen   Palliative care encounter   Colonic Ileus (HCC)   Organic brain syndrome   On enteral nutrition   Physical deconditioning   Protein-calorie malnutrition (HCC)   Inadequate oral nutritional intake   Impulse disorder   Consultants:  Neurology  Psychiatry  Interventional radiology  Surgery  Procedures:  9/14 lumbar puncture  9/15 EEG  9/16 EEG  9/17 EEG  9/19 overnight EEG with video  9/22 overnight EEG with video with discontinuation of long-term EEG monitoring on 9/25  9/24 core track placement  10/6 EEG  Antibiotics: Anti-infectives (From admission, onward)   Start     Dose/Rate Route Frequency Ordered Stop   03/19/20 1000  metroNIDAZOLE (FLAGYL) tablet 500 mg  Status:  Discontinued        500 mg Per Tube 2 times daily 03/19/20 0822 03/23/20 0827   03/18/20 1200  amoxicillin (AMOXIL) 250 MG/5ML suspension 500 mg  Status:  Discontinued        500 mg Per Tube Every 8 hours 03/18/20 0915 03/23/20 0559   03/17/20 1200  cefTRIAXone (ROCEPHIN) 1 g in sodium chloride 0.9 % 100 mL IVPB  Status:  Discontinued        1 g 200 mL/hr over 30 Minutes Intravenous Every 24 hours 03/17/20 1048 03/18/20 0913   03/16/20 1130  metroNIDAZOLE (FLAGYL) 50 mg/ml oral suspension 500 mg  Status:  Discontinued        500 mg Per Tube 2 times daily 03/16/20 1040 03/19/20 0821   03/16/20 1130  fluconazole (DIFLUCAN) 40 MG/ML suspension 152 mg        150 mg Per Tube  Once 03/16/20 1040 03/16/20 1245   02/11/20 1526  ceFAZolin (ANCEF) IVPB 2g/100 mL premix        2 g 200 mL/hr over 30 Minutes  Intravenous  Once 02/11/20 1526 02/11/20 1641   02/05/20 0600  ceFAZolin (ANCEF) IVPB 2g/100 mL premix        2 g 200 mL/hr over 30 Minutes Intravenous To Short Stay 02/04/20 1054 02/05/20 0950   01/29/20 0600  ceFAZolin (ANCEF) IVPB 2g/100 mL premix        2 g 200 mL/hr over 30 Minutes Intravenous On call to O.R. 01/28/20 1511 01/30/20 0559   01/28/20 2000  cefTRIAXone (ROCEPHIN) 1 g in sodium  chloride 0.9 % 100 mL IVPB        1 g 200 mL/hr over 30 Minutes Intravenous Every 24 hours 01/28/20 1925 02/02/20 0724   01/06/20 0900  cefTRIAXone (ROCEPHIN) 2 g in sodium chloride 0.9 % 100 mL IVPB  Status:  Discontinued        2 g 200 mL/hr over 30 Minutes Intravenous Every 12 hours 01/06/20 0105 01/06/20 2202   01/06/20 0800  vancomycin (VANCOCIN) IVPB 1000 mg/200 mL premix  Status:  Discontinued       "Followed by" Linked Group Details   1,000 mg 200 mL/hr over 60 Minutes Intravenous Every 12 hours 01/05/20 1904 01/06/20 2202   01/05/20 2000  vancomycin (VANCOREADY) IVPB 1500 mg/300 mL       "Followed by" Linked Group Details   1,500 mg 150 mL/hr over 120 Minutes Intravenous  Once 01/05/20 1904 01/06/20 0127   01/05/20 1830  cefTRIAXone (ROCEPHIN) 2 g in sodium chloride 0.9 % 100 mL IVPB        2 g 200 mL/hr over 30 Minutes Intravenous  Once 01/05/20 1829 01/05/20 2220       Time spent: 20 minutes    [name] ANP  [facility] Pager [phone].  04/04/2020, 12:03 PM  LOS: 89 days

## 2020-04-04 NOTE — Progress Notes (Signed)
Pt had a small outburst, throwing her icepack to this Clinical research associate. Pt reminded that Rn's were notified of pt's pain. Pt only cried out louder. Will continue to monitor pt.

## 2020-04-04 NOTE — Plan of Care (Signed)
  Problem: Health Behavior/Discharge Planning: Goal: Ability to manage health-related needs will improve Outcome: Progressing   Problem: Clinical Measurements: Goal: Ability to maintain clinical measurements within normal limits will improve Outcome: Progressing   Problem: Clinical Measurements: Goal: Will remain free from infection Outcome: Progressing   Problem: Education: Goal: Knowledge of General Education information will improve Description: Including pain rating scale, medication(s)/side effects and non-pharmacologic comfort measures Outcome: Progressing

## 2020-04-04 NOTE — Progress Notes (Signed)
Pt.s breakfast has arrived. Pt washed up and sat up to eat breakfast, but pt kept asking to be fed through her tube. This Clinical research associate encouraged pt to eat her food as it would make her feel good, but pt rejected any food or drink by pushing on her side rails and having small cry outbursts. Attempted to calm down pt, but pt continues to do so. Will continue to monitor pt.

## 2020-04-04 NOTE — Progress Notes (Addendum)
[facility] Progress Note  59 Days Post-Op  Subjective: CC:  Not speaking much, eating cookies. Appears restless. Does not report pain. Per primary team has not had rectal bleeding.   Objective: Vital signs in last 24 hours: Temp:  [97.5 F (36.4 C)-97.9 F (36.6 C)] 97.5 F (36.4 C) (12/13 0359) Pulse Rate:  [73-94] 73 (12/13 0359) Resp:  [18-20] 20 (12/13 0359) BP: (93-133)/(59-99) 93/59 (12/13 0359) SpO2:  [98 %-100 %] 100 % (12/13 0359) Weight:  [70.9 kg] 70.9 kg (12/13 0632) Last BM Date: 04/01/20  Intake/Output from previous day: 12/12 0701 - 12/13 0700 In: 1547 [P.O.:627; NG/GT:800] Out: 1 [Urine:1] Intake/Output this shift: No intake/output data recorded.  PE: Gen:  Alert, NAD, pleasant Card:  Regular rate and rhythm Pulm:  Normal effort Abd: Soft, non-tender, non-distended, G tube site clean and dry.  GU: on external peri-rectal exam there is a very small, non-inflamed hemorrhoid in the 5 o'clock position. DRE without gross blood, there was a small, palpable internal hemorrhoid in the 6 o'clock position that was non-tender.  Skin: warm and dry, no rashes  Psych: A&Ox3   Lab Results:  No results for input(s): WBC, HGB, HCT, PLT in the last 72 hours. BMET Recent Labs    04/04/20 0445  NA 136  K 4.3  CL 98  CO2 26  GLUCOSE 145*  BUN 16  CREATININE 0.60  CALCIUM 8.8*   PT/INR No results for input(s): LABPROT, INR in the last 72 hours. CMP     Component Value Date/Time   NA 136 04/04/2020 0445   K 4.3 04/04/2020 0445   CL 98 04/04/2020 0445   CO2 26 04/04/2020 0445   GLUCOSE 145 (H) 04/04/2020 0445   BUN 16 04/04/2020 0445   CREATININE 0.60 04/04/2020 0445   CALCIUM 8.8 (L) 04/04/2020 0445   PROT 6.0 (L) 04/04/2020 0445   ALBUMIN 3.1 (L) 04/04/2020 0445   AST 87 (H) 04/04/2020 0445   ALT 89 (H) 04/04/2020 0445   ALKPHOS 159 (H) 04/04/2020 0445   BILITOT 0.8 04/04/2020 0445   GFRNONAA >60 04/04/2020 0445   GFRAA >60 01/25/2020  0308   Lipase     Component Value Date/Time   LIPASE 22 01/23/2017 1922    Anti-infectives: Anti-infectives (From admission, onward)   Start     Dose/Rate Route Frequency Ordered Stop   03/19/20 1000  metroNIDAZOLE (FLAGYL) tablet 500 mg  Status:  Discontinued        500 mg Per Tube 2 times daily 03/19/20 0822 03/23/20 0827   03/18/20 1200  amoxicillin (AMOXIL) 250 MG/5ML suspension 500 mg  Status:  Discontinued        500 mg Per Tube Every 8 hours 03/18/20 0915 03/23/20 0559   03/17/20 1200  cefTRIAXone (ROCEPHIN) 1 g in sodium chloride 0.9 % 100 mL IVPB  Status:  Discontinued        1 g 200 mL/hr over 30 Minutes Intravenous Every 24 hours 03/17/20 1048 03/18/20 0913   03/16/20 1130  metroNIDAZOLE (FLAGYL) 50 mg/ml oral suspension 500 mg  Status:  Discontinued        500 mg Per Tube 2 times daily 03/16/20 1040 03/19/20 0821   03/16/20 1130  fluconazole (DIFLUCAN) 40 MG/ML suspension 152 mg        150 mg Per Tube  Once 03/16/20 1040 03/16/20 1245   02/11/20 1526  ceFAZolin (ANCEF) IVPB 2g/100 mL premix        2 g 200 mL/hr  over 30 Minutes Intravenous  Once 02/11/20 1526 02/11/20 1641   02/05/20 0600  ceFAZolin (ANCEF) IVPB 2g/100 mL premix        2 g 200 mL/hr over 30 Minutes Intravenous To Short Stay 02/04/20 1054 02/05/20 0950   01/29/20 0600  ceFAZolin (ANCEF) IVPB 2g/100 mL premix        2 g 200 mL/hr over 30 Minutes Intravenous On call to O.R. 01/28/20 1511 01/30/20 0559   01/28/20 2000  cefTRIAXone (ROCEPHIN) 1 g in sodium chloride 0.9 % 100 mL IVPB        1 g 200 mL/hr over 30 Minutes Intravenous Every 24 hours 01/28/20 1925 02/02/20 0724   01/06/20 0900  cefTRIAXone (ROCEPHIN) 2 g in sodium chloride 0.9 % 100 mL IVPB  Status:  Discontinued        2 g 200 mL/hr over 30 Minutes Intravenous Every 12 hours 01/06/20 0105 01/06/20 2202   01/06/20 0800  vancomycin (VANCOCIN) IVPB 1000 mg/200 mL premix  Status:  Discontinued       "Followed by" Linked Group Details   1,000  mg 200 mL/hr over 60 Minutes Intravenous Every 12 hours 01/05/20 1904 01/06/20 2202   01/05/20 2000  vancomycin (VANCOREADY) IVPB 1500 mg/300 mL       "Followed by" Linked Group Details   1,500 mg 150 mL/hr over 120 Minutes Intravenous  Once 01/05/20 1904 01/06/20 0127   01/05/20 1830  cefTRIAXone (ROCEPHIN) 2 g in sodium chloride 0.9 % 100 mL IVPB        2 g 200 mL/hr over 30 Minutes Intravenous  Once 01/05/20 1829 01/05/20 2220     Assessment/Plan Hx polysubstance abuse Hepatitis C Acute urinary retention/UTI Yeast vaginitis Mild Malnutrition- Prealbumin 28.2 (10/7) BMI 27.4  Dysphagia Acute persistent metabolic encephalopathy in the context of status epilepticus - s/p lap G-tube 10/15 by Dr. Andrey Campanile - patient pulled on G-tube 10/18 - studies showed tube outside of stomach but stomach still pexied up to abdominal wall. Replaced by IR  Hemorrhoids  - improved after recent course of proctofoam ordered by primary team, there are no signs of inflammation or bleeding. No acute surgical needs. - would recommended addition of metamucil or psyllium to encourage regular bowel function and prevent constipation. I will order this.  - general surgery will sign off, please call as needed.    LOS: 89 days    Hosie Spangle, [facility] Please see Amion for pager number during day hours 7:00am-4:30pm

## 2020-04-04 NOTE — Progress Notes (Signed)
Pt wanted to walk and talk outside on the hallway. Pt walked around the nurses station, pt then asked for coffee and if she could talk to the people there. Pt was allowed per Chemical engineer at nurses station. Pt smiled and occasionally spoke a couple of words to people. Pt has been nonverbal or would have small outbursts of crying, but no full sentences for most of the morning. Pt now back in her room eating her snacks and drinking her coffee. Will continue to monitor pt.

## 2020-04-04 NOTE — TOC Progression Note (Signed)
Transition of Care [facility]) - Progression Note    Patient Details  Name: Anita Davis MRN: [id number] Date of Birth: [birth year]  [facility] CM/SW Contact  Kermit Balo, RN Phone Number: 04/04/2020, 3:54 PM  Clinical Narrative:    CM spoke to admissions at [facility] and they are going to look at the referral again. CM has sent them updates and new information.  TOC following.   Expected Discharge Plan: Skilled Nursing Facility Barriers to Discharge: Continued Medical Work up,Inadequate or no insurance,Active Substance Use - Placement,SNF Pending payor source - LOG,SNF Pending Medicaid,SNF Pending bed offer  Expected Discharge Plan and Services Expected Discharge Plan: Skilled Nursing Facility In-house Referral: Artist   Post Acute Care Choice: Skilled Nursing Facility Living arrangements for the past 2 months: Mobile Home                                       Social Determinants of Health (SDOH) Interventions    Readmission Risk Interventions No flowsheet data found.

## 2020-04-04 NOTE — Progress Notes (Signed)
Pt has 2 visitors visiting pt now. Pt was taking a nap, and was awoken when they arrived. Pt sitting up, eating food given by visitors and conversing with them. Will continue to monitor pt.

## 2020-04-04 NOTE — Progress Notes (Signed)
Pt was verbal and talking to this writer for most of the afternoon after taking her medicine and controlling her pain. As soon as the pain on the pt.s back had returned, the pt went back to being mute and would only nod or shake her head. Pt began crying about pain, this writer applied some pain relief lotion and 2 ice packs on her back. Day shift RN and night shift RN have both been notified. Will continue to monitor pt.

## 2020-04-04 NOTE — Progress Notes (Addendum)
Pt was turning around in circles on the bed, and NP had stated that pt does so when in pain. This writer offered warm packets, but due to back orders, ice pack was applied onto her back. RN notified of pt.s pain. Now currently awaiting on medication. Will continue to monitor pt.

## 2020-04-04 NOTE — Progress Notes (Signed)
Pt was well -behaved last night/. Stating wants to spend Chrismas  With her chi1dren at home.

## 2020-04-04 NOTE — Progress Notes (Signed)
Nutrition Follow-up  DOCUMENTATION CODES:   Not applicable  INTERVENTION:   Continue mvi with minerals daily  Continue Ensure Enlive/Plus po TID, each supplement provides 350 kcal and 20 grams of protein (Ensure Plus has 13 grams protein; Ensure Enlive has 20 grams; Ensure Shiela Mayer has been unavailable)  Continue Magic cup TID with meals, each supplement provides 290 kcal and 9 grams of protein  May continue 73ml Prosource TF TID via PEG given pt's mentation is still variable and pt intermittently refusing po medications and not doing well with meals consistently. Each 23ml dose provides 40 kcals and 11 grams of protein. Will not transition pt to oral Prosource at this time due to concerns for pt refusing supplement  Staff should continue to assist pt with meals/supplements and encourage adequate po intake   NUTRITION DIAGNOSIS:   Inadequate oral intake related to lethargy/confusion as evidenced by meal completion < 25%.  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 PMH includes hepatitis and polysubstance abuse.   9/24 Cortrak placed (gastric) 10/10 Cortrak dislodged, TF held 10/11 Cortrak advanced (tip in proximal duodenum per KUB), TF restarted 10/14 TF reduced to 12ml/hr due to colonic ileus 10/15 G-tube placement 10/16 TF resumed 10/18 TF rate returned to 78ml/hr; G-tube pulled out 1 inch so TF held 10/21 G-tube replaced 10/27 diet advanced to regular  Pt noted to have improvement in behaviors and intake when family is around. MD is recommending that pt is placed in a facility that will allow her family to visit frequently to avoid  regressing in clinical status and worsening of behavior.    Pt's PO intake remains quite varied. Pt occasionally eating majority of meals while other times only eats a fruit cup/ice cream and a few bites. Last 8 meal completions documented as 0-80% (34% average meal intake). Pt is still receiving Ensure Enlive po TID and Magic Cup po TID which she typically does well with per RN. Pt also continues to receive 89ml Prosource TF  And free water Q6H (per MD) via her PEG. Will continue with current nutrition plan of care.  UOP: 5x unmeasured occurrences x 24 hours  Admit wt: 74.8 kg Current wt: 70.9 kg  Labs reviewed.  Medications: mvi with minerals, metamucil, vitamin B6, thiamine  Diet Order:   Diet Order            Diet regular Room service appropriate? Yes; Fluid consistency: Thin  Diet effective now                 EDUCATION NEEDS:   No education needs have been identified at this time  Skin:  Skin Assessment: Reviewed RN Assessment Skin Integrity Issues:: Incisions Incisions: abdomen  Last BM:  04/04/20  Height:   Ht Readings from Last 1 Encounters:  01/06/20 5\' 5"  (1.651 m)    Weight:   Wt Readings from Last 1 Encounters:  04/04/20 70.9 kg   BMI:  Body mass index is 26.01 kg/m.  Estimated Nutritional Needs:   Kcal:  1800-2000  Protein:  90-100 grams  Fluid:  >/=1.8L/d    04/06/20, MS, RD, LDN RD pager number and weekend/on-call pager number located in [place].

## 2020-04-05 ENCOUNTER — Inpatient Hospital Stay (HOSPITAL_COMMUNITY): Payer: Medicaid Other

## 2020-04-05 DIAGNOSIS — F339 Major depressive disorder, recurrent, unspecified: Secondary | ICD-10-CM

## 2020-04-05 LAB — URINALYSIS, ROUTINE W REFLEX MICROSCOPIC
Bilirubin Urine: NEGATIVE
Glucose, UA: NEGATIVE mg/dL
Hgb urine dipstick: NEGATIVE
Ketones, ur: NEGATIVE mg/dL
Nitrite: POSITIVE — AB
Protein, ur: NEGATIVE mg/dL
Specific Gravity, Urine: 1.006 (ref 1.005–1.030)
WBC, UA: >50 WBC/hpf — ABNORMAL HIGH (ref 0–5)
pH: 7 (ref 5.0–8.0)

## 2020-04-05 MED ORDER — CYCLOBENZAPRINE HCL 5 MG PO TABS
7.5000 mg | ORAL_TABLET | Freq: Three times a day (TID) | ORAL | Status: DC
  Administered 2020-04-05 – 2020-04-16 (×34): 7.5 mg via ORAL
  Filled 2020-04-05 (×5): qty 1.5
  Filled 2020-04-05: qty 2
  Filled 2020-04-05 (×4): qty 1.5
  Filled 2020-04-05 (×3): qty 2
  Filled 2020-04-05 (×3): qty 1.5
  Filled 2020-04-05: qty 2
  Filled 2020-04-05 (×2): qty 1.5
  Filled 2020-04-05: qty 2
  Filled 2020-04-05 (×2): qty 1.5
  Filled 2020-04-05 (×3): qty 2
  Filled 2020-04-05: qty 1.5
  Filled 2020-04-05 (×2): qty 2
  Filled 2020-04-05: qty 1.5
  Filled 2020-04-05: qty 2
  Filled 2020-04-05: qty 1.5
  Filled 2020-04-05: qty 2
  Filled 2020-04-05 (×4): qty 1.5

## 2020-04-05 MED ORDER — LEVOFLOXACIN 500 MG PO TABS
500.0000 mg | ORAL_TABLET | Freq: Every day | ORAL | Status: AC
  Administered 2020-04-05 – 2020-04-11 (×7): 500 mg via ORAL
  Filled 2020-04-05 (×7): qty 1

## 2020-04-05 NOTE — Plan of Care (Signed)
  Problem: Clinical Measurements: Goal: Will remain free from infection Outcome: Progressing   Problem: Clinical Measurements: Goal: Ability to maintain clinical measurements within normal limits will improve Outcome: Progressing   Problem: Health Behavior/Discharge Planning: Goal: Ability to manage health-related needs will improve Outcome: Progressing   Problem: Education: Goal: Knowledge of General Education information will improve Description: Including pain rating scale, medication(s)/side effects and non-pharmacologic comfort measures Outcome: Progressing   

## 2020-04-05 NOTE — Progress Notes (Signed)
Occupational Therapy Treatment [name] MRN: [id number] DOB: 22-Apr-1986 Today's Date: 04/05/2020    History of present illness 34 year old with past medical history significant for hepatitis C, polysubstance abuse brought to the hospital 9/13 for disorientation which was suspected to be substance abuse.  After she became more lucid, she was discharged, but then police brought her back later in the day and they found her passed out in a parking lot.  Urine drug screen was positive for benzodiazepine and THC. An EEG done on 9/15 showed patient was in status epilepticus.  Patient was a started on Keppra.  Despite these, EEG noted continued seizures requiring additional medications as well.  Finally on 9/18, seizures broke. Follow-up EEG on 9/20 noted evidence of epilepticity from the left central temporal region.  Repeat EEG done on 9/22 noted epileptogenicity from the left central temporal region.  Pt with PEG placed on 02/05/20.    OT comments  Pt more withdrawn today and very upset regarding children. Pt writing that children are being adopted. Uncertain if this information is accurate but at this time its very real for the patient. Pt was able to navigate away from room with two turns and navigate back to room supervision which is improvement. Pt writing messages to therapist to not alert the sitter to her thoughts. Pt writing is also an improvement in trying to communicate her feelings and needs. Visits from the chaplain might benefit the patient as she is sitting reading a bible book written in an elementary level of reading.    Follow Up Recommendations  SNF    Equipment Recommendations  3 in 1 bedside commode    Recommendations for Other Services      Precautions / Restrictions Precautions Precautions: Fall Precaution Comments: seizure Restrictions Weight Bearing Restrictions: No       Mobility Bed Mobility Overal bed mobility: Modified Independent Bed  Mobility: Supine to Sit;Sit to Supine     Supine to sit: Supervision Sit to supine: Supervision   General bed mobility comments: pt quick to move but steady  Transfers Overall transfer level: Modified independent Equipment used: None Transfers: Sit to/from Stand Sit to Stand: Supervision         General transfer comment: quick to move but steady    Balance Overall balance assessment: Needs assistance Sitting-balance support: No upper extremity supported;Feet supported Sitting balance-Leahy Scale: Good     Standing balance support: No upper extremity supported;During functional activity Standing balance-Leahy Scale: Fair                             ADL either performed or assessed with clinical judgement   ADL   Eating/Feeding: Modified independent Eating/Feeding Details (indicate cue type and reason): opening an apple sauce container during session and self feeding                 Lower Body Dressing: Modified independent Lower Body Dressing Details (indicate cue type and reason): putting on slippers             Functional mobility during ADLs: Supervision/safety General ADL Comments: pt reading directions and making it to #3 in the 5 steps and then states "i need to go back to my room" pt had stopped twice and looked back toward room in a questioning concerned way. pt showing some anxious facial expression and was able to locate room this time. pt demonstrates improved pathfinding though she did not  complete the full sessions intended     Vision       Perception     Praxis      Cognition Arousal/Alertness: Awake/alert Behavior During Therapy: Flat affect Overall Cognitive Status: Impaired/Different from baseline Area of Impairment: Safety/judgement                         Safety/Judgement: Decreased awareness of safety     General Comments: pt very withdrawn this session. pt recognized therapist from prior session. pt  agreeable to session after recognition. pt reading 5 step pathfinding task and then asking for pen. Pt writing a narrative regarding kids being adopted and tearful while writing the information. pt writes "I missed alot but i feel they are beter of without me" "no matter what they say i will always love my kids"        Exercises     Shoulder Instructions       General Comments VSS on RA    Pertinent Vitals/ Pain       Pain Assessment: Faces Faces Pain Scale: Hurts little more Pain Location: back generalized Pain Descriptors / Indicators: Discomfort  Home Living                                          Prior Functioning/Environment              Frequency  Min 1X/week        Progress Toward Goals  OT Goals(current goals can now be found in the care plan section)  Progress towards OT goals: Progressing toward goals  Acute Rehab OT Goals Patient Stated Goal: to read child like bible in the room OT Goal Formulation: With patient Time For Goal Achievement: 04/15/20 Potential to Achieve Goals: Good ADL Goals Pt Will Perform Grooming: with modified independence;standing Additional ADL Goal #1: Pt will folow 5 step pathfinding task with written instructions min (A) Additional ADL Goal #2: pt will complete full adl at sink level supervision level Additional ADL Goal #3: pt will gather all adls items in room needed to complete bath mod i  Plan Discharge plan remains appropriate;Frequency needs to be updated    Co-evaluation                 AM-PAC OT "6 Clicks" Daily Activity     Outcome Measure   Help from another person eating meals?: None Help from another person taking care of personal grooming?: A Little Help from another person toileting, which includes using toliet, bedpan, or urinal?: A Little Help from another person bathing (including washing, rinsing, drying)?: A Little Help from another person to put on and taking off regular upper  body clothing?: A Little Help from another person to put on and taking off regular lower body clothing?: A Little 6 Click Score: 19    End of Session    OT Visit Diagnosis: Unsteadiness on feet (R26.81);Muscle weakness (generalized) (M62.81);Pain;Other symptoms and signs involving the nervous system (R29.898);Other symptoms and signs involving cognitive function;Cognitive communication deficit (R41.841);Ataxia, unspecified (R27.0);Feeding difficulties (R63.3);Other abnormalities of gait and mobility (R26.89)   Activity Tolerance Patient tolerated treatment well   Patient Left in bed;with call bell/phone within reach;with nursing/sitter in room (sitter present)   Nurse Communication Mobility status;Precautions        Time: 0630-1601 OT Time Calculation (min): 18 min  Charges: OT General Charges $OT Visit: 1 Visit OT Treatments $Therapeutic Activity: 8-22 mins   Brynn, OTR/L  Acute Rehabilitation Services Pager: [phone] Office: [phone] .    Mateo Flow 04/05/2020, 2:39 PM

## 2020-04-05 NOTE — TOC Progression Note (Signed)
Transition of Care [facility]) - Progression Note    Patient Details  Name: Anita Davis MRN: [id number] Date of Birth: [birth year]  Transition of [facility]) CM/SW Contact  Lorri Frederick, LCSW Phone Number: 04/05/2020, 11:14 AM  Clinical Narrative:   Fax to [facility] not successful after multiple attempts, CSW spoke with Judeth Cornfield at [employer] [phone]) who asked that referral be sent to her and she will forward on to [place].  Fax sent and was successful.     Expected Discharge Plan: Skilled Nursing Facility Barriers to Discharge: Continued Medical Work up,Inadequate or no insurance,Active Substance Use - Placement,SNF Pending payor source - LOG,SNF Pending Medicaid,SNF Pending bed offer  Expected Discharge Plan and Services Expected Discharge Plan: Skilled Nursing Facility In-house Referral: Artist   Post Acute Care Choice: Skilled Nursing Facility Living arrangements for the past 2 months: Mobile Home                                       Social Determinants of Health (SDOH) Interventions    Readmission Risk Interventions No flowsheet data found.

## 2020-04-05 NOTE — Progress Notes (Addendum)
[facility] PROGRESS NOTE  Anita Davis NFA:[id number] DOB: 03-Jan-1986 DOA: 01/04/2020 PCP: [name], PA-C            12/8     Status: Remains inpatient appropriate because:Altered mental status and Unsafe d/c plan  Dispo:  The patient is from: Home              Anticipated d/c is to: SNF vs home with family.  Extensive conversation had with patient's stepmother [name].  She has been in contact with a local nursing facility in [facility] that apparently accept patients with neuro psychiatric issues.  It is best for patient if we are able to locate a facility that is close to family so they can visit her daily              Anticipated d/c date is: > 3 days              Patient currently is medically stable to d/c.  Barrier to discharge:SNF placement at a facility that can accommodate behavioral issues similar to dementia patients, Medicaid and disability applications in process.  TOC given contact information for above-stated nursing facility in [facility] Recommendation is to place patient close to family to ensure continued improvement   Code Status: Full Family Communication: Stepmother [name] on 12/13 as well as patient's father on separate call. DVT prophylaxis: Lovenox Vaccination status: Family confirmed initial Pfizer Covid vaccine dose given in June, second dose given on 11/2  Foley catheter: No  HPI: 34 year old female with known history of substance abuse on prior methadone, active hepatitis C and gestational diabetes who presented initially with confusion.  She was initially admitted to [facility] in September with a diagnosis of encephalopathy presumed secondary to drug overdose.  Subsequent work-up inconclusive.  LP without signs of infection, MRI brain unremarkable and unfortunately she remained encephalopathic.  EEG did reveal focal seizures.  AED therapy was initiated and seizures were suppressed and she was transferred to [facility] for continuous  EEG.  Apparently had episodes of status epilelepticus before full suppression of seizures with medications.  Subsequently neurological recovery was poor and delayed.  Neurology suspicious for autoimmune encephalitis and was given high-dose steroids with equivocal improvement but deteriorated after steroids completed.  Was given IVIG without any significant improvement in mentation.  Since that time patient has had waxing and waning mentation ranging from unresponsive to alert confused and verbal and at times restless and agitated.  When she is awake/alert she is able to ambulate but requires assistance due to impulsivity and gait instability.  Unfortunately these episodes of wakefulness are never sustained.  It is suspected that patient has a chronic sequela from her encephalopathy and seizures therefore long-term SNF has been recommended.  He is inconsistent with oral intake and continues to require tube feedings for nutrition and hydration.  Subjective: Awake and continue to report back pain. Limited verbal communication or eye contact today. Eating breakfast. During exam decided to lay down in bed. Patient encouraged regarding her clinical improvement. Of note, patient had written down extensive thoughts in a notebook regarding depression over current circumstances as well as her prior decision to relinquish parental rights to both of her children. During time of consoling patient she did cry briefly.  Objective: Vitals:   04/05/20 0316 04/05/20 0832  BP: (!) 155/64 118/81  Pulse: 64 84  Resp: 15 18  Temp: 98.9 F (37.2 C)   SpO2: 98% 100%    Intake/Output Summary (Last 24  hours) at 04/05/2020 1139 Last data filed at 04/05/2020 1021 Gross per 24 hour  Intake 1850 ml  Output --  Net 1850 ml   Filed Weights   03/29/20 0500 04/04/20 [id number] 04/05/20 [id number]  Weight: 72.5 kg 70.9 kg 71.6 kg    Exam: General: Awake, calm but appears sad Pulmonary: Bilateral lung sounds clear to auscultation  anteriorly, no increased work of breathing, room air Cardiac: S1-S2, no peripheral edema, no JVD, no tachycardia Abdomen: Soft, nontender nondistended, PEG tube in place and clamped Neurological: Moves all extremities x4, no focal neurological deficits no tremors Psychiatric: Awake with extremely sad affect. Cries when children are mentioned. 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  Dilantin level repeated 12/13 and has increased from 3.0 to 7.5 -No response to Symmetrel Symmetrel or Provigil. Zoloft and Abilify contributed to ongoing lethargy so tapered and discontinued.  Abilify also caused dystonic movements.  No benzodiazepines due to worsening of agitation -Continue naltrexone for hyper sexualization (patient with history of PTSD and narcotic abuse therefore prior to admission). -Symptoms worsened on Zyprexa so discontinued.   -Seroquel resumed on 12/9 and on 12/12 am dose increased to 200 mg and pm dose increased to 400 mg. -Continue Tegretol 200 mg twice daily behavioral disturbances, agitation and impulsivity recommended by psychiatry. -Continue low-dose scheduled Vistaril to help with impulsivity as well -Continue one-to-one safety sitter because of impulsivity increased fall risk include wandering behaviors -Patient improvement and behaviors markedly improved with consistent interaction with family.  Recommendation for SNF placement should include placing patient in facility close to family to avoid regression in clinical status and worsening of behavior D/w  Pam/CIR- rec having [facility] fu [facility] if able to be dc'd home  Back pain/abnormal urinalysis -Patient with low-grade fever overnight and is now having back pain for days not responsive to NSAIDs and muscle relaxers -DG lumbar spine unremarkable -During hospitalization patient has been treated for pansensitive E. coli UTI as well as Salmonella UTI both of which have been sensitive to Levaquin -We will initiate Levaquin to treat for 7 to 10 days given history of recurrent UTI since admission.  Bedside nurse made aware needs to ensure culture has been collected prior to initiating antibiotics  Hyperglycemia secondary to medication -Resolved -Hemoglobin A1c 5.8 consistent with prediabetes -Antipsychotics such as Seroquel and Zyprexa can cause hyperglycemia and drug-related diabetes mellitus we will continue to follow closely; patient has been having increased thirst and increased urination  Physical deconditioning -More consistent participation with PT and OT. -Continues to require SNF level of care due to safety issues and impulsivity but are hopeful can manage behavior enough that patient can return home with family  Acute on chronic depression prior to admission/known polysubstance abuse/suicidal ideation -Attempted to maximize depression therapy but seem to make patient's mentation worse and did not improve symptoms therefore Zoloft 100 mg and Abilify 2 mg discontinued after being tapered -Patient with behaviors as well as written statements concerning for worsening depression as well as suicidal ideation although suspected this is primarily related to her brain injury -Psychiatric team updated on patient's utilization of notebook as a method of more  effective communication. They will see patient today on 12/14.  Large hemorrhoids. -Markedly improved-LD Proctofoam 12/15  Dysphagia -Continue regular diet -Given behavioral issues we will leave PEG tube in place for medications  until patient consistently takes medications by mouth   Nutrition Status: Nutrition Problem: Inadequate oral intake Etiology: lethargy/confusion Signs/Symptoms: meal completion for the most part is between 50 and 100% Interventions: Tube feeding,Prostat-12/6 tube feeding discontinued in favor of regular diet, prostat supplement and Ensure supplements      Other problems: Nonobstructive transaminitis in context of hepatitis C -Elevated HCV antibody with markedly elevated HCV RNA quantitative level  consistent with chronic hepatitis C  -None total bilirubin normal with slightly elevated alkaline phosphatase with AST 165 and ALT 274 -11/10: Discussed with ID Dr. Manson Passey.  Treatment of hepatitis C typically is initiated in the outpatient setting.  Medications can be quite expensive and usually take time to obtain insurance approval.  Also since patient likely go to skilled nursing facility expensive medication could be barrier to admission. -ID recommends scheduling outpatient appointment their clinic closer to discharge date  ? Breast enlargement/gynecomastia -No clinical evidence of breast enlargement -Prolactin level low with normal FSH -Appearance of breast enlargement reported by family likely related to improve nutrition as well as abdominal binder moving up underneath breasts and malpositioning of breasts.  Diarrhea>>>Constipation Resolved Felt secondary to laxatives but given Salmonella UTI could have been related to Salmonella although at the time diarrhea was being worked up patient had no leukocytosis, no abdominal pain and no fevers.  Suspected oral Candida -Resolved after oral nystatin  Low back pain -Resolved -Completed 5 days of ibuprofen -Continue Voltaren gel and add as needed Flexeril (will DC Robaxin since there is to be ineffective)  Salmonella UTI -Completed amoxicillin    Data Reviewed: Basic Metabolic Panel: Recent Labs  Lab 04/01/20 0349  04/04/20 0445  NA 137 136  K 4.0 4.3  CL 100 98  CO2 27 26  GLUCOSE 129* 145*  BUN 11 16  CREATININE 0.51 0.60  CALCIUM 9.1 8.8*  MG  --  2.0   Liver Function Tests: Recent Labs  Lab 04/01/20 0349 04/04/20 0445  AST 97* 87*  ALT 127* 89*  ALKPHOS 181* 159*  BILITOT 0.5 0.8  PROT 6.2* 6.0*  ALBUMIN 3.3* 3.1*   No results for input(s): LIPASE, AMYLASE in the last 168 hours. No results for input(s): AMMONIA in the last 168 hours. CBC: Recent Labs  Lab 04/01/20 0349  WBC 5.4  HGB 12.7  HCT 38.7  MCV 96.5  PLT 348   Cardiac Enzymes: No results for input(s): CKTOTAL, CKMB, CKMBINDEX, TROPONINI in the last 168 hours. BNP (last 3 results) No results for input(s): BNP in the last 8760 hours.  ProBNP (last 3 results) No results for input(s): PROBNP in the last 8760 hours.  CBG: No results for input(s): GLUCAP in the last 168 hours.  No results found for this or any previous visit (from the past 240 hour(s)).   Studies: DG Lumbar Spine 2-3 Views  Result Date: 04/05/2020 CLINICAL DATA:  Low back pain EXAM: LUMBAR SPINE - 2-3 VIEW COMPARISON:  None. FINDINGS: Frontal, lateral, and spot lumbosacral lateral images were obtained. There are 5 non-rib-bearing lumbar type vertebral bodies. No appreciable fracture or spondylolisthesis. Disc spaces appear normal. No erosive change. Gastrostomy catheter in region of stomach. IMPRESSION: No fracture or spondylolisthesis. No appreciable underlying arthropathic change. Electronically Signed   By: Bretta Bang III M.D.   On: 04/05/2020  09:08    Scheduled Meds: . carbamazepine  200 mg Oral BID  . Chlorhexidine Gluconate Cloth  6 each Topical Daily  . cyclobenzaprine  7.5 mg Oral TID  . diclofenac Sodium  4 g Topical QID  . enoxaparin (LOVENOX) injection  40 mg Subcutaneous Q24H  . feeding supplement  237 mL Oral TID BM  . feeding supplement (PROSource TF)  45 mL Per Tube TID  . free water  400 mL Per Tube Q6H  . Gerhardt's  butt cream  1 application Topical TID  . hydrocortisone-pramoxine  1 applicator Rectal BID  . hydrOXYzine  10 mg Oral TID  . lacosamide  200 mg Oral BID  . multivitamin with minerals  1 tablet Oral Daily  . naltrexone  50 mg Oral Daily  . phenobarbital  64.8 mg Oral BID  . phenytoin  200 mg Oral BID  . psyllium  1 packet Oral BID  . vitamin B-6  100 mg Oral Daily  . QUEtiapine  200 mg Oral q morning - 10a  . QUEtiapine  400 mg Oral QHS  . thiamine  100 mg Oral Daily   Continuous Infusions: . sodium chloride      Principal Problem:   Refractory seizure (HCC) Active Problems:   Acute metabolic encephalopathy   Hypokalemia   Polysubstance abuse (HCC)   Elevated CK   Transaminitis   Chronic hepatitis C without hepatic coma (HCC)   Distended abdomen   Palliative care encounter   Colonic Ileus (HCC)   Organic brain syndrome   On enteral nutrition   Physical deconditioning   Protein-calorie malnutrition (HCC)   Inadequate oral nutritional intake   Impulse disorder   Consultants:  Neurology  Psychiatry  Interventional radiology  Surgery  Procedures:  9/14 lumbar puncture  9/15 EEG  9/16 EEG  9/17 EEG  9/19 overnight EEG with video  9/22 overnight EEG with video with discontinuation of long-term EEG monitoring on 9/25  9/24 core track placement  10/6 EEG  Antibiotics: Anti-infectives (From admission, onward)   Start     Dose/Rate Route Frequency Ordered Stop   03/19/20 1000  metroNIDAZOLE (FLAGYL) tablet 500 mg  Status:  Discontinued        500 mg Per Tube 2 times daily 03/19/20 0822 03/23/20 0827   03/18/20 1200  amoxicillin (AMOXIL) 250 MG/5ML suspension 500 mg  Status:  Discontinued        500 mg Per Tube Every 8 hours 03/18/20 0915 03/23/20 0559   03/17/20 1200  cefTRIAXone (ROCEPHIN) 1 g in sodium chloride 0.9 % 100 mL IVPB  Status:  Discontinued        1 g 200 mL/hr over 30 Minutes Intravenous Every 24 hours 03/17/20 1048 03/18/20 0913    03/16/20 1130  metroNIDAZOLE (FLAGYL) 50 mg/ml oral suspension 500 mg  Status:  Discontinued        500 mg Per Tube 2 times daily 03/16/20 1040 03/19/20 0821   03/16/20 1130  fluconazole (DIFLUCAN) 40 MG/ML suspension 152 mg        150 mg Per Tube  Once 03/16/20 1040 03/16/20 1245   02/11/20 1526  ceFAZolin (ANCEF) IVPB 2g/100 mL premix        2 g 200 mL/hr over 30 Minutes Intravenous  Once 02/11/20 1526 02/11/20 1641   02/05/20 0600  ceFAZolin (ANCEF) IVPB 2g/100 mL premix        2 g 200 mL/hr over 30 Minutes Intravenous To Short Stay 02/04/20 1054 02/05/20 0950  01/29/20 0600  ceFAZolin (ANCEF) IVPB 2g/100 mL premix        2 g 200 mL/hr over 30 Minutes Intravenous On call to O.R. 01/28/20 1511 01/30/20 0559   01/28/20 2000  cefTRIAXone (ROCEPHIN) 1 g in sodium chloride 0.9 % 100 mL IVPB        1 g 200 mL/hr over 30 Minutes Intravenous Every 24 hours 01/28/20 1925 02/02/20 0724   01/06/20 0900  cefTRIAXone (ROCEPHIN) 2 g in sodium chloride 0.9 % 100 mL IVPB  Status:  Discontinued        2 g 200 mL/hr over 30 Minutes Intravenous Every 12 hours 01/06/20 0105 01/06/20 2202   01/06/20 0800  vancomycin (VANCOCIN) IVPB 1000 mg/200 mL premix  Status:  Discontinued       "Followed by" Linked Group Details   1,000 mg 200 mL/hr over 60 Minutes Intravenous Every 12 hours 01/05/20 1904 01/06/20 2202   01/05/20 2000  vancomycin (VANCOREADY) IVPB 1500 mg/300 mL       "Followed by" Linked Group Details   1,500 mg 150 mL/hr over 120 Minutes Intravenous  Once 01/05/20 1904 01/06/20 0127   01/05/20 1830  cefTRIAXone (ROCEPHIN) 2 g in sodium chloride 0.9 % 100 mL IVPB        2 g 200 mL/hr over 30 Minutes Intravenous  Once 01/05/20 1829 01/05/20 2220       Time spent: 20 minutes    [name] ANP  [facility] Pager [phone].  04/05/2020, 11:39 AM  LOS: 90 days
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
## 2020-04-05 NOTE — Progress Notes (Signed)
Pt was more cheerful las night and carried out  Lots of conversation with Nsg staff,  compare to two nights go as pt was  Very agitated depressed and despair and tied mask string around her neck stating  No body likes me and I am not going to get better.  Nurse has to use a scissors to cut off  the mask string around  her neck which was so tight. Pt denied harming self last light and was more engaged with Nursing staff.
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
## 2020-04-06 ENCOUNTER — Inpatient Hospital Stay (HOSPITAL_COMMUNITY): Payer: Medicaid Other

## 2020-04-06 DIAGNOSIS — T43691A Poisoning by other psychostimulants, accidental (unintentional), initial encounter: Secondary | ICD-10-CM | POA: Diagnosis not present

## 2020-04-06 DIAGNOSIS — Z20822 Contact with and (suspected) exposure to covid-19: Secondary | ICD-10-CM | POA: Diagnosis not present

## 2020-04-06 DIAGNOSIS — G9341 Metabolic encephalopathy: Secondary | ICD-10-CM | POA: Diagnosis not present

## 2020-04-06 DIAGNOSIS — Z66 Do not resuscitate: Secondary | ICD-10-CM | POA: Diagnosis not present

## 2020-04-06 HISTORY — PX: IR GASTROSTOMY TUBE REMOVAL: IMG5492

## 2020-04-06 MED ORDER — QUETIAPINE FUMARATE 200 MG PO TABS
200.0000 mg | ORAL_TABLET | Freq: Every day | ORAL | Status: DC
  Administered 2020-04-07 – 2020-04-10 (×4): 200 mg via ORAL
  Filled 2020-04-06 (×5): qty 1

## 2020-04-06 MED ORDER — LIDOCAINE VISCOUS HCL 2 % MT SOLN
OROMUCOSAL | Status: AC
  Filled 2020-04-06: qty 15

## 2020-04-06 MED ORDER — LACOSAMIDE 50 MG PO TABS: 100.0000 mg | ORAL_TABLET | Freq: Two times a day (BID) | ORAL | Status: DC

## 2020-04-06 MED ORDER — LACOSAMIDE 50 MG PO TABS
50.0000 mg | ORAL_TABLET | Freq: Two times a day (BID) | ORAL | Status: AC
  Administered 2020-04-09 – 2020-04-11 (×6): 50 mg via ORAL
  Filled 2020-04-06 (×6): qty 1

## 2020-04-06 MED ORDER — QUETIAPINE FUMARATE 100 MG PO TABS
100.0000 mg | ORAL_TABLET | ORAL | Status: DC
  Administered 2020-04-06 (×2): 100 mg via ORAL
  Filled 2020-04-06: qty 1

## 2020-04-06 MED ORDER — QUETIAPINE FUMARATE 200 MG PO TABS
200.0000 mg | ORAL_TABLET | ORAL | Status: DC
  Administered 2020-04-06: 10:00:00 200 mg via ORAL
  Filled 2020-04-06: qty 1

## 2020-04-06 MED ORDER — ZIPRASIDONE MESYLATE 20 MG IM SOLR
20.0000 mg | Freq: Once | INTRAMUSCULAR | Status: DC
  Filled 2020-04-06: qty 20

## 2020-04-06 MED ORDER — LACOSAMIDE 50 MG PO TABS
100.0000 mg | ORAL_TABLET | Freq: Two times a day (BID) | ORAL | Status: AC
  Administered 2020-04-06 – 2020-04-08 (×6): 100 mg via ORAL
  Filled 2020-04-06 (×6): qty 2

## 2020-04-06 NOTE — Progress Notes (Addendum)
[facility] PROGRESS NOTE  Anita Davis GNF:[id number] DOB: Sep 16, 1985 DOA: 01/04/2020 PCP: [name], PA-C            12/8     Status: Remains inpatient appropriate because:Altered mental status and Unsafe d/c plan  Dispo:  The patient is from: Home              Anticipated d/c is to: SNF vs home with family.  Extensive conversation had with patient's stepmother [name].  She has been in contact with a local nursing facility in [facility] that apparently accept patients with neuro psychiatric issues.  It is best for patient if we are able to locate a facility that is close to family so they can visit her daily              Anticipated d/c date is: > 3 days              Patient currently is medically stable to d/c.  Barrier to discharge:SNF placement at a facility that can accommodate behavioral issues similar to dementia patients, Medicaid and disability applications in process.  TOC given contact information for above-stated nursing facility in [facility]. Recommendation is to place patient close to family to ensure continued improvement   Code Status: Full Family Communication: Stepmother [name] on 12/15 DVT prophylaxis: 12/15 consistently ambulatory therefore will discontinue Lovenox Vaccination status: Family confirmed initial Pfizer Covid vaccine dose given in June, second dose given on 11/2  Foley catheter: No  HPI: 34 year old female with known history of substance abuse on prior methadone, active hepatitis C and gestational diabetes who presented initially with confusion.  She was initially admitted to [facility] in September with a diagnosis of encephalopathy presumed secondary to drug overdose.  Subsequent work-up inconclusive.  LP without signs of infection, MRI brain unremarkable and unfortunately she remained encephalopathic.  EEG did reveal focal seizures.  AED therapy was initiated and seizures were suppressed and she was transferred to [facility] for  continuous EEG.  Apparently had episodes of status epilelepticus before full suppression of seizures with medications.  Subsequently neurological recovery was poor and delayed.  Neurology suspicious for autoimmune encephalitis and was given high-dose steroids with equivocal improvement but deteriorated after steroids completed.  Was given IVIG without any significant improvement in mentation.  For the past several months she has had waxing and waning mentation ranging from unresponsive to alert confused and verbal and at times restless and agitated.  When she is awake/alert she is able to ambulate but requires assistance due to impulsivity and gait instability.  Unfortunately these episodes of wakefulness were never sustained.  About 3 weeks ago the decision was made to taper and discontinue all psychotropic medications due to lack of response and altered mentation.  Once this was accomplished patient actually became very alert and agitated and over time with adjustment and psychotropic medications patient has remained awake, able to feed herself and ambulate with minimal assistance.  She has demonstrated consistent issues with impulsivity, anxiety and disinhibition including hypersexuality.  Currently symptoms are relatively managed on max dose Seroquel, Depakote, naltrexone, and Vistaril.  Remains inconsistent with verbal communication noting at times she is almost mute with severe flat affect versus being extremely appropriate and as her family describes "back to being [place] again".  Patient is able to write out her thoughts and feelings in a notebook and best communicates in this manner.  She currently is extremely depressed and questioning her own judgment regarding allowing her children  to be adopted out.  She has documented feelings of extreme low self-worth.  Palliative care and psychiatric team concurrently evaluated the patient on 12/14 continue to encourage her to suppress her thoughts and feelings in  written format.  Patient does not wish to seek out inpatient behavioral health treatment when she is medically stable but is willing to follow-up in the outpatient setting.  In regards to her mentation with status epilepticus, Dr. Melynda Ripple with neurology was contacted regarding the possibility of weaning and discontinuing antiepileptic medications since the patient has had no further seizures for greater than 2 months plan is to begin slowly tapering and discontinuing antiepileptic drugs monitoring for any reemergence of seizure activity.  Please see below regarding details.  At this juncture pending improvement in behavioral issues plan is to hopefully discharge home with family.  Currently patient is on a multitude of medications that would be very expensive out-of-pocket and Medicaid remains pending.  We are hopeful that many of her medications can be weaned and discontinued to decrease financial impact at time of discharge.  Patient is also tolerating medications and food by mouth therefore gastrostomy tube will be discontinued on 12/15  Subjective: Awake with labile emotion primarily crying and written documentation of feelings of poor self-worth.  She wrote that "I am dumb".  Emotional support given.  Attempted to update on rationale for remaining in hospital until her family feels safe taking her home in regards to her impulsivity.  Also concerns regarding multiple medications and cost of medications out-of-pocket if she were to be otherwise ready to discharge now.  Also updated patient that no longer needs feeding tube and this will be removed today.  Although she did not verbally relate she appears to be continuing to have some low back discomfort noting she is wiggling her pelvis in the bed which she has done previously in regards to back pain.  Objective: Vitals:   04/06/20 0321 04/06/20 0835  BP: 112/85   Pulse: 73 68  Resp: 18 20  Temp: 97.7 F (36.5 C) 97.9 F (36.6 C)  SpO2: 100% 95%     Intake/Output Summary (Last 24 hours) at 04/06/2020 1247 Last data filed at 04/05/2020 1527 Gross per 24 hour  Intake 300 ml  Output --  Net 300 ml   Filed Weights   03/29/20 0500 04/04/20 0632 04/05/20 0613  Weight: 72.5 kg 70.9 kg 71.6 kg    Exam: General: Awake, sad and crying. Pulmonary: Bilateral lung sounds clear to auscultation anteriorly, no increased work of breathing, room air Cardiac: S1-S2, no peripheral edema, no JVD, no tachycardia Abdomen: Soft, nontender nondistended, PEG tube in place and clamped Neurological: Moves all extremities x4, no focal neurological deficits no tremors Psychiatric: Awake with extremely sad affect.  Continues to cry when the subject of her children is brought up even with appropriate emotional support offered.   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  On 12/15 Vimpat decreased from 200 mg to 100 mg BID and on 12/17 can decrease dosage to 50 mg BID x3  days then discontinue.(See orders) -No response to Symmetrel Symmetrel or Provigil. Zoloft and Abilify contributed to ongoing lethargy so tapered and discontinued.  Abilify also caused dystonic movements.  No benzodiazepines due to worsening of agitation -Continue naltrexone for hyper sexualization (patient with history of PTSD and narcotic abuse therefore prior to admission). -Symptoms worsened on Zyprexa     -Seroquel resumed on 12/9 and on 12/15 placed on max dose 800 mg/day as follows: 200 mg 10 AM, 200 mg 5 PM, 400 mg HS; 12/15 Qtc 437 ms -Continue Tegretol 200 mg twice daily behavioral disturbances, agitation and impulsivity  -Continue low-dose scheduled Vistaril to help with impulsivity as well -Continue one-to-one safety sitter because of impulsivity increased fall risk include wandering behaviors -Patient improvement and behaviors markedly improved with consistent interaction with family.  Recommendation for SNF placement should include placing patient in facility close to family to avoid regression in clinical status and worsening of behavior D/w Pam/CIR- rec [facility] if able to be dc'd home  Back pain/abnormal urinalysis -Patient with low-grade fever overnight and is now having back pain for days not responsive to NSAIDs and muscle relaxers -DG lumbar spine unremarkable -During hospitalization patient has been treated for pansensitive E. coli UTI as well as Salmonella UTI both of which have been sensitive to Levaquin -Continue Levaquin to treat for 7 to 10 days given history of recurrent UTI since admission. -Urine culture pending  Hyperglycemia secondary to medication -Resolved -Hemoglobin A1c 5.8 consistent with prediabetes -Antipsychotics such as Seroquel and Zyprexa can cause hyperglycemia and drug-related diabetes mellitus we will continue to follow closely; patient has been having increased thirst and increased urination  Physical deconditioning -More consistent participation with PT and OT. -Continues to require SNF level of care due to safety issues and impulsivity but are hopeful can manage behavior enough that patient can return home with family  Acute on chronic depression prior to admission/known polysubstance abuse/suicidal ideation -Attempted to maximize depression therapy but seem to make patient's mentation worse and did not improve  symptoms therefore Zoloft 100 mg and Abilify 2 mg discontinued after being tapered -Patient with behaviors as well as written statements concerning for worsening depression as well as suicidal ideation although suspected this is primarily related to her brain injury -However examined by palliative medicine and psychiatry on 12/14.  Please see their notes for details.  Patient agreeable to outpatient psych follow-up.  Continues to have ongoing issues regarding feelings of low self-worth and regret over signing over her parental rights to her children.  ** 1440 Returned to room to see patient noting that mom Bonita QuinLinda in route.  Lorinda CreedMary Larach with palliative care at bedside attempting to interact with patient but unfortunately [name] continues to have ongoing issues with severe depression and continuous crying today.  Previous today she welcomed tactile touch as a form of comfort but now will not let either myself or Corrie DandyMary physically near her.  Began crying more and crumpled up paper from her notebook.  Stepmother arrived to room and arrival of stepmother did not improve patient's affect.  She also would not allow her stepmother to touch or hug her.  Patient nurse Sian updated on current status.  An order has been placed for a one-time dose of Geodon IM if behavior continues over the next 1 hour noting patient has had excellent response to this medication with previous behaviors.  Nurse also instructed that if patient spontaneously calmed with improved behavior is continue Geodon  as ordered.  QTC today on EKG within normal limits as above  Large hemorrhoids. -Markedly improved-LD Proctofoam 12/15  Dysphagia -Continue regular diet -Even when emotionally distraught consistently will take medications and food by mouth therefore will discontinue PEG tube on 12/15-interventional radiology made aware     Other problems: Nonobstructive transaminitis in context of hepatitis C -Elevated HCV antibody with markedly  elevated HCV RNA quantitative level  consistent with chronic hepatitis C  -LFTs remain stable and continue to slightly trend down without an obstructive pattern noted -11/10: Discussed with ID Dr. Manson Passey.  Treatment of hepatitis C typically is initiated in the outpatient setting.  Medications can be quite expensive and usually take time to obtain insurance approval.   -ID recommends scheduling outpatient appointment their clinic closer to discharge date  ? Breast enlargement/gynecomastia -No clinical evidence of breast enlargement -Prolactin level low with normal FSH -Appearance of breast enlargement reported by family likely related to improve nutrition as well as abdominal binder moving up underneath breasts and malpositioning of breasts.  Diarrhea>>>Constipation Resolved Felt secondary to laxatives but given Salmonella UTI could have been related to Salmonella although at the time diarrhea was being worked up patient had no leukocytosis, no abdominal pain and no fevers.  Suspected oral Candida -Resolved after oral nystatin  Low back pain -Resolved -Completed 5 days of ibuprofen -Continue Voltaren gel and add as needed Flexeril (will DC Robaxin since there is to be ineffective)  Salmonella UTI -Completed amoxicillin    Data Reviewed: Basic Metabolic Panel: Recent Labs  Lab 04/01/20 0349 04/04/20 0445  NA 137 136  K 4.0 4.3  CL 100 98  CO2 27 26  GLUCOSE 129* 145*  BUN 11 16  CREATININE 0.51 0.60  CALCIUM 9.1 8.8*  MG  --  2.0   Liver Function Tests: Recent Labs  Lab 04/01/20 0349 04/04/20 0445  AST 97* 87*  ALT 127* 89*  ALKPHOS 181* 159*  BILITOT 0.5 0.8  PROT 6.2* 6.0*  ALBUMIN 3.3* 3.1*   No results for input(s): LIPASE, AMYLASE in the last 168 hours. No results for input(s): AMMONIA in the last 168 hours. CBC: Recent Labs  Lab 04/01/20 0349  WBC 5.4  HGB 12.7  HCT 38.7  MCV 96.5  PLT 348   Cardiac Enzymes: No results for input(s):  CKTOTAL, CKMB, CKMBINDEX, TROPONINI in the last 168 hours. BNP (last 3 results) No results for input(s): BNP in the last 8760 hours.  ProBNP (last 3 results) No results for input(s): PROBNP in the last 8760 hours.  CBG: No results for input(s): GLUCAP in the last 168 hours.  No results found for this or any previous visit (from the past 240 hour(s)).   Studies: DG Lumbar Spine 2-3 Views  Result Date: 04/05/2020 CLINICAL DATA:  Low back pain EXAM: LUMBAR SPINE - 2-3 VIEW COMPARISON:  None. FINDINGS: Frontal, lateral, and spot lumbosacral lateral images were obtained. There are 5 non-rib-bearing lumbar type vertebral bodies. No appreciable fracture or spondylolisthesis. Disc spaces appear normal. No erosive change. Gastrostomy catheter in region of stomach. IMPRESSION: No fracture or spondylolisthesis. No appreciable underlying arthropathic change. Electronically Signed   By: Bretta Bang III M.D.   On: 04/05/2020 09:08    Scheduled Meds: . carbamazepine  200 mg Oral BID  . Chlorhexidine Gluconate Cloth  6 each Topical Daily  . cyclobenzaprine  7.5 mg Oral TID  . diclofenac Sodium  4 g Topical QID  . feeding supplement  237 mL Oral TID BM  .  Gerhardt's butt cream  1 application Topical TID  . hydrOXYzine  10 mg Oral TID  . lacosamide  100 mg Oral BID  . levofloxacin  500 mg Oral Daily  . multivitamin with minerals  1 tablet Oral Daily  . naltrexone  50 mg Oral Daily  . phenobarbital  64.8 mg Oral BID  . phenytoin  200 mg Oral BID  . psyllium  1 packet Oral BID  . vitamin B-6  100 mg Oral Daily  . QUEtiapine  200 mg Oral 2 times per day  . QUEtiapine  400 mg Oral QHS  . thiamine  100 mg Oral Daily   Continuous Infusions: . sodium chloride      Principal Problem:   Refractory seizure (HCC) Active Problems:   Acute metabolic encephalopathy   Hypokalemia   Polysubstance abuse (HCC)   Elevated CK   Transaminitis   Chronic hepatitis C without hepatic coma (HCC)    Distended abdomen   Palliative care encounter   Colonic Ileus (HCC)   Organic brain syndrome   On enteral nutrition   Physical deconditioning   Protein-calorie malnutrition (HCC)   Inadequate oral nutritional intake   Impulse disorder   Consultants:  Neurology  Psychiatry  Interventional radiology  Surgery  Procedures:  9/14 lumbar puncture  9/15 EEG  9/16 EEG  9/17 EEG  9/19 overnight EEG with video  9/22 overnight EEG with video with discontinuation of long-term EEG monitoring on 9/25  9/24 core track placement  10/6 EEG  Antibiotics: Anti-infectives (From admission, onward)   Start     Dose/Rate Route Frequency Ordered Stop   04/05/20 1415  levofloxacin (LEVAQUIN) tablet 500 mg        500 mg Oral Daily 04/05/20 1327     03/19/20 1000  metroNIDAZOLE (FLAGYL) tablet 500 mg  Status:  Discontinued        500 mg Per Tube 2 times daily 03/19/20 0822 03/23/20 0827   03/18/20 1200  amoxicillin (AMOXIL) 250 MG/5ML suspension 500 mg  Status:  Discontinued        500 mg Per Tube Every 8 hours 03/18/20 0915 03/23/20 0559   03/17/20 1200  cefTRIAXone (ROCEPHIN) 1 g in sodium chloride 0.9 % 100 mL IVPB  Status:  Discontinued        1 g 200 mL/hr over 30 Minutes Intravenous Every 24 hours 03/17/20 1048 03/18/20 0913   03/16/20 1130  metroNIDAZOLE (FLAGYL) 50 mg/ml oral suspension 500 mg  Status:  Discontinued        500 mg Per Tube 2 times daily 03/16/20 1040 03/19/20 0821   03/16/20 1130  fluconazole (DIFLUCAN) 40 MG/ML suspension 152 mg        150 mg Per Tube  Once 03/16/20 1040 03/16/20 1245   02/11/20 1526  ceFAZolin (ANCEF) IVPB 2g/100 mL premix        2 g 200 mL/hr over 30 Minutes Intravenous  Once 02/11/20 1526 02/11/20 1641   02/05/20 0600  ceFAZolin (ANCEF) IVPB 2g/100 mL premix        2 g 200 mL/hr over 30 Minutes Intravenous To Short Stay 02/04/20 1054 02/05/20 0950   01/29/20 0600  ceFAZolin (ANCEF) IVPB 2g/100 mL premix        2 g 200 mL/hr over 30  Minutes Intravenous On call to O.R. 01/28/20 1511 01/30/20 0559   01/28/20 2000  cefTRIAXone (ROCEPHIN) 1 g in sodium chloride 0.9 % 100 mL IVPB        1  g 200 mL/hr over 30 Minutes Intravenous Every 24 hours 01/28/20 1925 02/02/20 0724   01/06/20 0900  cefTRIAXone (ROCEPHIN) 2 g in sodium chloride 0.9 % 100 mL IVPB  Status:  Discontinued        2 g 200 mL/hr over 30 Minutes Intravenous Every 12 hours 01/06/20 0105 01/06/20 2202   01/06/20 0800  vancomycin (VANCOCIN) IVPB 1000 mg/200 mL premix  Status:  Discontinued       "Followed by" Linked Group Details   1,000 mg 200 mL/hr over 60 Minutes Intravenous Every 12 hours 01/05/20 1904 01/06/20 2202   01/05/20 2000  vancomycin (VANCOREADY) IVPB 1500 mg/300 mL       "Followed by" Linked Group Details   1,500 mg 150 mL/hr over 120 Minutes Intravenous  Once 01/05/20 1904 01/06/20 0127   01/05/20 1830  cefTRIAXone (ROCEPHIN) 2 g in sodium chloride 0.9 % 100 mL IVPB        2 g 200 mL/hr over 30 Minutes Intravenous  Once 01/05/20 1829 01/05/20 2220       Time spent: 20 minutes    [name] ANP  [facility] Pager [phone].  04/06/2020, 12:47 PM  LOS: 91 days

## 2020-04-06 NOTE — Progress Notes (Signed)
Patient requested meds to be given early. Ilean Skill LPN

## 2020-04-06 NOTE — Progress Notes (Signed)
Seroquel can worsen depression sxs so will decrease dose to total 400 mg /day

## 2020-04-06 NOTE — Progress Notes (Addendum)
This chaplain responded to consult for spiritual care.  The chaplain understands the Pt. is out of the room for a procedure at the time of the visit.  This chaplain will F/U with spiritual care.  *1624 The chaplain returned for a spiritual care visit and rapport building. The chaplain checked in with the Pt. RN-Sian before entering the Pt. Room. The chaplain understands from the RN the Pt. was emotional upon returning from the earlier procedure, but was able to comfort herself without medication.  The Pt. is coloring as the chaplain entered the room and remained coloring throughout the visit. The chaplain's questions about the coloring experience were not answered and resulted in intermittent tears. The Pt. was able to share a coloring page with the chaplain and communicate a willingness for the chaplain to return with the page. The chaplain noticed the Pt. response to the RN's medication questions were clearly answered by the Pt.   This chaplain is available for F/U spiritual care as needed.

## 2020-04-06 NOTE — Progress Notes (Signed)
Informed [facility] safety order will expire in less than 1 hr

## 2020-04-07 LAB — CARBAMAZEPINE LEVEL, TOTAL: Carbamazepine Lvl: 3.2 ug/mL — ABNORMAL LOW (ref 4.0–12.0)

## 2020-04-07 MED ORDER — QUETIAPINE FUMARATE 50 MG PO TABS
25.0000 mg | ORAL_TABLET | Freq: Every morning | ORAL | Status: DC
  Administered 2020-04-07 – 2020-04-11 (×5): 25 mg via ORAL
  Filled 2020-04-07 (×5): qty 1

## 2020-04-07 NOTE — Progress Notes (Addendum)
[facility] PROGRESS NOTE  Anita Davis ZOX:[id number] DOB: 1986/03/31 DOA: 01/04/2020 PCP: [name], PA-C            12/8     Status: Remains inpatient appropriate because:Altered mental status and Unsafe d/c plan  Dispo:  The patient is from: Home              Anticipated d/c is to: SNF vs home with family.  Extensive conversation had with patient's stepmother [name].  She has been in contact with a local nursing facility in [facility] that apparently accept patients with neuro psychiatric issues.  It is best for patient if we are able to locate a facility that is close to family so they can visit her daily              Anticipated d/c date is: > 3 days              Patient currently is medically stable to d/c.  Barrier to discharge:SNF placement at a facility that can accommodate behavioral issues similar to dementia patients, Medicaid and disability applications in process.  TOC given contact information for above-stated nursing facility in [facility]. Recommendation is to place patient close to family to ensure continued improvement   Code Status: Full Family Communication: Stepmother [name] on 12/16 DVT prophylaxis: 12/15 consistently ambulatory therefore will discontinue Lovenox Vaccination status: Family confirmed initial Pfizer Covid vaccine dose given in June, second dose given on 11/2  Foley catheter: No  HPI: 34 year old female with known history of substance abuse on prior methadone, active hepatitis C and gestational diabetes who presented initially with confusion.  She was initially admitted to [facility] in September with a diagnosis of encephalopathy presumed secondary to drug overdose.  Subsequent work-up inconclusive.  LP without signs of infection, MRI brain unremarkable and unfortunately she remained encephalopathic.  EEG did reveal focal seizures.  AED therapy was initiated and seizures were suppressed and she was transferred to [facility] for  continuous EEG.  Apparently had episodes of status epilelepticus before full suppression of seizures with medications.  Subsequently neurological recovery was poor and delayed.  Neurology suspicious for autoimmune encephalitis and was given high-dose steroids with equivocal improvement but deteriorated after steroids completed.  Was given IVIG without any significant improvement in mentation.  For the past several months she has had waxing and waning mentation ranging from unresponsive to alert confused and verbal and at times restless and agitated.  When she is awake/alert she is able to ambulate but requires assistance due to impulsivity and gait instability.  Unfortunately these episodes of wakefulness were never sustained.  During the last week of November the decision was made to taper and discontinue all psychotropic medications due to lack of response and altered mentation.  Once this was accomplished patient became very alert and agitated but remained confused. Over time with adjustment of psychotropic medications patient remained awake, was able to feed herself and ambulate with minimal assistance.  She has demonstrated issues with impulsivity, anxiety and disinhibition including hypersexuality.  Currently symptoms are managed with Seroquel, Depakote, naltrexone, and Vistaril.  Remains inconsistent with verbal communication noting at times she is almost mute with severe flat affect versus being extremely appropriate and as her family describes "back to being [place] again".  Patient is able to write out her thoughts and feelings in a notebook and best communicates in this manner.  She currently is extremely depressed and questioning her own judgment regarding allowing her children to  be adopted out.  She has documented feelings of extreme low self-worth.  Palliative care and psychiatric team concurrently evaluated the patient on 12/14 continue to encourage her to suppress her thoughts and feelings in  written format.  Patient does not wish to seek out inpatient behavioral health treatment when she is medically stable but is willing to follow-up in the outpatient setting.  In regards to her mission history of status epilepticus in the context of no prior history of epilepsy, Dr. Melynda Ripple with neurology was contacted regarding the possibility of weaning and discontinuing antiepileptic medications. She has had no further seizures for greater than 2 months. Neurology recommendation is to begin slowly tapering and discontinuing antiepileptic drugs monitoring for any reemergence of seizure activity.   At this juncture pending improvement in behavioral issues plan is to hopefully discharge home with family.  Currently patient is on a multitude of medications that would be very expensive out-of-pocket and Medicaid remains pending.  We are hopeful that many of her medications can be weaned and discontinued to decrease financial impact at time of discharge.  Patient is also tolerating medications and food by mouth therefore gastrostomy tube discontinued on 12/15  Subjective: Awake and sitting up in bed coloring.  Agreed to communicate with me initially by written word.  Of note she appears agitated and writes rapidly.  Her communication is focused on her children that she believes we are keeping away from her and that we are lying to her about her current status and condition.  Based on what she wrote it appears she believes it to be 1998 at times.  Did not respond to reorientation attempts.  Eventually patient stood up several times and mobilize independently without difficulty to the restroom.  Discussed with her that many of her medications could be worsening some of her symptoms and we were making adjustments.  Also discussed again her presentation with repeated seizures that occurred over several weeks before they were controlled.  At this juncture patient abruptly began communicating quite clearly that she did  not have seizures.  When I asked her if she needed anything else she stated no.  **Adoptive family does NOT want [name] to have contact with her kids. She did allow the oldest to visit after she had her PEG tube.  Objective: Vitals:   04/07/20 0357 04/07/20 0841  BP: 96/71   Pulse: 82 77  Resp: 16 16  Temp: 97.7 F (36.5 C)   SpO2: 100% 100%    Intake/Output Summary (Last 24 hours) at 04/07/2020 1042 Last data filed at 04/07/2020 9326 Gross per 24 hour  Intake 720 ml  Output --  Net 720 ml   Filed Weights   03/29/20 0500 04/04/20 7124 04/05/20 5809  Weight: 72.5 kg 70.9 kg 71.6 kg    Exam: General: Awake, still sad and occasionally crying although somewhat more defiant and frustrated today Pulmonary: Bilateral lung sounds clear to auscultation anteriorly, no increased work of breathing, room air Cardiac: S1-S2, no peripheral edema, no JVD, no tachycardia Abdomen: Soft, nontender nondistended, PEG tube in place and clamped Neurological: Moves all extremities x4, no focal neurological deficits no tremors Psychiatric: Awake sad but not as emotional today with limited crying.  Primarily angry and agitated with belief that we are keeping her children from her purposefully and we are lying to her about her current physical and emotional state.  She denies having recent issues with seizures.  Able to communicate best by written word but will also spontaneously  speak clearly and appropriately.  Remains confused and oriented times name only unclear if she is oriented to hospital.  Would not consistently answer questions even when written down.   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 Zoloft and Abilify contributed to ongoing lethargy so tapered and discontinued.  Abilify also caused dystonic movements.  No benzodiazepines due to worsening of agitation -Continue naltrexone for hyper sexualization (patient with history of PTSD and narcotic abuse therefore prior to admission). -Symptoms worsened on Zyprexa    -Seroquel resumed on 12/9 and had been titrated up over 2 weeks to a max dose of 800 mg/day. as noted on 12/15 2 with significant emotional lability and worsening depression symptoms.  In review of the literature Seroquel can cause worsening of depression, suicidality therefore decision made to decrease dose.  Initially on 12/15 was decreased to the following regimen: 100 mg 10 AM-100 mg 5 PM and 200 mg HS and as of 12/16 dose will be decreased to 25 mg 10 AM and 200 mg HS -Continue Tegretol 200 mg twice daily behavioral disturbances, agitation and impulsivity  -Continue low-dose scheduled Vistaril to help with impulsivity as well -Continue one-to-one safety sitter because of impulsivity increased fall risk include wandering behaviors -Patient improvement and behaviors markedly improved with consistent interaction with family.  Recommendation for SNF placement should include placing patient in facility close to family to avoid regression in clinical status and worsening of behavior D/w Pam/CIR- rec [facility] if able to be dc'd home  Back pain/abnormal urinalysis -Patient with low-grade fever overnight and is now having back pain for days not responsive to NSAIDs and muscle relaxers -DG lumbar spine unremarkable -During hospitalization patient has been treated for pansensitive E. coli UTI as well as Salmonella UTI both of  which have been sensitive to Levaquin -Continue Levaquin to treat for 7 to 10 days given history of recurrent UTI since admission. -Urine culture ordered but apparently not completed-currently patient reports significant improvement in back pain  Hyperglycemia secondary to medication -Resolved -Hemoglobin A1c 5.8 consistent with prediabetes -Antipsychotics such as Seroquel and Zyprexa can cause hyperglycemia and drug-related diabetes mellitus we will continue to follow closely; patient has been having increased thirst and increased urination  Physical deconditioning -More consistent participation with PT and OT. -Continues to require SNF level of care due to safety issues and impulsivity but are hopeful can manage behavior enough that patient can return home with family  Acute on chronic depression prior to admission/known polysubstance abuse/suicidal ideation -Attempted to maximize depression therapy but seem to make patient's mentation worse and did not improve symptoms therefore Zoloft 100 mg and Abilify 2 mg discontinued after being tapered -Reexamined by palliative medicine and psychiatry on 12/14.  Please see their notes for details.  Patient agreeable to outpatient psych follow-up well is consideration for outpatient ECT if indicated/candidate.  Continues to have ongoing issues regarding feelings of low self-worth and regret over signing over her parental rights to her children.  Large hemorrhoids. -Markedly improved-LD Proctofoam 12/15  Dysphagia -Continue regular diet -Even when emotionally  distraught consistently will take medications and food by mouth therefore will discontinue PEG tube on 12/15-interventional radiology made aware     Other problems: Nonobstructive transaminitis in context of hepatitis C -Elevated HCV antibody with markedly elevated HCV RNA quantitative level  consistent with chronic hepatitis C  -LFTs remain stable and continue to slightly trend down  without an obstructive pattern noted -11/10: Discussed with ID Dr. Manson Passey.  Treatment of hepatitis C typically is initiated in the outpatient setting.  Medications can be quite expensive and usually take time to obtain insurance approval.   -ID recommends scheduling outpatient appointment their clinic closer to discharge date  ? Breast enlargement/gynecomastia -No clinical evidence of breast enlargement -Prolactin level low with normal FSH -Appearance of breast enlargement reported by family likely related to improve nutrition as well as abdominal binder moving up underneath breasts and malpositioning of breasts.  Diarrhea>>>Constipation Resolved Felt secondary to laxatives but given Salmonella UTI could have been related to Salmonella although at the time diarrhea was being worked up patient had no leukocytosis, no abdominal pain and no fevers.  Suspected oral Candida -Resolved after oral nystatin  Low back pain -Resolved -Completed 5 days of ibuprofen -Continue Voltaren gel and add as needed Flexeril (will DC Robaxin since there is to be ineffective)  Salmonella UTI -Completed amoxicillin    Data Reviewed: Basic Metabolic Panel: Recent Labs  Lab 04/01/20 0349 04/04/20 0445  NA 137 136  K 4.0 4.3  CL 100 98  CO2 27 26  GLUCOSE 129* 145*  BUN 11 16  CREATININE 0.51 0.60  CALCIUM 9.1 8.8*  MG  --  2.0   Liver Function Tests: Recent Labs  Lab 04/01/20 0349 04/04/20 0445  AST 97* 87*  ALT 127* 89*  ALKPHOS 181* 159*  BILITOT 0.5 0.8  PROT 6.2* 6.0*  ALBUMIN 3.3* 3.1*   No results for input(s): LIPASE, AMYLASE in the last 168 hours. No results for input(s): AMMONIA in the last 168 hours. CBC: Recent Labs  Lab 04/01/20 0349  WBC 5.4  HGB 12.7  HCT 38.7  MCV 96.5  PLT 348   Cardiac Enzymes: No results for input(s): CKTOTAL, CKMB, CKMBINDEX, TROPONINI in the last 168 hours. BNP (last 3 results) No results for input(s): BNP in the last 8760  hours.  ProBNP (last 3 results) No results for input(s): PROBNP in the last 8760 hours.  CBG: No results for input(s): GLUCAP in the last 168 hours.  No results found for this or any previous visit (from the past 240 hour(s)).   Studies: IR GASTROSTOMY TUBE REMOVAL/REPAIR  Result Date: 04/06/2020 INDICATION: Patient with history of dysphasia s/p surgically placed gastrostomy tube 02/05/2020 which was inadvertently removed, s/p new placement of gastrostomy tube in IR 02/11/2020. Patient now tolerating p.o. intake. Request is made for removal of gastrostomy tube. EXAM: BEDSIDE REMOVAL OF GASTROSTOMY TUBE COMPARISON:  None. MEDICATIONS: 10 mL viscous lidocaine CONTRAST:  None FLUOROSCOPY TIME:  None COMPLICATIONS: None immediate. PROCEDURE: A time-out was performed prior to the initiation of the procedure. The track of the existing gastrostomy tube was lubricated with viscous lidocaine. Using manual traction, the existing pull-through gastrostomy tube was removed intact. A dressing was placed. The patient tolerated the procedure well without immediate postprocedural complication. IMPRESSION: Successful bedside removal of pull-through gastrostomy tube without complication Read by: Elwin Mocha, PA-C Electronically Signed   By: Malachy Moan M.D.   On: 04/06/2020 14:24    Scheduled Meds: . carbamazepine  200 mg Oral BID  .  Chlorhexidine Gluconate Cloth  6 each Topical Daily  . cyclobenzaprine  7.5 mg Oral TID  . diclofenac Sodium  4 g Topical QID  . feeding supplement  237 mL Oral TID BM  . Gerhardt's butt cream  1 application Topical TID  . hydrOXYzine  10 mg Oral TID  . lacosamide  100 mg Oral BID   Followed by  . [START ON 04/09/2020] lacosamide  50 mg Oral BID  . levofloxacin  500 mg Oral Daily  . multivitamin with minerals  1 tablet Oral Daily  . naltrexone  50 mg Oral Daily  . phenobarbital  64.8 mg Oral BID  . phenytoin  200 mg Oral BID  . psyllium  1 packet Oral BID  .  vitamin B-6  100 mg Oral Daily  . QUEtiapine  200 mg Oral QHS  . QUEtiapine  25 mg Oral q morning - 10a  . thiamine  100 mg Oral Daily   Continuous Infusions: . sodium chloride      Principal Problem:   Refractory seizure (HCC) Active Problems:   Acute metabolic encephalopathy   Hypokalemia   Polysubstance abuse (HCC)   Elevated CK   Transaminitis   Chronic hepatitis C without hepatic coma (HCC)   Distended abdomen   Palliative care encounter   Colonic Ileus (HCC)   Organic brain syndrome   On enteral nutrition   Physical deconditioning   Protein-calorie malnutrition (HCC)   Inadequate oral nutritional intake   Impulse disorder   Consultants:  Neurology  Psychiatry  Interventional radiology  Surgery  Procedures:  9/14 lumbar puncture  9/15 EEG  9/16 EEG  9/17 EEG  9/19 overnight EEG with video  9/22 overnight EEG with video with discontinuation of long-term EEG monitoring on 9/25  9/24 core track placement  10/6 EEG  Antibiotics: Anti-infectives (From admission, onward)   Start     Dose/Rate Route Frequency Ordered Stop   04/05/20 1415  levofloxacin (LEVAQUIN) tablet 500 mg        500 mg Oral Daily 04/05/20 1327     03/19/20 1000  metroNIDAZOLE (FLAGYL) tablet 500 mg  Status:  Discontinued        500 mg Per Tube 2 times daily 03/19/20 0822 03/23/20 0827   03/18/20 1200  amoxicillin (AMOXIL) 250 MG/5ML suspension 500 mg  Status:  Discontinued        500 mg Per Tube Every 8 hours 03/18/20 0915 03/23/20 0559   03/17/20 1200  cefTRIAXone (ROCEPHIN) 1 g in sodium chloride 0.9 % 100 mL IVPB  Status:  Discontinued        1 g 200 mL/hr over 30 Minutes Intravenous Every 24 hours 03/17/20 1048 03/18/20 0913   03/16/20 1130  metroNIDAZOLE (FLAGYL) 50 mg/ml oral suspension 500 mg  Status:  Discontinued        500 mg Per Tube 2 times daily 03/16/20 1040 03/19/20 0821   03/16/20 1130  fluconazole (DIFLUCAN) 40 MG/ML suspension 152 mg        150 mg Per Tube   Once 03/16/20 1040 03/16/20 1245   02/11/20 1526  ceFAZolin (ANCEF) IVPB 2g/100 mL premix        2 g 200 mL/hr over 30 Minutes Intravenous  Once 02/11/20 1526 02/11/20 1641   02/05/20 0600  ceFAZolin (ANCEF) IVPB 2g/100 mL premix        2 g 200 mL/hr over 30 Minutes Intravenous To Short Stay 02/04/20 1054 02/05/20 0950   01/29/20 0600  ceFAZolin (ANCEF)  IVPB 2g/100 mL premix        2 g 200 mL/hr over 30 Minutes Intravenous On call to O.R. 01/28/20 1511 01/30/20 0559   01/28/20 2000  cefTRIAXone (ROCEPHIN) 1 g in sodium chloride 0.9 % 100 mL IVPB        1 g 200 mL/hr over 30 Minutes Intravenous Every 24 hours 01/28/20 1925 02/02/20 0724   01/06/20 0900  cefTRIAXone (ROCEPHIN) 2 g in sodium chloride 0.9 % 100 mL IVPB  Status:  Discontinued        2 g 200 mL/hr over 30 Minutes Intravenous Every 12 hours 01/06/20 0105 01/06/20 2202   01/06/20 0800  vancomycin (VANCOCIN) IVPB 1000 mg/200 mL premix  Status:  Discontinued       "Followed by" Linked Group Details   1,000 mg 200 mL/hr over 60 Minutes Intravenous Every 12 hours 01/05/20 1904 01/06/20 2202   01/05/20 2000  vancomycin (VANCOREADY) IVPB 1500 mg/300 mL       "Followed by" Linked Group Details   1,500 mg 150 mL/hr over 120 Minutes Intravenous  Once 01/05/20 1904 01/06/20 0127   01/05/20 1830  cefTRIAXone (ROCEPHIN) 2 g in sodium chloride 0.9 % 100 mL IVPB        2 g 200 mL/hr over 30 Minutes Intravenous  Once 01/05/20 1829 01/05/20 2220       Time spent: 20 minutes    [name] ANP  [facility] Pager [phone].  04/07/2020, 10:42 AM  LOS: 92 days
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
## 2020-04-07 NOTE — Plan of Care (Signed)
°  Problem: Clinical Measurements: Goal: Ability to maintain clinical measurements within normal limits will improve Outcome: Progressing Goal: Will remain free from infection Outcome: Progressing Goal: Diagnostic test results will improve Outcome: Progressing Goal: Respiratory complications will improve Outcome: Progressing Goal: Cardiovascular complication will be avoided Outcome: Progressing   Problem: Activity: Goal: Risk for activity intolerance will decrease Outcome: Progressing   Problem: Nutrition: Goal: Adequate nutrition will be maintained Outcome: Progressing   Problem: Coping: Goal: Level of anxiety will decrease Outcome: Progressing   Problem: Elimination: Goal: Will not experience complications related to bowel motility Outcome: Progressing Goal: Will not experience complications related to urinary retention Outcome: Progressing   Problem: Pain Managment: Goal: General experience of comfort will improve Outcome: Progressing   Problem: Safety: Goal: Ability to remain free from injury will improve Outcome: Progressing   Problem: Skin Integrity: Goal: Risk for impaired skin integrity will decrease Outcome: Progressing   Problem: Coping: Goal: Ability to adjust to condition or change in health will improve Outcome: Progressing Goal: Ability to identify appropriate support needs will improve Outcome: Progressing   Problem: Medication: Goal: Risk for medication side effects will decrease Outcome: Progressing   Problem: Clinical Measurements: Goal: Complications related to the disease process, condition or treatment will be avoided or minimized Outcome: Progressing Goal: Diagnostic test results will improve Outcome: Progressing   Problem: Safety: Goal: Verbalization of understanding the information provided will improve Outcome: Progressing   Problem: Self-Concept: Goal: Level of anxiety will decrease Outcome: Progressing Goal: Ability to  verbalize feelings about condition will improve Outcome: Progressing   Problem: Self-Concept: Goal: Level of anxiety will decrease Outcome: Progressing Goal: Ability to verbalize feelings about condition will improve Outcome: Progressing
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
## 2020-04-08 ENCOUNTER — Inpatient Hospital Stay (HOSPITAL_COMMUNITY): Payer: Medicaid Other

## 2020-04-08 MED ORDER — CLONAZEPAM 0.5 MG PO TABS
0.5000 mg | ORAL_TABLET | Freq: Once | ORAL | Status: AC
  Administered 2020-04-08: 10:00:00 0.5 mg via ORAL
  Filled 2020-04-08: qty 1

## 2020-04-08 MED ORDER — IBUPROFEN 600 MG PO TABS
600.0000 mg | ORAL_TABLET | Freq: Four times a day (QID) | ORAL | Status: DC | PRN
  Administered 2020-04-10 – 2020-05-22 (×36): 600 mg via ORAL
  Filled 2020-04-08 (×16): qty 1
  Filled 2020-04-08: qty 3
  Filled 2020-04-08 (×20): qty 1

## 2020-04-08 MED ORDER — OXYCODONE-ACETAMINOPHEN 5-325 MG PO TABS
1.0000 | ORAL_TABLET | Freq: Four times a day (QID) | ORAL | Status: DC | PRN
  Administered 2020-04-11 – 2020-04-14 (×3): 1 via ORAL
  Filled 2020-04-08 (×3): qty 1

## 2020-04-08 MED ORDER — ACETAMINOPHEN 325 MG PO TABS
650.0000 mg | ORAL_TABLET | Freq: Four times a day (QID) | ORAL | Status: DC | PRN
  Administered 2020-04-16 – 2020-06-24 (×13): 650 mg via ORAL
  Filled 2020-04-08 (×14): qty 2

## 2020-04-08 NOTE — Plan of Care (Signed)
Pt is alert and oriented. Pt is on RA. Pt told the sitter that "This is what she do" because the pt started talking to the sitter. And she told the sitter because she normally don't sit with her.  Problem: Education: Goal: Knowledge of General Education information will improve Description: Including pain rating scale, medication(s)/side effects and non-pharmacologic comfort measures Outcome: Progressing   Problem: Health Behavior/Discharge Planning: Goal: Ability to manage health-related needs will improve Outcome: Progressing   Problem: Clinical Measurements: Goal: Ability to maintain clinical measurements within normal limits will improve Outcome: Progressing Goal: Will remain free from infection Outcome: Progressing Goal: Diagnostic test results will improve Outcome: Progressing Goal: Respiratory complications will improve Outcome: Progressing Goal: Cardiovascular complication will be avoided Outcome: Progressing   Problem: Activity: Goal: Risk for activity intolerance will decrease Outcome: Progressing   Problem: Nutrition: Goal: Adequate nutrition will be maintained Outcome: Progressing   Problem: Coping: Goal: Level of anxiety will decrease Outcome: Progressing   Problem: Elimination: Goal: Will not experience complications related to bowel motility Outcome: Progressing Goal: Will not experience complications related to urinary retention Outcome: Progressing   Problem: Pain Managment: Goal: General experience of comfort will improve Outcome: Progressing   Problem: Safety: Goal: Ability to remain free from injury will improve Outcome: Progressing   Problem: Skin Integrity: Goal: Risk for impaired skin integrity will decrease Outcome: Progressing   Problem: Education: Goal: Expressions of having a comfortable level of knowledge regarding the disease process will increase Outcome: Progressing   Problem: Coping: Goal: Ability to adjust to condition or  change in health will improve Outcome: Progressing Goal: Ability to identify appropriate support needs will improve Outcome: Progressing   Problem: Health Behavior/Discharge Planning: Goal: Compliance with prescribed medication regimen will improve Outcome: Progressing   Problem: Medication: Goal: Risk for medication side effects will decrease Outcome: Progressing   Problem: Clinical Measurements: Goal: Complications related to the disease process, condition or treatment will be avoided or minimized Outcome: Progressing Goal: Diagnostic test results will improve Outcome: Progressing   Problem: Safety: Goal: Verbalization of understanding the information provided will improve Outcome: Progressing   Problem: Self-Concept: Goal: Level of anxiety will decrease Outcome: Progressing Goal: Ability to verbalize feelings about condition will improve Outcome: Progressing

## 2020-04-08 NOTE — Plan of Care (Signed)
  Problem: Education: Goal: Knowledge of General Education information will improve Description: Including pain rating scale, medication(s)/side effects and non-pharmacologic comfort measures Outcome: Progressing   Problem: Coping: Goal: Level of anxiety will decrease Outcome: Progressing   Problem: Elimination: Goal: Will not experience complications related to bowel motility Outcome: Progressing Goal: Will not experience complications related to urinary retention Outcome: Progressing   Problem: Safety: Goal: Verbalization of understanding the information provided will improve Outcome: Progressing

## 2020-04-08 NOTE — Progress Notes (Signed)
Physical Therapy Discharge [name] MRN: [id number] DOB: May 04, 1985 Today's Date: 04/08/2020 Time:  -     Patient discharged from PT services secondary to goals met and no further PT needs identified.  Please see latest therapy progress note for current level of functioning and progress toward goals.    Progress and discharge plan discussed with patient and/or caregiver: Patient unable to participate in discharge planning and no caregivers available   Pt seen ambulating in hall with NT without assist, pt with decreased velocity but no LOB or assist needed. PT discussed with NT Freight forwarder) and RN who both state pt is getting up and moving around wtihout assist. They feel comfortable mobilizing patient. Please re consult if this changes or pt regresses  GP     Kendrick Ranch 04/08/2020, 12:10 PM
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
## 2020-04-08 NOTE — Progress Notes (Signed)
Nutrition Follow-up  DOCUMENTATION CODES:   Not applicable  INTERVENTION:   Continue mvi with minerals daily  Continue Ensure Enlive/Plus po TID, each supplement provides 350 kcal and 20 grams of protein (Ensure Plus has 13 grams protein; Ensure Enlive has 20 grams; Ensure Enlive has been unavailable)  Continue Magic cup TID with meals, each supplement provides 290 kcal and 9 grams of protein  Staff should continue to assist pt with meals/supplements and encourage adequate po intake  NUTRITION DIAGNOSIS:   Inadequate oral intake related to lethargy/confusion as evidenced by meal completion < 25%.  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 PMH includes hepatitis and polysubstance abuse.  9/24 Cortrak placed (gastric) 10/10 Cortrak dislodged, TF held 10/11 Cortrak advanced (tip in proximal duodenum per KUB), TF restarted 10/14 TF reduced to 19ml/hr due to colonic ileus 10/15 G-tube placement 10/16 TF resumed 10/18 TF rate returned to 76ml/hr; G-tube pulled out 1 inch so TF held 10/21 G-tube replaced 10/27 diet advanced to regular 12/15 PEG tube removed  Per MD, pt's mentation, balance, and gait have improved to the point where they are going to trial having her in the room without a Recruitment consultant. Psychiatry and PMT are following along with pt's case and are encouraging her to write down her thoughts and feelings. Per MD, most of what she writes is incomprehensible, but what is intelligible is often in regards to pt losing custody of her kids and being depressed.   PO Intake: 0-75% consumption x last 8 recorded meals (40% average meal  intake). Pt currently receiving Ensure Enlive/Plus po TID and Magic Cup po TID. Per RN, pt has continued to do well with these supplements. Will continue with current nutrition plan of care.   Admit wt: 74.8 kg Current wt: 71.6 kg  No UOP documented.  Labs reviewed.  Medications: mvi, metamucil, vitamin B6, thiamine Diet Order:   Diet Order            Diet regular Room service appropriate? Yes; Fluid consistency: Thin  Diet effective now                 EDUCATION NEEDS:   No education needs have been identified at this time  Skin:  Skin Assessment: Reviewed RN Assessment Skin Integrity Issues:: Incisions Incisions: abdomen  Last BM:  12/16  Height:   Ht Readings from Last 1 Encounters:  01/06/20 5\' 5"  (1.651 m)    Weight:   Wt Readings from Last 1 Encounters:  04/05/20 71.6 kg   BMI:  Body mass index is 26.27 kg/m.  Estimated Nutritional Needs:   Kcal:  1800-2000  Protein:  90-100 grams  Fluid:  >/=1.8L/d    04/07/20, MS, RD, LDN RD pager number and weekend/on-call pager number located in [place].

## 2020-04-09 LAB — URINE CULTURE
Culture: NO GROWTH
Special Requests: NORMAL

## 2020-04-09 NOTE — Progress Notes (Signed)
Patient seen and examined.  She was sitting inside the nursing station in a couch.  She did not talk to me despite multiple attempts.  Looks very comfortable.  Plan: Continue current plan of care.  No changes today.  Pending placement.  Total time spent: 15 minutes

## 2020-04-10 NOTE — Plan of Care (Signed)
  Problem: Clinical Measurements: Goal: Ability to maintain clinical measurements within normal limits will improve Outcome: Progressing   Problem: Health Behavior/Discharge Planning: Goal: Ability to manage health-related needs will improve Outcome: Completed/Met   Problem: Education: Goal: Knowledge of General Education information will improve Description: Including pain rating scale, medication(s)/side effects and non-pharmacologic comfort measures Outcome: Progressing

## 2020-04-10 NOTE — Progress Notes (Signed)
Patient seen and examined.  She was sitting in a couch inside the nurses station and watched by the nurses.  She was coloring her diagrams but would not talk.  Did not open her mouth to interview.  Assessment and plan:  Persistent metabolic encephalopathy, organic brain syndrome, status epilepticus: On multiple psychotropic medications.  Followed by neurology and psychiatry.  Continue monitoring in the hospital and adjustment of medications.  No safe destination for discharge.  Total time spent: 15 minutes

## 2020-04-11 MED ORDER — QUETIAPINE FUMARATE 50 MG PO TABS
100.0000 mg | ORAL_TABLET | Freq: Every day | ORAL | Status: DC
  Administered 2020-04-11: 22:00:00 100 mg via ORAL
  Filled 2020-04-11: qty 1

## 2020-04-11 MED ORDER — TRAZODONE HCL 50 MG PO TABS
50.0000 mg | ORAL_TABLET | Freq: Every day | ORAL | Status: DC
  Administered 2020-04-11 – 2020-04-13 (×3): 50 mg via ORAL
  Filled 2020-04-11 (×3): qty 1

## 2020-04-11 MED ORDER — CLONAZEPAM 0.5 MG PO TABS
0.5000 mg | ORAL_TABLET | Freq: Once | ORAL | Status: AC
  Administered 2020-04-11: 16:00:00 0.5 mg via ORAL
  Filled 2020-04-11: qty 1

## 2020-04-11 NOTE — Progress Notes (Addendum)
Patient continually getting out of bed and wandering, going into patient's room across the hall, stating "I want to sleep in bed with him". Patient reminded that she needs to stay in bed when someone isn't with her and that she cannot go into other patient's rooms. Not following instructions, continues to wander and get out of bed, not easily redirectable, is fixated on entering the room across the hall. Patient given Klonopin and scheduled vistaril per NP. Will continue to monitor.

## 2020-04-11 NOTE — Progress Notes (Signed)
Occupational Therapy Treatment [name] MRN: [id number] DOB: June 14, 1985 Today's Date: 04/11/2020    History of present illness 34 year old with past medical history significant for hepatitis C, polysubstance abuse brought to the hospital 9/13 for disorientation which was suspected to be substance abuse.  After she became more lucid, she was discharged, but then police brought her back later in the day and they found her passed out in a parking lot.  Urine drug screen was positive for benzodiazepine and THC. An EEG done on 9/15 showed patient was in status epilepticus.  Patient was a started on Keppra.  Despite these, EEG noted continued seizures requiring additional medications as well.  Finally on 9/18, seizures broke. Follow-up EEG on 9/20 noted evidence of epilepticity from the left central temporal region.  Repeat EEG done on 9/22 noted epileptogenicity from the left central temporal region.  Pt with PEG placed on 02/05/20.    OT comments  Pt noted to continuously state "its the rapture". Pt witting down information all over room and in coloring book. Pt noted to have colored the bed rail of hospital bed. Pt very slow to respond today and guarded. Pt responding to OT that is playing Christmas music and speaking to OT when ignoring other staff verbalize questions. Recommendation SNF at this time.    Follow Up Recommendations  SNF    Equipment Recommendations  3 in 1 bedside commode    Recommendations for Other Services Speech consult    Precautions / Restrictions Precautions Precautions: Fall Precaution Comments: seizure       Mobility Bed Mobility Overal bed mobility: Modified Independent                Transfers Overall transfer level: Modified independent                    Balance                                           ADL either performed or assessed with clinical judgement   ADL   Eating/Feeding: Modified  independent               Upper Body Dressing : Supervision/safety       Toilet Transfer: Supervision/safety           Functional mobility during ADLs: Supervision/safety General ADL Comments: pt setting bed alarm off on arrival. Pt navigate to RN station and back without cues for room location. pt getting oob again and requesting OT ride her wheelchair. Pt transferred in the halls and given candy. pt sitting at RN station with staff drawing. pt noted to have multiple papers in room with written information on them and some balled up.     Vision       Perception     Praxis      Cognition Arousal/Alertness: Awake/alert Behavior During Therapy: Anxious Overall Cognitive Status: Impaired/Different from baseline                                 General Comments: pt repeating "the rapture its the rapture"        Exercises     Shoulder Instructions       General Comments no Peg and no dressing on peg site    Pertinent Vitals/ Pain  Pain Assessment: No/denies pain  Home Living                                          Prior Functioning/Environment              Frequency  Min 1X/week        Progress Toward Goals  OT Goals(current goals can now be found in the care plan section)  Progress towards OT goals: Progressing toward goals  Acute Rehab OT Goals Patient Stated Goal: to see the chaplain OT Goal Formulation: With patient Time For Goal Achievement: 04/25/20 Potential to Achieve Goals: Good ADL Goals Pt Will Perform Eating: with modified independence;sitting Pt Will Perform Grooming: with modified independence;standing Additional ADL Goal #1: Pt will folow 5 step pathfinding task with written instructions min (A) Additional ADL Goal #2: pt will complete full adl at sink level supervision level Additional ADL Goal #3: pt will gather all adls items in room needed to complete bath mod i  Plan Discharge plan  remains appropriate    Co-evaluation                 AM-PAC OT "6 Clicks" Daily Activity     Outcome Measure   Help from another person eating meals?: None Help from another person taking care of personal grooming?: A Little Help from another person toileting, which includes using toliet, bedpan, or urinal?: A Little Help from another person bathing (including washing, rinsing, drying)?: A Little Help from another person to put on and taking off regular upper body clothing?: A Little Help from another person to put on and taking off regular lower body clothing?: A Little 6 Click Score: 19    End of Session    OT Visit Diagnosis: Unsteadiness on feet (R26.81);Muscle weakness (generalized) (M62.81);Pain;Other symptoms and signs involving the nervous system (R29.898);Other symptoms and signs involving cognitive function;Cognitive communication deficit (R41.841);Ataxia, unspecified (R27.0);Feeding difficulties (R63.3);Other abnormalities of gait and mobility (R26.89)   Activity Tolerance Patient tolerated treatment well;Other (comment)   Patient Left in chair;Other (comment) (RN station)   Nurse Communication Mobility status;Precautions        Time: 3532-9924 OT Time Calculation (min): 26 min  Charges: OT General Charges $OT Visit: 1 Visit OT Treatments $Self Care/Home Management : 8-22 mins   Brynn, OTR/L  Acute Rehabilitation Services Pager: [phone] Office: [phone] .    Mateo Flow 04/11/2020, 3:49 PM

## 2020-04-11 NOTE — Progress Notes (Signed)
Received secure chat from pts RN. Pt has been more emotional and crying today- requesting something to help her sleep. Will give 1x dose  Klonopin 0.5 mg. Will go ahead and decrease HS dose Seroquel to 100 mg since this med can worsen depression symptoms. She has prn Ambien available at HS. Will also add low dose Trazadone at HS as I anticipate further taper of Seroquel to lowest dose possible to treat brain injury sequelae.

## 2020-04-11 NOTE — Progress Notes (Signed)
Patient has been wandering  Going to the next door patient room stating" I want to sleep in bed with him."  Pt educated and instructed that  She cant go into any other patients room om the unit.  Pt did not followed instruction and kept coming out of her room   restless and  quiet on edge. Brought to nursing station to sit  At the desk with staff until  Prn vistaril 50 mg and ambien 5mg  given. Pt finally went to sleep  .  Alert this am but still in bed no wandering at this time. RN will monitor pt closely

## 2020-04-11 NOTE — Progress Notes (Addendum)
[facility] PROGRESS NOTE  Anita Davis RUE:[id number] DOB: 1986-04-05 DOA: 01/04/2020 PCP: [name], PA-C              03/30/20                         04/08/20    Status: Remains inpatient appropriate because:Altered mental status and Unsafe d/c plan  Dispo:  The patient is from: Home              Anticipated d/c is to: SNF vs home with family.  Extensive conversation had with patient's stepmother [name].  She has been in contact with a local nursing facility in [facility] that apparently accept patients with neuro psychiatric issues.  It is best for patient if we are able to locate a facility that is close to family so they can visit her daily              Anticipated d/c date is: > 3 days              Patient currently is medically stable to d/c.  Barrier to discharge:SNF placement at a facility that can accommodate behavioral issues similar to dementia patients, Medicaid and disability applications in process.[facility] reviewing chart (family's preference since is close to them and would allow daily visits)   Code Status: Full Family Communication: Stepmother [name] father at bedside on 12/17 DVT prophylaxis: 12/15 consistently ambulatory therefore will discontinue Lovenox Vaccination status: Family confirmed initial Pfizer Covid vaccine dose given in June, second dose given on 11/2  Foley catheter: No  HPI: 34 year old female with known history of substance abuse on prior methadone, active hepatitis C and gestational diabetes who presented initially with confusion.  She was initially admitted to [facility] in September with a diagnosis of encephalopathy presumed secondary to drug overdose.  Subsequent work-up inconclusive.  LP without signs of infection, MRI brain unremarkable and unfortunately she remained encephalopathic.  EEG did reveal focal seizures.  AED therapy was initiated and seizures were suppressed and she was transferred to [facility]  for continuous EEG.  Apparently had episodes of status epilelepticus before full suppression of seizures with medications.  Subsequently neurological recovery was poor and delayed.  Neurology suspicious for autoimmune encephalitis and was given high-dose steroids with equivocal improvement but deteriorated after steroids completed.  Was given IVIG without any significant improvement in mentation.  For the past several months she has had waxing and waning mentation ranging from unresponsive to alert confused and verbal and at times restless and agitated.  When she is awake/alert she is able to ambulate but requires assistance due to impulsivity and gait instability.  Unfortunately these episodes of wakefulness were never sustained.  During the last week of November the decision was made to taper and discontinue all psychotropic medications due to lack of response and altered mentation.  Once this was accomplished patient became very alert and agitated but remained confused. Over time with adjustment of psychotropic medications patient remained awake, was able to feed herself and ambulate with minimal assistance.  She has demonstrated issues with impulsivity, anxiety and disinhibition including hypersexuality.  Currently symptoms are managed with Seroquel, Depakote, naltrexone, and Vistaril.  Remains inconsistent with verbal communication noting at times she is almost mute with severe flat affect versus being extremely appropriate and as her family describes "back to being Media again".  Patient is able to write out her thoughts and feelings in a notebook.  Most of the writings are incoherent at times and her sentence structure appears more consistent with gibberish as opposed to true communication.  Her best responses are simple 1-2 word responses/ phrases.  She appears to be extremely depressed. She perseverates on not having custody of her children and is confused believing that this is an acute event that is  occurring now as opposed to something that had occurred several years prior to current admission. She has written phrases such as "I am a dumbass" and "please forgive me God" but does not have enough insight to elaborate when further questioned.  The palliative care and psychiatric teams concurrently evaluated the patient on 12/14 continue to encourage her to write down her thoughts and feelings.    In regards to her mission history of status epilepticus in the context of no prior history of epilepsy, Dr. Melynda Ripple with neurology was contacted regarding the possibility of weaning and discontinuing antiepileptic medications. She has had no further seizures for greater than 2 months. Neurology recommendation is to begin slowly tapering and discontinuing antiepileptic drugs monitoring for any reemergence of seizure activity.   PEG tube has been discontinued.  Patient is eating consistently and taking meds by mouth without difficulty.  Although her mentation has not returned to her preadmission baseline there is no indication at this juncture to continue safety sitter even with her underlying impulsivity.  She is able to ambulate without difficulty and maintained steady appropriate gait.  She is not a fall risk at this juncture.  She is not requiring safety restraints.  Subjective: Awake and quite restless this morning.  Minimal conversation.  Shaking head repeatedly no and does not make eye contact.  Does have purposeful movement and is able to reposition self in bed.  Has refused to take oral medications yet when nurse takes medications away from patient she states "I will take my pills".  When pills are brought back to patient same behavior reemerges.  Nurse states she has done this multiple times this morning and has been unable to get patient to take pills.  During my time in the room patient refused meds and twice stated she would take them and they were meds were brought back to patient to take she would not  take them.  1100 am: Took meds by mouth  Objective: Vitals:   04/10/20 2035 04/11/20 0452  BP: 118/85 119/81  Pulse: 99 79  Resp: 20 18  Temp:  97.7 F (36.5 C)  SpO2: 92% 100%    Intake/Output Summary (Last 24 hours) at 04/11/2020 0958 Last data filed at 04/11/2020 0300 Gross per 24 hour  Intake 560 ml  Output 450 ml  Net 110 ml   Filed Weights   03/29/20 0500 04/04/20 0632 04/05/20 0613  Weight: 72.5 kg 70.9 kg 71.6 kg    Exam: General: Awake and restless, squirming in bed moving pelvis rhythmically at times.  No eye contact. Pulmonary: Lungs are clear to auscultation anteriorly with no increased work of breathing, room air Cardiac: Heart sounds are S1-S2 without rubs murmurs thrills or gallops, no tachycardia.  Pulses regular.  No peripheral edema. Abdomen: Soft, nontender nondistended, at baseline has been able to feed self independently and was taking medications regularly up until today.  Dressing removed from PEG site which is unremarkable in appearance. Neurological: Moves all extremities x4, no focal neurological deficits, no tremors Psychiatric: Awake and alert but restless this morning.  Again having some undesirable behavior by stating she will take medications  and when medications are offered she refuses.  He is oriented times name but otherwise disoriented.   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  Unfortunately she continues to manifest neurological deficits consistent with brain injury: impulsivity, persistent confusion, inability to process environmental information as well as cognitive information to form cohesive thoughts for anticipate permanent disability secondary to  brain injury and will likely require 24/7 care after discharge from hospital (over the weekend patient was impulsive and left her room.  She went into the room of a female patient and told the staff that she "wanted to sleep in bed with him"). -Today she is refusing medications.  PEG tube has been discontinued.  Do not have other route for medications. -Follow-up EEGs x 2 neg-Continue AED taper: Vimpat (LD due 12/21) followed by Dilantin followed by phenobarbital -Continue naltrexone for hyper sexualization (patient with history of PTSD and narcotic abuse prior to admission). -Continue Seroquel 25 mg 10 AM and 200 mg HS -Continue Tegretol 200 mg twice daily behavioral disturbances, agitation and impulsivity  -Continue low-dose scheduled Vistaril to help with impulsivity   D/w Pam/CIR- rec having pt fu with the [facility] if able to be dc'd home  Back pain 2/2 abnormal urinalysis consistent with UTI -Patient with low-grade fever overnight and is now having back pain for days not responsive to NSAIDs and muscle relaxers -DG lumbar spine unremarkable -During hospitalization patient has been treated for pansensitive E. coli UTI as well as Salmonella UTI both of which have been sensitive to Levaquin -Completed Levaquin 12/20 -Urine culture listed as obtained within epic although unable to locate pending result.  Urine culture reordered 12/17.  Unfortunately showed no growth that was obtained after antibiotics have been initiated -CT abdomen and pelvis on 12/17 unremarkable  Hyperglycemia secondary to medication -Resolved -Hemoglobin A1c 5.8 consistent with prediabetes -Antipsychotics such as Seroquel and Zyprexa can cause hyperglycemia and drug-related diabetes mellitus we will continue to follow closely; patient has been having increased thirst and increased urination  Acute on chronic depression prior to admission/known polysubstance abuse/suicidal ideation -Attempted to maximize depression  therapy but seem to make patient's mentation worse and did not improve symptoms therefore Zoloft 100 mg and Abilify 2 mg discontinued after being tapered -Reexamined by palliative medicine and psychiatry on 12/14.  Please see their notes for details.  Patient agreeable to outpatient psych follow-up well is consideration for outpatient ECT if indicated/candidate.  This has been discussed with patient's family i.e. her stepmother and father and if patient stable enough to discharge to home environment they have planned on pursuing this option.        Other problems: Nonobstructive transaminitis in context of hepatitis C -Elevated HCV antibody with markedly elevated HCV RNA quantitative level  consistent with chronic hepatitis C  -LFTs remain stable and continue to slightly trend down without an obstructive pattern noted -11/10: Discussed with ID Dr. Manson Passey.  Treatment of hepatitis C typically is initiated in the outpatient setting.  Medications can be quite expensive and usually take time to obtain insurance approval.   -ID recommends scheduling outpatient appointment their clinic closer to discharge date  ? Breast enlargement/gynecomastia -No clinical evidence of breast enlargement -Prolactin level low with normal FSH -Appearance of breast enlargement reported by family likely related to improve nutrition  as well as abdominal binder moving up underneath breasts and malpositioning of breasts.  Diarrhea>>>Constipation Resolved Felt secondary to laxatives but given Salmonella UTI could have been related to Salmonella although at the time diarrhea was being worked up patient had no leukocytosis, no abdominal pain and no fevers.  Suspected oral Candida -Resolved after oral nystatin  Low back pain -Resolved -Completed 5 days of ibuprofen -Continue Voltaren gel and add as needed Flexeril (will DC Robaxin since there is to be ineffective)  Salmonella UTI -Completed amoxicillin  Physical  deconditioning -Resolved -Does continue to exhibit impulsivity but maintains appropriate balance and gait no longer requires one-to-one safety sitter  Large hemorrhoids. -Resolved -Markedly improved-LD Proctofoam 12/15  Dysphagia -Resolved -Continue regular diet  Data Reviewed: Basic Metabolic Panel: No results for input(s): NA, K, CL, CO2, GLUCOSE, BUN, CREATININE, CALCIUM, MG, PHOS in the last 168 hours. Liver Function Tests: No results for input(s): AST, ALT, ALKPHOS, BILITOT, PROT, ALBUMIN in the last 168 hours. No results for input(s): LIPASE, AMYLASE in the last 168 hours. No results for input(s): AMMONIA in the last 168 hours. CBC: No results for input(s): WBC, NEUTROABS, HGB, HCT, MCV, PLT in the last 168 hours. Cardiac Enzymes: No results for input(s): CKTOTAL, CKMB, CKMBINDEX, TROPONINI in the last 168 hours. BNP (last 3 results) No results for input(s): BNP in the last 8760 hours.  ProBNP (last 3 results) No results for input(s): PROBNP in the last 8760 hours.  CBG: No results for input(s): GLUCAP in the last 168 hours.  Recent Results (from the past 240 hour(s))  Urine Culture     Status: None   Collection Time: 04/08/20  9:32 AM   Specimen: Urine, Clean Catch  Result Value Ref Range Status   Specimen Description URINE, CLEAN CATCH  Final   Special Requests Normal  Final   Culture   Final    NO GROWTH Performed at [facility], [address] [place], [place] [address]    Report Status 04/09/2020 FINAL  Final     Studies: No results found.  Scheduled Meds: . carbamazepine  200 mg Oral BID  . Chlorhexidine Gluconate Cloth  6 each Topical Daily  . cyclobenzaprine  7.5 mg Oral TID  . diclofenac Sodium  4 g Topical QID  . feeding supplement  237 mL Oral TID BM  . Gerhardt's butt cream  1 application Topical TID  . hydrOXYzine  10 mg Oral TID  . lacosamide  50 mg Oral BID  . levofloxacin  500 mg Oral Daily  . multivitamin with minerals  1  tablet Oral Daily  . naltrexone  50 mg Oral Daily  . phenobarbital  64.8 mg Oral BID  . phenytoin  200 mg Oral BID  . psyllium  1 packet Oral BID  . vitamin B-6  100 mg Oral Daily  . QUEtiapine  200 mg Oral QHS  . QUEtiapine  25 mg Oral q morning - 10a  . thiamine  100 mg Oral Daily   Continuous Infusions: . sodium chloride      Principal Problem:   Refractory seizure (HCC) Active Problems:   Acute metabolic encephalopathy   Hypokalemia   Polysubstance abuse (HCC)   Elevated CK   Transaminitis   Chronic hepatitis C without hepatic coma (HCC)   Distended abdomen   Palliative care encounter   Colonic Ileus (HCC)   Organic brain syndrome   On enteral nutrition   Physical deconditioning   Protein-calorie malnutrition (HCC)  Inadequate oral nutritional intake   Impulse disorder   Consultants:  Neurology  Psychiatry  Interventional radiology  Surgery  Procedures:  9/14 lumbar puncture  9/15 EEG  9/16 EEG  9/17 EEG  9/19 overnight EEG with video  9/22 overnight EEG with video with discontinuation of long-term EEG monitoring on 9/25  9/24 core track placement  10/6 EEG  12/15 PEG tube removed  Antibiotics: Anti-infectives (From admission, onward)   Start     Dose/Rate Route Frequency Ordered Stop   04/05/20 1415  levofloxacin (LEVAQUIN) tablet 500 mg        500 mg Oral Daily 04/05/20 1327     03/19/20 1000  metroNIDAZOLE (FLAGYL) tablet 500 mg  Status:  Discontinued        500 mg Per Tube 2 times daily 03/19/20 0822 03/23/20 0827   03/18/20 1200  amoxicillin (AMOXIL) 250 MG/5ML suspension 500 mg  Status:  Discontinued        500 mg Per Tube Every 8 hours 03/18/20 0915 03/23/20 0559   03/17/20 1200  cefTRIAXone (ROCEPHIN) 1 g in sodium chloride 0.9 % 100 mL IVPB  Status:  Discontinued        1 g 200 mL/hr over 30 Minutes Intravenous Every 24 hours 03/17/20 1048 03/18/20 0913   03/16/20 1130  metroNIDAZOLE (FLAGYL) 50 mg/ml oral suspension 500 mg   Status:  Discontinued        500 mg Per Tube 2 times daily 03/16/20 1040 03/19/20 0821   03/16/20 1130  fluconazole (DIFLUCAN) 40 MG/ML suspension 152 mg        150 mg Per Tube  Once 03/16/20 1040 03/16/20 1245   02/11/20 1526  ceFAZolin (ANCEF) IVPB 2g/100 mL premix        2 g 200 mL/hr over 30 Minutes Intravenous  Once 02/11/20 1526 02/11/20 1641   02/05/20 0600  ceFAZolin (ANCEF) IVPB 2g/100 mL premix        2 g 200 mL/hr over 30 Minutes Intravenous To Short Stay 02/04/20 1054 02/05/20 0950   01/29/20 0600  ceFAZolin (ANCEF) IVPB 2g/100 mL premix        2 g 200 mL/hr over 30 Minutes Intravenous On call to O.R. 01/28/20 1511 01/30/20 0559   01/28/20 2000  cefTRIAXone (ROCEPHIN) 1 g in sodium chloride 0.9 % 100 mL IVPB        1 g 200 mL/hr over 30 Minutes Intravenous Every 24 hours 01/28/20 1925 02/02/20 0724   01/06/20 0900  cefTRIAXone (ROCEPHIN) 2 g in sodium chloride 0.9 % 100 mL IVPB  Status:  Discontinued        2 g 200 mL/hr over 30 Minutes Intravenous Every 12 hours 01/06/20 0105 01/06/20 2202   01/06/20 0800  vancomycin (VANCOCIN) IVPB 1000 mg/200 mL premix  Status:  Discontinued       "Followed by" Linked Group Details   1,000 mg 200 mL/hr over 60 Minutes Intravenous Every 12 hours 01/05/20 1904 01/06/20 2202   01/05/20 2000  vancomycin (VANCOREADY) IVPB 1500 mg/300 mL       "Followed by" Linked Group Details   1,500 mg 150 mL/hr over 120 Minutes Intravenous  Once 01/05/20 1904 01/06/20 0127   01/05/20 1830  cefTRIAXone (ROCEPHIN) 2 g in sodium chloride 0.9 % 100 mL IVPB        2 g 200 mL/hr over 30 Minutes Intravenous  Once 01/05/20 1829 01/05/20 2220       Time spent: 20 minutes    [name]  Rennis Harding ANP  [facility] Pager [phone].  04/11/2020, 9:58 AM  LOS: 96 days

## 2020-04-11 NOTE — TOC Progression Note (Signed)
Transition of Care [facility]) - Progression Note    Patient Details  Name: Anita Davis MRN: [id number] Date of Birth: 05/09/1985  [facility] CM/SW Contact  Kermit Balo, RN Phone Number: 04/11/2020, 1:16 PM  Clinical Narrative:    CM called [facility] early last week and they were going to review the patients information. CM had not heard back and called on Friday without an answer at the facility. CM called today and the admissions person is out until Wednesday. CM left a voicemail. TOC following.   Expected Discharge Plan: Skilled Nursing Facility Barriers to Discharge: Continued Medical Work up,Inadequate or no insurance,Active Substance Use - Placement,SNF Pending payor source - LOG,SNF Pending Medicaid,SNF Pending bed offer  Expected Discharge Plan and Services Expected Discharge Plan: Skilled Nursing Facility In-house Referral: Artist   Post Acute Care Choice: Skilled Nursing Facility Living arrangements for the past 2 months: Mobile Home                                       Social Determinants of Health (SDOH) Interventions    Readmission Risk Interventions No flowsheet data found.

## 2020-04-12 MED ORDER — CLONAZEPAM 0.25 MG PO TBDP
0.2500 mg | ORAL_TABLET | Freq: Once | ORAL | Status: AC
  Administered 2020-04-12: 09:00:00 0.25 mg via ORAL
  Filled 2020-04-12: qty 1

## 2020-04-12 MED ORDER — HYDROXYZINE HCL 25 MG PO TABS
25.0000 mg | ORAL_TABLET | Freq: Three times a day (TID) | ORAL | Status: DC | PRN
  Administered 2020-04-12 – 2020-04-14 (×5): 25 mg via ORAL
  Filled 2020-04-12 (×5): qty 1

## 2020-04-12 MED ORDER — PHENYTOIN 50 MG PO CHEW
100.0000 mg | CHEWABLE_TABLET | Freq: Two times a day (BID) | ORAL | Status: AC
  Administered 2020-04-12 – 2020-04-14 (×6): 100 mg via ORAL
  Filled 2020-04-12 (×7): qty 2

## 2020-04-12 MED ORDER — HYDROXYZINE HCL 25 MG PO TABS: 25.0000 mg | ORAL_TABLET | Freq: Four times a day (QID) | ORAL | Status: DC | PRN

## 2020-04-12 MED ORDER — CARBAMAZEPINE 200 MG PO TABS
400.0000 mg | ORAL_TABLET | Freq: Two times a day (BID) | ORAL | Status: DC
  Administered 2020-04-12 – 2020-04-16 (×9): 400 mg via ORAL
  Filled 2020-04-12 (×12): qty 2

## 2020-04-12 MED ORDER — PHENYTOIN 50 MG PO CHEW
50.0000 mg | CHEWABLE_TABLET | Freq: Two times a day (BID) | ORAL | Status: AC
  Administered 2020-04-15 – 2020-04-17 (×5): 50 mg via ORAL
  Filled 2020-04-12 (×6): qty 1

## 2020-04-12 MED ORDER — ZIPRASIDONE HCL 20 MG PO CAPS
20.0000 mg | ORAL_CAPSULE | Freq: Two times a day (BID) | ORAL | Status: DC
  Administered 2020-04-12 – 2020-04-14 (×5): 20 mg via ORAL
  Filled 2020-04-12 (×5): qty 1

## 2020-04-12 MED ORDER — MELATONIN 5 MG PO TABS
5.0000 mg | ORAL_TABLET | Freq: Every day | ORAL | Status: DC
  Administered 2020-04-12 – 2020-05-03 (×19): 5 mg via ORAL
  Filled 2020-04-12 (×19): qty 1

## 2020-04-12 MED ORDER — DIPHENHYDRAMINE HCL 25 MG PO CAPS: 50.0000 mg | ORAL_CAPSULE | Freq: Every evening | ORAL | Status: DC | PRN

## 2020-04-12 MED ORDER — HYDROXYZINE HCL 25 MG PO TABS
25.0000 mg | ORAL_TABLET | Freq: Three times a day (TID) | ORAL | Status: DC
  Administered 2020-04-12: 10:00:00 25 mg via ORAL
  Filled 2020-04-12: qty 1

## 2020-04-12 MED ORDER — ZIPRASIDONE MESYLATE 20 MG IM SOLR
20.0000 mg | Freq: Once | INTRAMUSCULAR | Status: AC
  Administered 2020-04-12: 09:00:00 20 mg via INTRAMUSCULAR
  Filled 2020-04-12 (×2): qty 20

## 2020-04-12 NOTE — Progress Notes (Signed)
Pt agitated, writhing in bed, nonverbal. Pt writing that she misses her sons (Anita Davis and Anita Davis). Notified NP Rennis Harding via text chat. This is baseline for pt and new medication regime will take time.

## 2020-04-12 NOTE — Progress Notes (Addendum)
[facility] PROGRESS NOTE  Anita Davis ZOX:[id number] DOB: 12/04/1985 DOA: 01/04/2020 PCP: [name], PA-C              03/30/20                         04/08/20    Status: Remains inpatient appropriate because:Altered mental status and Unsafe d/c plan  Dispo:  The patient is from: Home              Anticipated d/c is to: SNF vs home with family.  Extensive conversation had with patient's stepmother [name].  She has been in contact with a local nursing facility in [facility] that apparently accept patients with neuro psychiatric issues.  It is best for patient if we are able to locate a facility that is close to family so they can visit her daily              Anticipated d/c date is: > 3 days              Patient currently is medically stable to d/c.  Barrier to discharge:SNF placement at a facility that can accommodate behavioral issues similar to dementia patients, Medicaid and disability applications in process.[facility] reviewing chart (family's preference since is close to them and would allow daily visits)   Code Status: Full Family Communication: Stepmother [name] father at bedside on 12/17 DVT prophylaxis: 12/15 consistently ambulatory therefore will discontinue Lovenox Vaccination status: Family confirmed initial Pfizer Covid vaccine dose given in June, second dose given on 11/2  Foley catheter: No  HPI: 34 year old female with known history of substance abuse on prior methadone, active hepatitis C and gestational diabetes who presented initially with confusion.  She was initially admitted to [facility] in September with a diagnosis of encephalopathy presumed secondary to drug overdose.  Subsequent work-up inconclusive.  LP without signs of infection, MRI brain unremarkable and unfortunately she remained encephalopathic.  EEG did reveal focal seizures.  AED therapy was initiated and seizures were suppressed and she was transferred to [facility]  for continuous EEG.  Apparently had episodes of status epilelepticus before full suppression of seizures with medications.  Subsequently neurological recovery was poor and delayed.  Neurology suspicious for autoimmune encephalitis and was given high-dose steroids with equivocal improvement but deteriorated after steroids completed.  Was given IVIG without any significant improvement in mentation.  For the past several months she has had waxing and waning mentation ranging from unresponsive to alert confused and verbal and at times restless and agitated.  When she is awake/alert she is able to ambulate but requires assistance due to impulsivity and gait instability.  Unfortunately these episodes of wakefulness were never sustained.  During the last week of November the decision was made to taper and discontinue all psychotropic medications due to lack of response and altered mentation.  Once this was accomplished patient became very alert and agitated but remained confused. Over time with adjustment of psychotropic medications patient remained awake, was able to feed herself and ambulate with minimal assistance.  She has demonstrated issues with impulsivity, anxiety and disinhibition including hypersexuality.  Currently symptoms are managed with Seroquel, Depakote, naltrexone, and Vistaril.  Remains inconsistent with verbal communication noting at times she is almost mute with severe flat affect versus being extremely appropriate and as her family describes "back to being [place] again".  Patient is able to write out her thoughts and feelings in a notebook.  Most of the writings are incoherent at times and her sentence structure appears more consistent with gibberish as opposed to true communication.  Her best responses are simple 1-2 word responses/ phrases.  She appears to be extremely depressed. She perseverates on not having custody of her children and is confused believing that this is an acute event that is  occurring now as opposed to something that had occurred several years prior to current admission. She has written phrases such as "I am a dumbass" and "please forgive me God" but does not have enough insight to elaborate when further questioned.  The palliative care and psychiatric teams concurrently evaluated the patient on 12/14 continue to encourage her to write down her thoughts and feelings.    In regards to her mission history of status epilepticus in the context of no prior history of epilepsy, Dr. Melynda Ripple with neurology was contacted regarding the possibility of weaning and discontinuing antiepileptic medications. She has had no further seizures for greater than 2 months. Neurology recommendation is to begin slowly tapering and discontinuing antiepileptic drugs monitoring for any reemergence of seizure activity.   PEG tube has been discontinued.  Patient is eating consistently and taking meds by mouth without difficulty.  Although her mentation has not returned to her preadmission baseline there is no indication at this juncture to continue safety sitter even with her underlying impulsivity.  She is able to ambulate without difficulty and maintained steady appropriate gait.  She is not a fall risk at this juncture.  She is not requiring safety restraints.  Subjective: Upon my initial evaluation of patient this morning she was very agitated and confused, was talking to individuals not present in the room. Asked me to take her for a walk but then was having difficulty focusing on walking. Decision was made to give one-time dose of Geodon IM since patient has been having worsening of behaviors over the past several days since Seroquel dosage adjusted for contributing to depression symptoms.  After receiving the IM Geodon, 1 hour later nurse documents that the patient was calm and sitting on the bed eating grits and communicating verbally and through writing. I returned to her room around 10:45 AM and she  was sleeping soundly noting I had also increased her Vistaril and given her one-time dose of Klonopin. Based on nursing documentation it appears patient has had insomnia and has not been sleeping well at night either.  1300; patient.  She was sitting up in bed eating chocolate ice cream.  She was alert and oriented x3.  She was able to carry on a comprehensive conversation and answer questions appropriately.  She does remember me coming by earlier today to see her and perform an examination.  Discussed change over to Geodon and she feels this is helping.  She states she had been on Thorazine previously but this gave her "the Thorazine shuffle" so this medication had previously been discontinued.  Hopeful on lower dose Geodon along with carbamazepine her symptoms will be controlled without EPS symptoms.  Objective: Vitals:   04/11/20 2029 04/12/20 0609  BP: 113/79 110/75  Pulse: (!) 102 94  Resp: 18 18  Temp: 98 F (36.7 C) (!) 97.4 F (36.3 C)  SpO2: 100% 100%    Intake/Output Summary (Last 24 hours) at 04/12/2020 1038 Last data filed at 04/12/2020 0453 Gross per 24 hour  Intake 960 ml  Output 600 ml  Net 360 ml   Filed Weights   03/29/20 0500 04/04/20 1610 04/05/20  [id number]  Weight: 72.5 kg 70.9 kg 71.6 kg    Exam: General: Awake initially restless-after receiving medications has returned to baseline level of calm Pulmonary: Bilateral lung sounds clear to auscultation posteriorly, stable on room air Cardiac: S1 S2 without murmurs, no tachycardia, no peripheral edema Abdomen: Soft, nontender nondistended. Eating well and tolerating diet when neurological and psychological status more stable. Neurological: Moves all extremities x4, no focal neurological deficits, no tremors Psychiatric: Awake, very restless this morning. She is confused and was speaking to people who were not in the room. She was initially avoiding eye contact and was unable to write out thoughts and notebook. After  receiving IM Geodon as well as Klonopin for anxiety patient returned to baseline mentation as documented above. She was calm, able to feed self and able to communicate verbally and through written word.   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 Unfortunately her brain injury sequela (agitation, confusion and hallucinations) have reemerged on lower dose of Seroquel therefore decision made to discontinue in favor of PO Geodon. In the past after receiving IM Geodon patient became calm with improved orientation, able to perform ADLs independently and respond to questions verbally/by written word  -Although patient requested Ambien to help with sleep it appears to have increased agitation so this has been discontinued in favor of melatonin-trazodone 50 mg added at at bedtime on 12/20 -Follow-up EEGs x 2 neg-Continue AED taper: Vimpat (LD completed as of 12/21). We will begin Dilantin taper today starting with 100 mg twice daily x3 days then decrease to 50 mg twice daily x3 days. When this taper is completed we will taper phenobarbital -Continue naltrexone for hyper sexualization (patient with history of PTSD and narcotic abuse prior to admission). -Continue Tegretol increase dose to 400 mg twice daily behavioral disturbances, agitation and impulsivity (this will also function as an AED if needed) -Increase Vistaril from 10 mg to 25 mg three times daily. If oversedation occurs can decrease dose  D/w Pam/CIR- rec [facility] if able to be dc'd home  Back pain 2/2 abnormal urinalysis consistent with UTI -Patient with low-grade fever overnight and is now having  back pain for days not responsive to NSAIDs and muscle relaxers -DG lumbar spine unremarkable -During hospitalization patient has been treated for pansensitive E. coli UTI as well as Salmonella UTI both of which have been sensitive to Levaquin -Completed Levaquin 12/20-urine culture obtained after antibiotics initiated and showed no growth -CT abdomen and pelvis on 12/17 unremarkable  Hyperglycemia secondary to medication -Resolved -Hemoglobin A1c 5.8 consistent with prediabetes -Antipsychotics such as Seroquel and Zyprexa can cause hyperglycemia and drug-related diabetes mellitus we will continue to follow closely; patient has been having increased thirst and increased urination  Acute on chronic depression prior to admission/known polysubstance abuse/suicidal ideation -Attempted to maximize depression therapy but seem to make patient's mentation worse and did not improve symptoms therefore Zoloft 100 mg and Abilify 2 mg discontinued after being tapered -Seroquel contributed to depression and suicidal ideation but unfortunately with lower doses brain injury sequela reemerged therefore Seroquel discontinued in favor of Geodon as described above. -Reexamined by palliative medicine and psychiatry on 12/14.  Please see their notes for details.  Patient agreeable to outpatient  psych follow-up well is consideration for outpatient ECT if indicated/candidate.  This has been discussed with patient's family i.e. her stepmother and father and if patient stable enough to discharge to home environment they have planned on pursuing this option.        Other problems: Nonobstructive transaminitis in context of hepatitis C -Elevated HCV antibody with markedly elevated HCV RNA quantitative level  consistent with chronic hepatitis C  -LFTs remain stable and continue to slightly trend down without an obstructive pattern noted -11/10: Discussed with ID Dr. Manson Passey.  Treatment of hepatitis C typically is  initiated in the outpatient setting.  Medications can be quite expensive and usually take time to obtain insurance approval.   -ID recommends scheduling outpatient appointment their clinic closer to discharge date  ? Breast enlargement/gynecomastia -No clinical evidence of breast enlargement -Prolactin level low with normal FSH -Appearance of breast enlargement reported by family likely related to improve nutrition as well as abdominal binder moving up underneath breasts and malpositioning of breasts.  Diarrhea>>>Constipation Resolved Felt secondary to laxatives but given Salmonella UTI could have been related to Salmonella although at the time diarrhea was being worked up patient had no leukocytosis, no abdominal pain and no fevers.  Suspected oral Candida -Resolved after oral nystatin  Low back pain -Resolved -Completed 5 days of ibuprofen -Continue Voltaren gel and add as needed Flexeril (will DC Robaxin since there is to be ineffective)  Salmonella UTI -Completed amoxicillin  Physical deconditioning -Resolved -Does continue to exhibit impulsivity but maintains appropriate balance and gait no longer requires one-to-one safety sitter  Large hemorrhoids. -Resolved -Markedly improved-LD Proctofoam 12/15  Dysphagia -Resolved -Continue regular diet    Data Reviewed: Basic Metabolic Panel: No results for input(s): NA, K, CL, CO2, GLUCOSE, BUN, CREATININE, CALCIUM, MG, PHOS in the last 168 hours. Liver Function Tests: No results for input(s): AST, ALT, ALKPHOS, BILITOT, PROT, ALBUMIN in the last 168 hours. No results for input(s): LIPASE, AMYLASE in the last 168 hours. No results for input(s): AMMONIA in the last 168 hours. CBC: No results for input(s): WBC, NEUTROABS, HGB, HCT, MCV, PLT in the last 168 hours. Cardiac Enzymes: No results for input(s): CKTOTAL, CKMB, CKMBINDEX, TROPONINI in the last 168 hours. BNP (last 3 results) No results for input(s): BNP in the last  8760 hours.  ProBNP (last 3 results) No results for input(s): PROBNP in the last 8760 hours.  CBG: No results for input(s): GLUCAP in the last 168 hours.  Recent Results (from the past 240 hour(s))  Urine Culture     Status: None   Collection Time: 04/08/20  9:32 AM   Specimen: Urine, Clean Catch  Result Value Ref Range Status   Specimen Description URINE, CLEAN CATCH  Final   Special Requests Normal  Final   Culture   Final    NO GROWTH Performed at [facility], [address], [place], [place] [address]    Report Status 04/09/2020 FINAL  Final     Studies: No results found.  Scheduled Meds: . carbamazepine  400 mg Oral BID  . Chlorhexidine Gluconate Cloth  6 each Topical Daily  . cyclobenzaprine  7.5 mg Oral TID  . diclofenac Sodium  4 g Topical QID  . feeding supplement  237 mL Oral TID BM  . Gerhardt's butt cream  1 application Topical TID  . hydrOXYzine  25 mg Oral TID  . melatonin  5 mg Oral QHS  . multivitamin with minerals  1 tablet Oral  Daily  . naltrexone  50 mg Oral Daily  . phenobarbital  64.8 mg Oral BID  . phenytoin  100 mg Oral BID   Followed by  . [START ON 04/15/2020] phenytoin  50 mg Oral BID  . psyllium  1 packet Oral BID  . vitamin B-6  100 mg Oral Daily  . thiamine  100 mg Oral Daily  . traZODone  50 mg Oral QHS  . ziprasidone  20 mg Oral BID WC   Continuous Infusions: . sodium chloride      Principal Problem:   Refractory seizure (HCC) Active Problems:   Acute metabolic encephalopathy   Hypokalemia   Polysubstance abuse (HCC)   Elevated CK   Transaminitis   Chronic hepatitis C without hepatic coma (HCC)   Distended abdomen   Palliative care encounter   Colonic Ileus (HCC)   Organic brain syndrome   On enteral nutrition   Physical deconditioning   Protein-calorie malnutrition (HCC)   Inadequate oral nutritional intake   Impulse disorder   Consultants:  Neurology  Psychiatry  Interventional  radiology  Surgery  Procedures:  9/14 lumbar puncture  9/15 EEG  9/16 EEG  9/17 EEG  9/19 overnight EEG with video  9/22 overnight EEG with video with discontinuation of long-term EEG monitoring on 9/25  9/24 core track placement  10/6 EEG  12/15 PEG tube removed  Antibiotics: Anti-infectives (From admission, onward)   Start     Dose/Rate Route Frequency Ordered Stop   04/05/20 1415  levofloxacin (LEVAQUIN) tablet 500 mg        500 mg Oral Daily 04/05/20 1327 04/11/20 0911   03/19/20 1000  metroNIDAZOLE (FLAGYL) tablet 500 mg  Status:  Discontinued        500 mg Per Tube 2 times daily 03/19/20 0822 03/23/20 0827   03/18/20 1200  amoxicillin (AMOXIL) 250 MG/5ML suspension 500 mg  Status:  Discontinued        500 mg Per Tube Every 8 hours 03/18/20 0915 03/23/20 0559   03/17/20 1200  cefTRIAXone (ROCEPHIN) 1 g in sodium chloride 0.9 % 100 mL IVPB  Status:  Discontinued        1 g 200 mL/hr over 30 Minutes Intravenous Every 24 hours 03/17/20 1048 03/18/20 0913   03/16/20 1130  metroNIDAZOLE (FLAGYL) 50 mg/ml oral suspension 500 mg  Status:  Discontinued        500 mg Per Tube 2 times daily 03/16/20 1040 03/19/20 0821   03/16/20 1130  fluconazole (DIFLUCAN) 40 MG/ML suspension 152 mg        150 mg Per Tube  Once 03/16/20 1040 03/16/20 1245   02/11/20 1526  ceFAZolin (ANCEF) IVPB 2g/100 mL premix        2 g 200 mL/hr over 30 Minutes Intravenous  Once 02/11/20 1526 02/11/20 1641   02/05/20 0600  ceFAZolin (ANCEF) IVPB 2g/100 mL premix        2 g 200 mL/hr over 30 Minutes Intravenous To Short Stay 02/04/20 1054 02/05/20 0950   01/29/20 0600  ceFAZolin (ANCEF) IVPB 2g/100 mL premix        2 g 200 mL/hr over 30 Minutes Intravenous On call to O.R. 01/28/20 1511 01/30/20 0559   01/28/20 2000  cefTRIAXone (ROCEPHIN) 1 g in sodium chloride 0.9 % 100 mL IVPB        1 g 200 mL/hr over 30 Minutes Intravenous Every 24 hours 01/28/20 1925 02/02/20 0724   01/06/20 0900  cefTRIAXone  (ROCEPHIN) 2 g  in sodium chloride 0.9 % 100 mL IVPB  Status:  Discontinued        2 g 200 mL/hr over 30 Minutes Intravenous Every 12 hours 01/06/20 0105 01/06/20 2202   01/06/20 0800  vancomycin (VANCOCIN) IVPB 1000 mg/200 mL premix  Status:  Discontinued       "Followed by" Linked Group Details   1,000 mg 200 mL/hr over 60 Minutes Intravenous Every 12 hours 01/05/20 1904 01/06/20 2202   01/05/20 2000  vancomycin (VANCOREADY) IVPB 1500 mg/300 mL       "Followed by" Linked Group Details   1,500 mg 150 mL/hr over 120 Minutes Intravenous  Once 01/05/20 1904 01/06/20 0127   01/05/20 1830  cefTRIAXone (ROCEPHIN) 2 g in sodium chloride 0.9 % 100 mL IVPB        2 g 200 mL/hr over 30 Minutes Intravenous  Once 01/05/20 1829 01/05/20 2220       Time spent: 20 minutes    [name] ANP  [facility] Pager [phone].  04/12/2020, 10:38 AM  LOS: 97 days

## 2020-04-12 NOTE — Progress Notes (Signed)
Pt anxious, agitated, wandering, constant full body movements at 0700.  Administered geodon IM and PO and dialntin at 0900. At 0956, pt calm, sitting on bed, eating grits, communicating verbally and through writing.

## 2020-04-12 NOTE — Progress Notes (Signed)
Nutrition Follow-up  DOCUMENTATION CODES:   Not applicable  INTERVENTION:   -Continue Ensure Enlive po TID, each supplement provides 350 kcal and 20 grams of protein -Continue MVI with minerals daily -Continue Magic cup TID with meals, each supplement provides 290 kcal and 9 grams of protein  NUTRITION DIAGNOSIS:   Inadequate oral intake related to lethargy/confusion as evidenced by meal completion < 25%.  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 PMH includes hepatitis and polysubstance abuse.   9/24 Cortrak placed (gastric) 10/10 Cortrak dislodged, TF held 10/11 Cortrak advanced (tip in proximal duodenum per KUB), TF restarted 10/14 TF reduced to 34ml/hr due to colonic ileus 10/15 G-tube placement 10/16 TF resumed 10/18 TF rate returned to 29ml/hr; G-tube pulled out 1 inch so TF held 10/21 G-tube replaced 10/27 diet advanced to regular 12/15 PEG tube removed  Reviewed I/O's: +100 ml x 24 hours and +8 L since 03/29/20  UOP: 1.1 L x 24 hours  Pt continues with erratic intake. Noted meal completion 15-70%. Pt is accepting Ensure supplements.   Medications reviewed ands include melatonin, vitamin B-6, and thiamine.   Per MD notes, pt with lack of safe discharge plan. Per TOC notes, attempting to find SNF placement.   Labs reviewed.   Diet Order:   Diet Order            Diet regular Room service appropriate? Yes; Fluid consistency: Thin  Diet effective now                 EDUCATION NEEDS:   No education needs have been identified at this time  Skin:  Skin Assessment: Skin Integrity  Issues: Skin Integrity Issues:: Incisions Incisions: lt upper abdomen  Last BM:  04/11/20  Height:   Ht Readings from Last 1 Encounters:  01/06/20 5\' 5"  (1.651 m)    Weight:   Wt Readings from Last 1 Encounters:  04/05/20 71.6 kg   BMI:  Body mass index is 26.27 kg/m.  Estimated Nutritional Needs:   Kcal:  1800-2000  Protein:  90-100 grams  Fluid:  >/=1.8L/d    04/07/20, RD, LDN, CDCES Registered Dietitian II Certified Diabetes Care and Education Specialist Please refer to [facility] for RD and/or RD on-call/weekend/after hours pager

## 2020-04-13 ENCOUNTER — Inpatient Hospital Stay (HOSPITAL_COMMUNITY): Payer: Medicaid Other

## 2020-04-13 LAB — BASIC METABOLIC PANEL
Anion gap: 8 (ref 5–15)
BUN: 10 mg/dL (ref 6–20)
CO2: 29 mmol/L (ref 22–32)
Calcium: 8.8 mg/dL — ABNORMAL LOW (ref 8.9–10.3)
Chloride: 102 mmol/L (ref 98–111)
Creatinine, Ser: 0.59 mg/dL (ref 0.44–1.00)
GFR, Estimated: >60 mL/min (ref >60)
Glucose, Bld: 164 mg/dL — ABNORMAL HIGH (ref 70–99)
Potassium: 3.9 mmol/L (ref 3.5–5.1)
Sodium: 139 mmol/L (ref 135–145)

## 2020-04-13 NOTE — Progress Notes (Addendum)
[facility] PROGRESS NOTE  Anita Davis ZOX:[id number] DOB: 01-02-1986 DOA: 01/04/2020 PCP: Jacquelin HawkingMcElroy, Shannon, PA-C              03/30/20                         04/08/20    Status: Remains inpatient appropriate because:Altered mental status and Unsafe d/c plan  Dispo:  The patient is from: Home              Anticipated d/c is to: SNF vs home with family.  Extensive conversation had with patient's stepmother Bonita QuinLinda.  She has been in contact with [place] that apparently accept patients with neuro psychiatric issues.  It is best for patient if we are able to locate a facility that is close to family so they can visit her daily              Anticipated d/c date is: > 3 days              Patient currently is medically stable to d/c.  Barrier to discharge:SNF placement at a facility that can accommodate behavioral issues similar to dementia patients, Medicaid and disability applications in process.[facility] reviewing chart (family's preference since is close to them and would allow daily visits)   Code Status: Full Family Communication: Stepmother Bonita QuinLinda father at bedside on 12/17 DVT prophylaxis: 12/15 consistently ambulatory therefore will discontinue Lovenox Vaccination status: Family confirmed initial Pfizer Covid vaccine dose given in June, second dose given on 11/2  Pertinent social history Patient has 3 children: [name], 42[name] and [name] [name]'s father has primary custody and no one in the family knows where they reside With permission from patient's father and stepmother I contacted Charlean MerlLeslie Lowe (telephone number [phone]) who has legal custody of [name] and [name]; at this juncture Mrs. Rana SnareLowe states that after extensive discussions with [name] he no longer wishes to visit his mother in person.  [name] is only 7 and it is not appropriate for him to visit his mother at this point.  Prior to the patient's acute illness Mrs. Rana SnareLowe  states that Marchelle Folksmanda had regular frequent supervised visits with these children.  The possibility of children recording video greetings to their mother has been discussed and she will need to discuss this with her husband before agreeing. Mrs. Rana SnareLowe is agreeable to keeping a steady line of communication open with the patient's stepmother Brynda RimLinda Ploughman After multiple conversations with other family members patient's aunt Jeanice LimKatie Clinard is no longer allowed on the visitation list and is not to be given any information patient status  Foley catheter: No  HPI: 34 year old female with known history of substance abuse on prior methadone, active hepatitis C and gestational diabetes who presented initially with confusion.  She was initially admitted to [facility] in September with a diagnosis of encephalopathy presumed secondary to drug overdose.  Subsequent work-up inconclusive.  LP without signs of infection, MRI brain unremarkable and unfortunately she remained encephalopathic.  EEG did reveal focal seizures.  AED therapy was initiated and seizures were suppressed and [facility].  Apparently had episodes of status epilelepticus before full suppression of seizures with medications.  Subsequently neurological recovery was poor and delayed.  Neurology suspicious for autoimmune encephalitis and was given high-dose steroids with equivocal improvement but deteriorated after steroids completed.  Was given IVIG without any significant improvement in mentation.  For the past several months she has had waxing and waning mentation ranging from unresponsive to alert confused and verbal and at times restless and agitated.  When she is awake/alert she is able to ambulate but requires assistance due to impulsivity and gait instability.  Unfortunately these episodes of wakefulness were never sustained.  During the last week of November the decision was made to taper and discontinue  all psychotropic medications due to lack of response and altered mentation.  Once this was accomplished patient became very alert and agitated but remained confused. Over time with adjustment of psychotropic medications patient remained awake, was able to feed herself and ambulate with minimal assistance.  She has demonstrated issues with impulsivity, anxiety and disinhibition including hypersexuality.  Currently symptoms are managed with Seroquel, Depakote, naltrexone, and Vistaril.  Remains inconsistent with verbal communication noting at times she is almost mute with severe flat affect versus being extremely appropriate and as her family describes "back to being [place] again".  Patient is able to write out her thoughts and feelings in a notebook.  Most of the writings are incoherent at times and her sentence structure appears more consistent with gibberish as opposed to true communication.  Her best responses are simple 1-2 word responses/ phrases.  She appears to be extremely depressed. She perseverates on not having custody of her children and is confused believing that this is an acute event that is occurring now as opposed to something that had occurred several years prior to current admission. She has written phrases such as "I am a dumbass" and "please forgive me God" but does not have enough insight to elaborate when further questioned.  The palliative care and psychiatric teams concurrently evaluated the patient on 12/14 continue to encourage her to write down her thoughts and feelings.    In regards to her mission history of status epilepticus in the context of no prior history of epilepsy, Dr. Melynda Ripple with neurology was contacted regarding the possibility of weaning and discontinuing antiepileptic medications. She has had no further seizures for greater than 2 months. Neurology recommendation is to begin slowly tapering and discontinuing antiepileptic drugs monitoring for any reemergence of seizure  activity.   PEG tube has been discontinued.  Patient is eating consistently and taking meds by mouth without difficulty.  Although her mentation has not returned to her preadmission baseline there is no indication at this juncture to continue safety sitter even with her underlying impulsivity.  She is able to ambulate without difficulty and maintained steady appropriate gait.  She is not a fall risk at this juncture.  She is not requiring safety restraints.  Subjective: Restless, anxious and emotional.  Able to communicate via combination of short phrases spoken word as well as written word.  Confused.  Objective: Vitals:   04/12/20 1948 04/13/20 0559  BP: (!) 109/98 110/69  Pulse: (!) 102 100  Resp: 18 17  Temp: 98.5 F (36.9 C) 98.8 F (37.1 C)  SpO2: 98% 99%    Intake/Output Summary (Last 24 hours) at 04/13/2020 1230 Last data filed at 04/13/2020 0745 Gross per 24 hour  Intake 840 ml  Output --  Net 840 ml   Filed Weights   03/29/20 0500 04/04/20 0632 04/05/20 0613  Weight: 72.5 kg 70.9 kg 71.6 kg    Exam: General: Awake, restless and confused.  Had just received Vistaril for anxiety. Pulmonary: Bilateral lung sounds clear to auscultation posteriorly, stable on room air Cardiac: S1 S2 without murmurs, no tachycardia, no  peripheral edema Abdomen: Soft, nontender nondistended. Eating well and tolerating diet when neurological and psychological status stable. Neurological: Moves all extremities x4, no focal neurological deficits, no tremors Psychiatric: Awake, + emotional.  Remains confused.  Focused on children.  Keeps stating "you promised" in regards to being able to see her children.   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 Unfortunately her brain injury sequela (agitation, confusion and hallucinations) have reemerged on lower dose of Seroquel therefore decision made to discontinue in favor of PO Geodon. In the past after receiving IM Geodon patient became calm with improved orientation, able to perform ADLs independently and respond to questions verbally/by written word  -Although patient requested Ambien to help with sleep it appears to have increased agitation so this has been discontinued in favor of melatonin-trazodone 50 mg added at at bedtime on 12/20 -Follow-up EEGs x 2 neg-Continue AED taper: Vimpat (LD completed as of 12/21). Dilantin taper initiated 12/21 starting with 100 mg twice daily x3 days then decrease to 50 mg twice daily x3 days. When this taper is completed we will taper phenobarbital -Continue naltrexone for hyper sexualization (patient with history of PTSD and narcotic abuse prior to admission). -Continue Tegretol increase dose to 400 mg twice daily behavioral disturbances, agitation and impulsivity (this will also function as an AED if needed) -Increase Vistaril from 10 mg to 25 mg three times daily. If oversedation occurs can decrease dose  D/w Pam/CIR- rec having pt fu with the [facility] if able to be dc'd home  Dental pain -Orthopantogram obtained on 12/22 days as well as periapical lucency involving the residual roots of the left second mandibular molar with associated large dental caries.  CT with contrast recommended if concern over soft tissue involvement -Patient apparently had reported some waxing and waning dental bleeding - will obtain CT  Hyperglycemia secondary to medication -Resolved -Hemoglobin A1c 5.8 consistent with prediabetes -Antipsychotics such as Seroquel and Zyprexa can cause hyperglycemia and drug-related diabetes mellitus we will continue to follow closely; patient has been having  increased thirst and increased urination  Acute on chronic depression prior to admission/known polysubstance abuse/suicidal ideation -Attempted to maximize depression therapy but seem to make patient's mentation worse and did not improve symptoms therefore Zoloft 100 mg and Abilify 2 mg discontinued after being tapered -Seroquel contributed to depression and suicidal ideation but unfortunately with lower doses brain injury sequela reemerged therefore Seroquel discontinued in favor of Geodon as described above. -Reexamined by palliative medicine and psychiatry on 12/14.  Please see their notes for details.  Patient agreeable to outpatient psych follow-up well is consideration for outpatient ECT if indicated/candidate.  This has been discussed with patient's family i.e. her stepmother and father and if patient stable enough to discharge to home environment they have planned on pursuing this option.        Other problems: Nonobstructive transaminitis in context of hepatitis C -Elevated HCV antibody with markedly elevated HCV RNA quantitative level  consistent with chronic hepatitis C  -LFTs remain stable and continue to slightly trend down without an obstructive pattern noted -11/10: Discussed with ID Dr. Manson Passey.  Treatment of hepatitis C typically is initiated in the outpatient setting.  Medications can be quite expensive and usually take time to  obtain insurance approval.   -ID recommends scheduling outpatient appointment their clinic closer to discharge date  Back pain 2/2 abnormal urinalysis consistent with UTI -Patient with low-grade fever overnight and is now having back pain for days not responsive to NSAIDs and muscle relaxers -DG lumbar spine unremarkable -During hospitalization patient has been treated for pansensitive E. coli UTI as well as Salmonella UTI both of which have been sensitive to Levaquin -Completed Levaquin 12/20-urine culture obtained after antibiotics initiated and  showed no growth -CT abdomen and pelvis on 12/17 unremarkable  ? Breast enlargement/gynecomastia -No clinical evidence of breast enlargement -Prolactin level low with normal FSH -Appearance of breast enlargement reported by family likely related to improve nutrition as well as abdominal binder moving up underneath breasts and malpositioning of breasts.  Diarrhea>>>Constipation Resolved Felt secondary to laxatives but given Salmonella UTI could have been related to Salmonella although at the time diarrhea was being worked up patient had no leukocytosis, no abdominal pain and no fevers.  Suspected oral Candida -Resolved after oral nystatin  Low back pain -Resolved -Completed 5 days of ibuprofen -Continue Voltaren gel and add as needed Flexeril (will DC Robaxin since there is to be ineffective)  Salmonella UTI -Completed amoxicillin  Physical deconditioning -Resolved -Does continue to exhibit impulsivity but maintains appropriate balance and gait no longer requires one-to-one safety sitter  Large hemorrhoids. -Resolved -Markedly improved-LD Proctofoam 12/15  Dysphagia -Resolved -Continue regular diet    Data Reviewed: Basic Metabolic Panel: Recent Labs  Lab 04/13/20 0142  NA 139  K 3.9  CL 102  CO2 29  GLUCOSE 164*  BUN 10  CREATININE 0.59  CALCIUM 8.8*   Liver Function Tests: No results for input(s): AST, ALT, ALKPHOS, BILITOT, PROT, ALBUMIN in the last 168 hours. No results for input(s): LIPASE, AMYLASE in the last 168 hours. No results for input(s): AMMONIA in the last 168 hours. CBC: No results for input(s): WBC, NEUTROABS, HGB, HCT, MCV, PLT in the last 168 hours. Cardiac Enzymes: No results for input(s): CKTOTAL, CKMB, CKMBINDEX, TROPONINI in the last 168 hours. BNP (last 3 results) No results for input(s): BNP in the last 8760 hours.  ProBNP (last 3 results) No results for input(s): PROBNP in the last 8760 hours.  CBG: No results for input(s):  GLUCAP in the last 168 hours.  Recent Results (from the past 240 hour(s))  Urine Culture     Status: None   Collection Time: 04/08/20  9:32 AM   Specimen: Urine, Clean Catch  Result Value Ref Range Status   Specimen Description URINE, CLEAN CATCH  Final   Special Requests Normal  Final   Culture   Final    NO GROWTH Performed at [facility], [address], [place], [place] [address]    Report Status 04/09/2020 FINAL  Final     Studies: No results found.  Scheduled Meds: . carbamazepine  400 mg Oral BID  . Chlorhexidine Gluconate Cloth  6 each Topical Daily  . cyclobenzaprine  7.5 mg Oral TID  . diclofenac Sodium  4 g Topical QID  . feeding supplement  237 mL Oral TID BM  . Gerhardt's butt cream  1 application Topical TID  . melatonin  5 mg Oral QHS  . multivitamin with minerals  1 tablet Oral Daily  . naltrexone  50 mg Oral Daily  . phenobarbital  64.8 mg Oral BID  . phenytoin  100 mg Oral BID   Followed by  . [START ON 04/15/2020] phenytoin  50 mg Oral BID  . psyllium  1 packet Oral BID  . vitamin B-6  100 mg Oral Daily  . thiamine  100 mg Oral Daily  . traZODone  50 mg Oral QHS  . ziprasidone  20 mg Oral BID WC   Continuous Infusions: . sodium chloride      Principal Problem:   Refractory seizure (HCC) Active Problems:   Acute metabolic encephalopathy   Hypokalemia   Polysubstance abuse (HCC)   Elevated CK   Transaminitis   Chronic hepatitis C without hepatic coma (HCC)   Distended abdomen   Palliative care encounter   Colonic Ileus (HCC)   Organic brain syndrome   On enteral nutrition   Physical deconditioning   Protein-calorie malnutrition (HCC)   Inadequate oral nutritional intake   Impulse disorder   Consultants:  Neurology  Psychiatry  Interventional radiology  Surgery  Procedures:  9/14 lumbar puncture  9/15 EEG  9/16 EEG  9/17 EEG  9/19 overnight EEG with video  9/22 overnight EEG with video with discontinuation of  long-term EEG monitoring on 9/25  9/24 core track placement  10/6 EEG  12/15 PEG tube removed  Antibiotics: Anti-infectives (From admission, onward)   Start     Dose/Rate Route Frequency Ordered Stop   04/05/20 1415  levofloxacin (LEVAQUIN) tablet 500 mg        500 mg Oral Daily 04/05/20 1327 04/11/20 0911   03/19/20 1000  metroNIDAZOLE (FLAGYL) tablet 500 mg  Status:  Discontinued        500 mg Per Tube 2 times daily 03/19/20 0822 03/23/20 0827   03/18/20 1200  amoxicillin (AMOXIL) 250 MG/5ML suspension 500 mg  Status:  Discontinued        500 mg Per Tube Every 8 hours 03/18/20 0915 03/23/20 0559   03/17/20 1200  cefTRIAXone (ROCEPHIN) 1 g in sodium chloride 0.9 % 100 mL IVPB  Status:  Discontinued        1 g 200 mL/hr over 30 Minutes Intravenous Every 24 hours 03/17/20 1048 03/18/20 0913   03/16/20 1130  metroNIDAZOLE (FLAGYL) 50 mg/ml oral suspension 500 mg  Status:  Discontinued        500 mg Per Tube 2 times daily 03/16/20 1040 03/19/20 0821   03/16/20 1130  fluconazole (DIFLUCAN) 40 MG/ML suspension 152 mg        150 mg Per Tube  Once 03/16/20 1040 03/16/20 1245   02/11/20 1526  ceFAZolin (ANCEF) IVPB 2g/100 mL premix        2 g 200 mL/hr over 30 Minutes Intravenous  Once 02/11/20 1526 02/11/20 1641   02/05/20 0600  ceFAZolin (ANCEF) IVPB 2g/100 mL premix        2 g 200 mL/hr over 30 Minutes Intravenous To Short Stay 02/04/20 1054 02/05/20 0950   01/29/20 0600  ceFAZolin (ANCEF) IVPB 2g/100 mL premix        2 g 200 mL/hr over 30 Minutes Intravenous On call to O.R. 01/28/20 1511 01/30/20 0559   01/28/20 2000  cefTRIAXone (ROCEPHIN) 1 g in sodium chloride 0.9 % 100 mL IVPB        1 g 200 mL/hr over 30 Minutes Intravenous Every 24 hours 01/28/20 1925 02/02/20 0724   01/06/20 0900  cefTRIAXone (ROCEPHIN) 2 g in sodium chloride 0.9 % 100 mL IVPB  Status:  Discontinued        2 g 200 mL/hr over 30 Minutes Intravenous Every 12 hours 01/06/20 0105 01/06/20 2202   01/06/20  0800   vancomycin (VANCOCIN) IVPB 1000 mg/200 mL premix  Status:  Discontinued       "Followed by" Linked Group Details   1,000 mg 200 mL/hr over 60 Minutes Intravenous Every 12 hours 01/05/20 1904 01/06/20 2202   01/05/20 2000  vancomycin (VANCOREADY) IVPB 1500 mg/300 mL       "Followed by" Linked Group Details   1,500 mg 150 mL/hr over 120 Minutes Intravenous  Once 01/05/20 1904 01/06/20 0127   01/05/20 1830  cefTRIAXone (ROCEPHIN) 2 g in sodium chloride 0.9 % 100 mL IVPB        2 g 200 mL/hr over 30 Minutes Intravenous  Once 01/05/20 1829 01/05/20 2220       Time spent: 20 minutes    [name] ANP  [facility] Pager [phone].  04/13/2020, 12:30 PM  LOS: 98 days

## 2020-04-13 NOTE — Progress Notes (Signed)
Occupational Therapy Treatment [name] MRN: [id number] DOB: [birth year] Today's Date: 04/13/2020    History of present illness 34 year old with past medical history significant for hepatitis C, polysubstance abuse brought to the hospital 9/13 for disorientation which was suspected to be substance abuse.  After she became more lucid, she was discharged, but then police brought her back later in the day and they found her passed out in a parking lot.  Urine drug screen was positive for benzodiazepine and THC. An EEG done on 9/15 showed patient was in status epilepticus.  Patient was a started on Keppra.  Despite these, EEG noted continued seizures requiring additional medications as well.  Finally on 9/18, seizures broke. Follow-up EEG on 9/20 noted evidence of epilepticity from the left central temporal region.  Repeat EEG done on 9/22 noted epileptogenicity from the left central temporal region.  Pt with PEG placed on 02/05/20.    OT comments  Pt in bed upon arrival, restless with B UEs and B LEs flailing. Pt non verbal but following verbal and physical prompts for tasks to initiate siting EOB and to stand and transfer. Pt required max A for hand over hand initiation for simple grooming tasks, UB dressing and to don slippers. OT will continue to follow acutely to maximize level of function and safety  Follow Up Recommendations  SNF    Equipment Recommendations  3 in 1 bedside commode    Recommendations for Other Services      Precautions / Restrictions Precautions Precautions: Fall Precaution Comments: seizure Restrictions Weight Bearing Restrictions: No       Mobility Bed Mobility Overal bed mobility: Modified Independent Bed Mobility: Supine to Sit;Sit to Supine     Supine to sit: Supervision Sit to supine: Supervision   General bed mobility comments: pt with quick, impulsive movements  Transfers Overall transfer level: Modified  independent Equipment used: None Transfers: Sit to/from Stand Sit to Stand: Modified independent (Device/Increase time)         General transfer comment: pt with quick, impulisve movements    Balance Overall balance assessment: Needs assistance Sitting-balance support: No upper extremity supported;Feet supported Sitting balance-Leahy Scale: Good Sitting balance - Comments: needs close Sup due to quick, impulsive movements   Standing balance support: No upper extremity supported;During functional activity Standing balance-Leahy Scale: Fair                             ADL either performed or assessed with clinical judgement   ADL Overall ADL's : Needs assistance/impaired     Grooming: Wash/dry hands;Wash/dry face;Maximal assistance;Sitting Grooming Details (indicate cue type and reason): pt initially resistive to hand over hand assist to initiate tasks         Upper Body Dressing : Supervision/safety Upper Body Dressing Details (indicate cue type and reason): doffed and donned shirt seated EOB with max multimodal cues o to initiate and complete Lower Body Dressing: Supervision/safety Lower Body Dressing Details (indicate cue type and reason): donning slippers Toilet Transfer: Min Pension scheme manager Details (indicate cue type and reason): simulated to chair, min guard A for safety           General ADL Comments: pt restless upon arrival with B UEs and B LEs flailing     Vision Patient Visual Report: No change from baseline     Perception     Praxis      Cognition Arousal/Alertness: Awake/alert Behavior During Therapy:  Restless Overall Cognitive Status: Impaired/Different from baseline Area of Impairment: Following commands;Memory;Safety/judgement                     Memory: Decreased short-term memory Following Commands: Follows one step commands consistently;Follows multi-step commands inconsistently       General Comments: pt non  verbal and not answering questions or verbally responsive to prompts        Exercises     Shoulder Instructions       General Comments      Pertinent Vitals/ Pain       Pain Assessment: Faces Pain Score: 0-No pain Pain Intervention(s): Monitored during session  Home Living                                          Prior Functioning/Environment              Frequency  Min 1X/week        Progress Toward Goals  OT Goals(current goals can now be found in the care plan section)  Progress towards OT goals: Progressing toward goals  Acute Rehab OT Goals Patient Stated Goal: none stated  Plan Discharge plan remains appropriate    Co-evaluation                 AM-PAC OT "6 Clicks" Daily Activity     Outcome Measure   Help from another person eating meals?: None Help from another person taking care of personal grooming?: A Little Help from another person toileting, which includes using toliet, bedpan, or urinal?: A Little Help from another person bathing (including washing, rinsing, drying)?: A Little Help from another person to put on and taking off regular upper body clothing?: A Little Help from another person to put on and taking off regular lower body clothing?: A Little 6 Click Score: 19    End of Session    OT Visit Diagnosis: Unsteadiness on feet (R26.81);Muscle weakness (generalized) (M62.81);Pain;Other symptoms and signs involving the nervous system (R29.898);Other symptoms and signs involving cognitive function;Cognitive communication deficit (R41.841);Ataxia, unspecified (R27.0);Feeding difficulties (R63.3);Other abnormalities of gait and mobility (R26.89)   Activity Tolerance Patient tolerated treatment well   Patient Left in bed;with bed alarm set   Nurse Communication          Time: 1093-2355 OT Time Calculation (min): 19 min  Charges: OT General Charges $OT Visit: 1 Visit OT Treatments $Self Care/Home Management  : 8-22 mins     Galen Manila 04/13/2020, 3:26 PM

## 2020-04-13 NOTE — Progress Notes (Signed)
Pt cooperative, talking at times, writing at times throughout the day.  Approximately 1 hour before and 1 hour after geodon are the most difficult times for pt to remain calm and communicate. Pt completed DG orthopantogram without incident. Awaiting CT.

## 2020-04-13 NOTE — Progress Notes (Signed)
Patient alert, and able to have full conversation. Prior part of shift would only speak small simple words. Currently alert and oriented. Safety precautions in place. Will continue to monitor.

## 2020-04-14 ENCOUNTER — Inpatient Hospital Stay (HOSPITAL_COMMUNITY): Payer: Medicaid Other

## 2020-04-14 MED ORDER — IOHEXOL 300 MG/ML  SOLN
75.0000 mL | Freq: Once | INTRAMUSCULAR | Status: AC | PRN
  Administered 2020-04-14: 03:00:00 75 mL via INTRAVENOUS

## 2020-04-14 MED ORDER — CLONAZEPAM 0.5 MG PO TABS
0.5000 mg | ORAL_TABLET | Freq: Three times a day (TID) | ORAL | Status: DC
  Administered 2020-04-14: 09:00:00 0.5 mg via ORAL
  Filled 2020-04-14: qty 1

## 2020-04-14 MED ORDER — ZIPRASIDONE HCL 40 MG PO CAPS
40.0000 mg | ORAL_CAPSULE | Freq: Two times a day (BID) | ORAL | Status: DC
Start: 2020-04-14 — End: 2020-04-15
  Administered 2020-04-14 – 2020-04-15 (×2): 40 mg via ORAL
  Filled 2020-04-14 (×2): qty 1

## 2020-04-14 MED ORDER — HYDROXYZINE HCL 50 MG/ML IM SOLN
50.0000 mg | Freq: Once | INTRAMUSCULAR | Status: AC
  Administered 2020-04-14: 13:00:00 50 mg via INTRAMUSCULAR
  Filled 2020-04-14 (×2): qty 1

## 2020-04-14 MED ORDER — CLONAZEPAM 1 MG PO TABS
1.0000 mg | ORAL_TABLET | Freq: Four times a day (QID) | ORAL | Status: DC
  Administered 2020-04-14 – 2020-04-15 (×4): 1 mg via ORAL
  Filled 2020-04-14 (×4): qty 1

## 2020-04-14 MED ORDER — HYDROXYZINE HCL 25 MG PO TABS: 25.0000 mg | ORAL_TABLET | Freq: Three times a day (TID) | ORAL | Status: DC

## 2020-04-14 MED ORDER — ZIPRASIDONE MESYLATE 20 MG IM SOLR
20.0000 mg | Freq: Once | INTRAMUSCULAR | Status: AC
  Administered 2020-04-14: 10:00:00 20 mg via INTRAMUSCULAR
  Filled 2020-04-14: qty 20

## 2020-04-14 MED ORDER — HYDROXYZINE HCL 25 MG PO TABS
50.0000 mg | ORAL_TABLET | Freq: Three times a day (TID) | ORAL | Status: DC
  Administered 2020-04-14 (×2): 50 mg via ORAL
  Filled 2020-04-14 (×3): qty 2

## 2020-04-14 MED ORDER — STERILE WATER FOR INJECTION IJ SOLN
INTRAMUSCULAR | Status: AC
  Filled 2020-04-14: qty 10

## 2020-04-14 MED ORDER — POLYETHYLENE GLYCOL 3350 17 G PO PACK
17.0000 g | PACK | Freq: Every day | ORAL | Status: DC
  Administered 2020-04-14 – 2020-04-22 (×5): 17 g via ORAL
  Filled 2020-04-14 (×8): qty 1
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
## 2020-04-14 NOTE — Plan of Care (Signed)
  Problem: Education: Goal: Verbalization of understanding the information provided will improve Outcome: Progressing   Problem: Coping: Goal: Ability to demonstrate appropriate behavior when angry will improve Outcome: Progressing Goal: Ability to identify and develop effective coping behavior will improve Outcome: Progressing Goal: Ability to interact with others will improve Outcome: Progressing   Problem: Health Behavior/Discharge Planning: Goal: Identification of resources available to assist in meeting health care needs will improve Outcome: Progressing

## 2020-04-14 NOTE — Progress Notes (Signed)
Patient really restless in bed upon entering the room, medicated with hydroxyzine (see MAR). Patient is squirming in bed and shaking head and appears to be in discomfort but is able to get cup of water to drink and was able to get applesauce and eat. Will continue to monitor and see if medication takes effect.

## 2020-04-14 NOTE — TOC Progression Note (Signed)
Transition of Care [facility]) - Progression Note    Patient Details  Name: Anita Davis MRN: [id number] Date of Birth: 03-06-1986  Transition of Care [facility] CM/SW Contact  Jimmy Picket, [place] Phone Number: 04/14/2020, 4:22 PM  Clinical Narrative:     CSW faxed referral to [facility] at [place] per families request.   Fax number [phone] Phone number [phone]  Expected Discharge Plan: Skilled Nursing Facility Barriers to Discharge: Continued Medical Work up,Inadequate or no insurance,Active Substance Use - Placement,SNF Pending payor source - LOG,SNF Pending Medicaid,SNF Pending bed offer  Expected Discharge Plan and Services Expected Discharge Plan: Skilled Nursing Facility In-house Referral: Artist   Post Acute Care Choice: Skilled Nursing Facility Living arrangements for the past 2 months: Mobile Home                                       Social Determinants of Health (SDOH) Interventions    Readmission Risk Interventions No flowsheet data found.  Jimmy Picket, Theresia Majors, [place] Clinical Social Worker [phone]

## 2020-04-14 NOTE — Progress Notes (Addendum)
[facility] PROGRESS NOTE  Doneen Poissonmanda Horen ZOX:[id number] DOB: 11-Sep-1985 DOA: 01/04/2020 PCP: Jacquelin HawkingMcElroy, Shannon, PA-C              03/30/20                         04/08/20    Status: Remains inpatient appropriate because:Altered mental status and Unsafe d/c plan  Dispo:  The patient is from: Home              Anticipated d/c is to: SNF vs home with family.  Extensive conversation had with patient's stepmother Bonita QuinLinda.  She has been in contact [place] nursing [place] that apparently accept patients with neuro psychiatric issues.  It is best for patient if we are able to locate a facility that is close to family so they can visit her daily              Anticipated d/c date is: > 3 days              Patient currently is medically stable to d/c.  Barrier to discharge:SNF placement at a facility that can accommodate behavioral issues similar to dementia patients, Medicaid and disability applications in process.[facility] [place] chart (family's preference since is close to them and would allow daily visits)   Code Status: Full Family Communication: Stepmother Bonita QuinLinda father at bedside on 12/17 DVT prophylaxis: 12/15 consistently ambulatory therefore will discontinue Lovenox Vaccination status: Family confirmed initial Pfizer Covid vaccine dose given in June, second dose given on 11/2  Pertinent social history Patient has 3 children: [name], 53[name] and [name] [name]'s father has primary custody and no one in the family knows where they reside With permission from patient's father and stepmother I contacted Charlean MerlLeslie Lowe (telephone number [phone]) who has legal custody of [name] and [name]; at this juncture Mrs. Rana SnareLowe states that after extensive discussions with [name] he no longer wishes to visit his mother in person.  [name] is only 7 and it is not appropriate for him to visit his mother at this point.  Prior to the patient's acute illness Mrs. Rana SnareLowe  states that Marchelle Folksmanda had regular frequent supervised visits with these children.  The possibility of children recording video greetings to their mother has been discussed and she will need to discuss this with her husband before agreeing. Mrs. Rana SnareLowe is agreeable to keeping a steady line of communication open with the patient's stepmother Brynda RimLinda Ploughman After multiple conversations with other family members patient's aunt Jeanice LimKatie Clinard is no longer allowed on the visitation list and is not to be given any information patient status  Foley catheter: No  HPI: 34 year old female with known history of substance abuse on prior methadone, active hepatitis C and gestational diabetes who presented initially with confusion.  She was [facility] in September with a diagnosis of encephalopathy presumed secondary to drug overdose.  Subsequent work-up inconclusive.  LP without signs of infection, MRI brain unremarkable and unfortunately she remained encephalopathic.  EEG did reveal focal seizures.  AED therapy was initiated and seizures were suppressed and [facility].  Apparently had episodes of status epilelepticus before full suppression of seizures with medications.  Subsequently neurological recovery was poor and delayed.  Neurology suspicious for autoimmune encephalitis and was given high-dose steroids with equivocal improvement but deteriorated after steroids completed.  Was given IVIG without any significant improvement in mentation.  For the past several months she has had waxing and waning mentation ranging from unresponsive to alert confused and verbal and at times restless and agitated.  When she is awake/alert she is able to ambulate but requires assistance due to impulsivity and gait instability.  Unfortunately these episodes of wakefulness were never sustained.  During the last week of November the decision was made to taper and discontinue  all psychotropic medications due to lack of response and altered mentation.  Once this was accomplished patient became very alert and agitated but remained confused. Over time with adjustment of psychotropic medications patient remained awake, was able to feed herself and ambulate with minimal assistance.  She has demonstrated issues with impulsivity, anxiety and disinhibition including hypersexuality.  Currently symptoms are managed with Seroquel, Depakote, naltrexone, and Vistaril.  Remains inconsistent with verbal communication noting at times she is almost mute with severe flat affect versus being extremely appropriate and as her family describes "back to being [place] again".  Patient is able to write out her thoughts and feelings in a notebook.  Most of the writings are incoherent at times and her sentence structure appears more consistent with gibberish as opposed to true communication.  Her best responses are simple 1-2 word responses/ phrases.  She appears to be extremely depressed. She perseverates on not having custody of her children and is confused believing that this is an acute event that is occurring now as opposed to something that had occurred several years prior to current admission. She has written phrases such as "I am a dumbass" and "please forgive me God" but does not have enough insight to elaborate when further questioned.  The palliative care and psychiatric teams concurrently evaluated the patient on 12/14 continue to encourage her to write down her thoughts and feelings.    In regards to her admission history of status epilepticus in the context of no prior history of epilepsy, Dr. Melynda Ripple with neurology was contacted regarding the possibility of weaning and discontinuing antiepileptic medications. She has had no further seizures for greater than 2 months. Neurology recommendation is to begin slowly tapering and discontinuing antiepileptic drugs monitoring for any reemergence of seizure  activity.   PEG tube has been discontinued.  Patient is eating consistently and taking meds by mouth without difficulty.  Although her mentation has not returned to her preadmission baseline there is no indication at this juncture to continue safety sitter even with her underlying impulsivity.  She is able to ambulate without difficulty and maintained steady appropriate gait.  She is not a fall risk at this juncture.  She is not requiring safety restraints.  Subjective: Very restless with minimal verbal communication.  Appears quite anxious, rhythmically moving head in a circular pattern, avoiding direct eye contact.  Occasionally shaking head no repetitively.  Purposeful rhythmic movements of body and extremities.  Asked to get out of bed to walk but then must be coaxed into walking around room and outside of room.  1-1/2 hours after patient was given IM Geodon she is sitting up in bed with legs crossed.  When spoken to becomes somewhat tearful stating that she is very anxious and feels like she is going to have a panic attack and is wondering if she could have something else for her anxiety.  Other behaviors as described above have resolved  Objective: Vitals:   04/13/20 2126 04/14/20 0428  BP: 116/75 95/75  Pulse: 89 75  Resp: 16 17  Temp: (!) 97.5 F (36.4 C) (!)  97.5 F (36.4 C)  SpO2: 100% 99%    Intake/Output Summary (Last 24 hours) at 04/14/2020 1221 Last data filed at 04/14/2020 1000 Gross per 24 hour  Intake 880 ml  Output --  Net 880 ml   Filed Weights   03/29/20 0500 04/04/20 0632 04/05/20 0613  Weight: 72.5 kg 70.9 kg 71.6 kg    Exam: General: Awake, restless and confused which markedly improved after administration of IM Geodon.   Pulmonary: Lung sounds are clear to auscultation both anteriorly and posteriorly.  Room air Cardiac: Pulse is regular, heart sounds S1-S2, no peripheral edema Abdomen: Soft, nontender nondistended. LBM 12/22 Neurological: Moves all  extremities x4, no focal neurological deficits, no tremors Psychiatric: Awake very restless earlier today with purposeful rhythmic movements and difficulty with verbal communication as described above.  1.5 hours after Geodon patient sitting up in bed with legs crossed with appropriate communication.  At present time she verbalizes extreme anxiety and is requesting medication to treat.   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 Unfortunately her brain injury sequela (agitation, confusion and hallucinations) reemerged on lower dose of Seroquel therefore decision made to discontinue in favor of PO Geodon. Typically after receiving IM Geodon patient becomes calm with improved orientation, able to perform ADLs independently and respond to questions verbally/by written word  -Patient had been started on p.o. Geodon several days ago.  12/23 will increase dose to 20 mg BID to 40 mg -Ambien worsen agitation so discontinued in favor of melatonin -12/23 will discontinue trazodone in the event it may be worsening symptoms.  Can potentially revisit this medication after Geodon dose maximized -Follow-up EEGs x 2 neg-Continue AED taper: Vimpat (LD completed as of 12/21). Dilantin taper initiated 12/21 starting with 100 mg twice daily x3 days then decrease to 50 mg twice daily x3 days. When this taper is completed we will taper phenobarbital -Continue naltrexone for hyper sexualization (patient with history of PTSD and narcotic abuse prior to admission). -Continue Tegretol increase dose to 400 mg twice daily behavioral disturbances, agitation and impulsivity (this will also function as an AED if needed) -See below regarding treatment of  anxiety with Vistaril and Klonopin D/w Pam/CIR- rec having pt fu with the [facility] if able to be dc'd home  Severe anxiety -As patient's brain injury symptoms are appropriately treated and she becomes more alert and able to communicate she is now expressing issues regarding significant anxiety and panic attack symptoms requesting pharmacotherapy -We will increase Vistaril from 25 mg scheduled TID to 50 mg; will also give Vistaril 50 mg IM x1 now -Had previously been using Klonopin 0.5 mg occasionally to treat anxiety.  We will begin Klonopin 1 mg every 6 hours scheduled.  Plan is to taper and hopefully eventually discontinue as patient Geodon dose is maximized and symptoms are better controlled  Dental pain -Orthopantogram obtained on 12/22 days as well as periapical lucency involving the residual roots of the left second mandibular molar with associated large dental caries.  CT with contrast recommended if concern over soft tissue involvement -CT without evidence of abscess  Hyperglycemia secondary to medication -Resolved -Hemoglobin A1c 5.8 consistent with prediabetes -Antipsychotics such as Seroquel and Zyprexa can cause hyperglycemia and drug-related diabetes mellitus we will continue to follow closely; patient has been having  increased thirst and increased urination  Acute on chronic depression prior to admission/known polysubstance abuse/suicidal ideation -Attempted to maximize depression therapy but seem to make patient's mentation worse and did not improve symptoms therefore Zoloft 100 mg and Abilify 2 mg discontinued after being tapered -Seroquel contributed to depression and suicidal ideation but unfortunately with lower doses brain injury sequela reemerged therefore Seroquel discontinued in favor of Geodon as described above. -Reexamined by palliative medicine and psychiatry on 12/14.  Please see their notes for details.  Patient agreeable to outpatient psych follow-up well is  consideration for outpatient ECT if indicated/candidate.  This has been discussed with patient's family i.e. her stepmother and father and if patient stable enough to discharge to home environment they have planned on pursuing this option.        Other problems: Nonobstructive transaminitis in context of hepatitis C -Elevated HCV antibody with markedly elevated HCV RNA quantitative level  consistent with chronic hepatitis C  -LFTs remain stable and continue to slightly trend down without an obstructive pattern noted -11/10: Discussed with ID Dr. Manson Passey.  Treatment of hepatitis C typically is initiated in the outpatient setting.  Medications can be quite expensive and usually take time to obtain insurance approval.   -ID recommends scheduling outpatient appointment their clinic closer to discharge date  Back pain 2/2 abnormal urinalysis consistent with UTI -Patient with low-grade fever overnight and is now having back pain for days not responsive to NSAIDs and muscle relaxers -DG lumbar spine unremarkable -During hospitalization patient has been treated for pansensitive E. coli UTI as well as Salmonella UTI both of which have been sensitive to Levaquin -Completed Levaquin 12/20-urine culture obtained after antibiotics initiated and showed no growth -CT abdomen and pelvis on 12/17 unremarkable  ? Breast enlargement/gynecomastia -No clinical evidence of breast enlargement -Prolactin level low with normal FSH -Appearance of breast enlargement reported by family likely related to improve nutrition as well as abdominal binder moving up underneath breasts and malpositioning of breasts.  Diarrhea>>>Constipation Resolved Felt secondary to laxatives but given Salmonella UTI could have been related to Salmonella although at the time diarrhea was being worked up patient had no leukocytosis, no abdominal pain and no fevers.  Suspected oral Candida -Resolved after oral nystatin  Low back  pain -Resolved -Completed 5 days of ibuprofen -Continue Voltaren gel and add as needed Flexeril (will DC Robaxin since there is to be ineffective)  Salmonella UTI -Completed amoxicillin  Physical deconditioning -Resolved -Does continue to exhibit impulsivity but maintains appropriate balance and gait no longer requires one-to-one safety sitter  Large hemorrhoids. -Resolved -Markedly improved-LD Proctofoam 12/15  Dysphagia -Resolved -Continue regular diet    Data Reviewed: Basic Metabolic Panel: Recent Labs  Lab 04/13/20 0142  NA 139  K 3.9  CL 102  CO2 29  GLUCOSE 164*  BUN 10  CREATININE 0.59  CALCIUM 8.8*   Liver Function Tests: No results for input(s): AST, ALT, ALKPHOS, BILITOT, PROT, ALBUMIN in the last 168 hours. No results for input(s): LIPASE, AMYLASE in the last 168 hours. No results for input(s): AMMONIA in the last 168 hours. CBC: No results for input(s): WBC, NEUTROABS, HGB, HCT, MCV, PLT in the last 168 hours. Cardiac Enzymes: No results for input(s): CKTOTAL, CKMB, CKMBINDEX, TROPONINI in the last 168 hours. BNP (last 3 results) No results for input(s): BNP in the last 8760 hours.  ProBNP (last 3 results) No results for input(s): PROBNP in the last 8760 hours.  CBG: No results for input(s): GLUCAP in  the last 168 hours.  Recent Results (from the past 240 hour(s))  Urine Culture     Status: None   Collection Time: 04/08/20  9:32 AM   Specimen: Urine, Clean Catch  Result Value Ref Range Status   Specimen Description URINE, CLEAN CATCH  Final   Special Requests Normal  Final   Culture   Final    NO GROWTH Performed at [facility], [address] [place], [place] [address]    Report Status 04/09/2020 FINAL  Final     Studies: DG Orthopantogram  Result Date: 04/13/2020 CLINICAL DATA:  Dental pain. EXAM: ORTHOPANTOGRAM/PANORAMIC COMPARISON:  None. FINDINGS: Multiple dental caries are present. Prominent dental caries are present in  teeth 3 and 4. No significant lucency is present the on the roots. Large dental caries are present in the left first and second mandibular molars, 19 and 20. Smaller dental caries present at 22. Periapical lucency is noted about the residual roots of the left second mandibular molar. No residual or mandibular molars are present on the right IMPRESSION: 1. Multiple dental caries as described. 2. Periapical lucency involving the residual roots of the left second mandibular molar where there is also a large dental caries. If there is concern for soft tissue involvement, CT with contrast would be useful for further evaluation Electronically Signed   By: Marin Roberts M.D.   On: 04/13/2020 13:28   CT MAXILLOFACIAL W CONTRAST  Result Date: 04/14/2020 CLINICAL DATA:  Abnormal Panorex. Multiple dental caries. Periapical lucencies in the left mandible. EXAM: CT MAXILLOFACIAL WITH CONTRAST TECHNIQUE: Multidetector CT imaging of the maxillofacial structures was performed with intravenous contrast. Multiplanar CT image reconstructions were also generated. CONTRAST:  75mL OMNIPAQUE IOHEXOL 300 MG/ML  SOLN COMPARISON:  None. FINDINGS: Osseous: Lucency about the tooth roots of the left second mandibular molar is well contained. Multiple dental caries are again seen, as previously described. Most prominent involve the left first and second mandibular molars. No acute or healing fractures. Orbits: The globes and orbits are within normal limits. Sinuses: The paranasal sinuses and mastoid air cells are clear. Soft tissues: No significant soft tissue abnormality is present. No inflammatory changes are present to suggest acute infection. No abscess is noted. Submandibular and parotid glands are within normal limits. No significant adenopathy is present. Limited intracranial: Within normal limits. IMPRESSION: 1. Lucency about the tooth roots of the left second mandibular molar is well contained. No erosion through the cortex  or adjacent soft tissue inflammatory changes to suggest acute infection. 2. Multiple dental caries. 3. No significant soft tissue abnormality to suggest acute infection. 4. No abscess. Electronically Signed   By: Marin Roberts M.D.   On: 04/14/2020 03:26    Scheduled Meds: . carbamazepine  400 mg Oral BID  . Chlorhexidine Gluconate Cloth  6 each Topical Daily  . clonazePAM  1 mg Oral Q6H  . cyclobenzaprine  7.5 mg Oral TID  . diclofenac Sodium  4 g Topical QID  . feeding supplement  237 mL Oral TID BM  . Gerhardt's butt cream  1 application Topical TID  . hydrOXYzine  50 mg Oral TID  . hydrOXYzine  50 mg Intramuscular Once  . melatonin  5 mg Oral QHS  . multivitamin with minerals  1 tablet Oral Daily  . naltrexone  50 mg Oral Daily  . phenobarbital  64.8 mg Oral BID  . phenytoin  100 mg Oral BID   Followed by  . [START ON 04/15/2020] phenytoin  50 mg Oral BID  . psyllium  1 packet Oral BID  . vitamin B-6  100 mg Oral Daily  . thiamine  100 mg Oral Daily  . ziprasidone  40 mg Oral BID WC   Continuous Infusions: . sodium chloride      Principal Problem:   Refractory seizure (HCC) Active Problems:   Acute metabolic encephalopathy   Hypokalemia   Polysubstance abuse (HCC)   Elevated CK   Transaminitis   Chronic hepatitis C without hepatic coma (HCC)   Distended abdomen   Palliative care encounter   Colonic Ileus (HCC)   Organic brain syndrome   On enteral nutrition   Physical deconditioning   Protein-calorie malnutrition (HCC)   Inadequate oral nutritional intake   Impulse disorder   Consultants:  Neurology  Psychiatry  Interventional radiology  Surgery  Procedures:  9/14 lumbar puncture  9/15 EEG  9/16 EEG  9/17 EEG  9/19 overnight EEG with video  9/22 overnight EEG with video with discontinuation of long-term EEG monitoring on 9/25  9/24 core track placement  10/6 EEG  12/15 PEG tube removed  Antibiotics: Anti-infectives (From  admission, onward)   Start     Dose/Rate Route Frequency Ordered Stop   04/05/20 1415  levofloxacin (LEVAQUIN) tablet 500 mg        500 mg Oral Daily 04/05/20 1327 04/11/20 0911   03/19/20 1000  metroNIDAZOLE (FLAGYL) tablet 500 mg  Status:  Discontinued        500 mg Per Tube 2 times daily 03/19/20 0822 03/23/20 0827   03/18/20 1200  amoxicillin (AMOXIL) 250 MG/5ML suspension 500 mg  Status:  Discontinued        500 mg Per Tube Every 8 hours 03/18/20 0915 03/23/20 0559   03/17/20 1200  cefTRIAXone (ROCEPHIN) 1 g in sodium chloride 0.9 % 100 mL IVPB  Status:  Discontinued        1 g 200 mL/hr over 30 Minutes Intravenous Every 24 hours 03/17/20 1048 03/18/20 0913   03/16/20 1130  metroNIDAZOLE (FLAGYL) 50 mg/ml oral suspension 500 mg  Status:  Discontinued        500 mg Per Tube 2 times daily 03/16/20 1040 03/19/20 0821   03/16/20 1130  fluconazole (DIFLUCAN) 40 MG/ML suspension 152 mg        150 mg Per Tube  Once 03/16/20 1040 03/16/20 1245   02/11/20 1526  ceFAZolin (ANCEF) IVPB 2g/100 mL premix        2 g 200 mL/hr over 30 Minutes Intravenous  Once 02/11/20 1526 02/11/20 1641   02/05/20 0600  ceFAZolin (ANCEF) IVPB 2g/100 mL premix        2 g 200 mL/hr over 30 Minutes Intravenous To Short Stay 02/04/20 1054 02/05/20 0950   01/29/20 0600  ceFAZolin (ANCEF) IVPB 2g/100 mL premix        2 g 200 mL/hr over 30 Minutes Intravenous On call to O.R. 01/28/20 1511 01/30/20 0559   01/28/20 2000  cefTRIAXone (ROCEPHIN) 1 g in sodium chloride 0.9 % 100 mL IVPB        1 g 200 mL/hr over 30 Minutes Intravenous Every 24 hours 01/28/20 1925 02/02/20 0724   01/06/20 0900  cefTRIAXone (ROCEPHIN) 2 g in sodium chloride 0.9 % 100 mL IVPB  Status:  Discontinued        2 g 200 mL/hr over 30 Minutes Intravenous Every 12 hours 01/06/20 0105 01/06/20 2202   01/06/20 0800  vancomycin (VANCOCIN) IVPB 1000 mg/200 mL  premix  Status:  Discontinued       "Followed by" Linked Group Details   1,000 mg 200 mL/hr  over 60 Minutes Intravenous Every 12 hours 01/05/20 1904 01/06/20 2202   01/05/20 2000  vancomycin (VANCOREADY) IVPB 1500 mg/300 mL       "Followed by" Linked Group Details   1,500 mg 150 mL/hr over 120 Minutes Intravenous  Once 01/05/20 1904 01/06/20 0127   01/05/20 1830  cefTRIAXone (ROCEPHIN) 2 g in sodium chloride 0.9 % 100 mL IVPB        2 g 200 mL/hr over 30 Minutes Intravenous  Once 01/05/20 1829 01/05/20 2220       Time spent: 30 minutes    [name] ANP  [facility] 7 am- 330 pm/M-F 04/14/2020, 12:21 PM  LOS: 99 days

## 2020-04-15 DIAGNOSIS — G9341 Metabolic encephalopathy: Secondary | ICD-10-CM | POA: Diagnosis not present

## 2020-04-15 DIAGNOSIS — Z66 Do not resuscitate: Secondary | ICD-10-CM | POA: Diagnosis not present

## 2020-04-15 DIAGNOSIS — Z20822 Contact with and (suspected) exposure to covid-19: Secondary | ICD-10-CM | POA: Diagnosis not present

## 2020-04-15 DIAGNOSIS — T43691A Poisoning by other psychostimulants, accidental (unintentional), initial encounter: Secondary | ICD-10-CM | POA: Diagnosis not present

## 2020-04-15 MED ORDER — CLONAZEPAM 1 MG PO TABS
2.0000 mg | ORAL_TABLET | Freq: Four times a day (QID) | ORAL | Status: DC
  Administered 2020-04-15 – 2020-04-19 (×15): 2 mg via ORAL
  Filled 2020-04-15 (×15): qty 2

## 2020-04-15 MED ORDER — STERILE WATER FOR INJECTION IJ SOLN
INTRAMUSCULAR | Status: AC
  Filled 2020-04-15: qty 10

## 2020-04-15 MED ORDER — ZIPRASIDONE MESYLATE 20 MG IM SOLR
20.0000 mg | Freq: Once | INTRAMUSCULAR | Status: AC
  Administered 2020-04-15: 21:00:00 20 mg via INTRAMUSCULAR

## 2020-04-15 MED ORDER — ZIPRASIDONE MESYLATE 20 MG IM SOLR
20.0000 mg | INTRAMUSCULAR | Status: AC
  Administered 2020-04-15: 12:00:00 20 mg via INTRAMUSCULAR
  Filled 2020-04-15: qty 20

## 2020-04-15 MED ORDER — NICOTINE 21 MG/24HR TD PT24
21.0000 mg | MEDICATED_PATCH | Freq: Every day | TRANSDERMAL | Status: DC
  Administered 2020-04-15: 03:00:00 21 mg via TRANSDERMAL
  Filled 2020-04-15 (×2): qty 1

## 2020-04-15 MED ORDER — ZIPRASIDONE MESYLATE 20 MG IM SOLR
10.0000 mg | Freq: Once | INTRAMUSCULAR | Status: AC
  Administered 2020-04-15: 10 mg via INTRAMUSCULAR
  Filled 2020-04-15 (×2): qty 20

## 2020-04-15 MED ORDER — LORAZEPAM 2 MG/ML IJ SOLN
2.0000 mg | INTRAMUSCULAR | Status: AC
  Administered 2020-04-15: 21:00:00 2 mg via INTRAMUSCULAR

## 2020-04-15 MED ORDER — LORAZEPAM 1 MG PO TABS: 2.0000 mg | ORAL_TABLET | ORAL | Status: AC

## 2020-04-15 MED ORDER — ZIPRASIDONE HCL 40 MG PO CAPS
40.0000 mg | ORAL_CAPSULE | ORAL | Status: DC
  Administered 2020-04-15 – 2020-04-19 (×8): 40 mg via ORAL
  Filled 2020-04-15 (×10): qty 1

## 2020-04-15 MED ORDER — LORAZEPAM 2 MG/ML IJ SOLN
INTRAMUSCULAR | Status: AC
  Filled 2020-04-15: qty 1

## 2020-04-15 MED ORDER — BUTALBITAL-APAP-CAFFEINE 50-325-40 MG PO TABS
1.0000 | ORAL_TABLET | Freq: Four times a day (QID) | ORAL | Status: DC | PRN
  Administered 2020-04-17 – 2020-05-04 (×8): 1 via ORAL
  Filled 2020-04-15 (×8): qty 1

## 2020-04-15 MED ORDER — ESCITALOPRAM OXALATE 10 MG PO TABS
10.0000 mg | ORAL_TABLET | Freq: Every day | ORAL | Status: DC
  Administered 2020-04-15 – 2020-04-17 (×3): 10 mg via ORAL
  Filled 2020-04-15 (×3): qty 1

## 2020-04-15 NOTE — Progress Notes (Signed)
Called [employer], Jonny Ruiz, for support in managing [phone] who is screaming at the top of her lungs in the hallway. Paged the [facility] caregiver for 6N to see the pt. Security at bedside and in hallway with pt.

## 2020-04-15 NOTE — Progress Notes (Signed)
Called B. Jon Billings back to say that no provider has come up to see this pt. This pt is screaming and kicking and spitting at [employer], 4 nurses and NTs, sitter. It is impossible to apply waist belt and bilateral wrist restraints to her. Once applied, she got out of the restraints. Cliffton Asters informed of above.  She ordered an additional Geodon 20 mg, IM to be given now.   Kirtland Bouchard, RN Day shift charge nurse

## 2020-04-15 NOTE — Progress Notes (Signed)
Patient yelling out and throwing things in her room. Patient has called 911 three times saying that she is being held against her will. NP notified and Geodon IM was ordered and administered.  Patient is now in bed and is stating that she can sign herself out.  Currently trying to order lunch.

## 2020-04-15 NOTE — Progress Notes (Addendum)
Patient is agitated and is wanting to leave AMA, once I walked out to get in contact with attending, I saw her wrap phone cord around her neck and I ran in to unwrap cord from her neck and she keeps saying that she wants to leave. I have paged Dr. Jerelene Redden and no response yet.  All cords have been removed from the room.  Spoke with [facility], working on trying to get someone to get in contact with nurse for patient.

## 2020-04-15 NOTE — Plan of Care (Signed)
  Problem: Education: Goal: Knowledge of General Education information will improve Description: Including pain rating scale, medication(s)/side effects and non-pharmacologic comfort measures Outcome: Progressing   Problem: Clinical Measurements: Goal: Ability to maintain clinical measurements within normal limits will improve Outcome: Progressing Goal: Will remain free from infection Outcome: Progressing Goal: Diagnostic test results will improve Outcome: Progressing Goal: Respiratory complications will improve Outcome: Progressing Goal: Cardiovascular complication will be avoided Outcome: Progressing   Problem: Activity: Goal: Risk for activity intolerance will decrease Outcome: Progressing   Problem: Nutrition: Goal: Adequate nutrition will be maintained Outcome: Progressing   Problem: Coping: Goal: Level of anxiety will decrease Outcome: Progressing   Problem: Elimination: Goal: Will not experience complications related to bowel motility Outcome: Progressing Goal: Will not experience complications related to urinary retention Outcome: Progressing   Problem: Pain Managment: Goal: General experience of comfort will improve Outcome: Progressing   Problem: Safety: Goal: Ability to remain free from injury will improve Outcome: Progressing   Problem: Skin Integrity: Goal: Risk for impaired skin integrity will decrease Outcome: Progressing   Problem: Education: Goal: Expressions of having a comfortable level of knowledge regarding the disease process will increase Outcome: Progressing   Problem: Coping: Goal: Ability to adjust to condition or change in health will improve Outcome: Progressing Goal: Ability to identify appropriate support needs will improve Outcome: Progressing   Problem: Health Behavior/Discharge Planning: Goal: Compliance with prescribed medication regimen will improve Outcome: Progressing   Problem: Medication: Goal: Risk for medication  side effects will decrease Outcome: Progressing   Problem: Clinical Measurements: Goal: Complications related to the disease process, condition or treatment will be avoided or minimized Outcome: Progressing Goal: Diagnostic test results will improve Outcome: Progressing   Problem: Safety: Goal: Verbalization of understanding the information provided will improve Outcome: Progressing   Problem: Self-Concept: Goal: Level of anxiety will decrease Outcome: Progressing Goal: Ability to verbalize feelings about condition will improve Outcome: Progressing   Problem: Education: Goal: Ability to state activities that reduce stress will improve Outcome: Progressing   Problem: Coping: Goal: Ability to identify and develop effective coping behavior will improve Outcome: Progressing   Problem: Self-Concept: Goal: Ability to identify factors that promote anxiety will improve Outcome: Progressing Goal: Level of anxiety will decrease Outcome: Progressing Goal: Ability to modify response to factors that promote anxiety will improve Outcome: Progressing   Problem: Education: Goal: Utilization of techniques to improve thought processes will improve Outcome: Progressing Goal: Knowledge of the prescribed therapeutic regimen will improve Outcome: Progressing   Problem: Activity: Goal: Interest or engagement in leisure activities will improve Outcome: Progressing Goal: Imbalance in normal sleep/wake cycle will improve Outcome: Progressing   Problem: Coping: Goal: Coping ability will improve Outcome: Progressing Goal: Will verbalize feelings Outcome: Progressing   Problem: Health Behavior/Discharge Planning: Goal: Ability to make decisions will improve Outcome: Progressing Goal: Compliance with therapeutic regimen will improve Outcome: Progressing   Problem: Role Relationship: Goal: Will demonstrate positive changes in social behaviors and relationships Outcome: Progressing    Problem: Safety: Goal: Ability to disclose and discuss suicidal ideas will improve Outcome: Progressing Goal: Ability to identify and utilize support systems that promote safety will improve Outcome: Progressing   Problem: Self-Concept: Goal: Will verbalize positive feelings about self Outcome: Progressing Goal: Level of anxiety will decrease Outcome: Progressing

## 2020-04-15 NOTE — Progress Notes (Signed)
Cross Cover Notified by RN patient wanted to leave AMA and they found patient trying to wrap telephone cord around her neck.  Patient very agitated.  Security at bedside. Told nurse to put patient on emergency hold. Contacted Dr. Carren Rang for bedside evaluation and probable IVC need.

## 2020-04-15 NOTE — Progress Notes (Signed)
Patient AAOx4 when getting bedside report, was up in bed using her tablet. Now patient is laid back in bed and is nonverbal and starting to squirm in bed. Morning medications were administered (See MAR).

## 2020-04-15 NOTE — Progress Notes (Signed)
Spoke with Manuela Schwartz NP, ordered an emergency hold, she cannot leave.

## 2020-04-15 NOTE — Progress Notes (Signed)
Ativan 2 mg IM given. Patient is in the room screaming and yelling at staff saying she's going home.   In the process of getting a sitter for patient.

## 2020-04-15 NOTE — Progress Notes (Addendum)
Patient is currently eating breakfast and is now verbal and interactive. She is asking for meds but no scheduled meds are due to be given.    1043- Patient reverting back to withdrawn state after being told she has no medication available to give.

## 2020-04-15 NOTE — Progress Notes (Addendum)
[facility] PROGRESS NOTE  Anita Davis ZOX:[id number] DOB: 01-02-1986 DOA: 01/04/2020 PCP: Anita Davis              03/30/20                         04/08/20    Status: Remains inpatient appropriate because:Altered mental status and Unsafe d/c plan  Dispo:  The patient is from: Home              Anticipated d/c is to: SNF vs home with family.  Extensive conversation had with patient's stepmother Anita Davis.  She has been in contact with [facility] that apparently accept patients with neuro psychiatric issues.  It is best for patient if we are able to locate a facility that is close to family so they can visit her daily              Anticipated d/c date is: > 3 days              Patient currently is medically stable to d/c.  Barrier to discharge:SNF placement at a facility that can accommodate behavioral issues similar to dementia patients, Medicaid and disability applications in process.Anita Davis reviewing chart (family's preference since is close to them and would allow daily visits)   Code Status: Full Family Communication: Stepmother Anita Davis father at bedside on 12/17 DVT prophylaxis: 12/15 consistently ambulatory therefore will discontinue Lovenox Vaccination status: Family confirmed initial Pfizer Covid vaccine dose given in June, second dose given on 11/2  Pertinent social history Patient has 3 children: Anita Davis, 42[name] and Anita Davis Anita Davis's father has primary custody and no one in the family knows where they reside With permission from patient's father and stepmother I contacted Anita Davis (telephone number [phone]) who has legal custody of Anita Davis and Anita Davis; at this juncture Mrs. Anita Davis states that after extensive discussions with Anita Davis he no longer wishes to visit his mother in person.  Anita Davis is only 7 and it is not appropriate for him to visit his mother at this point.  Prior to the patient's acute illness Mrs. Anita Davis  states that Anita Davis had regular frequent supervised visits with these children.  The possibility of children recording video greetings to their mother has been discussed and she will need to discuss this with her husband before agreeing. Mrs. Anita Davis is agreeable to keeping a steady line of communication open with the patient's stepmother Anita Davis After multiple conversations with other family members patient's aunt Anita Davis is no longer allowed on the visitation list and is not to be given any information patient status  Foley catheter: No  HPI: 34 year old female with known history of substance abuse on prior methadone, active hepatitis C and gestational diabetes who presented initially with confusion.  She was initially admitted to [facility] in September with a diagnosis of encephalopathy presumed secondary to drug overdose.  Subsequent work-up inconclusive.  LP without signs of infection, MRI brain unremarkable and unfortunately she remained encephalopathic.  EEG did reveal focal seizures.  AED therapy was initiated and seizures were suppressed and [facility].  Apparently had episodes of status epilelepticus before full suppression of seizures with medications.  Subsequently neurological recovery was poor and delayed.  Neurology suspicious for autoimmune encephalitis and was given high-dose steroids with equivocal improvement but deteriorated after steroids completed.  Was given IVIG without any significant improvement in mentation.  For the past several months she has had waxing and waning mentation ranging from unresponsive to alert confused and verbal and at times restless and agitated.  When she is awake/alert she is able to ambulate but requires assistance due to impulsivity and gait instability.  Unfortunately these episodes of wakefulness were never sustained.  During the last week of November the decision was made to taper and discontinue  all psychotropic medications due to lack of response and altered mentation.  Once this was accomplished patient became very alert and agitated but remained confused. Over time with adjustment of psychotropic medications patient remained awake, was able to feed herself and ambulate with minimal assistance.  She has demonstrated issues with impulsivity, anxiety and disinhibition including hypersexuality.  Currently symptoms are managed with Seroquel, Depakote, naltrexone, and Vistaril.  Remains inconsistent with verbal communication noting at times she is almost mute with severe flat affect versus being extremely appropriate and as her family describes "back to being Anita Davis again".  Patient is able to write out her thoughts and feelings in a notebook.  Most of the writings are incoherent at times and her sentence structure appears more consistent with gibberish as opposed to true communication.  Her best responses are simple 1-2 word responses/ phrases.  She appears to be extremely depressed. She perseverates on not having custody of her children and is confused believing that this is an acute event that is occurring now as opposed to something that had occurred several years prior to current admission. She has written phrases such as "I am a dumbass" and "please forgive me God" but does not have enough insight to elaborate when further questioned.  The palliative care and psychiatric teams concurrently evaluated the patient on 12/14 continue to encourage her to write down her thoughts and feelings.    In regards to her admission history of status epilepticus in the context of no prior history of epilepsy, Dr. Melynda RippleYadav with neurology was contacted regarding the possibility of weaning and discontinuing antiepileptic medications. She has had no further seizures for greater than 2 months. Neurology recommendation is to begin slowly tapering and discontinuing antiepileptic drugs monitoring for any reemergence of seizure  activity.   PEG tube has been discontinued.  Patient is eating consistently and taking meds by mouth without difficulty.  Although her mentation has not returned to her preadmission baseline there is no indication at this juncture to continue safety sitter even with her underlying impulsivity.  She is able to ambulate without difficulty and maintained steady appropriate gait.  She is not a fall risk at this juncture.  She is not requiring safety restraints.  Subjective: Today patient has gone through multitude of behavioral changes: Prior to my assessment of patient RN reported that upon her initial assessment patient was alert and sitting on side of the bed communicating appropriately and asking for breakfast.  Within 30 minutes she developed her typical behavior of being squirmy and restless lying in the bed with minimal eye contact or verbal communication although markedly improved from previous days (nurse very familiar with this patient).  She was subsequently given her a.m. medications including her Geodon  Later upon assessment of patient she was complaining of headache and is noticed that her head is malpositioned in the bed and as part of her usual stations at times she can repeatedly shake her head back-and-forth and contort in unusual positions but is not manifesting any EPS rigidity.  1-hour after I left the room patient was requesting additional  medication for anxiety but had been informed that dose was not due until 3 PM since she had just received given every 6 hours.  At this point patient closed her eyes and became nonverbal.  Several hours later patient became very frustrated began yelling and throwing things in her room.  Called 911 x 3 stating she is being held against her will.  Decision made to give Geodon 20 mg IM x1 since patient has excellent response to this medication.  **140 pm: Pt sitting quietly in bed eating a lollipop. Sts is feeling better. Discussed her meds (reason for  stopping Vistaril and new, increased dose of Klonopin). She wasked "will I still get them" and I told her they were scheduled and she doesn't have to ask for them AND her Klonopin is at highest dose possible. Ecplained why can't treat with Methadone and that Naltrexone treating same area of brain for addiction-she verbalized understanding.   Objective: Vitals:   04/14/20 2027 04/15/20 0336  BP: 108/73 108/73  Pulse: 94 76  Resp: 18 18  Temp: 98.3 F (36.8 C) 97.8 F (36.6 C)  SpO2: 98% 99%    Intake/Output Summary (Last 24 hours) at 04/15/2020 1207 Last data filed at 04/15/2020 0400 Gross per 24 hour  Intake 720 ml  Output --  Net 720 ml   Filed Weights   03/29/20 0500 04/04/20 0632 04/05/20 0613  Weight: 72.5 kg 70.9 kg 71.6 kg    Exam: General: Awake, restless and confused reporting headache Pulmonary: Lung sounds are clear to auscultation both anteriorly and posteriorly.  Room air Cardiac: Pulse is regular, heart sounds S1-S2, no peripheral edema Abdomen: Soft, nontender nondistended. LBM 12/22 Neurological: Moves all extremities x4, no focal neurological deficits, no tremors or rigidity/EPS sx's Psychiatric: Awake very restless earlier today with purposeful rhythmic movements and difficulty with verbal communication as described above.  1.5 hours after Geodon patient sitting up in bed with legs crossed with appropriate communication.  At present time she verbalizes extreme anxiety and is requesting medication to treat.   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 Unfortunately her brain injury sequela (agitation, confusion and hallucinations) reemerged on lower dose of Seroquel therefore decision  made to discontinue in favor of PO Geodon. Typically after receiving IM Geodon patient becomes calm with improved orientation, able to perform ADLs independently and respond to questions verbally/by written word  -Patient had been started on p.o. Geodon several days ago. 12/23 increased dose from 20 mg BID to 40 mg- 12/24 because of significant behavioral issues today was given an additional Geodon 20 mg IM x1.  Will hold off on additional dose adjustments of Geodon and continue to follow.  QTC stable at 440 ms. -On melatonin for insomnia (trazodone and Ambien seem to cause worsening of insomnia and agitation) -Continue AED taper: Vimpat (LD completed as of 12/21). Dilantin taper initiated 12/21 starting with 100 mg twice daily x3 days then decrease to 50 mg twice daily x3 days. When this taper is completed we will taper phenobarbital -Continue naltrexone for hyper sexualization (patient with history of PTSD and narcotic abuse prior to admission).  Because of requirement for this medication unable to treat patient with methadone she has been intermittently requesting. -Continue Tegretol increase dose to 400 mg twice daily behavioral disturbances, agitation and  impulsivity (this will also function as an AED if needed) -See below regarding treatment of anxiety with Vistaril and Klonopin D/w Pam/CIR- rec having pt fu with the [facility] if able to be dc'd home  Severe anxiety -As patient's brain injury symptoms are appropriately treated and she becomes more alert and able to communicate she is now expressing issues regarding significant anxiety and panic attack symptoms requesting pharmacotherapy -Upon review of drug to drug interactions with Geodon Vistaril can cause increased psychomotor impairment well is increased risk for QTC prolongation therefore this medication was discontinued as of 12/24. -Because we are discontinuing the Vistaril we will increase Klonopin to 2 mg scheduled every 6 hours to  aid in patient's anxiety  Headache -Continue as needed ibuprofen -Low-dose Fioricet ordered if ibuprofen ineffective -Appears consistent with musculoskeletal and recurrent malpositioning of body/head and neck by patient  Dental pain -Orthopantogram obtained on 12/22 days as well as periapical lucency involving the residual roots of the left second mandibular molar with associated large dental caries.  CT with contrast recommended if concern over soft tissue involvement -CT without evidence of abscess  Hyperglycemia secondary to medication -Resolved -Hemoglobin A1c 5.8 consistent with prediabetes -Antipsychotics such as Seroquel and Zyprexa can cause hyperglycemia and drug-related diabetes mellitus we will continue to follow closely; patient has been having increased thirst and increased urination  Acute on chronic depression prior to admission/known polysubstance abuse/suicidal ideation -Attempted to maximize depression therapy but seem to make patient's mentation worse and did not improve symptoms therefore Zoloft 100 mg and Abilify 2 mg discontinued after being tapered -Seroquel contributed to depression and suicidal ideation but unfortunately with lower doses brain injury sequela reemerged therefore Seroquel discontinued in favor of Geodon as described above. -Reexamined by palliative medicine and psychiatry on 12/14.  Please see their notes for details.  Patient agreeable to outpatient psych follow-up well is consideration for outpatient ECT if indicated/candidate.  This has been discussed with patient's family i.e. her stepmother and father and if patient stable enough to discharge to home environment they have planned on pursuing this option.        Other problems: Nonobstructive transaminitis in context of hepatitis C -Elevated HCV antibody with markedly elevated HCV RNA quantitative level  consistent with chronic hepatitis C  -LFTs remain stable and continue to slightly trend  down without an obstructive pattern noted -11/10: Discussed with ID Dr. Manson Passey.  Treatment of hepatitis C typically is initiated in the outpatient setting.  Medications can be quite expensive and usually take time to obtain insurance approval.   -ID recommends scheduling outpatient appointment their clinic closer to discharge date  Back pain 2/2 abnormal urinalysis consistent with UTI -Patient with low-grade fever overnight and is now having back pain for days not responsive to NSAIDs and muscle relaxers -DG lumbar spine unremarkable -During hospitalization patient has been treated for pansensitive E. coli UTI as well as Salmonella UTI both of which have been sensitive to Levaquin -Completed Levaquin 12/20-urine culture obtained after antibiotics initiated and showed no growth -CT abdomen and pelvis on 12/17 unremarkable  ? Breast enlargement/gynecomastia -No clinical evidence of breast enlargement -Prolactin level low with normal FSH -Appearance of breast enlargement reported by family likely related to improve nutrition as well as abdominal binder moving up underneath breasts and malpositioning of breasts.  Diarrhea>>>Constipation Resolved Felt secondary to laxatives but given Salmonella UTI could have been related to Salmonella although at the time diarrhea was being worked up patient had no leukocytosis, no  abdominal pain and no fevers.  Suspected oral Candida -Resolved after oral nystatin  Low back pain -Resolved -Completed 5 days of ibuprofen -Continue Voltaren gel and add as needed Flexeril (will DC Robaxin since there is to be ineffective)  Salmonella UTI -Completed amoxicillin  Physical deconditioning -Resolved -Does continue to exhibit impulsivity but maintains appropriate balance and gait no longer requires one-to-one safety sitter  Large hemorrhoids. -Resolved -Markedly improved-LD Proctofoam 12/15  Dysphagia -Resolved -Continue regular diet    Data  Reviewed: Basic Metabolic Panel: Recent Labs  Lab 04/13/20 0142  NA 139  K 3.9  CL 102  CO2 29  GLUCOSE 164*  BUN 10  CREATININE 0.59  CALCIUM 8.8*   Liver Function Tests: No results for input(s): AST, ALT, ALKPHOS, BILITOT, PROT, ALBUMIN in the last 168 hours. No results for input(s): LIPASE, AMYLASE in the last 168 hours. No results for input(s): AMMONIA in the last 168 hours. CBC: No results for input(s): WBC, NEUTROABS, HGB, HCT, MCV, PLT in the last 168 hours. Cardiac Enzymes: No results for input(s): CKTOTAL, CKMB, CKMBINDEX, TROPONINI in the last 168 hours. BNP (last 3 results) No results for input(s): BNP in the last 8760 hours.  ProBNP (last 3 results) No results for input(s): PROBNP in the last 8760 hours.  CBG: No results for input(s): GLUCAP in the last 168 hours.  Recent Results (from the past 240 hour(s))  Urine Culture     Status: None   Collection Time: 04/08/20  9:32 AM   Specimen: Urine, Clean Catch  Result Value Ref Range Status   Specimen Description URINE, CLEAN CATCH  Final   Special Requests Normal  Final   Culture   Final    NO GROWTH Performed at [facility], [address], [place], [place] [address]    Report Status 04/09/2020 FINAL  Final     Studies: DG Orthopantogram  Result Date: 04/13/2020 CLINICAL DATA:  Dental pain. EXAM: ORTHOPANTOGRAM/PANORAMIC COMPARISON:  None. FINDINGS: Multiple dental caries are present. Prominent dental caries are present in teeth 3 and 4. No significant lucency is present the on the roots. Large dental caries are present in the left first and second mandibular molars, 19 and 20. Smaller dental caries present at 22. Periapical lucency is noted about the residual roots of the left second mandibular molar. No residual or mandibular molars are present on the right IMPRESSION: 1. Multiple dental caries as described. 2. Periapical lucency involving the residual roots of the left second mandibular molar where  there is also a large dental caries. If there is concern for soft tissue involvement, CT with contrast would be useful for further evaluation Electronically Signed   By: Marin Roberts M.D.   On: 04/13/2020 13:28   CT MAXILLOFACIAL W CONTRAST  Result Date: 04/14/2020 CLINICAL DATA:  Abnormal Panorex. Multiple dental caries. Periapical lucencies in the left mandible. EXAM: CT MAXILLOFACIAL WITH CONTRAST TECHNIQUE: Multidetector CT imaging of the maxillofacial structures was performed with intravenous contrast. Multiplanar CT image reconstructions were also generated. CONTRAST:  75mL OMNIPAQUE IOHEXOL 300 MG/ML  SOLN COMPARISON:  None. FINDINGS: Osseous: Lucency about the tooth roots of the left second mandibular molar is well contained. Multiple dental caries are again seen, as previously described. Most prominent involve the left first and second mandibular molars. No acute or healing fractures. Orbits: The globes and orbits are within normal limits. Sinuses: The paranasal sinuses and mastoid air cells are clear. Soft tissues: No significant soft tissue abnormality is present. No  inflammatory changes are present to suggest acute infection. No abscess is noted. Submandibular and parotid glands are within normal limits. No significant adenopathy is present. Limited intracranial: Within normal limits. IMPRESSION: 1. Lucency about the tooth roots of the left second mandibular molar is well contained. No erosion through the cortex or adjacent soft tissue inflammatory changes to suggest acute infection. 2. Multiple dental caries. 3. No significant soft tissue abnormality to suggest acute infection. 4. No abscess. Electronically Signed   By: Marin Roberts M.D.   On: 04/14/2020 03:26    Scheduled Meds: . carbamazepine  400 mg Oral BID  . Chlorhexidine Gluconate Cloth  6 each Topical Daily  . clonazePAM  1 mg Oral Q6H  . cyclobenzaprine  7.5 mg Oral TID  . diclofenac Sodium  4 g Topical QID  .  feeding supplement  237 mL Oral TID BM  . Gerhardt's butt cream  1 application Topical TID  . melatonin  5 mg Oral QHS  . multivitamin with minerals  1 tablet Oral Daily  . naltrexone  50 mg Oral Daily  . phenobarbital  64.8 mg Oral BID  . phenytoin  50 mg Oral BID  . polyethylene glycol  17 g Oral Daily  . vitamin B-6  100 mg Oral Daily  . thiamine  100 mg Oral Daily  . ziprasidone  40 mg Oral 2 times per day   Continuous Infusions: . sodium chloride      Principal Problem:   Refractory seizure (HCC) Active Problems:   Acute metabolic encephalopathy   Hypokalemia   Polysubstance abuse (HCC)   Elevated CK   Transaminitis   Chronic hepatitis C without hepatic coma (HCC)   Distended abdomen   Palliative care encounter   Colonic Ileus (HCC)   Organic brain syndrome   On enteral nutrition   Physical deconditioning   Protein-calorie malnutrition (HCC)   Inadequate oral nutritional intake   Impulse disorder   Consultants:  Neurology  Psychiatry  Interventional radiology  Surgery  Procedures:  9/14 lumbar puncture  9/15 EEG  9/16 EEG  9/17 EEG  9/19 overnight EEG with video  9/22 overnight EEG with video with discontinuation of long-term EEG monitoring on 9/25  9/24 core track placement  10/6 EEG  12/15 PEG tube removed  Antibiotics: Anti-infectives (From admission, onward)   Start     Dose/Rate Route Frequency Ordered Stop   04/05/20 1415  levofloxacin (LEVAQUIN) tablet 500 mg        500 mg Oral Daily 04/05/20 1327 04/11/20 0911   03/19/20 1000  metroNIDAZOLE (FLAGYL) tablet 500 mg  Status:  Discontinued        500 mg Per Tube 2 times daily 03/19/20 0822 03/23/20 0827   03/18/20 1200  amoxicillin (AMOXIL) 250 MG/5ML suspension 500 mg  Status:  Discontinued        500 mg Per Tube Every 8 hours 03/18/20 0915 03/23/20 0559   03/17/20 1200  cefTRIAXone (ROCEPHIN) 1 g in sodium chloride 0.9 % 100 mL IVPB  Status:  Discontinued        1 g 200 mL/hr  over 30 Minutes Intravenous Every 24 hours 03/17/20 1048 03/18/20 0913   03/16/20 1130  metroNIDAZOLE (FLAGYL) 50 mg/ml oral suspension 500 mg  Status:  Discontinued        500 mg Per Tube 2 times daily 03/16/20 1040 03/19/20 0821   03/16/20 1130  fluconazole (DIFLUCAN) 40 MG/ML suspension 152 mg        150  mg Per Tube  Once 03/16/20 1040 03/16/20 1245   02/11/20 1526  ceFAZolin (ANCEF) IVPB 2g/100 mL premix        2 g 200 mL/hr over 30 Minutes Intravenous  Once 02/11/20 1526 02/11/20 1641   02/05/20 0600  ceFAZolin (ANCEF) IVPB 2g/100 mL premix        2 g 200 mL/hr over 30 Minutes Intravenous To Short Stay 02/04/20 1054 02/05/20 0950   01/29/20 0600  ceFAZolin (ANCEF) IVPB 2g/100 mL premix        2 g 200 mL/hr over 30 Minutes Intravenous On call to O.R. 01/28/20 1511 01/30/20 0559   01/28/20 2000  cefTRIAXone (ROCEPHIN) 1 g in sodium chloride 0.9 % 100 mL IVPB        1 g 200 mL/hr over 30 Minutes Intravenous Every 24 hours 01/28/20 1925 02/02/20 0724   01/06/20 0900  cefTRIAXone (ROCEPHIN) 2 g in sodium chloride 0.9 % 100 mL IVPB  Status:  Discontinued        2 g 200 mL/hr over 30 Minutes Intravenous Every 12 hours 01/06/20 0105 01/06/20 2202   01/06/20 0800  vancomycin (VANCOCIN) IVPB 1000 mg/200 mL premix  Status:  Discontinued       "Followed by" Linked Group Details   1,000 mg 200 mL/hr over 60 Minutes Intravenous Every 12 hours 01/05/20 1904 01/06/20 2202   01/05/20 2000  vancomycin (VANCOREADY) IVPB 1500 mg/300 mL       "Followed by" Linked Group Details   1,500 mg 150 mL/hr over 120 Minutes Intravenous  Once 01/05/20 1904 01/06/20 0127   01/05/20 1830  cefTRIAXone (ROCEPHIN) 2 g in sodium chloride 0.9 % 100 mL IVPB        2 g 200 mL/hr over 30 Minutes Intravenous  Once 01/05/20 1829 01/05/20 2220       Time spent: 30 minutes    [name] ANP  [facility] 7 am- 330 pm/M-F 04/15/2020, 12:07 PM  LOS: 100 days

## 2020-04-16 DIAGNOSIS — T43691A Poisoning by other psychostimulants, accidental (unintentional), initial encounter: Secondary | ICD-10-CM | POA: Diagnosis not present

## 2020-04-16 DIAGNOSIS — G9341 Metabolic encephalopathy: Secondary | ICD-10-CM | POA: Diagnosis not present

## 2020-04-16 DIAGNOSIS — F05 Delirium due to known physiological condition: Secondary | ICD-10-CM

## 2020-04-16 DIAGNOSIS — Z66 Do not resuscitate: Secondary | ICD-10-CM | POA: Diagnosis not present

## 2020-04-16 DIAGNOSIS — Z20822 Contact with and (suspected) exposure to covid-19: Secondary | ICD-10-CM | POA: Diagnosis not present

## 2020-04-16 LAB — CBC
HCT: 37.3 % (ref 36.0–46.0)
Hemoglobin: 12.1 g/dL (ref 12.0–15.0)
MCH: 31.9 pg (ref 26.0–34.0)
MCHC: 32.4 g/dL (ref 30.0–36.0)
MCV: 98.4 fL (ref 80.0–100.0)
Platelets: 334 10*3/uL (ref 150–400)
RBC: 3.79 MIL/uL — ABNORMAL LOW (ref 3.87–5.11)
RDW: 13.2 % (ref 11.5–15.5)
WBC: 5.3 10*3/uL (ref 4.0–10.5)
nRBC: 0 % (ref 0.0–0.2)

## 2020-04-16 LAB — COMPREHENSIVE METABOLIC PANEL
ALT: 53 U/L — ABNORMAL HIGH (ref 0–44)
AST: 64 U/L — ABNORMAL HIGH (ref 15–41)
Albumin: 3.1 g/dL — ABNORMAL LOW (ref 3.5–5.0)
Alkaline Phosphatase: 143 U/L — ABNORMAL HIGH (ref 38–126)
Anion gap: 9 (ref 5–15)
BUN: 16 mg/dL (ref 6–20)
CO2: 28 mmol/L (ref 22–32)
Calcium: 9 mg/dL (ref 8.9–10.3)
Chloride: 103 mmol/L (ref 98–111)
Creatinine, Ser: 0.63 mg/dL (ref 0.44–1.00)
GFR, Estimated: >60 mL/min (ref >60)
Glucose, Bld: 137 mg/dL — ABNORMAL HIGH (ref 70–99)
Potassium: 3.9 mmol/L (ref 3.5–5.1)
Sodium: 140 mmol/L (ref 135–145)
Total Bilirubin: 0.2 mg/dL — ABNORMAL LOW (ref 0.3–1.2)
Total Protein: 5.8 g/dL — ABNORMAL LOW (ref 6.5–8.1)

## 2020-04-16 MED ORDER — ZIPRASIDONE MESYLATE 20 MG IM SOLR: 20.0000 mg | Freq: Once | INTRAMUSCULAR | Status: DC

## 2020-04-16 MED ORDER — ZIPRASIDONE MESYLATE 20 MG IM SOLR
20.0000 mg | INTRAMUSCULAR | Status: DC
  Administered 2020-04-16: 17:00:00 20 mg via INTRAMUSCULAR
  Filled 2020-04-16: qty 20

## 2020-04-16 MED ORDER — CLONIDINE HCL 0.1 MG/24HR TD PTWK
0.1000 mg | MEDICATED_PATCH | TRANSDERMAL | Status: DC
  Administered 2020-04-16 – 2020-05-28 (×6): 0.1 mg via TRANSDERMAL
  Filled 2020-04-16 (×8): qty 1

## 2020-04-16 MED ORDER — CARBAMAZEPINE 200 MG PO TABS
400.0000 mg | ORAL_TABLET | Freq: Three times a day (TID) | ORAL | Status: DC
  Administered 2020-04-16 – 2020-04-23 (×21): 400 mg via ORAL
  Filled 2020-04-16 (×26): qty 2

## 2020-04-16 MED ORDER — ZIPRASIDONE MESYLATE 20 MG IM SOLR
20.0000 mg | Freq: Once | INTRAMUSCULAR | Status: AC
  Administered 2020-04-16: 11:00:00 20 mg via INTRAMUSCULAR
  Filled 2020-04-16: qty 20

## 2020-04-16 MED ORDER — ZIPRASIDONE MESYLATE 20 MG IM SOLR: 40.0000 mg | Freq: Once | INTRAMUSCULAR | Status: DC

## 2020-04-16 NOTE — Consult Note (Addendum)
[facility] Face-to-Face Psychiatry Consult   Reason for Consult:  suicidal tied phone cord around her neck Referring Physician:  Dr. Blanchard Mane Patient Identification: Anita Davis MRN:  [id number] Principal Diagnosis: Delirium due to multiple etiologies Diagnosis:  Principal Problem:   Delirium due to multiple etiologies Active Problems:   Acute metabolic encephalopathy   Hypokalemia   Polysubstance abuse (HCC)   Elevated CK   Transaminitis   Refractory seizure (HCC)   Chronic hepatitis C without hepatic coma (HCC)   Distended abdomen   Palliative care encounter   Colonic Ileus (HCC)   Organic brain syndrome   On enteral nutrition   Physical deconditioning   Protein-calorie malnutrition (HCC)   Inadequate oral nutritional intake   Impulse disorder   Total Time spent with patient: 20 minutes  Subjective:   Anita Davis is a 34 y.o. female patient with a history of polysubstance use disorder, who presented to [facility] in Sep with encephalopathy. EEG showed status epilepticus arising from the left central region. She was treated with Keppra, Vimpat and Dilantin and her mental status reportedly improved. She was transferred to [facility] for further work up. Per chart review, her mental status has fluctuated.  Her mental status has fluctuated from having her eyes open, non-verbal and not following commands, to answering questions normally the next day. Neurology is involved and her antiepileptic has been tapered down. Psychiatry is consulted again for behavioral issues including her trying to wrap telephone cord around her neck last night.    HPI:    - VS reviewed. SBP 80-150/DBP50s-80s. HR 60s-104 max Tmax 97.5 in Dec.  - According to the note documented by Dr. Maylon Cos at [facility] on 02/08/2020 "The underlying etiology of her seizures and presumed catatonia may be an autoimmune encephalitis. Alternatively, this may be a primary psychiatric disorder. The [facility] CSF  paraneoplastic panel was sent but CSF autoimmune encephalopathy panel may have antibodies not available in the former, and serum autoimmune encephalopathy panel is more sensitive than the serum.  Unfortunately, we are unable to send these tests as she has gotten IVIG which may lead to false positive results (pooled donor Ig). In terms of empiric therapy, she has gotten IV solumedrol and IVIG so we would not offer additional acute therapy. Rituximab may be an option but this can be discussed as an outpatient."   The patient was found to be in the hallway, being argumentative with the nurse. She was later found to be in her room, sitting up in the bed, being surrounded by law enforcement for injection to be given. She repeatedly states "take me to jail," and does not answer to any other questions. However, she engages in a conversation with the nurse at times at the bedside, and states that she wants to see her children. She does not answer why she wants to go to jail, while she states that "that is where I am supposed to be." She is able to tell her name, date of birth, and the situation that she is in the hospital. Noted that during this exam, she reports her frustration against people with "religion," stating that she has beliefs in one of religions.   Past Psychiatric History: Please see initial evaluation for full details. I have reviewed the history. No updates at this time.     Risk to Self:  yes Risk to Others:  yes Prior Inpatient Therapy:  see initial eval. No update Prior Outpatient Therapy:  see initial eval. No update  Past Medical History:  Past Medical History:  Diagnosis Date  . Allergy   . Bronchitis   . Hepatitis-C   . History of gestational diabetes   . HPV in female   . Substance abuse [facility])     Past Surgical History:  Procedure Laterality Date  . HEMORRHOID BANDING  age 38  . IR GASTROSTOMY TUBE REMOVAL  04/06/2020  . IR REPLC GASTRO/COLONIC TUBE PERCUT W/FLUORO   02/11/2020  . LAPAROSCOPIC GASTROSTOMY N/A 02/05/2020   Procedure: LAPAROSCOPIC GASTROSTOMY PLACEMENT;  Surgeon: Gaynelle Adu, MD;  Location: [facility] OR;  Service: General;  Laterality: N/A;   Family History:  Family History  Problem Relation Age of Onset  . Depression Mother   . Cervical cancer Mother   . Hypertension Father   . Diabetes Father    Family Psychiatric  History: Please see initial evaluation for full details. I have reviewed the history. No updates at this time.    Social History:  Social History   Substance and Sexual Activity  Alcohol Use No     Social History   Substance and Sexual Activity  Drug Use Not Currently  . Types: IV, Cocaine, Hydrocodone, Oxycodone, Marijuana, Heroin   Comment: takes methadone- now taking subutex (03/27/19)    Social History   Socioeconomic History  . Marital status: Single    Spouse name: Not on file  . Number of children: Not on file  . Years of education: Not on file  . Highest education level: Not on file  Occupational History  . Not on file  Tobacco Use  . Smoking status: Current Every Day Smoker    Years: 24.00    Types: Cigarettes, Cigars  . Smokeless tobacco: Never Used  . Tobacco comment: 1 cigar/daily. former 1 pack cigarette smoker quit 05/2019  currently smokes CBD  Vaping Use  . Vaping Use: Every day  . Substances: Nicotine, Flavoring  Substance and Sexual Activity  . Alcohol use: No  . Drug use: Not Currently    Types: IV, Cocaine, Hydrocodone, Oxycodone, Marijuana, Heroin    Comment: takes methadone- now taking subutex (03/27/19)  . Sexual activity: Not Currently    Birth control/protection: Abstinence  Other Topics Concern  . Not on file  Social History Narrative  . Not on file   Social Determinants of Health   Financial Resource Strain: Not on file  Food Insecurity: Not on file  Transportation Needs: Not on file  Physical Activity: Not on file  Stress: Not on file  Social Connections: Not on file    Additional Social History:    Allergies:  No Known Allergies  Labs: No results found for this or any previous visit (from the past 48 hour(s)).  Current Facility-Administered Medications  Medication Dose Route Frequency Provider Last Rate Last Admin  . acetaminophen (TYLENOL) tablet 650 mg  650 mg Oral Q6H PRN Russella Dar, NP   650 mg at 04/16/20 1248  . butalbital-acetaminophen-caffeine (FIORICET) 50-325-40 MG per tablet 1 tablet  1 tablet Oral Q6H PRN Russella Dar, NP      . carbamazepine (TEGRETOL) tablet 400 mg  400 mg Oral BID Russella Dar, NP   400 mg at 04/16/20 0850  . Chlorhexidine Gluconate Cloth 2 % PADS 6 each  6 each Topical Daily Gaynelle Adu, MD   6 each at 04/14/20 1015  . clonazePAM (KLONOPIN) tablet 2 mg  2 mg Oral Q6H Russella Dar, NP   2 mg at 04/16/20 0851  .  diclofenac Sodium (VOLTAREN) 1 % topical gel 4 g  4 g Topical QID Russella DarEllis, Allison L, NP   4 g at 04/16/20 0857  . escitalopram (LEXAPRO) tablet 10 mg  10 mg Oral QHS Russella DarEllis, Allison L, NP   10 mg at 04/15/20 2032  . feeding supplement (ENSURE ENLIVE / ENSURE PLUS) liquid 237 mL  237 mL Oral TID BM Kathlen ModyAkula, Vijaya, MD   237 mL at 04/16/20 0859  . Gerhardt's butt cream 1 application  1 application Topical TID Narda BondsNettey, Ralph A, MD   1 application at 04/16/20 [phone] [id number]  . Gerhardt's butt cream   Topical PRN Narda BondsNettey, Ralph A, MD   Given at 03/07/20 1540  . ibuprofen (ADVIL) tablet 600 mg  600 mg Oral Q6H PRN Russella DarEllis, Allison L, NP   600 mg at 04/15/20 0900  . melatonin tablet 5 mg  5 mg Oral QHS Russella DarEllis, Allison L, NP   5 mg at 04/14/20 2056  . multivitamin with minerals tablet 1 tablet  1 tablet Oral Daily Russella DarEllis, Allison L, NP   1 tablet at 04/16/20 0850  . naltrexone (DEPADE) tablet 50 mg  50 mg Oral Daily Russella DarEllis, Allison L, NP   50 mg at 04/16/20 0850  . PHENobarbital (LUMINAL) tablet 64.8 mg  64.8 mg Oral BID Russella DarEllis, Allison L, NP   64.8 mg at 04/16/20 0851  . phenytoin (DILANTIN) chewable tablet 50 mg  50 mg Oral  BID Russella DarEllis, Allison L, NP   50 mg at 04/16/20 0850  . polyethylene glycol (MIRALAX / GLYCOLAX) packet 17 g  17 g Oral Daily Manuela SchwartzMorrison, Brenda, NP   17 g at 04/16/20 0850  . pyridOXINE (VITAMIN B-6) tablet 100 mg  100 mg Oral Daily Russella DarEllis, Allison L, NP   100 mg at 04/15/20 0901  . sodium chloride 0.9 % bolus 500 mL  500 mL Intravenous Once Kathlen ModyAkula, Vijaya, MD      . thiamine tablet 100 mg  100 mg Oral Daily Russella DarEllis, Allison L, NP   100 mg at 04/16/20 0851  . ziprasidone (GEODON) capsule 40 mg  40 mg Oral 2 times per day Russella DarEllis, Allison L, NP   40 mg at 04/16/20 [id number]    Musculoskeletal: Strength & Muscle Tone: N/A Gait & Station: normal Patient leans: N/A  Psychiatric Specialty Exam: Physical Exam Vitals and nursing note reviewed.     Review of Systems  Unable to perform ROS: Other  patient refuses to answer questions  Blood pressure 106/72, pulse 88, temperature 97.8 F (36.6 C), resp. rate 18, height 5\' 5"  (1.651 m), weight 71.6 kg, SpO2 100 %.Body mass index is 26.27 kg/m.  General Appearance: Fairly Groomed  Eye Contact:  Minimal  Speech:  Clear and Coherent  Volume:  Normal, loud at times  Mood:  does not answer  Affect:  irritable, labile  Thought Process:  Coherent, but refuses to answer many of questions, and disorganized at times  Orientation:  Full (Time, Place, and Person)  Thought Content:  she refuses to answer questions. no obvious delusion  Suicidal Thoughts:  she refuses to answer questions  Homicidal Thoughts:  she refuses to answer questions  Memory:  unable to assess as the patient refuses to answer questions  Judgement:  Poor  Insight:  Lacking  Psychomotor Activity:  Increased  Concentration:  Concentration: Fair and Attention Span: Fair  Recall:  unable to assess as the patient refuses to answer questions  Fund of Knowledge:  Fair  Language:  Good  Akathisia:  unable to assess as the patient refuses to answer questions  Handed:  Right  AIMS (if indicated):      Assets:  Others:  none  ADL's:  Intact  Cognition:  WNL  Sleep:       Assessment Ayde Record is a 34 y.o. year old female with a history of polysubstance use disorder, who presented to [facility] in Sep with encephalopathy. EEG showed status epilepticus arising from the left central region. She was treated with Keppra, Vimpat and Dilantin and her mental status reportedly improved. She was transferred to [facility] for further work up. Per chart review, her mental status has fluctuated.  Her mental status has fluctuated from having her eyes open, non-verbal and not following commands, to answering questions normally the next day. Neurology is involved and her antiepileptic has been tapered down. Psychiatry is consulted again for behavioral issues including her trying to wrap telephone cord around her neck last night.   # Altered mental status # r/o delirium due to multiple etiologies # r/o post ictal psychosis # r/o limbic encephalitis Exam is notable for hostility (while no physical aggression during the exam), minimally engaging in the interview, and occasionally displays disorganized thought process. She is observed to be alert, oriented to self and situation, although she does not answer questions otherwise but repeatedly states that "take me to jail. Per chart review, her mental status has fluctuated, which suggests a diagnosis of delirium. Differential of altered mental status includes delirium due to multiple etiologies, post ictal psychosis, limbic encephalitis. Re-consult neurology would be helpful to rule out medical cause contributing to her altered mental status. Noted that although the exam was limited, there were no obvious signs to concern for catatonia. Would recommend starting clonidine for agitation. Will continue Geodon scheduled and have prn available to target agitation while monitoring EKG for QTc prolongation.   Noted that she would not be a good candidate for Depakote  given increased LFT. Will continue clonazepam at this time, although benzodiazepine is less preferred for patients with delirium; consider tapering it off in the future while monitoring any signs concerning for catatonia. Would recommend delirium precaution as below.    # SI behavior She was not cooperative during the exam, and she does not answer questions as to why she tried to wrap a code around her neck yesterday.  It is difficult to discern whether this was done with suicidal intent and/or it was in the context of delirium, maladaptive behaviors secondary to ineffective coping skills. She was IVC'd according to the team.  I agree to continue IVC to ensure safety with safety precautions. Will continue lexapro for depression at this time.   # Polysubstance use disorder Known history of polysubstance use disorder.  Will continue naltrexone at this time while monitoring LFT.   Treatment Plan Summary: Plan as below - Continue IVC due to high risk of self harm and others.  -Continue safety precaution and sitter for unpredicted behavior - Consider re-consult neurology to rule out medical cause contributing to altered mental status (limbic encephalitis, postictal psychosis) - Start clonidine 0.1 mg twice a day for agitation.  Hold for dizziness, orthostatic hypotension.   - Continue Geodon 40 mg BID  - Consider Geodon 20 mg IM q6hprn for severe agitation  - Monitor EKG to rule out QTc prolongation. Would advise discontinuation of Geodon if QTc>500 msec - Continue lexapro 10 mg daily  - Continue naltrexone 50 mg daily  - Continue melatonin  -  Consider tapering off Flexeril if this is medically appropriate to minimize potential side effect from anticholinergic effects - Recheck LFT  - Continue to monitor and treat underlying medical causes of delirium, including infection, electrolyte disturbances, etc. - Delirium precautions - Minimize/avoid deliriogenic meds including: anticholinergic, opiates,  benzodiazepines           - Maintain hydration, oxygenation, nutrition           - Limit use of restraints and catheters           - Normalize sleep patterns by minimizing nighttime noise, light and interruptions by                clustering care, opening blinds during the day           - Reorient the patient frequently, provide easily visible clock and calendar           - Provide sensory aids like glasses, hearing aids           - Encourage ambulation, regular activities and visitors to maintain cognitive stimulation   Discussed the above recommendation with Dr. Jerolyn Center. She plans to consult neurology next Monday.   Disposition: Patient does not meet criteria for psychiatric inpatient admission. pending neurology evaluation  Thank you for your consult. Psychiatry will sign off patient at this time. Please consult psychiatry again as needed to evaluate whether the patient will benefit from psychiatry admission after hopefully being evaluated by neurology to rule out medical cause contributing to altered mental status.   Neysa Hotter, MD 04/16/2020 2:05 PM

## 2020-04-16 NOTE — Progress Notes (Addendum)
This is a 34 year old female with multiple multiple psychiatric comorbidities.  I have not examined the patient since she is delirious and dangerous to get close to.  She has been in the hospital for 102 days. Yesterday evening nurse ROA called me around 6:55 PM reporting that patient is agitated delirious and wanting to leave AMA.  When nurse left the patient's room patient wrapped a phone cord around her neck which was unwrapped rapidly by the staff.  I returned my call to the nurse to page me as soon as I received a page her on amion and gave an order for Ativan 2 mg IM.  Patient already received Geodon earlier yesterday morning. Again today patient became delirious came out of her room constantly saying she wants to go home and see her children she was upset her phone from the room was taken out.  We gave the phone back to her with supervision and she called 911 right away.  Security was called.  Patient was given Geodon 20 mg this morning IM.  Thereafter she calmed down a bit. Psychiatry was reconsulted who recommends clonidine plus or minus Depakote as a mood stabilizer. Patient has history of hepatitis C we will check LFTs. I called patient's father with no response.  Then I called patient's stepmother and asked if she could come see the patient in the hospital today.  She says she will try. Will reconsult neurology on Monday to assess and reassess her seizure situation may need a repeat EEG done. This patient needs a psychiatric structured inpatient support and treatment.  Discussed with psych here. I will obtain EKG CBC CMP. She was IVCD. D/w father at length.he is the primary contact and decision maker and the next of kin.he is aware the quality of life kalika has. He expressed the desire to make her DNR /DNI. Patient had multiple episodes of violent behaviors today for which Geodon was administered.she fights to get an ekg.she has been restrained for her safety and the safety of others.

## 2020-04-17 MED ORDER — ZIPRASIDONE MESYLATE 20 MG IM SOLR
20.0000 mg | Freq: Once | INTRAMUSCULAR | Status: AC
  Administered 2020-04-17: 16:00:00 20 mg via INTRAMUSCULAR
  Filled 2020-04-17: qty 20

## 2020-04-17 MED ORDER — STERILE WATER FOR INJECTION IJ SOLN
INTRAMUSCULAR | Status: AC
  Administered 2020-04-17: 16:00:00 10 mL
  Filled 2020-04-17: qty 10

## 2020-04-17 MED ORDER — ZIPRASIDONE MESYLATE 20 MG IM SOLR
20.0000 mg | Freq: Once | INTRAMUSCULAR | Status: AC
  Administered 2020-04-17: 23:00:00 20 mg via INTRAMUSCULAR
  Filled 2020-04-17: qty 20

## 2020-04-17 MED ORDER — ZIPRASIDONE MESYLATE 20 MG IM SOLR
20.0000 mg | Freq: Once | INTRAMUSCULAR | Status: AC
  Administered 2020-04-17: 12:00:00 20 mg via INTRAMUSCULAR
  Filled 2020-04-17: qty 20

## 2020-04-17 MED ORDER — STERILE WATER FOR INJECTION IJ SOLN
INTRAMUSCULAR | Status: AC
  Filled 2020-04-17: qty 10

## 2020-04-17 NOTE — Progress Notes (Signed)
Pt began banging the wall of their bed. This Clinical research associate asked pt if they wanted to color instead, using another form of letting out their frustration, pt only yelled "f*ck you" then kicked their bedside table to this writer having all their things fall off the bedside table. Bedside table removed from being near the pt. Pt continues to be verbally aggressive. Will continue to monitor pt.

## 2020-04-17 NOTE — Progress Notes (Signed)
Pt.s sister, [name], is visiting pt. Brought a box of cookies, 3 coloring books, a Dr.Pepper and a crayon box. Pt.s sister asked for a quick update. Will continue to monitor pt.

## 2020-04-17 NOTE — Progress Notes (Signed)
After pt had a phone call with their family member, they proceeded to throw their box of crayons to this Clinical research associate and their books. Pt states "I am going to kick the s*it out of you". Due to pt.s violent behavior, the box of crayons have been taken from pt. Will continue to monitor pt.

## 2020-04-17 NOTE — Progress Notes (Signed)
Called [employer] when [employer] stopped me in the hallway asking about pt. Pt father said that if Ms. Clinard was here, she could visit with [facility] just today, but NO INFORMATION SHOULD BE SHARED WITH HER.  Physiological scientist.

## 2020-04-17 NOTE — Progress Notes (Signed)
Pt.s breakfast has arrived. Pt had sat up and was eating her breakfast. Then the very next moment, pt seemed down. This Clinical research associate asked pt if everything is okay, but patient only shook their head indicating no. Told pt if she would like to talk about it, we are here for her. Pt remained quiet. RN aware of pt feeling a little anxious and down at the moment. Will continue to monitor pt.

## 2020-04-17 NOTE — Progress Notes (Signed)
Pt became agitated. This writer attempted to stop pt from walking out of the room, because pt had stated they had ideas of eloping. Pt went out to the hallway, yelling and screaming. This writer pressed the duress button, 2 Rn's arrived and attempted to verbally redirect pt. Pt was too agitated to be verbally redirected. Pt then proceeded to attempt to run away and yelling "I want to go HOME. I AM AN ADULT AND I CAN GO HOME". Security was called. Security arrived and helped deescalate the situation. Pt was brought back into the room, 5 point restraint applied onto pt. All wrist restraints and abdomen restraint. Will continue to monitor pt.

## 2020-04-17 NOTE — Progress Notes (Signed)
Anita Davis had a quiet night.  Again this morning she started to be delirious yelling kicking ran out of her room came to the hallway talking foul language call 911 twice. She was given Geodon 20 mg IM stat this afternoon.  This seemed to have calmed her down. I have also increased her Tegretol to 400 mg 3 times a day from 2 times a day.

## 2020-04-17 NOTE — Progress Notes (Signed)
After pt took her nap from medication, pt sat up and began biting her restraints off. This writer told pt that we would need to calm down before restraints would be taken off. Pt became agitated, by yelling at staff members, using profanity, kicking their bed rails, biting restraints and would have conversations with people who are not in the room. Pt has continuously done this for the past 2 hours. This writer attempted to calm down pt, but pt would continue to act out. RN is aware of patients actions. Will continue to monitor pt.

## 2020-04-17 NOTE — Progress Notes (Signed)
Patient confused, yelling at people that are not in the room. Sitter at bedside, patient yelling at him, and stating" you're the devil". Attempted to reorient and calm patient, but not successful at this time.

## 2020-04-17 NOTE — Progress Notes (Signed)
Pt has called 911 again, this writer has already explained to pt that phone privileges will be taken away. Pt only stated "then let me go. Let me f*cking go and we won't have any problems.". Told pt that we had to talk to a doctor first. Pt only stated "bulls*it, you all are lying. There is not doctor here. I'm not st*pid". Will continue to monitor pt.

## 2020-04-17 NOTE — Progress Notes (Signed)
Pt called 911. This Clinical research associate has told pt it is very inappropriate to call 911 when she pleases. Pt continues to say "I need my medications", "I need something for anxiety" and "I am being held against my will here". Pt has also continuously pressed the call bell, despite being told by Secretary that the RN will arrive in pt.s room soon. Pt visibly seems in discomfort. This writer has attempted to calm pt down, but pt has been verbally aggressive stating "I will hurt you with this phone if you don't leave me alone." while lifting up phone in attempt to throw it. Will continue to monitor pt.

## 2020-04-17 NOTE — Progress Notes (Signed)
Signed         Show:Clear all [x] Manual[] Template[] Copied  Added by: [x] , RN   [] Hover for details  Patient very agitated this morning. Sitter at bedside but to agitated to be redirected. Patient left room and walked to nurses station asking to leave. Patient is IVC status. Contacted provider for STAT order of calming agent. Security called to assist in helping patient back to room and applying restraints. Patient is spitting and being verbally and physically abusive while trying to apply restraints. Will continue to monitor.

## 2020-04-18 DIAGNOSIS — F05 Delirium due to known physiological condition: Secondary | ICD-10-CM

## 2020-04-18 MED ORDER — STERILE WATER FOR INJECTION IJ SOLN
INTRAMUSCULAR | Status: AC
  Administered 2020-04-18: 15:00:00 1.2 mL
  Filled 2020-04-18: qty 10

## 2020-04-18 MED ORDER — ESCITALOPRAM OXALATE 20 MG PO TABS
20.0000 mg | ORAL_TABLET | Freq: Every day | ORAL | Status: DC
  Administered 2020-04-18 – 2020-04-27 (×10): 20 mg via ORAL
  Filled 2020-04-18 (×10): qty 1

## 2020-04-18 MED ORDER — LORAZEPAM 2 MG/ML IJ SOLN: 1.0000 mg | Freq: Once | INTRAMUSCULAR | Status: DC

## 2020-04-18 MED ORDER — ZIPRASIDONE MESYLATE 20 MG IM SOLR
20.0000 mg | Freq: Two times a day (BID) | INTRAMUSCULAR | Status: DC | PRN
  Administered 2020-04-18 – 2020-04-19 (×2): 20 mg via INTRAMUSCULAR
  Filled 2020-04-18 (×5): qty 20

## 2020-04-18 MED ORDER — LORAZEPAM 2 MG/ML IJ SOLN: 2.0000 mg | INTRAMUSCULAR | Status: DC | PRN

## 2020-04-18 MED ORDER — PHENOBARBITAL 32.4 MG PO TABS
32.4000 mg | ORAL_TABLET | Freq: Two times a day (BID) | ORAL | Status: DC
  Administered 2020-04-18: 09:00:00 32.4 mg via ORAL
  Filled 2020-04-18: qty 1

## 2020-04-18 MED ORDER — PHENOBARBITAL 32.4 MG PO TABS: 16.2000 mg | ORAL_TABLET | Freq: Two times a day (BID) | ORAL | Status: DC

## 2020-04-18 NOTE — Progress Notes (Addendum)
Pt calm and cooperative. Pt removed herself from bilateral wrist restraints. NP Manuela Schwartz made aware.

## 2020-04-18 NOTE — Progress Notes (Addendum)
[facility] PROGRESS NOTE  Anita Davis ZOX:[id number] DOB: 01-02-1986 DOA: 01/04/2020 PCP: Anita Davis              03/30/20                         04/08/20    Status: Remains inpatient appropriate because:Altered mental status and Unsafe d/c plan  Dispo:  The patient is from: Home              Anticipated d/c is to: SNF vs home with family.  Extensive conversation had with patient's stepmother Anita Davis.  She has been in contact [facility] that apparently accept patients with neuro psychiatric issues.  It is best for patient if we are able to locate a facility that is close to family so they can visit her daily              Anticipated d/c date is: > 3 days              Patient currently is medically stable to d/c.  Barrier to discharge:SNF placement at a facility that can accommodate behavioral issues similar to dementia patients, Medicaid and disability applications in process.Anita Davis reviewing chart (family's preference since is close to them and would allow daily visits)   Code Status: Full Family Communication: Stepmother Anita Davis father at bedside on 12/17 DVT prophylaxis: 12/15 consistently ambulatory therefore will discontinue Lovenox Vaccination status: Family confirmed initial Pfizer Covid vaccine dose given in June, second dose given on 11/2  Pertinent social history Patient has 3 children: Anita Davis, 42[name] and Liz Davis Anita's father has primary custody and no one in the family knows where they reside With permission from patient's father and stepmother I contacted Anita Davis (telephone number [phone]) who has legal custody of Anita Davis and Liz Davis; at this juncture Anita Davis states that after extensive discussions with Anita Davis he no longer wishes to visit his mother in person.  Liz Davis is only 7 and it is not appropriate for him to visit his mother at this point.  Prior to the patient's acute illness Anita Davis  states that Anita Davis had regular frequent supervised visits with these children.  The possibility of children recording video greetings to their mother has been discussed and she will need to discuss this with her husband before agreeing. Anita Davis is agreeable to keeping a steady line of communication open with the patient's stepmother Anita Davis After multiple conversations with other family members patient's aunt Anita Davis is no longer allowed on the visitation list and is not to be given any information patient status  Foley catheter: No  HPI: 34 year old female with known history of substance abuse on prior methadone, active hepatitis C and gestational diabetes who presented initially with confusion.  She was initially admitted to [facility] in September with a diagnosis of encephalopathy presumed secondary to drug overdose.  Subsequent work-up inconclusive.  LP without signs of infection, MRI brain unremarkable and unfortunately she remained encephalopathic.  EEG did reveal focal seizures.  AED therapy was initiated and seizures were suppressed and [facility].  Apparently had episodes of status epilelepticus before full suppression of seizures with medications.  Subsequently neurological recovery was poor and delayed.  Neurology suspicious for autoimmune encephalitis and was given high-dose steroids with equivocal improvement but deteriorated after steroids completed.  Was given IVIG without any significant improvement in mentation.  For the past several months she has had waxing and waning mentation ranging from unresponsive to alert confused and verbal and at times restless and agitated.  When she is awake/alert she is able to ambulate but requires assistance due to impulsivity and gait instability.  Unfortunately these episodes of wakefulness were never sustained.  During the last week of November the decision was made to taper and discontinue  all psychotropic medications due to lack of response and altered mentation.  Once this was accomplished patient became very alert and agitated but remained confused. Over time with adjustment of psychotropic medications patient remained awake, was able to feed herself and ambulate with minimal assistance.  She has demonstrated issues with impulsivity, anxiety and disinhibition including hypersexuality.  Currently symptoms are managed with Seroquel, Depakote, naltrexone, and Vistaril.  Remains inconsistent with verbal communication noting at times she is almost mute with severe flat affect versus being extremely appropriate and as her family describes "back to being Anita Davis again".  Patient is able to write out her thoughts and feelings in a notebook.  Most of the writings are incoherent at times and her sentence structure appears more consistent with gibberish as opposed to true communication.  Her best responses are simple 1-2 word responses/ phrases.  She appears to be extremely depressed. She perseverates on not having custody of her children and is confused believing that this is an acute event that is occurring now as opposed to something that had occurred several years prior to current admission. She has written phrases such as "I am a dumbass" and "please forgive me God" but does not have enough insight to elaborate when further questioned.  The palliative care and psychiatric teams concurrently evaluated the patient on 12/14 continue to encourage her to write down her thoughts and feelings.    In regards to her admission history of status epilepticus in the context of no prior history of epilepsy, Dr. Melynda Ripple with neurology was contacted regarding the possibility of weaning and discontinuing antiepileptic medications. She has had no further seizures for greater than 2 months. Neurology recommendation is to begin slowly tapering and discontinuing antiepileptic drugs monitoring for any reemergence of seizure  activity.   PEG tube has been discontinued.  Patient is eating consistently and taking meds by mouth without difficulty.  Although her mentation has not returned to her preadmission baseline there is no indication at this juncture to continue safety sitter even with her underlying impulsivity.  She is able to ambulate without difficulty and maintained steady appropriate gait.  She is not a fall risk at this juncture.  She was not requiring safety restraints.  Starting on 12/24 in the evening patient became extremely agitated and required multiple security officers to restrain her.  She was alternating between being paranoid and fearful that she was being unlawfully incarcerated and called 911 repeatedly until finally removed from room vs being extremely belligerent trying attempting to leave room, using foul language, using devices including phone cord in room to attempt to strangle self.  Behaviors do improve with intermittent administration of IM Geodon on top of scheduled Geodon but have been reemerging thus requiring one-to-one sitter and continued safety restraints.  She was emergently evaluated by psychiatry with recommendation to begin clonidine to help with agitation otherwise continue medications as ordered.  They did recommend transitioning from Tegretol to Depakote if LFTs trend upward.  Also over the weekend attending discussed with rehab physician extender who recommended increasing frequency of current Tegretol.  Subjective: Upon  my evaluation of patient this morning she was sedated and cooperative but drifted back off to sleep.  RN stated that 10 AM meds had to be given at 8 AM due to patient wandering out of room and wanting to leave AMA.   Objective: Vitals:   04/17/20 2322 04/18/20 0455  BP: 107/71 109/75  Pulse: 89 84  Resp: 18 17  Temp: 97.7 F (36.5 C) 97.7 F (36.5 C)  SpO2: 98% 98%    Intake/Output Summary (Last 24 hours) at 04/18/2020 1229 Last data filed at 04/18/2020  0524 Gross per 24 hour  Intake 480 ml  Output 600 ml  Net -120 ml   Filed Weights   03/29/20 0500 04/04/20 9563 04/05/20 8756  Weight: 72.5 kg 70.9 kg 71.6 kg    Exam: General: Currently sleeping/sedated and in no acute distress Pulmonary: Lateral lung sounds clear to auscultation anteriorly without increased work of breathing, room air Cardiac: Heart sounds S1-S2 with no murmurs, pulses regular nontachycardic, SBP greater than 90 Abdomen:, Nontender nondistended, bowel sounds present.  Patient awakened and began eating packaged cookies at bedside. LBM 12/23 Neurological: Moves all extremities x4, no focal neurological deficits, no tremors or rigidity/EPS sx's Psychiatric: Currently sleeping and sedated.  As of early this morning according to report from nurse patient remains confused and does not have insight into rationale for hospitalization.     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 Unfortunately her brain injury sequela (agitation, confusion and hallucinations) reemerged on lower dose of Seroquel therefore decision made to discontinue in favor of PO Geodon. Typically after receiving IM Geodon patient becomes calm with improved orientation, able to perform ADLs independently and respond to questions verbally/by written word  -Patient had been started on p.o. Geodon during this admission-12/23 increased dose from 20 mg BID to 40 mg-QTC stable at 440 ms. -Beginning on 12/20 4 in the evening patient behavior became more more difficult to manage with significant outbursts of anger and agitation.  Continue as needed Geodon on top of scheduled Geodon.  Psych following.  Have noticed this change in patient's  behavior as AEDs have been weaned.  As of today phenobarbital being weaned.  It is noted that phenobarb can cause increased agitation in some patients.  Patient is also on very high dose Klonopin every 6 hours.  Benzodiazepines, especially high dose, can increase agitation so may need to decrease dose and follow. -Patient received first dose of Lexapro 10 mg on 12/24 and today we will increase to 20 mg which is maximum dose -On melatonin for insomnia (trazodone and Ambien seem to cause worsening of insomnia and agitation) -Vimpat and Dilantin have been tapered and discontinued.  Starting 12/27 will taper and discontinue phenobarbital -Continue naltrexone for hyper sexualization (patient with history of PTSD and narcotic abuse prior to admission).  Because of requirement for this medication unable to treat patient with methadone she has been intermittently requesting. -Continue Tegretol increase dose to 400 mg tid behavioral disturbances, agitation and impulsivity (this will also function as an AED if needed)-LFTs increase can transition to Depakote per recommendation of psychiatry -See below regarding treatment of anxiety with Vistaril and Klonopin D/w Pam/CIR- rec having pt fu with the [facility] if able to be dc'd home  Severe anxiety -As patient's brain injury  symptoms are appropriately treated and she becomes more alert and able to communicate she is now expressing issues regarding significant anxiety and panic attack symptoms requesting pharmacotherapy -Upon review of drug to drug interactions with Geodon Vistaril can cause increased psychomotor impairment well is increased risk for QTC prolongation therefore this medication was discontinued as of 12/24. -Continue high-dose Klonopin as above along with Lexapro and Depakote-as noted may need to decrease Klonopin dose  Hyperglycemia secondary to medication -Resolved -Hemoglobin A1c 5.8 consistent with prediabetes -Antipsychotics such as  Seroquel and Zyprexa can cause hyperglycemia and drug-related diabetes mellitus we will continue to follow closely; patient has been having increased thirst and increased urination  Acute on chronic depression prior to admission/known polysubstance abuse/suicidal ideation/PTSD -Attempted to maximize depression therapy but seem to make patient's mentation worse and did not improve symptoms therefore Zoloft 100 mg and Abilify 2 mg discontinued after being tapered -Seroquel contributed to depression and suicidal ideation but unfortunately with lower doses brain injury sequela reemerged therefore Seroquel discontinued in favor of Geodon as described above. -Reexamined by palliative medicine and psychiatry on 12/14.  Please see their notes for details.  Patient agreeable to outpatient psych follow-up well is consideration for outpatient ECT if indicated/candidate.  This has been discussed with patient's family i.e. her stepmother and father and if patient stable enough to discharge to home environment they have planned on pursuing this option.  -Above regarding adjustments in psychotropic medications, behavioral changes and requirement for safety sitter/restraints -12/27 neurology reconsulted medical causes to patient's acute delirium.      Other problems: Nonobstructive transaminitis in context of hepatitis C -Elevated HCV antibody with markedly elevated HCV RNA quantitative level  consistent with chronic hepatitis C  -LFTs remain stable without an obstructive pattern noted -11/10: Discussed with ID Dr. Manson Passey.  Treatment of hepatitis C typically is initiated in the outpatient setting.  Medications can be quite expensive and usually take time to obtain insurance approval.   -ID recommends scheduling outpatient appointment their clinic closer to discharge date  Back pain 2/2 abnormal urinalysis consistent with UTI -Patient with low-grade fever overnight and is now having back pain for days not  responsive to NSAIDs and muscle relaxers -DG lumbar spine unremarkable -During hospitalization patient has been treated for pansensitive E. coli UTI as well as Salmonella UTI both of which have been sensitive to Levaquin -Completed Levaquin 12/20-urine culture obtained after antibiotics initiated and showed no growth -CT abdomen and pelvis on 12/17 unremarkable  ? Breast enlargement/gynecomastia -No clinical evidence of breast enlargement -Prolactin level low with normal FSH -Appearance of breast enlargement reported by family likely related to improve nutrition as well as abdominal binder moving up underneath breasts and malpositioning of breasts.  Diarrhea>>>Constipation Resolved Felt secondary to laxatives but given Salmonella UTI could have been related to Salmonella although at the time diarrhea was being worked up patient had no leukocytosis, no abdominal pain and no fevers.  Suspected oral Candida -Resolved after oral nystatin  Low back pain -Resolved -Completed 5 days of ibuprofen -Continue Voltaren gel and add as needed Flexeril (will DC Robaxin since there is to be ineffective)  Salmonella UTI -Completed amoxicillin  Physical deconditioning -Resolved -Does continue to exhibit impulsivity but maintains appropriate balance and gait no longer requires one-to-one safety sitter  Large hemorrhoids. -Resolved -Markedly improved-LD Proctofoam 12/15  Dysphagia -Resolved -Continue regular diet    Data Reviewed: Basic Metabolic Panel: Recent Labs  Lab 04/13/20 0142 04/16/20 1417  NA 139 140  K 3.9  3.9  CL 102 103  CO2 29 28  GLUCOSE 164* 137*  BUN 10 16  CREATININE 0.59 0.63  CALCIUM 8.8* 9.0   Liver Function Tests: Recent Labs  Lab 04/16/20 1417  AST 64*  ALT 53*  ALKPHOS 143*  BILITOT 0.2*  PROT 5.8*  ALBUMIN 3.1*   No results for input(s): LIPASE, AMYLASE in the last 168 hours. No results for input(s): AMMONIA in the last 168 hours. CBC: Recent  Labs  Lab 04/16/20 1417  WBC 5.3  HGB 12.1  HCT 37.3  MCV 98.4  PLT 334   Cardiac Enzymes: No results for input(s): CKTOTAL, CKMB, CKMBINDEX, TROPONINI in the last 168 hours. BNP (last 3 results) No results for input(s): BNP in the last 8760 hours.  ProBNP (last 3 results) No results for input(s): PROBNP in the last 8760 hours.  CBG: No results for input(s): GLUCAP in the last 168 hours.  No results found for this or any previous visit (from the past 240 hour(s)).   Studies: No results found.  Scheduled Meds: . carbamazepine  400 mg Oral TID  . Chlorhexidine Gluconate Cloth  6 each Topical Daily  . clonazePAM  2 mg Oral Q6H  . cloNIDine  0.1 mg Transdermal Weekly  . diclofenac Sodium  4 g Topical QID  . escitalopram  20 mg Oral QHS  . feeding supplement  237 mL Oral TID BM  . Gerhardt's butt cream  1 application Topical TID  . melatonin  5 mg Oral QHS  . multivitamin with minerals  1 tablet Oral Daily  . naltrexone  50 mg Oral Daily  . phenobarbital  32.4 mg Oral BID   Followed by  . [START ON 04/21/2020] phenobarbital  16.2 mg Oral BID  . polyethylene glycol  17 g Oral Daily  . vitamin B-6  100 mg Oral Daily  . thiamine  100 mg Oral Daily  . ziprasidone  40 mg Oral 2 times per day   Continuous Infusions: . sodium chloride      Principal Problem:   Delirium due to multiple etiologies Active Problems:   Acute metabolic encephalopathy   Hypokalemia   Polysubstance abuse (HCC)   Elevated CK   Transaminitis   Refractory seizure (HCC)   Chronic hepatitis C without hepatic coma (HCC)   Distended abdomen   Palliative care encounter   Colonic Ileus (HCC)   Organic brain syndrome   On enteral nutrition   Physical deconditioning   Protein-calorie malnutrition (HCC)   Inadequate oral nutritional intake   Impulse disorder   Consultants:  Neurology  Psychiatry  Interventional radiology  Surgery  Procedures:  9/14 lumbar puncture  9/15 EEG  9/16  EEG  9/17 EEG  9/19 overnight EEG with video  9/22 overnight EEG with video with discontinuation of long-term EEG monitoring on 9/25  9/24 core track placement  10/6 EEG  12/15 PEG tube removed  Antibiotics: Anti-infectives (From admission, onward)   Start     Dose/Rate Route Frequency Ordered Stop   04/05/20 1415  levofloxacin (LEVAQUIN) tablet 500 mg        500 mg Oral Daily 04/05/20 1327 04/11/20 0911   03/19/20 1000  metroNIDAZOLE (FLAGYL) tablet 500 mg  Status:  Discontinued        500 mg Per Tube 2 times daily 03/19/20 0822 03/23/20 0827   03/18/20 1200  amoxicillin (AMOXIL) 250 MG/5ML suspension 500 mg  Status:  Discontinued        500 mg Per  Tube Every 8 hours 03/18/20 0915 03/23/20 0559   03/17/20 1200  cefTRIAXone (ROCEPHIN) 1 g in sodium chloride 0.9 % 100 mL IVPB  Status:  Discontinued        1 g 200 mL/hr over 30 Minutes Intravenous Every 24 hours 03/17/20 1048 03/18/20 0913   03/16/20 1130  metroNIDAZOLE (FLAGYL) 50 mg/ml oral suspension 500 mg  Status:  Discontinued        500 mg Per Tube 2 times daily 03/16/20 1040 03/19/20 0821   03/16/20 1130  fluconazole (DIFLUCAN) 40 MG/ML suspension 152 mg        150 mg Per Tube  Once 03/16/20 1040 03/16/20 1245   02/11/20 1526  ceFAZolin (ANCEF) IVPB 2g/100 mL premix        2 g 200 mL/hr over 30 Minutes Intravenous  Once 02/11/20 1526 02/11/20 1641   02/05/20 0600  ceFAZolin (ANCEF) IVPB 2g/100 mL premix        2 g 200 mL/hr over 30 Minutes Intravenous To Short Stay 02/04/20 1054 02/05/20 0950   01/29/20 0600  ceFAZolin (ANCEF) IVPB 2g/100 mL premix        2 g 200 mL/hr over 30 Minutes Intravenous On call to O.R. 01/28/20 1511 01/30/20 0559   01/28/20 2000  cefTRIAXone (ROCEPHIN) 1 g in sodium chloride 0.9 % 100 mL IVPB        1 g 200 mL/hr over 30 Minutes Intravenous Every 24 hours 01/28/20 1925 02/02/20 0724   01/06/20 0900  cefTRIAXone (ROCEPHIN) 2 g in sodium chloride 0.9 % 100 mL IVPB  Status:  Discontinued         2 g 200 mL/hr over 30 Minutes Intravenous Every 12 hours 01/06/20 0105 01/06/20 2202   01/06/20 0800  vancomycin (VANCOCIN) IVPB 1000 mg/200 mL premix  Status:  Discontinued       "Followed by" Linked Group Details   1,000 mg 200 mL/hr over 60 Minutes Intravenous Every 12 hours 01/05/20 1904 01/06/20 2202   01/05/20 2000  vancomycin (VANCOREADY) IVPB 1500 mg/300 mL       "Followed by" Linked Group Details   1,500 mg 150 mL/hr over 120 Minutes Intravenous  Once 01/05/20 1904 01/06/20 0127   01/05/20 1830  cefTRIAXone (ROCEPHIN) 2 g in sodium chloride 0.9 % 100 mL IVPB        2 g 200 mL/hr over 30 Minutes Intravenous  Once 01/05/20 1829 01/05/20 2220       Time spent: 30 minutes    [name] ANP  [facility] 7 am- 330 pm/M-F 04/18/2020, 12:29 PM  LOS: 103 days

## 2020-04-18 NOTE — Progress Notes (Signed)
Interim history: 34 year old femalewho initially presented to [facility] on 01/06/2020 with encephalopathy. 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 Keppra was started and eventually Vimpat was added with improvementinEEG but patient continued to be altered and therefore transferred to [facility]. On arrival, EEG showed sharp waves in left central temporal region as well as lateralized rhythmic delta activity in left hemisphere which improved after adding phenytoin and therefore was most likely ictal in nature. Since then phenobarbital also been added with further improvement in EEG (no more sharp waves). Of note, patient underwent lumbar puncture at [facility] on 01/05/2020 which did not show any pleocytosis and had normal protein and glucose.  CT chest abdomen pelvis was done as a part of paraneoplastic work-up and did not show any evidence of malignancy.  Paraneoplastic panel was sent to [facility] and was negative. 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 During this whole time, patient has had fluctuating mental status.  Suddenly at times she is able to speak, follows simple commands and able to provide history for a day or 2 and then again becomes nonverbal.  She is also been seen by psych and started on benzodiazepines for possible catatonia without any change in mental status/behavior.  Keppra was also discontinued due to concern for behavioral side effects.  Since she was last seen by neurology on 02/08/2020, Vimpat and Dilantin has been weaned off.  She is currently on phenobarbital 48.6 mg twice daily for seizures as well as carbamazepine 400 mg 3 times daily and clonazepam 2 mg every 6 hours for mood stabilization.  She continues to have fluctuating mental status ranging from being unresponsive to alert confusion verbal and restless and  agitated.  Psychiatry has seen the patient 8 times with addition of medications including escitalopram 20 mg nightly, clonidine patch 0.1 mg weekly and Geodon 40 mg twice daily.  Most recently patient was seen by psychiatry on 04/16/2020 and recommended repeat neurology consult to rule out medical causes of delirium.   Subjective: NAEO. Siting in bed, cooperative. States she has intermittent chronic headaches. Denies vision disturbance, paresthesias, weakness. 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    ROS: negative except above Examination  Vital signs in last 24 hours: Temp:  [97.7 F (36.5 C)] 97.7 F (36.5 C) (12/27 0455) Pulse Rate:  [84-89] 84 (12/27 0455) Resp:  [17-18] 17 (12/27 0455) BP: (107-109)/(71-75) 109/75 (12/27 0455) SpO2:  [98 %] 98 % (12/27 0455)  General: sitting in bed coloring, NAD CVS: pulse-normal rate and rhythm RS: breathing comfortably Extremities: normal  Psych: able to provide history, did attempt to suddenly grab my key and said she thought its the key to her house  Neuro: MS: Alert, oriented to self, follows commands,no aphasia,  able to do simple addition subtraction, poor attention span, able to tell me days of week backward but wasn't able to spell WORLD backward ( said DLOWR). 0/5 in 5 min recall. CN: pupils equal and reactive,  EOMI, face symmetric, tongue midline, normal sensation over face, Motor: 5/5 strength in all 4 extremities Reflexes: 2+ bilaterally over patella, biceps, plantars: flexor Coordination: normal Gait: not tested  Basic Metabolic Panel: Recent Labs  Lab 04/13/20 0142 04/16/20 1417  NA 139 140  K 3.9 3.9  CL 102 103  CO2 29 28  GLUCOSE 164* 137*  BUN 10 16  CREATININE 0.59 0.63  CALCIUM 8.8* 9.0    CBC: Recent Labs  Lab 04/16/20 1417  WBC 5.3  HGB 12.1  HCT 37.3  MCV 98.4  PLT 334     Coagulation  Studies: No results for input(s): LABPROT, INR in the last 72 hours.  Imaging No new brain imaging   ASSESSMENT AND PLAN: 34 year old femalewho initially presented to [facility] on 01/06/2020 with encephalopathy. EEG at that time showed status epilepticus arising from left central temporal region which has since resolved. 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 Continues to have waxing and waning mental status even after adjustment in psych meds.  Encephalopathy - waxing and waning mental state, ddx include primary psych disorder vs less likely ongoing neurologic disorder ( no clear pattern/rhytmicity to change in mental status, relatively normal neuro exam, has had normal mri and repeat EEG) - Poor attention span with memory disturbance likely due to underlying pysch discorder vs sequela of suspected  autoimmune encephalitis vs  due to chronic drug use  Recommendations - Will repeat MRI brain wo contrast to look for any acute abnormality. This was difficult to obtain in past. OK to use ativan for sedation if needed - will also check serum autoimmune and paraneoplastic panel ) send out to [facility]). Negative CSF paraneoplastic panel in past. Can consider repeat LP to send CSF autoimmune panel as well but will obtain MRI brain first as it will be difficult to obtain LP in this patient without sedation  - Will dc phenobarb to minimize medication side effects although low suspicion that this is a significant contributor as phenobarb usually has mood stabilizing properties - continue seizure precautions - management of rest of comorbidities per primary team  I have spent a total of  45  minutes with the patient reviewing hospital notes,  test results, labs and examining the patient as well as establishing an assessment and plan that was discussed personally with the patient.  > 50% of time was spent in direct patient care.  Lindie Spruce [facility] For questions after 5pm please refer to AMION to reach the Neurologist on call

## 2020-04-18 NOTE — Progress Notes (Signed)
Nutrition Follow-up  DOCUMENTATION CODES:   Not applicable  INTERVENTION:   -Continue Ensure Enlive po TID, each supplement provides 350 kcal and 20 grams of protein -Continue MVI with minerals daily -Continue Magic cup TID with meals, each supplement provides 290 kcal and 9 grams of protein  NUTRITION DIAGNOSIS:   Inadequate oral intake related to lethargy/confusion as evidenced by meal completion < 25%.  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 PMH includes hepatitis and polysubstance abuse.  9/24 Cortrak placed (gastric) 10/10 Cortrak dislodged, TF held 10/11 Cortrak advanced (tip in proximal duodenum per KUB), TF restarted 10/14 TF reduced to 69ml/hr due to colonic ileus 10/15 G-tube placement 10/16 TF resumed 10/18 TF rate returned to 22ml/hr; G-tube pulled out 1 inch so TF held 10/21 G-tube replaced 10/27 diet advanced to regular 12/15 PEG tube removed  Reviewed I/O's: +120 ml x 24 hours and +6 L since 04/04/20  UOP: 600 ml x 24 hours  Pt sleeping soundly at time of visit. RD did not disturb.   Pt with good appetite; noted meal completion 50-100%. Pt is intermittently refusing Ensure Enlive supplements.   Per TOC notes, still searching for potential SNF placement.   Labs reviewed.   Diet Order:   Diet Order            Diet regular Room service appropriate? No; Fluid consistency: Thin  Diet effective now                 EDUCATION NEEDS:   No education needs have been identified at this time  Skin:  Skin Assessment: Skin Integrity Issues: Skin Integrity Issues:: Incisions Incisions:  lt upper abdomen  Last BM:  04/14/20  Height:   Ht Readings from Last 1 Encounters:  01/06/20 5\' 5"  (1.651 m)    Weight:   Wt Readings from Last 1 Encounters:  04/05/20 71.6 kg   BMI:  Body mass index is 26.27 kg/m.  Estimated Nutritional Needs:   Kcal:  1800-2000  Protein:  90-100 grams  Fluid:  >/=1.8L/d    04/07/20, RD, LDN, CDCES Registered Dietitian II Certified Diabetes Care and Education Specialist Please refer to [facility] for RD and/or RD on-call/weekend/after hours pager

## 2020-04-19 ENCOUNTER — Inpatient Hospital Stay (HOSPITAL_COMMUNITY): Payer: Medicaid Other

## 2020-04-19 LAB — MISC LABCORP TEST (SEND OUT)

## 2020-04-19 MED ORDER — CLONAZEPAM 1 MG PO TABS
1.0000 mg | ORAL_TABLET | Freq: Four times a day (QID) | ORAL | Status: DC
  Administered 2020-04-19 – 2020-04-25 (×23): 1 mg via ORAL
  Filled 2020-04-19 (×23): qty 1

## 2020-04-19 MED ORDER — ZIPRASIDONE MESYLATE 20 MG IM SOLR
20.0000 mg | Freq: Three times a day (TID) | INTRAMUSCULAR | Status: DC | PRN
  Administered 2020-04-20 – 2020-05-27 (×27): 20 mg via INTRAMUSCULAR
  Filled 2020-04-19 (×39): qty 20

## 2020-04-19 MED ORDER — HYDROXYZINE HCL 50 MG/ML IM SOLN
50.0000 mg | Freq: Once | INTRAMUSCULAR | Status: DC
  Filled 2020-04-19: qty 1

## 2020-04-19 MED ORDER — ZIPRASIDONE HCL 40 MG PO CAPS
60.0000 mg | ORAL_CAPSULE | ORAL | Status: DC
  Administered 2020-04-19 – 2020-04-25 (×12): 60 mg via ORAL
  Filled 2020-04-19 (×13): qty 1

## 2020-04-19 NOTE — Progress Notes (Addendum)
[facility] PROGRESS NOTE  Anita Davis FXT:[id number] DOB: 29-Feb-1986 DOA: 01/04/2020 PCP: [name], PA-C              03/30/20                         04/08/20    Status: Remains inpatient appropriate because:Altered mental status and Unsafe d/c plan  Dispo:  The patient is from: Home              Anticipated d/c is to: SNF vs home with family.  Extensive conversation had with patient's stepmother [name].  She has been in contact with a local nursing facility in [facility] that apparently accept patients with neuro psychiatric issues.  It is best for patient if we are able to locate a facility that is close to family so they can visit her daily              Anticipated d/c date is: > 3 days              Patient currently is medically stable to d/c.  Barrier to discharge:SNF placement at a facility that can accommodate behavioral issues similar to dementia patients, Medicaid and disability applications in process.[facility] reviewing chart (family's preference since is close to them and would allow daily visits)   Code Status: Full Family Communication: Stepmother [name] 12/28 DVT prophylaxis: 12/15 consistently ambulatory therefore will discontinue Lovenox Vaccination status: Family confirmed initial Pfizer Covid vaccine dose given in June, second dose given on 11/2  Pertinent social history Patient has 3 children: Anita Davis, 85 and Anita Davis [name]'s father has primary custody and no one in the family knows where they reside With permission from patient's father and stepmother I contacted [name] (telephone number [phone]) who has legal custody of Anita Davis and Anita Davis; at this juncture [name] states that after extensive discussions with Anita Davis he no longer wishes to visit his mother in person.  Anita Davis is only 5 and it is not appropriate for him to visit his mother at this point.  Prior to the patient's acute illness [name] states that Anita Davis  had regular frequent supervised visits with these children.  The possibility of children recording video greetings to their mother has been discussed and she will need to discuss this with her husband before agreeing. [name] is agreeable to keeping a steady line of communication open with the patient's stepmother [name] After multiple conversations with other family members patient's aunt [name] is no longer allowed on the visitation list and is not to be given any information patient status  Foley catheter: No  HPI: 34 year old female with known history of substance abuse on prior methadone, active hepatitis C and gestational diabetes who presented initially with confusion.  She was initially admitted to [facility] in September with a diagnosis of encephalopathy presumed secondary to drug overdose.  Subsequent work-up inconclusive.  LP without signs of infection, MRI brain unremarkable and unfortunately she remained encephalopathic.  EEG did reveal focal seizures.  AED therapy was initiated and seizures were suppressed and she was transferred to [facility] for continuous EEG.  Apparently had episodes of status epilelepticus before full suppression of seizures with medications.  Subsequently neurological recovery was poor and delayed.  Neurology suspicious for autoimmune encephalitis and was given high-dose steroids with equivocal improvement but deteriorated after steroids completed.  Was given IVIG without any significant improvement in mentation.  For the past  several months she has had waxing and waning mentation ranging from unresponsive to alert confused and verbal and at times restless and agitated.  When she is awake/alert she is able to ambulate but requires assistance due to impulsivity and gait instability.  Unfortunately these episodes of wakefulness were never sustained.  During the last week of November the decision was made to taper and discontinue all psychotropic  medications due to lack of response and altered mentation.  Once this was accomplished patient became very alert and agitated but remained confused. Over time with adjustment of psychotropic medications patient remained awake, was able to feed herself and ambulate with minimal assistance.  She has demonstrated issues with impulsivity, anxiety and disinhibition including hypersexuality.  Currently symptoms are managed with Seroquel, Depakote, naltrexone, and Vistaril.  Remains inconsistent with verbal communication noting at times she is almost mute with severe flat affect versus being extremely appropriate and as her family describes "back to being [place] again".  Patient is able to write out her thoughts and feelings in a notebook.  Most of the writings are incoherent at times and her sentence structure appears more consistent with gibberish as opposed to true communication.  Her best responses are simple 1-2 word responses/ phrases.  She appears to be extremely depressed. She perseverates on not having custody of her children and is confused believing that this is an acute event that is occurring now as opposed to something that had occurred several years prior to current admission. She has written phrases such as "I am a dumbass" and "please forgive me God" but does not have enough insight to elaborate when further questioned.  The palliative care and psychiatric teams concurrently evaluated the patient on 12/14 continue to encourage her to write down her thoughts and feelings.    In regards to her admission history of status epilepticus in the context of no prior history of epilepsy, Dr. Melynda Ripple with neurology was contacted regarding the possibility of weaning and discontinuing antiepileptic medications. She has had no further seizures for greater than 2 months. Neurology recommendation is to begin slowly tapering and discontinuing antiepileptic drugs monitoring for any reemergence of seizure activity.   PEG  tube has been discontinued.  Patient is eating consistently and taking meds by mouth without difficulty.  Although her mentation has not returned to her preadmission baseline there is no indication at this juncture to continue safety sitter even with her underlying impulsivity.  She is able to ambulate without difficulty and maintained steady appropriate gait.  She is not a fall risk at this juncture.  She was not requiring safety restraints.  Starting on 12/24 in the evening patient became extremely agitated and required multiple security officers to restrain her.  She was alternating between being paranoid and fearful that she was being unlawfully incarcerated and called 911 repeatedly until finally removed from room vs being extremely belligerent trying attempting to leave room, using foul language, using devices including phone cord in room to attempt to strangle self.  Behaviors do improve with intermittent administration of IM Geodon on top of scheduled Geodon but have been reemerging thus requiring one-to-one sitter and continued safety restraints.  She was emergently evaluated by psychiatry with recommendation to begin clonidine to help with agitation otherwise continue medications as ordered.  They did recommend transitioning from Tegretol to Depakote if LFTs trend upward.  Also over the weekend attending discussed with rehab physician extender who recommended increasing frequency of current Tegretol.  Subjective: Has had episodes of  extreme agitation and anger over the past 24 hours requiring Geodon IM as needed every 12 hours.  Has also been given IM Ativan as needed for agitation when Geodon not due.  Today patient has flat somewhat angry affect.  Asked if she remembered who I was and she said yes then proceeded to call me Onalee Hua and asked me where my gun was at.  Repeatedly states "I am not a pussy".  Very abrupt with responses and although capable of answering questions prefers not to.  She does  write continuously in a notebook at bedside.   Objective: Vitals:   04/18/20 2116 04/19/20 0558  BP: 109/70 104/70  Pulse: 88 72  Resp: 20 20  Temp: 98.5 F (36.9 C) 98.4 F (36.9 C)  SpO2: 100% 99%    Intake/Output Summary (Last 24 hours) at 04/19/2020 1218 Last data filed at 04/19/2020 0740 Gross per 24 hour  Intake 320 ml  Output --  Net 320 ml   Filed Weights   03/29/20 0500 04/04/20 0632 04/05/20 0613  Weight: 72.5 kg 70.9 kg 71.6 kg    Exam: General: Alert, angry, no physical distress Pulmonary: Bilateral lung sounds are clear to auscultation anteriorly.  She is stable on room air with O2 saturations 99%. Cardiac: Cardiac sounds are S1-S2 without rubs or murmurs.  Pulses regular and nontachycardic.  SBP greater than 90. Abdomen:, Bowel sounds present, abdomen nontender and nondistended. LBM 5/26.  Depending on mentation eating between 50 to 100% of meals over the past several days. Neurological: Moves all extremities x4, no focal neurological deficits, no tremors or rigidity/EPS sx's Psychiatric: Awake, angry and agitated but not restless.  Not requiring restraints.  Writing continuously and notebook at bedside.  Confused and oriented to name only.   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 Unfortunately her brain injury sequela (agitation, confusion and hallucinations) reemerged on lower dose of Seroquel therefore decision made to discontinue in favor of PO Geodon. Typically after receiving IM Geodon patient becomes calm with improved orientation, able to perform ADLs independently and respond to questions verbally/by written word  -Patient had been started on p.o. Geodon during this  admission-12/23 increased dose from 20 mg BID to 40 mg-QTC stable at 440 ms.  Given requirement of frequent Geodon IM administration for breakthrough agitation we will go ahead and increase Geodon to 60 mg today on 12/28 -Appreciate neurology assistance.  Repeat MRI on 12/28 within normal limits.  It is felt patient's current behavioral issues are likely a combination of brain injury sequela on top of underlying chronic psychiatric condition. -Beginning last week patient was complaining of extreme anxiety and Klonopin titrated up to 2 mg every 6 hours.  Clonidine also added by psychiatric team.  Benzodiazepines especially high dose can exacerbate anxiety and cause anger and certain patients therefore as of 12/28 will decrease Klonopin back to 1 mg every 6 hours and consider slow titration down as long as anxiety stable. -Continue Lexapro 20 mg at bedtime -On melatonin for insomnia (trazodone and Ambien seem to cause worsening of insomnia and agitation) -Vimpat and Dilantin have been tapered and discontinued.  12/27 phenobarbital taper initiated -Continue naltrexone for hyper sexualization (patient with history of PTSD and narcotic abuse prior to admission).  Because of requirement for this medication unable to treat  patient with methadone she has been intermittently requesting. -Continue Tegretol 400 mg tid behavioral disturbances, agitation and impulsivity (this will also function as an AED if needed)-if LFTs increase can transition to Depakote per recommendation of psychiatry.  On 12/28 patient refused to have labs obtained therefore will reorder today January 1 since last LFTs obtained on 12/25 and were stable -See below regarding treatment of anxiety with Vistaril and Klonopin D/w Pam/CIR- rec having pt fu with the [facility] if able to be dc'd home  Severe anxiety -As patient's brain injury symptoms are appropriately treated and she becomes more alert and able to communicate she is now  expressing issues regarding significant anxiety and panic attack symptoms requesting pharmacotherapy -Upon review of drug to drug interactions with Geodon Vistaril can cause increased psychomotor impairment well is increased risk for QTC prolongation therefore this medication was discontinued as of 12/24. -Given concerns that may be contributing to patient's anger and anxiety Klonopin decreased from 2 mg to 1 mg every 6 hours as of 12/28.  Continue Tegretol.  Acute on chronic depression prior to admission/known polysubstance abuse/suicidal ideation/PTSD -Attempted to maximize depression therapy but seem to make patient's mentation worse and did not improve symptoms therefore Zoloft 100 mg and Abilify 2 mg discontinued after being tapered -Seroquel contributed to depression and suicidal ideation but unfortunately with lower doses brain injury sequela reemerged therefore Seroquel discontinued in favor of Geodon as described above. -Reexamined by palliative medicine and psychiatry on 12/14.  Please see their notes for details.  Patient agreeable to outpatient psych follow-up well is consideration for outpatient ECT if indicated/candidate.  This has been discussed with patient's family i.e. her stepmother and father and if patient stable enough to discharge to home environment they have planned on pursuing this option.  -See above regarding adjustments in psychotropic medications, behavioral changes and requirement for safety sitter/restraints -12/27 neurology reconsulted and subsequent work-up including MRI reveals no neurological etiology to patient's behavioral changes which appear to be more related to psychiatric and underlying brain injury sequela      Other problems: Nonobstructive transaminitis in context of hepatitis C -Elevated HCV antibody with markedly elevated HCV RNA quantitative level  consistent with chronic hepatitis C  -LFTs remain stable without an obstructive pattern noted -11/10:  Discussed with ID Dr. Manson Passey.  Treatment of hepatitis C typically is initiated in the outpatient setting.  Medications can be quite expensive and usually take time to obtain insurance approval.   -ID recommends scheduling outpatient appointment their clinic closer to discharge date  Back pain 2/2 abnormal urinalysis consistent with UTI -Patient with low-grade fever overnight and is now having back pain for days not responsive to NSAIDs and muscle relaxers -DG lumbar spine unremarkable -During hospitalization patient has been treated for pansensitive E. coli UTI as well as Salmonella UTI both of which have been sensitive to Levaquin -Completed Levaquin 12/20-urine culture obtained after antibiotics initiated and showed no growth -CT abdomen and pelvis on 12/17 unremarkable  Hyperglycemia secondary to medication -Resolved -Hemoglobin A1c 5.8 consistent with prediabetes -Antipsychotics such as Seroquel and Zyprexa can cause hyperglycemia and drug-related diabetes mellitus we will continue to follow closely; patient has been having increased thirst and increased urination  ? Breast enlargement/gynecomastia -No clinical evidence of breast enlargement -Prolactin level low with normal FSH -Appearance of breast enlargement reported by family likely related to improve nutrition as well as abdominal binder moving up underneath breasts and malpositioning of breasts.  Diarrhea>>>Constipation Resolved Felt secondary to laxatives  but given Salmonella UTI could have been related to Salmonella although at the time diarrhea was being worked up patient had no leukocytosis, no abdominal pain and no fevers.  Suspected oral Candida -Resolved after oral nystatin  Low back pain -Resolved -Completed 5 days of ibuprofen -Continue Voltaren gel and add as needed Flexeril (will DC Robaxin since there is to be ineffective)  Salmonella UTI -Completed amoxicillin  Physical deconditioning -Resolved -Does  continue to exhibit impulsivity but maintains appropriate balance and gait no longer requires one-to-one safety sitter  Large hemorrhoids. -Resolved -Markedly improved-LD Proctofoam 12/15  Dysphagia -Resolved -Continue regular diet    Data Reviewed: Basic Metabolic Panel: Recent Labs  Lab 04/13/20 0142 04/16/20 1417  NA 139 140  K 3.9 3.9  CL 102 103  CO2 29 28  GLUCOSE 164* 137*  BUN 10 16  CREATININE 0.59 0.63  CALCIUM 8.8* 9.0   Liver Function Tests: Recent Labs  Lab 04/16/20 1417  AST 64*  ALT 53*  ALKPHOS 143*  BILITOT 0.2*  PROT 5.8*  ALBUMIN 3.1*   No results for input(s): LIPASE, AMYLASE in the last 168 hours. No results for input(s): AMMONIA in the last 168 hours. CBC: Recent Labs  Lab 04/16/20 1417  WBC 5.3  HGB 12.1  HCT 37.3  MCV 98.4  PLT 334   Cardiac Enzymes: No results for input(s): CKTOTAL, CKMB, CKMBINDEX, TROPONINI in the last 168 hours. BNP (last 3 results) No results for input(s): BNP in the last 8760 hours.  ProBNP (last 3 results) No results for input(s): PROBNP in the last 8760 hours.  CBG: No results for input(s): GLUCAP in the last 168 hours.  No results found for this or any previous visit (from the past 240 hour(s)).   Studies: MR BRAIN WO CONTRAST  Result Date: 04/19/2020 CLINICAL DATA:  Delirium. Additional history provided: 34 year old female who presented to [facility] 01/06/2020 within cephalopathy, EEG at that time showing status epilepticus arising from left central temporal region. EXAM: MRI HEAD WITHOUT CONTRAST TECHNIQUE: Multiplanar, multiecho pulse sequences of the brain and surrounding structures were obtained without intravenous contrast. COMPARISON:  Brain MRI 01/19/2020. FINDINGS: Brain: Mild intermittent motion degradation. Cerebral volume is normal. No focal parenchymal signal abnormality is identified. There is no acute infarct. No evidence of intracranial mass. No chronic intracranial blood  products. No extra-axial fluid collection. No midline shift. Vascular: Expected proximal arterial flow voids. Skull and upper cervical spine: No focal marrow lesion. Sinuses/Orbits: Visualized orbits show no acute finding. No significant paranasal sinus disease at the imaged levels. IMPRESSION: Mild intermittent motion degradation. Within this limitation, there is an unremarkable non-contrast MRI appearance of the brain. No evidence of acute intracranial abnormality. Electronically Signed   By: Jackey LogeKyle  Golden DO   On: 04/19/2020 08:50    Scheduled Meds: . carbamazepine  400 mg Oral TID  . Chlorhexidine Gluconate Cloth  6 each Topical Daily  . clonazePAM  1 mg Oral Q6H  . cloNIDine  0.1 mg Transdermal Weekly  . diclofenac Sodium  4 g Topical QID  . escitalopram  20 mg Oral QHS  . feeding supplement  237 mL Oral TID BM  . Gerhardt's butt cream  1 application Topical TID  . melatonin  5 mg Oral QHS  . multivitamin with minerals  1 tablet Oral Daily  . naltrexone  50 mg Oral Daily  . polyethylene glycol  17 g Oral Daily  . vitamin B-6  100 mg Oral Daily  . thiamine  100 mg Oral Daily  . ziprasidone  60 mg Oral 2 times per day   Continuous Infusions: . sodium chloride      Principal Problem:   Delirium due to multiple etiologies Active Problems:   Acute metabolic encephalopathy   Hypokalemia   Polysubstance abuse (HCC)   Elevated CK   Transaminitis   Refractory seizure (HCC)   Chronic hepatitis C without hepatic coma (HCC)   Distended abdomen   Palliative care encounter   Colonic Ileus (HCC)   Organic brain syndrome   On enteral nutrition   Physical deconditioning   Protein-calorie malnutrition (HCC)   Inadequate oral nutritional intake   Impulse disorder   Consultants:  Neurology  Psychiatry  Interventional radiology  Surgery  Procedures:  9/14 lumbar puncture  9/15 EEG  9/16 EEG  9/17 EEG  9/19 overnight EEG with video  9/22 overnight EEG with video with  discontinuation of long-term EEG monitoring on 9/25  9/24 core track placement  10/6 EEG  12/15 PEG tube removed  Antibiotics: Anti-infectives (From admission, onward)   Start     Dose/Rate Route Frequency Ordered Stop   04/05/20 1415  levofloxacin (LEVAQUIN) tablet 500 mg        500 mg Oral Daily 04/05/20 1327 04/11/20 0911   03/19/20 1000  metroNIDAZOLE (FLAGYL) tablet 500 mg  Status:  Discontinued        500 mg Per Tube 2 times daily 03/19/20 0822 03/23/20 0827   03/18/20 1200  amoxicillin (AMOXIL) 250 MG/5ML suspension 500 mg  Status:  Discontinued        500 mg Per Tube Every 8 hours 03/18/20 0915 03/23/20 0559   03/17/20 1200  cefTRIAXone (ROCEPHIN) 1 g in sodium chloride 0.9 % 100 mL IVPB  Status:  Discontinued        1 g 200 mL/hr over 30 Minutes Intravenous Every 24 hours 03/17/20 1048 03/18/20 0913   03/16/20 1130  metroNIDAZOLE (FLAGYL) 50 mg/ml oral suspension 500 mg  Status:  Discontinued        500 mg Per Tube 2 times daily 03/16/20 1040 03/19/20 0821   03/16/20 1130  fluconazole (DIFLUCAN) 40 MG/ML suspension 152 mg        150 mg Per Tube  Once 03/16/20 1040 03/16/20 1245   02/11/20 1526  ceFAZolin (ANCEF) IVPB 2g/100 mL premix        2 g 200 mL/hr over 30 Minutes Intravenous  Once 02/11/20 1526 02/11/20 1641   02/05/20 0600  ceFAZolin (ANCEF) IVPB 2g/100 mL premix        2 g 200 mL/hr over 30 Minutes Intravenous To Short Stay 02/04/20 1054 02/05/20 0950   01/29/20 0600  ceFAZolin (ANCEF) IVPB 2g/100 mL premix        2 g 200 mL/hr over 30 Minutes Intravenous On call to O.R. 01/28/20 1511 01/30/20 0559   01/28/20 2000  cefTRIAXone (ROCEPHIN) 1 g in sodium chloride 0.9 % 100 mL IVPB        1 g 200 mL/hr over 30 Minutes Intravenous Every 24 hours 01/28/20 1925 02/02/20 0724   01/06/20 0900  cefTRIAXone (ROCEPHIN) 2 g in sodium chloride 0.9 % 100 mL IVPB  Status:  Discontinued        2 g 200 mL/hr over 30 Minutes Intravenous Every 12 hours 01/06/20 0105 01/06/20 2202    01/06/20 0800  vancomycin (VANCOCIN) IVPB 1000 mg/200 mL premix  Status:  Discontinued       "Followed by" Linked Group  Details   1,000 mg 200 mL/hr over 60 Minutes Intravenous Every 12 hours 01/05/20 1904 01/06/20 2202   01/05/20 2000  vancomycin (VANCOREADY) IVPB 1500 mg/300 mL       "Followed by" Linked Group Details   1,500 mg 150 mL/hr over 120 Minutes Intravenous  Once 01/05/20 1904 01/06/20 0127   01/05/20 1830  cefTRIAXone (ROCEPHIN) 2 g in sodium chloride 0.9 % 100 mL IVPB        2 g 200 mL/hr over 30 Minutes Intravenous  Once 01/05/20 1829 01/05/20 2220       Time spent: 30 minutes    [name] ANP  [facility] 7 am- 330 pm/M-F 04/19/2020, 12:18 PM  LOS: 104 days

## 2020-04-19 NOTE — Progress Notes (Signed)
At 1330 Pt requested med for anxiety, asking sitter to "hook me up". Pt teary eyed. Pt's step mother came in to visit, right after she was requesting anxiety medication.  This distracted pt for a while. Pt's sched klonopin not due at 1330, due at 1500.  Only prn is geodon for severe anxiety.   Able to communicate with [name], NP and order taken for vistaril IM.   Pharm had to verify this med, med not stocked in unit pyxis, pharm called to send med, tube station down.  At this time pt can get her sched klonopin.  Pt given her sched klonopin at this time for her anxiety.

## 2020-04-19 NOTE — Progress Notes (Signed)
Subjective: No acute events overnight.  ROS: negative except above  Examination  Vital signs in last 24 hours: Temp:  [98.4 F (36.9 C)-98.5 F (36.9 C)] 98.4 F (36.9 C) (12/28 0558) Pulse Rate:  [72-88] 72 (12/28 0558) Resp:  [20] 20 (12/28 0558) BP: (104-109)/(70) 104/70 (12/28 0558) SpO2:  [99 %-100 %] 99 % (12/28 0558)  General: lying in bed, not in apparent distress CVS: pulse-normal rate and rhythm RS: breathing comfortably Extremities: normal   Neuro: Awake, alert, did not tell me her name but did follow commands, able to name objects, spontaneously moving all extremities in bed.  Basic Metabolic Panel: Recent Labs  Lab 04/13/20 0142 04/16/20 1417  NA 139 140  K 3.9 3.9  CL 102 103  CO2 29 28  GLUCOSE 164* 137*  BUN 10 16  CREATININE 0.59 0.63  CALCIUM 8.8* 9.0    CBC: Recent Labs  Lab 04/16/20 1417  WBC 5.3  HGB 12.1  HCT 37.3  MCV 98.4  PLT 334     Coagulation Studies: No results for input(s): LABPROT, INR in the last 72 hours.  Imaging MRI brain without contrast 04/19/2020: Mild intermittent motion degradation.  Unremarkable noncontrast MRI appearance of the brain without any acute intracranial abnormalities.  ASSESSMENT AND PLAN: 34 year old femalewho initially presented to [facility] on 01/06/2020 with encephalopathy. EEG at that time showed status epilepticus arising from left central temporal region which has since resolved. 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 Continues to have waxing and waning mental status even after adjustment in psych meds.  Encephalopathy - waxing and waning mental state, ddx include primary psych disorder with possibly some component of neurologic sequela.  Recommendations -At this point with normal MRI brain, it is highly unlikely that patient's cardiovascular any mental status is related to an ongoing neurologic issue. - Serum autoimmune and paraneoplastic panel (  send out to [facility]).  We will follow.  - continue seizure precautions - management of rest of comorbidities per primary team  Thank you for allowing [place] to participate in the care of this patient.  Neurology will sign off.  Please call if you have any further questions.  I have spent a total of 30  minuteswith the patient reviewing hospitalnotes,  test results, labs and examining the patient as well as establishing an assessment and plan.>50% of time was spent in direct patient care.    Lindie Spruce Epilepsy [facility] For questions after 5pm please refer to AMION to reach the Neurologist on call

## 2020-04-20 LAB — URINALYSIS, ROUTINE W REFLEX MICROSCOPIC
Bacteria, UA: NONE SEEN
Bilirubin Urine: NEGATIVE
Glucose, UA: NEGATIVE mg/dL
Ketones, ur: NEGATIVE mg/dL
Leukocytes,Ua: NEGATIVE
Nitrite: NEGATIVE
Protein, ur: NEGATIVE mg/dL
RBC / HPF: >50 RBC/hpf — ABNORMAL HIGH (ref 0–5)
Specific Gravity, Urine: 1.023 (ref 1.005–1.030)
pH: 5 (ref 5.0–8.0)

## 2020-04-20 NOTE — Plan of Care (Signed)
Patient sleeping most of the am before lunch. Patient ambulating in the hall with supervision. Sitter at bedside for safety. Patient cooperative to take medications and tolerating meals. Patient not speaking to this Scientist, clinical (histocompatibility and immunogenetics) during the shift. Patient listening to music for relaxation purpose. Patient up in her room through out the afternoon.   Problem: Education: Goal: Knowledge of General Education information will improve Description: Including pain rating scale, medication(s)/side effects and non-pharmacologic comfort measures Outcome: Progressing   Problem: Clinical Measurements: Goal: Ability to maintain clinical measurements within normal limits will improve Outcome: Progressing Goal: Will remain free from infection Outcome: Progressing Goal: Diagnostic test results will improve Outcome: Progressing Goal: Respiratory complications will improve Outcome: Progressing Goal: Cardiovascular complication will be avoided Outcome: Progressing   Problem: Activity: Goal: Risk for activity intolerance will decrease Outcome: Progressing   Problem: Nutrition: Goal: Adequate nutrition will be maintained Outcome: Progressing   Problem: Coping: Goal: Level of anxiety will decrease Outcome: Progressing   Problem: Elimination: Goal: Will not experience complications related to bowel motility Outcome: Progressing Goal: Will not experience complications related to urinary retention Outcome: Progressing   Problem: Pain Managment: Goal: General experience of comfort will improve Outcome: Progressing   Problem: Safety: Goal: Ability to remain free from injury will improve Outcome: Progressing   Problem: Skin Integrity: Goal: Risk for impaired skin integrity will decrease Outcome: Progressing   Problem: Education: Goal: Expressions of having a comfortable level of knowledge regarding the disease process will increase Outcome: Progressing   Problem: Coping: Goal: Ability to  adjust to condition or change in health will improve Outcome: Progressing Goal: Ability to identify appropriate support needs will improve Outcome: Progressing   Problem: Health Behavior/Discharge Planning: Goal: Compliance with prescribed medication regimen will improve Outcome: Progressing   Problem: Medication: Goal: Risk for medication side effects will decrease Outcome: Progressing   Problem: Clinical Measurements: Goal: Complications related to the disease process, condition or treatment will be avoided or minimized Outcome: Progressing Goal: Diagnostic test results will improve Outcome: Progressing   Problem: Safety: Goal: Verbalization of understanding the information provided will improve Outcome: Progressing   Problem: Self-Concept: Goal: Level of anxiety will decrease Outcome: Progressing Goal: Ability to verbalize feelings about condition will improve Outcome: Progressing   Problem: Education: Goal: Ability to state activities that reduce stress will improve Outcome: Progressing   Problem: Coping: Goal: Ability to identify and develop effective coping behavior will improve Outcome: Progressing   Problem: Self-Concept: Goal: Ability to identify factors that promote anxiety will improve Outcome: Progressing Goal: Level of anxiety will decrease Outcome: Progressing Goal: Ability to modify response to factors that promote anxiety will improve Outcome: Progressing   Problem: Education: Goal: Utilization of techniques to improve thought processes will improve Outcome: Progressing Goal: Knowledge of the prescribed therapeutic regimen will improve Outcome: Progressing   Problem: Activity: Goal: Interest or engagement in leisure activities will improve Outcome: Progressing Goal: Imbalance in normal sleep/wake cycle will improve Outcome: Progressing   Problem: Coping: Goal: Coping ability will improve Outcome: Progressing Goal: Will verbalize  feelings Outcome: Progressing   Problem: Health Behavior/Discharge Planning: Goal: Ability to make decisions will improve Outcome: Progressing Goal: Compliance with therapeutic regimen will improve Outcome: Progressing   Problem: Role Relationship: Goal: Will demonstrate positive changes in social behaviors and relationships Outcome: Progressing   Problem: Safety: Goal: Ability to disclose and discuss suicidal ideas will improve Outcome: Progressing Goal: Ability to identify and utilize support systems that promote safety will improve Outcome: Progressing  Problem: Self-Concept: Goal: Will verbalize positive feelings about self Outcome: Progressing Goal: Level of anxiety will decrease Outcome: Progressing   Problem: Education: Goal: Verbalization of understanding the information provided will improve Outcome: Progressing   Problem: Coping: Goal: Ability to demonstrate appropriate behavior when angry will improve Outcome: Progressing Goal: Ability to identify and develop effective coping behavior will improve Outcome: Progressing Goal: Ability to interact with others will improve Outcome: Progressing   Problem: Health Behavior/Discharge Planning: Goal: Identification of resources available to assist in meeting health care needs will improve Outcome: Progressing

## 2020-04-20 NOTE — Progress Notes (Signed)
Pt slept good last night. Requesting if home med Methadone can be resume here in the hospital.

## 2020-04-20 NOTE — Progress Notes (Signed)
Patient restless walking the hall and attempting to go into other patients rooms. Patient refusing to leave the nurses station demanding methadone. Patient hollering that she wants her drugs from her boyfriend who was arrested. Patient redirected to her room and Geodon administered for extreme restlessness and agitation. Patient sitting on her bed talking about using drugs and wanting her methadone.

## 2020-04-20 NOTE — Progress Notes (Addendum)
[facility] PROGRESS NOTE  Anita Davis FXT:[id number] DOB: 29-Feb-1986 DOA: 01/04/2020 PCP: [name], PA-C              03/30/20                         04/08/20    Status: Remains inpatient appropriate because:Altered mental status and Unsafe d/c plan  Dispo:  The patient is from: Home              Anticipated d/c is to: SNF vs home with family.  Extensive conversation had with patient's stepmother [name].  She has been in contact with a local nursing facility in [facility] that apparently accept patients with neuro psychiatric issues.  It is best for patient if we are able to locate a facility that is close to family so they can visit her daily              Anticipated d/c date is: > 3 days              Patient currently is medically stable to d/c.  Barrier to discharge:SNF placement at a facility that can accommodate behavioral issues similar to dementia patients, Medicaid and disability applications in process.[facility] reviewing chart (family's preference since is close to them and would allow daily visits)   Code Status: Full Family Communication: Stepmother [name] 12/28 DVT prophylaxis: 12/15 consistently ambulatory therefore will discontinue Lovenox Vaccination status: Family confirmed initial Pfizer Covid vaccine dose given in June, second dose given on 11/2  Pertinent social history Patient has 3 children: [name], 85 and [name] [name]'s father has primary custody and no one in the family knows where they reside With permission from patient's father and stepmother I contacted [name] (telephone number [phone]) who has legal custody of [name] and [name]; at this juncture [name] states that after extensive discussions with [name] he no longer wishes to visit his mother in person.  [name] is only 7 and it is not appropriate for him to visit his mother at this point.  Prior to the patient's acute illness [name] states that [name]  had regular frequent supervised visits with these children.  The possibility of children recording video greetings to their mother has been discussed and she will need to discuss this with her husband before agreeing. [name] is agreeable to keeping a steady line of communication open with the patient's stepmother [name] After multiple conversations with other family members patient's aunt [name] is no longer allowed on the visitation list and is not to be given any information patient status  Foley catheter: No  HPI: 34 year old female with known history of substance abuse on prior methadone, active hepatitis C and gestational diabetes who presented initially with confusion.  She was initially admitted to [facility] in September with a diagnosis of encephalopathy presumed secondary to drug overdose.  Subsequent work-up inconclusive.  LP without signs of infection, MRI brain unremarkable and unfortunately she remained encephalopathic.  EEG did reveal focal seizures.  AED therapy was initiated and seizures were suppressed and she was transferred to [facility] for continuous EEG.  Apparently had episodes of status epilelepticus before full suppression of seizures with medications.  Subsequently neurological recovery was poor and delayed.  Neurology suspicious for autoimmune encephalitis and was given high-dose steroids with equivocal improvement but deteriorated after steroids completed.  Was given IVIG without any significant improvement in mentation.  For the past  several months she has had waxing and waning mentation ranging from unresponsive to alert confused and verbal and at times restless and agitated.  When she is awake/alert she is able to ambulate but requires assistance due to impulsivity and gait instability.  Unfortunately these episodes of wakefulness were never sustained.  During the last week of November the decision was made to taper and discontinue all psychotropic  medications due to lack of response and altered mentation.  Once this was accomplished patient became very alert and agitated but remained confused. Over time with adjustment of psychotropic medications patient remained awake, was able to feed herself and ambulate with minimal assistance.  She has demonstrated issues with impulsivity, anxiety and disinhibition including hypersexuality.  Currently symptoms are managed with Seroquel, Depakote, naltrexone, and Vistaril.  Remains inconsistent with verbal communication noting at times she is almost mute with severe flat affect versus being extremely appropriate and as her family describes "back to being [place] again".  Patient is able to write out her thoughts and feelings in a notebook.  Most of the writings are incoherent at times and her sentence structure appears more consistent with gibberish as opposed to true communication.  Her best responses are simple 1-2 word responses/ phrases.  She appears to be extremely depressed. She perseverates on not having custody of her children and is confused believing that this is an acute event that is occurring now as opposed to something that had occurred several years prior to current admission. She has written phrases such as "I am a dumbass" and "please forgive me God" but does not have enough insight to elaborate when further questioned.  The palliative care and psychiatric teams concurrently evaluated the patient on 12/14 continue to encourage her to write down her thoughts and feelings.    In regards to her admission history of status epilepticus in the context of no prior history of epilepsy, Dr. Melynda Ripple with neurology was contacted regarding the possibility of weaning and discontinuing antiepileptic medications. She has had no further seizures for greater than 2 months. Neurology recommendation is to begin slowly tapering and discontinuing antiepileptic drugs monitoring for any reemergence of seizure activity.   PEG  tube has been discontinued.  Patient is eating consistently and taking meds by mouth without difficulty.  Although her mentation has not returned to her preadmission baseline there is no indication at this juncture to continue safety sitter even with her underlying impulsivity.  She is able to ambulate without difficulty and maintained steady appropriate gait.  She is not a fall risk at this juncture.  She was not requiring safety restraints.  Starting on 12/24 in the evening patient became extremely agitated and required multiple security officers to restrain her.  She was alternating between being paranoid and fearful that she was being unlawfully incarcerated and called 911 repeatedly until finally removed from room vs being extremely belligerent trying attempting to leave room, using foul language, using devices including phone cord in room to attempt to strangle self.  Behaviors do improve with intermittent administration of IM Geodon on top of scheduled Geodon but have been reemerging thus requiring one-to-one sitter and continued safety restraints.  She was emergently evaluated by psychiatry with recommendation to begin clonidine to help with agitation otherwise continue medications as ordered.  They did recommend transitioning from Tegretol to Depakote if LFTs trend upward.  Also over the weekend attending discussed with rehab physician extender who recommended increasing frequency of current Tegretol.  Subjective: No further episodes of  severe agitation or acting out behaviors in the past 24 hours.  Nursing staff documented that the patient slept very well overnight.  Patient has asked nursing staff as well as myself to restart preadmission methadone.  Discussed with patient that cannot give this medication with naltrexone which is being utilized to treat other conditions and works on the same receptors as methadone and can reduce the effectiveness of methadone.  Unfortunately given patient's altered  mentation in regards to confusion it is doubtful she understood this information.   Objective: Vitals:   04/19/20 1945 04/20/20 0505  BP: 114/81 122/81  Pulse: 80 81  Resp: 18 18  Temp: 98.4 F (36.9 C) 98.2 F (36.8 C)  SpO2: 99% 97%    Intake/Output Summary (Last 24 hours) at 04/20/2020 1259 Last data filed at 04/20/2020 0505 Gross per 24 hour  Intake 260 ml  Output 800 ml  Net -540 ml   Filed Weights   03/29/20 0500 04/04/20 0632 04/05/20 0613  Weight: 72.5 kg 70.9 kg 71.6 kg    Exam: General: Awake, very tearful and crying at times.  Listening to faith-based music on YouTube Pulmonary: Stable on room air with pulse oximetry 97%.  Bilateral lung sounds clear to auscultation anteriorly Cardiac: Heart sounds S1-S2 without rubs murmurs thrills or gallops.  No JVD or peripheral edema. Abdomen:, Abdomen soft nontender nondistended.  Bowel sounds present and normoactive.  Eating better over the past 48 hours LBM/27 Neurological: Moves all extremities x4, no focal neurological deficits, no tremors or rigidity/EPS sx's Psychiatric: Awake and crying at times.  Previous anger and agitation have resolved.  Patient stated that she is in "the [place]  Patient professes to be of [employer] belief system therefore attempted to encourage patient regarding her salvation but she is very confused and has difficulty processing this type of conceptual information.  Currently she is oriented to self and possibly to place but not to year or situation.   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 Unfortunately her brain injury sequela (agitation, confusion and hallucinations) reemerged on lower dose of Seroquel  therefore decision made to discontinue in favor of PO Geodon. Typically after receiving IM Geodon patient becomes calm with improved orientation, able to perform ADLs independently and respond to questions verbally/by written word  -Geodon increased to to 60 mg on 12/28-repeat EKG to follow QTC -Appreciate neurology assistance.  Repeat MRI on 12/28 within normal limits.  It is felt patient's current behavioral issues are likely a combination of brain injury sequela on top of underlying chronic psychiatric condition. -Beginning last week patient was complaining of extreme anxiety and Klonopin titrated up to 2 mg every 6 hours.  Clonidine also added by psychiatric team.  Benzodiazepines especially high dose can exacerbate anxiety and cause anger in certain patients therefore as of 12/28 decreased Klonopin back to 1 mg every 6 hours -if she remains stable can consider decreasing further to 0.5 mg on 04/23/2020 -Continue Lexapro 20 mg at bedtime -On melatonin for insomnia (trazodone and Ambien seem to cause worsening of insomnia and agitation) -Vimpat, Dilantin and phenobarbital have been tapered and discontinued at recommendation of Neurologist -Continue naltrexone for hyper sexualization (patient with history of PTSD and narcotic abuse prior to admission).  Hopeful that Geodon may potentially control some of these  behaviors once therapeutic.  If patient continues to request preadmission methadone can consider tapering and discontinuing naltrexone favor of preadmission methadone-she had been on 60 mg/day and if resumed would need to start at a lower dose.  Again uncertain if other impulsive behaviors including hypersexuality can be effectively managed with methadone alone and cannot give methadone with naltrexone due to decreased effectiveness. -Continue Tegretol 400 mg tid behavioral disturbances-if LFTs increase can transition to Depakote per recommendation of psychiatry. -See below regarding treatment of  anxiety with Vistaril and Klonopin D/w Pam/CIR- rec [facility] if able to be dc'd home  Severe anxiety -As early last week (the week of Christmas) she was much more alert and able to communicate symptoms of significant anxiety and impending panic attack symptoms and was requesting medications such as Klonopin added and uptitrated to high dose. -Upon review of drug to drug interactions with Geodon, Vistaril can cause increased psychomotor impairment and can increase risk for QTC prolongation therefore this medication was discontinued as of 12/24. -Given concerns that may be contributing to patient's anger and anxiety Klonopin decreased from 2 mg to 1 mg every 6 hours 12/28.  Continue Tegretol.  Acute on chronic depression prior to admission/known polysubstance abuse/suicidal ideation/PTSD -Attempted to maximize depression therapy but seem to make patient's mentation worse and did not improve symptoms therefore Zoloft 100 mg and Abilify 2 mg discontinued after being tapered -Seroquel contributed to depression and suicidal ideation but unfortunately with lower doses brain injury sequela reemerged therefore Seroquel discontinued in favor of Geodon as described above. -Reexamined by palliative medicine and psychiatry on 12/14.  -See above regarding adjustments in psychotropic medications, behavioral changes and requirement for safety sitter/restraints -12/27 neurology reconsulted and subsequent work-up including MRI reveals no neurological etiology to patient's behavioral changes which appear to be more related to psychiatric and underlying brain injury sequela  Abnormal urinalysis -Recent behavioral abnormalities decision made to check UA to rule out UTI. -More consistent with dehydration noting hazy appearance, amber color, moderate hemoglobin with greater than 50 RBCs and borderline elevated specific gravity of 1.023 -Encourage oral intake     Other  problems: Nonobstructive transaminitis in context of hepatitis C -Elevated HCV antibody with markedly elevated HCV RNA quantitative level  consistent with chronic hepatitis C  -LFTs remain stable without an obstructive pattern noted -11/10: Discussed with ID Dr. Manson PasseyMandahar.  Treatment of hepatitis C typically is initiated in the outpatient setting.  Medications can be quite expensive and usually take time to obtain insurance approval.   -ID recommends scheduling outpatient appointment their clinic closer to discharge date  Back pain 2/2 abnormal urinalysis consistent with UTI -Patient with low-grade fever overnight and is now having back pain for days not responsive to NSAIDs and muscle relaxers -DG lumbar spine unremarkable -During hospitalization patient has been treated for pansensitive E. coli UTI as well as Salmonella UTI both of which have been sensitive to Levaquin -Completed Levaquin 12/20-urine culture obtained after antibiotics initiated and showed no growth -CT abdomen and pelvis on 12/17 unremarkable  Hyperglycemia secondary to medication -Resolved -Hemoglobin A1c 5.8 consistent with prediabetes -Antipsychotics such as Seroquel and Zyprexa can cause hyperglycemia and drug-related diabetes mellitus we will continue to follow closely; patient has been having increased thirst and increased urination  ? Breast enlargement/gynecomastia -No clinical evidence of breast enlargement -Prolactin level low with normal FSH -Appearance of breast enlargement reported by family likely related to improve nutrition as well as abdominal binder moving up  underneath breasts and malpositioning of breasts.  Diarrhea>>>Constipation Resolved Felt secondary to laxatives but given Salmonella UTI could have been related to Salmonella although at the time diarrhea was being worked up patient had no leukocytosis, no abdominal pain and no fevers.  Suspected oral Candida -Resolved after oral  nystatin  Low back pain -Resolved -Completed 5 days of ibuprofen -Continue Voltaren gel and add as needed Flexeril (will DC Robaxin since there is to be ineffective)  Salmonella UTI -Completed amoxicillin  Physical deconditioning -Resolved -Does continue to exhibit impulsivity but maintains appropriate balance and gait no longer requires one-to-one safety sitter  Large hemorrhoids. -Resolved -Markedly improved-LD Proctofoam 12/15  Dysphagia -Resolved -Continue regular diet    Data Reviewed: Basic Metabolic Panel: Recent Labs  Lab 04/16/20 1417  NA 140  K 3.9  CL 103  CO2 28  GLUCOSE 137*  BUN 16  CREATININE 0.63  CALCIUM 9.0   Liver Function Tests: Recent Labs  Lab 04/16/20 1417  AST 64*  ALT 53*  ALKPHOS 143*  BILITOT 0.2*  PROT 5.8*  ALBUMIN 3.1*   No results for input(s): LIPASE, AMYLASE in the last 168 hours. No results for input(s): AMMONIA in the last 168 hours. CBC: Recent Labs  Lab 04/16/20 1417  WBC 5.3  HGB 12.1  HCT 37.3  MCV 98.4  PLT 334   Cardiac Enzymes: No results for input(s): CKTOTAL, CKMB, CKMBINDEX, TROPONINI in the last 168 hours. BNP (last 3 results) No results for input(s): BNP in the last 8760 hours.  ProBNP (last 3 results) No results for input(s): PROBNP in the last 8760 hours.  CBG: No results for input(s): GLUCAP in the last 168 hours.  No results found for this or any previous visit (from the past 240 hour(s)).   Studies: MR BRAIN WO CONTRAST  Result Date: 04/19/2020 CLINICAL DATA:  Delirium. Additional history provided: 33 year old female who presented to [facility] 01/06/2020 within cephalopathy, EEG at that time showing status epilepticus arising from left central temporal region. EXAM: MRI HEAD WITHOUT CONTRAST TECHNIQUE: Multiplanar, multiecho pulse sequences of the brain and surrounding structures were obtained without intravenous contrast. COMPARISON:  Brain MRI 01/19/2020. FINDINGS: Brain: Mild  intermittent motion degradation. Cerebral volume is normal. No focal parenchymal signal abnormality is identified. There is no acute infarct. No evidence of intracranial mass. No chronic intracranial blood products. No extra-axial fluid collection. No midline shift. Vascular: Expected proximal arterial flow voids. Skull and upper cervical spine: No focal marrow lesion. Sinuses/Orbits: Visualized orbits show no acute finding. No significant paranasal sinus disease at the imaged levels. IMPRESSION: Mild intermittent motion degradation. Within this limitation, there is an unremarkable non-contrast MRI appearance of the brain. No evidence of acute intracranial abnormality. Electronically Signed   By: Jackey Loge DO   On: 04/19/2020 08:50    Scheduled Meds: . carbamazepine  400 mg Oral TID  . Chlorhexidine Gluconate Cloth  6 each Topical Daily  . clonazePAM  1 mg Oral Q6H  . cloNIDine  0.1 mg Transdermal Weekly  . diclofenac Sodium  4 g Topical QID  . escitalopram  20 mg Oral QHS  . feeding supplement  237 mL Oral TID BM  . Gerhardt's butt cream  1 application Topical TID  . hydrOXYzine  50 mg Intramuscular Once  . melatonin  5 mg Oral QHS  . multivitamin with minerals  1 tablet Oral Daily  . naltrexone  50 mg Oral Daily  . polyethylene glycol  17 g Oral Daily  .  vitamin B-6  100 mg Oral Daily  . thiamine  100 mg Oral Daily  . ziprasidone  60 mg Oral 2 times per day   Continuous Infusions: . sodium chloride      Principal Problem:   Delirium due to multiple etiologies Active Problems:   Acute metabolic encephalopathy   Hypokalemia   Polysubstance abuse (HCC)   Elevated CK   Transaminitis   Refractory seizure (HCC)   Chronic hepatitis C without hepatic coma (HCC)   Distended abdomen   Palliative care encounter   Colonic Ileus (HCC)   Organic brain syndrome   On enteral nutrition   Physical deconditioning   Protein-calorie malnutrition (HCC)   Inadequate oral nutritional intake    Impulse disorder   Consultants:  Neurology  Psychiatry  Interventional radiology  Surgery  Procedures:  9/14 lumbar puncture  9/15 EEG  9/16 EEG  9/17 EEG  9/19 overnight EEG with video  9/22 overnight EEG with video with discontinuation of long-term EEG monitoring on 9/25  9/24 core track placement  10/6 EEG  12/15 PEG tube removed  Antibiotics: Anti-infectives (From admission, onward)   Start     Dose/Rate Route Frequency Ordered Stop   04/05/20 1415  levofloxacin (LEVAQUIN) tablet 500 mg        500 mg Oral Daily 04/05/20 1327 04/11/20 0911   03/19/20 1000  metroNIDAZOLE (FLAGYL) tablet 500 mg  Status:  Discontinued        500 mg Per Tube 2 times daily 03/19/20 0822 03/23/20 0827   03/18/20 1200  amoxicillin (AMOXIL) 250 MG/5ML suspension 500 mg  Status:  Discontinued        500 mg Per Tube Every 8 hours 03/18/20 0915 03/23/20 0559   03/17/20 1200  cefTRIAXone (ROCEPHIN) 1 g in sodium chloride 0.9 % 100 mL IVPB  Status:  Discontinued        1 g 200 mL/hr over 30 Minutes Intravenous Every 24 hours 03/17/20 1048 03/18/20 0913   03/16/20 1130  metroNIDAZOLE (FLAGYL) 50 mg/ml oral suspension 500 mg  Status:  Discontinued        500 mg Per Tube 2 times daily 03/16/20 1040 03/19/20 0821   03/16/20 1130  fluconazole (DIFLUCAN) 40 MG/ML suspension 152 mg        150 mg Per Tube  Once 03/16/20 1040 03/16/20 1245   02/11/20 1526  ceFAZolin (ANCEF) IVPB 2g/100 mL premix        2 g 200 mL/hr over 30 Minutes Intravenous  Once 02/11/20 1526 02/11/20 1641   02/05/20 0600  ceFAZolin (ANCEF) IVPB 2g/100 mL premix        2 g 200 mL/hr over 30 Minutes Intravenous To Short Stay 02/04/20 1054 02/05/20 0950   01/29/20 0600  ceFAZolin (ANCEF) IVPB 2g/100 mL premix        2 g 200 mL/hr over 30 Minutes Intravenous On call to O.R. 01/28/20 1511 01/30/20 0559   01/28/20 2000  cefTRIAXone (ROCEPHIN) 1 g in sodium chloride 0.9 % 100 mL IVPB        1 g 200 mL/hr over 30 Minutes  Intravenous Every 24 hours 01/28/20 1925 02/02/20 0724   01/06/20 0900  cefTRIAXone (ROCEPHIN) 2 g in sodium chloride 0.9 % 100 mL IVPB  Status:  Discontinued        2 g 200 mL/hr over 30 Minutes Intravenous Every 12 hours 01/06/20 0105 01/06/20 2202   01/06/20 0800  vancomycin (VANCOCIN) IVPB 1000 mg/200 mL premix  Status:  Discontinued       "Followed by" Linked Group Details   1,000 mg 200 mL/hr over 60 Minutes Intravenous Every 12 hours 01/05/20 1904 01/06/20 2202   01/05/20 2000  vancomycin (VANCOREADY) IVPB 1500 mg/300 mL       "Followed by" Linked Group Details   1,500 mg 150 mL/hr over 120 Minutes Intravenous  Once 01/05/20 1904 01/06/20 0127   01/05/20 1830  cefTRIAXone (ROCEPHIN) 2 g in sodium chloride 0.9 % 100 mL IVPB        2 g 200 mL/hr over 30 Minutes Intravenous  Once 01/05/20 1829 01/05/20 2220       Time spent: 20 minutes    [name] ANP  [facility] 7 am- 330 pm/M-F 04/20/2020, 12:59 PM  LOS: 105 days

## 2020-04-21 LAB — COMPREHENSIVE METABOLIC PANEL
ALT: 65 U/L — ABNORMAL HIGH (ref 0–44)
AST: 59 U/L — ABNORMAL HIGH (ref 15–41)
Albumin: 3.1 g/dL — ABNORMAL LOW (ref 3.5–5.0)
Alkaline Phosphatase: 143 U/L — ABNORMAL HIGH (ref 38–126)
Anion gap: 9 (ref 5–15)
BUN: 16 mg/dL (ref 6–20)
CO2: 27 mmol/L (ref 22–32)
Calcium: 8.9 mg/dL (ref 8.9–10.3)
Chloride: 101 mmol/L (ref 98–111)
Creatinine, Ser: 0.58 mg/dL (ref 0.44–1.00)
GFR, Estimated: >60 mL/min (ref >60)
Glucose, Bld: 83 mg/dL (ref 70–99)
Potassium: 4.3 mmol/L (ref 3.5–5.1)
Sodium: 137 mmol/L (ref 135–145)
Total Bilirubin: 0.2 mg/dL — ABNORMAL LOW (ref 0.3–1.2)
Total Protein: 6 g/dL — ABNORMAL LOW (ref 6.5–8.1)

## 2020-04-21 MED ORDER — STERILE WATER FOR INJECTION IJ SOLN
INTRAMUSCULAR | Status: AC
  Filled 2020-04-21: qty 10

## 2020-04-21 NOTE — Progress Notes (Addendum)
[facility] PROGRESS NOTE  Anita Davis GGY:[id number] DOB: April 17, 1986 DOA: 01/04/2020 PCP: [name], PA-C    02/10/20                      02/15/20                       04/08/20   Status: Remains inpatient appropriate because:Altered mental status, Unsafe d/c plan and Inpatient level of care appropriate due to severity of illness   Dispo: The patient is from: Home              Anticipated d/c is to: SNF              Anticipated d/c date is: > 3 days              Patient currently is medically stable to d/c.  Barriers to discharge: Recent worsening of underlying psychiatric condition requiring short-term IVC in restraints.  IVC removed as of 12/30 and patient has not required restraints in 72 hours.  Also likely needs SNF due to behavioral issues.  It is hopeful at some point patient behavioral stabilize to where family can manage her at home    Code Status: DNR Family Communication: Stepmother [name] 12/29 DVT prophylaxis: Consistently ambulatory so no DVT prophylaxis indicated Vaccination status: Fully vaccinated against Covid with second vaccine given on 02/23/2020  Pertinent social history Patient has 3 children: [name] and [name]'s father has primary custody and no one in the family knows where they reside With permission from patient's father and stepmother I contacted [name] (telephone number [phone]) who has legal custody of [name] and [name]; at this juncture [name] states that after extensive discussions with [name] he no longer wishes to visit his mother in person.  [name] is only 7 and it is not appropriate for him to visit his mother at this point.  Prior to the patient's acute illness [name] states that [name] had regular frequent supervised visits with these children.  The possibility of children recording video greetings to their mother has been discussed and she will need to discuss this with her husband before agreeing. Mrs.  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  She presented to [facility] ER 3 times prior to admission.  On her 4th presentation she was admitted.  On 9/2 she was diagnosed with acute cystitis with hematuria and was discharged home on Keflex and Zofran.  9/9 she presented with anxiety and symptoms consistent with a panic attack.  She was discharged from the ER with prescription for Vistaril.  She again presented to the ER on 9/10  reporting significant anxiety and severe insomnia and stating the Vistaril was not working.  She was given Ativan while in the ER and this appeared to improve her symptoms.  She was stable for discharge and instructed to take Benadryl at bedtime to assist with her insomnia.  On 9/13 patient brought to the ER by family.  She  was refusing to answer questions.  Clinician documented sores/lesions on her legs consistent with methamphetamine use.  When questioned regarding this patient reportedly just stared at the clinician.  It was later determined that the patient had been abusing substances.  Prior to discharge patient appeared to be hallucinating but was able to verbally communicate.  Over the next hour she became more agitated and was given Ativan 2 mg orally. She returned to her baseline mentation stating she did not wish to hurt herself or anybody else and requested to be discharged home and she was referred to [facility].  Later that same day patient was returned to the ER by the  police.  Patient was found laying down in a parking lot and acting bizarre.  She apparently told the police that she had put methamphetamines in her pants.  Patient kept reaching for vagina and it was suspected she may have placed drugs intravaginally.  No drugs were ever found.  EEG and MRI were completed with no acute abnormalities; urine drug screen positive for THC as well as Ativan (which had been given in the ED).  She underwent a lumbar puncture which was negative and was empirically started on antibiotics to treat potential infectious etiology to her encephalopathy.  After admission this patient developed seizure activity which progressed to status epilepticus therefore she was transferred to [facility] for further neurological evaluation.  After several days recurrent seizures were aborted with combination of 3 antiepileptic drugs.  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  Unfortunately she did not improve.  Because of persistent altered mentation and inability to sustain appropriate oral intake a PEG tube was placed.  This tube was eventually discontinued after patient became alert and awake enough to eat consistently during the latter portion of the hospitalization.  Since admission patient has had varying levels of awakeness and orientation as well as labile mood and affect.  Essentially for the first 8 weeks of hospitalization patient would have brief episodes of being awake, sometimes oriented sometimes not.  These episodes would last for several minutes to several hours but were never sustained.  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  Multiple psychotropic medications were tried without success or caused significant side effects and were discontinued.  Decision made to taper and wean all psychotropic drugs.  After this was done patient  became extremely alert and agitated.  She was very impulsive and was demonstrating hypersexual behavior as well as extreme mood swings.  Seroquel tried initially to manage brain injury sequela but required high dosages unfortunately contributing to patient's depression and suicidal ideation.  It was noted that each time patient was given IM Geodon that she became alert and for the most part oriented, was able to consistently communicate and perform ADLs.  Eventually she was transitioned to oral Geodon.  Naltrexone had been started to assist with underlying hypersexual behaviors and impulsivity and to treat her underlying heroin addiction since she had not had any methadone or heroin since admission.  She has also been placed on Klonopin and clonidine to aid with her severe anxiety.  Recently patient has had issues with extreme agitation around the holidays and was acting out with wandering behaviors, spitting on staff, using profanity in the hallways and fighting with security staff when restraints were applied.  Some of her behaviors have been manipulative especially in regards to attempting  to obtain drugs such as benzodiazepines or narcotics.  Neurology was reconsulted.  Repeat MRI unremarkable and it is felt current behaviors are combination of brain injury sequela as well as underlying psychiatric issues from chronic drug abuse.  Recently patient has become focused on obtaining methadone.  Typically when she becomes anxious or restless she request some sort of drug: " I need a hot shot" (Ambien), " I need something for my anxiety I cannot take it anymore"; and most recently she is focused on obtaining methadone.  She remains confused and alternates between sad and depressed or angry and defiant.  Subjective: Awakened patient.  Very docile and compliant this morning.  She makes eye contact when spoken to.  She knew she was in the hospital but did not know what year it was stating "honestly I really do not  know what year it is".  Explained she had been in the hospital for 3 months and likely has some sort of problem with her brain because she had uncontrolled seizures for at least 3 days after coming to the hospital.  Updated her on other history, medications, and plans to make ensure her behavior/physical abilities have improved enough before she could potentially go home.  Explained that she cannot take methadone since she is being treated with naltrexone for her heroin addiction.  Also explained to her that her son [name] is too young to visit and that [name] had seen her when she was sicker and not acting like herself and it frightened him and he has chosen at this point not to visit.  This information did not upset the patient.  I also reminded her that her middle child [name] remains in the custody of her father and no one knows where they reside.  Again patient did not become upset when presented with this information.  She was somewhat drowsy and went back to sleep.  **1223 pm: Patient became agitated again.  Walked out of her room and was demanding methadone and was asking where her boyfriend was who stole the methadone from her.  Redirection and other methods were used but unable to calm patient.  Eventually required a dose of Geodon IM.  Of note last IM dose had been at 6 PM last night so this actually is an improvement.  Subsequently after receiving IM Geodon patient became sedated and is now resting quietly in the bed.  Objective: Vitals:   04/20/20 1347 04/21/20 0544  BP: 95/74 134/81  Pulse: 79 62  Resp: 19 18  Temp: 98.3 F (36.8 C) 97.6 F (36.4 C)  SpO2: 100% 100%    Intake/Output Summary (Last 24 hours) at 04/21/2020 0736 Last data filed at 04/21/2020 0557 Gross per 24 hour  Intake 480 ml  Output --  Net 480 ml   Filed Weights   03/29/20 0500 04/04/20 0632 04/05/20 0613  Weight: 72.5 kg 70.9 kg 71.6 kg    Exam: General: Awakened, calm and pleasant Pulmonary: Room air  with pulse oximetry 97%.  Bilateral lung sounds clear to auscultation anteriorly Cardiac: S1-S2 without rubs murmurs thrills or gallops.  No peripheral edema. Abdomen:, Abdomen soft nontender nondistended.  Bowel sounds present and normoactive.  LBM 12/27 Neurological: Moves all extremities x4, no focal neurological deficits, no tremors or rigidity/EPS sx's Psychiatric: Awakened, calm and pleasant this morning.  Oriented to name and place but not to year.  Making eye contact today and listening to conversation and responding appropriately.     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  Neurology has signed off -Vimpat, Dilantin and phenobarbital have been tapered and discontinued at recommendation of neurologist -Continue naltrexone for impulsivity and hypersexuality -Continue one-to-one safety sitter and restraints as needed for aggressive behavior -In regards to her bizarre behavior she was exhibiting these same symptoms on date of admission and 24 hours previous to admission -On 12/30 a.m. behavior much more appropriate and back to previous awake baseline we have seen several weeks ago but will need to continue to monitor as medication effects wax and wane.  History of polysubstance abuse with heroin and methamphetamine -Prescribed methadone 60 mg daily from a clinic in [place]  -Has not required methadone since arrival -Continue naltrexone to treat heroin addiction.  This is the last drug clinicians utilize to maintain  stability after patients have been successful with methadone.  Would not return to methadone at this juncture.  It appears patient is requesting methadone due to the euphoria she knows this drug will provide  Depression and severe anxiety disorder/PTSD -This past weekend during Christmas patient became extremely anxious and was acting out requiring application of restraints, initiation of one-to-one sitter and IVC -Prior to this patient had been complaining of severe anxiety and requesting medications therefore Klonopin was initiated at 2 mg every 6 hours.  As of 12/28 this medication was decreased to 1 mg every 6 hours; she had previously been placed on Tegretol 400 mg BID as adjunctive therapy for her anxiety as recommendation of psychiatry which was continued -She was reevaluated by psychiatry who recommended continuing current drugs except for recommendation to transition from Tegretol to Depakote if LFTs increase in the context of her underlying hepatitis C -Vistaril discontinued due to drug to drug interaction with Geodon -Lexapro initiated on 12/24 and dose maximized on 12/27 to 20 mg HS -Behaviors have remained better controlled with no requirement of restraints and no significant aggressive or violent behavior in 72 hours therefore will discontinue IVC. -Appreciate assistance of psychiatry team  Abnormal urinalysis -Not consistent with UTI, was positive for moderate hemoglobin and greater than 50 RBCs with an elevated specific gravity -Suspect dehydration -Patient denies back pain or history of renal calculi although given her confusion unclear if this history is accurate    Other problems: Nonobstructive transaminitis in context of hepatitis C -Elevated HCV antibody with markedly elevated HCV RNA quantitative level  consistent with chronic hepatitis C  -LFTs remain stable without an obstructive pattern noted -11/10: Discussed with ID Dr. Manson Passey. Treatment of hepatitis C typically  is initiated in the outpatient setting. Medications can be quite expensive and usually take time to obtain insurance approval.  -ID recommends scheduling outpatient appointment their clinic closer to discharge date  Back pain 2/2 abnormal urinalysis consistent with UTI -Patient with low-grade fever overnight and is now having back pain for days not responsive to NSAIDs and muscle relaxers -DG lumbar spine unremarkable -During hospitalization patient has been treated for pansensitive E. coli UTI as well as Salmonella UTI both of which have been sensitive to Levaquin -Completed Levaquin 12/20-urine culture obtained after antibiotics initiated and showed no growth -CT  abdomen and pelvis on 12/17 unremarkable  Salmonella UTI -Completed amoxicillin  Physical deconditioning -Resolved -Does continue to exhibit impulsivity but maintains appropriate balance and gait no longer requires one-to-one safety sitter  Large hemorrhoids. -Resolved -Markedly improved-LD Proctofoam 12/15  Dysphagia -Resolved -Continue regular diet   Data Reviewed: Basic Metabolic Panel: Recent Labs  Lab 04/16/20 1417 04/21/20 0040  NA 140 137  K 3.9 4.3  CL 103 101  CO2 28 27  GLUCOSE 137* 83  BUN 16 16  CREATININE 0.63 0.58  CALCIUM 9.0 8.9   Liver Function Tests: Recent Labs  Lab 04/16/20 1417 04/21/20 0040  AST 64* 59*  ALT 53* 65*  ALKPHOS 143* 143*  BILITOT 0.2* 0.2*  PROT 5.8* 6.0*  ALBUMIN 3.1* 3.1*   No results for input(s): LIPASE, AMYLASE in the last 168 hours. No results for input(s): AMMONIA in the last 168 hours. CBC: Recent Labs  Lab 04/16/20 1417  WBC 5.3  HGB 12.1  HCT 37.3  MCV 98.4  PLT 334   Cardiac Enzymes: No results for input(s): CKTOTAL, CKMB, CKMBINDEX, TROPONINI in the last 168 hours. BNP (last 3 results) No results for input(s): BNP in the last 8760 hours.  ProBNP (last 3 results) No results for input(s): PROBNP in the last 8760 hours.  CBG: No  results for input(s): GLUCAP in the last 168 hours.  No results found for this or any previous visit (from the past 240 hour(s)).   Studies: MR BRAIN WO CONTRAST  Result Date: 04/19/2020 CLINICAL DATA:  Delirium. Additional history provided: 34 year old female who presented [facility]/06/2020 within cephalopathy, EEG at that time showing status epilepticus arising from left central temporal region. EXAM: MRI HEAD WITHOUT CONTRAST TECHNIQUE: Multiplanar, multiecho pulse sequences of the brain and surrounding structures were obtained without intravenous contrast. COMPARISON:  Brain MRI 01/19/2020. FINDINGS: Brain: Mild intermittent motion degradation. Cerebral volume is normal. No focal parenchymal signal abnormality is identified. There is no acute infarct. No evidence of intracranial mass. No chronic intracranial blood products. No extra-axial fluid collection. No midline shift. Vascular: Expected proximal arterial flow voids. Skull and upper cervical spine: No focal marrow lesion. Sinuses/Orbits: Visualized orbits show no acute finding. No significant paranasal sinus disease at the imaged levels. IMPRESSION: Mild intermittent motion degradation. Within this limitation, there is an unremarkable non-contrast MRI appearance of the brain. No evidence of acute intracranial abnormality. Electronically Signed   By: Jackey Loge DO   On: 04/19/2020 08:50    Scheduled Meds: . carbamazepine  400 mg Oral TID  . Chlorhexidine Gluconate Cloth  6 each Topical Daily  . clonazePAM  1 mg Oral Q6H  . cloNIDine  0.1 mg Transdermal Weekly  . diclofenac Sodium  4 g Topical QID  . escitalopram  20 mg Oral QHS  . feeding supplement  237 mL Oral TID BM  . Gerhardt's butt cream  1 application Topical TID  . hydrOXYzine  50 mg Intramuscular Once  . melatonin  5 mg Oral QHS  . multivitamin with minerals  1 tablet Oral Daily  . naltrexone  50 mg Oral Daily  . polyethylene glycol  17 g Oral Daily  .  vitamin B-6  100 mg Oral Daily  . thiamine  100 mg Oral Daily  . ziprasidone  60 mg Oral 2 times per day   Continuous Infusions: . sodium chloride      Principal Problem:   Delirium due to multiple etiologies Active Problems:   Acute metabolic encephalopathy  Hypokalemia   Polysubstance abuse (HCC)   Elevated CK   Transaminitis   Refractory seizure (HCC)   Chronic hepatitis C without hepatic coma (HCC)   Distended abdomen   Palliative care encounter   Colonic Ileus (HCC)   Organic brain syndrome   On enteral nutrition   Physical deconditioning   Protein-calorie malnutrition (HCC)   Inadequate oral nutritional intake   Impulse disorder   Consultants:  Neurology  Psychiatry  Interventional radiology  Surgery    Procedures:  9/14 lumbar puncture  9/15 EEG  9/16 EEG  9/17 EEG  9/19 overnight EEG with video  9/22 overnight EEG with video with discontinuation of long-term EEG monitoring on 9/25  9/24 core track placement  10/6 EEG  12/15 PEG tube removed    Antibiotics: Anti-infectives (From admission, onward)   Start     Dose/Rate Route Frequency Ordered Stop   04/05/20 1415  levofloxacin (LEVAQUIN) tablet 500 mg        500 mg Oral Daily 04/05/20 1327 04/11/20 0911   03/19/20 1000  metroNIDAZOLE (FLAGYL) tablet 500 mg  Status:  Discontinued        500 mg Per Tube 2 times daily 03/19/20 0822 03/23/20 0827   03/18/20 1200  amoxicillin (AMOXIL) 250 MG/5ML suspension 500 mg  Status:  Discontinued        500 mg Per Tube Every 8 hours 03/18/20 0915 03/23/20 0559   03/17/20 1200  cefTRIAXone (ROCEPHIN) 1 g in sodium chloride 0.9 % 100 mL IVPB  Status:  Discontinued        1 g 200 mL/hr over 30 Minutes Intravenous Every 24 hours 03/17/20 1048 03/18/20 0913   03/16/20 1130  metroNIDAZOLE (FLAGYL) 50 mg/ml oral suspension 500 mg  Status:  Discontinued        500 mg Per Tube 2 times daily 03/16/20 1040 03/19/20 0821   03/16/20 1130  fluconazole (DIFLUCAN)  40 MG/ML suspension 152 mg        150 mg Per Tube  Once 03/16/20 1040 03/16/20 1245   02/11/20 1526  ceFAZolin (ANCEF) IVPB 2g/100 mL premix        2 g 200 mL/hr over 30 Minutes Intravenous  Once 02/11/20 1526 02/11/20 1641   02/05/20 0600  ceFAZolin (ANCEF) IVPB 2g/100 mL premix        2 g 200 mL/hr over 30 Minutes Intravenous To Short Stay 02/04/20 1054 02/05/20 0950   01/29/20 0600  ceFAZolin (ANCEF) IVPB 2g/100 mL premix        2 g 200 mL/hr over 30 Minutes Intravenous On call to O.R. 01/28/20 1511 01/30/20 0559   01/28/20 2000  cefTRIAXone (ROCEPHIN) 1 g in sodium chloride 0.9 % 100 mL IVPB        1 g 200 mL/hr over 30 Minutes Intravenous Every 24 hours 01/28/20 1925 02/02/20 0724   01/06/20 0900  cefTRIAXone (ROCEPHIN) 2 g in sodium chloride 0.9 % 100 mL IVPB  Status:  Discontinued        2 g 200 mL/hr over 30 Minutes Intravenous Every 12 hours 01/06/20 0105 01/06/20 2202   01/06/20 0800  vancomycin (VANCOCIN) IVPB 1000 mg/200 mL premix  Status:  Discontinued       "Followed by" Linked Group Details   1,000 mg 200 mL/hr over 60 Minutes Intravenous Every 12 hours 01/05/20 1904 01/06/20 2202   01/05/20 2000  vancomycin (VANCOREADY) IVPB 1500 mg/300 mL       "Followed by" Linked Group Details  1,500 mg 150 mL/hr over 120 Minutes Intravenous  Once 01/05/20 1904 01/06/20 0127   01/05/20 1830  cefTRIAXone (ROCEPHIN) 2 g in sodium chloride 0.9 % 100 mL IVPB        2 g 200 mL/hr over 30 Minutes Intravenous  Once 01/05/20 1829 01/05/20 2220        Time spent: 25 minutes    Junious SilkAllison Breken Nazari ANP  [facility] 7 am - 330 pm/M-F 106 days

## 2020-04-21 NOTE — Plan of Care (Signed)
Patient ambulating in hall. Did not go into other patients rooms.  Problem: Education: Goal: Knowledge of General Education information will improve Description: Including pain rating scale, medication(s)/side effects and non-pharmacologic comfort measures Outcome: Progressing   Problem: Clinical Measurements: Goal: Ability to maintain clinical measurements within normal limits will improve Outcome: Progressing Goal: Will remain free from infection Outcome: Progressing Goal: Diagnostic test results will improve Outcome: Progressing Goal: Respiratory complications will improve Outcome: Progressing Goal: Cardiovascular complication will be avoided Outcome: Progressing   Problem: Activity: Goal: Risk for activity intolerance will decrease Outcome: Progressing   Problem: Nutrition: Goal: Adequate nutrition will be maintained Outcome: Progressing   Problem: Coping: Goal: Level of anxiety will decrease Outcome: Progressing   Problem: Elimination: Goal: Will not experience complications related to bowel motility Outcome: Progressing Goal: Will not experience complications related to urinary retention Outcome: Progressing   Problem: Pain Managment: Goal: General experience of comfort will improve Outcome: Progressing   Problem: Safety: Goal: Ability to remain free from injury will improve Outcome: Progressing   Problem: Skin Integrity: Goal: Risk for impaired skin integrity will decrease Outcome: Progressing   Problem: Education: Goal: Expressions of having a comfortable level of knowledge regarding the disease process will increase Outcome: Progressing   Problem: Coping: Goal: Ability to adjust to condition or change in health will improve Outcome: Progressing Goal: Ability to identify appropriate support needs will improve Outcome: Progressing   Problem: Health Behavior/Discharge Planning: Goal: Compliance with prescribed medication regimen will improve Outcome:  Progressing   Problem: Medication: Goal: Risk for medication side effects will decrease Outcome: Progressing   Problem: Clinical Measurements: Goal: Complications related to the disease process, condition or treatment will be avoided or minimized Outcome: Progressing Goal: Diagnostic test results will improve Outcome: Progressing   Problem: Safety: Goal: Verbalization of understanding the information provided will improve Outcome: Progressing   Problem: Self-Concept: Goal: Level of anxiety will decrease Outcome: Progressing Goal: Ability to verbalize feelings about condition will improve Outcome: Progressing   Problem: Education: Goal: Ability to state activities that reduce stress will improve Outcome: Progressing   Problem: Coping: Goal: Ability to identify and develop effective coping behavior will improve Outcome: Progressing   Problem: Self-Concept: Goal: Ability to identify factors that promote anxiety will improve Outcome: Progressing Goal: Level of anxiety will decrease Outcome: Progressing Goal: Ability to modify response to factors that promote anxiety will improve Outcome: Progressing   Problem: Education: Goal: Utilization of techniques to improve thought processes will improve Outcome: Progressing Goal: Knowledge of the prescribed therapeutic regimen will improve Outcome: Progressing   Problem: Activity: Goal: Interest or engagement in leisure activities will improve Outcome: Progressing Goal: Imbalance in normal sleep/wake cycle will improve Outcome: Progressing   Problem: Coping: Goal: Coping ability will improve Outcome: Progressing Goal: Will verbalize feelings Outcome: Progressing   Problem: Health Behavior/Discharge Planning: Goal: Ability to make decisions will improve Outcome: Progressing Goal: Compliance with therapeutic regimen will improve Outcome: Progressing   Problem: Role Relationship: Goal: Will demonstrate positive changes  in social behaviors and relationships Outcome: Progressing   Problem: Safety: Goal: Ability to disclose and discuss suicidal ideas will improve Outcome: Progressing Goal: Ability to identify and utilize support systems that promote safety will improve Outcome: Progressing   Problem: Self-Concept: Goal: Will verbalize positive feelings about self Outcome: Progressing Goal: Level of anxiety will decrease Outcome: Progressing   Problem: Education: Goal: Verbalization of understanding the information provided will improve Outcome: Progressing   Problem: Coping: Goal: Ability to demonstrate appropriate behavior when  angry will improve Outcome: Progressing Goal: Ability to identify and develop effective coping behavior will improve Outcome: Progressing Goal: Ability to interact with others will improve Outcome: Progressing   Problem: Health Behavior/Discharge Planning: Goal: Identification of resources available to assist in meeting health care needs will improve Outcome: Progressing

## 2020-04-22 MED ORDER — BENZOCAINE 10 % MT GEL
Freq: Four times a day (QID) | OROMUCOSAL | Status: DC | PRN
  Filled 2020-04-22 (×4): qty 9

## 2020-04-22 NOTE — Progress Notes (Signed)
Nutrition Follow-up  DOCUMENTATION CODES:   Not applicable  INTERVENTION:   -ContinueEnsure Enlive poTID, each supplement provides 350 kcal and 20 grams of protein -Continue MVI with minerals daily -ContinueMagic cup TID with meals, each supplement provides 290 kcal and 9 grams of protein  NUTRITION DIAGNOSIS:   Inadequate oral intake related to lethargy/confusion as evidenced by meal completion < 25%.  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 PMH includes hepatitis and polysubstance abuse.  9/24 Cortrak placed (gastric) 10/10 Cortrak dislodged, TF held 10/11 Cortrak advanced (tip in proximal duodenum per KUB), TF restarted 10/14 TF reduced to 49ml/hr due to colonic ileus 10/15 G-tube placement 10/16 TF resumed 10/18 TF rate returned to 53ml/hr; G-tube pulled out 1 inch so TF held 10/21 G-tube replaced 10/27 diet advanced to regular 12/15 PEG tube removed  Reviewed I/O's: +780 ml x 24 hours and +3.9 L since 04/08/20  UOP: 300 ml x 24 hours  Pt with good appetite; noted meal completion 50-100%. Pt is intermittently refusing Ensure Enlive supplements.   Medications reviewed and include miralax, vitamin B-6, thiamine, and melatonin.   Per TOC notes, still searching for potential SNF placement.   Labs reviewed.   Diet Order:   Diet Order            Diet regular Room service appropriate? No; Fluid consistency: Thin  Diet effective now                 EDUCATION NEEDS:   No education needs have been identified at this time  Skin:  Skin Assessment: Skin Integrity Issues: Skin Integrity  Issues:: Incisions Incisions: lt upper abdomen  Last BM:  04/14/20  Height:   Ht Readings from Last 1 Encounters:  01/06/20 5\' 5"  (1.651 m)    Weight:   Wt Readings from Last 1 Encounters:  04/05/20 71.6 kg   BMI:  Body mass index is 26.27 kg/m.  Estimated Nutritional Needs:   Kcal:  1800-2000  Protein:  90-100 grams  Fluid:  >/=1.8L/d    04/07/20, RD, LDN, CDCES Registered Dietitian II Certified Diabetes Care and Education Specialist Please refer to [facility] for RD and/or RD on-call/weekend/after hours pager

## 2020-04-22 NOTE — Progress Notes (Signed)
Occupational Therapy Treatment [name] MRN: [id number] DOB: Sep 19, 1985 Today's Date: 04/22/2020    History of present illness 34 year old with past medical history significant for hepatitis C, polysubstance abuse brought to the hospital 9/13 for disorientation which was suspected to be substance abuse.  After she became more lucid, she was discharged, but then police brought her back later in the day and they found her passed out in a parking lot.  Urine drug screen was positive for benzodiazepine and THC. An EEG done on 9/15 showed patient was in status epilepticus.  Patient was a started on Keppra.  Despite these, EEG noted continued seizures requiring additional medications as well.  Finally on 9/18, seizures broke. Follow-up EEG on 9/20 noted evidence of epilepticity from the left central temporal region.  Repeat EEG done on 9/22 noted epileptogenicity from the left central temporal region.  Pt with PEG placed on 02/05/20 removed on 12/15   OT comments  Pt completed shower this session completing goal and progressing on other set goals. Pt could benefit from a schedule each day to have a predictable expectation of her daily routine to help with cognitive deficits/ behaviors. Pt in a dark room on arrival. Pt liable after the completion of shower suddenly and making comments that do not relate to any discussion being had with staff. Pt internally distracted and fabricating thoughts in response to shower. recommendation for SNF.   Follow Up Recommendations  SNF    Equipment Recommendations  None recommended by OT    Recommendations for Other Services Other (comment) (continued psych (A))    Precautions / Restrictions Precautions Precautions: Fall Precaution Comments: seizures       Mobility Bed Mobility Overal bed mobility: Needs Assistance Bed Mobility: Supine to Sit;Sit to Supine     Supine to sit: Min assist Sit to supine: Modified independent  (Device/Increase time)   General bed mobility comments: pt requires (A) to initiate OOB even with verbal agreement to engage in task  Transfers Overall transfer level: Needs assistance     Sit to Stand: Supervision              Balance                                           ADL either performed or assessed with clinical judgement   ADL Overall ADL's : Needs assistance/impaired     Grooming: Oral care;Set up Grooming Details (indicate cue type and reason): pt completed oral care with setup Upper Body Bathing: Min guard;Sitting Upper Body Bathing Details (indicate cue type and reason): pt was able to turn the water temperature to the correct temperature with no cues from staff Lower Body Bathing: Min guard;Sitting/lateral leans Lower Body Bathing Details (indicate cue type and reason): able to sit and wash bil Le . pt needs cues to wash peri area as she is on her menstral cycle and neglected care until cued Upper Body Dressing : Minimal assistance;Standing   Lower Body Dressing: Min guard;Sit to/from stand Lower Body Dressing Details (indicate cue type and reason): pt insisting on don one clothing at a time Toilet Transfer: Min guard           Functional mobility during ADLs: Min guard General ADL Comments: pt liable after completion of adls     Vision       Perception  Praxis      Cognition Arousal/Alertness: Awake/alert Behavior During Therapy: Flat affect Overall Cognitive Status: Impaired/Different from baseline                                 General Comments: pt flat affected and described by staff as being in the bed sleeping majority of the day. pt agreeable to shower adn after completion becoming very liable. pt states "he said give her french fries he didnt hurt her" and then when returning supine states "i know what you trying to do . its not over. i know what you are doing"        Exercises Other  Exercises Other Exercises: sitter in room reports patient just completed 3 laps on 6n unit which is equal to 1/2 miles. Pt then completes full adl showing increased activity tolerance.   Shoulder Instructions       General Comments noted to be on menstral cycle and new pad/ underwear provided. pt taking fulls shower this session with staff helping wash back with cloth. pt completed shower otherwise in seated position.    Pertinent Vitals/ Pain       Pain Assessment: No/denies pain Pain Score: 0-No pain  Home Living                                          Prior Functioning/Environment              Frequency  Min 1X/week        Progress Toward Goals  OT Goals(current goals can now be found in the care plan section)  Progress towards OT goals: Progressing toward goals  Acute Rehab OT Goals Patient Stated Goal: wanting the purewick from sitter as OT leaving room OT Goal Formulation: With patient Time For Goal Achievement: 05/06/20 Potential to Achieve Goals: Good ADL Goals Pt Will Perform Eating: with modified independence;sitting (met) Pt Will Perform Grooming: with modified independence;standing (progressing toward) Additional ADL Goal #1: Pt will folow 5 step pathfinding task with written instructions min (A) Additional ADL Goal #2: pt will complete full adl at sink level supervision level (met) Additional ADL Goal #3: pt will gather all adls items in room needed to complete bath mod i  Plan Discharge plan remains appropriate    Co-evaluation                 AM-PAC OT "6 Clicks" Daily Activity     Outcome Measure   Help from another person eating meals?: None Help from another person taking care of personal grooming?: A Little Help from another person toileting, which includes using toliet, bedpan, or urinal?: A Little Help from another person bathing (including washing, rinsing, drying)?: A Little Help from another person to put on  and taking off regular upper body clothing?: A Little Help from another person to put on and taking off regular lower body clothing?: A Little 6 Click Score: 19    End of Session    OT Visit Diagnosis: Unsteadiness on feet (R26.81);Muscle weakness (generalized) (M62.81);Pain;Other symptoms and signs involving the nervous system (R29.898);Other symptoms and signs involving cognitive function;Cognitive communication deficit (R41.841);Ataxia, unspecified (R27.0);Feeding difficulties (R63.3);Other abnormalities of gait and mobility (R26.89)   Activity Tolerance Patient tolerated treatment well   Patient Left in bed;with bed alarm set;with nursing/sitter in room  Nurse Communication Mobility status;Precautions        Time: 0018-0970 OT Time Calculation (min): 31 min  Charges: OT General Charges $OT Visit: 1 Visit OT Treatments $Self Care/Home Management : 23-37 mins   Brynn, OTR/L  Acute Rehabilitation Services Pager: [phone] Office: [phone] .    Jeri Modena 04/22/2020, 3:52 PM

## 2020-04-22 NOTE — Progress Notes (Addendum)
Pt wanted to go out for a walk. Pt put on her slippers and mask to go for a walk. Pt did attempt to walk into other rooms, but pt was easy to redirect by constant verbal redirection. Pt walked back to her room and lunch arrived. Pt sitting on her bed, eating her lunch. Will continue to monitor pt.

## 2020-04-22 NOTE — Progress Notes (Signed)
Pt went for another walk on the hallway. Pt attempted to go into another pt.s room. Pt verbal redirected. Pt now back in her room, pt still continues to ask for the scanner and to give her the last of the heroin. Will continue to monitor pt,

## 2020-04-22 NOTE — Progress Notes (Signed)
Pt went out for a walk again, pt again attempted to walk into other rooms. When pt went back into the room, pt grabbed the scanner of the computer of this writer and pointed the scanner to her chest then said "squirt me with it". This writer told pt that she was not entirely sure what the pt meant but that the scanner only scans. Scanner taken from pt. Pt needs constant verbal redirection to understand, will continue to monitor pt.

## 2020-04-22 NOTE — Progress Notes (Signed)
Pt pacing in room, when sitting pt making abnormal posturing of extremities and paranoid statements noted. Pt not answering questions, attempting to remove secured medical equipment from wall. Geodon IM given, pt tolerated administration without need to contain. Will continue to monitor

## 2020-04-22 NOTE — Progress Notes (Signed)
Pt went out for a walk around the entire unit. Pt walked around and did not attempt to go into any rooms, pt was speaking a little more this time stating "yes", "please", or "is this my room?". Compared to this morning, pt did not attempt to go into any room whenever she asked first if that was her room. Pt still needs verbal redirection, will continue to monitor pt.

## 2020-04-22 NOTE — Progress Notes (Addendum)
Pt.s step mother is visiting pt. Pt sitting up and eating her fruit that was brought by step mother. Pt also eating her Mindi Slicker food brought by step mother. Will continue to monitor pt.

## 2020-04-22 NOTE — Progress Notes (Signed)
Pt began to cry, when this writer asked pt why she was crying, pt only stated "oh, you already know". This writer attempted to provide comfort to pt, but pt stood up and walked back to the computer. Pt grabbed the scanner and placed it on her head. Pt then pressed the button to scan, this writer took the scanner of pt. Pt then stated "it has the last of the heroin.". Told pt that the scanner does not have such things. Pt has set her mind that the scanner in the room has heroin, will occasionally asked this writer if she can 'hit her up' with the scanner, because it has heroin. Will continue to monitor pt.

## 2020-04-22 NOTE — Progress Notes (Signed)
[facility] PROGRESS NOTE  Anita Davis GGY:[id number] DOB: April 17, 1986 DOA: 01/04/2020 PCP: [name], PA-C    02/10/20                      02/15/20                       04/08/20   Status: Remains inpatient appropriate because:Altered mental status, Unsafe d/c plan and Inpatient level of care appropriate due to severity of illness   Dispo: The patient is from: Home              Anticipated d/c is to: SNF              Anticipated d/c date is: > 3 days              Patient currently is medically stable to d/c.  Barriers to discharge: Recent worsening of underlying psychiatric condition requiring short-term IVC in restraints.  IVC removed as of 12/30 and patient has not required restraints in 72 hours.  Also likely needs SNF due to behavioral issues.  It is hopeful at some point patient behavioral stabilize to where family can manage her at home    Code Status: DNR Family Communication: Stepmother [name] 12/29 DVT prophylaxis: Consistently ambulatory so no DVT prophylaxis indicated Vaccination status: Fully vaccinated against Covid with second vaccine given on 02/23/2020  Pertinent social history Patient has 3 children: [name] and [name]'s father has primary custody and no one in the family knows where they reside With permission from patient's father and stepmother I contacted [name] (telephone number [phone]) who has legal custody of [name] and [name]; at this juncture [name] states that after extensive discussions with [name] he no longer wishes to visit his mother in person.  [name] is only 7 and it is not appropriate for him to visit his mother at this point.  Prior to the patient's acute illness [name] states that [name] had regular frequent supervised visits with these children.  The possibility of children recording video greetings to their mother has been discussed and she will need to discuss this with her husband before agreeing. Mrs.  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  She presented to [facility] ER 3 times prior to admission.  On her 4th presentation she was admitted.  On 9/2 she was diagnosed with acute cystitis with hematuria and was discharged home on Keflex and Zofran.  9/9 she presented with anxiety and symptoms consistent with a panic attack.  She was discharged from the ER with prescription for Vistaril.  She again presented to the ER on 9/10  reporting significant anxiety and severe insomnia and stating the Vistaril was not working.  She was given Ativan while in the ER and this appeared to improve her symptoms.  She was stable for discharge and instructed to take Benadryl at bedtime to assist with her insomnia.  On 9/13 patient brought to the ER by family.  She  was refusing to answer questions.  Clinician documented sores/lesions on her legs consistent with methamphetamine use.  When questioned regarding this patient reportedly just stared at the clinician.  It was later determined that the patient had been abusing substances (methamphetamine).  Prior to discharge patient appeared to be hallucinating but was able to verbally communicate.  Over the next hour she became more agitated and was given Ativan 2 mg orally. She later returned to her baseline mentation stating she did not wish to hurt herself or anybody else and requested to be discharged home. She was referred to [facility] and discharged by EDP.  Later that same day  patient was returned to the ER by the police.  Patient was found laying down in a parking lot and acting bizarre.  She apparently told the police that she had put methamphetamines in her pants.  Patient kept reaching for vagina and it was suspected she may have placed drugs intravaginally.  No drugs were ever found.  EEG and MRI were completed with no acute abnormalities; urine drug screen positive for THC as well as Ativan (which had been given in the ED).  She underwent a lumbar puncture which was negative and was empirically started on antibiotics to treat potential infectious etiology to her encephalopathy.  After admission this patient developed seizure activity which progressed to status epilepticus therefore she was transferred to [facility] for further neurological evaluation.  After several days recurrent seizures were aborted with combination of 3 antiepileptic drugs.  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  Unfortunately she did not improve.  Because of persistent altered mentation and inability to sustain appropriate oral intake a PEG tube was placed.  This tube was eventually discontinued after patient became alert and awake enough to eat consistently during the latter portion of the hospitalization.  Since admission patient has had varying levels of awakeness and orientation as well as labile mood and affect.  Essentially for the first 8 weeks of hospitalization patient would have brief episodes of being awake, sometimes oriented sometimes not.  These episodes would last for several minutes to several hours but were never sustained.  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  Multiple psychotropic medications were tried without success or caused significant side effects and were discontinued.  Decision made to taper and wean all psychotropic  drugs.  After this was done patient became extremely alert and agitated.  She was very impulsive and was demonstrating hypersexual behavior as well as extreme mood swings.  Seroquel tried initially to manage brain injury sequela but required high dosages unfortunately contributing to patient's depression and suicidal ideation.  It was noted that each time patient was given IM Geodon that she became alert and for the most part oriented, was able to consistently communicate and perform ADLs.  Eventually she was transitioned to oral Geodon.  Naltrexone had been started to assist with underlying hypersexual behaviors and impulsivity and to treat her underlying heroin addiction since she had not had any methadone or heroin since admission.  She has also been placed on Klonopin and clonidine to aid with her severe anxiety.  Recently patient has had issues with extreme agitation around the holidays and was acting out with wandering behaviors, spitting on staff, using profanity in the hallways and fighting with security staff when restraints were applied.  Some of her behaviors have been manipulative  especially in regards to attempting to obtain drugs such as benzodiazepines or narcotics.  Neurology was reconsulted.  Repeat MRI unremarkable and it is felt current behaviors are combination of brain injury sequela as well as underlying psychiatric issues from chronic drug abuse.  Recently patient has become focused on obtaining methadone.  Typically when she becomes anxious or restless she request some sort of drug: " I need a hot shot" (Ambien), " I need something for my anxiety I cannot take it anymore"; and most recently she is focused on obtaining methadone.  She remains confused and alternates between sad and depressed or angry and defiant.  Subjective: Patient sleeping soundly after receiving Geodon around 620 this morning.  Important to note that she had gone nearly 18 hours between doses of IM Geodon for  breakthrough agitation symptoms.  Return to her room just after 1 PM.  Patient alert and visiting with her stepmother although at times does not spontaneously engage in conversation will respond to direct questions.  Emotions labile at times slightly defiant and at other times tearful.  Mains confused.  Objective: Vitals:   04/22/20 0100 04/22/20 0348  BP: 114/61 108/67  Pulse: 78 70  Resp: 19 17  Temp: 97.9 F (36.6 C) 98 F (36.7 C)  SpO2: 100% 98%    Intake/Output Summary (Last 24 hours) at 04/22/2020 1305 Last data filed at 04/22/2020 1144 Gross per 24 hour  Intake 120 ml  Output 300 ml  Net -180 ml   Filed Weights   03/29/20 0500 04/04/20 0632 04/05/20 0613  Weight: 72.5 kg 70.9 kg 71.6 kg    Exam: General: Awake, calm but with some emotional lability but not agitated or aggressive Pulmonary: Room air with pulse oximetry 97%.  Bilateral lung sounds clear to auscultation anteriorly Cardiac: S1-S2 without rubs murmurs thrills or gallops.  No peripheral edema. Abdomen:, Abdomen soft nontender nondistended.  Bowel sounds present and normoactive.  LBM 12/27 Neurological: Moves all extremities x4, no focal neurological deficits, no tremors or rigidity/EPS sx's Psychiatric: Awake, calm but with labile emotions varying from defiant to tearful.  Oriented to name and place..     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  Family related that patient had been on methadone prior to admission and it is unclear whether she was taking  this medication as prescribed.  Patient remains fixated on someone stealing her methadone and states her boyfriend did this. This likely could have occurred patient could have been experiencing methadone withdrawal symptoms prior to admission.  Family report that they were aware of patient's methamphetamine use and that she had been escalating usage/frequency prior to admission.  It is important to note that abnormal behaviors documented during this hospitalization were demonstrated by this patient both in the ER and when she was arrested by police prior to admission. -Currently on Geodon 60 mg BID (dose last increased on 12/28) with IM Geodon 20 mg q 8hrs for breakthrough symptoms.  Antipsychotics are primary pharmacotherapy to use for methamphetamine psychosis which can have a duration of months to years depending on other underlying psychiatric risk factors. -Continue naltrexone for impulsivity and hypersexuality -Continue one-to-one Recruitment consultant and restraints as  needed for aggressive behavior -In regards to her bizarre behavior she was exhibiting these same symptoms on date of admission and 24 hours previous to admission  Status epilepticus -See above regarding methamphetamine use prior to admission -Repeat MRI on 12/28 within normal limits and it is felt behavioral issues related to brain injury sequelae and chronic psychiatric condition.  Neurology has signed off -Vimpat, Dilantin and phenobarbital have been tapered and discontinued at recommendation of neurologist -Discussed with neurologist/Yadav today regarding clarification of preadmission history and excessive methamphetamine use as likely rationale for status epilepticus and she agreed.  History of polysubstance abuse with heroin and methamphetamine -Prescribed methadone 60 mg daily from a clinic in [place]  -Has not required methadone since arrival therefore we will continue naltrexone to treat heroin addiction.  This is the last drug  clinicians utilize to maintain stability after patients have been successful with methadone.  Would not return to methadone at this juncture.  It appears patient is requesting methadone due to the euphoria she knows this drug will provide  Depression and severe anxiety disorder/PTSD/SPECT undiagnosed bipolar disorder -This past weekend during Christmas patient became extremely anxious and was acting out requiring application of restraints, initiation of one-to-one sitter and IVC -Prior to this patient had been complaining of severe anxiety and requesting medications therefore Klonopin was initiated at 2 mg every 6 hours.  As of 12/28 this medication was decreased to 1 mg every 6 hours; she had previously been placed on Tegretol 400 mg BID as adjunctive therapy for her anxiety as recommendation of psychiatry which was continued -She was reevaluated by psychiatry who recommended continuing current drugs except for recommendation to transition from Tegretol to Depakote if LFTs increase in the context of her underlying hepatitis C-as of 12/30 LFTs remain stable. -Vistaril discontinued due to drug to drug interaction with Geodon -Lexapro initiated on 12/24 and dose maximized on 12/27 to 20 mg HS -Behaviors have remained better controlled with no requirement of restraints and no significant aggressive or violent behavior in 72 hours therefore will discontinue IVC. -Appreciate assistance of psychiatry team  Abnormal urinalysis -Not consistent with UTI, was positive for moderate hemoglobin and greater than 50 RBCs with an elevated specific gravity -Suspect dehydration -Patient denies back pain or history of renal calculi although given her confusion unclear if this history is accurate    Other problems: Nonobstructive transaminitis in context of hepatitis C -Elevated HCV antibody with markedly elevated HCV RNA quantitative level  consistent with chronic hepatitis C  -LFTs remain stable without an  obstructive pattern noted -11/10: Discussed with ID Dr. Manson Passey. Treatment of hepatitis C typically is initiated in the outpatient setting. Medications can be quite expensive and usually take time to obtain insurance approval.  -ID recommends scheduling outpatient appointment their clinic closer to discharge date  Back pain 2/2 abnormal urinalysis consistent with UTI -Patient with low-grade fever overnight and is now having back pain for days not responsive to NSAIDs and muscle relaxers -DG lumbar spine unremarkable -During hospitalization patient has been treated for pansensitive E. coli UTI as well as Salmonella UTI both of which have been sensitive to Levaquin -Completed Levaquin 12/20-urine culture obtained after antibiotics initiated and showed no growth -CT abdomen and pelvis on 12/17 unremarkable  Salmonella UTI -Completed amoxicillin  Physical deconditioning -Resolved -Does continue to exhibit impulsivity but maintains appropriate balance and gait no longer requires one-to-one safety sitter  Large hemorrhoids. -Resolved -Markedly improved-LD Proctofoam 12/15  Dysphagia -Resolved -Continue regular diet   Data  Reviewed: Basic Metabolic Panel: Recent Labs  Lab 04/16/20 1417 04/21/20 0040  NA 140 137  K 3.9 4.3  CL 103 101  CO2 28 27  GLUCOSE 137* 83  BUN 16 16  CREATININE 0.63 0.58  CALCIUM 9.0 8.9   Liver Function Tests: Recent Labs  Lab 04/16/20 1417 04/21/20 0040  AST 64* 59*  ALT 53* 65*  ALKPHOS 143* 143*  BILITOT 0.2* 0.2*  PROT 5.8* 6.0*  ALBUMIN 3.1* 3.1*   No results for input(s): LIPASE, AMYLASE in the last 168 hours. No results for input(s): AMMONIA in the last 168 hours. CBC: Recent Labs  Lab 04/16/20 1417  WBC 5.3  HGB 12.1  HCT 37.3  MCV 98.4  PLT 334   Cardiac Enzymes: No results for input(s): CKTOTAL, CKMB, CKMBINDEX, TROPONINI in the last 168 hours. BNP (last 3 results) No results for input(s): BNP in the last 8760  hours.  ProBNP (last 3 results) No results for input(s): PROBNP in the last 8760 hours.  CBG: No results for input(s): GLUCAP in the last 168 hours.  No results found for this or any previous visit (from the past 240 hour(s)).   Studies: No results found.  Scheduled Meds: . carbamazepine  400 mg Oral TID  . Chlorhexidine Gluconate Cloth  6 each Topical Daily  . clonazePAM  1 mg Oral Q6H  . cloNIDine  0.1 mg Transdermal Weekly  . diclofenac Sodium  4 g Topical QID  . escitalopram  20 mg Oral QHS  . feeding supplement  237 mL Oral TID BM  . Gerhardt's butt cream  1 application Topical TID  . hydrOXYzine  50 mg Intramuscular Once  . melatonin  5 mg Oral QHS  . multivitamin with minerals  1 tablet Oral Daily  . naltrexone  50 mg Oral Daily  . polyethylene glycol  17 g Oral Daily  . vitamin B-6  100 mg Oral Daily  . thiamine  100 mg Oral Daily  . ziprasidone  60 mg Oral 2 times per day   Continuous Infusions: . sodium chloride      Principal Problem:   Delirium due to multiple etiologies Active Problems:   Acute metabolic encephalopathy   Hypokalemia   Polysubstance abuse (HCC)   Elevated CK   Transaminitis   Refractory seizure (HCC)   Chronic hepatitis C without hepatic coma (HCC)   Distended abdomen   Palliative care encounter   Colonic Ileus (HCC)   Organic brain syndrome   On enteral nutrition   Physical deconditioning   Protein-calorie malnutrition (HCC)   Inadequate oral nutritional intake   Impulse disorder   Consultants:  Neurology  Psychiatry  Interventional radiology  Surgery    Procedures:  9/14 lumbar puncture  9/15 EEG  9/16 EEG  9/17 EEG  9/19 overnight EEG with video  9/22 overnight EEG with video with discontinuation of long-term EEG monitoring on 9/25  9/24 core track placement  10/6 EEG  12/15 PEG tube removed    Antibiotics: Anti-infectives (From admission, onward)   Start     Dose/Rate Route Frequency Ordered  Stop   04/05/20 1415  levofloxacin (LEVAQUIN) tablet 500 mg        500 mg Oral Daily 04/05/20 1327 04/11/20 0911   03/19/20 1000  metroNIDAZOLE (FLAGYL) tablet 500 mg  Status:  Discontinued        500 mg Per Tube 2 times daily 03/19/20 0822 03/23/20 0827   03/18/20 1200  amoxicillin (AMOXIL) 250 MG/5ML suspension  500 mg  Status:  Discontinued        500 mg Per Tube Every 8 hours 03/18/20 0915 03/23/20 0559   03/17/20 1200  cefTRIAXone (ROCEPHIN) 1 g in sodium chloride 0.9 % 100 mL IVPB  Status:  Discontinued        1 g 200 mL/hr over 30 Minutes Intravenous Every 24 hours 03/17/20 1048 03/18/20 0913   03/16/20 1130  metroNIDAZOLE (FLAGYL) 50 mg/ml oral suspension 500 mg  Status:  Discontinued        500 mg Per Tube 2 times daily 03/16/20 1040 03/19/20 0821   03/16/20 1130  fluconazole (DIFLUCAN) 40 MG/ML suspension 152 mg        150 mg Per Tube  Once 03/16/20 1040 03/16/20 1245   02/11/20 1526  ceFAZolin (ANCEF) IVPB 2g/100 mL premix        2 g 200 mL/hr over 30 Minutes Intravenous  Once 02/11/20 1526 02/11/20 1641   02/05/20 0600  ceFAZolin (ANCEF) IVPB 2g/100 mL premix        2 g 200 mL/hr over 30 Minutes Intravenous To Short Stay 02/04/20 1054 02/05/20 0950   01/29/20 0600  ceFAZolin (ANCEF) IVPB 2g/100 mL premix        2 g 200 mL/hr over 30 Minutes Intravenous On call to O.R. 01/28/20 1511 01/30/20 0559   01/28/20 2000  cefTRIAXone (ROCEPHIN) 1 g in sodium chloride 0.9 % 100 mL IVPB        1 g 200 mL/hr over 30 Minutes Intravenous Every 24 hours 01/28/20 1925 02/02/20 0724   01/06/20 0900  cefTRIAXone (ROCEPHIN) 2 g in sodium chloride 0.9 % 100 mL IVPB  Status:  Discontinued        2 g 200 mL/hr over 30 Minutes Intravenous Every 12 hours 01/06/20 0105 01/06/20 2202   01/06/20 0800  vancomycin (VANCOCIN) IVPB 1000 mg/200 mL premix  Status:  Discontinued       "Followed by" Linked Group Details   1,000 mg 200 mL/hr over 60 Minutes Intravenous Every 12 hours 01/05/20 1904 01/06/20  2202   01/05/20 2000  vancomycin (VANCOREADY) IVPB 1500 mg/300 mL       "Followed by" Linked Group Details   1,500 mg 150 mL/hr over 120 Minutes Intravenous  Once 01/05/20 1904 01/06/20 0127   01/05/20 1830  cefTRIAXone (ROCEPHIN) 2 g in sodium chloride 0.9 % 100 mL IVPB        2 g 200 mL/hr over 30 Minutes Intravenous  Once 01/05/20 1829 01/05/20 2220       Time spent: 25 minutes    Junious SilkAllison Camora Tremain ANP  [facility] 7 am - 330 pm/M-F 107 days

## 2020-04-22 NOTE — Progress Notes (Signed)
Went for another walked, in hopes her anxiety would go down, but pt.s anxiety did not go dow. Pt requested for ativan. RN aware, pt back in room. Will continue to monitor pt.

## 2020-04-22 NOTE — Progress Notes (Signed)
Pt. Wanted to go for another walk, halfway through her walk pt noticed a wheelchair and asked to be pushed instead. Pt strolled around the rest of the unit. Before pt was brought back into her room, the pt asked "May I go and speak to the Father?". Asked pt if she wanted to speak to the [place] provided for the hospital, but pt remained silent. Pt back in bed but now silently crying while talking to herself. Will continue to monitor pt.

## 2020-04-23 MED ORDER — STERILE WATER FOR INJECTION IJ SOLN
INTRAMUSCULAR | Status: AC
  Filled 2020-04-23: qty 10

## 2020-04-23 MED ORDER — STERILE WATER FOR INJECTION IJ SOLN
INTRAMUSCULAR | Status: AC
  Administered 2020-04-23: 10 mL
  Filled 2020-04-23: qty 10

## 2020-04-23 NOTE — Progress Notes (Signed)
Pt jumped out of bed, stood in front of sitter's face and said, "I'm going to take your head off!". RN at bedside. Pt severely agitated and banging on window stating, "I want my life back!". RN provided emotional support to pt and encouraged pt to express feelings calmly. Pt did not respond. Security to bedside. PRN Geodon IM admin. Will continue to monitor.

## 2020-04-23 NOTE — Progress Notes (Signed)
Informed during shift hand off that patient currently does not have a sitter assigned.  Patient agitated, attempting to leave her room, frequent attempts to enter rooms of other patients.  Per sitter, patient is argumentative and getting more agitated.  Charge nurse informed, nurse tech assigned to sit with patient.  PRN Geodon administered.
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
## 2020-04-24 LAB — CBC WITH DIFFERENTIAL/PLATELET
Abs Immature Granulocytes: 0.02 10*3/uL (ref 0.00–0.07)
Basophils Absolute: 0 10*3/uL (ref 0.0–0.1)
Basophils Relative: 0 %
Eosinophils Absolute: 0.2 10*3/uL (ref 0.0–0.5)
Eosinophils Relative: 4 %
HCT: 38.9 % (ref 36.0–46.0)
Hemoglobin: 13.8 g/dL (ref 12.0–15.0)
Immature Granulocytes: 0 %
Lymphocytes Relative: 30 %
Lymphs Abs: 1.6 10*3/uL (ref 0.7–4.0)
MCH: 33.5 pg (ref 26.0–34.0)
MCHC: 35.5 g/dL (ref 30.0–36.0)
MCV: 94.4 fL (ref 80.0–100.0)
Monocytes Absolute: 0.5 10*3/uL (ref 0.1–1.0)
Monocytes Relative: 9 %
Neutro Abs: 3 10*3/uL (ref 1.7–7.7)
Neutrophils Relative %: 57 %
Platelets: 343 10*3/uL (ref 150–400)
RBC: 4.12 MIL/uL (ref 3.87–5.11)
RDW: 12.6 % (ref 11.5–15.5)
WBC: 5.3 10*3/uL (ref 4.0–10.5)
nRBC: 0 % (ref 0.0–0.2)

## 2020-04-24 LAB — COMPREHENSIVE METABOLIC PANEL
ALT: 78 U/L — ABNORMAL HIGH (ref 0–44)
AST: 80 U/L — ABNORMAL HIGH (ref 15–41)
Albumin: 3.7 g/dL (ref 3.5–5.0)
Alkaline Phosphatase: 163 U/L — ABNORMAL HIGH (ref 38–126)
Anion gap: 14 (ref 5–15)
BUN: 12 mg/dL (ref 6–20)
CO2: 26 mmol/L (ref 22–32)
Calcium: 9.2 mg/dL (ref 8.9–10.3)
Chloride: 98 mmol/L (ref 98–111)
Creatinine, Ser: 0.65 mg/dL (ref 0.44–1.00)
GFR, Estimated: >60 mL/min (ref >60)
Glucose, Bld: 113 mg/dL — ABNORMAL HIGH (ref 70–99)
Potassium: 4.3 mmol/L (ref 3.5–5.1)
Sodium: 138 mmol/L (ref 135–145)
Total Bilirubin: 0.7 mg/dL (ref 0.3–1.2)
Total Protein: 6.6 g/dL (ref 6.5–8.1)

## 2020-04-24 LAB — PHOSPHORUS: Phosphorus: 4.5 mg/dL (ref 2.5–4.6)

## 2020-04-24 LAB — MAGNESIUM: Magnesium: 2.1 mg/dL (ref 1.7–2.4)

## 2020-04-24 MED ORDER — STERILE WATER FOR INJECTION IJ SOLN
INTRAMUSCULAR | Status: AC
  Filled 2020-04-24: qty 10

## 2020-04-24 MED ORDER — STERILE WATER FOR INJECTION IJ SOLN
INTRAMUSCULAR | Status: AC
  Administered 2020-04-24: 1.2 mL
  Filled 2020-04-24: qty 10

## 2020-04-24 MED ORDER — CARBAMAZEPINE 200 MG PO TABS
600.0000 mg | ORAL_TABLET | Freq: Three times a day (TID) | ORAL | Status: DC
  Administered 2020-04-24 (×3): 600 mg via ORAL
  Filled 2020-04-24 (×4): qty 3

## 2020-04-24 NOTE — Progress Notes (Signed)
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  She presented to [facility] ER 3 times prior to admission.  On her 4th presentation she was admitted.  On 9/2 she was diagnosed with acute cystitis with hematuria and was discharged home on Keflex and Zofran.  9/9 she presented with anxiety and symptoms consistent with a panic attack.  She was discharged from the ER with prescription for Vistaril.  She again presented to the ER on 9/10  reporting significant anxiety and severe insomnia and stating the Vistaril was not working.  She was given Ativan while in the ER and this appeared to improve her symptoms.  She was stable for discharge and instructed to take Benadryl at bedtime to assist with her insomnia.  On 9/13 patient brought to the ER by family.  She was refusing to answer questions.  Clinician documented sores/lesions on her legs consistent with methamphetamine use.  When questioned regarding this patient reportedly just stared at the clinician.  It was later determined that the patient had been abusing substances (methamphetamine).  Prior to discharge patient appeared to be hallucinating but was able to verbally communicate.  Over the next hour she became more agitated and was given Ativan 2 mg orally. She later returned to her baseline mentation stating she did not wish to hurt herself or anybody else and requested to be discharged home. She was referred to [facility] and discharged by EDP.  Later that same day patient was returned to the ER by the police.  Patient was found laying down in a parking lot and acting bizarre.  She apparently told the police that she had put methamphetamines in her pants.  Patient kept reaching for vagina and it was suspected she may have placed  drugs intravaginally.  No drugs were ever found.  EEG and MRI were completed with no acute abnormalities; urine drug screen positive for THC as well as Ativan (which had been given in the ED).  She underwent a lumbar puncture which was negative and was empirically started on antibiotics to treat potential infectious etiology to her encephalopathy.  After admission this patient developed seizure activity which progressed to status epilepticus therefore she was transferred to [facility] for further neurological evaluation.  After several days recurrent seizures were aborted with combination of 3 antiepileptic drugs.  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  Unfortunately she did not improve.  Because of persistent altered mentation and inability to sustain appropriate oral intake a PEG tube was placed.  This tube was eventually discontinued after patient became alert and awake enough to eat consistently during the latter portion of the hospitalization.  Since admission patient has had varying levels of awakeness and orientation as well as labile mood and affect.  Essentially for the first 8 weeks of hospitalization patient would have brief episodes of being awake, sometimes oriented sometimes not.  These episodes would last for several minutes to several hours but were never sustained.  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  Multiple psychotropic medications were tried without success or caused significant side effects and were discontinued.  Decision made to taper and wean all psychotropic drugs.  After this  was done patient became extremely alert and agitated.  She was very impulsive and was demonstrating hypersexual behavior as well as extreme mood swings.  Seroquel tried initially to manage brain injury sequela but required high dosages  unfortunately contributing to patient's depression and suicidal ideation.  It was noted that each time patient was given IM Geodon that she became alert and for the most part oriented, was able to consistently communicate and perform ADLs.  Eventually she was transitioned to oral Geodon.  Naltrexone had been started to assist with underlying hypersexual behaviors and impulsivity and to treat her underlying heroin addiction since she had not had any methadone or heroin since admission.  She has also been placed on Klonopin and clonidine to aid with her severe anxiety.  Recently patient has had issues with extreme agitation around the holidays and was acting out with wandering behaviors, spitting on staff, using profanity in the hallways and fighting with security staff when restraints were applied.  Some of her behaviors have been manipulative especially in regards to attempting to obtain drugs such as benzodiazepines or narcotics.  Neurology was reconsulted.  Repeat MRI unremarkable and it is felt current behaviors are combination of brain injury sequela as well as underlying psychiatric issues from chronic drug abuse.  Recently patient has become focused on obtaining methadone.  Typically when she becomes anxious or restless she request some sort of drug: " I need a hot shot" (Ambien), " I need something for my anxiety I cannot take it anymore"; and most recently she is focused on obtaining methadone.  She remains confused and alternates between sad and depressed or angry and defiant.  **04/23/2020 continues to be confused and has emotional outbursts.  She threatened to safety sitter today and stated that "I want to take her head off".  She started also banging on the window and then she had to be given as needed I am Geodon which calmed the patient down.  When I saw her at bedside this morning she was walking around her room and then got into bed and then started crying.  04/24/2020 she continues to be  emotionally labile and was extremely agitated earlier and had to be given Geodon.  We will go up on her carbamazepine to 600 mg 3 times daily given her significant agitation.  When I went in the room she would not respond to me and she was making bizarre gestures with her hands.  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  Family related that patient had been on methadone prior to admission and it is unclear whether she was taking this medication as prescribed.  Patient remains  fixated on someone stealing her methadone and states her boyfriend did this. This likely could have occurred patient could have been experiencing methadone withdrawal symptoms prior to admission.  Family report that they were aware of patient's methamphetamine use and that she had been escalating usage/frequency prior to admission.  It is important to note that abnormal behaviors documented during this hospitalization were demonstrated by this patient both in the ER and when she was arrested by  police prior to admission. -Currently on Geodon 60 mg BID (dose last increased on 12/28) with IM Geodon 20 mg q 8hrs for breakthrough symptoms.  Antipsychotics are primary pharmacotherapy to use for methamphetamine psychosis which can have a duration of months to years depending on other underlying psychiatric risk factors. -Continue naltrexone for impulsivity and hypersexuality -Continue one-to-one safety sitter and restraints as needed for aggressive behavior; Continues to be very Emotionally Labile  -In regards to her bizarre behavior she was exhibiting these same symptoms on date of admission and 24 hours previous to admission; she continues to exhibit bizarre behavior as of today on 04/24/2020  Status epilepticus -See above regarding methamphetamine use prior to admission -Repeat MRI on 12/28 within normal limits and it is felt behavioral issues related to brain injury sequelae and chronic psychiatric condition.  Neurology has signed off -Vimpat, Dilantin and phenobarbital have been tapered and discontinued at recommendation of neurologist -[name], NP discussed with neurologist/ Dr. Melynda Ripple regarding clarification of preadmission history and excessive methamphetamine use as likely rationale for status epilepticus and she agreed.  History of polysubstance abuse with heroin and methamphetamine -Prescribed methadone 60 mg daily from a clinic in [place]  -Has not required methadone since arrival therefore we will continue naltrexone to treat heroin addiction.  This is the last drug clinicians utilize to maintain stability after patients have been successful with methadone.  Would not return to methadone at this juncture.  It appears patient is requesting methadone due to the euphoria she knows this drug will provide  Depression and severe anxiety disorder/PTSD/SPECT undiagnosed bipolar disorder -This past weekend during Christmas patient became extremely anxious and was acting out  requiring application of restraints, initiation of one-to-one sitter and IVC -Prior to this patient had been complaining of severe anxiety and requesting medications therefore Klonopin was initiated at 2 mg every 6 hours.  As of 12/28 this medication was decreased to 1 mg every 6 hours; she had previously been placed on Tegretol 400 mg BID as adjunctive therapy for her anxiety as recommendation of psychiatry which was continued and increased to 3 times daily and we will increase this to 600 mg 3 times daily -She was reevaluated by psychiatry who recommended continuing current drugs except for recommendation to transition from Tegretol to Depakote if LFTs increase in the context of her underlying hepatitis C-as of 12/30 LFTs remain stable. -Vistaril discontinued due to drug to drug interaction with Geodon -Escitalopram initiated on 12/24 and dose maximized on 12/27 to 20 mg HS -Behaviors had remained better controlled with no requirement of restraints and but still continues to exhibit aggressive behaviors that is amenable to Geodon -Appreciate assistance of psychiatry team;  Nonobstructive transaminitis in context of hepatitis C -Elevated HCV antibody with markedly elevated HCV RNA quantitative level  consistent with chronic hepatitis C  -LFTs remain stable without an obstructive pattern noted -11/10: Discussed with ID Dr. Manson Passey. Treatment of hepatitis C typically is initiated in the outpatient setting. Medications can be quite expensive and usually take time to obtain insurance  approval.  -ID recommends scheduling outpatient appointment their clinic closer to discharge date  Back pain 2/2 abnormal urinalysis consistent with UTI -Patient with low-grade fever overnight and is now having back pain for days not responsive to NSAIDs and muscle relaxers -DG lumbar spine unremarkable -During hospitalization patient has been treated for pansensitive E. coli UTI as well as Salmonella UTI both of  which have been sensitive to Levaquin -Completed Levaquin 12/20-urine culture obtained after antibiotics initiated and showed no growth -CT abdomen and pelvis on 12/17 unremarkable  Salmonella UTI -Completed Amoxicillin on 03/22/2020 and also received 5 Days of Levofloxacin  Physical Deconditioning -Resolved -Does continue to exhibit impulsivity but maintains appropriate balance and gait; Still requires a Recruitment consultant  Large hemorrhoids. -Resolved -Markedly improved-LD Proctofoam 12/15  Dysphagia -Resolved -Continue regular diet  Abnormal LFTs -Mildly elevated -Patient is AST has gone from 64 -> 59 -> 80 -He has gone from 53 -> 65 -> 78 -Repeat CMP intermittently -Has a HX of Hepatitis C   DVT prophylaxis: SCDs; Pharmacological Prophylaxis discontinued as she ambulates Code Status: DNR  Family Communication: No family present at bedside  Disposition Plan: SNF when bed is available and her behaviors are improved  Status is: Inpatient  Remains inpatient appropriate because:Unsafe d/c plan, IV treatments appropriate due to intensity of illness or inability to take PO and Inpatient level of care appropriate due to severity of illness   Dispo: The patient is from: Home              Anticipated d/c is to: SNF              Anticipated d/c date is: > 3 days              Patient currently is not medically stable to d/c. Recent worsening of underlying psychiatric condition requiring short-term IVC in restraints.  IVC removed as of 12/30 and patient has not required restraints in 72 hours.  Also likely needs SNF due to behavioral issues.  It is hopeful at some point patient behavioral stabilize to where family can manage her at home   Consultants:   Neurology  Psychiatry  Interventional radiology  Surgery    Procedures:   9/14 lumbar puncture  9/15 EEG  9/16 EEG  9/17 EEG  9/19 overnight EEG with video  9/22 overnight EEG with video with discontinuation of  long-term EEG monitoring on 9/25  9/24 core track placement  10/6 EEG  12/15 PEG tube removed  Antimicrobials:  Anti-infectives (From admission, onward)   Start     Dose/Rate Route Frequency Ordered Stop   04/05/20 1415  levofloxacin (LEVAQUIN) tablet 500 mg        500 mg Oral Daily 04/05/20 1327 04/11/20 0911   03/19/20 1000  metroNIDAZOLE (FLAGYL) tablet 500 mg  Status:  Discontinued        500 mg Per Tube 2 times daily 03/19/20 0822 03/23/20 0827   03/18/20 1200  amoxicillin (AMOXIL) 250 MG/5ML suspension 500 mg  Status:  Discontinued        500 mg Per Tube Every 8 hours 03/18/20 0915 03/23/20 0559   03/17/20 1200  cefTRIAXone (ROCEPHIN) 1 g in sodium chloride 0.9 % 100 mL IVPB  Status:  Discontinued        1 g 200 mL/hr over 30 Minutes Intravenous Every 24 hours 03/17/20 1048 03/18/20 0913   03/16/20 1130  metroNIDAZOLE (FLAGYL) 50 mg/ml oral suspension 500 mg  Status:  Discontinued  500 mg Per Tube 2 times daily 03/16/20 1040 03/19/20 0821   03/16/20 1130  fluconazole (DIFLUCAN) 40 MG/ML suspension 152 mg        150 mg Per Tube  Once 03/16/20 1040 03/16/20 1245   02/11/20 1526  ceFAZolin (ANCEF) IVPB 2g/100 mL premix        2 g 200 mL/hr over 30 Minutes Intravenous  Once 02/11/20 1526 02/11/20 1641   02/05/20 0600  ceFAZolin (ANCEF) IVPB 2g/100 mL premix        2 g 200 mL/hr over 30 Minutes Intravenous To Short Stay 02/04/20 1054 02/05/20 0950   01/29/20 0600  ceFAZolin (ANCEF) IVPB 2g/100 mL premix        2 g 200 mL/hr over 30 Minutes Intravenous On call to O.R. 01/28/20 1511 01/30/20 0559   01/28/20 2000  cefTRIAXone (ROCEPHIN) 1 g in sodium chloride 0.9 % 100 mL IVPB        1 g 200 mL/hr over 30 Minutes Intravenous Every 24 hours 01/28/20 1925 02/02/20 0724   01/06/20 0900  cefTRIAXone (ROCEPHIN) 2 g in sodium chloride 0.9 % 100 mL IVPB  Status:  Discontinued        2 g 200 mL/hr over 30 Minutes Intravenous Every 12 hours 01/06/20 0105 01/06/20 2202   01/06/20  0800  vancomycin (VANCOCIN) IVPB 1000 mg/200 mL premix  Status:  Discontinued       "Followed by" Linked Group Details   1,000 mg 200 mL/hr over 60 Minutes Intravenous Every 12 hours 01/05/20 1904 01/06/20 2202   01/05/20 2000  vancomycin (VANCOREADY) IVPB 1500 mg/300 mL       "Followed by" Linked Group Details   1,500 mg 150 mL/hr over 120 Minutes Intravenous  Once 01/05/20 1904 01/06/20 0127   01/05/20 1830  cefTRIAXone (ROCEPHIN) 2 g in sodium chloride 0.9 % 100 mL IVPB        2 g 200 mL/hr over 30 Minutes Intravenous  Once 01/05/20 1829 01/05/20 2220       Subjective: Seen and examined at bedside and she was extremely agitated this morning and received Geodon nursing was unable to redirect but when I walked in the room she was calmer but was making bizarre behaviors and would not talk to me.  She continues to have a sitter in place.  No other concerns or complaints at the time and will be going up in her carbamazepine to 600 mg 3 times daily.  Objective: Vitals:   04/23/20 1705 04/23/20 2027 04/24/20 0451 04/24/20 1458  BP: 110/84 121/83 (!) 121/92 118/70  Pulse: 79 80 85 86  Resp: 18 16 18 18   Temp: 97.9 F (36.6 C) 98.3 F (36.8 C) 97.8 F (36.6 C) (!) 97.4 F (36.3 C)  TempSrc: Oral Oral Axillary Axillary  SpO2: 97% 97% 96% 96%  Weight:      Height:        Intake/Output Summary (Last 24 hours) at 04/24/2020 1511 Last data filed at 04/24/2020 1247 Gross per 24 hour  Intake 280 ml  Output 800 ml  Net -520 ml   Filed Weights   03/29/20 0500 04/04/20 [id number] 04/05/20 [id number]  Weight: 72.5 kg 70.9 kg 71.6 kg   Examination: Physical Exam:  Constitutional: WN/WD overweight Caucasian female who is making bizarre gestures waving her hands and continues to be confused and emotionally labile Eyes: Lids and conjunctivae normal, sclerae anicteric  ENMT: External Ears, Nose appear normal. Grossly normal hearing.  Neck: Appears normal, supple, no  cervical masses, normal ROM, no  appreciable thyromegaly; no JVD Respiratory: Diminished to auscultation bilaterally, no wheezing, rales, rhonchi or crackles. Normal respiratory effort and patient is not tachypenic. No accessory muscle use.  Unlabored breathing Cardiovascular: RRR, no murmurs / rubs / gallops. S1 and S2 auscultated.  No extremity edema noted Abdomen: Soft, non-tender, distended secondary body habitus. Bowel sounds positive.  GU: Deferred. Musculoskeletal: No clubbing / cyanosis of digits/nails. No joint deformity upper and lower extremities.   Skin: No rashes, lesions, ulcers on limited skin evaluation. No induration; Warm and dry.  Neurologic: CN 2-12 grossly intact with no focal deficits. Romberg sign and cerebellar reflexes not assessed.  Moves extremities independently Psychiatric: Impaired judgment and insight.  She is awake but not alert and oriented x 3. Normal mood and appropriate affect.   Data Reviewed: I have personally reviewed following labs and imaging studies  CBC: Recent Labs  Lab 04/24/20 0707  WBC 5.3  NEUTROABS 3.0  HGB 13.8  HCT 38.9  MCV 94.4  PLT 343   Basic Metabolic Panel: Recent Labs  Lab 04/21/20 0040 04/24/20 0707  NA 137 138  K 4.3 4.3  CL 101 98  CO2 27 26  GLUCOSE 83 113*  BUN 16 12  CREATININE 0.58 0.65  CALCIUM 8.9 9.2  MG  --  2.1  PHOS  --  4.5   GFR: Estimated Creatinine Clearance: 96.4 mL/min (by C-G formula based on SCr of 0.65 mg/dL). Liver Function Tests: Recent Labs  Lab 04/21/20 0040 04/24/20 0707  AST 59* 80*  ALT 65* 78*  ALKPHOS 143* 163*  BILITOT 0.2* 0.7  PROT 6.0* 6.6  ALBUMIN 3.1* 3.7   No results for input(s): LIPASE, AMYLASE in the last 168 hours. No results for input(s): AMMONIA in the last 168 hours. Coagulation Profile: No results for input(s): INR, PROTIME in the last 168 hours. Cardiac Enzymes: No results for input(s): CKTOTAL, CKMB, CKMBINDEX, TROPONINI in the last 168 hours. BNP (last 3 results) No results for  input(s): PROBNP in the last 8760 hours. HbA1C: No results for input(s): HGBA1C in the last 72 hours. CBG: No results for input(s): GLUCAP in the last 168 hours. Lipid Profile: No results for input(s): CHOL, HDL, LDLCALC, TRIG, CHOLHDL, LDLDIRECT in the last 72 hours. Thyroid Function Tests: No results for input(s): TSH, T4TOTAL, FREET4, T3FREE, THYROIDAB in the last 72 hours. Anemia Panel: No results for input(s): VITAMINB12, FOLATE, FERRITIN, TIBC, IRON, RETICCTPCT in the last 72 hours. Sepsis Labs: No results for input(s): PROCALCITON, LATICACIDVEN in the last 168 hours.  No results found for this or any previous visit (from the past 240 hour(s)).   RN Pressure Injury Documentation:     Estimated body mass index is 26.27 kg/m as calculated from the following:   Height as of this encounter: 5\' 5"  (1.651 m).   Weight as of this encounter: 71.6 kg.  Malnutrition Type:  Nutrition Problem: Inadequate oral intake Etiology: lethargy/confusion   Malnutrition Characteristics:  Signs/Symptoms: meal completion < 25%   Nutrition Interventions:  Interventions: Tube feeding,Prostat     Radiology Studies: No results found.  Scheduled Meds: . carbamazepine  600 mg Oral TID  . Chlorhexidine Gluconate Cloth  6 each Topical Daily  . clonazePAM  1 mg Oral Q6H  . cloNIDine  0.1 mg Transdermal Weekly  . diclofenac Sodium  4 g Topical QID  . escitalopram  20 mg Oral QHS  . feeding supplement  237 mL Oral TID BM  . Gerhardt's butt  cream  1 application Topical TID  . hydrOXYzine  50 mg Intramuscular Once  . melatonin  5 mg Oral QHS  . multivitamin with minerals  1 tablet Oral Daily  . naltrexone  50 mg Oral Daily  . polyethylene glycol  17 g Oral Daily  . vitamin B-6  100 mg Oral Daily  . sterile water (preservative free)      . thiamine  100 mg Oral Daily  . ziprasidone  60 mg Oral 2 times per day   Continuous Infusions: . sodium chloride      LOS: 109 days   Merlene Laughter, DO [facility] PAGER is on AMION  If 7PM-7AM, please contact night-coverage [email or web address]

## 2020-04-24 NOTE — Progress Notes (Signed)
Pt repeatedly stating, "Take me to the machine. Shoot me out." RN asked pt what she means by this. Pt did not respond. RN provided emotional support and reassurance to pt. Sitter remains at bedside. Will continue to monitor.

## 2020-04-24 NOTE — Progress Notes (Signed)
Sitter reports patient is agitated, unable to redirect.  Patient making paranoid comments and abnormal posturing of extremities and not answering questions. Patient arguing with sitter, pulling linens off bed, unable to redirect/comfort.  PRN Geodon given for agitation, tolerated administration.  Patient repositioned in bed for safety, room darkened, television off, sitter at bedside.

## 2020-04-24 NOTE — Progress Notes (Signed)
Patient awakened from sleep, calm and able to follow commands.  Patient took scheduled meds without difficulty. Resting in bed, denies pain, no needs voiced.

## 2020-04-25 MED ORDER — ZIPRASIDONE HCL 20 MG PO CAPS
20.0000 mg | ORAL_CAPSULE | Freq: Once | ORAL | Status: AC
  Administered 2020-04-25: 20 mg via ORAL
  Filled 2020-04-25: qty 1

## 2020-04-25 MED ORDER — ZIPRASIDONE HCL 80 MG PO CAPS
80.0000 mg | ORAL_CAPSULE | ORAL | Status: DC
  Filled 2020-04-25 (×2): qty 1

## 2020-04-25 MED ORDER — CARBAMAZEPINE 200 MG PO TABS
600.0000 mg | ORAL_TABLET | Freq: Three times a day (TID) | ORAL | Status: DC
  Administered 2020-04-25 – 2020-05-19 (×71): 600 mg via ORAL
  Filled 2020-04-25 (×76): qty 3

## 2020-04-25 MED ORDER — CARBAMAZEPINE 200 MG PO TABS: 800.0000 mg | ORAL_TABLET | Freq: Three times a day (TID) | ORAL | Status: DC

## 2020-04-25 MED ORDER — CLONAZEPAM 1 MG PO TABS
1.5000 mg | ORAL_TABLET | Freq: Four times a day (QID) | ORAL | Status: DC
  Administered 2020-04-25 – 2020-05-02 (×28): 1.5 mg via ORAL
  Filled 2020-04-25 (×29): qty 1

## 2020-04-25 MED ORDER — MIRTAZAPINE 15 MG PO TABS
30.0000 mg | ORAL_TABLET | Freq: Every day | ORAL | Status: DC
  Administered 2020-04-25 – 2020-05-04 (×10): 30 mg via ORAL
  Filled 2020-04-25 (×10): qty 2

## 2020-04-25 MED ORDER — ZIPRASIDONE HCL 40 MG PO CAPS
80.0000 mg | ORAL_CAPSULE | ORAL | Status: DC
  Administered 2020-04-25 – 2020-07-02 (×134): 80 mg via ORAL
  Filled 2020-04-25 (×140): qty 2

## 2020-04-25 NOTE — Progress Notes (Signed)
Called to patients room by sitter, says patient woke up and said she wanted her pills. Patient requested her Klonopin.  Advised patient her klonopin was not due yet. Patient stated she knew she was to get Klonopin and wanted it as soon as possible. No abnormal movements observed at this time, patient able to answer orientation questions.  Advised scheduled meds would be given when due, patient voiced understanding.

## 2020-04-25 NOTE — Progress Notes (Signed)
Called to patient room by sitter. Sitter said patient told her she was getting agitated and needed her meds.  Upon assessment, patient did not answer orientation questions, just stared forward making abnormal movements. Scheduled nightime PO meds given without difficulty, patient sat up,  held cup of water and medicine cup herself. Abnormal movements stopped immediately, patient then laid down, covered herself with blanket.  Room darkened, television off to decrease stimulation.

## 2020-04-25 NOTE — Progress Notes (Signed)
[facility] PROGRESS NOTE  Anita Davis GGY:[id number] DOB: April 17, 1986 DOA: 01/04/2020 PCP: [name], PA-C    02/10/20                      02/15/20                       04/08/20   Status: Remains inpatient appropriate because:Altered mental status, Unsafe d/c plan and Inpatient level of care appropriate due to severity of illness   Dispo: The patient is from: Home              Anticipated d/c is to: SNF              Anticipated d/c date is: > 3 days              Patient currently is medically stable to d/c.  Barriers to discharge: Recent worsening of underlying psychiatric condition requiring short-term IVC in restraints.  IVC removed as of 12/30 and patient has not required restraints in 72 hours.  Also likely needs SNF due to behavioral issues.  It is hopeful at some point patient behavioral stabilize to where family can manage her at home    Code Status: DNR Family Communication: Stepmother [name] 12/29 DVT prophylaxis: Consistently ambulatory so no DVT prophylaxis indicated Vaccination status: Fully vaccinated against Covid with second vaccine given on 02/23/2020  Pertinent social history Patient has 3 children: [name] and [name]'s father has primary custody and no one in the family knows where they reside With permission from patient's father and stepmother I contacted [name] (telephone number [phone]) who has legal custody of [name] and [name]; at this juncture [name] states that after extensive discussions with [name] he no longer wishes to visit his mother in person.  [name] is only 7 and it is not appropriate for him to visit his mother at this point.  Prior to the patient's acute illness [name] states that [name] had regular frequent supervised visits with these children.  The possibility of children recording video greetings to their mother has been discussed and she will need to discuss this with her husband before agreeing. Mrs.  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  She presented to [facility] ER 3 times prior to admission.  On her 4th presentation she was admitted.  On 9/2 she was diagnosed with acute cystitis with hematuria and was discharged home on Keflex and Zofran.  9/9 she presented with anxiety and symptoms consistent with a panic attack.  She was discharged from the ER with prescription for Vistaril.  She again presented to the ER on 9/10  reporting significant anxiety and severe insomnia and stating the Vistaril was not working.  She was given Ativan while in the ER and this appeared to improve her symptoms.  She was stable for discharge and instructed to take Benadryl at bedtime to assist with her insomnia.  On 9/13 patient brought to the ER by family.  She  was refusing to answer questions.  Clinician documented sores/lesions on her legs consistent with methamphetamine use.  When questioned regarding this patient reportedly just stared at the clinician.  It was later determined that the patient had been abusing substances (methamphetamine).  Prior to discharge patient appeared to be hallucinating but was able to verbally communicate.  Over the next hour she became more agitated and was given Ativan 2 mg orally. She later returned to her baseline mentation stating she did not wish to hurt herself or anybody else and requested to be discharged home. She was referred to [facility] and discharged by EDP.  Later that same day  patient was returned to the ER by the police.  Patient was found laying down in a parking lot and acting bizarre.  She apparently told the police that she had put methamphetamines in her pants.  Patient kept reaching for vagina and it was suspected she may have placed drugs intravaginally.  No drugs were ever found.  EEG and MRI were completed with no acute abnormalities; urine drug screen positive for THC as well as Ativan (which had been given in the ED).  She underwent a lumbar puncture which was negative and was empirically started on antibiotics to treat potential infectious etiology to her encephalopathy.  After admission this patient developed seizure activity which progressed to status epilepticus therefore she was transferred to [facility] for further neurological evaluation.  After several days recurrent seizures were aborted with combination of 3 antiepileptic drugs.  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  Unfortunately she did not improve.  Because of persistent altered mentation and inability to sustain appropriate oral intake a PEG tube was placed.  This tube was eventually discontinued after patient became alert and awake enough to eat consistently during the latter portion of the hospitalization.  Since admission patient has had varying levels of awakeness and orientation as well as labile mood and affect.  Essentially for the first 8 weeks of hospitalization patient would have brief episodes of being awake, sometimes oriented sometimes not.  These episodes would last for several minutes to several hours but were never sustained.  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  Multiple psychotropic medications were tried without success or caused significant side effects and were discontinued.  Decision made to taper and wean all psychotropic  drugs.  After this was done patient became extremely alert and agitated.  She was very impulsive and was demonstrating hypersexual behavior as well as extreme mood swings.  Seroquel tried initially to manage brain injury sequela but required high dosages unfortunately contributing to patient's depression and suicidal ideation.  It was noted that each time patient was given IM Geodon that she became alert and for the most part oriented, was able to consistently communicate and perform ADLs.  Eventually she was transitioned to oral Geodon.  Naltrexone had been started to assist with underlying hypersexual behaviors and impulsivity and to treat her underlying heroin addiction since she had not had any methadone or heroin since admission.  She has also been placed on Klonopin and clonidine to aid with her severe anxiety.  Recently patient has had issues with extreme agitation around the holidays and was acting out with wandering behaviors, spitting on staff, using profanity in the hallways and fighting with security staff when restraints were applied.  Some of her behaviors have been manipulative  especially in regards to attempting to obtain drugs such as benzodiazepines or narcotics.  Neurology was reconsulted.  Repeat MRI unremarkable and it is felt current behaviors are combination of brain injury sequela as well as underlying psychiatric issues from chronic drug abuse.  Recently patient has become focused on obtaining methadone.  Typically when she becomes anxious or restless she request some sort of drug: " I need a hot shot" (Ambien), " I need something for my anxiety I cannot take it anymore"; and most recently she is focused on obtaining methadone.  She remains confused and alternates between sad and depressed or angry and defiant.  Subjective: Patient with mild writhing of lower body while sitting in bed but stopped when directly spoken to.  Initially was nonverbal but later began to engage in simple  monosyllabic phrases.  She was not clear that she was in the hospital and could not remember the year.  Again explained to her what we were doing from the treatment side and we were adjusting her medications to help her think more clearly.  Previously she had been requesting extra Klonopin and has had some spells of excessive agitation requiring Geodon over the past several days although these episodes seem to be occurring less frequently and about every 12-18 hours.  Objective: Vitals:   04/24/20 2030 04/25/20 0517  BP: 121/71 130/62  Pulse: 80 86  Resp: 17 19  Temp: 97.6 F (36.4 C)   SpO2: 99% 100%    Intake/Output Summary (Last 24 hours) at 04/25/2020 1409 Last data filed at 04/24/2020 1735 Gross per 24 hour  Intake 100 ml  Output -  Net 100 ml   Filed Weights   03/29/20 0500 04/04/20 0632 04/05/20 0613  Weight: 72.5 kg 70.9 kg 71.6 kg    Exam: General: Awake, calm but with some emotional lability but not agitated or aggressive Pulmonary: Room air with pulse oximetry 97%.  Bilateral lung sounds clear to auscultation anteriorly Cardiac: S1-S2 without rubs murmurs thrills or gallops.  No peripheral edema. Abdomen:, Abdomen soft nontender nondistended.  Bowel sounds present and normoactive.  LBM 12/27 Neurological: Moves all extremities x4, no focal neurological deficits, no tremors or rigidity/EPS sx's Psychiatric: Awake, calm but with labile emotions varying from defiant to tearful.  Oriented to name and place..     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  Family related that patient had been on methadone prior to admission and it is unclear whether she was taking this medication as prescribed.  Patient remains fixated on someone stealing her methadone and states her boyfriend did this. This likely could have occurred patient could have been experiencing methadone withdrawal symptoms prior to admission.  Family report that they were aware of patient's methamphetamine use and that she had been escalating usage/frequency prior to admission.  It is important to note that abnormal behaviors documented during this hospitalization were demonstrated by this patient both in the ER and when she was arrested by police prior to admission. -Lengthy discussion with pharmacist this morning regarding pharmacotherapy.  Plan is to increase oral Geodon to max dose 80 mg BID and continue with IM Geodon 20 mg q 8hrs for  breakthrough symptoms.  Antipsychotics are primary pharmacotherapy to use for methamphetamine psychosis which can have a duration of months to years depending on other underlying psychiatric risk factors. -Continue naltrexone for impulsivity and hypersexuality -Continue one-to-one safety sitter and restraints as needed for aggressive behavior -In regards to her bizarre behavior she was exhibiting these same symptoms on date of admission and 24 hours previous to admission -1/3: At recommendation of pharmacist we will add Remeron 30 mg HS  Status epilepticus -See above regarding methamphetamine use prior to admission -Repeat MRI on 12/28 within normal limits and it is felt behavioral issues related to brain injury sequelae and chronic psychiatric condition.  Neurology has signed off -Vimpat, Dilantin and phenobarbital have been tapered and discontinued at recommendation of neurologist -Discussed with neurologist/Yadav today regarding clarification of preadmission history and excessive methamphetamine use as likely rationale for status  epilepticus and she agreed.  History of polysubstance abuse with heroin and methamphetamine -Prescribed methadone 60 mg daily from a clinic in [place]  -Has not required methadone since arrival therefore we will continue naltrexone to treat heroin addiction.  This is the last drug clinicians utilize to maintain stability after patients have been successful with methadone.  Would not return to methadone at this juncture.  It appears patient is requesting methadone due to the euphoria she knows this drug will provide  Depression and severe anxiety disorder/PTSD/SPECT undiagnosed bipolar disorder -This past weekend during Christmas patient became extremely anxious and was acting out requiring application of restraints, initiation of one-to-one sitter and IVC -Increase Klonopin to 1.5 mg daily 6hrs (1/3) -1/2 Tegretol increased to 600 mg 3 times daily which is max dose -She was reevaluated by psychiatry who recommended continuing current drugs except for recommendation to transition from Tegretol to Depakote if LFTs increase in the context of her underlying hepatitis C-as of 12/30 LFTs remain stable.  We will repeat again later this week since dosage increased for Tegretol -Vistaril discontinued due to drug to drug interaction with Geodon -Lexapro initiated on 12/24 and dose maximized on 12/27 to 20 mg HS -Behaviors have remained better controlled with no requirement of restraints and no significant aggressive or violent behavior in 72 hours therefore will discontinue IVC. -Appreciate assistance of psychiatry team  Abnormal urinalysis -Not consistent with UTI, was positive for moderate hemoglobin and greater than 50 RBCs with an elevated specific gravity -Suspect dehydration -Patient denies back pain or history of renal calculi although given her confusion unclear if this history is accurate    Other problems: Nonobstructive transaminitis in context of hepatitis C -Elevated HCV antibody with  markedly elevated HCV RNA quantitative level  consistent with chronic hepatitis C  -LFTs remain stable without an obstructive pattern noted -11/10: Discussed with ID Dr. Yvette Rack. Treatment of hepatitis C typically is initiated in the outpatient setting. Medications can be quite expensive and usually take time to obtain insurance approval.  -ID recommends scheduling outpatient appointment their clinic closer to discharge date  Back pain 2/2 abnormal urinalysis consistent with UTI -Patient with low-grade fever overnight and is now having back pain for days not responsive to NSAIDs and muscle relaxers -DG lumbar spine unremarkable -During hospitalization patient has been treated for pansensitive E. coli UTI as well as Salmonella UTI both of which have been sensitive to Levaquin -Completed Levaquin 12/20-urine culture obtained after antibiotics initiated and showed no growth -CT abdomen and pelvis on 12/17 unremarkable  Salmonella UTI -Completed amoxicillin  Physical deconditioning -Resolved -Does continue to exhibit impulsivity but maintains  appropriate balance and gait no longer requires one-to-one safety sitter  Large hemorrhoids. -Resolved -Markedly improved-LD Proctofoam 12/15  Dysphagia -Resolved -Continue regular diet   Data Reviewed: Basic Metabolic Panel: Recent Labs  Lab 04/21/20 0040 04/24/20 0707  NA 137 138  K 4.3 4.3  CL 101 98  CO2 27 26  GLUCOSE 83 113*  BUN 16 12  CREATININE 0.58 0.65  CALCIUM 8.9 9.2  MG  --  2.1  PHOS  --  4.5   Liver Function Tests: Recent Labs  Lab 04/21/20 0040 04/24/20 0707  AST 59* 80*  ALT 65* 78*  ALKPHOS 143* 163*  BILITOT 0.2* 0.7  PROT 6.0* 6.6  ALBUMIN 3.1* 3.7   No results for input(s): LIPASE, AMYLASE in the last 168 hours. No results for input(s): AMMONIA in the last 168 hours. CBC: Recent Labs  Lab 04/24/20 0707  WBC 5.3  NEUTROABS 3.0  HGB 13.8  HCT 38.9  MCV 94.4  PLT 343   Cardiac  Enzymes: No results for input(s): CKTOTAL, CKMB, CKMBINDEX, TROPONINI in the last 168 hours. BNP (last 3 results) No results for input(s): BNP in the last 8760 hours.  ProBNP (last 3 results) No results for input(s): PROBNP in the last 8760 hours.  CBG: No results for input(s): GLUCAP in the last 168 hours.  No results found for this or any previous visit (from the past 240 hour(s)).   Studies: No results found.  Scheduled Meds: . carbamazepine  600 mg Oral TID  . Chlorhexidine Gluconate Cloth  6 each Topical Daily  . clonazePAM  1.5 mg Oral Q6H  . cloNIDine  0.1 mg Transdermal Weekly  . diclofenac Sodium  4 g Topical QID  . escitalopram  20 mg Oral QHS  . feeding supplement  237 mL Oral TID BM  . Gerhardt's butt cream  1 application Topical TID  . hydrOXYzine  50 mg Intramuscular Once  . melatonin  5 mg Oral QHS  . mirtazapine  30 mg Oral QHS  . multivitamin with minerals  1 tablet Oral Daily  . naltrexone  50 mg Oral Daily  . polyethylene glycol  17 g Oral Daily  . vitamin B-6  100 mg Oral Daily  . thiamine  100 mg Oral Daily  . ziprasidone  80 mg Oral 2 times per day   Continuous Infusions: . sodium chloride      Principal Problem:   Delirium due to multiple etiologies Active Problems:   Acute metabolic encephalopathy   Hypokalemia   Polysubstance abuse (HCC)   Elevated CK   Transaminitis   Refractory seizure (HCC)   Chronic hepatitis C without hepatic coma (HCC)   Distended abdomen   Palliative care encounter   Colonic Ileus (HCC)   Organic brain syndrome   On enteral nutrition   Physical deconditioning   Protein-calorie malnutrition (HCC)   Inadequate oral nutritional intake   Impulse disorder   Consultants:  Neurology  Psychiatry  Interventional radiology  Surgery    Procedures:  9/14 lumbar puncture  9/15 EEG  9/16 EEG  9/17 EEG  9/19 overnight EEG with video  9/22 overnight EEG with video with discontinuation of long-term EEG  monitoring on 9/25  9/24 core track placement  10/6 EEG  12/15 PEG tube removed    Antibiotics: Anti-infectives (From admission, onward)   Start     Dose/Rate Route Frequency Ordered Stop   04/05/20 1415  levofloxacin (LEVAQUIN) tablet 500 mg  500 mg Oral Daily 04/05/20 1327 04/11/20 0911   03/19/20 1000  metroNIDAZOLE (FLAGYL) tablet 500 mg  Status:  Discontinued        500 mg Per Tube 2 times daily 03/19/20 0822 03/23/20 0827   03/18/20 1200  amoxicillin (AMOXIL) 250 MG/5ML suspension 500 mg  Status:  Discontinued        500 mg Per Tube Every 8 hours 03/18/20 0915 03/23/20 0559   03/17/20 1200  cefTRIAXone (ROCEPHIN) 1 g in sodium chloride 0.9 % 100 mL IVPB  Status:  Discontinued        1 g 200 mL/hr over 30 Minutes Intravenous Every 24 hours 03/17/20 1048 03/18/20 0913   03/16/20 1130  metroNIDAZOLE (FLAGYL) 50 mg/ml oral suspension 500 mg  Status:  Discontinued        500 mg Per Tube 2 times daily 03/16/20 1040 03/19/20 0821   03/16/20 1130  fluconazole (DIFLUCAN) 40 MG/ML suspension 152 mg        150 mg Per Tube  Once 03/16/20 1040 03/16/20 1245   02/11/20 1526  ceFAZolin (ANCEF) IVPB 2g/100 mL premix        2 g 200 mL/hr over 30 Minutes Intravenous  Once 02/11/20 1526 02/11/20 1641   02/05/20 0600  ceFAZolin (ANCEF) IVPB 2g/100 mL premix        2 g 200 mL/hr over 30 Minutes Intravenous To Short Stay 02/04/20 1054 02/05/20 0950   01/29/20 0600  ceFAZolin (ANCEF) IVPB 2g/100 mL premix        2 g 200 mL/hr over 30 Minutes Intravenous On call to O.R. 01/28/20 1511 01/30/20 0559   01/28/20 2000  cefTRIAXone (ROCEPHIN) 1 g in sodium chloride 0.9 % 100 mL IVPB        1 g 200 mL/hr over 30 Minutes Intravenous Every 24 hours 01/28/20 1925 02/02/20 0724   01/06/20 0900  cefTRIAXone (ROCEPHIN) 2 g in sodium chloride 0.9 % 100 mL IVPB  Status:  Discontinued        2 g 200 mL/hr over 30 Minutes Intravenous Every 12 hours 01/06/20 0105 01/06/20 2202   01/06/20 0800  vancomycin  (VANCOCIN) IVPB 1000 mg/200 mL premix  Status:  Discontinued       "Followed by" Linked Group Details   1,000 mg 200 mL/hr over 60 Minutes Intravenous Every 12 hours 01/05/20 1904 01/06/20 2202   01/05/20 2000  vancomycin (VANCOREADY) IVPB 1500 mg/300 mL       "Followed by" Linked Group Details   1,500 mg 150 mL/hr over 120 Minutes Intravenous  Once 01/05/20 1904 01/06/20 0127   01/05/20 1830  cefTRIAXone (ROCEPHIN) 2 g in sodium chloride 0.9 % 100 mL IVPB        2 g 200 mL/hr over 30 Minutes Intravenous  Once 01/05/20 1829 01/05/20 2220       Time spent: 25 minutes    [name] ANP  [facility] 7 am - 330 pm/M-F 110 days

## 2020-04-26 NOTE — Progress Notes (Signed)
Per [facility], Pt is IVC'd and cannot leave.  [employer] will be sending an officer to serve Pt with paperwork as soon as one becomes available.  Shella Spearing MSW LCSWA Transitions of Care  Clinical Social Worker  [facility]  [phone]
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
## 2020-04-26 NOTE — Social Work (Signed)
Pt is in the process of being IVCed. Pt should not be allowed to leave the hospital at this time.   Jimmy Picket, Theresia Majors, [place] Clinical Social Worker [phone]

## 2020-04-27 ENCOUNTER — Inpatient Hospital Stay (HOSPITAL_COMMUNITY): Payer: Medicaid Other

## 2020-04-27 MED ORDER — POLYETHYLENE GLYCOL 3350 17 G PO PACK
17.0000 g | PACK | Freq: Two times a day (BID) | ORAL | Status: DC
  Administered 2020-04-27 – 2020-06-30 (×76): 17 g via ORAL
  Filled 2020-04-27 (×89): qty 1

## 2020-04-27 MED ORDER — DOCUSATE SODIUM 100 MG PO CAPS
100.0000 mg | ORAL_CAPSULE | Freq: Two times a day (BID) | ORAL | Status: DC
  Administered 2020-04-27 – 2020-07-02 (×121): 100 mg via ORAL
  Filled 2020-04-27 (×126): qty 1

## 2020-04-27 MED ORDER — MAGNESIUM HYDROXIDE 400 MG/5ML PO SUSP
30.0000 mL | Freq: Once | ORAL | Status: AC
  Administered 2020-04-27: 30 mL via ORAL
  Filled 2020-04-27: qty 30

## 2020-04-27 NOTE — Progress Notes (Signed)
Nutrition Follow-up  DOCUMENTATION CODES:   Not applicable  INTERVENTION:   -ContinueEnsure Enlive poTID, each supplement provides 350 kcal and 20 grams of protein -Continue MVI with minerals daily -ContinueMagic cup TID with meals, each supplement provides 290 kcal and 9 grams of protein  NUTRITION DIAGNOSIS:   Inadequate oral intake related to lethargy/confusion as evidenced by meal completion < 25%.  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 PMH includes hepatitis and polysubstance abuse.  9/24 Cortrak placed (gastric) 10/10 Cortrak dislodged, TF held 10/11 Cortrak advanced (tip in proximal duodenum per KUB), TF restarted 10/14 TF reduced to 43ml/hr due to colonic ileus 10/15 G-tube placement 10/16 TF resumed 10/18 TF rate returned to 10ml/hr; G-tube pulled out 1 inch so TF held 10/21 G-tube replaced 10/27 diet advanced to regular 12/15 PEG tube removed  Reviewed I/O's: +239 ml x 24 hours and +5.1 L since 04/13/20  Pt remains with good oral intake. Noted meal completions 50-100%. She is usually accepting of Ensure Enlive supplements.  Medications reviewed and include melatonin, remeron, miralax, vitamin B-6, and thiamine.  Per TOC notes, awaiting potential SNF placement. Noted pt is now IVC.   Labs reviewed.    Diet Order:   Diet Order            Diet regular Room service appropriate? No; Fluid consistency: Thin  Diet effective now                 EDUCATION NEEDS:   No education needs have been identified at this time  Skin:  Skin Assessment: Skin Integrity Issues: Skin Integrity  Issues:: Incisions Incisions: lt upper abdomen  Last BM:  04/18/20  Height:   Ht Readings from Last 1 Encounters:  01/06/20 5\' 5"  (1.651 m)    Weight:   Wt Readings from Last 1 Encounters:  04/05/20 71.6 kg   BMI:  Body mass index is 26.27 kg/m.  Estimated Nutritional Needs:   Kcal:  1800-2000  Protein:  90-100 grams  Fluid:  >/=1.8L/d    04/07/20, RD, LDN, CDCES Registered Dietitian II Certified Diabetes Care and Education Specialist Please refer to [facility] for RD and/or RD on-call/weekend/after hours pager

## 2020-04-27 NOTE — Progress Notes (Signed)
[facility] PROGRESS NOTE  Anita Davis GGY:[id number] DOB: April 17, 1986 DOA: 01/04/2020 PCP: Anita Hawking, PA-C    02/10/20                      02/15/20                       04/08/20   Status: Remains inpatient appropriate because:Altered mental status, Unsafe d/c plan and Inpatient level of care appropriate due to severity of illness   Dispo: The patient is from: Home              Anticipated d/c is to: SNF              Anticipated d/c date is: > 3 days              Patient currently is medically stable to d/c.  Barriers to discharge: Recent worsening of underlying psychiatric condition requiring short-term IVC in restraints.  IVC removed as of 12/30 and patient has not required restraints in 72 hours.  Also likely needs SNF due to behavioral issues.  It is hopeful at some point patient behavioral stabilize to where family can manage her at home    Code Status: DNR Family Communication: Stepmother Anita Davis 12/29 DVT prophylaxis: Consistently ambulatory so no DVT prophylaxis indicated Vaccination status: Fully vaccinated against Covid with second vaccine given on 02/23/2020  Pertinent social history Patient has 3 children: Anita Davis and Anita Davis's father has primary custody and no one in the family knows where they reside With permission from patient's father and stepmother I contacted Anita Davis (telephone number [phone]) who has legal custody of Anita Davis and Anita Davis; at this juncture Anita Davis states that after extensive discussions with Anita Davis he no longer wishes to visit his mother in person.  Anita Davis is only 5 and it is not appropriate for him to visit his mother at this point.  Prior to the patient's acute illness Anita Davis states that Anita Davis had regular frequent supervised visits with these children.  The possibility of children recording video greetings to their mother has been discussed and she will need to discuss this with her husband before agreeing. Mrs.  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  She presented to [facility] ER 3 times prior to admission.  On her 4th presentation she was admitted.  On 9/2 she was diagnosed with acute cystitis with hematuria and was discharged home on Keflex and Zofran.  9/9 she presented with anxiety and symptoms consistent with a panic attack.  She was discharged from the ER with prescription for Vistaril.  She again presented to the ER on 9/10  reporting significant anxiety and severe insomnia and stating the Vistaril was not working.  She was given Ativan while in the ER and this appeared to improve her symptoms.  She was stable for discharge and instructed to take Benadryl at bedtime to assist with her insomnia.  On 9/13 patient brought to the ER by family.  She  was refusing to answer questions.  Clinician documented sores/lesions on her legs consistent with methamphetamine use.  When questioned regarding this patient reportedly just stared at the clinician.  It was later determined that the patient had been abusing substances (methamphetamine).  Prior to discharge patient appeared to be hallucinating but was able to verbally communicate.  Over the next hour she became more agitated and was given Ativan 2 mg orally. She later returned to her baseline mentation stating she did not wish to hurt herself or anybody else and requested to be discharged home. She was referred to [facility] and discharged by EDP.  Later that same day  patient was returned to the ER by the police.  Patient was found laying down in a parking lot and acting bizarre.  She apparently told the police that she had put methamphetamines in her pants.  Patient kept reaching for vagina and it was suspected she may have placed drugs intravaginally.  No drugs were ever found.  EEG and MRI were completed with no acute abnormalities; urine drug screen positive for THC as well as Ativan (which had been given in the ED).  She underwent a lumbar puncture which was negative and was empirically started on antibiotics to treat potential infectious etiology to her encephalopathy.  After admission this patient developed seizure activity which progressed to status epilepticus therefore she was transferred to [facility] for further neurological evaluation.  After several days recurrent seizures were aborted with combination of 3 antiepileptic drugs.  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  Unfortunately she did not improve.  Because of persistent altered mentation and inability to sustain appropriate oral intake a PEG tube was placed.  This tube was eventually discontinued after patient became alert and awake enough to eat consistently during the latter portion of the hospitalization.  Since admission patient has had varying levels of awakeness and orientation as well as labile mood and affect.  Essentially for the first 8 weeks of hospitalization patient would have brief episodes of being awake, sometimes oriented sometimes not.  These episodes would last for several minutes to several hours but were never sustained.  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  Multiple psychotropic medications were tried without success or caused significant side effects and were discontinued.  Decision made to taper and wean all psychotropic  drugs.  After this was done patient became extremely alert and agitated.  She was very impulsive and was demonstrating hypersexual behavior as well as extreme mood swings.  Seroquel tried initially to manage brain injury sequela but required high dosages unfortunately contributing to patient's depression and suicidal ideation.  It was noted that each time patient was given IM Geodon that she became alert and for the most part oriented, was able to consistently communicate and perform ADLs.  Eventually she was transitioned to oral Geodon.  Naltrexone had been started to assist with underlying hypersexual behaviors and impulsivity and to treat her underlying heroin addiction since she had not had any methadone or heroin since admission.  She has also been placed on Klonopin and clonidine to aid with her severe anxiety.  Recently patient has had issues with extreme agitation around the holidays and was acting out with wandering behaviors, spitting on staff, using profanity in the hallways and fighting with security staff when restraints were applied.  Some of her behaviors have been manipulative  especially in regards to attempting to obtain drugs such as benzodiazepines or narcotics.  Neurology was reconsulted.  Repeat MRI unremarkable and it is felt current behaviors are combination of brain injury sequela as well as underlying psychiatric issues from chronic drug abuse.  Recently patient has become focused on obtaining methadone.  Typically when she becomes anxious or restless she request some sort of drug: " I need a hot shot" (Ambien), " I need something for my anxiety I cannot take it anymore"; and most recently she is focused on obtaining methadone.  She remains confused and alternates between sad and depressed or angry and defiant.  Subjective: Awake and anxious earlier this morning with limited verbal communication.  Very fidgety in the bed but when I sat down held her hand and talk to her and discussed  her case and plans for adjustment of medications and explained again why she was in the hospital she calmed down.  Returned after 1 PM to room.  Patient sleeping soundly.  Of note yesterday evening after 4 patient developed extreme aggression and agitation requiring reinstatement of IVC.  Objective: Vitals:   04/26/20 2100 04/27/20 0620  BP: 121/83 125/90  Pulse: 67 61  Resp: 16 16  Temp: 98 F (36.7 C) 97.7 F (36.5 C)  SpO2: 99% 99%    Intake/Output Summary (Last 24 hours) at 04/27/2020 1339 Last data filed at 04/27/2020 1031 Gross per 24 hour  Intake 390 ml  Output 1 ml  Net 389 ml   Filed Weights   03/29/20 0500 04/04/20 2952 04/05/20 8413  Weight: 72.5 kg 70.9 kg 71.6 kg    Exam: General: Awake, somewhat fidgety but overall calm, appears to be disoriented Pulmonary:  Stable on room air, lung sounds are clear Cardiac:  Normal heart sounds S1-S2, no JVD, no Abdomen: Noted patient was drooling and patient and sitter able to communicate that patient was having nausea and not eating as much is typical.  Abdomen soft but more distended. LBM 12/27 Neurological: Moves all extremities x4, no focal neurological deficits, no tremors or rigidity/EPS sx's Psychiatric: Awake, calm, oriented x3.  Answers questions appropriately.     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  On 1/4 patient denies preadmission methamphetamine use (this is contradictory to additional history obtained both from the family and from the medical record) -Continue Geodon 80 mg BID (dose last increased on  1/3). -Continue naltrexone for impulsivity and hypersexuality -Continue one-to-one safety sitter and restraints as needed for aggressive behavior -In regards to her bizarre behavior she was exhibiting these same symptoms on date of admission and 24 hours previous to admission -Remeron 30 mg HS added on 1/3 and the following morning patient awakened with normal behavior patterns and improved orientation the a.m. of 1/4 although by late afternoon early evening patient had an extreme change in behavior with aggression and anger as well as severe confusion requiring reinstatement of IVC.  Formal psychiatric reevaluation pending   Status epilepticus -See above regarding methamphetamine use prior to admission -Repeat MRI on 12/28 within normal limits and it is felt behavioral issues related to brain injury sequelae and chronic psychiatric condition.  Neurology has signed off -Vimpat, Dilantin and phenobarbital have  been tapered and discontinued at recommendation of neurologist -Discussed with neurologist/Yadav today regarding clarification of preadmission history and excessive methamphetamine use as likely rationale for status epilepticus and she agreed.  History of polysubstance abuse with heroin and methamphetamine -Prescribed methadone 60 mg daily from a clinic in [place]  -Has not required methadone since arrival therefore we will continue naltrexone to treat heroin addiction.  This is the last drug clinicians utilize to maintain stability after patients have been successful with methadone.  Would not return to methadone at this juncture.  It appears patient is requesting methadone due to the euphoria she knows this drug will provide  Depression and severe anxiety disorder/PTSD/Suspected undiagnosed bipolar disorder -Patient continues to demonstrate extremes in behavior she is either calm and mostly appropriate although somewhat confused, extremely withdrawn sad and crying, or extremely agitated  and violent and has required IVC status x2 this admission -Continue Klonopin to 1.5 mg daily 6hrs  -Continue Tegretol 600 mg TID (max dose)-check LFTs on 1/6 -She was reevaluated by psychiatry who recommended transition from Tegretol to Depakote if LFTs increase in the context of her underlying hepatitis C -Vistaril discontinued due to drug to drug interaction with Geodon -Lexapro initiated on 12/24 and dose maximized on 12/27 to 20 mg HS -Behaviors have remained better controlled with no requirement of restraints and no significant aggressive or violent behavior in 72 hours therefore will discontinue IVC. -Appreciate assistance of psychiatry team  Recurrent constipation -Last bowel movement 12/27 and patient now with nausea, questionable abdominal pain and poor intake -Review abdominal films today on 1/5 revealed pancolonic stool -We will give milk of magnesia x1 today -Begin Colace 100 mg twice daily x3 days and if has started having bowel movements changed to prn -Increase scheduled MiraLAX to twice daily     Other problems: Nonobstructive transaminitis in context of hepatitis C -Elevated HCV antibody with markedly elevated HCV RNA quantitative level  consistent with chronic hepatitis C  -LFTs remain stable without an obstructive pattern noted -11/10: Discussed with ID Dr. Manson Passey. Treatment of hepatitis C typically is initiated in the outpatient setting. Medications can be quite expensive and usually take time to obtain insurance approval.  -ID recommends scheduling outpatient appointment their clinic closer to discharge date  Back pain 2/2 abnormal urinalysis consistent with UTI -Patient with low-grade fever overnight and is now having back pain for days not responsive to NSAIDs and muscle relaxers -DG lumbar spine unremarkable -During hospitalization patient has been treated for pansensitive E. coli UTI as well as Salmonella UTI both of which have been sensitive to  Levaquin -Completed Levaquin 12/20-urine culture obtained after antibiotics initiated and showed no growth -CT abdomen and pelvis on 12/17 unremarkable  Salmonella UTI -Completed amoxicillin  Abnormal urinalysis -Not consistent with UTI, was positive for moderate hemoglobin and greater than 50 RBCs with an elevated specific gravity -Suspect dehydration -Patient denies back pain or history of renal calculi although given her confusion unclear if this history is accurate  Physical deconditioning -Resolved -Does continue to exhibit impulsivity but maintains appropriate balance and gait no longer requires one-to-one safety sitter  Large hemorrhoids. -Resolved -Markedly improved-LD Proctofoam 12/15  Dysphagia -Resolved -Continue regular diet   Data Reviewed: Basic Metabolic Panel: Recent Labs  Lab 04/21/20 0040 04/24/20 0707  NA 137 138  K 4.3 4.3  CL 101 98  CO2 27 26  GLUCOSE 83 113*  BUN 16 12  CREATININE 0.58 0.65  CALCIUM 8.9 9.2  MG  --  2.1  PHOS  --  4.5   Liver Function Tests: Recent Labs  Lab 04/21/20 0040 04/24/20 0707  AST 59* 80*  ALT 65* 78*  ALKPHOS 143* 163*  BILITOT 0.2* 0.7  PROT 6.0* 6.6  ALBUMIN 3.1* 3.7   No results for input(s): LIPASE, AMYLASE in the last 168 hours. No results for input(s): AMMONIA in the last 168 hours. CBC: Recent Labs  Lab 04/24/20 0707  WBC 5.3  NEUTROABS 3.0  HGB 13.8  HCT 38.9  MCV 94.4  PLT 343   Cardiac Enzymes: No results for input(s): CKTOTAL, CKMB, CKMBINDEX, TROPONINI in the last 168 hours. BNP (last 3 results) No results for input(s): BNP in the last 8760 hours.  ProBNP (last 3 results) No results for input(s): PROBNP in the last 8760 hours.  CBG: No results for input(s): GLUCAP in the last 168 hours.  No results found for this or any previous visit (from the past 240 hour(s)).   Studies: DG Abd 2 Views  Result Date: 04/27/2020 CLINICAL DATA:  Nausea, constipation. EXAM: ABDOMEN - 2  VIEW COMPARISON:  None. FINDINGS: Stool is seen throughout the colon. No small bowel dilatation. No unexpected radiopaque calculi. Minimal linear volume loss in the medial left lower lobe. Lung bases are otherwise clear. IMPRESSION: Stool throughout the colon is indicative of constipation. Electronically Signed   By: Lorin Picket M.D.   On: 04/27/2020 09:14    Scheduled Meds: . carbamazepine  600 mg Oral TID  . Chlorhexidine Gluconate Cloth  6 each Topical Daily  . clonazePAM  1.5 mg Oral Q6H  . cloNIDine  0.1 mg Transdermal Weekly  . diclofenac Sodium  4 g Topical QID  . escitalopram  20 mg Oral QHS  . feeding supplement  237 mL Oral TID BM  . Gerhardt's butt cream  1 application Topical TID  . hydrOXYzine  50 mg Intramuscular Once  . melatonin  5 mg Oral QHS  . mirtazapine  30 mg Oral QHS  . multivitamin with minerals  1 tablet Oral Daily  . naltrexone  50 mg Oral Daily  . polyethylene glycol  17 g Oral Daily  . vitamin B-6  100 mg Oral Daily  . thiamine  100 mg Oral Daily  . ziprasidone  80 mg Oral 2 times per day   Continuous Infusions: . sodium chloride      Principal Problem:   Delirium due to multiple etiologies Active Problems:   Acute metabolic encephalopathy   Hypokalemia   Polysubstance abuse (HCC)   Elevated CK   Transaminitis   Refractory seizure (HCC)   Chronic hepatitis C without hepatic coma (HCC)   Distended abdomen   Palliative care encounter   Colonic Ileus ([place])   Organic brain syndrome   On enteral nutrition   Physical deconditioning   Protein-calorie malnutrition ([place])   Inadequate oral nutritional intake   Impulse disorder   Consultants:  Neurology  Psychiatry  Interventional radiology  Surgery    Procedures:  9/14 lumbar puncture  9/15 EEG  9/16 EEG  9/17 EEG  9/19 overnight EEG with video  9/22 overnight EEG with video with discontinuation of long-term EEG monitoring on 9/25  9/24 core track placement  10/6  EEG  12/15 PEG tube removed    Antibiotics: Anti-infectives (From admission, onward)   Start     Dose/Rate Route Frequency Ordered Stop   04/05/20 1415  levofloxacin (LEVAQUIN) tablet 500 mg        500 mg Oral Daily 04/05/20  1327 04/11/20 0911   03/19/20 1000  metroNIDAZOLE (FLAGYL) tablet 500 mg  Status:  Discontinued        500 mg Per Tube 2 times daily 03/19/20 0822 03/23/20 0827   03/18/20 1200  amoxicillin (AMOXIL) 250 MG/5ML suspension 500 mg  Status:  Discontinued        500 mg Per Tube Every 8 hours 03/18/20 0915 03/23/20 0559   03/17/20 1200  cefTRIAXone (ROCEPHIN) 1 g in sodium chloride 0.9 % 100 mL IVPB  Status:  Discontinued        1 g 200 mL/hr over 30 Minutes Intravenous Every 24 hours 03/17/20 1048 03/18/20 0913   03/16/20 1130  metroNIDAZOLE (FLAGYL) 50 mg/ml oral suspension 500 mg  Status:  Discontinued        500 mg Per Tube 2 times daily 03/16/20 1040 03/19/20 0821   03/16/20 1130  fluconazole (DIFLUCAN) 40 MG/ML suspension 152 mg        150 mg Per Tube  Once 03/16/20 1040 03/16/20 1245   02/11/20 1526  ceFAZolin (ANCEF) IVPB 2g/100 mL premix        2 g 200 mL/hr over 30 Minutes Intravenous  Once 02/11/20 1526 02/11/20 1641   02/05/20 0600  ceFAZolin (ANCEF) IVPB 2g/100 mL premix        2 g 200 mL/hr over 30 Minutes Intravenous To Short Stay 02/04/20 1054 02/05/20 0950   01/29/20 0600  ceFAZolin (ANCEF) IVPB 2g/100 mL premix        2 g 200 mL/hr over 30 Minutes Intravenous On call to O.R. 01/28/20 1511 01/30/20 0559   01/28/20 2000  cefTRIAXone (ROCEPHIN) 1 g in sodium chloride 0.9 % 100 mL IVPB        1 g 200 mL/hr over 30 Minutes Intravenous Every 24 hours 01/28/20 1925 02/02/20 0724   01/06/20 0900  cefTRIAXone (ROCEPHIN) 2 g in sodium chloride 0.9 % 100 mL IVPB  Status:  Discontinued        2 g 200 mL/hr over 30 Minutes Intravenous Every 12 hours 01/06/20 0105 01/06/20 2202   01/06/20 0800  vancomycin (VANCOCIN) IVPB 1000 mg/200 mL premix  Status:   Discontinued       "Followed by" Linked Group Details   1,000 mg 200 mL/hr over 60 Minutes Intravenous Every 12 hours 01/05/20 1904 01/06/20 2202   01/05/20 2000  vancomycin (VANCOREADY) IVPB 1500 mg/300 mL       "Followed by" Linked Group Details   1,500 mg 150 mL/hr over 120 Minutes Intravenous  Once 01/05/20 1904 01/06/20 0127   01/05/20 1830  cefTRIAXone (ROCEPHIN) 2 g in sodium chloride 0.9 % 100 mL IVPB        2 g 200 mL/hr over 30 Minutes Intravenous  Once 01/05/20 1829 01/05/20 2220       Time spent: 25 minutes    [name] ANP  [facility] 7 am - 330 pm/M-F 112 days

## 2020-04-27 NOTE — Consult Note (Signed)
[facility] Face-to-Face Psychiatry Consult   Reason for Consult: Significant behavioral disturbances, bizarre behavior, agitation Referring Physician:  Dr.Pahwani Patient Identification: Anita Davis MRN:  [id number] Principal Diagnosis: Delirium due to multiple etiologies Diagnosis:  Principal Problem:   Delirium due to multiple etiologies Active Problems:   Acute metabolic encephalopathy   Hypokalemia   Polysubstance abuse (HCC)   Elevated CK   Transaminitis   Refractory seizure (HCC)   Chronic hepatitis C without hepatic coma (HCC)   Distended abdomen   Palliative care encounter   Colonic Ileus (HCC)   Organic brain syndrome   On enteral nutrition   Physical deconditioning   Protein-calorie malnutrition (HCC)   Inadequate oral nutritional intake   Impulse disorder   Total Time spent with patient: 45 minutes  Subjective:   Anita Davis is a 35 y.o. female patient with a history of polysubstance use disorder, who presented to [facility] in Sep with encephalopathy. EEG showed status epilepticus arising from the left central region. She was treated with Keppra, Vimpat and Dilantin and her mental status reportedly improved. She was transferred to [facility] for further work up. Per chart review, her mental status has fluctuated and has been complicated by her extended length of stay.  Mental status continues to fluctuate throughout the day to include having eyes open, nonverbal, smiling, engaging in conversation, then becoming aggressive requiring physical restraint and assistance from law enforcement to control behaviors.  She continues to receive multiple chemical restraints for her safety and the safety of other staff within the hospital.  Psychiatry is again consulted for significant behavioral disturbances, bizarre behavior, agitation.   HP/synopsis: Please see full note from [name] nurse practitioner, and has also been following this patient throughout her duration of  stay of 115 days.  Since admission patient has had varying levels of awakeness and orientation as well as labile mood and affect.  Essentially for the first 8 weeks of hospitalization patient would have brief episodes of being awake, sometimes oriented sometimes not.  These episodes would last for several minutes to several hours but were never sustained.  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  Multiple psychotropic medications were tried without success or caused significant side effects and were discontinued.  Decision made to taper and wean all psychotropic drugs.  After this was done patient became extremely alert and agitated.  She was very impulsive and was demonstrating hypersexual behavior as well as extreme mood swings.  Seroquel tried initially to manage brain injury sequela but required high dosages unfortunately contributing to patient's depression and suicidal ideation.  It was noted that each time patient was given IM Geodon that she became alert and for the most part oriented, was able to consistently communicate and perform ADLs.  Eventually she was transitioned to oral Geodon.  Naltrexone had been started to assist with underlying hypersexual behaviors and impulsivity and to treat her underlying heroin addiction since she had not had any methadone or heroin since admission.  She has also been placed on Klonopin and clonidine to aid with her severe anxiety.  Recently patient has had issues with extreme agitation around the holidays and was acting out with wandering behaviors, spitting on staff, using profanity in the hallways and fighting with security staff when restraints were applied.  Some of her behaviors have been manipulative especially in regards to attempting to obtain drugs such as benzodiazepines or narcotics.  Neurology was reconsulted.  Repeat MRI unremarkable  and it is felt current behaviors are combination of brain injury sequela  as well as underlying psychiatric issues from chronic drug abuse.  Recently patient has become focused on obtaining methadone.  Typically when she becomes anxious or restless she request some sort of drug: " I need a hot shot" (Ambien), " I need something for my anxiety I cannot take it anymore"; and most recently she is focused on obtaining methadone.  She remains confused and alternates between sad and depressed or angry and defiant.  The patient was observed to be in the bed in a deep sleep.  She continues to have a one-on-one sitter for her safety, as she continues to have periods of alertness, followed by some catatonia, followed by aggression and combativeness.  As noted in chart patient has had multiple failed attempts at multiple psychotropic medication to include first and second generation antipsychotic, multiple mood stabilizers, and several trials of benzodiazepines.  There have been multiple attempts to wean her off of the benzodiazepines, however patient has notable agitation and aggression and is a concern for withdrawal syndrome.  At the time of this evaluation patient is currently taking it carbamazepine 600 mg p.o. 3 times daily, clonazepam 1.5 mg p.o. every 6 hours, Escitalopram 20 mg p.o. nightly, mirtazapine 30 mg p.o. nightly, and ziprasidone 80 mg p.o. twice daily.   Past Psychiatric History: Please see initial evaluation for full details. I have reviewed the history. No updates at this time.    Risk to Self:  yes Risk to Others:  yes Prior Inpatient Therapy:  see initial eval. No update Prior Outpatient Therapy:  see initial eval. No update  Past Medical History:  Past Medical History:  Diagnosis Date  . Allergy   . Bronchitis   . Hepatitis-C   . History of gestational diabetes   . HPV in female   . Substance abuse [facility])     Past Surgical History:  Procedure Laterality Date  . HEMORRHOID BANDING  age 29  . IR GASTROSTOMY TUBE REMOVAL  04/06/2020  . IR REPLC  GASTRO/COLONIC TUBE PERCUT W/FLUORO  02/11/2020  . LAPAROSCOPIC GASTROSTOMY N/A 02/05/2020   Procedure: LAPAROSCOPIC GASTROSTOMY PLACEMENT;  Surgeon: Gaynelle Adu, MD;  Location: [facility] OR;  Service: General;  Laterality: N/A;   Family History:  Family History  Problem Relation Age of Onset  . Depression Mother   . Cervical cancer Mother   . Hypertension Father   . Diabetes Father    Family Psychiatric  History: Please see initial evaluation for full details. I have reviewed the history. No updates at this time.    Social History:  Social History   Substance and Sexual Activity  Alcohol Use No     Social History   Substance and Sexual Activity  Drug Use Not Currently  . Types: IV, Cocaine, Hydrocodone, Oxycodone, Marijuana, Heroin   Comment: takes methadone- now taking subutex (03/27/19)    Social History   Socioeconomic History  . Marital status: Single    Spouse name: Not on file  . Number of children: Not on file  . Years of education: Not on file  . Highest education level: Not on file  Occupational History  . Not on file  Tobacco Use  . Smoking status: Current Every Day Smoker    Years: 24.00    Types: Cigarettes, Cigars  . Smokeless tobacco: Never Used  . Tobacco comment: 1 cigar/daily. former 1 pack cigarette smoker quit 05/2019  currently smokes CBD  Vaping Use  .  Vaping Use: Every day  . Substances: Nicotine, Flavoring  Substance and Sexual Activity  . Alcohol use: No  . Drug use: Not Currently    Types: IV, Cocaine, Hydrocodone, Oxycodone, Marijuana, Heroin    Comment: takes methadone- now taking subutex (03/27/19)  . Sexual activity: Not Currently    Birth control/protection: Abstinence  Other Topics Concern  . Not on file  Social History Narrative  . Not on file   Social Determinants of Health   Financial Resource Strain: Not on file  Food Insecurity: Not on file  Transportation Needs: Not on file  Physical Activity: Not on file  Stress: Not on  file  Social Connections: Not on file   Additional Social History:    Allergies:  No Known Allergies  Labs: No results found for this or any previous visit (from the past 48 hour(s)).  Current Facility-Administered Medications  Medication Dose Route Frequency Provider Last Rate Last Admin  . acetaminophen (TYLENOL) tablet 650 mg  650 mg Oral Q6H PRN Russella Dar, NP   650 mg at 04/21/20 0542  . benzocaine (ORAJEL) 10 % mucosal gel   Mouth/Throat QID PRN Marikay Alar, FNP   Given at 04/23/20 0259  . butalbital-acetaminophen-caffeine (FIORICET) 50-325-40 MG per tablet 1 tablet  1 tablet Oral Q6H PRN Russella Dar, NP   1 tablet at 04/24/20 0353  . carbamazepine (TEGRETOL) tablet 600 mg  600 mg Oral TID Russella Dar, NP   600 mg at 04/27/20 1018  . Chlorhexidine Gluconate Cloth 2 % PADS 6 each  6 each Topical Daily Gaynelle Adu, MD   6 each at 04/22/20 1116  . clonazePAM (KLONOPIN) tablet 1.5 mg  1.5 mg Oral Q6H [name] L, NP   1.5 mg at 04/27/20 1019  . cloNIDine (CATAPRES - Dosed in mg/24 hr) patch 0.1 mg  0.1 mg Transdermal Weekly Alwyn Ren, MD   0.1 mg at 04/16/20 1930  . diclofenac Sodium (VOLTAREN) 1 % topical gel 4 g  4 g Topical QID Russella Dar, NP   4 g at 04/22/20 1120  . docusate sodium (COLACE) capsule 100 mg  100 mg Oral BID Russella Dar, NP      . escitalopram (LEXAPRO) tablet 20 mg  20 mg Oral QHS Russella Dar, NP   20 mg at 04/26/20 2108  . feeding supplement (ENSURE ENLIVE / ENSURE PLUS) liquid 237 mL  237 mL Oral TID BM Kathlen Mody, MD   237 mL at 04/26/20 2109  . Gerhardt's butt cream 1 application  1 application Topical TID Narda Bonds, MD   1 application at 04/21/20 1718  . Gerhardt's butt cream   Topical PRN Narda Bonds, MD   Given at 03/07/20 1540  . hydrOXYzine (VISTARIL) injection 50 mg  50 mg Intramuscular Once Russella Dar, NP      . ibuprofen (ADVIL) tablet 600 mg  600 mg Oral Q6H PRN Russella Dar, NP    600 mg at 04/26/20 2118  . magnesium hydroxide (MILK OF MAGNESIA) suspension 30 mL  30 mL Oral Once Russella Dar, NP      . melatonin tablet 5 mg  5 mg Oral QHS Russella Dar, NP   5 mg at 04/26/20 2109  . mirtazapine (REMERON) tablet 30 mg  30 mg Oral QHS Russella Dar, NP   30 mg at 04/26/20 2109  . multivitamin with minerals tablet 1 tablet  1 tablet Oral  Daily Russella Dar, NP   1 tablet at 04/27/20 1019  . naltrexone (DEPADE) tablet 50 mg  50 mg Oral Daily Russella Dar, NP   50 mg at 04/27/20 1019  . polyethylene glycol (MIRALAX / GLYCOLAX) packet 17 g  17 g Oral BID Russella Dar, NP      . pyridOXINE (VITAMIN B-6) tablet 100 mg  100 mg Oral Daily Russella Dar, NP   100 mg at 04/27/20 1019  . sodium chloride 0.9 % bolus 500 mL  500 mL Intravenous Once Kathlen Mody, MD      . thiamine tablet 100 mg  100 mg Oral Daily Russella Dar, NP   100 mg at 04/27/20 1019  . ziprasidone (GEODON) capsule 80 mg  80 mg Oral 2 times per day Sheikh, Omair Latif, DO   80 mg at 04/27/20 1019  . ziprasidone (GEODON) injection 20 mg  20 mg Intramuscular Q8H PRN Russella Dar, NP   20 mg at 04/26/20 1701    Musculoskeletal: Strength & Muscle Tone: N/A Gait & Station: normal Patient leans: N/A  Psychiatric Specialty Exam: Physical Exam Vitals and nursing note reviewed.     Review of Systems  Unable to perform ROS: Other    Blood pressure 107/71, pulse 71, temperature 97.9 F (36.6 C), temperature source Oral, resp. rate 15, height 5\' 5"  (1.651 m), weight 71.6 kg, SpO2 98 %.Body mass index is 26.27 kg/m.  General Appearance: Fairly Groomed  Eye Contact:  Unable to assess patient is sleep  Speech:  Unable to assess  Volume:  Unable to assess,  Mood:  Unable to assess  Affect:  Unable to assess  Thought Process:  NA,   Orientation:  Other:  Unable to assess  Thought Content:  Unable to assess  Suicidal Thoughts:  Unable to assess  Homicidal Thoughts:  Unable to  assess  Memory:  Unable to assess  Judgement:  Other:  Unable to assess  Insight:  Unable to assess  Psychomotor Activity:  Unable to assess  Concentration:  Unable to assess  Recall:  Unable to assess  Fund of Knowledge:  Unable to assess  Language:  Unable to assess  Akathisia:  Unable to assess  Handed:  Right  AIMS (if indicated):     Assets:  Others:  Unable to assess  ADL's:  Unable to assess  Cognition:  Unable to assess  Sleep:       Assessment Emilynn Srinivasan is a 35 y.o. year old female with a history of polysubstance use disorder, who presented to [facility] hospital in Sep 2021 with encephalopathy. EEG showed status epilepticus arising from the left central region. She was treated with Keppra, Vimpat and Dilantin and her mental status reportedly improved. She was transferred to [facility] for further work up. Per chart review, her mental status has fluctuated.  Her mental status has fluctuated from having her eyes open,.  She has become very pleased and not following commands, to answering questions normally within the same day.    # Altered mental status # r/o delirium due to multiple etiologies # r/o post ictal psychosis Substance-induced mood disorder  Patient is asleep during the evaluation, due to her ongoing disruptiveness, combativeness, and violent outbursts patient was about to continue to rest without awakening.  However patient has been very hostile, labile, rapid cycling, aggressive, and agitated.in addition to her verbal threats she has become physically aggressive with staff requiring the presence of security in  law enforcement, which she remains under involuntary commitment at this time.  I agree that patient needs to continue under IVC to ensure her safety with adequate safety precaution.  Patient has maximized multiple psychotropic medication with little to some improvement, however has plateaued at this point and appears to be treatment resistant.  Patient  continues to require higher level of care to ensure her overall safety with goals to restore and recover her previous mental state.  Although this has been quite a feat due to extended length of stay, seizure disorder, polysubstance abuse, and altered mental status.  There was also some concern regarding methamphetamine induced psychosis, however urine drug screen on admission was negative for methamphetamine and patient also denies methamphetamine use prior to this hospital admission.  With original acute onset of psychosis suspect patient may have used synthetic form of an illicit substance that resulted in her altered mental status.    Treatment Plan Summary: Plan as below - Continue IVC due to high risk of self harm and others.  -Continue safety precaution and sitter for unpredicted behavior - Continue Geodon 80 mg BID  -Patient has not required IM Geodon in 48 hours. - Monitor EKG to rule out QTc prolongation. Would advise discontinuation of Geodon if QTc>500 msec -Will discontinue Lexapro 20 mg and initiate citalopram 20 mg p.o. daily for delirium and management of behavioral disturbances. - Continue naltrexone 50 mg daily  - Continue melatonin  - Consider tapering off or adjust dosing Klonopin if medically appropriate.  - Continue to monitor and treat underlying medical causes of delirium, including infection, electrolyte disturbances, etc.  Discussed the above recommendation with Erin Hearing. We have maximized all treatment options at this time, with some improvement initially however she has not progressed in the past month. Will recommend higher level of care at a state psychiatric hospital for long term management of chronic psychiatric condition. [facility] also offers medical unit which can continue to assist patient in her overall recovery of mental illness and medical health.   Disposition: Recommend inpatient admission at state psychiatric hospital. Please coordinate referral with SW to  faciliate admisison to [facility]. Patient appears to be treatment resistment, and all services have been maximized.     Suella Broad, FNP 04/27/2020 2:32 PM

## 2020-04-28 LAB — COMPREHENSIVE METABOLIC PANEL
ALT: 73 U/L — ABNORMAL HIGH (ref 0–44)
AST: 66 U/L — ABNORMAL HIGH (ref 15–41)
Albumin: 3.4 g/dL — ABNORMAL LOW (ref 3.5–5.0)
Alkaline Phosphatase: 134 U/L — ABNORMAL HIGH (ref 38–126)
Anion gap: 10 (ref 5–15)
BUN: 13 mg/dL (ref 6–20)
CO2: 24 mmol/L (ref 22–32)
Calcium: 8.2 mg/dL — ABNORMAL LOW (ref 8.9–10.3)
Chloride: 99 mmol/L (ref 98–111)
Creatinine, Ser: 0.66 mg/dL (ref 0.44–1.00)
GFR, Estimated: >60 mL/min (ref >60)
Glucose, Bld: 78 mg/dL (ref 70–99)
Potassium: 4 mmol/L (ref 3.5–5.1)
Sodium: 133 mmol/L — ABNORMAL LOW (ref 135–145)
Total Bilirubin: 0.5 mg/dL (ref 0.3–1.2)
Total Protein: 6.1 g/dL — ABNORMAL LOW (ref 6.5–8.1)

## 2020-04-28 MED ORDER — STERILE WATER FOR INJECTION IJ SOLN
INTRAMUSCULAR | Status: AC
  Administered 2020-04-28: 1.2 mL
  Filled 2020-04-28: qty 10

## 2020-04-28 MED ORDER — CITALOPRAM HYDROBROMIDE 20 MG PO TABS
20.0000 mg | ORAL_TABLET | Freq: Every day | ORAL | Status: DC
  Administered 2020-04-28 – 2020-06-16 (×49): 20 mg via ORAL
  Filled 2020-04-28 (×50): qty 1

## 2020-04-28 NOTE — Progress Notes (Signed)
Occupational Therapy Treatment [name] MRN: [id number] DOB: 1985-06-17 Today's Date: 04/28/2020    History of present illness 35 year old with past medical history significant for hepatitis C, polysubstance abuse brought to the hospital 9/13 for disorientation which was suspected to be substance abuse.  After she became more lucid, she was discharged, but then police brought her back later in the day and they found her passed out in a parking lot.  Urine drug screen was positive for benzodiazepine and THC. An EEG done on 9/15 showed patient was in status epilepticus.  Patient was a started on Keppra.  Despite these, EEG noted continued seizures requiring additional medications as well.  Finally on 9/18, seizures broke. Follow-up EEG on 9/20 noted evidence of epilepticity from the left central temporal region.  Repeat EEG done on 9/22 noted epileptogenicity from the left central temporal region.  Pt with PEG placed on 02/05/20 removed on 12/15   OT comments  Pt making steady progress towards OT goals this session. Pt supine in bed upon OTA arrival, but agreeable to OT intervention. Pt was able to complete light meal prep task of making peanut butter crackers in room with supervision, pt able to sequence all steps and verbalize all needed supplies for task. Initiated education on completing daily schedule to give pt a sense of structure and control over her day although pt noted to become emotional during education and then no longer responding to OTA, left printed out scheduled in pt room and encouraged pt to work on writing in appropriate activities each hour. DC plan currently remains appropriate, will continue to follow acutely and update DC recs as needed.    Follow Up Recommendations  SNF    Equipment Recommendations  None recommended by OT    Recommendations for Other Services Other (comment) (continued psych (A))    Precautions / Restrictions  Precautions Precautions: Fall Precaution Comments: seizures Restrictions Weight Bearing Restrictions: No       Mobility Bed Mobility Overal bed mobility: Modified Independent Bed Mobility: Supine to Sit;Sit to Supine     Supine to sit: Modified independent (Device/Increase time) Sit to supine: Modified independent (Device/Increase time)   General bed mobility comments: no physcial assist needed, HOB elevated with increased time  Transfers Overall transfer level: Needs assistance Equipment used: None Transfers: Sit to/from Stand Sit to Stand: Supervision         General transfer comment: supervision for safety    Balance Overall balance assessment: Needs assistance Sitting-balance support: No upper extremity supported;Feet supported Sitting balance-Leahy Scale: Good     Standing balance support: No upper extremity supported;During functional activity Standing balance-Leahy Scale: Fair Standing balance comment: able to complete functional task at counter with no UE support or LOB                           ADL either performed or assessed with clinical judgement   ADL Overall ADL's : Needs assistance/impaired                         Toilet Transfer: Min guard;Ambulation Toilet Transfer Details (indicate cue type and reason): simualated via functional mobility in room         Functional mobility during ADLs: Min guard General ADL Comments: pt reports already completing wash up, session focus on pathfinding task and facilitating schedule     Vision       Perception  Praxis      Cognition Arousal/Alertness: Awake/alert Behavior During Therapy: Flat affect Overall Cognitive Status: Impaired/Different from baseline Area of Impairment: Following commands;Safety/judgement;Orientation;Attention;Memory;Awareness                 Orientation Level: Disoriented to;Time (unable to state date without looking at i pad) Current  Attention Level: Focused Memory: Decreased short-term memory (unable to recall previous OT session) Following Commands: Follows one step commands consistently;Follows multi-step commands inconsistently Safety/Judgement: Decreased awareness of safety;Decreased awareness of deficits (pt states "I'm dying" when Rn enters to give meds) Awareness: Intellectual Problem Solving: Difficulty sequencing;Decreased initiation;Slow processing;Requires verbal cues General Comments: pt very flat during session needing cues to initiate all tasks, pt able to sequence appropriate steps when completing task of making peanut butter crackers. pt becomes liable at end of session when RN enters stating, " I'm dying" and "you're not telling th truth" after emotional episode pt no longer able to attend to task        Exercises Other Exercises Other Exercises: encouraged pt to work on putting schedule for day together to give pt some structure and feel like pt has more control over her day   Shoulder Instructions       General Comments      Pertinent Vitals/ Pain       Pain Assessment: No/denies pain  Home Living                                          Prior Functioning/Environment              Frequency  Min 1X/week        Progress Toward Goals  OT Goals(current goals can now be found in the care plan section)  Progress towards OT goals: Progressing toward goals  Acute Rehab OT Goals Patient Stated Goal: wanting the purewick from sitter as OT leaving room OT Goal Formulation: With patient Time For Goal Achievement: 05/06/20 Potential to Achieve Goals: Good  Plan Discharge plan remains appropriate;Frequency remains appropriate    Co-evaluation                 AM-PAC OT "6 Clicks" Daily Activity     Outcome Measure   Help from another person eating meals?: None Help from another person taking care of personal grooming?: A Little Help from another person  toileting, which includes using toliet, bedpan, or urinal?: A Little Help from another person bathing (including washing, rinsing, drying)?: A Little Help from another person to put on and taking off regular upper body clothing?: A Little Help from another person to put on and taking off regular lower body clothing?: A Little 6 Click Score: 19    End of Session    OT Visit Diagnosis: Unsteadiness on feet (R26.81);Muscle weakness (generalized) (M62.81);Pain;Other symptoms and signs involving the nervous system (R29.898);Other symptoms and signs involving cognitive function;Cognitive communication deficit (R41.841);Ataxia, unspecified (R27.0);Feeding difficulties (R63.3);Other abnormalities of gait and mobility (R26.89)   Activity Tolerance Other (comment) (pt tolerating treament well until emotional episode then pt no longer engaging with OTA)   Patient Left in bed;with call bell/phone within reach;with nursing/sitter in room   Nurse Communication Mobility status        Time: 9528-4132 OT Time Calculation (min): 31 min  Charges: OT General Charges $OT Visit: 1 Visit OT Treatments $Therapeutic Activity: 23-37 mins  Pollyann Glen K., COTA/L  Acute Rehabilitation Services [place] Tranika Scholler 04/28/2020, 10:34 AM

## 2020-04-28 NOTE — Progress Notes (Signed)
[facility] PROGRESS NOTE  Anita Davis GGY:[id number] DOB: April 17, 1986 DOA: 01/04/2020 PCP: [name], PA-C    02/10/20                      02/15/20                       04/08/20   Status: Remains inpatient appropriate because:Altered mental status, Unsafe d/c plan and Inpatient level of care appropriate due to severity of illness   Dispo: The patient is from: Home              Anticipated d/c is to: SNF              Anticipated d/c date is: > 3 days              Patient currently is medically stable to d/c.  Barriers to discharge: Recent worsening of underlying psychiatric condition requiring short-term IVC in restraints.  IVC removed as of 12/30 and patient has not required restraints in 72 hours.  Also likely needs SNF due to behavioral issues.  It is hopeful at some point patient behavioral stabilize to where family can manage her at home    Code Status: DNR Family Communication: Stepmother [name] 12/29 DVT prophylaxis: Consistently ambulatory so no DVT prophylaxis indicated Vaccination status: Fully vaccinated against Covid with second vaccine given on 02/23/2020  Pertinent social history Patient has 3 children: Anita Davis and Anita Davis's father has primary custody and no one in the family knows where they reside With permission from patient's father and stepmother I contacted Anita Davis (telephone number [phone]) who has legal custody of Anita Davis and Anita Davis; at this juncture Mrs. Anita Davis states that after extensive discussions with Anita Davis he no longer wishes to visit his mother in person.  Anita Davis is only 5 and it is not appropriate for him to visit his mother at this point.  Prior to the patient's acute illness Mrs. Anita Davis states that [name] had regular frequent supervised visits with these children.  The possibility of children recording video greetings to their mother has been discussed and she will need to discuss this with her husband before agreeing. Mrs.  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  She presented to [facility] ER 3 times prior to admission.  On her 4th presentation she was admitted.  On 9/2 she was diagnosed with acute cystitis with hematuria and was discharged home on Keflex and Zofran.  9/9 she presented with anxiety and symptoms consistent with a panic attack.  She was discharged from the ER with prescription for Vistaril.  She again presented to the ER on 9/10  reporting significant anxiety and severe insomnia and stating the Vistaril was not working.  She was given Ativan while in the ER and this appeared to improve her symptoms.  She was stable for discharge and instructed to take Benadryl at bedtime to assist with her insomnia.  On 9/13 patient brought to the ER by family.  She  was refusing to answer questions.  Clinician documented sores/lesions on her legs consistent with methamphetamine use.  When questioned regarding this patient reportedly just stared at the clinician.  It was later determined that the patient had been abusing substances (methamphetamine).  Prior to discharge patient appeared to be hallucinating but was able to verbally communicate.  Over the next hour she became more agitated and was given Ativan 2 mg orally. She later returned to her baseline mentation stating she did not wish to hurt herself or anybody else and requested to be discharged home. She was referred to [facility] and discharged by EDP.  Later that same day  patient was returned to the ER by the police.  Patient was found laying down in a parking lot and acting bizarre.  She apparently told the police that she had put methamphetamines in her pants.  Patient kept reaching for vagina and it was suspected she may have placed drugs intravaginally.  No drugs were ever found.  EEG and MRI were completed with no acute abnormalities; urine drug screen positive for THC as well as Ativan (which had been given in the ED).  She underwent a lumbar puncture which was negative and was empirically started on antibiotics to treat potential infectious etiology to her encephalopathy.  After admission this patient developed seizure activity which progressed to status epilepticus therefore she was transferred to [facility] for further neurological evaluation.  After several days recurrent seizures were aborted with combination of 3 antiepileptic drugs.  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  Unfortunately she did not improve.  Because of persistent altered mentation and inability to sustain appropriate oral intake a PEG tube was placed.  This tube was eventually discontinued after patient became alert and awake enough to eat consistently during the latter portion of the hospitalization.  Since admission patient has had varying levels of awakeness and orientation as well as labile mood and affect.  Essentially for the first 8 weeks of hospitalization patient would have brief episodes of being awake, sometimes oriented sometimes not.  These episodes would last for several minutes to several hours but were never sustained.  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  Multiple psychotropic medications were tried without success or caused significant side effects and were discontinued.  Decision made to taper and wean all psychotropic  drugs.  After this was done patient became extremely alert and agitated.  She was very impulsive and was demonstrating hypersexual behavior as well as extreme mood swings.  Seroquel tried initially to manage brain injury sequela but required high dosages unfortunately contributing to patient's depression and suicidal ideation.  It was noted that each time patient was given IM Geodon that she became alert and for the most part oriented, was able to consistently communicate and perform ADLs.  Eventually she was transitioned to oral Geodon.  Naltrexone had been started to assist with underlying hypersexual behaviors and impulsivity and to treat her underlying heroin addiction since she had not had any methadone or heroin since admission.  She has also been placed on Klonopin and clonidine to aid with her severe anxiety.  Recently patient has had issues with extreme agitation around the holidays and was acting out with wandering behaviors, spitting on staff, using profanity in the hallways and fighting with security staff when restraints were applied.  Some of her behaviors have been manipulative  especially in regards to attempting to obtain drugs such as benzodiazepines or narcotics.  Neurology was reconsulted.  Repeat MRI unremarkable and it is felt current behaviors are combination of brain injury sequela as well as underlying psychiatric issues from chronic drug abuse.  Recently patient has become focused on obtaining methadone.  Typically when she becomes anxious or restless she request some sort of drug: " I need a hot shot" (Ambien), " I need something for my anxiety I cannot take it anymore"; and most recently she is focused on obtaining methadone.  She remains confused and alternates between sad and depressed or angry and defiant.  Subjective: Earlier this morning upon initial evaluation of patient she was alert and awake sitting on bed with legs crossed drawing.  She was oriented except not to year.  She  knew she was in the hospital and knew why she was in the hospital.  Her affect was very flat and her responses were very terse.  Discussed different things regarding possible discharge if her behavior remains consistent in regards to wakefulness and no further significant issues with violent outbursts.  Patient nodded in agreement.  Discussed if she had home she was living at prior to admission and she said yes.  Discussed having family obtain her belongings.  She reported she called her aunt Joellen [place] to have her get the belongings.  I informed the patient that at this time Joellen [place] is not on her allowed visitors list.  I had remained on the unit to see other patients and when I returned the tech asked me to come look at the patient because she thought she might be seizing.  She was not seizing but was having another one of her episodes where she becomes verbally unresponsive with eyes open.  Extremities were supple and no tremors observed.  No hypertonicity with PROM.  Later in day patient again became more alert and participated somewhat with OT but became less responsive and more emotional during educational sessions.  Objective: Vitals:   04/27/20 1408 04/28/20 0607  BP: 107/71 120/86  Pulse: 71 74  Resp: 15 14  Temp: 97.9 F (36.6 C) (!) 97.4 F (36.3 C)  SpO2: 98% 99%    Intake/Output Summary (Last 24 hours) at 04/28/2020 1426 Last data filed at 04/28/2020 1300 Gross per 24 hour  Intake 390 ml  Output 0 ml  Net 390 ml   Filed Weights   03/29/20 0500 04/04/20 0632 04/05/20 0613  Weight: 72.5 kg 70.9 kg 71.6 kg    Exam: General: Awake, short terse responses but overall is calm Pulmonary:  Lung sounds clear to auscultation, room air saturation stable Cardiac:  S1-S2, pulse regular, no peripheral edema Abdomen: Soft, nondistended.  Nontender.  Bowel sounds present.   Take ranging between 50% and 100% LBM 1/02 Neurological: Moves all extremities x4, no focal neurological deficits, no tremors  or rigidity/EPS sx's Psychiatric: Awake, calm, oriented x3.  Answers questions appropriately.     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  On 1/4 patient denies preadmission methamphetamine use (this is contradictory to additional history obtained both from the family and from the medical record) -Continue Geodon 80 mg BID (dose last increased on 1/3). -  Continue naltrexone for impulsivity and hypersexuality -Continue one-to-one safety sitter and restraints as needed for aggressive behavior -In regards to her bizarre behavior she was exhibiting these same symptoms on date of admission and 24 hours previous to admission -Remeron 30 mg HS added on 1/3 and the following morning patient awakened with normal behavior patterns and improved orientation the a.m. of 1/4 although by late afternoon early evening patient had an extreme change in behavior with aggression and anger as well as severe confusion requiring reinstatement of IVC. - Formal psychiatric reevaluation completed.  It appears we have maxed out on all psychotropic medications for this patient.  It is noted that many of these medications will take weeks to months for full therapeutic benefit.  Given her persistent severe behavioral swings that include violent episodes of physically attacking staff she is not a candidate for an SNF and needs more aggressive therapies.  [facility]iatry [facility]  [facility] aware and is beginning this process.  I will contact patient's family on 1/7 to update them on this  plan.   Status epilepticus -See above regarding methamphetamine use prior to admission -Repeat MRI on 12/28 within normal limits and it is felt behavioral issues related to brain injury sequelae and chronic psychiatric condition.  Neurology has signed off -Vimpat, Dilantin and phenobarbital have been tapered and discontinued at recommendation of neurologist -Discussed with neurologist/Yadav today regarding clarification of preadmission history and excessive methamphetamine use as likely rationale for status epilepticus and she agreed.  History of polysubstance abuse with heroin and methamphetamine -Prescribed methadone 60 mg daily from a clinic in [place]  -Has not required methadone since arrival therefore we will continue naltrexone to treat heroin addiction.  This is the last drug clinicians utilize to maintain stability after patients have been successful with methadone.  Would not return to methadone at this juncture.    Depression and severe anxiety disorder/PTSD/Suspected undiagnosed bipolar disorder -Patient continues to demonstrate extremes in behavior she is either calm and mostly appropriate although somewhat confused, extremely withdrawn sad and crying, or extremely agitated and violent and has required IVC status x2 this admission -Continue Klonopin to 1.5 mg daily 6hrs  -Continue Tegretol 600 mg TID (max dose)-check LFTs on 1/6 -She was reevaluated by psychiatry who recommended transition from Tegretol to Depakote if LFTs increase in the context of her underlying hepatitis C -Vistaril discontinued due to drug to drug interaction with Geodon -Lexapro initiated on 12/24 and dose maximized on 12/27 to 20 mg HS -Had had several days of less significant agitation or aggressive behavior but 48 hours ago had significant violent outburst requiring pharmacotherapy and emergency resumption of IVC.  Recurrent constipation -Last bowel movement 12/27 and patient now with nausea,  questionable abdominal pain and poor intake -Review abdominal films today on 1/5 revealed pancolonic stool -We will give milk of magnesia x1 today -Begin Colace 100 mg twice daily x3 days and if has started having bowel movements changed to prn -Increase scheduled MiraLAX to twice daily     Other problems: Nonobstructive transaminitis in context of hepatitis C -Elevated HCV antibody with markedly elevated HCV RNA quantitative level  consistent with chronic hepatitis C  -LFTs remain stable without an obstructive pattern noted -11/10: Discussed with ID Dr. Manson Passey. Treatment of hepatitis C typically is initiated in the outpatient setting. Medications can be quite expensive and usually take time to obtain insurance approval.  -ID recommends scheduling outpatient appointment their clinic closer to discharge date  Back pain 2/2 abnormal urinalysis consistent with UTI -Patient with low-grade fever overnight and is now having back pain for days not responsive to NSAIDs and muscle relaxers -DG lumbar spine unremarkable -During hospitalization patient has been treated for pansensitive E. coli UTI as well as Salmonella UTI both of which have been sensitive to Levaquin -Completed Levaquin 12/20-urine culture obtained after antibiotics initiated and showed no growth -CT abdomen and pelvis on 12/17 unremarkable  Salmonella UTI -Completed amoxicillin  Abnormal urinalysis -Not consistent with UTI, was positive for moderate hemoglobin and greater than 50 RBCs with an elevated specific gravity -Suspect dehydration -Patient denies back pain or history of renal calculi although given her confusion unclear if this history is accurate  Physical deconditioning -Resolved -Does continue to exhibit impulsivity but maintains appropriate balance and gait no longer requires one-to-one safety sitter  Large hemorrhoids. -Resolved -Markedly improved-LD Proctofoam  12/15  Dysphagia -Resolved -Continue regular diet   Data Reviewed: Basic Metabolic Panel: Recent Labs  Lab 04/24/20 0707 04/28/20 0101  NA 138 133*  K 4.3 4.0  CL 98 99  CO2 26 24  GLUCOSE 113* 78  BUN 12 13  CREATININE 0.65 0.66  CALCIUM 9.2 8.2*  MG 2.1  --   PHOS 4.5  --    Liver Function Tests: Recent Labs  Lab 04/24/20 0707 04/28/20 0101  AST 80* 66*  ALT 78* 73*  ALKPHOS 163* 134*  BILITOT 0.7 0.5  PROT 6.6 6.1*  ALBUMIN 3.7 3.4*   No results for input(s): LIPASE, AMYLASE in the last 168 hours. No results for input(s): AMMONIA in the last 168 hours. CBC: Recent Labs  Lab 04/24/20 0707  WBC 5.3  NEUTROABS 3.0  HGB 13.8  HCT 38.9  MCV 94.4  PLT 343   Cardiac Enzymes: No results for input(s): CKTOTAL, CKMB, CKMBINDEX, TROPONINI in the last 168 hours. BNP (last 3 results) No results for input(s): BNP in the last 8760 hours.  ProBNP (last 3 results) No results for input(s): PROBNP in the last 8760 hours.  CBG: No results for input(s): GLUCAP in the last 168 hours.  No results found for this or any previous visit (from the past 240 hour(s)).   Studies: DG Abd 2 Views  Result Date: 04/27/2020 CLINICAL DATA:  Nausea, constipation. EXAM: ABDOMEN - 2 VIEW COMPARISON:  None. FINDINGS: Stool is seen throughout the colon. No small bowel dilatation. No unexpected radiopaque calculi. Minimal linear volume loss in the medial left lower lobe. Lung bases are otherwise clear. IMPRESSION: Stool throughout the colon is indicative of constipation. Electronically Signed   By: Leanna Battles M.D.   On: 04/27/2020 09:14    Scheduled Meds: . carbamazepine  600 mg Oral TID  . Chlorhexidine Gluconate Cloth  6 each Topical Daily  . clonazePAM  1.5 mg Oral Q6H  . cloNIDine  0.1 mg Transdermal Weekly  . diclofenac Sodium  4 g Topical QID  . docusate sodium  100 mg Oral BID  . escitalopram  20 mg Oral QHS  . feeding supplement  237 mL Oral TID BM  . Gerhardt's butt  cream  1 application Topical TID  . hydrOXYzine  50 mg Intramuscular Once  . melatonin  5 mg Oral QHS  . mirtazapine  30 mg Oral QHS  . multivitamin with minerals  1 tablet Oral Daily  . naltrexone  50 mg Oral Daily  . polyethylene glycol  17 g Oral BID  . vitamin B-6  100 mg Oral Daily  .  thiamine  100 mg Oral Daily  . ziprasidone  80 mg Oral 2 times per day   Continuous Infusions: . sodium chloride      Principal Problem:   Delirium due to multiple etiologies Active Problems:   Acute metabolic encephalopathy   Hypokalemia   Polysubstance abuse (HCC)   Elevated CK   Transaminitis   Refractory seizure (HCC)   Chronic hepatitis C without hepatic coma (HCC)   Distended abdomen   Palliative care encounter   Colonic Ileus (HCC)   Organic brain syndrome   On enteral nutrition   Physical deconditioning   Protein-calorie malnutrition (HCC)   Inadequate oral nutritional intake   Impulse disorder   Consultants:  Neurology  Psychiatry  Interventional radiology  Surgery    Procedures:  9/14 lumbar puncture  9/15 EEG  9/16 EEG  9/17 EEG  9/19 overnight EEG with video  9/22 overnight EEG with video with discontinuation of long-term EEG monitoring on 9/25  9/24 core track placement  10/6 EEG  12/15 PEG tube removed    Antibiotics: Anti-infectives (From admission, onward)   Start     Dose/Rate Route Frequency Ordered Stop   04/05/20 1415  levofloxacin (LEVAQUIN) tablet 500 mg        500 mg Oral Daily 04/05/20 1327 04/11/20 0911   03/19/20 1000  metroNIDAZOLE (FLAGYL) tablet 500 mg  Status:  Discontinued        500 mg Per Tube 2 times daily 03/19/20 0822 03/23/20 0827   03/18/20 1200  amoxicillin (AMOXIL) 250 MG/5ML suspension 500 mg  Status:  Discontinued        500 mg Per Tube Every 8 hours 03/18/20 0915 03/23/20 0559   03/17/20 1200  cefTRIAXone (ROCEPHIN) 1 g in sodium chloride 0.9 % 100 mL IVPB  Status:  Discontinued        1 g 200 mL/hr over 30  Minutes Intravenous Every 24 hours 03/17/20 1048 03/18/20 0913   03/16/20 1130  metroNIDAZOLE (FLAGYL) 50 mg/ml oral suspension 500 mg  Status:  Discontinued        500 mg Per Tube 2 times daily 03/16/20 1040 03/19/20 0821   03/16/20 1130  fluconazole (DIFLUCAN) 40 MG/ML suspension 152 mg        150 mg Per Tube  Once 03/16/20 1040 03/16/20 1245   02/11/20 1526  ceFAZolin (ANCEF) IVPB 2g/100 mL premix        2 g 200 mL/hr over 30 Minutes Intravenous  Once 02/11/20 1526 02/11/20 1641   02/05/20 0600  ceFAZolin (ANCEF) IVPB 2g/100 mL premix        2 g 200 mL/hr over 30 Minutes Intravenous To Short Stay 02/04/20 1054 02/05/20 0950   01/29/20 0600  ceFAZolin (ANCEF) IVPB 2g/100 mL premix        2 g 200 mL/hr over 30 Minutes Intravenous On call to O.R. 01/28/20 1511 01/30/20 0559   01/28/20 2000  cefTRIAXone (ROCEPHIN) 1 g in sodium chloride 0.9 % 100 mL IVPB        1 g 200 mL/hr over 30 Minutes Intravenous Every 24 hours 01/28/20 1925 02/02/20 0724   01/06/20 0900  cefTRIAXone (ROCEPHIN) 2 g in sodium chloride 0.9 % 100 mL IVPB  Status:  Discontinued        2 g 200 mL/hr over 30 Minutes Intravenous Every 12 hours 01/06/20 0105 01/06/20 2202   01/06/20 0800  vancomycin (VANCOCIN) IVPB 1000 mg/200 mL premix  Status:  Discontinued       "  Followed by" Linked Group Details   1,000 mg 200 mL/hr over 60 Minutes Intravenous Every 12 hours 01/05/20 1904 01/06/20 2202   01/05/20 2000  vancomycin (VANCOREADY) IVPB 1500 mg/300 mL       "Followed by" Linked Group Details   1,500 mg 150 mL/hr over 120 Minutes Intravenous  Once 01/05/20 1904 01/06/20 0127   01/05/20 1830  cefTRIAXone (ROCEPHIN) 2 g in sodium chloride 0.9 % 100 mL IVPB        2 g 200 mL/hr over 30 Minutes Intravenous  Once 01/05/20 1829 01/05/20 2220       Time spent: 25 minutes    [name] ANP  [facility] 7 am - 330 pm/M-F 113 days

## 2020-04-28 NOTE — Plan of Care (Signed)
  Problem: Education: Goal: Knowledge of General Education information will improve Description: Including pain rating scale, medication(s)/side effects and non-pharmacologic comfort measures Outcome: Progressing   Problem: Clinical Measurements: Goal: Ability to maintain clinical measurements within normal limits will improve Outcome: Progressing Goal: Will remain free from infection Outcome: Progressing Goal: Diagnostic test results will improve Outcome: Progressing Goal: Respiratory complications will improve Outcome: Progressing Goal: Cardiovascular complication will be avoided Outcome: Progressing   Problem: Activity: Goal: Risk for activity intolerance will decrease Outcome: Progressing   Problem: Nutrition: Goal: Adequate nutrition will be maintained Outcome: Progressing   Problem: Coping: Goal: Level of anxiety will decrease Outcome: Progressing   Problem: Elimination: Goal: Will not experience complications related to bowel motility Outcome: Progressing Goal: Will not experience complications related to urinary retention Outcome: Progressing   Problem: Pain Managment: Goal: General experience of comfort will improve Outcome: Progressing   Problem: Safety: Goal: Ability to remain free from injury will improve Outcome: Progressing   Problem: Skin Integrity: Goal: Risk for impaired skin integrity will decrease Outcome: Progressing   Problem: Education: Goal: Expressions of having a comfortable level of knowledge regarding the disease process will increase Outcome: Progressing   Problem: Coping: Goal: Ability to adjust to condition or change in health will improve Outcome: Progressing Goal: Ability to identify appropriate support needs will improve Outcome: Progressing   Problem: Health Behavior/Discharge Planning: Goal: Compliance with prescribed medication regimen will improve Outcome: Progressing   Problem: Medication: Goal: Risk for medication  side effects will decrease Outcome: Progressing   Problem: Clinical Measurements: Goal: Complications related to the disease process, condition or treatment will be avoided or minimized Outcome: Progressing Goal: Diagnostic test results will improve Outcome: Progressing   Problem: Safety: Goal: Verbalization of understanding the information provided will improve Outcome: Progressing   Problem: Self-Concept: Goal: Level of anxiety will decrease Outcome: Progressing Goal: Ability to verbalize feelings about condition will improve Outcome: Progressing   Problem: Education: Goal: Ability to state activities that reduce stress will improve Outcome: Progressing   Problem: Coping: Goal: Ability to identify and develop effective coping behavior will improve Outcome: Progressing   Problem: Self-Concept: Goal: Ability to identify factors that promote anxiety will improve Outcome: Progressing Goal: Level of anxiety will decrease Outcome: Progressing Goal: Ability to modify response to factors that promote anxiety will improve Outcome: Progressing   Problem: Education: Goal: Utilization of techniques to improve thought processes will improve Outcome: Progressing Goal: Knowledge of the prescribed therapeutic regimen will improve Outcome: Progressing   Problem: Activity: Goal: Interest or engagement in leisure activities will improve Outcome: Progressing Goal: Imbalance in normal sleep/wake cycle will improve Outcome: Progressing   Problem: Coping: Goal: Coping ability will improve Outcome: Progressing Goal: Will verbalize feelings Outcome: Progressing   Problem: Health Behavior/Discharge Planning: Goal: Ability to make decisions will improve Outcome: Progressing Goal: Compliance with therapeutic regimen will improve Outcome: Progressing   Problem: Role Relationship: Goal: Will demonstrate positive changes in social behaviors and relationships Outcome: Progressing    Problem: Safety: Goal: Ability to disclose and discuss suicidal ideas will improve Outcome: Progressing Goal: Ability to identify and utilize support systems that promote safety will improve Outcome: Progressing   Problem: Self-Concept: Goal: Will verbalize positive feelings about self Outcome: Progressing Goal: Level of anxiety will decrease Outcome: Progressing   

## 2020-04-29 NOTE — Progress Notes (Signed)
[facility] PROGRESS NOTE  Anita Davis XNA:[id number] DOB: 1986-04-17 DOA: 01/04/2020 PCP: [name], PA-C    02/10/20                      02/15/20                       04/08/20   Status: Remains inpatient appropriate because:Altered mental status, Unsafe d/c plan and Inpatient level of care appropriate due to severity of illness   Dispo: The patient is from: Home              Anticipated d/c is to: SNF              Anticipated d/c date is: > 3 days              Patient currently is medically stable to d/c.  Barriers to discharge: Recent worsening of underlying psychiatric condition requiring short-term IVC in restraints.  IVC removed as of 12/30 and patient has not required restraints in 72 hours.  Also likely needs SNF due to behavioral issues.  It is hopeful at some point patient behavioral stabilize to where family can manage her at home    Code Status: DNR Family Communication: Stepmother [name] 12/29; attempted to call [name] today on 1/7 but no answer DVT prophylaxis: Consistently ambulatory so no DVT prophylaxis indicated Vaccination status: Fully vaccinated against Covid with second vaccine given on 02/23/2020  Pertinent social history Patient has 3 children: [name] and [name]'s father has primary custody and no one in the family knows where they reside With permission from patient's father and stepmother I contacted [name] (telephone number [phone]) who has legal custody of [name] and [name]; at this juncture [name] states that after extensive discussions with [name] he no longer wishes to visit his mother in person.  [name] is only 7 and it is not appropriate for him to visit his mother at this point.  Prior to the patient's acute illness [name] states that Janiene had regular frequent supervised visits with these children.  The possibility of children recording video greetings to their mother has been discussed and she will need to  discuss this with her husband before agreeing. 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  She presented to [facility] ER 3 times prior to admission.  On her 4th presentation she was admitted.  On 9/2 she was diagnosed with acute cystitis with hematuria and was discharged home on Keflex and Zofran.  9/9 she presented with anxiety and symptoms consistent with a panic attack.  She was discharged from the ER with prescription for Vistaril.  She again presented to the ER on 9/10  reporting significant anxiety and severe insomnia and stating the Vistaril was not working.  She was given Ativan while in the ER and this appeared to improve her symptoms.  She was stable for discharge and instructed to take Benadryl at bedtime to assist with her insomnia.  On  9/13 patient brought to the ER by family.  She was refusing to answer questions.  Clinician documented sores/lesions on her legs consistent with methamphetamine use.  When questioned regarding this patient reportedly just stared at the clinician.  It was later determined that the patient had been abusing substances (methamphetamine).  Prior to discharge patient appeared to be hallucinating but was able to verbally communicate.  Over the next hour she became more agitated and was given Ativan 2 mg orally. She later returned to her baseline mentation stating she did not wish to hurt herself or anybody else and requested to be discharged home. She was referred  to [facility] and discharged by EDP.  Later that same day patient was returned to the ER by the police.  Patient was found laying down in a parking lot and acting bizarre.  She apparently told the police that she had put methamphetamines in her pants.  Patient kept reaching for vagina and it was suspected she may have placed drugs intravaginally.  No drugs were ever found.  EEG and MRI were completed with no acute abnormalities; urine drug screen positive for THC as well as Ativan (which had been given in the ED).  She underwent a lumbar puncture which was negative and was empirically started on antibiotics to treat potential infectious etiology to her encephalopathy.  After admission this patient developed seizure activity which progressed to status epilepticus therefore she was transferred to [facility] for further neurological evaluation.  After several days recurrent seizures were aborted with combination of 3 antiepileptic drugs.  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  Unfortunately she did not improve.  Because of persistent altered mentation and inability to sustain appropriate oral intake a PEG tube was placed.  This tube was eventually discontinued after patient became alert and awake enough to eat consistently during the latter portion of the hospitalization.  Since admission patient has had varying levels of awakeness and orientation as well as labile mood and affect.  Essentially for the first 8 weeks of hospitalization patient would have brief episodes of being awake, sometimes oriented sometimes not.  These episodes would last for several minutes to several hours but were never sustained.  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  Multiple psychotropic medications were tried without success or caused significant side effects and were  discontinued.  Decision made to taper and wean all psychotropic drugs.  After this was done patient became extremely alert and agitated.  She was very impulsive and was demonstrating hypersexual behavior as well as extreme mood swings.  Seroquel tried initially to manage brain injury sequela but required high dosages unfortunately contributing to patient's depression and suicidal ideation.  It was noted that each time patient was given IM Geodon that she became alert and for the most part oriented, was able to consistently communicate and perform ADLs.  Eventually she was transitioned to oral Geodon.  Naltrexone had been started to assist with underlying hypersexual behaviors and impulsivity and to treat her underlying heroin addiction since she had not had any methadone or heroin since admission.  She has also been placed on Klonopin and clonidine to aid with her severe anxiety.  Recently patient has had issues with extreme agitation around the holidays and was acting out with wandering behaviors, spitting on staff, using profanity in the hallways and fighting with security staff when restraints  were applied.  Some of her behaviors have been manipulative especially in regards to attempting to obtain drugs such as benzodiazepines or narcotics.  Neurology was reconsulted.  Repeat MRI unremarkable and it is felt current behaviors are combination of brain injury sequela as well as underlying psychiatric issues from chronic drug abuse.  Recently patient has become focused on obtaining methadone.  Typically when she becomes anxious or restless she request some sort of drug: " I need a hot shot" (Ambien), " I need something for my anxiety I cannot take it anymore"; and most recently she is focused on obtaining methadone.  She remains confused and alternates between sad and depressed or angry and defiant.  Subjective: Patient was sedate and quiet.  Did not answer questions but was awake and made eye  contact.  Patient did require a dose of IM Geodon on the evening of 1/6 between 6 and 7 PM.  Objective: Vitals:   04/29/20 0430 04/29/20 1300  BP: 112/70 107/77  Pulse: 79 80  Resp: 18 18  Temp: 98.1 F (36.7 C) 98 F (36.7 C)  SpO2: 98% 98%    Intake/Output Summary (Last 24 hours) at 04/29/2020 1416 Last data filed at 04/29/2020 1300 Gross per 24 hour  Intake 920 ml  Output -  Net 920 ml   Filed Weights   03/29/20 0500 04/04/20 0632 04/05/20 0613  Weight: 72.5 kg 70.9 kg 71.6 kg    Exam: General: Awake, short terse responses but overall is calm Pulmonary:  Lung sounds clear to auscultation, room air saturation stable Cardiac:  S1-S2, pulse regular, no peripheral edema Abdomen: Soft, nondistended.  Nontender.  Bowel sounds present.   Take ranging between 50% and 100% LBM 1/02 Neurological: Moves all extremities x4, no focal neurological deficits, no tremors or rigidity/EPS sx's Psychiatric: Awake, calm, oriented x3.  Answers questions appropriately.     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  Patient denies recent use of methamphetamine prior to admission although she is an inconsistent historian since admission.  Also synthetic forms of stimulant/methamphetamine are usually not detected on standard drug screens -Of note given her prior history of methamphetamine use it is uncertain if she has ever had a acute short-term psychosis.  This puts her at significant risk for developing recurrent psychosis with the reintroduction of  stimulants and/or methamphetamine   On 1/4 patient denies preadmission methamphetamine use (this is contradictory to additional history obtained both from the family and from the medical record) -Continue Geodon 80 mg BID (dose last increased on 1/3).  Anticipate could take 6 to 8 weeks after most recent dose increase before optimal therapeutic effect achieved -Continue naltrexone for impulsivity and hypersexuality -Continue one-to-one safety sitter and restraints as needed for aggressive behavior -In regards to her bizarre behavior she was exhibiting these same symptoms on date of admission and 24 hours previous to admission -Remeron 30 mg HS added on 1/3 and the following morning patient awakened with normal behavior patterns and improved orientation the a.m. of 1/4 although by late afternoon early evening patient had an extreme change in behavior with aggression and anger as well as severe confusion requiring reinstatement of IVC. - Formal psychiatric  reevaluation completed.  It appears we have maxed out on all psychotropic medications for this patient.  It is noted that many of these medications will take weeks to months for full therapeutic benefit.  Given her persistent severe behavioral swings that include violent episodes of physically attacking staff she is not a candidate for an SNF and needs more aggressive therapies.  [facility]  [facility].  I will contact patient's family today to update them on this plan.  (Unable to reach stepmother on 1/7 see above) -Patient does not have chronic seizure disorder but did have status epilepticus in September 2021.  She has been weaned off of AEDs.  Uncertain if she would benefit from ECT and uncertain if she would be a candidate given this seizure history  Status epilepticus -See above regarding methamphetamine use prior to admission -Repeat MRI on 12/28 within  normal limits and it is felt behavioral issues related to brain injury sequelae and chronic psychiatric condition.  Neurology has signed off -Vimpat, Dilantin and phenobarbital have been tapered and discontinued at recommendation of neurologist -Updated neurology regarding clarification of suspected preadmission history of possible excessive methamphetamine use as likely rationale for status epilepticus and she agreed.  Patient also reported had been without methadone for several weeks prior to admission and she could have been experiencing methadone withdrawal which could have precipitated seizure activity as well (especially if she were concurrently utilizing methamphetamine)  History of polysubstance abuse with heroin and methamphetamine -Pres[place] methadone 60 mg daily from [place]  -Has not required methadone since arrival therefore we will continue naltrexone to treat heroin addiction.  This is the last drug clinicians utilize to maintain stability after patients have been successful with methadone.  Would not return to methadone at this juncture.    Depression and severe anxiety disorder/PTSD/Suspected undiagnosed bipolar disorder -Patient continues to demonstrate extremes in behavior she is either calm and mostly appropriate although somewhat confused, extremely withdrawn sad and crying, or extremely agitated and violent and has required IVC status x2 this admission -Continue Klonopin to 1.5 mg daily 6hrs  -Continue Tegretol 600 mg TID (max dose)-check LFTs on 1/6 -She was reevaluated by psychiatry who recommended transition from Tegretol to Depakote if LFTs increase in the context of her underlying hepatitis C -Vistaril discontinued due to drug to drug interaction with Geodon -On 1/6 Lexapro discontinued in favor of Celexa by psychiatry team -Had had several days of less significant agitation or aggressive behavior but 48 hours ago had significant violent outburst requiring  pharmacotherapy and emergency resumption of IVC.  Recurrent constipation -Last bowel movement 12/27 and patient now with nausea, questionable abdominal pain and poor intake -Review abdominal films today on 1/5 revealed pancolonic stool -We will give milk of magnesia x1 today -Begin Colace 100 mg twice daily x3 days and if has started having bowel movements changed to prn -Increase scheduled MiraLAX to twice daily     Other problems: Nonobstructive transaminitis in context of hepatitis C -Elevated HCV antibody with markedly elevated HCV RNA quantitative level  consistent with chronic hepatitis C  -LFTs remain stable without an obstructive pattern noted -11/10: Discussed with ID Dr. Manson PasseyMandahar. Treatment of hepatitis C typically is initiated in the outpatient setting. Medications can be quite expensive and usually take time to obtain insurance approval.  -ID recommends scheduling outpatient appointment their clinic closer to discharge date  Back pain 2/2 abnormal urinalysis consistent with  UTI -Patient with low-grade fever overnight and is now having back pain for days not responsive to NSAIDs and muscle relaxers -DG lumbar spine unremarkable -During hospitalization patient has been treated for pansensitive E. coli UTI as well as Salmonella UTI both of which have been sensitive to Levaquin -Completed Levaquin 12/20-urine culture obtained after antibiotics initiated and showed no growth -CT abdomen and pelvis on 12/17 unremarkable  Salmonella UTI -Completed amoxicillin  Abnormal urinalysis -Not consistent with UTI, was positive for moderate hemoglobin and greater than 50 RBCs with an elevated specific gravity -Suspect dehydration -Patient denies back pain or history of renal calculi although given her confusion unclear if this history is accurate  Physical deconditioning -Resolved -Does continue to exhibit impulsivity but maintains appropriate balance and gait no longer requires  one-to-one safety sitter  Large hemorrhoids. -Resolved -Markedly improved-LD Proctofoam 12/15  Dysphagia -Resolved -Continue regular diet   Data Reviewed: Basic Metabolic Panel: Recent Labs  Lab 04/24/20 0707 04/28/20 0101  NA 138 133*  K 4.3 4.0  CL 98 99  CO2 26 24  GLUCOSE 113* 78  BUN 12 13  CREATININE 0.65 0.66  CALCIUM 9.2 8.2*  MG 2.1  --   PHOS 4.5  --    Liver Function Tests: Recent Labs  Lab 04/24/20 0707 04/28/20 0101  AST 80* 66*  ALT 78* 73*  ALKPHOS 163* 134*  BILITOT 0.7 0.5  PROT 6.6 6.1*  ALBUMIN 3.7 3.4*   No results for input(s): LIPASE, AMYLASE in the last 168 hours. No results for input(s): AMMONIA in the last 168 hours. CBC: Recent Labs  Lab 04/24/20 0707  WBC 5.3  NEUTROABS 3.0  HGB 13.8  HCT 38.9  MCV 94.4  PLT 343   Cardiac Enzymes: No results for input(s): CKTOTAL, CKMB, CKMBINDEX, TROPONINI in the last 168 hours. BNP (last 3 results) No results for input(s): BNP in the last 8760 hours.  ProBNP (last 3 results) No results for input(s): PROBNP in the last 8760 hours.  CBG: No results for input(s): GLUCAP in the last 168 hours.  No results found for this or any previous visit (from the past 240 hour(s)).   Studies: No results found.  Scheduled Meds: . carbamazepine  600 mg Oral TID  . Chlorhexidine Gluconate Cloth  6 each Topical Daily  . citalopram  20 mg Oral Daily  . clonazePAM  1.5 mg Oral Q6H  . cloNIDine  0.1 mg Transdermal Weekly  . diclofenac Sodium  4 g Topical QID  . docusate sodium  100 mg Oral BID  . feeding supplement  237 mL Oral TID BM  . Gerhardt's butt cream  1 application Topical TID  . hydrOXYzine  50 mg Intramuscular Once  . melatonin  5 mg Oral QHS  . mirtazapine  30 mg Oral QHS  . multivitamin with minerals  1 tablet Oral Daily  . naltrexone  50 mg Oral Daily  . polyethylene glycol  17 g Oral BID  . vitamin B-6  100 mg Oral Daily  . thiamine  100 mg Oral Daily  . ziprasidone  80 mg  Oral 2 times per day   Continuous Infusions: . sodium chloride      Principal Problem:   Delirium due to multiple etiologies Active Problems:   Acute metabolic encephalopathy   Hypokalemia   Polysubstance abuse (HCC)   Elevated CK   Transaminitis   Refractory seizure (HCC)   Chronic hepatitis C without hepatic coma (HCC)   Distended abdomen  Palliative care encounter   Colonic Ileus ([place])   Organic brain syndrome   On enteral nutrition   Physical deconditioning   Protein-calorie malnutrition ([place])   Inadequate oral nutritional intake   Impulse disorder   Consultants:  Neurology  Psychiatry  Interventional radiology  Surgery    Procedures:  9/14 lumbar puncture  9/15 EEG  9/16 EEG  9/17 EEG  9/19 overnight EEG with video  9/22 overnight EEG with video with discontinuation of long-term EEG monitoring on 9/25  9/24 core track placement  10/6 EEG  12/15 PEG tube removed    Antibiotics: Anti-infectives (From admission, onward)   Start     Dose/Rate Route Frequency Ordered Stop   04/05/20 1415  levofloxacin (LEVAQUIN) tablet 500 mg        500 mg Oral Daily 04/05/20 1327 04/11/20 0911   03/19/20 1000  metroNIDAZOLE (FLAGYL) tablet 500 mg  Status:  Discontinued        500 mg Per Tube 2 times daily 03/19/20 0822 03/23/20 0827   03/18/20 1200  amoxicillin (AMOXIL) 250 MG/5ML suspension 500 mg  Status:  Discontinued        500 mg Per Tube Every 8 hours 03/18/20 0915 03/23/20 0559   03/17/20 1200  cefTRIAXone (ROCEPHIN) 1 g in sodium chloride 0.9 % 100 mL IVPB  Status:  Discontinued        1 g 200 mL/hr over 30 Minutes Intravenous Every 24 hours 03/17/20 1048 03/18/20 0913   03/16/20 1130  metroNIDAZOLE (FLAGYL) 50 mg/ml oral suspension 500 mg  Status:  Discontinued        500 mg Per Tube 2 times daily 03/16/20 1040 03/19/20 0821   03/16/20 1130  fluconazole (DIFLUCAN) 40 MG/ML suspension 152 mg        150 mg Per Tube  Once 03/16/20 1040 03/16/20 1245    02/11/20 1526  ceFAZolin (ANCEF) IVPB 2g/100 mL premix        2 g 200 mL/hr over 30 Minutes Intravenous  Once 02/11/20 1526 02/11/20 1641   02/05/20 0600  ceFAZolin (ANCEF) IVPB 2g/100 mL premix        2 g 200 mL/hr over 30 Minutes Intravenous To Short Stay 02/04/20 1054 02/05/20 0950   01/29/20 0600  ceFAZolin (ANCEF) IVPB 2g/100 mL premix        2 g 200 mL/hr over 30 Minutes Intravenous On call to O.R. 01/28/20 1511 01/30/20 0559   01/28/20 2000  cefTRIAXone (ROCEPHIN) 1 g in sodium chloride 0.9 % 100 mL IVPB        1 g 200 mL/hr over 30 Minutes Intravenous Every 24 hours 01/28/20 1925 02/02/20 0724   01/06/20 0900  cefTRIAXone (ROCEPHIN) 2 g in sodium chloride 0.9 % 100 mL IVPB  Status:  Discontinued        2 g 200 mL/hr over 30 Minutes Intravenous Every 12 hours 01/06/20 0105 01/06/20 2202   01/06/20 0800  vancomycin (VANCOCIN) IVPB 1000 mg/200 mL premix  Status:  Discontinued       "Followed by" Linked Group Details   1,000 mg 200 mL/hr over 60 Minutes Intravenous Every 12 hours 01/05/20 1904 01/06/20 2202   01/05/20 2000  vancomycin (VANCOREADY) IVPB 1500 mg/300 mL       "Followed by" Linked Group Details   1,500 mg 150 mL/hr over 120 Minutes Intravenous  Once 01/05/20 1904 01/06/20 0127   01/05/20 1830  cefTRIAXone (ROCEPHIN) 2 g in sodium chloride 0.9 % 100 mL IVPB  2 g 200 mL/hr over 30 Minutes Intravenous  Once 01/05/20 1829 01/05/20 2220       Time spent: 25 minutes    [name] ANP  [facility] 7 am - 330 pm/M-F 114 days

## 2020-04-29 NOTE — Progress Notes (Signed)
CSW completed referral to [facility] - referral was sent along with supporting clinical documentation.  Edwin Dada, MSW, LCSW-A Transitions of Care  Clinical Social Worker I [phone]

## 2020-04-30 LAB — CBC
HCT: 39 % (ref 36.0–46.0)
Hemoglobin: 13.6 g/dL (ref 12.0–15.0)
MCH: 33.3 pg (ref 26.0–34.0)
MCHC: 34.9 g/dL (ref 30.0–36.0)
MCV: 95.4 fL (ref 80.0–100.0)
Platelets: 333 10*3/uL (ref 150–400)
RBC: 4.09 MIL/uL (ref 3.87–5.11)
RDW: 12.2 % (ref 11.5–15.5)
WBC: 7.9 10*3/uL (ref 4.0–10.5)
nRBC: 0 % (ref 0.0–0.2)

## 2020-04-30 LAB — BASIC METABOLIC PANEL
Anion gap: 10 (ref 5–15)
BUN: 10 mg/dL (ref 6–20)
CO2: 26 mmol/L (ref 22–32)
Calcium: 9 mg/dL (ref 8.9–10.3)
Chloride: 100 mmol/L (ref 98–111)
Creatinine, Ser: 0.66 mg/dL (ref 0.44–1.00)
GFR, Estimated: >60 mL/min (ref >60)
Glucose, Bld: 108 mg/dL — ABNORMAL HIGH (ref 70–99)
Potassium: 4.1 mmol/L (ref 3.5–5.1)
Sodium: 136 mmol/L (ref 135–145)

## 2020-04-30 NOTE — Progress Notes (Addendum)
Pt grabbed the call bell and stated "I am going to choke myself". Pt then proceeded to put the call bell behind her neck. This Clinical research associate used the judgement that pt is being a harm to herself, taking the cord out of pt.s room. Pt did attempt to kick this Clinical research associate as she took out the call bell. RN notified. Will continue to monitor pt.

## 2020-04-30 NOTE — Progress Notes (Signed)
1045: Pt began listening to her tablet. Playing some music. Pt would occassionally become teary-eyed.  1049: Pt stood up yelling and stating "YOU ARE THE DEVIL" to this Clinical research associate, attempted to grab the scanner of this writer's computer and said "kill me, I know I am in a simulation. End the simulation right now". This writer redirected pt back onto her bed to calm down. Pt slammed her tablet onto her markers. This Clinical research associate asked pt if she wanted to pause her music, pt simply said "f*ck you". This writer placed her markers onto another place in her room. Pt continues to talk to other people who are not in the room in an aggressive tone. Pt repeats "you are the devil", "you took my kids", "I had a better life, you took it from me". Will continue to monitor pt.

## 2020-04-30 NOTE — Progress Notes (Signed)
Pt wanted to go for a walk to distress, but pt then began yelling at random staff members "you CANNOT BE FROM A DIFFERENT RELIGION". This Clinical research associate took pt back into her room without any difficulty. Pt became verbally aggressive. Using harsh profanity words and ordering staff members to "shoot her dead". Will continue to monitor pt.

## 2020-04-30 NOTE — Progress Notes (Signed)
[facility] PROGRESS NOTE  Anita Davis XNA:[id number] DOB: 1986-04-17 DOA: 01/04/2020 PCP: [name], PA-C    02/10/20                      02/15/20                       04/08/20   Status: Remains inpatient appropriate because:Altered mental status, Unsafe d/c plan and Inpatient level of care appropriate due to severity of illness   Dispo: The patient is from: Home              Anticipated d/c is to: SNF              Anticipated d/c date is: > 3 days              Patient currently is medically stable to d/c.  Barriers to discharge: Recent worsening of underlying psychiatric condition requiring short-term IVC in restraints.  IVC removed as of 12/30 and patient has not required restraints in 72 hours.  Also likely needs SNF due to behavioral issues.  It is hopeful at some point patient behavioral stabilize to where family can manage her at home    Code Status: DNR Family Communication: Stepmother Anita Davis 12/29; attempted to call Anita Davis today on 1/7 but no answer DVT prophylaxis: Consistently ambulatory so no DVT prophylaxis indicated Vaccination status: Fully vaccinated against Covid with second vaccine given on 02/23/2020  Pertinent social history Patient has 3 children: Anita Davis and Anita Davis's father has primary custody and no one in the family knows where they reside With permission from patient's father and stepmother I contacted [name] (telephone number [phone]) who has legal custody of Anita Davis and Anita Davis; at this juncture Mrs. Anita Davis states that after extensive discussions with Anita Davis he no longer wishes to visit his mother in person.  Anita Davis is only 5 and it is not appropriate for him to visit his mother at this point.  Prior to the patient's acute illness Mrs. Anita Davis states that Janiene had regular frequent supervised visits with these children.  The possibility of children recording video greetings to their mother has been discussed and she will need to  discuss this with her husband before agreeing. Mrs. 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  She presented to [facility] ER 3 times prior to admission.  On her 4th presentation she was admitted.  On 9/2 she was diagnosed with acute cystitis with hematuria and was discharged home on Keflex and Zofran.  9/9 she presented with anxiety and symptoms consistent with a panic attack.  She was discharged from the ER with prescription for Vistaril.  She again presented to the ER on 9/10  reporting significant anxiety and severe insomnia and stating the Vistaril was not working.  She was given Ativan while in the ER and this appeared to improve her symptoms.  She was stable for discharge and instructed to take Benadryl at bedtime to assist with her insomnia.  On  9/13 patient brought to the ER by family.  She was refusing to answer questions.  Clinician documented sores/lesions on her legs consistent with methamphetamine use.  When questioned regarding this patient reportedly just stared at the clinician.  It was later determined that the patient had been abusing substances (methamphetamine).  Prior to discharge patient appeared to be hallucinating but was able to verbally communicate.  Over the next hour she became more agitated and was given Ativan 2 mg orally. She later returned to her baseline mentation stating she did not wish to hurt herself or anybody else and requested to be discharged home. She was referred  to [facility] and discharged by EDP.  Later that same day patient was returned to the ER by the police.  Patient was found laying down in a parking lot and acting bizarre.  She apparently told the police that she had put methamphetamines in her pants.  Patient kept reaching for vagina and it was suspected she may have placed drugs intravaginally.  No drugs were ever found.  EEG and MRI were completed with no acute abnormalities; urine drug screen positive for THC as well as Ativan (which had been given in the ED).  She underwent a lumbar puncture which was negative and was empirically started on antibiotics to treat potential infectious etiology to her encephalopathy.  After admission this patient developed seizure activity which progressed to status epilepticus therefore she was transferred to [facility] for further neurological evaluation.  After several days recurrent seizures were aborted with combination of 3 antiepileptic drugs.  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  Unfortunately she did not improve.  Because of persistent altered mentation and inability to sustain appropriate oral intake a PEG tube was placed.  This tube was eventually discontinued after patient became alert and awake enough to eat consistently during the latter portion of the hospitalization.  Since admission patient has had varying levels of awakeness and orientation as well as labile mood and affect.  Essentially for the first 8 weeks of hospitalization patient would have brief episodes of being awake, sometimes oriented sometimes not.  These episodes would last for several minutes to several hours but were never sustained.  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  Multiple psychotropic medications were tried without success or caused significant side effects and were  discontinued.  Decision made to taper and wean all psychotropic drugs.  After this was done patient became extremely alert and agitated.  She was very impulsive and was demonstrating hypersexual behavior as well as extreme mood swings.  Seroquel tried initially to manage brain injury sequela but required high dosages unfortunately contributing to patient's depression and suicidal ideation.  It was noted that each time patient was given IM Geodon that she became alert and for the most part oriented, was able to consistently communicate and perform ADLs.  Eventually she was transitioned to oral Geodon.  Naltrexone had been started to assist with underlying hypersexual behaviors and impulsivity and to treat her underlying heroin addiction since she had not had any methadone or heroin since admission.  She has also been placed on Klonopin and clonidine to aid with her severe anxiety.  Recently patient has had issues with extreme agitation around the holidays and was acting out with wandering behaviors, spitting on staff, using profanity in the hallways and fighting with security staff when restraints  were applied.  Some of her behaviors have been manipulative especially in regards to attempting to obtain drugs such as benzodiazepines or narcotics.  Neurology was reconsulted.  Repeat MRI unremarkable and it is felt current behaviors are combination of brain injury sequela as well as underlying psychiatric issues from chronic drug abuse.  Recently patient has become focused on obtaining methadone.  Typically when she becomes anxious or restless she request some sort of drug: " I need a hot shot" (Ambien), " I need something for my anxiety I cannot take it anymore"; and most recently she is focused on obtaining methadone.  She remains confused and alternates between sad and depressed or angry and defiant.  Subjective: Patient seen and examined.  Sitter at the bedside.  Patient was laying in the bed with head on the  left side.  She would track but mostly with eyes.  She is following commands as well.  Not willing to answer questions as asked.  Denied having any complaint and looked very comfortable.  She was able to lift all extremities upon request.  Objective: Vitals:   04/29/20 2056 04/30/20 0446  BP: 116/78 114/76  Pulse: 71 73  Resp: 16 18  Temp: 98.6 F (37 C) 98.4 F (36.9 C)  SpO2: 98% 99%    Intake/Output Summary (Last 24 hours) at 04/30/2020 1327 Last data filed at 04/30/2020 1325 Gross per 24 hour  Intake 657 ml  Output --  Net 657 ml   Filed Weights   03/29/20 0500 04/04/20 0632 04/05/20 0613  Weight: 72.5 kg 70.9 kg 71.6 kg    Exam:  General exam: Appears calm and comfortable  Respiratory system: Clear to auscultation. Respiratory effort normal. Cardiovascular system: S1 & S2 heard, RRR. No JVD, murmurs, rubs, gallops or clicks. No pedal edema. Gastrointestinal system: Abdomen is nondistended, soft and nontender. No organomegaly or masses felt. Normal bowel sounds heard. Central nervous system: Alert and likely oriented but did not want to respond to questions. No focal neurological deficits. Extremities: Symmetric 5 x 5 power.  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  Patient denies recent use of methamphetamine prior to admission although she is an inconsistent historian since admission.  Also synthetic forms of stimulant/methamphetamine are usually not detected on standard drug screens -Of note given her prior history of  methamphetamine use it is uncertain if she has ever had a acute short-term psychosis.  This puts her at significant risk for developing recurrent psychosis with the reintroduction of stimulants and/or methamphetamine   On 1/4 patient denies preadmission methamphetamine use (this is contradictory to additional history obtained both from the family and from the medical record) -Continue Geodon 80 mg BID (dose last increased on 1/3).  Anticipate could take 6 to 8 weeks after most recent dose increase before optimal therapeutic effect achieved -Continue naltrexone for impulsivity and hypersexuality -Continue one-to-one safety sitter and restraints as needed for aggressive behavior -In regards to her bizarre behavior she was exhibiting these same symptoms on date of admission and 24 hours previous to admission -Remeron 30 mg HS added on 1/3 and the following morning patient awakened with normal behavior patterns and improved orientation the a.m. of  1/4 although by late afternoon early evening patient had an extreme change in behavior with aggression and anger as well as severe confusion requiring reinstatement of IVC. - Formal psychiatric reevaluation completed.  It appears we have maxed out on all psychotropic medications for this patient.  It is noted that many of these medications will take weeks to months for full therapeutic benefit.  Given her persistent severe behavioral swings that include violent episodes of physically attacking staff she is not a candidate for an SNF and needs more aggressive therapies.  [facility]  [facility].  I will contact patient's family today to update them on this plan.  (Unable to reach stepmother on 1/7 see above) -Patient does not have chronic seizure disorder but did have status epilepticus in September 2021.  She has been weaned off of AEDs.  Uncertain if she would benefit from ECT  and uncertain if she would be a candidate given this seizure history  Status epilepticus -See above regarding methamphetamine use prior to admission -Repeat MRI on 12/28 within normal limits and it is felt behavioral issues related to brain injury sequelae and chronic psychiatric condition.  Neurology has signed off -Vimpat, Dilantin and phenobarbital have been tapered and discontinued at recommendation of neurologist -Updated neurology regarding clarification of suspected preadmission history of possible excessive methamphetamine use as likely rationale for status epilepticus and she agreed.  Patient also reported had been without methadone for several weeks prior to admission and she could have been experiencing methadone withdrawal which could have precipitated seizure activity as well (especially if she were concurrently utilizing methamphetamine)  History of polysubstance abuse with heroin and methamphetamine -Prescribed methadone 60 mg daily from a clinic in [place]  -Has not required methadone since arrival therefore we will continue naltrexone to treat heroin addiction.  This is the last drug clinicians utilize to maintain stability after patients have been successful with methadone.  Would not return to methadone at this juncture.    Depression and severe anxiety disorder/PTSD/Suspected undiagnosed bipolar disorder -Patient continues to demonstrate extremes in behavior she is either calm and mostly appropriate although somewhat confused, extremely withdrawn sad and crying, or extremely agitated and violent and has required IVC status x2 this admission -Continue Klonopin to 1.5 mg daily 6hrs  -Continue Tegretol 600 mg TID (max dose)-check LFTs on 1/6 -She was reevaluated by psychiatry who recommended transition from Tegretol to Depakote if LFTs increase in the context of her underlying hepatitis C -Vistaril discontinued due to drug to drug interaction with Geodon -On 1/6 Lexapro  discontinued in favor of Celexa by psychiatry team -Had had several days of less significant agitation or aggressive behavior but 48 hours ago had significant violent outburst requiring pharmacotherapy and emergency resumption of IVC.  Recurrent constipation -Last bowel movement 12/27 and patient now with nausea, questionable abdominal pain and poor intake -Review abdominal films today on 1/5 revealed pancolonic stool -We will give milk of magnesia x1 today -Begin Colace 100 mg twice daily x3 days and if has started having bowel movements changed to prn -Increase scheduled MiraLAX to twice daily     Other problems: Nonobstructive transaminitis in context of hepatitis C -Elevated HCV antibody with markedly elevated HCV RNA quantitative level  consistent with chronic hepatitis C  -LFTs remain stable without an obstructive pattern noted -11/10: Discussed with ID Dr. Manson PasseyMandahar. Treatment of hepatitis C typically is initiated in the outpatient setting. Medications can be  quite expensive and usually take time to obtain insurance approval.  -ID recommends scheduling outpatient appointment their clinic closer to discharge date  Back pain 2/2 abnormal urinalysis consistent with UTI -Patient with low-grade fever overnight and is now having back pain for days not responsive to NSAIDs and muscle relaxers -DG lumbar spine unremarkable -During hospitalization patient has been treated for pansensitive E. coli UTI as well as Salmonella UTI both of which have been sensitive to Levaquin -Completed Levaquin 12/20-urine culture obtained after antibiotics initiated and showed no growth -CT abdomen and pelvis on 12/17 unremarkable  Salmonella UTI -Completed amoxicillin  Abnormal urinalysis -Not consistent with UTI, was positive for moderate hemoglobin and greater than 50 RBCs with an elevated specific gravity -Suspect dehydration -Patient denies back pain or history of renal calculi although given her  confusion unclear if this history is accurate  Physical deconditioning -Resolved -Does continue to exhibit impulsivity but maintains appropriate balance and gait no longer requires one-to-one safety sitter  Large hemorrhoids. -Resolved -Markedly improved-LD Proctofoam 12/15  Dysphagia -Resolved -Continue regular diet  Patient has significant psychiatric disorders, likely multiple diagnoses.  Patient has been on the medical floor for very long time now.  Per my discussion with Revonda StandardAllison, nurse practitioner, psychiatry does not think she needs to be in the psychiatry unit unless she is suicidal.  I truly believe that patient will benefit more under psychiatry care instead of on the medical care.  Data Reviewed: Basic Metabolic Panel: Recent Labs  Lab 04/24/20 0707 04/28/20 0101 04/30/20 0128  NA 138 133* 136  K 4.3 4.0 4.1  CL 98 99 100  CO2 26 24 26   GLUCOSE 113* 78 108*  BUN 12 13 10   CREATININE 0.65 0.66 0.66  CALCIUM 9.2 8.2* 9.0  MG 2.1  --   --   PHOS 4.5  --   --    Liver Function Tests: Recent Labs  Lab 04/24/20 0707 04/28/20 0101  AST 80* 66*  ALT 78* 73*  ALKPHOS 163* 134*  BILITOT 0.7 0.5  PROT 6.6 6.1*  ALBUMIN 3.7 3.4*   No results for input(s): LIPASE, AMYLASE in the last 168 hours. No results for input(s): AMMONIA in the last 168 hours. CBC: Recent Labs  Lab 04/24/20 0707 04/30/20 0128  WBC 5.3 7.9  NEUTROABS 3.0  --   HGB 13.8 13.6  HCT 38.9 39.0  MCV 94.4 95.4  PLT 343 333   Cardiac Enzymes: No results for input(s): CKTOTAL, CKMB, CKMBINDEX, TROPONINI in the last 168 hours. BNP (last 3 results) No results for input(s): BNP in the last 8760 hours.  ProBNP (last 3 results) No results for input(s): PROBNP in the last 8760 hours.  CBG: No results for input(s): GLUCAP in the last 168 hours.  No results found for this or any previous visit (from the past 240 hour(s)).   Studies: No results found.  Scheduled Meds: . carbamazepine   600 mg Oral TID  . Chlorhexidine Gluconate Cloth  6 each Topical Daily  . citalopram  20 mg Oral Daily  . clonazePAM  1.5 mg Oral Q6H  . cloNIDine  0.1 mg Transdermal Weekly  . diclofenac Sodium  4 g Topical QID  . docusate sodium  100 mg Oral BID  . feeding supplement  237 mL Oral TID BM  . Gerhardt's butt cream  1 application Topical TID  . hydrOXYzine  50 mg Intramuscular Once  . melatonin  5 mg Oral QHS  . mirtazapine  30 mg Oral  QHS  . multivitamin with minerals  1 tablet Oral Daily  . naltrexone  50 mg Oral Daily  . polyethylene glycol  17 g Oral BID  . vitamin B-6  100 mg Oral Daily  . thiamine  100 mg Oral Daily  . ziprasidone  80 mg Oral 2 times per day   Continuous Infusions: . sodium chloride      Principal Problem:   Delirium due to multiple etiologies Active Problems:   Acute metabolic encephalopathy   Hypokalemia   Polysubstance abuse (HCC)   Elevated CK   Transaminitis   Refractory seizure (HCC)   Chronic hepatitis C without hepatic coma (HCC)   Distended abdomen   Palliative care encounter   Colonic Ileus (HCC)   Organic brain syndrome   On enteral nutrition   Physical deconditioning   Protein-calorie malnutrition (HCC)   Inadequate oral nutritional intake   Impulse disorder   Consultants:  Neurology  Psychiatry  Interventional radiology  Surgery    Procedures:  9/14 lumbar puncture  9/15 EEG  9/16 EEG  9/17 EEG  9/19 overnight EEG with video  9/22 overnight EEG with video with discontinuation of long-term EEG monitoring on 9/25  9/24 core track placement  10/6 EEG  12/15 PEG tube removed    Antibiotics: Anti-infectives (From admission, onward)   Start     Dose/Rate Route Frequency Ordered Stop   04/05/20 1415  levofloxacin (LEVAQUIN) tablet 500 mg        500 mg Oral Daily 04/05/20 1327 04/11/20 0911   03/19/20 1000  metroNIDAZOLE (FLAGYL) tablet 500 mg  Status:  Discontinued        500 mg Per Tube 2 times daily  03/19/20 0822 03/23/20 0827   03/18/20 1200  amoxicillin (AMOXIL) 250 MG/5ML suspension 500 mg  Status:  Discontinued        500 mg Per Tube Every 8 hours 03/18/20 0915 03/23/20 0559   03/17/20 1200  cefTRIAXone (ROCEPHIN) 1 g in sodium chloride 0.9 % 100 mL IVPB  Status:  Discontinued        1 g 200 mL/hr over 30 Minutes Intravenous Every 24 hours 03/17/20 1048 03/18/20 0913   03/16/20 1130  metroNIDAZOLE (FLAGYL) 50 mg/ml oral suspension 500 mg  Status:  Discontinued        500 mg Per Tube 2 times daily 03/16/20 1040 03/19/20 0821   03/16/20 1130  fluconazole (DIFLUCAN) 40 MG/ML suspension 152 mg        150 mg Per Tube  Once 03/16/20 1040 03/16/20 1245   02/11/20 1526  ceFAZolin (ANCEF) IVPB 2g/100 mL premix        2 g 200 mL/hr over 30 Minutes Intravenous  Once 02/11/20 1526 02/11/20 1641   02/05/20 0600  ceFAZolin (ANCEF) IVPB 2g/100 mL premix        2 g 200 mL/hr over 30 Minutes Intravenous To Short Stay 02/04/20 1054 02/05/20 0950   01/29/20 0600  ceFAZolin (ANCEF) IVPB 2g/100 mL premix        2 g 200 mL/hr over 30 Minutes Intravenous On call to O.R. 01/28/20 1511 01/30/20 0559   01/28/20 2000  cefTRIAXone (ROCEPHIN) 1 g in sodium chloride 0.9 % 100 mL IVPB        1 g 200 mL/hr over 30 Minutes Intravenous Every 24 hours 01/28/20 1925 02/02/20 0724   01/06/20 0900  cefTRIAXone (ROCEPHIN) 2 g in sodium chloride 0.9 % 100 mL IVPB  Status:  Discontinued  2 g 200 mL/hr over 30 Minutes Intravenous Every 12 hours 01/06/20 0105 01/06/20 2202   01/06/20 0800  vancomycin (VANCOCIN) IVPB 1000 mg/200 mL premix  Status:  Discontinued       "Followed by" Linked Group Details   1,000 mg 200 mL/hr over 60 Minutes Intravenous Every 12 hours 01/05/20 1904 01/06/20 2202   01/05/20 2000  vancomycin (VANCOREADY) IVPB 1500 mg/300 mL       "Followed by" Linked Group Details   1,500 mg 150 mL/hr over 120 Minutes Intravenous  Once 01/05/20 1904 01/06/20 0127   01/05/20 1830  cefTRIAXone  (ROCEPHIN) 2 g in sodium chloride 0.9 % 100 mL IVPB        2 g 200 mL/hr over 30 Minutes Intravenous  Once 01/05/20 1829 01/05/20 2220       Time spent: 28 minutes    Hughie Closs MD [facility] 7 am - 330 pm/M-F 115 days

## 2020-04-30 NOTE — Progress Notes (Signed)
Pt awake and calm, cooperative. Needs to go to bathroom. Restraints removed at this time. Will continue to monitor

## 2020-04-30 NOTE — Progress Notes (Signed)
Pt verbally and physically abusive at this time with staff. Uncooperative. Called security to come to unit. Geodon IM given at this time with help from security. Pt wanting to talk with [place]. Also hollers out "I want to die- I'm already dead." Placed in soft restraints(see flowsheet). Will continue to monitor.

## 2020-04-30 NOTE — Progress Notes (Addendum)
Pt has slept at short intervals since restraints removed (0115). Has remained calm, quite and cooperative.

## 2020-04-30 NOTE — Progress Notes (Signed)
Pt threw a box of crayons to the floor, crayons picked up and taken out of room. Pt keeps talking to people who are not in this room. Will continue to monitor pt.

## 2020-04-30 NOTE — Progress Notes (Signed)
Pt asleep at this time. Sitter at bedside.

## 2020-04-30 NOTE — Progress Notes (Signed)
Pt would have small outbursts in which she would start crying or be verbally aggressive throughout the rest of the afternoon. Pt was able to be redirectable by being verbally redirected into her room. Pt is currently on her tablet. Will continue to monitor pt.

## 2020-05-01 NOTE — Progress Notes (Signed)
Pt states "I have already killed sarah" When asked who Maralyn Sago is, Pt raised arms above head and did not acknowledge the question. Pt also states that staff is "Eating her brains" and repeatedly tries to take scanner off the WOW. As Clinical research associate type this, patient is stating that she is "Supposed to be there helping Gunnar Fusi" and pointing at computer screen. RN aware, will continue to monitor.

## 2020-05-01 NOTE — Progress Notes (Signed)
Pt tablet remains in her possession- previous Clinical research associate states it was removed for pt safety. This NT has no reason to remove it as pt shows no safety risks at this time

## 2020-05-01 NOTE — Progress Notes (Signed)
[facility] PROGRESS NOTE  Doneen Poissonmanda Welch ZOX:[id number] DOB: 1985-08-16 DOA: 01/04/2020 PCP: Jacquelin HawkingMcElroy, Shannon, PA-C    02/10/20                      02/15/20                       04/08/20   Status: Remains inpatient appropriate because:Altered mental status, Unsafe d/c plan and Inpatient level of care appropriate due to severity of illness   Dispo: The patient is from: Home              Anticipated d/c is to: SNF              Anticipated d/c date is: > 3 days              Patient currently is medically stable to d/c.  Barriers to discharge: Recent worsening of underlying psychiatric condition requiring short-term IVC in restraints.  IVC removed as of 12/30 and patient has not required restraints in 72 hours.  Also likely needs SNF due to behavioral issues.  It is hopeful at some point patient behavioral stabilize to where family can manage her at home    Code Status: DNR Family Communication:  DVT prophylaxis: Consistently ambulatory so no DVT prophylaxis indicated Vaccination status: Fully vaccinated against Covid with second vaccine given on 02/23/2020  Pertinent social history Patient has 3 children: Grace Bushyremaine, [name] and [name]'s father has primary custody and no one in the family knows where they reside With permission from patient's father and stepmother I contacted Charlean MerlLeslie Lowe (telephone number [phone]) who has legal custody of [name] and [name]; at this juncture Mrs. Rana SnareLowe states that after extensive discussions with [name] he no longer wishes to visit his mother in person.  [name] is only 7 and it is not appropriate for him to visit his mother at this point.  Prior to the patient's acute illness Mrs. Rana SnareLowe states that Marchelle Folksmanda had regular frequent supervised visits with these children.  The possibility of children recording video greetings to their mother has been discussed and she will need to discuss this with her husband before agreeing. Mrs. 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  She presented to [facility] 3 times prior to admission.  On her 4th presentation she was admitted.  On 9/2 she was diagnosed with acute cystitis with hematuria and was discharged home on Keflex and Zofran.  9/9 she presented with anxiety and symptoms consistent with a panic attack.  She was discharged from the ER with prescription for Vistaril.  She again presented to the ER on 9/10  reporting significant anxiety and severe insomnia and stating the Vistaril was not working.  She was given Ativan while in the ER and this appeared to improve her symptoms.  She was stable for discharge and instructed to take Benadryl at bedtime to assist with her insomnia.  On 9/13 patient brought to the ER by family.  She was refusing  to answer questions.  Clinician documented sores/lesions on her legs consistent with methamphetamine use.  When questioned regarding this patient reportedly just stared at the clinician.  It was later determined that the patient had been abusing substances (methamphetamine).  Prior to discharge patient appeared to be hallucinating but was able to verbally communicate.  Over the next hour she became more agitated and was given Ativan 2 mg orally. She later returned to her baseline mentation stating she did not wish to hurt herself or anybody else and requested to be discharged home. She was referred to [facility] and discharged by EDP.  Later that same day patient was returned  to the ER by the police.  Patient was found laying down in a parking lot and acting bizarre.  She apparently told the police that she had put methamphetamines in her pants.  Patient kept reaching for vagina and it was suspected she may have placed drugs intravaginally.  No drugs were ever found.  EEG and MRI were completed with no acute abnormalities; urine drug screen positive for THC as well as Ativan (which had been given in the ED).  She underwent a lumbar puncture which was negative and was empirically started on antibiotics to treat potential infectious etiology to her encephalopathy.  After admission this patient developed seizure activity which progressed to status epilepticus therefore she was transferred to [facility] for further neurological evaluation.  After several days recurrent seizures were aborted with combination of 3 antiepileptic drugs.  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  Unfortunately she did not improve.  Because of persistent altered mentation and inability to sustain appropriate oral intake a PEG tube was placed.  This tube was eventually discontinued after patient became alert and awake enough to eat consistently during the latter portion of the hospitalization.  Since admission patient has had varying levels of awakeness and orientation as well as labile mood and affect.  Essentially for the first 8 weeks of hospitalization patient would have brief episodes of being awake, sometimes oriented sometimes not.  These episodes would last for several minutes to several hours but were never sustained.  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  Multiple psychotropic medications were tried without success or caused significant side effects and were discontinued.  Decision made to taper and wean all psychotropic drugs.  After this  was done patient became extremely alert and agitated.  She was very impulsive and was demonstrating hypersexual behavior as well as extreme mood swings.  Seroquel tried initially to manage brain injury sequela but required high dosages unfortunately contributing to patient's depression and suicidal ideation.  It was noted that each time patient was given IM Geodon that she became alert and for the most part oriented, was able to consistently communicate and perform ADLs.  Eventually she was transitioned to oral Geodon.  Naltrexone had been started to assist with underlying hypersexual behaviors and impulsivity and to treat her underlying heroin addiction since she had not had any methadone or heroin since admission.  She has also been placed on Klonopin and clonidine to aid with her severe anxiety.  Recently patient has had issues with extreme agitation around the holidays and was acting out with wandering behaviors, spitting on staff, using profanity in the hallways and fighting with security staff when restraints were applied.  Some of her behaviors have been manipulative especially in  regards to attempting to obtain drugs such as benzodiazepines or narcotics.  Neurology was reconsulted.  Repeat MRI unremarkable and it is felt current behaviors are combination of brain injury sequela as well as underlying psychiatric issues from chronic drug abuse.  Recently patient has become focused on obtaining methadone.  Typically when she becomes anxious or restless she request some sort of drug: " I need a hot shot" (Ambien), " I need something for my anxiety I cannot take it anymore"; and most recently she is focused on obtaining methadone.  She remains confused and alternates between sad and depressed or angry and defiant.  Subjective: Seen and examined.  No complaints.  Answering questions but has a flat affect.  Objective: Vitals:   04/30/20 2100 05/01/20 0615  BP: 114/76 108/76  Pulse: 78 80  Resp: 16 17   Temp: 98 F (36.7 C) 98.4 F (36.9 C)  SpO2: 99% 98%    Intake/Output Summary (Last 24 hours) at 05/01/2020 1244 Last data filed at 05/01/2020 0500 Gross per 24 hour  Intake 377 ml  Output --  Net 377 ml   Filed Weights   03/29/20 0500 04/04/20 0632 04/05/20 0613  Weight: 72.5 kg 70.9 kg 71.6 kg    Exam: General exam: Appears calm and comfortable  Respiratory system: Clear to auscultation. Respiratory effort normal. Cardiovascular system: S1 & S2 heard, RRR. No JVD, murmurs, rubs, gallops or clicks. No pedal edema. Gastrointestinal system: Abdomen is nondistended, soft and nontender. No organomegaly or masses felt. Normal bowel sounds heard. Central nervous system: Alert and oriented. No focal neurological deficits. Extremities: Symmetric 5 x 5 power.  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  Patient denies recent use of methamphetamine prior to admission although she is an inconsistent historian since admission.  Also synthetic forms of stimulant/methamphetamine are usually not detected on standard drug screens -Of note given her prior history of methamphetamine use it is uncertain if she has ever had a acute short-term psychosis.  This puts her at significant risk for developing recurrent psychosis with the reintroduction of stimulants and/or methamphetamine   On 1/4 patient denies preadmission methamphetamine use (this is contradictory to additional history obtained both from the family and from the medical record) -Continue Geodon 80 mg  BID (dose last increased on 1/3).  Anticipate could take 6 to 8 weeks after most recent dose increase before optimal therapeutic effect achieved -Continue naltrexone for impulsivity and hypersexuality -Continue one-to-one safety sitter and restraints as needed for aggressive behavior -In regards to her bizarre behavior she was exhibiting these same symptoms on date of admission and 24 hours previous to admission -Remeron 30 mg HS added on 1/3 and the following morning patient awakened with normal behavior patterns and improved orientation the a.m. of 1/4 although by late afternoon early evening patient had an extreme change in behavior with aggression and anger as well as severe confusion requiring reinstatement of IVC. - Formal psychiatric reevaluation completed.  It appears we have maxed out on all psychotropic medications for this patient.  It is noted that many of these medications will take weeks to months for full therapeutic benefit.  Given her persistent severe behavioral swings that  include violent episodes of physically attacking staff she is not a candidate for an SNF and needs more aggressive therapies.  Psychiatry [facility]  [facility] aware and is beginning this process.  I will contact patient's family today to update them on this plan.  (Unable to reach stepmother on 1/7 see above) -Patient does not have chronic seizure disorder but did have status epilepticus in September 2021.  She has been weaned off of AEDs.  Uncertain if she would benefit from ECT and uncertain if she would be a candidate given this seizure history  Status epilepticus -See above regarding methamphetamine use prior to admission -Repeat MRI on 12/28 within normal limits and it is felt behavioral issues related to brain injury sequelae and chronic psychiatric condition.  Neurology has signed off -Vimpat, Dilantin and phenobarbital have been tapered and discontinued at  recommendation of neurologist -Updated neurology regarding clarification of suspected preadmission history of possible excessive methamphetamine use as likely rationale for status epilepticus and she agreed.  Patient also reported had been without methadone for several weeks prior to admission and she could have been experiencing methadone withdrawal which could have precipitated seizure activity as well (especially if she were concurrently utilizing methamphetamine)  History of polysubstance abuse with heroin and methamphetamine -Prescribed methadone 60 mg daily from a clinic in [place]  -Has not required methadone since arrival therefore we will continue naltrexone to treat heroin addiction.  This is the last drug clinicians utilize to maintain stability after patients have been successful with methadone.  Would not return to methadone at this juncture.    Depression and severe anxiety disorder/PTSD/Suspected undiagnosed bipolar disorder -Patient continues to demonstrate extremes in behavior she is either calm and mostly appropriate although somewhat confused, extremely withdrawn sad and crying, or extremely agitated and violent and has required IVC status x2 this admission -Continue Klonopin to 1.5 mg daily 6hrs  -Continue Tegretol 600 mg TID (max dose)-check LFTs on 1/6 -She was reevaluated by psychiatry who recommended transition from Tegretol to Depakote if LFTs increase in the context of her underlying hepatitis C -Vistaril discontinued due to drug to drug interaction with Geodon -On 1/6 Lexapro discontinued in favor of Celexa by psychiatry team -Had had several days of less significant agitation or aggressive behavior but 48 hours ago had significant violent outburst requiring pharmacotherapy and emergency resumption of IVC.  Recurrent constipation -Last bowel movement 12/27 and patient now with nausea, questionable abdominal pain and poor intake -Review abdominal films today on 1/5  revealed pancolonic stool -We will give milk of magnesia x1 today -Begin Colace 100 mg twice daily x3 days and if has started having bowel movements changed to prn -Increase scheduled MiraLAX to twice daily     Other problems: Nonobstructive transaminitis in context of hepatitis C -Elevated HCV antibody with markedly elevated HCV RNA quantitative level  consistent with chronic hepatitis C  -LFTs remain stable without an obstructive pattern noted -11/10: Discussed with ID Dr. Manson Passey. Treatment of hepatitis C typically is initiated in the outpatient setting. Medications can be quite expensive and usually take time to obtain insurance approval.  -ID recommends scheduling outpatient appointment their clinic closer to discharge date  Back pain 2/2 abnormal urinalysis consistent with UTI -Patient with low-grade fever overnight and is now having back pain for days not responsive to NSAIDs and muscle relaxers -DG lumbar spine unremarkable -During hospitalization patient has been treated for pansensitive E. coli UTI as well as Salmonella UTI both  of which have been sensitive to Levaquin -Completed Levaquin 12/20-urine culture obtained after antibiotics initiated and showed no growth -CT abdomen and pelvis on 12/17 unremarkable  Salmonella UTI -Completed amoxicillin  Abnormal urinalysis -Not consistent with UTI, was positive for moderate hemoglobin and greater than 50 RBCs with an elevated specific gravity -Suspect dehydration -Patient denies back pain or history of renal calculi although given her confusion unclear if this history is accurate  Physical deconditioning -Resolved -Does continue to exhibit impulsivity but maintains appropriate balance and gait no longer requires one-to-one safety sitter  Large hemorrhoids. -Resolved -Markedly improved-LD Proctofoam 12/15  Dysphagia -Resolved -Continue regular diet  Patient has significant psychiatric disorders, likely multiple  diagnoses.  Patient has been on the medical floor for very long time now.  Per my discussion with [name], nurse practitioner, psychiatry does not think she needs to be in the psychiatry unit unless she is suicidal.  I truly believe that patient will benefit more under psychiatry care instead of on the medical care.  Data Reviewed: Basic Metabolic Panel: Recent Labs  Lab 04/28/20 0101 04/30/20 0128  NA 133* 136  K 4.0 4.1  CL 99 100  CO2 24 26  GLUCOSE 78 108*  BUN 13 10  CREATININE 0.66 0.66  CALCIUM 8.2* 9.0   Liver Function Tests: Recent Labs  Lab 04/28/20 0101  AST 66*  ALT 73*  ALKPHOS 134*  BILITOT 0.5  PROT 6.1*  ALBUMIN 3.4*   No results for input(s): LIPASE, AMYLASE in the last 168 hours. No results for input(s): AMMONIA in the last 168 hours. CBC: Recent Labs  Lab 04/30/20 0128  WBC 7.9  HGB 13.6  HCT 39.0  MCV 95.4  PLT 333   Cardiac Enzymes: No results for input(s): CKTOTAL, CKMB, CKMBINDEX, TROPONINI in the last 168 hours. BNP (last 3 results) No results for input(s): BNP in the last 8760 hours.  ProBNP (last 3 results) No results for input(s): PROBNP in the last 8760 hours.  CBG: No results for input(s): GLUCAP in the last 168 hours.  No results found for this or any previous visit (from the past 240 hour(s)).   Studies: No results found.  Scheduled Meds: . carbamazepine  600 mg Oral TID  . Chlorhexidine Gluconate Cloth  6 each Topical Daily  . citalopram  20 mg Oral Daily  . clonazePAM  1.5 mg Oral Q6H  . cloNIDine  0.1 mg Transdermal Weekly  . diclofenac Sodium  4 g Topical QID  . docusate sodium  100 mg Oral BID  . feeding supplement  237 mL Oral TID BM  . Gerhardt's butt cream  1 application Topical TID  . hydrOXYzine  50 mg Intramuscular Once  . melatonin  5 mg Oral QHS  . mirtazapine  30 mg Oral QHS  . multivitamin with minerals  1 tablet Oral Daily  . naltrexone  50 mg Oral Daily  . polyethylene glycol  17 g Oral BID  .  vitamin B-6  100 mg Oral Daily  . thiamine  100 mg Oral Daily  . ziprasidone  80 mg Oral 2 times per day   Continuous Infusions: . sodium chloride      Principal Problem:   Delirium due to multiple etiologies Active Problems:   Acute metabolic encephalopathy   Hypokalemia   Polysubstance abuse (HCC)   Elevated CK   Transaminitis   Refractory seizure (HCC)   Chronic hepatitis C without hepatic coma (HCC)   Distended abdomen   Palliative care  encounter   Colonic Ileus (HCC)   Organic brain syndrome   On enteral nutrition   Physical deconditioning   Protein-calorie malnutrition (HCC)   Inadequate oral nutritional intake   Impulse disorder   Consultants:  Neurology  Psychiatry  Interventional radiology  Surgery    Procedures:  9/14 lumbar puncture  9/15 EEG  9/16 EEG  9/17 EEG  9/19 overnight EEG with video  9/22 overnight EEG with video with discontinuation of long-term EEG monitoring on 9/25  9/24 core track placement  10/6 EEG  12/15 PEG tube removed    Antibiotics: Anti-infectives (From admission, onward)   Start     Dose/Rate Route Frequency Ordered Stop   04/05/20 1415  levofloxacin (LEVAQUIN) tablet 500 mg        500 mg Oral Daily 04/05/20 1327 04/11/20 0911   03/19/20 1000  metroNIDAZOLE (FLAGYL) tablet 500 mg  Status:  Discontinued        500 mg Per Tube 2 times daily 03/19/20 0822 03/23/20 0827   03/18/20 1200  amoxicillin (AMOXIL) 250 MG/5ML suspension 500 mg  Status:  Discontinued        500 mg Per Tube Every 8 hours 03/18/20 0915 03/23/20 0559   03/17/20 1200  cefTRIAXone (ROCEPHIN) 1 g in sodium chloride 0.9 % 100 mL IVPB  Status:  Discontinued        1 g 200 mL/hr over 30 Minutes Intravenous Every 24 hours 03/17/20 1048 03/18/20 0913   03/16/20 1130  metroNIDAZOLE (FLAGYL) 50 mg/ml oral suspension 500 mg  Status:  Discontinued        500 mg Per Tube 2 times daily 03/16/20 1040 03/19/20 0821   03/16/20 1130  fluconazole (DIFLUCAN)  40 MG/ML suspension 152 mg        150 mg Per Tube  Once 03/16/20 1040 03/16/20 1245   02/11/20 1526  ceFAZolin (ANCEF) IVPB 2g/100 mL premix        2 g 200 mL/hr over 30 Minutes Intravenous  Once 02/11/20 1526 02/11/20 1641   02/05/20 0600  ceFAZolin (ANCEF) IVPB 2g/100 mL premix        2 g 200 mL/hr over 30 Minutes Intravenous To Short Stay 02/04/20 1054 02/05/20 0950   01/29/20 0600  ceFAZolin (ANCEF) IVPB 2g/100 mL premix        2 g 200 mL/hr over 30 Minutes Intravenous On call to O.R. 01/28/20 1511 01/30/20 0559   01/28/20 2000  cefTRIAXone (ROCEPHIN) 1 g in sodium chloride 0.9 % 100 mL IVPB        1 g 200 mL/hr over 30 Minutes Intravenous Every 24 hours 01/28/20 1925 02/02/20 0724   01/06/20 0900  cefTRIAXone (ROCEPHIN) 2 g in sodium chloride 0.9 % 100 mL IVPB  Status:  Discontinued        2 g 200 mL/hr over 30 Minutes Intravenous Every 12 hours 01/06/20 0105 01/06/20 2202   01/06/20 0800  vancomycin (VANCOCIN) IVPB 1000 mg/200 mL premix  Status:  Discontinued       "Followed by" Linked Group Details   1,000 mg 200 mL/hr over 60 Minutes Intravenous Every 12 hours 01/05/20 1904 01/06/20 2202   01/05/20 2000  vancomycin (VANCOREADY) IVPB 1500 mg/300 mL       "Followed by" Linked Group Details   1,500 mg 150 mL/hr over 120 Minutes Intravenous  Once 01/05/20 1904 01/06/20 0127   01/05/20 1830  cefTRIAXone (ROCEPHIN) 2 g in sodium chloride 0.9 % 100 mL IVPB  2 g 200 mL/hr over 30 Minutes Intravenous  Once 01/05/20 1829 01/05/20 2220       Time spent: 24 minutes    Hughie Closs MD [facility] 7 am - 330 pm/M-F 116 days

## 2020-05-01 NOTE — Progress Notes (Signed)
Pt sobbing and pacing in her room. Pt states she is "Judas" and requests to see her IVC paperwork

## 2020-05-01 NOTE — Progress Notes (Addendum)
Pt keeps asking this writer to come closer to her. When asked why she states "So I can hold your hand" or "So you can let me see your arms" When asked why she needs to see my arms pt states that she just does. RN aware, will continue to monitor.   1557: Pt states "I do not want you in here. I want a normal tech who is not a liar" Then proceeded to put hand mittens on and raise arms above head while lying in bed while saying "He is not dead! If I am the queen, he is not dead. I am mandy. Dad if I am going down you are going down with me. Every hoodwinker"   Will continue to monitor.   1625: Patient stated she would like to leave and became agitated when informed that she is under an IVC. Patient then threw her coloring books and pages across the room. Writer then removed coloring supplies from room for safety. Pt then demanded to see the court order multiple times despite being informed that writer cannot. Pt states she does not care and that she "Knows Josh in [employer] office" RN aware. Will continue to monitor.

## 2020-05-01 NOTE — Progress Notes (Signed)
Patient threw tablet across room. Writer removed tablet for safety. Patient got upset and began crying and writhing in the bed while saying "I'm already dead. So what does it matter. I'm dead!" will continue to monitor. RN aware  1810: Patient walked out in the hallway demanding a mask and to go for a walk around the unit. Writer redirected patient after 5 minutes and patient went back in her room.

## 2020-05-02 MED ORDER — NALTREXONE HCL 50 MG PO TABS
25.0000 mg | ORAL_TABLET | Freq: Every day | ORAL | Status: DC
  Administered 2020-05-03 – 2020-07-02 (×60): 25 mg via ORAL
  Filled 2020-05-02 (×61): qty 1

## 2020-05-02 MED ORDER — CLONAZEPAM 0.5 MG PO TABS: 0.5000 mg | ORAL_TABLET | Freq: Four times a day (QID) | ORAL | Status: DC

## 2020-05-02 MED ORDER — CLONAZEPAM 1 MG PO TABS
1.0000 mg | ORAL_TABLET | Freq: Four times a day (QID) | ORAL | Status: DC
  Administered 2020-05-02 – 2020-05-03 (×4): 1 mg via ORAL
  Filled 2020-05-02 (×4): qty 1

## 2020-05-02 MED ORDER — CLONAZEPAM 1 MG PO TABS: 1.0000 mg | ORAL_TABLET | Freq: Four times a day (QID) | ORAL | Status: DC

## 2020-05-02 NOTE — Progress Notes (Addendum)
[facility] PROGRESS NOTE  Anita Davis VXB:[id number] DOB: 1985/08/20 DOA: 01/04/2020 PCP: Anita Hawking, PA-C    02/10/20                      02/15/20                       04/08/20   Status: Remains inpatient appropriate because:Altered mental status, Unsafe d/c plan and Inpatient level of care appropriate due to severity of illness   Dispo: The patient is from: Home              Anticipated d/c is to: SNF              Anticipated d/c date is: > 3 days              Patient currently is medically stable to d/c.  Barriers to discharge; continued high severity psychiatric illness with waxing and waning mentation with violent outburst requiring IVC and anticipated discharge long-term to state psychiatric facility  ** 1/10 Early during the admission patient's aunt Anita Davis opted to pursue guardianship for the patient before clearing it with the rest of the family including the patient's father.  Subsequently she has received a significant volume of paperwork regarding Medicaid and disability applications which she has just now turned over to the patient's father.  Upon talking with the patient's stepmother she states based on her conversations with multiple providers with Medicaid and disability that they were led to believe that this patient was living in a private home with Anita Addison.  They are requesting clinical updates.  I have updated our CM and LCSW and have given them the contact information for Medicaid and DSS workers.Sheliah Hatch Davis/Medicaid Case #[id number] Telephone number [phone]  [name]/disability in [place] [phone]    Code Status: DNR Family Communication: Stepmother Anita Davis 1/10 who has been updated on likelihood that patient will require discharge to a state psychiatric facility given the complexity of her underlying psychiatric issue and significant behavioral issues DVT prophylaxis: Consistently ambulatory so no DVT prophylaxis  indicated Vaccination status: Fully vaccinated against Covid with second vaccine given on 02/23/2020  Pertinent social history Patient has 3 children: Anita Davis and Anita Davis Anita Davis's father has primary custody and no one in the family knows where they reside With permission from patient's father and stepmother I contacted Anita Davis (telephone number [phone]) who has legal custody of Anita Davis and Anita Davis; at this juncture Anita Davis states that after extensive discussions with Anita Davis he no longer wishes to visit his mother in person.  Anita Davis is only 7 and it is not appropriate for him to visit his mother at this point.  Prior to the patient's acute illness Anita Davis states that Anita Davis had regular frequent supervised visits with these children.  The possibility of children recording video greetings to their mother has been discussed and she will need to discuss this with her husband before agreeing. 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  She presented to [facility] 3 times prior to admission.  On her 4th presentation she was admitted.  On 9/2 she was diagnosed with acute cystitis with hematuria and was discharged home on Keflex and Zofran.  9/9 she presented with anxiety and symptoms consistent with a panic attack.  She was discharged from the [facility] with prescription for Vistaril.  She again presented to the [facility] on 9/10  reporting significant anxiety and severe insomnia and stating the  Vistaril was not working.  She was given Ativan while in the [facility] and this appeared to improve her symptoms.  She was stable for discharge and instructed to take Benadryl at bedtime to assist with her insomnia.  On 9/13 patient brought to the [facility] by family.  She was refusing to answer questions.  Clinician documented sores/lesions on her legs consistent with methamphetamine use.  When questioned regarding this patient reportedly just stared at the clinician.  It was later determined that the patient had been abusing substances (methamphetamine).  Prior to discharge patient appeared to be hallucinating but was able to verbally communicate.  Over the next hour she became more agitated and was given Ativan 2 mg orally. She later returned to her baseline mentation stating she did not wish to hurt herself or anybody else and requested to be discharged home. She was referred to Anita Davis and discharged by EDP.  Later that same day patient was returned to the [facility] by the police.  Patient was found laying down in a parking lot and acting bizarre.  She apparently told the police that she had put methamphetamines in her pants.  Patient kept reaching for vagina and it was suspected she may have placed drugs intravaginally.  No drugs were ever found.  EEG and MRI were completed with no acute abnormalities; urine drug screen positive for THC as well as BZDs (which had been given in the ED).  She underwent a lumbar puncture which was negative and was empirically started on antibiotics to treat potential infectious etiology to her encephalopathy.  After admission this patient developed seizure activity which progressed to status epilepticus therefore she was transferred to [facility] for further neurological evaluation.  After several days recurrent seizures were aborted with combination of 3 antiepileptic drugs.  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  Unfortunately she did not improve.  Because of persistent altered mentation and inability to sustain appropriate oral intake a PEG tube was placed.  This tube was eventually discontinued after patient became alert and awake enough to eat consistently during the latter portion of the hospitalization.  Since admission patient has had varying levels of awakeness and orientation as well as labile mood and affect.  Essentially for the first 8 weeks of hospitalization patient would have brief episodes of being awake, sometimes oriented sometimes not.  These episodes would last for several minutes to several hours but were never sustained.  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  Multiple psychotropic medications were tried without success or caused significant side effects and were discontinued.  Decision made to taper and wean all psychotropic drugs.  After this was done patient became extremely alert and agitated.  She was very impulsive and was demonstrating hypersexual behavior as well as extreme mood swings.  Seroquel tried initially to manage brain injury sequela but required high dosages unfortunately contributing to patient's depression and  suicidal ideation.  It was noted that each time patient was given IM Geodon that she became alert and for the most part oriented, was able to consistently communicate and perform ADLs.  Eventually she was transitioned to oral Geodon.  Naltrexone had been started to assist with underlying hypersexual behaviors and impulsivity and to treat her underlying heroin addiction since she had not had any methadone or heroin since admission.  She has also been placed on Klonopin and clonidine to aid with her severe anxiety.  Beginning on Christmas Eve patient began having extreme outbursts of agitation and anger requiring restraints and IVC.  Psychiatry and neurology reconsulted.  Repeat MRI  unremarkable and it is felt current behaviors are combination of brain injury sequela as well as underlying psychiatric issues from chronic drug abuse.  As of 1/7 all psychiatric medications have essentially been maxed out (see below).  Recommendation from psychiatrist to seek long-term placement at a psychiatric facility  1/10: Over the weekend this patient had significant delusional thought patterns and aggressive/violent behaviors as follows (documented interactions and statements made by patient over the past 48 hours): " You are the devil", "kill me, I know I am in a simulation. End the simulation right now", yelling at staff "you cannot be from a different religion" and ordering staff members to "shoot her dead"; grabbed a call bell and reported to staff "I am going to choke myself" forcing staff to remove call bell from room.  Told staff member "I have already killed Maralyn SagoSarah".  Patient also reported to her sitter that the staff is "eating her brains".  She repeatedly tries to take the scanner off of the portable computer stating "supposed to be there helping Gunnar Fusiaula" and points at the computer screen.  Continued to have variable behavior throughout the weekend alternating between calm (although confused) and agitated and abusive with delusional thoughts as above.  Reported to tech "I do not want you in here.  I want a normal tech who is not a liar".  Subsequently she put on her safety mittens and raised her arms above her head while lying in bed saying "he is not dead.  If I am the [place] he is not dead.  I am [name].  Dad if I am going down you are going down with me.  Every hoodwinker."  Later she attempted to leave and became agitated when she was informed that she was under IVC.  She threw her coloring books and crayons in the room.  She reported that she did not care and that she "knows Josh in the [employer] office".  Later she became distressed and threw her tablet across the room becoming very upset  and crying and writhing in the bed stating "I am already dead.  So it does not matter.  I am dead".  1 hour later she walked out into the hallway demanding to go for a walk but was redirected back to her room.  Several hours later she was sobbing and pacing in her room reporting that she is "Judas" and requested to see her IVC paperwork.  6 hours later she was pleasant and requesting applesauce and chocolate ice cream.  She was very pleasant and thanked the staff.  She was apologetic for her behaviors and abusive language earlier in the shift.  She walked out to the nursing unit obtained coffee with assistance of tech.  She subsequently was listening and singing to gospel music on her tablet.  She organized her belongings and  threw away old food and napkins from her bedside table.  Subjective: Awake with significantly flat affect.  Patient wiggling with purposeful behavior in the bed but once patient directly spoken to and my hand placed on her arm this behavior stopped  Objective: Vitals:   05/02/20 0621 05/02/20 0627  BP: 130/90 137/70  Pulse: 73 74  Resp: 17 17  Temp: 97.6 F (36.4 C) 97.6 F (36.4 C)  SpO2: 98% 97%    Intake/Output Summary (Last 24 hours) at 05/02/2020 0800 Last data filed at 05/02/2020 0343 Gross per 24 hour  Intake --  Output 1 ml  Net -1 ml   Filed Weights   03/29/20 0500 04/04/20 0632 04/05/20 0613  Weight: 72.5 kg 70.9 kg 71.6 kg    Exam: General: Awake flat affect and short responses to questions asked. Pulmonary:  Anterior lung sounds clear to auscultation, no increased work of breathing, stable on room air Cardiac:  Heart sounds are normal S1-S2 without any murmurs, no peripheral edema, no resting tachycardia Abdomen: Soft, nondistended.  Nontender.  Bowel sounds present. LBM 1/02 Neurological: Moves all extremities x4, no focal neurological deficits, no tremors or rigidity/EPS sx's Psychiatric: Awake, calm, oriented x name and place.  Answers questions  appropriately.     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  Also synthetic forms of stimulant/methamphetamine are usually not detected on standard drug screens. On 1/4 patient denied preadmission methamphetamine use (this is contradictory to additional history obtained both from the family and from the medical record) -Of note given her prior history of methamphetamine use it is uncertain if she has ever had a acute short-term psychosis associated with use of this drug.  This puts her at significant risk for developing recurrent psychosis with the reintroduction of stimulants and/or methamphetamine -Continue Geodon 80 mg BID (dose last increased on 1/3).  Anticipate could take 6 to 8 weeks after most recent dose increase before optimal therapeutic effect achieved -1/10 Continue naltrexone for impulsivity and hypersexuality; given recent issues with irritability and agitation will decrease dose from 50 mg to 25 mg daily.  If symptoms persist can dose on a every other day schedule and consider discontinuing but need to remember are also using to replace Methadone.  We have seen a consistent pattern that if patient is alert and sitting up in bed quietly early in the morning within 1 to 1.5 hours after being given a.m. medications she becomes more flat regarding affect and more terse in her responses.  This likely could be related to naltrexone therefore rationale for decrease in dose -Continue 1:1 safety sitter and restraints as needed  for aggressive behavior -In regards to her bizarre behavior she was exhibiting similar symptoms on date of admission and 24 hours previous to admission -Continue Remeron 30 mg HS added on 1/3   -Appreciate assistance of psych team. It appears we have maxed out on all psychotropic medications for this patient.  It is noted that many of these medications will take weeks to months for full therapeutic benefit.  Given her persistent severe behavioral swings that include violent episodes of physically attacking staff she is not a candidate for  an SNF and needs more aggressive therapies.  Psychiatry has recommended [facility] at [facility].  [facility] aware and is beginning this process.  -Patient does not have chronic seizure disorder but did have status epilepticus in September 2021 of indeterminate etiology.  She has been weaned off of AEDs.  Uncertain if she would benefit from ECT and uncertain if she would be a candidate given this seizure history -1/10: She did require a dose of IM Geodon at 9:29 AM today with previous dose on 1/7 at 8 PM  Status epilepticus -See above regarding suspected methamphetamine use prior to admission -Repeat MRI on 12/28 within normal limits and it is felt behavioral issues related to brain injury sequelae and chronic psychiatric condition.  Neurology has signed off -Vimpat, Dilantin and phenobarbital have been tapered and discontinued at recommendation of neurologist -Updated neurology regarding clarification of suspected preadmission history of possible excessive methamphetamine use as likely rationale for status epilepticus and she agreed.  Patient also reported had been without methadone for several weeks prior to admission and she could have been experiencing methadone withdrawal which could have precipitated seizure activity as well (especially if she were concurrently utilizing methamphetamine)  History of polysubstance abuse with heroin and  methamphetamine -Prescribed methadone 60 mg daily from a clinic in [place]  -Has not required methadone since arrival therefore we will continue naltrexone to treat heroin addiction in addition to impulsivity and hypersexual behaviors.  This is the last drug clinicians utilize to maintain stability after patients have been successful with methadone.  Would not return to methadone at this juncture if avoidable.    Depression and severe anxiety disorder/PTSD/Suspected undiagnosed bipolar disorder -Patient continues to demonstrate extremes in behavior: she is either calm and mostly appropriate although somewhat confused vs extremely withdrawn sad and crying vs extremely agitated and violent and is under IVC -1/10: Plan to taper Klonopin from 1.5 mg to 1 mg q 6hrs x4 days then decrease to 0.5 mg.  Benzodiazepines can cause increased irritability and agitation especially on higher doses -Continue Tegretol 600 mg TID (max dose)-recent LFTs stable -She was reevaluated by psychiatry who recommended can transition from Tegretol to Depakote if LFTs increase in the context of her underlying hepatitis C -Vistaril discontinued due to drug to drug interaction with Geodon -On 1/6 Lexapro discontinued in favor of Celexa by psychiatry team  Recurrent constipation -Continue Colace 100 mg twice daily prn -Continue MiraLAX to twice daily     Other problems: Nonobstructive transaminitis in context of hepatitis C -Elevated HCV antibody with markedly elevated HCV RNA quantitative level  consistent with chronic hepatitis C  -LFTs remain stable without an obstructive pattern noted -11/10: Discussed with ID Dr. Manson Passey. Treatment of hepatitis C typically is initiated in the outpatient setting. Medications can be quite expensive and usually take time to obtain insurance approval.  -ID recommends scheduling outpatient appointment their clinic closer to discharge date  Back pain 2/2 abnormal urinalysis  consistent with UTI -Patient with low-grade fever overnight and is now having back pain for days not responsive to NSAIDs and muscle relaxers -DG lumbar spine unremarkable -During hospitalization patient has been treated for pansensitive E. coli UTI as well as Salmonella UTI both of which have been sensitive to Levaquin -Completed Levaquin 12/20-urine culture obtained after antibiotics initiated and showed no growth -CT abdomen and pelvis on 12/17 unremarkable  Salmonella UTI -Completed amoxicillin  Abnormal urinalysis -Not consistent with UTI, was positive for moderate hemoglobin and greater than 50 RBCs with  an elevated specific gravity -Suspect dehydration -Patient denies back pain or history of renal calculi although given her confusion unclear if this history is accurate  Physical deconditioning -Resolved -Does continue to exhibit impulsivity but maintains appropriate balance and gait no longer requires one-to-one safety sitter  Large hemorrhoids. -Resolved -Markedly improved-LD Proctofoam 12/15  Dysphagia -Resolved -Continue regular diet   Data Reviewed: Basic Metabolic Panel: Recent Labs  Lab 04/28/20 0101 04/30/20 0128  NA 133* 136  K 4.0 4.1  CL 99 100  CO2 24 26  GLUCOSE 78 108*  BUN 13 10  CREATININE 0.66 0.66  CALCIUM 8.2* 9.0   Liver Function Tests: Recent Labs  Lab 04/28/20 0101  AST 66*  ALT 73*  ALKPHOS 134*  BILITOT 0.5  PROT 6.1*  ALBUMIN 3.4*   No results for input(s): LIPASE, AMYLASE in the last 168 hours. No results for input(s): AMMONIA in the last 168 hours. CBC: Recent Labs  Lab 04/30/20 0128  WBC 7.9  HGB 13.6  HCT 39.0  MCV 95.4  PLT 333   Cardiac Enzymes: No results for input(s): CKTOTAL, CKMB, CKMBINDEX, TROPONINI in the last 168 hours. BNP (last 3 results) No results for input(s): BNP in the last 8760 hours.  ProBNP (last 3 results) No results for input(s): PROBNP in the last 8760 hours.  CBG: No results for  input(s): GLUCAP in the last 168 hours.  No results found for this or any previous visit (from the past 240 hour(s)).   Studies: No results found.  Scheduled Meds: . carbamazepine  600 mg Oral TID  . Chlorhexidine Gluconate Cloth  6 each Topical Daily  . citalopram  20 mg Oral Daily  . clonazePAM  1.5 mg Oral Q6H  . cloNIDine  0.1 mg Transdermal Weekly  . diclofenac Sodium  4 g Topical QID  . docusate sodium  100 mg Oral BID  . feeding supplement  237 mL Oral TID BM  . Gerhardt's butt cream  1 application Topical TID  . hydrOXYzine  50 mg Intramuscular Once  . melatonin  5 mg Oral QHS  . mirtazapine  30 mg Oral QHS  . multivitamin with minerals  1 tablet Oral Daily  . naltrexone  50 mg Oral Daily  . polyethylene glycol  17 g Oral BID  . vitamin B-6  100 mg Oral Daily  . thiamine  100 mg Oral Daily  . ziprasidone  80 mg Oral 2 times per day   Continuous Infusions: . sodium chloride      Principal Problem:   Delirium due to multiple etiologies Active Problems:   Acute metabolic encephalopathy   Hypokalemia   Polysubstance abuse (HCC)   Elevated CK   Transaminitis   Refractory seizure (HCC)   Chronic hepatitis C without hepatic coma (HCC)   Distended abdomen   Palliative care encounter   Colonic Ileus (HCC)   Organic brain syndrome   On enteral nutrition   Physical deconditioning   Protein-calorie malnutrition (HCC)   Inadequate oral nutritional intake   Impulse disorder   Consultants:  Neurology  Psychiatry  Interventional radiology  Surgery    Procedures:  9/14 lumbar puncture  9/15 EEG  9/16 EEG  9/17 EEG  9/19 overnight EEG with video  9/22 overnight EEG with video with discontinuation of long-term EEG monitoring on 9/25  9/24 core track placement  10/6 EEG  12/15 PEG tube removed    Antibiotics: Anti-infectives (From admission, onward)   Start     Dose/Rate  Route Frequency Ordered Stop   04/05/20 1415  levofloxacin (LEVAQUIN)  tablet 500 mg        500 mg Oral Daily 04/05/20 1327 04/11/20 0911   03/19/20 1000  metroNIDAZOLE (FLAGYL) tablet 500 mg  Status:  Discontinued        500 mg Per Tube 2 times daily 03/19/20 0822 03/23/20 0827   03/18/20 1200  amoxicillin (AMOXIL) 250 MG/5ML suspension 500 mg  Status:  Discontinued        500 mg Per Tube Every 8 hours 03/18/20 0915 03/23/20 0559   03/17/20 1200  cefTRIAXone (ROCEPHIN) 1 g in sodium chloride 0.9 % 100 mL IVPB  Status:  Discontinued        1 g 200 mL/hr over 30 Minutes Intravenous Every 24 hours 03/17/20 1048 03/18/20 0913   03/16/20 1130  metroNIDAZOLE (FLAGYL) 50 mg/ml oral suspension 500 mg  Status:  Discontinued        500 mg Per Tube 2 times daily 03/16/20 1040 03/19/20 0821   03/16/20 1130  fluconazole (DIFLUCAN) 40 MG/ML suspension 152 mg        150 mg Per Tube  Once 03/16/20 1040 03/16/20 1245   02/11/20 1526  ceFAZolin (ANCEF) IVPB 2g/100 mL premix        2 g 200 mL/hr over 30 Minutes Intravenous  Once 02/11/20 1526 02/11/20 1641   02/05/20 0600  ceFAZolin (ANCEF) IVPB 2g/100 mL premix        2 g 200 mL/hr over 30 Minutes Intravenous To Short Stay 02/04/20 1054 02/05/20 0950   01/29/20 0600  ceFAZolin (ANCEF) IVPB 2g/100 mL premix        2 g 200 mL/hr over 30 Minutes Intravenous On call to O.R. 01/28/20 1511 01/30/20 0559   01/28/20 2000  cefTRIAXone (ROCEPHIN) 1 g in sodium chloride 0.9 % 100 mL IVPB        1 g 200 mL/hr over 30 Minutes Intravenous Every 24 hours 01/28/20 1925 02/02/20 0724   01/06/20 0900  cefTRIAXone (ROCEPHIN) 2 g in sodium chloride 0.9 % 100 mL IVPB  Status:  Discontinued        2 g 200 mL/hr over 30 Minutes Intravenous Every 12 hours 01/06/20 0105 01/06/20 2202   01/06/20 0800  vancomycin (VANCOCIN) IVPB 1000 mg/200 mL premix  Status:  Discontinued       "Followed by" Linked Group Details   1,000 mg 200 mL/hr over 60 Minutes Intravenous Every 12 hours 01/05/20 1904 01/06/20 2202   01/05/20 2000  vancomycin (VANCOREADY)  IVPB 1500 mg/300 mL       "Followed by" Linked Group Details   1,500 mg 150 mL/hr over 120 Minutes Intravenous  Once 01/05/20 1904 01/06/20 0127   01/05/20 1830  cefTRIAXone (ROCEPHIN) 2 g in sodium chloride 0.9 % 100 mL IVPB        2 g 200 mL/hr over 30 Minutes Intravenous  Once 01/05/20 1829 01/05/20 2220       Time spent: 25 minutes    [name] ANP  [facility] 7 am - 330 pm/M-F for direct patient care and secure chat Please see Amion for contact information 117 days

## 2020-05-02 NOTE — Progress Notes (Signed)
Pt awake requesting applesauce and chocolate ice cream. This NT got both. Pt was very pleasant and thanked staff

## 2020-05-02 NOTE — Progress Notes (Signed)
She has been crying for the past hour. She wants to see her kids and she thinks her dad should have just off her. She said she is going to hell and she hears everybody laughing at her. She doesn't like the scanner for the computers she feels it is something evil

## 2020-05-02 NOTE — Progress Notes (Addendum)
1:20pm: CSW received return call from Deanna at [facility] who states this patient has been added to the waiting list.  CSW attempted to reach [facility] worker at [facility] [place] - no extension available. CSW attempted to reach DSS supervisor Adair Patter at [phone] ext. [phone] - a voicemail was left requesting a return call.  8:30am: CSW spoke with admissions at [facility] - RN to review and return call to CSW.  Edwin Dada, MSW, LCSW-A Transitions of Care  Clinical Social Worker I [phone]

## 2020-05-02 NOTE — Progress Notes (Signed)
Pt awake and very apologetic for behaviors and abusive language earlier this shift. This NT walked with pt to get coffee. Pt appears to be happy and not agitated. Pt is now listening and singing gospel music on her tablet "waymaker" Pt has organized her belongings and thrown away and over flow of old food and napkins from her BS table. This NT has ordered pt's meals for the day

## 2020-05-02 NOTE — Progress Notes (Signed)
Pt agitated and becoming aggressive with the NT. Yelling and hitting the bed rails. PRM med given along with education. RN will monitor closely.

## 2020-05-03 MED ORDER — ENSURE ENLIVE PO LIQD
237.0000 mL | Freq: Two times a day (BID) | ORAL | Status: DC
  Administered 2020-05-04 – 2020-06-24 (×36): 237 mL via ORAL
  Filled 2020-05-03: qty 237

## 2020-05-03 MED ORDER — CLONAZEPAM 1 MG PO TABS
1.0000 mg | ORAL_TABLET | Freq: Four times a day (QID) | ORAL | Status: DC
  Administered 2020-05-03 – 2020-05-20 (×64): 1 mg via ORAL
  Filled 2020-05-03 (×65): qty 1

## 2020-05-03 NOTE — Progress Notes (Signed)
Nutrition Follow-up  DOCUMENTATION CODES:   Not applicable  INTERVENTION:   -DecreaseEnsure Enlive po toBID, each supplement provides 350 kcal and 20 grams of protein -Continue MVI with minerals daily -ContinueMagic cup TID with meals, each supplement provides 290 kcal and 9 grams of protein  -Given pt's medical stability, continued good nutritional intake, and wait for psychiatric facility, RD will sign off. If further nutritional needs are identified, please re-consult RD.   NUTRITION DIAGNOSIS:   Inadequate oral intake related to lethargy/confusion as evidenced by meal completion < 25%.  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 PMH includes hepatitis and polysubstance abuse.  9/24 Cortrak placed (gastric) 10/10 Cortrak dislodged, TF held 10/11 Cortrak advanced (tip in proximal duodenum per KUB), TF restarted 10/14 TF reduced to 71ml/hr due to colonic ileus 10/15 G-tube placement 10/16 TF resumed 10/18 TF rate returned to 75ml/hr; G-tube pulled out 1 inch so TF held 10/21 G-tube replaced 10/27 diet advanced to regular 12/15 PEG tube removed  Reviewed I/O's: +1.4 L x 24 hours and +5.5 L since 04/19/20  Pt resting in fetal position at foot of bed at time of visit. RD did not disturb.   Pt remains with good appetite. Noted meal completion consistently 50-100%. Pt is intermittently accepting of Ensure Enlive supplements.   Pt continues with IVC. Per TOC notes, pt is awaiting long-term discharge to inpatient psychaitric facility. She is currently on the waiting list at [facility].   Medications reviewed and include colace, melatonin, remeron, miralax, and vitamin B-6.   Given pt's medical stability, continued good nutritional intake, and wait for psychiatric facility, RD will sign off. If further nutritional needs are identified, please re-consult RD.   Labs reviewed.   Diet Order:   Diet Order            Diet regular Room service appropriate? No; Fluid consistency: Thin  Diet effective now                 EDUCATION NEEDS:   No education needs have been identified at this time  Skin:  Skin Assessment: Skin Integrity Issues: Skin Integrity Issues:: Incisions Incisions: lt upper abdomen  Last BM:  04/28/20  Height:   Ht Readings from Last 1 Encounters:  01/06/20 5\' 5"  (1.651 m)    Weight:   Wt Readings from Last 1 Encounters:  04/05/20 71.6 kg   BMI:  Body mass index is 26.27 kg/m.  Estimated Nutritional Needs:   Kcal:  1800-2000  Protein:  90-100 grams  Fluid:  >/=1.8L/d    04/07/20, RD, LDN, CDCES Registered Dietitian II Certified Diabetes Care and Education Specialist Please refer to [facility] for RD and/or RD on-call/weekend/after hours pager

## 2020-05-03 NOTE — Progress Notes (Signed)
[facility] PROGRESS NOTE  Anita Davis VXB:[id number] DOB: 1985/08/20 DOA: 01/04/2020 PCP: Anita Hawking, PA-C    02/10/20                      02/15/20                       04/08/20   Status: Remains inpatient appropriate because:Altered mental status, Unsafe d/c plan and Inpatient level of care appropriate due to severity of illness   Dispo: The patient is from: Home              Anticipated d/c is to: SNF              Anticipated d/c date is: > 3 days              Patient currently is medically stable to d/c.  Barriers to discharge; continued high severity psychiatric illness with waxing and waning mentation with violent outburst requiring IVC and anticipated discharge long-term to state psychiatric facility  ** 1/10 Early during the admission patient's aunt Anita Davis opted to pursue guardianship for the patient before clearing it with the rest of the family including the patient's father.  Subsequently she has received a significant volume of paperwork regarding Medicaid and disability applications which she has just now turned over to the patient's father.  Upon talking with the patient's stepmother she states based on her conversations with multiple providers with Medicaid and disability that they were led to believe that this patient was living in a private home with Anita Addison.  They are requesting clinical updates.  I have updated our CM and LCSW and have given them the contact information for Medicaid and DSS workers.Sheliah Hatch Davis/Medicaid Case #[id number] Telephone number [phone]  Anita Davis/disability in [place] [phone]    Code Status: DNR Family Communication: Stepmother Anita Davis 1/10 who has been updated on likelihood that patient will require discharge to a state psychiatric facility given the complexity of her underlying psychiatric issue and significant behavioral issues DVT prophylaxis: Consistently ambulatory so no DVT prophylaxis  indicated Vaccination status: Fully vaccinated against Covid with second vaccine given on 02/23/2020  Pertinent social history Patient has 3 children: Anita Davis and Anita Davis Anita Davis's father has primary custody and no one in the family knows where they reside With permission from patient's father and stepmother I contacted Anita Davis (telephone number [phone]) who has legal custody of Anita Davis and Anita Davis; at this juncture Mrs. Anita Davis states that after extensive discussions with Anita Davis he no longer wishes to visit his mother in person.  Anita Davis is only 7 and it is not appropriate for him to visit his mother at this point.  Prior to the patient's acute illness Mrs. Anita Davis states that Anita Davis had regular frequent supervised visits with these children.  The possibility of children recording video greetings to their mother has been discussed and she will need to discuss this with her husband before agreeing. Mrs. 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  She presented to [facility] ER 3 times prior to admission.  On her 4th presentation she was admitted.  On 9/2 she was diagnosed with acute cystitis with hematuria and was discharged home on Keflex and Zofran.  9/9 she presented with anxiety and symptoms consistent with a panic attack.  She was discharged from the ER with prescription for Vistaril.  She again presented to the ER on 9/10  reporting significant anxiety and severe insomnia and stating the  Vistaril was not working.  She was given Ativan while in the ER and this appeared to improve her symptoms.  She was stable for discharge and instructed to take Benadryl at bedtime to assist with her insomnia.  On 9/13 patient brought to the ER by family.  She was refusing to answer questions.  Clinician documented sores/lesions on her legs consistent with methamphetamine use.  When questioned regarding this patient reportedly just stared at the clinician.  It was later determined that the patient had been abusing substances (methamphetamine).  Prior to discharge patient appeared to be hallucinating but was able to verbally communicate.  Over the next hour she became more agitated and was given Ativan 2 mg orally. She later returned to her baseline mentation stating she did not wish to hurt herself or anybody else and requested to be discharged home. [facility] and [facility] by [facility].  Later that same day patient was returned to the ER by the police.  Patient was found laying down in a parking lot and acting bizarre.  She apparently told the police that she had put methamphetamines in her pants.  Patient kept reaching for vagina and it was suspected she may have placed drugs intravaginally.  No drugs were ever found.  EEG and MRI were completed with no acute abnormalities; urine drug screen positive for THC as well as BZDs (which had been given in the ED).  She underwent a lumbar puncture which was negative and was empirically started on antibiotics to treat potential infectious etiology to her encephalopathy.  After admission this patient developed seizure activity which progressed to status epilepticus therefore she was transferred to [facility] for further neurological evaluation.  After several days recurrent seizures were aborted with combination of 3 antiepileptic drugs.  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  Unfortunately she did not improve.  Because of persistent altered mentation and inability to sustain appropriate oral intake a PEG tube was placed.  This tube was eventually discontinued after patient became alert and awake enough to eat consistently during the latter portion of the hospitalization.  Since admission patient has had varying levels of awakeness and orientation as well as labile mood and affect.  Essentially for the first 8 weeks of hospitalization patient would have brief episodes of being awake, sometimes oriented sometimes not.  These episodes would last for several minutes to several hours but were never sustained.  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  Multiple psychotropic medications were tried without success or caused significant side effects and were discontinued.  Decision made to taper and wean all psychotropic drugs.  After this was done patient became extremely alert and agitated.  She was very impulsive and was demonstrating hypersexual behavior as well as extreme mood swings.  Seroquel tried initially to manage brain injury sequela but required high dosages unfortunately contributing to patient's depression and  suicidal ideation.  It was noted that each time patient was given IM Geodon that she became alert and for the most part oriented, was able to consistently communicate and perform ADLs.  Eventually she was transitioned to oral Geodon.  Naltrexone had been started to assist with underlying hypersexual behaviors and impulsivity and to treat her underlying heroin addiction since she had not had any methadone or heroin since admission.  She has also been placed on Klonopin and clonidine to aid with her severe anxiety.  Beginning on Christmas Eve patient began having extreme outbursts of agitation and anger requiring restraints and IVC.  Psychiatry and neurology reconsulted.  Repeat MRI  unremarkable and it is felt current behaviors are combination of brain injury sequela as well as underlying psychiatric issues from chronic drug abuse.  As of 1/7 all psychiatric medications have essentially been maxed out (see below).  Recommendation from psychiatrist to seek long-term placement at a psychiatric facility   Subjective: Awake this morning ambulating in room.  When she saw provider entered the room she handed me a piece of paper with a list of unintelligible words and told me that she wanted to get her toenails cut, wanted the doctor to stop tapering her Klonopin and told me "I am dead".  She later made a reference to Satan in the Bible finding this is why she is dead.  Objective: Vitals:   05/12/20 0630 05-12-2020 1226  BP: 115/83 124/83  Pulse: 63 65  Resp: 18 19  Temp: 98.1 F (36.7 C) 98.4 F (36.9 C)  SpO2: 99% 100%    Intake/Output Summary (Last 24 hours) at 2020-05-12 1435 Last data filed at May 12, 2020 1300 Gross per 24 hour  Intake 1480 ml  Output --  Net 1480 ml   Filed Weights   03/29/20 0500 04/04/20 0632 04/05/20 0613  Weight: 72.5 kg 70.9 kg 71.6 kg    Exam: General: Awake awake, slightly flat affect but more animated in her activity and interaction with staff.  Not combative and not abusive Pulmonary:  Stable on room air, lung sounds clear posteriorly Cardiac:  Pulse is regular and without tachycardia.  Heart sounds are normal, BP normal Abdomen: Soft, nondistended.  Nontender.  Bowel sounds present. LBM 1/06 Neurological: Moves all extremities x4, no focal neurological deficits, no tremors or rigidity/EPS sx's Psychiatric: Awake, calm, oriented x name and place not to year.  Answers questions appropriately.     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  Also synthetic forms of stimulant/methamphetamine are usually not detected on standard drug screens. On 1/4 patient denied preadmission methamphetamine use (this is contradictory to additional history obtained both from the family and from the medical record) -Of note given her prior history of methamphetamine use it is uncertain if she has ever had a acute short-term psychosis associated with use of this drug.  This puts her at significant risk for developing recurrent psychosis with the reintroduction of stimulants and/or methamphetamine -Continue Geodon 80 mg BID (dose last increased on 1/3).  Anticipate could take 6 to 8 weeks after most recent dose increase before optimal therapeutic effect achieved -Naltrexone decreased to  25 mg daily on 1/10 and patient with improvement in flat affect -Plans were to taper Klonopin down to 0.5 mg every 6 hours but patient asked that I leave it at current dose stating it is the only thing that is helping her while she is forced to stay here. -Continue 1:1 safety sitter and restraints as needed for aggressive behavior -In regards to her bizarre behavior she was exhibiting similar symptoms on date of admission and 24 hours previous to admission -Continue Remeron 30 mg HS added on 1/3   -Appreciate assistance of psych team. It appears we have maxed out on all psychotropic medications for this patient.  It is noted that many of these medications will take weeks to months for full therapeutic benefit.  Given her persistent severe behavioral swings that include violent episodes of physically attacking staff she is not a candidate for an SNF and needs more aggressive  therapies.  Psychiatry has recommended long-term placement at [facility].  [facility] aware and is beginning this process.  -Patient does not have chronic seizure disorder but did have status epilepticus in September 2021 of indeterminate etiology.  She has been weaned off of AEDs.  Uncertain if she would benefit from ECT and uncertain if she would be a candidate given this seizure history - Last dose of IM Geodon at 9:49 AM on 1/10  Status epilepticus -See above regarding suspected methamphetamine use prior to admission -Repeat MRI on 12/28 within normal limits and it is felt behavioral issues related to brain injury sequelae and chronic psychiatric condition.  Neurology has signed off -Vimpat, Dilantin and phenobarbital have been tapered and discontinued at recommendation of neurologist -Updated neurology regarding clarification of suspected preadmission history of possible excessive methamphetamine use as likely rationale for status epilepticus and she agreed.  Patient also reported had been without methadone for several weeks prior to admission and she could have been experiencing methadone withdrawal which could have precipitated seizure activity as well (especially if she were concurrently utilizing methamphetamine)  History of polysubstance abuse with heroin and methamphetamine -Prescribed methadone 60 mg daily from a clinic in [place]  -Has not required methadone since arrival therefore we will continue naltrexone to treat heroin addiction in addition to impulsivity and hypersexual behaviors.  This is the last drug clinicians utilize to maintain stability after patients have been successful with methadone.  Would not return to methadone at this juncture if avoidable.    Depression and severe anxiety disorder/PTSD/Suspected undiagnosed bipolar disorder -Patient continues to demonstrate extremes in behavior: she is either calm and mostly appropriate although somewhat confused vs  extremely withdrawn sad and crying vs extremely agitated and violent and is under IVC -Continue Klonopin as above-further tapering at this time -Continue Tegretol 600 mg TID (max dose)-recent LFTs stable -She was reevaluated by psychiatry who recommended can transition from Tegretol to Depakote if LFTs increase in the context of her underlying hepatitis C -Vistaril discontinued due to drug to drug interaction with Geodon -On 1/6 Lexapro discontinued in favor of Celexa by psychiatry team  Recurrent constipation -Continue Colace 100 mg twice daily prn -Continue MiraLAX to twice daily     Other problems: Nonobstructive transaminitis in context of hepatitis C -Elevated HCV antibody with markedly elevated HCV RNA quantitative level  consistent with chronic hepatitis C  -LFTs remain stable without an obstructive pattern noted -11/10: Discussed with ID Dr. Manson Passey. Treatment of hepatitis C typically is initiated in the outpatient setting. Medications can be quite expensive and usually take  time to obtain insurance approval.  -ID recommends scheduling outpatient appointment their clinic closer to discharge date  Back pain 2/2 abnormal urinalysis consistent with UTI -Patient with low-grade fever overnight and is now having back pain for days not responsive to NSAIDs and muscle relaxers -DG lumbar spine unremarkable -During hospitalization patient has been treated for pansensitive E. coli UTI as well as Salmonella UTI both of which have been sensitive to Levaquin -Completed Levaquin 12/20-urine culture obtained after antibiotics initiated and showed no growth -CT abdomen and pelvis on 12/17 unremarkable  Salmonella UTI -Completed amoxicillin  Abnormal urinalysis -Not consistent with UTI, was positive for moderate hemoglobin and greater than 50 RBCs with an elevated specific gravity -Suspect dehydration -Patient denies back pain or history of renal calculi although given her confusion  unclear if this history is accurate  Physical deconditioning -Resolved -Does continue to exhibit impulsivity but maintains appropriate balance and gait no longer requires one-to-one safety sitter  Large hemorrhoids. -Resolved -Markedly improved-LD Proctofoam 12/15  Dysphagia -Resolved -Continue regular diet   Data Reviewed: Basic Metabolic Panel: Recent Labs  Lab 04/28/20 0101 04/30/20 0128  NA 133* 136  K 4.0 4.1  CL 99 100  CO2 24 26  GLUCOSE 78 108*  BUN 13 10  CREATININE 0.66 0.66  CALCIUM 8.2* 9.0   Liver Function Tests: Recent Labs  Lab 04/28/20 0101  AST 66*  ALT 73*  ALKPHOS 134*  BILITOT 0.5  PROT 6.1*  ALBUMIN 3.4*   No results for input(s): LIPASE, AMYLASE in the last 168 hours. No results for input(s): AMMONIA in the last 168 hours. CBC: Recent Labs  Lab 04/30/20 0128  WBC 7.9  HGB 13.6  HCT 39.0  MCV 95.4  PLT 333   Cardiac Enzymes: No results for input(s): CKTOTAL, CKMB, CKMBINDEX, TROPONINI in the last 168 hours. BNP (last 3 results) No results for input(s): BNP in the last 8760 hours.  ProBNP (last 3 results) No results for input(s): PROBNP in the last 8760 hours.  CBG: No results for input(s): GLUCAP in the last 168 hours.  No results found for this or any previous visit (from the past 240 hour(s)).   Studies: No results found.  Scheduled Meds: . carbamazepine  600 mg Oral TID  . Chlorhexidine Gluconate Cloth  6 each Topical Daily  . citalopram  20 mg Oral Daily  . clonazePAM  1 mg Oral Q6H  . cloNIDine  0.1 mg Transdermal Weekly  . diclofenac Sodium  4 g Topical QID  . docusate sodium  100 mg Oral BID  . feeding supplement  237 mL Oral BID BM  . Gerhardt's butt cream  1 application Topical TID  . hydrOXYzine  50 mg Intramuscular Once  . melatonin  5 mg Oral QHS  . mirtazapine  30 mg Oral QHS  . multivitamin with minerals  1 tablet Oral Daily  . naltrexone  25 mg Oral Daily  . polyethylene glycol  17 g Oral BID   . vitamin B-6  100 mg Oral Daily  . thiamine  100 mg Oral Daily  . ziprasidone  80 mg Oral 2 times per day   Continuous Infusions: . sodium chloride      Principal Problem:   Delirium due to multiple etiologies Active Problems:   Acute metabolic encephalopathy   Hypokalemia   Polysubstance abuse (HCC)   Elevated CK   Transaminitis   Refractory seizure (HCC)   Chronic hepatitis C without hepatic coma (HCC)   Distended abdomen  Palliative care encounter   Colonic Ileus (HCC)   Organic brain syndrome   On enteral nutrition   Physical deconditioning   Protein-calorie malnutrition (HCC)   Inadequate oral nutritional intake   Impulse disorder   Consultants:  Neurology  Psychiatry  Interventional radiology  Surgery    Procedures:  9/14 lumbar puncture  9/15 EEG  9/16 EEG  9/17 EEG  9/19 overnight EEG with video  9/22 overnight EEG with video with discontinuation of long-term EEG monitoring on 9/25  9/24 core track placement  10/6 EEG  12/15 PEG tube removed    Antibiotics: Anti-infectives (From admission, onward)   Start     Dose/Rate Route Frequency Ordered Stop   04/05/20 1415  levofloxacin (LEVAQUIN) tablet 500 mg        500 mg Oral Daily 04/05/20 1327 04/11/20 0911   03/19/20 1000  metroNIDAZOLE (FLAGYL) tablet 500 mg  Status:  Discontinued        500 mg Per Tube 2 times daily 03/19/20 0822 03/23/20 0827   03/18/20 1200  amoxicillin (AMOXIL) 250 MG/5ML suspension 500 mg  Status:  Discontinued        500 mg Per Tube Every 8 hours 03/18/20 0915 03/23/20 0559   03/17/20 1200  cefTRIAXone (ROCEPHIN) 1 g in sodium chloride 0.9 % 100 mL IVPB  Status:  Discontinued        1 g 200 mL/hr over 30 Minutes Intravenous Every 24 hours 03/17/20 1048 03/18/20 0913   03/16/20 1130  metroNIDAZOLE (FLAGYL) 50 mg/ml oral suspension 500 mg  Status:  Discontinued        500 mg Per Tube 2 times daily 03/16/20 1040 03/19/20 0821   03/16/20 1130  fluconazole  (DIFLUCAN) 40 MG/ML suspension 152 mg        150 mg Per Tube  Once 03/16/20 1040 03/16/20 1245   02/11/20 1526  ceFAZolin (ANCEF) IVPB 2g/100 mL premix        2 g 200 mL/hr over 30 Minutes Intravenous  Once 02/11/20 1526 02/11/20 1641   02/05/20 0600  ceFAZolin (ANCEF) IVPB 2g/100 mL premix        2 g 200 mL/hr over 30 Minutes Intravenous To Short Stay 02/04/20 1054 02/05/20 0950   01/29/20 0600  ceFAZolin (ANCEF) IVPB 2g/100 mL premix        2 g 200 mL/hr over 30 Minutes Intravenous On call to O.R. 01/28/20 1511 01/30/20 0559   01/28/20 2000  cefTRIAXone (ROCEPHIN) 1 g in sodium chloride 0.9 % 100 mL IVPB        1 g 200 mL/hr over 30 Minutes Intravenous Every 24 hours 01/28/20 1925 02/02/20 0724   01/06/20 0900  cefTRIAXone (ROCEPHIN) 2 g in sodium chloride 0.9 % 100 mL IVPB  Status:  Discontinued        2 g 200 mL/hr over 30 Minutes Intravenous Every 12 hours 01/06/20 0105 01/06/20 2202   01/06/20 0800  vancomycin (VANCOCIN) IVPB 1000 mg/200 mL premix  Status:  Discontinued       "Followed by" Linked Group Details   1,000 mg 200 mL/hr over 60 Minutes Intravenous Every 12 hours 01/05/20 1904 01/06/20 2202   01/05/20 2000  vancomycin (VANCOREADY) IVPB 1500 mg/300 mL       "Followed by" Linked Group Details   1,500 mg 150 mL/hr over 120 Minutes Intravenous  Once 01/05/20 1904 01/06/20 0127   01/05/20 1830  cefTRIAXone (ROCEPHIN) 2 g in sodium chloride 0.9 % 100 mL IVPB  2 g 200 mL/hr over 30 Minutes Intravenous  Once 01/05/20 1829 01/05/20 2220       Time spent: 25 minutes    Junious SilkAllison Roshun Klingensmith ANP  [facility] 7 am - 330 pm/M-F for direct patient care and secure chat Please see Amion for contact information 118 days

## 2020-05-04 MED ORDER — ZOLPIDEM TARTRATE 5 MG PO TABS: 5.0000 mg | ORAL_TABLET | Freq: Every evening | ORAL | Status: DC | PRN

## 2020-05-04 MED ORDER — DIPHENHYDRAMINE HCL 25 MG PO CAPS: 50.0000 mg | ORAL_CAPSULE | Freq: Every evening | ORAL | Status: DC | PRN

## 2020-05-04 MED ORDER — MELATONIN 5 MG PO TABS
10.0000 mg | ORAL_TABLET | Freq: Every day | ORAL | Status: DC
  Administered 2020-05-04 – 2020-07-01 (×59): 10 mg via ORAL
  Filled 2020-05-04 (×58): qty 2

## 2020-05-04 NOTE — Progress Notes (Signed)
Occupational Therapy Treatment [name] MRN: [id number] DOB: 1985/06/02 Today's Date: 05/04/2020    History of present illness 35 year old with past medical history significant for hepatitis C, polysubstance abuse brought to the hospital 9/13 for disorientation which was suspected to be substance abuse.  After she became more lucid, she was discharged, but then police brought her back later in the day and they found her passed out in a parking lot.  Urine drug screen was positive for benzodiazepine and THC. An EEG done on 9/15 showed patient was in status epilepticus.  Patient was a started on Keppra.  Despite these, EEG noted continued seizures requiring additional medications as well.  Finally on 9/18, seizures broke. Follow-up EEG on 9/20 noted evidence of epilepticity from the left central temporal region.  Repeat EEG done on 9/22 noted epileptogenicity from the left central temporal region.  Pt with PEG placed on 02/05/20 removed on 12/15   OT comments  Pt making excellent progress towards OT goals this session. Pt up at sink finishing dying her hair upon OTA arrival independently. Pt and pts family report she is having a good day. Pt recalls seeing this OTA last session and reports she has been using the schedules given to her last session to keep track of her day. Pt reports having already done yoga on youtube this AM and plans to do another session later tonight. Pt ambulating around room gathering ADL items to finish dying her hair independently with no LOB. Pt even changed her outfit during session in standing with no LOB. DC plan remains appropriate, will continue to follow pt acutely per POC.   Follow Up Recommendations  SNF    Equipment Recommendations  None recommended by OT    Recommendations for Other Services      Precautions / Restrictions Precautions Precautions: Fall Precaution Comments: seizures, safety sitter Restrictions Weight Bearing  Restrictions: No       Mobility Bed Mobility Overal bed mobility: Independent             General bed mobility comments: pt getting in and out of bed multiple times duirng session with no LOB  Transfers Overall transfer level: Independent               General transfer comment: pt completing multiple sit<>stands from various surfaces during session with no LOB    Balance Overall balance assessment: No apparent balance deficits (not formally assessed)                                         ADL either performed or assessed with clinical judgement   ADL Overall ADL's : Needs assistance/impaired     Grooming: Standing;Supervision/safety Grooming Details (indicate cue type and reason): pt dying her hair in the sink rinising out color upon arrival, Upper Body Bathing: Independent Upper Body Bathing Details (indicate cue type and reason): washing hair independently     Upper Body Dressing : Independent;Standing Upper Body Dressing Details (indicate cue type and reason): changed clothes in BR Lower Body Dressing: Independent;Sitting/lateral leans Lower Body Dressing Details (indicate cue type and reason): changed clothes in BR Toilet Transfer: Independent;Ambulation Toilet Transfer Details (indicate cue type and reason): ambulating around room with no physical assistance or AD         Functional mobility during ADLs: Independent General ADL Comments: pt up in room finising dying her hair  upon arrival, changing her outfit to show her family and reports doing yoga earlier in day     Vision       Perception     Praxis      Cognition Arousal/Alertness: Awake/alert Behavior During Therapy: WFL for tasks assessed/performed Overall Cognitive Status: Within Functional Limits for tasks assessed                                 General Comments: pt very pleasant during session. pts family present reporting that she is still has  "interesting" thoughts telling her family that she thinks she is schizophrenic. pt reports she has been using the schedules this OTA gave to her last session to help keep track of her day. pts family reports she  is having a "really good day". pt family concerned that she has figured out how to get on facebook on her ipad ( but impressed by her problem solving skills) issued pt some problem solving puzzles where pt instructed to cross out numbers in correct sequence and locate matching numbers out of various options to challenge her attention to task and problem solving skills and facilitate pathfinding task        Exercises     Shoulder Instructions       General Comments pt family present during session very supportive and glad she is having a good day.    Pertinent Vitals/ Pain       Pain Assessment: No/denies pain  Home Living                                          Prior Functioning/Environment              Frequency  Min 1X/week        Progress Toward Goals  OT Goals(current goals can now be found in the care plan section)  Progress towards OT goals: Progressing toward goals  Acute Rehab OT Goals Patient Stated Goal: wanting the purewick from sitter as OT leaving room OT Goal Formulation: With patient Time For Goal Achievement: 05/06/20 Potential to Achieve Goals: Good  Plan Discharge plan remains appropriate;Frequency remains appropriate    Co-evaluation                 AM-PAC OT "6 Clicks" Daily Activity     Outcome Measure   Help from another person eating meals?: None Help from another person taking care of personal grooming?: None Help from another person toileting, which includes using toliet, bedpan, or urinal?: None Help from another person bathing (including washing, rinsing, drying)?: None Help from another person to put on and taking off regular upper body clothing?: None Help from another person to put on and taking off  regular lower body clothing?: None 6 Click Score: 24    End of Session    OT Visit Diagnosis: Unsteadiness on feet (R26.81);Muscle weakness (generalized) (M62.81);Pain;Other symptoms and signs involving the nervous system (R29.898);Other symptoms and signs involving cognitive function;Cognitive communication deficit (R41.841);Ataxia, unspecified (R27.0);Feeding difficulties (R63.3);Other abnormalities of gait and mobility (R26.89)   Activity Tolerance Patient tolerated treatment well   Patient Left in bed;with call bell/phone within reach;with family/visitor present;with nursing/sitter in room   Nurse Communication Mobility status        Time: 6378-5885 OT Time Calculation (min): 21 min  Charges:  OT General Charges $OT Visit: 1 Visit OT Treatments $Self Care/Home Management : 8-22 mins  Lenor Derrick., COTA/L Acute Rehabilitation Services [phone]    Barron Schmid 05/04/2020, 4:48 PM

## 2020-05-04 NOTE — Plan of Care (Signed)
  Problem: Activity: Goal: Risk for activity intolerance will decrease Outcome: Progressing   Problem: Nutrition: Goal: Adequate nutrition will be maintained Outcome: Progressing   Problem: Coping: Goal: Level of anxiety will decrease Outcome: Progressing   Problem: Elimination: Goal: Will not experience complications related to bowel motility Outcome: Progressing Goal: Will not experience complications related to urinary retention Outcome: Progressing   Problem: Pain Managment: Goal: General experience of comfort will improve Outcome: Progressing   Problem: Safety: Goal: Ability to remain free from injury will improve Outcome: Progressing   Problem: Education: Goal: Expressions of having a comfortable level of knowledge regarding the disease process will increase Outcome: Progressing   Problem: Coping: Goal: Ability to adjust to condition or change in health will improve Outcome: Progressing Goal: Ability to identify appropriate support needs will improve Outcome: Progressing   Problem: Health Behavior/Discharge Planning: Goal: Compliance with prescribed medication regimen will improve Outcome: Progressing   Problem: Medication: Goal: Risk for medication side effects will decrease Outcome: Progressing   Problem: Self-Concept: Goal: Level of anxiety will decrease Outcome: Progressing Goal: Ability to verbalize feelings about condition will improve Outcome: Progressing

## 2020-05-04 NOTE — Progress Notes (Signed)
Pt is tearful asking for [name]. Pt states "Gunnar Fusi and Verlon Au need to pull her out of here.' Pt states there is an Aids epidemic, and that she is in the morgue.'Pt states she 'see's her horn and her Mom is underneath her.' Pt continues to sob stating "pull me out of here."

## 2020-05-04 NOTE — Progress Notes (Addendum)
[facility] PROGRESS NOTE  Doneen Poissonmanda Squibb ZOX:[id number] DOB: [birth year] DOA: 01/04/2020 PCP: Jacquelin HawkingMcElroy, Shannon, PA-C    02/10/20                      02/15/20                       04/08/20   Status: Remains inpatient appropriate because:Altered mental status, Unsafe d/c plan and Inpatient level of care appropriate due to severity of illness   Dispo: The patient is from: Home              Anticipated d/c is to: SNF              Anticipated d/c date is: > 3 days              Patient currently is medically stable to d/c.  Barriers to discharge; continued high severity psychiatric illness with waxing and waning mentation with violent outburst requiring IVC and anticipated discharge long-term to state psychiatric facility  **As of 1/12 patient has remained alert and appropriate through most of the day.  Discussed with patient as well as family that if patient is able to maintain appropriate mentation and mood for the next 2 weeks there is a potential we could avoid placement at [facility] and that she may be appropriate for discharge home with family if they feel they are able to manage the patient.  Behaviors improving if psych at this facility feels patient will continue to need some inpatient treatment we can revisit potential [facility] if beds are available.  ** 1/10 Early during the admission patient's aunt Jeanice LimKatie Clinard opted to pursue guardianship for the patient before clearing it with the rest of the family including the patient's father.  Subsequently she has received a significant volume of paperwork regarding Medicaid and disability applications which she has just now turned over to the patient's father.  Upon talking with the patient's stepmother she states based on her conversations with multiple providers with Medicaid and disability that they were led to believe that this patient was living in a private home with Florentina AddisonKatie.  They are  requesting clinical updates.  I have updated our CM and LCSW and have given them the contact information for Medicaid and DSS workers.Sheliah Hatch**  Anitura Martin/Medicaid Case #[id number] Telephone number [phone]  [name]/disability in [place] [phone]    Code Status: DNR Family Communication: Stepmother Bonita QuinLinda 1/10 who has been updated on likelihood that patient will require discharge to a state psychiatric facility given the complexity of her underlying psychiatric issue and significant behavioral issues DVT prophylaxis: Consistently ambulatory so no DVT prophylaxis indicated Vaccination status: Fully vaccinated against Covid with second vaccine given on 02/23/2020  Pertinent social history Patient has 3 children: Grace Bushyremaine, [name] and [name] [name]'s father has primary custody and no one in the family knows where they reside With permission from patient's father and stepmother I contacted Charlean MerlLeslie Lowe (telephone number [phone]) who has legal custody of [name] and [name]; at this juncture Mrs. Rana SnareLowe states that after extensive discussions with [name] he no longer wishes to visit his mother in person.  [name] is only 7 and it is not appropriate for him to visit his mother at this point.  Prior to the patient's acute illness Mrs. Rana SnareLowe states that Marchelle Folksmanda had regular frequent supervised visits with these children.  The possibility of children recording video greetings to their  mother has been discussed and she will need to discuss this with her husband before agreeing. 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  She presented to [facility] ER 3 times prior to admission.  On her 4th presentation she was admitted.  On 9/2 she was diagnosed with acute cystitis with hematuria and was discharged home on Keflex and Zofran.  9/9 she presented with anxiety and symptoms consistent with a panic attack.  She was discharged from the ER with prescription for Vistaril.  She again presented to the ER on 9/10  reporting significant anxiety and severe insomnia and stating the Vistaril was not working.  She was given Ativan while in the ER and this appeared to improve her symptoms.  She was stable for discharge and instructed to take Benadryl at bedtime to assist with her insomnia.  On 9/13 patient brought to the ER by family.  She was refusing to answer questions.  Clinician documented sores/lesions on her legs consistent with methamphetamine use.  When questioned regarding this patient reportedly just stared at the clinician.  It was later determined that the patient had been abusing substances (methamphetamine).  Prior to discharge patient appeared to be hallucinating but was able to verbally communicate.  Over the next hour she became more agitated and was given Ativan 2 mg orally. She later returned to her baseline mentation stating she did not wish to hurt herself or anybody else and requested to be discharged home. She was referred to [facility] and discharged by EDP.  Later that same day patient was returned to the ER by the police.  Patient was found laying down in a parking lot and acting bizarre.  She apparently told the police that she had put methamphetamines in her pants.  Patient kept reaching for vagina and it was suspected she may have placed drugs intravaginally.  No drugs were ever found.  EEG and MRI were completed with no acute abnormalities; urine drug  screen positive for THC as well as BZDs (which had been given in the ED).  She underwent a lumbar puncture which was negative and was empirically started on antibiotics to treat potential infectious etiology to her encephalopathy.  After admission this patient developed seizure activity which progressed to status epilepticus therefore she was transferred to [facility] for further neurological evaluation.  After several days recurrent seizures were aborted with combination of 3 antiepileptic drugs.  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  Unfortunately she did not improve.  Because of persistent altered mentation and inability to sustain appropriate oral intake a PEG tube was placed.  This tube was eventually discontinued after patient became alert and awake enough to eat consistently during the latter portion of the hospitalization.  Since admission patient has had varying levels of awakeness and orientation as well as labile mood and affect.  Essentially for the first 8 weeks of hospitalization patient would have brief episodes of being awake, sometimes oriented  sometimes not.  These episodes would last for several minutes to several hours but were never sustained.  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  Multiple psychotropic medications were tried without success or caused significant side effects and were discontinued.  Decision made to taper and wean all psychotropic drugs.  After this was done patient became extremely alert and agitated.  She was very impulsive and was demonstrating hypersexual behavior as well as extreme mood swings.  Seroquel tried initially to manage brain injury sequela but required high dosages unfortunately contributing to patient's depression and suicidal ideation.  It was noted that each time patient was given IM Geodon  that she became alert and for the most part oriented, was able to consistently communicate and perform ADLs.  Eventually she was transitioned to oral Geodon.  Naltrexone had been started to assist with underlying hypersexual behaviors and impulsivity and to treat her underlying heroin addiction since she had not had any methadone or heroin since admission.  She has also been placed on Klonopin and clonidine to aid with her severe anxiety.  Beginning on Christmas Eve patient began having extreme outbursts of agitation and anger requiring restraints and IVC.  Psychiatry and neurology reconsulted.  Repeat MRI unremarkable and it is felt current behaviors are combination of brain injury sequela as well as underlying psychiatric issues from chronic drug abuse.  As of 1/7 all psychiatric medications have essentially been maxed out (see below).  Recommendation from psychiatrist to seek long-term placement at a psychiatric facility  As of 1/12 patient very appropriate, interactive and oriented x3.  Has not had any delusional thoughts or statements.  No hallucinations.  Mood is happy.  She states "today I am [name] but there have been a lot of days lately that I have not been [place]".   Subjective: Awake and alert this morning.  Showed me a written list of questions and needs.  These were addressed.  Patient requesting Ambien for sleep.  Can sleep all night.  Again reiterates that she has a lot of anxiety and wishes that we do not further taper her Klonopin if possible.  Asking if she can see a dentist.  Explained to her that unless she has an abscess or other urgent surgical issue that dentist would need to wait into the outpatient setting.  Subsequently I reviewed imaging and remembered that she had been complaining earlier during one of her lucid phases and I had obtained a maxillofacial 12/23 which revealed dental caries but no abscess or other signs of infection  In regards to visitor she is requesting that her  boss and another coworker be allowed to visit her.  Informed her that the only person at this time that is not allowed to visit is Mr. Mia Creek.  She states that Mr. Mia Creek is her boss and he has not harmed her at all.  Told her that her stepmother and her aunt [name] will be visiting today and she needs to explain all of this to them since it was their recommendation that Mr. Mia Creek not visit.  Objective: Vitals:   05/04/20 0615 05/04/20 1045  BP: (!) 119/99 132/76  Pulse: 68 72  Resp: 18 16  Temp: 97.8 F (36.6 C) 98.4 F (36.9 C)  SpO2: 99% 98%    Intake/Output Summary (Last 24 hours) at 05/04/2020 1424 Last data filed at 05/04/2020 0658 Gross per 24 hour  Intake 480 ml  Output --  Net 480  ml   Filed Weights   03/29/20 0500 04/04/20 0632 04/05/20 0613  Weight: 72.5 kg 70.9 kg 71.6 kg    Exam: General: Awake awake, slightly flat affect but more animated in her activity and interaction with staff.  Not combative and not abusive Pulmonary:  Stable on room air, lung sounds clear posteriorly Cardiac:  Pulse is regular and without tachycardia.  Heart sounds are normal, BP normal Abdomen: Soft, nondistended.  Nontender.  Bowel sounds present. LBM 1/06 Neurological: Moves all extremities x4, no focal neurological deficits, no tremors or rigidity/EPS sx's Psychiatric: Awake, calm, oriented x name and place not to year.  Answers questions appropriately.     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  Also synthetic forms of  stimulant/methamphetamine are usually not detected on standard drug screens. On 1/4 patient denied preadmission methamphetamine use (this is contradictory to additional history obtained both from the family and from the medical record) -Of note given her prior history of methamphetamine use it is uncertain if she has ever had a acute short-term psychosis associated with use of this drug.  This puts her at significant risk for developing recurrent psychosis with the reintroduction of stimulants and/or methamphetamine -Continue Geodon 80 mg BID (dose last increased on 1/3).  Anticipate could take 6 to 8 weeks after most recent dose increase before optimal therapeutic effect achieved -Naltrexone decreased to 25 mg daily on 1/10 with significant improvement in behavior and affect subsequently -Plans were to taper Klonopin down to 0.5 mg every 6 hours but patient asked that I leave it at current dose stating it is the only thing that is helping her while she is forced to stay here. -Continue 1:1 safety sitter and restraints as needed for aggressive behavior -In regards to her bizarre behavior she was exhibiting similar symptoms on date of admission and 24 hours previous to admission -Continue Remeron 30 mg HS added on 1/3   -Appreciate assistance of psych team. It appears we have maxed out on all psychotropic medications for this patient.  It is noted that many of these medications will take weeks to months for full therapeutic benefit.  Given her persistent severe behavioral swings that include violent episodes of physically attacking staff she is not a candidate for an SNF and needs more aggressive therapies.  Psychiatry has recommended long-term placement at [facility].  [facility] aware and is beginning this process.  -Patient does not have chronic seizure disorder but did have status epilepticus in September 2021 of indeterminate etiology.  She has been weaned off of AEDs.  Uncertain if she would  benefit from ECT and uncertain if she would be a candidate given this seizure history - Last dose of IM Geodon at 9:49 AM on 1/10 -1/12: Has been very stable today regarding mentation and behavior.  Status epilepticus -See above regarding suspected methamphetamine use prior to admission -Repeat MRI on 12/28 within normal limits and it is felt behavioral issues related to brain injury sequelae and chronic psychiatric condition.  Neurology has signed off -Vimpat, Dilantin and phenobarbital have been tapered and discontinued at recommendation of neurologist -Updated neurology regarding clarification of suspected preadmission  history of possible excessive methamphetamine use as likely rationale for status epilepticus and she agreed.  Patient also reported had been without methadone for several weeks prior to admission and she could have been experiencing methadone withdrawal which could have precipitated seizure activity as well (especially if she were concurrently utilizing methamphetamine)  History of polysubstance abuse with heroin and methamphetamine -Prescribed methadone 60 mg daily from a clinic in [place]  -Has not required methadone since arrival therefore we will continue naltrexone to treat heroin addiction in addition to impulsivity and hypersexual behaviors.  This is the last drug clinicians utilize to maintain stability after patients have been successful with methadone.  Would not return to methadone at this juncture if avoidable.    Depression and severe anxiety disorder/PTSD/no bipolar disorder now with schizoaffective features -For the past several weeks she experienced extremes in behavior: she was either calm and mostly appropriate but mildly confused vs extremely withdrawn sad and crying vs extremely agitated and violent.  Because of this she remains under IVC -Continue Klonopin as above-further tapering at this time -Continue Tegretol 600 mg TID (max dose)-recent LFTs  stable -She was reevaluated by psychiatry who recommended can transition from Tegretol to Depakote if LFTs increase in the context of her underlying hepatitis C -Vistaril discontinued due to drug to drug interaction with Geodon -On 1/6 Lexapro discontinued in favor of Celexa by psychiatry team -See above regarding significant improvement as of 1/12 in mentation and behavior.  No delusional thoughts.  No apparent hallucinations.  No anger or violent outburst. -On 1/12 patient did confirm to me that she has previously been diagnosed with bipolar disorder  Recurrent constipation -Continue Colace 100 mg twice daily prn -Continue MiraLAX to twice daily     Other problems: Nonobstructive transaminitis in context of hepatitis C -Elevated HCV antibody with markedly elevated HCV RNA quantitative level  consistent with chronic hepatitis C  -LFTs remain stable without an obstructive pattern noted -11/10: Discussed with ID Dr. Manson Passey. Treatment of hepatitis C typically is initiated in the outpatient setting. Medications can be quite expensive and usually take time to obtain insurance approval.  -ID recommends scheduling outpatient appointment their clinic closer to discharge date  Back pain 2/2 abnormal urinalysis consistent with UTI -Patient with low-grade fever overnight and is now having back pain for days not responsive to NSAIDs and muscle relaxers -DG lumbar spine unremarkable -During hospitalization patient has been treated for pansensitive E. coli UTI as well as Salmonella UTI both of which have been sensitive to Levaquin -Completed Levaquin 12/20-urine culture obtained after antibiotics initiated and showed no growth -CT abdomen and pelvis on 12/17 unremarkable  Salmonella UTI -Completed amoxicillin  Abnormal urinalysis -Not consistent with UTI, was positive for moderate hemoglobin and greater than 50 RBCs with an elevated specific gravity -Suspect dehydration -Patient denies  back pain or history of renal calculi although given her confusion unclear if this history is accurate  Physical deconditioning -Resolved -Does continue to exhibit impulsivity but maintains appropriate balance and gait no longer requires one-to-one safety sitter  Large hemorrhoids. -Resolved -Markedly improved-LD Proctofoam 12/15  Dysphagia -Resolved -Continue regular diet   Data Reviewed: Basic Metabolic Panel: Recent Labs  Lab 04/28/20 0101 04/30/20 0128  NA 133* 136  K 4.0 4.1  CL 99 100  CO2 24 26  GLUCOSE 78 108*  BUN 13 10  CREATININE 0.66 0.66  CALCIUM 8.2* 9.0   Liver Function Tests: Recent Labs  Lab 04/28/20 0101  AST 66*  ALT 73*  ALKPHOS 134*  BILITOT 0.5  PROT 6.1*  ALBUMIN 3.4*   No results for input(s): LIPASE, AMYLASE in the last 168 hours. No results for input(s): AMMONIA in the last 168 hours. CBC: Recent Labs  Lab 04/30/20 0128  WBC 7.9  HGB 13.6  HCT 39.0  MCV 95.4  PLT 333   Cardiac Enzymes: No results for input(s): CKTOTAL, CKMB, CKMBINDEX, TROPONINI in the last 168 hours. BNP (last 3 results) No results for input(s): BNP in the last 8760 hours.  ProBNP (last 3 results) No results for input(s): PROBNP in the last 8760 hours.  CBG: No results for input(s): GLUCAP in the last 168 hours.  No results found for this or any previous visit (from the past 240 hour(s)).   Studies: No results found.  Scheduled Meds: . carbamazepine  600 mg Oral TID  . Chlorhexidine Gluconate Cloth  6 each Topical Daily  . citalopram  20 mg Oral Daily  . clonazePAM  1 mg Oral Q6H  . cloNIDine  0.1 mg Transdermal Weekly  . diclofenac Sodium  4 g Topical QID  . docusate sodium  100 mg Oral BID  . feeding supplement  237 mL Oral BID BM  . Gerhardt's butt cream  1 application Topical TID  . hydrOXYzine  50 mg Intramuscular Once  . melatonin  10 mg Oral QHS  . mirtazapine  30 mg Oral QHS  . multivitamin with minerals  1 tablet Oral Daily  .  naltrexone  25 mg Oral Daily  . polyethylene glycol  17 g Oral BID  . vitamin B-6  100 mg Oral Daily  . thiamine  100 mg Oral Daily  . ziprasidone  80 mg Oral 2 times per day   Continuous Infusions: . sodium chloride      Principal Problem:   Delirium due to multiple etiologies Active Problems:   Acute metabolic encephalopathy   Hypokalemia   Polysubstance abuse (HCC)   Elevated CK   Transaminitis   Refractory seizure (HCC)   Chronic hepatitis C without hepatic coma (HCC)   Distended abdomen   Palliative care encounter   Colonic Ileus (HCC)   Organic brain syndrome   On enteral nutrition   Physical deconditioning   Protein-calorie malnutrition (HCC)   Inadequate oral nutritional intake   Impulse disorder   Consultants:  Neurology  Psychiatry  Interventional radiology  Surgery    Procedures:  9/14 lumbar puncture  9/15 EEG  9/16 EEG  9/17 EEG  9/19 overnight EEG with video  9/22 overnight EEG with video with discontinuation of long-term EEG monitoring on 9/25  9/24 core track placement  10/6 EEG  12/15 PEG tube removed    Antibiotics: Anti-infectives (From admission, onward)   Start     Dose/Rate Route Frequency Ordered Stop   04/05/20 1415  levofloxacin (LEVAQUIN) tablet 500 mg        500 mg Oral Daily 04/05/20 1327 04/11/20 0911   03/19/20 1000  metroNIDAZOLE (FLAGYL) tablet 500 mg  Status:  Discontinued        500 mg Per Tube 2 times daily 03/19/20 0822 03/23/20 0827   03/18/20 1200  amoxicillin (AMOXIL) 250 MG/5ML suspension 500 mg  Status:  Discontinued        500 mg Per Tube Every 8 hours 03/18/20 0915 03/23/20 0559   03/17/20 1200  cefTRIAXone (ROCEPHIN) 1 g in sodium chloride 0.9 % 100 mL IVPB  Status:  Discontinued        1 g  200 mL/hr over 30 Minutes Intravenous Every 24 hours 03/17/20 1048 03/18/20 0913   03/16/20 1130  metroNIDAZOLE (FLAGYL) 50 mg/ml oral suspension 500 mg  Status:  Discontinued        500 mg Per Tube 2 times  daily 03/16/20 1040 03/19/20 0821   03/16/20 1130  fluconazole (DIFLUCAN) 40 MG/ML suspension 152 mg        150 mg Per Tube  Once 03/16/20 1040 03/16/20 1245   02/11/20 1526  ceFAZolin (ANCEF) IVPB 2g/100 mL premix        2 g 200 mL/hr over 30 Minutes Intravenous  Once 02/11/20 1526 02/11/20 1641   02/05/20 0600  ceFAZolin (ANCEF) IVPB 2g/100 mL premix        2 g 200 mL/hr over 30 Minutes Intravenous To Short Stay 02/04/20 1054 02/05/20 0950   01/29/20 0600  ceFAZolin (ANCEF) IVPB 2g/100 mL premix        2 g 200 mL/hr over 30 Minutes Intravenous On call to O.R. 01/28/20 1511 01/30/20 0559   01/28/20 2000  cefTRIAXone (ROCEPHIN) 1 g in sodium chloride 0.9 % 100 mL IVPB        1 g 200 mL/hr over 30 Minutes Intravenous Every 24 hours 01/28/20 1925 02/02/20 0724   01/06/20 0900  cefTRIAXone (ROCEPHIN) 2 g in sodium chloride 0.9 % 100 mL IVPB  Status:  Discontinued        2 g 200 mL/hr over 30 Minutes Intravenous Every 12 hours 01/06/20 0105 01/06/20 2202   01/06/20 0800  vancomycin (VANCOCIN) IVPB 1000 mg/200 mL premix  Status:  Discontinued       "Followed by" Linked Group Details   1,000 mg 200 mL/hr over 60 Minutes Intravenous Every 12 hours 01/05/20 1904 01/06/20 2202   01/05/20 2000  vancomycin (VANCOREADY) IVPB 1500 mg/300 mL       "Followed by" Linked Group Details   1,500 mg 150 mL/hr over 120 Minutes Intravenous  Once 01/05/20 1904 01/06/20 0127   01/05/20 1830  cefTRIAXone (ROCEPHIN) 2 g in sodium chloride 0.9 % 100 mL IVPB        2 g 200 mL/hr over 30 Minutes Intravenous  Once 01/05/20 1829 01/05/20 2220       Time spent: 25 minutes    Junious SilkAllison Azaiah Mello ANP  [facility] 7 am - 330 pm/M-F for direct patient care and secure chat Please see Amion for contact information 119 days

## 2020-05-05 MED ORDER — MIRTAZAPINE 15 MG PO TABS
30.0000 mg | ORAL_TABLET | Freq: Every day | ORAL | Status: DC
Start: 2020-05-05 — End: 2020-07-02
  Administered 2020-05-05 – 2020-07-01 (×58): 30 mg via ORAL
  Filled 2020-05-05 (×58): qty 2

## 2020-05-05 MED ORDER — MIRTAZAPINE 15 MG PO TABS: 45.0000 mg | ORAL_TABLET | Freq: Every day | ORAL | Status: DC

## 2020-05-05 MED ORDER — STERILE WATER FOR INJECTION IJ SOLN
INTRAMUSCULAR | Status: AC
  Filled 2020-05-05: qty 10

## 2020-05-05 MED ORDER — TRAZODONE HCL 100 MG PO TABS
100.0000 mg | ORAL_TABLET | Freq: Every day | ORAL | Status: DC
Start: 2020-05-05 — End: 2020-05-10
  Administered 2020-05-05 – 2020-05-09 (×5): 100 mg via ORAL
  Filled 2020-05-05 (×5): qty 1

## 2020-05-05 NOTE — Progress Notes (Signed)
SLP Cancellation Note  [name] MRN: [id number] DOB: Feb 09, 1986   Cancelled treatment:  Orders received for cognitive assessment.  Pt having a difficult afternoon and [name] is in room with her now providing support.  Will defer assessment and continue efforts next day if feasible.  Gabriella L. Samson Frederic, MA CCC/SLP Acute Rehabilitation Services Office number [phone] Pager [phone]          Ramiyah, Mcclenahan 05/05/2020, 12:34 PM

## 2020-05-05 NOTE — Progress Notes (Signed)
Patient refusing all medications and crying in bed. Patient talking about "witch trials".

## 2020-05-05 NOTE — Progress Notes (Signed)
Pt awake drinking juice, having a pleasant conversation with this NT about her family. States she misses her kids particularly her son [name]

## 2020-05-05 NOTE — Progress Notes (Signed)
Patient stating "I'm already dead" and talking about "the church". Pt is also inconsolable. Another RN, Fulton Mole and Richard, helped give pt morning meds as well as IM Geodon. NP Rennis Harding paged.

## 2020-05-05 NOTE — Progress Notes (Addendum)
[facility] PROGRESS NOTE  Sheriece Jefcoat EUM:[id number] DOB: 1985/08/15 DOA: 01/04/2020 PCP: [name], PA-C    02/10/20                      02/15/20                       04/08/20   Status: Remains inpatient appropriate because:Altered mental status, Unsafe d/c plan and Inpatient level of care appropriate due to severity of illness   Dispo: The patient is from: Home              Anticipated d/c is to: SNF              Anticipated d/c date is: > 3 days              Patient currently is medically stable to d/c.  Barriers to discharge; continued high severity psychiatric illness with waxing and waning mentation with violent outburst requiring IVC and anticipated discharge long-term to state psychiatric facility  **As of 1/12 patient has remained alert and appropriate through most of the day.  Discussed with patient as well as family that if patient is able to maintain appropriate mentation and mood for the next 2 weeks there is a potential we could avoid placement at [facility] and that she may be appropriate for discharge home with family if they feel they are able to manage the patient.  Behaviors improving if psych at this facility feels patient will continue to need some inpatient treatment we can revisit potential short-term [facility] placement here at [facility] if beds are available.    Code Status: DNR Family Communication: Stepmother [name] 1/10 who has been updated on likelihood that patient will require discharge to a state psychiatric facility given the complexity of her underlying psychiatric issue and significant behavioral issues DVT prophylaxis: Consistently ambulatory so no DVT prophylaxis indicated Vaccination status: Fully vaccinated against Covid with second vaccine given on 02/23/2020  Pertinent social history Patient has 3 children: [name] and [name] [name]'s father has primary custody and no one in the family knows where they reside With  permission from patient's father and stepmother I contacted [name] (telephone number [phone]) who has legal custody of [name] and [name]; at this juncture [name] states that after extensive discussions with [name] he no longer wishes to visit his mother in person.  [name] is only 7 and it is not appropriate for him to visit his mother at this point.  Prior to the patient's acute illness [name] states that Aima had regular frequent supervised visits with these children.  The possibility of children recording video greetings to their mother has been discussed and she will need to discuss this with her husband before agreeing. 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  She presented to [facility] ER 3 times prior to admission.  On her 4th presentation she was admitted.  On 9/2 she was diagnosed with acute cystitis with hematuria and  was discharged home on Keflex and Zofran.  9/9 she presented with anxiety and symptoms consistent with a panic attack.  She was discharged from the ER with prescription for Vistaril.  She again presented to the ER on 9/10  reporting significant anxiety and severe insomnia and stating the Vistaril was not working.  She was given Ativan while in the ER and this appeared to improve her symptoms.  She was stable for discharge and instructed to take Benadryl at bedtime to assist with her insomnia.    On 9/13 patient brought to the ER by family.  She was refusing  to answer questions.  Clinician documented sores/lesions on her legs consistent with methamphetamine use.  When questioned regarding this patient reportedly just stared at the clinician.  There were some concerns that patient may have been abusing methamphetamine based on these clinical findings but was never confirmed.  Of note UDS was negative for methamphetamines. Prior to discharge from the ER patient appeared to be hallucinating but was able to verbally communicate.  Over the next hour she became more agitated and was given Ativan 2 mg orally. She later returned to her baseline mentation stating she did not wish to hurt herself or anybody else and requested to be discharged home. She was referred to [facility] and discharged by EDP.  Later that same day patient was returned to the ER by the police.  Patient was found laying down in a parking lot and acting bizarre.  She apparently told the police that she had put "meth" in her pants.  Patient kept reaching for vagina and it was suspected she may have placed drugs intravaginally.  No drugs were ever found.  EEG and MRI were completed with no acute abnormalities; urine drug screen positive for THC as well as BZDs (which had been given in the ED).  She underwent a lumbar puncture which was negative and was empirically started on antibiotics to treat potential infectious etiology to her encephalopathy.  After admission this patient developed seizure activity which progressed to status epilepticus therefore she was transferred to [facility] for further neurological evaluation.  After several days recurrent seizures were aborted with combination of 3 antiepileptic drugs.  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  Unfortunately she did not improve.  Because of persistent altered mentation and inability to sustain appropriate oral intake a PEG tube was placed.   This tube was eventually discontinued after patient became alert and awake enough to eat consistently during the latter portion of the hospitalization.  Of note patient has had episodes of increasing mental alertness and clarity and as of 1/12 patient confirmed that she had abruptly stopped taking her prescribed methadone prior to presentations to the ER and this likely explains some of her abnormal behaviors (methadone withdrawal in conjunction with bipolar disorder) and her status epilepticus (methadone withdrawal)  Since admission patient has had varying levels of awakeness and orientation as well as labile mood and affect.  Essentially for the first 8 weeks of hospitalization patient would have brief episodes of being awake, sometimes oriented sometimes not.  These episodes would last for several minutes to several hours but were never sustained.  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  Multiple psychotropic medications were tried without success or caused significant side effects and were discontinued.  Decision made to taper and wean all psychotropic drugs.  After this was done patient became extremely alert and agitated.  She was very impulsive and was demonstrating hypersexual behavior as well as extreme mood swings.  Seroquel tried initially to manage brain injury sequela but required high dosages unfortunately contributing to patient's depression and suicidal ideation.  It was noted that each time patient was given IM Geodon that she became alert and for the most part oriented, was able to consistently communicate and perform ADLs.  Eventually she was transitioned to oral Geodon.  Naltrexone had been started to assist with underlying hypersexual behaviors and impulsivity and to treat her underlying heroin addiction since she had not had any methadone or heroin since admission.  She has also been placed on Klonopin and clonidine to aid with her severe  anxiety.  Beginning on Christmas Eve patient began having extreme outbursts of agitation and anger requiring restraints and IVC.  Psychiatry and neurology reconsulted.  Repeat MRI unremarkable and it is felt current behaviors are combination of brain injury sequela as well as underlying psychiatric issues from chronic drug abuse.  As of 1/7 all psychiatric medications have essentially been maxed out (see below).  Recommendation from psychiatrist to seek long-term placement at a psychiatric facility  As of 1/12 patient very appropriate, interactive and oriented x3.  Has not had any delusional thoughts or statements.  No hallucinations.  Mood is happy.  She states "today I am [name] but there have been a lot of days lately that I have not been [place]".  She has confirmed that she has a prior history of bipolar disorder and previously has been treated with Thorazine but did not tolerate secondary to EPS side effects.   Subjective: Upon initial evaluation this morning patient sleeping but able to be awakened.  Appropriate mood and affect.  No specific concerns at this time.  Patient stated she wished to go back to sleep  Around 11 AM today patient began having more delusional thought processes.  She was refusing all medications and crying talking about "which trials.  30 p.m. patient was reporting to staff "I am already dead" and talking about "the church".  She was inconsolable.  Patient required 2 RNs to assist with giving morning meds as well as IM Geodon.  Security was paged to the room because of her actions but she did not require restraints.  Several minutes later I presented to patient's room noting she was sitting up on the couch underneath the window.  She was clearly upset with mild rhythmic type behaviors she does when she is stressed.  I sat down on the couch next to her and held her hand at which point she calmed.  Engaged her in conversation.  Minimal conversation with no eye contact but she again  repeated to me "I am dead".  Attempted to discuss her good days she had yesterday and asked her about the movie she was watching yesterday.  She did state no when I asked her if she finished watching the particular movie.  A look down at her newly painted toenails and told her they looked really nice and she lifted up her feet so I could see them better.  She was more calm therefore I felt comfortable leaving the room.  Instructed her that if she needed me for anything during the shift to please let the nursing staff know and I would come back to see her.  Objective: Vitals:   05/04/20 0615 05/04/20 1045  BP: (!) 119/99 132/76  Pulse: 68 72  Resp: 18 16  Temp: 97.8 F (36.6 C) 98.4 F (36.9 C)  SpO2: 99% 98%    Intake/Output Summary (Last 24 hours) at 05/05/2020 1330 Last data filed at 05/04/2020 1700 Gross per 24 hour  Intake 240 ml  Output --  Net 240 ml   Filed Weights   03/29/20 0500 04/04/20 0632 04/05/20 0613  Weight: 72.5 kg 70.9 kg 71.6 kg    Exam: General: Awaken from sleep and initially was calm with appropriate affect.  See above regarding change in mentation around 11 AM today Pulmonary:  Her lung sounds clear to auscultation, stable on room air Cardiac:  Sounds S1-S2, pulses regular, extremities warm to touch with appropriate capillary refill Abdomen: Soft, nondistended.  Nontender.  Bowel sounds present. LBM 1/12 Neurological: Moves all extremities x4, no focal neurological deficits, no tremors or rigidity/EPS sx's Psychiatric: Awake, calm, oriented x earlier today but later developed delusional thought processes and appeared to be oriented to name only although difficult to ascertain given limited verbal interaction.     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 During periods of alertness and lucidity patient denied use of methamphetamines prior to admission although did admit to abruptly stopping her methadone -Continue Geodon 80 mg BID (dose last increased on 1/3).  -Continue naltrexone 25 mg daily  -Continue Klonopin down to 1 mg every 6 hours -Continue 1:1 safety sitter and restraints as needed for aggressive behavior -In regards to her bizarre behavior she was exhibiting similar symptoms on date of admission and 24 hours previous to admission -Continue Remeron 30 mg HS  -Appreciate assistance of psych team. It appears we have maxed out on all psychotropic medications for this patient.  It is noted that many of these medications will take weeks to months for full therapeutic benefit.  Given her persistent severe behavioral swings that include violent episodes of physically attacking staff she is not a candidate for an SNF and needs more aggressive therapies.  Psychiatry has recommended long-term placement at [facility].  [facility] aware and is beginning this process.  -Patient does not have chronic seizure disorder but did have status epilepticus in September 2021 likely 2/2 acute methadone withdrawal. She has been weaned off of AEDs.  Uncertain if she would benefit from ECT and uncertain if she would be a candidate given this seizure history - Last dose of IM Geodon at 12:24 PM on 1/13 -1/12: Very stable with prolonged appropriate interaction, mentation and activities.  Patient did have brief episode of delusional thoughts 8 PM was preceded by patient being upset and crying and asking for her son [name].  Staff documented that patient stated "Gunnar Fusi and Verlon Au need to pull  her out of here, there is an AIDS epidemic and she is in the morgue".  She also reported that she "sees her horn and her mom is underneath that".  She continues to sob stating pull me out of here.  Later around 4:30 in the morning on 1/13 patient awakened he was drinking juice and having a pleasant conversation with nursing tech about her family.  She stated that she missed her kids particularly  [name].  Severe insomnia -Patient started on Remeron 1 week ago -Continues to have significant issues with insomnia therefore on 1/13 we will begin trazodone 100 mg HS -Can supplement with Vistaril would need to do so with caution given drug drug interaction that can cause movement disorder  Status epilepticus -See above regarding suspected methamphetamine use prior to admission -Repeat MRI on 12/28 within normal limits and it is felt behavioral issues related to brain injury sequelae and chronic psychiatric condition.  Neurology has signed off -Vimpat, Dilantin and phenobarbital have been tapered and discontinued at recommendation of neurologist -Initial concerns were that patient may have been using methamphetamines which would have precipitated seizure activity.  Patient later was able to clarify that she had abruptly stopped using her prescribed methadone in the days preceding her admission and onset of seizures.  History of polysubstance abuse with heroin/history of outpatient methadone treatment -Prescribed methadone 60 mg daily from [place] clinic in [place]  -Has not required methadone since arrival therefore we will continue naltrexone to treat heroin addiction in addition to impulsivity and hypersexual behaviors.  This is the last drug clinicians utilize to maintain stability after patients have been successful with methadone.  Would not return to methadone at this juncture if avoidable.    Depression and severe anxiety disorder/PTSD/no bipolar disorder now with schizoaffective features -For the  past several weeks she experienced extremes in behavior: she was either calm and mostly appropriate but mildly confused vs extremely withdrawn sad and crying vs extremely agitated and violent.  Because of this she remains under IVC -Continue Klonopin as above-further tapering at this time -Continue Tegretol 600 mg TID (max dose)-recent LFTs stable -She was reevaluated by psychiatry who recommended can transition from Tegretol to Depakote if LFTs increase in the context of her underlying hepatitis C -Scheduled Vistaril discontinued due to drug to drug interaction with Geodon -On 1/6 Lexapro discontinued in favor of Celexa by psychiatry team -On 1/12 patient did confirm to me that she has previously been diagnosed with bipolar disorder  Recurrent constipation -Continue Colace 100 mg twice daily prn -Continue MiraLAX to twice daily     Other problems: Nonobstructive transaminitis in context of hepatitis C -Elevated HCV antibody with markedly elevated HCV RNA quantitative level  consistent with chronic hepatitis C  -LFTs remain stable without an obstructive pattern noted -11/10: Discussed with ID Dr. Manson PasseyMandahar. Treatment of hepatitis C typically is initiated in the outpatient setting. Medications can be quite expensive and usually take time to obtain insurance approval.  -ID recommends scheduling outpatient appointment their clinic closer to discharge date  Back pain 2/2 abnormal urinalysis consistent with UTI -Patient with low-grade fever overnight and is now having back pain for days not responsive to NSAIDs and muscle relaxers -DG lumbar spine unremarkable -During hospitalization patient has been treated for pansensitive E. coli UTI as well as Salmonella UTI both of which have been sensitive to Levaquin -Completed Levaquin 12/20-urine culture obtained after antibiotics initiated and showed no growth -CT abdomen and pelvis on 12/17 unremarkable  Salmonella UTI -Completed  amoxicillin  Abnormal urinalysis -Not consistent with UTI, was positive for moderate hemoglobin and greater than 50 RBCs with an elevated specific gravity -Suspect dehydration -Patient denies back pain or history of renal calculi although given her confusion unclear if this history is accurate  Physical deconditioning -Resolved -Does continue to exhibit impulsivity but maintains appropriate balance and gait no longer requires one-to-one safety sitter  Large hemorrhoids. -Resolved -Markedly improved-LD Proctofoam 12/15  Dysphagia -Resolved -  Continue regular diet   Data Reviewed: Basic Metabolic Panel: Recent Labs  Lab 04/30/20 0128  NA 136  K 4.1  CL 100  CO2 26  GLUCOSE 108*  BUN 10  CREATININE 0.66  CALCIUM 9.0   Liver Function Tests: No results for input(s): AST, ALT, ALKPHOS, BILITOT, PROT, ALBUMIN in the last 168 hours. No results for input(s): LIPASE, AMYLASE in the last 168 hours. No results for input(s): AMMONIA in the last 168 hours. CBC: Recent Labs  Lab 04/30/20 0128  WBC 7.9  HGB 13.6  HCT 39.0  MCV 95.4  PLT 333   Cardiac Enzymes: No results for input(s): CKTOTAL, CKMB, CKMBINDEX, TROPONINI in the last 168 hours. BNP (last 3 results) No results for input(s): BNP in the last 8760 hours.  ProBNP (last 3 results) No results for input(s): PROBNP in the last 8760 hours.  CBG: No results for input(s): GLUCAP in the last 168 hours.  No results found for this or any previous visit (from the past 240 hour(s)).   Studies: No results found.  Scheduled Meds: . carbamazepine  600 mg Oral TID  . Chlorhexidine Gluconate Cloth  6 each Topical Daily  . citalopram  20 mg Oral Daily  . clonazePAM  1 mg Oral Q6H  . cloNIDine  0.1 mg Transdermal Weekly  . diclofenac Sodium  4 g Topical QID  . docusate sodium  100 mg Oral BID  . feeding supplement  237 mL Oral BID BM  . Gerhardt's butt cream  1 application Topical TID  . hydrOXYzine  50 mg  Intramuscular Once  . melatonin  10 mg Oral QHS  . mirtazapine  30 mg Oral QHS  . multivitamin with minerals  1 tablet Oral Daily  . naltrexone  25 mg Oral Daily  . polyethylene glycol  17 g Oral BID  . vitamin B-6  100 mg Oral Daily  . sterile water (preservative free)      . thiamine  100 mg Oral Daily  . traZODone  100 mg Oral QHS  . ziprasidone  80 mg Oral 2 times per day   Continuous Infusions: . sodium chloride      Principal Problem:   Delirium due to multiple etiologies Active Problems:   Acute metabolic encephalopathy   Hypokalemia   Polysubstance abuse (HCC)   Elevated CK   Transaminitis   Refractory seizure (HCC)   Chronic hepatitis C without hepatic coma (HCC)   Distended abdomen   Palliative care encounter   Colonic Ileus (HCC)   Organic brain syndrome   On enteral nutrition   Physical deconditioning   Protein-calorie malnutrition (HCC)   Inadequate oral nutritional intake   Impulse disorder   Consultants:  Neurology  Psychiatry  Interventional radiology  Surgery    Procedures:  9/14 lumbar puncture  9/15 EEG  9/16 EEG  9/17 EEG  9/19 overnight EEG with video  9/22 overnight EEG with video with discontinuation of long-term EEG monitoring on 9/25  9/24 core track placement  10/6 EEG  12/15 PEG tube removed    Antibiotics: Anti-infectives (From admission, onward)   Start     Dose/Rate Route Frequency Ordered Stop   04/05/20 1415  levofloxacin (LEVAQUIN) tablet 500 mg        500 mg Oral Daily 04/05/20 1327 04/11/20 0911   03/19/20 1000  metroNIDAZOLE (FLAGYL) tablet 500 mg  Status:  Discontinued        500 mg Per Tube 2 times daily 03/19/20 0822 03/23/20  0827   03/18/20 1200  amoxicillin (AMOXIL) 250 MG/5ML suspension 500 mg  Status:  Discontinued        500 mg Per Tube Every 8 hours 03/18/20 0915 03/23/20 0559   03/17/20 1200  cefTRIAXone (ROCEPHIN) 1 g in sodium chloride 0.9 % 100 mL IVPB  Status:  Discontinued        1  g 200 mL/hr over 30 Minutes Intravenous Every 24 hours 03/17/20 1048 03/18/20 0913   03/16/20 1130  metroNIDAZOLE (FLAGYL) 50 mg/ml oral suspension 500 mg  Status:  Discontinued        500 mg Per Tube 2 times daily 03/16/20 1040 03/19/20 0821   03/16/20 1130  fluconazole (DIFLUCAN) 40 MG/ML suspension 152 mg        150 mg Per Tube  Once 03/16/20 1040 03/16/20 1245   02/11/20 1526  ceFAZolin (ANCEF) IVPB 2g/100 mL premix        2 g 200 mL/hr over 30 Minutes Intravenous  Once 02/11/20 1526 02/11/20 1641   02/05/20 0600  ceFAZolin (ANCEF) IVPB 2g/100 mL premix        2 g 200 mL/hr over 30 Minutes Intravenous To Short Stay 02/04/20 1054 02/05/20 0950   01/29/20 0600  ceFAZolin (ANCEF) IVPB 2g/100 mL premix        2 g 200 mL/hr over 30 Minutes Intravenous On call to O.R. 01/28/20 1511 01/30/20 0559   01/28/20 2000  cefTRIAXone (ROCEPHIN) 1 g in sodium chloride 0.9 % 100 mL IVPB        1 g 200 mL/hr over 30 Minutes Intravenous Every 24 hours 01/28/20 1925 02/02/20 0724   01/06/20 0900  cefTRIAXone (ROCEPHIN) 2 g in sodium chloride 0.9 % 100 mL IVPB  Status:  Discontinued        2 g 200 mL/hr over 30 Minutes Intravenous Every 12 hours 01/06/20 0105 01/06/20 2202   01/06/20 0800  vancomycin (VANCOCIN) IVPB 1000 mg/200 mL premix  Status:  Discontinued       "Followed by" Linked Group Details   1,000 mg 200 mL/hr over 60 Minutes Intravenous Every 12 hours 01/05/20 1904 01/06/20 2202   01/05/20 2000  vancomycin (VANCOREADY) IVPB 1500 mg/300 mL       "Followed by" Linked Group Details   1,500 mg 150 mL/hr over 120 Minutes Intravenous  Once 01/05/20 1904 01/06/20 0127   01/05/20 1830  cefTRIAXone (ROCEPHIN) 2 g in sodium chloride 0.9 % 100 mL IVPB        2 g 200 mL/hr over 30 Minutes Intravenous  Once 01/05/20 1829 01/05/20 2220       Time spent: 25 minutes    Junious SilkAllison Domini Vandehei ANP  [facility] 7 am - 330 pm/M-F for direct patient care and secure chat Please see Amion for contact  information 120 days

## 2020-05-06 MED ORDER — STERILE WATER FOR INJECTION IJ SOLN
INTRAMUSCULAR | Status: AC
  Filled 2020-05-06: qty 10

## 2020-05-06 NOTE — Progress Notes (Addendum)
[facility] PROGRESS NOTE  Anita Davis LFY:[id number] DOB: Oct 15, 1985 DOA: 01/04/2020 PCP: [name], PA-C    02/10/20                      02/15/20                       04/08/20   Status: Remains inpatient appropriate because:Altered mental status, Unsafe d/c plan and Inpatient level of care appropriate due to severity of illness   Dispo: The patient is from: Home              Anticipated d/c is to: SNF              Anticipated d/c date is: > 3 days              Patient currently is medically stable to d/c.  Barriers to discharge; continued high severity psychiatric illness with waxing and waning mentation with violent outburst requiring IVC and anticipated discharge long-term to state psychiatric facility. We are also pursuing Anita Davis and Anita Davis at family request    Code Status: DNR Family Communication: Stepmother Anita Davis 1/14 -aware that behavior has worsened and definitely needs aggressive IP psych DVT prophylaxis: Consistently ambulatory so no DVT prophylaxis indicated Vaccination status: Fully vaccinated against Covid with second vaccine given on 02/23/2020  Pertinent social history Patient has 3 children: Anita Davis and Anita Davis's father has primary custody and no one in the family knows where they reside With permission from patient's father and stepmother I contacted Anita Davis (telephone number [phone]) who has legal custody of Anita Davis and Anita Davis; at this juncture Anita Davis states that after extensive discussions with Anita Davis he no longer wishes to visit his mother in person.  Anita Davis is only 5 and it is not appropriate for him to visit his mother at this point.  Prior to the patient's acute illness Anita Davis states that Anita Davis had regular frequent supervised visits with these children.  The possibility of children recording video greetings to their mother has been discussed and she will need to discuss this with her husband before  agreeing. 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  She presented to [facility] ER 3 times prior to admission.  On her 4th presentation she was admitted.  On 9/2 she was diagnosed with acute cystitis with hematuria and was discharged home on Keflex and Zofran.  9/9 she presented with anxiety and symptoms consistent with a panic attack.  She was discharged from the ER with prescription for Vistaril.  She again presented to the ER on 9/10  reporting significant anxiety and severe insomnia and stating the Vistaril was not working.  She was given Ativan while in the ER and this appeared to improve her symptoms.  She was stable for discharge and instructed to take Benadryl at bedtime to assist with her insomnia.    On 9/13 patient brought to the ER by family.  She was refusing to answer questions.  Clinician documented sores/lesions on her legs consistent with methamphetamine use.  When questioned regarding this patient reportedly just stared at the clinician.  There were some concerns that patient may have been abusing methamphetamine based on these clinical findings but was never confirmed.  Of note UDS was negative for methamphetamines. Prior to discharge from the ER patient appeared to be hallucinating but was able to verbally communicate.  Over the next hour she became more agitated and was given Ativan 2 mg orally. She later returned to her baseline mentation stating she did not wish to hurt  herself or anybody else and requested to be discharged home. She was referred to [facility] and discharged by EDP.  Later that same day patient was returned to the ER by the police.  Patient was found laying down in a parking lot and acting bizarre.  She apparently told the police that she had put "meth" in her pants.  Patient kept reaching for vagina and it was suspected she may have placed drugs intravaginally.  No drugs were ever found.  EEG and MRI were completed with no acute abnormalities; urine drug screen positive for THC as well as BZDs (which had been given in the ED).  She underwent a lumbar puncture which was negative and was empirically started on antibiotics to treat potential infectious etiology to her encephalopathy.  After admission this patient developed seizure activity which progressed to status epilepticus therefore she was transferred to [facility] for further neurological evaluation.  After several days recurrent seizures were aborted with combination of 3 antiepileptic drugs.  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  Unfortunately she did not improve.  Because of persistent altered mentation and inability to sustain appropriate oral intake a PEG tube was placed.  This tube was eventually discontinued after patient became alert and awake enough to eat consistently during the latter portion of the hospitalization.  Of note patient has had episodes of increasing mental alertness and clarity and as of 1/12 patient confirmed that she had abruptly stopped taking her prescribed methadone prior to presentations to the ER and this likely explains some of her abnormal behaviors (methadone withdrawal in conjunction with bipolar disorder) and her status epilepticus (methadone withdrawal)  Since admission patient has had varying levels of awakeness and orientation as well as labile mood and  affect.  Essentially for the first 8 weeks of hospitalization patient would have brief episodes of being awake, sometimes oriented sometimes not.  These episodes would last for several minutes to several hours but were never sustained.  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  Multiple psychotropic medications were tried without success or caused significant side effects and were discontinued.  Decision made to taper and wean all psychotropic drugs.  After this was done patient became extremely alert and agitated.  She was very impulsive and was demonstrating hypersexual behavior as well as extreme mood swings.  Seroquel tried initially to manage brain injury sequela but required high dosages unfortunately contributing to patient's depression and suicidal ideation.  It was noted that each time patient was given IM Geodon that she became alert and for the most part oriented, was able to consistently communicate and perform ADLs.  Eventually she was transitioned to oral Geodon.  Naltrexone had been started to assist with underlying hypersexual behaviors and impulsivity and to treat her underlying heroin addiction since she had  not had any methadone or heroin since admission.  She has also been placed on Klonopin and clonidine to aid with her severe anxiety.   Subjective: Patient apparently slept through the night.  Upon awakening patient was extremely delusional this morning and violent.  She charged the nurses several times with an object in her room threatening to hit the nursing staff.  Sitter became concerned for her wellbeing and removed herself from the room while maintaining visual contact with the patient.  Security was called as backup.  This time I arrived to the unit to evaluate patient.  During my time with the patient she was extremely delusional and paranoid with religious preoccupations including morbid preoccupation with death.  She withdrew from me  and not allow me to touch her for physical exam screaming "do not touch me".  She also reported "I know who you are, you are trying to kill me, you are Onalee HuaDavid, you are the devil".  Psychiatry team notified of need for urgent consultation and since my initial evaluation this has been completed.  In addition both [facility] as well as [facility] have been notified of prioritizing this patient for admission noting she is not appropriate to remain on the surgical floor staff concerned for their safety while attempting to care for this patient.  She finally did calm down after receiving IM Geodon and was alert and oriented but still remained agitated and fidgety.  I came back to evaluate her and she answered questions appropriately.  I asked her if she remembered her behavior from earlier today and she said yes but could not tell me what her behaviors were.  Explained to her that for her own safety the plan was to transition her to a psychiatric hospital for treatments we could not administer here in hopes of eventually discharging her home after these treatments were completed.  As soon as I left the room she raised her arms stating "hallelujah".   Objective: Vitals:   05/04/20 0615 05/04/20 1045  BP: (!) 119/99 132/76  Pulse: 68 72  Resp: 18 16  Temp: 97.8 F (36.6 C) 98.4 F (36.9 C)  SpO2: 99% 98%    Intake/Output Summary (Last 24 hours) at 05/06/2020 1327 Last data filed at 05/06/2020 [id number] Gross per 24 hour  Intake 600 ml  Output --  Net 600 ml   Filed Weights   03/29/20 0500 04/04/20 0632 04/05/20 0613  Weight: 72.5 kg 70.9 kg 71.6 kg    Exam: General: Awaken from sleep and initially was calm with appropriate affect.  See above regarding change in mentation around 11 AM today Pulmonary:  Her lung sounds clear to auscultation, stable on room air Cardiac:  Sounds S1-S2, pulses regular, extremities warm to touch with appropriate capillary refill Abdomen: Soft, nondistended.  Nontender.  Bowel sounds  present. LBM 1/12 Neurological: Moves all extremities x4, no focal neurological deficits, no tremors or rigidity/EPS sx's Psychiatric: Awake and extremely agitated.  Responds to name only.  Very delusional, paranoid with religious preoccupations and preoccupation with death and other morbid subjects.  Did not recognize this Clinical research associatewriter and called me "Onalee HuaDavid" and "you are the devil".  She also felt that the computers in the room were causing her to have problems and were communicating with the devil.  She reported that she needed to "maintain her containment" and after stating this she jumped out of bed and pulled the shades down on the windows.  I was able to convince her to take her  oral medications by telling her that she could maintain her containment by taking her medications.     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 During periods of alertness and lucidity patient denied use of methamphetamines prior to admission although did admit to abruptly stopping her methadone -Continue Geodon 80 mg BID (dose last increased on 1/3).  -Continue naltrexone 25 mg daily  -Continue Klonopin down to 1 mg every 6 hours -Continue 1:1 safety sitter and restraints as needed for aggressive behavior -In regards to her bizarre behavior she was exhibiting similar symptoms on date of admission and 24 hours previous to admission -Continue Remeron 30 mg HS  -Appreciate assistance of psych team. It appears we have maxed out on all psychotropic  medications for this patient.  It is noted that many of these medications will take weeks to months for full therapeutic benefit.  Given her persistent severe behavioral swings that include violent episodes of physically attacking staff she is not a candidate for an SNF and needs more aggressive therapies.  Psychiatry [facility]  [facility] and [facility] process.  -Patient does not have chronic seizure disorder but did have status epilepticus in September 2021 likely 2/2 acute methadone withdrawal.  -Despite having more frequent episodes of appropriate behavior including an 18-hour episode of being alert and oriented and appropriate on and interactive and happy 1/12 she continues to have frequent swings in mood and behavior.  Some of her behaviors are more sad and delusional but as happened today she can become extremely agitated and violent and is a risk to herself as well as to staff members.  It is imperative that we transfer her to an appropriate psychiatric unit as soon as possible.  Psychiatric team is working diligently with us.  Dr. Lucianne MussKumar will also [facility] today to see if she can expedite this process.  It is felt that at some point patient would benefit from ECT treatments.  Our TOC team has also [facility] noting that once patient has improved and is less agitated and violent she would be able to receive ECT treatments there  Severe insomnia -Patient started on Remeron 1 week ago -Continues to have significant issues with insomnia therefore on 1/13 we will begin trazodone 100 mg HS -Can supplement with Vistaril would need to do so with caution given drug drug interaction that can cause movement disorder  Status epilepticus -See above regarding suspected methamphetamine use prior to admission -Repeat MRI on 12/28 within normal limits and it is felt behavioral issues related to brain injury  sequelae and chronic psychiatric condition.  Neurology has signed off -Vimpat, Dilantin and phenobarbital have been tapered and discontinued at recommendation of neurologist -Initial concerns were that patient may have been using methamphetamines which would have precipitated seizure activity.  Patient later was able to clarify that she had abruptly stopped using her prescribed methadone in the days preceding her admission and onset of seizures.  History of polysubstance abuse with heroin/history of outpatient methadone treatment -[place] methadone 60 mg [place]  -Has  not required methadone since arrival therefore we will continue naltrexone to treat heroin addiction in addition to impulsivity and hypersexual behaviors.  This is the last drug clinicians utilize to maintain stability after patients have been successful with methadone.  Would not return to methadone at this juncture if avoidable.    Depression and severe anxiety disorder/PTSD/no bipolar disorder now with schizoaffective features -For the past several weeks she experienced extremes in behavior: she was either calm and mostly appropriate but mildly confused vs extremely withdrawn sad and crying vs extremely agitated and violent.  Because of this she remains under IVC -Continue Klonopin as above-further tapering at this time -Continue Tegretol 600 mg TID (max dose)-recent LFTs stable -She was reevaluated by psychiatry who recommended can transition from Tegretol to Depakote if LFTs increase in the context of her underlying hepatitis C -Scheduled Vistaril discontinued due to drug to drug interaction with Geodon -On 1/6 Lexapro discontinued in favor of Celexa by psychiatry team -On 1/12 patient did confirm to me that she has previously been diagnosed with bipolar disorder  Recurrent constipation -Continue Colace 100 mg twice daily prn -Continue MiraLAX to twice daily     Other problems: Nonobstructive  transaminitis in context of hepatitis C -Elevated HCV antibody with markedly elevated HCV RNA quantitative level  consistent with chronic hepatitis C  -LFTs remain stable without an obstructive pattern noted -11/10: Discussed with ID Dr. Manson Passey. Treatment of hepatitis C typically is initiated in the outpatient setting. Medications can be quite expensive and usually take time to obtain insurance approval.  -ID recommends scheduling outpatient appointment their clinic closer to discharge date  Back pain 2/2 abnormal urinalysis consistent with UTI -Patient with low-grade fever overnight and is now having back pain for days not responsive to NSAIDs and muscle relaxers -DG lumbar spine unremarkable -During hospitalization patient has been treated for pansensitive E. coli UTI as well as Salmonella UTI both of which have been sensitive to Levaquin -Completed Levaquin 12/20-urine culture obtained after antibiotics initiated and showed no growth -CT abdomen and pelvis on 12/17 unremarkable  Salmonella UTI -Completed amoxicillin  Abnormal urinalysis -Not consistent with UTI, was positive for moderate hemoglobin and greater than 50 RBCs with an elevated specific gravity -Suspect dehydration -Patient denies back pain or history of renal calculi although given her confusion unclear if this history is accurate  Physical deconditioning -Resolved -Does continue to exhibit impulsivity but maintains appropriate balance and gait no longer requires one-to-one safety sitter  Large hemorrhoids. -Resolved -Markedly improved-LD Proctofoam 12/15  Dysphagia -Resolved -Continue regular diet   Data Reviewed: Basic Metabolic Panel: Recent Labs  Lab 04/30/20 0128  NA 136  K 4.1  CL 100  CO2 26  GLUCOSE 108*  BUN 10  CREATININE 0.66  CALCIUM 9.0   Liver Function Tests: No results for input(s): AST, ALT, ALKPHOS, BILITOT, PROT, ALBUMIN in the last 168 hours. No results for input(s):  LIPASE, AMYLASE in the last 168 hours. No results for input(s): AMMONIA in the last 168 hours. CBC: Recent Labs  Lab 04/30/20 0128  WBC 7.9  HGB 13.6  HCT 39.0  MCV 95.4  PLT 333   Cardiac Enzymes: No results for input(s): CKTOTAL, CKMB, CKMBINDEX, TROPONINI in the last 168 hours. BNP (last 3 results) No results for input(s): BNP in the last 8760 hours.  ProBNP (last 3 results) No results for input(s): PROBNP in the last 8760 hours.  CBG: No results for input(s): GLUCAP in the last 168 hours.  No results found  for this or any previous visit (from the past 240 hour(s)).   Studies: No results found.  Scheduled Meds: . carbamazepine  600 mg Oral TID  . Chlorhexidine Gluconate Cloth  6 each Topical Daily  . citalopram  20 mg Oral Daily  . clonazePAM  1 mg Oral Q6H  . cloNIDine  0.1 mg Transdermal Weekly  . diclofenac Sodium  4 g Topical QID  . docusate sodium  100 mg Oral BID  . feeding supplement  237 mL Oral BID BM  . melatonin  10 mg Oral QHS  . mirtazapine  30 mg Oral QHS  . multivitamin with minerals  1 tablet Oral Daily  . naltrexone  25 mg Oral Daily  . polyethylene glycol  17 g Oral BID  . vitamin B-6  100 mg Oral Daily  . thiamine  100 mg Oral Daily  . traZODone  100 mg Oral QHS  . ziprasidone  80 mg Oral 2 times per day   Continuous Infusions:   Principal Problem:   Delirium due to multiple etiologies Active Problems:   Acute metabolic encephalopathy   Hypokalemia   Polysubstance abuse (HCC)   Elevated CK   Transaminitis   Refractory seizure (HCC)   Chronic hepatitis C without hepatic coma (HCC)   Distended abdomen   Palliative care encounter   Colonic Ileus (HCC)   Organic brain syndrome   On enteral nutrition   Physical deconditioning   Protein-calorie malnutrition (HCC)   Inadequate oral nutritional intake   Impulse disorder   Consultants:  Neurology  Psychiatry  Interventional radiology  Surgery    Procedures:  9/14 lumbar  puncture  9/15 EEG  9/16 EEG  9/17 EEG  9/19 overnight EEG with video  9/22 overnight EEG with video with discontinuation of long-term EEG monitoring on 9/25  9/24 core track placement  10/6 EEG  12/15 PEG tube removed    Antibiotics: Anti-infectives (From admission, onward)   Start     Dose/Rate Route Frequency Ordered Stop   04/05/20 1415  levofloxacin (LEVAQUIN) tablet 500 mg        500 mg Oral Daily 04/05/20 1327 04/11/20 0911   03/19/20 1000  metroNIDAZOLE (FLAGYL) tablet 500 mg  Status:  Discontinued        500 mg Per Tube 2 times daily 03/19/20 0822 03/23/20 0827   03/18/20 1200  amoxicillin (AMOXIL) 250 MG/5ML suspension 500 mg  Status:  Discontinued        500 mg Per Tube Every 8 hours 03/18/20 0915 03/23/20 0559   03/17/20 1200  cefTRIAXone (ROCEPHIN) 1 g in sodium chloride 0.9 % 100 mL IVPB  Status:  Discontinued        1 g 200 mL/hr over 30 Minutes Intravenous Every 24 hours 03/17/20 1048 03/18/20 0913   03/16/20 1130  metroNIDAZOLE (FLAGYL) 50 mg/ml oral suspension 500 mg  Status:  Discontinued        500 mg Per Tube 2 times daily 03/16/20 1040 03/19/20 0821   03/16/20 1130  fluconazole (DIFLUCAN) 40 MG/ML suspension 152 mg        150 mg Per Tube  Once 03/16/20 1040 03/16/20 1245   02/11/20 1526  ceFAZolin (ANCEF) IVPB 2g/100 mL premix        2 g 200 mL/hr over 30 Minutes Intravenous  Once 02/11/20 1526 02/11/20 1641   02/05/20 0600  ceFAZolin (ANCEF) IVPB 2g/100 mL premix        2 g 200 mL/hr over 30 Minutes  Intravenous To Short Stay 02/04/20 1054 02/05/20 0950   01/29/20 0600  ceFAZolin (ANCEF) IVPB 2g/100 mL premix        2 g 200 mL/hr over 30 Minutes Intravenous On call to O.R. 01/28/20 1511 01/30/20 0559   01/28/20 2000  cefTRIAXone (ROCEPHIN) 1 g in sodium chloride 0.9 % 100 mL IVPB        1 g 200 mL/hr over 30 Minutes Intravenous Every 24 hours 01/28/20 1925 02/02/20 0724   01/06/20 0900  cefTRIAXone (ROCEPHIN) 2 g in sodium chloride 0.9 % 100 mL  IVPB  Status:  Discontinued        2 g 200 mL/hr over 30 Minutes Intravenous Every 12 hours 01/06/20 0105 01/06/20 2202   01/06/20 0800  vancomycin (VANCOCIN) IVPB 1000 mg/200 mL premix  Status:  Discontinued       "Followed by" Linked Group Details   1,000 mg 200 mL/hr over 60 Minutes Intravenous Every 12 hours 01/05/20 1904 01/06/20 2202   01/05/20 2000  vancomycin (VANCOREADY) IVPB 1500 mg/300 mL       "Followed by" Linked Group Details   1,500 mg 150 mL/hr over 120 Minutes Intravenous  Once 01/05/20 1904 01/06/20 0127   01/05/20 1830  cefTRIAXone (ROCEPHIN) 2 g in sodium chloride 0.9 % 100 mL IVPB        2 g 200 mL/hr over 30 Minutes Intravenous  Once 01/05/20 1829 01/05/20 2220       Time spent: 25 minutes    [name] ANP  [facility] 7 am - 330 pm/M-F for direct patient care and secure chat Please see Amion for contact information 121 days

## 2020-05-06 NOTE — Care Management (Addendum)
Case management contacted Daune Perch, [facility] RN director to alert him that patient was waiting admission at [facility] and that patient may need to be transferred to [facility] to an available admission bed so that the patient can be cared for on a psychiatric unit in the meantime.  The patient has been displaying aggressive behaviors towards staff at this time and is still requiring medication management for her psychiatric behaviors at this time.  [name], NP is aware of plan for possible transfer to [facility] for safety reasons and care.  TOC team will continue to follow the patient for admission needs and referrals.

## 2020-05-06 NOTE — Progress Notes (Signed)
SLP Cancellation Note  [name] MRN: [id number] DOB: [birth year]   Cancelled treatment:  Today's events reviewed in chart - given escalating behaviors and the efforts to fast-track Anita Davis' admission to an inpatient psychiatric unit, will defer SLP cognitive assessment.  Her diagnosis requires knowledge and expertise that is well beyond my scope of practice and training.  Will respectfully sign off, Aalyah L. Samson Frederic, MA CCC/SLP Acute Rehabilitation Services Office number [phone] Pager [phone]       Anita Davis, Anita Davis 05/06/2020, 3:11 PM

## 2020-05-06 NOTE — Progress Notes (Signed)
Patient threw a coloring book from her bed aimed at this RN, while walking past her door.

## 2020-05-06 NOTE — Evaluation (Signed)
Occupational Therapy Evaluation [name] MRN: [id number] DOB: 03/27/1986 Today's Date: 05/06/2020    History of Present Illness 35 year old with past medical history significant for hepatitis C, polysubstance abuse brought to the hospital 9/13 for disorientation which was suspected to be substance abuse.  After she became more lucid, she was discharged, but then police brought her back later in the day and they found her passed out in a parking lot.  Urine drug screen was positive for benzodiazepine and THC. An EEG done on 9/15 showed patient was in status epilepticus.  Patient was a started on Keppra.  Despite these, EEG noted continued seizures requiring additional medications as well.  Finally on 9/18, seizures broke. Follow-up EEG on 9/20 noted evidence of epilepticity from the left central temporal region.  Repeat EEG done on 9/22 noted epileptogenicity from the left central temporal region.  Pt with PEG placed on 02/05/20 removed on 12/15   Clinical Impression   Pt now with improved mentation and mobility. Pt able to ambulate with no AD and participate in her own selfcare. Pt with functional deficit listed below and would benefit from acute OT services to address impairments to maximize level of function and safety  Per NT: pt with combative and threatening behaviors earlier this morning "reaching for the primary nurse's neck" and threatening staff with harm.  Security was called to the bedside and the nursing staff and sitter at bedside reported that they are fearful of patient's behavior and unable to console the patient at this point. Pt was administered a dose of Geodon 20 mg IM. The nurse and nursing techs are afraid to care for the patient and remain outside of the patient's room to monitor the patient after computers and movable objects are removed from the room at this time.    Follow Up Recommendations  SNF    Equipment Recommendations  None recommended by  OT    Recommendations for Other Services       Precautions / Restrictions Precautions Precautions: Fall Precaution Comments: seizures, safety sitter, combative Restrictions Weight Bearing Restrictions: No      Mobility Bed Mobility Overal bed mobility: Independent Bed Mobility: Supine to Sit;Sit to Supine     Supine to sit: Modified independent (Device/Increase time) Sit to supine: Modified independent (Device/Increase time)   General bed mobility comments: no LOB    Transfers Overall transfer level: Independent Equipment used: None Transfers: Sit to/from Stand Sit to Stand: Modified independent (Device/Increase time)         General transfer comment: no LOB    Balance Overall balance assessment: No apparent balance deficits (not formally assessed) Sitting-balance support: No upper extremity supported;Feet supported Sitting balance-Leahy Scale: Good     Standing balance support: No upper extremity supported;During functional activity Standing balance-Leahy Scale: Fair                             ADL either performed or assessed with clinical judgement   ADL Overall ADL's : Needs assistance/impaired Eating/Feeding: Modified independent   Grooming: Standing;Supervision/safety;Wash/dry hands;Wash/dry face;Oral care;Brushing hair;Applying deodorant           Upper Body Dressing : Independent;Standing       Toilet Transfer: Independent;Ambulation Toilet Transfer Details (indicate cue type and reason): ambulating around room with no physical assistance or AD Toileting- Clothing Manipulation and Hygiene: Independent       Functional mobility during ADLs: Independent  Vision Patient Visual Report: No change from baseline       Perception     Praxis      Pertinent Vitals/Pain Pain Assessment: No/denies pain Pain Score: 0-No pain Faces Pain Scale: No hurt Pain Intervention(s): Monitored during session     Hand Dominance  Right   Extremity/Trunk Assessment Upper Extremity Assessment Upper Extremity Assessment: Overall WFL for tasks assessed   Lower Extremity Assessment Lower Extremity Assessment: Defer to PT evaluation   Cervical / Trunk Assessment Cervical / Trunk Assessment: Normal   Communication Communication Communication: No difficulties   Cognition Arousal/Alertness: Awake/alert Behavior During Therapy: WFL for tasks assessed/performed Overall Cognitive Status: Within Functional Limits for tasks assessed                         Following Commands: Follows one step commands consistently;Follows multi-step commands inconsistently       General Comments: Pt pleasant and cooperative duirng session, NT reports that earlier pt comabtive, yelling and threateneing nursing staff   General Comments       Exercises     Shoulder Instructions      Home Living Family/patient expects to be discharged to:: Private residence Living Arrangements: Alone Available Help at Discharge: Family                                    Prior Functioning/Environment                   OT Problem List: Decreased activity tolerance;Decreased cognition;Decreased coordination;Decreased knowledge of precautions;Decreased knowledge of use of DME or AE;Decreased safety awareness;Impaired balance (sitting and/or standing)      OT Treatment/Interventions: Self-care/ADL training;Therapeutic activities;Therapeutic exercise;DME and/or AE instruction;Cognitive remediation/compensation;Patient/family education;Balance training    OT Goals(Current goals can be found in the care plan section) Acute Rehab OT Goals Patient Stated Goal: none stated OT Goal Formulation: With patient Time For Goal Achievement: 05/20/20 Potential to Achieve Goals: Good ADL Goals Pt Will Perform Eating: Independently Pt Will Perform Grooming: Independently;standing Additional ADL Goal #1: pt will follow multistep  pathfiding task with written instructions Independently Additional ADL Goal #2: Pt will complete full ADL at sink level Independently Additional ADL Goal #3: Pt will gather all ADL and toiletry items in room in prep for bathing and dressing  OT Frequency: Min 1X/week   Barriers to D/C:            Co-evaluation              AM-PAC OT "6 Clicks" Daily Activity     Outcome Measure Help from another person eating meals?: None Help from another person taking care of personal grooming?: None Help from another person toileting, which includes using toliet, bedpan, or urinal?: None Help from another person bathing (including washing, rinsing, drying)?: None Help from another person to put on and taking off regular upper body clothing?: None Help from another person to put on and taking off regular lower body clothing?: None 6 Click Score: 24   End of Session Nurse Communication: Mobility status;Other (comment) (NT informed OT of pt's behavior earlier this morning)  Activity Tolerance: Patient tolerated treatment well Patient left: in bed;with call bell/phone within reach;with nursing/sitter in room (sitter watching pt from outside of room)  OT Visit Diagnosis: Unsteadiness on feet (R26.81);Muscle weakness (generalized) (M62.81);Other symptoms and signs involving the nervous system (R29.898);Other symptoms and signs involving  cognitive function;Ataxia, unspecified (R27.0);Feeding difficulties (R63.3);Other abnormalities of gait and mobility (R26.89)                Time: 6767-2094 OT Time Calculation (min): 21 min Charges:  OT General Charges $OT Visit: 1 Visit OT Evaluation $OT Re-eval: 1 Re-eval    Galen Manila 05/06/2020, 11:06 AM

## 2020-05-06 NOTE — Progress Notes (Signed)
Patient attempted to reach for this RNs neck.

## 2020-05-06 NOTE — Consult Note (Signed)
[facility] Face-to-Face Psychiatry Consult   Reason for Consult: Significant behavioral disturbances, bizarre behavior, agitation Referring Physician:  Dr.Pahwani Patient Identification: Anita Davis MRN:  [id number] Principal Diagnosis: Delirium due to multiple etiologies Diagnosis:  Principal Problem:   Delirium due to multiple etiologies Active Problems:   Acute metabolic encephalopathy   Hypokalemia   Polysubstance abuse (HCC)   Elevated CK   Transaminitis   Refractory seizure (HCC)   Chronic hepatitis C without hepatic coma (HCC)   Distended abdomen   Palliative care encounter   Colonic Ileus (HCC)   Organic brain syndrome   On enteral nutrition   Physical deconditioning   Protein-calorie malnutrition (HCC)   Inadequate oral nutritional intake   Impulse disorder   Total Time spent with patient: 45 minutes  Subjective:   Anita Davis is a 35 y.o. female patient with a history of polysubstance use disorder, who presented to [facility] in Sep with encephalopathy. EEG showed status epilepticus arising from the left central region. She was treated with Keppra, Vimpat and Dilantin and her mental status reportedly improved. She was transferred to [facility] for further work up. Per chart review, her mental status has fluctuated and has been complicated by her extended length of stay.  Patient overall has notable improvement in her mentation, however she continues to exhibit unpredictable behaviors that result in significant violence, aggression, agitation, and mood irritability.  At times she presents as delusional, psychotic, paranoid, and religiously preoccupied.  She continues to remain with worsening aggression physical restraints from security, chemical restraints to assure her safety and the safety of others within the hospital.  Psychiatry continues to follow for significant behavioral disturbances, unpredictable behaviors, bizarre behaviors, worsening psychosis, and  aggression.  Patient denies any suicidal thoughts, homicidal thoughts, and/or hallucinations at this time.  She also does not appear to be responding to internal stimuli or preoccupied, however she was observed with external stimulation response this morning prior to receiving an injection.    On evaluation patient is found in her bed resting peacefully.  She recently received an intramuscular injection due to aggression, delusional thinking, and worsening psychosis.  She was very argumentative, physically threatened staff, and required additional reinforcement by security.  Patient is alert and oriented, able to tell me her name, date of birth, and while she remains in the hospital at this time.  She also reports that today's episodes and behaviors were out of her control.  She is unable to describe what happened, however reports it was not intentional.  Patient is much more calm, and this may be secondary to sedating effects from her injection that she received this morning.  In the middle of the evaluation she does ask for ice cream, in which she was directed to her lunch tray that was sitting at her bedside.  It which time she is set up grabbed her tray, with visible tremors and began to eat.  Patient admits to a previous diagnosis of bipolar disorder and PTSD.  At that time she wished to end the evaluation.  HP/synopsis: Please see full note from [name] nurse practitioner, and has also been following this patient throughout her duration of stay of 115 days.  Since admission patient has had varying levels of awakeness and orientation as well as labile mood and affect.  Essentially for the first 8 weeks of hospitalization patient would have brief episodes of being awake, sometimes oriented sometimes not.  These episodes would last for several minutes to several hours but  were never sustained.  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  Multiple  psychotropic medications were tried without success or caused significant side effects and were discontinued.  Decision made to taper and wean all psychotropic drugs.  After this was done patient became extremely alert and agitated.  She was very impulsive and was demonstrating hypersexual behavior as well as extreme mood swings.  Seroquel tried initially to manage brain injury sequela but required high dosages unfortunately contributing to patient's depression and suicidal ideation.  It was noted that each time patient was given IM Geodon that she became alert and for the most part oriented, was able to consistently communicate and perform ADLs.  Eventually she was transitioned to oral Geodon.  Naltrexone had been started to assist with underlying hypersexual behaviors and impulsivity and to treat her underlying heroin addiction since she had not had any methadone or heroin since admission.  She has also been placed on Klonopin and clonidine to aid with her severe anxiety.  Recently patient has had issues with extreme agitation around the holidays and was acting out with wandering behaviors, spitting on staff, using profanity in the hallways and fighting with security staff when restraints were applied.  Some of her behaviors have been manipulative especially in regards to attempting to obtain drugs such as benzodiazepines or narcotics.  Neurology was reconsulted.  Repeat MRI unremarkable and it is felt current behaviors are combination of brain injury sequela as well as underlying psychiatric issues from chronic drug abuse.  Recently patient has become focused on obtaining methadone.  Typically when she becomes anxious or restless she request some sort of drug: " I need a hot shot" (Ambien), " I need something for my anxiety I cannot take it anymore"; and most recently she is focused on obtaining methadone.  She remains confused and alternates between sad and depressed or angry and defiant.  The patient was  observed to be in the bed in a deep sleep.  She continues to have a one-on-one sitter for her safety, as she continues to have periods of alertness, followed by some catatonia, followed by aggression and combativeness.  As noted in chart patient has had multiple failed attempts at multiple psychotropic medication to include first and second generation antipsychotic, multiple mood stabilizers, and several trials of benzodiazepines.  There have been multiple attempts to wean her off of the benzodiazepines, however patient has notable agitation and aggression and is a concern for withdrawal syndrome.  At the time of this evaluation patient is currently taking it carbamazepine 600 mg p.o. 3 times daily, clonazepam 1.5 mg p.o. every 6 hours, Escitalopram 20 mg p.o. nightly, mirtazapine 30 mg p.o. nightly, and ziprasidone 80 mg p.o. twice daily.   Past Psychiatric History: Please see initial evaluation for full details. I have reviewed the history. No updates at this time.   Risk to Self:  yes Risk to Others:  yes Prior Inpatient Therapy:  see initial eval. No update Prior Outpatient Therapy:  see initial eval. No update  Past Medical History:  Past Medical History:  Diagnosis Date  . Allergy   . Bronchitis   . Hepatitis-C   . History of gestational diabetes   . HPV in female   . Substance abuse [facility])     Past Surgical History:  Procedure Laterality Date  . HEMORRHOID BANDING  age 36  . IR GASTROSTOMY TUBE REMOVAL  04/06/2020  . IR REPLC GASTRO/COLONIC TUBE PERCUT W/FLUORO  02/11/2020  . LAPAROSCOPIC GASTROSTOMY N/A 02/05/2020   Procedure: LAPAROSCOPIC GASTROSTOMY PLACEMENT;  Surgeon: Gaynelle Adu, MD;  Location: [facility] OR;  Service: General;  Laterality: N/A;   Family History:  Family History  Problem Relation Age of Onset  . Depression Mother   . Cervical cancer Mother   . Hypertension Father   . Diabetes Father    Family Psychiatric  History: Please see initial evaluation for full  details. I have reviewed the history. No updates at this time.    Social History:  Social History   Substance and Sexual Activity  Alcohol Use No     Social History   Substance and Sexual Activity  Drug Use Not Currently  . Types: IV, Cocaine, Hydrocodone, Oxycodone, Marijuana, Heroin   Comment: takes methadone- now taking subutex (03/27/19)    Social History   Socioeconomic History  . Marital status: Single    Spouse name: Not on file  . Number of children: Not on file  . Years of education: Not on file  . Highest education level: Not on file  Occupational History  . Not on file  Tobacco Use  . Smoking status: Current Every Day Smoker    Years: 24.00    Types: Cigarettes, Cigars  . Smokeless tobacco: Never Used  . Tobacco comment: 1 cigar/daily. former 1 pack cigarette smoker quit 05/2019  currently smokes CBD  Vaping Use  . Vaping Use: Every day  . Substances: Nicotine, Flavoring  Substance and Sexual Activity  . Alcohol use: No  . Drug use: Not Currently    Types: IV, Cocaine, Hydrocodone, Oxycodone, Marijuana, Heroin    Comment: takes methadone- now taking subutex (03/27/19)  . Sexual activity: Not Currently    Birth control/protection: Abstinence  Other Topics Concern  . Not on file  Social History Narrative  . Not on file   Social Determinants of Health   Financial Resource Strain: Not on file  Food Insecurity: Not on file  Transportation Needs: Not on file  Physical Activity: Not on file  Stress: Not on file  Social Connections: Not on file   Additional Social History:    Allergies:  No Known Allergies  Labs: No results found for this or any previous visit (from the past 48 hour(s)).  Current Facility-Administered Medications  Medication Dose Route Frequency Provider Last Rate Last Admin  . acetaminophen (TYLENOL) tablet 650 mg  650 mg Oral Q6H PRN Russella Dar, NP   650 mg at 05/03/20 0542  . benzocaine (ORAJEL) 10 % mucosal gel    Mouth/Throat QID PRN Marikay Alar, FNP   Given at 04/23/20 0259  . butalbital-acetaminophen-caffeine (FIORICET) 50-325-40 MG per tablet 1 tablet  1 tablet Oral Q6H PRN Russella Dar, NP   1 tablet at 05/04/20 1355  . carbamazepine (TEGRETOL) tablet 600 mg  600 mg Oral TID Russella Dar, NP   600 mg at 05/06/20 1106  . Chlorhexidine Gluconate Cloth 2 % PADS 6 each  6 each Topical Daily Gaynelle Adu, MD   6 each at 05/04/20 [phone]  . citalopram (CELEXA) tablet 20 mg  20 mg Oral Daily Maryagnes Amos, FNP   20 mg at 05/06/20 1105  . clonazePAM (KLONOPIN) tablet 1 mg  1 mg Oral Q6H Russella Dar, NP   1 mg at 05/06/20 1105  . cloNIDine (CATAPRES - Dosed in mg/24 hr) patch 0.1 mg  0.1 mg Transdermal Weekly Alwyn Ren, MD   0.1 mg at 04/30/20 1700  .  diclofenac Sodium (VOLTAREN) 1 % topical gel 4 g  4 g Topical QID Russella DarEllis, Allison L, NP   4 g at 04/22/20 1120  . docusate sodium (COLACE) capsule 100 mg  100 mg Oral BID Russella DarEllis, Allison L, NP   100 mg at 05/06/20 1105  . feeding supplement (ENSURE ENLIVE / ENSURE PLUS) liquid 237 mL  237 mL Oral BID BM Hughie ClossPahwani, Ravi, MD   237 mL at 05/04/20 1324  . Gerhardt's butt cream 1 application  1 application Topical TID Narda BondsNettey, Ralph A, MD   1 application at 05/01/20 1033  . Gerhardt's butt cream   Topical PRN Narda BondsNettey, Ralph A, MD   Given at 03/07/20 1540  . hydrOXYzine (VISTARIL) injection 50 mg  50 mg Intramuscular Once Junious SilkEllis, Allison L, NP      . ibuprofen (ADVIL) tablet 600 mg  600 mg Oral Q6H PRN Russella DarEllis, Allison L, NP   600 mg at 05/06/20 0735  . melatonin tablet 10 mg  10 mg Oral QHS Maryagnes AmosStarkes-Perry, Ashwin Tibbs S, FNP   10 mg at 05/05/20 2003  . mirtazapine (REMERON) tablet 30 mg  30 mg Oral QHS Russella DarEllis, Allison L, NP   30 mg at 05/05/20 2004  . multivitamin with minerals tablet 1 tablet  1 tablet Oral Daily Russella DarEllis, Allison L, NP   1 tablet at 05/06/20 1105  . naltrexone (DEPADE) tablet 25 mg  25 mg Oral Daily Russella DarEllis, Allison L, NP   25 mg at 05/06/20  1105  . polyethylene glycol (MIRALAX / GLYCOLAX) packet 17 g  17 g Oral BID Russella DarEllis, Allison L, NP   17 g at 05/04/20 0943  . pyridOXINE (VITAMIN B-6) tablet 100 mg  100 mg Oral Daily Junious SilkEllis, Allison L, NP   100 mg at 05/06/20 1106  . sodium chloride 0.9 % bolus 500 mL  500 mL Intravenous Once Kathlen ModyAkula, Vijaya, MD      . thiamine tablet 100 mg  100 mg Oral Daily Russella DarEllis, Allison L, NP   100 mg at 05/06/20 1105  . traZODone (DESYREL) tablet 100 mg  100 mg Oral QHS Russella DarEllis, Allison L, NP   100 mg at 05/05/20 2003  . ziprasidone (GEODON) capsule 80 mg  80 mg Oral 2 times per day Marguerita MerlesSheikh, Omair EvergreenLatif, DO   80 mg at 05/06/20 [id number]  . ziprasidone (GEODON) injection 20 mg  20 mg Intramuscular Q8H PRN Russella DarEllis, Allison L, NP   20 mg at 05/06/20 [id number]    Musculoskeletal: Strength & Muscle Tone: N/A Gait & Station: normal Patient leans: N/A  Psychiatric Specialty Exam: Physical Exam Vitals and nursing note reviewed.     Review of Systems  Unable to perform ROS: Other    Blood pressure 132/76, pulse 72, temperature 98.4 F (36.9 C), temperature source Oral, resp. rate 16, height 5\' 5"  (1.651 m), weight 71.6 kg, SpO2 98 %.Body mass index is 26.27 kg/m.  General Appearance: Fairly Groomed  Eye Contact:  Poor  Speech:  Clear and Coherent and Slow  Volume:  Normal,  Mood:  Depressed, Dysphoric and Irritable  Affect:  Inappropriate, Labile, Full Range and Restricted  Thought Process:  Linear and Descriptions of Associations: Intact,   Orientation:  Full (Time, Place, and Person)  Thought Content:  Logical  Suicidal Thoughts:  No  Homicidal Thoughts:  No  Memory:  Immediate;   Fair Recent;   Poor Remote;   Poor  Judgement:  Impaired  Insight:  Lacking  Psychomotor Activity:  Tremor  Concentration:  limited  Recall:  Poor  Fund of Knowledge:  Fair  Language:  Fair  Akathisia:  No  Handed:  Right  AIMS (if indicated):     Assets:  Desire for Improvement Resilience Social Support  ADL's:  Complete   Cognition:  Altered at times  Sleep:       Assessment Anita Davis is a 35 y.o. year old female with a history of polysubstance use disorder, who presented to [facility] in Sep 2021 with encephalopathy. EEG showed status epilepticus arising from the left central region. She was treated with Keppra, Vimpat and Dilantin and her mental status reportedly improved. She was transferred to [facility] for further work up. Per chart review, her mental status has improved, however her mood disorder is now manifesting delusions, psychosis, and persecutory beliefs.  Today's event was highlighted by paranoia, religious preoccupations, and mania that resulted in very dangerous and unpredictable behaviors.    #Bipolar affective psychosis  # PTSD # Substance-induced mood disorder  Patient is asleep during the evaluation, due to her ongoing disruptiveness, combativeness, and violent outbursts after receiving an injection.  Patient continues to be very hostile, threatening, physically aggressive, worsening mood lability, rapid cycling and now psychosis. Patient needs to continue under IVC to ensure her safety with adequate safety precaution.  Patient has maximized multiple psychotropic medication with little to some improvement, however has plateaued at this point and appears to be treatment resistant.  Patient continues to require higher level of care to ensure her overall safety with goals to restore and recover her previous mental state.  Although this has been quite a feat due to extended length of stay, seizure disorder, polysubstance abuse, and altered mental status.    Treatment Plan Summary: Plan as below - Continue IVC due to high risk of self harm and others.  -Continue safety precaution and sitter for unpredicted behavior - Continue Geodon 80 mg BID  -Continue Geodon 20 mg IM twice daily as needed for agitation, aggression, and psychosis. - Monitor EKG to rule out QTc prolongation. Would  advise discontinuation of Geodon if QTc>500 msec -Will continue citalopram 20 mg p.o. daily for management of behavioral disturbances. - Continue naltrexone 25 mg daily  - Continue melatonin and Trazodone100mg  po qhs  - Consider tapering off or adjust dosing Klonopin if medically appropriate.  -Continue to recommend inpatient admission at the state psychiatric hospital for long-term management of chronic psychiatric condition, unpredictable and erratic behaviors, worsening aggression and rapid cycling that results in dangerous behaviors and insult to injury to self and others.  Discussed the above recommendation with Junious SilkAllison Ellis and Dr. Lucianne MussKumar. We have maximized all treatment options at this time, with some improvement initially however she has not progressed in the past month. Will recommend higher level of care at a state psychiatric hospital for long term management of chronic psychiatric condition. [facility] also offers medical unit which can continue to assist patient in her overall recovery of mental illness and medical health.  At this current time patient does not meet criteria for acute psychiatric hospitalization as noted she has maximized most treatment options and remains unstable.    Disposition: Recommend inpatient admission at state psychiatric hospital. Please coordinate referral with SW to facili[facility]. Patient appears to be treatment resistment, and all services have been maximized.     Maryagnes Amosakia S Starkes-Perry, FNP 05/06/2020 12:24 PM

## 2020-05-06 NOTE — Care Management (Signed)
Case management spoke with [name], NP and the family, [name], step mother requested to fax patient's clinicals to [name] in [place] and  [facility] in [place], [place] for possible bed availability.  [name] is permanently closed.  I called and spoke with admissions at [facility] In [place], [place] and the facility is willing to look at the patient's clinicals as back up plan for admission as well.  I faxed clinicals to the facility to [phone].  Hopefully the patient will be able to admit to [facility] psychiatric facility when a bed is available.  In the meantime, patient has been referred to [facility] and [facility] Facility as well.
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
## 2020-05-07 MED ORDER — ZOLPIDEM TARTRATE 5 MG PO TABS
5.0000 mg | ORAL_TABLET | Freq: Once | ORAL | Status: AC
  Administered 2020-05-07: 5 mg via ORAL
  Filled 2020-05-07: qty 1
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
## 2020-05-08 DIAGNOSIS — F15920 Other stimulant use, unspecified with intoxication, uncomplicated: Secondary | ICD-10-CM

## 2020-05-08 MED ORDER — STERILE WATER FOR INJECTION IJ SOLN
INTRAMUSCULAR | Status: AC
  Filled 2020-05-08: qty 10

## 2020-05-08 NOTE — Progress Notes (Signed)
Pt resting calmly on bed at end of dayshift.

## 2020-05-08 NOTE — Progress Notes (Signed)
Patient expressed she is having difficulty sleeping at night despite of medications and feels that sleep deprivation is affecting her mood. States she is up 4-5 times throughout night eating/drinking when she should be sleeping. Stated she would like to increase physical activity during the day if possible to see if that helps.

## 2020-05-08 NOTE — Progress Notes (Signed)
Pt was very agitated and wandering the hallways. We were able to get her back in her room. Security came and pt allowed me to administer geodon IM and then she took her tegretol willingly. Security was only visible in the room, did not touch pt.  Notified MD.

## 2020-05-08 NOTE — Progress Notes (Signed)
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    Patient was transferred from [facility] for status epilepticus.  She was seen by neurology.  Patient has had behavioral issues, requiring Geodon.                                     Subjective   Patient seen and examined, denies any complaints.   Assessment/Plan:     1. Persistent metabolic encephalopathy/psychosis-initially treated with IVIG and steroids for possible autoimmune encephalitis.  CSF study was not consistent with meningitis.  Continue Geodon 80 mg twice daily.  Naltrexone 25 mg daily.  Klonopin 1 mg every 6 hours.  Continue sitter for aggressive behavior. 2. Insomnia-continue Remeron 3. Status epilepticus-from suspected methamphetamine use prior to admission.  Neurology has signed off.  Vimpat, Dilantin and phenobarb have been tapered and discontinued at recommendation of neurologist.  Initial concern was that patient may have been using methamphetamine which could have precipitated seizure activity.  Patient also abruptly stopped using her prescribed methadone in the days preceding her admission and onset of seizures. 4. Depression/severe anxiety/PTSD-continue Klonopin as above, Tegretol.  She was seen by psychiatry who recommended that she can transition from Trileptal to Depakote if LFTs increasing context of underlying hepatitis C.  Scheduled Vistaril was discontinued due to drug and drug interactions with Geodon.     COVID-19 Labs  No results for input(s): DDIMER, FERRITIN, LDH, CRP in the last 72 hours.  Lab Results  Component Value Date   SARSCOV2NAA NEGATIVE 01/05/2020     Scheduled medications:   . carbamazepine  600 mg Oral TID  . Chlorhexidine Gluconate Cloth  6  each Topical Daily  . citalopram  20 mg Oral Daily  . clonazePAM  1 mg Oral Q6H  . cloNIDine  0.1 mg Transdermal Weekly  . diclofenac Sodium  4 g Topical QID  . docusate sodium  100 mg Oral BID  . feeding supplement  237 mL Oral BID BM  . melatonin  10 mg Oral QHS  . mirtazapine  30 mg Oral QHS  . multivitamin with minerals  1 tablet Oral Daily  . naltrexone  25 mg Oral Daily  . polyethylene glycol  17 g Oral BID  . vitamin B-6  100 mg Oral Daily  . sterile water (preservative free)      . thiamine  100 mg Oral Daily  . traZODone  100 mg Oral QHS  . ziprasidone  80 mg Oral 2 times per day           Antibiotics: Anti-infectives (From admission, onward)   Start     Dose/Rate Route Frequency Ordered Stop   04/05/20 1415  levofloxacin (LEVAQUIN) tablet 500 mg        500 mg Oral Daily 04/05/20 1327 04/11/20 0911   03/19/20 1000  metroNIDAZOLE (FLAGYL) tablet 500 mg  Status:  Discontinued        500 mg Per Tube 2 times daily 03/19/20 0822 03/23/20 0827   03/18/20 1200  amoxicillin (AMOXIL) 250 MG/5ML suspension 500 mg  Status:  Discontinued        500 mg Per Tube Every 8 hours 03/18/20 0915 03/23/20 0559   03/17/20  1200  cefTRIAXone (ROCEPHIN) 1 g in sodium chloride 0.9 % 100 mL IVPB  Status:  Discontinued        1 g 200 mL/hr over 30 Minutes Intravenous Every 24 hours 03/17/20 1048 03/18/20 0913   03/16/20 1130  metroNIDAZOLE (FLAGYL) 50 mg/ml oral suspension 500 mg  Status:  Discontinued        500 mg Per Tube 2 times daily 03/16/20 1040 03/19/20 0821   03/16/20 1130  fluconazole (DIFLUCAN) 40 MG/ML suspension 152 mg        150 mg Per Tube  Once 03/16/20 1040 03/16/20 1245   02/11/20 1526  ceFAZolin (ANCEF) IVPB 2g/100 mL premix        2 g 200 mL/hr over 30 Minutes Intravenous  Once 02/11/20 1526 02/11/20 1641   02/05/20 0600  ceFAZolin (ANCEF) IVPB 2g/100 mL premix        2 g 200 mL/hr over 30 Minutes Intravenous To Short Stay 02/04/20 1054 02/05/20 0950   01/29/20 0600   ceFAZolin (ANCEF) IVPB 2g/100 mL premix        2 g 200 mL/hr over 30 Minutes Intravenous On call to O.R. 01/28/20 1511 01/30/20 0559   01/28/20 2000  cefTRIAXone (ROCEPHIN) 1 g in sodium chloride 0.9 % 100 mL IVPB        1 g 200 mL/hr over 30 Minutes Intravenous Every 24 hours 01/28/20 1925 02/02/20 0724   01/06/20 0900  cefTRIAXone (ROCEPHIN) 2 g in sodium chloride 0.9 % 100 mL IVPB  Status:  Discontinued        2 g 200 mL/hr over 30 Minutes Intravenous Every 12 hours 01/06/20 0105 01/06/20 2202   01/06/20 0800  vancomycin (VANCOCIN) IVPB 1000 mg/200 mL premix  Status:  Discontinued       "Followed by" Linked Group Details   1,000 mg 200 mL/hr over 60 Minutes Intravenous Every 12 hours 01/05/20 1904 01/06/20 2202   01/05/20 2000  vancomycin (VANCOREADY) IVPB 1500 mg/300 mL       "Followed by" Linked Group Details   1,500 mg 150 mL/hr over 120 Minutes Intravenous  Once 01/05/20 1904 01/06/20 0127   01/05/20 1830  cefTRIAXone (ROCEPHIN) 2 g in sodium chloride 0.9 % 100 mL IVPB        2 g 200 mL/hr over 30 Minutes Intravenous  Once 01/05/20 1829 01/05/20 2220       DVT prophylaxis: Patient is ambulatory, no DVT prophylaxis provided.  Code Status: DNR  Family Communication: No family at bedside   Consultants:  Neurology  Psychiatry  Interventional radiology  Surgery   Procedures:  9/14 lumbar puncture  9/15 EEG  9/16 EEG  9/17 EEG  9/19 overnight EEG with video  9/22 overnight EEG with video with discontinuation of long-term EEG monitoring on 9/25  9/24 core track placement  10/6 EEG  12/15 PEG tube removed     Objective   Vitals:   05/04/20 1045 05/06/20 2100 05/07/20 2300 05/08/20 0500  BP: 132/76 (!) 93/57 106/72   Pulse: 72 69 71   Resp: 16 16 18    Temp: 98.4 F (36.9 C) 98.7 F (37.1 C) 98 F (36.7 C)   TempSrc: Oral Axillary Oral   SpO2: 98% 97% 98%   Weight:    74.8 kg  Height:        Intake/Output Summary (Last 24 hours) at  05/08/2020 1358 Last data filed at 05/08/2020 0431 Gross per 24 hour  Intake 600 ml  Output --  Net 600 ml    01/14 1901 - 01/16 0700 In: 720 [P.O.:720] Out: -   Filed Weights   04/04/20 6195 04/05/20 0613 05/08/20 0500  Weight: 70.9 kg 71.6 kg 74.8 kg    Physical Examination:    General-appears in no acute distress  Heart-S1-S2, regular, no murmur auscultated  Lungs-clear to auscultation bilaterally, no wheezing or crackles auscultated  Abdomen-soft, nontender, no organomegaly  Extremities-no edema in the lower extremities  Neuro-alert, oriented x3, no focal deficit noted   Status is: Inpatient  Dispo: The patient is from: Home              Anticipated d/c is to: Long-term psychiatric facility              Anticipated d/c date is 05/12/2020              Patient currently medically stable for discharge  Barrier to discharge-awaiting bed at inpatient psych facility       Meredeth Ide   [facility] If 7PM-7AM, please contact night-coverage at [email or web address], Office  [phone]   05/08/2020, 1:58 PM  LOS: 123 days

## 2020-05-09 MED ORDER — STERILE WATER FOR INJECTION IJ SOLN
INTRAMUSCULAR | Status: AC
  Filled 2020-05-09: qty 10

## 2020-05-09 NOTE — TOC Progression Note (Signed)
Transition of Care [facility]) - Progression Note    Patient Details  Name: Anita Davis MRN: [id number] Date of Birth: 22-Mar-1986  Transition of Care [facility]) CM/SW [place], RN Phone Number: 05/09/2020, 12:10 PM  Clinical Narrative:    Case management met with the patient at the bedside this morning regarding transitions of care to [facility] and/or [facility] - depending on first available bed availability. Patient was resting with sitter present in the room for safety at this time.   I called and spoke with Bradley Ferris, RN director at [facility] and the facility is at max capacity today due to staffing shortages and they will not have an available bed today with no expected discharges today.  I also called [facility] and spoke with Baxter Flattery, [facility] in admissions and the patient remains on the priority waiting list for the facility and is not expected a discharge today as well.  The facility was unable to state where patient was located on the waiting list but they will notify case management or social work once a bed is available.  Case management will continue to follow the patient for available admission bed at a psychiatric facility with ECT capability.  No open admission beds are available at this time at either facility and the patient continues to be under IVC orders for safety with sitter present in room.   Expected Discharge Plan: [place] Barriers to Discharge: Continued Medical Work up,Inadequate or no insurance,Active Substance Use - Placement,SNF Pending payor source - LOG,SNF Pending Medicaid,SNF Pending bed offer  Expected Discharge Plan and Services Expected Discharge Plan: [place] In-house Referral: Development worker, community   Post Acute Care Choice: [place] Living arrangements for the past 2 months: Mobile Home                                        Social Determinants of Health (SDOH) Interventions    Readmission Risk Interventions No flowsheet data found.
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
## 2020-05-10 DIAGNOSIS — F4310 Post-traumatic stress disorder, unspecified: Secondary | ICD-10-CM

## 2020-05-10 MED ORDER — DOCUSATE SODIUM 100 MG PO CAPS: 100.0000 mg | ORAL_CAPSULE | Freq: Two times a day (BID) | ORAL | 0 refills | Status: DC

## 2020-05-10 MED ORDER — CARBAMAZEPINE 200 MG PO TABS: 600.0000 mg | ORAL_TABLET | Freq: Three times a day (TID) | ORAL | Status: DC

## 2020-05-10 MED ORDER — CLONAZEPAM 1 MG PO TABS: 1.0000 mg | ORAL_TABLET | Freq: Four times a day (QID) | ORAL | 0 refills | Status: DC

## 2020-05-10 MED ORDER — STERILE WATER FOR INJECTION IJ SOLN
INTRAMUSCULAR | Status: AC
  Filled 2020-05-10: qty 10

## 2020-05-10 MED ORDER — THIAMINE HCL 100 MG PO TABS: 100.0000 mg | ORAL_TABLET | Freq: Every day | ORAL | Status: DC

## 2020-05-10 MED ORDER — CITALOPRAM HYDROBROMIDE 20 MG PO TABS: 20.0000 mg | ORAL_TABLET | Freq: Every day | ORAL | Status: DC

## 2020-05-10 MED ORDER — POLYETHYLENE GLYCOL 3350 17 G PO PACK: 17.0000 g | PACK | Freq: Two times a day (BID) | ORAL | 0 refills | Status: DC

## 2020-05-10 MED ORDER — ZIPRASIDONE HCL 80 MG PO CAPS: 80.0000 mg | ORAL_CAPSULE | Freq: Two times a day (BID) | ORAL | Status: DC

## 2020-05-10 MED ORDER — NALTREXONE HCL 50 MG PO TABS: 25.0000 mg | ORAL_TABLET | Freq: Every day | ORAL | Status: DC

## 2020-05-10 MED ORDER — PYRIDOXINE HCL 100 MG PO TABS: 100.0000 mg | ORAL_TABLET | Freq: Every day | ORAL | Status: DC

## 2020-05-10 MED ORDER — TRAZODONE HCL 150 MG PO TABS
150.0000 mg | ORAL_TABLET | Freq: Every day | ORAL | Status: DC
  Administered 2020-05-10 – 2020-07-01 (×53): 150 mg via ORAL
  Filled 2020-05-10 (×53): qty 1

## 2020-05-10 MED ORDER — TRAZODONE HCL 150 MG PO TABS: 150.0000 mg | ORAL_TABLET | Freq: Every day | ORAL | Status: DC

## 2020-05-10 MED ORDER — ENSURE ENLIVE PO LIQD: 237.0000 mL | Freq: Two times a day (BID) | ORAL | 12 refills | Status: DC

## 2020-05-10 MED ORDER — ZIPRASIDONE MESYLATE 20 MG IM SOLR: 20.0000 mg | Freq: Three times a day (TID) | INTRAMUSCULAR | Status: DC | PRN

## 2020-05-10 MED ORDER — CLONIDINE 0.1 MG/24HR TD PTWK: 0.1000 mg | MEDICATED_PATCH | TRANSDERMAL | 12 refills | Status: DC

## 2020-05-10 MED ORDER — MELATONIN 10 MG PO TABS: 10.0000 mg | ORAL_TABLET | Freq: Every day | ORAL | 0 refills | Status: DC

## 2020-05-10 MED ORDER — MIRTAZAPINE 30 MG PO TABS: 30.0000 mg | ORAL_TABLET | Freq: Every day | ORAL | Status: DC

## 2020-05-10 MED ORDER — IBUPROFEN 600 MG PO TABS: 600.0000 mg | ORAL_TABLET | Freq: Four times a day (QID) | ORAL | 0 refills | Status: DC | PRN

## 2020-05-10 MED ORDER — ADULT MULTIVITAMIN W/MINERALS CH: 1.0000 | ORAL_TABLET | Freq: Every day | ORAL | Status: DC

## 2020-05-10 MED ORDER — ACETAMINOPHEN 325 MG PO TABS: 650.0000 mg | ORAL_TABLET | Freq: Four times a day (QID) | ORAL | Status: DC | PRN
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
## 2020-05-10 NOTE — Discharge Summary (Incomplete Revision)
Physician Discharge Summary  Anita Davis Bero UEA:[id number] DOB: December 20, 1985 DOA: 01/04/2020  PCP: Anita Davis, Shannon, PA-C     02/10/20                         02/15/20     03/30/2020                    04/08/20   06/02/2020    Admit date: 01/04/2020 Discharge date: 06/28/2020  Time spent: 45 minutes  Recommendations for Outpatient Follow-up:  1. Patient will discharge to to home.  She will have follow-up with [facility] via video visit.  They will contact patient after discharge to arrange for appointment date and time 2. Patient presented with status epilepticus and associated brain injury.  An ambulatory referral will be sent to [facility] for patient undergo neurocognitive evaluation in the outpatient setting 3.   Patient is on multiple psychotropic medications to manage her severe bipolar disorder with schizoaffective features as well as to manage behavior sequela related to brain injury.  [facility] with subsequent prescriptions being sent to patient's community pharmacy of choice.  Discharge Diagnoses:  Principal Problem:   Organic brain syndrome Active Problems:   Polysubstance abuse (HCC)   Transaminitis   Refractory seizure (HCC)   Chronic hepatitis C without hepatic coma (HCC)   Distended abdomen   Palliative care encounter   Colonic Ileus (HCC)   Inadequate oral nutritional intake   Impulse disorder   Severe bipolar I disorder, current or most recent episode depressed (HCC)   Brain injury [facility](HCC)   Discharge Condition: Medically stable  Diet recommendation: Regular  Filed Weights   06/25/20 0516 06/26/20 0612 06/27/20 0515  Weight: 83 kg 83.2 kg 83.5 kg    History of present illness:  35 year old female with history of IV drug abuse/heroin on methadone prior to admission, hepatitis C, anxiety with depression and gestational diabetes.  She presented to [facility] 3 times prior to admission.   On her 4th presentation she was admitted.  On 9/2 she was diagnosed with acute cystitis with hematuria and was discharged home on Keflex and Zofran.  9/9 she presented with anxiety and symptoms consistent with a panic attack.  She was discharged from the ER with prescription for Vistaril.  She again presented to the ER on 9/10  reporting significant anxiety and severe insomnia and stating the Vistaril was not working.  She was given Ativan while in the ER and this appeared to improve her symptoms.  She was stable for discharge and instructed to take Benadryl at bedtime to assist with her insomnia.    On 9/13 patient brought to the ER by family.  She was refusing to answer questions.  Clinician documented sores/lesions on her legs consistent with methamphetamine use.  When questioned regarding this patient reportedly just stared at the clinician.  There were some concerns that patient may have been abusing methamphetamine based on these clinical findings but was never confirmed.  Of note UDS was negative for methamphetamines. Prior to discharge from the ER patient appeared to be hallucinating but was able to verbally communicate.  Over the next hour she became more agitated and was given Ativan 2 mg orally. She later returned to her baseline mentation stating she did not wish to hurt herself or anybody else and requested to be discharged home. [facility] and discharged by  EDP.  Later that same day patient was returned to the ER by the police.  Patient was found laying down in a parking lot and acting bizarre.  She apparently told the police that she had put "meth" in her pants.  Patient kept reaching for vagina and it was suspected she may have placed drugs intravaginally.  No drugs were ever found.  EEG and MRI were completed with no acute abnormalities; urine drug screen positive for THC as well as BZDs (which had been given in the ED).  She underwent a lumbar puncture which was negative and  was empirically started on antibiotics to treat potential infectious etiology to her encephalopathy.  After admission this patient developed seizure activity which progressed to status epilepticus therefore she was transferred to [facility] for further neurological evaluation.  After several days recurrent seizures were aborted with combination of 3 antiepileptic drugs.  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  Unfortunately she did not improve.  Because of persistent altered mentation and inability to sustain appropriate oral intake a PEG tube was placed.  This tube was eventually discontinued after patient became alert and awake enough to eat consistently during the latter portion of the hospitalization.  Of note patient has had episodes of increasing mental alertness and clarity and as of 1/12 patient confirmed that she had abruptly stopped taking her prescribed methadone prior to presentations to the ER and this likely explains some of her abnormal behaviors (methadone withdrawal in conjunction with bipolar disorder) and her status epilepticus (methadone withdrawal)  Since admission she has not had any further seizures so these medications were discontinued.  She has eventually awakened behaviors alternating between awake and appropriate, awake crying and depressed occasionally delusional, severe agitation and aggression with significant delusions.  She has been evaluated both by neurology and psychiatry who suspect a combination of sequelae from underlying brain injury as well as bipolar disorder with schizoaffective features.  During one of her alert phases patient did admit to being diagnosed with bipolar disease prior to admission.  Patient is on multiple psychotropic medications continued to have episodes of violent outbursts.  She had required frequent doses of IM Geodon as well as Retail buyer and frequent intervention by security staff during her episodes of severe agitation.  Recent chart review by [facility] states patient would not be a candidate for ECT given recent brain injury sequela as well as presentation with status epilepticus.  Has been stable regarding extreme agitation and/or violent behaviors since 1/24 when she had received her last Geodon IM dose.  On the evening of 2/4 she was also given a dose of IM Geodon.  There was no chart documentation stating what type of behavior she was experiencing but typically this medication is only given if she has extreme agitation.  Patient has been followed regularly during the hospitalization by our inpatient psychiatric team.  As of 3/4 the team determined that there would be no benefit in transferring patient to [facility] given her extremes in behavior have resolved.  Decision made at that time to allow IVC to expire on 3/8 and to remove sitter from bedside.  Plan at this time is to discharge patient to home environment.  Patient now has active Medicaid in [facility] and this would allow her to obtain appropriate medications at discharge.  Psychiatric team plans to speak with patient's father and stepmother who are also her guardians  to aid in establishing a safety and other discharge plan regarding her psychiatric illness.  Because of her underlying brain injury from status epilepticus she will have an ambulatory referral placed to [facility] so she can undergo neurocognitive evaluation.  Because there is a lack of psychiatric services in the [place] area/[place] area our plan is to have patient follow-up with psychiatrist in the [place] area.  We have asked the psychiatric team to assist in arranging an appointment since this patient likely would need rapid follow-up after discharge i.e. within 2 weeks after discharge.  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 During periods of alertness and lucidity patient denied use of methamphetamines prior to admission although did admit to abruptly stopping her methadone -Remains intermittently oriented with episodes of delusion and lack of insight into her current psychiatric status -3/4 kg pending in regards to following QTC -Continue max dose Geodon 80 mg BID, naltrexone, scheduled Klonopin, Tegretol, Celexa and Remeron at hour of sleep -Continue Tegretol to 200 mg 3 times daily.  Carbamazepine level still slightly elevated at 12.9 with high normal B12; repeat labs in a.m. on 3/9 -On 3/4 was reevaluated by psychiatric team who now recommend discharge to home and allow current IVC to expire and discontinuation of safety sitter. -We will go ahead and complete discharge medicines and sent to [facility] pharmacy to determine out-of-pocket cost. -We will also need to obtain home pharmacy so prescriptions can be faxed there when refills are needed.  Worsening transaminitis in context of hepatitis C -Elevated HCV antibody with markedly elevated HCV RNA quantitative level  consistent with chronic hepatitis C  -11/10: Discussed with ID Dr. Manson Passey. Treatment of hepatitis C typically is initiated in the outpatient setting. Medications can be quite expensive and usually take time to obtain insurance approval.  -ID recommends scheduling outpatient appointment their clinic closer to discharge date -Nominal ultrasound unremarkable -Because of upward trend in LFTs Tegretol dosage has been tapered down from 800 mg  3 times daily to 200 mg 3 times daily with last dosage adjustment on 3/3. -Repeat LFTs 3/3 revealed slight increase in AST and ALT but normalization of TB  Severe insomnia -Continue Remeron, trazodone and melatonin  History of polysubstance abuse with heroin/history of outpatient methadone treatment -Prescribed methadone 60 mg daily from a clinic in [place] prior to admission -Continue naltrexone to treat heroin addiction as well as recent issues regarding impulsivity and hypersexual behaviors.  It is suspected that these issues are related to brain injury and worsening of underlying psychiatric condition   Depression and severe anxiety disorder/PTSD/no bipolar disorder now with schizoaffective features -With maximization of psychotropic medications behaviors have significantly improved without any further violent outbursts for over 6 weeks. -As of 3/4 psychiatry has determined patient no longer appropriate to transfer to [facility] and discharge plan now is to discharge to home with her guardians -Klonopin will be decreased from 1 mg every 6 hours to 0.5 mg every 6 hours as recommended by the psychiatric team  Recurrent constipation -Continue Colace 100 mg twice daily prn -Continue MiraLAX to twice daily  Chest pain secondary  to reflux -Cont Protonix daily -Ck EKG (need to ck Qtc 2/2 Geodon)  Pelvic pain -Reported to staff as 10/10 -ck UA -? Menstrual etiology-patient cannot remember when her last menstrual cycle occurred -Pelvic ultrasound unremarkable and symptoms seem to improve after voiding   Other problems: Status epilepticus -Resolved -Suspect precipitated by abrupt withdrawal of methadone as reported by patient -Repeat MRI on 12/28 within normal limits and it is felt behavioral issues related to brain injury sequelae and chronic psychiatric condition.  Neurology has signed off -Vimpat, Dilantin and phenobarbital have been tapered and discontinued at recommendation of  neurologist  Recurrent UTI during hospitalization -Patient with low-grade fever overnight and is now having back pain for days not responsive to NSAIDs and muscle relaxers -DG lumbar spine unremarkable -During hospitalization patient has been treated for pansensitive E. coli UTI as well as Salmonella UTI both of which have been sensitive to Levaquin -Completed Levaquin 12/20-urine culture obtained after antibiotics initiated and showed no growth -CT abdomen and pelvis on 12/17 unremarkable  Recurrent back pain -Continue OTC ibuprofen after discharge -Prescription given for Robaxin as needed  Salmonella UTI -Completed amoxicillin  Physical deconditioning -Resolved -Does continue to exhibit impulsivity but maintains appropriate balance and gait no longer requires one-to-one safety sitter  Large hemorrhoids. -Resolved -Markedly improved-LD Proctofoam 12/15  Dysphagia -Resolved -Continue regular diet  Procedures:  9/14 lumbar puncture  9/15 EEG  9/16 EEG  9/17 EEG  9/19 overnight EEG with video  9/22 overnight EEG with video with discontinuation of long-term EEG monitoring on 9/25  9/24 core track placement  10/6 EEG  12/15 PEG tube removed  Consultations:  Neurology  Psychiatry  Interventional radiology  Surgery   Discharge Exam: Vitals:   06/27/20 2028 06/28/20 0604  BP: 115/72 113/74  Pulse: (!) 56 (!) 58  Resp: 16 16  Temp: 98 F (36.7 C) 97.9 F (36.6 C)  SpO2: 99% 100%   General: Awake but extremely agitated but began to calm after medications began to take effect. Pulmonary: Bilateral lung sounds clear to auscultation, stable on room air Cardiac: Heart sounds are S1-S2, pulses regular, extremities warm with appropriate capillary refill Abdomen: Soft nontender nondistended, had eaten about 25% of breakfast tray LBM 1/12 Neurological:Moves all extremities x4, no focal neurological deficits, no tremors or rigidity/EPS sx's Psychiatric:  Alert and agitated, actively hallucinating.  Knew she was in the hospital is disoriented to year.  Knows name.  Has no insight into current psychiatric condition.  Repeatedly asking for [name] and Renae Fickle to "come and get me so I can see [name]".  Also pointing to stretch marks on her legs stating "I am the one who gave birth to [place]".  Attempted to reassure patient that even though someone else has custody of her children she will always be their mother.   Discharge Instructions    Allergies as of 06/28/2020   No Known Allergies     Medication List    STOP taking these medications   cephALEXin 500 MG capsule Commonly known as: KEFLEX   cetirizine 10 MG tablet Commonly known as: ZYRTEC   hydrOXYzine 25 MG tablet Commonly known as: ATARAX/VISTARIL   ibuprofen 200 MG tablet Commonly known as: ADVIL   METHADONE HCL PO   ondansetron 4 MG disintegrating tablet Commonly known as: ZOFRAN-ODT     TAKE these medications   carbamazepine 200 MG tablet Commonly known as: TEGRETOL Take 1 tablet (200 mg total) by mouth 3 (three) times daily.   citalopram 40 MG  tablet Commonly known as: CELEXA Take 1 tablet (40 mg total) by mouth daily. Start taking on: June 29, 2020   clonazePAM 0.5 MG tablet Commonly known as: KLONOPIN Take 1 tablet (0.5 mg total) by mouth every 6 (six) hours.   docusate sodium 100 MG capsule Commonly known as: COLACE Take 1 capsule (100 mg total) by mouth 2 (two) times daily.   Melatonin 10 MG Tabs Take 10 mg by mouth at bedtime.   methocarbamol 500 MG tablet Commonly known as: ROBAXIN Take 1 tablet (500 mg total) by mouth every 6 (six) hours as needed for muscle spasms.   mirtazapine 30 MG tablet Commonly known as: REMERON Take 1 tablet (30 mg total) by mouth at bedtime.   multivitamin with minerals Tabs tablet Take 1 tablet by mouth daily.   naltrexone 50 MG tablet Commonly known as: DEPADE Take 0.5 tablets (25 mg total) by mouth daily.    pantoprazole 40 MG tablet Commonly known as: PROTONIX Take 1 tablet (40 mg total) by mouth daily. Start taking on: June 29, 2020   polyethylene glycol 17 g packet Commonly known as: MIRALAX / GLYCOLAX Take 17 g by mouth 2 (two) times daily. What changed:   medication strength  how much to take  when to take this   pyridoxine 100 MG tablet Commonly known as: B-6 Take 1 tablet (100 mg total) by mouth daily.   thiamine 100 MG tablet Take 1 tablet (100 mg total) by mouth daily.   traZODone 150 MG tablet Commonly known as: DESYREL Take 1 tablet (150 mg total) by mouth at bedtime.   ziprasidone 80 MG capsule Commonly known as: GEODON Take 1 capsule (80 mg total) by mouth 2 (two) times daily with a meal.      No Known Allergies  Follow-up Information    Gaynelle Adu, MD. Go on 03/11/2020.   Specialty: General Surgery Why: Follow up appointment scheduled for 2:00 PM. Please arrive 30 min prior to appointment time. G-tube should stay in place 6-8 weeks at least, or longer if you are still needing some nutritional support.  Contact information: [address] [place] [address] [phone]                The results of significant diagnostics from this hospitalization (including imaging, microbiology, ancillary and laboratory) are listed below for reference.    Significant Diagnostic Studies: US Abdomen Complete  Result Date: 06/22/2020 CLINICAL DATA:  Elevated transaminase levels. EXAM: ABDOMEN ULTRASOUND COMPLETE COMPARISON:  None. FINDINGS: Gallbladder: Contracted but otherwise unremarkable. No gallstones or wall thickening visualized. No sonographic Murphy sign noted by sonographer. Common bile duct: Diameter: 4 mm Liver: No focal lesion identified. Within normal limits in parenchymal echogenicity. Portal vein is patent on color Doppler imaging with normal direction of blood flow towards the liver. IVC: No abnormality visualized. Pancreas: Visualized  portion unremarkable. Spleen: Size and appearance within normal limits. Right Kidney: Length: 9.6 cm. Echogenicity within normal limits. No mass or hydronephrosis visualized. Left Kidney: Length: 11.4 cm. Echogenicity within normal limits. No mass or hydronephrosis visualized. Abdominal aorta: No aneurysm visualized. Distal aorta and bifurcation not seen due to overlying bowel gas. Other findings: None. IMPRESSION: No abnormality identified. Electronically Signed   By: Bary Richard M.D.   On: 06/22/2020 17:45   DG Abd 2 Views  Result Date: 05/30/2020 CLINICAL DATA:  Constipation with nausea and vomiting EXAM: ABDOMEN - 2 VIEW COMPARISON:  April 27, 2020. FINDINGS: Frontal and left lateral  decubitus images were obtained. There is diffuse stool throughout the colon and rectal regions. There is no bowel dilatation or air-fluid level suggesting bowel obstruction. No free air. Visualized lung bases are clear. Probable small phleboliths in the pelvis. IMPRESSION: Diffuse stool throughout colon and rectum, an appearance indicative of constipation. No bowel obstruction or free air evident. Electronically Signed   By: Bretta BangWilliam  Woodruff III M.D.   On: 05/30/2020 09:34   US PELVIC COMPLETE W TRANSVAGINAL AND TORSION R/O  Result Date: 06/24/2020 CLINICAL DATA:  Pelvic pain for 2 days, uncertain LMP EXAM: TRANSABDOMINAL AND TRANSVAGINAL ULTRASOUND OF PELVIS DOPPLER ULTRASOUND OF OVARIES TECHNIQUE: Both transabdominal and transvaginal ultrasound examinations of the pelvis were performed. Transabdominal technique was performed for global imaging of the pelvis including uterus, ovaries, adnexal regions, and pelvic cul-de-sac. It was necessary to proceed with endovaginal exam following the transabdominal exam to visualize the uterus, endometrium, and ovaries. Color and duplex Doppler ultrasound was utilized to evaluate blood flow to the ovaries. COMPARISON:  None FINDINGS: Uterus Measurements: 8.3 x 4.6 x 5.4 cm = volume:  108 mL. Variable position, flipped from anteverted to retroverted during exam. No focal mass. Endometrium Thickness: 14 mm.  No endometrial fluid or focal abnormality Right ovary Measurements: 3.4 x 2.9 x 2.6 cm = volume: 13.2 mL. Normal morphology without mass. Blood flow present within RIGHT ovary on color Doppler imaging. Left ovary Measurements: 2.3 x 1.5 x 1.2 cm = volume: 2.3 mL. Normal morphology without mass. Blood flow present within LEFT ovary on color Doppler imaging. Pulsed Doppler evaluation of both ovaries demonstrates normal low-resistance arterial and venous waveforms. Other findings No free pelvic fluid.  No adnexal masses. IMPRESSION: Normal exam. No evidence of ovarian mass or torsion. Electronically Signed   By: Ulyses SouthwardMark  Boles M.D.   On: 06/24/2020 10:13    Microbiology: No results found for this or any previous visit (from the past 240 hour(s)).   Labs: Basic Metabolic Panel: Recent Labs  Lab 06/23/20 1458 06/25/20 0117 06/26/20 0051 06/28/20 0032  NA 139 137 138 137  K 4.4 3.8 4.0 3.8  CL 106 105 104 103  CO2 25 24 27 24   GLUCOSE 127* 156* 123* 96  BUN 14 13 11 15   CREATININE 0.79 0.65 0.65 0.68  CALCIUM 8.8* 8.4* 9.0 8.8*   Liver Function Tests: Recent Labs  Lab 06/23/20 1458 06/25/20 0117 06/26/20 0051 06/28/20 0032  AST 194* 166* 164* 206*  ALT 164* 143* 147* 154*  ALKPHOS 107 126 112 99  BILITOT 1.2 1.5* 1.5* 1.8*  PROT 6.4* 5.7* 5.8* 5.8*  ALBUMIN 3.4* 3.1* 3.3* 3.3*   No results for input(s): LIPASE, AMYLASE in the last 168 hours. No results for input(s): AMMONIA in the last 168 hours. CBC: Recent Labs  Lab 06/23/20 1458 06/25/20 0117  WBC 3.1* 4.0  HGB 13.0 12.7  HCT 39.0 38.4  MCV 94.9 95.8  PLT 262 254   Cardiac Enzymes: No results for input(s): CKTOTAL, CKMB, CKMBINDEX, TROPONINI in the last 168 hours. BNP: BNP (last 3 results) No results for input(s): BNP in the last 8760 hours.  ProBNP (last 3 results) No results for input(s):  PROBNP in the last 8760 hours.  CBG: No results for input(s): GLUCAP in the last 168 hours.     Signed:  Junious Silkllison Ellis ANP [facility] 06/28/2020, 1:29 PM

## 2020-05-10 NOTE — TOC Progression Note (Signed)
Transition of Care [facility]) - Progression Note    Patient Details  Name: Anita Davis MRN: [id number] Date of Birth: [birth year]  Transition of [facility]) CM/SW Contact  Janae Bridgeman, RN Phone Number: 05/10/2020, 10:39 AM  Clinical Narrative:    Case management called and spoke with Daune Perch, RN director at [facility] and he is aware that the patient needs a priority bed as soon as an admission bed becomes available.  No beds are available this morning but the facility has a potential discharge today and [place], RN at [facility] will notify CM and MSW if the patient is able to admit to the facility.  Edwin Dada, MSW is updating the patient's IVC paperwork.  I called [facility] and the patient remains on the facility's priority list for admission.  No beds are available for admission at this time.  CM and MSW will continue to follow the patient for admission and transfer to a psychiatric facility as soon as an admission bed is available at either [facility] or [facility] facilities.   Expected Discharge Plan: Skilled Nursing Facility Barriers to Discharge: Continued Medical Work up,Inadequate or no insurance,Active Substance Use - Placement,SNF Pending payor source - LOG,SNF Pending Medicaid,SNF Pending bed offer  Expected Discharge Plan and Services Expected Discharge Plan: Skilled Nursing Facility In-house Referral: Artist   Post Acute Care Choice: Skilled Nursing Facility Living arrangements for the past 2 months: Mobile Home                                       Social Determinants of Health (SDOH) Interventions    Readmission Risk Interventions No flowsheet data found.

## 2020-05-10 NOTE — Progress Notes (Addendum)
[facility] PROGRESS NOTE  Anita Davis UYQ:[id number] DOB: 10-18-1985 DOA: 01/04/2020 PCP: Anita Hawking, PA-C    02/10/20                      02/15/20                       04/08/20   Status: Remains inpatient appropriate because:Altered mental status, Unsafe d/c plan and Inpatient level of care appropriate due to severity of illness   Dispo: The patient is from: Home              Anticipated d/c is to: SNF              Anticipated d/c date is: > 3 days              Patient currently is medically stable to d/c.  Barriers to discharge; continued high severity psychiatric illness with waxing and waning mentation with violent outburst requiring IVC and anticipated discharge long-term to state psychiatric facility. We are also pursuing [facility] at family request  Patient remains a priority admission at [facility] and [facility].  Her extremes in behavior and orientation/mentation along with violent outbursts including physically attacking staff warrants urgent transfer to inpatient psychiatric unit.    Code Status: DNR Family Communication: Stepmother Anita Davis 1/17 -aware that behavior has not improved and definitely needs aggressive IP psych DVT prophylaxis: Consistently ambulatory so no DVT prophylaxis indicated Vaccination status: Fully vaccinated against Covid with second vaccine given on 02/23/2020  Pertinent social history Patient has 3 children: Anita Davis and Anita Davis Anita Davis's father has primary custody and no one in the family knows where they reside With permission from patient's father and stepmother I contacted [name] (telephone number [phone]) who has legal custody of Anita Davis and Anita Davis; at this juncture Mrs. Anita Davis states that after extensive discussions with Anita Davis he no longer wishes to visit his mother in person.  Anita Davis is only 7 and it is not appropriate for him to visit his mother at this point.  Prior to the patient's acute illness Mrs. Anita Davis states that  Anita Davis had regular frequent supervised visits with these children.  The possibility of children recording video greetings to their mother has been discussed and she will need to discuss this with her husband before agreeing. Mrs. 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  She presented to [facility] ER 3 times prior to admission.  On her 4th presentation she was admitted.  On 9/2 she was diagnosed with acute cystitis with hematuria and was discharged home on Keflex and Zofran.  9/9 she presented with anxiety and symptoms consistent with a panic attack.  She was discharged from the ER with prescription for Vistaril.  She again presented to the ER on 9/10  reporting significant anxiety and severe insomnia and stating the Vistaril was not working.  She was given Ativan while in the ER and this appeared to improve her symptoms.  She was stable for discharge and  instructed to take Benadryl at bedtime to assist with her insomnia.    On 9/13 patient brought to the ER by family.  She was refusing to answer questions.  Clinician documented sores/lesions on her legs consistent with methamphetamine use.  When questioned regarding this patient reportedly just stared at the clinician.  There were some concerns that patient may have been abusing methamphetamine based on these clinical findings but was never confirmed.  Of note UDS was negative for methamphetamines. Prior to discharge from the ER patient appeared to be  hallucinating but was able to verbally communicate.  Over the next hour she became more agitated and was given Ativan 2 mg orally. She later returned to her baseline mentation stating she did not wish to hurt herself or anybody else and requested to be discharged home. She was referred to [facility] and discharged by EDP.  Later that same day patient was returned to the ER by the police.  Patient was found laying down in a parking lot and acting bizarre.  She apparently told the police that she had put "meth" in her pants.  Patient kept reaching for vagina and it was suspected she may have placed drugs intravaginally.  No drugs were ever found.  EEG and MRI were completed with no acute abnormalities; urine drug screen positive for THC as well as BZDs (which had been given in the ED).  She underwent a lumbar puncture which was negative and was empirically started on antibiotics to treat potential infectious etiology to her encephalopathy.  After admission this patient developed seizure activity which progressed to status epilepticus therefore she was transferred to [facility] for further neurological evaluation.  After several days recurrent seizures were aborted with combination of 3 antiepileptic drugs.  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  Unfortunately she did not improve.  Because of persistent altered mentation and inability to sustain appropriate oral intake a PEG tube was placed.  This tube was eventually discontinued after patient became alert and awake enough to eat consistently during the latter portion of the hospitalization.  Of note patient has had episodes of increasing mental alertness and clarity and as of 1/12 patient confirmed that she had abruptly stopped taking her prescribed methadone prior to presentations to the ER and this likely explains some of her abnormal behaviors (methadone  withdrawal in conjunction with bipolar disorder) and her status epilepticus (methadone withdrawal)  Since admission patient has had varying levels of awakeness and orientation as well as labile mood and affect.  Essentially for the first 8 weeks of hospitalization patient would have brief episodes of being awake, sometimes oriented sometimes not.  These episodes would last for several minutes to several hours but were never sustained.  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  Multiple psychotropic medications were tried without success or caused significant side effects and were discontinued.  Decision made to taper and wean all psychotropic drugs.  After this was done patient became extremely alert and agitated.  She was very impulsive and was demonstrating hypersexual behavior as well as extreme mood swings.  Seroquel tried initially to manage brain injury sequela but required high dosages unfortunately contributing to patient's depression and suicidal ideation.  It was noted that each time patient was given IM Geodon that she became alert and for the most part oriented, was able to consistently communicate and perform ADLs.  Eventually she was transitioned to oral Geodon.  Naltrexone had been started to assist with underlying hypersexual behaviors and impulsivity and to treat her underlying heroin addiction since she had not had any methadone or heroin since admission.  She has also been placed on Klonopin and clonidine to aid with her severe anxiety.   Subjective: Arrived to patient room to find security standing outside of room.  Patient once again agitated and aggressive.  Actively hallucinating speaking to people who were not in the room.  Initially told me to get out of her room.  Again has referred to me as "Onalee HuaDavid" and "you are the devil".  She is oriented to location only.  Is extremely paranoid.  Attempted to reassure patient that we are planning on  transitioning her to a psychiatric hospital once a bed is available.  Stated we are planning on evaluating her for ECT and asked her if she knew what that was.  She only responded by stating "you just keep running me in and out of that machine and its messing me up".  By the time I left her room the IM Geodon she had been given began to take effect.  Multiple obstacles on the floor including thrown clothing, crayons and books.  All of this was cleared, meal tray removed from room and bedside table moved so patient could have easy access to bathroom.  Bathroom door open with light on in room lights were turned off to help decrease agitation.  Sitter is sitting outside room for safety.  Patient did allow me to examine her.   1320 return to bedside to reevaluate patient.  She is much more calm, sitting up in bed coloring.  Unfortunately she remains delusional.  She continues to state "I am the devil" and "I am dead".   Objective: Vitals:   05/09/20 1941 05/10/20 0514  BP: 110/74 (!) 107/94  Pulse: 69 73  Resp: 18 17  Temp:  98.1 F (36.7 C)  SpO2: 100% 100%    Intake/Output Summary (Last 24 hours) at 05/10/2020 0736 Last data filed at 05/10/2020 0707 Gross per 24 hour  Intake 460 ml  Output --  Net 460 ml   Filed Weights   04/04/20 [id number] 04/05/20 0613 05/08/20 0500  Weight: 70.9 kg 71.6 kg 74.8 kg    Exam: General: Awake but extremely agitated but began to calm after medications began to take effect. Pulmonary:  Bilateral lung sounds clear to auscultation, stable on room air Cardiac:  Heart sounds are S1-S2, pulses regular, extremities warm with appropriate capillary refill Abdomen: Soft nontender nondistended, had eaten about 25% of breakfast tray LBM 1/12 Neurological: Moves all extremities x4, no focal neurological deficits, no tremors or rigidity/EPS sx's Psychiatric: Alert and agitated, actively hallucinating.  Knew she was in the hospital is disoriented to year.  Knows name.  Has  no insight into current psychiatric condition.  Repeatedly asking for Anita QuinLinda and Renae Fickleaul to "come and get me so I can see [name]".  Also pointing to stretch marks on her legs stating "I am the one who gave birth to [place]".  Attempted to reassure patient that even though someone else has custody of her children she will always be their mother.     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 During periods of alertness and lucidity patient denied use of methamphetamines prior to admission although did admit to abruptly stopping her methadone -Continue Geodon 80 mg BID (dose last increased on 1/3).  -Continue naltrexone 25 mg daily  -Continue Klonopin down to 1 mg every 6 hours -Continue 1:1 safety sitter and restraints as needed for aggressive behavior -In regards to her bizarre behavior she was exhibiting similar symptoms on date of admission and 24 hours previous to admission -Continue Remeron 30 mg HS  -Appreciate assistance of psych team. It appears we have maxed out on all psychotropic medications for this patient.  It is noted that many of these medications will take weeks to months for full therapeutic benefit.  Given her persistent severe behavioral swings that include violent episodes of physically attacking staff she is not a candidate for an SNF and needs more aggressive therapies.  Psychiatry has recommended long-term placement at [facility].  [facility] aware and is beginning this process.  -Patient does not  have chronic seizure disorder but did have status epilepticus in September 2021 likely 2/2 acute methadone withdrawal.  -Behavior continues to very from calm cooperative and oriented to sad and delusional to aggressive/violent and delusional involving attacking staff.  Continue to await acceptance to high-level psychiatric unit that can also provide ECT -Note: both myself, staff and family have noticed that patient's extreme behaviors seem to be precipitated by her ruminating on her children and her inability to see them while she is hospitalized as well as circumstances surrounding her giving up custody of her children several years prior to admission.  Severe insomnia -Continue Remeron and trazodone-we will increase trazodone dosage to 150 mg -Can supplement with Vistaril would need to do so with caution given drug drug interaction that can cause movement disorder -Suspect recurrent insomnia related to patient sleeping more during the day after requiring sedation for aggressive and violent behaviors.  Status epilepticus -See above regarding suspected methamphetamine use prior to admission.  UDS was negative but patient did admit to abruptly stopping methadone preceding hospitalization and this may be reason for onset of seizures i.e. withdrawal seizures -Repeat MRI on 12/28 within normal limits and it is felt behavioral issues related to brain injury sequelae and chronic psychiatric condition.  Neurology has signed off -Vimpat, Dilantin and phenobarbital have been tapered and discontinued at recommendation of neurologist  History of polysubstance abuse with heroin/history of outpatient methadone treatment -Prescribed methadone 60 mg daily from a clinic in [place] prior to admission -Has not required methadone since arrival therefore we will continue naltrexone to treat heroin addiction in addition to impulsivity and hypersexual behaviors.  This is the last drug clinicians utilize to maintain  stability after patients have been successful with methadone.  Would not return to methadone at this juncture if avoidable.    Depression and severe anxiety disorder/PTSD/no bipolar disorder now with schizoaffective features -She continues to demonstrate extremes in behavior: she was either calm and mostly appropriate but mildly confused vs extremely withdrawn sad and crying vs extremely agitated and violent.  Because of this she remains under IVC -Extremes in behavior precipitated by rumination over situation regarding her children which she gave up for adoption and are unable to physically see while hospitalized -Continue Klonopin as above-further tapering at this time -Continue Tegretol 600 mg TID (max dose)-recent LFTs stable -She  was reevaluated by psychiatry who recommended can transition from Tegretol to Depakote if LFTs increase in the context of her underlying hepatitis C -Scheduled Vistaril discontinued due to drug to drug interaction with Geodon -On 1/6 Lexapro discontinued in favor of Celexa by psychiatry team -On 1/12 patient did confirm to me that she has previously been diagnosed with bipolar disorder -See above regarding behavioral manifestations of concomitant brain injury and bipolar disorder with schizoaffective features -Continue IM Geodon every 8-12 hours as needed severe agitation  Recurrent constipation -Continue Colace 100 mg twice daily prn -Continue MiraLAX to twice daily     Other problems: Nonobstructive transaminitis in context of hepatitis C -Elevated HCV antibody with markedly elevated HCV RNA quantitative level  consistent with chronic hepatitis C  -LFTs remain stable without an obstructive pattern noted -11/10: Discussed with ID Dr. Manson PasseyMandahar. Treatment of hepatitis C typically is initiated in the outpatient setting. Medications can be quite expensive and usually take time to obtain insurance approval.  -ID recommends scheduling outpatient appointment  their clinic closer to discharge date  Back pain 2/2 abnormal urinalysis consistent with UTI -Patient with low-grade fever overnight and is now having back pain for days not responsive to NSAIDs and muscle relaxers -DG lumbar spine unremarkable -During hospitalization patient has been treated for pansensitive E. coli UTI as well as Salmonella UTI both of which have been sensitive to Levaquin -Completed Levaquin 12/20-urine culture obtained after antibiotics initiated and showed no growth -CT abdomen and pelvis on 12/17 unremarkable  Salmonella UTI -Completed amoxicillin  Abnormal urinalysis -Not consistent with UTI, was positive for moderate hemoglobin and greater than 50 RBCs with an elevated specific gravity -Suspect dehydration -Patient denies back pain or history of renal calculi although given her confusion unclear if this history is accurate  Physical deconditioning -Resolved -Does continue to exhibit impulsivity but maintains appropriate balance and gait no longer requires one-to-one safety sitter  Large hemorrhoids. -Resolved -Markedly improved-LD Proctofoam 12/15  Dysphagia -Resolved -Continue regular diet   Data Reviewed: Basic Metabolic Panel: No results for input(s): NA, K, CL, CO2, GLUCOSE, BUN, CREATININE, CALCIUM, MG, PHOS in the last 168 hours. Liver Function Tests: No results for input(s): AST, ALT, ALKPHOS, BILITOT, PROT, ALBUMIN in the last 168 hours. No results for input(s): LIPASE, AMYLASE in the last 168 hours. No results for input(s): AMMONIA in the last 168 hours. CBC: No results for input(s): WBC, NEUTROABS, HGB, HCT, MCV, PLT in the last 168 hours. Cardiac Enzymes: No results for input(s): CKTOTAL, CKMB, CKMBINDEX, TROPONINI in the last 168 hours. BNP (last 3 results) No results for input(s): BNP in the last 8760 hours.  ProBNP (last 3 results) No results for input(s): PROBNP in the last 8760 hours.  CBG: No results for input(s): GLUCAP in  the last 168 hours.  No results found for this or any previous visit (from the past 240 hour(s)).   Studies: No results found.  Scheduled Meds: . carbamazepine  600 mg Oral TID  . Chlorhexidine Gluconate Cloth  6 each Topical Daily  . citalopram  20 mg Oral Daily  . clonazePAM  1 mg Oral Q6H  . cloNIDine  0.1 mg Transdermal Weekly  . diclofenac Sodium  4 g Topical QID  . docusate sodium  100 mg Oral BID  . feeding supplement  237 mL Oral BID BM  . melatonin  10 mg Oral QHS  . mirtazapine  30 mg Oral QHS  . multivitamin with minerals  1 tablet Oral Daily  .  naltrexone  25 mg Oral Daily  . polyethylene glycol  17 g Oral BID  . vitamin B-6  100 mg Oral Daily  . sterile water (preservative free)      . sterile water (preservative free)      . thiamine  100 mg Oral Daily  . traZODone  100 mg Oral QHS  . ziprasidone  80 mg Oral 2 times per day   Continuous Infusions:   Principal Problem:   Delirium due to multiple etiologies Active Problems:   Acute metabolic encephalopathy   Hypokalemia   Polysubstance abuse (HCC)   Elevated CK   Transaminitis   Refractory seizure (HCC)   Chronic hepatitis C without hepatic coma (HCC)   Distended abdomen   Palliative care encounter   Colonic Ileus (HCC)   Organic brain syndrome   On enteral nutrition   Physical deconditioning   Protein-calorie malnutrition (HCC)   Inadequate oral nutritional intake   Impulse disorder   Consultants:  Neurology  Psychiatry  Interventional radiology  Surgery    Procedures:  9/14 lumbar puncture  9/15 EEG  9/16 EEG  9/17 EEG  9/19 overnight EEG with video  9/22 overnight EEG with video with discontinuation of long-term EEG monitoring on 9/25  9/24 core track placement  10/6 EEG  12/15 PEG tube removed    Antibiotics: Anti-infectives (From admission, onward)   Start     Dose/Rate Route Frequency Ordered Stop   04/05/20 1415  levofloxacin (LEVAQUIN) tablet 500 mg         500 mg Oral Daily 04/05/20 1327 04/11/20 0911   03/19/20 1000  metroNIDAZOLE (FLAGYL) tablet 500 mg  Status:  Discontinued        500 mg Per Tube 2 times daily 03/19/20 0822 03/23/20 0827   03/18/20 1200  amoxicillin (AMOXIL) 250 MG/5ML suspension 500 mg  Status:  Discontinued        500 mg Per Tube Every 8 hours 03/18/20 0915 03/23/20 0559   03/17/20 1200  cefTRIAXone (ROCEPHIN) 1 g in sodium chloride 0.9 % 100 mL IVPB  Status:  Discontinued        1 g 200 mL/hr over 30 Minutes Intravenous Every 24 hours 03/17/20 1048 03/18/20 0913   03/16/20 1130  metroNIDAZOLE (FLAGYL) 50 mg/ml oral suspension 500 mg  Status:  Discontinued        500 mg Per Tube 2 times daily 03/16/20 1040 03/19/20 0821   03/16/20 1130  fluconazole (DIFLUCAN) 40 MG/ML suspension 152 mg        150 mg Per Tube  Once 03/16/20 1040 03/16/20 1245   02/11/20 1526  ceFAZolin (ANCEF) IVPB 2g/100 mL premix        2 g 200 mL/hr over 30 Minutes Intravenous  Once 02/11/20 1526 02/11/20 1641   02/05/20 0600  ceFAZolin (ANCEF) IVPB 2g/100 mL premix        2 g 200 mL/hr over 30 Minutes Intravenous To Short Stay 02/04/20 1054 02/05/20 0950   01/29/20 0600  ceFAZolin (ANCEF) IVPB 2g/100 mL premix        2 g 200 mL/hr over 30 Minutes Intravenous On call to O.R. 01/28/20 1511 01/30/20 0559   01/28/20 2000  cefTRIAXone (ROCEPHIN) 1 g in sodium chloride 0.9 % 100 mL IVPB        1 g 200 mL/hr over 30 Minutes Intravenous Every 24 hours 01/28/20 1925 02/02/20 0724   01/06/20 0900  cefTRIAXone (ROCEPHIN) 2 g in sodium chloride 0.9 % 100 mL  IVPB  Status:  Discontinued        2 g 200 mL/hr over 30 Minutes Intravenous Every 12 hours 01/06/20 0105 01/06/20 2202   01/06/20 0800  vancomycin (VANCOCIN) IVPB 1000 mg/200 mL premix  Status:  Discontinued       "Followed by" Linked Group Details   1,000 mg 200 mL/hr over 60 Minutes Intravenous Every 12 hours 01/05/20 1904 01/06/20 2202   01/05/20 2000  vancomycin (VANCOREADY) IVPB 1500 mg/300 mL        "Followed by" Linked Group Details   1,500 mg 150 mL/hr over 120 Minutes Intravenous  Once 01/05/20 1904 01/06/20 0127   01/05/20 1830  cefTRIAXone (ROCEPHIN) 2 g in sodium chloride 0.9 % 100 mL IVPB        2 g 200 mL/hr over 30 Minutes Intravenous  Once 01/05/20 1829 01/05/20 2220       Time spent: 25 minutes    [name] ANP  [facility] 7 am - 330 pm/M-F for direct patient care and secure chat Please see Amion for contact information 125 days

## 2020-05-10 NOTE — Discharge Summary (Addendum)
Physician Discharge Summary  Anita Davis XKG:[id number] DOB: [birth year] DOA: 01/04/2020  PCP: [facility]    Admit date: 01/04/2020 Discharge date: 07/02/2020  Time spent: 45 minutes  Recommendations for Outpatient Follow-up:  1. Patient will receive psychiatric follow-up at [facility] in [facility].  Her appointment is March 14. 2. Patient presented with status epilepticus and associated brain injury.  An ambulatory referral has been sent to [facility] for patient undergo neurocognitive evaluation in the outpatient setting; she is also been referred to the brain injury clinic for physician evaluation as well. 3.   Patient is on multiple psychotropic medications to manage her severe bipolar disorder with schizoaffective features as well as to manage behavior sequela related to brain injury.  Initial prescriptions will be filled by [facility] with subsequent prescriptions being sent to patient's community pharmacy of choice. 4.   Application has been submitted to [facility].  This person will be responsible for evaluation and eventual referral to a family care home/group home after discharge. 5.    Patient has been set up with primary care in [facility].  Appointment date and time are included on the discharge instructions. 6-Patient will need LFT in 48 hours.  7-She will need referral to Dentist.   Discharge Diagnoses:  Principal Problem:   Organic brain syndrome Active Problems:   Polysubstance abuse (HCC)   Transaminitis   Refractory seizure (HCC)   Chronic hepatitis C without hepatic coma (HCC)   Distended abdomen   Palliative care encounter   Colonic Ileus (HCC)   Inadequate oral nutritional intake   Impulse disorder   Severe bipolar I disorder, current or most recent episode depressed (HCC)   Brain injury [facility])   Discharge Condition: Medically stable  Diet recommendation: Regular  Filed Weights   06/26/20 0612 06/27/20  0515 07/02/20 0434  Weight: 83.2 kg 83.5 kg 83.6 kg    History of present illness:  35 year old female with history of IV drug abuse/heroin on methadone prior to admission, hepatitis C, anxiety with depression and gestational diabetes.  She presented to [facility] ER 3 times prior to admission.  On her 4th presentation she was admitted.  On 9/2 she was diagnosed with acute cystitis with hematuria and was discharged home on Keflex and Zofran.  9/9 she presented with anxiety and symptoms consistent with a panic attack.  She was discharged from the ER with prescription for Vistaril.  She again presented to the ER on 9/10  reporting significant anxiety and severe insomnia and stating the Vistaril was not working.  She was given Ativan while in the ER and this appeared to improve her symptoms.  She was stable for discharge and instructed to take Benadryl at bedtime to assist with her insomnia.    On 9/13 patient brought to the ER by family.  She was refusing to answer questions.  Clinician documented sores/lesions on her legs consistent with methamphetamine use.  When questioned regarding this patient reportedly just stared at the clinician.  There were some concerns that patient may have been abusing methamphetamine based on these clinical findings but was never confirmed.  Of note UDS was negative for methamphetamines. Prior to discharge from the ER patient appeared to be hallucinating but was able to verbally communicate.  Over the next hour she became more agitated and was given Ativan 2 mg orally. She later returned to her baseline mentation stating she did not wish to hurt herself or anybody else and requested  to be discharged home. [facility] by [facility].  Later that same day patient was returned to the ER by the police.  Patient was found laying down in a parking lot and acting bizarre.  She apparently told the police that she had put "meth" in her pants.  Patient kept reaching  for vagina and it was suspected she may have placed drugs intravaginally.  No drugs were ever found.  EEG and MRI were completed with no acute abnormalities; urine drug screen positive for THC as well as BZDs (which had been given in the ED).  She underwent a lumbar puncture which was negative and was empirically started on antibiotics to treat potential infectious etiology to her encephalopathy.  After admission this patient developed seizure activity which progressed to status epilepticus therefore she was transferred [facility] for further neurological evaluation.  After several days recurrent seizures were aborted with combination of 3 antiepileptic drugs.  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  Unfortunately she did not improve.  Because of persistent altered mentation and inability to sustain appropriate oral intake a PEG tube was placed.  This tube was eventually discontinued after patient became alert and awake enough to eat consistently during the latter portion of the hospitalization.  Of note patient has had episodes of increasing mental alertness and clarity and as of 1/12 patient confirmed that she had abruptly stopped taking her prescribed methadone prior to presentations to the ER and this likely explains some of her abnormal behaviors (methadone withdrawal in conjunction with bipolar disorder) and her status epilepticus (methadone withdrawal)  Since admission she has not had any further seizures so these medications were discontinued.  She has eventually awakened behaviors alternating between awake and appropriate, awake crying and depressed occasionally delusional, severe agitation and aggression with significant delusions.  She has been evaluated both by neurology and psychiatry who suspect a combination of sequelae from underlying brain injury as well as bipolar disorder with  schizoaffective features.  During one of her alert phases patient did admit to being diagnosed with bipolar disease prior to admission.  Patient is on multiple psychotropic medications continued to have episodes of violent outbursts.  She had required frequent doses of IM Geodon as well as Recruitment consultantsafety sitter and frequent intervention by security staff during her episodes of severe agitation.  [facility] states patient would not be a candidate for ECT given recent brain injury sequela as well as presentation with status epilepticus.  Has been stable regarding extreme agitation and/or violent behaviors since 1/24 when she had received her last Geodon IM dose.  On the evening of 2/4 she was also given a dose of IM Geodon.  There was no chart documentation stating what type of behavior she was experiencing but typically this medication is only given if she has extreme agitation.  Patient has been followed regularly during the hospitalization by our inpatient psychiatric team.  As of 3/4 the team determined that there would be no benefit in transferring [facility] to [facility] given her extremes in behavior have resolved.  Decision made at that time to allow IVC to expire on 3/8 and to remove sitter from bedside.  Plan at this time is to discharge patient to home environment.  Patient now [place] in [place] and this would allow her to obtain appropriate medications at discharge.  As of 3/10  patient has a psychiatrist appointment scheduled for March 24 with [name] in [facility], we have also sent ambulatory referrals to [facility] for neurocognitive evaluation with her psychology team as well as follow-up with her brain injury physician for brain injury rehab.  Extensive conference was held per myself, Marcelino Duster Case Production designer, theatre/television/film and Kappa Callas.  A safety plan was explored and discussed with the family and will be included in the discharge information.  Discussion regarding medications also help  with family.  TOC provided initial prescriptions and have been dispensed to patient's family today.  Additional prescriptions have been electronically submitted to her local pharmacy in [place] city.  Patient will discharge home on 3/11 after the home has been prepared for [facility] to arrive including the recommended safety precautions.    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 During periods of alertness and lucidity patient denied use of methamphetamines prior to admission although did admit to abruptly stopping her methadone -Remains intermittently oriented with episodes of delusion and lack of insight into her current psychiatric status -EKG on 3/10 with QTC of 430 MS -Continue max dose Geodon 80 mg BID, naltrexone, scheduled Klonopin, Celexa and Remeron at hour of sleep -3/9: Due to worsening transaminitis stable total bilirubin discussed with psych team and Tegretol discontinued on 3/9.started on Trileptal 150 mg twice daily to be continued after discharge -On 3/4 was reevaluated by psychiatric team who now recommend discharge to home and allow current IVC to expire and discontinuation of safety sitter. -Ambulatory referral to [facility] sent in regards to patient requirement for neurocognitive evaluation in the outpatient setting rehab physician follow-up in regards to her brain injury -Patient has been given an appointment with [facility] psychiatrist for Monday 3/14 if she is able to discharge over the  weekend.  If family continues to have concerns over discharge will cancel this appointment and reschedule once definitive discharge date determined  Worsening transaminitis in context of hepatitis C -Elevated HCV antibody with markedly elevated HCV RNA quantitative level  consistent with chronic hepatitis C  -11/10: Discussed with ID Dr. Manson Passey. Treatment of hepatitis C typically is initiated in the outpatient setting. Medications can be quite expensive and usually take time to obtain insurance approval.  -ID recommends scheduling outpatient appointment their clinic closer to discharge date -Abdominal ultrasound unremarkable -AST and ALT continue to rise with stable total bilirubin 1.4 AS OF 3/10.  -Discussed with psych team and decision made to discontinue Tegretol on 3/9 -On 3/10 discussed with pharmacist and according to the literature Tegretol associated liver toxicity appears to take 2 - 3 weeks to start to see LFTs coming down and then complete resolution in ~ 3 months. -Patient also evaluated by gastroenterology on 3/11.  Cleared to discharge and okay for additional outpatient follow-up regarding LFTs. -Needs LFT in 48 hours   Severe insomnia -Continue Remeron, trazodone and melatonin  History of polysubstance abuse with heroin/history of outpatient methadone treatment -Prescribed methadone 60 mg daily from a clinic in [place] prior to admission -Continue naltrexone to treat heroin addiction as well as recent issues regarding impulsivity and hypersexual  behaviors.  It is suspected that these issues are related to brain injury and worsening of underlying psychiatric condition   Depression and severe anxiety disorder/PTSD/no bipolar disorder now with schizoaffective features -Extremes in behavior have significantly improved-no longer under IVC -See above regarding pharmacotherapy  Recurrent constipation -Continue Colace 100 mg twice daily prn -Continue MiraLAX to twice  daily  Chest pain secondary to reflux -Cont Protonix daily  Pelvic pain -Pelvic ultrasound unremarkable and symptoms seem to improve after voiding   Other problems: Status epilepticus -Resolved -Suspect precipitated by abrupt withdrawal of methadone as reported by patient -Repeat MRI on 12/28 within normal limits and it is felt behavioral issues related to brain injury sequelae and chronic psychiatric condition.  Neurology has signed off -Vimpat, Dilantin and phenobarbital have been tapered and discontinued at recommendation of neurologist  Recurrent UTI during hospitalization -Patient with low-grade fever overnight and is now having back pain for days not responsive to NSAIDs and muscle relaxers -DG lumbar spine unremarkable -During hospitalization patient has been treated for pansensitive E. coli UTI as well as Salmonella UTI both of which have been sensitive to Levaquin -Completed Levaquin 12/20-urine culture obtained after antibiotics initiated and showed no growth -CT abdomen and pelvis on 12/17 unremarkable  Recurrent back pain -Continue OTC ibuprofen after discharge -Prescription given for Robaxin as needed  Salmonella UTI -Completed amoxicillin  Physical deconditioning -Resolved -Does continue to exhibit impulsivity but maintains appropriate balance and gait no longer requires one-to-one safety sitter  Large hemorrhoids. -Resolved -Markedly improved-LD Proctofoam 12/15  Dysphagia -Resolved -Continue regular diet  Oral pain; she has cavities and poor dentition. No abscess notice on oral exam today.  Started on augmentin for 5 days, Mouth wash.  Needs follow up with dentist.  Of noted she has had similar complaints during admission, CT maxillo was negative for abscess.   Procedures:  9/14 lumbar puncture  9/15 EEG  9/16 EEG  9/17 EEG  9/19 overnight EEG with video  9/22 overnight EEG with video with discontinuation of long-term EEG monitoring on  9/25  9/24 core track placement  10/6 EEG  12/15 PEG tube removed  Consultations:  Neurology  Psychiatry  Interventional radiology  Surgery   Discharge Exam: Vitals:   07/01/20 2123 07/02/20 0434  BP: 100/68 102/77  Pulse: 62 (!) 55  Resp: 18 18  Temp: 98 F (36.7 C) 97.8 F (36.6 C)  SpO2: 98% 98%   General: Awake, alert and calm in no acute distress Pulmonary: Bilateral lung sounds clear to auscultation, stable on room air Cardiac: Heart sounds are S1-S2, pulses regular, extremities warm with appropriate capillary refill Abdomen: Soft nontender, nondistended with excellent oral intake.  LBM 3/7 Neurological:Moves all extremities x4, no focal neurological deficits, no tremors or rigidity/EPS sx's Psychiatric:  Awake, alert and oriented x3.  Earlier in the day on 3/11 app provider gave [facility] a list of requirements that she would have to adhere to at home.  When provider return to the room with patient's parents on the afternoon of 3/11 patient was able to 100% repeat back all of these requirements.   Discharge Instructions   Discharge Instructions    Ambulatory referral to Physical Medicine Rehab   Complete by: As directed    Patient presented with status epilepticus and clinical evaluation consistent with associated brain injury.  During hospitalization I discussed this patient's case with Pam as well as Dr. Riley Kill who agreed patient would benefit from neurocognitive evaluation after discharge to home.  In addition her  underlying bipolar disorder has been affected by this brain injury and she now has schizoaffective features is on a multitude of medications for this   Ambulatory referral to Physical Medicine Rehab   Complete by: As directed    OOPS! Left out contact info-pt's dad and step mom are her guardians-please contact [name] at [phone] with appointment date and time-THANKS!!!   Ambulatory referral to Physical Medicine Rehab   Complete by: As  directed    Pt admitted with status epilepticus and associated brain injury.  Patient with known history of bipolar disorder and brain injury has exacerbated underlying psychiatric issues and had been experiencing schizoaffective symptoms during the hospitalization.  I had previously spoken with Pam as well as Dr. Riley Kill who recommended outpatient follow-up with rehabilitation physician regarding management of brain injury sequela.  In addition patient has her dad and stepmom as a guardian.  Please contact [name] (stepmom) at [phone] to arrange appointment date and time   Diet - low sodium heart healthy   Complete by: As directed    Diet general   Complete by: As directed    Discharge instructions   Complete by: As directed    1.  In preparation for bringing [place] home please try to provide a safe environment as we discussed.  Initially until we are assured [name] will not demonstrate self-harm behaviors it is recommended that you lock up all knives and sharp objects as well as guns in the gun safe or other secure location.  Please lock up all of her medications and administer to her.  Do not allow her to self administer her medications.  Since [name] is on multiple medications you likely will need to purchase a notebook or other type of medication organizer to keep up with when medications are due and when they have been given. 2.  It is also recommended that you obtain some sort of alarm device that will activate when doors or windows are opened. 3.  As previously discussed you are in control of [name]'s behaviors.  You have the right as her guardians to not allow certain people to visit.  If [name] has a mental break/behavioral manifestation that is concerning for harm to you or to herself please secure [name] in a safe location.  If you feel [name] may hurt you secure yourself in a safe location.  Please contact police.  If [name] does become violent you will need to likely pursue IVC and  hospitalization. 4.  With her brain injury [name] can be like a dementia patient although her short-term memory varies.  As you saw when we discussed with her the discharge plan and expectations she was able to repeat back to me exactly what I had told her earlier in the day.  Safety options that are used for dementia patients will be helpful in managing [name]. 5.  After discharge her psychiatrist will be responsible for assisting in managing her medications as well as giving direction as to managing [name]'s day-to-day behaviors, etc.  She has also been referred to the [facility] neurocognitive clinic as well as to the brain injury clinic.  These providers will also assist in helping manage his recovery. 6.  As we have discussed [name] has 2 major triggers: Talking about her children and religion.  You likely will receive direction from the psychiatrist and other providers after discharge as to when [name] can potentially attend church.  Her children were a significant trigger therefore it may  take some time before the subject of her children can be discussed with her. 7.  Prior to discharge and application was completed and faxed to a care coordinator.  This person is responsible for assisting the family in regards to evaluation and potential placement in a family care home/group home after discharge.  This process can take some time.  The care coordinator will eventually contact you with further details after discharge.   Discharge wound care:   Complete by: As directed    As above   Increase activity slowly   Complete by: As directed    Increase activity slowly   Complete by: As directed    No wound care   Complete by: As directed      Allergies as of 07/02/2020   No Known Allergies     Medication List    STOP taking these medications   cephALEXin 500 MG capsule Commonly known as: KEFLEX   cetirizine 10 MG tablet Commonly known as: ZYRTEC   hydrOXYzine 25 MG  tablet Commonly known as: ATARAX/VISTARIL   ibuprofen 200 MG tablet Commonly known as: ADVIL   METHADONE HCL PO   ondansetron 4 MG disintegrating tablet Commonly known as: ZOFRAN-ODT     TAKE these medications   amoxicillin-clavulanate 875-125 MG tablet Commonly known as: AUGMENTIN Take 1 tablet by mouth every 12 (twelve) hours for 5 days.   antiseptic oral rinse Liqd 15 mLs by Mouth Rinse route as needed for dry mouth.   citalopram 40 MG tablet Commonly known as: CELEXA Take 1 tablet (40 mg total) by mouth daily.   clonazePAM 0.5 MG tablet Commonly known as: KLONOPIN Take 1 tablet (0.5 mg total) by mouth every 6 (six) hours.   docusate sodium 100 MG capsule Commonly known as: COLACE Take 1 capsule (100 mg total) by mouth 2 (two) times daily.   Melatonin 10 MG Tabs Take 10 mg by mouth at bedtime.   methocarbamol 500 MG tablet Commonly known as: ROBAXIN Take 1 tablet (500 mg total) by mouth every 6 (six) hours as needed for muscle spasms.   mirtazapine 30 MG tablet Commonly known as: REMERON Take 1 tablet (30 mg total) by mouth at bedtime.   multivitamin with minerals Tabs tablet Take 1 tablet by mouth daily.   naltrexone 50 MG tablet Commonly known as: DEPADE Take 0.5 tablets (25 mg total) by mouth daily.   OXcarbazepine 150 MG tablet Commonly known as: TRILEPTAL Take 1 tablet (150 mg total) by mouth 2 (two) times daily.   pantoprazole 40 MG tablet Commonly known as: PROTONIX Take 1 tablet (40 mg total) by mouth daily.   polyethylene glycol 17 g packet Commonly known as: MIRALAX / GLYCOLAX Take 17 g by mouth 2 (two) times daily. What changed:   medication strength  how much to take  when to take this   pyridoxine 100 MG tablet Commonly known as: B-6 Take 1 tablet (100 mg total) by mouth daily.   thiamine 100 MG tablet Take 1 tablet (100 mg total) by mouth daily.   traZODone 150 MG tablet Commonly known as: DESYREL Take 1 tablet (150 mg  total) by mouth at bedtime.   ziprasidone 80 MG capsule Commonly known as: GEODON Take 1 capsule (80 mg total) by mouth 2 (two) times daily with a meal.            Discharge Care Instructions  (From admission, onward)         Start  Ordered   07/02/20 0000  Discharge wound care:       Comments: As above   07/02/20 0848         No Known Allergies  Follow-up Information    [facility]. Go on 07/15/2020.   Specialty: Family Medicine Why: You are scheduled for a hospital follow up on July 15, 2020 on 10 am.   Contact information: [address] [place] [address] [phone]        [facility] Follow up.   Specialty: Rehabilitation Why: The outpatient PT/OT clinic will be calling to schedule outpatient therapy times. Contact information: [address] [id number] mc [address] [place] [address] [phone]       [facility], [name] Follow up.   Why: You have an appointment scheduled with the [facility] in [place], [place] location - scheduled for Monday, 07/04/2020 at 0830.  Please be at the appointment at 0800 to fill out necessary paperwork - [phone], [address] [place], [place] Contact information: [address] [place] [address] [phone]        [facility]. Call.   Specialty: Physical Medicine and Rehabilitation Why: Ambulatory referrals have been sent to the neuro psychologist and the brain injury rehab doctor. They will call you with appointment date and time Contact information: [address] [place] [address] [id number] mc [place] [address] [phone]               The results of significant diagnostics from this hospitalization (including imaging, microbiology, ancillary and laboratory) are listed below for reference.    Significant Diagnostic Studies: US Abdomen Complete  Result  Date: 06/22/2020 CLINICAL DATA:  Elevated transaminase levels. EXAM: ABDOMEN ULTRASOUND COMPLETE COMPARISON:  None. FINDINGS: Gallbladder: Contracted but otherwise unremarkable. No gallstones or wall thickening visualized. No sonographic Murphy sign noted by sonographer. Common bile duct: Diameter: 4 mm Liver: No focal lesion identified. Within normal limits in parenchymal echogenicity. Portal vein is patent on color Doppler imaging with normal direction of blood flow towards the liver. IVC: No abnormality visualized. Pancreas: Visualized portion unremarkable. Spleen: Size and appearance within normal limits. Right Kidney: Length: 9.6 cm. Echogenicity within normal limits. No mass or hydronephrosis visualized. Left Kidney: Length: 11.4 cm. Echogenicity within normal limits. No mass or hydronephrosis visualized. Abdominal aorta: No aneurysm visualized. Distal aorta and bifurcation not seen due to overlying bowel gas. Other findings: None. IMPRESSION: No abnormality identified. Electronically Signed   By: Bary Richard M.D.   On: 06/22/2020 17:45   US PELVIC COMPLETE W TRANSVAGINAL AND TORSION R/O  Result Date: 06/24/2020 CLINICAL DATA:  Pelvic pain for 2 days, uncertain LMP EXAM: TRANSABDOMINAL AND TRANSVAGINAL ULTRASOUND OF PELVIS DOPPLER ULTRASOUND OF OVARIES TECHNIQUE: Both transabdominal and transvaginal ultrasound examinations of the pelvis were performed. Transabdominal technique was performed for global imaging of the pelvis including uterus, ovaries, adnexal regions, and pelvic cul-de-sac. It was necessary to proceed with endovaginal exam following the transabdominal exam to visualize the uterus, endometrium, and ovaries. Color and duplex Doppler ultrasound was utilized to evaluate blood flow to the ovaries. COMPARISON:  None FINDINGS: Uterus Measurements: 8.3 x 4.6 x 5.4 cm = volume: 108 mL. Variable position, flipped from anteverted to retroverted during exam. No focal mass. Endometrium Thickness: 14 mm.   No endometrial fluid or focal abnormality Right ovary Measurements: 3.4 x 2.9 x 2.6 cm = volume: 13.2 mL. Normal morphology without mass. Blood flow present within RIGHT  ovary on color Doppler imaging. Left ovary Measurements: 2.3 x 1.5 x 1.2 cm = volume: 2.3 mL. Normal morphology without mass. Blood flow present within LEFT ovary on color Doppler imaging. Pulsed Doppler evaluation of both ovaries demonstrates normal low-resistance arterial and venous waveforms. Other findings No free pelvic fluid.  No adnexal masses. IMPRESSION: Normal exam. No evidence of ovarian mass or torsion. Electronically Signed   By: Ulyses Southward M.D.   On: 06/24/2020 10:13    Microbiology: No results found for this or any previous visit (from the past 240 hour(s)).   Labs: Basic Metabolic Panel: Recent Labs  Lab 06/26/20 0051 06/28/20 0032 06/29/20 0129 06/30/20 0119 07/01/20 0148  NA 138 137 138 138 137  K 4.0 3.8 4.0 3.9 4.0  CL 104 103 103 103 104  CO2 27 24 26 27 25   GLUCOSE 123* 96 101* 110* 93  BUN 11 15 14 13 10   CREATININE 0.65 0.68 0.71 0.71 0.72  CALCIUM 9.0 8.8* 9.2 8.9 9.0   Liver Function Tests: Recent Labs  Lab 06/26/20 0051 06/28/20 0032 06/29/20 0129 06/30/20 0119 07/01/20 0148  AST 164* 206* 296* 348* 318*  ALT 147* 154* 193* 226* 230*  ALKPHOS 112 99 101 117 117  BILITOT 1.5* 1.8* 1.5* 1.4* 1.6*  PROT 5.8* 5.8* 6.0* 6.0* 6.1*  ALBUMIN 3.3* 3.3* 3.4* 3.4* 3.4*   No results for input(s): LIPASE, AMYLASE in the last 168 hours. No results for input(s): AMMONIA in the last 168 hours. CBC: No results for input(s): WBC, NEUTROABS, HGB, HCT, MCV, PLT in the last 168 hours. Cardiac Enzymes: No results for input(s): CKTOTAL, CKMB, CKMBINDEX, TROPONINI in the last 168 hours. BNP: BNP (last 3 results) No results for input(s): BNP in the last 8760 hours.  ProBNP (last 3 results) No results for input(s): PROBNP in the last 8760 hours.  CBG: No results for input(s): GLUCAP in the last  168 hours.     Signed:  Alba Cory MD [facility] 07/02/2020, 8:52 AM

## 2020-05-10 NOTE — TOC Progression Note (Addendum)
Transition of Care [facility]) - Progression Note    Patient Details  Name: Terril Amaro MRN: [id number] Date of Birth: 19-Dec-1985  Transition of Care [facility]) CM/SW Contact  Janae Bridgeman, RN Phone Number: 05/10/2020, 2:21 PM  Clinical Narrative:    Case management left a message with Daune Perch, RN director of [facility] checking on availability of admission beds at the facility.  CM and MSW will continue to follow for admission to [facility] or [facility].  05/10/2020 1456-  I spoke with Daune Perch, RN director at [facility] and unfortunately they are unable to offer an admission bed to the patient and he states that after speaking with Dr. Neale Burly and Vi-Anne, the best plan for the patient will be to wait for an appropriate admission bed at [facility] due to her behaviors, medications, need for ECT and history.  I spoke with Derrek Gu, Emergency planning/management officer on [address] and she is aware that the patient will be unable to transfer to [facility] and is waiting on an appropriate psychiatry bed at [facility].   Expected Discharge Plan: Skilled Nursing Facility Barriers to Discharge: Continued Medical Work up,Inadequate or no insurance,Active Substance Use - Placement,SNF Pending payor source - LOG,SNF Pending Medicaid,SNF Pending bed offer  Expected Discharge Plan and Services Expected Discharge Plan: Skilled Nursing Facility In-house Referral: Artist   Post Acute Care Choice: Skilled Nursing Facility Living arrangements for the past 2 months: Mobile Home                                       Social Determinants of Health (SDOH) Interventions    Readmission Risk Interventions No flowsheet data found.

## 2020-05-10 NOTE — Progress Notes (Signed)
CSW completed IVC paperwork, copies placed in patient's chart.  CSW sent updated clinicals to [facility] for review.  Edwin Dada, MSW, LCSW-A Transitions of Care  Clinical Social Worker I [phone]

## 2020-05-11 MED ORDER — BENZOCAINE 10 % MT GEL: Freq: Four times a day (QID) | OROMUCOSAL | Status: DC | PRN

## 2020-05-11 MED ORDER — LORATADINE 10 MG PO TABS
10.0000 mg | ORAL_TABLET | Freq: Every day | ORAL | Status: DC | PRN
  Administered 2020-05-11: 10 mg via ORAL
  Filled 2020-05-11: qty 1

## 2020-05-11 MED ORDER — AMOXICILLIN-POT CLAVULANATE 500-125 MG PO TABS
1.0000 | ORAL_TABLET | Freq: Three times a day (TID) | ORAL | Status: AC
  Administered 2020-05-11 – 2020-05-17 (×19): 500 mg via ORAL
  Filled 2020-05-11 (×21): qty 1

## 2020-05-11 MED ORDER — SALINE SPRAY 0.65 % NA SOLN
1.0000 | NASAL | Status: DC | PRN
  Filled 2020-05-11: qty 44

## 2020-05-11 MED ORDER — LORATADINE 10 MG PO TABS: 10.0000 mg | ORAL_TABLET | Freq: Every day | ORAL | Status: DC

## 2020-05-11 MED ORDER — METRONIDAZOLE 500 MG PO TABS
500.0000 mg | ORAL_TABLET | Freq: Three times a day (TID) | ORAL | Status: AC
  Administered 2020-05-11 – 2020-05-17 (×19): 500 mg via ORAL
  Filled 2020-05-11 (×20): qty 1

## 2020-05-11 NOTE — Progress Notes (Signed)
CSW spoke with [facility] staff who confirmed that updated clinicals were received and stated there are no beds available today.  Edwin Dada, MSW, LCSW-A Transitions of Care  Clinical Social Worker I [phone]

## 2020-05-11 NOTE — Progress Notes (Addendum)
Occupational Therapy Treatment [name] MRN: [id number] DOB: 1985/12/14 Today's Date: 05/11/2020    History of present illness 35 year old with past medical history significant for hepatitis C, polysubstance abuse brought to the hospital 9/13 for disorientation which was suspected to be substance abuse.  After she became more lucid, she was discharged, but then police brought her back later in the day and they found her passed out in a parking lot.  Urine drug screen was positive for benzodiazepine and THC. An EEG done on 9/15 showed patient was in status epilepticus.  Patient was a started on Keppra.  Despite these, EEG noted continued seizures requiring additional medications as well.  Finally on 9/18, seizures broke. Follow-up EEG on 9/20 noted evidence of epilepticity from the left central temporal region.  Repeat EEG done on 9/22 noted epileptogenicity from the left central temporal region.  Pt with PEG placed on 02/05/20 removed on 12/15   OT comments  Pt supine in bed upon arrival agreeable to working with OT. Focus of session on cognitive tasks including pathfinding and word finding worksheets. Pt initiating and carrying out completion of worksheets with only min cues required. Pt requiring increased time for visual scanning/locating words when presented with more challenging worksheets. Pt overall with flat affect during session, following instruction throughout session when provided. Noted plans for transitioning to inpatient psych setting at time of discharge therefore d/c recommendations have been updated. Acute OT to follow however suspect pt likely nearing discharge from acute OT given recent/steady progress.    Follow Up Recommendations  Other (comment) (plans for d/c to inpatient psych)    Equipment Recommendations  None recommended by OT          Precautions / Restrictions Precautions Precautions: Fall Precaution Comments: seizures, Recruitment consultant, has  been combative Restrictions Weight Bearing Restrictions: No       Mobility Bed Mobility Overal bed mobility: Independent             General bed mobility comments: tranisitiong to long sitting without any assist or difficulty         Balance     Sitting balance-Leahy Scale: Good                                     ADL either performed or assessed with clinical judgement   ADL Overall ADL's : Needs assistance/impaired                                       General ADL Comments: todays' focus on cognitive tasks, issued pathfinding puzzle and multiple word search puzzles with graded difficulty. pt completing pathfinding worksheet and 1.5 word search sheets during session, left remaining worksheets with pt to continue working on as she was able to initiate and progress through them without assist today               Cognition Arousal/Alertness: Awake/alert Behavior During Therapy: Flat affect                                   General Comments: pt with flat affect but cooperative during session and participatory. states "they call me the devil" "the girls out there they just laugh at me" with OT providing positive/encouraging  redirection as able.        Exercises     Shoulder Instructions       General Comments      Pertinent Vitals/ Pain       Pain Assessment: Faces Faces Pain Scale: No hurt Pain Intervention(s): Monitored during session  Home Living                                          Prior Functioning/Environment              Frequency  Min 1X/week        Progress Toward Goals  OT Goals(current goals can now be found in the care plan section)  Progress towards OT goals: Progressing toward goals  Acute Rehab OT Goals Patient Stated Goal: none stated OT Goal Formulation: With patient Time For Goal Achievement: 05/20/20 Potential to Achieve Goals: Good ADL Goals Pt  Will Perform Eating: Independently Pt Will Perform Grooming: Independently;standing Additional ADL Goal #1: pt will follow multistep pathfiding task with written instructions Independently Additional ADL Goal #2: Pt will complete full ADL at sink level Independently Additional ADL Goal #3: Pt will gather all ADL and toiletry items in room in prep for bathing and dressing  Plan Discharge plan needs to be updated    Co-evaluation                 AM-PAC OT "6 Clicks" Daily Activity     Outcome Measure   Help from another person eating meals?: None Help from another person taking care of personal grooming?: None Help from another person toileting, which includes using toliet, bedpan, or urinal?: None Help from another person bathing (including washing, rinsing, drying)?: None Help from another person to put on and taking off regular upper body clothing?: None Help from another person to put on and taking off regular lower body clothing?: None 6 Click Score: 24    End of Session    OT Visit Diagnosis: Unsteadiness on feet (R26.81);Muscle weakness (generalized) (M62.81);Other symptoms and signs involving the nervous system (R29.898);Other symptoms and signs involving cognitive function;Ataxia, unspecified (R27.0);Feeding difficulties (R63.3);Other abnormalities of gait and mobility (R26.89)   Activity Tolerance Patient tolerated treatment well   Patient Left in bed;with call bell/phone within reach;with nursing/sitter in room (safety sitter present)   Nurse Communication Mobility status        Time: 1610-9604 OT Time Calculation (min): 14 min  Charges: OT General Charges $OT Visit: 1 Visit OT Treatments $Therapeutic Activity: 8-22 mins  Marcy Siren, OT Acute Rehabilitation Services Pager [phone] Office [phone]    Orlando Penner 05/11/2020, 5:02 PM

## 2020-05-11 NOTE — Progress Notes (Signed)
Pt had been complaining of tooth ache for a few days, concerned for presence of abscess. Pain med given as ordered.

## 2020-05-11 NOTE — Progress Notes (Signed)
[facility] PROGRESS NOTE  Anita Davis UYQ:[id number] DOB: 10-18-1985 DOA: 01/04/2020 PCP: [name], PA-C    02/10/20                      02/15/20                       04/08/20   Status: Remains inpatient appropriate because:Altered mental status, Unsafe d/c plan and Inpatient level of care appropriate due to severity of illness   Dispo: The patient is from: Home              Anticipated d/c is to: SNF              Anticipated d/c date is: > 3 days              Patient currently is medically stable to d/c.  Barriers to discharge; continued high severity psychiatric illness with waxing and waning mentation with violent outburst requiring IVC and anticipated discharge long-term to state psychiatric facility. We are also pursuing [facility] at family request  Patient remains a priority admission at [facility] and [facility].  Her extremes in behavior and orientation/mentation along with violent outbursts including physically attacking staff warrants urgent transfer to inpatient psychiatric unit.    Code Status: DNR Family Communication: Stepmother [name] 1/17 -aware that behavior has not improved and definitely needs aggressive IP psych DVT prophylaxis: Consistently ambulatory so no DVT prophylaxis indicated Vaccination status: Fully vaccinated against Covid with second vaccine given on 02/23/2020  Pertinent social history Patient has 3 children: [name] and [name]'s father has primary custody and no one in the family knows where they reside With permission from patient's father and stepmother I contacted [name] (telephone number [phone]) who has legal custody of [name] and [name]; at this juncture [name] states that after extensive discussions with [name] he no longer wishes to visit his mother in person.  [name] is only 7 and it is not appropriate for him to visit his mother at this point.  Prior to the patient's acute illness [name] states that  [name] had regular frequent supervised visits with these children.  The possibility of children recording video greetings to their mother has been discussed and she will need to discuss this with her husband before agreeing. 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  She presented to [facility] ER 3 times prior to admission.  On her 4th presentation she was admitted.  On 9/2 she was diagnosed with acute cystitis with hematuria and was discharged home on Keflex and Zofran.  9/9 she presented with anxiety and symptoms consistent with a panic attack.  She was discharged from the ER with prescription for Vistaril.  She again presented to the ER on 9/10  reporting significant anxiety and severe insomnia and stating the Vistaril was not working.  She was given Ativan while in the ER and this appeared to improve her symptoms.  She was stable for discharge and  instructed to take Benadryl at bedtime to assist with her insomnia.    On 9/13 patient brought to the ER by family.  She was refusing to answer questions.  Clinician documented sores/lesions on her legs consistent with methamphetamine use.  When questioned regarding this patient reportedly just stared at the clinician.  There were some concerns that patient may have been abusing methamphetamine based on these clinical findings but was never confirmed.  Of note UDS was negative for methamphetamines. Prior to discharge from the ER patient appeared to be  hallucinating but was able to verbally communicate.  Over the next hour she became more agitated and was given Ativan 2 mg orally. She later returned to her baseline mentation stating she did not wish to hurt herself or anybody else and requested to be discharged home. She was referred to [facility] and discharged by EDP.  Later that same day patient was returned to the ER by the police.  Patient was found laying down in a parking lot and acting bizarre.  She apparently told the police that she had put "meth" in her pants.  Patient kept reaching for vagina and it was suspected she may have placed drugs intravaginally.  No drugs were ever found.  EEG and MRI were completed with no acute abnormalities; urine drug screen positive for THC as well as BZDs (which had been given in the ED).  She underwent a lumbar puncture which was negative and was empirically started on antibiotics to treat potential infectious etiology to her encephalopathy.  After admission this patient developed seizure activity which progressed to status epilepticus therefore she was transferred to [facility] for further neurological evaluation.  After several days recurrent seizures were aborted with combination of 3 antiepileptic drugs.  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  Unfortunately she did not improve.  Because of persistent altered mentation and inability to sustain appropriate oral intake a PEG tube was placed.  This tube was eventually discontinued after patient became alert and awake enough to eat consistently during the latter portion of the hospitalization.  Of note patient has had episodes of increasing mental alertness and clarity and as of 1/12 patient confirmed that she had abruptly stopped taking her prescribed methadone prior to presentations to the ER and this likely explains some of her abnormal behaviors (methadone  withdrawal in conjunction with bipolar disorder) and her status epilepticus (methadone withdrawal)  Since admission patient has had varying levels of awakeness and orientation as well as labile mood and affect.  Essentially for the first 8 weeks of hospitalization patient would have brief episodes of being awake, sometimes oriented sometimes not.  These episodes would last for several minutes to several hours but were never sustained.  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  Multiple psychotropic medications were tried without success or caused significant side effects and were discontinued.  Decision made to taper and wean all psychotropic drugs.  After this was done patient became extremely alert and agitated.  She was very impulsive and was demonstrating hypersexual behavior as well as extreme mood swings.  Seroquel tried initially to manage brain injury sequela but required high dosages unfortunately contributing to patient's depression and suicidal ideation.  It was noted that each time patient was given IM Geodon that she became alert and for the most part oriented, was able to consistently communicate and perform ADLs.  Eventually she was transitioned to oral Geodon.  Naltrexone had been started to assist with underlying hypersexual behaviors and impulsivity and to treat her underlying heroin addiction since she had not had any methadone or heroin since admission.  She has also been placed on Klonopin and clonidine to aid with her severe anxiety.   Subjective: Patient awake and calm.  Has no recollection of recent behavioral issues.  When told that she had become aggressive and violent in context of delusions including hitting and strangling nursing staff she states "I did not do that".  She wants to be discharged home.  Explained to patient that with her current psychiatric illness and rapid fluctuations and mood and behavior that she is not ready to go  home.  She is aware we are working on adjusting her psychiatric medications and additional aggressive treatments that cannot be offered at this hospital anticipated once she transitions to behavioral health Hospital.  She reports she has been up since 5 AM and has already completed her yoga for the day. Explained to her that her difficulty with sleeping at night/insomnia is likely related to frequent sedation during the day for abnormal behaviors.   Objective: Vitals:   05/10/20 0514 05/10/20 2134  BP: (!) 107/94 111/75  Pulse: 73 87  Resp: 17 18  Temp: 98.1 F (36.7 C)   SpO2: 100% 97%    Intake/Output Summary (Last 24 hours) at 05/11/2020 0814 Last data filed at 05/11/2020 0500 Gross per 24 hour  Intake 1200 ml  Output -  Net 1200 ml   Filed Weights   04/04/20 [id number] 04/05/20 0613 05/08/20 0500  Weight: 70.9 kg 71.6 kg 74.8 kg    Exam: General: Awake, flat affect, no acute distress Pulmonary:  Posterior lung sounds clear to auscultation, stable on room air Cardiac:  Normal heart sounds, no tachycardia, skin warm and dry with adequate capillary refill Abdomen: Nontender nondistended.  Tolerating oral diet.  LBM 1/16 Neurological: Moves all extremities x4, no focal neurological deficits, no tremors or rigidity/EPS sx's Psychiatric: Awake, flat affect.  Oriented x3.  Does not remember behaviors over the past several days when she was acting aggressively and violently.  Seems surprised to hear that she was having delusions.     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 During periods of alertness and lucidity patient denied use of methamphetamines prior to admission although did admit to abruptly stopping her methadone -Continue Geodon 80 mg BID (dose last increased on 1/3).  -Continue naltrexone 25 mg daily  -Continue Klonopin down to 1 mg every 6 hours -Continue 1:1 safety sitter and restraints as needed for aggressive behavior -In regards to her bizarre behavior she was exhibiting similar symptoms on date of admission and 24 hours previous to admission -Continue Remeron 30 mg HS  -Appreciate assistance of psych team. It appears we have maxed out on all psychotropic medications for this patient.  It is noted that many of these medications will take weeks to months for full therapeutic benefit.  Given her persistent severe behavioral swings that include violent episodes of physically attacking staff she is not a candidate for  an SNF and needs more aggressive therapies.  Psychiatry has recommended long-term placement at [facility].  [facility] aware and is beginning this process.  -Patient does not have chronic seizure disorder but did have status epilepticus in September 2021 likely 2/2 acute methadone withdrawal.  -Behavior continues to very from calm cooperative and oriented to sad and delusional to aggressive/violent and delusional involving attacking staff.  Continue to await acceptance to high-level psychiatric unit that can also provide ECT  Severe insomnia -Continue Remeron and trazodone-we will increase trazodone dosage to 150 mg -Can supplement with Vistaril would need to do so with caution given drug drug interaction that can cause movement disorder -Suspect recurrent insomnia related to patient sleeping more during the day after requiring sedation for aggressive and violent behaviors.  Status epilepticus -See above regarding suspected methamphetamine use prior to admission.  UDS was  negative but patient did admit to abruptly stopping methadone preceding hospitalization and this may be reason for onset of seizures i.e. withdrawal seizures -Repeat MRI on 12/28 within normal limits and it is felt behavioral issues related to brain injury sequelae and chronic psychiatric condition.  Neurology has signed off -Vimpat, Dilantin and phenobarbital have been tapered and discontinued at recommendation of neurologist  History of polysubstance abuse with heroin/history of outpatient methadone treatment -Prescribed methadone 60 mg daily from a clinic in [place] prior to admission -Has not required methadone since arrival therefore we will continue naltrexone to treat heroin addiction in addition to impulsivity and hypersexual behaviors.  This is the last drug clinicians utilize to maintain stability after patients have been successful with methadone.  Would not return to methadone at this juncture if avoidable.    Depression and severe anxiety disorder/PTSD/no bipolar disorder now with schizoaffective features -She continues to demonstrate extremes in behavior: she was either calm and mostly appropriate but mildly confused vs extremely withdrawn sad and crying vs extremely agitated and violent.  Because of this she remains under IVC (last updated 1/19) -Extremes in behavior precipitated by rumination over situation regarding her children which she gave up for adoption and are unable to physically see while hospitalized -Continue Klonopin as above-further tapering at this time -Continue Tegretol 600 mg TID (max dose)-recent LFTs stable -She was reevaluated by psychiatry who recommended can transition from Tegretol to Depakote if LFTs increase in the context of her underlying hepatitis C -Scheduled Vistaril discontinued due to drug to drug interaction with Geodon -On 1/6 Lexapro discontinued in favor of Celexa by psychiatry team -On 1/12 patient did confirm to me that she has previously been  diagnosed with bipolar disorder -See above regarding behavioral manifestations of concomitant brain injury and bipolar disorder with schizoaffective features -Continue IM Geodon every 8-12 hours as needed severe agitation  Recurrent constipation -Continue Colace 100 mg twice daily prn -Continue MiraLAX to twice daily     Other problems: Nonobstructive transaminitis in context of hepatitis C -Elevated HCV antibody with markedly elevated HCV RNA quantitative level  consistent with chronic hepatitis C  -LFTs remain stable without an obstructive pattern noted -11/10: Discussed with ID Dr. Manson Passey. Treatment of hepatitis C typically is initiated in the outpatient setting. Medications can be quite expensive and usually take time to obtain insurance approval.  -ID recommends scheduling outpatient appointment their clinic closer to discharge date  Back pain 2/2 abnormal urinalysis consistent with UTI -Patient with low-grade fever overnight and is now having back pain for days not responsive to NSAIDs and muscle relaxers -DG lumbar spine unremarkable -During  hospitalization patient has been treated for pansensitive E. coli UTI as well as Salmonella UTI both of which have been sensitive to Levaquin -Completed Levaquin 12/20-urine culture obtained after antibiotics initiated and showed no growth -CT abdomen and pelvis on 12/17 unremarkable  Salmonella UTI -Completed amoxicillin  Abnormal urinalysis -Not consistent with UTI, was positive for moderate hemoglobin and greater than 50 RBCs with an elevated specific gravity -Suspect dehydration -Patient denies back pain or history of renal calculi although given her confusion unclear if this history is accurate  Physical deconditioning -Resolved -Does continue to exhibit impulsivity but maintains appropriate balance and gait no longer requires one-to-one safety sitter  Large hemorrhoids. -Resolved -Markedly improved-LD Proctofoam  12/15  Dysphagia -Resolved -Continue regular diet   Data Reviewed: Basic Metabolic Panel: No results for input(s): NA, K, CL, CO2, GLUCOSE, BUN, CREATININE, CALCIUM, MG, PHOS in the last 168 hours. Liver Function Tests: No results for input(s): AST, ALT, ALKPHOS, BILITOT, PROT, ALBUMIN in the last 168 hours. No results for input(s): LIPASE, AMYLASE in the last 168 hours. No results for input(s): AMMONIA in the last 168 hours. CBC: No results for input(s): WBC, NEUTROABS, HGB, HCT, MCV, PLT in the last 168 hours. Cardiac Enzymes: No results for input(s): CKTOTAL, CKMB, CKMBINDEX, TROPONINI in the last 168 hours. BNP (last 3 results) No results for input(s): BNP in the last 8760 hours.  ProBNP (last 3 results) No results for input(s): PROBNP in the last 8760 hours.  CBG: No results for input(s): GLUCAP in the last 168 hours.  No results found for this or any previous visit (from the past 240 hour(s)).   Studies: No results found.  Scheduled Meds: . amoxicillin-clavulanate  1 tablet Oral Q8H  . carbamazepine  600 mg Oral TID  . Chlorhexidine Gluconate Cloth  6 each Topical Daily  . citalopram  20 mg Oral Daily  . clonazePAM  1 mg Oral Q6H  . cloNIDine  0.1 mg Transdermal Weekly  . diclofenac Sodium  4 g Topical QID  . docusate sodium  100 mg Oral BID  . feeding supplement  237 mL Oral BID BM  . melatonin  10 mg Oral QHS  . metroNIDAZOLE  500 mg Oral Q8H  . mirtazapine  30 mg Oral QHS  . multivitamin with minerals  1 tablet Oral Daily  . naltrexone  25 mg Oral Daily  . polyethylene glycol  17 g Oral BID  . vitamin B-6  100 mg Oral Daily  . thiamine  100 mg Oral Daily  . traZODone  150 mg Oral QHS  . ziprasidone  80 mg Oral 2 times per day   Continuous Infusions:   Principal Problem:   Delirium due to multiple etiologies Active Problems:   Acute metabolic encephalopathy   Hypokalemia   Polysubstance abuse (HCC)   Elevated CK   Transaminitis   Refractory  seizure (HCC)   Chronic hepatitis C without hepatic coma (HCC)   Distended abdomen   Palliative care encounter   Colonic Ileus (HCC)   Organic brain syndrome   On enteral nutrition   Physical deconditioning   Protein-calorie malnutrition (HCC)   Inadequate oral nutritional intake   Impulse disorder   Consultants:  Neurology  Psychiatry  Interventional radiology  Surgery    Procedures:  9/14 lumbar puncture  9/15 EEG  9/16 EEG  9/17 EEG  9/19 overnight EEG with video  9/22 overnight EEG with video with discontinuation of long-term EEG monitoring on 9/25  9/24 core track placement  10/6 EEG  12/15 PEG tube removed    Antibiotics: Anti-infectives (From admission, onward)   Start     Dose/Rate Route Frequency Ordered Stop   05/11/20 0845  amoxicillin-clavulanate (AUGMENTIN) 500-125 MG per tablet 500 mg        1 tablet Oral Every 8 hours 05/11/20 0750 05/18/20 0559   05/11/20 0845  metroNIDAZOLE (FLAGYL) tablet 500 mg        500 mg Oral Every 8 hours 05/11/20 0750 05/18/20 0559   04/05/20 1415  levofloxacin (LEVAQUIN) tablet 500 mg        500 mg Oral Daily 04/05/20 1327 04/11/20 0911   03/19/20 1000  metroNIDAZOLE (FLAGYL) tablet 500 mg  Status:  Discontinued        500 mg Per Tube 2 times daily 03/19/20 0822 03/23/20 0827   03/18/20 1200  amoxicillin (AMOXIL) 250 MG/5ML suspension 500 mg  Status:  Discontinued        500 mg Per Tube Every 8 hours 03/18/20 0915 03/23/20 0559   03/17/20 1200  cefTRIAXone (ROCEPHIN) 1 g in sodium chloride 0.9 % 100 mL IVPB  Status:  Discontinued        1 g 200 mL/hr over 30 Minutes Intravenous Every 24 hours 03/17/20 1048 03/18/20 0913   03/16/20 1130  metroNIDAZOLE (FLAGYL) 50 mg/ml oral suspension 500 mg  Status:  Discontinued        500 mg Per Tube 2 times daily 03/16/20 1040 03/19/20 0821   03/16/20 1130  fluconazole (DIFLUCAN) 40 MG/ML suspension 152 mg        150 mg Per Tube  Once 03/16/20 1040 03/16/20 1245    02/11/20 1526  ceFAZolin (ANCEF) IVPB 2g/100 mL premix        2 g 200 mL/hr over 30 Minutes Intravenous  Once 02/11/20 1526 02/11/20 1641   02/05/20 0600  ceFAZolin (ANCEF) IVPB 2g/100 mL premix        2 g 200 mL/hr over 30 Minutes Intravenous To Short Stay 02/04/20 1054 02/05/20 0950   01/29/20 0600  ceFAZolin (ANCEF) IVPB 2g/100 mL premix        2 g 200 mL/hr over 30 Minutes Intravenous On call to O.R. 01/28/20 1511 01/30/20 0559   01/28/20 2000  cefTRIAXone (ROCEPHIN) 1 g in sodium chloride 0.9 % 100 mL IVPB        1 g 200 mL/hr over 30 Minutes Intravenous Every 24 hours 01/28/20 1925 02/02/20 0724   01/06/20 0900  cefTRIAXone (ROCEPHIN) 2 g in sodium chloride 0.9 % 100 mL IVPB  Status:  Discontinued        2 g 200 mL/hr over 30 Minutes Intravenous Every 12 hours 01/06/20 0105 01/06/20 2202   01/06/20 0800  vancomycin (VANCOCIN) IVPB 1000 mg/200 mL premix  Status:  Discontinued       "Followed by" Linked Group Details   1,000 mg 200 mL/hr over 60 Minutes Intravenous Every 12 hours 01/05/20 1904 01/06/20 2202   01/05/20 2000  vancomycin (VANCOREADY) IVPB 1500 mg/300 mL       "Followed by" Linked Group Details   1,500 mg 150 mL/hr over 120 Minutes Intravenous  Once 01/05/20 1904 01/06/20 0127   01/05/20 1830  cefTRIAXone (ROCEPHIN) 2 g in sodium chloride 0.9 % 100 mL IVPB        2 g 200 mL/hr over 30 Minutes Intravenous  Once 01/05/20 1829 01/05/20 2220       Time spent: 25 minutes    Junious SilkAllison Ellis ANP  [facility] 7 am - 330 pm/M-F for direct patient care and secure chat Please see Amion for contact information 126 days

## 2020-05-12 NOTE — Progress Notes (Signed)
[facility] PROGRESS NOTE  Anita Davis UYQ:[id number] DOB: 10-18-1985 DOA: 01/04/2020 PCP: [name], PA-C    02/10/20                      02/15/20                       04/08/20   Status: Remains inpatient appropriate because:Altered mental status, Unsafe d/c plan and Inpatient level of care appropriate due to severity of illness   Dispo: The patient is from: Home              Anticipated d/c is to: SNF              Anticipated d/c date is: > 3 days              Patient currently is medically stable to d/c.  Barriers to discharge; continued high severity psychiatric illness with waxing and waning mentation with violent outburst requiring IVC and anticipated discharge long-term to state psychiatric facility. We are also pursuing [facility] at family request  Patient remains a priority admission at [facility] and [facility].  Her extremes in behavior and orientation/mentation along with violent outbursts including physically attacking staff warrants urgent transfer to inpatient psychiatric unit.    Code Status: DNR Family Communication: Stepmother Anita Davis 1/17 -aware that behavior has not improved and definitely needs aggressive IP psych DVT prophylaxis: Consistently ambulatory so no DVT prophylaxis indicated Vaccination status: Fully vaccinated against Covid with second vaccine given on 02/23/2020  Pertinent social history Patient has 3 children: Anita Davis and Anita Davis's father has primary custody and no one in the family knows where they reside With permission from patient's father and stepmother I contacted Anita Davis (telephone number [phone]) who has legal custody of Anita Davis and Anita Davis; at this juncture Mrs. Anita Davis states that after extensive discussions with Anita Davis he no longer wishes to visit his mother in person.  Anita Davis is only 7 and it is not appropriate for him to visit his mother at this point.  Prior to the patient's acute illness Mrs. Anita Davis states that  Anita Davis had regular frequent supervised visits with these children.  The possibility of children recording video greetings to their mother has been discussed and she will need to discuss this with her husband before agreeing. Mrs. 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  She presented to [facility] ER 3 times prior to admission.  On her 4th presentation she was admitted.  On 9/2 she was diagnosed with acute cystitis with hematuria and was discharged home on Keflex and Zofran.  9/9 she presented with anxiety and symptoms consistent with a panic attack.  She was discharged from the ER with prescription for Vistaril.  She again presented to the ER on 9/10  reporting significant anxiety and severe insomnia and stating the Vistaril was not working.  She was given Ativan while in the ER and this appeared to improve her symptoms.  She was stable for discharge and  instructed to take Benadryl at bedtime to assist with her insomnia.    On 9/13 patient brought to the ER by family.  She was refusing to answer questions.  Clinician documented sores/lesions on her legs consistent with methamphetamine use.  When questioned regarding this patient reportedly just stared at the clinician.  There were some concerns that patient may have been abusing methamphetamine based on these clinical findings but was never confirmed.  Of note UDS was negative for methamphetamines. Prior to discharge from the ER patient appeared to be  hallucinating but was able to verbally communicate.  Over the next hour she became more agitated and was given Ativan 2 mg orally. She later returned to her baseline mentation stating she did not wish to hurt herself or anybody else and requested to be discharged home. She was referred to [facility] and discharged by EDP.  Later that same day patient was returned to the ER by the police.  Patient was found laying down in a parking lot and acting bizarre.  She apparently told the police that she had put "meth" in her pants.  Patient kept reaching for vagina and it was suspected she may have placed drugs intravaginally.  No drugs were ever found.  EEG and MRI were completed with no acute abnormalities; urine drug screen positive for THC as well as BZDs (which had been given in the ED).  She underwent a lumbar puncture which was negative and was empirically started on antibiotics to treat potential infectious etiology to her encephalopathy.  After admission this patient developed seizure activity which progressed to status epilepticus therefore she was transferred to [facility] for further neurological evaluation.  After several days recurrent seizures were aborted with combination of 3 antiepileptic drugs.  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  Unfortunately she did not improve.  Because of persistent altered mentation and inability to sustain appropriate oral intake a PEG tube was placed.  This tube was eventually discontinued after patient became alert and awake enough to eat consistently during the latter portion of the hospitalization.  Of note patient has had episodes of increasing mental alertness and clarity and as of 1/12 patient confirmed that she had abruptly stopped taking her prescribed methadone prior to presentations to the ER and this likely explains some of her abnormal behaviors (methadone  withdrawal in conjunction with bipolar disorder) and her status epilepticus (methadone withdrawal)  Since admission patient has had varying levels of awakeness and orientation as well as labile mood and affect.  Essentially for the first 8 weeks of hospitalization patient would have brief episodes of being awake, sometimes oriented sometimes not.  These episodes would last for several minutes to several hours but were never sustained.  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  Multiple psychotropic medications were tried without success or caused significant side effects and were discontinued.  Decision made to taper and wean all psychotropic drugs.  After this was done patient became extremely alert and agitated.  She was very impulsive and was demonstrating hypersexual behavior as well as extreme mood swings.  Seroquel tried initially to manage brain injury sequela but required high dosages unfortunately contributing to patient's depression and suicidal ideation.  It was noted that each time patient was given IM Geodon that she became alert and for the most part oriented, was able to consistently communicate and perform ADLs.  Eventually she was transitioned to oral Geodon.  Naltrexone had been started to assist with underlying hypersexual behaviors and impulsivity and to treat her underlying heroin addiction since she had not had any methadone or heroin since admission.  She has also been placed on Klonopin and clonidine to aid with her severe anxiety.   Subjective: Awake.  Flat depressed affect.  Minimally conversant when engaged.  Initially did not respond to questions asked.  I did ask her if she had slept well last night and whether she had completed her yogurt this morning and she stated yes.   Objective: Vitals:   05/11/20 2014 05/12/20 0528  BP: 117/74 106/72  Pulse: 65 60  Resp: 20 18  Temp: 98.2 F (36.8 C) 98 F (36.7 C)  SpO2: 99% 100%     Intake/Output Summary (Last 24 hours) at 05/12/2020 0815 Last data filed at 05/12/2020 [id number] Gross per 24 hour  Intake 600 ml  Output -  Net 600 ml   Filed Weights   04/04/20 [id number] 04/05/20 0613 05/08/20 0500  Weight: 70.9 kg 71.6 kg 74.8 kg    Exam: General: Awake, flat affect but calm, no acute distress Pulmonary:  Bilateral lung sounds clear to auscultation anteriorly, stable on room air Cardiac:  Normal heart sounds, no tachycardia, extremities warm to touch Abdomen: Nontender with normoactive bowel sounds.  Tolerating diet. LBM 1/19 Neurological: Moves all extremities x4, no focal neurological deficits, no tremors or rigidity/EPS sx's Psychiatric: Awake, flat affect.  Oriented x3.  Does not remember behaviors over the past several days when she was acting aggressively and violently.  Seems surprised to hear that she was having delusions.     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 During periods of alertness and lucidity patient denied use of methamphetamines prior to admission although did admit to abruptly stopping her methadone -Continue Geodon 80 mg BID (dose last increased on 1/3).  -Continue naltrexone 25 mg daily  -Continue Klonopin down to 1 mg every 6 hours -Continue 1:1 safety sitter and restraints as needed for aggressive behavior -In regards to her bizarre behavior she was exhibiting similar symptoms on date of admission and 24 hours previous to admission -Continue Remeron 30 mg HS   -Appreciate assistance of psych team. It appears we have maxed out on all psychotropic medications for this patient.  It is noted that many of these medications will take weeks to months for full therapeutic benefit.  Given her persistent severe behavioral swings that include violent episodes of physically attacking staff she is not a candidate for an SNF and needs more aggressive therapies.  Psychiatry has [facility]  [facility] and [facility].  -Patient does not have chronic seizure disorder but did have status epilepticus in September 2021 likely 2/2 acute methadone withdrawal.  -Behavior continues to very from calm cooperative and oriented to sad and delusional to aggressive/violent and delusional involving attacking staff.  Continue to await acceptance to high-level psychiatric unit that can also provide ECT  Severe insomnia -Continue Remeron and trazodone-we will increase trazodone dosage to 150 mg -Can supplement with  Vistaril would need to do so with caution given drug drug interaction that can cause movement disorder -Suspect recurrent insomnia related to patient sleeping more during the day after requiring sedation for aggressive and violent behaviors. -1/20: Increased dose of trazodone appears to be working with staff reporting patient slept through the night.  Status epilepticus -See above regarding suspected methamphetamine use prior to admission.  UDS was negative but patient did admit to abruptly stopping methadone preceding hospitalization and this may be reason for onset of seizures i.e. withdrawal seizures -Repeat MRI on 12/28 within normal limits and it is felt behavioral issues related to brain injury sequelae and chronic psychiatric condition.  Neurology has signed off -Vimpat, Dilantin and phenobarbital have been tapered and discontinued at recommendation of neurologist  History of polysubstance abuse with  heroin/history of outpatient methadone treatment -[place] methadone 60 mg [place] to [place] -Has not required methadone since arrival therefore we will continue naltrexone to treat heroin addiction in addition to impulsivity and hypersexual behaviors.  This is the last drug clinicians utilize to maintain stability after patients have been successful with methadone.  Would not return to methadone at this juncture if avoidable.    Depression and severe anxiety disorder/PTSD/no bipolar disorder now with schizoaffective features -She continues to demonstrate extremes in behavior: she was either calm and mostly appropriate but mildly confused vs extremely withdrawn sad and crying vs extremely agitated and violent.  Because of this she remains under IVC (last updated 1/19) -Extremes in behavior precipitated by rumination over situation regarding her children which she gave up for adoption and are unable to physically see while hospitalized -Continue Klonopin as above-further tapering at this time -Continue Tegretol 600 mg TID (max dose)-recent LFTs stable -She was reevaluated by psychiatry who recommended can transition from Tegretol to Depakote if LFTs increase in the context of her underlying hepatitis C -Scheduled Vistaril discontinued due to drug to drug interaction with Geodon -On 1/6 Lexapro discontinued in favor of Celexa by psychiatry team -On 1/12 patient did confirm to me that she has previously been diagnosed with bipolar disorder -See above regarding behavioral manifestations of concomitant brain injury and bipolar disorder with schizoaffective features -Continue IM Geodon every 8-12 hours as needed severe agitation  Recurrent constipation -Continue Colace 100 mg twice daily prn -Continue MiraLAX to twice daily     Other problems: Nonobstructive transaminitis in context of hepatitis C -Elevated HCV antibody with markedly elevated HCV RNA  quantitative level  consistent with chronic hepatitis C  -LFTs remain stable without an obstructive pattern noted -11/10: Discussed with ID Dr. Manson PasseyMandahar. Treatment of hepatitis C typically is initiated in the outpatient setting. Medications can be quite expensive and usually take time to obtain insurance approval.  -ID recommends scheduling outpatient appointment their clinic closer to discharge date  Back pain 2/2 abnormal urinalysis consistent with UTI -Patient with low-grade fever overnight and is now having back pain for days not responsive to NSAIDs and muscle relaxers -DG lumbar spine unremarkable -During hospitalization patient has been treated for pansensitive E. coli UTI as well as Salmonella UTI both of which have been sensitive to Levaquin -Completed Levaquin 12/20-urine culture obtained after antibiotics initiated and showed no growth -CT abdomen and pelvis on 12/17 unremarkable  Salmonella UTI -Completed amoxicillin  Abnormal urinalysis -Not consistent with UTI, was positive for moderate hemoglobin and greater than 50 RBCs with an elevated specific gravity -Suspect dehydration -Patient denies back pain or history of renal calculi although  given her confusion unclear if this history is accurate  Physical deconditioning -Resolved -Does continue to exhibit impulsivity but maintains appropriate balance and gait no longer requires one-to-one safety sitter  Large hemorrhoids. -Resolved -Markedly improved-LD Proctofoam 12/15  Dysphagia -Resolved -Continue regular diet   Data Reviewed: Basic Metabolic Panel: No results for input(s): NA, K, CL, CO2, GLUCOSE, BUN, CREATININE, CALCIUM, MG, PHOS in the last 168 hours. Liver Function Tests: No results for input(s): AST, ALT, ALKPHOS, BILITOT, PROT, ALBUMIN in the last 168 hours. No results for input(s): LIPASE, AMYLASE in the last 168 hours. No results for input(s): AMMONIA in the last 168 hours. CBC: No results for  input(s): WBC, NEUTROABS, HGB, HCT, MCV, PLT in the last 168 hours. Cardiac Enzymes: No results for input(s): CKTOTAL, CKMB, CKMBINDEX, TROPONINI in the last 168 hours. BNP (last 3 results) No results for input(s): BNP in the last 8760 hours.  ProBNP (last 3 results) No results for input(s): PROBNP in the last 8760 hours.  CBG: No results for input(s): GLUCAP in the last 168 hours.  No results found for this or any previous visit (from the past 240 hour(s)).   Studies: No results found.  Scheduled Meds: . amoxicillin-clavulanate  1 tablet Oral Q8H  . carbamazepine  600 mg Oral TID  . Chlorhexidine Gluconate Cloth  6 each Topical Daily  . citalopram  20 mg Oral Daily  . clonazePAM  1 mg Oral Q6H  . cloNIDine  0.1 mg Transdermal Weekly  . diclofenac Sodium  4 g Topical QID  . docusate sodium  100 mg Oral BID  . feeding supplement  237 mL Oral BID BM  . melatonin  10 mg Oral QHS  . metroNIDAZOLE  500 mg Oral Q8H  . mirtazapine  30 mg Oral QHS  . multivitamin with minerals  1 tablet Oral Daily  . naltrexone  25 mg Oral Daily  . polyethylene glycol  17 g Oral BID  . vitamin B-6  100 mg Oral Daily  . thiamine  100 mg Oral Daily  . traZODone  150 mg Oral QHS  . ziprasidone  80 mg Oral 2 times per day   Continuous Infusions:   Principal Problem:   Delirium due to multiple etiologies Active Problems:   Acute metabolic encephalopathy   Hypokalemia   Polysubstance abuse (HCC)   Elevated CK   Transaminitis   Refractory seizure (HCC)   Chronic hepatitis C without hepatic coma (HCC)   Distended abdomen   Palliative care encounter   Colonic Ileus (HCC)   Organic brain syndrome   On enteral nutrition   Physical deconditioning   Protein-calorie malnutrition (HCC)   Inadequate oral nutritional intake   Impulse disorder   Consultants:  Neurology  Psychiatry  Interventional radiology  Surgery    Procedures:  9/14 lumbar puncture  9/15 EEG  9/16 EEG  9/17  EEG  9/19 overnight EEG with video  9/22 overnight EEG with video with discontinuation of long-term EEG monitoring on 9/25  9/24 core track placement  10/6 EEG  12/15 PEG tube removed    Antibiotics: Anti-infectives (From admission, onward)   Start     Dose/Rate Route Frequency Ordered Stop   05/11/20 0845  amoxicillin-clavulanate (AUGMENTIN) 500-125 MG per tablet 500 mg        1 tablet Oral Every 8 hours 05/11/20 0750 05/18/20 0559   05/11/20 0845  metroNIDAZOLE (FLAGYL) tablet 500 mg        500 mg Oral Every 8 hours  05/11/20 0750 05/18/20 0559   04/05/20 1415  levofloxacin (LEVAQUIN) tablet 500 mg        500 mg Oral Daily 04/05/20 1327 04/11/20 0911   03/19/20 1000  metroNIDAZOLE (FLAGYL) tablet 500 mg  Status:  Discontinued        500 mg Per Tube 2 times daily 03/19/20 0822 03/23/20 0827   03/18/20 1200  amoxicillin (AMOXIL) 250 MG/5ML suspension 500 mg  Status:  Discontinued        500 mg Per Tube Every 8 hours 03/18/20 0915 03/23/20 0559   03/17/20 1200  cefTRIAXone (ROCEPHIN) 1 g in sodium chloride 0.9 % 100 mL IVPB  Status:  Discontinued        1 g 200 mL/hr over 30 Minutes Intravenous Every 24 hours 03/17/20 1048 03/18/20 0913   03/16/20 1130  metroNIDAZOLE (FLAGYL) 50 mg/ml oral suspension 500 mg  Status:  Discontinued        500 mg Per Tube 2 times daily 03/16/20 1040 03/19/20 0821   03/16/20 1130  fluconazole (DIFLUCAN) 40 MG/ML suspension 152 mg        150 mg Per Tube  Once 03/16/20 1040 03/16/20 1245   02/11/20 1526  ceFAZolin (ANCEF) IVPB 2g/100 mL premix        2 g 200 mL/hr over 30 Minutes Intravenous  Once 02/11/20 1526 02/11/20 1641   02/05/20 0600  ceFAZolin (ANCEF) IVPB 2g/100 mL premix        2 g 200 mL/hr over 30 Minutes Intravenous To Short Stay 02/04/20 1054 02/05/20 0950   01/29/20 0600  ceFAZolin (ANCEF) IVPB 2g/100 mL premix        2 g 200 mL/hr over 30 Minutes Intravenous On call to O.R. 01/28/20 1511 01/30/20 0559   01/28/20 2000  cefTRIAXone  (ROCEPHIN) 1 g in sodium chloride 0.9 % 100 mL IVPB        1 g 200 mL/hr over 30 Minutes Intravenous Every 24 hours 01/28/20 1925 02/02/20 0724   01/06/20 0900  cefTRIAXone (ROCEPHIN) 2 g in sodium chloride 0.9 % 100 mL IVPB  Status:  Discontinued        2 g 200 mL/hr over 30 Minutes Intravenous Every 12 hours 01/06/20 0105 01/06/20 2202   01/06/20 0800  vancomycin (VANCOCIN) IVPB 1000 mg/200 mL premix  Status:  Discontinued       "Followed by" Linked Group Details   1,000 mg 200 mL/hr over 60 Minutes Intravenous Every 12 hours 01/05/20 1904 01/06/20 2202   01/05/20 2000  vancomycin (VANCOREADY) IVPB 1500 mg/300 mL       "Followed by" Linked Group Details   1,500 mg 150 mL/hr over 120 Minutes Intravenous  Once 01/05/20 1904 01/06/20 0127   01/05/20 1830  cefTRIAXone (ROCEPHIN) 2 g in sodium chloride 0.9 % 100 mL IVPB        2 g 200 mL/hr over 30 Minutes Intravenous  Once 01/05/20 1829 01/05/20 2220       Time spent: 25 minutes    [name] ANP  [facility] 7 am - 330 pm/M-F for direct patient care and secure chat Please see Amion for contact information 127 days

## 2020-05-12 NOTE — Progress Notes (Signed)
CSW spoke with [facility] staff who stated there are no beds available today.  Brion Hedges, MSW, LCSW-A Transitions of Care  Clinical Social Worker I [phone]  

## 2020-05-13 NOTE — Progress Notes (Signed)
CSW spoke with [facility] staff who stated there are no beds available today.  Edwin Dada, MSW, LCSW-A Transitions of Care  Clinical Social Worker I [phone]

## 2020-05-13 NOTE — Progress Notes (Signed)
[facility] PROGRESS NOTE  Maysun Meditz ZOX:[id number] DOB: 09/14/1985 DOA: 01/04/2020 PCP: [name], PA-C    02/10/20                      02/15/20                       04/08/20   Status: Remains inpatient appropriate because:Altered mental status, Unsafe d/c plan and Inpatient level of care appropriate due to severity of illness   Dispo: The patient is from: Home              Anticipated d/c is to: SNF              Anticipated d/c date is: > 3 days              Patient currently is medically stable to d/c.  Barriers to discharge; continued high severity psychiatric illness with waxing and waning mentation with violent outburst requiring IVC and anticipated discharge long-term to state psychiatric facility. We are also pursuing [facility] at family request  Patient remains a priority admission at [facility] and [facility].  Her extremes in behavior and orientation/mentation along with violent outbursts including physically attacking staff warrants urgent transfer to inpatient psychiatric unit.    Code Status: DNR Family Communication: Stepmother [name] 1/17 -aware that behavior has not improved and definitely needs aggressive IP psych DVT prophylaxis: Consistently ambulatory so no DVT prophylaxis indicated Vaccination status: Fully vaccinated against Covid with second vaccine given on 02/23/2020  Pertinent social history Patient has 3 children: [name] and [name]'s father has primary custody and no one in the family knows where they reside With permission from patient's father and stepmother I contacted [name] (telephone number [phone]) who has legal custody of [name] and [name]; at this juncture [name] states that after extensive discussions with [name] he no longer wishes to visit his mother in person.  [name] is only 7 and it is not appropriate for him to visit his mother at this point.  Prior to the patient's acute illness [name] states that  Tacoya had regular frequent supervised visits with these children.  The possibility of children recording video greetings to their mother has been discussed and she will need to discuss this with her husband before agreeing. 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  She presented to [facility] ER 3 times prior to admission.  On her 4th presentation she was admitted.  On 9/2 she was diagnosed with acute cystitis with hematuria and was discharged home on Keflex and Zofran.  9/9 she presented with anxiety and symptoms consistent with a panic attack.  She was discharged from the ER with prescription for Vistaril.  She again presented to the ER on 9/10  reporting significant anxiety and severe insomnia and stating the Vistaril was not working.  She was given Ativan while in the ER and this appeared to improve her symptoms.  She was stable for discharge and  instructed to take Benadryl at bedtime to assist with her insomnia.    On 9/13 patient brought to the ER by family.  She was refusing to answer questions.  Clinician documented sores/lesions on her legs consistent with methamphetamine use.  When questioned regarding this patient reportedly just stared at the clinician.  There were some concerns that patient may have been abusing methamphetamine based on these clinical findings but was never confirmed.  Of note UDS was negative for methamphetamines. Prior to discharge from the ER patient appeared to be  hallucinating but was able to verbally communicate.  Over the next hour she became more agitated and was given Ativan 2 mg orally. She later returned to her baseline mentation stating she did not wish to hurt herself or anybody else and requested to be discharged home. She was referred to [facility] and discharged by EDP.  Later that same day patient was returned to the ER by the police.  Patient was found laying down in a parking lot and acting bizarre.  She apparently told the police that she had put "meth" in her pants.  Patient kept reaching for vagina and it was suspected she may have placed drugs intravaginally.  No drugs were ever found.  EEG and MRI were completed with no acute abnormalities; urine drug screen positive for THC as well as BZDs (which had been given in the ED).  She underwent a lumbar puncture which was negative and was empirically started on antibiotics to treat potential infectious etiology to her encephalopathy.  After admission this patient developed seizure activity which progressed to status epilepticus therefore she was transferred to [facility] for further neurological evaluation.  After several days recurrent seizures were aborted with combination of 3 antiepileptic drugs.  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  Unfortunately she did not improve.  Because of persistent altered mentation and inability to sustain appropriate oral intake a PEG tube was placed.  This tube was eventually discontinued after patient became alert and awake enough to eat consistently during the latter portion of the hospitalization.  Of note patient has had episodes of increasing mental alertness and clarity and as of 1/12 patient confirmed that she had abruptly stopped taking her prescribed methadone prior to presentations to the ER and this likely explains some of her abnormal behaviors (methadone  withdrawal in conjunction with bipolar disorder) and her status epilepticus (methadone withdrawal)  Since admission patient has had varying levels of awakeness and orientation as well as labile mood and affect.  Essentially for the first 8 weeks of hospitalization patient would have brief episodes of being awake, sometimes oriented sometimes not.  These episodes would last for several minutes to several hours but were never sustained.  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  Multiple psychotropic medications were tried without success or caused significant side effects and were discontinued.  Decision made to taper and wean all psychotropic drugs.  After this was done patient became extremely alert and agitated.  She was very impulsive and was demonstrating hypersexual behavior as well as extreme mood swings.  Seroquel tried initially to manage brain injury sequela but required high dosages unfortunately contributing to patient's depression and suicidal ideation.  It was noted that each time patient was given IM Geodon that she became alert and for the most part oriented, was able to consistently communicate and perform ADLs.  Eventually she was transitioned to oral Geodon.  Naltrexone had been started to assist with underlying hypersexual behaviors and impulsivity and to treat her underlying heroin addiction since she had not had any methadone or heroin since admission.  She has also been placed on Klonopin and clonidine to aid with her severe anxiety.   Subjective: Sleeping.  Easily awakened.  Calm when awakened.  Able to answer orientation questions appropriately.  Asked her if she felt like her sleeping medication was making her sleepy during the day and she stated "no".  More interactive today and not as defiant or distancing   Objective: Vitals:   05/12/20 2104 05/13/20 0530  BP: 108/79 95/65  Pulse: 63 60  Resp: 18 16  Temp: 98.6 F (37 C) 98 F  (36.7 C)  SpO2: 99% 98%    Intake/Output Summary (Last 24 hours) at 05/13/2020 0759 Last data filed at 05/13/2020 0300 Gross per 24 hour  Intake 1420 ml  Output --  Net 1420 ml   Filed Weights   04/04/20 [id number] 04/05/20 0613 05/08/20 0500  Weight: 70.9 kg 71.6 kg 74.8 kg    Exam: General: Sleeping but easily awakened, calm in no acute distress Pulmonary:  Anterior lung sounds clear, stable on room air Cardiac:  Normal heart sounds, no tachycardia, extremities warm to touch with adequate capillary refill Abdomen: Soft nontender nondistended with normoactive bowel sounds.  Eating 75 to 100% of meals noting this is also influenced by patient's mentation and psychiatric status which is variable from day to day.  LBM 1/19 Neurological: Moves all extremities x4, no focal neurological deficits, no tremors or rigidity/EPS sx's Psychiatric: Awake, flat affect.  Oriented x3.  Does not remember behaviors over the past several days when she was acting aggressively and violently.  Seems surprised to hear that she was having delusions.     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 During periods of alertness and lucidity patient denied use of methamphetamines prior to admission although did admit to abruptly stopping her methadone -Continue Geodon 80 mg BID (dose last increased on 1/3).  -Continue naltrexone 25 mg daily  -Continue Klonopin down to 1 mg every 6 hours -Continue 1:1 safety sitter and restraints as needed for  aggressive behavior -In regards to her bizarre behavior she was exhibiting similar symptoms on date of admission and 24 hours previous to admission -Continue Remeron 30 mg HS  -Appreciate assistance of psych team. It appears we have maxed out on all psychotropic medications for this patient.  It is noted that many of these medications will take weeks to months for full therapeutic benefit.  Given her persistent severe behavioral swings that include violent episodes of physically attacking staff she is not a candidate for an SNF and needs more aggressive therapies.  Psychiatry [facility]  [facility].  -Patient does not have chronic seizure disorder but did have status epilepticus in September 2021 likely 2/2 acute methadone withdrawal.  -Behavior continues to very from calm cooperative and oriented to sad and delusional to aggressive/violent and delusional involving attacking staff.  Continue to await acceptance to high-level psychiatric unit that can also provide ECT  Severe insomnia -Continue Remeron and trazodone-we will increase trazodone dosage to 150 mg -Can supplement with Vistaril would need to do so with caution given drug drug interaction that can cause movement disorder -Suspect recurrent insomnia related to patient sleeping more during the day after requiring sedation for aggressive and violent behaviors. -1/20: Increased dose of trazodone appears to be working with staff reporting patient slept through the night.  As of 1/21 patient denies excessive daytime sleepiness and improved nighttime sleeping.  Status epilepticus -See above regarding suspected methamphetamine use prior to admission.  UDS was negative but patient did admit to abruptly stopping methadone preceding hospitalization and this may be reason for onset of seizures i.e. withdrawal seizures -Repeat MRI on 12/28 within normal limits and it is  felt behavioral issues related to brain injury sequelae and chronic psychiatric condition.  Neurology has signed off -Vimpat, Dilantin and phenobarbital have been tapered and discontinued at recommendation of neurologist  History of polysubstance abuse with heroin/history of outpatient methadone treatment -Prescribed methadone 60 mg daily from a clinic in [place] prior to admission -Has not required methadone since arrival therefore we will continue naltrexone to treat heroin addiction in addition to impulsivity and hypersexual behaviors.  This is the last drug clinicians utilize to maintain stability after patients have been successful with methadone.  Would not return to methadone at this juncture if avoidable.    Depression and severe anxiety disorder/PTSD/no bipolar disorder now with schizoaffective features -She continues to demonstrate extremes in behavior: she was either calm and mostly appropriate but mildly confused vs extremely withdrawn sad and crying vs extremely agitated and violent.  Because of this she remains under IVC (last updated 1/19) -Extremes in behavior precipitated by rumination over situation regarding her children which she gave up for adoption and are unable to physically see while hospitalized -Continue Klonopin as above-further tapering at this time -Continue Tegretol 600 mg TID (max dose)-recent LFTs stable -She was reevaluated by psychiatry who recommended can transition from Tegretol to Depakote if LFTs increase in the context of her underlying hepatitis C -Scheduled Vistaril discontinued due to drug to drug interaction with Geodon -On 1/6 Lexapro discontinued in favor of Celexa by psychiatry team -On 1/12 patient did confirm to me that she has previously been diagnosed with bipolar disorder -See above regarding behavioral manifestations of concomitant brain injury and bipolar disorder with schizoaffective features -Continue IM Geodon every 8-12 hours as needed  severe agitation  Recurrent constipation -Continue Colace 100 mg twice daily prn -Continue MiraLAX to twice daily     Other problems: Nonobstructive transaminitis in context of hepatitis C -Elevated HCV antibody with markedly elevated HCV RNA quantitative level  consistent with chronic hepatitis C  -LFTs remain stable without an obstructive pattern noted -11/10: Discussed with ID Dr. Manson PasseyMandahar. Treatment of hepatitis C typically is initiated in the outpatient setting. Medications can be quite expensive and usually take time to obtain insurance approval.  -ID recommends scheduling outpatient appointment their clinic closer to discharge date  Back pain 2/2 abnormal urinalysis consistent with UTI -Patient with low-grade fever overnight and is now having back pain for days not responsive to NSAIDs and muscle relaxers -DG lumbar spine unremarkable -During hospitalization patient has been treated for pansensitive E. coli UTI as well as Salmonella UTI both of which have been sensitive to Levaquin -Completed Levaquin 12/20-urine culture obtained after antibiotics initiated and showed no growth -CT abdomen and  pelvis on 12/17 unremarkable  Salmonella UTI -Completed amoxicillin  Abnormal urinalysis -Not consistent with UTI, was positive for moderate hemoglobin and greater than 50 RBCs with an elevated specific gravity -Suspect dehydration -Patient denies back pain or history of renal calculi although given her confusion unclear if this history is accurate  Physical deconditioning -Resolved -Does continue to exhibit impulsivity but maintains appropriate balance and gait no longer requires one-to-one safety sitter  Large hemorrhoids. -Resolved -Markedly improved-LD Proctofoam 12/15  Dysphagia -Resolved -Continue regular diet   Data Reviewed: Basic Metabolic Panel: No results for input(s): NA, K, CL, CO2, GLUCOSE, BUN, CREATININE, CALCIUM, MG, PHOS in the last 168  hours. Liver Function Tests: No results for input(s): AST, ALT, ALKPHOS, BILITOT, PROT, ALBUMIN in the last 168 hours. No results for input(s): LIPASE, AMYLASE in the last 168 hours. No results for input(s): AMMONIA in the last 168 hours. CBC: No results for input(s): WBC, NEUTROABS, HGB, HCT, MCV, PLT in the last 168 hours. Cardiac Enzymes: No results for input(s): CKTOTAL, CKMB, CKMBINDEX, TROPONINI in the last 168 hours. BNP (last 3 results) No results for input(s): BNP in the last 8760 hours.  ProBNP (last 3 results) No results for input(s): PROBNP in the last 8760 hours.  CBG: No results for input(s): GLUCAP in the last 168 hours.  No results found for this or any previous visit (from the past 240 hour(s)).   Studies: No results found.  Scheduled Meds:  amoxicillin-clavulanate  1 tablet Oral Q8H   carbamazepine  600 mg Oral TID   Chlorhexidine Gluconate Cloth  6 each Topical Daily   citalopram  20 mg Oral Daily   clonazePAM  1 mg Oral Q6H   cloNIDine  0.1 mg Transdermal Weekly   diclofenac Sodium  4 g Topical QID   docusate sodium  100 mg Oral BID   feeding supplement  237 mL Oral BID BM   melatonin  10 mg Oral QHS   metroNIDAZOLE  500 mg Oral Q8H   mirtazapine  30 mg Oral QHS   multivitamin with minerals  1 tablet Oral Daily   naltrexone  25 mg Oral Daily   polyethylene glycol  17 g Oral BID   vitamin B-6  100 mg Oral Daily   thiamine  100 mg Oral Daily   traZODone  150 mg Oral QHS   ziprasidone  80 mg Oral 2 times per day   Continuous Infusions:   Principal Problem:   Delirium due to multiple etiologies Active Problems:   Acute metabolic encephalopathy   Hypokalemia   Polysubstance abuse (HCC)   Elevated CK   Transaminitis   Refractory seizure (HCC)   Chronic hepatitis C without hepatic coma (HCC)   Distended abdomen   Palliative care encounter   Colonic Ileus (HCC)   Organic brain syndrome   On enteral nutrition   Physical  deconditioning   Protein-calorie malnutrition (HCC)   Inadequate oral nutritional intake   Impulse disorder   Consultants:  Neurology  Psychiatry  Interventional radiology  Surgery    Procedures:  9/14 lumbar puncture  9/15 EEG  9/16 EEG  9/17 EEG  9/19 overnight EEG with video  9/22 overnight EEG with video with discontinuation of long-term EEG monitoring on 9/25  9/24 core track placement  10/6 EEG  12/15 PEG tube removed    Antibiotics: Anti-infectives (From admission, onward)   Start     Dose/Rate Route Frequency Ordered Stop   05/11/20 0845  amoxicillin-clavulanate (AUGMENTIN) 500-125 MG per  tablet 500 mg        1 tablet Oral Every 8 hours 05/11/20 0750 05/18/20 0559   05/11/20 0845  metroNIDAZOLE (FLAGYL) tablet 500 mg        500 mg Oral Every 8 hours 05/11/20 0750 05/18/20 0559   04/05/20 1415  levofloxacin (LEVAQUIN) tablet 500 mg        500 mg Oral Daily 04/05/20 1327 04/11/20 0911   03/19/20 1000  metroNIDAZOLE (FLAGYL) tablet 500 mg  Status:  Discontinued        500 mg Per Tube 2 times daily 03/19/20 0822 03/23/20 0827   03/18/20 1200  amoxicillin (AMOXIL) 250 MG/5ML suspension 500 mg  Status:  Discontinued        500 mg Per Tube Every 8 hours 03/18/20 0915 03/23/20 0559   03/17/20 1200  cefTRIAXone (ROCEPHIN) 1 g in sodium chloride 0.9 % 100 mL IVPB  Status:  Discontinued        1 g 200 mL/hr over 30 Minutes Intravenous Every 24 hours 03/17/20 1048 03/18/20 0913   03/16/20 1130  metroNIDAZOLE (FLAGYL) 50 mg/ml oral suspension 500 mg  Status:  Discontinued        500 mg Per Tube 2 times daily 03/16/20 1040 03/19/20 0821   03/16/20 1130  fluconazole (DIFLUCAN) 40 MG/ML suspension 152 mg        150 mg Per Tube  Once 03/16/20 1040 03/16/20 1245   02/11/20 1526  ceFAZolin (ANCEF) IVPB 2g/100 mL premix        2 g 200 mL/hr over 30 Minutes Intravenous  Once 02/11/20 1526 02/11/20 1641   02/05/20 0600  ceFAZolin (ANCEF) IVPB 2g/100 mL premix         2 g 200 mL/hr over 30 Minutes Intravenous To Short Stay 02/04/20 1054 02/05/20 0950   01/29/20 0600  ceFAZolin (ANCEF) IVPB 2g/100 mL premix        2 g 200 mL/hr over 30 Minutes Intravenous On call to O.R. 01/28/20 1511 01/30/20 0559   01/28/20 2000  cefTRIAXone (ROCEPHIN) 1 g in sodium chloride 0.9 % 100 mL IVPB        1 g 200 mL/hr over 30 Minutes Intravenous Every 24 hours 01/28/20 1925 02/02/20 0724   01/06/20 0900  cefTRIAXone (ROCEPHIN) 2 g in sodium chloride 0.9 % 100 mL IVPB  Status:  Discontinued        2 g 200 mL/hr over 30 Minutes Intravenous Every 12 hours 01/06/20 0105 01/06/20 2202   01/06/20 0800  vancomycin (VANCOCIN) IVPB 1000 mg/200 mL premix  Status:  Discontinued       "Followed by" Linked Group Details   1,000 mg 200 mL/hr over 60 Minutes Intravenous Every 12 hours 01/05/20 1904 01/06/20 2202   01/05/20 2000  vancomycin (VANCOREADY) IVPB 1500 mg/300 mL       "Followed by" Linked Group Details   1,500 mg 150 mL/hr over 120 Minutes Intravenous  Once 01/05/20 1904 01/06/20 0127   01/05/20 1830  cefTRIAXone (ROCEPHIN) 2 g in sodium chloride 0.9 % 100 mL IVPB        2 g 200 mL/hr over 30 Minutes Intravenous  Once 01/05/20 1829 01/05/20 2220       Time spent: 25 minutes    [name] ANP  [facility] 7 am - 330 pm/M-F for direct patient care and secure chat Please see Amion for contact information 128 days

## 2020-05-14 MED ORDER — STERILE WATER FOR INJECTION IJ SOLN
INTRAMUSCULAR | Status: AC
  Filled 2020-05-14: qty 10

## 2020-05-14 NOTE — Progress Notes (Signed)
Pt severely agitated with sitter and EVS staff. Geodon IM admin. Security at bedside. Will continue to monitor.

## 2020-05-14 NOTE — Progress Notes (Addendum)
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  She presented to [facility] ER 3 times prior to admission.  On her 4th presentation she was admitted.  On 9/2 she was diagnosed with acute cystitis with hematuria and was discharged home on Keflex and Zofran.  9/9 she presented with anxiety and symptoms consistent with a panic attack.  She was discharged from the ER with prescription for Vistaril.  She again presented to the ER on 9/10  reporting significant anxiety and severe insomnia and stating the Vistaril was not working.  She was given Ativan while in the ER and this appeared to improve her symptoms.  She was stable for discharge and instructed to take Benadryl at bedtime to assist with her insomnia.     Subjective:   May 14, 2020 Seen and examined at bedside.  She was lying quietly in bed sleeping she responded appropriately to question   Assessment/Plan: Principal Problem:   Delirium due to multiple etiologies Active Problems:   Acute metabolic encephalopathy   Hypokalemia   Polysubstance abuse (HCC)   Elevated CK   Transaminitis   Refractory seizure (HCC)   Chronic hepatitis C without hepatic coma (HCC)   Distended abdomen   Palliative care encounter   Colonic Ileus (HCC)   Organic brain syndrome   On enteral nutrition   Physical deconditioning   Protein-calorie malnutrition (HCC)   Inadequate oral nutritional intake   Impulse disorder  1.  Urinary tract infection continue Augmentin  2.  Severe anxiety disorder/PTSD.  Patient is IVC need continue current medication with Klonopin Tegretol Depakote Lexapro  3.  History of polysubstance abuse    Code Status: Full  Severity of Illness: The appropriate patient status for this  patient is INPATIENT. Inpatient status is judged to be reasonable and necessary in order to provide the required intensity of service to ensure the patient's safety. The patient's presenting symptoms, physical exam findings, and initial radiographic and laboratory data in the context of their chronic comorbidities is felt to place them at high risk for further clinical deterioration. Furthermore, it is not anticipated that the patient will be medically stable for discharge from the hospital within 2 midnights of admission. The following factors support the patient status of inpatient.   " The patient's presenting symptoms include psychiatry unstable. " The worrisome physical exam findings include unstable psychiatry symptoms. " The initial radiographic and laboratory data are worrisome because of unsafe to be discharged. " The chronic co-morbidities include psychiatric condition.   * I certify that at the point of admission it is my clinical judgment that the patient will require inpatient hospital care spanning beyond 2 midnights from the point of admission due to high intensity of service, high risk for further deterioration and high frequency of surveillance required.*    Family Communication: None  Disposition Plan: Waiting for psychiatric bed for transfer   Consultants:  Psychiatry  Procedures:  None  Antimicrobials:  Augmentin  Metronidazole  DVT prophylaxis:     Objective: Vitals:   05/13/20 0530 05/13/20 1503 05/13/20 2159 05/14/20 0508  BP: 95/65 102/70 96/60 99/79   Pulse: 60 (!) 59 (!) 55 63  Resp: 16 16 16 17   Temp: 98 F (36.7 C) 98.9 F (37.2 C) 98.7 F (37.1 C) 98.4 F (36.9 C)  TempSrc: Oral Oral Oral Oral  SpO2: 98% 98% 98% 99%  Weight:      Height:        Intake/Output Summary (Last 24 hours) at 05/14/2020 0930 Last data filed at 05/14/2020 0630 Gross per 24 hour  Intake 600 ml  Output --  Net 600 ml   Filed Weights   04/04/20 7253 04/05/20  0613 05/08/20 0500  Weight: 70.9 kg 71.6 kg 74.8 kg   Body mass index is 27.44 kg/m.  Exam:  . General: 35 y.o. year-old female well developed well nourished in no acute distress.  Alert and oriented x3. . Cardiovascular: Regular rate and rhythm with no rubs or gallops.  No thyromegaly or JVD noted.   Marland Kitchen Respiratory: Clear to auscultation with no wheezes or rales. Good inspiratory effort. . Abdomen: Soft nontender nondistended with normal bowel sounds x4 quadrants. . Musculoskeletal: No lower extremity edema. 2/4 pulses in all 4 extremities. . Skin: No ulcerative lesions noted or rashes, . Psychiatry: Mood is appropriate for condition and setting    Data Reviewed: CBC: No results for input(s): WBC, NEUTROABS, HGB, HCT, MCV, PLT in the last 168 hours. Basic Metabolic Panel: No results for input(s): NA, K, CL, CO2, GLUCOSE, BUN, CREATININE, CALCIUM, MG, PHOS in the last 168 hours. GFR: Estimated Creatinine Clearance: 98.4 mL/min (by C-G formula based on SCr of 0.66 mg/dL). Liver Function Tests: No results for input(s): AST, ALT, ALKPHOS, BILITOT, PROT, ALBUMIN in the last 168 hours. No results for input(s): LIPASE, AMYLASE in the last 168 hours. No results for input(s): AMMONIA in the last 168 hours. Coagulation Profile: No results for input(s): INR, PROTIME in the last 168 hours. Cardiac Enzymes: No results for input(s): CKTOTAL, CKMB, CKMBINDEX, TROPONINI in the last 168 hours. BNP (last 3 results) No results for input(s): PROBNP in the last 8760 hours. HbA1C: No results for input(s): HGBA1C in the last 72 hours. CBG: No results for input(s): GLUCAP in the last 168 hours. Lipid Profile: No results for input(s): CHOL, HDL, LDLCALC, TRIG, CHOLHDL, LDLDIRECT in the last 72 hours. Thyroid Function Tests: No results for input(s): TSH, T4TOTAL, FREET4, T3FREE, THYROIDAB in the last 72 hours. Anemia Panel: No results for input(s): VITAMINB12, FOLATE, FERRITIN, TIBC, IRON,  RETICCTPCT in the last 72 hours. Urine analysis:    Component Value Date/Time   COLORURINE AMBER (A) 04/20/2020 0505   APPEARANCEUR HAZY (A) 04/20/2020 0505   LABSPEC 1.023 04/20/2020 0505   PHURINE 5.0 04/20/2020 0505   GLUCOSEU NEGATIVE 04/20/2020 0505   HGBUR MODERATE (A) 04/20/2020 0505   BILIRUBINUR NEGATIVE 04/20/2020 0505   KETONESUR NEGATIVE 04/20/2020 0505   PROTEINUR NEGATIVE 04/20/2020 0505   NITRITE NEGATIVE 04/20/2020 0505   LEUKOCYTESUR NEGATIVE 04/20/2020 0505   Sepsis Labs: @LABRCNTIP (procalcitonin:4,lacticidven:4)  )No results found for this or any previous visit (from the past 240 hour(s)).    Studies: No results found.  Scheduled Meds: . amoxicillin-clavulanate  1 tablet Oral Q8H  . carbamazepine  600 mg Oral TID  . Chlorhexidine Gluconate Cloth  6 each Topical Daily  . citalopram  20 mg Oral Daily  . clonazePAM  1 mg Oral Q6H  . cloNIDine  0.1 mg Transdermal Weekly  . diclofenac Sodium  4 g Topical QID  . docusate sodium  100 mg Oral BID  . feeding supplement  237 mL Oral BID BM  . melatonin  10 mg Oral QHS  . metroNIDAZOLE  500 mg Oral Q8H  . mirtazapine  30 mg Oral QHS  .  multivitamin with minerals  1 tablet Oral Daily  . naltrexone  25 mg Oral Daily  . polyethylene glycol  17 g Oral BID  . vitamin B-6  100 mg Oral Daily  . thiamine  100 mg Oral Daily  . traZODone  150 mg Oral QHS  . ziprasidone  80 mg Oral 2 times per day    Continuous Infusions:   LOS: 129 days     Myrtie Neither, MD [facility]  To reach me or the doctor on call, go to: [email or web address] Password [email or web address]  05/14/2020, 9:30 AM

## 2020-05-15 MED ORDER — STERILE WATER FOR INJECTION IJ SOLN
INTRAMUSCULAR | Status: AC
  Filled 2020-05-15: qty 10

## 2020-05-15 NOTE — Progress Notes (Addendum)
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  She presented to [facility] ER 3 times prior to admission.  On her 4th presentation she was admitted.  On 9/2 she was diagnosed with acute cystitis with hematuria and was discharged home on Keflex and Zofran.  9/9 she presented with anxiety and symptoms consistent with a panic attack.  She was discharged from the ER with prescription for Vistaril.  She again presented to the ER on 9/10  reporting significant anxiety and severe insomnia and stating the Vistaril was not working.  She was given Ativan while in the ER and this appeared to improve her symptoms.  She was stable for discharge and instructed to take Benadryl at bedtime to assist with her insomnia.     Subjective: May 15, 2020 Patient seen and examined at bedside.  Nurse reported security had to be called because of extreme agitation patient is lying quietly in bed sleeping.  I woke her up without any incident.  She denies any complaints  May 14, 2020 Seen and examined at bedside.  She was lying quietly in bed sleeping she responded appropriately to question   Assessment/Plan: Principal Problem:   Delirium due to multiple etiologies Active Problems:   Acute metabolic encephalopathy   Hypokalemia   Polysubstance abuse (HCC)   Elevated CK   Transaminitis   Refractory seizure (HCC)   Chronic hepatitis C without hepatic coma (HCC)   Distended abdomen   Palliative care encounter   Colonic Ileus (HCC)   Organic brain syndrome   On enteral nutrition   Physical deconditioning   Protein-calorie malnutrition (HCC)   Inadequate oral nutritional intake   Impulse disorder  1.  Urinary tract infection continue Augmentin  2.  Severe anxiety  disorder/PTSD.  Patient is IVC need continue current medication with Klonopin Tegretol Depakote Lexapro  3.  History of polysubstance abuse    Code Status: Full  Severity of Illness: The appropriate patient status for this patient is INPATIENT. Inpatient status is judged to be reasonable and necessary in order to provide the required intensity of service to ensure the patient's safety. The patient's presenting symptoms, physical exam findings, and initial radiographic and laboratory data in the context of their chronic comorbidities is felt to place them at high risk for further clinical deterioration. Furthermore, it is not anticipated that the patient will be medically stable for discharge from the hospital within 2 midnights of admission. The following factors support the patient status of inpatient.   " The patient's presenting symptoms include psychiatry unstable. " The worrisome physical exam findings include unstable psychiatry symptoms. " The initial radiographic and laboratory data are worrisome because of unsafe to be discharged. " The chronic co-morbidities include psychiatric condition.   * I certify that at the point of admission it is my clinical judgment that the patient will require inpatient hospital care spanning beyond 2 midnights from the point of admission due to high intensity of service, high risk for further deterioration and high frequency of surveillance required.*    Family Communication: None  Disposition Plan: Waiting for psychiatric bed for transfer   Consultants:  Psychiatry  Procedures:  None  Antimicrobials:  Augmentin  Metronidazole  DVT prophylaxis:     Objective: Vitals:   05/13/20 2159 05/14/20 0508 05/14/20 1453  05/15/20 0600  BP: 96/60 99/79 104/65 110/60  Pulse: (!) 55 63 72 68  Resp: 16 17 18 18   Temp: 98.7 F (37.1 C) 98.4 F (36.9 C) 99.5 F (37.5 C) 97.8 F (36.6 C)  TempSrc: Oral Oral Oral Oral  SpO2: 98% 99% 99% 100%   Weight:      Height:        Intake/Output Summary (Last 24 hours) at 05/15/2020 1024 Last data filed at 05/15/2020 05/17/2020 Gross per 24 hour  Intake 720 ml  Output -  Net 720 ml   Filed Weights   04/04/20 0632 04/05/20 0613 05/08/20 0500  Weight: 70.9 kg 71.6 kg 74.8 kg   Body mass index is 27.44 kg/m.  Exam:  . General: 35 y.o. year-old female well developed well nourished in no acute distress.  Alert and oriented x3. . Cardiovascular: Regular rate and rhythm with no rubs or gallops.  No thyromegaly or JVD noted.   31 Respiratory: Clear to auscultation with no wheezes or rales. Good inspiratory effort. . Abdomen: Soft nontender nondistended with normal bowel sounds x4 quadrants. . Musculoskeletal: No lower extremity edema. 2/4 pulses in all 4 extremities. . Skin: No ulcerative lesions noted or rashes, . Psychiatry: Mood is appropriate for condition and setting    Data Reviewed: CBC: No results for input(s): WBC, NEUTROABS, HGB, HCT, MCV, PLT in the last 168 hours. Basic Metabolic Panel: No results for input(s): NA, K, CL, CO2, GLUCOSE, BUN, CREATININE, CALCIUM, MG, PHOS in the last 168 hours. GFR: Estimated Creatinine Clearance: 98.4 mL/min (by C-G formula based on SCr of 0.66 mg/dL). Liver Function Tests: No results for input(s): AST, ALT, ALKPHOS, BILITOT, PROT, ALBUMIN in the last 168 hours. No results for input(s): LIPASE, AMYLASE in the last 168 hours. No results for input(s): AMMONIA in the last 168 hours. Coagulation Profile: No results for input(s): INR, PROTIME in the last 168 hours. Cardiac Enzymes: No results for input(s): CKTOTAL, CKMB, CKMBINDEX, TROPONINI in the last 168 hours. BNP (last 3 results) No results for input(s): PROBNP in the last 8760 hours. HbA1C: No results for input(s): HGBA1C in the last 72 hours. CBG: No results for input(s): GLUCAP in the last 168 hours. Lipid Profile: No results for input(s): CHOL, HDL, LDLCALC, TRIG, CHOLHDL,  LDLDIRECT in the last 72 hours. Thyroid Function Tests: No results for input(s): TSH, T4TOTAL, FREET4, T3FREE, THYROIDAB in the last 72 hours. Anemia Panel: No results for input(s): VITAMINB12, FOLATE, FERRITIN, TIBC, IRON, RETICCTPCT in the last 72 hours. Urine analysis:    Component Value Date/Time   COLORURINE AMBER (A) 04/20/2020 0505   APPEARANCEUR HAZY (A) 04/20/2020 0505   LABSPEC 1.023 04/20/2020 0505   PHURINE 5.0 04/20/2020 0505   GLUCOSEU NEGATIVE 04/20/2020 0505   HGBUR MODERATE (A) 04/20/2020 0505   BILIRUBINUR NEGATIVE 04/20/2020 0505   KETONESUR NEGATIVE 04/20/2020 0505   PROTEINUR NEGATIVE 04/20/2020 0505   NITRITE NEGATIVE 04/20/2020 0505   LEUKOCYTESUR NEGATIVE 04/20/2020 0505   Sepsis Labs: @LABRCNTIP (procalcitonin:4,lacticidven:4)  )No results found for this or any previous visit (from the past 240 hour(s)).    Studies: No results found.  Scheduled Meds: . amoxicillin-clavulanate  1 tablet Oral Q8H  . carbamazepine  600 mg Oral TID  . Chlorhexidine Gluconate Cloth  6 each Topical Daily  . citalopram  20 mg Oral Daily  . clonazePAM  1 mg Oral Q6H  . cloNIDine  0.1 mg Transdermal Weekly  . diclofenac Sodium  4 g Topical QID  . docusate sodium  100 mg Oral BID  . feeding supplement  237 mL Oral BID BM  . melatonin  10 mg Oral QHS  . metroNIDAZOLE  500 mg Oral Q8H  . mirtazapine  30 mg Oral QHS  . multivitamin with minerals  1 tablet Oral Daily  . naltrexone  25 mg Oral Daily  . polyethylene glycol  17 g Oral BID  . vitamin B-6  100 mg Oral Daily  . sterile water (preservative free)      . thiamine  100 mg Oral Daily  . traZODone  150 mg Oral QHS  . ziprasidone  80 mg Oral 2 times per day    Continuous Infusions:   LOS: 130 days     Myrtie Neither, MD [facility]  To reach me or the doctor on call, go to: [email or web address] Password [email or web address]  05/15/2020, 10:24 AM

## 2020-05-15 NOTE — Progress Notes (Signed)
Security called for pt extreme agitation.

## 2020-05-16 MED ORDER — STERILE WATER FOR INJECTION IJ SOLN
INTRAMUSCULAR | Status: AC
  Filled 2020-05-16: qty 10

## 2020-05-16 NOTE — Progress Notes (Addendum)
[facility] PROGRESS NOTE  Anita Davis BDZ:[id number] DOB: [birth year] DOA: 01/04/2020 PCP: [name], PA-C    02/10/20                      02/15/20                       04/08/20   Status: Remains inpatient appropriate because:Altered mental status, Unsafe d/c plan and Inpatient level of care appropriate due to severity of illness   Dispo: The patient is from: Home              Anticipated d/c is to: SNF              Anticipated d/c date is: > 3 days              Patient currently is medically stable to d/c.  Barriers to discharge; continued high severity psychiatric illness with waxing and waning mentation with violent outburst requiring IVC and anticipated discharge long-term to state psychiatric facility. We are also pursuing [facility] at family request  Patient remains a priority admission at [facility] and [facility].  Her extremes in behavior and orientation/mentation along with violent outbursts including physically attacking staff warrants urgent transfer to inpatient psychiatric unit.    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  Patient presented to [facility], ER in [place] several times prior to admission.  Initial presentation was related to symptoms from acute cystitis.  Two other presentations related to insomnia.  On those occasions she was able to be discharged from the ER.  She returned again on 13 September being brought in by family for altered mental status.  After admission this patient developed seizure activity which  progressed to status epilepticus therefore she was transferred to [facility] for further neurological evaluation.  After several days recurrent seizures were aborted with combination of 3 antiepileptic drugs.  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  Fortunately this did not improve patient's mentation and she continued to have waxing and waning episodes of alertness and encephalopathy.  Since admission she has not had any further seizures so these medications were discontinued.  She has eventually awakened behaviors alternating between awake and appropriate, awake crying and depressed occasionally delusional, severe agitation and aggression with significant delusions.  She has been evaluated both by neurology and psychiatry who suspect a combination of sequelae from underlying brain injury as well as bipolar disorder with schizoaffective features.  During one of her alert phases patient did admit to being diagnosed with bipolar disease prior to admission.  Patient is on multiple psychotropic medications continues to have episodes of violent outbursts.  She has required frequent doses of IM Geodon as well as Recruitment consultant and frequent intervention by security staff during her episodes of severe agitation   Subjective: Patient drowsy but awake.  Somewhat subdued and responsive with flat affect.  No specific complaints.   Objective: Vitals:   05/15/20 1204 05/15/20 2054  BP: 97/69 97/70  Pulse: 60 64  Resp: 18 16  Temp: 98.1 F (36.7 C) 98.2 F (36.8 C)  SpO2: 98% 99%    Intake/Output Summary (Last 24 hours) at 05/16/2020 0904 Last data filed at 05/16/2020 0819 Gross per 24 hour  Intake 1200 ml  Output --  Net 1200 ml   Filed Weights   04/04/20 6433 04/05/20 0613 05/08/20 0500  Weight: 70.9 kg 71.6 kg 74.8 kg    Exam: General: Awake but drowsy, calm, flat affect Pulmonary:   Lungs clear to auscultation, stable on room air Cardiac:  Normal heart sounds, pulse regular nontachycardic, extremities warm to touch with adequate capillary Abdomen: Tolerating diet.  Bowel sounds present.  LBM 1/23 Neurological: Moves all extremities x4, no focal neurological deficits, no tremors or rigidity/EPS sx's Psychiatric: Awake and oriented times name and place.  Questionably to year.  Flat affect and currently not agitated.     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 During periods of alertness and lucidity patient denied use of methamphetamines prior to admission although did admit to abruptly stopping her methadone -Continue max dose Geodon 80 mg BID, naltrexone, scheduled Klonopin, Tegretol, Celexa and Remeron at hour of sleep -Continue 1:1 safety sitter and restraints as needed for aggressive behavior -In regards to her bizarre behavior she was exhibiting similar symptoms on date of admission and 24 hours previous to admission -Appreciate assistance of psych team. It appears we have maxed out on all psychotropic medications for this patient.  It is noted that many of these medications will take weeks to months for full therapeutic benefit.  Given her persistent severe behavioral swings that include violent episodes of physically attacking staff it has been determined that patient would benefit from aggressive psychiatric treatment at [facility] including evaluation for possible ECT  --Continue IM Geodon every 8-12  hours as needed severe agitation; as 1/22 patient has required a total of 4 doses of IM Geodon for breakthrough agitation with 2 doses being required on 1/22  Abnormal urinalysis -Did not appear to be consistent with UTI more consistent with dehydration.  Unfortunately urine culture was never collected -Attending physician begin empiric Augmentin on 1/19 -Continue to have low-grade fevers therefore urine culture ordered on 1/24 and has been obtained  Severe insomnia -Continue Remeron and trazodone   History of polysubstance abuse with heroin/history of outpatient methadone treatment -Prescribed methadone 60 mg daily from a clinic in [place] prior to admission -Has not required methadone since arrival therefore we will continue naltrexone to treat heroin addiction in addition to impulsivity and hypersexual behaviors.  This is the last drug clinicians utilize to maintain stability after patients have been successful with methadone.  Would not return to methadone at this juncture if avoidable.    Depression and severe anxiety disorder/PTSD/no bipolar disorder now with schizoaffective features -She continues to demonstrate extremes in behavior: she was either calm and mostly appropriate but mildly confused vs extremely withdrawn sad and crying vs extremely agitated and violent.  Because of this she remains under IVC (last updated 1/19) -See above regarding pharmacotherapy    Other problems: Status epilepticus -Resolved -Suspect precipitated by abrupt withdrawal of methadone as reported  by patient -Repeat MRI on 12/28 within normal limits and it is felt behavioral issues related to brain injury sequelae and chronic psychiatric condition.  Neurology has signed off -Vimpat, Dilantin and phenobarbital have been tapered and discontinued at recommendation of neurologist  Nonobstructive transaminitis in context of hepatitis C -Elevated HCV antibody with markedly elevated HCV RNA quantitative  level  consistent with chronic hepatitis C  -LFTs remain stable without an obstructive pattern noted -11/10: Discussed with ID Dr. Manson PasseyMandahar. Treatment of hepatitis C typically is initiated in the outpatient setting. Medications can be quite expensive and usually take time to obtain insurance approval.  -ID recommends scheduling outpatient appointment their clinic closer to discharge date  Recurrent constipation -Continue Colace 100 mg twice daily prn -Continue MiraLAX to twice daily   Back pain 2/2 abnormal urinalysis consistent with UTI -Patient with low-grade fever overnight and is now having back pain for days not responsive to NSAIDs and muscle relaxers -DG lumbar spine unremarkable -During hospitalization patient has been treated for pansensitive E. coli UTI as well as Salmonella UTI both of which have been sensitive to Levaquin -Completed Levaquin 12/20-urine culture obtained after antibiotics initiated and showed no growth -CT abdomen and pelvis on 12/17 unremarkable  Salmonella UTI -Completed amoxicillin  Abnormal urinalysis -Not consistent with UTI, was positive for moderate hemoglobin and greater than 50 RBCs with an elevated specific gravity -Suspect dehydration -Patient denies back pain or history of renal calculi although given her confusion unclear if this history is accurate  Physical deconditioning -Resolved -Does continue to exhibit impulsivity but maintains appropriate balance and gait no longer requires one-to-one safety sitter  Large hemorrhoids. -Resolved -Markedly improved-LD Proctofoam 12/15  Dysphagia -Resolved -Continue regular diet   Data Reviewed: Basic Metabolic Panel: No results for input(s): NA, K, CL, CO2, GLUCOSE, BUN, CREATININE, CALCIUM, MG, PHOS in the last 168 hours. Liver Function Tests: No results for input(s): AST, ALT, ALKPHOS, BILITOT, PROT, ALBUMIN in the last 168 hours. No results for input(s): LIPASE, AMYLASE in the last  168 hours. No results for input(s): AMMONIA in the last 168 hours. CBC: No results for input(s): WBC, NEUTROABS, HGB, HCT, MCV, PLT in the last 168 hours. Cardiac Enzymes: No results for input(s): CKTOTAL, CKMB, CKMBINDEX, TROPONINI in the last 168 hours. BNP (last 3 results) No results for input(s): BNP in the last 8760 hours.  ProBNP (last 3 results) No results for input(s): PROBNP in the last 8760 hours.  CBG: No results for input(s): GLUCAP in the last 168 hours.  No results found for this or any previous visit (from the past 240 hour(s)).   Studies: No results found.  Scheduled Meds: . amoxicillin-clavulanate  1 tablet Oral Q8H  . carbamazepine  600 mg Oral TID  . Chlorhexidine Gluconate Cloth  6 each Topical Daily  . citalopram  20 mg Oral Daily  . clonazePAM  1 mg Oral Q6H  . cloNIDine  0.1 mg Transdermal Weekly  . diclofenac Sodium  4 g Topical QID  . docusate sodium  100 mg Oral BID  . feeding supplement  237 mL Oral BID BM  . melatonin  10 mg Oral QHS  . metroNIDAZOLE  500 mg Oral Q8H  . mirtazapine  30 mg Oral QHS  . multivitamin with minerals  1 tablet Oral Daily  . naltrexone  25 mg Oral Daily  . polyethylene glycol  17 g Oral BID  . vitamin B-6  100 mg Oral Daily  . thiamine  100 mg Oral Daily  .  traZODone  150 mg Oral QHS  . ziprasidone  80 mg Oral 2 times per day   Continuous Infusions:   Principal Problem:   Delirium due to multiple etiologies Active Problems:   Acute metabolic encephalopathy   Hypokalemia   Polysubstance abuse (HCC)   Elevated CK   Transaminitis   Refractory seizure (HCC)   Chronic hepatitis C without hepatic coma (HCC)   Distended abdomen   Palliative care encounter   Colonic Ileus (HCC)   Organic brain syndrome   On enteral nutrition   Physical deconditioning   Protein-calorie malnutrition (HCC)   Inadequate oral nutritional intake   Impulse disorder   Consultants:  Neurology  Psychiatry  Interventional  radiology  Surgery    Procedures:  9/14 lumbar puncture  9/15 EEG  9/16 EEG  9/17 EEG  9/19 overnight EEG with video  9/22 overnight EEG with video with discontinuation of long-term EEG monitoring on 9/25  9/24 core track placement  10/6 EEG  12/15 PEG tube removed    Antibiotics: Anti-infectives (From admission, onward)   Start     Dose/Rate Route Frequency Ordered Stop   05/11/20 0845  amoxicillin-clavulanate (AUGMENTIN) 500-125 MG per tablet 500 mg        1 tablet Oral Every 8 hours 05/11/20 0750 05/18/20 0559   05/11/20 0845  metroNIDAZOLE (FLAGYL) tablet 500 mg        500 mg Oral Every 8 hours 05/11/20 0750 05/18/20 0559   04/05/20 1415  levofloxacin (LEVAQUIN) tablet 500 mg        500 mg Oral Daily 04/05/20 1327 04/11/20 0911   03/19/20 1000  metroNIDAZOLE (FLAGYL) tablet 500 mg  Status:  Discontinued        500 mg Per Tube 2 times daily 03/19/20 0822 03/23/20 0827   03/18/20 1200  amoxicillin (AMOXIL) 250 MG/5ML suspension 500 mg  Status:  Discontinued        500 mg Per Tube Every 8 hours 03/18/20 0915 03/23/20 0559   03/17/20 1200  cefTRIAXone (ROCEPHIN) 1 g in sodium chloride 0.9 % 100 mL IVPB  Status:  Discontinued        1 g 200 mL/hr over 30 Minutes Intravenous Every 24 hours 03/17/20 1048 03/18/20 0913   03/16/20 1130  metroNIDAZOLE (FLAGYL) 50 mg/ml oral suspension 500 mg  Status:  Discontinued        500 mg Per Tube 2 times daily 03/16/20 1040 03/19/20 0821   03/16/20 1130  fluconazole (DIFLUCAN) 40 MG/ML suspension 152 mg        150 mg Per Tube  Once 03/16/20 1040 03/16/20 1245   02/11/20 1526  ceFAZolin (ANCEF) IVPB 2g/100 mL premix        2 g 200 mL/hr over 30 Minutes Intravenous  Once 02/11/20 1526 02/11/20 1641   02/05/20 0600  ceFAZolin (ANCEF) IVPB 2g/100 mL premix        2 g 200 mL/hr over 30 Minutes Intravenous To Short Stay 02/04/20 1054 02/05/20 0950   01/29/20 0600  ceFAZolin (ANCEF) IVPB 2g/100 mL premix        2 g 200 mL/hr over 30  Minutes Intravenous On call to O.R. 01/28/20 1511 01/30/20 0559   01/28/20 2000  cefTRIAXone (ROCEPHIN) 1 g in sodium chloride 0.9 % 100 mL IVPB        1 g 200 mL/hr over 30 Minutes Intravenous Every 24 hours 01/28/20 1925 02/02/20 0724   01/06/20 0900  cefTRIAXone (ROCEPHIN) 2 g in sodium chloride  0.9 % 100 mL IVPB  Status:  Discontinued        2 g 200 mL/hr over 30 Minutes Intravenous Every 12 hours 01/06/20 0105 01/06/20 2202   01/06/20 0800  vancomycin (VANCOCIN) IVPB 1000 mg/200 mL premix  Status:  Discontinued       "Followed by" Linked Group Details   1,000 mg 200 mL/hr over 60 Minutes Intravenous Every 12 hours 01/05/20 1904 01/06/20 2202   01/05/20 2000  vancomycin (VANCOREADY) IVPB 1500 mg/300 mL       "Followed by" Linked Group Details   1,500 mg 150 mL/hr over 120 Minutes Intravenous  Once 01/05/20 1904 01/06/20 0127   01/05/20 1830  cefTRIAXone (ROCEPHIN) 2 g in sodium chloride 0.9 % 100 mL IVPB        2 g 200 mL/hr over 30 Minutes Intravenous  Once 01/05/20 1829 01/05/20 2220       Time spent: 25 minutes    Junious SilkAllison Yuritza Paulhus ANP  [facility] 7 am - 330 pm/M-F for direct patient care and secure chat Please see Amion for contact information 131 days

## 2020-05-16 NOTE — Progress Notes (Signed)
Patient while walking to the restroom hit her wrist on a cabinet, due to feeling "unstable" on her feet. Sitter and writer/RN both provided standby assisted while the patient ambulated to and from the bathroom, but patient insisted she didn't need help. Tegaderm was placed on pink, non-bleeding, abrasion to left wrist.

## 2020-05-16 NOTE — Progress Notes (Signed)
CSW sent updated clinicals to [facility] for review. CSW spoke with [facility] stafff member to confirm information was received - also no beds available today.  Edwin Dada, MSW, LCSW-A Transitions of Care  Clinical Social Worker I [phone]

## 2020-05-17 LAB — URINE CULTURE: Culture: NO GROWTH

## 2020-05-17 NOTE — Progress Notes (Signed)
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  She presented to [facility] ER 3 times prior to admission.  On her 4th presentation she was admitted.  On 9/2 she was diagnosed with acute cystitis with hematuria and was discharged home on Keflex and Zofran.  9/9 she presented with anxiety and symptoms consistent with a panic attack.  She was discharged from the ER with prescription for Vistaril.  She again presented to the ER on 9/10  reporting significant anxiety and severe insomnia and stating the Vistaril was not working.  She was given Ativan while in the ER and this appeared to improve her symptoms.  She was stable for discharge and instructed to take Benadryl at bedtime to assist with her insomnia.     Subjective: May 17, 2020.  Patient seen and examined at bedside.  IVC papers signed per request of social worker.  Patient was seen by nurse practitioner May 16, 2020 Patient stated that since she has been [place] therapy and is room that her anxiety level has increased, she wants to know if she can increase her Klonopin dose.  She is currently on 1 mg every 6 hours scheduled  May 15, 2020 Patient seen and examined at bedside.  Nurse reported security had to be called because of extreme agitation patient is lying quietly in bed sleeping.  I woke her up without any incident.  She denies any complaints  May 14, 2020 Seen and examined at bedside.  She was lying quietly in bed sleeping she responded appropriately to question   Assessment/Plan: Principal Problem:   Delirium due to multiple etiologies Active Problems:   Acute metabolic encephalopathy   Hypokalemia   Polysubstance abuse (HCC)   Elevated CK   Transaminitis   Refractory  seizure (HCC)   Chronic hepatitis C without hepatic coma (HCC)   Distended abdomen   Palliative care encounter   Colonic Ileus (HCC)   Organic brain syndrome   On enteral nutrition   Physical deconditioning   Protein-calorie malnutrition (HCC)   Inadequate oral nutritional intake   Impulse disorder  1.  Urinary tract infection continue Augmentin  2.  Severe anxiety disorder/PTSD.  Patient is IVC need continue current medication with Klonopin Tegretol Depakote Lexapro  3.  History of polysubstance abuse    Code Status: Full  Severity of Illness: The appropriate patient status for this patient is INPATIENT. Inpatient status is judged to be reasonable and necessary in order to provide the required intensity of service to ensure the patient's safety. The patient's presenting symptoms, physical exam findings, and initial radiographic and laboratory data in the context of their chronic comorbidities is felt to place them at high risk for further clinical deterioration. Furthermore, it is not anticipated that the patient will be medically stable for discharge from the hospital within 2 midnights of admission. The following factors support the patient status of inpatient.   " The patient's presenting symptoms include psychiatry unstable. " The worrisome physical exam findings include unstable psychiatry symptoms. " The initial radiographic and laboratory data are worrisome because of unsafe to be discharged. " The chronic co-morbidities include psychiatric condition.   * I certify that at the point of admission it is my clinical judgment that the patient will require inpatient hospital care spanning beyond 2  midnights from the point of admission due to high intensity of service, high risk for further deterioration and high frequency of surveillance required.*    Family Communication: None  Disposition Plan: Waiting for psychiatric bed for  transfer   Consultants:  Psychiatry  Procedures:  None  Antimicrobials:  Augmentin  Metronidazole  DVT prophylaxis:     Objective: Vitals:   05/15/20 1204 05/15/20 2054 05/16/20 1419 05/16/20 2221  BP: 97/69 97/70 120/81 97/62  Pulse: 60 64 75 77  Resp: 18 16 20 18   Temp: 98.1 F (36.7 C) 98.2 F (36.8 C) 98.3 F (36.8 C) 98.3 F (36.8 C)  TempSrc: Oral Oral Oral   SpO2: 98% 99% 100% 100%  Weight:      Height:        Intake/Output Summary (Last 24 hours) at 05/17/2020 1137 Last data filed at 05/16/2020 1800 Gross per 24 hour  Intake 600 ml  Output 600 ml  Net 0 ml   Filed Weights   04/04/20 0632 04/05/20 0613 05/08/20 0500  Weight: 70.9 kg 71.6 kg 74.8 kg   Body mass index is 27.44 kg/m.  Exam:  . General: 35 y.o. year-old female well developed well nourished in no acute distress.  Alert and oriented x3. . Cardiovascular: Regular rate and rhythm with no rubs or gallops.  No thyromegaly or JVD noted.   31 Respiratory: Clear to auscultation with no wheezes or rales. Good inspiratory effort. . Abdomen: Soft nontender nondistended with normal bowel sounds x4 quadrants. . Musculoskeletal: No lower extremity edema. 2/4 pulses in all 4 extremities. . Skin: No ulcerative lesions noted or rashes, . Psychiatry: Mood is appropriate for condition and setting    Data Reviewed: CBC: No results for input(s): WBC, NEUTROABS, HGB, HCT, MCV, PLT in the last 168 hours. Basic Metabolic Panel: No results for input(s): NA, K, CL, CO2, GLUCOSE, BUN, CREATININE, CALCIUM, MG, PHOS in the last 168 hours. GFR: Estimated Creatinine Clearance: 98.4 mL/min (by C-G formula based on SCr of 0.66 mg/dL). Liver Function Tests: No results for input(s): AST, ALT, ALKPHOS, BILITOT, PROT, ALBUMIN in the last 168 hours. No results for input(s): LIPASE, AMYLASE in the last 168 hours. No results for input(s): AMMONIA in the last 168 hours. Coagulation Profile: No results for input(s):  INR, PROTIME in the last 168 hours. Cardiac Enzymes: No results for input(s): CKTOTAL, CKMB, CKMBINDEX, TROPONINI in the last 168 hours. BNP (last 3 results) No results for input(s): PROBNP in the last 8760 hours. HbA1C: No results for input(s): HGBA1C in the last 72 hours. CBG: No results for input(s): GLUCAP in the last 168 hours. Lipid Profile: No results for input(s): CHOL, HDL, LDLCALC, TRIG, CHOLHDL, LDLDIRECT in the last 72 hours. Thyroid Function Tests: No results for input(s): TSH, T4TOTAL, FREET4, T3FREE, THYROIDAB in the last 72 hours. Anemia Panel: No results for input(s): VITAMINB12, FOLATE, FERRITIN, TIBC, IRON, RETICCTPCT in the last 72 hours. Urine analysis:    Component Value Date/Time   COLORURINE AMBER (A) 04/20/2020 0505   APPEARANCEUR HAZY (A) 04/20/2020 0505   LABSPEC 1.023 04/20/2020 0505   PHURINE 5.0 04/20/2020 0505   GLUCOSEU NEGATIVE 04/20/2020 0505   HGBUR MODERATE (A) 04/20/2020 0505   BILIRUBINUR NEGATIVE 04/20/2020 0505   KETONESUR NEGATIVE 04/20/2020 0505   PROTEINUR NEGATIVE 04/20/2020 0505   NITRITE NEGATIVE 04/20/2020 0505   LEUKOCYTESUR NEGATIVE 04/20/2020 0505   Sepsis Labs: @LABRCNTIP (procalcitonin:4,lacticidven:4)  ) Recent Results (from the past 240 hour(s))  Culture, Urine     Status:  None   Collection Time: 05/16/20  9:19 AM   Specimen: Urine, Clean Catch  Result Value Ref Range Status   Specimen Description URINE, CLEAN CATCH  Final   Special Requests NONE  Final   Culture   Final    NO GROWTH Performed at [facility], [address] [place], [place] [address]    Report Status 05/17/2020 FINAL  Final      Studies: No results found.  Scheduled Meds: . amoxicillin-clavulanate  1 tablet Oral Q8H  . carbamazepine  600 mg Oral TID  . Chlorhexidine Gluconate Cloth  6 each Topical Daily  . citalopram  20 mg Oral Daily  . clonazePAM  1 mg Oral Q6H  . cloNIDine  0.1 mg Transdermal Weekly  . diclofenac Sodium  4 g  Topical QID  . docusate sodium  100 mg Oral BID  . feeding supplement  237 mL Oral BID BM  . melatonin  10 mg Oral QHS  . metroNIDAZOLE  500 mg Oral Q8H  . mirtazapine  30 mg Oral QHS  . multivitamin with minerals  1 tablet Oral Daily  . naltrexone  25 mg Oral Daily  . polyethylene glycol  17 g Oral BID  . vitamin B-6  100 mg Oral Daily  . thiamine  100 mg Oral Daily  . traZODone  150 mg Oral QHS  . ziprasidone  80 mg Oral 2 times per day    Continuous Infusions:   LOS: 132 days     Myrtie Neither, MD [facility]  To reach me or the doctor on call, go to: [email or web address] Password [email or web address]  05/17/2020, 11:37 AM

## 2020-05-17 NOTE — Progress Notes (Addendum)
Per request of Leonette Most at [facility], CSW will send additional documentation for review.  Per Caryn Bee, FNP patient remains appropriate for an inpatient psychiatric admission. On Saturday, 05/14/2020 this patient had an aggressive episode in which security was called to the room. Patient received IM Geodon for extreme agitation once on 05/14/2020, and twice on 05/15/2020 and 05/16/2020. Patient continues to remain with flat affect, flight of ideas, and has abrupt mood changes.  CSW renewed patient's IVC.  Edwin Dada, MSW, LCSW-A Transitions of Care  Clinical Social Worker I [phone]

## 2020-05-18 NOTE — Progress Notes (Signed)
CSW spoke with [facility] staff who stated there are no beds available today.  Pattrick Bady, MSW, LCSW-A Transitions of Care  Clinical Social Worker I [phone]  

## 2020-05-18 NOTE — Progress Notes (Signed)
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  She presented to [facility] ER 3 times prior to admission.  On her 4th presentation she was admitted.  On 9/2 she was diagnosed with acute cystitis with hematuria and was discharged home on Keflex and Zofran.  9/9 she presented with anxiety and symptoms consistent with a panic attack.  She was discharged from the ER with prescription for Vistaril.  She again presented to the ER on 9/10  reporting significant anxiety and severe insomnia and stating the Vistaril was not working.  She was given Ativan while in the ER and this appeared to improve her symptoms.  She was stable for discharge and instructed to take Benadryl at bedtime to assist with her insomnia.     Subjective: May 18, 2020: Patient seen and examined at bedside denies any complaints May 17, 2020.  Patient seen and examined at bedside.  IVC papers signed per request of social worker.  Patient was seen by nurse practitioner May 16, 2020 Patient stated that since she has been [place] therapy and is room that her anxiety level has increased, she wants to know if she can increase her Klonopin dose.  She is currently on 1 mg every 6 hours scheduled  May 15, 2020 Patient seen and examined at bedside.  Nurse reported security had to be called because of extreme agitation patient is lying quietly in bed sleeping.  I woke her up without any incident.  She denies any complaints  May 14, 2020 Seen and examined at bedside.  She was lying quietly in bed sleeping she responded appropriately to question   Assessment/Plan: Principal Problem:   Delirium due to multiple etiologies Active Problems:   Acute metabolic encephalopathy   Hypokalemia    Polysubstance abuse (HCC)   Elevated CK   Transaminitis   Refractory seizure (HCC)   Chronic hepatitis C without hepatic coma (HCC)   Distended abdomen   Palliative care encounter   Colonic Ileus (HCC)   Organic brain syndrome   On enteral nutrition   Physical deconditioning   Protein-calorie malnutrition (HCC)   Inadequate oral nutritional intake   Impulse disorder  1.  Urinary tract infection continue Augmentin  2.  Severe anxiety disorder/PTSD.  Patient is IVC need continue current medication with Klonopin Tegretol Depakote Lexapro  3.  History of polysubstance abuse    Code Status: Full  Severity of Illness: The appropriate patient status for this patient is INPATIENT. Inpatient status is judged to be reasonable and necessary in order to provide the required intensity of service to ensure the patient's safety. The patient's presenting symptoms, physical exam findings, and initial radiographic and laboratory data in the context of their chronic comorbidities is felt to place them at high risk for further clinical deterioration. Furthermore, it is not anticipated that the patient will be medically stable for discharge from the hospital within 37 midnights of admission. The following factors support the patient status of inpatient.   " The patient's presenting symptoms include psychiatry unstable. " The worrisome physical exam findings include unstable psychiatry symptoms. " The initial radiographic and laboratory data are worrisome because of unsafe to be discharged. " The chronic co-morbidities include psychiatric condition.   * I certify that at the point of admission it is my clinical  judgment that the patient will require inpatient hospital care spanning beyond 2 midnights from the point of admission due to high intensity of service, high risk for further deterioration and high frequency of surveillance required.*    Family Communication: None  Disposition Plan: Waiting  for psychiatric bed for transfer   Consultants:  Psychiatry  Procedures:  None  Antimicrobials:  Augmentin  Metronidazole  DVT prophylaxis:     Objective: Vitals:   05/16/20 1419 05/16/20 2221 05/17/20 1409 05/17/20 1956  BP: 120/81 97/62 109/71 120/81  Pulse: 75 77 68 76  Resp: 20 18 14 18   Temp: 98.3 F (36.8 C) 98.3 F (36.8 C) 97.8 F (36.6 C) 98 F (36.7 C)  TempSrc: Oral  Oral Oral  SpO2: 100% 100% 99% 100%  Weight:      Height:       No intake or output data in the 24 hours ending 05/18/20 1014 Filed Weights   04/04/20 0632 04/05/20 0613 05/08/20 0500  Weight: 70.9 kg 71.6 kg 74.8 kg   Body mass index is 27.44 kg/m.  Exam:  . General: 35 y.o. year-old female well developed well nourished in no acute distress.  Alert and oriented x3.  Lying quietly in bed well-groomed . Cardiovascular: Regular rate and rhythm with no rubs or gallops.  No thyromegaly or JVD noted.   31 Respiratory: Clear to auscultation with no wheezes or rales. Good inspiratory effort. . Abdomen: Soft nontender nondistended with normal bowel sounds x4 quadrants. . Musculoskeletal: No lower extremity edema. 2/4 pulses in all 4 extremities. . Skin: No ulcerative lesions noted or rashes, . Psychiatry: Mood is appropriate for condition and setting    Data Reviewed: CBC: No results for input(s): WBC, NEUTROABS, HGB, HCT, MCV, PLT in the last 168 hours. Basic Metabolic Panel: No results for input(s): NA, K, CL, CO2, GLUCOSE, BUN, CREATININE, CALCIUM, MG, PHOS in the last 168 hours. GFR: Estimated Creatinine Clearance: 98.4 mL/min (by C-G formula based on SCr of 0.66 mg/dL). Liver Function Tests: No results for input(s): AST, ALT, ALKPHOS, BILITOT, PROT, ALBUMIN in the last 168 hours. No results for input(s): LIPASE, AMYLASE in the last 168 hours. No results for input(s): AMMONIA in the last 168 hours. Coagulation Profile: No results for input(s): INR, PROTIME in the last 168  hours. Cardiac Enzymes: No results for input(s): CKTOTAL, CKMB, CKMBINDEX, TROPONINI in the last 168 hours. BNP (last 3 results) No results for input(s): PROBNP in the last 8760 hours. HbA1C: No results for input(s): HGBA1C in the last 72 hours. CBG: No results for input(s): GLUCAP in the last 168 hours. Lipid Profile: No results for input(s): CHOL, HDL, LDLCALC, TRIG, CHOLHDL, LDLDIRECT in the last 72 hours. Thyroid Function Tests: No results for input(s): TSH, T4TOTAL, FREET4, T3FREE, THYROIDAB in the last 72 hours. Anemia Panel: No results for input(s): VITAMINB12, FOLATE, FERRITIN, TIBC, IRON, RETICCTPCT in the last 72 hours. Urine analysis:    Component Value Date/Time   COLORURINE AMBER (A) 04/20/2020 0505   APPEARANCEUR HAZY (A) 04/20/2020 0505   LABSPEC 1.023 04/20/2020 0505   PHURINE 5.0 04/20/2020 0505   GLUCOSEU NEGATIVE 04/20/2020 0505   HGBUR MODERATE (A) 04/20/2020 0505   BILIRUBINUR NEGATIVE 04/20/2020 0505   KETONESUR NEGATIVE 04/20/2020 0505   PROTEINUR NEGATIVE 04/20/2020 0505   NITRITE NEGATIVE 04/20/2020 0505   LEUKOCYTESUR NEGATIVE 04/20/2020 0505   Sepsis Labs: @LABRCNTIP (procalcitonin:4,lacticidven:4)  ) Recent Results (from the past 240 hour(s))  Culture, Urine     Status: None  Collection Time: 05/16/20  9:19 AM   Specimen: Urine, Clean Catch  Result Value Ref Range Status   Specimen Description URINE, CLEAN CATCH  Final   Special Requests NONE  Final   Culture   Final    NO GROWTH Performed at [facility], [address] [place], [place] [address]    Report Status 05/17/2020 FINAL  Final      Studies: No results found.  Scheduled Meds: . carbamazepine  600 mg Oral TID  . Chlorhexidine Gluconate Cloth  6 each Topical Daily  . citalopram  20 mg Oral Daily  . clonazePAM  1 mg Oral Q6H  . cloNIDine  0.1 mg Transdermal Weekly  . diclofenac Sodium  4 g Topical QID  . docusate sodium  100 mg Oral BID  . feeding supplement  237 mL  Oral BID BM  . melatonin  10 mg Oral QHS  . mirtazapine  30 mg Oral QHS  . multivitamin with minerals  1 tablet Oral Daily  . naltrexone  25 mg Oral Daily  . polyethylene glycol  17 g Oral BID  . vitamin B-6  100 mg Oral Daily  . thiamine  100 mg Oral Daily  . traZODone  150 mg Oral QHS  . ziprasidone  80 mg Oral 2 times per day    Continuous Infusions:   LOS: 133 days     Myrtie Neither, MD [facility]  To reach me or the doctor on call, go to: [email or web address] Password [email or web address]  05/18/2020, 10:14 AM

## 2020-05-19 NOTE — Plan of Care (Signed)
  Problem: Clinical Measurements: Goal: Will remain free from infection Outcome: Progressing Goal: Cardiovascular complication will be avoided Outcome: Progressing   

## 2020-05-19 NOTE — Progress Notes (Signed)
[facility] PROGRESS NOTE  Anita Davis YNW:[id number] DOB: Jul 05, 1985 DOA: 01/04/2020 PCP: [name], PA-C    02/10/20                      02/15/20                       04/08/20   Status: Remains inpatient appropriate because:Altered mental status, Unsafe d/c plan and Inpatient level of care appropriate due to severity of illness   Dispo: The patient is from: Home              Anticipated d/c is to: SNF              Anticipated d/c date is: > 3 days              Patient currently is medically stable to d/c.  Barriers to discharge; continued high severity psychiatric illness with waxing and waning mentation with violent outburst requiring IVC and anticipated discharge long-term to state psychiatric facility. We are also pursuing [facility] at family request  Patient remains a priority admission at [facility] and [facility].  Her extremes in behavior and orientation/mentation along with violent outbursts including physically attacking staff warrants urgent transfer to inpatient psychiatric unit.    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  Patient presented to [facility], ER in [place] several times prior to admission.  Initial presentation was related to symptoms from acute cystitis.  Two other presentations related to insomnia.  On those occasions she was able to be discharged from the ER.  She returned again on 13 September being brought in by family for altered mental status.  After admission this patient developed seizure activity which  progressed to status epilepticus therefore she was transferred to [facility] for further neurological evaluation.  After several days recurrent seizures were aborted with combination of 3 antiepileptic drugs.  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  Fortunately this did not improve patient's mentation and she continued to have waxing and waning episodes of alertness and encephalopathy.  Since admission she has not had any further seizures so these medications were discontinued.  She has eventually awakened behaviors alternating between awake and appropriate, awake crying and depressed occasionally delusional, severe agitation and aggression with significant delusions.  She has been evaluated both by neurology and psychiatry who suspect a combination of sequelae from underlying brain injury as well as bipolar disorder with schizoaffective features.  During one of her alert phases patient did admit to being diagnosed with bipolar disease prior to admission.  Patient is on multiple psychotropic medications continues to have episodes of violent outbursts.  She has required frequent doses of IM Geodon as well as Recruitment consultant and frequent intervention by security staff during her episodes of severe agitation   Subjective: Awake.  Depressed affect.  Discussed directly with patient.  Responded by stating "I know all of you hate me".   Objective: Vitals:   05/18/20 2000 05/19/20 0515  BP: 110/74 101/73  Pulse: 62 60  Resp: 18 16  Temp: 98.1 F (36.7 C) 97.9 F (36.6 C)  SpO2: 98% 99%    Intake/Output Summary (Last 24 hours) at 05/19/2020 0850 Last data filed at 05/19/2020 0529 Gross per 24 hour  Intake 620 ml  Output -  Net 620 ml   Filed Weights   04/04/20 0632 04/05/20 0613 05/08/20 0500  Weight: 70.9 kg 71.6 kg 74.8 kg    Exam: General: Alert, depressed affect, no acute physical  distress. Pulmonary:  Posterior lung sounds clear, stable on room air Cardiac:  Normal heart sounds, no tachycardia, extremities warm to touch Abdomen: Soft and nontender.  Normoactive bowel sounds.  Eating 85 to 100% of meals LBM 1/26 Neurological: Moves all extremities x4, no focal neurological deficits, no tremors or rigidity/EPS sx's Psychiatric: Awake and oriented x 3.  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 During periods of alertness and lucidity patient denied use of methamphetamines prior to admission although did admit to abruptly stopping her methadone -Continue max dose Geodon 80 mg BID, naltrexone, scheduled Klonopin, Tegretol, Celexa and Remeron at hour of sleep -Continue 1:1 safety sitter and restraints as needed for aggressive behavior -In regards to her bizarre behavior she was exhibiting similar symptoms on date of admission and 24 hours previous to admission -Appreciate assistance of psych team. It appears we have maxed out on all psychotropic medications for this patient.  It is noted that many of these medications will take weeks to months for full therapeutic benefit.  Given her persistent severe behavioral swings that include violent episodes of physically attacking staff it has been determined that patient would benefit from aggressive psychiatric treatment at [facility] including evaluation for possible ECT  --Continue IM Geodon every 8-12 hours as needed severe agitation; as of 1/27  patient has not required any doses of IM Geodon since 1/24 when she required 2 doses  Abnormal urinalysis -Did not appear to be consistent with UTI more consistent with dehydration.  Unfortunately urine culture was never collected initially. -Attending physician begin empiric Augmentin on 1/19 -Follow-up urine culture after antibiotics initiated were negative.  Patient has completed 3 days of Augmentin  Severe insomnia -Continue Remeron and trazodone   History of polysubstance abuse with heroin/history of outpatient methadone treatment -Prescribed methadone 60 mg daily from [place] clinic in [place] prior to admission -Has not required methadone since arrival therefore we will continue naltrexone to treat heroin addiction in addition to impulsivity and hypersexual behaviors.  This is the last drug clinicians utilize to maintain stability after patients have been successful with methadone.  Would not return to methadone at this juncture if avoidable.    Depression and severe anxiety disorder/PTSD/no bipolar disorder now with schizoaffective features -She continues to demonstrate extremes in behavior: she was either calm and mostly appropriate but mildly confused vs extremely withdrawn sad and crying vs extremely agitated and violent.  Because of this she remains under IVC (last updated 1/19) -See above regarding pharmacotherapy    Other problems: Status epilepticus -Resolved -Suspect precipitated by abrupt withdrawal of methadone as reported by patient -Repeat MRI  on 12/28 within normal limits and it is felt behavioral issues related to brain injury sequelae and chronic psychiatric condition.  Neurology has signed off -Vimpat, Dilantin and phenobarbital have been tapered and discontinued at recommendation of neurologist  Nonobstructive transaminitis in context of hepatitis C -Elevated HCV antibody with markedly elevated HCV RNA quantitative level  consistent with chronic hepatitis C  -LFTs  remain stable without an obstructive pattern noted -11/10: Discussed with ID Dr. Manson Passey. Treatment of hepatitis C typically is initiated in the outpatient setting. Medications can be quite expensive and usually take time to obtain insurance approval.  -ID recommends scheduling outpatient appointment their clinic closer to discharge date  Recurrent constipation -Continue Colace 100 mg twice daily prn -Continue MiraLAX to twice daily   Back pain 2/2 abnormal urinalysis consistent with UTI -Patient with low-grade fever overnight and is now having back pain for days not responsive to NSAIDs and muscle relaxers -DG lumbar spine unremarkable -During hospitalization patient has been treated for pansensitive E. coli UTI as well as Salmonella UTI both of which have been sensitive to Levaquin -Completed Levaquin 12/20-urine culture obtained after antibiotics initiated and showed no growth -CT abdomen and pelvis on 12/17 unremarkable  Salmonella UTI -Completed amoxicillin  Abnormal urinalysis -Not consistent with UTI, was positive for moderate hemoglobin and greater than 50 RBCs with an elevated specific gravity -Suspect dehydration -Patient denies back pain or history of renal calculi although given her confusion unclear if this history is accurate  Physical deconditioning -Resolved -Does continue to exhibit impulsivity but maintains appropriate balance and gait no longer requires one-to-one safety sitter  Large hemorrhoids. -Resolved -Markedly improved-LD Proctofoam 12/15  Dysphagia -Resolved -Continue regular diet   Data Reviewed: Basic Metabolic Panel: No results for input(s): NA, K, CL, CO2, GLUCOSE, BUN, CREATININE, CALCIUM, MG, PHOS in the last 168 hours. Liver Function Tests: No results for input(s): AST, ALT, ALKPHOS, BILITOT, PROT, ALBUMIN in the last 168 hours. No results for input(s): LIPASE, AMYLASE in the last 168 hours. No results for input(s): AMMONIA in the  last 168 hours. CBC: No results for input(s): WBC, NEUTROABS, HGB, HCT, MCV, PLT in the last 168 hours. Cardiac Enzymes: No results for input(s): CKTOTAL, CKMB, CKMBINDEX, TROPONINI in the last 168 hours. BNP (last 3 results) No results for input(s): BNP in the last 8760 hours.  ProBNP (last 3 results) No results for input(s): PROBNP in the last 8760 hours.  CBG: No results for input(s): GLUCAP in the last 168 hours.  Recent Results (from the past 240 hour(s))  Culture, Urine     Status: None   Collection Time: 05/16/20  9:19 AM   Specimen: Urine, Clean Catch  Result Value Ref Range Status   Specimen Description URINE, CLEAN CATCH  Final   Special Requests NONE  Final   Culture   Final    NO GROWTH Performed at [facility], [address], [place], [place] [address]    Report Status 05/17/2020 FINAL  Final     Studies: No results found.  Scheduled Meds: . carbamazepine  600 mg Oral TID  . citalopram  20 mg Oral Daily  . clonazePAM  1 mg Oral Q6H  . cloNIDine  0.1 mg Transdermal Weekly  . diclofenac Sodium  4 g Topical QID  . docusate sodium  100 mg Oral BID  . feeding supplement  237 mL Oral BID BM  . melatonin  10 mg Oral QHS  . mirtazapine  30 mg Oral QHS  .  multivitamin with minerals  1 tablet Oral Daily  . naltrexone  25 mg Oral Daily  . polyethylene glycol  17 g Oral BID  . vitamin B-6  100 mg Oral Daily  . thiamine  100 mg Oral Daily  . traZODone  150 mg Oral QHS  . ziprasidone  80 mg Oral 2 times per day   Continuous Infusions:   Principal Problem:   Delirium due to multiple etiologies Active Problems:   Acute metabolic encephalopathy   Hypokalemia   Polysubstance abuse (HCC)   Elevated CK   Transaminitis   Refractory seizure (HCC)   Chronic hepatitis C without hepatic coma (HCC)   Distended abdomen   Palliative care encounter   Colonic Ileus (HCC)   Organic brain syndrome   On enteral nutrition   Physical deconditioning    Protein-calorie malnutrition (HCC)   Inadequate oral nutritional intake   Impulse disorder   Consultants:  Neurology  Psychiatry  Interventional radiology  Surgery    Procedures:  9/14 lumbar puncture  9/15 EEG  9/16 EEG  9/17 EEG  9/19 overnight EEG with video  9/22 overnight EEG with video with discontinuation of long-term EEG monitoring on 9/25  9/24 core track placement  10/6 EEG  12/15 PEG tube removed    Antibiotics: Anti-infectives (From admission, onward)   Start     Dose/Rate Route Frequency Ordered Stop   05/11/20 0845  amoxicillin-clavulanate (AUGMENTIN) 500-125 MG per tablet 500 mg        1 tablet Oral Every 8 hours 05/11/20 0750 05/18/20 0559   05/11/20 0845  metroNIDAZOLE (FLAGYL) tablet 500 mg        500 mg Oral Every 8 hours 05/11/20 0750 05/18/20 0559   04/05/20 1415  levofloxacin (LEVAQUIN) tablet 500 mg        500 mg Oral Daily 04/05/20 1327 04/11/20 0911   03/19/20 1000  metroNIDAZOLE (FLAGYL) tablet 500 mg  Status:  Discontinued        500 mg Per Tube 2 times daily 03/19/20 0822 03/23/20 0827   03/18/20 1200  amoxicillin (AMOXIL) 250 MG/5ML suspension 500 mg  Status:  Discontinued        500 mg Per Tube Every 8 hours 03/18/20 0915 03/23/20 0559   03/17/20 1200  cefTRIAXone (ROCEPHIN) 1 g in sodium chloride 0.9 % 100 mL IVPB  Status:  Discontinued        1 g 200 mL/hr over 30 Minutes Intravenous Every 24 hours 03/17/20 1048 03/18/20 0913   03/16/20 1130  metroNIDAZOLE (FLAGYL) 50 mg/ml oral suspension 500 mg  Status:  Discontinued        500 mg Per Tube 2 times daily 03/16/20 1040 03/19/20 0821   03/16/20 1130  fluconazole (DIFLUCAN) 40 MG/ML suspension 152 mg        150 mg Per Tube  Once 03/16/20 1040 03/16/20 1245   02/11/20 1526  ceFAZolin (ANCEF) IVPB 2g/100 mL premix        2 g 200 mL/hr over 30 Minutes Intravenous  Once 02/11/20 1526 02/11/20 1641   02/05/20 0600  ceFAZolin (ANCEF) IVPB 2g/100 mL premix        2 g 200 mL/hr  over 30 Minutes Intravenous To Short Stay 02/04/20 1054 02/05/20 0950   01/29/20 0600  ceFAZolin (ANCEF) IVPB 2g/100 mL premix        2 g 200 mL/hr over 30 Minutes Intravenous On call to O.R. 01/28/20 1511 01/30/20 0559   01/28/20 2000  cefTRIAXone (ROCEPHIN)  1 g in sodium chloride 0.9 % 100 mL IVPB        1 g 200 mL/hr over 30 Minutes Intravenous Every 24 hours 01/28/20 1925 02/02/20 0724   01/06/20 0900  cefTRIAXone (ROCEPHIN) 2 g in sodium chloride 0.9 % 100 mL IVPB  Status:  Discontinued        2 g 200 mL/hr over 30 Minutes Intravenous Every 12 hours 01/06/20 0105 01/06/20 2202   01/06/20 0800  vancomycin (VANCOCIN) IVPB 1000 mg/200 mL premix  Status:  Discontinued       "Followed by" Linked Group Details   1,000 mg 200 mL/hr over 60 Minutes Intravenous Every 12 hours 01/05/20 1904 01/06/20 2202   01/05/20 2000  vancomycin (VANCOREADY) IVPB 1500 mg/300 mL       "Followed by" Linked Group Details   1,500 mg 150 mL/hr over 120 Minutes Intravenous  Once 01/05/20 1904 01/06/20 0127   01/05/20 1830  cefTRIAXone (ROCEPHIN) 2 g in sodium chloride 0.9 % 100 mL IVPB        2 g 200 mL/hr over 30 Minutes Intravenous  Once 01/05/20 1829 01/05/20 2220       Time spent: 25 minutes    [name] ANP  [facility] 7 am - 330 pm/M-F for direct patient care and secure chat Please see Amion for contact information 134 days

## 2020-05-19 NOTE — Progress Notes (Signed)
CSW spoke with [facility] staff who stated there are no beds available today.  Duyen Beckom, MSW, LCSW-A Transitions of Care  Clinical Social Worker I [phone]  

## 2020-05-19 NOTE — Progress Notes (Signed)
Occupational Therapy Treatment and Discharge [name] MRN: [id number] DOB: Mar 25, 1986 Today's Date: 05/19/2020    History of present illness 35 year old with past medical history significant for hepatitis C, polysubstance abuse brought to the hospital 9/13 for disorientation which was suspected to be substance abuse.  After she became more lucid, she was discharged, but then police brought her back later in the day and they found her passed out in a parking lot.  Urine drug screen was positive for benzodiazepine and THC. An EEG done on 9/15 showed patient was in status epilepticus.  Patient was a started on Keppra.  Despite these, EEG noted continued seizures requiring additional medications as well.  Finally on 9/18, seizures broke. Follow-up EEG on 9/20 noted evidence of epilepticity from the left central temporal region.  Repeat EEG done on 9/22 noted epileptogenicity from the left central temporal region.  Pt with PEG placed on 02/05/20 removed on 12/15   OT comments  Pt is consistently performing ADL and mobility with set up to supervision. Pt has reached maximum benefit from acute OT in this setting. Recommending continued ADL and mobility with nursing staff.   Follow Up Recommendations   (Plan is for inpatient psych)    Equipment Recommendations  None recommended by OT    Recommendations for Other Services      Precautions / Restrictions Precautions Precautions: Fall Precaution Comments: seizures, Air cabin crew, has been combative       Mobility Bed Mobility Overal bed mobility: Independent                Transfers Overall transfer level: Independent Equipment used: None                  Balance Overall balance assessment: No apparent balance deficits (not formally assessed)                                         ADL either performed or assessed with clinical judgement   ADL                                          General ADL Comments: Pt has been consistently performing all ADL with set up to supervision for safety.     Vision       Perception     Praxis      Cognition Arousal/Alertness: Awake/alert Behavior During Therapy: Flat affect Overall Cognitive Status: Impaired/Different from baseline                     Current Attention Level: Selective     Safety/Judgement: Decreased awareness of safety;Decreased awareness of deficits     General Comments: pt stating the safety sitter was "the devil"        Exercises     Shoulder Instructions       General Comments      Pertinent Vitals/ Pain       Pain Assessment: No/denies pain  Home Living                                          Prior Functioning/Environment  Frequency  Min 1X/week        Progress Toward Goals  OT Goals(current goals can now be found in the care plan section)  Progress towards OT goals: Goals met/education completed, patient discharged from OT  Acute Rehab OT Goals Patient Stated Goal: none stated  Plan Discharge plan remains appropriate    Co-evaluation                 AM-PAC OT "6 Clicks" Daily Activity     Outcome Measure   Help from another person eating meals?: None Help from another person taking care of personal grooming?: None Help from another person toileting, which includes using toliet, bedpan, or urinal?: None Help from another person bathing (including washing, rinsing, drying)?: None Help from another person to put on and taking off regular upper body clothing?: None Help from another person to put on and taking off regular lower body clothing?: None 6 Click Score: 24    End of Session    OT Visit Diagnosis: Other symptoms and signs involving cognitive function   Activity Tolerance Patient tolerated treatment well   Patient Left in bed;with call bell/phone within reach;with nursing/sitter in  room   Nurse Communication          Time: 3539-1225 OT Time Calculation (min): 11 min  Charges: OT General Charges $OT Visit: 1 Visit OT Treatments $Self Care/Home Management : 8-22 mins  Nestor Lewandowsky, OTR/L Acute Rehabilitation Services Pager: [phone] Office: [phone]   Malka So 05/19/2020, 1:41 PM

## 2020-05-19 NOTE — Plan of Care (Signed)
Patient alert and oriented x3 this day. In the morning Patient was accusing this Clinical research associate of "knowing about events" and "knows where pictures of her children were located.  This Clinical research associate redirected conversation.  Sitter remains at the bedside. In the afternoon patient pleasant with this writer talking about playing cards.   Problem: Education: Goal: Knowledge of General Education information will improve Description: Including pain rating scale, medication(s)/side effects and non-pharmacologic comfort measures Outcome: Progressing   Problem: Clinical Measurements: Goal: Ability to maintain clinical measurements within normal limits will improve Outcome: Progressing Goal: Will remain free from infection Outcome: Progressing Goal: Diagnostic test results will improve Outcome: Progressing Goal: Respiratory complications will improve Outcome: Progressing Goal: Cardiovascular complication will be avoided Outcome: Progressing   Problem: Activity: Goal: Risk for activity intolerance will decrease Outcome: Progressing   Problem: Nutrition: Goal: Adequate nutrition will be maintained Outcome: Progressing   Problem: Coping: Goal: Level of anxiety will decrease Outcome: Progressing   Problem: Elimination: Goal: Will not experience complications related to bowel motility Outcome: Progressing Goal: Will not experience complications related to urinary retention Outcome: Progressing   Problem: Pain Managment: Goal: General experience of comfort will improve Outcome: Progressing   Problem: Safety: Goal: Ability to remain free from injury will improve Outcome: Progressing   Problem: Skin Integrity: Goal: Risk for impaired skin integrity will decrease Outcome: Progressing   Problem: Education: Goal: Expressions of having a comfortable level of knowledge regarding the disease process will increase Outcome: Progressing   Problem: Coping: Goal: Ability to adjust to condition or change in  health will improve Outcome: Progressing Goal: Ability to identify appropriate support needs will improve Outcome: Progressing   Problem: Health Behavior/Discharge Planning: Goal: Compliance with prescribed medication regimen will improve Outcome: Progressing   Problem: Medication: Goal: Risk for medication side effects will decrease Outcome: Progressing   Problem: Clinical Measurements: Goal: Complications related to the disease process, condition or treatment will be avoided or minimized Outcome: Progressing Goal: Diagnostic test results will improve Outcome: Progressing   Problem: Safety: Goal: Verbalization of understanding the information provided will improve Outcome: Progressing   Problem: Self-Concept: Goal: Level of anxiety will decrease Outcome: Progressing Goal: Ability to verbalize feelings about condition will improve Outcome: Progressing   Problem: Education: Goal: Ability to state activities that reduce stress will improve Outcome: Progressing   Problem: Coping: Goal: Ability to identify and develop effective coping behavior will improve Outcome: Progressing   Problem: Self-Concept: Goal: Ability to identify factors that promote anxiety will improve Outcome: Progressing Goal: Level of anxiety will decrease Outcome: Progressing Goal: Ability to modify response to factors that promote anxiety will improve Outcome: Progressing   Problem: Education: Goal: Utilization of techniques to improve thought processes will improve Outcome: Progressing Goal: Knowledge of the prescribed therapeutic regimen will improve Outcome: Progressing   Problem: Activity: Goal: Interest or engagement in leisure activities will improve Outcome: Progressing Goal: Imbalance in normal sleep/wake cycle will improve Outcome: Progressing   Problem: Coping: Goal: Coping ability will improve Outcome: Progressing Goal: Will verbalize feelings Outcome: Progressing   Problem:  Health Behavior/Discharge Planning: Goal: Ability to make decisions will improve Outcome: Progressing Goal: Compliance with therapeutic regimen will improve Outcome: Progressing   Problem: Role Relationship: Goal: Will demonstrate positive changes in social behaviors and relationships Outcome: Progressing   Problem: Safety: Goal: Ability to disclose and discuss suicidal ideas will improve Outcome: Progressing Goal: Ability to identify and utilize support systems that promote safety will improve Outcome: Progressing   Problem: Self-Concept: Goal: Will  verbalize positive feelings about self Outcome: Progressing Goal: Level of anxiety will decrease Outcome: Progressing   Problem: Education: Goal: Verbalization of understanding the information provided will improve Outcome: Progressing   Problem: Coping: Goal: Ability to demonstrate appropriate behavior when angry will improve Outcome: Progressing Goal: Ability to identify and develop effective coping behavior will improve Outcome: Progressing Goal: Ability to interact with others will improve Outcome: Progressing   Problem: Health Behavior/Discharge Planning: Goal: Identification of resources available to assist in meeting health care needs will improve Outcome: Progressing

## 2020-05-20 MED ORDER — CARBAMAZEPINE 200 MG PO TABS
800.0000 mg | ORAL_TABLET | Freq: Three times a day (TID) | ORAL | Status: DC
  Administered 2020-05-20 – 2020-06-15 (×79): 800 mg via ORAL
  Filled 2020-05-20 (×83): qty 4

## 2020-05-20 MED ORDER — CLONAZEPAM 1 MG PO TABS
1.5000 mg | ORAL_TABLET | Freq: Four times a day (QID) | ORAL | Status: DC
  Administered 2020-05-20 – 2020-05-26 (×25): 1.5 mg via ORAL
  Filled 2020-05-20 (×25): qty 1

## 2020-05-20 NOTE — Progress Notes (Signed)
[facility] PROGRESS NOTE  Anita Davis XNT:[id number] DOB: 04-Dec-1985 DOA: 01/04/2020 PCP: [name], PA-C    02/10/20                      02/15/20                       04/08/20   Status: Remains inpatient appropriate because:Altered mental status, Unsafe d/c plan and Inpatient level of care appropriate due to severity of illness   Dispo: The patient is from: Home              Anticipated d/c is to: SNF              Anticipated d/c date is: > 3 days              Patient currently is medically stable to d/c.  Barriers to discharge; continued high severity psychiatric illness with waxing and waning mentation with violent outburst requiring IVC and anticipated discharge long-term to state psychiatric facility.  Awaiting bed offer from [facility] on priority list but no beds available as of yet  Patient remains a priority admission at [facility] and [facility]  Her extremes in behavior and orientation/mentation along with violent outbursts including physically attacking staff warrants urgent transfer to inpatient psychiatric unit.    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  Patient presented to [facility], ER in [place] several times prior to admission.  Initial presentation was related to symptoms from acute cystitis.  Two other presentations related to insomnia.  On those occasions she was able to be discharged from the ER.  She returned again on 13 September being brought in by family for altered mental status.  After admission this patient  developed seizure activity which progressed to status epilepticus therefore she was transferred to [facility] for further neurological evaluation.  After several days recurrent seizures were aborted with combination of 3 antiepileptic drugs.  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  Fortunately this did not improve patient's mentation and she continued to have waxing and waning episodes of alertness and encephalopathy.  Since admission she has not had any further seizures so these medications were discontinued.  She has eventually awakened behaviors alternating between awake and appropriate, awake crying and depressed occasionally delusional, severe agitation and aggression with significant delusions.  She has been evaluated both by neurology and psychiatry who suspect a combination of sequelae from underlying brain injury as well as bipolar disorder with schizoaffective features.  During one of her alert phases patient did admit to being diagnosed with bipolar disease prior to admission.  Patient is on multiple psychotropic medications continues to have episodes of violent outbursts.  She has required frequent doses of IM Geodon as well as Recruitment consultant and frequent intervention by security staff during her episodes of severe agitation   Subjective: Sad affect laying in bed.  Tech stated that recently she was crying wanting to know where the pitchers of her children were.  When I approached her  in the room she stated "I know who you are".  I responded by telling her that I am Anita Davis and I have been taking care of her since she came to the hospital in September.  She did not further elaborate on her previous statement.  In the past when she was delusional she would state this and this would be accompanied by a paranoid response such as identifying me as the devil and the person keeping her children  from her.  She abruptly changed course during our conversation and stated "I really needs something more for my anxiety.  I am stuck in this room all of the time is very stressful."  Reinforced with her that current dose is very high try to make adjustments in medications to aid her anxiety.  She seems satisfied with this response.   Objective: Vitals:   05/19/20 1459 05/19/20 2045  BP: 110/65 115/78  Pulse: 66 69  Resp: 18 18  Temp: 98.5 F (36.9 C) (!) 97.5 F (36.4 C)  SpO2: 99% 98%    Intake/Output Summary (Last 24 hours) at 05/20/2020 0832 Last data filed at 05/20/2020 7371 Gross per 24 hour  Intake 1961 ml  Output -  Net 1961 ml   Filed Weights   04/04/20 0626 04/05/20 0613 05/08/20 0500  Weight: 70.9 kg 71.6 kg 74.8 kg    Exam: General: Alert, no acute distress, calm Pulmonary:  Bilateral lung sounds clear to auscultation, stable on room air Cardiac:  Heart sounds are normal, pulses regular, extremities warm to touch Abdomen: Nontender, normoactive bowel sounds.  LBM 1/26 Neurological: Moves all extremities x4, no focal neurological deficits, no tremors or rigidity/EPS sx's Psychiatric: Awake and oriented x 3.  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 During periods of alertness and lucidity patient denied use of methamphetamines prior to admission although did admit to abruptly stopping her methadone -Continue max dose Geodon 80 mg BID, naltrexone, scheduled Klonopin,  Tegretol, Celexa and Remeron at hour of sleep -1/28 have opted to increase Klonopin up to 1.5 mg scheduled every 6 hours and will increase Tegretol to 800 mg TID -Continue 1:1 safety sitter and restraints as needed for aggressive behavior -In regards to her bizarre behavior she was exhibiting similar symptoms on date of admission and 24 hours previous to admission -Appreciate assistance of psych team. It appears we have maxed out on all psychotropic medications for this patient.  It is noted that many of these medications will take weeks to months for full therapeutic benefit.  Given her persistent severe behavioral swings that include violent episodes of physically attacking staff it has been determined that patient would benefit from aggressive psychiatric treatment at [facility] including evaluation for possible ECT  --Continue IM Geodon every 8-12 hours as needed severe agitation; as of 1/27 patient has not required any doses of IM Geodon since 1/24 when she required 2 doses  Severe insomnia -Continue Remeron and trazodone   History of polysubstance abuse with heroin/history of outpatient methadone treatment -Prescribed methadone 60 mg daily from a clinic in [place] prior to admission -Has not required methadone  since arrival therefore we will continue naltrexone to treat heroin addiction in addition to impulsivity and hypersexual behaviors.  This is the last drug clinicians utilize to maintain stability after patients have been successful with methadone.  Would not return to methadone at this juncture if avoidable.    Depression and severe anxiety disorder/PTSD/no bipolar disorder now with schizoaffective features -She continues to demonstrate extremes in behavior: she was either calm and mostly appropriate but mildly confused vs extremely withdrawn sad and crying vs extremely agitated and violent.  Because of this she remains under IVC (last updated 1/19) -See above regarding  pharmacotherapy    Other problems: Status epilepticus -Resolved -Suspect precipitated by abrupt withdrawal of methadone as reported by patient -Repeat MRI on 12/28 within normal limits and it is felt behavioral issues related to brain injury sequelae and chronic psychiatric condition.  Neurology has signed off -Vimpat, Dilantin and phenobarbital have been tapered and discontinued at recommendation of neurologist  Nonobstructive transaminitis in context of hepatitis C -Elevated HCV antibody with markedly elevated HCV RNA quantitative level  consistent with chronic hepatitis C  -LFTs remain stable without an obstructive pattern noted -11/10: Discussed with ID Dr. Manson PasseyMandahar. Treatment of hepatitis C typically is initiated in the outpatient setting. Medications can be quite expensive and usually take time to obtain insurance approval.  -ID recommends scheduling outpatient appointment their clinic closer to discharge date  Recurrent constipation -Continue Colace 100 mg twice daily prn -Continue MiraLAX to twice daily   Back pain 2/2 abnormal urinalysis consistent with UTI -Patient with low-grade fever overnight and is now having back pain for days not responsive to NSAIDs and muscle relaxers -DG lumbar spine unremarkable -During hospitalization patient has been treated for pansensitive E. coli UTI as well as Salmonella UTI both of which have been sensitive to Levaquin -Completed Levaquin 12/20-urine culture obtained after antibiotics initiated and showed no growth -CT abdomen and pelvis on 12/17 unremarkable  Salmonella UTI -Completed amoxicillin  Abnormal urinalysis -Not consistent with UTI, was positive for moderate hemoglobin and greater than 50 RBCs with an elevated specific gravity -Suspect dehydration -Patient denies back pain or history of renal calculi although given her confusion unclear if this history is accurate  Physical deconditioning -Resolved -Does continue to  exhibit impulsivity but maintains appropriate balance and gait no longer requires one-to-one safety sitter  Large hemorrhoids. -Resolved -Markedly improved-LD Proctofoam 12/15  Dysphagia -Resolved -Continue regular diet   Data Reviewed: Basic Metabolic Panel: No results for input(s): NA, K, CL, CO2, GLUCOSE, BUN, CREATININE, CALCIUM, MG, PHOS in the last 168 hours. Liver Function Tests: No results for input(s): AST, ALT, ALKPHOS, BILITOT, PROT, ALBUMIN in the last 168 hours. No results for input(s): LIPASE, AMYLASE in the last 168 hours. No results for input(s): AMMONIA in the last 168 hours. CBC: No results for input(s): WBC, NEUTROABS, HGB, HCT, MCV, PLT in the last 168 hours. Cardiac Enzymes: No results for input(s): CKTOTAL, CKMB, CKMBINDEX, TROPONINI in the last 168 hours. BNP (last 3 results) No results for input(s): BNP in the last 8760 hours.  ProBNP (last 3 results) No results for input(s): PROBNP in the last 8760 hours.  CBG: No results for input(s): GLUCAP in the last 168 hours.  Recent Results (from the past 240 hour(s))  Culture, Urine     Status: None   Collection Time: 05/16/20  9:19 AM   Specimen: Urine, Clean Catch  Result Value Ref Range Status   Specimen Description URINE, CLEAN CATCH  Final  Special Requests NONE  Final   Culture   Final    NO GROWTH Performed at [facility], [address] [place], [place] [address]    Report Status 05/17/2020 FINAL  Final     Studies: No results found.  Scheduled Meds: . carbamazepine  600 mg Oral TID  . citalopram  20 mg Oral Daily  . clonazePAM  1 mg Oral Q6H  . cloNIDine  0.1 mg Transdermal Weekly  . diclofenac Sodium  4 g Topical QID  . docusate sodium  100 mg Oral BID  . feeding supplement  237 mL Oral BID BM  . melatonin  10 mg Oral QHS  . mirtazapine  30 mg Oral QHS  . multivitamin with minerals  1 tablet Oral Daily  . naltrexone  25 mg Oral Daily  . polyethylene glycol  17 g Oral BID   . vitamin B-6  100 mg Oral Daily  . thiamine  100 mg Oral Daily  . traZODone  150 mg Oral QHS  . ziprasidone  80 mg Oral 2 times per day   Continuous Infusions:   Principal Problem:   Delirium due to multiple etiologies Active Problems:   Acute metabolic encephalopathy   Hypokalemia   Polysubstance abuse (HCC)   Elevated CK   Transaminitis   Refractory seizure (HCC)   Chronic hepatitis C without hepatic coma (HCC)   Distended abdomen   Palliative care encounter   Colonic Ileus (HCC)   Organic brain syndrome   On enteral nutrition   Physical deconditioning   Protein-calorie malnutrition (HCC)   Inadequate oral nutritional intake   Impulse disorder   Consultants:  Neurology  Psychiatry  Interventional radiology  Surgery    Procedures:  9/14 lumbar puncture  9/15 EEG  9/16 EEG  9/17 EEG  9/19 overnight EEG with video  9/22 overnight EEG with video with discontinuation of long-term EEG monitoring on 9/25  9/24 core track placement  10/6 EEG  12/15 PEG tube removed    Antibiotics: Anti-infectives (From admission, onward)   Start     Dose/Rate Route Frequency Ordered Stop   05/11/20 0845  amoxicillin-clavulanate (AUGMENTIN) 500-125 MG per tablet 500 mg        1 tablet Oral Every 8 hours 05/11/20 0750 05/18/20 0559   05/11/20 0845  metroNIDAZOLE (FLAGYL) tablet 500 mg        500 mg Oral Every 8 hours 05/11/20 0750 05/18/20 0559   04/05/20 1415  levofloxacin (LEVAQUIN) tablet 500 mg        500 mg Oral Daily 04/05/20 1327 04/11/20 0911   03/19/20 1000  metroNIDAZOLE (FLAGYL) tablet 500 mg  Status:  Discontinued        500 mg Per Tube 2 times daily 03/19/20 0822 03/23/20 0827   03/18/20 1200  amoxicillin (AMOXIL) 250 MG/5ML suspension 500 mg  Status:  Discontinued        500 mg Per Tube Every 8 hours 03/18/20 0915 03/23/20 0559   03/17/20 1200  cefTRIAXone (ROCEPHIN) 1 g in sodium chloride 0.9 % 100 mL IVPB  Status:  Discontinued        1 g 200  mL/hr over 30 Minutes Intravenous Every 24 hours 03/17/20 1048 03/18/20 0913   03/16/20 1130  metroNIDAZOLE (FLAGYL) 50 mg/ml oral suspension 500 mg  Status:  Discontinued        500 mg Per Tube 2 times daily 03/16/20 1040 03/19/20 0821   03/16/20 1130  fluconazole (DIFLUCAN) 40 MG/ML  suspension 152 mg        150 mg Per Tube  Once 03/16/20 1040 03/16/20 1245   02/11/20 1526  ceFAZolin (ANCEF) IVPB 2g/100 mL premix        2 g 200 mL/hr over 30 Minutes Intravenous  Once 02/11/20 1526 02/11/20 1641   02/05/20 0600  ceFAZolin (ANCEF) IVPB 2g/100 mL premix        2 g 200 mL/hr over 30 Minutes Intravenous To Short Stay 02/04/20 1054 02/05/20 0950   01/29/20 0600  ceFAZolin (ANCEF) IVPB 2g/100 mL premix        2 g 200 mL/hr over 30 Minutes Intravenous On call to O.R. 01/28/20 1511 01/30/20 0559   01/28/20 2000  cefTRIAXone (ROCEPHIN) 1 g in sodium chloride 0.9 % 100 mL IVPB        1 g 200 mL/hr over 30 Minutes Intravenous Every 24 hours 01/28/20 1925 02/02/20 0724   01/06/20 0900  cefTRIAXone (ROCEPHIN) 2 g in sodium chloride 0.9 % 100 mL IVPB  Status:  Discontinued        2 g 200 mL/hr over 30 Minutes Intravenous Every 12 hours 01/06/20 0105 01/06/20 2202   01/06/20 0800  vancomycin (VANCOCIN) IVPB 1000 mg/200 mL premix  Status:  Discontinued       "Followed by" Linked Group Details   1,000 mg 200 mL/hr over 60 Minutes Intravenous Every 12 hours 01/05/20 1904 01/06/20 2202   01/05/20 2000  vancomycin (VANCOREADY) IVPB 1500 mg/300 mL       "Followed by" Linked Group Details   1,500 mg 150 mL/hr over 120 Minutes Intravenous  Once 01/05/20 1904 01/06/20 0127   01/05/20 1830  cefTRIAXone (ROCEPHIN) 2 g in sodium chloride 0.9 % 100 mL IVPB        2 g 200 mL/hr over 30 Minutes Intravenous  Once 01/05/20 1829 01/05/20 2220       Time spent: 25 minutes    [name] ANP  [facility] 7 am - 330 pm/M-F for direct patient care and secure chat Please see Amion for contact  information 135 days

## 2020-05-20 NOTE — Progress Notes (Signed)
CSW spoke with [facility] staff who stated there are no beds available today.  Adean Milosevic, MSW, LCSW-A Transitions of Care  Clinical Social Worker I [phone]  

## 2020-05-21 LAB — COMPREHENSIVE METABOLIC PANEL
ALT: 63 U/L — ABNORMAL HIGH (ref 0–44)
AST: 70 U/L — ABNORMAL HIGH (ref 15–41)
Albumin: 3.4 g/dL — ABNORMAL LOW (ref 3.5–5.0)
Alkaline Phosphatase: 111 U/L (ref 38–126)
Anion gap: 10 (ref 5–15)
BUN: 14 mg/dL (ref 6–20)
CO2: 26 mmol/L (ref 22–32)
Calcium: 8.3 mg/dL — ABNORMAL LOW (ref 8.9–10.3)
Chloride: 98 mmol/L (ref 98–111)
Creatinine, Ser: 0.65 mg/dL (ref 0.44–1.00)
GFR, Estimated: >60 mL/min (ref >60)
Glucose, Bld: 150 mg/dL — ABNORMAL HIGH (ref 70–99)
Potassium: 4.1 mmol/L (ref 3.5–5.1)
Sodium: 134 mmol/L — ABNORMAL LOW (ref 135–145)
Total Bilirubin: 0.5 mg/dL (ref 0.3–1.2)
Total Protein: 6.2 g/dL — ABNORMAL LOW (ref 6.5–8.1)

## 2020-05-21 NOTE — Progress Notes (Signed)
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  She presented to [facility] ER 3 times prior to admission.  On her 4th presentation she was admitted.  On 9/2 she was diagnosed with acute cystitis with hematuria and was discharged home on Keflex and Zofran.  9/9 she presented with anxiety and symptoms consistent with a panic attack.  She was discharged from the ER with prescription for Vistaril.  She again presented to the ER on 9/10  reporting significant anxiety and severe insomnia and stating the Vistaril was not working.  She was given Ativan while in the ER and this appeared to improve her symptoms.  She was stable for discharge and instructed to take Benadryl at bedtime to assist with her insomnia.     Subjective: May 21, 2020: Patient seen and examined at bedside.  She says she did not feel that good when I asked her why she kept quiet for a while and then she said she needed her anxiety medicine.  I reminded her she  has Klonopin which was recently increased to 1.5 mg every 6 hours scheduled.    May 18, 2020: Patient seen and examined at bedside denies any complaints May 17, 2020.  Patient seen and examined at bedside.  IVC papers signed per request of social worker.  Patient was seen by nurse practitioner May 16, 2020 Patient stated that since she has been [place] therapy and is room that her anxiety level has increased, she wants to know if she can increase her Klonopin dose.  She is currently on 1 mg every 6 hours scheduled  May 15, 2020 Patient seen and examined at bedside.  Nurse reported security had to be called because of extreme agitation patient is lying quietly in bed sleeping.  I woke her up without any incident.  She denies  any complaints  May 14, 2020 Seen and examined at bedside.  She was lying quietly in bed sleeping she responded appropriately to question   Assessment/Plan: Principal Problem:   Delirium due to multiple etiologies Active Problems:   Acute metabolic encephalopathy   Hypokalemia   Polysubstance abuse (HCC)   Elevated CK   Transaminitis   Refractory seizure (HCC)   Chronic hepatitis C without hepatic coma (HCC)   Distended abdomen   Palliative care encounter   Colonic Ileus (HCC)   Organic brain syndrome   On enteral nutrition   Physical deconditioning   Protein-calorie malnutrition (HCC)   Inadequate oral nutritional intake   Impulse disorder  1.  Urinary tract infection continue Augmentin  2.  Severe anxiety disorder/PTSD.  Patient is IVC need continue current medication with Klonopin Tegretol Depakote Lexapro  3.  History of polysubstance abuse    Code Status: Full  Severity of Illness: The appropriate patient status for this patient is INPATIENT. Inpatient status is judged to be reasonable and necessary in order to provide the required intensity of service to ensure the patient's safety. The patient's presenting symptoms, physical exam findings, and initial radiographic and laboratory data in the context of their chronic comorbidities is felt to place them at high risk for further clinical deterioration. Furthermore, it is not anticipated that the patient will be medically stable for discharge from the hospital within 2 midnights of admission. The following factors support the patient  status of inpatient.   " The patient's presenting symptoms include psychiatry unstable. " The worrisome physical exam findings include unstable psychiatry symptoms. " The initial radiographic and laboratory data are worrisome because of unsafe to be discharged. " The chronic co-morbidities include psychiatric condition.   * I certify that at the point of admission it is my clinical  judgment that the patient will require inpatient hospital care spanning beyond 2 midnights from the point of admission due to high intensity of service, high risk for further deterioration and high frequency of surveillance required.*    Family Communication: None  Disposition Plan: Waiting for psychiatric bed for transfer   Consultants:  Psychiatry  Procedures:  None  Antimicrobials:  Augmentin  Metronidazole  DVT prophylaxis:     Objective: Vitals:   05/20/20 0602 05/20/20 1544 05/20/20 2230 05/21/20 0548  BP: 105/77 109/65 110/68 112/77  Pulse: (!) 55 72 70 68  Resp: 16 17 16 18   Temp: 97.6 F (36.4 C) 98.6 F (37 C) 98.2 F (36.8 C) 98.4 F (36.9 C)  TempSrc: Oral Oral Oral Oral  SpO2: 100% 98% 98% 98%  Weight:    73.7 kg  Height:        Intake/Output Summary (Last 24 hours) at 05/21/2020 1534 Last data filed at 05/21/2020 0100 Gross per 24 hour  Intake 540 ml  Output --  Net 540 ml   Filed Weights   04/05/20 0613 05/08/20 0500 05/21/20 0548  Weight: 71.6 kg 74.8 kg 73.7 kg   Body mass index is 27.03 kg/m.  Exam:  . General: 35 y.o. year-old female well developed well nourished in no acute distress.  Alert and oriented x3.  Lying quietly in bed well-groomed . Cardiovascular: Regular rate and rhythm with no rubs or gallops.  No thyromegaly or JVD noted.   31 Respiratory: Clear to auscultation with no wheezes or rales. Good inspiratory effort. . Abdomen: Soft nontender nondistended with normal bowel sounds x4 quadrants. . Musculoskeletal: No lower extremity edema. 2/4 pulses in all 4 extremities. . Skin: No ulcerative lesions noted or rashes, . Psychiatry: Mood is appropriate for condition and setting    Data Reviewed: CBC: No results for input(s): WBC, NEUTROABS, HGB, HCT, MCV, PLT in the last 168 hours. Basic Metabolic Panel: Recent Labs  Lab 05/21/20 0121  NA 134*  K 4.1  CL 98  CO2 26  GLUCOSE 150*  BUN 14  CREATININE 0.65  CALCIUM  8.3*   GFR: Estimated Creatinine Clearance: 97.8 mL/min (by C-G formula based on SCr of 0.65 mg/dL). Liver Function Tests: Recent Labs  Lab 05/21/20 0121  AST 70*  ALT 63*  ALKPHOS 111  BILITOT 0.5  PROT 6.2*  ALBUMIN 3.4*   No results for input(s): LIPASE, AMYLASE in the last 168 hours. No results for input(s): AMMONIA in the last 168 hours. Coagulation Profile: No results for input(s): INR, PROTIME in the last 168 hours. Cardiac Enzymes: No results for input(s): CKTOTAL, CKMB, CKMBINDEX, TROPONINI in the last 168 hours. BNP (last 3 results) No results for input(s): PROBNP in the last 8760 hours. HbA1C: No results for input(s): HGBA1C in the last 72 hours. CBG: No results for input(s): GLUCAP in the last 168 hours. Lipid Profile: No results for input(s): CHOL, HDL, LDLCALC, TRIG, CHOLHDL, LDLDIRECT in the last 72 hours. Thyroid Function Tests: No results for input(s): TSH, T4TOTAL, FREET4, T3FREE, THYROIDAB in the last 72 hours. Anemia Panel: No results for input(s): VITAMINB12, FOLATE, FERRITIN, TIBC, IRON, RETICCTPCT in  the last 72 hours. Urine analysis:    Component Value Date/Time   COLORURINE AMBER (A) 04/20/2020 0505   APPEARANCEUR HAZY (A) 04/20/2020 0505   LABSPEC 1.023 04/20/2020 0505   PHURINE 5.0 04/20/2020 0505   GLUCOSEU NEGATIVE 04/20/2020 0505   HGBUR MODERATE (A) 04/20/2020 0505   BILIRUBINUR NEGATIVE 04/20/2020 0505   KETONESUR NEGATIVE 04/20/2020 0505   PROTEINUR NEGATIVE 04/20/2020 0505   NITRITE NEGATIVE 04/20/2020 0505   LEUKOCYTESUR NEGATIVE 04/20/2020 0505   Sepsis Labs: @LABRCNTIP (procalcitonin:4,lacticidven:4)  ) Recent Results (from the past 240 hour(s))  Culture, Urine     Status: None   Collection Time: 05/16/20  9:19 AM   Specimen: Urine, Clean Catch  Result Value Ref Range Status   Specimen Description URINE, CLEAN CATCH  Final   Special Requests NONE  Final   Culture   Final    NO GROWTH Performed at [facility],  [address], [place], [place]    Report Status 05/17/2020 FINAL  Final      Studies: No results found.  Scheduled Meds: . carbamazepine  800 mg Oral TID  . citalopram  20 mg Oral Daily  . clonazePAM  1.5 mg Oral Q6H  . cloNIDine  0.1 mg Transdermal Weekly  . diclofenac Sodium  4 g Topical QID  . docusate sodium  100 mg Oral BID  . feeding supplement  237 mL Oral BID BM  . melatonin  10 mg Oral QHS  . mirtazapine  30 mg Oral QHS  . multivitamin with minerals  1 tablet Oral Daily  . naltrexone  25 mg Oral Daily  . polyethylene glycol  17 g Oral BID  . vitamin B-6  100 mg Oral Daily  . thiamine  100 mg Oral Daily  . traZODone  150 mg Oral QHS  . ziprasidone  80 mg Oral 2 times per day    Continuous Infusions:   LOS: 136 days     05/19/2020, MD [facility]  To reach me or the doctor on call, go to: [email or web address] Password [facility]  05/21/2020, 3:34 PM

## 2020-05-22 NOTE — Progress Notes (Signed)
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  She presented to [facility] ER 3 times prior to admission.  On her 4th presentation she was admitted.  On 9/2 she was diagnosed with acute cystitis with hematuria and was discharged home on Keflex and Zofran.  9/9 she presented with anxiety and symptoms consistent with a panic attack.  She was discharged from the ER with prescription for Vistaril.  She again presented to the ER on 9/10  reporting significant anxiety and severe insomnia and stating the Vistaril was not working.  She was given Ativan while in the ER and this appeared to improve her symptoms.  She was stable for discharge and instructed to take Benadryl at bedtime to assist with her insomnia.     Subjective: May 22, 2020: Patient seen and examined at bedside sitter is at bedside denies any complaints today.  May 21, 2020: Patient seen and examined at bedside.  She says she did not feel that good when I asked her why she kept quiet for a while and then she said she needed her anxiety medicine.  I reminded her she  has Klonopin which was recently increased to 1.5 mg every 6 hours scheduled.    May 18, 2020: Patient seen and examined at bedside denies any complaints May 17, 2020.  Patient seen and examined at bedside.  IVC papers signed per request of social worker.  Patient was seen by nurse practitioner May 16, 2020 Patient stated that since she has been [place] therapy and is room that her anxiety level has increased, she wants to know if she can increase her Klonopin dose.  She is currently on 1 mg every 6 hours scheduled  May 15, 2020 Patient seen and examined at bedside.  Nurse reported security had to be called because of  extreme agitation patient is lying quietly in bed sleeping.  I woke her up without any incident.  She denies any complaints  May 14, 2020 Seen and examined at bedside.  She was lying quietly in bed sleeping she responded appropriately to question   Assessment/Plan: Principal Problem:   Delirium due to multiple etiologies Active Problems:   Acute metabolic encephalopathy   Hypokalemia   Polysubstance abuse (HCC)   Elevated CK   Transaminitis   Refractory seizure (HCC)   Chronic hepatitis C without hepatic coma (HCC)   Distended abdomen   Palliative care encounter   Colonic Ileus (HCC)   Organic brain syndrome   On enteral nutrition   Physical deconditioning   Protein-calorie malnutrition (HCC)   Inadequate oral nutritional intake   Impulse disorder  1.  Urinary tract infection continue Augmentin  2.  Severe anxiety disorder/PTSD.  Patient is IVC need continue current medication with Klonopin Tegretol Depakote Lexapro  3.  History of polysubstance abuse    Code Status: Full  Severity of Illness: The appropriate patient status for this patient is INPATIENT. Inpatient status is judged to be reasonable and necessary in order to provide the required intensity of service to ensure the patient's safety. The patient's presenting symptoms, physical exam findings, and initial radiographic and laboratory data in the context of their chronic comorbidities is felt to place them at high risk for further clinical deterioration. Furthermore, it is not anticipated that the patient will be  medically stable for discharge from the hospital within 2 midnights of admission. The following factors support the patient status of inpatient.   " The patient's presenting symptoms include psychiatry unstable. " The worrisome physical exam findings include unstable psychiatry symptoms. " The initial radiographic and laboratory data are worrisome because of unsafe to be discharged. " The chronic  co-morbidities include psychiatric condition.   * I certify that at the point of admission it is my clinical judgment that the patient will require inpatient hospital care spanning beyond 2 midnights from the point of admission due to high intensity of service, high risk for further deterioration and high frequency of surveillance required.*    Family Communication: None  Disposition Plan: Waiting for psychiatric bed for transfer   Consultants:  Psychiatry  Procedures:  None  Antimicrobials:  Augmentin  Metronidazole  DVT prophylaxis:     Objective: Vitals:   05/21/20 1544 05/21/20 2140 05/22/20 0515 05/22/20 1417  BP: 104/71 100/88 99/68 106/66  Pulse: 68 62 66 63  Resp: 16 18 16 16   Temp: 97.6 F (36.4 C) 98.4 F (36.9 C) 98.3 F (36.8 C) 97.9 F (36.6 C)  TempSrc: Oral Oral Oral Oral  SpO2: 98% 98% 99% 99%  Weight:   73.6 kg   Height:        Intake/Output Summary (Last 24 hours) at 05/22/2020 1444 Last data filed at 05/22/2020 1330 Gross per 24 hour  Intake 360 ml  Output --  Net 360 ml   Filed Weights   05/08/20 0500 05/21/20 0548 05/22/20 0515  Weight: 74.8 kg 73.7 kg 73.6 kg   Body mass index is 26.99 kg/m.  Exam:  . General: 35 y.o. year-old female well developed well nourished in no acute distress.  Alert and oriented x3.  Lying quietly in bed well-groomed . Cardiovascular: Regular rate and rhythm with no rubs or gallops.  No thyromegaly or JVD noted.   31 Respiratory: Clear to auscultation with no wheezes or rales. Good inspiratory effort. . Abdomen: Soft nontender nondistended with normal bowel sounds x4 quadrants. . Musculoskeletal: No lower extremity edema. 2/4 pulses in all 4 extremities. . Skin: No ulcerative lesions noted or rashes, . Psychiatry: Mood is appropriate for condition and setting    Data Reviewed: CBC: No results for input(s): WBC, NEUTROABS, HGB, HCT, MCV, PLT in the last 168 hours. Basic Metabolic Panel: Recent Labs   Lab 05/21/20 0121  NA 134*  K 4.1  CL 98  CO2 26  GLUCOSE 150*  BUN 14  CREATININE 0.65  CALCIUM 8.3*   GFR: Estimated Creatinine Clearance: 97.6 mL/min (by C-G formula based on SCr of 0.65 mg/dL). Liver Function Tests: Recent Labs  Lab 05/21/20 0121  AST 70*  ALT 63*  ALKPHOS 111  BILITOT 0.5  PROT 6.2*  ALBUMIN 3.4*   No results for input(s): LIPASE, AMYLASE in the last 168 hours. No results for input(s): AMMONIA in the last 168 hours. Coagulation Profile: No results for input(s): INR, PROTIME in the last 168 hours. Cardiac Enzymes: No results for input(s): CKTOTAL, CKMB, CKMBINDEX, TROPONINI in the last 168 hours. BNP (last 3 results) No results for input(s): PROBNP in the last 8760 hours. HbA1C: No results for input(s): HGBA1C in the last 72 hours. CBG: No results for input(s): GLUCAP in the last 168 hours. Lipid Profile: No results for input(s): CHOL, HDL, LDLCALC, TRIG, CHOLHDL, LDLDIRECT in the last 72 hours. Thyroid Function Tests: No results for input(s): TSH, T4TOTAL, FREET4, T3FREE, THYROIDAB in  the last 72 hours. Anemia Panel: No results for input(s): VITAMINB12, FOLATE, FERRITIN, TIBC, IRON, RETICCTPCT in the last 72 hours. Urine analysis:    Component Value Date/Time   COLORURINE AMBER (A) 04/20/2020 0505   APPEARANCEUR HAZY (A) 04/20/2020 0505   LABSPEC 1.023 04/20/2020 0505   PHURINE 5.0 04/20/2020 0505   GLUCOSEU NEGATIVE 04/20/2020 0505   HGBUR MODERATE (A) 04/20/2020 0505   BILIRUBINUR NEGATIVE 04/20/2020 0505   KETONESUR NEGATIVE 04/20/2020 0505   PROTEINUR NEGATIVE 04/20/2020 0505   NITRITE NEGATIVE 04/20/2020 0505   LEUKOCYTESUR NEGATIVE 04/20/2020 0505   Sepsis Labs: @LABRCNTIP (procalcitonin:4,lacticidven:4)  ) Recent Results (from the past 240 hour(s))  Culture, Urine     Status: None   Collection Time: 05/16/20  9:19 AM   Specimen: Urine, Clean Catch  Result Value Ref Range Status   Specimen Description URINE, CLEAN CATCH   Final   Special Requests NONE  Final   Culture   Final    NO GROWTH Performed at [facility], [address], [place], [place]    Report Status 05/17/2020 FINAL  Final      Studies: No results found.  Scheduled Meds: . carbamazepine  800 mg Oral TID  . citalopram  20 mg Oral Daily  . clonazePAM  1.5 mg Oral Q6H  . cloNIDine  0.1 mg Transdermal Weekly  . diclofenac Sodium  4 g Topical QID  . docusate sodium  100 mg Oral BID  . feeding supplement  237 mL Oral BID BM  . melatonin  10 mg Oral QHS  . mirtazapine  30 mg Oral QHS  . multivitamin with minerals  1 tablet Oral Daily  . naltrexone  25 mg Oral Daily  . polyethylene glycol  17 g Oral BID  . vitamin B-6  100 mg Oral Daily  . thiamine  100 mg Oral Daily  . traZODone  150 mg Oral QHS  . ziprasidone  80 mg Oral 2 times per day    Continuous Infusions:   LOS: 137 days     05/19/2020, MD [facility]  To reach me or the doctor on call, go to: [email or web address] Password [facility]  05/22/2020, 2:44 PM

## 2020-05-23 NOTE — Progress Notes (Addendum)
2pm: CSW was notified by Dr. Neale Burly at [facility] that this patient is not a good candidate for Electric Convulsion Therapy as she has an underlying neurological condition. Dr. Neale Burly recommends the patient remain on [facility] waiting list.  9:30am: CSW spoke with [facility] staff who stated there are no beds available today.  CSW spoke with Pattricia Boss at disposition regarding a referral for this patient to [facility] - Pattricia Boss to add patient to review list and clinicals were sent via HUB.  Edwin Dada, MSW, LCSW-A Transitions of Care  Clinical Social Worker I [phone]

## 2020-05-23 NOTE — Progress Notes (Signed)
Pt had a great night. No negative behaviors, or negative outbursts. Pt very pleasant with this NT the entire shift. Pt conversed with this NT from 05:30 until 07:00

## 2020-05-23 NOTE — Progress Notes (Addendum)
[facility] PROGRESS NOTE  Anita Davis ZOX:[id number] DOB: 08/15/1985 DOA: 01/04/2020 PCP: Jacquelin HawkingMcElroy, Shannon, PA-C    02/10/20                      02/15/20                       04/08/20   Status: Remains inpatient appropriate because:Altered mental status, Unsafe d/c plan and Inpatient level of care appropriate due to severity of illness   Dispo: The patient is from: Home              Anticipated d/c is to: SNF              Anticipated d/c date is: > 3 days              Patient currently is medically stable to d/c.  Barriers to discharge: Significant aggressive and violent outbursts have improved w/ no episodes sine 1/24. Remains on priority list for [facility]. [facility] re contacted for admit given improved behaviors-this facility is ECT capable  **1/31: [facility] and given patient's underlying neurological condition i.e. previous history of status epilepticus she is not an appropriate candidate for ECT.  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  Patient [facility] in [place] several times prior to admission.  Initial presentation was related to symptoms from acute cystitis.  Two other presentations related to insomnia.  On those occasions she was able to be discharged from the ER.  She returned again on 13 September being brought in by family for altered mental status.  After admission this patient developed seizure activity which progressed to status epilepticus therefore she was transferred to [facility]  for further neurological evaluation.  After several days recurrent seizures were aborted with combination of 3 antiepileptic drugs.  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  Fortunately this did not improve patient's mentation and she continued to have waxing and waning episodes of alertness and encephalopathy.  Since admission she has not had any further seizures so these medications were discontinued.  She has eventually awakened behaviors alternating between awake and appropriate, awake crying and depressed occasionally delusional, severe agitation and aggression with significant delusions.  She has been evaluated both by neurology and psychiatry who suspect a combination of sequelae from underlying brain injury as well as bipolar disorder with schizoaffective features.  During one of her alert phases patient did admit to being diagnosed with bipolar disease prior to admission.  Patient is on multiple psychotropic medications continued to have episodes of violent outbursts.  She had required frequent doses of IM Geodon as well as Recruitment consultantsafety sitter and frequent intervention by security staff during her episodes of severe agitation.  Fortunately, as of 1/31 patient has had more consistent behavior without any significant violent or aggressive outburst that would require dosing with IM Geodon since 1 24.  She would benefit from inpatient psych with ECT evaluation as an option given the severity  of her underlying psychiatric condition.   Subjective: Awakened.  Sleepy.  Appears to be sad but states she is doing well.  Noted that patient had a notebook in her bed where she has made a list of several items that she needs brought to the hospital.   Objective: Vitals:   05/23/20 0438 05/23/20 0440  BP:  100/65  Pulse:  63  Resp:  18  Temp: 97.9 F (36.6 C) 97.9 F (36.6 C)  SpO2:  99%     Intake/Output Summary (Last 24 hours) at 05/23/2020 0786 Last data filed at 05/23/2020 0600 Gross per 24 hour  Intake 360 ml  Output --  Net 360 ml   Filed Weights   05/21/20 0548 05/22/20 0515 05/23/20 0445  Weight: 73.7 kg 73.6 kg 78.9 kg    Exam: General: Calm, awakened, no acute distress Pulmonary:  Bilateral lung sounds clear to auscultation, stable on room air Cardiac:  Normal heart sounds, no resting tachycardia, extremities warm to touch Abdomen: Soft nontender nondistended, tolerating solid diet.  Normoactive bowel sounds.  LBM 1/29 Neurological: Moves all extremities x4, no focal neurological deficits, no tremors or rigidity/EPS sx's Psychiatric: Awake and oriented x 3.  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 During periods of alertness and lucidity patient denied use of methamphetamines prior to admission although did admit to abruptly stopping her methadone -Continue max dose Geodon 80 mg BID, naltrexone, scheduled Klonopin, Tegretol, Celexa and Remeron at hour of sleep -1/28 increased Klonopin up to 1.5 mg scheduled every 6 hours and increased Tegretol to 800 mg TID as of 1/29 LFTs remain stable with AST 70, ALT 63 with normal total bilirubin -Continue 1:1 safety sitter  -In regards to her bizarre behavior she was exhibiting similar symptoms on date of admission and 24 hours previous to admission -Appreciate assistance of psych team. It appears we have maxed out on all  psychotropic medications for this patient.  It is noted that many of these medications will take weeks to months for full therapeutic benefit.  Given her persistent severe behavioral swings that include violent episodes of physically attacking staff it has been determined that patient would benefit from aggressive psychiatric treatment at [facility] including evaluation for possible ECT  --Continue IM Geodon every 8-12 hours as needed severe agitation; as of 1/31 patient has not required any doses of IM Geodon since 1/24 when she required 2 doses  Severe insomnia -Continue Remeron and trazodone   History of polysubstance abuse with heroin/history of outpatient methadone treatment -Prescribed methadone 60 mg daily from a clinic in [place] prior to admission -Has not required methadone since arrival therefore we will continue naltrexone to treat heroin addiction in addition to impulsivity and hypersexual behaviors.  This is the last drug clinicians utilize to maintain stability after patients have been successful with methadone.  Would not return to methadone at this juncture if avoidable.    Depression and severe anxiety disorder/PTSD/no bipolar disorder now with schizoaffective features -Extremes in behavior have significantly improved-continue IVC (last updated 1/19) to be renewed on 05/24/2020 -See above regarding pharmacotherapy  Other problems: Status epilepticus -Resolved -Suspect precipitated by abrupt withdrawal of methadone as reported by patient -Repeat MRI on 12/28 within normal limits and it is felt behavioral issues related to brain injury sequelae and chronic psychiatric condition.  Neurology has signed off -Vimpat, Dilantin and phenobarbital have been tapered and discontinued at recommendation of neurologist  Nonobstructive transaminitis in context of hepatitis C -Elevated HCV antibody with markedly elevated HCV RNA quantitative level  consistent with chronic hepatitis C  -LFTs  remain stable without an obstructive pattern noted -11/10: Discussed with ID Dr. Manson Passey. Treatment of hepatitis C typically is initiated in the outpatient setting. Medications can be quite expensive and usually take time to obtain insurance approval.  -ID recommends scheduling outpatient appointment their clinic closer to discharge date  Recurrent constipation -Continue Colace 100 mg twice daily prn -Continue MiraLAX to twice daily   Back pain 2/2 abnormal urinalysis consistent with UTI -Patient with low-grade fever overnight and is now having back pain for days not responsive to NSAIDs and muscle relaxers -DG lumbar spine unremarkable -During hospitalization patient has been treated for pansensitive E. coli UTI as well as Salmonella UTI both of which have been sensitive to Levaquin -Completed Levaquin 12/20-urine culture obtained after antibiotics initiated and showed no growth -CT abdomen and pelvis on 12/17 unremarkable  Salmonella UTI -Completed amoxicillin  Abnormal urinalysis -Not consistent with UTI, was positive for moderate hemoglobin and greater than 50 RBCs with an elevated specific gravity -Suspect dehydration -Patient denies back pain or history of renal calculi although given her confusion unclear if this history is accurate  Physical deconditioning -Resolved -Does continue to exhibit impulsivity but maintains appropriate balance and gait no longer requires one-to-one safety sitter  Large hemorrhoids. -Resolved -Markedly improved-LD Proctofoam 12/15  Dysphagia -Resolved -Continue regular diet   Data Reviewed: Basic Metabolic Panel: Recent Labs  Lab 05/21/20 0121  NA 134*  K 4.1  CL 98  CO2 26  GLUCOSE 150*  BUN 14  CREATININE 0.65  CALCIUM 8.3*   Liver Function Tests: Recent Labs  Lab 05/21/20 0121  AST 70*  ALT 63*  ALKPHOS 111  BILITOT 0.5  PROT 6.2*  ALBUMIN 3.4*   No results for input(s): LIPASE, AMYLASE in the last 168  hours. No results for input(s): AMMONIA in the last 168 hours. CBC: No results for input(s): WBC, NEUTROABS, HGB, HCT, MCV, PLT in the last 168 hours. Cardiac Enzymes: No results for input(s): CKTOTAL, CKMB, CKMBINDEX, TROPONINI in the last 168 hours. BNP (last 3 results) No results for input(s): BNP in the last 8760 hours.  ProBNP (last 3 results) No results for input(s): PROBNP in the last 8760 hours.  CBG: No results for input(s): GLUCAP in the last 168 hours.  Recent Results (from the past 240 hour(s))  Culture, Urine     Status: None   Collection Time: 05/16/20  9:19 AM   Specimen: Urine, Clean Catch  Result Value Ref Range Status   Specimen Description URINE, CLEAN CATCH  Final   Special Requests NONE  Final   Culture   Final    NO GROWTH Performed at [facility], [address], [place], [place] [address]    Report Status 05/17/2020 FINAL  Final     Studies: No results found.  Scheduled Meds: . carbamazepine  800 mg Oral TID  . citalopram  20 mg Oral Daily  . clonazePAM  1.5 mg Oral Q6H  . cloNIDine  0.1 mg Transdermal Weekly  . diclofenac  Sodium  4 g Topical QID  . docusate sodium  100 mg Oral BID  . feeding supplement  237 mL Oral BID BM  . melatonin  10 mg Oral QHS  . mirtazapine  30 mg Oral QHS  . multivitamin with minerals  1 tablet Oral Daily  . naltrexone  25 mg Oral Daily  . polyethylene glycol  17 g Oral BID  . vitamin B-6  100 mg Oral Daily  . thiamine  100 mg Oral Daily  . traZODone  150 mg Oral QHS  . ziprasidone  80 mg Oral 2 times per day   Continuous Infusions:   Principal Problem:   Delirium due to multiple etiologies Active Problems:   Acute metabolic encephalopathy   Hypokalemia   Polysubstance abuse (HCC)   Elevated CK   Transaminitis   Refractory seizure (HCC)   Chronic hepatitis C without hepatic coma (HCC)   Distended abdomen   Palliative care encounter   Colonic Ileus (HCC)   Organic brain syndrome   On enteral  nutrition   Physical deconditioning   Protein-calorie malnutrition (HCC)   Inadequate oral nutritional intake   Impulse disorder   Consultants:  Neurology  Psychiatry  Interventional radiology  Surgery    Procedures:  9/14 lumbar puncture  9/15 EEG  9/16 EEG  9/17 EEG  9/19 overnight EEG with video  9/22 overnight EEG with video with discontinuation of long-term EEG monitoring on 9/25  9/24 core track placement  10/6 EEG  12/15 PEG tube removed    Antibiotics: Anti-infectives (From admission, onward)   Start     Dose/Rate Route Frequency Ordered Stop   05/11/20 0845  amoxicillin-clavulanate (AUGMENTIN) 500-125 MG per tablet 500 mg        1 tablet Oral Every 8 hours 05/11/20 0750 05/18/20 0559   05/11/20 0845  metroNIDAZOLE (FLAGYL) tablet 500 mg        500 mg Oral Every 8 hours 05/11/20 0750 05/18/20 0559   04/05/20 1415  levofloxacin (LEVAQUIN) tablet 500 mg        500 mg Oral Daily 04/05/20 1327 04/11/20 0911   03/19/20 1000  metroNIDAZOLE (FLAGYL) tablet 500 mg  Status:  Discontinued        500 mg Per Tube 2 times daily 03/19/20 0822 03/23/20 0827   03/18/20 1200  amoxicillin (AMOXIL) 250 MG/5ML suspension 500 mg  Status:  Discontinued        500 mg Per Tube Every 8 hours 03/18/20 0915 03/23/20 0559   03/17/20 1200  cefTRIAXone (ROCEPHIN) 1 g in sodium chloride 0.9 % 100 mL IVPB  Status:  Discontinued        1 g 200 mL/hr over 30 Minutes Intravenous Every 24 hours 03/17/20 1048 03/18/20 0913   03/16/20 1130  metroNIDAZOLE (FLAGYL) 50 mg/ml oral suspension 500 mg  Status:  Discontinued        500 mg Per Tube 2 times daily 03/16/20 1040 03/19/20 0821   03/16/20 1130  fluconazole (DIFLUCAN) 40 MG/ML suspension 152 mg        150 mg Per Tube  Once 03/16/20 1040 03/16/20 1245   02/11/20 1526  ceFAZolin (ANCEF) IVPB 2g/100 mL premix        2 g 200 mL/hr over 30 Minutes Intravenous  Once 02/11/20 1526 02/11/20 1641   02/05/20 0600  ceFAZolin (ANCEF) IVPB  2g/100 mL premix        2 g 200 mL/hr over 30 Minutes Intravenous To Short Stay 02/04/20 1054  02/05/20 0950   01/29/20 0600  ceFAZolin (ANCEF) IVPB 2g/100 mL premix        2 g 200 mL/hr over 30 Minutes Intravenous On call to O.R. 01/28/20 1511 01/30/20 0559   01/28/20 2000  cefTRIAXone (ROCEPHIN) 1 g in sodium chloride 0.9 % 100 mL IVPB        1 g 200 mL/hr over 30 Minutes Intravenous Every 24 hours 01/28/20 1925 02/02/20 0724   01/06/20 0900  cefTRIAXone (ROCEPHIN) 2 g in sodium chloride 0.9 % 100 mL IVPB  Status:  Discontinued        2 g 200 mL/hr over 30 Minutes Intravenous Every 12 hours 01/06/20 0105 01/06/20 2202   01/06/20 0800  vancomycin (VANCOCIN) IVPB 1000 mg/200 mL premix  Status:  Discontinued       "Followed by" Linked Group Details   1,000 mg 200 mL/hr over 60 Minutes Intravenous Every 12 hours 01/05/20 1904 01/06/20 2202   01/05/20 2000  vancomycin (VANCOREADY) IVPB 1500 mg/300 mL       "Followed by" Linked Group Details   1,500 mg 150 mL/hr over 120 Minutes Intravenous  Once 01/05/20 1904 01/06/20 0127   01/05/20 1830  cefTRIAXone (ROCEPHIN) 2 g in sodium chloride 0.9 % 100 mL IVPB        2 g 200 mL/hr over 30 Minutes Intravenous  Once 01/05/20 1829 01/05/20 2220       Time spent: 25 minutes    [name] ANP  [facility] 7 am - 330 pm/M-F for direct patient care and secure chat Please see Amion for contact information 138 days

## 2020-05-24 NOTE — Progress Notes (Addendum)
CSW renewed patient's IVC and placed copies in chart.  There are no beds available at [facility] - updated notes were requested. CSW spoke with Dr. Jarvis Newcomer to place new psych consult so an updated assessment can be sent to [facility].  Edwin Dada, MSW, LCSW-A Transitions of Care  Clinical Social Worker I [phone]

## 2020-05-24 NOTE — Progress Notes (Addendum)
PROGRESS NOTE  Brief Narrative: Anita Davis is a 35 y.o. female with a history of IVDU, hepatitis C, anxiety/depression, and gestational diabetes who presented to the ED initially 01/04/2020 with AMS, subsequently developed seizures with status epilepticus ultimately controlled by multiple AEDs. She continues to have waxing/waning mentation despite control of seizures and empiric treatment for autoimmune encephalitis with IVIG per neurology.   Since admission she has not had any further seizures so these medications were discontinued.  She has eventually awakened behaviors alternating between awake and appropriate, awake crying and depressed occasionally delusional, severe agitation and aggression with significant delusions.  She has been evaluated both by neurology and psychiatry who suspect a combination of sequelae from underlying brain injury as well as bipolar disorder with schizoaffective features.   Due to labile affect and violence/aggression, she has been kept under IVC pending placement at [facility].   Subjective: When asked how she was today she says "you're just here to steal my soul anyway." Later says "I've said too much." Begins crying abruptly without seeming provocation. No violent behaviors noted. When asked about her coloring she states "I shouldn't have done that after they told me not to." Won't answer who "they" is.  Objective: BP 93/72 (BP Location: Left Arm)   Pulse 65   Temp 98.1 F (36.7 C) (Oral)   Resp 18   Ht 5\' 5"  (1.651 m)   Wt 78.9 kg   SpO2 99%   BMI 28.96 kg/m   Gen: Nontoxic female in no distress resting quietly Pulm: Nonlabored  Neuro: Alert and not cooperative with exam. Moves all extremities.  Psych: As above, she's interactive with labile mood and affect, evidence of AVH/delusions, but no violent behaviors currently. Skin: No rashes, lesions or ulcers on visualized skin  Assessment & Plan: This 35yo F with psychosis and likely brain injury s/p status  epilepticus remains medically stable for discharge to [facility] for ongoing inpatient psychiatric care. I've completed IVC paperwork this morning based on my evaluation of the patient today. I agree that ECT is contraindicated in pt with hx of status epilepticus. Pt remains with sitter and will continue current medications.   Time spent: 25 minutes on direct patient care and completion of necessary forms.  [facility], MD Pager on amion 05/24/2020, 11:13 AM

## 2020-05-24 NOTE — Consult Note (Signed)
[facility] Face-to[facility] Consult   Reason for Consult: Reassessment for ongoing psychiatric inpatient Referring Physician:  Dr. Jarvis NewcomerGrunz Patient Identification: Anita Davis MRN:  [id number] Principal Diagnosis: Delirium due to multiple etiologies Diagnosis:  Principal Problem:   Delirium due to multiple etiologies Active Problems:   Acute metabolic encephalopathy   Hypokalemia   Polysubstance abuse (HCC)   Elevated CK   Transaminitis   Refractory seizure (HCC)   Chronic hepatitis C without hepatic coma (HCC)   Distended abdomen   Palliative care encounter   Colonic Ileus (HCC)   Organic brain syndrome   On enteral nutrition   Physical deconditioning   Protein-calorie malnutrition (HCC)   Inadequate oral nutritional intake   Impulse disorder   Total Time spent with patient: 45 minutes  Subjective:   Anita Davis is a 35 y.o. female patient with a history of polysubstance use disorder, who presented to [facility] in Sep with encephalopathy. EEG showed status epilepticus arising from the left central region. She was treated with Keppra, Vimpat and Dilantin and her mental status reportedly improved. [facility] was [facility].  Per chart review she continues to have various fluctuation in her mental status, in addition to her moods.  Patient is alert and oriented, calm and cooperative however does not engage with this Clinical research associatewriter.  Patient states" talking about it does not help."  This writer sought clarification in regards to the most recent statement in which she replied " love is the right answer.  [name] was right.  As should have never touched that iPad.  They told me to sit over here and and you sit over here. "  She is then observed to going to a blank stare, in which she becomes nonverbal.  During this time she is observed to exhibit some psychomotor agitation to include restlessness and tremors, rapid blinking of the eyes while sitting [place]  style and continue to eat her chocolate ice cream.  Patient does not engage in any further, and chooses not to participate in the rest of the psychiatric reassessment.  Per chart review she continues to exhibit unpredictable behaviors, requiring as needed medications periodically.  She does not appear to be responding to internal stimuli or preoccupied.    Patient is alert and oriented, able to tell me her name, date of birth, and while she remains in the hospital at this time. On evaluation patient is found in her room sitting upright eating chocolate ice cream.  She originally was interacting with this nurse practitioner, although she did exhibit some delusional thinking she was able to have a linear conversation.  Immediately after discussing her children she developed a blank stare and became nonverbal.  At that time she wished to end the evaluation.  HP/synopsis: Please see full note from Junious SilkAllison Ellis nurse practitioner, and has also been following this patient throughout her duration of stay of 115 days.  Since admission patient has had varying levels of awakeness and orientation as well as labile mood and affect.  Essentially for the first 8 weeks of hospitalization patient would have brief episodes of being awake, sometimes oriented sometimes not.  These episodes would last for several minutes to several hours but were never sustained.  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  Multiple psychotropic medications were tried without success or caused significant side effects and were discontinued.  Decision made to taper and wean all psychotropic drugs.  After  this was done patient became extremely alert and agitated.  She was very impulsive and was demonstrating hypersexual behavior as well as extreme mood swings.  Seroquel tried initially to manage brain injury sequela but required high dosages unfortunately contributing to patient's depression and  suicidal ideation.  It was noted that each time patient was given IM Geodon that she became alert and for the most part oriented, was able to consistently communicate and perform ADLs.  Eventually she was transitioned to oral Geodon.  Naltrexone had been started to assist with underlying hypersexual behaviors and impulsivity and to treat her underlying heroin addiction since she had not had any methadone or heroin since admission.  She has also been placed on Klonopin and clonidine to aid with her severe anxiety.  Recently patient has had issues with extreme agitation around the holidays and was acting out with wandering behaviors, spitting on staff, using profanity in the hallways and fighting with security staff when restraints were applied.  Some of her behaviors have been manipulative especially in regards to attempting to obtain drugs such as benzodiazepines or narcotics.  Neurology was reconsulted.  Repeat MRI unremarkable and it is felt current behaviors are combination of brain injury sequela as well as underlying psychiatric issues from chronic drug abuse.  Recently patient has become focused on obtaining methadone.  Typically when she becomes anxious or restless she request some sort of drug: " I need a hot shot" (Ambien), " I need something for my anxiety I cannot take it anymore"; and most recently she is focused on obtaining methadone.  She remains confused and alternates between sad and depressed or angry and defiant.  The patient was observed to be in the bed in a deep sleep.  She continues to have a one-on-one sitter for her safety, as she continues to have periods of alertness, followed by some catatonia, followed by aggression and combativeness.  As noted in chart patient has had multiple failed attempts at multiple psychotropic medication to include first and second generation antipsychotic, multiple mood stabilizers, and several trials of benzodiazepines.  There have been multiple attempts  to wean her off of the benzodiazepines, however patient has notable agitation and aggression and is a concern for withdrawal syndrome.  At the time of this evaluation patient is currently taking it carbamazepine 600 mg p.o. 3 times daily, clonazepam 1.5 mg p.o. every 6 hours, Escitalopram 20 mg p.o. nightly, mirtazapine 30 mg p.o. nightly, and ziprasidone 80 mg p.o. twice daily.   Past Psychiatric History: Please see initial evaluation for full details. I have reviewed the history. No updates at this time.   Risk to Self:  yes Risk to Others:  yes Prior Inpatient Therapy:  see initial eval. No update Prior Outpatient Therapy:  see initial eval. No update  Past Medical History:  Past Medical History:  Diagnosis Date  . Allergy   . Bronchitis   . Hepatitis-C   . History of gestational diabetes   . HPV in female   . Substance abuse [facility])     Past Surgical History:  Procedure Laterality Date  . HEMORRHOID BANDING  age 44  . IR GASTROSTOMY TUBE REMOVAL  04/06/2020  . IR REPLC GASTRO/COLONIC TUBE PERCUT W/FLUORO  02/11/2020  . LAPAROSCOPIC GASTROSTOMY N/A 02/05/2020   Procedure: LAPAROSCOPIC GASTROSTOMY PLACEMENT;  Surgeon: Gaynelle Adu, MD;  Location: [facility] OR;  Service: General;  Laterality: N/A;   Family History:  Family History  Problem Relation Age of Onset  . Depression Mother   .  Cervical cancer Mother   . Hypertension Father   . Diabetes Father    Family Psychiatric  History: Please see initial evaluation for full details. I have reviewed the history. No updates at this time.    Social History:  Social History   Substance and Sexual Activity  Alcohol Use No     Social History   Substance and Sexual Activity  Drug Use Not Currently  . Types: IV, Cocaine, Hydrocodone, Oxycodone, Marijuana, Heroin   Comment: takes methadone- now taking subutex (03/27/19)    Social History   Socioeconomic History  . Marital status: Single    Spouse name: Not on file  . Number of  children: Not on file  . Years of education: Not on file  . Highest education level: Not on file  Occupational History  . Not on file  Tobacco Use  . Smoking status: Current Every Day Smoker    Years: 24.00    Types: Cigarettes, Cigars  . Smokeless tobacco: Never Used  . Tobacco comment: 1 cigar/daily. former 1 pack cigarette smoker quit 05/2019  currently smokes CBD  Vaping Use  . Vaping Use: Every day  . Substances: Nicotine, Flavoring  Substance and Sexual Activity  . Alcohol use: No  . Drug use: Not Currently    Types: IV, Cocaine, Hydrocodone, Oxycodone, Marijuana, Heroin    Comment: takes methadone- now taking subutex (03/27/19)  . Sexual activity: Not Currently    Birth control/protection: Abstinence  Other Topics Concern  . Not on file  Social History Narrative  . Not on file   Social Determinants of Health   Financial Resource Strain: Not on file  Food Insecurity: Not on file  Transportation Needs: Not on file  Physical Activity: Not on file  Stress: Not on file  Social Connections: Not on file   Additional Social History:    Allergies:  No Known Allergies  Labs: No results found for this or any previous visit (from the past 48 hour(s)).  Current Facility-Administered Medications  Medication Dose Route Frequency Provider Last Rate Last Admin  . acetaminophen (TYLENOL) tablet 650 mg  650 mg Oral Q6H PRN Russella Dar, NP   650 mg at 05/07/20 1555  . benzocaine (ORAJEL) 10 % mucosal gel   Mouth/Throat QID PRN Marikay Alar, FNP   Given at 05/13/20 0016  . carbamazepine (TEGRETOL) tablet 800 mg  800 mg Oral TID Russella Dar, NP   800 mg at 05/23/20 2206  . citalopram (CELEXA) tablet 20 mg  20 mg Oral Daily Maryagnes Amos, FNP   20 mg at 05/23/20 0902  . clonazePAM (KLONOPIN) tablet 1.5 mg  1.5 mg Oral Q6H Russella Dar, NP   1.5 mg at 05/24/20 0552  . cloNIDine (CATAPRES - Dosed in mg/24 hr) patch 0.1 mg  0.1 mg Transdermal Weekly Alwyn Ren, MD   0.1 mg at 05/21/20 1800  . diclofenac Sodium (VOLTAREN) 1 % topical gel 4 g  4 g Topical QID Russella Dar, NP   4 g at 05/23/20 1819  . docusate sodium (COLACE) capsule 100 mg  100 mg Oral BID Russella Dar, NP   100 mg at 05/23/20 2205  . feeding supplement (ENSURE ENLIVE / ENSURE PLUS) liquid 237 mL  237 mL Oral BID BM Hughie Closs, MD   237 mL at 05/23/20 0903  . ibuprofen (ADVIL) tablet 600 mg  600 mg Oral Q6H PRN Russella Dar, NP   600 mg  at 05/22/20 0401  . loratadine (CLARITIN) tablet 10 mg  10 mg Oral Daily PRN Omelia Blackwater K, NP   10 mg at 05/11/20 0500  . melatonin tablet 10 mg  10 mg Oral QHS Maryagnes Amos, FNP   10 mg at 05/23/20 2205  . mirtazapine (REMERON) tablet 30 mg  30 mg Oral QHS Russella Dar, NP   30 mg at 05/23/20 2205  . multivitamin with minerals tablet 1 tablet  1 tablet Oral Daily Russella Dar, NP   1 tablet at 05/23/20 3500  . naltrexone (DEPADE) tablet 25 mg  25 mg Oral Daily Russella Dar, NP   25 mg at 05/23/20 0902  . polyethylene glycol (MIRALAX / GLYCOLAX) packet 17 g  17 g Oral BID Russella Dar, NP   17 g at 05/23/20 2206  . pyridOXINE (VITAMIN B-6) tablet 100 mg  100 mg Oral Daily Russella Dar, NP   100 mg at 05/23/20 0902  . sodium chloride (OCEAN) 0.65 % nasal spray 1 spray  1 spray Each Nare PRN Omelia Blackwater K, NP      . thiamine tablet 100 mg  100 mg Oral Daily Russella Dar, NP   100 mg at 05/23/20 0902  . traZODone (DESYREL) tablet 150 mg  150 mg Oral QHS Russella Dar, NP   150 mg at 05/23/20 2206  . ziprasidone (GEODON) capsule 80 mg  80 mg Oral 2 times per day Marguerita Merles [place], DO   80 mg at 05/23/20 2011  . ziprasidone (GEODON) injection 20 mg  20 mg Intramuscular Q8H PRN Russella Dar, NP   20 mg at 05/16/20 1730    Musculoskeletal: Strength & Muscle Tone: N/A Gait & Station: normal Patient leans: N/A  Psychiatric Specialty Exam: Physical Exam Vitals and nursing note  reviewed.     Review of Systems  Unable to perform ROS: Other    Blood pressure 93/72, pulse 65, temperature 98.1 F (36.7 C), temperature source Oral, resp. rate 18, height 5\' 5"  (1.651 m), weight 78.9 kg, SpO2 99 %.Body mass index is 28.96 kg/m.  General Appearance: Fairly Groomed  Eye Contact:  Poor  Speech:  Clear and Coherent and Slow  Volume:  Normal,  Mood:  Dysphoric and Irritable  Affect:  Inappropriate and Restricted  Thought Process:  Irrelevant, Linear and Descriptions of Associations: Tangential,   Orientation:  Full (Time, Place, and Person)  Thought Content:  Logical, Delusions and Tangential  Suicidal Thoughts:  No  Homicidal Thoughts:  No  Memory:  Immediate;   Fair Recent;   Poor Remote;   Poor  Judgement:  Impaired  Insight:  Lacking  Psychomotor Activity:  Tremor  Concentration:  limited  Recall:  Poor  Fund of Knowledge:  Fair  Language:  Fair  Akathisia:  No  Handed:  Right  AIMS (if indicated):     Assets:  Desire for Improvement Resilience Social Support  ADL's:  Complete  Cognition:  Altered at times  Sleep:       Assessment Erlinda Solinger is a 35 y.o. year old female with a history of polysubstance use disorder, who presented to [facility] in Sep 2021 with encephalopathy. EEG showed status epilepticus arising from the left central region. She was treated with Keppra, Vimpat and Dilantin and her mental status reportedly improved. She was transferred to [facility] for further work up. Per chart review, her mental status has improved, however her mood disorder is  now manifesting delusions, psychosis, and persecutory beliefs.    #Bipolar affective psychosis  # PTSD # Substance-induced mood disorder  Patient continues to exhibit a significant amount unpredictable behaviors, paranoia, psychosis, and delusional thinking.  Her need for as needed medications has decreased in the past week, however her unpredictable behaviors, irritability, and  rapid cycling continues to be of primary concern making it an unsafe disposition for patient to discharge home.  Patient continues to require higher level of care to ensure her overall safety with goals to restore and recover her previous mental state.  Although this has been quite a feat due to extended length of stay, seizure disorder, polysubstance abuse, and altered mental status.    Treatment Plan Summary: Plan as below - Continue IVC due to high risk of self harm and others.  -Continue safety precaution and sitter for unpredicted behavior - Continue Geodon 80 mg BID  -Continue Geodon 20 mg IM twice daily as needed for agitation, aggression, and psychosis. - Monitor EKG to rule out QTc prolongation. Would advise discontinuation of Geodon if QTc>500 msec -Will continue citalopram 20 mg p.o. daily for management of behavioral disturbances. - Continue naltrexone 25 mg daily  - Continue melatonin and Trazodone100mg  po qhs  - Consider tapering off or adjust dosing Klonopin if medically appropriate.  -Continue to [facility] for long-term management of chronic psychiatric condition, unpredictable and erratic behaviors, worsening aggression and rapid cycling that results in dangerous behaviors and insult to injury to self and others.   No changes at this time see below.  We have continued to maximize all treatment options at this time with no significant improvement.  Patient continues to require higher level of care at the state psychiatric hospital for long-term management of chronic psychiatric condition. [facility] also offers medical unit which can continue to assist patient in her overall recovery of mental illness and medical health.  At this current time patient does not meet criteria for acute psychiatric hospitalization as noted she has maximized most treatment options and remains unstable.    Disposition: Recommend inpatient admission [facility] state  [facility]. Please coordinate referral with SW to faciliate admisison to [facility]. Patient appears to be treatment resistment, and all services have been maximized.     Maryagnes Amos, FNP 05/24/2020 3:54 PM

## 2020-05-25 NOTE — Progress Notes (Signed)
[facility] PROGRESS NOTE  Anita Davis TZG:[id number] DOB: March 31, 1986 DOA: 01/04/2020 PCP: [name], PA-C    02/10/20                      02/15/20                       04/08/20   Status: Remains inpatient appropriate because:Altered mental status, Unsafe d/c plan and Inpatient level of care appropriate due to severity of illness   Dispo: The patient is from: Home              Anticipated d/c is to: SNF              Anticipated d/c date is: > 3 days              Patient currently is medically stable to d/c.  Barriers to discharge: Significant aggressive and violent outbursts have improved w/ no episodes sine 1/24. Remains on priority list for [facility]. [facility] re contacted for admit given improved behaviors-this facility is ECT capable  **1/31: Chart has been reviewed by [facility] and given patient's underlying neurological condition i.e. previous history of status epilepticus she is not an appropriate candidate for ECT.  Unclear if they would accept patient for other inpatient psychiatric treatment**  **2/1: [facility] staff have reevaluated this patient.  She continues to require high level inpatient psychiatric care which is not able to be provided at [facility] behavioral health facilities.  Recommendation is to continue to pursue [facility] placement. 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  Patient presented to [facility], ER in [place] several times prior to admission.  Initial presentation was related to symptoms from acute cystitis.  Two other presentations related to insomnia.  On those occasions she was  able to be discharged from the ER.  She returned again on 13 September being brought in by family for altered mental status.  After admission this patient developed seizure activity which progressed to status epilepticus therefore she was transferred to [facility] for further neurological evaluation.  After several days recurrent seizures were aborted with combination of 3 antiepileptic drugs.  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  Fortunately this did not improve patient's mentation and she continued to have waxing and waning episodes of alertness and encephalopathy.  Since admission she has not had any further seizures so these medications were discontinued.  She has eventually awakened behaviors alternating between awake and appropriate, awake crying and depressed occasionally delusional, severe agitation and aggression with significant delusions.  She has been evaluated both by neurology and psychiatry who suspect a combination of sequelae from underlying brain injury as well as bipolar disorder with schizoaffective features.  During one of her alert phases patient did admit to being diagnosed with bipolar disease prior to admission.  Patient is on multiple psychotropic medications continued to have episodes of violent outbursts.  She had required frequent doses of IM Geodon as well as Recruitment consultant and frequent intervention by security staff during her episodes of severe agitation.  Recent  chart review by [facility] states patient would not be a candidate for ECT given recent brain injury sequela as well as presentation with status epilepticus.  **As of 2/2 patient has not required IM Geodon since 1/24 although apparently she did have an outburst on 1/31 but did not require IM Geodon.  Unfortunately this interaction was not documented in the chart.  At this time patient remains stable in a  CONTROLLED environment.  Patient continues to require high level psychiatric treatment NOT OFFERED BY [facility] and therefore remains on IVC.  Her father and stepmother have been declared her legal guardians.  She continues to have some delusions and my concern is that if she returns to a private home environment with multiple environmental stimuli she is at risk for significant psychiatric decompensation that likely would involve violent behaviors.  She is also at risk if she begins to refuse taking her medications.  Currently she is requiring an excessively high amount of benzodiazepines to control her severe anxiety and these would need to be weaned before discharge to home environment.  At this time it is not appropriate to wean her benzodiazepines as this needs to be accomplished at outside psychiatric facility in conjunction with aggressive management of current psychiatric condition.**  Subjective: Awake.  Initially this morning patient was very angry and frustrated.  She believes that someone is keeping her from making a video call with her ex-boyfriend [name].  Patient stated I know she did that to me and will not let me talk to him".  In addition she stated "I love all 3 of my kids".  Later went back to evaluate patient and she was calm with very limited verbal engagement.  Initially with mouth words that were unintelligible.  She grabbed for my hand and wanted to hold it.  Again she remained delusional initially stating "you lied to me.  She is not going to let me do that".  Then she began crying.  She later stated it was because of "Bigfoot".   Objective: Vitals:   05/24/20 2051 05/25/20 0500  BP: 119/80 115/77  Pulse: 100 72  Resp: 17 18  Temp: (!) 97.5 F (36.4 C) 98.2 F (36.8 C)  SpO2: 100% 98%    Intake/Output Summary (Last 24 hours) at 05/25/2020 0852 Last data filed at 05/25/2020 0800 Gross per 24 hour  Intake 720 ml  Output --  Net 720 ml   Filed Weights   05/22/20 0515  05/23/20 0445 05/25/20 0500  Weight: 73.6 kg 78.9 kg 77.6 kg    Exam: General: Awake, somewhat agitated earlier this morning but in no acute distress.  Later was more withdrawn and emotional/crying Pulmonary:  Lungs clear to auscultation, stable on room air Cardiac:  Normal heart sounds, pulse regular, extremities warm to touch with appropriate capillary refill Abdomen: Soft nontender nondistended with normoactive bowel sounds.  Eating 100% of meals LBM 2/1 Neurological: Moves all extremities x4, no focal neurological deficits, no tremors or rigidity/EPS sx's Psychiatric: Awake and oriented x name only.  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 During periods of alertness and lucidity patient denied use of methamphetamines prior to admission although did admit to abruptly stopping her methadone -Continue max dose Geodon 80 mg BID, naltrexone, scheduled Klonopin, Tegretol, Celexa and Remeron at hour of sleep -1/28 increased Klonopin up to 1.5 mg scheduled every 6 hours and increased Tegretol to 800 mg TID as of 1/29 with improvement in reports of severe anxiety.  LFTs remain stable with AST 70, ALT 63 with normal total bilirubin -Continue 1:1 safety sitter  -Appreciate assistance of psych team. It appears we have maxed out on all psychotropic medications for this patient.  It is noted that many of these medications will take weeks to months for full  therapeutic benefit.  Given her persistent severe behavioral swings that include violent episodes of physically attacking staff it has been determined that patient would benefit from aggressive psychiatric treatment at [facility]  --Continue IM Geodon every 8-12 hours as needed severe agitation; as of 1/31 patient has not required any doses of IM Geodon since 1/24 when she required 2 doses  Severe insomnia -Continue Remeron and trazodone   History of polysubstance abuse with heroin/history of outpatient methadone treatment -Prescribed methadone 60 mg daily from [place] to [place] -Continue naltrexone to treat heroin addiction as well as recent issues with impulsivity and hypersexual behaviors during admission which are suspected related to brain injury and worsening psychiatric condition   Depression and severe anxiety disorder/PTSD/no bipolar disorder now with schizoaffective features -Extremes in behavior have significantly improved-continue IVC (last updated 2/1) -See above regarding pharmacotherapy    Other problems: Status epilepticus -Resolved -Suspect precipitated by abrupt withdrawal of methadone as reported by patient -Repeat MRI on 12/28 within normal limits and it is felt behavioral issues related to brain injury sequelae and chronic psychiatric condition.  Neurology has signed off -Vimpat, Dilantin and phenobarbital have been tapered and discontinued at recommendation of neurologist  Nonobstructive transaminitis in context of hepatitis C -Elevated HCV antibody with markedly elevated HCV RNA quantitative level  consistent with chronic hepatitis C  -LFTs remain stable without an obstructive pattern noted -11/10: Discussed with ID Dr. Manson PasseyMandahar. Treatment of hepatitis C typically is initiated in the outpatient setting. Medications can be quite expensive and usually take time to obtain insurance approval.  -ID recommends scheduling outpatient appointment their  clinic closer to discharge date  Recurrent constipation -Continue Colace 100 mg twice daily prn -Continue MiraLAX to twice daily   Back pain 2/2 abnormal urinalysis consistent with UTI -Patient with low-grade fever overnight and is now having back pain for days not responsive to NSAIDs and muscle relaxers -DG lumbar spine unremarkable -During hospitalization patient has been treated for pansensitive E. coli UTI as well as Salmonella UTI both of which have been sensitive to Levaquin -Completed Levaquin 12/20-urine culture obtained after antibiotics initiated and showed no growth -CT abdomen and pelvis on 12/17 unremarkable  Salmonella UTI -Completed amoxicillin  Abnormal urinalysis -Not consistent with UTI, was positive for moderate hemoglobin and greater than 50 RBCs with an elevated specific gravity -Suspect dehydration -Patient denies back pain or history of renal calculi although given her confusion unclear if this history is accurate  Physical deconditioning -Resolved -Does continue to exhibit impulsivity but maintains appropriate balance and gait no longer requires one-to-one safety sitter  Large hemorrhoids. -Resolved -Markedly improved-LD Proctofoam 12/15  Dysphagia -Resolved -Continue regular diet   Data Reviewed: Basic Metabolic Panel: Recent Labs  Lab 05/21/20 0121  NA 134*  K 4.1  CL 98  CO2 26  GLUCOSE 150*  BUN 14  CREATININE 0.65  CALCIUM 8.3*   Liver Function Tests: Recent Labs  Lab 05/21/20 0121  AST 70*  ALT 63*  ALKPHOS 111  BILITOT 0.5  PROT 6.2*  ALBUMIN 3.4*   No results for input(s): LIPASE, AMYLASE in the last 168 hours. No results for input(s): AMMONIA in the last 168 hours. CBC: No results for input(s): WBC, NEUTROABS, HGB, HCT, MCV, PLT in the last 168 hours. Cardiac Enzymes: No results for input(s): CKTOTAL, CKMB, CKMBINDEX, TROPONINI in the last 168 hours. BNP (last 3 results) No results for input(s): BNP in the last  8760 hours.  ProBNP (last 3 results) No results for input(s): PROBNP in the last 8760 hours.  CBG: No results for input(s): GLUCAP in the last 168 hours.  Recent Results (from the past 240 hour(s))  Culture, Urine     Status: None   Collection Time: 05/16/20  9:19 AM   Specimen: Urine, Clean Catch  Result Value Ref Range Status   Specimen Description URINE, CLEAN CATCH  Final   Special Requests NONE  Final   Culture   Final    NO GROWTH Performed at [facility], [address], [place], [place] [address]    Report Status 05/17/2020 FINAL  Final     Studies: No results found.  Scheduled Meds: . carbamazepine  800 mg Oral TID  . citalopram  20 mg Oral Daily  . clonazePAM  1.5 mg Oral Q6H  . cloNIDine  0.1 mg Transdermal Weekly  . diclofenac Sodium  4 g Topical QID  . docusate sodium  100 mg Oral BID  . feeding supplement  237 mL Oral BID BM  . melatonin  10 mg Oral QHS  . mirtazapine  30 mg Oral QHS  . multivitamin with minerals  1 tablet Oral Daily  . naltrexone  25 mg Oral Daily  . polyethylene glycol  17 g Oral BID  . vitamin B-6  100 mg Oral Daily  . thiamine  100 mg Oral Daily  . traZODone  150 mg Oral QHS  . ziprasidone  80 mg Oral 2 times per day   Continuous Infusions:   Principal Problem:   Delirium due to multiple etiologies Active Problems:   Acute metabolic encephalopathy   Hypokalemia   Polysubstance abuse (HCC)   Elevated CK   Transaminitis   Refractory seizure (HCC)   Chronic hepatitis C without hepatic coma (HCC)   Distended abdomen   Palliative care encounter   Colonic Ileus (HCC)   Organic brain syndrome   On enteral nutrition   Physical deconditioning   Protein-calorie malnutrition (HCC)   Inadequate oral nutritional intake   Impulse disorder   Consultants:  Neurology  Psychiatry  Interventional radiology  Surgery    Procedures:  9/14 lumbar puncture  9/15 EEG  9/16 EEG  9/17 EEG  9/19 overnight EEG with  video  9/22 overnight EEG with video with discontinuation of long-term EEG monitoring on 9/25  9/24 core track placement  10/6 EEG  12/15 PEG tube removed    Antibiotics: Anti-infectives (From admission, onward)   Start     Dose/Rate Route Frequency Ordered Stop   05/11/20 0845  amoxicillin-clavulanate (AUGMENTIN) 500-125 MG per tablet 500 mg        1 tablet Oral Every 8 hours 05/11/20 0750 05/18/20 0559   05/11/20 0845  metroNIDAZOLE (FLAGYL) tablet 500 mg  500 mg Oral Every 8 hours 05/11/20 0750 05/18/20 0559   04/05/20 1415  levofloxacin (LEVAQUIN) tablet 500 mg        500 mg Oral Daily 04/05/20 1327 04/11/20 0911   03/19/20 1000  metroNIDAZOLE (FLAGYL) tablet 500 mg  Status:  Discontinued        500 mg Per Tube 2 times daily 03/19/20 0822 03/23/20 0827   03/18/20 1200  amoxicillin (AMOXIL) 250 MG/5ML suspension 500 mg  Status:  Discontinued        500 mg Per Tube Every 8 hours 03/18/20 0915 03/23/20 0559   03/17/20 1200  cefTRIAXone (ROCEPHIN) 1 g in sodium chloride 0.9 % 100 mL IVPB  Status:  Discontinued        1 g 200 mL/hr over 30 Minutes Intravenous Every 24 hours 03/17/20 1048 03/18/20 0913   03/16/20 1130  metroNIDAZOLE (FLAGYL) 50 mg/ml oral suspension 500 mg  Status:  Discontinued        500 mg Per Tube 2 times daily 03/16/20 1040 03/19/20 0821   03/16/20 1130  fluconazole (DIFLUCAN) 40 MG/ML suspension 152 mg        150 mg Per Tube  Once 03/16/20 1040 03/16/20 1245   02/11/20 1526  ceFAZolin (ANCEF) IVPB 2g/100 mL premix        2 g 200 mL/hr over 30 Minutes Intravenous  Once 02/11/20 1526 02/11/20 1641   02/05/20 0600  ceFAZolin (ANCEF) IVPB 2g/100 mL premix        2 g 200 mL/hr over 30 Minutes Intravenous To Short Stay 02/04/20 1054 02/05/20 0950   01/29/20 0600  ceFAZolin (ANCEF) IVPB 2g/100 mL premix        2 g 200 mL/hr over 30 Minutes Intravenous On call to O.R. 01/28/20 1511 01/30/20 0559   01/28/20 2000  cefTRIAXone (ROCEPHIN) 1 g in sodium chloride  0.9 % 100 mL IVPB        1 g 200 mL/hr over 30 Minutes Intravenous Every 24 hours 01/28/20 1925 02/02/20 0724   01/06/20 0900  cefTRIAXone (ROCEPHIN) 2 g in sodium chloride 0.9 % 100 mL IVPB  Status:  Discontinued        2 g 200 mL/hr over 30 Minutes Intravenous Every 12 hours 01/06/20 0105 01/06/20 2202   01/06/20 0800  vancomycin (VANCOCIN) IVPB 1000 mg/200 mL premix  Status:  Discontinued       "Followed by" Linked Group Details   1,000 mg 200 mL/hr over 60 Minutes Intravenous Every 12 hours 01/05/20 1904 01/06/20 2202   01/05/20 2000  vancomycin (VANCOREADY) IVPB 1500 mg/300 mL       "Followed by" Linked Group Details   1,500 mg 150 mL/hr over 120 Minutes Intravenous  Once 01/05/20 1904 01/06/20 0127   01/05/20 1830  cefTRIAXone (ROCEPHIN) 2 g in sodium chloride 0.9 % 100 mL IVPB        2 g 200 mL/hr over 30 Minutes Intravenous  Once 01/05/20 1829 01/05/20 2220       Time spent: 25 minutes    [name] ANP  [facility] 7 am - 330 pm/M-F for direct patient care and secure chat Please see Amion for contact information 140 days

## 2020-05-26 MED ORDER — CLONAZEPAM 1 MG PO TABS
1.0000 mg | ORAL_TABLET | Freq: Four times a day (QID) | ORAL | Status: DC
Start: 2020-05-26 — End: 2020-06-28
  Administered 2020-05-26 – 2020-06-28 (×129): 1 mg via ORAL
  Filled 2020-05-26 (×131): qty 1

## 2020-05-26 NOTE — Progress Notes (Signed)
[facility] PROGRESS NOTE  Anita Davis BBC:[id number] DOB: 1986-01-17 DOA: 01/04/2020 PCP: [name], PA-C    02/10/20                      02/15/20                       04/08/20   Status: Remains inpatient appropriate because:Altered mental status, Unsafe d/c plan and Inpatient level of care appropriate due to severity of illness   Dispo: The patient is from: Home              Anticipated d/c is to: SNF              Anticipated d/c date is: > 3 days              Patient currently is medically stable to d/c.  Barriers to discharge: Significant aggressive and violent outbursts have improved w/ no episodes sine 1/24. Remains on priority list for [facility]. [facility] re contacted for admit given improved behaviors-this facility is ECT capable  **1/31: Chart has been reviewed by [facility] and given patient's underlying neurological condition i.e. previous history of status epilepticus she is not an appropriate candidate for ECT.**  **2/1: [facility] staff have reevaluated this patient.  She continues to require high level inpatient psychiatric care which is not able to be provided at [facility] behavioral health facilities.  Recommendation is to continue to pursue [facility] placement. 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  Patient presented to [facility], ER in [place] several times prior to admission.  Initial presentation was related to symptoms from acute cystitis.  Two other presentations related to insomnia.  On those occasions she was able to be discharged from the ER.  She returned again on 13 September being brought  in by family for altered mental status.  After admission this patient developed seizure activity which progressed to status epilepticus therefore she was transferred to [facility] for further neurological evaluation.  After several days recurrent seizures were aborted with combination of 3 antiepileptic drugs.  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  Fortunately this did not improve patient's mentation and she continued to have waxing and waning episodes of alertness and encephalopathy.  Since admission she has not had any further seizures so these medications were discontinued.  She has eventually awakened behaviors alternating between awake and appropriate, awake crying and depressed occasionally delusional, severe agitation and aggression with significant delusions.  She has been evaluated both by neurology and psychiatry who suspect a combination of sequelae from underlying brain injury as well as bipolar disorder with schizoaffective features.  During one of her alert phases patient did admit to being diagnosed with bipolar disease prior to admission.  Patient is on multiple psychotropic medications continued to have episodes of violent outbursts.  She had required frequent doses of IM Geodon as well as Recruitment consultant and frequent intervention by security staff during her episodes of severe agitation.  Recent chart review by [facility] states patient would not be a candidate for  ECT given recent brain injury sequela as well as presentation with status epilepticus.  **As of 2/2 patient has not required IM Geodon since 1/24 although apparently she did have an outburst on 1/31 but did not require IM Geodon.  Unfortunately this interaction was not documented in the chart.  At this time patient remains stable in a CONTROLLED environment.  Patient continues to require high level psychiatric treatment NOT  OFFERED BY [facility] and therefore remains on IVC.  Her father and stepmother have been declared her legal guardians.  She continues to have some delusions and my concern is that if she returns to a private home environment with multiple environmental stimuli she is at risk for significant psychiatric decompensation that likely would involve violent behaviors.  She is also at risk if she begins to refuse taking her medications.  Currently she is requiring an excessively high amount of benzodiazepines to control her severe anxiety and these would need to be weaned before discharge to home environment.  At this time it is not appropriate to wean her benzodiazepines as this needs to be accomplished at outside psychiatric facility in conjunction with aggressive management of current psychiatric condition.**  Subjective: Awake.  Minimally conversant.  Very sad.  Delusional stating that she is "Judas".  And said that this writer was "the devil".   Objective: Vitals:   05/25/20 1350 05/26/20 0100  BP: (!) 104/57 110/66  Pulse: 64 68  Resp: 18 18  Temp: 99.3 F (37.4 C) 98.4 F (36.9 C)  SpO2: 100% 100%    Intake/Output Summary (Last 24 hours) at 05/26/2020 0826 Last data filed at 05/25/2020 1700 Gross per 24 hour  Intake 1080 ml  Output -  Net 1080 ml   Filed Weights   05/22/20 0515 05/23/20 0445 05/25/20 0500  Weight: 73.6 kg 78.9 kg 77.6 kg    Exam: General: Awake, calm and delusional, no acute distress Pulmonary:  Lungs are clear to auscultation, stable on room air Cardiac:  Normotensive, pulse regular, extremities warm to touch, heart sounds are normal Abdomen: Obese, soft, nondistended and nontender.  Normoactive bowel sounds.  Eating 100% of meals.  LBM 2/1 Neurological: Moves all extremities x4, no focal neurological deficits, no tremors or rigidity/EPS sx's Psychiatric: Awake and oriented x name only.  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 During periods of alertness and lucidity patient denied use of methamphetamines prior to admission although did admit to abruptly stopping her methadone -Continue max dose Geodon 80 mg BID, naltrexone, scheduled Klonopin, Tegretol, Celexa and Remeron at hour of sleep -1/28 increased Tegretol to 800 mg TID as of 1/29 with improvement in reports of severe anxiety.  LFTs remain stable with AST 70, ALT 63 with normal total bilirubin -Given recurrent worsening of depressive symptoms will decrease Klonopin back down to 1 mg 4 times daily.  If insomnia worsens can change nighttime dose to 2 mg. -Continue 1:1 safety sitter  -Appreciate assistance of psych team. It appears we have maxed out on all psychotropic medications for this patient.  It is noted  that many of these medications will take weeks to months for full therapeutic benefit.  Given her persistent severe behavioral swings that include violent episodes of physically attacking staff it has been determined that patient would benefit from aggressive psychiatric treatment at [facility]  --Continue IM Geodon every 8-12 hours as needed severe agitation; as of 1/31 patient has not required any doses of IM Geodon since 1/24 when she required 2 doses  Severe insomnia -Continue Remeron and trazodone   History of polysubstance abuse with heroin/history of outpatient methadone treatment -Prescribed methadone 60 mg daily from a clinic in [place]  prior to admission -Continue naltrexone to treat heroin addiction as well as recent issues with impulsivity and hypersexual behaviors during admission which are suspected related to brain injury and worsening psychiatric condition   Depression and severe anxiety disorder/PTSD/no bipolar disorder now with schizoaffective features -Extremes in behavior have significantly improved-continue IVC (last updated 2/1) -See above regarding pharmacotherapy    Other problems: Status epilepticus -Resolved -Suspect precipitated by abrupt withdrawal of methadone as reported by patient -Repeat MRI on 12/28 within normal limits and it is felt behavioral issues related to brain injury sequelae and chronic psychiatric condition.  Neurology has signed off -Vimpat, Dilantin and phenobarbital have been tapered and discontinued at recommendation of neurologist  Nonobstructive transaminitis in context of hepatitis C -Elevated HCV antibody with markedly elevated HCV RNA quantitative level  consistent with chronic hepatitis C  -LFTs remain stable without an obstructive pattern noted -11/10: Discussed with ID Dr. Manson Passey. Treatment of hepatitis C typically is initiated in the outpatient setting. Medications can be quite expensive and usually take time to obtain insurance approval.  -ID recommends scheduling outpatient appointment their clinic closer to discharge date  Recurrent constipation -Continue Colace 100 mg twice daily prn -Continue MiraLAX to twice daily   Back pain 2/2 abnormal urinalysis consistent with UTI -Patient with low-grade fever overnight and is now having back pain for days not responsive to NSAIDs and muscle relaxers -DG lumbar spine unremarkable -During hospitalization patient has been treated for pansensitive E. coli UTI as well as Salmonella UTI both of which have been sensitive to Levaquin -Completed Levaquin 12/20-urine culture obtained after antibiotics initiated and showed no  growth -CT abdomen and pelvis on 12/17 unremarkable  Salmonella UTI -Completed amoxicillin  Abnormal urinalysis -Not consistent with UTI, was positive for moderate hemoglobin and greater than 50 RBCs with an elevated specific gravity -Suspect dehydration -Patient denies back pain or history of renal calculi although given her confusion unclear if this history is accurate  Physical deconditioning -Resolved -Does continue to exhibit impulsivity but maintains appropriate balance and gait no longer requires one-to-one safety sitter  Large hemorrhoids. -Resolved -Markedly improved-LD Proctofoam 12/15  Dysphagia -Resolved -Continue regular diet   Data Reviewed: Basic Metabolic Panel: Recent Labs  Lab 05/21/20 0121  NA 134*  K 4.1  CL 98  CO2 26  GLUCOSE 150*  BUN 14  CREATININE 0.65  CALCIUM 8.3*   Liver Function Tests: Recent Labs  Lab 05/21/20 0121  AST 70*  ALT 63*  ALKPHOS 111  BILITOT 0.5  PROT 6.2*  ALBUMIN 3.4*   No results for input(s): LIPASE, AMYLASE in the last 168 hours. No results for input(s): AMMONIA in the last 168 hours. CBC: No results for input(s): WBC, NEUTROABS, HGB, HCT, MCV, PLT in the last 168 hours. Cardiac Enzymes: No results for input(s): CKTOTAL, CKMB, CKMBINDEX, TROPONINI in the last 168 hours. BNP (last 3 results) No results for input(s):  BNP in the last 8760 hours.  ProBNP (last 3 results) No results for input(s): PROBNP in the last 8760 hours.  CBG: No results for input(s): GLUCAP in the last 168 hours.  Recent Results (from the past 240 hour(s))  Culture, Urine     Status: None   Collection Time: 05/16/20  9:19 AM   Specimen: Urine, Clean Catch  Result Value Ref Range Status   Specimen Description URINE, CLEAN CATCH  Final   Special Requests NONE  Final   Culture   Final    NO GROWTH Performed at [facility], [address], [place], [place] [address]    Report Status 05/17/2020 FINAL  Final      Studies: No results found.  Scheduled Meds: . carbamazepine  800 mg Oral TID  . citalopram  20 mg Oral Daily  . clonazePAM  1.5 mg Oral Q6H  . cloNIDine  0.1 mg Transdermal Weekly  . diclofenac Sodium  4 g Topical QID  . docusate sodium  100 mg Oral BID  . feeding supplement  237 mL Oral BID BM  . melatonin  10 mg Oral QHS  . mirtazapine  30 mg Oral QHS  . multivitamin with minerals  1 tablet Oral Daily  . naltrexone  25 mg Oral Daily  . polyethylene glycol  17 g Oral BID  . vitamin B-6  100 mg Oral Daily  . thiamine  100 mg Oral Daily  . traZODone  150 mg Oral QHS  . ziprasidone  80 mg Oral 2 times per day   Continuous Infusions:   Principal Problem:   Delirium due to multiple etiologies Active Problems:   Acute metabolic encephalopathy   Hypokalemia   Polysubstance abuse (HCC)   Elevated CK   Transaminitis   Refractory seizure (HCC)   Chronic hepatitis C without hepatic coma (HCC)   Distended abdomen   Palliative care encounter   Colonic Ileus (HCC)   Organic brain syndrome   On enteral nutrition   Physical deconditioning   Protein-calorie malnutrition (HCC)   Inadequate oral nutritional intake   Impulse disorder   Consultants:  Neurology  Psychiatry  Interventional radiology  Surgery    Procedures:  9/14 lumbar puncture  9/15 EEG  9/16 EEG  9/17 EEG  9/19 overnight EEG with video  9/22 overnight EEG with video with discontinuation of long-term EEG monitoring on 9/25  9/24 core track placement  10/6 EEG  12/15 PEG tube removed    Antibiotics: Anti-infectives (From admission, onward)   Start     Dose/Rate Route Frequency Ordered Stop   05/11/20 0845  amoxicillin-clavulanate (AUGMENTIN) 500-125 MG per tablet 500 mg        1 tablet Oral Every 8 hours 05/11/20 0750 05/18/20 0559   05/11/20 0845  metroNIDAZOLE (FLAGYL) tablet 500 mg        500 mg Oral Every 8 hours 05/11/20 0750 05/18/20 0559   04/05/20 1415  levofloxacin  (LEVAQUIN) tablet 500 mg        500 mg Oral Daily 04/05/20 1327 04/11/20 0911   03/19/20 1000  metroNIDAZOLE (FLAGYL) tablet 500 mg  Status:  Discontinued        500 mg Per Tube 2 times daily 03/19/20 0822 03/23/20 0827   03/18/20 1200  amoxicillin (AMOXIL) 250 MG/5ML suspension 500 mg  Status:  Discontinued        500 mg Per Tube Every 8 hours 03/18/20 0915 03/23/20 0559   03/17/20 1200  cefTRIAXone (  ROCEPHIN) 1 g in sodium chloride 0.9 % 100 mL IVPB  Status:  Discontinued        1 g 200 mL/hr over 30 Minutes Intravenous Every 24 hours 03/17/20 1048 03/18/20 0913   03/16/20 1130  metroNIDAZOLE (FLAGYL) 50 mg/ml oral suspension 500 mg  Status:  Discontinued        500 mg Per Tube 2 times daily 03/16/20 1040 03/19/20 0821   03/16/20 1130  fluconazole (DIFLUCAN) 40 MG/ML suspension 152 mg        150 mg Per Tube  Once 03/16/20 1040 03/16/20 1245   02/11/20 1526  ceFAZolin (ANCEF) IVPB 2g/100 mL premix        2 g 200 mL/hr over 30 Minutes Intravenous  Once 02/11/20 1526 02/11/20 1641   02/05/20 0600  ceFAZolin (ANCEF) IVPB 2g/100 mL premix        2 g 200 mL/hr over 30 Minutes Intravenous To Short Stay 02/04/20 1054 02/05/20 0950   01/29/20 0600  ceFAZolin (ANCEF) IVPB 2g/100 mL premix        2 g 200 mL/hr over 30 Minutes Intravenous On call to O.R. 01/28/20 1511 01/30/20 0559   01/28/20 2000  cefTRIAXone (ROCEPHIN) 1 g in sodium chloride 0.9 % 100 mL IVPB        1 g 200 mL/hr over 30 Minutes Intravenous Every 24 hours 01/28/20 1925 02/02/20 0724   01/06/20 0900  cefTRIAXone (ROCEPHIN) 2 g in sodium chloride 0.9 % 100 mL IVPB  Status:  Discontinued        2 g 200 mL/hr over 30 Minutes Intravenous Every 12 hours 01/06/20 0105 01/06/20 2202   01/06/20 0800  vancomycin (VANCOCIN) IVPB 1000 mg/200 mL premix  Status:  Discontinued       "Followed by" Linked Group Details   1,000 mg 200 mL/hr over 60 Minutes Intravenous Every 12 hours 01/05/20 1904 01/06/20 2202   01/05/20 2000  vancomycin  (VANCOREADY) IVPB 1500 mg/300 mL       "Followed by" Linked Group Details   1,500 mg 150 mL/hr over 120 Minutes Intravenous  Once 01/05/20 1904 01/06/20 0127   01/05/20 1830  cefTRIAXone (ROCEPHIN) 2 g in sodium chloride 0.9 % 100 mL IVPB        2 g 200 mL/hr over 30 Minutes Intravenous  Once 01/05/20 1829 01/05/20 2220       Time spent: 25 minutes    Junious SilkAllison Ellis ANP  [facility] 7 am - 330 pm/M-F for direct patient care and secure chat Please see Amion for contact information 141 days

## 2020-05-27 NOTE — Progress Notes (Signed)
[facility] PROGRESS NOTE  Anita Davis JQZ:[id number] DOB: [birth year] DOA: 01/04/2020 PCP: [name], PA-C    02/10/20                      02/15/20                       04/08/20   Status: Remains inpatient appropriate because:Altered mental status, Unsafe d/c plan and Inpatient level of care appropriate due to severity of illness   Dispo: The patient is from: Home              Anticipated d/c is to: SNF              Anticipated d/c date is: > 3 days              Patient currently is medically stable to d/c.  Barriers to discharge: Significant aggressive and violent outbursts have improved w/ no episodes sine 1/24. Remains on priority list for [facility]. [facility] re contacted for admit given improved behaviors-this facility is ECT capable  **1/31: Chart has been reviewed by [facility] and given patient's underlying neurological condition i.e. previous history of status epilepticus she is not an appropriate candidate for ECT.**  **2/1: [facility] staff have reevaluated this patient.  She continues to require high level inpatient psychiatric care which is not able to be provided at [facility] behavioral health facilities.  Recommendation is to continue to pursue [facility] placement. 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  Patient presented to [facility], ER in [place] several times prior to admission.  Initial presentation was related to symptoms from acute cystitis.  Two other presentations related to insomnia.  On those occasions she was able to be discharged from the ER.  She returned again on 13 September being brought  in by family for altered mental status.  After admission this patient developed seizure activity which progressed to status epilepticus therefore she was transferred to [facility] for further neurological evaluation.  After several days recurrent seizures were aborted with combination of 3 antiepileptic drugs.  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  Fortunately this did not improve patient's mentation and she continued to have waxing and waning episodes of alertness and encephalopathy.  Since admission she has not had any further seizures so these medications were discontinued.  She has eventually awakened behaviors alternating between awake and appropriate, awake crying and depressed occasionally delusional, severe agitation and aggression with significant delusions.  She has been evaluated both by neurology and psychiatry who suspect a combination of sequelae from underlying brain injury as well as bipolar disorder with schizoaffective features.  During one of her alert phases patient did admit to being diagnosed with bipolar disease prior to admission.  Patient is on multiple psychotropic medications continued to have episodes of violent outbursts.  She had required frequent doses of IM Geodon as well as Recruitment consultant and frequent intervention by security staff during her episodes of severe agitation.  Recent chart review by [facility] states patient would not be a candidate for  ECT given recent brain injury sequela as well as presentation with status epilepticus.  **As of 2/2 patient has not required IM Geodon since 1/24 although apparently she did have an outburst on 1/31 but did not require IM Geodon.  Unfortunately this interaction was not documented in the chart.  At this time patient remains stable in a CONTROLLED environment.  Patient continues to require high level psychiatric treatment NOT  OFFERED BY [facility] and therefore remains on IVC.  Her father and stepmother have been declared her legal guardians.  She continues to have some delusions and my concern is that if she returns to a private home environment with multiple environmental stimuli she is at risk for significant psychiatric decompensation that likely would involve violent behaviors.  She is also at risk if she begins to refuse taking her medications.  Currently she is requiring an excessively high amount of benzodiazepines to control her severe anxiety and these would need to be weaned before discharge to home environment.  At this time it is not appropriate to wean her benzodiazepines as this needs to be accomplished at outside psychiatric facility in conjunction with aggressive management of current psychiatric condition.**  Subjective: Sent.  Sitting crosslegged up in bed.  Nursing assistant sitting across from patient.  [name] is performing a manicure on her by painting her fingernails.  When asking [name] how she was doing she stated "I am fine; I been reading and looking at possible classes. I am interested in studying psychology"   Objective: Vitals:   05/26/20 2025 05/27/20 0500  BP: 124/81 120/81  Pulse: 75 68  Resp: 17 17  Temp: 98.4 F (36.9 C) 98.2 F (36.8 C)  SpO2: 100% 100%    Intake/Output Summary (Last 24 hours) at 05/27/2020 0859 Last data filed at 05/26/2020 0900 Gross per 24 hour  Intake 250 ml  Output -  Net 250 ml   Filed Weights   05/22/20 0515 05/23/20 0445 05/25/20 0500  Weight: 73.6 kg 78.9 kg 77.6 kg    Exam: General: Alert, no acute distress and calm Pulmonary:  Clear to auscultation, stable on room air Cardiac:  S1-S2, no peripheral edema, pulse regular.  Extremities warm to touch.  Color pink Abdomen: Obese, soft nontender nondistended.  Tolerating diet.  LBM 2/4 Neurological: Moves all extremities x4, no focal neurological deficits, no tremors or rigidity/EPS sx's Psychiatric:  Awake and oriented x name only.  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 During periods of alertness and lucidity patient denied use of methamphetamines prior to admission although did admit to abruptly stopping her methadone -Continue max dose Geodon 80 mg BID, naltrexone, scheduled Klonopin, Tegretol, Celexa and Remeron at hour of sleep -1/28 increased Tegretol to 800 mg TID as of 1/29 with improvement in reports of severe anxiety.  LFTs remain stable with AST 70, ALT 63 with normal total bilirubin -Given recurrent worsening of depressive symptoms will decrease Klonopin back down to 1 mg 4 times daily.  If insomnia worsens can change nighttime dose to 2 mg. -Continue 1:1 safety sitter  -  Appreciate assistance of psych team. It appears we have maxed out on all psychotropic medications for this patient.  It is noted that many of these medications will take weeks to months for full therapeutic benefit.  Given her persistent severe behavioral swings that include violent episodes of physically attacking staff it has been determined that patient would benefit from aggressive psychiatric treatment at [facility]  --Continue IM Geodon every 8-12 hours as needed severe agitation; as of 1/31 patient has not required any doses of IM Geodon since 1/24 when she required 2 doses -2/4 patient in great spirits, more alert and oriented and interactive with staff as documented above.   Again she has demonstrated wide-ranging emotions, ability to interact with staff and appropriateness that alternates with severe delusions and at times aggressive and violent behaviors.  Severe insomnia -Continue Remeron and trazodone   History of polysubstance abuse with heroin/history of outpatient methadone treatment -Prescribed methadone 60 mg daily from a clinic in [place] prior to admission -Continue naltrexone to treat heroin addiction as well as recent issues with impulsivity and hypersexual behaviors during admission which are suspected related to brain injury and worsening psychiatric condition   Depression and severe anxiety disorder/PTSD/no bipolar disorder now with schizoaffective features -Extremes in behavior have significantly improved-continue IVC (last updated 2/1) -See above regarding pharmacotherapy    Other problems: Status epilepticus -Resolved -Suspect precipitated by abrupt withdrawal of methadone as reported by patient -Repeat MRI on 12/28 within normal limits and it is felt behavioral issues related to brain injury sequelae and chronic psychiatric condition.  Neurology has signed off -Vimpat, Dilantin and phenobarbital have been tapered and discontinued at recommendation of neurologist  Nonobstructive transaminitis in context of hepatitis C -Elevated HCV antibody with markedly elevated HCV RNA quantitative level  consistent with chronic hepatitis C  -LFTs remain stable without an obstructive pattern noted -11/10: Discussed with ID Dr. Manson Passey. Treatment of hepatitis C typically is initiated in the outpatient setting. Medications can be quite expensive and usually take time to obtain insurance approval.  -ID recommends scheduling outpatient appointment their clinic closer to discharge date  Recurrent constipation -Continue Colace 100 mg twice daily prn -Continue MiraLAX to twice daily   Back pain 2/2 abnormal urinalysis consistent with UTI -Patient  with low-grade fever overnight and is now having back pain for days not responsive to NSAIDs and muscle relaxers -DG lumbar spine unremarkable -During hospitalization patient has been treated for pansensitive E. coli UTI as well as Salmonella UTI both of which have been sensitive to Levaquin -Completed Levaquin 12/20-urine culture obtained after antibiotics initiated and showed no growth -CT abdomen and pelvis on 12/17 unremarkable  Salmonella UTI -Completed amoxicillin  Abnormal urinalysis -Not consistent with UTI, was positive for moderate hemoglobin and greater than 50 RBCs with an elevated specific gravity -Suspect dehydration -Patient denies back pain or history of renal calculi although given her confusion unclear if this history is accurate  Physical deconditioning -Resolved -Does continue to exhibit impulsivity but maintains appropriate balance and gait no longer requires one-to-one safety sitter  Large hemorrhoids. -Resolved -Markedly improved-LD Proctofoam 12/15  Dysphagia -Resolved -Continue regular diet   Data Reviewed: Basic Metabolic Panel: Recent Labs  Lab 05/21/20 0121  NA 134*  K 4.1  CL 98  CO2 26  GLUCOSE 150*  BUN 14  CREATININE 0.65  CALCIUM 8.3*   Liver Function Tests: Recent Labs  Lab 05/21/20 0121  AST 70*  ALT 63*  ALKPHOS 111  BILITOT 0.5  PROT 6.2*  ALBUMIN 3.4*   No results for input(s): LIPASE, AMYLASE in the last 168 hours. No results for input(s): AMMONIA in the last 168 hours. CBC: No results for input(s): WBC, NEUTROABS, HGB, HCT, MCV, PLT in the last 168 hours. Cardiac Enzymes: No results for input(s): CKTOTAL, CKMB, CKMBINDEX, TROPONINI in the last 168 hours. BNP (last 3 results) No results for input(s): BNP in the last 8760 hours.  ProBNP (last 3 results) No results for input(s): PROBNP in the last 8760 hours.  CBG: No results for input(s): GLUCAP in the last 168 hours.  No results found for this or any previous  visit (from the past 240 hour(s)).   Studies: No results found.  Scheduled Meds: . carbamazepine  800 mg Oral TID  . citalopram  20 mg Oral Daily  . clonazePAM  1 mg Oral Q6H  . cloNIDine  0.1 mg Transdermal Weekly  . diclofenac Sodium  4 g Topical QID  . docusate sodium  100 mg Oral BID  . feeding supplement  237 mL Oral BID BM  . melatonin  10 mg Oral QHS  . mirtazapine  30 mg Oral QHS  . multivitamin with minerals  1 tablet Oral Daily  . naltrexone  25 mg Oral Daily  . polyethylene glycol  17 g Oral BID  . vitamin B-6  100 mg Oral Daily  . thiamine  100 mg Oral Daily  . traZODone  150 mg Oral QHS  . ziprasidone  80 mg Oral 2 times per day   Continuous Infusions:   Principal Problem:   Delirium due to multiple etiologies Active Problems:   Acute metabolic encephalopathy   Hypokalemia   Polysubstance abuse (HCC)   Elevated CK   Transaminitis   Refractory seizure (HCC)   Chronic hepatitis C without hepatic coma (HCC)   Distended abdomen   Palliative care encounter   Colonic Ileus (HCC)   Organic brain syndrome   On enteral nutrition   Physical deconditioning   Protein-calorie malnutrition (HCC)   Inadequate oral nutritional intake   Impulse disorder   Consultants:  Neurology  Psychiatry  Interventional radiology  Surgery    Procedures:  9/14 lumbar puncture  9/15 EEG  9/16 EEG  9/17 EEG  9/19 overnight EEG with video  9/22 overnight EEG with video with discontinuation of long-term EEG monitoring on 9/25  9/24 core track placement  10/6 EEG  12/15 PEG tube removed    Antibiotics: Anti-infectives (From admission, onward)   Start     Dose/Rate Route Frequency Ordered Stop   05/11/20 0845  amoxicillin-clavulanate (AUGMENTIN) 500-125 MG per tablet 500 mg        1 tablet Oral Every 8 hours 05/11/20 0750 05/18/20 0559   05/11/20 0845  metroNIDAZOLE (FLAGYL) tablet 500 mg        500 mg Oral Every 8 hours 05/11/20 0750 05/18/20 0559    04/05/20 1415  levofloxacin (LEVAQUIN) tablet 500 mg        500 mg Oral Daily 04/05/20 1327 04/11/20 0911   03/19/20 1000  metroNIDAZOLE (FLAGYL) tablet 500 mg  Status:  Discontinued        500 mg Per Tube 2 times daily 03/19/20 0822 03/23/20 0827   03/18/20 1200  amoxicillin (AMOXIL) 250 MG/5ML suspension 500 mg  Status:  Discontinued        500 mg Per Tube Every 8 hours 03/18/20 0915 03/23/20 0559   03/17/20 1200  cefTRIAXone (ROCEPHIN) 1 g in sodium chloride 0.9 % 100  mL IVPB  Status:  Discontinued        1 g 200 mL/hr over 30 Minutes Intravenous Every 24 hours 03/17/20 1048 03/18/20 0913   03/16/20 1130  metroNIDAZOLE (FLAGYL) 50 mg/ml oral suspension 500 mg  Status:  Discontinued        500 mg Per Tube 2 times daily 03/16/20 1040 03/19/20 0821   03/16/20 1130  fluconazole (DIFLUCAN) 40 MG/ML suspension 152 mg        150 mg Per Tube  Once 03/16/20 1040 03/16/20 1245   02/11/20 1526  ceFAZolin (ANCEF) IVPB 2g/100 mL premix        2 g 200 mL/hr over 30 Minutes Intravenous  Once 02/11/20 1526 02/11/20 1641   02/05/20 0600  ceFAZolin (ANCEF) IVPB 2g/100 mL premix        2 g 200 mL/hr over 30 Minutes Intravenous To Short Stay 02/04/20 1054 02/05/20 0950   01/29/20 0600  ceFAZolin (ANCEF) IVPB 2g/100 mL premix        2 g 200 mL/hr over 30 Minutes Intravenous On call to O.R. 01/28/20 1511 01/30/20 0559   01/28/20 2000  cefTRIAXone (ROCEPHIN) 1 g in sodium chloride 0.9 % 100 mL IVPB        1 g 200 mL/hr over 30 Minutes Intravenous Every 24 hours 01/28/20 1925 02/02/20 0724   01/06/20 0900  cefTRIAXone (ROCEPHIN) 2 g in sodium chloride 0.9 % 100 mL IVPB  Status:  Discontinued        2 g 200 mL/hr over 30 Minutes Intravenous Every 12 hours 01/06/20 0105 01/06/20 2202   01/06/20 0800  vancomycin (VANCOCIN) IVPB 1000 mg/200 mL premix  Status:  Discontinued       "Followed by" Linked Group Details   1,000 mg 200 mL/hr over 60 Minutes Intravenous Every 12 hours 01/05/20 1904 01/06/20 2202    01/05/20 2000  vancomycin (VANCOREADY) IVPB 1500 mg/300 mL       "Followed by" Linked Group Details   1,500 mg 150 mL/hr over 120 Minutes Intravenous  Once 01/05/20 1904 01/06/20 0127   01/05/20 1830  cefTRIAXone (ROCEPHIN) 2 g in sodium chloride 0.9 % 100 mL IVPB        2 g 200 mL/hr over 30 Minutes Intravenous  Once 01/05/20 1829 01/05/20 2220       Time spent: 25 minutes    [name] ANP  [facility] 7 am - 330 pm/M-F for direct patient care and secure chat Please see Amion for contact information 142 days

## 2020-05-27 NOTE — Progress Notes (Signed)
CSW spoke with [facility] staff who stated there are no beds available today.  Amberly Livas, MSW, LCSW-A Transitions of Care  Clinical Social Worker I [phone]  

## 2020-05-28 NOTE — Progress Notes (Signed)
PROGRESS NOTE  Brief Narrative: Anita Davis is a 35 y.o. female with a history of IVDU, hepatitis C, anxiety/depression, and gestational diabetes who presented to the ED initially 01/04/2020 with AMS, subsequently developed seizures with status epilepticus ultimately controlled by multiple AEDs. She continues to have waxing/waning mentation despite control of seizures and empiric treatment for autoimmune encephalitis with IVIG per neurology.   Since admission she has not had any further seizures so these medications were discontinued.  She has eventually awakened behaviors alternating between awake and appropriate, awake crying and depressed occasionally delusional, severe agitation and aggression with significant delusions.  She has been evaluated both by neurology and psychiatry who suspect a combination of sequelae from underlying brain injury as well as bipolar disorder with schizoaffective features.   Due to labile affect and violence/aggression, she has been kept under IVC pending placement at [facility].   Subjective: No major events noted in past 24 hours. Pt's primary complaint is mild ache in lower back that is intermittent, no change with positions nonradiating with no changes in urination/stools.   Objective: BP 114/84 (BP Location: Left Arm)   Pulse 66   Temp 97.9 F (36.6 C) (Oral)   Resp 18   Ht 5\' 5"  (1.651 m)   Wt 77.6 kg   SpO2 99%   BMI 28.45 kg/m   Gen: Nontoxic female resting quietly Pulm: Clear, nonlabored CV: RRR, no murmurs Neuro: Alert, MAEW, oriented MSK: No deformities, no significant midline or paraspinal tenderness or stepoffs.   Psych: Calm, not fully examined in this regard today. Skin: No new wounds.  Assessment & Plan: This 35yo F initially admitted for seizures, status epilepticus and acute encephalopathy now struggling with treatment-refractory psychosis and mood disorder being followed regularly by psychiatry continues to require inpatient psychiatric  services and is pending transfer to [facility] pending bed availability (none available still). She remains under IVC with stable medical findings. I will restart NSAID (ibuprofen) prn for back pain which appears mild and musculoskeletal without neurologic features. Encourage OOB.    Time spent: 25 minutes  [facility], MD Pager on [facility] 05/28/2020, 11:54 AM

## 2020-05-29 MED ORDER — KETOROLAC TROMETHAMINE 15 MG/ML IJ SOLN
15.0000 mg | Freq: Once | INTRAMUSCULAR | Status: AC
  Administered 2020-05-29: 15 mg via INTRAVENOUS
  Filled 2020-05-29: qty 1

## 2020-05-29 MED ORDER — KETOROLAC TROMETHAMINE 15 MG/ML IJ SOLN: 7.5000 mg | Freq: Three times a day (TID) | INTRAMUSCULAR | Status: AC | PRN

## 2020-05-29 MED ORDER — ONDANSETRON HCL 4 MG/2ML IJ SOLN: 4.0000 mg | Freq: Three times a day (TID) | INTRAMUSCULAR | Status: DC | PRN

## 2020-05-29 NOTE — Progress Notes (Signed)
PROGRESS NOTE  Brief Narrative: Anita Davis is a 35 y.o. female with a history of IVDU, hepatitis C, anxiety/depression, and gestational diabetes who presented to the ED initially 01/04/2020 with AMS, subsequently developed seizures with status epilepticus ultimately controlled by multiple AEDs. She continues to have waxing/waning mentation despite control of seizures and empiric treatment for autoimmune encephalitis with IVIG per neurology.   Since admission she has not had any further seizures so these medications were discontinued.  She has eventually awakened behaviors alternating between awake and appropriate, awake crying and depressed occasionally delusional, severe agitation and aggression with significant delusions.  She has been evaluated both by neurology and psychiatry who suspect a combination of sequelae from underlying brain injury as well as bipolar disorder with schizoaffective features.   Due to labile affect and violence/aggression, she has been kept under IVC pending placement at [facility].   Subjective: Having some stable aching back pain diffusely around mid to lower back. Not getting OOB very often. No urinary incontinence or numbness/weakness in legs. Nauseated this morning, has emesis bag and spitting into it during encounter. Denies abdominal pain. Having normal stools. No bleeding.  Objective: BP 102/68 (BP Location: Left Arm)   Pulse 64   Temp 98.2 F (36.8 C) (Oral)   Resp 17   Ht 5\' 5"  (1.651 m)   Wt 77.6 kg   SpO2 99%   BMI 28.45 kg/m   Gen: 34yo F appearing nauseated Pulm: Nonlabored and clear CV: RRR, vent rate in 60-70's.  GI: Soft, not tender, not distended, normoactive bowel sounds Neuro: Alert, oriented, no focal deficits MSK: No deformities of spine or paraspinal spasm. Psych: Calm, normal behavior. Not fully investigated today, though does not volunteer any delusions and has not appeared to have AVH at this time. Skin: No rashes or wounds  Assessment  & Plan: This 35yo F initially admitted for seizures, status epilepticus and acute encephalopathy now struggling with treatment-refractory psychosis and mood disorder being followed regularly by psychiatry continues to require inpatient psychiatric services and is pending transfer to [facility] pending bed availability (none available still). She remains under IVC (last updated 2/1) with stable medical findings.   Musculoskeletal back pain: No red flags on exam.  - Start toradol IV as pt is having some nausea with vomiting.   Nausea, vomiting: Unclear precipitant, abd benign.  - Zofran prn  Time spent: 25 minutes  [facility], MD Pager on amion 05/29/2020, 10:59 AM

## 2020-05-30 ENCOUNTER — Inpatient Hospital Stay (HOSPITAL_COMMUNITY): Payer: Medicaid Other

## 2020-05-30 MED ORDER — LACTULOSE 10 GM/15ML PO SOLN
10.0000 g | Freq: Every day | ORAL | Status: DC
  Administered 2020-05-30 – 2020-06-03 (×4): 10 g via ORAL
  Filled 2020-05-30 (×5): qty 15

## 2020-05-30 NOTE — Progress Notes (Addendum)
[facility] PROGRESS NOTE  Brixton Gallipeau TXM:[id number] DOB: November 05, 1985 DOA: 01/04/2020 PCP: [name], PA-C    02/10/20                      02/15/20                       04/08/20   Status: Remains inpatient appropriate because:Altered mental status, Unsafe d/c plan and Inpatient level of care appropriate due to severity of illness   Dispo: The patient is from: Home              Anticipated d/c is to: SNF              Anticipated d/c date is: > 3 days              Patient currently is medically stable to d/c.  Barriers to discharge: Significant aggressive and violent outbursts have improved w/ no episodes sine 1/24. Remains on priority list for [facility]. [facility] re contacted for admit given improved behaviors-this facility is ECT capable  **1/31: Chart has been reviewed by [facility] and given patient's underlying neurological condition i.e. previous history of status epilepticus she is not an appropriate candidate for ECT.**  **2/1: [facility] staff have reevaluated this patient.  She continues to require high level inpatient psychiatric care which is not able to be provided at [facility] behavioral health facilities.  Recommendation is to continue to pursue [facility] placement. **    Code Status: DNR Family Communication: Stepmother [name] 2/7-we discussed the likelihood that [name] would probably require lifelong institutionalization.  This determination will be likely made after she receives intensive psychiatric treatment at [facility] 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  Patient presented to [facility], ER in [place] several times prior to admission.  Initial presentation was related  to symptoms from acute cystitis.  Two other presentations related to insomnia.  On those occasions she was able to be discharged from the ER.  She returned again on 13 September being brought in by family for altered mental status.  After admission this patient developed seizure activity which progressed to status epilepticus therefore she was transferred to [facility] for further neurological evaluation.  After several days recurrent seizures were aborted with combination of 3 antiepileptic drugs.  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  Fortunately this did not improve patient's mentation and she continued to have waxing and waning episodes of alertness and encephalopathy.  Since admission she has not had any further seizures so these medications were discontinued.  She has eventually awakened behaviors alternating between awake and appropriate, awake crying and depressed occasionally delusional, severe agitation and aggression with significant delusions.  She has been evaluated both by neurology and psychiatry who suspect a combination of sequelae from underlying brain injury as well as bipolar disorder with schizoaffective features.  During one of her alert phases patient did admit to being diagnosed with bipolar disease prior to admission.  Patient is on multiple psychotropic medications continued to have episodes of violent outbursts.  She had required frequent doses of IM Geodon as well as Recruitment consultant and frequent  intervention by security staff during her episodes of severe agitation.  Recent chart review by [facility] states patient would not be a candidate for ECT given recent brain injury sequela as well as presentation with status epilepticus.  [name] had been remaining stable regarding extreme agitation and/or violent behaviors since 1/24 when she had received her last Geodon IM dosage.  On  the evening of 2/4 she was also given a dose of IM Geodon.  There was no chart documentation stating what type of behavior she was experiencing but typically this medication is only given if she has extreme agitation.  Subjective: Patient apparently had issues with abdominal pain and nausea over the weekend.  Currently as she is confused and depressed.  She is speaking in the third person responding to herself as [place].  She became frustrated over the weekend and early this morning and has thrown many of her belongings in the trash and has torn down many of the pictures on her wall.   Objective: Vitals:   05/29/20 1419 05/30/20 0501  BP: 108/63 104/66  Pulse: 68 62  Resp: 17 18  Temp: (!) 97.5 F (36.4 C) 98.6 F (37 C)  SpO2: 99% 100%    Intake/Output Summary (Last 24 hours) at 05/30/2020 0831 Last data filed at 05/29/2020 1616 Gross per 24 hour  Intake 150 ml  Output -  Net 150 ml   Filed Weights   05/22/20 0515 05/23/20 0445 05/25/20 0500  Weight: 73.6 kg 78.9 kg 77.6 kg    Exam: General: Alert, sad affect, minimally conversant Pulmonary:  Bilateral lung sounds clear to auscultation, stable on room air Cardiac:  Normal heart sounds, normotensive, skin warm and dry Abdomen: Obese, soft nontender nondistended.  LBM 2/5 Neurological:  Cranial nerves II through XII grossly intact, no focal neurological deficits and no gait disturbance Psychiatric: Awake and oriented x name and place.  Not to year.  Minimally conversant.  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 During periods of alertness and lucidity patient denied use of methamphetamines prior to admission although did admit to abruptly stopping her methadone -Continue max dose Geodon 80 mg BID, naltrexone, scheduled Klonopin, Tegretol, Celexa and Remeron at hour of sleep -1/28 increased Tegretol to 800 mg TID as of 1/29 with improvement in reports of severe anxiety.  LFTs remain stable with AST 70, ALT 63 with normal total bilirubin -Given recurrent worsening of depressive symptoms will decrease Klonopin back down to 1 mg 4 times daily.  If insomnia worsens can change nighttime dose to 2 mg. -Continue 1:1 safety sitter  -Appreciate assistance of psych team. It appears we have maxed out on all psychotropic medications for this patient.  It is noted that many of these medications will take weeks to months for full therapeutic benefit.  Given her persistent severe behavioral swings that include violent episodes of physically attacking staff it has been determined that patient would benefit from aggressive psychiatric treatment at [facility]  --Continue IM Geodon every 8-12 hours as needed severe agitation; as of  1/31 patient has not required any doses of IM Geodon since 1/24 when she required 2 doses -2/4 patient in great spirits, more alert and oriented and interactive with staff as documented above.  Again she has demonstrated wide-ranging emotions, ability to interact with staff and appropriateness that alternates with severe delusions and at times aggressive and violent behaviors.  Severe insomnia -Continue Remeron and trazodone   History of polysubstance abuse with heroin/history of outpatient methadone treatment -Prescribed methadone 60 mg daily from a clinic in [place] prior to admission -Continue naltrexone to treat heroin addiction as well as recent issues with  impulsivity and hypersexual behaviors during admission which are suspected related to brain injury and worsening psychiatric condition   Depression and severe anxiety disorder/PTSD/no bipolar disorder now with schizoaffective features -Extremes in behavior have significantly improved-continue IVC (last updated 2/1) -See above regarding pharmacotherapy  Recurrent constipation -Continue Colace 100 mg twice daily prn -Continue MiraLAX to twice daily -2/7 patient apparently had nausea and abdominal discomfort several days ago.  Patient affect and mentation prohibiting accurate assessment.  Abdominal films obtained today reveal diffuse stool throughout the colon and rectum consistent with constipation for we will add lactulose 10 gm daily and monitor for response    Other problems: Status epilepticus -Resolved -Suspect precipitated by abrupt withdrawal of methadone as reported by patient -Repeat MRI on 12/28 within normal limits and it is felt behavioral issues related to brain injury sequelae and chronic psychiatric condition.  Neurology has signed off -Vimpat, Dilantin and phenobarbital have been tapered and discontinued at recommendation of neurologist  Nonobstructive transaminitis in context of hepatitis C -Elevated HCV antibody with markedly elevated HCV RNA quantitative level  consistent with chronic hepatitis C  -LFTs remain stable without an obstructive pattern noted -11/10: Discussed with ID Dr. Manson PasseyMandahar. Treatment of hepatitis C typically is initiated in the outpatient setting. Medications can be quite expensive and usually take time to obtain insurance approval.  -ID recommends scheduling outpatient appointment their clinic closer to discharge date  Back pain 2/2 abnormal urinalysis consistent with UTI -Patient with low-grade fever overnight and is now having back pain for days not responsive to NSAIDs and muscle relaxers -DG lumbar spine unremarkable -During hospitalization  patient has been treated for pansensitive E. coli UTI as well as Salmonella UTI both of which have been sensitive to Levaquin -Completed Levaquin 12/20-urine culture obtained after antibiotics initiated and showed no growth -CT abdomen and pelvis on 12/17 unremarkable  Salmonella UTI -Completed amoxicillin  Abnormal urinalysis -Not consistent with UTI, was positive for moderate hemoglobin and greater than 50 RBCs with an elevated specific gravity -Suspect dehydration -Patient denies back pain or history of renal calculi although given her confusion unclear if this history is accurate  Physical deconditioning -Resolved -Does continue to exhibit impulsivity but maintains appropriate balance and gait no longer requires one-to-one safety sitter  Large hemorrhoids. -Resolved -Markedly improved-LD Proctofoam 12/15  Dysphagia -Resolved -Continue regular diet   Data Reviewed: Basic Metabolic Panel: No results for input(s): NA, K, CL, CO2, GLUCOSE, BUN, CREATININE, CALCIUM, MG, PHOS in the last 168 hours. Liver Function Tests: No results for input(s): AST, ALT, ALKPHOS, BILITOT, PROT, ALBUMIN in the last 168 hours. No results for input(s): LIPASE, AMYLASE in the last 168 hours. No results for input(s): AMMONIA in the last 168 hours. CBC: No results for input(s): WBC, NEUTROABS, HGB, HCT, MCV, PLT in the last 168 hours. Cardiac Enzymes: No results for input(s): CKTOTAL, CKMB, CKMBINDEX, TROPONINI in the last 168 hours.  BNP (last 3 results) No results for input(s): BNP in the last 8760 hours.  ProBNP (last 3 results) No results for input(s): PROBNP in the last 8760 hours.  CBG: No results for input(s): GLUCAP in the last 168 hours.  No results found for this or any previous visit (from the past 240 hour(s)).   Studies: No results found.  Scheduled Meds: . carbamazepine  800 mg Oral TID  . citalopram  20 mg Oral Daily  . clonazePAM  1 mg Oral Q6H  . cloNIDine  0.1 mg  Transdermal Weekly  . diclofenac Sodium  4 g Topical QID  . docusate sodium  100 mg Oral BID  . feeding supplement  237 mL Oral BID BM  . melatonin  10 mg Oral QHS  . mirtazapine  30 mg Oral QHS  . multivitamin with minerals  1 tablet Oral Daily  . naltrexone  25 mg Oral Daily  . polyethylene glycol  17 g Oral BID  . vitamin B-6  100 mg Oral Daily  . thiamine  100 mg Oral Daily  . traZODone  150 mg Oral QHS  . ziprasidone  80 mg Oral 2 times per day   Continuous Infusions:   Principal Problem:   Delirium due to multiple etiologies Active Problems:   Acute metabolic encephalopathy   Hypokalemia   Polysubstance abuse (HCC)   Elevated CK   Transaminitis   Refractory seizure (HCC)   Chronic hepatitis C without hepatic coma (HCC)   Distended abdomen   Palliative care encounter   Colonic Ileus (HCC)   Organic brain syndrome   On enteral nutrition   Physical deconditioning   Protein-calorie malnutrition (HCC)   Inadequate oral nutritional intake   Impulse disorder   Consultants:  Neurology  Psychiatry  Interventional radiology  Surgery    Procedures:  9/14 lumbar puncture  9/15 EEG  9/16 EEG  9/17 EEG  9/19 overnight EEG with video  9/22 overnight EEG with video with discontinuation of long-term EEG monitoring on 9/25  9/24 core track placement  10/6 EEG  12/15 PEG tube removed    Antibiotics: Anti-infectives (From admission, onward)   Start     Dose/Rate Route Frequency Ordered Stop   05/11/20 0845  amoxicillin-clavulanate (AUGMENTIN) 500-125 MG per tablet 500 mg        1 tablet Oral Every 8 hours 05/11/20 0750 05/18/20 0559   05/11/20 0845  metroNIDAZOLE (FLAGYL) tablet 500 mg        500 mg Oral Every 8 hours 05/11/20 0750 05/18/20 0559   04/05/20 1415  levofloxacin (LEVAQUIN) tablet 500 mg        500 mg Oral Daily 04/05/20 1327 04/11/20 0911   03/19/20 1000  metroNIDAZOLE (FLAGYL) tablet 500 mg  Status:  Discontinued        500 mg Per Tube  2 times daily 03/19/20 0822 03/23/20 0827   03/18/20 1200  amoxicillin (AMOXIL) 250 MG/5ML suspension 500 mg  Status:  Discontinued        500 mg Per Tube Every 8 hours 03/18/20 0915 03/23/20 0559   03/17/20 1200  cefTRIAXone (ROCEPHIN) 1 g in sodium chloride 0.9 % 100 mL IVPB  Status:  Discontinued        1 g 200 mL/hr over 30 Minutes Intravenous Every 24 hours 03/17/20 1048 03/18/20 0913   03/16/20 1130  metroNIDAZOLE (FLAGYL) 50 mg/ml oral suspension 500 mg  Status:  Discontinued        500 mg Per Tube  2 times daily 03/16/20 1040 03/19/20 0821   03/16/20 1130  fluconazole (DIFLUCAN) 40 MG/ML suspension 152 mg        150 mg Per Tube  Once 03/16/20 1040 03/16/20 1245   02/11/20 1526  ceFAZolin (ANCEF) IVPB 2g/100 mL premix        2 g 200 mL/hr over 30 Minutes Intravenous  Once 02/11/20 1526 02/11/20 1641   02/05/20 0600  ceFAZolin (ANCEF) IVPB 2g/100 mL premix        2 g 200 mL/hr over 30 Minutes Intravenous To Short Stay 02/04/20 1054 02/05/20 0950   01/29/20 0600  ceFAZolin (ANCEF) IVPB 2g/100 mL premix        2 g 200 mL/hr over 30 Minutes Intravenous On call to O.R. 01/28/20 1511 01/30/20 0559   01/28/20 2000  cefTRIAXone (ROCEPHIN) 1 g in sodium chloride 0.9 % 100 mL IVPB        1 g 200 mL/hr over 30 Minutes Intravenous Every 24 hours 01/28/20 1925 02/02/20 0724   01/06/20 0900  cefTRIAXone (ROCEPHIN) 2 g in sodium chloride 0.9 % 100 mL IVPB  Status:  Discontinued        2 g 200 mL/hr over 30 Minutes Intravenous Every 12 hours 01/06/20 0105 01/06/20 2202   01/06/20 0800  vancomycin (VANCOCIN) IVPB 1000 mg/200 mL premix  Status:  Discontinued       "Followed by" Linked Group Details   1,000 mg 200 mL/hr over 60 Minutes Intravenous Every 12 hours 01/05/20 1904 01/06/20 2202   01/05/20 2000  vancomycin (VANCOREADY) IVPB 1500 mg/300 mL       "Followed by" Linked Group Details   1,500 mg 150 mL/hr over 120 Minutes Intravenous  Once 01/05/20 1904 01/06/20 0127   01/05/20 1830   cefTRIAXone (ROCEPHIN) 2 g in sodium chloride 0.9 % 100 mL IVPB        2 g 200 mL/hr over 30 Minutes Intravenous  Once 01/05/20 1829 01/05/20 2220       Time spent: 25 minutes    [name] ANP  [facility] 7 am - 330 pm/M-F for direct patient care and secure chat Please see Amion for contact information 145 days

## 2020-05-31 MED ORDER — ONDANSETRON 4 MG PO TBDP
4.0000 mg | ORAL_TABLET | Freq: Three times a day (TID) | ORAL | Status: DC | PRN
  Administered 2020-05-31 – 2020-06-14 (×15): 4 mg via ORAL
  Filled 2020-05-31 (×15): qty 1

## 2020-05-31 NOTE — TOC Progression Note (Signed)
Transition of Care [facility]) - Progression Note    Patient Details  Name: Jezreel Sisk MRN: [id number] Date of Birth: 18-May-1985  [facility] CM/SW [place], RN Phone Number: 05/31/2020, 2:05 PM  Clinical Narrative:    Case management met with the patient at the bedside for transitions of care.  I called [facility] admissions at 0930 am this morning and spoke with [facility], RNCM.  Patient remains active on the priority list for [facility] with no available beds open for admission at this point.   IVC paperwork was updated and signed by Dr. Manuella Ghazi and Towanda Octave, MSW to follow up with Magistrate's office and place updated paperwork in chart once completed.  CM and MSW will continue to follow for admission at [facility] once bed is available.   Expected Discharge Plan: [place] Barriers to Discharge: Continued Medical Work up,Inadequate or no insurance,Active Substance Use - Placement,SNF Pending payor source - LOG,SNF Pending Medicaid,SNF Pending bed offer  Expected Discharge Plan and Services Expected Discharge Plan: [place] In-house Referral: Development worker, community   Post Acute Care Choice: [place] Living arrangements for the past 2 months: Mobile Home                                       Social Determinants of Health (SDOH) Interventions    Readmission Risk Interventions No flowsheet data found.

## 2020-05-31 NOTE — Progress Notes (Addendum)
[facility] PROGRESS NOTE  Simrit Gohlke CNO:[id number] DOB: Jul 09, 1985 DOA: 01/04/2020 PCP: [name], PA-C    02/10/20                      02/15/20                       04/08/20   Status: Remains inpatient appropriate because:Altered mental status, Unsafe d/c plan and Inpatient level of care appropriate due to severity of illness   Dispo: The patient is from: Home              [facility]              Anticipated d/c date is: > 3 days              Patient currently is medically stable to d/c.  Barriers to discharge: Significant aggressive and violent outbursts have improved w/ no episodes sine 1/24. Remains on priority list for [facility]. [facility] re contacted for admit given improved behaviors-this facility is ECT capable  **1/31: Chart has been reviewed by [facility] and given patient's underlying neurological condition i.e. previous history of status epilepticus she is not an appropriate candidate for ECT.**  **2/1: [facility] staff have reevaluated this patient.  She continues to require high level inpatient psychiatric care which is not able to be provided at [facility] behavioral health facilities.  Recommendation is to continue to pursue [facility] placement. **    Code Status: DNR Family Communication: Stepmother [name] 2/7-we discussed the likelihood that Anita Davis would probably require lifelong institutionalization.  This determination will be likely made after she receives intensive psychiatric treatment at [facility]. 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  Patient presented to [facility] ER in [place] several times prior to admission.   Initial presentation was related to symptoms from acute cystitis.  Two other presentations related to insomnia.  On those occasions she was able to be discharged from the ER.  She returned again on 13 September being brought in by family for altered mental status.  After admission this patient developed seizure activity which progressed to status epilepticus therefore she was transferred to [facility] for further neurological evaluation.  After several days recurrent seizures were aborted with combination of 3 antiepileptic drugs.  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  Fortunately this did not improve patient's mentation and she continued to have waxing and waning episodes of alertness and encephalopathy.  Since admission she has not had any further seizures so these medications were discontinued.  She has eventually awakened behaviors alternating between awake and appropriate, awake crying and depressed occasionally delusional, severe agitation and aggression with significant delusions.  She has been evaluated both by neurology and psychiatry who suspect a combination of sequelae from underlying brain injury as well as bipolar disorder with schizoaffective features.  During one of her alert phases patient did admit to being diagnosed with bipolar disease prior to admission.  Patient is on multiple psychotropic medications continued to have episodes of violent outbursts.  She had required frequent doses of IM Geodon as well as safety  sitter and frequent intervention by security staff during her episodes of severe agitation.  Recent chart review by [facility] states patient would not be a candidate for ECT given recent brain injury sequela as well as presentation with status epilepticus.  Anita Davis had been remaining stable regarding extreme agitation and/or violent behaviors since 1/24 when she had  received her last Geodon IM dose.  On the evening of 2/4 she was also given a dose of IM Geodon.  There was no chart documentation stating what type of behavior she was experiencing but typically this medication is only given if she has extreme agitation.  Subjective: Patient laying on her left side.  Reporting nausea.  States has been given Zofran.  Denies abdominal pain or urinary symptoms.   Objective: Vitals:   05/30/20 2045 05/31/20 0500  BP: 100/68 101/77  Pulse: 98 100  Resp: 18 20  Temp: 98.2 F (36.8 C) 98.2 F (36.8 C)  SpO2: 98% 98%    Intake/Output Summary (Last 24 hours) at 05/31/2020 0813 Last data filed at 05/30/2020 1649 Gross per 24 hour  Intake 300 ml  Output --  Net 300 ml   Filed Weights   05/22/20 0515 05/23/20 0445 05/25/20 0500  Weight: 73.6 kg 78.9 kg 77.6 kg    Exam: General: Calm, mild distress as evidenced by significant nausea Pulmonary:  Lungs clear, stable on room air Cardiac:  Heart sounds are normal, pulses regular, extremities warm to touch Abdomen: Abdomen soft nondistended and nontender with normoactive bowel sounds.  LBM 2/5 of note abdominal x-ray on 2/7 revealed significant stool burden consistent with constipation Neurological:  Cranial nerves II through XII grossly intact, no focal neurological deficits and no gait disturbance Psychiatric: Awake but sleepy and reporting nausea.  Oriented x3.  Not delusional.     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 During  periods of alertness and lucidity patient denied use of methamphetamines prior to admission although did admit to abruptly stopping her methadone -Continue max dose Geodon 80 mg BID, naltrexone, scheduled Klonopin, Tegretol, Celexa and Remeron at hour of sleep -Continue Tegretol to 800 mg TID as of 1/29 with improvement in reports of severe anxiety.  LFTs remain stable with AST 70, ALT 63 with normal total bilirubin -Given recurrent worsening of depressive symptoms will decrease Klonopin back down to 1 mg 4 times daily.  If insomnia worsens can change nighttime dose to 2 mg. -Continue 1:1 safety sitter  -Appreciate assistance of psych team. It appears we have maxed out on all psychotropic medications for this patient.  It is noted that many of these medications will take weeks to months for full therapeutic benefit.  Given her persistent severe behavioral swings that include violent episodes of physically attacking staff it has been determined that patient would benefit from aggressive psychiatric treatment at [facility]  --Continue IM Geodon every 8-12 hours as needed severe agitation -2/4 patient in great spirits, more alert and oriented and interactive with staff as documented above.  Again she has demonstrated wide-ranging emotions, ability to interact with staff and appropriateness that alternates with severe delusions and at times aggressive and violent behaviors.  Severe insomnia -Continue Remeron and trazodone   History of polysubstance abuse with heroin/history of outpatient methadone treatment -Prescribed methadone 60 mg daily from a clinic in [place] prior to admission -Continue naltrexone to treat heroin addiction as well as recent issues with impulsivity and hypersexual behaviors during admission which are suspected related to brain injury and worsening psychiatric condition   Depression and severe anxiety disorder/PTSD/no bipolar disorder now with schizoaffective features -Extremes in  behavior have significantly improved-continue IVC (last updated 2/8) -See above regarding pharmacotherapy  Recurrent constipation/nausea -Continue Colace 100 mg twice daily prn -Continue MiraLAX to twice daily -2/7 patient apparently had nausea and abdominal discomfort several days ago.  Patient affect and mentation prohibiting accurate assessment.  Abdominal films obtained today reveal diffuse stool throughout the colon and rectum consistent with constipation for we will add lactulose 10 gm daily and monitor for response -As of 9 AM this morning patient has received 2 doses of lactulose he documented bowel movement -if symptoms of nausea persist on 2/9 may need to repeat catheterized urinalysis with culture since patient does have this symptom as well as back pain (which she was also reporting over the weekend) previous UTI    Other problems: Status epilepticus -Resolved -Suspect precipitated by abrupt withdrawal of methadone as reported by patient -Repeat MRI on 12/28 within normal limits and it is felt behavioral issues related to brain injury sequelae and chronic psychiatric condition.  Neurology has signed off -Vimpat, Dilantin and phenobarbital have been tapered and discontinued at recommendation of neurologist  Nonobstructive transaminitis in context of hepatitis C -Elevated HCV antibody with markedly elevated HCV RNA quantitative level  consistent with chronic hepatitis C  -LFTs remain stable without an obstructive pattern noted -11/10: Discussed with ID Dr. Manson Passey. Treatment of hepatitis C typically is initiated in the outpatient setting. Medications can be quite expensive and usually take time to obtain insurance approval.  -ID recommends scheduling outpatient appointment their clinic closer to discharge date  Back pain 2/2 abnormal urinalysis consistent with UTI -Patient with low-grade fever overnight and is now having back pain for days not responsive to NSAIDs and muscle  relaxers -DG lumbar spine unremarkable -During hospitalization patient has been treated for pansensitive E. coli UTI as well as Salmonella UTI both of which have been sensitive to Levaquin -Completed Levaquin 12/20-urine culture obtained after antibiotics initiated and showed no growth -CT abdomen and pelvis on 12/17 unremarkable  Salmonella UTI -Completed amoxicillin  Abnormal urinalysis -Not consistent with UTI, was positive for moderate hemoglobin and greater than 50 RBCs with an elevated specific gravity -Suspect dehydration -Patient denies back pain or history of renal calculi although given her confusion unclear if this history is accurate  Physical deconditioning -Resolved -Does continue to exhibit impulsivity but maintains appropriate balance and gait no longer requires one-to-one safety sitter  Large hemorrhoids. -Resolved -Markedly improved-LD Proctofoam 12/15  Dysphagia -Resolved -Continue regular diet   Data Reviewed: Basic Metabolic Panel: No results for input(s): NA, K, CL, CO2, GLUCOSE, BUN, CREATININE, CALCIUM, MG, PHOS in the last 168 hours. Liver Function Tests: No results for input(s): AST, ALT, ALKPHOS, BILITOT, PROT, ALBUMIN in the last 168 hours. No results for input(s): LIPASE, AMYLASE in the last 168 hours. No results for input(s): AMMONIA in the last 168 hours. CBC: No results for input(s): WBC, NEUTROABS, HGB, HCT, MCV, PLT in the last 168 hours. Cardiac Enzymes: No results for input(s): CKTOTAL, CKMB, CKMBINDEX, TROPONINI in the last 168 hours. BNP (last 3 results) No results for  input(s): BNP in the last 8760 hours.  ProBNP (last 3 results) No results for input(s): PROBNP in the last 8760 hours.  CBG: No results for input(s): GLUCAP in the last 168 hours.  No results found for this or any previous visit (from the past 240 hour(s)).   Studies: DG Abd 2 Views  Result Date: 05/30/2020 CLINICAL DATA:  Constipation with nausea and vomiting  EXAM: ABDOMEN - 2 VIEW COMPARISON:  April 27, 2020. FINDINGS: Frontal and left lateral decubitus images were obtained. There is diffuse stool throughout the colon and rectal regions. There is no bowel dilatation or air-fluid level suggesting bowel obstruction. No free air. Visualized lung bases are clear. Probable small phleboliths in the pelvis. IMPRESSION: Diffuse stool throughout colon and rectum, an appearance indicative of constipation. No bowel obstruction or free air evident. Electronically Signed   By: Bretta Bang III M.D.   On: 05/30/2020 09:34    Scheduled Meds: . carbamazepine  800 mg Oral TID  . citalopram  20 mg Oral Daily  . clonazePAM  1 mg Oral Q6H  . cloNIDine  0.1 mg Transdermal Weekly  . diclofenac Sodium  4 g Topical QID  . docusate sodium  100 mg Oral BID  . feeding supplement  237 mL Oral BID BM  . lactulose  10 g Oral Daily  . melatonin  10 mg Oral QHS  . mirtazapine  30 mg Oral QHS  . multivitamin with minerals  1 tablet Oral Daily  . naltrexone  25 mg Oral Daily  . polyethylene glycol  17 g Oral BID  . vitamin B-6  100 mg Oral Daily  . thiamine  100 mg Oral Daily  . traZODone  150 mg Oral QHS  . ziprasidone  80 mg Oral 2 times per day   Continuous Infusions:   Principal Problem:   Delirium due to multiple etiologies Active Problems:   Acute metabolic encephalopathy   Hypokalemia   Polysubstance abuse (HCC)   Elevated CK   Transaminitis   Refractory seizure (HCC)   Chronic hepatitis C without hepatic coma (HCC)   Distended abdomen   Palliative care encounter   Colonic Ileus (HCC)   Organic brain syndrome   On enteral nutrition   Physical deconditioning   Protein-calorie malnutrition (HCC)   Inadequate oral nutritional intake   Impulse disorder   Consultants:  Neurology  Psychiatry  Interventional radiology  Surgery    Procedures:  9/14 lumbar puncture  9/15 EEG  9/16 EEG  9/17 EEG  9/19 overnight EEG with video  9/22  overnight EEG with video with discontinuation of long-term EEG monitoring on 9/25  9/24 core track placement  10/6 EEG  12/15 PEG tube removed    Antibiotics: Anti-infectives (From admission, onward)   Start     Dose/Rate Route Frequency Ordered Stop   05/11/20 0845  amoxicillin-clavulanate (AUGMENTIN) 500-125 MG per tablet 500 mg        1 tablet Oral Every 8 hours 05/11/20 0750 05/18/20 0559   05/11/20 0845  metroNIDAZOLE (FLAGYL) tablet 500 mg        500 mg Oral Every 8 hours 05/11/20 0750 05/18/20 0559   04/05/20 1415  levofloxacin (LEVAQUIN) tablet 500 mg        500 mg Oral Daily 04/05/20 1327 04/11/20 0911   03/19/20 1000  metroNIDAZOLE (FLAGYL) tablet 500 mg  Status:  Discontinued        500 mg Per Tube 2 times daily 03/19/20 0822 03/23/20 0827  03/18/20 1200  amoxicillin (AMOXIL) 250 MG/5ML suspension 500 mg  Status:  Discontinued        500 mg Per Tube Every 8 hours 03/18/20 0915 03/23/20 0559   03/17/20 1200  cefTRIAXone (ROCEPHIN) 1 g in sodium chloride 0.9 % 100 mL IVPB  Status:  Discontinued        1 g 200 mL/hr over 30 Minutes Intravenous Every 24 hours 03/17/20 1048 03/18/20 0913   03/16/20 1130  metroNIDAZOLE (FLAGYL) 50 mg/ml oral suspension 500 mg  Status:  Discontinued        500 mg Per Tube 2 times daily 03/16/20 1040 03/19/20 0821   03/16/20 1130  fluconazole (DIFLUCAN) 40 MG/ML suspension 152 mg        150 mg Per Tube  Once 03/16/20 1040 03/16/20 1245   02/11/20 1526  ceFAZolin (ANCEF) IVPB 2g/100 mL premix        2 g 200 mL/hr over 30 Minutes Intravenous  Once 02/11/20 1526 02/11/20 1641   02/05/20 0600  ceFAZolin (ANCEF) IVPB 2g/100 mL premix        2 g 200 mL/hr over 30 Minutes Intravenous To Short Stay 02/04/20 1054 02/05/20 0950   01/29/20 0600  ceFAZolin (ANCEF) IVPB 2g/100 mL premix        2 g 200 mL/hr over 30 Minutes Intravenous On call to O.R. 01/28/20 1511 01/30/20 0559   01/28/20 2000  cefTRIAXone (ROCEPHIN) 1 g in sodium chloride 0.9 % 100 mL  IVPB        1 g 200 mL/hr over 30 Minutes Intravenous Every 24 hours 01/28/20 1925 02/02/20 0724   01/06/20 0900  cefTRIAXone (ROCEPHIN) 2 g in sodium chloride 0.9 % 100 mL IVPB  Status:  Discontinued        2 g 200 mL/hr over 30 Minutes Intravenous Every 12 hours 01/06/20 0105 01/06/20 2202   01/06/20 0800  vancomycin (VANCOCIN) IVPB 1000 mg/200 mL premix  Status:  Discontinued       "Followed by" Linked Group Details   1,000 mg 200 mL/hr over 60 Minutes Intravenous Every 12 hours 01/05/20 1904 01/06/20 2202   01/05/20 2000  vancomycin (VANCOREADY) IVPB 1500 mg/300 mL       "Followed by" Linked Group Details   1,500 mg 150 mL/hr over 120 Minutes Intravenous  Once 01/05/20 1904 01/06/20 0127   01/05/20 1830  cefTRIAXone (ROCEPHIN) 2 g in sodium chloride 0.9 % 100 mL IVPB        2 g 200 mL/hr over 30 Minutes Intravenous  Once 01/05/20 1829 01/05/20 2220       Time spent: 25 minutes    [name] ANP  [facility] 7 am - 330 pm/M-F for direct patient care and secure chat Please see Amion for contact information 146 days

## 2020-06-01 NOTE — Progress Notes (Signed)
[facility] PROGRESS NOTE  Anita Davis CNO:[id number] DOB: Jul 09, 1985 DOA: 01/04/2020 PCP: [name], PA-C    02/10/20                      02/15/20                       04/08/20   Status: Remains inpatient appropriate because:Altered mental status, Unsafe d/c plan and Inpatient level of care appropriate due to severity of illness   Dispo: The patient is from: Home              [facility]              Anticipated d/c date is: > 3 days              Patient currently is medically stable to d/c.  Barriers to discharge: Significant aggressive and violent outbursts have improved w/ no episodes sine 1/24. Remains on priority list for [facility]. [facility] re contacted for admit given improved behaviors-this facility is ECT capable  **1/31: Chart has been reviewed by [facility] and given patient's underlying neurological condition i.e. previous history of status epilepticus she is not an appropriate candidate for ECT.**  **2/1: [facility] staff have reevaluated this patient.  She continues to require high level inpatient psychiatric care which is not able to be provided at [facility] behavioral health facilities.  Recommendation is to continue to pursue [facility] placement. **    Code Status: DNR Family Communication: Stepmother [name] 2/7-we discussed the likelihood that Anita Davis would probably require lifelong institutionalization.  This determination will be likely made after she receives intensive psychiatric treatment at [facility]. 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  Patient presented to [facility] ER in [place] several times prior to admission.   Initial presentation was related to symptoms from acute cystitis.  Two other presentations related to insomnia.  On those occasions she was able to be discharged from the ER.  She returned again on 13 September being brought in by family for altered mental status.  After admission this patient developed seizure activity which progressed to status epilepticus therefore she was transferred to [facility] for further neurological evaluation.  After several days recurrent seizures were aborted with combination of 3 antiepileptic drugs.  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  Fortunately this did not improve patient's mentation and she continued to have waxing and waning episodes of alertness and encephalopathy.  Since admission she has not had any further seizures so these medications were discontinued.  She has eventually awakened behaviors alternating between awake and appropriate, awake crying and depressed occasionally delusional, severe agitation and aggression with significant delusions.  She has been evaluated both by neurology and psychiatry who suspect a combination of sequelae from underlying brain injury as well as bipolar disorder with schizoaffective features.  During one of her alert phases patient did admit to being diagnosed with bipolar disease prior to admission.  Patient is on multiple psychotropic medications continued to have episodes of violent outbursts.  She had required frequent doses of IM Geodon as well as safety  sitter and frequent intervention by security staff during her episodes of severe agitation.  [facility] states patient would not be a candidate for ECT given recent brain injury sequela as well as presentation with status epilepticus.  Anita Davis**Anita Davis had been remaining stable regarding extreme agitation and/or violent behaviors since 1/24 when she had  received her last Geodon IM dose.  On the evening of 2/4 she was also given a dose of IM Geodon.  There was no chart documentation stating what type of behavior she was experiencing but typically this medication is only given if she has extreme agitation.  Subjective: Awake but will not communicate verbally with me.  Does make eye contact.  Does not report any abdominal pain or nausea when asked nor symptoms reproducible on physical exam of abdomen.   Objective: Vitals:   05/31/20 2000 06/01/20 0600  BP: 111/87 100/72  Pulse: 89 92  Resp: 18 18  Temp: 98.2 F (36.8 C) 98.2 F (36.8 C)  SpO2: 98% 98%    Intake/Output Summary (Last 24 hours) at 06/01/2020 0817 Last data filed at 06/01/2020 [id number] Gross per 24 hour  Intake 720 ml  Output --  Net 720 ml   Filed Weights   05/22/20 0515 05/23/20 0445 05/25/20 0500  Weight: 73.6 kg 78.9 kg 77.6 kg    Exam: General: Awake but not interactive and will not respond verbally Pulmonary:  Lungs clear anteriorly, stable on room air Cardiac:  Normal heart sounds, pulse regular, extremities warm to touch Abdomen: Slightly obese, nondistended nontender with hyperactive bowel sounds.  LBM 2/8 Neurological:  Cranial nerves II through XII grossly intact, no focal neurological deficits and no gait disturbance Psychiatric: Awake, responds to name but will not verbally communicate.  Make eye contact.  Appears to be oriented to name only.   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  During periods of alertness and lucidity patient denied use of methamphetamines prior to admission although did admit to abruptly stopping her methadone -Continue max dose Geodon 80 mg BID, naltrexone, scheduled Klonopin, Tegretol, Celexa and Remeron at hour of sleep -Continue Tegretol to 800 mg TID as of 1/29 with improvement in reports of severe anxiety.  LFTs remain stable with AST 70, ALT 63 with normal total bilirubin -Given recurrent worsening of depressive symptoms will decrease Klonopin back down to 1 mg 4 times daily.  If insomnia worsens can change nighttime dose to 2 mg. -Continue 1:1 safety sitter  -Appreciate assistance of psych team. It appears we have maxed out on all psychotropic medications for this patient.  It is noted that many of these medications will take weeks to months for full therapeutic benefit.  Given her persistent severe behavioral swings that include violent episodes of physically attacking staff it has been determined that patient would benefit from aggressive psychiatric treatment [facility]  --Continue IM Geodon every 8-12 hours as needed severe agitation -2/4 patient in great spirits, more alert and oriented and interactive with staff as documented above.  Again she has demonstrated wide-ranging emotions, ability to interact with staff and appropriateness that alternates with severe delusions and at times  aggressive and violent behaviors.  Severe insomnia -Continue Remeron and trazodone   History of polysubstance abuse with heroin/history of outpatient methadone treatment -Prescribed methadone 60 mg daily from a clinic in [place] prior to admission -Continue naltrexone to treat heroin addiction as well as recent issues with impulsivity and hypersexual behaviors during admission which are suspected related to brain injury and worsening psychiatric condition   Depression and severe anxiety disorder/PTSD/no bipolar disorder now with schizoaffective features -Extremes  in behavior have significantly improved-continue IVC (last updated 2/8) -See above regarding pharmacotherapy  Recurrent constipation/nausea -Continue Colace 100 mg twice daily prn -Continue MiraLAX to twice daily -2/7 patient apparently had nausea and abdominal discomfort several days ago.  Patient affect and mentation prohibiting accurate assessment.  Abdominal films obtained today reveal diffuse stool throughout the colon and rectum consistent with constipation for we will add lactulose 10 gm daily and monitor for response -As of 9 AM this morning patient has received 2 doses of lactulose he documented bowel movement -if symptoms of nausea persist on 2/9 may need to repeat catheterized urinalysis with culture since patient does have this symptom as well as back pain (which she was also reporting over the weekend) previous UTI    Other problems: Status epilepticus -Resolved -Suspect precipitated by abrupt withdrawal of methadone as reported by patient -Repeat MRI on 12/28 within normal limits and it is felt behavioral issues related to brain injury sequelae and chronic psychiatric condition.  Neurology has signed off -Vimpat, Dilantin and phenobarbital have been tapered and discontinued at recommendation of neurologist  Nonobstructive transaminitis in context of hepatitis C -Elevated HCV antibody with markedly elevated HCV RNA quantitative level  consistent with chronic hepatitis C  -LFTs remain stable without an obstructive pattern noted -11/10: Discussed with ID Dr. Manson Passey. Treatment of hepatitis C typically is initiated in the outpatient setting. Medications can be quite expensive and usually take time to obtain insurance approval.  -ID recommends scheduling outpatient appointment their clinic closer to discharge date  Back pain 2/2 abnormal urinalysis consistent with UTI -Patient with low-grade fever overnight and is now having back pain for days not responsive to NSAIDs and  muscle relaxers -DG lumbar spine unremarkable -During hospitalization patient has been treated for pansensitive E. coli UTI as well as Salmonella UTI both of which have been sensitive to Levaquin -Completed Levaquin 12/20-urine culture obtained after antibiotics initiated and showed no growth -CT abdomen and pelvis on 12/17 unremarkable  Salmonella UTI -Completed amoxicillin  Abnormal urinalysis -Not consistent with UTI, was positive for moderate hemoglobin and greater than 50 RBCs with an elevated specific gravity -Suspect dehydration -Patient denies back pain or history of renal calculi although given her confusion unclear if this history is accurate  Physical deconditioning -Resolved -Does continue to exhibit impulsivity but maintains appropriate balance and gait no longer requires one-to-one safety sitter  Large hemorrhoids. -Resolved -Markedly improved-LD Proctofoam 12/15  Dysphagia -Resolved -Continue regular diet   Data Reviewed: Basic Metabolic Panel: No results for input(s): NA, K, CL, CO2, GLUCOSE, BUN, CREATININE, CALCIUM, MG, PHOS in the last 168 hours. Liver Function Tests: No results for input(s): AST, ALT, ALKPHOS, BILITOT, PROT, ALBUMIN in the last 168 hours. No results for input(s): LIPASE, AMYLASE in the last 168 hours. No results for input(s): AMMONIA in the last 168 hours. CBC: No results for input(s): WBC, NEUTROABS, HGB, HCT, MCV, PLT in the last 168 hours. Cardiac Enzymes: No results for input(s): CKTOTAL, CKMB, CKMBINDEX, TROPONINI in the last 168 hours. BNP (last  3 results) No results for input(s): BNP in the last 8760 hours.  ProBNP (last 3 results) No results for input(s): PROBNP in the last 8760 hours.  CBG: No results for input(s): GLUCAP in the last 168 hours.  No results found for this or any previous visit (from the past 240 hour(s)).   Studies: DG Abd 2 Views  Result Date: 05/30/2020 CLINICAL DATA:  Constipation with nausea and  vomiting EXAM: ABDOMEN - 2 VIEW COMPARISON:  April 27, 2020. FINDINGS: Frontal and left lateral decubitus images were obtained. There is diffuse stool throughout the colon and rectal regions. There is no bowel dilatation or air-fluid level suggesting bowel obstruction. No free air. Visualized lung bases are clear. Probable small phleboliths in the pelvis. IMPRESSION: Diffuse stool throughout colon and rectum, an appearance indicative of constipation. No bowel obstruction or free air evident. Electronically Signed   By: Bretta Bang III M.D.   On: 05/30/2020 09:34    Scheduled Meds: . carbamazepine  800 mg Oral TID  . citalopram  20 mg Oral Daily  . clonazePAM  1 mg Oral Q6H  . cloNIDine  0.1 mg Transdermal Weekly  . diclofenac Sodium  4 g Topical QID  . docusate sodium  100 mg Oral BID  . feeding supplement  237 mL Oral BID BM  . lactulose  10 g Oral Daily  . melatonin  10 mg Oral QHS  . mirtazapine  30 mg Oral QHS  . multivitamin with minerals  1 tablet Oral Daily  . naltrexone  25 mg Oral Daily  . polyethylene glycol  17 g Oral BID  . vitamin B-6  100 mg Oral Daily  . thiamine  100 mg Oral Daily  . traZODone  150 mg Oral QHS  . ziprasidone  80 mg Oral 2 times per day   Continuous Infusions:   Principal Problem:   Delirium due to multiple etiologies Active Problems:   Acute metabolic encephalopathy   Hypokalemia   Polysubstance abuse (HCC)   Elevated CK   Transaminitis   Refractory seizure (HCC)   Chronic hepatitis C without hepatic coma (HCC)   Distended abdomen   Palliative care encounter   Colonic Ileus (HCC)   Organic brain syndrome   On enteral nutrition   Physical deconditioning   Protein-calorie malnutrition (HCC)   Inadequate oral nutritional intake   Impulse disorder   Consultants:  Neurology  Psychiatry  Interventional radiology  Surgery    Procedures:  9/14 lumbar puncture  9/15 EEG  9/16 EEG  9/17 EEG  9/19 overnight EEG with  video  9/22 overnight EEG with video with discontinuation of long-term EEG monitoring on 9/25  9/24 core track placement  10/6 EEG  12/15 PEG tube removed    Antibiotics: Anti-infectives (From admission, onward)   Start     Dose/Rate Route Frequency Ordered Stop   05/11/20 0845  amoxicillin-clavulanate (AUGMENTIN) 500-125 MG per tablet 500 mg        1 tablet Oral Every 8 hours 05/11/20 0750 05/18/20 0559   05/11/20 0845  metroNIDAZOLE (FLAGYL) tablet 500 mg        500 mg Oral Every 8 hours 05/11/20 0750 05/18/20 0559   04/05/20 1415  levofloxacin (LEVAQUIN) tablet 500 mg        500 mg Oral Daily 04/05/20 1327 04/11/20 0911   03/19/20 1000  metroNIDAZOLE (FLAGYL) tablet 500 mg  Status:  Discontinued        500 mg Per Tube 2 times daily  03/19/20 0822 03/23/20 0827   03/18/20 1200  amoxicillin (AMOXIL) 250 MG/5ML suspension 500 mg  Status:  Discontinued        500 mg Per Tube Every 8 hours 03/18/20 0915 03/23/20 0559   03/17/20 1200  cefTRIAXone (ROCEPHIN) 1 g in sodium chloride 0.9 % 100 mL IVPB  Status:  Discontinued        1 g 200 mL/hr over 30 Minutes Intravenous Every 24 hours 03/17/20 1048 03/18/20 0913   03/16/20 1130  metroNIDAZOLE (FLAGYL) 50 mg/ml oral suspension 500 mg  Status:  Discontinued        500 mg Per Tube 2 times daily 03/16/20 1040 03/19/20 0821   03/16/20 1130  fluconazole (DIFLUCAN) 40 MG/ML suspension 152 mg        150 mg Per Tube  Once 03/16/20 1040 03/16/20 1245   02/11/20 1526  ceFAZolin (ANCEF) IVPB 2g/100 mL premix        2 g 200 mL/hr over 30 Minutes Intravenous  Once 02/11/20 1526 02/11/20 1641   02/05/20 0600  ceFAZolin (ANCEF) IVPB 2g/100 mL premix        2 g 200 mL/hr over 30 Minutes Intravenous To Short Stay 02/04/20 1054 02/05/20 0950   01/29/20 0600  ceFAZolin (ANCEF) IVPB 2g/100 mL premix        2 g 200 mL/hr over 30 Minutes Intravenous On call to O.R. 01/28/20 1511 01/30/20 0559   01/28/20 2000  cefTRIAXone (ROCEPHIN) 1 g in sodium chloride  0.9 % 100 mL IVPB        1 g 200 mL/hr over 30 Minutes Intravenous Every 24 hours 01/28/20 1925 02/02/20 0724   01/06/20 0900  cefTRIAXone (ROCEPHIN) 2 g in sodium chloride 0.9 % 100 mL IVPB  Status:  Discontinued        2 g 200 mL/hr over 30 Minutes Intravenous Every 12 hours 01/06/20 0105 01/06/20 2202   01/06/20 0800  vancomycin (VANCOCIN) IVPB 1000 mg/200 mL premix  Status:  Discontinued       "Followed by" Linked Group Details   1,000 mg 200 mL/hr over 60 Minutes Intravenous Every 12 hours 01/05/20 1904 01/06/20 2202   01/05/20 2000  vancomycin (VANCOREADY) IVPB 1500 mg/300 mL       "Followed by" Linked Group Details   1,500 mg 150 mL/hr over 120 Minutes Intravenous  Once 01/05/20 1904 01/06/20 0127   01/05/20 1830  cefTRIAXone (ROCEPHIN) 2 g in sodium chloride 0.9 % 100 mL IVPB        2 g 200 mL/hr over 30 Minutes Intravenous  Once 01/05/20 1829 01/05/20 2220       Time spent: 25 minutes    [name] ANP  [facility] 7 am - 330 pm/M-F for direct patient care and secure chat Please see Amion for contact information 147 days

## 2020-06-02 NOTE — Progress Notes (Addendum)
CSW spoke with Deanna at [facility] who states there are no beds available today and that patient is still on the priority waiting list.  CSW sent patient's interim guardianship paperwork to [place] to be sent to the [facility] to support the disability application.  Edwin Dada, MSW, LCSW Transitions of Care  Clinical Social Worker II [phone]

## 2020-06-02 NOTE — Progress Notes (Signed)
[facility] PROGRESS NOTE  Anita Davis HQI:[id number] DOB: [birth year] DOA: 01/04/2020 PCP: [name], PA-C    02/10/20                      02/15/20                       04/08/20    04/01/21  Status: Remains inpatient appropriate because:Altered mental status, Unsafe d/c plan and Inpatient level of care appropriate due to severity of illness   Dispo: The patient is from: Home              [facility]              Anticipated d/c date is: > 3 days              Patient currently is medically stable to d/c.  Barriers to discharge: Significant aggressive and violent outbursts have improved w/ no episodes sine 1/24. Remains on priority list for [facility]. [facility] re contacted for admit given improved behaviors-this facility is ECT capable  **1/31: Chart has been reviewed by [facility] and given patient's underlying neurological condition i.e. previous history of status epilepticus she is not an appropriate candidate for ECT.**  **2/1: [facility] staff have reevaluated this patient.  She continues to require high level inpatient psychiatric care which is not able to be provided at [facility] behavioral health facilities.  Recommendation is to continue to pursue [facility] placement. **    Code Status: DNR Family Communication: Stepmother Anita Davis 2/7-we discussed the likelihood that Anita Davis would probably require lifelong institutionalization.  This determination will be likely made after she receives intensive psychiatric treatment at [facility]. 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  Patient presented to [facility], ER in [place] several times prior  to admission.  Initial presentation was related to symptoms from acute cystitis.  Two other presentations related to insomnia.  On those occasions she was able to be discharged from the ER.  She returned again on 13 September being brought in by family for altered mental status.  After admission this patient developed seizure activity which progressed to status epilepticus therefore she was transferred to [facility] for further neurological evaluation.  After several days recurrent seizures were aborted with combination of 3 antiepileptic drugs.  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  Fortunately this did not improve patient's mentation and she continued to have waxing and waning episodes of alertness and encephalopathy.  Since admission she has not had any further seizures so these medications were discontinued.  She has eventually awakened behaviors alternating between awake and appropriate, awake crying and depressed occasionally delusional, severe agitation and aggression with significant delusions.  She has been evaluated both by neurology and psychiatry who suspect a combination of sequelae from underlying brain injury as well as bipolar disorder with schizoaffective features.  During one of her alert phases patient did admit to being diagnosed with bipolar disease prior to admission.  Patient is on multiple psychotropic medications continued to have episodes of violent outbursts.  She had required frequent doses of IM Geodon as  well as Recruitment consultantsafety sitter and frequent intervention by security staff during her episodes of severe agitation.  Recent [facility] review [facility] states patient would not be a candidate for ECT given recent brain injury sequela as well as presentation with status epilepticus.  Anita Davis**[name] had been remaining stable regarding extreme agitation and/or violent behaviors since 1/24 when  she had received her last Geodon IM dose.  On the evening of 2/4 she was also given a dose of IM Geodon.  There was no chart documentation stating what type of behavior she was experiencing but typically this medication is only given if she has extreme agitation.  Subjective: Awake.  Sad affect with delayed response to questions asked.  Will interact and make eye contact today.  States continues to have issues with hard small stools despite starting lactulose.  Denies any urinary symptoms.  Denies abdominal pain.  States has a generalized feeling of unwellness.  Informed her I wanted to take a picture to center her stepmother Anita QuinLinda and asked her to smile and she did so.  See picture above.  Currently she is sitting up in the bed doing crafts.   Objective: Vitals:   06/01/20 1426 06/02/20 0600  BP: 105/63 103/68  Pulse: 65 (!) 57  Resp: 16 20  Temp: 98.3 F (36.8 C) (!) 97.5 F (36.4 C)  SpO2: 99% 97%    Intake/Output Summary (Last 24 hours) at 06/02/2020 0810 Last data filed at 06/02/2020 [id number] Gross per 24 hour  Intake 390 ml  Output --  Net 390 ml   Filed Weights   05/23/20 0445 05/25/20 0500 06/01/20 1233  Weight: 78.9 kg 77.6 kg 78.8 kg    Exam: General: Awake, with depressed affect, no acute distress although is reporting a sensation of feeling unwell Pulmonary:  Posterior lung sounds clear and she is stable on room air Cardiac:  Normal heart sounds, extremities warm to touch, pulses regular Abdomen: Soft nontender nondistended.  LBM 2/8 Neurological:  Cranial nerves II through XII grossly intact, no focal neurological deficits and no gait disturbance Psychiatric: Affect.  She will make eye contact and after some delay will respond to questions asked.  Alert and oriented x3 today.   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 During periods of alertness and lucidity patient denied use of methamphetamines prior to admission although did admit to abruptly stopping her methadone -Continue max dose Geodon 80 mg BID, naltrexone, scheduled Klonopin, Tegretol, Celexa and Remeron at hour of sleep -Continue Tegretol to 800 mg TID as of 1/29 with improvement in reports of severe anxiety.  LFTs remain stable with AST 70, ALT 63 with normal total bilirubin -Given recurrent worsening of depressive symptoms will decrease Klonopin back down to 1 mg 4 times daily.  If insomnia worsens can change nighttime dose to 2 mg. -Continue 1:1 safety sitter  -Appreciate assistance of psych team. It appears we have maxed out on all psychotropic medications for this patient.  It is noted that many of these medications will take weeks to months for full therapeutic benefit.  Given her persistent severe behavioral swings that include violent episodes of physically  attacking staff it has been determined that patient would benefit from aggressive psychiatric treatment at [facility]  --Continue IM Geodon every 8-12 hours as needed severe agitation -2/4 patient in great spirits, more alert and oriented and interactive with staff as documented above.  Again she has demonstrated wide-ranging emotions, ability to interact with staff and appropriateness that alternates with severe delusions and at times aggressive and violent behaviors.  Severe insomnia -Continue Remeron and trazodone   History of polysubstance abuse with heroin/history of outpatient methadone treatment -Prescribed methadone 60 mg daily from a clinic in [place] prior to  admission -Continue naltrexone to treat heroin addiction as well as recent issues with impulsivity and hypersexual behaviors during admission which are suspected related to brain injury and worsening psychiatric condition   Depression and severe anxiety disorder/PTSD/no bipolar disorder now with schizoaffective features -Extremes in behavior have significantly improved-continue IVC (last updated 2/8) -See above regarding pharmacotherapy  Recurrent constipation/nausea -Continue Colace 100 mg twice daily prn -Continue MiraLAX to twice daily -2/7 patient apparently had nausea and abdominal discomfort several days ago.  Patient affect and mentation prohibiting accurate assessment.  Abdominal films obtained today reveal diffuse stool throughout the colon and rectum consistent with constipation for we will add lactulose 10 gm daily and monitor for response -As of 9 AM this morning patient has received 2 doses of lactulose he documented bowel movement -if symptoms of nausea persist on 2/9 may need to repeat catheterized urinalysis with culture since patient does have this symptom as well as back pain (which she was also reporting over the weekend) previous UTI    Other problems: Status epilepticus -Resolved -Suspect precipitated by abrupt withdrawal of methadone as reported by patient -Repeat MRI on 12/28 within normal limits and it is felt behavioral issues related to brain injury sequelae and chronic psychiatric condition.  Neurology has signed off -Vimpat, Dilantin and phenobarbital have been tapered and discontinued at recommendation of neurologist  Nonobstructive transaminitis in context of hepatitis C -Elevated HCV antibody with markedly elevated HCV RNA quantitative level  consistent with chronic hepatitis C  -LFTs remain stable without an obstructive pattern noted -11/10: Discussed with ID Dr. Manson Passey. Treatment of hepatitis C typically is initiated in the outpatient setting.  Medications can be quite expensive and usually take time to obtain insurance approval.  -ID recommends scheduling outpatient appointment their clinic closer to discharge date  Back pain 2/2 abnormal urinalysis consistent with UTI -Patient with low-grade fever overnight and is now having back pain for days not responsive to NSAIDs and muscle relaxers -DG lumbar spine unremarkable -During hospitalization patient has been treated for pansensitive E. coli UTI as well as Salmonella UTI both of which have been sensitive to Levaquin -Completed Levaquin 12/20-urine culture obtained after antibiotics initiated and showed no growth -CT abdomen and pelvis on 12/17 unremarkable  Salmonella UTI -Completed amoxicillin  Abnormal urinalysis -Not consistent with UTI, was positive for moderate hemoglobin and greater than 50 RBCs with an elevated specific gravity -Suspect dehydration -Patient denies back pain or history of renal calculi although given her confusion unclear if this history is accurate  Physical deconditioning -Resolved -Does continue to exhibit impulsivity but maintains appropriate balance and gait no longer requires one-to-one safety sitter  Large hemorrhoids. -Resolved -Markedly improved-LD Proctofoam 12/15  Dysphagia -Resolved -Continue regular diet   Data Reviewed: Basic Metabolic Panel: No results for input(s): NA, K, CL, CO2, GLUCOSE, BUN, CREATININE, CALCIUM, MG, PHOS in the last 168 hours. Liver Function Tests: No results for input(s):  AST, ALT, ALKPHOS, BILITOT, PROT, ALBUMIN in the last 168 hours. No results for input(s): LIPASE, AMYLASE in the last 168 hours. No results for input(s): AMMONIA in the last 168 hours. CBC: No results for input(s): WBC, NEUTROABS, HGB, HCT, MCV, PLT in the last 168 hours. Cardiac Enzymes: No results for input(s): CKTOTAL, CKMB, CKMBINDEX, TROPONINI in the last 168 hours. BNP (last 3 results) No results for input(s): BNP in the last  8760 hours.  ProBNP (last 3 results) No results for input(s): PROBNP in the last 8760 hours.  CBG: No results for input(s): GLUCAP in the last 168 hours.  No results found for this or any previous visit (from the past 240 hour(s)).   Studies: No results found.  Scheduled Meds: . carbamazepine  800 mg Oral TID  . citalopram  20 mg Oral Daily  . clonazePAM  1 mg Oral Q6H  . cloNIDine  0.1 mg Transdermal Weekly  . diclofenac Sodium  4 g Topical QID  . docusate sodium  100 mg Oral BID  . feeding supplement  237 mL Oral BID BM  . lactulose  10 g Oral Daily  . melatonin  10 mg Oral QHS  . mirtazapine  30 mg Oral QHS  . multivitamin with minerals  1 tablet Oral Daily  . naltrexone  25 mg Oral Daily  . polyethylene glycol  17 g Oral BID  . vitamin B-6  100 mg Oral Daily  . thiamine  100 mg Oral Daily  . traZODone  150 mg Oral QHS  . ziprasidone  80 mg Oral 2 times per day   Continuous Infusions:   Principal Problem:   Delirium due to multiple etiologies Active Problems:   Acute metabolic encephalopathy   Hypokalemia   Polysubstance abuse (HCC)   Elevated CK   Transaminitis   Refractory seizure (HCC)   Chronic hepatitis C without hepatic coma (HCC)   Distended abdomen   Palliative care encounter   Colonic Ileus (HCC)   Organic brain syndrome   On enteral nutrition   Physical deconditioning   Protein-calorie malnutrition (HCC)   Inadequate oral nutritional intake   Impulse disorder   Consultants:  Neurology  Psychiatry  Interventional radiology  Surgery    Procedures:  9/14 lumbar puncture  9/15 EEG  9/16 EEG  9/17 EEG  9/19 overnight EEG with video  9/22 overnight EEG with video with discontinuation of long-term EEG monitoring on 9/25  9/24 core track placement  10/6 EEG  12/15 PEG tube removed    Antibiotics: Anti-infectives (From admission, onward)   Start     Dose/Rate Route Frequency Ordered Stop   05/11/20 0845   amoxicillin-clavulanate (AUGMENTIN) 500-125 MG per tablet 500 mg        1 tablet Oral Every 8 hours 05/11/20 0750 05/18/20 0559   05/11/20 0845  metroNIDAZOLE (FLAGYL) tablet 500 mg        500 mg Oral Every 8 hours 05/11/20 0750 05/18/20 0559   04/05/20 1415  levofloxacin (LEVAQUIN) tablet 500 mg        500 mg Oral Daily 04/05/20 1327 04/11/20 0911   03/19/20 1000  metroNIDAZOLE (FLAGYL) tablet 500 mg  Status:  Discontinued        500 mg Per Tube 2 times daily 03/19/20 0822 03/23/20 0827   03/18/20 1200  amoxicillin (AMOXIL) 250 MG/5ML suspension 500 mg  Status:  Discontinued        500 mg Per Tube Every 8 hours 03/18/20 0915 03/23/20 0559  03/17/20 1200  cefTRIAXone (ROCEPHIN) 1 g in sodium chloride 0.9 % 100 mL IVPB  Status:  Discontinued        1 g 200 mL/hr over 30 Minutes Intravenous Every 24 hours 03/17/20 1048 03/18/20 0913   03/16/20 1130  metroNIDAZOLE (FLAGYL) 50 mg/ml oral suspension 500 mg  Status:  Discontinued        500 mg Per Tube 2 times daily 03/16/20 1040 03/19/20 0821   03/16/20 1130  fluconazole (DIFLUCAN) 40 MG/ML suspension 152 mg        150 mg Per Tube  Once 03/16/20 1040 03/16/20 1245   02/11/20 1526  ceFAZolin (ANCEF) IVPB 2g/100 mL premix        2 g 200 mL/hr over 30 Minutes Intravenous  Once 02/11/20 1526 02/11/20 1641   02/05/20 0600  ceFAZolin (ANCEF) IVPB 2g/100 mL premix        2 g 200 mL/hr over 30 Minutes Intravenous To Short Stay 02/04/20 1054 02/05/20 0950   01/29/20 0600  ceFAZolin (ANCEF) IVPB 2g/100 mL premix        2 g 200 mL/hr over 30 Minutes Intravenous On call to O.R. 01/28/20 1511 01/30/20 0559   01/28/20 2000  cefTRIAXone (ROCEPHIN) 1 g in sodium chloride 0.9 % 100 mL IVPB        1 g 200 mL/hr over 30 Minutes Intravenous Every 24 hours 01/28/20 1925 02/02/20 0724   01/06/20 0900  cefTRIAXone (ROCEPHIN) 2 g in sodium chloride 0.9 % 100 mL IVPB  Status:  Discontinued        2 g 200 mL/hr over 30 Minutes Intravenous Every 12 hours 01/06/20  0105 01/06/20 2202   01/06/20 0800  vancomycin (VANCOCIN) IVPB 1000 mg/200 mL premix  Status:  Discontinued       "Followed by" Linked Group Details   1,000 mg 200 mL/hr over 60 Minutes Intravenous Every 12 hours 01/05/20 1904 01/06/20 2202   01/05/20 2000  vancomycin (VANCOREADY) IVPB 1500 mg/300 mL       "Followed by" Linked Group Details   1,500 mg 150 mL/hr over 120 Minutes Intravenous  Once 01/05/20 1904 01/06/20 0127   01/05/20 1830  cefTRIAXone (ROCEPHIN) 2 g in sodium chloride 0.9 % 100 mL IVPB        2 g 200 mL/hr over 30 Minutes Intravenous  Once 01/05/20 1829 01/05/20 2220       Time spent: 25 minutes    [name] ANP  [facility] 7 am - 330 pm/M-F for direct patient care and secure chat Please see Amion for contact information 148 days

## 2020-06-03 LAB — URINALYSIS, ROUTINE W REFLEX MICROSCOPIC
Bilirubin Urine: NEGATIVE
Glucose, UA: NEGATIVE mg/dL
Hgb urine dipstick: NEGATIVE
Ketones, ur: NEGATIVE mg/dL
Leukocytes,Ua: NEGATIVE
Nitrite: NEGATIVE
Protein, ur: NEGATIVE mg/dL
Specific Gravity, Urine: 1.014 (ref 1.005–1.030)
pH: 7 (ref 5.0–8.0)

## 2020-06-03 MED ORDER — LACTULOSE 10 GM/15ML PO SOLN: 10.0000 g | Freq: Every day | ORAL | Status: DC | PRN

## 2020-06-03 NOTE — Progress Notes (Signed)
CSW spoke with Ron at [facility] who states there are no beds available today and that patient is still on the priority waiting list.  Edwin Dada, MSW, LCSW Transitions of Care  Clinical Social Worker II [phone]

## 2020-06-03 NOTE — Progress Notes (Signed)
[facility] PROGRESS NOTE  Anita Davis UJW:[id number] DOB: 03/11/1986 DOA: 01/04/2020 PCP: [name], PA-C    02/10/20                      02/15/20                       04/08/20    04/01/21  Status: Remains inpatient appropriate because:Altered mental status, Unsafe d/c plan and Inpatient level of care appropriate due to severity of illness   Dispo: The patient is from: Home              [facility]              Anticipated d/c date is: > 3 days              Patient currently is medically stable to d/c.  Barriers to discharge: Significant aggressive and violent outbursts have improved w/ no episodes sine 1/24. Remains on priority list for [facility]. [facility] re contacted for admit given improved behaviors-this facility is ECT capable  **1/31: Chart has been reviewed by [facility] and given patient's underlying neurological condition i.e. previous history of status epilepticus she is not an appropriate candidate for ECT.**  **2/1: [facility] staff have reevaluated this patient.  She continues to require high level inpatient psychiatric care which is not able to be provided at [facility] behavioral health facilities.  Recommendation is to continue to pursue [facility] placement. 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  Patient presented to [facility], ER in [place] several times prior to admission.  Initial presentation was related to symptoms from acute cystitis.  Two other presentations related to insomnia.  On those occasions she was able to be discharged from the ER.   She returned again on 13 September being brought in by family for altered mental status.  After admission this patient developed seizure activity which progressed to status epilepticus therefore she was transferred to [facility] for further neurological evaluation.  After several days recurrent seizures were aborted with combination of 3 antiepileptic drugs.  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  Fortunately this did not improve patient's mentation and she continued to have waxing and waning episodes of alertness and encephalopathy.  Since admission she has not had any further seizures so these medications were discontinued.  She has eventually awakened behaviors alternating between awake and appropriate, awake crying and depressed occasionally delusional, severe agitation and aggression with significant delusions.  She has been evaluated both by neurology and psychiatry who suspect a combination of sequelae from underlying brain injury as well as bipolar disorder with schizoaffective features.  During one of her alert phases patient did admit to being diagnosed with bipolar disease prior to admission.  Patient is on multiple psychotropic medications continued to have episodes of violent outbursts.  She had required frequent doses of IM Geodon as well as Recruitment consultant and frequent intervention by security staff during her episodes of severe agitation.  Recent chart review by [facility] states patient would  not be a candidate for ECT given recent brain injury sequela as well as presentation with status epilepticus.  [name] had been remaining stable regarding extreme agitation and/or violent behaviors since 1/24 when she had received her last Geodon IM dose.  On the evening of 2/4 she was also given a dose of IM Geodon.  There was no chart documentation stating what type of behavior she was experiencing  but typically this medication is only given if she has extreme agitation.  Subjective: Reviewed continues to report nausea.  Denies abdominal pain or urinary symptoms states no longer feels constipated and has not had any further small hard stools.   Objective: Vitals:   06/02/20 2300 06/03/20 0300  BP: 102/77 102/66  Pulse: 97 88  Resp: 18 20  Temp: 98 F (36.7 C) 97.7 F (36.5 C)  SpO2: 99% 99%    Intake/Output Summary (Last 24 hours) at 06/03/2020 0840 Last data filed at 06/02/2020 1700 Gross per 24 hour  Intake 720 ml  Output --  Net 720 ml   Filed Weights   05/25/20 0500 06/01/20 1233 06/03/20 0300  Weight: 77.6 kg 78.8 kg 78.3 kg    Exam: General: Awake, Flat depressed affect, mild distress in regards to ongoing nausea Pulmonary:  Posterior lungs clear to auscultation, stable on room air without any increased work of breathing Cardiac:  Heart sounds S1-S2, pulse regular nontachycardic, extremities warm to touch Abdomen: Abdomen soft nontender nondistended with normal active bowel sounds.   LBM 2/10 Neurological:  Cranial nerves II through XII grossly intact, no focal neurological deficits and no gait disturbance Psychiatric: Awake, oriented x3, flat depressed affect.   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 During periods of alertness and lucidity patient denied use of methamphetamines prior to admission although did admit to abruptly stopping her methadone -Continue max dose Geodon 80 mg BID,  naltrexone, scheduled Klonopin, Tegretol, Celexa and Remeron at hour of sleep -Continue Tegretol to 800 mg TID as of 1/29 with improvement in reports of severe anxiety.  LFTs remain stable with AST 70, ALT 63 with normal total bilirubin -Given recurrent worsening of depressive symptoms will decrease Klonopin back down to 1 mg 4 times daily.  If insomnia worsens can change nighttime dose to 2 mg. -Continue 1:1 safety sitter  -Appreciate assistance of psych team. It appears we have maxed out on all psychotropic medications for this patient.  It is noted that many of these medications will take weeks to months for full therapeutic benefit.  Given her persistent severe behavioral swings that include violent episodes of physically attacking staff it has been determined that patient would benefit from aggressive psychiatric treatment at [facility]  --Continue IM Geodon every 8-12 hours as needed severe agitation -2/4 patient in great spirits, more alert and oriented and interactive with staff as documented above.  Again she has demonstrated wide-ranging emotions, ability to interact with staff and appropriateness that alternates with severe delusions and at times aggressive and violent behaviors.  Severe insomnia -Continue Remeron and trazodone   History of polysubstance abuse with heroin/history of outpatient methadone treatment -Prescribed methadone 60 mg daily from a  clinic in [place] prior to admission -Continue naltrexone to treat heroin addiction as well as recent issues with impulsivity and hypersexual behaviors during admission which are suspected related to brain injury and worsening psychiatric condition   Depression and severe anxiety disorder/PTSD/no bipolar disorder now with schizoaffective features -Extremes in behavior have significantly improved-continue IVC (last updated 2/8) -See above regarding pharmacotherapy  Recurrent constipation/nausea -Continue Colace 100 mg twice daily  prn -Continue MiraLAX to twice daily - has had multiple bowel movements since initiation of scheduled lactulose-lactulose could be contributing to ongoing nausea therefore will change to prn -As a precaution with her history of recurrent UTI will check urinalysis and culture to ensure nausea not secondary to UTI    Other problems: Status epilepticus -Resolved -Suspect precipitated by abrupt withdrawal of methadone as reported by patient -Repeat MRI on 12/28 within normal limits and it is felt behavioral issues related to brain injury sequelae and chronic psychiatric condition.  Neurology has signed off -Vimpat, Dilantin and phenobarbital have been tapered and discontinued at recommendation of neurologist  Nonobstructive transaminitis in context of hepatitis C -Elevated HCV antibody with markedly elevated HCV RNA quantitative level  consistent with chronic hepatitis C  -LFTs remain stable without an obstructive pattern noted -11/10: Discussed with ID Dr. Manson Passey. Treatment of hepatitis C typically is initiated in the outpatient setting. Medications can be quite expensive and usually take time to obtain insurance approval.  -ID recommends scheduling outpatient appointment their clinic closer to discharge date  Back pain 2/2 abnormal urinalysis consistent with UTI -Patient with low-grade fever overnight and is now having back pain for days not responsive to NSAIDs and muscle relaxers -DG lumbar spine unremarkable -During hospitalization patient has been treated for pansensitive E. coli UTI as well as Salmonella UTI both of which have been sensitive to Levaquin -Completed Levaquin 12/20-urine culture obtained after antibiotics initiated and showed no growth -CT abdomen and pelvis on 12/17 unremarkable  Salmonella UTI -Completed amoxicillin  Abnormal urinalysis -Not consistent with UTI, was positive for moderate hemoglobin and greater than 50 RBCs with an elevated specific  gravity -Suspect dehydration -Patient denies back pain or history of renal calculi although given her confusion unclear if this history is accurate  Physical deconditioning -Resolved -Does continue to exhibit impulsivity but maintains appropriate balance and gait no longer requires one-to-one safety sitter  Large hemorrhoids. -Resolved -Markedly improved-LD Proctofoam 12/15  Dysphagia -Resolved -Continue regular diet   Data Reviewed: Basic Metabolic Panel: No results for input(s): NA, K, CL, CO2, GLUCOSE, BUN, CREATININE, CALCIUM, MG, PHOS in the last 168 hours. Liver Function Tests: No results for input(s): AST, ALT, ALKPHOS, BILITOT, PROT, ALBUMIN in the last 168 hours. No results for input(s): LIPASE, AMYLASE in the last 168 hours. No results for input(s): AMMONIA in the last 168 hours. CBC: No results for input(s): WBC, NEUTROABS, HGB, HCT, MCV, PLT in the last 168 hours. Cardiac Enzymes: No results for input(s): CKTOTAL, CKMB, CKMBINDEX, TROPONINI in the last 168 hours. BNP (last 3 results) No results for input(s): BNP in the last 8760 hours.  ProBNP (last 3 results) No results for input(s): PROBNP in the last 8760 hours.  CBG: No results for input(s): GLUCAP in the last 168 hours.  No results found for this or any previous visit (from the past 240 hour(s)).   Studies: No results found.  Scheduled Meds: . carbamazepine  800 mg Oral TID  . citalopram  20 mg Oral Daily  . clonazePAM  1 mg Oral Q6H  .  cloNIDine  0.1 mg Transdermal Weekly  . diclofenac Sodium  4 g Topical QID  . docusate sodium  100 mg Oral BID  . feeding supplement  237 mL Oral BID BM  . lactulose  10 g Oral Daily  . melatonin  10 mg Oral QHS  . mirtazapine  30 mg Oral QHS  . multivitamin with minerals  1 tablet Oral Daily  . naltrexone  25 mg Oral Daily  . polyethylene glycol  17 g Oral BID  . vitamin B-6  100 mg Oral Daily  . thiamine  100 mg Oral Daily  . traZODone  150 mg Oral QHS   . ziprasidone  80 mg Oral 2 times per day   Continuous Infusions:   Principal Problem:   Delirium due to multiple etiologies Active Problems:   Acute metabolic encephalopathy   Hypokalemia   Polysubstance abuse (HCC)   Elevated CK   Transaminitis   Refractory seizure (HCC)   Chronic hepatitis C without hepatic coma (HCC)   Distended abdomen   Palliative care encounter   Colonic Ileus (HCC)   Organic brain syndrome   On enteral nutrition   Physical deconditioning   Protein-calorie malnutrition (HCC)   Inadequate oral nutritional intake   Impulse disorder   Consultants:  Neurology  Psychiatry  Interventional radiology  Surgery    Procedures:  9/14 lumbar puncture  9/15 EEG  9/16 EEG  9/17 EEG  9/19 overnight EEG with video  9/22 overnight EEG with video with discontinuation of long-term EEG monitoring on 9/25  9/24 core track placement  10/6 EEG  12/15 PEG tube removed    Antibiotics: Anti-infectives (From admission, onward)   Start     Dose/Rate Route Frequency Ordered Stop   05/11/20 0845  amoxicillin-clavulanate (AUGMENTIN) 500-125 MG per tablet 500 mg        1 tablet Oral Every 8 hours 05/11/20 0750 05/18/20 0559   05/11/20 0845  metroNIDAZOLE (FLAGYL) tablet 500 mg        500 mg Oral Every 8 hours 05/11/20 0750 05/18/20 0559   04/05/20 1415  levofloxacin (LEVAQUIN) tablet 500 mg        500 mg Oral Daily 04/05/20 1327 04/11/20 0911   03/19/20 1000  metroNIDAZOLE (FLAGYL) tablet 500 mg  Status:  Discontinued        500 mg Per Tube 2 times daily 03/19/20 0822 03/23/20 0827   03/18/20 1200  amoxicillin (AMOXIL) 250 MG/5ML suspension 500 mg  Status:  Discontinued        500 mg Per Tube Every 8 hours 03/18/20 0915 03/23/20 0559   03/17/20 1200  cefTRIAXone (ROCEPHIN) 1 g in sodium chloride 0.9 % 100 mL IVPB  Status:  Discontinued        1 g 200 mL/hr over 30 Minutes Intravenous Every 24 hours 03/17/20 1048 03/18/20 0913   03/16/20 1130   metroNIDAZOLE (FLAGYL) 50 mg/ml oral suspension 500 mg  Status:  Discontinued        500 mg Per Tube 2 times daily 03/16/20 1040 03/19/20 0821   03/16/20 1130  fluconazole (DIFLUCAN) 40 MG/ML suspension 152 mg        150 mg Per Tube  Once 03/16/20 1040 03/16/20 1245   02/11/20 1526  ceFAZolin (ANCEF) IVPB 2g/100 mL premix        2 g 200 mL/hr over 30 Minutes Intravenous  Once 02/11/20 1526 02/11/20 1641   02/05/20 0600  ceFAZolin (ANCEF) IVPB 2g/100 mL premix  2 g 200 mL/hr over 30 Minutes Intravenous To Short Stay 02/04/20 1054 02/05/20 0950   01/29/20 0600  ceFAZolin (ANCEF) IVPB 2g/100 mL premix        2 g 200 mL/hr over 30 Minutes Intravenous On call to O.R. 01/28/20 1511 01/30/20 0559   01/28/20 2000  cefTRIAXone (ROCEPHIN) 1 g in sodium chloride 0.9 % 100 mL IVPB        1 g 200 mL/hr over 30 Minutes Intravenous Every 24 hours 01/28/20 1925 02/02/20 0724   01/06/20 0900  cefTRIAXone (ROCEPHIN) 2 g in sodium chloride 0.9 % 100 mL IVPB  Status:  Discontinued        2 g 200 mL/hr over 30 Minutes Intravenous Every 12 hours 01/06/20 0105 01/06/20 2202   01/06/20 0800  vancomycin (VANCOCIN) IVPB 1000 mg/200 mL premix  Status:  Discontinued       "Followed by" Linked Group Details   1,000 mg 200 mL/hr over 60 Minutes Intravenous Every 12 hours 01/05/20 1904 01/06/20 2202   01/05/20 2000  vancomycin (VANCOREADY) IVPB 1500 mg/300 mL       "Followed by" Linked Group Details   1,500 mg 150 mL/hr over 120 Minutes Intravenous  Once 01/05/20 1904 01/06/20 0127   01/05/20 1830  cefTRIAXone (ROCEPHIN) 2 g in sodium chloride 0.9 % 100 mL IVPB        2 g 200 mL/hr over 30 Minutes Intravenous  Once 01/05/20 1829 01/05/20 2220       Time spent: 25 minutes    [name] ANP  [facility] 7 am - 330 pm/M-F for direct patient care and secure chat Please see Amion for contact information 149 days

## 2020-06-03 NOTE — Consult Note (Signed)
[facility] Face-to-Face Psychiatry Consult   Reason for Consult: Reassessment for ongoing psychiatric inpatient Referring Physician:  Dr. Jarvis Newcomer Patient Identification: Anita Davis MRN:  [id number] Principal Diagnosis: Delirium due to multiple etiologies Diagnosis:  Principal Problem:   Delirium due to multiple etiologies Active Problems:   Acute metabolic encephalopathy   Hypokalemia   Polysubstance abuse (HCC)   Elevated CK   Transaminitis   Refractory seizure (HCC)   Chronic hepatitis C without hepatic coma (HCC)   Distended abdomen   Palliative care encounter   Colonic Ileus (HCC)   Organic brain syndrome   On enteral nutrition   Physical deconditioning   Protein-calorie malnutrition (HCC)   Inadequate oral nutritional intake   Impulse disorder   Total Time spent with patient: 45 minutes  Subjective:   Anita Davis is a 35 y.o. female patient with a history of polysubstance use disorder, who presented to [facility] in Sep with encephalopathy. EEG showed status epilepticus arising from the left central region. She was treated with Keppra, Vimpat and Dilantin and her mental status reportedly improved. She was transferred to [facility] for further work up.  Per chart review she continues to have various fluctuation in her mental status, as noted "demonstrates wide ranging emotions". Her behaviors and moods fluctuate from euthymic, sad, raging, violent, and aggressive. She continues to exhibit rapid cycling, and unpredictable behaviors.  On evaluation today patient is alert and oriented, she is observed to be resting well and states "Im very tired today but good. " She sat up in bed and acknowledged Clinical research associate, she states "it is almost time for my anxiety medicine, and for me to order lunch. " She rates her anxiety 4/10 with 10 being the worse on likert scale.  She does not appear to be responding to internal/external stimuli or preoccupied. She denies any suicidal thoughts, homicidal  thoughts and or hallucinations.    Patient is alert and oriented, able to tell me her name, date of birth, and while she remains in the hospital at this time. On evaluation patient is found in her room sitting upright eating chocolate ice cream.  She originally was interacting with this nurse practitioner, although she did exhibit some delusional thinking she was able to have a linear conversation.  Immediately after discussing her children she developed a blank stare and became nonverbal.  At that time she wished to end the evaluation.  HP/synopsis: Please see full note from [name] nurse practitioner, and has also been following this patient throughout her duration of stay of 115 days.  Since admission patient has had varying levels of awakeness and orientation as well as labile mood and affect.  Essentially for the first 8 weeks of hospitalization patient would have brief episodes of being awake, sometimes oriented sometimes not.  These episodes would last for several minutes to several hours but were never sustained.  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  Multiple psychotropic medications were tried without success or caused significant side effects and were discontinued.  Decision made to taper and wean all psychotropic drugs.  After this was done patient became extremely alert and agitated.  She was very impulsive and was demonstrating hypersexual behavior as well as extreme mood swings.  Seroquel tried initially to manage brain injury sequela but required high dosages unfortunately contributing to patient's depression and suicidal ideation.  It was noted that each time patient was given IM Geodon that she became alert and for  the most part oriented, was able to consistently communicate and perform ADLs.  Eventually she was transitioned to oral Geodon.  Naltrexone had been started to assist with underlying hypersexual behaviors and impulsivity and  to treat her underlying heroin addiction since she had not had any methadone or heroin since admission.  She has also been placed on Klonopin and clonidine to aid with her severe anxiety.  Recently patient has had issues with extreme agitation around the holidays and was acting out with wandering behaviors, spitting on staff, using profanity in the hallways and fighting with security staff when restraints were applied.  Some of her behaviors have been manipulative especially in regards to attempting to obtain drugs such as benzodiazepines or narcotics.  Neurology was reconsulted.  Repeat MRI unremarkable and it is felt current behaviors are combination of brain injury sequela as well as underlying psychiatric issues from chronic drug abuse.  Recently patient has become focused on obtaining methadone.  Typically when she becomes anxious or restless she request some sort of drug: " I need a hot shot" (Ambien), " I need something for my anxiety I cannot take it anymore"; and most recently she is focused on obtaining methadone.  She remains confused and alternates between sad and depressed or angry and defiant.  The patient was observed to be in the bed in a deep sleep.  She continues to have a one-on-one sitter for her safety, as she continues to have periods of alertness, followed by some catatonia, followed by aggression and combativeness.  As noted in chart patient has had multiple failed attempts at multiple psychotropic medication to include first and second generation antipsychotic, multiple mood stabilizers, and several trials of benzodiazepines.  There have been multiple attempts to wean her off of the benzodiazepines, however patient has notable agitation and aggression and is a concern for withdrawal syndrome.  At the time of this evaluation patient is currently taking it carbamazepine 600 mg p.o. 3 times daily, clonazepam 1.5 mg p.o. every 6 hours, Escitalopram 20 mg p.o. nightly, mirtazapine 30 mg p.o.  nightly, and ziprasidone 80 mg p.o. twice daily.   Past Psychiatric History: Please see initial evaluation for full details. I have reviewed the history. No updates at this time.   Risk to Self:  yes Risk to Others:  yes Prior Inpatient Therapy:  see initial eval. No update Prior Outpatient Therapy:  see initial eval. No update  Past Medical History:  Past Medical History:  Diagnosis Date  . Allergy   . Bronchitis   . Hepatitis-C   . History of gestational diabetes   . HPV in female   . Substance abuse [facility])     Past Surgical History:  Procedure Laterality Date  . HEMORRHOID BANDING  age 27  . IR GASTROSTOMY TUBE REMOVAL  04/06/2020  . IR REPLC GASTRO/COLONIC TUBE PERCUT W/FLUORO  02/11/2020  . LAPAROSCOPIC GASTROSTOMY N/A 02/05/2020   Procedure: LAPAROSCOPIC GASTROSTOMY PLACEMENT;  Surgeon: Gaynelle Adu, MD;  Location[facility] OR;  Service: General;  Laterality: N/A;   Family History:  Family History  Problem Relation Age of Onset  . Depression Mother   . Cervical cancer Mother   . Hypertension Father   . Diabetes Father    Family Psychiatric  History: Please see initial evaluation for full details. I have reviewed the history. No updates at this time.    Social History:  Social History   Substance and Sexual Activity  Alcohol Use No     Social History  Substance and Sexual Activity  Drug Use Not Currently  . Types: IV, Cocaine, Hydrocodone, Oxycodone, Marijuana, Heroin   Comment: takes methadone- now taking subutex (03/27/19)    Social History   Socioeconomic History  . Marital status: Single    Spouse name: Not on file  . Number of children: Not on file  . Years of education: Not on file  . Highest education level: Not on file  Occupational History  . Not on file  Tobacco Use  . Smoking status: Current Every Day Smoker    Years: 24.00    Types: Cigarettes, Cigars  . Smokeless tobacco: Never Used  . Tobacco comment: 1 cigar/daily. former 1 pack  cigarette smoker quit 05/2019  currently smokes CBD  Vaping Use  . Vaping Use: Every day  . Substances: Nicotine, Flavoring  Substance and Sexual Activity  . Alcohol use: No  . Drug use: Not Currently    Types: IV, Cocaine, Hydrocodone, Oxycodone, Marijuana, Heroin    Comment: takes methadone- now taking subutex (03/27/19)  . Sexual activity: Not Currently    Birth control/protection: Abstinence  Other Topics Concern  . Not on file  Social History Narrative  . Not on file   Social Determinants of Health   Financial Resource Strain: Not on file  Food Insecurity: Not on file  Transportation Needs: Not on file  Physical Activity: Not on file  Stress: Not on file  Social Connections: Not on file   Additional Social History:    Allergies:  No Known Allergies  Labs: No results found for this or any previous visit (from the past 48 hour(s)).  Current Facility-Administered Medications  Medication Dose Route Frequency Provider Last Rate Last Admin  . acetaminophen (TYLENOL) tablet 650 mg  650 mg Oral Q6H PRN Russella DarEllis, Allison L, NP   650 mg at 05/30/20 2019  . benzocaine (ORAJEL) 10 % mucosal gel   Mouth/Throat QID PRN Marikay Alarenny, Melanie, FNP   Given at 05/13/20 0016  . carbamazepine (TEGRETOL) tablet 800 mg  800 mg Oral TID Russella DarEllis, Allison L, NP   800 mg at 06/03/20 1151  . citalopram (CELEXA) tablet 20 mg  20 mg Oral Daily Maryagnes AmosStarkes-Perry, Takeesha Isley S, FNP   20 mg at 06/03/20 1152  . clonazePAM (KLONOPIN) tablet 1 mg  1 mg Oral Q6H Russella DarEllis, Allison L, NP   1 mg at 06/03/20 0644  . cloNIDine (CATAPRES - Dosed in mg/24 hr) patch 0.1 mg  0.1 mg Transdermal Weekly Alwyn RenMathews, [name] G, MD   0.1 mg at 05/28/20 2118  . diclofenac Sodium (VOLTAREN) 1 % topical gel 4 g  4 g Topical QID Russella DarEllis, Allison L, NP   4 g at 06/02/20 0910  . docusate sodium (COLACE) capsule 100 mg  100 mg Oral BID Russella DarEllis, Allison L, NP   100 mg at 06/03/20 1152  . feeding supplement (ENSURE ENLIVE / ENSURE PLUS) liquid 237 mL  237 mL  Oral BID BM Hughie ClossPahwani, Ravi, MD   237 mL at 05/31/20 1557  . lactulose (CHRONULAC) 10 GM/15ML solution 10 g  10 g Oral Daily Junious SilkEllis, Allison L, NP   10 g at 06/03/20 1152  . loratadine (CLARITIN) tablet 10 mg  10 mg Oral Daily PRN Omelia BlackwaterKyere, Belinda K, NP   10 mg at 05/11/20 0500  . melatonin tablet 10 mg  10 mg Oral QHS Maryagnes AmosStarkes-Perry, Irem Stoneham S, FNP   10 mg at 06/02/20 2126  . mirtazapine (REMERON) tablet 30 mg  30 mg Oral QHS Rennis HardingEllis,  Kelle Darting, NP   30 mg at 06/02/20 2126  . multivitamin with minerals tablet 1 tablet  1 tablet Oral Daily Russella Dar, NP   1 tablet at 06/03/20 1152  . naltrexone (DEPADE) tablet 25 mg  25 mg Oral Daily Russella Dar, NP   25 mg at 06/03/20 1152  . ondansetron (ZOFRAN-ODT) disintegrating tablet 4 mg  4 mg Oral Q8H PRN Sherryll Burger, Pratik D, DO   4 mg at 06/02/20 0227  . polyethylene glycol (MIRALAX / GLYCOLAX) packet 17 g  17 g Oral BID Russella Dar, NP   17 g at 06/03/20 1152  . pyridOXINE (VITAMIN B-6) tablet 100 mg  100 mg Oral Daily Russella Dar, NP   100 mg at 06/03/20 1156  . sodium chloride (OCEAN) 0.65 % nasal spray 1 spray  1 spray Each Nare PRN Omelia Blackwater K, NP      . thiamine tablet 100 mg  100 mg Oral Daily Russella Dar, NP   100 mg at 06/03/20 1152  . traZODone (DESYREL) tablet 150 mg  150 mg Oral QHS Russella Dar, NP   150 mg at 06/02/20 2126  . ziprasidone (GEODON) capsule 80 mg  80 mg Oral 2 times per day Marguerita Merles Latif, DO   80 mg at 06/03/20 0758  . ziprasidone (GEODON) injection 20 mg  20 mg Intramuscular Q8H PRN Russella Dar, NP   20 mg at 05/27/20 2217    Musculoskeletal: Strength & Muscle Tone: N/A Gait & Station: normal Patient leans: N/A  Psychiatric Specialty Exam: Physical Exam Vitals and nursing note reviewed.     Review of Systems  Unable to perform ROS: Other    Blood pressure 102/66, pulse 88, temperature 97.7 F (36.5 C), temperature source Oral, resp. rate 20, height 5\' 5"  (1.651 m), weight 78.3 kg,  SpO2 99 %.Body mass index is 28.71 kg/m.  General Appearance: Fairly Groomed  Eye Contact:  Fair  Speech:  Clear and Coherent and Normal Rate  Volume:  Normal,  Mood:  Euthymic  Affect:  Appropriate and Congruent  Thought Process:  Coherent and Descriptions of Associations: Intact,   Orientation:  Full (Time, Place, and Person)  Thought Content:  Logical  Suicidal Thoughts:  No  Homicidal Thoughts:  No  Memory:  Immediate;   Fair Recent;   Fair Remote;   Fair  Judgement:  Intact  Insight:  Shallow  Psychomotor Activity:  Normal  Concentration:  limited  Recall:  [employer] of Knowledge:  Fair  Language:  Fair  Akathisia:  No  Handed:  Right  AIMS (if indicated):     Assets:  Desire for Improvement Resilience Social Support  ADL's:  Complete  Cognition:  Altered at times  Sleep:       Assessment Anita Davis is a 35 y.o. year old female with a history of polysubstance use disorder, who presented to [facility] hospital in Sep 2021 with encephalopathy. EEG showed status epilepticus arising from the left central region. She was treated with Keppra, Vimpat and Dilantin and her mental status reportedly improved. She was transferred to [facility] for further work up. Per chart review, her mental status has improved, however her mood disorder is now manifesting delusions, psychosis, and persecutory beliefs.    #Bipolar affective psychosis  # PTSD # Substance-induced mood disorder  Patient continues to exhibit a significant amount unpredictable behaviors, paranoia, psychosis, and delusional thinking.  She continues to require frequent redirections,  and use of prn medications. SHe last received Geodon IM on 05/27/2020. While the need for prn medications continues to decrease, she continues to have emotional dysregulation, unpredictable behaviors, irritability, and mood swings. She continues to remain unsafe discharge home at this time. Patient continues to require higher level of care  to ensure her overall safety with goals to restore and recover her previous mental state.  Although this has been quite a feat due to extended length of stay, seizure disorder, polysubstance abuse, and altered mental status.    Treatment Plan Summary: Plan as below - Continue IVC due to high risk of self harm and others.  -Continue safety precaution and sitter for unpredicted behavior - Continue Geodon 80 mg BID  -Continue Geodon 20 mg IM twice daily as needed for agitation, aggression, and psychosis. - Monitor EKG to rule out QTc prolongation. Would advise discontinuation of Geodon if QTc>500 msec -Will continue citalopram 20 mg p.o. daily for management of behavioral disturbances. - Continue naltrexone 25 mg daily  - Continue melatonin and Trazodone100mg  po qhs  - Consider tapering off or adjust dosing Klonopin if medically appropriate.  She is currently on Klonopin 1mg  po Q6 hr, and is requesting it daily.  -Continue to [facility] for [facility]-term management of chronic psychiatric condition, unpredictable and erratic behaviors, worsening aggression and rapid cycling that results in dangerous behaviors and insult to injury to self and others.   No changes at this time see below.  We have continued to maximize all treatment options at this time with no significant improvement.  Patient continues to require higher level of care [facility] for long-term management of chronic psychiatric condition. [facility] also offers medical unit which can continue to assist patient in her overall recovery of mental illness and medical health.  At this current time patient does not meet criteria for acute psychiatric hospitalization as noted she has maximized most treatment options and remains unstable.    Disposition: [facility]. Please coordinate referral with SW to faciliate admisison to [facility].  Patient appears to be treatment resistment, and all services have been maximized.     [facility], FNP 06/03/2020 11:57 AM

## 2020-06-04 NOTE — Progress Notes (Signed)
[facility] PROGRESS NOTE  Anita Davis UJW:[id number] DOB: 03/11/1986 DOA: 01/04/2020 PCP: [name], PA-C    02/10/20                      02/15/20                       04/08/20    04/01/21  Status: Remains inpatient appropriate because:Altered mental status, Unsafe d/c plan and Inpatient level of care appropriate due to severity of illness   Dispo: The patient is from: Home              [facility]              Anticipated d/c date is: > 3 days              Patient currently is medically stable to d/c.  Barriers to discharge: Significant aggressive and violent outbursts have improved w/ no episodes sine 1/24. Remains on priority list for [facility]. [facility] re contacted for admit given improved behaviors-this facility is ECT capable  **1/31: Chart has been reviewed by [facility] and given patient's underlying neurological condition i.e. previous history of status epilepticus she is not an appropriate candidate for ECT.**  **2/1: [facility] staff have reevaluated this patient.  She continues to require high level inpatient psychiatric care which is not able to be provided at [facility] behavioral health facilities.  Recommendation is to continue to pursue [facility] placement. 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  Patient presented to [facility], ER in [place] several times prior to admission.  Initial presentation was related to symptoms from acute cystitis.  Two other presentations related to insomnia.  On those occasions she was able to be discharged from the ER.   She returned again on 13 September being brought in by family for altered mental status.  After admission this patient developed seizure activity which progressed to status epilepticus therefore she was transferred to [facility] for further neurological evaluation.  After several days recurrent seizures were aborted with combination of 3 antiepileptic drugs.  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  Fortunately this did not improve patient's mentation and she continued to have waxing and waning episodes of alertness and encephalopathy.  Since admission she has not had any further seizures so these medications were discontinued.  She has eventually awakened behaviors alternating between awake and appropriate, awake crying and depressed occasionally delusional, severe agitation and aggression with significant delusions.  She has been evaluated both by neurology and psychiatry who suspect a combination of sequelae from underlying brain injury as well as bipolar disorder with schizoaffective features.  During one of her alert phases patient did admit to being diagnosed with bipolar disease prior to admission.  Patient is on multiple psychotropic medications continued to have episodes of violent outbursts.  She had required frequent doses of IM Geodon as well as Recruitment consultant and frequent intervention by security staff during her episodes of severe agitation.  Recent chart review by [facility] states patient would  not be a candidate for ECT given recent brain injury sequela as well as presentation with status epilepticus.  [name] had been remaining stable regarding extreme agitation and/or violent behaviors since 1/24 when she had received her last Geodon IM dose.  On the evening of 2/4 she was also given a dose of IM Geodon.  There was no chart documentation stating what type of behavior she was experiencing  but typically this medication is only given if she has extreme agitation.  Subjective: Patient was seen and examined this morning.. Stable no issues overnight   Objective: Vitals:   06/03/20 2300 06/04/20 0632  BP: 102/66 100/72  Pulse: 88 72  Resp: 18 16  Temp: 98.2 F (36.8 C) 97.8 F (36.6 C)  SpO2: 98% 98%   No intake or output data in the 24 hours ending 06/04/20 0850 Filed Weights   06/01/20 1233 06/03/20 0300 06/04/20 6195  Weight: 78.8 kg 78.3 kg 78.3 kg       Physical Exam:   General:  Alert, oriented, cooperative, in no distress; somewhat depressed, but engage in conversation  HEENT:  Normocephalic, PERRL, otherwise with in Normal limits   Neuro:  CNII-XII intact. , normal motor and sensation, reflexes intact   Lungs:   Clear to auscultation BL, Respirations unlabored, no wheezes / crackles  Cardio:    S1/S2, RRR, No murmure, No Rubs or Gallops   Abdomen:   Soft, non-tender, bowel sounds active all four quadrants,  no guarding or peritoneal signs.  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 During periods of alertness and lucidity patient denied use of methamphetamines prior to admission although did admit to abruptly stopping her methadone -Continue max  dose Geodon 80 mg BID, naltrexone, scheduled Klonopin, Tegretol, Celexa and Remeron at hour of sleep -Continue Tegretol to 800 mg TID as of 1/29 with improvement in reports of severe anxiety.  LFTs remain stable with AST 70, ALT 63 with normal total bilirubin -Given recurrent worsening of depressive symptoms will decrease Klonopin back down to 1 mg 4 times daily.  If insomnia worsens can change nighttime dose to 2 mg. -Continue 1:1 safety sitter  -Appreciate assistance of psych team. It appears we have maxed out on all psychotropic medications for this patient.  It is noted that many of these medications will take weeks to months for full therapeutic benefit.  Given her persistent severe behavioral swings that include violent episodes of physically attacking staff it has been determined that patient would benefit from aggressive psychiatric treatment at [facility]  --Continue IM Geodon every 8-12 hours as needed severe agitation -2/4 patient in great spirits, more alert and oriented and interactive with staff as documented above.  Again she has demonstrated wide-ranging emotions, ability to interact with staff and appropriateness that alternates with severe delusions and at times aggressive and violent behaviors. -  Mentation remained stable  Severe insomnia -Continue Remeron and trazodone  -Monitoring no medication changes today  History of polysubstance abuse with heroin/history of outpatient methadone treatment -Prescribed methadone 60 mg daily from [place] in [place] prior to [place] -Continue naltrexone to treat heroin addiction as well as recent issues with impulsivity and hypersexual behaviors during admission which are suspected related to brain injury and worsening psychiatric condition    Depression and severe anxiety disorder/PTSD/no bipolar disorder now with schizoaffective features -Extremes in behavior have significantly improved-continue IVC (last updated 2/8) -See above regarding  pharmacotherapy  Recurrent constipation/nausea -Continue Colace 100 mg twice daily prn -Continue MiraLAX to twice daily - has had multiple bowel movements since initiation of scheduled lactulose-lactulose could be contributing to ongoing nausea therefore will change to prn -As a precaution with her history of recurrent UTI will check urinalysis and culture   Repeat UA 05/1120 >>> unremarkable - -Improved (stool x3/20 4 hours)    Other problems: Status epilepticus -Remained stable no further episodes  -Suspect precipitated by abrupt withdrawal of methadone as reported by patient -Repeat MRI on 12/28 within normal limits and it is felt behavioral issues related to brain injury sequelae and chronic psychiatric condition.  Neurology has signed off -Vimpat, Dilantin and phenobarbital have been tapered and discontinued at recommendation of neurologist  Nonobstructive transaminitis in context of hepatitis C -Elevated HCV antibody with markedly elevated HCV RNA quantitative level  consistent with chronic hepatitis C  -LFTs remain stable without an obstructive pattern noted -11/10: Discussed with ID Dr. Manson PasseyMandahar. Treatment of hepatitis C typically is initiated in the outpatient setting. Medications can be quite expensive and usually take time to obtain insurance approval.  -ID recommends scheduling outpatient appointment their clinic closer to discharge date -Stable  Back pain 2/2 abnormal urinalysis consistent with UTI -Patient with low-grade fever overnight and is now having back pain for days not responsive to NSAIDs and muscle relaxers -DG lumbar spine unremarkable -During hospitalization patient has been treated for pansensitive E. coli UTI as well as Salmonella UTI both of which have been sensitive to Levaquin -Completed Levaquin 12/20-urine culture obtained after antibiotics initiated and showed no growth -CT abdomen and pelvis on 12/17 unremarkable -Remained stable  Salmonella  UTI -Completed amoxicillin -No complaints  Abnormal urinalysis -Not consistent with UTI, was positive for moderate hemoglobin and greater than 50 RBCs with an elevated specific gravity -Dehydration was suspected-improved -Patient denies back pain or history of renal calculi although given her confusion unclear if this history is accurate  Physical deconditioning -Continue to complain of generalized pain, weakness -Able to ambulate without assist -Does continue to exhibit impulsivity but maintains appropriate balance and gait no longer requires one-to-one safety sitter  Large hemorrhoids. -Improved -Markedly improved-LD Proctofoam 12/15  Dysphagia -Resolved -Tolerating p.o.   Data Reviewed: Reviewed  Studies: No results found.  Scheduled Meds: . carbamazepine  800 mg Oral TID  . citalopram  20 mg Oral Daily  . clonazePAM  1 mg Oral Q6H  . cloNIDine  0.1 mg Transdermal Weekly  . diclofenac Sodium  4 g Topical QID  . docusate sodium  100 mg Oral BID  . feeding supplement  237 mL Oral BID BM  . melatonin  10 mg Oral QHS  . mirtazapine  30 mg Oral QHS  . multivitamin with minerals  1 tablet Oral Daily  . naltrexone  25 mg Oral Daily  . polyethylene glycol  17 g Oral BID  . vitamin B-6  100 mg Oral  Daily  . thiamine  100 mg Oral Daily  . traZODone  150 mg Oral QHS  . ziprasidone  80 mg Oral 2 times per day   Continuous Infusions:   Principal Problem:   Delirium due to multiple etiologies Active Problems:   Acute metabolic encephalopathy   Hypokalemia   Polysubstance abuse (HCC)   Elevated CK   Transaminitis   Refractory seizure (HCC)   Chronic hepatitis C without hepatic coma (HCC)   Distended abdomen   Palliative care encounter   Colonic Ileus (HCC)   Organic brain syndrome   On enteral nutrition   Physical deconditioning   Protein-calorie malnutrition (HCC)   Inadequate oral nutritional intake   Impulse  disorder   Consultants:  Neurology  Psychiatry  Interventional radiology  Surgery    Procedures:  9/14 lumbar puncture  9/15 EEG  9/16 EEG  9/17 EEG  9/19 overnight EEG with video  9/22 overnight EEG with video with discontinuation of long-term EEG monitoring on 9/25  9/24 core track placement  10/6 EEG  12/15 PEG tube removed    Antibiotics: Record reviewed, completed the course    Time spent: 25 minutes    Zuma Hust A Tinsley Everman ANP  [facility] 7 am - 330 pm/M-F for direct patient care and secure chat Please see Amion for contact information 150 days

## 2020-06-04 NOTE — Plan of Care (Signed)
  Problem: Clinical Measurements: Goal: Ability to maintain clinical measurements within normal limits will improve Outcome: Progressing   

## 2020-06-05 LAB — URINE CULTURE

## 2020-06-05 NOTE — Progress Notes (Signed)
[facility] PROGRESS NOTE  Anita Davis UJW:[id number] DOB: 03/11/1986 DOA: 01/04/2020 PCP: Anita Hawking, PA-C    02/10/20                      02/15/20                       04/08/20    04/01/21  Status: Remains inpatient appropriate because:Altered mental status, Unsafe d/c plan and Inpatient level of care appropriate due to severity of illness   Dispo: The patient is from: Home              [facility]              Anticipated d/c date is: > 3 days              Patient currently is medically stable to d/c.  Barriers to discharge: Significant aggressive and violent outbursts have improved w/ no episodes sine 1/24. Remains on priority list for [facility]. [facility] re contacted for admit given improved behaviors-this facility is ECT capable  **1/31: Chart has been reviewed by [facility] and given patient's underlying neurological condition i.e. previous history of status epilepticus she is not an appropriate candidate for ECT.**  **2/1: [facility] staff have reevaluated this patient.  She continues to require high level inpatient psychiatric care which is not able to be provided at [facility] behavioral health facilities.  Recommendation is to continue to pursue [facility] placement. 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  Patient presented to [facility], ER in [place] several times prior to admission.  Initial presentation was related to symptoms from acute cystitis.  Two other presentations related to insomnia.  On those occasions she was able to be discharged from the ER.   She returned again on 13 September being brought in by family for altered mental status.  After admission this patient developed seizure activity which progressed to status epilepticus therefore she was transferred to [facility] for further neurological evaluation.  After several days recurrent seizures were aborted with combination of 3 antiepileptic drugs.  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  Fortunately this did not improve patient's mentation and she continued to have waxing and waning episodes of alertness and encephalopathy.  Since admission she has not had any further seizures so these medications were discontinued.  She has eventually awakened behaviors alternating between awake and appropriate, awake crying and depressed occasionally delusional, severe agitation and aggression with significant delusions.  She has been evaluated both by neurology and psychiatry who suspect a combination of sequelae from underlying brain injury as well as bipolar disorder with schizoaffective features.  During one of her alert phases patient did admit to being diagnosed with bipolar disease prior to admission.  Patient is on multiple psychotropic medications continued to have episodes of violent outbursts.  She had required frequent doses of IM Geodon as well as Recruitment consultant and frequent intervention by security staff during her episodes of severe agitation.  Recent chart review by [facility] states patient would  not be a candidate for ECT given recent brain injury sequela as well as presentation with status epilepticus.  Anita Davis had been remaining stable regarding extreme agitation and/or violent behaviors since 1/24 when she had received her last Geodon IM dose.  On the evening of 2/4 she was also given a dose of IM Geodon.  There was no chart documentation stating what type of behavior she was experiencing  but typically this medication is only given if she has extreme agitation.  Subjective:  The patient was seen and examined stable this morning withdrawal Seems depressed stating she will now be discharged from the hospital  Her blood pressures on the low side--denies of having any dizziness We will discontinue her clonidine    Objective: Vitals:   06/04/20 1948 06/05/20 0521  BP: 122/86 102/74  Pulse: 88 78  Resp: 18 16  Temp: 98 F (36.7 C) 97.8 F (36.6 C)  SpO2: 98% 98%    Intake/Output Summary (Last 24 hours) at 06/05/2020 1125 Last data filed at 06/05/2020 4098 Gross per 24 hour  Intake 1020 ml  Output -  Net 1020 ml   Filed Weights   06/03/20 0300 06/04/20 0632 06/05/20 0521  Weight: 78.3 kg 78.3 kg 78.9 kg       Physical Exam:   General:  Alert, oriented, cooperative, no distress;   HEENT:  Normocephalic, PERRL, otherwise with in Normal limits   Neuro:  CNII-XII intact. , normal motor and sensation, reflexes intact   Lungs:   Clear to auscultation BL, Respirations unlabored, no wheezes / crackles  Cardio:    S1/S2, RRR, No murmure, No Rubs or Gallops   Abdomen:   Soft, non-tender, bowel sounds active all four quadrants,  no guarding or peritoneal signs.  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 During periods of alertness and lucidity patient denied use of methamphetamines prior to admission although did admit to abruptly stopping her methadone -Continue max dose Geodon 80 mg BID, naltrexone, scheduled Klonopin, Tegretol, Celexa and Remeron at hour of sleep -Continue Tegretol to 800 mg TID as of 1/29 with improvement in reports of severe anxiety.  LFTs remain stable with AST 70, ALT 63 with normal total bilirubin -Given recurrent worsening of depressive symptoms will decrease Klonopin back down to 1 mg 4 times daily.  If insomnia worsens can change nighttime dose to 2 mg. -Continue 1:1 safety sitter  -Appreciate assistance of psych team. It appears we have maxed out on all psychotropic medications for this patient.  It is noted that many of these medications will take weeks to months for full therapeutic benefit.  Given her persistent severe behavioral swings that include violent episodes of physically attacking staff it has been determined that patient would benefit from aggressive psychiatric treatment at [facility]  --Continue IM Geodon every 8-12 hours as needed severe agitation -2/4 patient  in great spirits, more alert and oriented and interactive with staff as documented above.  Again she has demonstrated wide-ranging emotions, ability to interact with staff and appropriateness that alternates with severe delusions and at times aggressive and violent behaviors. -Withdrawn - otherwise mentation at baseline  Severe insomnia -Continue Remeron and trazodone  --Stable no changes  History of polysubstance abuse with heroin/history of outpatient methadone treatment -Prescribed methadone 60 mg daily from a clinic in [place] prior to admission -Continue naltrexone to treat heroin addiction as well as recent issues with impulsivity and hypersexual behaviors during admission which are suspected related to brain injury and worsening  psychiatric condition    Depression and severe anxiety disorder/PTSD/no bipolar disorder now with schizoaffective features -Extremes in behavior have significantly improved-continue IVC (last updated 2/8) -See above regarding pharmacotherapy -Continue to resemble signs of depression-withdrawal  Recurrent constipation/nausea -Continue Colace 100 mg twice daily prn -Continue MiraLAX to twice daily - has had multiple bowel movements since initiation of scheduled lactulose-lactulose could be contributing to ongoing nausea therefore will change to prn -As a precaution with her history of recurrent UTI will check urinalysis and culture   Repeat UA 05/1120 >>> unremarkable - -improved    Other problems: Status epilepticus -Remained stable no further episodes  -Suspect precipitated by abrupt withdrawal of methadone as reported by patient -Repeat MRI on 12/28 within normal limits and it is felt behavioral issues related to brain injury sequelae and chronic psychiatric condition.  Neurology has signed off -Vimpat, Dilantin and phenobarbital have been tapered and discontinued at recommendation of neurologist  Nonobstructive transaminitis in context of hepatitis C -Elevated HCV antibody with markedly elevated HCV RNA quantitative level  consistent with chronic hepatitis C  -LFTs remain stable without an obstructive pattern noted -11/10: Discussed with ID Dr. Manson Passey. Treatment of hepatitis C typically is initiated in the outpatient setting. Medications can be quite expensive and usually take time to obtain insurance approval.  -ID recommends scheduling outpatient appointment their clinic closer to discharge date -Stable  Back pain 2/2 abnormal urinalysis consistent with UTI -Patient with low-grade fever overnight and is now having back pain for days not responsive to NSAIDs and muscle relaxers -DG lumbar spine unremarkable -During hospitalization patient has been treated for pansensitive  E. coli UTI as well as Salmonella UTI both of which have been sensitive to Levaquin -Completed Levaquin 12/20-urine culture obtained after antibiotics initiated and showed no growth -CT abdomen and pelvis on 12/17 unremarkable -No complaints today, stable  Salmonella UTI -Completed amoxicillin -No complaints today -Repeat UA within normal limits no signs of infection  Abnormal urinalysis -Not consistent with UTI, was positive for moderate hemoglobin and greater than 50 RBCs with an elevated specific gravity -Dehydration was suspected-improved -Patient denies back pain or history of renal calculi although given her confusion unclear if this history is accurate  Physical deconditioning -Continue to complain of generalized pain, weakness -Able to ambulate without assist -Does continue to exhibit impulsivity but maintains appropriate balance and gait no longer requires one-to-one safety sitter  Large hemorrhoids. -Improved -Markedly improved-LD Proctofoam 12/15  Dysphagia -Resolved -Tolerating p.o.  Hypotensive -Monitoring BP, clonidine discontinued   Data Reviewed: Reviewed  Studies: No results found.  Scheduled Meds: . carbamazepine  800 mg Oral TID  . citalopram  20 mg Oral Daily  . clonazePAM  1 mg Oral Q6H  . diclofenac Sodium  4 g Topical QID  . docusate sodium  100 mg Oral BID  . feeding supplement  237 mL Oral  BID BM  . melatonin  10 mg Oral QHS  . mirtazapine  30 mg Oral QHS  . multivitamin with minerals  1 tablet Oral Daily  . naltrexone  25 mg Oral Daily  . polyethylene glycol  17 g Oral BID  . vitamin B-6  100 mg Oral Daily  . thiamine  100 mg Oral Daily  . traZODone  150 mg Oral QHS  . ziprasidone  80 mg Oral 2 times per day   Continuous Infusions:   Principal Problem:   Delirium due to multiple etiologies Active Problems:   Acute metabolic encephalopathy   Hypokalemia   Polysubstance abuse (HCC)   Elevated CK   Transaminitis   Refractory  seizure (HCC)   Chronic hepatitis C without hepatic coma (HCC)   Distended abdomen   Palliative care encounter   Colonic Ileus (HCC)   Organic brain syndrome   On enteral nutrition   Physical deconditioning   Protein-calorie malnutrition (HCC)   Inadequate oral nutritional intake   Impulse disorder   Consultants:  Neurology  Psychiatry  Interventional radiology  Surgery    Procedures:  9/14 lumbar puncture  9/15 EEG  9/16 EEG  9/17 EEG  9/19 overnight EEG with video  9/22 overnight EEG with video with discontinuation of long-term EEG monitoring on 9/25  9/24 core track placement  10/6 EEG  12/15 PEG tube removed    Antibiotics: Record reviewed, completed the course    Time spent: 25 minutes    Seyed A Shahmehdi ANP  [facility] 7 am - 330 pm/M-F for direct patient care and secure chat Please see Amion for contact information 151 days

## 2020-06-06 NOTE — Progress Notes (Signed)
[facility] PROGRESS NOTE  [facility] AOZ:[id number] DOB: 08-Sep-1985 DOA: 01/04/2020 PCP: [facility] Hawking, PA-C    02/10/20                      02/15/20                       04/08/20    04/01/21  Status: Remains inpatient appropriate because:Altered mental status, Unsafe d/c plan and Inpatient level of care appropriate due to severity of illness   Dispo: The patient is from: Home              Anticipated d/c is to: [facility]              Anticipated d/c date is: > 3 days              Patient currently is medically stable to d/c.  Barriers to discharge: Significant aggressive and violent outbursts have improved w/ no episodes sine 1/24. Remains on priority list for [facility]. [facility] re contacted for admit given improved behaviors-this facility is ECT capable  **1/31: Chart has been reviewed by [facility] and given patient's underlying neurological condition i.e. previous history of status epilepticus she is not an appropriate candidate for ECT.**  **2/1: [facility] staff have reevaluated this patient.  She continues to require high level inpatient psychiatric care which is not able to be provided at [facility].  Recommendation is to continue to pursue [facility] placement. 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  Patient presented to [facility] several times prior to admission.  Initial presentation was related to symptoms from acute cystitis.  Two other presentations related to insomnia.  On those occasions she was able to be discharged from the ER.   She returned again on 13 September being brought in by family for altered mental status.  After admission this patient developed seizure activity which progressed to status epilepticus therefore she was transferred to [facility] for further neurological evaluation.  After several days recurrent seizures were aborted with combination of 3 antiepileptic drugs.  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  Fortunately this did not improve patient's mentation and she continued to have waxing and waning episodes of alertness and encephalopathy.  Since admission she has not had any further seizures so these medications were discontinued.  She has eventually awakened behaviors alternating between awake and appropriate, awake crying and depressed occasionally delusional, severe agitation and aggression with significant delusions.  She has been evaluated both by neurology and psychiatry who suspect a combination of sequelae from underlying brain injury as well as bipolar disorder with schizoaffective features.  During one of her alert phases patient did admit to being diagnosed with bipolar disease prior to admission.  Patient is on multiple psychotropic medications continued to have episodes of violent outbursts.  She had required frequent doses of IM Geodon as well as Recruitment consultant and frequent intervention by security staff during her episodes of severe agitation.  Recent chart review by [facility] states patient would  not be a candidate for ECT given recent brain injury sequela as well as presentation with status epilepticus.  [name] had been remaining stable regarding extreme agitation and/or violent behaviors since 1/24 when she had received her last Geodon IM dose.  On the evening of 2/4 she was also given a dose of IM Geodon.  There was no chart documentation stating what type of behavior she was experiencing  but typically this medication is only given if she has extreme agitation.  Subjective: Awake and reclining on right side.  Flat affect with very short, terse responses to questions asked.  Has no specific complaints.  Denies nausea or abdominal discomfort or back pain.  Objective: Vitals:   06/06/20 0227 06/06/20 0637  BP: 104/76 102/68  Pulse: 74 74  Resp: 16 18  Temp: 98.2 F (36.8 C) 98.1 F (36.7 C)  SpO2: 98% 98%    Intake/Output Summary (Last 24 hours) at 06/06/2020 4196 Last data filed at 06/06/2020 0546 Gross per 24 hour  Intake 150 ml  Output --  Net 150 ml   Filed Weights   06/04/20 2229 06/05/20 0521 06/06/20 0227  Weight: 78.3 kg 78.9 kg 78.3 kg    Exam: General: Awake, calm in no acute distress Pulmonary:  Lungs are clear posteriorly, stable on room air Cardiac:  Normal heart sounds, extremities warm to touch, pulse regular Abdomen: Soft nontender nondistended.  Eating well.  LBM 2/12 Neurological:  Cranial nerves II through XII grossly intact, no focal neurological deficits and no gait disturbance Psychiatric: Awake, alert, oriented times name and place but not to year.  Flat affect with terse responses to questions asked.  No violent/aggressive behaviors.   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 During periods of alertness and lucidity patient denied use of methamphetamines prior to admission although did admit to abruptly stopping her methadone -Continue max dose Geodon 80 mg BID,  naltrexone, scheduled Klonopin, Tegretol, Celexa and Remeron at hour of sleep -Continue Tegretol to 800 mg TID as of 1/29 with improvement in reports of severe anxiety.  LFTs remain stable with AST 70, ALT 63 with normal total bilirubin -Given recurrent worsening of depressive symptoms will decrease Klonopin back down to 1 mg 4 times daily.  If insomnia worsens can change nighttime dose to 2 mg. -Patient had also been on clonidine as an adjunct to antianxiety treatment but given low blood pressure readings with some mild dizziness this was discontinued on 2/13 -Continue 1:1 safety sitter  -Appreciate assistance of psych team. It appears we have maxed out on all psychotropic medications for this patient.  It is noted that many of these medications will take weeks to months for full therapeutic benefit.  Given her persistent severe behavioral swings that include violent episodes of physically attacking staff it has been determined that patient would benefit from aggressive psychiatric treatment at [facility]  --Continue IM Geodon every 8-12 hours as needed severe agitation -2/4 patient in great spirits, more alert and oriented and interactive with staff as documented above.  Again she has demonstrated wide-ranging emotions, ability to interact with staff and appropriateness that alternates with severe delusions and  at times aggressive and violent behaviors.  Severe insomnia -Continue Remeron and trazodone   History of polysubstance abuse with heroin/history of outpatient methadone treatment -Prescribed methadone 60 mg daily from a clinic in [place] prior to admission -Continue naltrexone to treat heroin addiction as well as recent issues with impulsivity and hypersexual behaviors during admission which are suspected related to brain injury and worsening psychiatric condition   Depression and severe anxiety disorder/PTSD/no bipolar disorder now with schizoaffective features -Extremes in behavior have  significantly improved-continue IVC (last updated 2/8) -See above regarding pharmacotherapy  Recurrent constipation/nausea -Continue Colace 100 mg twice daily prn -Continue MiraLAX to twice daily - has had multiple bowel movements since initiation of scheduled lactulose-lactulose could be contributing to ongoing nausea therefore will change to prn -As a precaution with her history of recurrent UTI will check urinalysis and culture to ensure nausea not secondary to UTI    Other problems: Status epilepticus -Resolved -Suspect precipitated by abrupt withdrawal of methadone as reported by patient -Repeat MRI on 12/28 within normal limits and it is felt behavioral issues related to brain injury sequelae and chronic psychiatric condition.  Neurology has signed off -Vimpat, Dilantin and phenobarbital have been tapered and discontinued at recommendation of neurologist  Nonobstructive transaminitis in context of hepatitis C -Elevated HCV antibody with markedly elevated HCV RNA quantitative level  consistent with chronic hepatitis C  -LFTs remain stable without an obstructive pattern noted -11/10: Discussed with ID Dr. Manson Passey. Treatment of hepatitis C typically is initiated in the outpatient setting. Medications can be quite expensive and usually take time to obtain insurance approval.  -ID recommends scheduling outpatient appointment their clinic closer to discharge date  Back pain 2/2 abnormal urinalysis consistent with UTI -Patient with low-grade fever overnight and is now having back pain for days not responsive to NSAIDs and muscle relaxers -DG lumbar spine unremarkable -During hospitalization patient has been treated for pansensitive E. coli UTI as well as Salmonella UTI both of which have been sensitive to Levaquin -Completed Levaquin 12/20-urine culture obtained after antibiotics initiated and showed no growth -CT abdomen and pelvis on 12/17 unremarkable  Salmonella  UTI -Completed amoxicillin  Abnormal urinalysis -Not consistent with UTI, was positive for moderate hemoglobin and greater than 50 RBCs with an elevated specific gravity -Suspect dehydration -Patient denies back pain or history of renal calculi although given her confusion unclear if this history is accurate  Physical deconditioning -Resolved -Does continue to exhibit impulsivity but maintains appropriate balance and gait no longer requires one-to-one safety sitter  Large hemorrhoids. -Resolved -Markedly improved-LD Proctofoam 12/15  Dysphagia -Resolved -Continue regular diet   Data Reviewed: Basic Metabolic Panel: No results for input(s): NA, K, CL, CO2, GLUCOSE, BUN, CREATININE, CALCIUM, MG, PHOS in the last 168 hours. Liver Function Tests: No results for input(s): AST, ALT, ALKPHOS, BILITOT, PROT, ALBUMIN in the last 168 hours. No results for input(s): LIPASE, AMYLASE in the last 168 hours. No results for input(s): AMMONIA in the last 168 hours. CBC: No results for input(s): WBC, NEUTROABS, HGB, HCT, MCV, PLT in the last 168 hours. Cardiac Enzymes: No results for input(s): CKTOTAL, CKMB, CKMBINDEX, TROPONINI in the last 168 hours. BNP (last 3 results) No results for input(s): BNP in the last 8760 hours.  ProBNP (last 3 results) No results for input(s): PROBNP in the last 8760 hours.  CBG: No results for input(s): GLUCAP in the last 168 hours.  Recent Results (from the past 240 hour(s))  Culture, Urine     Status:  Abnormal   Collection Time: 06/03/20  2:11 AM   Specimen: Urine, Catheterized  Result Value Ref Range Status   Specimen Description URINE, CATHETERIZED  Final   Special Requests   Final    NONE Performed at [facility], [address] [place], [place] [address]    Culture MULTIPLE SPECIES PRESENT, SUGGEST RECOLLECTION (A)  Final   Report Status 06/05/2020 FINAL  Final     Studies: No results found.  Scheduled Meds: . carbamazepine  800  mg Oral TID  . citalopram  20 mg Oral Daily  . clonazePAM  1 mg Oral Q6H  . diclofenac Sodium  4 g Topical QID  . docusate sodium  100 mg Oral BID  . feeding supplement  237 mL Oral BID BM  . melatonin  10 mg Oral QHS  . mirtazapine  30 mg Oral QHS  . multivitamin with minerals  1 tablet Oral Daily  . naltrexone  25 mg Oral Daily  . polyethylene glycol  17 g Oral BID  . vitamin B-6  100 mg Oral Daily  . thiamine  100 mg Oral Daily  . traZODone  150 mg Oral QHS  . ziprasidone  80 mg Oral 2 times per day   Continuous Infusions:   Principal Problem:   Delirium due to multiple etiologies Active Problems:   Acute metabolic encephalopathy   Hypokalemia   Polysubstance abuse (HCC)   Elevated CK   Transaminitis   Refractory seizure (HCC)   Chronic hepatitis C without hepatic coma (HCC)   Distended abdomen   Palliative care encounter   Colonic Ileus (HCC)   Organic brain syndrome   On enteral nutrition   Physical deconditioning   Protein-calorie malnutrition (HCC)   Inadequate oral nutritional intake   Impulse disorder   Consultants:  Neurology  Psychiatry  Interventional radiology  Surgery    Procedures:  9/14 lumbar puncture  9/15 EEG  9/16 EEG  9/17 EEG  9/19 overnight EEG with video  9/22 overnight EEG with video with discontinuation of long-term EEG monitoring on 9/25  9/24 core track placement  10/6 EEG  12/15 PEG tube removed    Antibiotics: Anti-infectives (From admission, onward)   Start     Dose/Rate Route Frequency Ordered Stop   05/11/20 0845  amoxicillin-clavulanate (AUGMENTIN) 500-125 MG per tablet 500 mg        1 tablet Oral Every 8 hours 05/11/20 0750 05/18/20 0559   05/11/20 0845  metroNIDAZOLE (FLAGYL) tablet 500 mg        500 mg Oral Every 8 hours 05/11/20 0750 05/18/20 0559   04/05/20 1415  levofloxacin (LEVAQUIN) tablet 500 mg        500 mg Oral Daily 04/05/20 1327 04/11/20 0911   03/19/20 1000  metroNIDAZOLE (FLAGYL)  tablet 500 mg  Status:  Discontinued        500 mg Per Tube 2 times daily 03/19/20 0822 03/23/20 0827   03/18/20 1200  amoxicillin (AMOXIL) 250 MG/5ML suspension 500 mg  Status:  Discontinued        500 mg Per Tube Every 8 hours 03/18/20 0915 03/23/20 0559   03/17/20 1200  cefTRIAXone (ROCEPHIN) 1 g in sodium chloride 0.9 % 100 mL IVPB  Status:  Discontinued        1 g 200 mL/hr over 30 Minutes Intravenous Every 24 hours 03/17/20 1048 03/18/20 0913   03/16/20 1130  metroNIDAZOLE (FLAGYL) 50 mg/ml oral suspension 500 mg  Status:  Discontinued  500 mg Per Tube 2 times daily 03/16/20 1040 03/19/20 0821   03/16/20 1130  fluconazole (DIFLUCAN) 40 MG/ML suspension 152 mg        150 mg Per Tube  Once 03/16/20 1040 03/16/20 1245   02/11/20 1526  ceFAZolin (ANCEF) IVPB 2g/100 mL premix        2 g 200 mL/hr over 30 Minutes Intravenous  Once 02/11/20 1526 02/11/20 1641   02/05/20 0600  ceFAZolin (ANCEF) IVPB 2g/100 mL premix        2 g 200 mL/hr over 30 Minutes Intravenous To Short Stay 02/04/20 1054 02/05/20 0950   01/29/20 0600  ceFAZolin (ANCEF) IVPB 2g/100 mL premix        2 g 200 mL/hr over 30 Minutes Intravenous On call to O.R. 01/28/20 1511 01/30/20 0559   01/28/20 2000  cefTRIAXone (ROCEPHIN) 1 g in sodium chloride 0.9 % 100 mL IVPB        1 g 200 mL/hr over 30 Minutes Intravenous Every 24 hours 01/28/20 1925 02/02/20 0724   01/06/20 0900  cefTRIAXone (ROCEPHIN) 2 g in sodium chloride 0.9 % 100 mL IVPB  Status:  Discontinued        2 g 200 mL/hr over 30 Minutes Intravenous Every 12 hours 01/06/20 0105 01/06/20 2202   01/06/20 0800  vancomycin (VANCOCIN) IVPB 1000 mg/200 mL premix  Status:  Discontinued       "Followed by" Linked Group Details   1,000 mg 200 mL/hr over 60 Minutes Intravenous Every 12 hours 01/05/20 1904 01/06/20 2202   01/05/20 2000  vancomycin (VANCOREADY) IVPB 1500 mg/300 mL       "Followed by" Linked Group Details   1,500 mg 150 mL/hr over 120 Minutes  Intravenous  Once 01/05/20 1904 01/06/20 0127   01/05/20 1830  cefTRIAXone (ROCEPHIN) 2 g in sodium chloride 0.9 % 100 mL IVPB        2 g 200 mL/hr over 30 Minutes Intravenous  Once 01/05/20 1829 01/05/20 2220       Time spent: 25 minutes    [name] ANP  [facility] 7 am - 330 pm/M-F for direct patient care and secure chat Please see Amion for contact information 152 days

## 2020-06-06 NOTE — Progress Notes (Signed)
CSW spoke with Corrie Dandy at [facility] who states there are no beds available today and that patient is still on the priority waiting list.  Edwin Dada, MSW, LCSW Transitions of Care  Clinical Social Worker II [phone]

## 2020-06-06 NOTE — Plan of Care (Signed)

## 2020-06-07 MED ORDER — ACETAMINOPHEN 325 MG PO TABS
650.0000 mg | ORAL_TABLET | Freq: Once | ORAL | Status: AC
  Administered 2020-06-07: 650 mg via ORAL
  Filled 2020-06-07: qty 2

## 2020-06-07 NOTE — Progress Notes (Signed)
[facility] PROGRESS NOTE  Anita Davis UJW:[id number] DOB: 03/11/1986 DOA: 01/04/2020 PCP: [name], PA-C    02/10/20                      02/15/20                       04/08/20    04/01/21  Status: Remains inpatient appropriate because:Altered mental status, Unsafe d/c plan and Inpatient level of care appropriate due to severity of illness   Dispo: The patient is from: Home              [facility]              Anticipated d/c date is: > 3 days              Patient currently is medically stable to d/c.  Barriers to discharge: Significant aggressive and violent outbursts have improved w/ no episodes sine 1/24. Remains on priority list for [facility]. [facility] re contacted for admit given improved behaviors-this facility is ECT capable  **1/31: Chart has been reviewed by [facility] and given patient's underlying neurological condition i.e. previous history of status epilepticus she is not an appropriate candidate for ECT.**  **2/1: [facility] staff have reevaluated this patient.  She continues to require high level inpatient psychiatric care which is not able to be provided at [facility] behavioral health facilities.  Recommendation is to continue to pursue [facility] placement. 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  Patient presented to [facility], ER in [place] several times prior to admission.  Initial presentation was related to symptoms from acute cystitis.  Two other presentations related to insomnia.  On those occasions she was able to be discharged from the ER.   She returned again on 13 September being brought in by family for altered mental status.  After admission this patient developed seizure activity which progressed to status epilepticus therefore she was transferred to [facility] for further neurological evaluation.  After several days recurrent seizures were aborted with combination of 3 antiepileptic drugs.  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  Fortunately this did not improve patient's mentation and she continued to have waxing and waning episodes of alertness and encephalopathy.  Since admission she has not had any further seizures so these medications were discontinued.  She has eventually awakened behaviors alternating between awake and appropriate, awake crying and depressed occasionally delusional, severe agitation and aggression with significant delusions.  She has been evaluated both by neurology and psychiatry who suspect a combination of sequelae from underlying brain injury as well as bipolar disorder with schizoaffective features.  During one of her alert phases patient did admit to being diagnosed with bipolar disease prior to admission.  Patient is on multiple psychotropic medications continued to have episodes of violent outbursts.  She had required frequent doses of IM Geodon as well as Recruitment consultant and frequent intervention by security staff during her episodes of severe agitation.  Recent chart review by [facility] states patient would  not be a candidate for ECT given recent brain injury sequela as well as presentation with status epilepticus.  [name] had been remaining stable regarding extreme agitation and/or violent behaviors since 1/24 when she had received her last Geodon IM dose.  On the evening of 2/4 she was also given a dose of IM Geodon.  There was no chart documentation stating what type of behavior she was experiencing  but typically this medication is only given if she has extreme agitation.  Subjective: Continues with flat affect and limited verbal conversation and eye contact.  Currently listening to music on tablet  Objective: Vitals:   06/06/20 2057 06/07/20 0654  BP: 124/88 101/65  Pulse: 85 70  Resp: 18 16  Temp: 97.9 F (36.6 C) 98.1 F (36.7 C)  SpO2: 98% 99%    Intake/Output Summary (Last 24 hours) at 06/07/2020 3532 Last data filed at 06/07/2020 0500 Gross per 24 hour  Intake 540 ml  Output --  Net 540 ml   Filed Weights   06/04/20 9924 06/05/20 0521 06/06/20 0227  Weight: 78.3 kg 78.9 kg 78.3 kg    Exam: General: Awake and in no acute distress Pulmonary:  Lungs clear, stable on room air Cardiac:  Normal heart sounds, pulse regular nontachycardic, no peripheral edema Abdomen: Obese and soft.  Tolerating diet.  LBM 2/12 Neurological:  Cranial nerves II through XII grossly intact, no focal neurological deficits and no gait disturbance Psychiatric: Oriented to name and place.  Not to year.  Continues with flat depressed affect.   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 During periods of alertness and lucidity patient denied use of methamphetamines prior to admission although did admit to abruptly stopping her methadone -Continue max dose Geodon 80 mg BID, naltrexone, scheduled Klonopin, Tegretol, Celexa and Remeron at hour of sleep -Continue Tegretol to 800 mg TID as of 1/29 with improvement in reports of  severe anxiety.  LFTs remain stable with AST 70, ALT 63 with normal total bilirubin -Given recurrent worsening of depressive symptoms will decrease Klonopin back down to 1 mg 4 times daily.  If insomnia worsens can change nighttime dose to 2 mg. -Patient had also been on clonidine as an adjunct to antianxiety treatment but given low blood pressure readings with some mild dizziness this was discontinued on 2/13 -Continue 1:1 safety sitter  -Appreciate assistance of psych team. It appears we have maxed out on all psychotropic medications for this patient.  It is noted that many of these medications will take weeks to months for full therapeutic benefit.  Given her persistent severe behavioral swings that include violent episodes of physically attacking staff it has been determined that patient would benefit from aggressive psychiatric treatment at [facility]  --Continue IM Geodon every 8-12 hours as needed severe agitation -2/4 patient in great spirits, more alert and oriented and interactive with staff as documented above.  Again she has demonstrated wide-ranging emotions, ability to interact with staff and appropriateness that alternates with severe delusions and at times aggressive and violent behaviors.  Severe insomnia -Continue Remeron and trazodone   History of polysubstance abuse with heroin/history of outpatient methadone  treatment -Prescribed methadone 60 mg daily from a clinic in [place] prior to admission -Continue naltrexone to treat heroin addiction as well as recent issues with impulsivity and hypersexual behaviors during admission which are suspected related to brain injury and worsening psychiatric condition   Depression and severe anxiety disorder/PTSD/no bipolar disorder now with schizoaffective features -Extremes in behavior have significantly improved-continue IVC (last updated 2/8) -See above regarding pharmacotherapy  Recurrent constipation/nausea -Continue Colace 100 mg twice  daily prn -Continue MiraLAX to twice daily - has had multiple bowel movements since initiation of scheduled lactulose-lactulose could be contributing to ongoing nausea therefore will change to prn -As a precaution with her history of recurrent UTI will check urinalysis and culture to ensure nausea not secondary to UTI    Other problems: Status epilepticus -Resolved -Suspect precipitated by abrupt withdrawal of methadone as reported by patient -Repeat MRI on 12/28 within normal limits and it is felt behavioral issues related to brain injury sequelae and chronic psychiatric condition.  Neurology has signed off -Vimpat, Dilantin and phenobarbital have been tapered and discontinued at recommendation of neurologist  Nonobstructive transaminitis in context of hepatitis C -Elevated HCV antibody with markedly elevated HCV RNA quantitative level  consistent with chronic hepatitis C  -LFTs remain stable without an obstructive pattern noted -11/10: Discussed with ID Dr. Manson Passey. Treatment of hepatitis C typically is initiated in the outpatient setting. Medications can be quite expensive and usually take time to obtain insurance approval.  -ID recommends scheduling outpatient appointment their clinic closer to discharge date  Back pain 2/2 abnormal urinalysis consistent with UTI -Patient with low-grade fever overnight and is now having back pain for days not responsive to NSAIDs and muscle relaxers -DG lumbar spine unremarkable -During hospitalization patient has been treated for pansensitive E. coli UTI as well as Salmonella UTI both of which have been sensitive to Levaquin -Completed Levaquin 12/20-urine culture obtained after antibiotics initiated and showed no growth -CT abdomen and pelvis on 12/17 unremarkable  Salmonella UTI -Completed amoxicillin  Abnormal urinalysis -Not consistent with UTI, was positive for moderate hemoglobin and greater than 50 RBCs with an elevated specific  gravity -Suspect dehydration -Patient denies back pain or history of renal calculi although given her confusion unclear if this history is accurate  Physical deconditioning -Resolved -Does continue to exhibit impulsivity but maintains appropriate balance and gait no longer requires one-to-one safety sitter  Large hemorrhoids. -Resolved -Markedly improved-LD Proctofoam 12/15  Dysphagia -Resolved -Continue regular diet   Data Reviewed: Basic Metabolic Panel: No results for input(s): NA, K, CL, CO2, GLUCOSE, BUN, CREATININE, CALCIUM, MG, PHOS in the last 168 hours. Liver Function Tests: No results for input(s): AST, ALT, ALKPHOS, BILITOT, PROT, ALBUMIN in the last 168 hours. No results for input(s): LIPASE, AMYLASE in the last 168 hours. No results for input(s): AMMONIA in the last 168 hours. CBC: No results for input(s): WBC, NEUTROABS, HGB, HCT, MCV, PLT in the last 168 hours. Cardiac Enzymes: No results for input(s): CKTOTAL, CKMB, CKMBINDEX, TROPONINI in the last 168 hours. BNP (last 3 results) No results for input(s): BNP in the last 8760 hours.  ProBNP (last 3 results) No results for input(s): PROBNP in the last 8760 hours.  CBG: No results for input(s): GLUCAP in the last 168 hours.  Recent Results (from the past 240 hour(s))  Culture, Urine     Status: Abnormal   Collection Time: 06/03/20  2:11 AM   Specimen: Urine, Catheterized  Result Value Ref Range Status   Specimen Description  URINE, CATHETERIZED  Final   Special Requests   Final    NONE Performed at [facility], [address], [place], [place] [address]    Culture MULTIPLE SPECIES PRESENT, SUGGEST RECOLLECTION (A)  Final   Report Status 06/05/2020 FINAL  Final     Studies: No results found.  Scheduled Meds: . carbamazepine  800 mg Oral TID  . citalopram  20 mg Oral Daily  . clonazePAM  1 mg Oral Q6H  . diclofenac Sodium  4 g Topical QID  . docusate sodium  100 mg Oral BID  . feeding  supplement  237 mL Oral BID BM  . melatonin  10 mg Oral QHS  . mirtazapine  30 mg Oral QHS  . multivitamin with minerals  1 tablet Oral Daily  . naltrexone  25 mg Oral Daily  . polyethylene glycol  17 g Oral BID  . vitamin B-6  100 mg Oral Daily  . thiamine  100 mg Oral Daily  . traZODone  150 mg Oral QHS  . ziprasidone  80 mg Oral 2 times per day   Continuous Infusions:   Principal Problem:   Delirium due to multiple etiologies Active Problems:   Acute metabolic encephalopathy   Hypokalemia   Polysubstance abuse (HCC)   Elevated CK   Transaminitis   Refractory seizure (HCC)   Chronic hepatitis C without hepatic coma (HCC)   Distended abdomen   Palliative care encounter   Colonic Ileus (HCC)   Organic brain syndrome   On enteral nutrition   Physical deconditioning   Protein-calorie malnutrition (HCC)   Inadequate oral nutritional intake   Impulse disorder   Consultants:  Neurology  Psychiatry  Interventional radiology  Surgery    Procedures:  9/14 lumbar puncture  9/15 EEG  9/16 EEG  9/17 EEG  9/19 overnight EEG with video  9/22 overnight EEG with video with discontinuation of long-term EEG monitoring on 9/25  9/24 core track placement  10/6 EEG  12/15 PEG tube removed    Antibiotics: Anti-infectives (From admission, onward)   Start     Dose/Rate Route Frequency Ordered Stop   05/11/20 0845  amoxicillin-clavulanate (AUGMENTIN) 500-125 MG per tablet 500 mg        1 tablet Oral Every 8 hours 05/11/20 0750 05/18/20 0559   05/11/20 0845  metroNIDAZOLE (FLAGYL) tablet 500 mg        500 mg Oral Every 8 hours 05/11/20 0750 05/18/20 0559   04/05/20 1415  levofloxacin (LEVAQUIN) tablet 500 mg        500 mg Oral Daily 04/05/20 1327 04/11/20 0911   03/19/20 1000  metroNIDAZOLE (FLAGYL) tablet 500 mg  Status:  Discontinued        500 mg Per Tube 2 times daily 03/19/20 0822 03/23/20 0827   03/18/20 1200  amoxicillin (AMOXIL) 250 MG/5ML suspension 500  mg  Status:  Discontinued        500 mg Per Tube Every 8 hours 03/18/20 0915 03/23/20 0559   03/17/20 1200  cefTRIAXone (ROCEPHIN) 1 g in sodium chloride 0.9 % 100 mL IVPB  Status:  Discontinued        1 g 200 mL/hr over 30 Minutes Intravenous Every 24 hours 03/17/20 1048 03/18/20 0913   03/16/20 1130  metroNIDAZOLE (FLAGYL) 50 mg/ml oral suspension 500 mg  Status:  Discontinued        500 mg Per Tube 2 times daily 03/16/20 1040 03/19/20 0821   03/16/20 1130  fluconazole (DIFLUCAN)  40 MG/ML suspension 152 mg        150 mg Per Tube  Once 03/16/20 1040 03/16/20 1245   02/11/20 1526  ceFAZolin (ANCEF) IVPB 2g/100 mL premix        2 g 200 mL/hr over 30 Minutes Intravenous  Once 02/11/20 1526 02/11/20 1641   02/05/20 0600  ceFAZolin (ANCEF) IVPB 2g/100 mL premix        2 g 200 mL/hr over 30 Minutes Intravenous To Short Stay 02/04/20 1054 02/05/20 0950   01/29/20 0600  ceFAZolin (ANCEF) IVPB 2g/100 mL premix        2 g 200 mL/hr over 30 Minutes Intravenous On call to O.R. 01/28/20 1511 01/30/20 0559   01/28/20 2000  cefTRIAXone (ROCEPHIN) 1 g in sodium chloride 0.9 % 100 mL IVPB        1 g 200 mL/hr over 30 Minutes Intravenous Every 24 hours 01/28/20 1925 02/02/20 0724   01/06/20 0900  cefTRIAXone (ROCEPHIN) 2 g in sodium chloride 0.9 % 100 mL IVPB  Status:  Discontinued        2 g 200 mL/hr over 30 Minutes Intravenous Every 12 hours 01/06/20 0105 01/06/20 2202   01/06/20 0800  vancomycin (VANCOCIN) IVPB 1000 mg/200 mL premix  Status:  Discontinued       "Followed by" Linked Group Details   1,000 mg 200 mL/hr over 60 Minutes Intravenous Every 12 hours 01/05/20 1904 01/06/20 2202   01/05/20 2000  vancomycin (VANCOREADY) IVPB 1500 mg/300 mL       "Followed by" Linked Group Details   1,500 mg 150 mL/hr over 120 Minutes Intravenous  Once 01/05/20 1904 01/06/20 0127   01/05/20 1830  cefTRIAXone (ROCEPHIN) 2 g in sodium chloride 0.9 % 100 mL IVPB        2 g 200 mL/hr over 30 Minutes Intravenous   Once 01/05/20 1829 01/05/20 2220       Time spent: 25 minutes    Junious SilkAllison Branston Halsted ANP  [facility] 7 am - 330 pm/M-F for direct patient care and secure chat Please see Amion for contact information 153 days

## 2020-06-08 NOTE — Plan of Care (Signed)
Problem: Education: Goal: Knowledge of General Education information will improve Description: Including pain rating scale, medication(s)/side effects and non-pharmacologic comfort measures Outcome: Progressing   Problem: Clinical Measurements: Goal: Ability to maintain clinical measurements within normal limits will improve Outcome: Progressing Goal: Will remain free from infection Outcome: Progressing Goal: Diagnostic test results will improve Outcome: Progressing Goal: Respiratory complications will improve Outcome: Progressing Goal: Cardiovascular complication will be avoided Outcome: Progressing   Problem: Activity: Goal: Risk for activity intolerance will decrease Outcome: Progressing   Problem: Nutrition: Goal: Adequate nutrition will be maintained Outcome: Progressing   Problem: Coping: Goal: Level of anxiety will decrease Outcome: Progressing   Problem: Elimination: Goal: Will not experience complications related to bowel motility Outcome: Progressing Goal: Will not experience complications related to urinary retention Outcome: Progressing   Problem: Pain Managment: Goal: General experience of comfort will improve Outcome: Progressing   Problem: Safety: Goal: Ability to remain free from injury will improve Outcome: Progressing   Problem: Skin Integrity: Goal: Risk for impaired skin integrity will decrease Outcome: Progressing   Problem: Education: Goal: Expressions of having a comfortable level of knowledge regarding the disease process will increase Outcome: Progressing   Problem: Coping: Goal: Ability to adjust to condition or change in health will improve Outcome: Progressing Goal: Ability to identify appropriate support needs will improve Outcome: Progressing   Problem: Health Behavior/Discharge Planning: Goal: Compliance with prescribed medication regimen will improve Outcome: Progressing   Problem: Medication: Goal: Risk for medication  side effects will decrease Outcome: Progressing   Problem: Clinical Measurements: Goal: Complications related to the disease process, condition or treatment will be avoided or minimized Outcome: Progressing Goal: Diagnostic test results will improve Outcome: Progressing   Problem: Safety: Goal: Verbalization of understanding the information provided will improve Outcome: Progressing   Problem: Self-Concept: Goal: Level of anxiety will decrease Outcome: Progressing Goal: Ability to verbalize feelings about condition will improve Outcome: Progressing   Problem: Education: Goal: Ability to state activities that reduce stress will improve Outcome: Progressing   Problem: Coping: Goal: Ability to identify and develop effective coping behavior will improve Outcome: Progressing   Problem: Self-Concept: Goal: Ability to identify factors that promote anxiety will improve Outcome: Progressing Goal: Level of anxiety will decrease Outcome: Progressing Goal: Ability to modify response to factors that promote anxiety will improve Outcome: Progressing   Problem: Education: Goal: Utilization of techniques to improve thought processes will improve Outcome: Progressing Goal: Knowledge of the prescribed therapeutic regimen will improve Outcome: Progressing   Problem: Activity: Goal: Interest or engagement in leisure activities will improve Outcome: Progressing Goal: Imbalance in normal sleep/wake cycle will improve Outcome: Progressing   Problem: Coping: Goal: Coping ability will improve Outcome: Progressing Goal: Will verbalize feelings Outcome: Progressing   Problem: Health Behavior/Discharge Planning: Goal: Ability to make decisions will improve Outcome: Progressing Goal: Compliance with therapeutic regimen will improve Outcome: Progressing   Problem: Role Relationship: Goal: Will demonstrate positive changes in social behaviors and relationships Outcome: Progressing    Problem: Safety: Goal: Ability to disclose and discuss suicidal ideas will improve Outcome: Progressing Goal: Ability to identify and utilize support systems that promote safety will improve Outcome: Progressing   Problem: Self-Concept: Goal: Will verbalize positive feelings about self Outcome: Progressing Goal: Level of anxiety will decrease Outcome: Progressing   Problem: Education: Goal: Verbalization of understanding the information provided will improve Outcome: Progressing   Problem: Coping: Goal: Ability to demonstrate appropriate behavior when angry will improve Outcome: Progressing Goal: Ability to identify and develop effective  coping behavior will improve Outcome: Progressing Goal: Ability to interact with others will improve Outcome: Progressing   Problem: Health Behavior/Discharge Planning: Goal: Identification of resources available to assist in meeting health care needs will improve Outcome: Progressing

## 2020-06-08 NOTE — Progress Notes (Signed)
[facility] PROGRESS NOTE  Anita Davis UJW:[id number] DOB: 03/11/1986 DOA: 01/04/2020 PCP: [name], PA-C    02/10/20                      02/15/20                       04/08/20    04/01/21  Status: Remains inpatient appropriate because:Altered mental status, Unsafe d/c plan and Inpatient level of care appropriate due to severity of illness   Dispo: The patient is from: Home              [facility]              Anticipated d/c date is: > 3 days              Patient currently is medically stable to d/c.  Barriers to discharge: Significant aggressive and violent outbursts have improved w/ no episodes sine 1/24. Remains on priority list for [facility]. [facility] re contacted for admit given improved behaviors-this facility is ECT capable  **1/31: Chart has been reviewed by [facility] and given patient's underlying neurological condition i.e. previous history of status epilepticus she is not an appropriate candidate for ECT.**  **2/1: [facility] staff have reevaluated this patient.  She continues to require high level inpatient psychiatric care which is not able to be provided at [facility] behavioral health facilities.  Recommendation is to continue to pursue [facility] placement. 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  Patient presented to [facility], ER in [place] several times prior to admission.  Initial presentation was related to symptoms from acute cystitis.  Two other presentations related to insomnia.  On those occasions she was able to be discharged from the ER.   She returned again on 13 September being brought in by family for altered mental status.  After admission this patient developed seizure activity which progressed to status epilepticus therefore she was transferred to [facility] for further neurological evaluation.  After several days recurrent seizures were aborted with combination of 3 antiepileptic drugs.  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  Fortunately this did not improve patient's mentation and she continued to have waxing and waning episodes of alertness and encephalopathy.  Since admission she has not had any further seizures so these medications were discontinued.  She has eventually awakened behaviors alternating between awake and appropriate, awake crying and depressed occasionally delusional, severe agitation and aggression with significant delusions.  She has been evaluated both by neurology and psychiatry who suspect a combination of sequelae from underlying brain injury as well as bipolar disorder with schizoaffective features.  During one of her alert phases patient did admit to being diagnosed with bipolar disease prior to admission.  Patient is on multiple psychotropic medications continued to have episodes of violent outbursts.  She had required frequent doses of IM Geodon as well as Recruitment consultant and frequent intervention by security staff during her episodes of severe agitation.  Recent chart review by [facility] states patient would  not be a candidate for ECT given recent brain injury sequela as well as presentation with status epilepticus.  [name] had been remaining stable regarding extreme agitation and/or violent behaviors since 1/24 when she had received her last Geodon IM dose.  On the evening of 2/4 she was also given a dose of IM Geodon.  There was no chart documentation stating what type of behavior she was experiencing  but typically this medication is only given if she has extreme agitation.  Subjective: Awake and alert.  Sitting up in bed.  More interactive today.  Alert and oriented.  Frustrated that battery-operated relaxation fountain is not working properly.  Playing game on her pad.  Objective: Vitals:   06/07/20 2300 06/08/20 0359  BP: 110/77 105/68  Pulse: 78 80  Resp: 18 16  Temp: 98.2 F (36.8 C) 97.8 F (36.6 C)  SpO2: 98% 98%    Intake/Output Summary (Last 24 hours) at 06/08/2020 0856 Last data filed at 06/07/2020 2335 Gross per 24 hour  Intake 1160 ml  Output --  Net 1160 ml   Filed Weights   06/05/20 0521 06/06/20 0227 06/08/20 0359  Weight: 78.9 kg 78.3 kg 78.2 kg    Exam: General: Alert and in no acute distress.  Calm Pulmonary:  Lungs are clear, stable on room air Cardiac:  Normal heart sounds, pulse regular, extremities warm Abdomen: Nontender.  Eating well.  LBM 2/12 Neurological:  Cranial nerves II through XII grossly intact, no focal neurological deficits and no gait disturbance Psychiatric: Alert and oriented x3.  Pleasant and calm affect and more interactive today.   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 During periods of alertness and lucidity patient denied use of methamphetamines prior to admission although did admit to abruptly stopping her methadone -Continue max dose Geodon 80 mg BID, naltrexone, scheduled Klonopin, Tegretol, Celexa and Remeron at hour of sleep -Continue Tegretol to 800  mg TID as of 1/29 with improvement in reports of severe anxiety.  LFTs remain stable with AST 70, ALT 63 with normal total bilirubin -Given recurrent worsening of depressive symptoms will decrease Klonopin back down to 1 mg 4 times daily.  If insomnia worsens can change nighttime dose to 2 mg. -Patient had also been on clonidine as an adjunct to antianxiety treatment but given low blood pressure readings with some mild dizziness this was discontinued on 2/13 -Continue 1:1 safety sitter  -Appreciate assistance of psych team. It appears we have maxed out on all psychotropic medications for this patient.  It is noted that many of these medications will take weeks to months for full therapeutic benefit.  Given her persistent severe behavioral swings that include violent episodes of physically attacking staff it has been determined that patient would benefit from aggressive psychiatric treatment at [facility]  --Continue IM Geodon every 8-12 hours as needed severe agitation -2/4 patient in great spirits, more alert and oriented and interactive with staff as documented above.  Again she has demonstrated wide-ranging emotions, ability to interact with staff and appropriateness that alternates with severe delusions and at times aggressive and violent behaviors.  Severe insomnia -Continue Remeron and trazodone  History of polysubstance abuse with heroin/history of outpatient methadone treatment -Prescribed methadone 60 mg daily from a clinic in [place] prior to admission -Continue naltrexone to treat heroin addiction as well as recent issues with impulsivity and hypersexual behaviors during admission which are suspected related to brain injury and worsening psychiatric condition   Depression and severe anxiety disorder/PTSD/no bipolar disorder now with schizoaffective features -Extremes in behavior have significantly improved-continue IVC (last updated 2/8) -See above regarding pharmacotherapy  Recurrent  constipation/nausea -Continue Colace 100 mg twice daily prn -Continue MiraLAX to twice daily - has had multiple bowel movements since initiation of scheduled lactulose-lactulose could be contributing to ongoing nausea therefore will change to prn -As a precaution with her history of recurrent UTI will check urinalysis and culture to ensure nausea not secondary to UTI    Other problems: Status epilepticus -Resolved -Suspect precipitated by abrupt withdrawal of methadone as reported by patient -Repeat MRI on 12/28 within normal limits and it is felt behavioral issues related to brain injury sequelae and chronic psychiatric condition.  Neurology has signed off -Vimpat, Dilantin and phenobarbital have been tapered and discontinued at recommendation of neurologist  Nonobstructive transaminitis in context of hepatitis C -Elevated HCV antibody with markedly elevated HCV RNA quantitative level  consistent with chronic hepatitis C  -LFTs remain stable without an obstructive pattern noted -11/10: Discussed with ID Dr. Manson Passey. Treatment of hepatitis C typically is initiated in the outpatient setting. Medications can be quite expensive and usually take time to obtain insurance approval.  -ID recommends scheduling outpatient appointment their clinic closer to discharge date  Back pain 2/2 abnormal urinalysis consistent with UTI -Patient with low-grade fever overnight and is now having back pain for days not responsive to NSAIDs and muscle relaxers -DG lumbar spine unremarkable -During hospitalization patient has been treated for pansensitive E. coli UTI as well as Salmonella UTI both of which have been sensitive to Levaquin -Completed Levaquin 12/20-urine culture obtained after antibiotics initiated and showed no growth -CT abdomen and pelvis on 12/17 unremarkable  Salmonella UTI -Completed amoxicillin  Abnormal urinalysis -Not consistent with UTI, was positive for moderate hemoglobin and  greater than 50 RBCs with an elevated specific gravity -Suspect dehydration -Patient denies back pain or history of renal calculi although given her confusion unclear if this history is accurate  Physical deconditioning -Resolved -Does continue to exhibit impulsivity but maintains appropriate balance and gait no longer requires one-to-one safety sitter  Large hemorrhoids. -Resolved -Markedly improved-LD Proctofoam 12/15  Dysphagia -Resolved -Continue regular diet   Data Reviewed: Basic Metabolic Panel: No results for input(s): NA, K, CL, CO2, GLUCOSE, BUN, CREATININE, CALCIUM, MG, PHOS in the last 168 hours. Liver Function Tests: No results for input(s): AST, ALT, ALKPHOS, BILITOT, PROT, ALBUMIN in the last 168 hours. No results for input(s): LIPASE, AMYLASE in the last 168 hours. No results for input(s): AMMONIA in the last 168 hours. CBC: No results for input(s): WBC, NEUTROABS, HGB, HCT, MCV, PLT in the last 168 hours. Cardiac Enzymes: No results for input(s): CKTOTAL, CKMB, CKMBINDEX, TROPONINI in the last 168 hours. BNP (last 3 results) No results for input(s): BNP in the last 8760 hours.  ProBNP (last 3 results) No results for input(s): PROBNP in the last 8760 hours.  CBG: No results for input(s): GLUCAP in the last 168 hours.  Recent Results (from the past 240 hour(s))  Culture, Urine     Status: Abnormal   Collection Time: 06/03/20  2:11 AM   Specimen: Urine, Catheterized  Result Value Ref Range Status   Specimen Description URINE, CATHETERIZED  Final   Special Requests   Final    NONE Performed at [facility], [address] [place], [place] [address]    Culture MULTIPLE SPECIES PRESENT, SUGGEST RECOLLECTION (A)  Final   Report Status 06/05/2020 FINAL  Final     Studies: No results found.  Scheduled Meds: . carbamazepine  800 mg Oral TID  . citalopram  20 mg Oral Daily  . clonazePAM  1 mg Oral Q6H  . diclofenac Sodium  4 g Topical QID  .  docusate sodium  100 mg Oral BID  . feeding supplement  237 mL Oral BID BM  . melatonin  10 mg Oral QHS  . mirtazapine  30 mg Oral QHS  . multivitamin with minerals  1 tablet Oral Daily  . naltrexone  25 mg Oral Daily  . polyethylene glycol  17 g Oral BID  . vitamin B-6  100 mg Oral Daily  . thiamine  100 mg Oral Daily  . traZODone  150 mg Oral QHS  . ziprasidone  80 mg Oral 2 times per day   Continuous Infusions:   Principal Problem:   Delirium due to multiple etiologies Active Problems:   Acute metabolic encephalopathy   Hypokalemia   Polysubstance abuse (HCC)   Elevated CK   Transaminitis   Refractory seizure (HCC)   Chronic hepatitis C without hepatic coma (HCC)   Distended abdomen   Palliative care encounter   Colonic Ileus (HCC)   Organic brain syndrome   On enteral nutrition   Physical deconditioning   Protein-calorie malnutrition (HCC)   Inadequate oral nutritional intake   Impulse disorder   Consultants:  Neurology  Psychiatry  Interventional radiology  Surgery    Procedures:  9/14 lumbar puncture  9/15 EEG  9/16 EEG  9/17 EEG  9/19 overnight EEG with video  9/22 overnight EEG with video with discontinuation of long-term EEG monitoring on 9/25  9/24 core track placement  10/6 EEG  12/15 PEG tube removed    Antibiotics: Anti-infectives (From admission, onward)   Start     Dose/Rate Route Frequency Ordered Stop   05/11/20 0845  amoxicillin-clavulanate (AUGMENTIN) 500-125 MG per tablet 500 mg        1 tablet Oral Every 8 hours 05/11/20 0750 05/18/20 0559   05/11/20 0845  metroNIDAZOLE (FLAGYL) tablet 500 mg        500 mg Oral Every 8 hours 05/11/20 0750 05/18/20 0559   04/05/20 1415  levofloxacin (LEVAQUIN) tablet 500 mg        500 mg Oral Daily 04/05/20 1327 04/11/20 0911   03/19/20 1000  metroNIDAZOLE (FLAGYL) tablet 500 mg  Status:  Discontinued        500 mg Per Tube 2 times daily 03/19/20 0822 03/23/20 0827   03/18/20 1200   amoxicillin (AMOXIL) 250 MG/5ML suspension 500 mg  Status:  Discontinued        500 mg Per Tube Every 8 hours 03/18/20 0915 03/23/20 0559   03/17/20 1200  cefTRIAXone (ROCEPHIN) 1 g in sodium chloride 0.9 % 100 mL IVPB  Status:  Discontinued        1 g 200 mL/hr over 30 Minutes Intravenous Every 24 hours 03/17/20 1048 03/18/20 0913   03/16/20 1130  metroNIDAZOLE (FLAGYL) 50 mg/ml oral suspension 500 mg  Status:  Discontinued        500 mg Per Tube 2 times daily 03/16/20 1040  03/19/20 0821   03/16/20 1130  fluconazole (DIFLUCAN) 40 MG/ML suspension 152 mg        150 mg Per Tube  Once 03/16/20 1040 03/16/20 1245   02/11/20 1526  ceFAZolin (ANCEF) IVPB 2g/100 mL premix        2 g 200 mL/hr over 30 Minutes Intravenous  Once 02/11/20 1526 02/11/20 1641   02/05/20 0600  ceFAZolin (ANCEF) IVPB 2g/100 mL premix        2 g 200 mL/hr over 30 Minutes Intravenous To Short Stay 02/04/20 1054 02/05/20 0950   01/29/20 0600  ceFAZolin (ANCEF) IVPB 2g/100 mL premix        2 g 200 mL/hr over 30 Minutes Intravenous On call to O.R. 01/28/20 1511 01/30/20 0559   01/28/20 2000  cefTRIAXone (ROCEPHIN) 1 g in sodium chloride 0.9 % 100 mL IVPB        1 g 200 mL/hr over 30 Minutes Intravenous Every 24 hours 01/28/20 1925 02/02/20 0724   01/06/20 0900  cefTRIAXone (ROCEPHIN) 2 g in sodium chloride 0.9 % 100 mL IVPB  Status:  Discontinued        2 g 200 mL/hr over 30 Minutes Intravenous Every 12 hours 01/06/20 0105 01/06/20 2202   01/06/20 0800  vancomycin (VANCOCIN) IVPB 1000 mg/200 mL premix  Status:  Discontinued       "Followed by" Linked Group Details   1,000 mg 200 mL/hr over 60 Minutes Intravenous Every 12 hours 01/05/20 1904 01/06/20 2202   01/05/20 2000  vancomycin (VANCOREADY) IVPB 1500 mg/300 mL       "Followed by" Linked Group Details   1,500 mg 150 mL/hr over 120 Minutes Intravenous  Once 01/05/20 1904 01/06/20 0127   01/05/20 1830  cefTRIAXone (ROCEPHIN) 2 g in sodium chloride 0.9 % 100 mL IVPB         2 g 200 mL/hr over 30 Minutes Intravenous  Once 01/05/20 1829 01/05/20 2220       Time spent: 25 minutes    Junious SilkAllison Corbyn Wildey ANP  [facility] 7 am - 330 pm/M-F for direct patient care and secure chat Please see Amion for contact information 154 days

## 2020-06-09 NOTE — Progress Notes (Signed)
CSW spoke with Deanna at [facility] who states there are no beds available today and that patient is still on the priority waiting list.  Edwin Dada, MSW, LCSW Transitions of Care  Clinical Social Worker II [phone]

## 2020-06-09 NOTE — Progress Notes (Signed)
[facility] PROGRESS NOTE  Anita Davis UJW:[id number] DOB: 03/11/1986 DOA: 01/04/2020 PCP: Anita Hawking, PA-C    02/10/20                      02/15/20                       04/08/20    04/01/21  Status: Remains inpatient appropriate because:Altered mental status, Unsafe d/c plan and Inpatient level of care appropriate due to severity of illness   Dispo: The patient is from: Home              [facility]              Anticipated d/c date is: > 3 days              Patient currently is medically stable to d/c.  Barriers to discharge: Significant aggressive and violent outbursts have improved w/ no episodes sine 1/24. Remains on priority list for [facility]. [facility] re contacted for admit given improved behaviors-this facility is ECT capable  **1/31: Chart has been reviewed by [facility] and given patient's underlying neurological condition i.e. previous history of status epilepticus she is not an appropriate candidate for ECT.**  **2/1: [facility] staff have reevaluated this patient.  She continues to require high level inpatient psychiatric care which is not able to be provided at [facility] behavioral health facilities.  Recommendation is to continue to pursue [facility] placement. 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  Patient presented to [facility], ER in [place] several times prior to admission.  Initial presentation was related to symptoms from acute cystitis.  Two other presentations related to insomnia.  On those occasions she was able to be discharged from the ER.   She returned again on 13 September being brought in by family for altered mental status.  After admission this patient developed seizure activity which progressed to status epilepticus therefore she was transferred to [facility] for further neurological evaluation.  After several days recurrent seizures were aborted with combination of 3 antiepileptic drugs.  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  Fortunately this did not improve patient's mentation and she continued to have waxing and waning episodes of alertness and encephalopathy.  Since admission she has not had any further seizures so these medications were discontinued.  She has eventually awakened behaviors alternating between awake and appropriate, awake crying and depressed occasionally delusional, severe agitation and aggression with significant delusions.  She has been evaluated both by neurology and psychiatry who suspect a combination of sequelae from underlying brain injury as well as bipolar disorder with schizoaffective features.  During one of her alert phases patient did admit to being diagnosed with bipolar disease prior to admission.  Patient is on multiple psychotropic medications continued to have episodes of violent outbursts.  She had required frequent doses of IM Geodon as well as Recruitment consultant and frequent intervention by security staff during her episodes of severe agitation.  Recent chart review by [facility] states patient would  not be a candidate for ECT given recent brain injury sequela as well as presentation with status epilepticus.  Anita Davis had been remaining stable regarding extreme agitation and/or violent behaviors since 1/24 when she had received her last Geodon IM dose.  On the evening of 2/4 she was also given a dose of IM Geodon.  There was no chart documentation stating what type of behavior she was experiencing  but typically this medication is only given if she has extreme agitation.  Subjective: Awake and continues with flat affect with minimal verbal response when engaged.  Denied any complaints when asked.  Updated again regarding discharge plans to higher level psychiatric facility.  Patient answering orientation questions when asked and was quite forthright stating that she really did not know what the year was but she thought it was 2021.  Objective: Vitals:   06/08/20 2025 06/09/20 0615  BP: 114/68 106/77  Pulse: 72 60  Resp: 18 16  Temp: 98.3 F (36.8 C) 98 F (36.7 C)  SpO2: 99% 97%    Intake/Output Summary (Last 24 hours) at 06/09/2020 0254 Last data filed at 06/09/2020 0500 Gross per 24 hour  Intake 620 ml  Output --  Net 620 ml   Filed Weights   06/06/20 0227 06/08/20 0359 06/09/20 0650  Weight: 78.3 kg 78.2 kg 81.1 kg    Exam: General: Awake with flat affect but in no acute distress Pulmonary:  Lungs clear and she is stable on room air Cardiac:  Normal heart sounds, no tachycardia, no peripheral edema Abdomen: Soft nontender nondistended.  Normoactive bowel sounds.  Eating well LBM 2/12 Neurological:  Cranial nerves II through XII grossly intact, no focal neurological deficits and no gait disturbance Psychiatric:, Oriented to name and place but not to year.  Uncertain after situation since typically does not have insight into her current psychiatric issues.   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  During periods of alertness and lucidity patient denied use of methamphetamines prior to admission although did admit to abruptly stopping her methadone -Continue max dose Geodon 80 mg BID, naltrexone, scheduled Klonopin, Tegretol, Celexa and Remeron at hour of sleep -Continue Tegretol to 800 mg TID as of 1/29 with improvement in reports of severe anxiety.  LFTs remain stable with AST 70, ALT 63 with normal total bilirubin -Given recurrent worsening of depressive symptoms will decrease Klonopin back down to 1 mg 4 times daily.  If insomnia worsens can change nighttime dose to 2 mg. -Patient had also been on clonidine as an adjunct to antianxiety treatment but given low blood pressure readings with some mild dizziness this was discontinued on 2/13 -Continue 1:1 safety sitter  -Appreciate assistance of psych team. It appears we have maxed out on all psychotropic medications for this patient.  It is noted that many of these medications will take weeks to months for full therapeutic benefit.  Given her persistent severe behavioral swings that include violent episodes of physically attacking staff it has been determined that patient would benefit from aggressive psychiatric treatment at [facility]  --Continue IM Geodon every 8-12 hours as needed severe agitation -2/4 patient in great  spirits, more alert and oriented and interactive with staff as documented above.  Again she has demonstrated wide-ranging emotions, ability to interact with staff and appropriateness that alternates with severe delusions and at times aggressive and violent behaviors.  Severe insomnia -Continue Remeron and trazodone   History of polysubstance abuse with heroin/history of outpatient methadone treatment -Prescribed methadone 60 mg daily from a clinic in [place] prior to admission -Continue naltrexone to treat heroin addiction as well as recent issues with impulsivity and hypersexual behaviors during admission which are suspected  related to brain injury and worsening psychiatric condition   Depression and severe anxiety disorder/PTSD/no bipolar disorder now with schizoaffective features -Extremes in behavior have significantly improved-continue IVC (last updated 2/8) -See above regarding pharmacotherapy  Recurrent constipation/nausea -Continue Colace 100 mg twice daily prn -Continue MiraLAX to twice daily - has had multiple bowel movements since initiation of scheduled lactulose-lactulose could be contributing to ongoing nausea therefore will change to prn -As a precaution with her history of recurrent UTI will check urinalysis and culture to ensure nausea not secondary to UTI    Other problems: Status epilepticus -Resolved -Suspect precipitated by abrupt withdrawal of methadone as reported by patient -Repeat MRI on 12/28 within normal limits and it is felt behavioral issues related to brain injury sequelae and chronic psychiatric condition.  Neurology has signed off -Vimpat, Dilantin and phenobarbital have been tapered and discontinued at recommendation of neurologist  Nonobstructive transaminitis in context of hepatitis C -Elevated HCV antibody with markedly elevated HCV RNA quantitative level  consistent with chronic hepatitis C  -LFTs remain stable without an obstructive pattern noted -11/10: Discussed with ID Dr. Manson Passey. Treatment of hepatitis C typically is initiated in the outpatient setting. Medications can be quite expensive and usually take time to obtain insurance approval.  -ID recommends scheduling outpatient appointment their clinic closer to discharge date  Back pain 2/2 abnormal urinalysis consistent with UTI -Patient with low-grade fever overnight and is now having back pain for days not responsive to NSAIDs and muscle relaxers -DG lumbar spine unremarkable -During hospitalization patient has been treated for pansensitive E. coli UTI as well as Salmonella UTI both of which have been  sensitive to Levaquin -Completed Levaquin 12/20-urine culture obtained after antibiotics initiated and showed no growth -CT abdomen and pelvis on 12/17 unremarkable  Salmonella UTI -Completed amoxicillin  Abnormal urinalysis -Not consistent with UTI, was positive for moderate hemoglobin and greater than 50 RBCs with an elevated specific gravity -Suspect dehydration -Patient denies back pain or history of renal calculi although given her confusion unclear if this history is accurate  Physical deconditioning -Resolved -Does continue to exhibit impulsivity but maintains appropriate balance and gait no longer requires one-to-one safety sitter  Large hemorrhoids. -Resolved -Markedly improved-LD Proctofoam 12/15  Dysphagia -Resolved -Continue regular diet   Data Reviewed: Basic Metabolic Panel: No results for input(s): NA, K, CL, CO2, GLUCOSE, BUN, CREATININE, CALCIUM, MG, PHOS in the last 168 hours. Liver Function Tests: No results for input(s): AST, ALT, ALKPHOS, BILITOT, PROT, ALBUMIN in the last 168 hours. No results for input(s): LIPASE, AMYLASE in the last 168 hours. No results for input(s): AMMONIA in the last 168 hours. CBC: No results for input(s): WBC, NEUTROABS, HGB, HCT, MCV, PLT in the last 168 hours. Cardiac Enzymes: No results for input(s): CKTOTAL, CKMB, CKMBINDEX, TROPONINI in the last 168 hours. BNP (last 3 results) No results for input(s): BNP in the last 8760 hours.  ProBNP (last 3 results) No results for input(s): PROBNP in  the last 8760 hours.  CBG: No results for input(s): GLUCAP in the last 168 hours.  Recent Results (from the past 240 hour(s))  Culture, Urine     Status: Abnormal   Collection Time: 06/03/20  2:11 AM   Specimen: Urine, Catheterized  Result Value Ref Range Status   Specimen Description URINE, CATHETERIZED  Final   Special Requests   Final    NONE Performed at [facility], [address], [place], [place] [address]     Culture MULTIPLE SPECIES PRESENT, SUGGEST RECOLLECTION (A)  Final   Report Status 06/05/2020 FINAL  Final     Studies: No results found.  Scheduled Meds: . carbamazepine  800 mg Oral TID  . citalopram  20 mg Oral Daily  . clonazePAM  1 mg Oral Q6H  . diclofenac Sodium  4 g Topical QID  . docusate sodium  100 mg Oral BID  . feeding supplement  237 mL Oral BID BM  . melatonin  10 mg Oral QHS  . mirtazapine  30 mg Oral QHS  . multivitamin with minerals  1 tablet Oral Daily  . naltrexone  25 mg Oral Daily  . polyethylene glycol  17 g Oral BID  . vitamin B-6  100 mg Oral Daily  . thiamine  100 mg Oral Daily  . traZODone  150 mg Oral QHS  . ziprasidone  80 mg Oral 2 times per day   Continuous Infusions:   Principal Problem:   Delirium due to multiple etiologies Active Problems:   Acute metabolic encephalopathy   Hypokalemia   Polysubstance abuse (HCC)   Elevated CK   Transaminitis   Refractory seizure (HCC)   Chronic hepatitis C without hepatic coma (HCC)   Distended abdomen   Palliative care encounter   Colonic Ileus (HCC)   Organic brain syndrome   On enteral nutrition   Physical deconditioning   Protein-calorie malnutrition (HCC)   Inadequate oral nutritional intake   Impulse disorder   Consultants:  Neurology  Psychiatry  Interventional radiology  Surgery    Procedures:  9/14 lumbar puncture  9/15 EEG  9/16 EEG  9/17 EEG  9/19 overnight EEG with video  9/22 overnight EEG with video with discontinuation of long-term EEG monitoring on 9/25  9/24 core track placement  10/6 EEG  12/15 PEG tube removed    Antibiotics: Anti-infectives (From admission, onward)   Start     Dose/Rate Route Frequency Ordered Stop   05/11/20 0845  amoxicillin-clavulanate (AUGMENTIN) 500-125 MG per tablet 500 mg        1 tablet Oral Every 8 hours 05/11/20 0750 05/18/20 0559   05/11/20 0845  metroNIDAZOLE (FLAGYL) tablet 500 mg        500 mg Oral Every 8 hours  05/11/20 0750 05/18/20 0559   04/05/20 1415  levofloxacin (LEVAQUIN) tablet 500 mg        500 mg Oral Daily 04/05/20 1327 04/11/20 0911   03/19/20 1000  metroNIDAZOLE (FLAGYL) tablet 500 mg  Status:  Discontinued        500 mg Per Tube 2 times daily 03/19/20 0822 03/23/20 0827   03/18/20 1200  amoxicillin (AMOXIL) 250 MG/5ML suspension 500 mg  Status:  Discontinued        500 mg Per Tube Every 8 hours 03/18/20 0915 03/23/20 0559   03/17/20 1200  cefTRIAXone (ROCEPHIN) 1 g in sodium chloride 0.9 % 100 mL IVPB  Status:  Discontinued        1  g 200 mL/hr over 30 Minutes Intravenous Every 24 hours 03/17/20 1048 03/18/20 0913   03/16/20 1130  metroNIDAZOLE (FLAGYL) 50 mg/ml oral suspension 500 mg  Status:  Discontinued        500 mg Per Tube 2 times daily 03/16/20 1040 03/19/20 0821   03/16/20 1130  fluconazole (DIFLUCAN) 40 MG/ML suspension 152 mg        150 mg Per Tube  Once 03/16/20 1040 03/16/20 1245   02/11/20 1526  ceFAZolin (ANCEF) IVPB 2g/100 mL premix        2 g 200 mL/hr over 30 Minutes Intravenous  Once 02/11/20 1526 02/11/20 1641   02/05/20 0600  ceFAZolin (ANCEF) IVPB 2g/100 mL premix        2 g 200 mL/hr over 30 Minutes Intravenous To Short Stay 02/04/20 1054 02/05/20 0950   01/29/20 0600  ceFAZolin (ANCEF) IVPB 2g/100 mL premix        2 g 200 mL/hr over 30 Minutes Intravenous On call to O.R. 01/28/20 1511 01/30/20 0559   01/28/20 2000  cefTRIAXone (ROCEPHIN) 1 g in sodium chloride 0.9 % 100 mL IVPB        1 g 200 mL/hr over 30 Minutes Intravenous Every 24 hours 01/28/20 1925 02/02/20 0724   01/06/20 0900  cefTRIAXone (ROCEPHIN) 2 g in sodium chloride 0.9 % 100 mL IVPB  Status:  Discontinued        2 g 200 mL/hr over 30 Minutes Intravenous Every 12 hours 01/06/20 0105 01/06/20 2202   01/06/20 0800  vancomycin (VANCOCIN) IVPB 1000 mg/200 mL premix  Status:  Discontinued       "Followed by" Linked Group Details   1,000 mg 200 mL/hr over 60 Minutes Intravenous Every 12 hours  01/05/20 1904 01/06/20 2202   01/05/20 2000  vancomycin (VANCOREADY) IVPB 1500 mg/300 mL       "Followed by" Linked Group Details   1,500 mg 150 mL/hr over 120 Minutes Intravenous  Once 01/05/20 1904 01/06/20 0127   01/05/20 1830  cefTRIAXone (ROCEPHIN) 2 g in sodium chloride 0.9 % 100 mL IVPB        2 g 200 mL/hr over 30 Minutes Intravenous  Once 01/05/20 1829 01/05/20 2220       Time spent: 25 minutes    Junious SilkAllison Bristyn Kulesza ANP  [facility] 7 am - 330 pm/M-F for direct patient care and secure chat Please see Amion for contact information 155 days

## 2020-06-10 MED ORDER — HYDROCORTISONE 1 % EX CREA
TOPICAL_CREAM | Freq: Two times a day (BID) | CUTANEOUS | Status: DC
  Filled 2020-06-10: qty 28
  Filled 2020-06-10: qty 28.35

## 2020-06-10 NOTE — Plan of Care (Signed)
?  Problem: Clinical Measurements: ?Goal: Will remain free from infection ?Outcome: Progressing ?  ?

## 2020-06-10 NOTE — Progress Notes (Addendum)
[facility] PROGRESS NOTE  Anita Davis IRJ:[id number] DOB: 1985/06/23 DOA: 01/04/2020 PCP: Anita Hawking, PA-C    02/10/20                      02/15/20                       04/08/20    04/01/21  Status: Remains inpatient appropriate because:Altered mental status, Unsafe d/c plan and Inpatient level of care appropriate due to severity of illness   Dispo: The patient is from: Home              [facility]              Anticipated d/c date is: > 3 days              Patient currently is medically stable to d/c.  Barriers to discharge: Significant aggressive and violent outbursts have improved w/ no episodes sine 1/24. Remains on priority list for [facility].  Father in process of obtaining guardianship  **1/31: Chart has been reviewed by Anita Davis and given patient's underlying neurological condition i.e. previous history of status epilepticus she is not an appropriate candidate for ECT.**  **2/1: Anita Davis health staff have reevaluated this patient.  She continues to require high level inpatient psychiatric care which is not able to be provided at [facility] behavioral [facility] facilities.  Recommendation is to continue to pursue [facility] placement. 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  Patient presented to [facility], ER in [place] several times prior to admission.  Initial presentation was related to symptoms from acute cystitis.  Two other presentations related to insomnia.  On those occasions she was able to be discharged from the ER.  She returned again on 13 September  being brought in by family for altered mental status.  After admission this patient developed seizure activity which progressed to status epilepticus therefore she was transferred to [facility] for further neurological evaluation.  After several days recurrent seizures were aborted with combination of 3 antiepileptic drugs.  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  Fortunately this did not improve patient's mentation and she continued to have waxing and waning episodes of alertness and encephalopathy.  Since admission she has not had any further seizures so these medications were discontinued.  She has eventually awakened behaviors alternating between awake and appropriate, awake crying and depressed occasionally delusional, severe agitation and aggression with significant delusions.  She has been evaluated both by neurology and psychiatry who suspect a combination of sequelae from underlying brain injury as well as bipolar disorder with schizoaffective features.  During one of her alert phases patient did admit to being diagnosed with bipolar disease prior to admission.  Patient is on multiple psychotropic medications continued to have episodes of violent outbursts.  She had required frequent doses of IM Geodon as well as Recruitment consultant and frequent intervention by security staff during her episodes of severe agitation.  Recent chart review by [facility] states patient would not be a candidate for  ECT given recent brain injury sequela as well as presentation with status epilepticus.  Anita Davis**Anita Davis had been remaining stable regarding extreme agitation and/or violent behaviors since 1/24 when she had received her last Geodon IM dose.  On the evening of 2/4 she was also given a dose of IM Geodon.  There was no chart documentation stating what type of behavior she was experiencing but typically this medication is  only given if she has extreme agitation.  Subjective: Awake with very sad affect.  No tears in patient's eyes.  Asked her how she was feeling today and initially did not respond.  Later when I asked her if she was sad she replied yes then stated that no one loved her.  Attempted to support patient emotionally and states that people did love her but she continued to state no no one does.  It is noted that she had taken a magic marker and had drawn horizontal lines across her wrists.  Objective: Vitals:   06/09/20 1951 06/10/20 0428  BP: 110/70 105/68  Pulse: 69 72  Resp: 17 16  Temp: 98.1 F (36.7 C) 98 F (36.7 C)  SpO2: 98% 98%    Intake/Output Summary (Last 24 hours) at 06/10/2020 0830 Last data filed at 06/10/2020 0502 Gross per 24 hour  Intake 240 ml  Output -  Net 240 ml   Filed Weights   06/08/20 0359 06/09/20 0650 06/10/20 0428  Weight: 78.2 kg 81.1 kg 81.1 kg    Exam: General: Awake with sad affect with noted tears in eyes ENT: Patient has flat macular dry patch that is nonpruritic over her left eye just below eyebrow Pulmonary:  Lungs are clear, stable on room air Cardiac:  S1-S2, pulse regular nontachycardic, normotensive, no peripheral edema Abdomen: Soft with normoactive bowel sounds.  Typically eats well LBM 2/12 Neurological:  Cranial nerves II through XII grossly intact, no focal neurological deficits and no gait disturbance Psychiatric: Alert and oriented to name only.  Very sad and somewhat delusional.  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 During periods of alertness and lucidity patient denied use of methamphetamines prior to admission although did admit to abruptly stopping her methadone -Continue max dose Geodon 80 mg BID, naltrexone, scheduled Klonopin, Tegretol, Celexa and Remeron at hour of sleep -Continue Tegretol to 800 mg TID as of 1/29 with improvement in reports of severe anxiety. -Continue 1:1 safety sitter  -Appreciate assistance of psych team. It appears we have maxed out on all psychotropic medications for this patient.  It is noted that many of these medications will take weeks to months for full therapeutic benefit.  Given her persistent severe behavioral swings that include violent episodes of physically attacking staff it has been determined that [facility]  --Continue IM Geodon every 8-12 hours as needed severe agitation  ?  Eczema left supraorbital region -Begin low-dose cortisone cream and follow -Patient denies itching  Severe insomnia -Continue Remeron and trazodone   History of polysubstance abuse with heroin/history  of outpatient methadone treatment -Prescribed methadone 60 mg daily from a clinic in [place] prior to admission -Continue naltrexone to treat heroin addiction as well as recent issues with impulsivity and hypersexual behaviors during admission which are suspected related to brain injury and worsening psychiatric condition   Depression and severe anxiety disorder/PTSD/no bipolar disorder now with schizoaffective features -Extremes in behavior have significantly improved-continue IVC (last updated 2/8) -See above regarding pharmacotherapy  Recurrent constipation/nausea -Continue Colace 100 mg twice daily prn -Continue MiraLAX to twice daily - has had multiple bowel  movements since initiation of scheduled lactulose-lactulose could be contributing to ongoing nausea therefore will change to prn -As a precaution with her history of recurrent UTI will check urinalysis and culture to ensure nausea not secondary to UTI    Other problems: Status epilepticus -Resolved -Suspect precipitated by abrupt withdrawal of methadone as reported by patient -Repeat MRI on 12/28 within normal limits and it is felt behavioral issues related to brain injury sequelae and chronic psychiatric condition.  Neurology has signed off -Vimpat, Dilantin and phenobarbital have been tapered and discontinued at recommendation of neurologist  Nonobstructive transaminitis in context of hepatitis C -Elevated HCV antibody with markedly elevated HCV RNA quantitative level  consistent with chronic hepatitis C  -LFTs remain stable without an obstructive pattern noted -11/10: Discussed with ID Dr. Manson Passey. Treatment of hepatitis C typically is initiated in the outpatient setting. Medications can be quite expensive and usually take time to obtain insurance approval.  -ID recommends scheduling outpatient appointment their clinic closer to discharge date  Back pain 2/2 abnormal urinalysis consistent with UTI -Patient with low-grade fever overnight and is now having back pain for days not responsive to NSAIDs and muscle relaxers -DG lumbar spine unremarkable -During hospitalization patient has been treated for pansensitive E. coli UTI as well as Salmonella UTI both of which have been sensitive to Levaquin -Completed Levaquin 12/20-urine culture obtained after antibiotics initiated and showed no growth -CT abdomen and pelvis on 12/17 unremarkable  Salmonella UTI -Completed amoxicillin  Abnormal urinalysis -Not consistent with UTI, was positive for moderate hemoglobin and greater than 50 RBCs with an elevated specific gravity -Suspect dehydration -Patient denies back pain or history of  renal calculi although given her confusion unclear if this history is accurate  Physical deconditioning -Resolved -Does continue to exhibit impulsivity but maintains appropriate balance and gait no longer requires one-to-one safety sitter  Large hemorrhoids. -Resolved -Markedly improved-LD Proctofoam 12/15  Dysphagia -Resolved -Continue regular diet   Data Reviewed: Basic Metabolic Panel: No results for input(s): NA, K, CL, CO2, GLUCOSE, BUN, CREATININE, CALCIUM, MG, PHOS in the last 168 hours. Liver Function Tests: No results for input(s): AST, ALT, ALKPHOS, BILITOT, PROT, ALBUMIN in the last 168 hours. No results for input(s): LIPASE, AMYLASE in the last 168 hours. No results for input(s): AMMONIA in the last 168 hours. CBC: No results for input(s): WBC, NEUTROABS, HGB, HCT, MCV, PLT in the last 168 hours. Cardiac Enzymes: No results for input(s): CKTOTAL, CKMB, CKMBINDEX, TROPONINI in the last 168 hours. BNP (last 3 results) No results for input(s): BNP in the last 8760 hours.  ProBNP (last 3 results) No results for input(s): PROBNP in the last 8760 hours.  CBG: No results for input(s): GLUCAP in the last 168 hours.  Recent Results (from the past 240 hour(s))  Culture, Urine     Status: Abnormal   Collection Time: 06/03/20  2:11 AM   Specimen: Urine, Catheterized  Result Value Ref Range Status  Specimen Description URINE, CATHETERIZED  Final   Special Requests   Final    NONE Performed at [facility], [address] [place], [place] [address]    Culture MULTIPLE SPECIES PRESENT, SUGGEST RECOLLECTION (A)  Final   Report Status 06/05/2020 FINAL  Final     Studies: No results found.  Scheduled Meds: . carbamazepine  800 mg Oral TID  . citalopram  20 mg Oral Daily  . clonazePAM  1 mg Oral Q6H  . diclofenac Sodium  4 g Topical QID  . docusate sodium  100 mg Oral BID  . feeding supplement  237 mL Oral BID BM  . melatonin  10 mg Oral QHS  .  mirtazapine  30 mg Oral QHS  . multivitamin with minerals  1 tablet Oral Daily  . naltrexone  25 mg Oral Daily  . polyethylene glycol  17 g Oral BID  . vitamin B-6  100 mg Oral Daily  . thiamine  100 mg Oral Daily  . traZODone  150 mg Oral QHS  . ziprasidone  80 mg Oral 2 times per day   Continuous Infusions:   Principal Problem:   Delirium due to multiple etiologies Active Problems:   Acute metabolic encephalopathy   Hypokalemia   Polysubstance abuse (HCC)   Elevated CK   Transaminitis   Refractory seizure (HCC)   Chronic hepatitis C without hepatic coma (HCC)   Distended abdomen   Palliative care encounter   Colonic Ileus (HCC)   Organic brain syndrome   On enteral nutrition   Physical deconditioning   Protein-calorie malnutrition (HCC)   Inadequate oral nutritional intake   Impulse disorder   Consultants:  Neurology  Psychiatry  Interventional radiology  Surgery    Procedures:  9/14 lumbar puncture  9/15 EEG  9/16 EEG  9/17 EEG  9/19 overnight EEG with video  9/22 overnight EEG with video with discontinuation of long-term EEG monitoring on 9/25  9/24 core track placement  10/6 EEG  12/15 PEG tube removed    Antibiotics: Anti-infectives (From admission, onward)   Start     Dose/Rate Route Frequency Ordered Stop   05/11/20 0845  amoxicillin-clavulanate (AUGMENTIN) 500-125 MG per tablet 500 mg        1 tablet Oral Every 8 hours 05/11/20 0750 05/18/20 0559   05/11/20 0845  metroNIDAZOLE (FLAGYL) tablet 500 mg        500 mg Oral Every 8 hours 05/11/20 0750 05/18/20 0559   04/05/20 1415  levofloxacin (LEVAQUIN) tablet 500 mg        500 mg Oral Daily 04/05/20 1327 04/11/20 0911   03/19/20 1000  metroNIDAZOLE (FLAGYL) tablet 500 mg  Status:  Discontinued        500 mg Per Tube 2 times daily 03/19/20 0822 03/23/20 0827   03/18/20 1200  amoxicillin (AMOXIL) 250 MG/5ML suspension 500 mg  Status:  Discontinued        500 mg Per Tube Every 8 hours  03/18/20 0915 03/23/20 0559   03/17/20 1200  cefTRIAXone (ROCEPHIN) 1 g in sodium chloride 0.9 % 100 mL IVPB  Status:  Discontinued        1 g 200 mL/hr over 30 Minutes Intravenous Every 24 hours 03/17/20 1048 03/18/20 0913   03/16/20 1130  metroNIDAZOLE (FLAGYL) 50 mg/ml oral suspension 500 mg  Status:  Discontinued        500 mg Per Tube 2 times daily 03/16/20 1040 03/19/20 0821   03/16/20 1130  fluconazole (DIFLUCAN) 40 MG/ML suspension 152 mg        150 mg Per Tube  Once 03/16/20 1040 03/16/20 1245   02/11/20 1526  ceFAZolin (ANCEF) IVPB 2g/100 mL premix        2 g 200 mL/hr over 30 Minutes Intravenous  Once 02/11/20 1526 02/11/20 1641   02/05/20 0600  ceFAZolin (ANCEF) IVPB 2g/100 mL premix        2 g 200 mL/hr over 30 Minutes Intravenous To Short Stay 02/04/20 1054 02/05/20 0950   01/29/20 0600  ceFAZolin (ANCEF) IVPB 2g/100 mL premix        2 g 200 mL/hr over 30 Minutes Intravenous On call to O.R. 01/28/20 1511 01/30/20 0559   01/28/20 2000  cefTRIAXone (ROCEPHIN) 1 g in sodium chloride 0.9 % 100 mL IVPB        1 g 200 mL/hr over 30 Minutes Intravenous Every 24 hours 01/28/20 1925 02/02/20 0724   01/06/20 0900  cefTRIAXone (ROCEPHIN) 2 g in sodium chloride 0.9 % 100 mL IVPB  Status:  Discontinued        2 g 200 mL/hr over 30 Minutes Intravenous Every 12 hours 01/06/20 0105 01/06/20 2202   01/06/20 0800  vancomycin (VANCOCIN) IVPB 1000 mg/200 mL premix  Status:  Discontinued       "Followed by" Linked Group Details   1,000 mg 200 mL/hr over 60 Minutes Intravenous Every 12 hours 01/05/20 1904 01/06/20 2202   01/05/20 2000  vancomycin (VANCOREADY) IVPB 1500 mg/300 mL       "Followed by" Linked Group Details   1,500 mg 150 mL/hr over 120 Minutes Intravenous  Once 01/05/20 1904 01/06/20 0127   01/05/20 1830  cefTRIAXone (ROCEPHIN) 2 g in sodium chloride 0.9 % 100 mL IVPB        2 g 200 mL/hr over 30 Minutes Intravenous  Once 01/05/20 1829 01/05/20 2220       Time spent: 25  minutes    [name] ANP  [facility] 7 am - 330 pm/M-F for direct patient care and secure chat Please see Amion for contact information 156 days

## 2020-06-10 NOTE — Progress Notes (Signed)
[facility] and that patient is still on the priority waiting list.  Anita Mcadory, MSW, LCSW Transitions of Care  Clinical Social Worker II [phone]  

## 2020-06-11 NOTE — Progress Notes (Signed)
[facility] PROGRESS NOTE  Anita Davis IRJ:[id number] DOB: 1985/06/23 DOA: 01/04/2020 PCP: [name], PA-C    02/10/20                      02/15/20                       04/08/20    04/01/21  Status: Remains inpatient appropriate because:Altered mental status, Unsafe d/c plan and Inpatient level of care appropriate due to severity of illness   Dispo: The patient is from: Home              [facility]              Anticipated d/c date is: > 3 days              Patient currently is medically stable to d/c.  Barriers to discharge: Significant aggressive and violent outbursts have improved w/ no episodes sine 1/24. Remains on priority list for [facility].  Father in process of obtaining guardianship  **1/31: Chart has been reviewed by [facility] and given patient's underlying neurological condition i.e. previous history of status epilepticus she is not an appropriate candidate for ECT.**  **2/1: [facility] staff have reevaluated this patient.  She continues to require high level inpatient psychiatric care which is not able to be provided at [facility] behavioral health facilities.  Recommendation is to continue to pursue [facility] placement. 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  Patient presented to [facility], ER in [place] several times prior to admission.  Initial presentation was related to symptoms from acute cystitis.  Two other presentations related to insomnia.  On those occasions she was able to be discharged from the ER.  She returned again on 13 September  being brought in by family for altered mental status.  After admission this patient developed seizure activity which progressed to status epilepticus therefore she was transferred to [facility] for further neurological evaluation.  After several days recurrent seizures were aborted with combination of 3 antiepileptic drugs.  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  Fortunately this did not improve patient's mentation and she continued to have waxing and waning episodes of alertness and encephalopathy.  Since admission she has not had any further seizures so these medications were discontinued.  She has eventually awakened behaviors alternating between awake and appropriate, awake crying and depressed occasionally delusional, severe agitation and aggression with significant delusions.  She has been evaluated both by neurology and psychiatry who suspect a combination of sequelae from underlying brain injury as well as bipolar disorder with schizoaffective features.  During one of her alert phases patient did admit to being diagnosed with bipolar disease prior to admission.  Patient is on multiple psychotropic medications continued to have episodes of violent outbursts.  She had required frequent doses of IM Geodon as well as Recruitment consultant and frequent intervention by security staff during her episodes of severe agitation.  Recent chart review by [facility] states patient would not be a candidate for  ECT given recent brain injury sequela as well as presentation with status epilepticus.  [name]**[name] had been remaining stable regarding extreme agitation and/or violent behaviors since 1/24 when she had received her last Geodon IM dose.  On the evening of 2/4 she was also given a dose of IM Geodon.  There was no chart documentation stating what type of behavior she was experiencing but typically this medication is  only given if she has extreme agitation.  Subjective:  She is sleepy, does not engage in conversation today, sister at bedside Per previous documentation ,  It is noted that she had taken a magic marker and had drawn horizontal lines across her wrists.  Objective: Vitals:   06/10/20 2115 06/11/20 0330  BP: 125/87 117/81  Pulse: 68 72  Resp: 18 18  Temp:  98.2 F (36.8 C)  SpO2: 98% 98%    Intake/Output Summary (Last 24 hours) at 06/11/2020 1330 Last data filed at 06/11/2020 0747 Gross per 24 hour  Intake 1240 ml  Output --  Net 1240 ml   Filed Weights   06/09/20 0650 06/10/20 0428 06/11/20 0330  Weight: 81.1 kg 81.1 kg 81.3 kg    Exam: General: Sleepy, does not engage in conversation ENT: Patient has flat macular dry patch that is nonpruritic over her left eye just below eyebrow Pulmonary:  Lungs are clear, stable on room air Cardiac:  S1-S2, pulse regular nontachycardic, normotensive, no peripheral edema Abdomen: Soft with normoactive bowel sounds.  Typically eats well LBM 2/12 Neurological:  Cranial nerves II through XII grossly intact, no focal neurological deficits and no gait disturbance Psychiatric: Alert and oriented to name only.  Very sad and somewhat delusional.  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 During periods of alertness and lucidity patient denied use of methamphetamines  prior to admission although did admit to abruptly stopping her methadone -Continue max dose Geodon 80 mg BID, naltrexone, scheduled Klonopin, Tegretol, Celexa and Remeron at hour of sleep -Continue Tegretol to 800 mg TID as of 1/29 with improvement in reports of severe anxiety. -Continue 1:1 safety sitter  -Appreciate assistance of psych team. It appears we have maxed out on all psychotropic medications for this patient.  It is noted that many of these medications will take weeks to months for full therapeutic benefit.  Given her persistent severe behavioral swings that include violent episodes of physically attacking staff it has been determined that patient would benefit [facility]  --Continue IM Geodon every 8-12 hours as needed severe agitation  ?  Eczema left supraorbital region -Begin low-dose cortisone cream and follow -Patient denies itching  Severe insomnia -Continue Remeron and trazodone   History of polysubstance abuse with heroin/history of outpatient methadone treatment -Prescribed methadone 60 mg daily from a clinic in [place] prior to admission -Continue naltrexone to treat heroin addiction as well as recent issues with impulsivity and hypersexual behaviors during admission which are suspected related to brain injury and worsening psychiatric condition   Depression and severe anxiety disorder/PTSD/no bipolar disorder  now with schizoaffective features -Extremes in behavior have significantly improved-continue IVC (last updated 2/8) -See above regarding pharmacotherapy  Recurrent constipation/nausea -Continue Colace 100 mg twice daily prn -Continue MiraLAX to twice daily - has had multiple bowel movements since initiation of scheduled lactulose-lactulose could be contributing to ongoing nausea therefore will change to prn -As a precaution with her history of recurrent UTI will check urinalysis and culture to ensure nausea not secondary to  UTI    Other problems: Status epilepticus -Resolved -Suspect precipitated by abrupt withdrawal of methadone as reported by patient -Repeat MRI on 12/28 within normal limits and it is felt behavioral issues related to brain injury sequelae and chronic psychiatric condition.  Neurology has signed off -Vimpat, Dilantin and phenobarbital have been tapered and discontinued at recommendation of neurologist  Nonobstructive transaminitis in context of hepatitis C -Elevated HCV antibody with markedly elevated HCV RNA quantitative level  consistent with chronic hepatitis C  -LFTs remain stable without an obstructive pattern noted -11/10: Discussed with ID Dr. Manson Passey. Treatment of hepatitis C typically is initiated in the outpatient setting. Medications can be quite expensive and usually take time to obtain insurance approval.  -ID recommends scheduling outpatient appointment their clinic closer to discharge date  Back pain 2/2 abnormal urinalysis consistent with UTI -Patient with low-grade fever overnight and is now having back pain for days not responsive to NSAIDs and muscle relaxers -DG lumbar spine unremarkable -During hospitalization patient has been treated for pansensitive E. coli UTI as well as Salmonella UTI both of which have been sensitive to Levaquin -Completed Levaquin 12/20-urine culture obtained after antibiotics initiated and showed no growth -CT abdomen and pelvis on 12/17 unremarkable  Salmonella UTI -Completed amoxicillin  Abnormal urinalysis -Not consistent with UTI, was positive for moderate hemoglobin and greater than 50 RBCs with an elevated specific gravity -Suspect dehydration -Patient denies back pain or history of renal calculi although given her confusion unclear if this history is accurate  Physical deconditioning -Resolved -Does continue to exhibit impulsivity but maintains appropriate balance and gait no longer requires one-to-one safety sitter  Large  hemorrhoids. -Resolved -Markedly improved-LD Proctofoam 12/15  Dysphagia -Resolved -Continue regular diet   Data Reviewed: Basic Metabolic Panel: No results for input(s): NA, K, CL, CO2, GLUCOSE, BUN, CREATININE, CALCIUM, MG, PHOS in the last 168 hours. Liver Function Tests: No results for input(s): AST, ALT, ALKPHOS, BILITOT, PROT, ALBUMIN in the last 168 hours. No results for input(s): LIPASE, AMYLASE in the last 168 hours. No results for input(s): AMMONIA in the last 168 hours. CBC: No results for input(s): WBC, NEUTROABS, HGB, HCT, MCV, PLT in the last 168 hours. Cardiac Enzymes: No results for input(s): CKTOTAL, CKMB, CKMBINDEX, TROPONINI in the last 168 hours. BNP (last 3 results) No results for input(s): BNP in the last 8760 hours.  ProBNP (last 3 results) No results for input(s): PROBNP in the last 8760 hours.  CBG: No results for input(s): GLUCAP in the last 168 hours.  Recent Results (from the past 240 hour(s))  Culture, Urine     Status: Abnormal   Collection Time: 06/03/20  2:11 AM   Specimen: Urine, Catheterized  Result Value Ref Range Status   Specimen Description URINE, CATHETERIZED  Final   Special Requests   Final    NONE Performed at [facility], [address] [place], [place] [address]    Culture MULTIPLE SPECIES PRESENT, SUGGEST RECOLLECTION (A)  Final   Report Status 06/05/2020 FINAL  Final  Studies: No results found.  Scheduled Meds: . carbamazepine  800 mg Oral TID  . citalopram  20 mg Oral Daily  . clonazePAM  1 mg Oral Q6H  . diclofenac Sodium  4 g Topical QID  . docusate sodium  100 mg Oral BID  . feeding supplement  237 mL Oral BID BM  . hydrocortisone cream   Topical BID  . melatonin  10 mg Oral QHS  . mirtazapine  30 mg Oral QHS  . multivitamin with minerals  1 tablet Oral Daily  . naltrexone  25 mg Oral Daily  . polyethylene glycol  17 g Oral BID  . vitamin B-6  100 mg Oral Daily  . thiamine  100 mg Oral Daily  .  traZODone  150 mg Oral QHS  . ziprasidone  80 mg Oral 2 times per day   Continuous Infusions:   Principal Problem:   Delirium due to multiple etiologies Active Problems:   Acute metabolic encephalopathy   Hypokalemia   Polysubstance abuse (HCC)   Elevated CK   Transaminitis   Refractory seizure (HCC)   Chronic hepatitis C without hepatic coma (HCC)   Distended abdomen   Palliative care encounter   Colonic Ileus (HCC)   Organic brain syndrome   On enteral nutrition   Physical deconditioning   Protein-calorie malnutrition (HCC)   Inadequate oral nutritional intake   Impulse disorder   Consultants:  Neurology  Psychiatry  Interventional radiology  Surgery    Procedures:  9/14 lumbar puncture  9/15 EEG  9/16 EEG  9/17 EEG  9/19 overnight EEG with video  9/22 overnight EEG with video with discontinuation of long-term EEG monitoring on 9/25  9/24 core track placement  10/6 EEG  12/15 PEG tube removed    Antibiotics: Anti-infectives (From admission, onward)   Start     Dose/Rate Route Frequency Ordered Stop   05/11/20 0845  amoxicillin-clavulanate (AUGMENTIN) 500-125 MG per tablet 500 mg        1 tablet Oral Every 8 hours 05/11/20 0750 05/18/20 0559   05/11/20 0845  metroNIDAZOLE (FLAGYL) tablet 500 mg        500 mg Oral Every 8 hours 05/11/20 0750 05/18/20 0559   04/05/20 1415  levofloxacin (LEVAQUIN) tablet 500 mg        500 mg Oral Daily 04/05/20 1327 04/11/20 0911   03/19/20 1000  metroNIDAZOLE (FLAGYL) tablet 500 mg  Status:  Discontinued        500 mg Per Tube 2 times daily 03/19/20 0822 03/23/20 0827   03/18/20 1200  amoxicillin (AMOXIL) 250 MG/5ML suspension 500 mg  Status:  Discontinued        500 mg Per Tube Every 8 hours 03/18/20 0915 03/23/20 0559   03/17/20 1200  cefTRIAXone (ROCEPHIN) 1 g in sodium chloride 0.9 % 100 mL IVPB  Status:  Discontinued        1 g 200 mL/hr over 30 Minutes Intravenous Every 24 hours 03/17/20 1048 03/18/20  0913   03/16/20 1130  metroNIDAZOLE (FLAGYL) 50 mg/ml oral suspension 500 mg  Status:  Discontinued        500 mg Per Tube 2 times daily 03/16/20 1040 03/19/20 0821   03/16/20 1130  fluconazole (DIFLUCAN) 40 MG/ML suspension 152 mg        150 mg Per Tube  Once 03/16/20 1040 03/16/20 1245   02/11/20 1526  ceFAZolin (ANCEF) IVPB 2g/100 mL premix        2 g 200 mL/hr  over 30 Minutes Intravenous  Once 02/11/20 1526 02/11/20 1641   02/05/20 0600  ceFAZolin (ANCEF) IVPB 2g/100 mL premix        2 g 200 mL/hr over 30 Minutes Intravenous To Short Stay 02/04/20 1054 02/05/20 0950   01/29/20 0600  ceFAZolin (ANCEF) IVPB 2g/100 mL premix        2 g 200 mL/hr over 30 Minutes Intravenous On call to O.R. 01/28/20 1511 01/30/20 0559   01/28/20 2000  cefTRIAXone (ROCEPHIN) 1 g in sodium chloride 0.9 % 100 mL IVPB        1 g 200 mL/hr over 30 Minutes Intravenous Every 24 hours 01/28/20 1925 02/02/20 0724   01/06/20 0900  cefTRIAXone (ROCEPHIN) 2 g in sodium chloride 0.9 % 100 mL IVPB  Status:  Discontinued        2 g 200 mL/hr over 30 Minutes Intravenous Every 12 hours 01/06/20 0105 01/06/20 2202   01/06/20 0800  vancomycin (VANCOCIN) IVPB 1000 mg/200 mL premix  Status:  Discontinued       "Followed by" Linked Group Details   1,000 mg 200 mL/hr over 60 Minutes Intravenous Every 12 hours 01/05/20 1904 01/06/20 2202   01/05/20 2000  vancomycin (VANCOREADY) IVPB 1500 mg/300 mL       "Followed by" Linked Group Details   1,500 mg 150 mL/hr over 120 Minutes Intravenous  Once 01/05/20 1904 01/06/20 0127   01/05/20 1830  cefTRIAXone (ROCEPHIN) 2 g in sodium chloride 0.9 % 100 mL IVPB        2 g 200 mL/hr over 30 Minutes Intravenous  Once 01/05/20 1829 01/05/20 2220       Time spent: 15 minutes    Albertine Grates MD PhD FACP  [facility]  Please see Amion for contact information 157 days

## 2020-06-12 MED ORDER — DICLOFENAC SODIUM 1 % EX GEL
4.0000 g | Freq: Four times a day (QID) | CUTANEOUS | Status: DC | PRN
  Filled 2020-06-12: qty 100

## 2020-06-12 NOTE — Progress Notes (Signed)
[facility] PROGRESS NOTE  Anita Davis IRJ:[id number] DOB: 1985/06/23 DOA: 01/04/2020 PCP: [name], PA-C    02/10/20                      02/15/20                       04/08/20    04/01/21  Status: Remains inpatient appropriate because:Altered mental status, Unsafe d/c plan and Inpatient level of care appropriate due to severity of illness   Dispo: The patient is from: Home              [facility]              Anticipated d/c date is: > 3 days              Patient currently is medically stable to d/c.  Barriers to discharge: Significant aggressive and violent outbursts have improved w/ no episodes sine 1/24. Remains on priority list for [facility].  Father in process of obtaining guardianship  **1/31: Chart has been reviewed by [facility] and given patient's underlying neurological condition i.e. previous history of status epilepticus she is not an appropriate candidate for ECT.**  **2/1: [facility] staff have reevaluated this patient.  She continues to require high level inpatient psychiatric care which is not able to be provided at [facility] behavioral health facilities.  Recommendation is to continue to pursue [facility] placement. 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  Patient presented to [facility], ER in [place] several times prior to admission.  Initial presentation was related to symptoms from acute cystitis.  Two other presentations related to insomnia.  On those occasions she was able to be discharged from the ER.  She returned again on 13 September  being brought in by family for altered mental status.  After admission this patient developed seizure activity which progressed to status epilepticus therefore she was transferred to [facility] for further neurological evaluation.  After several days recurrent seizures were aborted with combination of 3 antiepileptic drugs.  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  Fortunately this did not improve patient's mentation and she continued to have waxing and waning episodes of alertness and encephalopathy.  Since admission she has not had any further seizures so these medications were discontinued.  She has eventually awakened behaviors alternating between awake and appropriate, awake crying and depressed occasionally delusional, severe agitation and aggression with significant delusions.  She has been evaluated both by neurology and psychiatry who suspect a combination of sequelae from underlying brain injury as well as bipolar disorder with schizoaffective features.  During one of her alert phases patient did admit to being diagnosed with bipolar disease prior to admission.  Patient is on multiple psychotropic medications continued to have episodes of violent outbursts.  She had required frequent doses of IM Geodon as well as Recruitment consultant and frequent intervention by security staff during her episodes of severe agitation.  Recent chart review by [facility] states patient would not be a candidate for  ECT given recent brain injury sequela as well as presentation with status epilepticus.  **[name] had been remaining stable regardinAngelica Chessmang extreme agitation and/or violent behaviors since 1/24 when she had received her last Geodon IM dose.  On the evening of 2/4 she was also given a dose of IM Geodon.  There was no chart documentation stating what type of behavior she was experiencing but typically this medication is  only given if she has extreme agitation.  Subjective:  She is awake, but does not engage in conversation, not in acute distress, sister at bedside Per previous documentation ,  It is noted that she had taken a magic marker and had drawn horizontal lines across her wrists.  Objective: Vitals:   06/11/20 1509 06/12/20 0458  BP: 110/77 106/71  Pulse: 70 70  Resp: 18 18  Temp: 98 F (36.7 C) 98.2 F (36.8 C)  SpO2: 98% 99%    Intake/Output Summary (Last 24 hours) at 06/12/2020 0741 Last data filed at 06/12/2020 [id number] Gross per 24 hour  Intake 1600 ml  Output -  Net 1600 ml   Filed Weights   06/10/20 0428 06/11/20 0330 06/12/20 0704  Weight: 81.1 kg 81.3 kg 81 kg    Exam: General: Sleepy, does not engage in conversation ENT: Patient has flat macular dry patch that is nonpruritic over her left eye just below eyebrow Pulmonary:  Lungs are clear, stable on room air Cardiac:  S1-S2, pulse regular nontachycardic, normotensive, no peripheral edema Abdomen: Soft with normoactive bowel sounds.  Typically eats well LBM 2/12 Neurological:  Cranial nerves II through XII grossly intact, no focal neurological deficits and no gait disturbance Psychiatric: Alert and oriented to name only.  Very sad and somewhat delusional.  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 During periods of alertness and lucidity patient  denied use of methamphetamines prior to admission although did admit to abruptly stopping her methadone -Continue max dose Geodon 80 mg BID, naltrexone, scheduled Klonopin, Tegretol, Celexa and Remeron at hour of sleep -Continue Tegretol to 800 mg TID as of 1/29 with improvement in reports of severe anxiety. -Continue 1:1 safety sitter  -Appreciate assistance of psych team. It appears we have maxed out on all psychotropic medications for this patient.  It is noted that many of these medications will take weeks to months for full therapeutic benefit.  Given her persistent severe behavioral swings that include violent episodes of physically attacking staff it has been determined that patient would benefit from [facility]  --Continue IM Geodon every 8-12 hours as needed severe agitation  ?  Eczema left supraorbital region -Begin low-dose cortisone cream and follow -Patient denies itching  Severe insomnia -Continue Remeron and trazodone   History of polysubstance abuse with heroin/history of outpatient methadone treatment -Prescribed methadone 60 mg daily from a clinic in [place] prior to admission -Continue naltrexone to treat heroin addiction as well as recent issues with impulsivity and hypersexual behaviors during admission which are suspected related to brain injury and worsening psychiatric condition  Depression and severe anxiety disorder/PTSD/no bipolar disorder now with schizoaffective features -Extremes in behavior have significantly improved-continue IVC (last updated 2/8) -See above regarding pharmacotherapy  Recurrent constipation/nausea -Continue Colace 100 mg twice daily prn -Continue MiraLAX to twice daily - has had multiple bowel movements since initiation of scheduled lactulose-lactulose could be contributing to ongoing nausea therefore will change to prn -As a precaution with her history of recurrent UTI will check urinalysis and culture to  ensure nausea not secondary to UTI    Other problems: Status epilepticus -Resolved -Suspect precipitated by abrupt withdrawal of methadone as reported by patient -Repeat MRI on 12/28 within normal limits and it is felt behavioral issues related to brain injury sequelae and chronic psychiatric condition.  Neurology has signed off -Vimpat, Dilantin and phenobarbital have been tapered and discontinued at recommendation of neurologist  Nonobstructive transaminitis in context of hepatitis C -Elevated HCV antibody with markedly elevated HCV RNA quantitative level  consistent with chronic hepatitis C  -LFTs remain stable without an obstructive pattern noted -11/10: Discussed with ID Dr. Manson Passey. Treatment of hepatitis C typically is initiated in the outpatient setting. Medications can be quite expensive and usually take time to obtain insurance approval.  -ID recommends scheduling outpatient appointment their clinic closer to discharge date  Back pain 2/2 abnormal urinalysis consistent with UTI -Patient with low-grade fever overnight and is now having back pain for days not responsive to NSAIDs and muscle relaxers -DG lumbar spine unremarkable -During hospitalization patient has been treated for pansensitive E. coli UTI as well as Salmonella UTI both of which have been sensitive to Levaquin -Completed Levaquin 12/20-urine culture obtained after antibiotics initiated and showed no growth -CT abdomen and pelvis on 12/17 unremarkable  Salmonella UTI -Completed amoxicillin  Abnormal urinalysis -Not consistent with UTI, was positive for moderate hemoglobin and greater than 50 RBCs with an elevated specific gravity -Suspect dehydration -Patient denies back pain or history of renal calculi although given her confusion unclear if this history is accurate  Physical deconditioning -Resolved -Does continue to exhibit impulsivity but maintains appropriate balance and gait no longer requires  one-to-one safety sitter  Large hemorrhoids. -Resolved -Markedly improved-LD Proctofoam 12/15  Dysphagia -Resolved -Continue regular diet   Data Reviewed: Basic Metabolic Panel: No results for input(s): NA, K, CL, CO2, GLUCOSE, BUN, CREATININE, CALCIUM, MG, PHOS in the last 168 hours. Liver Function Tests: No results for input(s): AST, ALT, ALKPHOS, BILITOT, PROT, ALBUMIN in the last 168 hours. No results for input(s): LIPASE, AMYLASE in the last 168 hours. No results for input(s): AMMONIA in the last 168 hours. CBC: No results for input(s): WBC, NEUTROABS, HGB, HCT, MCV, PLT in the last 168 hours. Cardiac Enzymes: No results for input(s): CKTOTAL, CKMB, CKMBINDEX, TROPONINI in the last 168 hours. BNP (last 3 results) No results for input(s): BNP in the last 8760 hours.  ProBNP (last 3 results) No results for input(s): PROBNP in the last 8760 hours.  CBG: No results for input(s): GLUCAP in the last 168 hours.  Recent Results (from the past 240 hour(s))  Culture, Urine     Status: Abnormal   Collection Time: 06/03/20  2:11 AM   Specimen: Urine, Catheterized  Result Value Ref Range Status   Specimen Description URINE, CATHETERIZED  Final   Special Requests   Final    NONE Performed at [facility], [address] [place], [place] [address]    Culture MULTIPLE SPECIES PRESENT, SUGGEST RECOLLECTION (A)  Final   Report Status 06/05/2020  FINAL  Final     Studies: No results found.  Scheduled Meds: . carbamazepine  800 mg Oral TID  . citalopram  20 mg Oral Daily  . clonazePAM  1 mg Oral Q6H  . diclofenac Sodium  4 g Topical QID  . docusate sodium  100 mg Oral BID  . feeding supplement  237 mL Oral BID BM  . hydrocortisone cream   Topical BID  . melatonin  10 mg Oral QHS  . mirtazapine  30 mg Oral QHS  . multivitamin with minerals  1 tablet Oral Daily  . naltrexone  25 mg Oral Daily  . polyethylene glycol  17 g Oral BID  . vitamin B-6  100 mg Oral Daily   . thiamine  100 mg Oral Daily  . traZODone  150 mg Oral QHS  . ziprasidone  80 mg Oral 2 times per day   Continuous Infusions:   Principal Problem:   Delirium due to multiple etiologies Active Problems:   Acute metabolic encephalopathy   Hypokalemia   Polysubstance abuse (HCC)   Elevated CK   Transaminitis   Refractory seizure (HCC)   Chronic hepatitis C without hepatic coma (HCC)   Distended abdomen   Palliative care encounter   Colonic Ileus (HCC)   Organic brain syndrome   On enteral nutrition   Physical deconditioning   Protein-calorie malnutrition (HCC)   Inadequate oral nutritional intake   Impulse disorder   Consultants:  Neurology  Psychiatry  Interventional radiology  Surgery    Procedures:  9/14 lumbar puncture  9/15 EEG  9/16 EEG  9/17 EEG  9/19 overnight EEG with video  9/22 overnight EEG with video with discontinuation of long-term EEG monitoring on 9/25  9/24 core track placement  10/6 EEG  12/15 PEG tube removed    Antibiotics: Anti-infectives (From admission, onward)   Start     Dose/Rate Route Frequency Ordered Stop   05/11/20 0845  amoxicillin-clavulanate (AUGMENTIN) 500-125 MG per tablet 500 mg        1 tablet Oral Every 8 hours 05/11/20 0750 05/18/20 0559   05/11/20 0845  metroNIDAZOLE (FLAGYL) tablet 500 mg        500 mg Oral Every 8 hours 05/11/20 0750 05/18/20 0559   04/05/20 1415  levofloxacin (LEVAQUIN) tablet 500 mg        500 mg Oral Daily 04/05/20 1327 04/11/20 0911   03/19/20 1000  metroNIDAZOLE (FLAGYL) tablet 500 mg  Status:  Discontinued        500 mg Per Tube 2 times daily 03/19/20 0822 03/23/20 0827   03/18/20 1200  amoxicillin (AMOXIL) 250 MG/5ML suspension 500 mg  Status:  Discontinued        500 mg Per Tube Every 8 hours 03/18/20 0915 03/23/20 0559   03/17/20 1200  cefTRIAXone (ROCEPHIN) 1 g in sodium chloride 0.9 % 100 mL IVPB  Status:  Discontinued        1 g 200 mL/hr over 30 Minutes Intravenous  Every 24 hours 03/17/20 1048 03/18/20 0913   03/16/20 1130  metroNIDAZOLE (FLAGYL) 50 mg/ml oral suspension 500 mg  Status:  Discontinued        500 mg Per Tube 2 times daily 03/16/20 1040 03/19/20 0821   03/16/20 1130  fluconazole (DIFLUCAN) 40 MG/ML suspension 152 mg        150 mg Per Tube  Once 03/16/20 1040 03/16/20 1245   02/11/20 1526  ceFAZolin (ANCEF) IVPB 2g/100 mL premix  2 g 200 mL/hr over 30 Minutes Intravenous  Once 02/11/20 1526 02/11/20 1641   02/05/20 0600  ceFAZolin (ANCEF) IVPB 2g/100 mL premix        2 g 200 mL/hr over 30 Minutes Intravenous To Short Stay 02/04/20 1054 02/05/20 0950   01/29/20 0600  ceFAZolin (ANCEF) IVPB 2g/100 mL premix        2 g 200 mL/hr over 30 Minutes Intravenous On call to O.R. 01/28/20 1511 01/30/20 0559   01/28/20 2000  cefTRIAXone (ROCEPHIN) 1 g in sodium chloride 0.9 % 100 mL IVPB        1 g 200 mL/hr over 30 Minutes Intravenous Every 24 hours 01/28/20 1925 02/02/20 0724   01/06/20 0900  cefTRIAXone (ROCEPHIN) 2 g in sodium chloride 0.9 % 100 mL IVPB  Status:  Discontinued        2 g 200 mL/hr over 30 Minutes Intravenous Every 12 hours 01/06/20 0105 01/06/20 2202   01/06/20 0800  vancomycin (VANCOCIN) IVPB 1000 mg/200 mL premix  Status:  Discontinued       "Followed by" Linked Group Details   1,000 mg 200 mL/hr over 60 Minutes Intravenous Every 12 hours 01/05/20 1904 01/06/20 2202   01/05/20 2000  vancomycin (VANCOREADY) IVPB 1500 mg/300 mL       "Followed by" Linked Group Details   1,500 mg 150 mL/hr over 120 Minutes Intravenous  Once 01/05/20 1904 01/06/20 0127   01/05/20 1830  cefTRIAXone (ROCEPHIN) 2 g in sodium chloride 0.9 % 100 mL IVPB        2 g 200 mL/hr over 30 Minutes Intravenous  Once 01/05/20 1829 01/05/20 2220       Time spent: 15 minutes    Albertine Grates MD PhD FACP  [facility]  Please see Amion for contact information 158 days

## 2020-06-13 NOTE — Progress Notes (Addendum)
[facility] PROGRESS NOTE  Anita Davis YPP:[id number] DOB: 07-21-1985 DOA: 01/04/2020 PCP: [name], PA-C    02/10/20                      02/15/20                       04/08/20    04/01/21  Status: Remains inpatient appropriate because:Altered mental status, Unsafe d/c plan and Inpatient level of care appropriate due to severity of illness   Dispo: The patient is from: Home              [facility]              Anticipated d/c date is: > 3 days              Patient currently is medically stable to d/c.  Barriers to discharge: Significant aggressive and violent outbursts have improved w/ no episodes sine 1/24. Remains on priority list for [facility].  Father in process of obtaining guardianship. IVC due 2/22  **1/31: Chart has been reviewed by [facility] and given patient's underlying neurological condition i.e. previous history of status epilepticus she is not an appropriate candidate for ECT.**  **2/1: [facility] staff have reevaluated this patient.  She continues to require high level inpatient psychiatric care which is not able to be provided at [facility] behavioral health facilities.  Recommendation is to continue to pursue [facility] placement. 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  Patient presented to [facility] ER in [place] several times prior to admission.  Initial presentation was related to symptoms from acute cystitis.  Two other presentations related to insomnia.  On those occasions she was able to be discharged from the ER.  She returned again on  13 September being brought in by family for altered mental status.  After admission this patient developed seizure activity which progressed to status epilepticus therefore she was transferred to [facility] for further neurological evaluation.  After several days recurrent seizures were aborted with combination of 3 antiepileptic drugs.  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  Fortunately this did not improve patient's mentation and she continued to have waxing and waning episodes of alertness and encephalopathy.  Since admission she has not had any further seizures so these medications were discontinued.  She has eventually awakened behaviors alternating between awake and appropriate, awake crying and depressed occasionally delusional, severe agitation and aggression with significant delusions.  She has been evaluated both by neurology and psychiatry who suspect a combination of sequelae from underlying brain injury as well as bipolar disorder with schizoaffective features.  During one of her alert phases patient did admit to being diagnosed with bipolar disease prior to admission.  Patient is on multiple psychotropic medications continued to have episodes of violent outbursts.  She had required frequent doses of IM Geodon as well as Recruitment consultant and frequent intervention by security staff during her episodes of severe agitation.  Recent chart review by [facility] states patient would not be  a candidate for ECT given recent brain injury sequela as well as presentation with status epilepticus.  [name] had been remaining stable regarding extreme agitation and/or violent behaviors since 1/24 when she had received her last Geodon IM dose.  On the evening of 2/4 she was also given a dose of IM Geodon.  There was no chart documentation stating what type of behavior she was experiencing but typically this  medication is only given if she has extreme agitation.  Subjective: Patient awake and sitting in the floor.  Does not appear to be agitated.  Somewhat interactive.  Reports she is bored.  Objective: Vitals:   06/12/20 1500 06/13/20 0435  BP: 116/72 99/62  Pulse: 73 (!) 51  Resp: 17 16  Temp: 97.8 F (36.6 C) 98.7 F (37.1 C)  SpO2: 97% 98%    Intake/Output Summary (Last 24 hours) at 06/13/2020 1610 Last data filed at 06/13/2020 0545 Gross per 24 hour  Intake 600 ml  Output -  Net 600 ml   Filed Weights   06/11/20 0330 06/12/20 0704 06/13/20 0431  Weight: 81.3 kg 81 kg 81.4 kg    Exam: General: Awake and alert and in no acute distress ENT: Flat macular dry patch over left improved with topical steroid Pulmonary:  Lungs clear to auscultation and stable on room air Cardiac:  Normal heart sounds, no peripheral edema, pulse regular Abdomen: Soft nontender.  Excellent appetite.  LBM 2/12 Neurological:  Cranial nerves II through XII grossly intact, no focal neurological deficits and no gait disturbance Psychiatric: Awake and alert oriented to name and place.  Again not to year.  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 During periods of alertness and lucidity patient denied use of methamphetamines prior to admission although did admit to abruptly stopping her methadone -Continue max dose Geodon 80 mg BID, naltrexone, scheduled Klonopin,  Tegretol, Celexa and Remeron at hour of sleep -Continue Tegretol to 800 mg TID as of 1/29 with improvement in reports of severe anxiety. -Continue 1:1 safety sitter  -Appreciate assistance of psych team. It appears we have maxed out on all psychotropic medications for this patient.  It is noted that many of these medications will take weeks to months for full therapeutic benefit.  Given her persistent severe behavioral swings that include violent episodes of physically attacking staff it has been determined that patient would benefit from aggressive psychiatric treatment at [facility]  --Continue IM Geodon every 8-12 hours as needed severe agitation  ?  Eczema left supraorbital region -Begin low-dose cortisone cream and follow -Patient denies itching  Severe insomnia -Continue Remeron and trazodone   History of polysubstance abuse with heroin/history of outpatient methadone treatment -Prescribed methadone 60 mg daily from a clinic in [place] prior to admission -Continue naltrexone to treat heroin addiction as well as recent issues with impulsivity and hypersexual behaviors during admission which are suspected related to brain injury and worsening psychiatric condition   Depression and severe anxiety disorder/PTSD/no bipolar disorder now with schizoaffective features -Extremes in behavior have significantly improved-continue IVC (last updated 2/8) -See above  regarding pharmacotherapy  Recurrent constipation/nausea -Continue Colace 100 mg twice daily prn -Continue MiraLAX to twice daily - has had multiple bowel movements since initiation of scheduled lactulose-lactulose could be contributing to ongoing nausea therefore will change to prn -As a precaution with her history of recurrent UTI will check urinalysis and culture to ensure nausea not secondary to UTI    Other problems: Status epilepticus -Resolved -Suspect precipitated by abrupt withdrawal of methadone as reported by  patient -Repeat MRI on 12/28 within normal limits and it is felt behavioral issues related to brain injury sequelae and chronic psychiatric condition.  Neurology has signed off -Vimpat, Dilantin and phenobarbital have been tapered and discontinued at recommendation of neurologist  Nonobstructive transaminitis in context of hepatitis C -Elevated HCV antibody with markedly elevated HCV RNA quantitative level  consistent with chronic hepatitis C  -LFTs remain stable without an obstructive pattern noted -11/10: Discussed with ID Dr. Manson Passey. Treatment of hepatitis C typically is initiated in the outpatient setting. Medications can be quite expensive and usually take time to obtain insurance approval.  -ID recommends scheduling outpatient appointment their clinic closer to discharge date  Back pain 2/2 abnormal urinalysis consistent with UTI -Patient with low-grade fever overnight and is now having back pain for days not responsive to NSAIDs and muscle relaxers -DG lumbar spine unremarkable -During hospitalization patient has been treated for pansensitive E. coli UTI as well as Salmonella UTI both of which have been sensitive to Levaquin -Completed Levaquin 12/20-urine culture obtained after antibiotics initiated and showed no growth -CT abdomen and pelvis on 12/17 unremarkable  Salmonella UTI -Completed amoxicillin  Abnormal urinalysis -Not consistent with UTI, was positive for moderate hemoglobin and greater than 50 RBCs with an elevated specific gravity -Suspect dehydration -Patient denies back pain or history of renal calculi although given her confusion unclear if this history is accurate  Physical deconditioning -Resolved -Does continue to exhibit impulsivity but maintains appropriate balance and gait no longer requires one-to-one safety sitter  Large hemorrhoids. -Resolved -Markedly improved-LD Proctofoam 12/15  Dysphagia -Resolved -Continue regular diet   Data  Reviewed: Basic Metabolic Panel: No results for input(s): NA, K, CL, CO2, GLUCOSE, BUN, CREATININE, CALCIUM, MG, PHOS in the last 168 hours. Liver Function Tests: No results for input(s): AST, ALT, ALKPHOS, BILITOT, PROT, ALBUMIN in the last 168 hours. No results for input(s): LIPASE, AMYLASE in the last 168 hours. No results for input(s): AMMONIA in the last 168 hours. CBC: No results for input(s): WBC, NEUTROABS, HGB, HCT, MCV, PLT in the last 168 hours. Cardiac Enzymes: No results for input(s): CKTOTAL, CKMB, CKMBINDEX, TROPONINI in the last 168 hours. BNP (last 3 results) No results for input(s): BNP in the last 8760 hours.  ProBNP (last 3 results) No results for input(s): PROBNP in the last 8760 hours.  CBG: No results for input(s): GLUCAP in the last 168 hours.  No results found for this or any previous visit (from the past 240 hour(s)).   Studies: No results found.  Scheduled Meds: . carbamazepine  800 mg Oral TID  . citalopram  20 mg Oral Daily  . clonazePAM  1 mg Oral Q6H  . docusate sodium  100 mg Oral BID  . feeding supplement  237 mL Oral BID BM  . hydrocortisone cream   Topical BID  . melatonin  10 mg Oral QHS  . mirtazapine  30 mg Oral QHS  . multivitamin with minerals  1 tablet Oral Daily  . naltrexone  25 mg Oral Daily  .  polyethylene glycol  17 g Oral BID  . vitamin B-6  100 mg Oral Daily  . thiamine  100 mg Oral Daily  . traZODone  150 mg Oral QHS  . ziprasidone  80 mg Oral 2 times per day   Continuous Infusions:   Principal Problem:   Delirium due to multiple etiologies Active Problems:   Acute metabolic encephalopathy   Hypokalemia   Polysubstance abuse (HCC)   Elevated CK   Transaminitis   Refractory seizure (HCC)   Chronic hepatitis C without hepatic coma (HCC)   Distended abdomen   Palliative care encounter   Colonic Ileus (HCC)   Organic brain syndrome   On enteral nutrition   Physical deconditioning   Protein-calorie malnutrition  (HCC)   Inadequate oral nutritional intake   Impulse disorder   Consultants:  Neurology  Psychiatry  Interventional radiology  Surgery    Procedures:  9/14 lumbar puncture  9/15 EEG  9/16 EEG  9/17 EEG  9/19 overnight EEG with video  9/22 overnight EEG with video with discontinuation of long-term EEG monitoring on 9/25  9/24 core track placement  10/6 EEG  12/15 PEG tube removed    Antibiotics: Anti-infectives (From admission, onward)   Start     Dose/Rate Route Frequency Ordered Stop   05/11/20 0845  amoxicillin-clavulanate (AUGMENTIN) 500-125 MG per tablet 500 mg        1 tablet Oral Every 8 hours 05/11/20 0750 05/18/20 0559   05/11/20 0845  metroNIDAZOLE (FLAGYL) tablet 500 mg        500 mg Oral Every 8 hours 05/11/20 0750 05/18/20 0559   04/05/20 1415  levofloxacin (LEVAQUIN) tablet 500 mg        500 mg Oral Daily 04/05/20 1327 04/11/20 0911   03/19/20 1000  metroNIDAZOLE (FLAGYL) tablet 500 mg  Status:  Discontinued        500 mg Per Tube 2 times daily 03/19/20 0822 03/23/20 0827   03/18/20 1200  amoxicillin (AMOXIL) 250 MG/5ML suspension 500 mg  Status:  Discontinued        500 mg Per Tube Every 8 hours 03/18/20 0915 03/23/20 0559   03/17/20 1200  cefTRIAXone (ROCEPHIN) 1 g in sodium chloride 0.9 % 100 mL IVPB  Status:  Discontinued        1 g 200 mL/hr over 30 Minutes Intravenous Every 24 hours 03/17/20 1048 03/18/20 0913   03/16/20 1130  metroNIDAZOLE (FLAGYL) 50 mg/ml oral suspension 500 mg  Status:  Discontinued        500 mg Per Tube 2 times daily 03/16/20 1040 03/19/20 0821   03/16/20 1130  fluconazole (DIFLUCAN) 40 MG/ML suspension 152 mg        150 mg Per Tube  Once 03/16/20 1040 03/16/20 1245   02/11/20 1526  ceFAZolin (ANCEF) IVPB 2g/100 mL premix        2 g 200 mL/hr over 30 Minutes Intravenous  Once 02/11/20 1526 02/11/20 1641   02/05/20 0600  ceFAZolin (ANCEF) IVPB 2g/100 mL premix        2 g 200 mL/hr over 30 Minutes Intravenous To  Short Stay 02/04/20 1054 02/05/20 0950   01/29/20 0600  ceFAZolin (ANCEF) IVPB 2g/100 mL premix        2 g 200 mL/hr over 30 Minutes Intravenous On call to O.R. 01/28/20 1511 01/30/20 0559   01/28/20 2000  cefTRIAXone (ROCEPHIN) 1 g in sodium chloride 0.9 % 100 mL IVPB        1  g 200 mL/hr over 30 Minutes Intravenous Every 24 hours 01/28/20 1925 02/02/20 0724   01/06/20 0900  cefTRIAXone (ROCEPHIN) 2 g in sodium chloride 0.9 % 100 mL IVPB  Status:  Discontinued        2 g 200 mL/hr over 30 Minutes Intravenous Every 12 hours 01/06/20 0105 01/06/20 2202   01/06/20 0800  vancomycin (VANCOCIN) IVPB 1000 mg/200 mL premix  Status:  Discontinued       "Followed by" Linked Group Details   1,000 mg 200 mL/hr over 60 Minutes Intravenous Every 12 hours 01/05/20 1904 01/06/20 2202   01/05/20 2000  vancomycin (VANCOREADY) IVPB 1500 mg/300 mL       "Followed by" Linked Group Details   1,500 mg 150 mL/hr over 120 Minutes Intravenous  Once 01/05/20 1904 01/06/20 0127   01/05/20 1830  cefTRIAXone (ROCEPHIN) 2 g in sodium chloride 0.9 % 100 mL IVPB        2 g 200 mL/hr over 30 Minutes Intravenous  Once 01/05/20 1829 01/05/20 2220       Time spent: 25 minutes    [name] ANP  [facility] 7 am - 330 pm/M-F for direct patient care and secure chat Please see Amion for contact information 159 days

## 2020-06-13 NOTE — Progress Notes (Signed)
CSW spoke with admissions at [facility] states there are no beds available today and that patient is still on the priority waiting list.  Edwin Dada, MSW, LCSW Transitions of Care  Clinical Social Worker II [phone]

## 2020-06-14 MED ORDER — ONDANSETRON HCL 4 MG PO TABS
4.0000 mg | ORAL_TABLET | Freq: Four times a day (QID) | ORAL | Status: DC | PRN
  Administered 2020-06-14 – 2020-06-20 (×8): 4 mg via ORAL
  Filled 2020-06-14 (×8): qty 1

## 2020-06-14 NOTE — Progress Notes (Signed)
[facility] and that patient is still on the priority waiting list.  CSW renewed patient's IVC - currently waiting on response from Magistrate.  Edwin Dada, MSW, LCSW Transitions of Care  Clinical Social Worker II [phone]

## 2020-06-14 NOTE — Progress Notes (Signed)
[facility] PROGRESS NOTE  Anita Davis ZOX:[id number] DOB: November 13, 1985 DOA: 01/04/2020 PCP: Jacquelin HawkingMcElroy, Shannon, PA-C    02/10/20                      02/15/20                       04/08/20    04/01/21  Status: Remains inpatient appropriate because:Altered mental status, Unsafe d/c plan and Inpatient level of care appropriate due to severity of illness   Dispo: The patient is from: Home              [facility]              Anticipated d/c date is: > 3 days              Patient currently is medically stable to d/c.  Barriers to discharge: Significant aggressive and violent outbursts have improved w/ no episodes sine 1/24. Remains on priority list for [facility].  Her father and stepmother now have guardianship of this patient and are planning to bring a copy of this documentation to the hospital. Next IVC due 3/1  **1/31: Chart has been reviewed by [facility] and given patient's underlying neurological condition i.e. previous history of status epilepticus she is not an appropriate candidate for ECT.**  **2/1: [facility] staff have reevaluated this patient.  She continues to require high level inpatient psychiatric care which is not able to be provided [facility].  Recommendation is to continue to pursue [facility] placement. 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  Patient presented to [facility] [place] several times prior to admission.  Initial presentation was related to symptoms from acute cystitis.  Two other presentations  related to insomnia.  On those occasions she was able to be discharged from the ER.  She returned again on 13 September being brought in by family for altered mental status.  After admission this patient developed seizure activity which progressed to status epilepticus therefore she was transferred [facility] for further neurological evaluation.  After several days recurrent seizures were aborted with combination of 3 antiepileptic drugs.  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  Fortunately this did not improve patient's mentation and she continued to have waxing and waning episodes of alertness and encephalopathy.  Since admission she has not had any further seizures so these medications were discontinued.  She has eventually awakened behaviors alternating between awake and appropriate, awake crying and depressed occasionally delusional, severe agitation and aggression with significant delusions.  She has been evaluated both by neurology and psychiatry who suspect a combination of sequelae from underlying brain injury as well as bipolar disorder with schizoaffective features.  During one of her alert phases patient did admit to being diagnosed with bipolar disease prior to admission.  Patient is on multiple psychotropic medications continued to have episodes of violent outbursts.  She had required frequent doses of IM Geodon as well as Recruitment consultantsafety sitter and frequent intervention  by security staff during her episodes of severe agitation.  Recent chart review by [facility] states patient would not be a candidate for ECT given recent brain injury sequela as well as presentation with status epilepticus.  [name] had been remaining stable regarding extreme agitation and/or violent behaviors since 1/24 when she had received her last Geodon IM dose.  On the evening of 2/4 she was also given a dose of IM Geodon.   There was no chart documentation stating what type of behavior she was experiencing but typically this medication is only given if she has extreme agitation.  Subjective: Patient awake and sitting up in bed.  Has pulled hair into ponytail on top of head.  Very limited eye contact with significantly flat affect.  Not interacting very well.  Has been encouraged to talk.  Objective: Vitals:   06/13/20 2307 06/14/20 0602  BP: (!) 86/48 94/61  Pulse: (!) 58 62  Resp: 16 18  Temp: 97.7 F (36.5 C) 97.9 F (36.6 C)  SpO2: 98% 98%    Intake/Output Summary (Last 24 hours) at 06/14/2020 0824 Last data filed at 06/14/2020 0433 Gross per 24 hour  Intake 120 ml  Output --  Net 120 ml   Filed Weights   06/11/20 0330 06/12/20 0704 06/13/20 0431  Weight: 81.3 kg 81 kg 81.4 kg    Exam: General: Awake, calm in no obvious distress ENT: Flat macular dry patch over left improved with topical steroid Pulmonary:  Bilateral lung sounds clear to auscultation and she is stable on room air Cardiac:  S1S2, pulses regular nontachycardic, no peripheral edema Abdomen: Soft nontender nondistended.  Eating well.  LBM 2/12 Neurological:  Cranial nerves II through XII grossly intact, no focal neurological deficits and no gait disturbance Psychiatric: Awake, very flat bland affect.  Was able to answer orientation questions of location, year and person.  When asked why she had to come to the hospital she stated "I was having seizures".  When I asked her why she was having to stay in the hospital her response was more delusional stating "because they were passing notes behind my back".   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 During periods of alertness and lucidity patient denied use of methamphetamines prior to admission although did admit to abruptly stopping her methadone -Remains relatively oriented with intermittent episodes of delusion and lack of insight into why she remains hospitalized and needs more advanced psychiatric care -Continue max dose Geodon 80 mg BID, naltrexone, scheduled Klonopin, Tegretol, Celexa and Remeron at hour of sleep -Continue Tegretol to 800 mg TID as of 1/29 with improvement in reports of severe anxiety. -Continue 1:1 safety sitter  -Appreciate assistance of psych team. It appears we have maxed out on all psychotropic medications for this patient.  It is noted that many of these medications will take weeks to months for full therapeutic benefit.  Given her persistent severe behavioral swings that include violent episodes of physically attacking staff it has been determined that patient would benefit from aggressive psychiatric treatment at [facility]  --Continue IM Geodon every 8-12 hours as needed severe agitation  ?  Eczema left supraorbital region -Begin low-dose cortisone cream and follow -Patient denies itching  Severe insomnia -Continue Remeron and trazodone   History of polysubstance abuse with heroin/history of outpatient methadone treatment -Prescribed methadone 60 mg daily from [place] in [place] prior to admission -Continue naltrexone to treat heroin addiction as well as recent issues with impulsivity and hypersexual behaviors during admission which are suspected related to brain injury and worsening psychiatric condition   Depression and severe anxiety disorder/PTSD/no bipolar disorder now with schizoaffective features -Extremes in behavior have significantly improved-continue  IVC (last updated 2/8) -See above regarding pharmacotherapy  Recurrent constipation/nausea -Continue Colace 100 mg twice daily prn -Continue MiraLAX to twice daily - has had multiple bowel movements since initiation of scheduled lactulose-lactulose could be contributing to ongoing nausea therefore will change to prn -As a precaution with her history of recurrent UTI will check urinalysis and culture to ensure nausea not secondary to UTI    Other problems: Status epilepticus -Resolved -Suspect precipitated by abrupt withdrawal of methadone as reported by patient -Repeat MRI on 12/28 within normal limits and it is felt behavioral issues related to brain injury sequelae and chronic psychiatric condition.  Neurology has signed off -Vimpat, Dilantin and phenobarbital have been tapered and discontinued at recommendation of neurologist  Nonobstructive transaminitis in context of hepatitis C -Elevated HCV antibody with markedly elevated HCV RNA quantitative level  consistent with chronic hepatitis C  -LFTs remain stable without an obstructive pattern noted -11/10: Discussed with ID Dr. Manson PasseyMandahar. Treatment of hepatitis C typically is initiated in the outpatient setting. Medications can be quite expensive and usually take time to obtain insurance approval.  -ID recommends scheduling outpatient appointment their clinic closer to discharge date  Back pain 2/2 abnormal urinalysis consistent with UTI -Patient with low-grade fever overnight and is now having back pain for days not responsive to NSAIDs and muscle relaxers -DG lumbar spine unremarkable -During hospitalization patient has been treated for pansensitive E. coli UTI as well as Salmonella UTI both of which have been sensitive to Levaquin -Completed Levaquin 12/20-urine culture obtained after antibiotics initiated and showed no growth -CT abdomen and pelvis on 12/17 unremarkable  Salmonella UTI -Completed amoxicillin  Abnormal  urinalysis -Not consistent with UTI, was positive for moderate hemoglobin and greater than 50 RBCs with an elevated specific gravity -Suspect dehydration -Patient denies back pain or history of renal calculi although given her confusion unclear if this history is accurate  Physical deconditioning -Resolved -Does continue to exhibit impulsivity but maintains appropriate balance and gait no longer requires one-to-one safety sitter  Large hemorrhoids. -Resolved -Markedly improved-LD Proctofoam 12/15  Dysphagia -Resolved -Continue regular diet   Data Reviewed: Basic Metabolic Panel: No results for input(s): NA, K, CL, CO2, GLUCOSE, BUN, CREATININE, CALCIUM, MG, PHOS in the last 168 hours. Liver Function Tests: No results for input(s): AST, ALT, ALKPHOS, BILITOT, PROT, ALBUMIN in the last 168 hours. No results for input(s): LIPASE, AMYLASE in the last 168 hours. No results for input(s): AMMONIA in the last 168 hours. CBC: No results for input(s): WBC, NEUTROABS, HGB, HCT, MCV, PLT in the last 168 hours. Cardiac Enzymes: No results for input(s): CKTOTAL, CKMB, CKMBINDEX, TROPONINI in the last 168 hours. BNP (last 3 results) No results for input(s): BNP in the last 8760 hours.  ProBNP (last 3 results) No results for input(s): PROBNP in the last 8760 hours.  CBG: No results for input(s): GLUCAP in the last 168 hours.  No results found for this or any previous  visit (from the past 240 hour(s)).   Studies: No results found.  Scheduled Meds: . carbamazepine  800 mg Oral TID  . citalopram  20 mg Oral Daily  . clonazePAM  1 mg Oral Q6H  . docusate sodium  100 mg Oral BID  . feeding supplement  237 mL Oral BID BM  . hydrocortisone cream   Topical BID  . melatonin  10 mg Oral QHS  . mirtazapine  30 mg Oral QHS  . multivitamin with minerals  1 tablet Oral Daily  . naltrexone  25 mg Oral Daily  . polyethylene glycol  17 g Oral BID  . vitamin B-6  100 mg Oral Daily  .  thiamine  100 mg Oral Daily  . traZODone  150 mg Oral QHS  . ziprasidone  80 mg Oral 2 times per day   Continuous Infusions:   Principal Problem:   Delirium due to multiple etiologies Active Problems:   Acute metabolic encephalopathy   Hypokalemia   Polysubstance abuse (HCC)   Elevated CK   Transaminitis   Refractory seizure (HCC)   Chronic hepatitis C without hepatic coma (HCC)   Distended abdomen   Palliative care encounter   Colonic Ileus (HCC)   Organic brain syndrome   On enteral nutrition   Physical deconditioning   Protein-calorie malnutrition (HCC)   Inadequate oral nutritional intake   Impulse disorder   Consultants:  Neurology  Psychiatry  Interventional radiology  Surgery    Procedures:  9/14 lumbar puncture  9/15 EEG  9/16 EEG  9/17 EEG  9/19 overnight EEG with video  9/22 overnight EEG with video with discontinuation of long-term EEG monitoring on 9/25  9/24 core track placement  10/6 EEG  12/15 PEG tube removed    Antibiotics: Anti-infectives (From admission, onward)   Start     Dose/Rate Route Frequency Ordered Stop   05/11/20 0845  amoxicillin-clavulanate (AUGMENTIN) 500-125 MG per tablet 500 mg        1 tablet Oral Every 8 hours 05/11/20 0750 05/18/20 0559   05/11/20 0845  metroNIDAZOLE (FLAGYL) tablet 500 mg        500 mg Oral Every 8 hours 05/11/20 0750 05/18/20 0559   04/05/20 1415  levofloxacin (LEVAQUIN) tablet 500 mg        500 mg Oral Daily 04/05/20 1327 04/11/20 0911   03/19/20 1000  metroNIDAZOLE (FLAGYL) tablet 500 mg  Status:  Discontinued        500 mg Per Tube 2 times daily 03/19/20 0822 03/23/20 0827   03/18/20 1200  amoxicillin (AMOXIL) 250 MG/5ML suspension 500 mg  Status:  Discontinued        500 mg Per Tube Every 8 hours 03/18/20 0915 03/23/20 0559   03/17/20 1200  cefTRIAXone (ROCEPHIN) 1 g in sodium chloride 0.9 % 100 mL IVPB  Status:  Discontinued        1 g 200 mL/hr over 30 Minutes Intravenous Every  24 hours 03/17/20 1048 03/18/20 0913   03/16/20 1130  metroNIDAZOLE (FLAGYL) 50 mg/ml oral suspension 500 mg  Status:  Discontinued        500 mg Per Tube 2 times daily 03/16/20 1040 03/19/20 0821   03/16/20 1130  fluconazole (DIFLUCAN) 40 MG/ML suspension 152 mg        150 mg Per Tube  Once 03/16/20 1040 03/16/20 1245   02/11/20 1526  ceFAZolin (ANCEF) IVPB 2g/100 mL premix        2 g 200 mL/hr over  30 Minutes Intravenous  Once 02/11/20 1526 02/11/20 1641   02/05/20 0600  ceFAZolin (ANCEF) IVPB 2g/100 mL premix        2 g 200 mL/hr over 30 Minutes Intravenous To Short Stay 02/04/20 1054 02/05/20 0950   01/29/20 0600  ceFAZolin (ANCEF) IVPB 2g/100 mL premix        2 g 200 mL/hr over 30 Minutes Intravenous On call to O.R. 01/28/20 1511 01/30/20 0559   01/28/20 2000  cefTRIAXone (ROCEPHIN) 1 g in sodium chloride 0.9 % 100 mL IVPB        1 g 200 mL/hr over 30 Minutes Intravenous Every 24 hours 01/28/20 1925 02/02/20 0724   01/06/20 0900  cefTRIAXone (ROCEPHIN) 2 g in sodium chloride 0.9 % 100 mL IVPB  Status:  Discontinued        2 g 200 mL/hr over 30 Minutes Intravenous Every 12 hours 01/06/20 0105 01/06/20 2202   01/06/20 0800  vancomycin (VANCOCIN) IVPB 1000 mg/200 mL premix  Status:  Discontinued       "Followed by" Linked Group Details   1,000 mg 200 mL/hr over 60 Minutes Intravenous Every 12 hours 01/05/20 1904 01/06/20 2202   01/05/20 2000  vancomycin (VANCOREADY) IVPB 1500 mg/300 mL       "Followed by" Linked Group Details   1,500 mg 150 mL/hr over 120 Minutes Intravenous  Once 01/05/20 1904 01/06/20 0127   01/05/20 1830  cefTRIAXone (ROCEPHIN) 2 g in sodium chloride 0.9 % 100 mL IVPB        2 g 200 mL/hr over 30 Minutes Intravenous  Once 01/05/20 1829 01/05/20 2220       Time spent: 25 minutes    [name] ANP  [facility] 7 am - 330 pm/M-F for direct patient care and secure chat Please see Amion for contact information 160 days

## 2020-06-15 NOTE — Progress Notes (Signed)
[facility] PROGRESS NOTE  Anita Davis ZOX:[id number] DOB: November 13, 1985 DOA: 01/04/2020 PCP: Anita Davis    02/10/20                      02/15/20                       04/08/20    04/01/21  Status: Remains inpatient appropriate because:Altered mental status, Unsafe d/c plan and Inpatient level of care appropriate due to severity of illness   Dispo: The patient is from: Home              [facility]              Anticipated d/c date is: > 3 days              Patient currently is medically stable to d/c.  Barriers to discharge: Significant aggressive and violent outbursts have improved w/ no episodes sine 1/24. Remains on priority list for [facility].  Her father and stepmother now have guardianship of this patient and are planning to bring a copy of this documentation to the hospital. Next IVC due 3/1  **1/31: Chart has been reviewed by [facility] and given patient's underlying neurological condition i.e. previous history of status epilepticus she is not an appropriate candidate for ECT.**  **2/1: [facility] staff have reevaluated this patient.  She continues to require high level inpatient psychiatric care which is not able to be provided [facility].  Recommendation is to continue to pursue [facility] placement. 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  Patient presented to [facility] [place] several times prior to admission.  Initial presentation was related to symptoms from acute cystitis.  Two other presentations  related to insomnia.  On those occasions she was able to be discharged from the ER.  She returned again on 13 September being brought in by family for altered mental status.  After admission this patient developed seizure activity which progressed to status epilepticus therefore she was transferred [facility] for further neurological evaluation.  After several days recurrent seizures were aborted with combination of 3 antiepileptic drugs.  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  Fortunately this did not improve patient's mentation and she continued to have waxing and waning episodes of alertness and encephalopathy.  Since admission she has not had any further seizures so these medications were discontinued.  She has eventually awakened behaviors alternating between awake and appropriate, awake crying and depressed occasionally delusional, severe agitation and aggression with significant delusions.  She has been evaluated both by neurology and psychiatry who suspect a combination of sequelae from underlying brain injury as well as bipolar disorder with schizoaffective features.  During one of her alert phases patient did admit to being diagnosed with bipolar disease prior to admission.  Patient is on multiple psychotropic medications continued to have episodes of violent outbursts.  She had required frequent doses of IM Geodon as well as Recruitment consultantsafety sitter and frequent intervention  by security staff during her episodes of severe agitation.  Recent chart review by [facility] states patient would not be a candidate for ECT given recent brain injury sequela as well as presentation with status epilepticus.  [name] had been remaining stable regarding extreme agitation and/or violent behaviors since 1/24 when she had received her last Geodon IM dose.  On the evening of 2/4 she was also given a dose of IM Geodon.   There was no chart documentation stating what type of behavior she was experiencing but typically this medication is only given if she has extreme agitation.  Subjective: Sleeping unsafety mattress on the floor with blanket no pillow.  Awakened.  Would make eye contact but would not verbally respond to any questions asked.  When offered a pillow she did accept again without any verbal acknowledgment.  Objective: Vitals:   06/14/20 2030 06/15/20 0521  BP: 105/72 108/75  Pulse: 69 64  Resp:  18  Temp: 97.9 F (36.6 C) 97.8 F (36.6 C)  SpO2: 98% 100%    Intake/Output Summary (Last 24 hours) at 06/15/2020 0816 Last data filed at 06/15/2020 0555 Gross per 24 hour  Intake 1060 ml  Output -  Net 1060 ml   Filed Weights   06/11/20 0330 06/12/20 0704 06/13/20 0431  Weight: 81.3 kg 81 kg 81.4 kg    Exam: General: Awakened, calm ENT: Flat macular dry patch over left improved with topical steroid Pulmonary:  Bilateral lung sounds clear on posterior exam, room air Cardiac:  S1S2, no tachycardia, extremities warm and dry without any edema Abdomen: Soft with normal bowel sounds.  Tolerating diet.  LBM 2/22 Neurological:  Cranial nerves II through XII grossly intact, no focal neurological deficits and no gait disturbance Psychiatric: Awakened, extremely flat affect.  When spoken to does make minimal eye contact but does not make any attempt to verbally respond or answer orientation questions.   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 During periods of alertness and lucidity patient denied use of methamphetamines prior to admission although did admit to abruptly stopping her methadone -Remains relatively oriented with intermittent episodes of delusion and lack of insight into why she remains hospitalized and needs more advanced psychiatric care -Continue max dose Geodon 80 mg BID, naltrexone, scheduled Klonopin, Tegretol, Celexa and Remeron at hour of sleep -Continue Tegretol to 800 mg TID as of 1/29 with improvement in reports of severe anxiety. -Continue 1:1 safety sitter  -Appreciate assistance of psych team. It appears we have maxed out on all psychotropic medications for this patient.  It is noted that many of these medications will take weeks to months for full therapeutic benefit.  Given her persistent severe behavioral swings that include violent episodes of physically attacking staff it has been determined that patient would benefit from aggressive psychiatric treatment at [facility]  --Continue IM Geodon every 8-12 hours as needed severe agitation -If not yet done, will place order for weekly psychiatric reevaluation  ?  Eczema left supraorbital region -Begin low-dose cortisone cream and follow -Patient denies itching  Severe insomnia -Continue Remeron and trazodone   History of polysubstance abuse with heroin/history  of outpatient methadone treatment -Prescribed methadone 60 mg daily from a clinic in [place] prior to admission -Continue naltrexone to treat heroin addiction as well as recent issues with impulsivity and hypersexual behaviors during admission which are suspected related to brain injury and worsening psychiatric condition   Depression and severe anxiety disorder/PTSD/no bipolar disorder now with schizoaffective features -Extremes in behavior have significantly improved-continue IVC (last updated 2/8) -See above regarding pharmacotherapy  Recurrent constipation/nausea -Continue Colace  100 mg twice daily prn -Continue MiraLAX to twice daily - has had multiple bowel movements since initiation of scheduled lactulose-lactulose could be contributing to ongoing nausea therefore will change to prn -As a precaution with her history of recurrent UTI will check urinalysis and culture to ensure nausea not secondary to UTI    Other problems: Status epilepticus -Resolved -Suspect precipitated by abrupt withdrawal of methadone as reported by patient -Repeat MRI on 12/28 within normal limits and it is felt behavioral issues related to brain injury sequelae and chronic psychiatric condition.  Neurology has signed off -Vimpat, Dilantin and phenobarbital have been tapered and discontinued at recommendation of neurologist  Nonobstructive transaminitis in context of hepatitis C -Elevated HCV antibody with markedly elevated HCV RNA quantitative level  consistent with chronic hepatitis C  -LFTs remain stable without an obstructive pattern noted -11/10: Discussed with ID Dr. Manson Passey. Treatment of hepatitis C typically is initiated in the outpatient setting. Medications can be quite expensive and usually take time to obtain insurance approval.  -ID recommends scheduling outpatient appointment their clinic closer to discharge date  Back pain 2/2 abnormal urinalysis consistent with UTI -Patient with low-grade fever overnight and is now having back pain for days not responsive to NSAIDs and muscle relaxers -DG lumbar spine unremarkable -During hospitalization patient has been treated for pansensitive E. coli UTI as well as Salmonella UTI both of which have been sensitive to Levaquin -Completed Levaquin 12/20-urine culture obtained after antibiotics initiated and showed no growth -CT abdomen and pelvis on 12/17 unremarkable  Salmonella UTI -Completed amoxicillin  Abnormal urinalysis -Not consistent with UTI, was positive for moderate hemoglobin and greater than 50 RBCs with an elevated  specific gravity -Suspect dehydration -Patient denies back pain or history of renal calculi although given her confusion unclear if this history is accurate  Physical deconditioning -Resolved -Does continue to exhibit impulsivity but maintains appropriate balance and gait no longer requires one-to-one safety sitter  Large hemorrhoids. -Resolved -Markedly improved-LD Proctofoam 12/15  Dysphagia -Resolved -Continue regular diet   Data Reviewed: Basic Metabolic Panel: No results for input(s): NA, K, CL, CO2, GLUCOSE, BUN, CREATININE, CALCIUM, MG, PHOS in the last 168 hours. Liver Function Tests: No results for input(s): AST, ALT, ALKPHOS, BILITOT, PROT, ALBUMIN in the last 168 hours. No results for input(s): LIPASE, AMYLASE in the last 168 hours. No results for input(s): AMMONIA in the last 168 hours. CBC: No results for input(s): WBC, NEUTROABS, HGB, HCT, MCV, PLT in the last 168 hours. Cardiac Enzymes: No results for input(s): CKTOTAL, CKMB, CKMBINDEX, TROPONINI in the last 168 hours. BNP (last 3 results) No results for input(s): BNP in the last 8760 hours.  ProBNP (last 3 results) No results for input(s): PROBNP in the last 8760 hours.  CBG: No results for input(s): GLUCAP in the last 168 hours.  No results found for this or any previous visit (from the past 240 hour(s)).   Studies: No results found.  Scheduled Meds: . carbamazepine  800 mg Oral TID  . citalopram  20 mg  Oral Daily  . clonazePAM  1 mg Oral Q6H  . docusate sodium  100 mg Oral BID  . feeding supplement  237 mL Oral BID BM  . hydrocortisone cream   Topical BID  . melatonin  10 mg Oral QHS  . mirtazapine  30 mg Oral QHS  . multivitamin with minerals  1 tablet Oral Daily  . naltrexone  25 mg Oral Daily  . polyethylene glycol  17 g Oral BID  . vitamin B-6  100 mg Oral Daily  . thiamine  100 mg Oral Daily  . traZODone  150 mg Oral QHS  . ziprasidone  80 mg Oral 2 times per day   Continuous  Infusions:   Principal Problem:   Delirium due to multiple etiologies Active Problems:   Acute metabolic encephalopathy   Hypokalemia   Polysubstance abuse (HCC)   Elevated CK   Transaminitis   Refractory seizure (HCC)   Chronic hepatitis C without hepatic coma (HCC)   Distended abdomen   Palliative care encounter   Colonic Ileus (HCC)   Organic brain syndrome   On enteral nutrition   Physical deconditioning   Protein-calorie malnutrition (HCC)   Inadequate oral nutritional intake   Impulse disorder   Consultants:  Neurology  Psychiatry  Interventional radiology  Surgery    Procedures:  9/14 lumbar puncture  9/15 EEG  9/16 EEG  9/17 EEG  9/19 overnight EEG with video  9/22 overnight EEG with video with discontinuation of long-term EEG monitoring on 9/25  9/24 core track placement  10/6 EEG  12/15 PEG tube removed    Antibiotics: Anti-infectives (From admission, onward)   Start     Dose/Rate Route Frequency Ordered Stop   05/11/20 0845  amoxicillin-clavulanate (AUGMENTIN) 500-125 MG per tablet 500 mg        1 tablet Oral Every 8 hours 05/11/20 0750 05/18/20 0559   05/11/20 0845  metroNIDAZOLE (FLAGYL) tablet 500 mg        500 mg Oral Every 8 hours 05/11/20 0750 05/18/20 0559   04/05/20 1415  levofloxacin (LEVAQUIN) tablet 500 mg        500 mg Oral Daily 04/05/20 1327 04/11/20 0911   03/19/20 1000  metroNIDAZOLE (FLAGYL) tablet 500 mg  Status:  Discontinued        500 mg Per Tube 2 times daily 03/19/20 0822 03/23/20 0827   03/18/20 1200  amoxicillin (AMOXIL) 250 MG/5ML suspension 500 mg  Status:  Discontinued        500 mg Per Tube Every 8 hours 03/18/20 0915 03/23/20 0559   03/17/20 1200  cefTRIAXone (ROCEPHIN) 1 g in sodium chloride 0.9 % 100 mL IVPB  Status:  Discontinued        1 g 200 mL/hr over 30 Minutes Intravenous Every 24 hours 03/17/20 1048 03/18/20 0913   03/16/20 1130  metroNIDAZOLE (FLAGYL) 50 mg/ml oral suspension 500 mg  Status:   Discontinued        500 mg Per Tube 2 times daily 03/16/20 1040 03/19/20 0821   03/16/20 1130  fluconazole (DIFLUCAN) 40 MG/ML suspension 152 mg        150 mg Per Tube  Once 03/16/20 1040 03/16/20 1245   02/11/20 1526  ceFAZolin (ANCEF) IVPB 2g/100 mL premix        2 g 200 mL/hr over 30 Minutes Intravenous  Once 02/11/20 1526 02/11/20 1641   02/05/20 0600  ceFAZolin (ANCEF) IVPB 2g/100 mL premix        2  g 200 mL/hr over 30 Minutes Intravenous To Short Stay 02/04/20 1054 02/05/20 0950   01/29/20 0600  ceFAZolin (ANCEF) IVPB 2g/100 mL premix        2 g 200 mL/hr over 30 Minutes Intravenous On call to O.R. 01/28/20 1511 01/30/20 0559   01/28/20 2000  cefTRIAXone (ROCEPHIN) 1 g in sodium chloride 0.9 % 100 mL IVPB        1 g 200 mL/hr over 30 Minutes Intravenous Every 24 hours 01/28/20 1925 02/02/20 0724   01/06/20 0900  cefTRIAXone (ROCEPHIN) 2 g in sodium chloride 0.9 % 100 mL IVPB  Status:  Discontinued        2 g 200 mL/hr over 30 Minutes Intravenous Every 12 hours 01/06/20 0105 01/06/20 2202   01/06/20 0800  vancomycin (VANCOCIN) IVPB 1000 mg/200 mL premix  Status:  Discontinued       "Followed by" Linked Group Details   1,000 mg 200 mL/hr over 60 Minutes Intravenous Every 12 hours 01/05/20 1904 01/06/20 2202   01/05/20 2000  vancomycin (VANCOREADY) IVPB 1500 mg/300 mL       "Followed by" Linked Group Details   1,500 mg 150 mL/hr over 120 Minutes Intravenous  Once 01/05/20 1904 01/06/20 0127   01/05/20 1830  cefTRIAXone (ROCEPHIN) 2 g in sodium chloride 0.9 % 100 mL IVPB        2 g 200 mL/hr over 30 Minutes Intravenous  Once 01/05/20 1829 01/05/20 2220       Time spent: 20 minutes    [name] ANP  [facility] 7 am - 330 pm/M-F for direct patient care and secure chat Please see Amion for contact information 161 days

## 2020-06-15 NOTE — Progress Notes (Signed)
[facility] and that patient is still on the priority waiting list.  Edwin Dada, MSW, LCSW Transitions of Care  Clinical Social Worker II [phone]

## 2020-06-16 LAB — COMPREHENSIVE METABOLIC PANEL
ALT: 103 U/L — ABNORMAL HIGH (ref 0–44)
AST: 104 U/L — ABNORMAL HIGH (ref 15–41)
Albumin: 3.3 g/dL — ABNORMAL LOW (ref 3.5–5.0)
Alkaline Phosphatase: 98 U/L (ref 38–126)
Anion gap: 9 (ref 5–15)
BUN: 13 mg/dL (ref 6–20)
CO2: 24 mmol/L (ref 22–32)
Calcium: 8.6 mg/dL — ABNORMAL LOW (ref 8.9–10.3)
Chloride: 100 mmol/L (ref 98–111)
Creatinine, Ser: 0.69 mg/dL (ref 0.44–1.00)
GFR, Estimated: >60 mL/min (ref >60)
Glucose, Bld: 130 mg/dL — ABNORMAL HIGH (ref 70–99)
Potassium: 3.9 mmol/L (ref 3.5–5.1)
Sodium: 133 mmol/L — ABNORMAL LOW (ref 135–145)
Total Bilirubin: 0.5 mg/dL (ref 0.3–1.2)
Total Protein: 5.8 g/dL — ABNORMAL LOW (ref 6.5–8.1)

## 2020-06-16 LAB — TSH: TSH: 3.084 u[IU]/mL (ref 0.350–4.500)

## 2020-06-16 LAB — CBC
HCT: 36.6 % (ref 36.0–46.0)
Hemoglobin: 12.3 g/dL (ref 12.0–15.0)
MCH: 31.3 pg (ref 26.0–34.0)
MCHC: 33.6 g/dL (ref 30.0–36.0)
MCV: 93.1 fL (ref 80.0–100.0)
Platelets: 246 10*3/uL (ref 150–400)
RBC: 3.93 MIL/uL (ref 3.87–5.11)
RDW: 12 % (ref 11.5–15.5)
WBC: 3.5 10*3/uL — ABNORMAL LOW (ref 4.0–10.5)
nRBC: 0 % (ref 0.0–0.2)

## 2020-06-16 LAB — PROTIME-INR
INR: 1 (ref 0.8–1.2)
Prothrombin Time: 12.9 seconds (ref 11.4–15.2)

## 2020-06-16 LAB — CARBAMAZEPINE LEVEL, TOTAL: Carbamazepine Lvl: 13.4 ug/mL — ABNORMAL HIGH (ref 4.0–12.0)

## 2020-06-16 MED ORDER — CITALOPRAM HYDROBROMIDE 40 MG PO TABS
40.0000 mg | ORAL_TABLET | Freq: Every day | ORAL | Status: DC
  Administered 2020-06-17 – 2020-07-02 (×16): 40 mg via ORAL
  Filled 2020-06-16 (×16): qty 1

## 2020-06-16 MED ORDER — CARBAMAZEPINE 200 MG PO TABS
400.0000 mg | ORAL_TABLET | Freq: Three times a day (TID) | ORAL | Status: DC
  Administered 2020-06-16 – 2020-06-22 (×20): 400 mg via ORAL
  Filled 2020-06-16 (×22): qty 2

## 2020-06-16 NOTE — Progress Notes (Signed)
[facility] PROGRESS NOTE  Anita Davis UJW:[id number] DOB: Jan 30, 1986 DOA: 01/04/2020 PCP: Anita Hawking, PA-C    02/10/20                      02/15/20                       04/08/20    04/01/21  Status: Remains inpatient appropriate because:Altered mental status, Unsafe d/c plan and Inpatient level of care appropriate due to severity of illness   Dispo: The patient is from: Home              [facility]              Anticipated d/c date is: > 3 days              Patient currently is medically stable to d/c.  Barriers to discharge: Significant aggressive and violent outbursts have improved w/ no episodes sine 1/24. Remains on priority list for [facility].  Her father and stepmother now have guardianship of this patient and are planning to bring a copy of this documentation to the hospital. Next IVC due 3/1  On 2/24 have asked psych to reevaluate this patient to determine if she still requires placement at [facility] or if she would be appropriate to discharge to the home environment with appropriate psychiatric follow-up.  Of note she still does not have Medicaid so there may be some difficulty in obtaining medications.  In the interim it is determined that patient can discharge to home we have reached out to [facility] to determine if she would be an appropriate patient for their clinic.  She definitely needs to be assigned a psychiatrist for medication management and likely needs to be enrolled in some sort of day program.     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  Patient presented to [facility] ER in [place] several times prior to admission.  Initial presentation was related to symptoms from acute cystitis.  Two other presentations related to insomnia.  On those occasions she was able to be discharged from the ER.  She returned again on 13 September being brought in by family for altered mental status.  After admission this patient developed seizure activity which progressed to status epilepticus therefore she was transferred to [facility] for further neurological evaluation.  After several days recurrent seizures were aborted with combination of 3 antiepileptic drugs.  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  Fortunately this did not improve patient's mentation and she continued to have waxing and waning episodes of alertness and encephalopathy.  Since admission she has not had any further seizures so these medications were discontinued.  She has eventually awakened behaviors alternating between awake and appropriate, awake crying and depressed occasionally delusional, severe agitation and aggression with significant delusions.  She has been evaluated both by neurology and psychiatry who suspect a combination of sequelae from underlying brain injury as well as bipolar disorder with schizoaffective features.  During one of her alert phases  patient did admit to being diagnosed with bipolar disease prior to admission.  Patient is on multiple psychotropic medications continued to have episodes of violent outbursts.  She had required frequent doses of IM Geodon as well as Recruitment consultantsafety sitter and frequent intervention by security staff during her episodes of severe agitation.  [facility] states patient would not be a candidate for ECT given recent brain injury sequela as well as presentation with status epilepticus.  Anita Davis**Anita Davis  had been remaining stable regarding extreme agitation and/or violent behaviors since 1/24 when she had received her last Geodon IM dose.  On the evening of 2/4 she was also given a dose of IM Geodon.  There was no chart documentation stating what type of behavior she was experiencing but typically this medication is only given if she has extreme agitation.  Subjective: Awake and laying on safety mats on the floor again today.  Essentially would not speak to me despite multiple attempts to engage patient.  Eventually I asked the patient why she was so quiet and she stated "because I am going to prison today".  Reassured patient that she was not going to prison although we were attempting [facility].  Did explain to patient that she may eventually be able to discharge home with her dad and stepmother but that nonnegotiable requirement would be that she no longer socialize with her friends from prior to admission noting that these friends have addiction problems.  Explained to patient that if she began using again in context of these medications and quit taking these medications she would end up back in the hospital and if she were to have any unusual side effects they could be lethal.  Unclear if she understands the context of the conversation but she stated that she knew she could not "hang out with my old friends" any further.  Objective: Vitals:   06/15/20 2055 06/16/20 0512  BP: 123/74 (!) 93/59  Pulse: 60 61  Resp: 18 17  Temp: 98.3 F (36.8 C) 97.8 F (36.6 C)  SpO2: 98% 98%    Intake/Output Summary (Last 24 hours) at 06/16/2020 0800 Last data filed at 06/16/2020 0730 Gross per 24 hour  Intake 1260 ml  Output 1 ml  Net 1259 ml   Filed Weights   06/11/20 0330 06/12/20 0704 06/13/20 0431  Weight: 81.3 kg 81 kg 81.4 kg    Exam: General: Awake, essentially nonconversant until probed, no acute distress Pulmonary:  Bilateral lung sounds  clear to auscultation and is stable on room air Cardiac:  S1S2, pulse regular, normotensive, extremities warm to touch without peripheral edema Abdomen: Soft normoactive bowel sounds.  Eating well.  LBM 2/22 Neurological:  Cranial nerves 2-12 intact, no focal neurological deficits and ambulates without difficulty Psychiatric: Awakened.  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 During periods of alertness and lucidity patient denied use of methamphetamines prior to admission although did admit to abruptly stopping her  methadone -Remains intermittently oriented with episodes of delusion and lack of insight into her current psychiatric status -Continue max dose Geodon 80 mg BID, naltrexone, scheduled Klonopin, Tegretol, Celexa and Remeron at hour of sleep -LFTs trending up so will decrease Tegretol to 400 mg TID -repeat LFTs in 2-3 weeks -Continue 1:1 safety sitter  -Appreciate assistance of psych team.  Patient will be reevaluated today on 2/24 to determine if she continues to require placement at [facility] or she could safely transition to the home environment --Continue IM Geodon every 8-12 hours as needed severe agitation  ?  Eczema left supraorbital region -Begin low-dose cortisone cream and follow -Patient denies itching  Severe insomnia -Continue Remeron and trazodone   History of  polysubstance abuse with heroin/history of outpatient methadone treatment -Prescribed methadone 60 mg daily from a clinic in [place] prior to admission -Continue naltrexone to treat heroin addiction as well as recent issues with impulsivity and hypersexual behaviors during admission which are suspected related to brain injury and worsening psychiatric condition   Depression and severe anxiety disorder/PTSD/no bipolar disorder now with schizoaffective features -Extremes in behavior have significantly improved-continue IVC (last updated 2/8) -See above regarding pharmacotherapy  Recurrent constipation/nausea -Continue Colace 100 mg twice daily prn -Continue MiraLAX to twice daily - has had multiple bowel movements since initiation of scheduled lactulose-lactulose could be contributing to ongoing nausea therefore will change to prn -As a precaution with her history of recurrent UTI will check urinalysis and culture to ensure nausea not secondary to UTI    Other problems: Status epilepticus -Resolved -Suspect precipitated by abrupt withdrawal of methadone as reported by patient -Repeat MRI on 12/28 within normal limits and it is felt behavioral issues related to brain injury sequelae and chronic psychiatric condition.  Neurology has signed off -Vimpat, Dilantin and phenobarbital have been tapered and discontinued at recommendation of neurologist  Nonobstructive transaminitis in context of hepatitis C -Elevated HCV antibody with markedly elevated HCV RNA quantitative level  consistent with chronic hepatitis C  -LFTs remain stable without an obstructive pattern noted -11/10: Discussed with ID Dr. Manson Passey. Treatment of hepatitis C typically is initiated in the outpatient setting. Medications can be quite expensive and usually take time to obtain insurance approval.  -ID recommends scheduling outpatient appointment their clinic closer to discharge date  Back pain 2/2 abnormal urinalysis  consistent with UTI -Patient with low-grade fever overnight and is now having back pain for days not responsive to NSAIDs and muscle relaxers -DG lumbar spine unremarkable -During hospitalization patient has been treated for pansensitive E. coli UTI as well as Salmonella UTI both of which have been sensitive to Levaquin -Completed Levaquin 12/20-urine culture obtained after antibiotics initiated and showed no growth -CT abdomen and pelvis on 12/17 unremarkable  Salmonella UTI -Completed amoxicillin  Abnormal urinalysis -Not consistent with UTI, was positive for moderate hemoglobin and greater than 50 RBCs with an elevated specific gravity -Suspect dehydration -Patient denies back pain or history of renal calculi although given her confusion unclear if this history is accurate  Physical deconditioning -Resolved -Does continue to exhibit impulsivity but maintains appropriate balance and gait no longer requires one-to-one safety sitter  Large hemorrhoids. -Resolved -Markedly improved-LD Proctofoam 12/15  Dysphagia -Resolved -Continue regular diet   Data Reviewed: Basic Metabolic Panel: Recent Labs  Lab 06/16/20 0106  NA 133*  K 3.9  CL 100  CO2 24  GLUCOSE 130*  BUN 13  CREATININE 0.69  CALCIUM 8.6*   Liver Function Tests: Recent Labs  Lab 06/16/20 0106  AST 104*  ALT 103*  ALKPHOS 98  BILITOT 0.5  PROT 5.8*  ALBUMIN 3.3*   No results for input(s): LIPASE, AMYLASE in the last 168 hours. No results for input(s): AMMONIA in the last 168 hours. CBC: Recent Labs  Lab 06/16/20 0106  WBC 3.5*  HGB 12.3  HCT 36.6  MCV 93.1  PLT 246   Cardiac Enzymes: No results for input(s): CKTOTAL, CKMB, CKMBINDEX, TROPONINI in the last 168 hours. BNP (last 3 results) No results for input(s): BNP in the last 8760 hours.  ProBNP (last 3 results) No results for input(s): PROBNP in the last 8760 hours.  CBG: No results for input(s): GLUCAP in the last 168 hours.  No  results found for this or any previous visit (from the past 240 hour(s)).   Studies: No results found.  Scheduled Meds: . carbamazepine  400 mg Oral TID  . citalopram  20 mg Oral Daily  . clonazePAM  1 mg Oral Q6H  . docusate sodium  100 mg Oral BID  . feeding supplement  237 mL Oral BID BM  . hydrocortisone cream   Topical BID  . melatonin  10 mg Oral QHS  . mirtazapine  30 mg Oral QHS  . multivitamin with minerals  1 tablet Oral Daily  . naltrexone  25 mg Oral Daily  . polyethylene glycol  17 g Oral BID  . vitamin B-6  100 mg Oral Daily  . thiamine  100 mg Oral Daily  . traZODone  150 mg Oral QHS  . ziprasidone  80 mg Oral 2 times per day   Continuous Infusions:   Principal Problem:   Delirium due to multiple etiologies Active Problems:   Acute metabolic encephalopathy   Hypokalemia   Polysubstance abuse (HCC)   Elevated CK   Transaminitis   Refractory seizure (HCC)   Chronic hepatitis C without hepatic coma (HCC)   Distended abdomen   Palliative care encounter   Colonic Ileus (HCC)   Organic brain syndrome   On enteral nutrition   Physical deconditioning   Protein-calorie malnutrition (HCC)   Inadequate oral nutritional intake   Impulse disorder   Consultants:  Neurology  Psychiatry  Interventional radiology  Surgery    Procedures:  9/14 lumbar puncture  9/15 EEG  9/16 EEG  9/17 EEG  9/19 overnight EEG with video  9/22 overnight EEG with video with discontinuation of long-term EEG monitoring on 9/25  9/24 core track placement  10/6 EEG  12/15 PEG tube removed    Antibiotics: Anti-infectives (From admission, onward)   Start     Dose/Rate Route Frequency Ordered Stop   05/11/20 0845  amoxicillin-clavulanate (AUGMENTIN) 500-125 MG per tablet 500 mg        1 tablet Oral Every 8 hours 05/11/20 0750 05/18/20 0559   05/11/20 0845  metroNIDAZOLE (FLAGYL) tablet 500 mg        500 mg Oral Every 8 hours 05/11/20 0750 05/18/20 0559    04/05/20 1415  levofloxacin (LEVAQUIN) tablet 500 mg        500 mg Oral Daily 04/05/20 1327 04/11/20 0911   03/19/20 1000  metroNIDAZOLE (FLAGYL) tablet 500 mg  Status:  Discontinued        500 mg Per Tube 2 times daily 03/19/20 0822 03/23/20 0827   03/18/20 1200  amoxicillin (AMOXIL) 250 MG/5ML suspension 500 mg  Status:  Discontinued        500 mg Per Tube Every 8 hours 03/18/20  0915 03/23/20 0559   03/17/20 1200  cefTRIAXone (ROCEPHIN) 1 g in sodium chloride 0.9 % 100 mL IVPB  Status:  Discontinued        1 g 200 mL/hr over 30 Minutes Intravenous Every 24 hours 03/17/20 1048 03/18/20 0913   03/16/20 1130  metroNIDAZOLE (FLAGYL) 50 mg/ml oral suspension 500 mg  Status:  Discontinued        500 mg Per Tube 2 times daily 03/16/20 1040 03/19/20 0821   03/16/20 1130  fluconazole (DIFLUCAN) 40 MG/ML suspension 152 mg        150 mg Per Tube  Once 03/16/20 1040 03/16/20 1245   02/11/20 1526  ceFAZolin (ANCEF) IVPB 2g/100 mL premix        2 g 200 mL/hr over 30 Minutes Intravenous  Once 02/11/20 1526 02/11/20 1641   02/05/20 0600  ceFAZolin (ANCEF) IVPB 2g/100 mL premix        2 g 200 mL/hr over 30 Minutes Intravenous To Short Stay 02/04/20 1054 02/05/20 0950   01/29/20 0600  ceFAZolin (ANCEF) IVPB 2g/100 mL premix        2 g 200 mL/hr over 30 Minutes Intravenous On call to O.R. 01/28/20 1511 01/30/20 0559   01/28/20 2000  cefTRIAXone (ROCEPHIN) 1 g in sodium chloride 0.9 % 100 mL IVPB        1 g 200 mL/hr over 30 Minutes Intravenous Every 24 hours 01/28/20 1925 02/02/20 0724   01/06/20 0900  cefTRIAXone (ROCEPHIN) 2 g in sodium chloride 0.9 % 100 mL IVPB  Status:  Discontinued        2 g 200 mL/hr over 30 Minutes Intravenous Every 12 hours 01/06/20 0105 01/06/20 2202   01/06/20 0800  vancomycin (VANCOCIN) IVPB 1000 mg/200 mL premix  Status:  Discontinued       "Followed by" Linked Group Details   1,000 mg 200 mL/hr over 60 Minutes Intravenous Every 12 hours 01/05/20 1904 01/06/20 2202    01/05/20 2000  vancomycin (VANCOREADY) IVPB 1500 mg/300 mL       "Followed by" Linked Group Details   1,500 mg 150 mL/hr over 120 Minutes Intravenous  Once 01/05/20 1904 01/06/20 0127   01/05/20 1830  cefTRIAXone (ROCEPHIN) 2 g in sodium chloride 0.9 % 100 mL IVPB        2 g 200 mL/hr over 30 Minutes Intravenous  Once 01/05/20 1829 01/05/20 2220       Time spent: 20 minutes    [name] ANP  [facility] 7 am - 330 pm/M-F for direct patient care and secure chat Please see Amion for contact information 162 days

## 2020-06-16 NOTE — Consult Note (Signed)
[facility] Face-to-Face Psychiatry Consult   Reason for Consult: Reassessment for ongoing psychiatric inpatient Referring Physician:  Dr. Lanae Boastamesh KC Patient Identification: Anita Davis MRN:  [id number] Principal Diagnosis: Delirium due to multiple etiologies Diagnosis:  Principal Problem:   Delirium due to multiple etiologies Active Problems:   Acute metabolic encephalopathy   Hypokalemia   Polysubstance abuse (HCC)   Elevated CK   Transaminitis   Refractory seizure (HCC)   Chronic hepatitis C without hepatic coma (HCC)   Distended abdomen   Palliative care encounter   Colonic Ileus (HCC)   Organic brain syndrome   On enteral nutrition   Physical deconditioning   Protein-calorie malnutrition (HCC)   Inadequate oral nutritional intake   Impulse disorder   Total Time spent with patient: 45 minutes  Subjective:   Anita Davis is a 35 y.o. female patient with a history of polysubstance use disorder, who presented to [facility] in Sep with encephalopathy. EEG showed status epilepticus arising from the left central region. She was treated with Keppra, Vimpat and Dilantin and her mental status reportedly improved. She was transferred to [facility] for further work up.  Patient presents with improved behaviors as measured by her decrease in rapid cycling, agitation, aggression, and unpredictable behaviors. On evaluation today she is observed to be lying on a mat in the floor. She was alert and oriented, calm yet grudgingly uncooperative. She presented as guarded, irritable and not willing to participate in the reassessment. She denies any depressive symptoms or anxiety at this time. She does not present as manic, delusional, paranoid, or psychotic. She does not appear to be responding to internal/external stimuli or preoccupied. She denies any suicidal thoughts, homicidal thoughts and or hallucinations.   HP/synopsis: Please see full note from Junious SilkAllison Ellis nurse practitioner, and has  also been following this patient throughout her duration of stay of 115 days.  Since admission patient has had varying levels of awakeness and orientation as well as labile mood and affect.  Essentially for the first 8 weeks of hospitalization patient would have brief episodes of being awake, sometimes oriented sometimes not.  These episodes would last for several minutes to several hours but were never sustained.  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  Multiple psychotropic medications were tried without success or caused significant side effects and were discontinued.  Decision made to taper and wean all psychotropic drugs.  After this was done patient became extremely alert and agitated.  She was very impulsive and was demonstrating hypersexual behavior as well as extreme mood swings.  Seroquel tried initially to manage brain injury sequela but required high dosages unfortunately contributing to patient's depression and suicidal ideation.  It was noted that each time patient was given IM Geodon that she became alert and for the most part oriented, was able to consistently communicate and perform ADLs.  Eventually she was transitioned to oral Geodon.  Naltrexone had been started to assist with underlying hypersexual behaviors and impulsivity and to treat her underlying heroin addiction since she had not had any methadone or heroin since admission.  She has also been placed on Klonopin and clonidine to aid with her severe anxiety.  Recently patient has had issues with extreme agitation around the holidays and was acting out with wandering behaviors, spitting on staff, using profanity in the hallways and fighting with security staff when restraints were applied.  Some of her behaviors have been manipulative especially in regards to attempting  to obtain drugs such as benzodiazepines or narcotics.  Neurology was reconsulted.  Repeat MRI unremarkable and it is felt  current behaviors are combination of brain injury sequela as well as underlying psychiatric issues from chronic drug abuse.  Recently patient has become focused on obtaining methadone.  Typically when she becomes anxious or restless she request some sort of drug: " I need a hot shot" (Ambien), " I need something for my anxiety I cannot take it anymore"; and most recently she is focused on obtaining methadone.  She remains confused and alternates between sad and depressed or angry and defiant.  The patient was observed to be in the bed in a deep sleep.  She continues to have a one-on-one sitter for her safety, as she continues to have periods of alertness, followed by some catatonia, followed by aggression and combativeness.  As noted in chart patient has had multiple failed attempts at multiple psychotropic medication to include first and second generation antipsychotic, multiple mood stabilizers, and several trials of benzodiazepines.  There have been multiple attempts to wean her off of the benzodiazepines, however patient has notable agitation and aggression and is a concern for withdrawal syndrome.  At the time of this evaluation patient is currently taking it carbamazepine 600 mg p.o. 3 times daily, clonazepam 1.5 mg p.o. every 6 hours, Escitalopram 20 mg p.o. nightly, mirtazapine 30 mg p.o. nightly, and ziprasidone 80 mg p.o. twice daily.   Past Psychiatric History: Please see initial evaluation for full details. I have reviewed the history. No updates at this time.   Risk to Self:  yes Risk to Others:  yes Prior Inpatient Therapy:  see initial eval. No update Prior Outpatient Therapy:  see initial eval. No update  Past Medical History:  Past Medical History:  Diagnosis Date  . Allergy   . Bronchitis   . Hepatitis-C   . History of gestational diabetes   . HPV in female   . Substance abuse [facility])     Past Surgical History:  Procedure Laterality Date  . HEMORRHOID BANDING  age 74  . IR  GASTROSTOMY TUBE REMOVAL  04/06/2020  . IR REPLC GASTRO/COLONIC TUBE PERCUT W/FLUORO  02/11/2020  . LAPAROSCOPIC GASTROSTOMY N/A 02/05/2020   Procedure: LAPAROSCOPIC GASTROSTOMY PLACEMENT;  Surgeon: Gaynelle Adu, MD;  Location: [facility] OR;  Service: General;  Laterality: N/A;   Family History:  Family History  Problem Relation Age of Onset  . Depression Mother   . Cervical cancer Mother   . Hypertension Father   . Diabetes Father    Family Psychiatric  History: Please see initial evaluation for full details. I have reviewed the history. No updates at this time.    Social History:  Social History   Substance and Sexual Activity  Alcohol Use No     Social History   Substance and Sexual Activity  Drug Use Not Currently  . Types: IV, Cocaine, Hydrocodone, Oxycodone, Marijuana, Heroin   Comment: takes methadone- now taking subutex (03/27/19)    Social History   Socioeconomic History  . Marital status: Single    Spouse name: Not on file  . Number of children: Not on file  . Years of education: Not on file  . Highest education level: Not on file  Occupational History  . Not on file  Tobacco Use  . Smoking status: Current Every Day Smoker    Years: 24.00    Types: Cigarettes, Cigars  . Smokeless tobacco: Never Used  . Tobacco comment: 1  cigar/daily. former 1 pack cigarette smoker quit 05/2019  currently smokes CBD  Vaping Use  . Vaping Use: Every day  . Substances: Nicotine, Flavoring  Substance and Sexual Activity  . Alcohol use: No  . Drug use: Not Currently    Types: IV, Cocaine, Hydrocodone, Oxycodone, Marijuana, Heroin    Comment: takes methadone- now taking subutex (03/27/19)  . Sexual activity: Not Currently    Birth control/protection: Abstinence  Other Topics Concern  . Not on file  Social History Narrative  . Not on file   Social Determinants of Health   Financial Resource Strain: Not on file  Food Insecurity: Not on file  Transportation Needs: Not on file   Physical Activity: Not on file  Stress: Not on file  Social Connections: Not on file   Additional Social History:    Allergies:  No Known Allergies  Labs:  Results for orders placed or performed during the hospital encounter of 01/04/20 (from the past 48 hour(s))  CBC     Status: Abnormal   Collection Time: 06/16/20  1:06 AM  Result Value Ref Range   WBC 3.5 (L) 4.0 - 10.5 K/uL   RBC 3.93 3.87 - 5.11 MIL/uL   Hemoglobin 12.3 12.0 - 15.0 g/dL   HCT 43.3 29.5 - 18.8 %   MCV 93.1 80.0 - 100.0 fL   MCH 31.3 26.0 - 34.0 pg   MCHC 33.6 30.0 - 36.0 g/dL   RDW 41.6 60.6 - 30.1 %   Platelets 246 150 - 400 K/uL   nRBC 0.0 0.0 - 0.2 %    Comment: Performed at [facility], [address] [place], [place] [address]  Comprehensive metabolic panel     Status: Abnormal   Collection Time: 06/16/20  1:06 AM  Result Value Ref Range   Sodium 133 (L) 135 - 145 mmol/L   Potassium 3.9 3.5 - 5.1 mmol/L   Chloride 100 98 - 111 mmol/L   CO2 24 22 - 32 mmol/L   Glucose, Bld 130 (H) 70 - 99 mg/dL    Comment: Glucose reference range applies only to samples taken after fasting for at least 8 hours.   BUN 13 6 - 20 mg/dL   Creatinine, Ser 3.23 0.44 - 1.00 mg/dL   Calcium 8.6 (L) 8.9 - 10.3 mg/dL   Total Protein 5.8 (L) 6.5 - 8.1 g/dL   Albumin 3.3 (L) 3.5 - 5.0 g/dL   AST 557 (H) 15 - 41 U/L   ALT 103 (H) 0 - 44 U/L   Alkaline Phosphatase 98 38 - 126 U/L   Total Bilirubin 0.5 0.3 - 1.2 mg/dL   GFR, Estimated >32 >20 mL/min    Comment: (NOTE) Calculated using the CKD-EPI Creatinine Equation (2021)    Anion gap 9 5 - 15    Comment: Performed at [facility], [address], [place], [place] [address]  Protime-INR     Status: None   Collection Time: 06/16/20  1:06 AM  Result Value Ref Range   Prothrombin Time 12.9 11.4 - 15.2 seconds   INR 1.0 0.8 - 1.2    Comment: (NOTE) INR goal varies based on device and disease states. Performed at [facility], [address],  [place], [place] [address]   TSH     Status: None   Collection Time: 06/16/20  1:06 AM  Result Value Ref Range   TSH 3.084 0.350 - 4.500 uIU/mL    Comment: Performed by  a 3rd Generation assay with a functional sensitivity of <=0.01 uIU/mL. Performed at [facility], [address], [place], [place] [address]     Current Facility-Administered Medications  Medication Dose Route Frequency Provider Last Rate Last Admin  . acetaminophen (TYLENOL) tablet 650 mg  650 mg Oral Q6H PRN Russella Dar, NP   650 mg at 06/08/20 0815  . benzocaine (ORAJEL) 10 % mucosal gel   Mouth/Throat QID PRN Marikay Alar, FNP   Given at 05/13/20 0016  . carbamazepine (TEGRETOL) tablet 400 mg  400 mg Oral TID Russella Dar, NP   400 mg at 06/16/20 1552  . [START ON 06/17/2020] citalopram (CELEXA) tablet 40 mg  40 mg Oral Daily Maryagnes Amos, FNP      . clonazePAM (KLONOPIN) tablet 1 mg  1 mg Oral Q6H Russella Dar, NP   1 mg at 06/16/20 1742  . diclofenac Sodium (VOLTAREN) 1 % topical gel 4 g  4 g Topical QID PRN Albertine Grates, MD      . docusate sodium (COLACE) capsule 100 mg  100 mg Oral BID Russella Dar, NP   100 mg at 06/16/20 9562  . feeding supplement (ENSURE ENLIVE / ENSURE PLUS) liquid 237 mL  237 mL Oral BID BM Pahwani, Ravi, MD   237 mL at 06/16/20 1400  . hydrocortisone cream 1 %   Topical BID Russella Dar, NP   Given at 06/11/20 2103  . lactulose (CHRONULAC) 10 GM/15ML solution 10 g  10 g Oral Daily PRN Russella Dar, NP      . loratadine (CLARITIN) tablet 10 mg  10 mg Oral Daily PRN Omelia Blackwater K, NP   10 mg at 05/11/20 0500  . melatonin tablet 10 mg  10 mg Oral QHS Maryagnes Amos, FNP   10 mg at 06/15/20 2102  . mirtazapine (REMERON) tablet 30 mg  30 mg Oral QHS Russella Dar, NP   30 mg at 06/15/20 2102  . multivitamin with minerals tablet 1 tablet  1 tablet Oral Daily Russella Dar, NP   1 tablet at 06/16/20 1308  . naltrexone (DEPADE) tablet 25 mg  25 mg Oral  Daily Russella Dar, NP   25 mg at 06/16/20 0836  . ondansetron (ZOFRAN) tablet 4 mg  4 mg Oral Q6H PRN Russella Dar, NP   4 mg at 06/16/20 0931  . polyethylene glycol (MIRALAX / GLYCOLAX) packet 17 g  17 g Oral BID Russella Dar, NP   17 g at 06/15/20 2103  . pyridOXINE (VITAMIN B-6) tablet 100 mg  100 mg Oral Daily Russella Dar, NP   100 mg at 06/16/20 0837  . sodium chloride (OCEAN) 0.65 % nasal spray 1 spray  1 spray Each Nare PRN Omelia Blackwater K, NP      . thiamine tablet 100 mg  100 mg Oral Daily Russella Dar, NP   100 mg at 06/16/20 6578  . traZODone (DESYREL) tablet 150 mg  150 mg Oral QHS Russella Dar, NP   150 mg at 06/15/20 2102  . ziprasidone (GEODON) capsule 80 mg  80 mg Oral 2 times per day Marguerita Merles Latif, DO   80 mg at 06/16/20 4696  . ziprasidone (GEODON) injection 20 mg  20 mg Intramuscular Q8H PRN Russella Dar, NP   20 mg at 05/27/20 2217    Musculoskeletal: Strength & Muscle Tone: N/A Gait & Station: normal Patient  leans: N/A  Psychiatric Specialty Exam: Physical Exam Vitals and nursing note reviewed.     Review of Systems  Unable to perform ROS: Other    Blood pressure 112/70, pulse 61, temperature (!) 97.4 F (36.3 C), temperature source Oral, resp. rate 18, height 5\' 5"  (1.651 m), weight 81.4 kg, SpO2 98 %.Body mass index is 29.87 kg/m.  General Appearance: Guarded  Eye Contact:  Minimal  Speech:  Clear and Coherent and Normal Rate  Volume:  Normal,  Mood:  Irritable  Affect:  Blunt and Congruent  Thought Process:  Coherent, Linear and Descriptions of Associations: Intact,   Orientation:  Full (Time, Place, and Person)  Thought Content:  Logical  Suicidal Thoughts:  No  Homicidal Thoughts:  No  Memory:  Immediate;   Fair Recent;   Fair Remote;   Fair  Judgement:  Intact  Insight:  Shallow  Psychomotor Activity:  Normal  Concentration:  limited  Recall:  of Knowledge:  Fair  Language:  Fair  Akathisia:  No   Handed:  Right  AIMS (if indicated):     Assets:  Desire for Improvement Resilience Social Support  ADL's:  Complete  Cognition: WNL  Sleep:       Assessment Anita Davis is a 35 y.o. year old female with a history of polysubstance use disorder, who presented to [facility] hospital in Sep 2021 with encephalopathy. EEG showed status epilepticus arising from the left central region. She was treated with Keppra, Vimpat and Dilantin and her mental status reportedly improved. She was transferred to [facility] for further work up. Per chart review, her mental status has improved, in addition to her moods. She has not received any prn medications in over 2 weeks.  #Bipolar affective psychosis  # PTSD # Substance-induced mood disorder  Patient presents with improved behaviors and mental status. She remains on 1:1 at this time, due to unpredictable behaviors that she is no longer exhibiting. SHe last received Geodon IM on 05/27/2020. The need for prn medications has been reduced, and she appears to have stabilized. While she may exhibit emotional dysregulation and unpredictable behaviors, her level of aggression has decreased. There continues to be concerns about safe disposition if she were to return home. Patient continues to require higher level of care to ensure her overall safety with goals to restore and recover her previous mental state.   Treatment Plan Summary: Plan as below - Continue IVC due to high risk of self harm and others.  -Continue safety precaution and sitter for unpredicted behavior - Continue Geodon 80 mg BID  -Continue Geodon 20 mg IM twice daily as needed for agitation, aggression, and psychosis. - Monitor EKG to rule out QTc prolongation. Would advise discontinuation of Geodon if QTc>500 msec. Will obtain EKG -Will continue Tegretol 400mg  po TID, recently reduced due to elevated liver enzymes. Will obtain tegretol levels -->goal 12. .  -Will increase citalopram 40 mg  p.o. daily for management of behavioral disturbances. - Continue naltrexone 25 mg daily  - Continue melatonin and Trazodone100mg  po qhs  - Consider tapering off or adjust dosing Klonopin if medically appropriate.  She is currently on Klonopin 1mg  po Q6 hr, and is requesting it daily.  -Continue to [facility] for [facility]-term management of chronic psychiatric condition, unpredictable and erratic behaviors, worsening aggression and rapid cycling that results in dangerous behaviors and insult to injury to self and others.   Disposition: Recommend inpatient  ad[facility]. Please coordinate referral with SW to faciliate admisison to [facility]. Patient appears to be treatment resistment, and all services have been maximized.     Maryagnes Amos, FNP 06/16/2020 6:11 PM

## 2020-06-16 NOTE — Progress Notes (Addendum)
[facility] and that patient is still on the priority waiting list.  CSW submitted inquiry to [facility] to determine if they could adequately care for patient in the outpatient setting.  Edwin Dada, MSW, LCSW Transitions of Care  Clinical Social Worker II [phone]

## 2020-06-17 NOTE — Progress Notes (Signed)
CSW sent updated psych note to [facility] for review.  Edwin Dada, MSW, LCSW Transitions of Care  Clinical Social Worker II [phone]

## 2020-06-17 NOTE — Progress Notes (Signed)
[facility] PROGRESS NOTE  Anita Davis WCB:[id number] DOB: [birth year] DOA: 01/04/2020 PCP: Anita Hawking, PA-C    02/10/20                      02/15/20                       04/08/20    04/01/21  Status: Remains inpatient appropriate because:Altered mental status, Unsafe d/c plan and Inpatient level of care appropriate due to severity of illness   Dispo: The patient is from: Home              [facility]ticip[facility]              Anticipated d/c date is: > 3 days              Patient currently is medically stable to d/c.  Barriers to discharge: Significant aggressive and violent outbursts have improved w/ no episodes sine 1/24. Remains on priority list for [facility].  Her father and stepmother now have guardianship of this patient and are planning to bring a copy of this documentation to the hospital. Next IVC due 3/1  On 2/24 have asked psych to reevaluate this patient to determine if she still requires placement at [facility] or if she would be appropriate to discharge to the home environment with appropriate psychiatric follow-up.  Of note she still does not have Medicaid so there may be some difficulty in obtaining medications.  In the interim it is determined that patient can discharge to home we have reached out to [facility] to determine if she would be an appropriate patient for their clinic.  She definitely needs to be assigned a psychiatrist for medication management and likely needs to be enrolled in some sort of day program.     Code Status: DNR Family Communication: Stepmother Anita Davis 2/24 by text; patient's father is at the bedside on 2/25 and updated on med changes recently and plans to continue to pursue [facility] due to anticipated need for long-term psychiatric treatment not available within the [facility]. 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  Patient presented to [facility], ER in [place] several times prior to admission.  Initial presentation was related to symptoms from acute cystitis.  Two other presentations related to insomnia.  On those occasions she was able to be discharged from the ER.  She returned again on 13 September being brought in by family for altered mental status.  After admission this patient developed seizure activity which progressed to status epilepticus therefore she was transferred to [facility] for further neurological evaluation.  After several days recurrent seizures were aborted with combination of 3 antiepileptic drugs.  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  Fortunately this did not improve patient's mentation and she continued to have waxing and waning episodes of alertness and encephalopathy.  Since admission she has not had any further seizures so these medications were discontinued.  She has eventually awakened behaviors alternating between awake and appropriate, awake crying and depressed occasionally delusional, severe agitation and aggression with significant delusions.  She has been evaluated both by neurology and psychiatry who suspect a combination of sequelae from underlying brain injury as well as bipolar disorder with schizoaffective features.  During one of her alert phases patient did admit to being diagnosed with bipolar disease prior to admission.  Patient is on multiple psychotropic medications continued to have episodes of violent outbursts.  She had required frequent doses of IM Geodon as well as Recruitment consultant and frequent intervention by security  staff during her episodes of severe agitation.  Recent chart review by [facility] states patient would not be a candidate for ECT given recent brain injury sequela as well as presentation with status epilepticus.  Anita Davis had been remaining stable regarding extreme agitation and/or violent behaviors since 1/24 when she had received her last Geodon IM dose.  On the evening of 2/4 she was also given a dose of IM Geodon.  There was no chart documentation stating what type of behavior she was experiencing but typically this medication is only given if she has extreme agitation.  Subjective: Patient awake and sitting on side of bed.  She had just eaten some pancakes brought to her by her father is also sitting at the side of the bed.  He has been updated on status.  Objective: Vitals:   06/16/20 2139 06/17/20 0615  BP: 104/78 102/88  Pulse: 62 64  Resp: 16 16  Temp: 97.6 F (36.4 C) 98 F (36.7 C)  SpO2: 97% 98%    Intake/Output Summary (Last 24 hours) at 06/17/2020 0824 Last data filed at 06/17/2020 0552 Gross per 24 hour  Intake 720 ml  Output 2 ml  Net 718 ml   Filed Weights   06/12/20 0704 06/13/20 0431 06/17/20 0615  Weight: 81 kg 81.4 kg 81.5 kg    Exam: General: Alert, calm in no acute distress Pulmonary:  Room air, lungs are clear posteriorly Cardiac:  S1S2, skin warm and dry and pink, no tachycardia Abdomen: Soft, eating well.  LBM 2/22 Neurological:  Cranial nerves 2-12 intact, no focal neurological deficits and ambulates without difficulty Psychiatric: Awaken, makes eye contact today but is very slow to respond.  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 During periods of alertness and lucidity patient denied use of methamphetamines prior to admission although did admit to abruptly stopping her methadone -Remains intermittently oriented with episodes of delusion and lack of insight into her current psychiatric status -Continue max dose Geodon 80 mg BID, naltrexone, scheduled Klonopin, Tegretol, Celexa and Remeron at hour of sleep -LFTs trending up so will decrease Tegretol to 400 mg TID -repeat LFTs in 2-3 weeks-2/24 total level 13.4 which is greater than 12 therefore need to new current dose of Tegretol **2/24 reevaluated by psych with the following recommendations: Continue IVC due to high risk of self-harm and others and safety precautions with safety sitter for unpredictable behavior Continue current antipsychotics with as needed dosing available for breakthrough agitation aggression and psychosis Continue  current decreased dose of Tegretol-Tegretol level obtained with goal<12 Increase citalopram for management of behavioral disturbance Continue naltrexone Continue melatonin and trazodone for insomnia Consider tapering off or adjusting dosage of Klonopin if medically appropriate- she is on high-dose and requested daily Most importantly, continue to recommend inpatient [facility] for long-term management of chronic psychiatric condition, unpredictable and erratic behaviors, worsening aggression and rapid cycling that results in dangerous behaviors and insult or injury to self and others  Severe insomnia -Continue Remeron and trazodone   History of polysubstance abuse with heroin/history  of outpatient methadone treatment -Prescribed methadone 60 mg daily from a clinic in [place] prior to admission -Continue naltrexone to treat heroin addiction as well as recent issues with impulsivity and hypersexual behaviors during admission which are suspected related to brain injury and worsening psychiatric condition   Depression and severe anxiety disorder/PTSD/no bipolar disorder now with schizoaffective features -Extremes in behavior have significantly improved-continue IVC (last updated 2/8) -See above regarding pharmacotherapy  Recurrent constipation/nausea -Continue Colace 100 mg twice daily prn -Continue MiraLAX to twice daily - has had multiple bowel movements since initiation of scheduled lactulose-lactulose could be contributing to ongoing nausea therefore will change to prn -As a precaution with her history of recurrent UTI will check urinalysis and culture to ensure nausea not secondary to UTI    Other problems: Status epilepticus -Resolved -Suspect precipitated by abrupt withdrawal of methadone as reported by patient -Repeat MRI on 12/28 within normal limits and it is felt behavioral issues related to brain injury sequelae and chronic psychiatric condition.  Neurology has signed off -Vimpat, Dilantin and phenobarbital have been tapered and discontinued at recommendation of neurologist  Nonobstructive transaminitis in context of hepatitis C -Elevated HCV antibody with markedly elevated HCV RNA quantitative level  consistent with chronic hepatitis C  -LFTs remain stable without an obstructive pattern noted -11/10: Discussed with ID Dr. Manson Passey. Treatment of hepatitis C typically is initiated in the outpatient setting. Medications can be quite expensive and usually take time to obtain insurance approval.  -ID recommends scheduling outpatient appointment their clinic closer to discharge date  Back pain 2/2 abnormal urinalysis consistent with UTI -Patient with  low-grade fever overnight and is now having back pain for days not responsive to NSAIDs and muscle relaxers -DG lumbar spine unremarkable -During hospitalization patient has been treated for pansensitive E. coli UTI as well as Salmonella UTI both of which have been sensitive to Levaquin -Completed Levaquin 12/20-urine culture obtained after antibiotics initiated and showed no growth -CT abdomen and pelvis on 12/17 unremarkable  Salmonella UTI -Completed amoxicillin  Abnormal urinalysis -Not consistent with UTI, was positive for moderate hemoglobin and greater than 50 RBCs with an elevated specific gravity -Suspect dehydration -Patient denies back pain or history of renal calculi although given her confusion unclear if this history is accurate  Physical deconditioning -Resolved -Does continue to exhibit impulsivity but maintains appropriate balance and gait no longer requires one-to-one safety sitter  Large hemorrhoids. -Resolved -Markedly improved-LD Proctofoam 12/15  Dysphagia -Resolved -Continue regular diet   Data Reviewed: Basic Metabolic Panel: Recent Labs  Lab 06/16/20 0106  NA 133*  K 3.9  CL 100  CO2 24  GLUCOSE 130*  BUN 13  CREATININE 0.69  CALCIUM 8.6*   Liver Function Tests: Recent Labs  Lab 06/16/20 0106  AST 104*  ALT 103*  ALKPHOS 98  BILITOT 0.5  PROT 5.8*  ALBUMIN 3.3*   No results for input(s): LIPASE, AMYLASE in the  last 168 hours. No results for input(s): AMMONIA in the last 168 hours. CBC: Recent Labs  Lab 06/16/20 0106  WBC 3.5*  HGB 12.3  HCT 36.6  MCV 93.1  PLT 246   Cardiac Enzymes: No results for input(s): CKTOTAL, CKMB, CKMBINDEX, TROPONINI in the last 168 hours. BNP (last 3 results) No results for input(s): BNP in the last 8760 hours.  ProBNP (last 3 results) No results for input(s): PROBNP in the last 8760 hours.  CBG: No results for input(s): GLUCAP in the last 168 hours.  No results found for this or any  previous visit (from the past 240 hour(s)).   Studies: No results found.  Scheduled Meds: . carbamazepine  400 mg Oral TID  . citalopram  40 mg Oral Daily  . clonazePAM  1 mg Oral Q6H  . docusate sodium  100 mg Oral BID  . feeding supplement  237 mL Oral BID BM  . hydrocortisone cream   Topical BID  . melatonin  10 mg Oral QHS  . mirtazapine  30 mg Oral QHS  . multivitamin with minerals  1 tablet Oral Daily  . naltrexone  25 mg Oral Daily  . polyethylene glycol  17 g Oral BID  . vitamin B-6  100 mg Oral Daily  . thiamine  100 mg Oral Daily  . traZODone  150 mg Oral QHS  . ziprasidone  80 mg Oral 2 times per day   Continuous Infusions:   Principal Problem:   Delirium due to multiple etiologies Active Problems:   Acute metabolic encephalopathy   Hypokalemia   Polysubstance abuse (HCC)   Elevated CK   Transaminitis   Refractory seizure (HCC)   Chronic hepatitis C without hepatic coma (HCC)   Distended abdomen   Palliative care encounter   Colonic Ileus (HCC)   Organic brain syndrome   On enteral nutrition   Physical deconditioning   Protein-calorie malnutrition (HCC)   Inadequate oral nutritional intake   Impulse disorder   Consultants:  Neurology  Psychiatry  Interventional radiology  Surgery    Procedures:  9/14 lumbar puncture  9/15 EEG  9/16 EEG  9/17 EEG  9/19 overnight EEG with video  9/22 overnight EEG with video with discontinuation of long-term EEG monitoring on 9/25  9/24 core track placement  10/6 EEG  12/15 PEG tube removed    Antibiotics: Anti-infectives (From admission, onward)   Start     Dose/Rate Route Frequency Ordered Stop   05/11/20 0845  amoxicillin-clavulanate (AUGMENTIN) 500-125 MG per tablet 500 mg        1 tablet Oral Every 8 hours 05/11/20 0750 05/18/20 0559   05/11/20 0845  metroNIDAZOLE (FLAGYL) tablet 500 mg        500 mg Oral Every 8 hours 05/11/20 0750 05/18/20 0559   04/05/20 1415  levofloxacin  (LEVAQUIN) tablet 500 mg        500 mg Oral Daily 04/05/20 1327 04/11/20 0911   03/19/20 1000  metroNIDAZOLE (FLAGYL) tablet 500 mg  Status:  Discontinued        500 mg Per Tube 2 times daily 03/19/20 0822 03/23/20 0827   03/18/20 1200  amoxicillin (AMOXIL) 250 MG/5ML suspension 500 mg  Status:  Discontinued        500 mg Per Tube Every 8 hours 03/18/20 0915 03/23/20 0559   03/17/20 1200  cefTRIAXone (ROCEPHIN) 1 g in sodium chloride 0.9 % 100 mL IVPB  Status:  Discontinued  1 g 200 mL/hr over 30 Minutes Intravenous Every 24 hours 03/17/20 1048 03/18/20 0913   03/16/20 1130  metroNIDAZOLE (FLAGYL) 50 mg/ml oral suspension 500 mg  Status:  Discontinued        500 mg Per Tube 2 times daily 03/16/20 1040 03/19/20 0821   03/16/20 1130  fluconazole (DIFLUCAN) 40 MG/ML suspension 152 mg        150 mg Per Tube  Once 03/16/20 1040 03/16/20 1245   02/11/20 1526  ceFAZolin (ANCEF) IVPB 2g/100 mL premix        2 g 200 mL/hr over 30 Minutes Intravenous  Once 02/11/20 1526 02/11/20 1641   02/05/20 0600  ceFAZolin (ANCEF) IVPB 2g/100 mL premix        2 g 200 mL/hr over 30 Minutes Intravenous To Short Stay 02/04/20 1054 02/05/20 0950   01/29/20 0600  ceFAZolin (ANCEF) IVPB 2g/100 mL premix        2 g 200 mL/hr over 30 Minutes Intravenous On call to O.R. 01/28/20 1511 01/30/20 0559   01/28/20 2000  cefTRIAXone (ROCEPHIN) 1 g in sodium chloride 0.9 % 100 mL IVPB        1 g 200 mL/hr over 30 Minutes Intravenous Every 24 hours 01/28/20 1925 02/02/20 0724   01/06/20 0900  cefTRIAXone (ROCEPHIN) 2 g in sodium chloride 0.9 % 100 mL IVPB  Status:  Discontinued        2 g 200 mL/hr over 30 Minutes Intravenous Every 12 hours 01/06/20 0105 01/06/20 2202   01/06/20 0800  vancomycin (VANCOCIN) IVPB 1000 mg/200 mL premix  Status:  Discontinued       "Followed by" Linked Group Details   1,000 mg 200 mL/hr over 60 Minutes Intravenous Every 12 hours 01/05/20 1904 01/06/20 2202   01/05/20 2000  vancomycin  (VANCOREADY) IVPB 1500 mg/300 mL       "Followed by" Linked Group Details   1,500 mg 150 mL/hr over 120 Minutes Intravenous  Once 01/05/20 1904 01/06/20 0127   01/05/20 1830  cefTRIAXone (ROCEPHIN) 2 g in sodium chloride 0.9 % 100 mL IVPB        2 g 200 mL/hr over 30 Minutes Intravenous  Once 01/05/20 1829 01/05/20 2220       Time spent: 20 minutes    Junious SilkAllison Benicio Manna ANP  [facility] 7 am - 330 pm/M-F for direct patient care and secure chat Please see Amion for contact information 163 days

## 2020-06-18 NOTE — Consult Note (Signed)
Please note that patient was placed on Tegretol by the Neurologist for seizure disorder.  Recommendation:  Pleas contact Neurologist to determine if the the current Tegretol level is acceptable to them.  Thedore Mins, MD Attending psychiatrist

## 2020-06-18 NOTE — Plan of Care (Signed)
  Problem: Education: Goal: Knowledge of General Education information will improve Description Including pain rating scale, medication(s)/side effects and non-pharmacologic comfort measures Outcome: Progressing   

## 2020-06-18 NOTE — Progress Notes (Signed)
PROGRESS NOTE    Anita Davis  ZOX:[id number] DOB: Jul 09, 1985 DOA: 01/04/2020 PCP: Jacquelin HawkingMcElroy, Shannon, PA-C   Chief Complaint  Patient presents with  . 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  After admission this patient developed seizure activity which progressed to status epilepticus> transferred to [facility].After several days recurrent seizures were aborted with combination of 3 antiepileptic drugs.  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  Fortunately this did not improve patient's mentation and she continued to have waxing and waning episodes of alertness and encephalopathy.  Since admission she has not had any further seizures so these medications were discontinued.  She has eventually awakened behaviors alternating between awake and appropriate, awake crying and depressed occasionally delusional, severe agitation and aggression with significant delusions.  She has been evaluated both by neurology and psychiatry who suspect a combination of sequelae from underlying brain injury as well as bipolar disorder with schizoaffective features.  During one of her alert phases patient did admit to being diagnosed with bipolar disease prior to admission.  Patient is on multiple psychotropic medications continued to have episodes of violent outbursts.  She had required frequent doses of IM Geodon as well as Recruitment consultantsafety sitter and frequent intervention by security staff during her episodes of severe agitation.  Recent chart review by [facility] states patient would not be a candidate  for ECT given recent brain injury sequela as well as presentation with status epilepticus.  She had been remaining stable regarding extreme agitation and/or violent behaviors since 1/24 ,on the evening of 2/4 she was also given a dose of IM Geodon.  Patient had prolonged hospitalization at this time being followed by inpatient psychiatry last seen 06/16/20  Subjective: Seen and examined this morning.  She is alert awake resting comfortably on the floor with Matt and watching movies on her iPad. One-to-one sitter at the bedside.  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  Advised to continue safety precaution one-to-one, continue IVC due to high risk of self-harm and others, continue Geodon, with monitoring of QTC on EKG, continue Tegretol 40 mg 3 times daily recently reduced due to elevated LFT and level being monitored at goal 12-  Level 13.4 06/17/19-consulted psych to notify, continue Celexa dose increased to 40, naltrexone 25, melatonin and trazodone for sleep.  To consider tapering dose of Klonopin if medically appropriate.  And at this time psychiatric continues to recommend state psychiatric hospital for long-term management of chronic psychiatric condition on predictable and erratic behavior worsening aggression and rapid cycling that results in dangerous behaviors and insults to injury to self.  Recurrent constipation/nausea: Continue laxatives MiraLAX  Seizure disorder status epilepticus, has resolved underwent extensive work-up with MRI, LP and seen by neurology.  Was on Vimpat Dilantin and phenobarbital that has been tapered and discontinued.  Remains seizure-free.  Transaminitis/history hepatitis C-chronic.  Monitor LFTs intermittently.  ID has recommended outpatient hep C treatment.  Monitor LFTs intermittently and also Tegretol  level  UTI/history of recurrent UTI/back pain patient was treated for E. coli UTI as  well as Salmonella UTI during this hospitalization CT abdomen pelvis 12/17 was stable  Physical deconditioning/debility increase activity ambulate with assistance  Dysphagia resolved Large hemorrhoid   Overweight with BMI 29.9: Will benefit with weight loss.  Status post Covid vaccination second dose 02/23/2020  Nutrition: Diet Order            Diet regular Room service appropriate? No; Fluid consistency: Thin  Diet effective now                 Nutrition Problem: Inadequate oral intake Etiology: lethargy/confusion Signs/Symptoms: meal completion < 25% Interventions: Tube feeding,Prostat Patient's Body mass index is 29.9 kg/m.  DVT prophylaxis: SCDs Start: 01/05/20 2217 patient is ambulating well. Code Status:   Code Status: DNR  Family Communication: plan of care discussed with nursing staffs  Status is: Inpatient Remains inpatient appropriate because:Unsafe d/c plan  Dispo: The patient is from: Home              Anticipated d/c is to: Inpatient psychiatric facility              Patient currently is medically stable to d/c.   Difficult to place patient Yes  Consultants:see note  Procedures:see note  Unresulted Labs (From admission, onward)          Start     Ordered   04/24/20 0500  CBC with Differential/Platelet  Tomorrow morning,   R       Question:  Specimen collection method  Answer:  Lab=Lab collect   04/23/20 1742          Culture/Microbiology    Component Value Date/Time   SDES URINE, CATHETERIZED 06/03/2020 0211   SPECREQUEST  06/03/2020 0211    NONE Performed at [facility], [address] [place], [place] [address]    CULT MULTIPLE SPECIES PRESENT, SUGGEST RECOLLECTION (A) 06/03/2020 0211   REPTSTATUS 06/05/2020 FINAL 06/03/2020 0211    Other culture-see note  Medications: Scheduled Meds: . carbamazepine  400 mg Oral TID  . citalopram  40 mg  Oral Daily  . clonazePAM  1 mg Oral Q6H  . docusate sodium  100 mg Oral BID  . feeding supplement  237 mL Oral BID BM  . melatonin  10 mg Oral QHS  . mirtazapine  30 mg Oral QHS  . multivitamin with minerals  1 tablet Oral Daily  . naltrexone  25 mg Oral Daily  . polyethylene glycol  17 g Oral BID  . vitamin B-6  100 mg Oral Daily  . thiamine  100 mg Oral Daily  . traZODone  150 mg Oral QHS  . ziprasidone  80 mg Oral 2 times per day   Continuous Infusions:  Antimicrobials: Anti-infectives (From admission, onward)   Start     Dose/Rate Route Frequency Ordered Stop   05/11/20 0845  amoxicillin-clavulanate (AUGMENTIN) 500-125 MG per tablet 500 mg        1 tablet Oral Every 8 hours 05/11/20 0750 05/18/20 0559   05/11/20 0845  metroNIDAZOLE (FLAGYL) tablet 500 mg        500 mg Oral Every 8 hours 05/11/20 0750 05/18/20 0559   04/05/20 1415  levofloxacin (LEVAQUIN) tablet 500 mg        500 mg Oral Daily 04/05/20 1327 04/11/20 0911   03/19/20 1000  metroNIDAZOLE (FLAGYL) tablet 500 mg  Status:  Discontinued        500 mg Per Tube 2 times daily 03/19/20 0822 03/23/20 0827   03/18/20  1200  amoxicillin (AMOXIL) 250 MG/5ML suspension 500 mg  Status:  Discontinued        500 mg Per Tube Every 8 hours 03/18/20 0915 03/23/20 0559   03/17/20 1200  cefTRIAXone (ROCEPHIN) 1 g in sodium chloride 0.9 % 100 mL IVPB  Status:  Discontinued        1 g 200 mL/hr over 30 Minutes Intravenous Every 24 hours 03/17/20 1048 03/18/20 0913   03/16/20 1130  metroNIDAZOLE (FLAGYL) 50 mg/ml oral suspension 500 mg  Status:  Discontinued        500 mg Per Tube 2 times daily 03/16/20 1040 03/19/20 0821   03/16/20 1130  fluconazole (DIFLUCAN) 40 MG/ML suspension 152 mg        150 mg Per Tube  Once 03/16/20 1040 03/16/20 1245   02/11/20 1526  ceFAZolin (ANCEF) IVPB 2g/100 mL premix        2 g 200 mL/hr over 30 Minutes Intravenous  Once 02/11/20 1526 02/11/20 1641   02/05/20 0600  ceFAZolin (ANCEF) IVPB 2g/100 mL  premix        2 g 200 mL/hr over 30 Minutes Intravenous To Short Stay 02/04/20 1054 02/05/20 0950   01/29/20 0600  ceFAZolin (ANCEF) IVPB 2g/100 mL premix        2 g 200 mL/hr over 30 Minutes Intravenous On call to O.R. 01/28/20 1511 01/30/20 0559   01/28/20 2000  cefTRIAXone (ROCEPHIN) 1 g in sodium chloride 0.9 % 100 mL IVPB        1 g 200 mL/hr over 30 Minutes Intravenous Every 24 hours 01/28/20 1925 02/02/20 0724   01/06/20 0900  cefTRIAXone (ROCEPHIN) 2 g in sodium chloride 0.9 % 100 mL IVPB  Status:  Discontinued        2 g 200 mL/hr over 30 Minutes Intravenous Every 12 hours 01/06/20 0105 01/06/20 2202   01/06/20 0800  vancomycin (VANCOCIN) IVPB 1000 mg/200 mL premix  Status:  Discontinued       "Followed by" Linked Group Details   1,000 mg 200 mL/hr over 60 Minutes Intravenous Every 12 hours 01/05/20 1904 01/06/20 2202   01/05/20 2000  vancomycin (VANCOREADY) IVPB 1500 mg/300 mL       "Followed by" Linked Group Details   1,500 mg 150 mL/hr over 120 Minutes Intravenous  Once 01/05/20 1904 01/06/20 0127   01/05/20 1830  cefTRIAXone (ROCEPHIN) 2 g in sodium chloride 0.9 % 100 mL IVPB        2 g 200 mL/hr over 30 Minutes Intravenous  Once 01/05/20 1829 01/05/20 2220     Objective: Vitals: Today's Vitals   06/17/20 1814 06/17/20 2212 06/18/20 0200 06/18/20 0503  BP:  100/66  105/78  Pulse:  64  62  Resp:  16  18  Temp:  98 F (36.7 C)  98.1 F (36.7 C)  TempSrc:  Oral  Oral  SpO2:  98%  98%  Weight:    81.5 kg  Height:      PainSc: 5   Asleep     Intake/Output Summary (Last 24 hours) at 06/18/2020 0901 Last data filed at 06/18/2020 0731 Gross per 24 hour  Intake 800 ml  Output --  Net 800 ml   Filed Weights   06/13/20 0431 06/17/20 0615 06/18/20 0503  Weight: 81.4 kg 81.5 kg 81.5 kg   Weight change: -0.014 kg  Intake/Output from previous day: 02/25 0701 - 02/26 0700 In: 560 [P.O.:560] Out: -  Intake/Output this shift: Total I/O In:  480 [P.O.:480] Out: -   Filed Weights   06/13/20 0431 06/17/20 0615 06/18/20 0503  Weight: 81.4 kg 81.5 kg 81.5 kg    Examination: General exam: Alert awake conversant flat affect does not make eye contact old for age, obese Noted HEENT:Oral mucosa moist, Ear/Nose WNL grossly,dentition normal. Respiratory system: bilaterally clear ,no wheezing or crackles,no use of accessory muscle, non tender. Cardiovascular system: S1 & S2 +, regular, No JVD. Gastrointestinal system: Abdomen soft,Obese, soft NT,ND, BS+. Nervous System:Alert, awake, moving extremities and grossly nonfocal Extremities: No edema, distal peripheral pulses palpable.  Skin: No rashes,no icterus. MSK: Normal muscle bulk,tone, power  Data Reviewed: I have personally reviewed following labs and imaging studies CBC: Recent Labs  Lab 06/16/20 0106  WBC 3.5*  HGB 12.3  HCT 36.6  MCV 93.1  PLT 246   Basic Metabolic Panel: Recent Labs  Lab 06/16/20 0106  NA 133*  K 3.9  CL 100  CO2 24  GLUCOSE 130*  BUN 13  CREATININE 0.69  CALCIUM 8.6*   GFR: Estimated Creatinine Clearance: 102.5 mL/min (by C-G formula based on SCr of 0.69 mg/dL). Liver Function Tests: Recent Labs  Lab 06/16/20 0106  AST 104*  ALT 103*  ALKPHOS 98  BILITOT 0.5  PROT 5.8*  ALBUMIN 3.3*   No results for input(s): LIPASE, AMYLASE in the last 168 hours. No results for input(s): AMMONIA in the last 168 hours. Coagulation Profile: Recent Labs  Lab 06/16/20 0106  INR 1.0   Cardiac Enzymes: No results for input(s): CKTOTAL, CKMB, CKMBINDEX, TROPONINI in the last 168 hours. BNP (last 3 results) No results for input(s): PROBNP in the last 8760 hours. HbA1C: No results for input(s): HGBA1C in the last 72 hours. CBG: No results for input(s): GLUCAP in the last 168 hours. Lipid Profile: No results for input(s): CHOL, HDL, LDLCALC, TRIG, CHOLHDL, LDLDIRECT in the last 72 hours. Thyroid Function Tests: Recent Labs    06/16/20 0106  TSH 3.084   Anemia  Panel: No results for input(s): VITAMINB12, FOLATE, FERRITIN, TIBC, IRON, RETICCTPCT in the last 72 hours. Sepsis Labs: No results for input(s): PROCALCITON, LATICACIDVEN in the last 168 hours.  No results found for this or any previous visit (from the past 240 hour(s)).   Radiology Studies: No results found.   LOS: 164 days   Lanae Boast, MD [facility]  06/18/2020, 9:01 AM

## 2020-06-19 NOTE — Progress Notes (Signed)
PROGRESS NOTE    Anita Davis  ZOX:[id number] DOB: 04-03-1986 DOA: 01/04/2020 PCP: Jacquelin HawkingMcElroy, Shannon, PA-C   Chief Complaint  Patient presents with  . 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  After admission this patient developed seizure activity which progressed to status epilepticus> [facility] to [facility].After several days recurrent seizures were aborted with combination of 3 antiepileptic drugs.  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  Fortunately this did not improve patient's mentation and she continued to have waxing and waning episodes of alertness and encephalopathy.  Since admission she has not had any further seizures so these medications were discontinued.  She has eventually awakened behaviors alternating between awake and appropriate, awake crying and depressed occasionally delusional, severe agitation and aggression with significant delusions.  She has been evaluated both by neurology and psychiatry who suspect a combination of sequelae from underlying brain injury as well as bipolar disorder with schizoaffective features.  During one of her alert phases patient did admit to being diagnosed with bipolar disease prior to admission.  Patient is on multiple psychotropic medications continued to have episodes of violent outbursts.  She had required frequent doses of IM Geodon as well as Recruitment consultantsafety sitter and frequent intervention by security staff during her episodes of severe agitation.  [facility] states patient would not be a candidate  for ECT given recent brain injury sequela as well as presentation with status epilepticus.  She had been stable regarding extreme agitation and/or violent behaviors since 1/24 ,on the evening of 2/4 she was also given a dose of IM Geodon. Patient had prolonged hospitalization at this time being followed by inpatient psychiatry last seen 06/16/20  Subjective:  Afebrile overnight.  Vitals stable. One-to-one sitter at the bedside. Resting comfortably on the bed.  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 Cont current medication management to stabilize her mood with Geodon,(monitor QTC intermittently), continue Tegretol 400 mg TID - dose reduced last week due to elevated LFT and Level 13.4 06/17/19, continue Celexa dose increased to 40, naltrexone, melatonin and trazodone.. Patient as per psychiatric recommendation needs inhospital long-term management of chronic psychiatric condition.  Recurrent constipation/nausea: Continue laxatives MiraLAX  Seizure disorder status epilepticus, has resolved underwent extensive work-up with MRI, LP and seen by neurology.  Was on Vimpat Dilantin and phenobarbital that has been tapered and discontinued.  Remains seizure-free.  Transaminitis/history hepatitis C-chronic.  Monitor LFTs intermittently.  ID has recommended outpatient hep C treatment.  Monitor LFTs intermittently and also Tegretol level  UTI/history of recurrent UTI/back pain patient was treated for E. coli UTI as well as Salmonella UTI during this hospitalization CT abdomen pelvis 12/17 was stable  Physical deconditioning/debility increase activity ambulate  with assistance  Dysphagia resolved  Large hemorrhoid hx  Overweight with BMI 29.9: Will benefit with weight loss.  Status post Covid  vaccination second dose 02/23/2020 Discussed with the patient to ambulate with one-to-one in the room and outside in the hallway which will help her and also minimize DVT.  Nutrition: Diet Order            Diet regular Room service appropriate? No; Fluid consistency: Thin  Diet effective now                 Nutrition Problem: Inadequate oral intake Etiology: lethargy/confusion Signs/Symptoms: meal completion < 25% Interventions: Tube feeding,Prostat Patient's Body mass index is 29.84 kg/m.  DVT prophylaxis: SCDs Start: 01/05/20 2217 patient is ambulating well few times a day and active inside room. Code Status:   Code Status: DNR  Family Communication: plan of care discussed with nursing staffs, patient  Status is: Inpatient Remains inpatient appropriate because:Unsafe d/c plan  Dispo: The patient is from: Home              Anticipated d/c is to: Inpatient psychiatric facility              Patient currently is medically stable to d/c.   Difficult to place patient Yes  Consultants:see note  Procedures:see note  Unresulted Labs (From admission, onward)          Start     Ordered   04/24/20 0500  CBC with Differential/Platelet  Tomorrow morning,   R       Question:  Specimen collection method  Answer:  Lab=Lab collect   04/23/20 1742          Culture/Microbiology    Component Value Date/Time   SDES URINE, CATHETERIZED 06/03/2020 0211   SPECREQUEST  06/03/2020 0211    NONE Performed at [facility], [address] [place], [place] [address]    CULT MULTIPLE SPECIES PRESENT, SUGGEST RECOLLECTION (A) 06/03/2020 0211   REPTSTATUS 06/05/2020 FINAL 06/03/2020 0211    Other culture-see note  Medications: Scheduled Meds: . carbamazepine  400 mg Oral TID  . citalopram  40 mg Oral Daily  . clonazePAM  1 mg Oral Q6H  .  docusate sodium  100 mg Oral BID  . feeding supplement  237 mL Oral BID BM  . melatonin  10 mg Oral QHS  . mirtazapine  30 mg Oral QHS  . multivitamin with minerals  1 tablet Oral Daily  . naltrexone  25 mg Oral Daily  . polyethylene glycol  17 g Oral BID  . vitamin B-6  100 mg Oral Daily  . thiamine  100 mg Oral Daily  . traZODone  150 mg Oral QHS  . ziprasidone  80 mg Oral 2 times per day   Continuous Infusions:  Antimicrobials: Anti-infectives (From admission, onward)   Start     Dose/Rate Route Frequency Ordered Stop   05/11/20 0845  amoxicillin-clavulanate (AUGMENTIN) 500-125 MG per tablet 500 mg        1 tablet Oral Every 8 hours 05/11/20 0750 05/18/20 0559   05/11/20 0845  metroNIDAZOLE (FLAGYL) tablet 500 mg        500 mg Oral Every 8 hours 05/11/20 0750 05/18/20 0559   04/05/20 1415  levofloxacin (LEVAQUIN) tablet 500 mg        500 mg Oral Daily 04/05/20 1327 04/11/20 0911   03/19/20 1000  metroNIDAZOLE (FLAGYL) tablet 500 mg  Status:  Discontinued        500 mg Per Tube 2 times daily 03/19/20 0822 03/23/20 0827   03/18/20 1200  amoxicillin (AMOXIL) 250 MG/5ML suspension 500  mg  Status:  Discontinued        500 mg Per Tube Every 8 hours 03/18/20 0915 03/23/20 0559   03/17/20 1200  cefTRIAXone (ROCEPHIN) 1 g in sodium chloride 0.9 % 100 mL IVPB  Status:  Discontinued        1 g 200 mL/hr over 30 Minutes Intravenous Every 24 hours 03/17/20 1048 03/18/20 0913   03/16/20 1130  metroNIDAZOLE (FLAGYL) 50 mg/ml oral suspension 500 mg  Status:  Discontinued        500 mg Per Tube 2 times daily 03/16/20 1040 03/19/20 0821   03/16/20 1130  fluconazole (DIFLUCAN) 40 MG/ML suspension 152 mg        150 mg Per Tube  Once 03/16/20 1040 03/16/20 1245   02/11/20 1526  ceFAZolin (ANCEF) IVPB 2g/100 mL premix        2 g 200 mL/hr over 30 Minutes Intravenous  Once 02/11/20 1526 02/11/20 1641   02/05/20 0600  ceFAZolin (ANCEF) IVPB 2g/100 mL premix        2 g 200 mL/hr over 30 Minutes  Intravenous To Short Stay 02/04/20 1054 02/05/20 0950   01/29/20 0600  ceFAZolin (ANCEF) IVPB 2g/100 mL premix        2 g 200 mL/hr over 30 Minutes Intravenous On call to O.R. 01/28/20 1511 01/30/20 0559   01/28/20 2000  cefTRIAXone (ROCEPHIN) 1 g in sodium chloride 0.9 % 100 mL IVPB        1 g 200 mL/hr over 30 Minutes Intravenous Every 24 hours 01/28/20 1925 02/02/20 0724   01/06/20 0900  cefTRIAXone (ROCEPHIN) 2 g in sodium chloride 0.9 % 100 mL IVPB  Status:  Discontinued        2 g 200 mL/hr over 30 Minutes Intravenous Every 12 hours 01/06/20 0105 01/06/20 2202   01/06/20 0800  vancomycin (VANCOCIN) IVPB 1000 mg/200 mL premix  Status:  Discontinued       "Followed by" Linked Group Details   1,000 mg 200 mL/hr over 60 Minutes Intravenous Every 12 hours 01/05/20 1904 01/06/20 2202   01/05/20 2000  vancomycin (VANCOREADY) IVPB 1500 mg/300 mL       "Followed by" Linked Group Details   1,500 mg 150 mL/hr over 120 Minutes Intravenous  Once 01/05/20 1904 01/06/20 0127   01/05/20 1830  cefTRIAXone (ROCEPHIN) 2 g in sodium chloride 0.9 % 100 mL IVPB        2 g 200 mL/hr over 30 Minutes Intravenous  Once 01/05/20 1829 01/05/20 2220     Objective: Vitals: Today's Vitals   06/18/20 1520 06/18/20 2126 06/18/20 2219 06/19/20 0514  BP: 102/71  100/77 102/68  Pulse: 60  62 60  Resp: 13  18 18   Temp: 97.9 F (36.6 C)  98.1 F (36.7 C) 98.2 F (36.8 C)  TempSrc: Oral  Oral Oral  SpO2: 98%  98% 98%  Weight:    81.3 kg  Height:      PainSc:  0-No pain      Intake/Output Summary (Last 24 hours) at 06/19/2020 0811 Last data filed at 06/18/2020 1300 Gross per 24 hour  Intake 400 ml  Output --  Net 400 ml   Filed Weights   06/17/20 0615 06/18/20 0503 06/19/20 0514  Weight: 81.5 kg 81.5 kg 81.3 kg   Weight change: -0.187 kg  Intake/Output from previous day: 02/26 0701 - 02/27 0700 In: 880 [P.O.:880] Out: -  Intake/Output this shift: No intake/output data recorded. Filed Weights  06/17/20 0615 06/18/20 0503 06/19/20 0514  Weight: 81.5 kg 81.5 kg 81.3 kg   Examination: General exam: Alert awake, mildly conversant, not making eye contact, flat affect.  Patient HEENT:Oral mucosa moist, Ear/Nose WNL grossly, dentition normal. Respiratory system: bilaterally clear,no wheezing or crackles,no use of accessory muscle. Cardiovascular system: S1 & S2 +, No JVD. Gastrointestinal system: Abdomen soft, NT,Obese, ND, BS+. Nervous System:Alert, awake, moving extremities and grossly nonfocal. Extremities: No edema, distal peripheral pulses palpable.  Skin: No rashes,no icterus. MSK: Normal muscle bulk,tone, power.  Data Reviewed: I have personally reviewed following labs and imaging studies CBC: Recent Labs  Lab 06/16/20 0106  WBC 3.5*  HGB 12.3  HCT 36.6  MCV 93.1  PLT 246   Basic Metabolic Panel: Recent Labs  Lab 06/16/20 0106  NA 133*  K 3.9  CL 100  CO2 24  GLUCOSE 130*  BUN 13  CREATININE 0.69  CALCIUM 8.6*   GFR: Estimated Creatinine Clearance: 102.4 mL/min (by C-G formula based on SCr of 0.69 mg/dL). Liver Function Tests: Recent Labs  Lab 06/16/20 0106  AST 104*  ALT 103*  ALKPHOS 98  BILITOT 0.5  PROT 5.8*  ALBUMIN 3.3*   No results for input(s): LIPASE, AMYLASE in the last 168 hours. No results for input(s): AMMONIA in the last 168 hours. Coagulation Profile: Recent Labs  Lab 06/16/20 0106  INR 1.0   Cardiac Enzymes: No results for input(s): CKTOTAL, CKMB, CKMBINDEX, TROPONINI in the last 168 hours. BNP (last 3 results) No results for input(s): PROBNP in the last 8760 hours. HbA1C: No results for input(s): HGBA1C in the last 72 hours. CBG: No results for input(s): GLUCAP in the last 168 hours. Lipid Profile: No results for input(s): CHOL, HDL, LDLCALC, TRIG, CHOLHDL, LDLDIRECT in the last 72 hours. Thyroid Function Tests: No results for input(s): TSH, T4TOTAL, FREET4, T3FREE, THYROIDAB in the last 72 hours. Anemia Panel: No  results for input(s): VITAMINB12, FOLATE, FERRITIN, TIBC, IRON, RETICCTPCT in the last 72 hours. Sepsis Labs: No results for input(s): PROCALCITON, LATICACIDVEN in the last 168 hours.  No results found for this or any previous visit (from the past 240 hour(s)).   Radiology Studies: No results found.   LOS: 165 days   Lanae Boast, MD [facility]  06/19/2020, 8:11 AM

## 2020-06-19 NOTE — Progress Notes (Signed)
Pt calm and cooperative today.  She asked for nausea medicine one time today. The medication provided relief.  Patient's father came to visit this afternoon. She smiled and was happy to spend time with him.

## 2020-06-20 NOTE — Progress Notes (Signed)
[facility] PROGRESS NOTE  Anita Davis WCB:[id number] DOB: [birth year] DOA: 01/04/2020 PCP: Anita Hawking, PA-C    02/10/20                      02/15/20                       04/08/20    04/01/21  Status: Remains inpatient appropriate because:Altered mental status, Unsafe d/c plan and Inpatient level of care appropriate due to severity of illness   Dispo: The patient is from: Home              [facility]              Anticipated d/c date is: > 3 days              Patient currently is medically stable to d/c.  Barriers to discharge: Significant aggressive and violent outbursts have improved w/ no episodes sine 1/24. Remains on priority list for [facility].  Her father and stepmother now have guardianship of this patient and are planning to bring a copy of this documentation to the hospital. Next IVC due 3/1  On 2/24 have asked psych to reevaluate this patient to determine if she still requires placement at [facility] or if she would be appropriate to discharge to the home environment with appropriate psychiatric follow-up.  Of note she still does not have Medicaid so there may be some difficulty in obtaining medications.  In the interim it is determined that patient can discharge to home we have reached out to [facility] to determine if she would be an appropriate patient for their clinic.  She definitely needs to be assigned a psychiatrist for medication management and likely needs to be enrolled in some sort of day program.     Code Status: DNR Family Communication: Stepmother Anita Davis 2/24 by text; patient's father is at the bedside on 2/25 and updated on med changes recently and plans to continue to pursue [facility] due to anticipated need for long-term psychiatric treatment not available within the [facility]. 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  Patient presented to [facility], ER in [place] several times prior to admission.  Initial presentation was related to symptoms from acute cystitis.  Two other presentations related to insomnia.  On those occasions she was able to be discharged from the ER.  She returned again on 13 September being brought in by family for altered mental status.  After admission this patient developed seizure activity which progressed to status epilepticus therefore she was transferred to [facility] for further neurological evaluation.  After several days recurrent seizures were aborted with combination of 3 antiepileptic drugs.  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  Fortunately this did not improve patient's mentation and she continued to have waxing and waning episodes of alertness and encephalopathy.  Since admission she has not had any further seizures so these medications were discontinued.  She has eventually awakened behaviors alternating between awake and appropriate, awake crying and depressed occasionally delusional, severe agitation and aggression with significant delusions.  She has been evaluated both by neurology and psychiatry who suspect a combination of sequelae from underlying brain injury as well as bipolar disorder with schizoaffective features.  During one of her alert phases patient did admit to being diagnosed with bipolar disease prior to admission.  Patient is on multiple psychotropic medications continued to have episodes of violent outbursts.  She had required frequent doses of IM Geodon as well as Recruitment consultant and frequent intervention by security  staff during her episodes of severe agitation.  Recent chart review by [facility] states patient would not be a candidate for ECT given recent brain injury sequela as well as presentation with status epilepticus.  [name] had been remaining stable regarding extreme agitation and/or violent behaviors since 1/24 when she had received her last Geodon IM dose.  On the evening of 2/4 she was also given a dose of IM Geodon.  There was no chart documentation stating what type of behavior she was experiencing but typically this medication is only given if she has extreme agitation.  Subjective: Awake.  Laying supine in bed.  Will not respond to questions verbally.  Avoiding eye contact.  No acute distress.  Objective: Vitals:   06/19/20 2308 06/20/20 0438  BP: 100/87 98/66  Pulse: 60 60  Resp: 18 18  Temp: 97.8 F (36.6 C) 98 F (36.7 C)  SpO2: 98% 98%    Intake/Output Summary (Last 24 hours) at 06/20/2020 0808 Last data filed at 06/19/2020 1200 Gross per 24 hour  Intake 180 ml  Output --  Net 180 ml   Filed Weights   06/18/20 0503 06/19/20 0514 06/20/20 0438  Weight: 81.5 kg 81.3 kg 82.1 kg    Exam: General: Awake, calm Pulmonary:  Lungs are clear, stable on room air Cardiac: S1 S2, no tachycardia, extremities warm to touch Abdomen: Bowel sounds present.  Abdomen nontender.  Eating and drinking well.  LBM 2/22 Neurological:  Cranial nerves 2-12 intact, no focal neurological deficits and ambulates without difficulty Psychiatric: Awake with extremely flat affect and avoiding eye contact.  Will not answer orientation questions and essentially has chosen to be nonverbal.   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 During periods of alertness and lucidity patient denied use of methamphetamines prior to admission although did admit to abruptly stopping her methadone -Remains intermittently oriented with episodes of delusion and lack of insight into her current psychiatric status -Continue max dose Geodon 80 mg BID, naltrexone, scheduled Klonopin, Tegretol, Celexa and Remeron at hour of sleep -LFTs trending up so will decrease Tegretol to 400 mg TID -repeat LFTs in 2-3 weeks-2/24 total level 13.4 which is greater than 12 therefore need to new current dose of Tegretol **2/24 reevaluated by psych with the following recommendations: Continue IVC due to high risk of self-harm and others and safety precautions with safety sitter for unpredictable behavior Continue current antipsychotics with as needed dosing available for breakthrough agitation aggression and psychosis Continue current decreased dose of Tegretol-Tegretol level obtained with goal<12 Increase citalopram for management of behavioral disturbance Continue naltrexone Continue melatonin and trazodone for insomnia Consider tapering off or adjusting dosage of Klonopin if  medically appropriate- she is on high-dose and requested daily Most importantly, continue to recommend inpatient [facility] for long-term management of chronic psychiatric condition, unpredictable and erratic behaviors, worsening aggression and rapid cycling that results in dangerous behaviors and insult or injury to self and others  Severe insomnia -Continue Remeron and trazodone   History of polysubstance abuse with heroin/history of outpatient methadone treatment -Prescribed methadone 60 mg daily from a clinic in [employer] prior to admission -Continue  naltrexone to treat heroin addiction as well as recent issues with impulsivity and hypersexual behaviors during admission which are suspected related to brain injury and worsening psychiatric condition   Depression and severe anxiety disorder/PTSD/no bipolar disorder now with schizoaffective features -Extremes in behavior have significantly improved-continue IVC (last updated 2/8) -See above regarding pharmacotherapy  Recurrent constipation/nausea -Continue Colace 100 mg twice daily prn -Continue MiraLAX to twice daily - has had multiple bowel movements since initiation of scheduled lactulose-lactulose could be contributing to ongoing nausea therefore will change to prn -As a precaution with her history of recurrent UTI will check urinalysis and culture to ensure nausea not secondary to UTI    Other problems: Status epilepticus -Resolved -Suspect precipitated by abrupt withdrawal of methadone as reported by patient -Repeat MRI on 12/28 within normal limits and it is felt behavioral issues related to brain injury sequelae and chronic psychiatric condition.  Neurology has signed off -Vimpat, Dilantin and phenobarbital have been tapered and discontinued at recommendation of neurologist  Nonobstructive transaminitis in context of hepatitis C -Elevated HCV antibody with markedly elevated HCV RNA quantitative level  consistent with chronic hepatitis C  -LFTs remain stable without an obstructive pattern noted -11/10: Discussed with ID Dr. Manson Passey. Treatment of hepatitis C typically is initiated in the outpatient setting. Medications can be quite expensive and usually take time to obtain insurance approval.  -ID recommends scheduling outpatient appointment their clinic closer to discharge date  Back pain 2/2 abnormal urinalysis consistent with UTI -Patient with low-grade fever overnight and is now having back pain for days not responsive to NSAIDs and muscle relaxers -DG lumbar spine  unremarkable -During hospitalization patient has been treated for pansensitive E. coli UTI as well as Salmonella UTI both of which have been sensitive to Levaquin -Completed Levaquin 12/20-urine culture obtained after antibiotics initiated and showed no growth -CT abdomen and pelvis on 12/17 unremarkable  Salmonella UTI -Completed amoxicillin  Abnormal urinalysis -Not consistent with UTI, was positive for moderate hemoglobin and greater than 50 RBCs with an elevated specific gravity -Suspect dehydration -Patient denies back pain or history of renal calculi although given her confusion unclear if this history is accurate  Physical deconditioning -Resolved -Does continue to exhibit impulsivity but maintains appropriate balance and gait no longer requires one-to-one safety sitter  Large hemorrhoids. -Resolved -Markedly improved-LD Proctofoam 12/15  Dysphagia -Resolved -Continue regular diet   Data Reviewed: Basic Metabolic Panel: Recent Labs  Lab 06/16/20 0106  NA 133*  K 3.9  CL 100  CO2 24  GLUCOSE 130*  BUN 13  CREATININE 0.69  CALCIUM 8.6*   Liver Function Tests: Recent Labs  Lab 06/16/20 0106  AST 104*  ALT 103*  ALKPHOS 98  BILITOT 0.5  PROT 5.8*  ALBUMIN 3.3*   No results for input(s): LIPASE, AMYLASE in the last 168 hours. No results for input(s): AMMONIA in the last 168 hours. CBC: Recent Labs  Lab 06/16/20 0106  WBC 3.5*  HGB 12.3  HCT 36.6  MCV 93.1  PLT 246   Cardiac Enzymes: No results for input(s): CKTOTAL, CKMB, CKMBINDEX, TROPONINI in the last 168 hours. BNP (last 3 results) No results for input(s): BNP in the last 8760 hours.  ProBNP (last 3 results) No results for input(s): PROBNP in the last 8760 hours.  CBG: No results for input(s): GLUCAP in the last 168 hours.  No results found for this or any previous visit (from the past 240 hour(s)).   Studies: No results found.  Scheduled Meds: . carbamazepine  400 mg Oral TID   . citalopram  40 mg Oral Daily  . clonazePAM  1 mg Oral Q6H  . docusate sodium  100 mg Oral BID  . feeding supplement  237 mL Oral BID BM  . melatonin  10 mg Oral QHS  . mirtazapine  30 mg Oral QHS  . multivitamin with minerals  1 tablet Oral Daily  . naltrexone  25 mg Oral Daily  . polyethylene glycol  17 g Oral BID  . vitamin B-6  100 mg Oral Daily  . thiamine  100 mg Oral Daily  . traZODone  150 mg Oral QHS  . ziprasidone  80 mg Oral 2 times per day   Continuous Infusions:   Principal Problem:   Delirium due to multiple etiologies Active Problems:   Acute metabolic encephalopathy   Hypokalemia   Polysubstance abuse (HCC)   Elevated CK   Transaminitis   Refractory seizure (HCC)   Chronic hepatitis C without hepatic coma (HCC)   Distended abdomen   Palliative care encounter   Colonic Ileus (HCC)   Organic brain syndrome   On enteral nutrition   Physical deconditioning   Protein-calorie malnutrition (HCC)   Inadequate oral nutritional intake   Impulse disorder   Consultants:  Neurology  Psychiatry  Interventional radiology  Surgery    Procedures:  9/14 lumbar puncture  9/15 EEG  9/16 EEG  9/17 EEG  9/19 overnight EEG with video  9/22 overnight EEG with video with discontinuation of long-term EEG monitoring on 9/25  9/24 core track placement  10/6 EEG  12/15 PEG tube removed    Antibiotics: Anti-infectives (From admission, onward)   Start     Dose/Rate Route Frequency Ordered Stop   05/11/20 0845  amoxicillin-clavulanate (AUGMENTIN) 500-125 MG per tablet 500 mg        1 tablet Oral Every 8 hours 05/11/20 0750 05/18/20 0559   05/11/20 0845  metroNIDAZOLE (FLAGYL) tablet 500 mg        500 mg Oral Every 8 hours 05/11/20 0750 05/18/20 0559   04/05/20 1415  levofloxacin (LEVAQUIN) tablet 500 mg        500 mg Oral Daily 04/05/20 1327 04/11/20 0911   03/19/20 1000  metroNIDAZOLE (FLAGYL) tablet 500 mg  Status:  Discontinued        500 mg Per  Tube 2 times daily 03/19/20 0822 03/23/20 0827   03/18/20 1200  amoxicillin (AMOXIL) 250 MG/5ML suspension 500 mg  Status:  Discontinued        500 mg Per Tube Every 8 hours 03/18/20 0915 03/23/20 0559   03/17/20 1200  cefTRIAXone (ROCEPHIN) 1 g in sodium chloride 0.9 % 100 mL IVPB  Status:  Discontinued        1 g 200 mL/hr over 30 Minutes Intravenous Every 24 hours 03/17/20 1048 03/18/20 0913   03/16/20 1130  metroNIDAZOLE (FLAGYL) 50 mg/ml oral suspension 500 mg  Status:  Discontinued        500 mg  Per Tube 2 times daily 03/16/20 1040 03/19/20 0821   03/16/20 1130  fluconazole (DIFLUCAN) 40 MG/ML suspension 152 mg        150 mg Per Tube  Once 03/16/20 1040 03/16/20 1245   02/11/20 1526  ceFAZolin (ANCEF) IVPB 2g/100 mL premix        2 g 200 mL/hr over 30 Minutes Intravenous  Once 02/11/20 1526 02/11/20 1641   02/05/20 0600  ceFAZolin (ANCEF) IVPB 2g/100 mL premix        2 g 200 mL/hr over 30 Minutes Intravenous To Short Stay 02/04/20 1054 02/05/20 0950   01/29/20 0600  ceFAZolin (ANCEF) IVPB 2g/100 mL premix        2 g 200 mL/hr over 30 Minutes Intravenous On call to O.R. 01/28/20 1511 01/30/20 0559   01/28/20 2000  cefTRIAXone (ROCEPHIN) 1 g in sodium chloride 0.9 % 100 mL IVPB        1 g 200 mL/hr over 30 Minutes Intravenous Every 24 hours 01/28/20 1925 02/02/20 0724   01/06/20 0900  cefTRIAXone (ROCEPHIN) 2 g in sodium chloride 0.9 % 100 mL IVPB  Status:  Discontinued        2 g 200 mL/hr over 30 Minutes Intravenous Every 12 hours 01/06/20 0105 01/06/20 2202   01/06/20 0800  vancomycin (VANCOCIN) IVPB 1000 mg/200 mL premix  Status:  Discontinued       "Followed by" Linked Group Details   1,000 mg 200 mL/hr over 60 Minutes Intravenous Every 12 hours 01/05/20 1904 01/06/20 2202   01/05/20 2000  vancomycin (VANCOREADY) IVPB 1500 mg/300 mL       "Followed by" Linked Group Details   1,500 mg 150 mL/hr over 120 Minutes Intravenous  Once 01/05/20 1904 01/06/20 0127   01/05/20 1830   cefTRIAXone (ROCEPHIN) 2 g in sodium chloride 0.9 % 100 mL IVPB        2 g 200 mL/hr over 30 Minutes Intravenous  Once 01/05/20 1829 01/05/20 2220       Time spent: 20 minutes    Junious SilkAllison Charmion Hapke ANP  [facility] 7 am - 330 pm/M-F for direct patient care and secure chat Please see Amion for contact information 166 days

## 2020-06-20 NOTE — Progress Notes (Addendum)
CSW received return email from [facility] stating there is no appointment availability until this fall.   Plan remains for inpatient admission to [facility] once a bed becomes available - CSW is exploring outpatient services for patient after [facility]. Patient is still on [facility] priority waiting list and is waiting for a bed.  Edwin Dada, MSW, LCSW Transitions of Care  Clinical Social Worker II [phone]

## 2020-06-20 NOTE — Plan of Care (Signed)
Patient slept throughout the night. VSS. No c/o. Sitter at bedside. Call bell within reach.   Problem: Education: Goal: Ability to state activities that reduce stress will improve Outcome: Progressing   Problem: Coping: Goal: Ability to identify and develop effective coping behavior will improve Outcome: Progressing   Problem: Self-Concept: Goal: Ability to identify factors that promote anxiety will improve Outcome: Progressing Goal: Level of anxiety will decrease Outcome: Progressing Goal: Ability to modify response to factors that promote anxiety will improve Outcome: Progressing   Problem: Role Relationship: Goal: Will demonstrate positive changes in social behaviors and relationships Outcome: Progressing

## 2020-06-21 LAB — COMPREHENSIVE METABOLIC PANEL
ALT: 152 U/L — ABNORMAL HIGH (ref 0–44)
AST: 168 U/L — ABNORMAL HIGH (ref 15–41)
Albumin: 3.2 g/dL — ABNORMAL LOW (ref 3.5–5.0)
Alkaline Phosphatase: 91 U/L (ref 38–126)
Anion gap: 11 (ref 5–15)
BUN: 14 mg/dL (ref 6–20)
CO2: 26 mmol/L (ref 22–32)
Calcium: 8.9 mg/dL (ref 8.9–10.3)
Chloride: 100 mmol/L (ref 98–111)
Creatinine, Ser: 0.76 mg/dL (ref 0.44–1.00)
GFR, Estimated: >60 mL/min (ref >60)
Glucose, Bld: 182 mg/dL — ABNORMAL HIGH (ref 70–99)
Potassium: 3.9 mmol/L (ref 3.5–5.1)
Sodium: 137 mmol/L (ref 135–145)
Total Bilirubin: 1.5 mg/dL — ABNORMAL HIGH (ref 0.3–1.2)
Total Protein: 5.9 g/dL — ABNORMAL LOW (ref 6.5–8.1)

## 2020-06-21 LAB — CBC WITH DIFFERENTIAL/PLATELET
Abs Immature Granulocytes: 0 10*3/uL (ref 0.00–0.07)
Basophils Absolute: 0 10*3/uL (ref 0.0–0.1)
Basophils Relative: 1 %
Eosinophils Absolute: 0.1 10*3/uL (ref 0.0–0.5)
Eosinophils Relative: 3 %
HCT: 37.1 % (ref 36.0–46.0)
Hemoglobin: 12.9 g/dL (ref 12.0–15.0)
Immature Granulocytes: 0 %
Lymphocytes Relative: 49 %
Lymphs Abs: 1.4 10*3/uL (ref 0.7–4.0)
MCH: 32.2 pg (ref 26.0–34.0)
MCHC: 34.8 g/dL (ref 30.0–36.0)
MCV: 92.5 fL (ref 80.0–100.0)
Monocytes Absolute: 0.3 10*3/uL (ref 0.1–1.0)
Monocytes Relative: 11 %
Neutro Abs: 1 10*3/uL — ABNORMAL LOW (ref 1.7–7.7)
Neutrophils Relative %: 36 %
Platelets: 278 10*3/uL (ref 150–400)
RBC: 4.01 MIL/uL (ref 3.87–5.11)
RDW: 12.6 % (ref 11.5–15.5)
WBC: 2.7 10*3/uL — ABNORMAL LOW (ref 4.0–10.5)
nRBC: 0 % (ref 0.0–0.2)

## 2020-06-21 MED ORDER — METHOCARBAMOL 500 MG PO TABS
500.0000 mg | ORAL_TABLET | Freq: Four times a day (QID) | ORAL | Status: DC | PRN
  Administered 2020-07-02: 500 mg via ORAL
  Filled 2020-06-21 (×2): qty 1

## 2020-06-21 MED ORDER — IBUPROFEN 600 MG PO TABS
600.0000 mg | ORAL_TABLET | Freq: Four times a day (QID) | ORAL | Status: DC | PRN
  Administered 2020-06-21 – 2020-07-02 (×9): 600 mg via ORAL
  Filled 2020-06-21 (×9): qty 1

## 2020-06-21 NOTE — Progress Notes (Addendum)
[facility] PROGRESS NOTE  Anita Davis JXB:[id number] DOB: 1985/08/20 DOA: 01/04/2020 PCP: Jacquelin HawkingMcElroy, Shannon, PA-C    02/10/20                      02/15/20                       04/08/20    04/01/21  Status: Remains inpatient appropriate because:Altered mental status, Unsafe d/c plan and Inpatient level of care appropriate due to severity of illness   Dispo: The patient is from: Home              [facility]              Anticipated d/c date is: > 3 days              Patient currently is medically stable to d/c.  Barriers to discharge: Significant aggressive and violent outbursts have improved w/ no episodes sine 1/24. Remains on priority list for [facility].  Her father and stepmother now have guardianship of this patient and are planning to bring a copy of this documentation to the hospital. Next IVC due 3/1  On 2/24 have asked psych to reevaluate this patient to determine if she still requires [facility] or if she would be appropriate to discharge to the home environment with appropriate psychiatric follow-up.  Of note she still does not have Medicaid so there may be some difficulty in obtaining medications.  In the interim it is determined that patient can discharge to home we have reached out to [facility] to determine if she would be an appropriate patient for their clinic.  She definitely needs to be assigned a psychiatrist for medication management and likely needs to be enrolled in some sort of day program.     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  Patient [facility] [place] prior to [place].  Initial presentation was related to symptoms from acute cystitis.  Two other presentations related to insomnia.  On those occasions she was able to be discharged from the ER.  She returned again on 13 September being brought in by family for altered mental status.  After admission this patient developed seizure activity which progressed to status epilepticus therefore she was transferred to [facility] for further neurological evaluation.  After several days recurrent seizures were aborted with combination of 3 antiepileptic drugs.  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  Fortunately this did not improve patient's mentation and she continued to have waxing and waning episodes of alertness and encephalopathy.  Since admission she has not had any further seizures so these medications were discontinued.  She has eventually awakened behaviors alternating between awake and appropriate, awake crying and depressed occasionally delusional, severe agitation and aggression with significant delusions.  She has been evaluated both  by neurology and psychiatry who suspect a combination of sequelae from underlying brain injury as well as bipolar disorder with schizoaffective features.  During one of her alert phases patient did admit to being diagnosed with bipolar disease prior to admission.  Patient is on multiple psychotropic medications continued to have episodes of violent outbursts.  She had required frequent doses of IM Geodon as well as Recruitment consultant and frequent intervention by security staff during her episodes of severe agitation.   Recent chart review by [facility] states patient would not be a candidate for ECT given recent brain injury sequela as well as presentation with status epilepticus.  Has been stable regarding extreme agitation and/or violent behaviors since 1/24 when she had received her last Geodon IM dose.  On the evening of 2/4 she was also given a dose of IM Geodon.  There was no chart documentation stating what type of behavior she was experiencing but typically this medication is only given if she has extreme agitation.  Subjective: Patient awake but extremely withdrawn.  Remains nonverbal even when directly questioned.  Objective: Vitals:   06/20/20 2200 06/21/20 0529  BP: 102/86 110/77  Pulse: 60 62  Resp: 18 18  Temp: 98 F (36.7 C) 98 F (36.7 C)  SpO2: 98% 98%    Intake/Output Summary (Last 24 hours) at 06/21/2020 0809 Last data filed at 06/20/2020 2100 Gross per 24 hour  Intake 250 ml  Output --  Net 250 ml   Filed Weights   06/19/20 0514 06/20/20 0438 06/21/20 0529  Weight: 81.3 kg 82.1 kg 82.2 kg    Exam: General: Awake but withdrawn, no acute physical distress Pulmonary:  Posterior lung sounds are clear, she is stable on room air Cardiac: Normal heart sounds, no peripheral edema, no tachycardia Abdomen: Soft, nondistended with normoactive bowel sounds.  Apparently eating well.  LBM 2/25 Neurological:  Cranial nerves 2-12 intact, no focal neurological deficits and ambulates without difficulty Psychiatric: Awake but extremely withdrawn.  Will not engage in conversation when spoken to.  Because she is not replying to questions asked unable to assess orientation adequately.  She did respond appropriately when addressed by her first name.   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 During periods of alertness and lucidity patient denied use of methamphetamines prior to admission although did admit to abruptly stopping her methadone -Remains intermittently oriented with episodes of delusion and lack of insight into her current psychiatric status -Continue max dose Geodon 80 mg BID, naltrexone, scheduled Klonopin, Tegretol, Celexa and Remeron at hour of sleep -LFTs trending up so will decrease Tegretol to 400 mg TID -repeat LFTs in 2-3 weeks-2/24 total level 13.4 which is greater than 12 therefore need to new current dose of Tegretol **2/24 reevaluated by psych with the following recommendations: Continue IVC due to high risk of self-harm and others and safety precautions with safety sitter for unpredictable behavior Continue current antipsychotics with as needed dosing available for breakthrough agitation aggression and psychosis Continue current decreased dose of Tegretol-Tegretol level obtained with goal<12 Increase citalopram for management of behavioral disturbance Continue naltrexone Continue melatonin and trazodone for insomnia Consider  tapering off or adjusting dosage of Klonopin if medically appropriate- she is on high-dose and requested daily Most importantly, continue to recommend inpatient [facility] state [facility] for long-term management of chronic psychiatric condition, unpredictable and erratic behaviors, worsening aggression and rapid cycling that results in dangerous behaviors and insult or injury to self and others  Severe insomnia -Continue Remeron and trazodone   Recurrent back pain -Initiate ibuprofen and Robaxin oral as needed  History of polysubstance abuse with  heroin/history of outpatient methadone treatment -Prescribed methadone 60 mg daily from a clinic in [place] prior to admission -Continue naltrexone to treat heroin addiction as well as recent issues with impulsivity and hypersexual behaviors during admission which are suspected related to brain injury and worsening psychiatric condition   Depression and severe anxiety disorder/PTSD/no bipolar disorder now with schizoaffective features -Extremes in behavior have significantly improved-continue IVC (last updated 2/8) -See above regarding pharmacotherapy  Recurrent constipation/nausea -Continue Colace 100 mg twice daily prn -Continue MiraLAX to twice daily - has had multiple bowel movements since initiation of scheduled lactulose-lactulose could be contributing to ongoing nausea therefore will change to prn -As a precaution with her history of recurrent UTI will check urinalysis and culture to ensure nausea not secondary to UTI    Other problems: Status epilepticus -Resolved -Suspect precipitated by abrupt withdrawal of methadone as reported by patient -Repeat MRI on 12/28 within normal limits and it is felt behavioral issues related to brain injury sequelae and chronic psychiatric condition.  Neurology has signed off -Vimpat, Dilantin and phenobarbital have been tapered and discontinued at recommendation of neurologist  Nonobstructive transaminitis in context of hepatitis C -Elevated HCV antibody with markedly elevated HCV RNA quantitative level  consistent with chronic hepatitis C  -LFTs remain stable without an obstructive pattern noted -11/10: Discussed with ID Dr. Manson Passey. Treatment of hepatitis C typically is initiated in the outpatient setting. Medications can be quite expensive and usually take time to obtain insurance approval.  -ID recommends scheduling outpatient appointment their clinic closer to discharge date  Back pain 2/2 abnormal urinalysis consistent with  UTI -Patient with low-grade fever overnight and is now having back pain for days not responsive to NSAIDs and muscle relaxers -DG lumbar spine unremarkable -During hospitalization patient has been treated for pansensitive E. coli UTI as well as Salmonella UTI both of which have been sensitive to Levaquin -Completed Levaquin 12/20-urine culture obtained after antibiotics initiated and showed no growth -CT abdomen and pelvis on 12/17 unremarkable  Salmonella UTI -Completed amoxicillin  Abnormal urinalysis -Not consistent with UTI, was positive for moderate hemoglobin and greater than 50 RBCs with an elevated specific gravity -Suspect dehydration -Patient denies back pain or history of renal calculi although given her confusion unclear if this history is accurate  Physical deconditioning -Resolved -Does continue to exhibit impulsivity but maintains appropriate balance and gait no longer requires one-to-one safety sitter  Large hemorrhoids. -Resolved -Markedly improved-LD Proctofoam 12/15  Dysphagia -Resolved -Continue regular diet   Data Reviewed: Basic Metabolic Panel: Recent Labs  Lab 06/16/20 0106  NA 133*  K 3.9  CL 100  CO2 24  GLUCOSE 130*  BUN 13  CREATININE 0.69  CALCIUM 8.6*   Liver Function Tests: Recent Labs  Lab 06/16/20 0106  AST 104*  ALT 103*  ALKPHOS 98  BILITOT 0.5  PROT 5.8*  ALBUMIN 3.3*   No results for input(s): LIPASE, AMYLASE in the last 168 hours. No results for input(s): AMMONIA in the last 168 hours. CBC:  Recent Labs  Lab 06/16/20 0106  WBC 3.5*  HGB 12.3  HCT 36.6  MCV 93.1  PLT 246   Cardiac Enzymes: No results for input(s): CKTOTAL, CKMB, CKMBINDEX, TROPONINI in the last 168 hours. BNP (last 3 results) No results for input(s): BNP in the last 8760 hours.  ProBNP (last 3 results) No results for input(s): PROBNP in the last 8760 hours.  CBG: No results for input(s): GLUCAP in the last 168 hours.  No results found  for this or any previous visit (from the past 240 hour(s)).   Studies: No results found.  Scheduled Meds:  carbamazepine  400 mg Oral TID   citalopram  40 mg Oral Daily   clonazePAM  1 mg Oral Q6H   docusate sodium  100 mg Oral BID   feeding supplement  237 mL Oral BID BM   melatonin  10 mg Oral QHS   mirtazapine  30 mg Oral QHS   multivitamin with minerals  1 tablet Oral Daily   naltrexone  25 mg Oral Daily   polyethylene glycol  17 g Oral BID   vitamin B-6  100 mg Oral Daily   thiamine  100 mg Oral Daily   traZODone  150 mg Oral QHS   ziprasidone  80 mg Oral 2 times per day   Continuous Infusions:   Principal Problem:   Delirium due to multiple etiologies Active Problems:   Acute metabolic encephalopathy   Hypokalemia   Polysubstance abuse (HCC)   Elevated CK   Transaminitis   Refractory seizure (HCC)   Chronic hepatitis C without hepatic coma (HCC)   Distended abdomen   Palliative care encounter   Colonic Ileus (HCC)   Organic brain syndrome   On enteral nutrition   Physical deconditioning   Protein-calorie malnutrition (HCC)   Inadequate oral nutritional intake   Impulse disorder   Consultants:  Neurology  Psychiatry  Interventional radiology  Surgery    Procedures:  9/14 lumbar puncture  9/15 EEG  9/16 EEG  9/17 EEG  9/19 overnight EEG with video  9/22 overnight EEG with video with discontinuation of long-term EEG monitoring on 9/25  9/24 core track placement  10/6 EEG  12/15 PEG tube removed    Antibiotics: Anti-infectives (From admission, onward)   Start     Dose/Rate Route Frequency Ordered Stop   05/11/20 0845  amoxicillin-clavulanate (AUGMENTIN) 500-125 MG per tablet 500 mg        1 tablet Oral Every 8 hours 05/11/20 0750 05/18/20 0559   05/11/20 0845  metroNIDAZOLE (FLAGYL) tablet 500 mg        500 mg Oral Every 8 hours 05/11/20 0750 05/18/20 0559   04/05/20 1415  levofloxacin (LEVAQUIN) tablet 500 mg         500 mg Oral Daily 04/05/20 1327 04/11/20 0911   03/19/20 1000  metroNIDAZOLE (FLAGYL) tablet 500 mg  Status:  Discontinued        500 mg Per Tube 2 times daily 03/19/20 0822 03/23/20 0827   03/18/20 1200  amoxicillin (AMOXIL) 250 MG/5ML suspension 500 mg  Status:  Discontinued        500 mg Per Tube Every 8 hours 03/18/20 0915 03/23/20 0559   03/17/20 1200  cefTRIAXone (ROCEPHIN) 1 g in sodium chloride 0.9 % 100 mL IVPB  Status:  Discontinued        1 g 200 mL/hr over 30 Minutes Intravenous Every 24 hours 03/17/20 1048 03/18/20 0913   03/16/20 1130  metroNIDAZOLE (FLAGYL)  50 mg/ml oral suspension 500 mg  Status:  Discontinued        500 mg Per Tube 2 times daily 03/16/20 1040 03/19/20 0821   03/16/20 1130  fluconazole (DIFLUCAN) 40 MG/ML suspension 152 mg        150 mg Per Tube  Once 03/16/20 1040 03/16/20 1245   02/11/20 1526  ceFAZolin (ANCEF) IVPB 2g/100 mL premix        2 g 200 mL/hr over 30 Minutes Intravenous  Once 02/11/20 1526 02/11/20 1641   02/05/20 0600  ceFAZolin (ANCEF) IVPB 2g/100 mL premix        2 g 200 mL/hr over 30 Minutes Intravenous To Short Stay 02/04/20 1054 02/05/20 0950   01/29/20 0600  ceFAZolin (ANCEF) IVPB 2g/100 mL premix        2 g 200 mL/hr over 30 Minutes Intravenous On call to O.R. 01/28/20 1511 01/30/20 0559   01/28/20 2000  cefTRIAXone (ROCEPHIN) 1 g in sodium chloride 0.9 % 100 mL IVPB        1 g 200 mL/hr over 30 Minutes Intravenous Every 24 hours 01/28/20 1925 02/02/20 0724   01/06/20 0900  cefTRIAXone (ROCEPHIN) 2 g in sodium chloride 0.9 % 100 mL IVPB  Status:  Discontinued        2 g 200 mL/hr over 30 Minutes Intravenous Every 12 hours 01/06/20 0105 01/06/20 2202   01/06/20 0800  vancomycin (VANCOCIN) IVPB 1000 mg/200 mL premix  Status:  Discontinued       "Followed by" Linked Group Details   1,000 mg 200 mL/hr over 60 Minutes Intravenous Every 12 hours 01/05/20 1904 01/06/20 2202   01/05/20 2000  vancomycin (VANCOREADY) IVPB 1500 mg/300 mL        "Followed by" Linked Group Details   1,500 mg 150 mL/hr over 120 Minutes Intravenous  Once 01/05/20 1904 01/06/20 0127   01/05/20 1830  cefTRIAXone (ROCEPHIN) 2 g in sodium chloride 0.9 % 100 mL IVPB        2 g 200 mL/hr over 30 Minutes Intravenous  Once 01/05/20 1829 01/05/20 2220       Time spent: 20 minutes    [name] ANP  [facility] 7 am - 330 pm/M-F for direct patient care and secure chat Please see Amion for contact information 167 days

## 2020-06-21 NOTE — Progress Notes (Signed)
Pt walked around the hallway, once. Pt grabbed a newspaper, said hello to everyone and walked back into her room. Pt remained calm and cooperative. Will continue to monitor pt.

## 2020-06-21 NOTE — Progress Notes (Signed)
Walked back out to the hallway in hopes for some batteries for her small water fountain. Pt voiced hopes of signing herself out, but this writer explained pt that her IVC papers were renewed. Pt verbally stated they understood. Pt remained cooperative and calm. Pt back in her room. Will continue to monitor pt.

## 2020-06-21 NOTE — Progress Notes (Addendum)
CSW spoke with admissions at [facility] - no beds available at this time.  CSW renewed patient's IVC.  Edwin Dada, MSW, LCSW Transitions of Care  Clinical Social Worker II [phone]

## 2020-06-21 NOTE — Plan of Care (Signed)
Problem: Education: Goal: Knowledge of General Education information will improve Description: Including pain rating scale, medication(s)/side effects and non-pharmacologic comfort measures Outcome: Progressing   Problem: Clinical Measurements: Goal: Ability to maintain clinical measurements within normal limits will improve Outcome: Progressing Goal: Will remain free from infection Outcome: Progressing Goal: Diagnostic test results will improve Outcome: Progressing Goal: Respiratory complications will improve Outcome: Progressing Goal: Cardiovascular complication will be avoided Outcome: Progressing   Problem: Activity: Goal: Risk for activity intolerance will decrease Outcome: Progressing   Problem: Nutrition: Goal: Adequate nutrition will be maintained Outcome: Progressing   Problem: Coping: Goal: Level of anxiety will decrease Outcome: Progressing   Problem: Elimination: Goal: Will not experience complications related to bowel motility Outcome: Progressing Goal: Will not experience complications related to urinary retention Outcome: Progressing   Problem: Pain Managment: Goal: General experience of comfort will improve Outcome: Progressing   Problem: Safety: Goal: Ability to remain free from injury will improve Outcome: Progressing   Problem: Skin Integrity: Goal: Risk for impaired skin integrity will decrease Outcome: Progressing   Problem: Education: Goal: Expressions of having a comfortable level of knowledge regarding the disease process will increase Outcome: Progressing   Problem: Coping: Goal: Ability to adjust to condition or change in health will improve Outcome: Progressing Goal: Ability to identify appropriate support needs will improve Outcome: Progressing   Problem: Health Behavior/Discharge Planning: Goal: Compliance with prescribed medication regimen will improve Outcome: Progressing   Problem: Medication: Goal: Risk for medication  side effects will decrease Outcome: Progressing   Problem: Clinical Measurements: Goal: Complications related to the disease process, condition or treatment will be avoided or minimized Outcome: Progressing Goal: Diagnostic test results will improve Outcome: Progressing   Problem: Safety: Goal: Verbalization of understanding the information provided will improve Outcome: Progressing   Problem: Self-Concept: Goal: Level of anxiety will decrease Outcome: Progressing Goal: Ability to verbalize feelings about condition will improve Outcome: Progressing   Problem: Education: Goal: Ability to state activities that reduce stress will improve Outcome: Progressing   Problem: Coping: Goal: Ability to identify and develop effective coping behavior will improve Outcome: Progressing   Problem: Self-Concept: Goal: Ability to identify factors that promote anxiety will improve Outcome: Progressing Goal: Level of anxiety will decrease Outcome: Progressing Goal: Ability to modify response to factors that promote anxiety will improve Outcome: Progressing   Problem: Education: Goal: Utilization of techniques to improve thought processes will improve Outcome: Progressing Goal: Knowledge of the prescribed therapeutic regimen will improve Outcome: Progressing   Problem: Activity: Goal: Interest or engagement in leisure activities will improve Outcome: Progressing Goal: Imbalance in normal sleep/wake cycle will improve Outcome: Progressing   Problem: Coping: Goal: Coping ability will improve Outcome: Progressing Goal: Will verbalize feelings Outcome: Progressing   Problem: Health Behavior/Discharge Planning: Goal: Ability to make decisions will improve Outcome: Progressing Goal: Compliance with therapeutic regimen will improve Outcome: Progressing   Problem: Role Relationship: Goal: Will demonstrate positive changes in social behaviors and relationships Outcome: Progressing    Problem: Safety: Goal: Ability to disclose and discuss suicidal ideas will improve Outcome: Progressing Goal: Ability to identify and utilize support systems that promote safety will improve Outcome: Progressing   Problem: Self-Concept: Goal: Will verbalize positive feelings about self Outcome: Progressing Goal: Level of anxiety will decrease Outcome: Progressing   Problem: Education: Goal: Verbalization of understanding the information provided will improve Outcome: Progressing   Problem: Coping: Goal: Ability to demonstrate appropriate behavior when angry will improve Outcome: Progressing Goal: Ability to identify and develop effective  coping behavior will improve Outcome: Progressing Goal: Ability to interact with others will improve Outcome: Progressing   Problem: Health Behavior/Discharge Planning: Goal: Identification of resources available to assist in meeting health care needs will improve Outcome: Progressing

## 2020-06-22 ENCOUNTER — Inpatient Hospital Stay (HOSPITAL_COMMUNITY): Payer: Medicaid Other

## 2020-06-22 MED ORDER — CARBAMAZEPINE 200 MG PO TABS
200.0000 mg | ORAL_TABLET | Freq: Three times a day (TID) | ORAL | Status: DC
  Administered 2020-06-22 – 2020-06-28 (×19): 200 mg via ORAL
  Filled 2020-06-22 (×21): qty 1

## 2020-06-22 MED ORDER — LORATADINE 10 MG PO TABS
10.0000 mg | ORAL_TABLET | Freq: Every day | ORAL | Status: DC | PRN
  Administered 2020-06-22 – 2020-06-24 (×2): 10 mg via ORAL
  Filled 2020-06-22 (×2): qty 1

## 2020-06-22 NOTE — Progress Notes (Signed)
[facility] PROGRESS NOTE  Anita Davis ZOX:[id number] DOB: Jul 06, 1985 DOA: 01/04/2020 PCP: Jacquelin HawkingMcElroy, Shannon, PA-C    02/10/20                      02/15/20                       04/08/20    04/01/21  Status: Remains inpatient appropriate because:Altered mental status, Unsafe d/c plan and Inpatient level of care appropriate due to severity of illness   Dispo: The patient is from: Home              [facility]              Anticipated d/c date is: > 3 days              Patient currently is medically stable to d/c.  Barriers to discharge: Significant aggressive and violent outbursts have improved w/ no episodes sine 1/24. Remains on priority list for [facility].  Her father and stepmother now have guardianship of this patient and are planning to bring a copy of this documentation to the hospital. Next IVC due 3/1  On 2/24 have asked psych to reevaluate this patient to determine if she still requires [facility] or if she would be appropriate to discharge to the home environment with appropriate psychiatric follow-up.  Of note she still does not have Medicaid so there may be some difficulty in obtaining medications.  In the interim it is determined that patient can discharge to home we have [facility] to determine if she would be an appropriate patient for their clinic.  She definitely needs to be assigned a psychiatrist for medication management and likely needs to be enrolled in some sort of day program.     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  Patient presented to [facility] in [place] several times prior to admission.  Initial presentation was related to symptoms from acute cystitis.  Two other presentations related to insomnia.  On those occasions she was able to be discharged from the ER.  She returned again on 13 September being brought in by family for altered mental status.  After admission this patient developed seizure activity which progressed to status epilepticus therefore she was [facility] neurological evaluation.  After several days recurrent seizures were aborted with combination of 3 antiepileptic drugs.  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  Fortunately this did not improve patient's mentation and she continued to have waxing and waning episodes of alertness and encephalopathy.  Since admission she has not had any further seizures so these medications were discontinued.  She has eventually awakened behaviors alternating between awake and appropriate, awake crying and depressed occasionally delusional, severe agitation and aggression with significant delusions.  She has been evaluated both  by neurology and psychiatry who suspect a combination of sequelae from underlying brain injury as well as bipolar disorder with schizoaffective features.  During one of her alert phases patient did admit to being diagnosed with bipolar disease prior to admission.  Patient is on multiple psychotropic medications continued to have episodes of violent outbursts.  She had required frequent doses of IM Geodon as well as Recruitment consultant and frequent intervention by security staff during her episodes of severe agitation.   Recent chart review by [facility] states patient would not be a candidate for ECT given recent brain injury sequela as well as presentation with status epilepticus.  Has been stable regarding extreme agitation and/or violent behaviors since 1/24 when she had received her last Geodon IM dose.  On the evening of 2/4 she was also given a dose of IM Geodon.  There was no chart documentation stating what type of behavior she was experiencing but typically this medication is only given if she has extreme agitation.  Subjective: Awake.  Sitting up in bed with legs crossed.  Continues with bland affect.  Did verbally engaged during conversation.  Asked her if she has had any stomach discomfort or nausea with eating she stated both.  Pointed to the supraumbilical region as area where she hurts after eating.  Denied emesis.  Denies current abdominal pain.  Explained to patient that need to obtain abdominal ultrasound to ensure that the worsening of her liver function studies not related to gallbladder disease especially with the symptoms she had been having.  Informed her that this is likely related to the Tegretol she has been taking and may require further adjustments in this medication.  Objective: Vitals:   06/20/20 2200 06/21/20 0529  BP: 102/86 110/77  Pulse: 60 62  Resp: 18 18  Temp: 98 F (36.7 C) 98 F (36.7 C)  SpO2: 98% 98%   No intake or output data in the 24 hours ending 06/22/20 0806 Filed Weights   06/19/20 0514 06/20/20 0438 06/21/20 0529  Weight: 81.3 kg 82.1 kg 82.2 kg    Exam: General: Awake, still with bland affect.  Will make eye contact today, calm otherwise Pulmonary:  Lung sounds clear to auscultation anteriorly, no increased work of breathing, stable on room air Cardiac: Normal heart sounds, pulse regular nontachycardic, extremities warm to touch Abdomen: Soft nontender including in the epigastrium and right upper quadrant.  Good appetite.  LBM 3/2 Neurological:  Cranial nerves  2-12 intact, no focal neurological deficits and ambulates without difficulty Psychiatric: Alert but continues with plan flat affect.  Oriented times name, place, not to year.  Did not express any delusional thoughts today.   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 During periods of alertness and lucidity patient denied use of methamphetamines prior to admission although did admit to abruptly stopping her methadone -Remains intermittently oriented with episodes of delusion and lack of insight into her current psychiatric status -Continue max dose Geodon 80 mg BID, naltrexone, scheduled Klonopin, Tegretol, Celexa and Remeron at hour of sleep -LFTs trending up so will decrease Tegretol to 400 mg TID -repeat LFTs in 2-3 weeks-2/24 total  level 13.4 which is greater than 12 -repeat carbamazepine level again on 3/3 -Repeat LFTs on 3/2 further worsening and now with elevated total bilirubin-see below regarding work-up **2/24 reevaluated by psych with the following recommendations: Continue IVC due to high risk of self-harm and others and safety precautions with safety sitter for unpredictable behavior Continue current antipsychotics with as needed dosing available for breakthrough agitation aggression and psychosis Continue current decreased dose of Tegretol-Tegretol level obtained with goal<12 Increase citalopram for management of behavioral disturbance Continue naltrexone Continue melatonin and trazodone for insomnia Consider  tapering off or adjusting dosage of Klonopin if medically appropriate- she is on high-dose and requested daily Most importantly, continue to recommend inpatient [facility] for long-term management of chronic psychiatric condition, unpredictable and erratic behaviors, worsening aggression and rapid cycling that results in dangerous behaviors and insult or injury to self and others  Worsening transaminitis in context of hepatitis C -Elevated HCV antibody with markedly elevated HCV RNA quantitative level  consistent with chronic hepatitis C  -11/10: Discussed with ID Dr. Manson Passey. Treatment of hepatitis C typically is initiated in the outpatient setting. Medications can be quite expensive and usually take time to obtain insurance approval.  -ID recommends scheduling outpatient appointment their clinic closer to discharge date -Have been following LFTs more closely with the initiation of Tegretol.  Tegretol level was slightly supratherapeutic and AST, ALT had increased prompting decrease in dosage from 800 mg to 400 mg.  As of 3/2 AST and ALT further increased and now total bilirubin has increased to 1.5 from normal.  Patient had been complaining of some GI symptoms on and off for several months which she confirmed today.   -Plan is to obtain abdominal ultrasound to rule out biliary etiology to transaminitis otherwise will consider secondary to Tegretol -Repeat LFTs again in a.m. 3/3  Severe insomnia -Continue Remeron and trazodone   Recurrent back pain -Initiate ibuprofen and Robaxin oral as needed  History of polysubstance abuse with heroin/history of outpatient methadone treatment -Prescribed methadone 60 mg daily from a clinic in [place] prior to admission -Continue naltrexone to treat heroin addiction as well as recent issues with impulsivity and hypersexual behaviors during admission which are suspected related to brain injury and worsening psychiatric  condition   Depression and severe anxiety disorder/PTSD/no bipolar disorder now with schizoaffective features -Extremes in behavior have significantly improved-continue IVC (last updated 2/8) -See above regarding pharmacotherapy  Recurrent constipation/nausea -Continue Colace 100 mg twice daily prn -Continue MiraLAX to twice daily - has had multiple bowel movements since initiation of scheduled lactulose-lactulose could be contributing to ongoing nausea therefore will change to prn -As a precaution with her history of recurrent UTI will check urinalysis and culture to ensure nausea not secondary to UTI    Other problems: Status epilepticus -Resolved -Suspect precipitated by abrupt withdrawal of methadone as reported by patient -Repeat MRI on 12/28 within normal limits and it is felt behavioral issues related to brain injury sequelae and chronic psychiatric condition.  Neurology has signed off -Vimpat, Dilantin and phenobarbital have been tapered and discontinued at recommendation of neurologist  Back pain 2/2 abnormal urinalysis consistent with UTI -Patient with low-grade fever overnight and is now having back pain for days not responsive to NSAIDs and muscle relaxers -DG lumbar spine unremarkable -During hospitalization patient has been treated for pansensitive E. coli UTI as well as Salmonella UTI both of which have been sensitive to Levaquin -Completed Levaquin 12/20-urine culture obtained after antibiotics initiated  and showed no growth -CT abdomen and pelvis on 12/17 unremarkable  Physical deconditioning -Resolved -Does continue to exhibit impulsivity but maintains appropriate balance and gait no longer requires one-to-one safety sitter  Large hemorrhoids. -Resolved -Markedly improved-LD Proctofoam 12/15  Dysphagia -Resolved -Continue regular diet  Back pain 2/2 abnormal urinalysis consistent with UTI/history of Salmonella and E. coli UTI during  hospitalization -Patient with low-grade fever overnight and is now having back pain for days not responsive to NSAIDs and muscle relaxers -DG lumbar spine unremarkable -During hospitalization patient has been treated for pansensitive E. coli UTI as well as Salmonella UTI both of which have been sensitive to Levaquin -Completed Levaquin 12/20-urine culture obtained after antibiotics initiated and showed no growth -CT abdomen and pelvis on 12/17 unremarkable  Data Reviewed: Basic Metabolic Panel: Recent Labs  Lab 06/16/20 0106 06/21/20 0853  NA 133* 137  K 3.9 3.9  CL 100 100  CO2 24 26  GLUCOSE 130* 182*  BUN 13 14  CREATININE 0.69 0.76  CALCIUM 8.6* 8.9   Liver Function Tests: Recent Labs  Lab 06/16/20 0106 06/21/20 0853  AST 104* 168*  ALT 103* 152*  ALKPHOS 98 91  BILITOT 0.5 1.5*  PROT 5.8* 5.9*  ALBUMIN 3.3* 3.2*   No results for input(s): LIPASE, AMYLASE in the last 168 hours. No results for input(s): AMMONIA in the last 168 hours. CBC: Recent Labs  Lab 06/16/20 0106 06/21/20 0853  WBC 3.5* 2.7*  NEUTROABS  --  1.0*  HGB 12.3 12.9  HCT 36.6 37.1  MCV 93.1 92.5  PLT 246 278   Cardiac Enzymes: No results for input(s): CKTOTAL, CKMB, CKMBINDEX, TROPONINI in the last 168 hours. BNP (last 3 results) No results for input(s): BNP in the last 8760 hours.  ProBNP (last 3 results) No results for input(s): PROBNP in the last 8760 hours.  CBG: No results for input(s): GLUCAP in the last 168 hours.  No results found for this or any previous visit (from the past 240 hour(s)).   Studies: No results found.  Scheduled Meds: . carbamazepine  400 mg Oral TID  . citalopram  40 mg Oral Daily  . clonazePAM  1 mg Oral Q6H  . docusate sodium  100 mg Oral BID  . feeding supplement  237 mL Oral BID BM  . melatonin  10 mg Oral QHS  . mirtazapine  30 mg Oral QHS  . multivitamin with minerals  1 tablet Oral Daily  . naltrexone  25 mg Oral Daily  . polyethylene glycol   17 g Oral BID  . vitamin B-6  100 mg Oral Daily  . thiamine  100 mg Oral Daily  . traZODone  150 mg Oral QHS  . ziprasidone  80 mg Oral 2 times per day   Continuous Infusions:   Principal Problem:   Delirium due to multiple etiologies Active Problems:   Acute metabolic encephalopathy   Hypokalemia   Polysubstance abuse (HCC)   Elevated CK   Transaminitis   Refractory seizure (HCC)   Chronic hepatitis C without hepatic coma (HCC)   Distended abdomen   Palliative care encounter   Colonic Ileus (HCC)   Organic brain syndrome   On enteral nutrition   Physical deconditioning   Protein-calorie malnutrition (HCC)   Inadequate oral nutritional intake   Impulse disorder   Consultants:  Neurology  Psychiatry  Interventional radiology  Surgery    Procedures:  9/14 lumbar puncture  9/15 EEG  9/16 EEG  9/17 EEG  9/19 overnight EEG with video  9/22 overnight EEG with video with discontinuation of long-term EEG monitoring on 9/25  9/24 core track placement  10/6 EEG  12/15 PEG tube removed    Antibiotics: Anti-infectives (From admission, onward)   Start     Dose/Rate Route Frequency Ordered Stop   05/11/20 0845  amoxicillin-clavulanate (AUGMENTIN) 500-125 MG per tablet 500 mg        1 tablet Oral Every 8 hours 05/11/20 0750 05/18/20 0559   05/11/20 0845  metroNIDAZOLE (FLAGYL) tablet 500 mg        500 mg Oral Every 8 hours 05/11/20 0750 05/18/20 0559   04/05/20 1415  levofloxacin (LEVAQUIN) tablet 500 mg        500 mg Oral Daily 04/05/20 1327 04/11/20 0911   03/19/20 1000  metroNIDAZOLE (FLAGYL) tablet 500 mg  Status:  Discontinued        500 mg Per Tube 2 times daily 03/19/20 0822 03/23/20 0827   03/18/20 1200  amoxicillin (AMOXIL) 250 MG/5ML suspension 500 mg  Status:  Discontinued        500 mg Per Tube Every 8 hours 03/18/20 0915 03/23/20 0559   03/17/20 1200  cefTRIAXone (ROCEPHIN) 1 g in sodium chloride 0.9 % 100 mL IVPB  Status:  Discontinued         1 g 200 mL/hr over 30 Minutes Intravenous Every 24 hours 03/17/20 1048 03/18/20 0913   03/16/20 1130  metroNIDAZOLE (FLAGYL) 50 mg/ml oral suspension 500 mg  Status:  Discontinued        500 mg Per Tube 2 times daily 03/16/20 1040 03/19/20 0821   03/16/20 1130  fluconazole (DIFLUCAN) 40 MG/ML suspension 152 mg        150 mg Per Tube  Once 03/16/20 1040 03/16/20 1245   02/11/20 1526  ceFAZolin (ANCEF) IVPB 2g/100 mL premix        2 g 200 mL/hr over 30 Minutes Intravenous  Once 02/11/20 1526 02/11/20 1641   02/05/20 0600  ceFAZolin (ANCEF) IVPB 2g/100 mL premix        2 g 200 mL/hr over 30 Minutes Intravenous To Short Stay 02/04/20 1054 02/05/20 0950   01/29/20 0600  ceFAZolin (ANCEF) IVPB 2g/100 mL premix        2 g 200 mL/hr over 30 Minutes Intravenous On call to O.R. 01/28/20 1511 01/30/20 0559   01/28/20 2000  cefTRIAXone (ROCEPHIN) 1 g in sodium chloride 0.9 % 100 mL IVPB        1 g 200 mL/hr over 30 Minutes Intravenous Every 24 hours 01/28/20 1925 02/02/20 0724   01/06/20 0900  cefTRIAXone (ROCEPHIN) 2 g in sodium chloride 0.9 % 100 mL IVPB  Status:  Discontinued        2 g 200 mL/hr over 30 Minutes Intravenous Every 12 hours 01/06/20 0105 01/06/20 2202   01/06/20 0800  vancomycin (VANCOCIN) IVPB 1000 mg/200 mL premix  Status:  Discontinued       "Followed by" Linked Group Details   1,000 mg 200 mL/hr over 60 Minutes Intravenous Every 12 hours 01/05/20 1904 01/06/20 2202   01/05/20 2000  vancomycin (VANCOREADY) IVPB 1500 mg/300 mL       "Followed by" Linked Group Details   1,500 mg 150 mL/hr over 120 Minutes Intravenous  Once 01/05/20 1904 01/06/20 0127   01/05/20 1830  cefTRIAXone (ROCEPHIN) 2 g in sodium chloride 0.9 % 100 mL IVPB        2 g 200 mL/hr over  30 Minutes Intravenous  Once 01/05/20 1829 01/05/20 2220       Time spent: 20 minutes    [name] ANP  [facility] 7 am - 330 pm/M-F for direct patient care and secure chat Please see Amion for  contact information 168 days

## 2020-06-23 LAB — COMPREHENSIVE METABOLIC PANEL
ALT: 164 U/L — ABNORMAL HIGH (ref 0–44)
AST: 194 U/L — ABNORMAL HIGH (ref 15–41)
Albumin: 3.4 g/dL — ABNORMAL LOW (ref 3.5–5.0)
Alkaline Phosphatase: 107 U/L (ref 38–126)
Anion gap: 8 (ref 5–15)
BUN: 14 mg/dL (ref 6–20)
CO2: 25 mmol/L (ref 22–32)
Calcium: 8.8 mg/dL — ABNORMAL LOW (ref 8.9–10.3)
Chloride: 106 mmol/L (ref 98–111)
Creatinine, Ser: 0.79 mg/dL (ref 0.44–1.00)
GFR, Estimated: >60 mL/min (ref >60)
Glucose, Bld: 127 mg/dL — ABNORMAL HIGH (ref 70–99)
Potassium: 4.4 mmol/L (ref 3.5–5.1)
Sodium: 139 mmol/L (ref 135–145)
Total Bilirubin: 1.2 mg/dL (ref 0.3–1.2)
Total Protein: 6.4 g/dL — ABNORMAL LOW (ref 6.5–8.1)

## 2020-06-23 LAB — CBC
HCT: 39 % (ref 36.0–46.0)
Hemoglobin: 13 g/dL (ref 12.0–15.0)
MCH: 31.6 pg (ref 26.0–34.0)
MCHC: 33.3 g/dL (ref 30.0–36.0)
MCV: 94.9 fL (ref 80.0–100.0)
Platelets: 262 10*3/uL (ref 150–400)
RBC: 4.11 MIL/uL (ref 3.87–5.11)
RDW: 13 % (ref 11.5–15.5)
WBC: 3.1 10*3/uL — ABNORMAL LOW (ref 4.0–10.5)
nRBC: 0 % (ref 0.0–0.2)

## 2020-06-23 MED ORDER — PANTOPRAZOLE SODIUM 40 MG PO TBEC
40.0000 mg | DELAYED_RELEASE_TABLET | Freq: Every day | ORAL | Status: DC
  Administered 2020-06-23 – 2020-07-02 (×10): 40 mg via ORAL
  Filled 2020-06-23 (×10): qty 1

## 2020-06-23 NOTE — Progress Notes (Signed)
Round on pt she listen to music but crying and when ask  What is wrong? No responds. GAVE clonazepam, pt tolerated well.

## 2020-06-23 NOTE — Progress Notes (Addendum)
[facility] PROGRESS NOTE  Tahirih Lair NOB:[id number] DOB: 1985/07/13 DOA: 01/04/2020 PCP: [name], PA-C    02/10/20                      02/15/20                       04/08/20    04/01/21  Status: Remains inpatient appropriate because:Altered mental status, Unsafe d/c plan and Inpatient level of care appropriate due to severity of illness   Dispo: The patient is from: Home              [facility]              Anticipated d/c date is: > 3 days              Patient currently is medically stable to d/c.  Barriers to discharge: Significant aggressive and violent outbursts have improved w/ no episodes sine 1/24. Remains on priority list for [facility].  Her father and stepmother now have guardianship of this patient and are planning to bring a copy of this documentation to the hospital. 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  Patient presented to [facility] ER in [place] several times prior to admission.  Initial presentation was related to symptoms from acute cystitis.  Two other presentations related to insomnia.  On those occasions she was able to be discharged from the ER.  She returned again on 13 September being brought in by family for altered mental status.  After admission this patient developed seizure activity which progressed to status epilepticus therefore she was transferred to [facility] for further neurological evaluation.  After several days recurrent seizures were aborted with combination of 3 antiepileptic drugs.  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  Fortunately this did not improve patient's mentation and she continued to have waxing and waning episodes of alertness and encephalopathy.  Since admission she has not had any further seizures so these medications were discontinued.  She has eventually awakened behaviors alternating between awake and appropriate, awake crying and depressed occasionally delusional, severe agitation and aggression with significant delusions.  She has been evaluated both by neurology and psychiatry who suspect a combination of sequelae from underlying brain injury as well as bipolar disorder with schizoaffective features.  During one of her alert phases patient did admit to being diagnosed with bipolar disease prior to admission.  Patient is on multiple psychotropic medications continued to have episodes of violent outbursts.  She had required frequent doses of IM Geodon as well as Recruitment consultant and frequent intervention by security staff during her episodes of severe agitation.  Recent chart review by [facility] states patient would not be a candidate for ECT given recent brain injury sequela as well as presentation with status epilepticus.  Has been stable regarding extreme agitation and/or violent behaviors since 1/24 when she had received her last Geodon IM dose.  On the evening of 2/4 she was also given a dose of IM Geodon.  There was no chart documentation stating what type of behavior she was experiencing but typically this medication is only given if she has extreme agitation.  Subjective: Awake and remains quite withdrawn with essentially no interaction.  After my assessment she later told the nurse that she had had some chest discomfort after vomiting.  Objective: Vitals:   06/21/20 0529 06/22/20 1336  BP: 110/77 105/74  Pulse: 62 (!) 55  Resp: 18 18  Temp: 98 F (36.7 C) 98 F  (36.7 C)  SpO2: 98% 98%    Intake/Output Summary (Last 24 hours) at 06/23/2020 [id number] Last data filed at 06/23/2020 [id number] Gross per 24 hour  Intake 480 ml  Output --  Net 480 ml   Filed Weights   06/20/20 0438 06/21/20 0529 06/23/20 0500  Weight: 82.1 kg 82.2 kg 83 kg    Exam: General: Awake but very withdrawn with limited interaction Pulmonary:  Lungs are clear and she is stable on room air Cardiac: Normal heart sounds, normotensive, extremities warm to touch Abdomen: Soft nontender, no evidence of ascites, variable oral and LBM 3/2 Neurological:  Cranial nerves 2-12 intact, no focal neurological deficits and ambulates without difficulty Psychiatric: Awake but extremely withdrawn with limited interaction and avoiding eye contact.  Oriented times name.   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 During periods of alertness and lucidity patient denied use of methamphetamines prior to admission although did admit to abruptly stopping her methadone -Remains intermittently oriented with episodes of delusion and lack of insight into her current psychiatric status -Continue max dose Geodon 80 mg BID, naltrexone, scheduled Klonopin, Tegretol, Celexa and Remeron at hour of sleep -LFTs trended up further as of 3/1.  Abdominal ultrasound negative.  Have further decreased Tegretol to 200 mg 3 times daily.  Notified psychiatric team of lab changes as well as changes in medications 2/24 total level 13.4 which is greater than 12  -repeat carbamazepine level FTs are pending for 3/3 **2/24 reevaluated by psych with the following recommendations: Continue IVC due to high risk of self-harm and others and safety precautions with safety sitter for unpredictable behavior Continue current antipsychotics with as needed dosing available for breakthrough agitation aggression and psychosis Continue current decreased dose of Tegretol-Tegretol level obtained with goal<12 Increase citalopram for management of behavioral disturbance Continue naltrexone Continue melatonin and trazodone for insomnia Consider tapering off or adjusting dosage of Klonopin if medically appropriate- she is on high-dose and requested daily Most importantly, continue to [facility]-term management of chronic psychiatric condition, unpredictable and erratic behaviors, worsening aggression and rapid cycling that results in dangerous behaviors and insult or injury to self and others  Worsening transaminitis in context of hepatitis C -Elevated HCV antibody with markedly elevated HCV RNA quantitative level  consistent with chronic hepatitis C  -11/10: Discussed with ID Dr. Manson PasseyMandahar. Treatment of hepatitis C typically is initiated in the outpatient setting. Medications can be quite expensive and usually take time to obtain insurance approval.  -ID recommends scheduling outpatient appointment their clinic closer to discharge date -Have been following LFTs more closely with the initiation of  Tegretol.  Tegretol level was slightly supratherapeutic and AST, ALT had increased prompting decrease in dosage from 800 mg to 400 mg.  As of 3/2 AST and ALT further increased and now total bilirubin has increased to 1.5 from normal.  Patient had been complaining of some GI symptoms on and off for several months which she confirmed today.   -Plan is to obtain abdominal ultrasound to rule out biliary etiology to  transaminitis otherwise will consider secondary to Tegretol -Repeat LFTs again in a.m. 3/3  Severe insomnia -Continue Remeron and trazodone   Recurrent back pain -Initiate ibuprofen and Robaxin oral as needed  History of polysubstance abuse with heroin/history of outpatient methadone treatment -Prescribed methadone 60 mg daily from a clinic in [place] prior to admission -Continue naltrexone to treat heroin addiction as well as recent issues with impulsivity and hypersexual behaviors during admission which are suspected related to brain injury and worsening psychiatric condition   Depression and severe anxiety disorder/PTSD/no bipolar disorder now with schizoaffective features -Extremes in behavior have significantly improved-continue IVC (last updated 3/1) -See above regarding pharmacotherapy  Recurrent constipation/nausea -Continue Colace 100 mg twice daily prn -Continue MiraLAX to twice daily - has had multiple bowel movements since initiation of scheduled lactulose-lactulose could be contributing to ongoing nausea therefore will change to prn -As a precaution with her history of recurrent UTI will check urinalysis and culture to ensure nausea not secondary to UTI  Chest pain secondary to reflux -Begin Protonix daily    Other problems: Status epilepticus -Resolved -Suspect precipitated by abrupt withdrawal of methadone as reported by patient -Repeat MRI on 12/28 within normal limits and it is felt behavioral issues related to brain injury sequelae and chronic psychiatric condition.  Neurology has signed off -Vimpat, Dilantin and phenobarbital have been tapered and discontinued at recommendation of neurologist  Back pain 2/2 abnormal urinalysis consistent with UTI -Patient with low-grade fever overnight and is now having back pain for days not responsive to NSAIDs and muscle relaxers -DG lumbar spine unremarkable -During hospitalization patient has been treated for  pansensitive E. coli UTI as well as Salmonella UTI both of which have been sensitive to Levaquin -Completed Levaquin 12/20-urine culture obtained after antibiotics initiated and showed no growth -CT abdomen and pelvis on 12/17 unremarkable  Physical deconditioning -Resolved -Does continue to exhibit impulsivity but maintains appropriate balance and gait no longer requires one-to-one safety sitter  Large hemorrhoids. -Resolved -Markedly improved-LD Proctofoam 12/15  Dysphagia -Resolved -Continue regular diet  Back pain 2/2 abnormal urinalysis consistent with UTI/history of Salmonella and E. coli UTI during hospitalization -Patient with low-grade fever overnight and is now having back pain for days not responsive to NSAIDs and muscle relaxers -DG lumbar spine unremarkable -During hospitalization patient has been treated for pansensitive E. coli UTI as well as Salmonella UTI both of which have been sensitive to Levaquin -Completed Levaquin 12/20-urine culture obtained after antibiotics initiated and showed no growth -CT abdomen and pelvis on 12/17 unremarkable  Data Reviewed: Basic Metabolic Panel: Recent Labs  Lab 06/21/20 0853  NA 137  K 3.9  CL 100  CO2 26  GLUCOSE 182*  BUN 14  CREATININE 0.76  CALCIUM 8.9   Liver Function Tests: Recent Labs  Lab 06/21/20 0853  AST 168*  ALT 152*  ALKPHOS 91  BILITOT 1.5*  PROT 5.9*  ALBUMIN 3.2*   No results for input(s): LIPASE, AMYLASE in the last 168 hours. No results for input(s): AMMONIA in the last 168 hours. CBC: Recent Labs  Lab 06/21/20 0853  WBC 2.7*  NEUTROABS 1.0*  HGB 12.9  HCT 37.1  MCV 92.5  PLT 278   Cardiac Enzymes: No results for input(s): CKTOTAL, CKMB, CKMBINDEX, TROPONINI in the last 168 hours. BNP (last 3 results) No results for input(s): BNP in the last 8760 hours.  ProBNP (last 3 results) No results for input(s): PROBNP in the last 8760 hours.  CBG: No results for input(s): GLUCAP in  the last 168 hours.  No results found for this or any previous visit (from the past 240 hour(s)).   Studies: US Abdomen Complete  Result Date: 06/22/2020 CLINICAL DATA:  Elevated transaminase levels. EXAM: ABDOMEN ULTRASOUND COMPLETE COMPARISON:  None. FINDINGS: Gallbladder: Contracted but otherwise unremarkable. No gallstones or wall thickening visualized. No sonographic Murphy sign noted by sonographer. Common bile duct: Diameter: 4 mm Liver: No focal lesion identified. Within normal limits in parenchymal echogenicity. Portal vein is patent on color Doppler imaging with normal direction of blood flow towards the liver. IVC: No abnormality visualized. Pancreas: Visualized portion unremarkable. Spleen: Size and appearance within normal limits. Right Kidney: Length: 9.6 cm. Echogenicity within normal limits. No mass or hydronephrosis visualized. Left Kidney: Length: 11.4 cm. Echogenicity within normal limits. No mass or hydronephrosis visualized. Abdominal aorta: No aneurysm visualized. Distal aorta and bifurcation not seen due to overlying bowel gas. Other findings: None. IMPRESSION: No abnormality identified. Electronically Signed   By: Bary Richard M.D.   On: 06/22/2020 17:45    Scheduled Meds: . carbamazepine  200 mg Oral TID  . citalopram  40 mg Oral Daily  . clonazePAM  1 mg Oral Q6H  . docusate sodium  100 mg Oral BID  . feeding supplement  237 mL Oral BID BM  . melatonin  10 mg Oral QHS  . mirtazapine  30 mg Oral QHS  . multivitamin with minerals  1 tablet Oral Daily  . naltrexone  25 mg Oral Daily  . polyethylene glycol  17 g Oral BID  . vitamin B-6  100 mg Oral Daily  . thiamine  100 mg Oral Daily  . traZODone  150 mg Oral QHS  . ziprasidone  80 mg Oral 2 times per day   Continuous Infusions:   Principal Problem:   Delirium due to multiple etiologies Active Problems:   Acute metabolic encephalopathy   Hypokalemia   Polysubstance abuse (HCC)   Elevated CK   Transaminitis    Refractory seizure (HCC)   Chronic hepatitis C without hepatic coma (HCC)   Distended abdomen   Palliative care encounter   Colonic Ileus (HCC)   Organic brain syndrome   On enteral nutrition   Physical deconditioning   Protein-calorie malnutrition (HCC)   Inadequate oral nutritional intake   Impulse disorder   Consultants:  Neurology  Psychiatry  Interventional radiology  Surgery    Procedures:  9/14 lumbar puncture  9/15 EEG  9/16 EEG  9/17 EEG  9/19 overnight EEG with video  9/22 overnight EEG with video with discontinuation of long-term EEG monitoring on 9/25  9/24 core track placement  10/6 EEG  12/15 PEG tube removed    Antibiotics: Anti-infectives (From admission, onward)   Start     Dose/Rate Route Frequency Ordered Stop   05/11/20 0845  amoxicillin-clavulanate (AUGMENTIN) 500-125 MG per tablet 500 mg        1 tablet Oral Every 8 hours 05/11/20 0750 05/18/20 0559   05/11/20 0845  metroNIDAZOLE (FLAGYL) tablet 500 mg  500 mg Oral Every 8 hours 05/11/20 0750 05/18/20 0559   04/05/20 1415  levofloxacin (LEVAQUIN) tablet 500 mg        500 mg Oral Daily 04/05/20 1327 04/11/20 0911   03/19/20 1000  metroNIDAZOLE (FLAGYL) tablet 500 mg  Status:  Discontinued        500 mg Per Tube 2 times daily 03/19/20 0822 03/23/20 0827   03/18/20 1200  amoxicillin (AMOXIL) 250 MG/5ML suspension 500 mg  Status:  Discontinued        500 mg Per Tube Every 8 hours 03/18/20 0915 03/23/20 0559   03/17/20 1200  cefTRIAXone (ROCEPHIN) 1 g in sodium chloride 0.9 % 100 mL IVPB  Status:  Discontinued        1 g 200 mL/hr over 30 Minutes Intravenous Every 24 hours 03/17/20 1048 03/18/20 0913   03/16/20 1130  metroNIDAZOLE (FLAGYL) 50 mg/ml oral suspension 500 mg  Status:  Discontinued        500 mg Per Tube 2 times daily 03/16/20 1040 03/19/20 0821   03/16/20 1130  fluconazole (DIFLUCAN) 40 MG/ML suspension 152 mg        150 mg Per Tube  Once 03/16/20 1040 03/16/20  1245   02/11/20 1526  ceFAZolin (ANCEF) IVPB 2g/100 mL premix        2 g 200 mL/hr over 30 Minutes Intravenous  Once 02/11/20 1526 02/11/20 1641   02/05/20 0600  ceFAZolin (ANCEF) IVPB 2g/100 mL premix        2 g 200 mL/hr over 30 Minutes Intravenous To Short Stay 02/04/20 1054 02/05/20 0950   01/29/20 0600  ceFAZolin (ANCEF) IVPB 2g/100 mL premix        2 g 200 mL/hr over 30 Minutes Intravenous On call to O.R. 01/28/20 1511 01/30/20 0559   01/28/20 2000  cefTRIAXone (ROCEPHIN) 1 g in sodium chloride 0.9 % 100 mL IVPB        1 g 200 mL/hr over 30 Minutes Intravenous Every 24 hours 01/28/20 1925 02/02/20 0724   01/06/20 0900  cefTRIAXone (ROCEPHIN) 2 g in sodium chloride 0.9 % 100 mL IVPB  Status:  Discontinued        2 g 200 mL/hr over 30 Minutes Intravenous Every 12 hours 01/06/20 0105 01/06/20 2202   01/06/20 0800  vancomycin (VANCOCIN) IVPB 1000 mg/200 mL premix  Status:  Discontinued       "Followed by" Linked Group Details   1,000 mg 200 mL/hr over 60 Minutes Intravenous Every 12 hours 01/05/20 1904 01/06/20 2202   01/05/20 2000  vancomycin (VANCOREADY) IVPB 1500 mg/300 mL       "Followed by" Linked Group Details   1,500 mg 150 mL/hr over 120 Minutes Intravenous  Once 01/05/20 1904 01/06/20 0127   01/05/20 1830  cefTRIAXone (ROCEPHIN) 2 g in sodium chloride 0.9 % 100 mL IVPB        2 g 200 mL/hr over 30 Minutes Intravenous  Once 01/05/20 1829 01/05/20 2220       Time spent: 20 minutes    [name] ANP  [facility] 7 am - 330 pm/M-F for direct patient care and secure chat Please see Amion for contact information 169 days

## 2020-06-23 NOTE — Progress Notes (Signed)
CSW spoke with admissions at [facility] - no beds available at this time.  Edwin Dada, MSW, LCSW Transitions of Care  Clinical Social Worker II [phone]

## 2020-06-24 ENCOUNTER — Inpatient Hospital Stay (HOSPITAL_COMMUNITY): Payer: Medicaid Other

## 2020-06-24 LAB — URINE CULTURE: Culture: >=100000 — AB

## 2020-06-24 LAB — URINALYSIS, ROUTINE W REFLEX MICROSCOPIC
Bilirubin Urine: NEGATIVE
Glucose, UA: NEGATIVE mg/dL
Hgb urine dipstick: NEGATIVE
Ketones, ur: NEGATIVE mg/dL
Leukocytes,Ua: NEGATIVE
Nitrite: NEGATIVE
Protein, ur: NEGATIVE mg/dL
Specific Gravity, Urine: 1.027 (ref 1.005–1.030)
pH: 6 (ref 5.0–8.0)

## 2020-06-24 LAB — CARBAMAZEPINE, FREE AND TOTAL: Carbamazepine, Total: 12.9 ug/mL — ABNORMAL HIGH (ref 4.0–12.0)

## 2020-06-24 NOTE — Progress Notes (Addendum)
[facility] PROGRESS NOTE  Anita Davis NOB:[id number] DOB: 1985/07/13 DOA: 01/04/2020 PCP: [name], PA-C    02/10/20                      02/15/20                       04/08/20    04/01/21  Status: Remains inpatient appropriate because:Altered mental status, Unsafe d/c plan and Inpatient level of care appropriate due to severity of illness   Dispo: The patient is from: Home              [facility]              Anticipated d/c date is: > 3 days              Patient currently is medically stable to d/c.  Barriers to discharge: Significant aggressive and violent outbursts have improved w/ no episodes sine 1/24. Remains on priority list for [facility].  Her father and stepmother now have guardianship of this patient and are planning to bring a copy of this documentation to the hospital. 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  Patient presented to [facility] ER in [place] several times prior to admission.  Initial presentation was related to symptoms from acute cystitis.  Two other presentations related to insomnia.  On those occasions she was able to be discharged from the ER.  She returned again on 13 September being brought in by family for altered mental status.  After admission this patient developed seizure activity which progressed to status epilepticus therefore she was transferred to [facility] for further neurological evaluation.  After several days recurrent seizures were aborted with combination of 3 antiepileptic drugs.  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  Fortunately this did not improve patient's mentation and she continued to have waxing and waning episodes of alertness and encephalopathy.  Since admission she has not had any further seizures so these medications were discontinued.  She has eventually awakened behaviors alternating between awake and appropriate, awake crying and depressed occasionally delusional, severe agitation and aggression with significant delusions.  She has been evaluated both by neurology and psychiatry who suspect a combination of sequelae from underlying brain injury as well as bipolar disorder with schizoaffective features.  During one of her alert phases patient did admit to being diagnosed with bipolar disease prior to admission.  Patient is on multiple psychotropic medications continued to have episodes of violent outbursts.  She had required frequent doses of IM Geodon as well as Recruitment consultant and frequent intervention by security staff during her episodes of severe agitation.  Recent chart review by [facility] states patient would not be a candidate for ECT given recent brain injury sequela as well as presentation with status epilepticus.  Has been stable regarding extreme agitation and/or violent behaviors since 1/24 when she had received her last Geodon IM dose.  On the evening of 2/4 she was also given a dose of IM Geodon.  There was no chart documentation stating what type of behavior she was experiencing but typically this medication is only given if she has extreme agitation.  Subjective: Initially evaluated during pelvic ultrasound.  Somewhat withdrawn at that time.  Return to after pelvic ultrasound completed.  Patient was sitting up in bed with her legs crossed painting fingernails and watching a movie on her pad.  Patient states she cannot remember when her last menstrual cycle was.   Explained to patient I need to examine her abdomen.  She appropriately repositioned herself and laid on the bed.  Stated that she was having lower suprapubic pain that she had described to the nursing staff as being 10/10.  Asked her if she had urinated yet today and she said yes and I asked her to do pain away or improve after urination and she reported yes.  In addition she was complaining of 10/10 chest pain that was sharp and located on the left anterior chest with limited duration.  Discussed with patient that this could be related to reflux especially since she eats and remains in a supine position for multiple hours.  Informed her I had started her on Protonix on 3/3 for these symptoms.  Objective: Vitals:   06/22/20 1336 06/24/20 0558  BP: 105/74 104/65  Pulse: (!) 55 63  Resp: 18 18  Temp: 98 F (36.7 C) 98 F (36.7 C)  SpO2: 98% 98%   No intake or output data in the 24 hours ending 06/24/20 0815 Filed Weights   06/20/20 0438 06/21/20 0529 06/23/20 0500  Weight: 82.1 kg 82.2 kg 83 kg    Exam: General: Awake unification improvement in affect, making eye contact and interacting today Pulmonary:  Lungs are clear and she is stable on room air Cardiac: Normal heart sounds, normotensive, extremities warm to touch Abdomen: Soft nontender, normoactive bowel sounds.  LBM 3/2 Neurological:  Cranial nerves 2-12 intact, no focal neurological deficits and ambulates without difficulty Psychiatric: Awake and much more interactive today with staff and with ADLs.  Oriented to name only.  Continues to have issues regarding complete awareness and understanding of why she remains in the hospital.  Of note both her father and stepmother visited yesterday and this likely has contributed to the improvement in patient's depression symptoms.   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 During periods of alertness and lucidity patient denied use of methamphetamines prior to admission although did admit to abruptly stopping her methadone -Remains intermittently oriented with episodes of delusion and lack of insight into her current psychiatric status -3/4 kg pending in regards to following QTC -Continue max dose Geodon 80 mg BID, naltrexone, scheduled Klonopin, Tegretol, Celexa and Remeron at hour of sleep -Continue Tegretol to 200 mg 3 times daily.  Carbamazepine level still slightly elevated at 12.9 with high normal B12 **2/24 reevaluated by psych with the following recommendations: Continue IVC due to high risk of self-harm and others and safety precautions with safety sitter for unpredictable behavior Continue current antipsychotics with as needed dosing available for breakthrough agitation aggression and psychosis Continue current decreased dose of Tegretol-Tegretol level obtained with goal<12 Increase  citalopram for management of behavioral disturbance Continue naltrexone Continue melatonin and trazodone for insomnia Consider tapering off or adjusting dosage of Klonopin if medically appropriate- she is on high-dose and requested daily Most importantly, continue to recommend [facility] for [facility]-term management of chronic psychiatric condition, unpredictable and erratic behaviors, worsening aggression and rapid cycling that results in dangerous behaviors and insult or injury to self and others -3/4 will send secure chat to psychiatric team to determine if physician to physician there  can be communication with [facility] regarding our expectations for additional hospitalization for this patient and what the endpoint for hospitalization would be.  The [place] that the patient will eventually be living and if she is ever able to be discharged to home environment essentially has no outpatient psychiatric care available in the area.  Also the family have significant concerns about bringing the patient home to a noncontrolled environment.  These issues would need to be addressed before discharge if [facility] would otherwise feel patient appropriate to discharge after the excepted her and managed her psychiatric issues.  Worsening transaminitis in context of hepatitis C -Elevated HCV antibody with markedly elevated HCV RNA quantitative level  consistent with chronic hepatitis C  -11/10: Discussed with ID Dr. Manson Passey. Treatment of hepatitis C typically is initiated in the outpatient setting. Medications can be quite expensive and usually take time to obtain insurance approval.  -ID recommends scheduling outpatient appointment their clinic closer to discharge date -Nominal ultrasound unremarkable -Because of upward trend in LFTs Tegretol dosage has been tapered down from 800 mg 3 times daily to 200 mg 3 times daily with last dosage adjustment on 3/3. -Repeat LFTs 3/3 revealed slight increase in AST and ALT but normalization of TB  Severe insomnia -Continue Remeron and trazodone   Recurrent back pain -Initiate ibuprofen and Robaxin oral as needed  History of polysubstance abuse with heroin/history of outpatient methadone treatment -Prescribed methadone 60 mg daily from a clinic in [address] prior to admission -Continue naltrexone to treat heroin addiction as well as recent issues with impulsivity and hypersexual behaviors during admission which are suspected related to brain injury and worsening psychiatric condition   Depression and severe anxiety disorder/PTSD/no bipolar disorder now with  schizoaffective features -Extremes in behavior have significantly improved-continue IVC (last updated 3/1) -See above regarding pharmacotherapy  Recurrent constipation/nausea -Continue Colace 100 mg twice daily prn -Continue MiraLAX to twice daily - has had multiple bowel movements since initiation of scheduled lactulose-lactulose could be contributing to ongoing nausea therefore will change to prn -As a precaution with her history of recurrent UTI will check urinalysis and culture to ensure nausea not secondary to UTI  Chest pain secondary to reflux -Cont Protonix daily -Ck EKG (need to ck Qtc 2/2 Geodon)  Pelvic pain -Reported to staff as 10/10 -ck UA -? Menstrual etiology-patient cannot remember when her last menstrual cycle occurred -Pelvic ultrasound unremarkable and symptoms seem to improve after voiding    Other problems: Status epilepticus -Resolved -Suspect precipitated by abrupt withdrawal of methadone as reported by patient -Repeat MRI on 12/28 within normal limits and it is felt behavioral issues related to brain injury sequelae and chronic psychiatric condition.  Neurology has signed off -Vimpat, Dilantin and phenobarbital have been tapered and discontinued at recommendation of neurologist  Back pain 2/2 abnormal urinalysis consistent with UTI -Patient with low-grade fever overnight and is now having back pain for days not responsive to NSAIDs and muscle relaxers -DG lumbar spine unremarkable -During  hospitalization patient has been treated for pansensitive E. coli UTI as well as Salmonella UTI both of which have been sensitive to Levaquin -Completed Levaquin 12/20-urine culture obtained after antibiotics initiated and showed no growth -CT abdomen and pelvis on 12/17 unremarkable  Physical deconditioning -Resolved -Does continue to exhibit impulsivity but maintains appropriate balance and gait no longer requires one-to-one safety sitter  Large  hemorrhoids. -Resolved -Markedly improved-LD Proctofoam 12/15  Dysphagia -Resolved -Continue regular diet  Back pain 2/2 abnormal urinalysis consistent with UTI/history of Salmonella and E. coli UTI during hospitalization -Patient with low-grade fever overnight and is now having back pain for days not responsive to NSAIDs and muscle relaxers -DG lumbar spine unremarkable -During hospitalization patient has been treated for pansensitive E. coli UTI as well as Salmonella UTI both of which have been sensitive to Levaquin -Completed Levaquin 12/20-urine culture obtained after antibiotics initiated and showed no growth -CT abdomen and pelvis on 12/17 unremarkable  Data Reviewed: Basic Metabolic Panel: Recent Labs  Lab 06/21/20 0853 06/23/20 1458  NA 137 139  K 3.9 4.4  CL 100 106  CO2 26 25  GLUCOSE 182* 127*  BUN 14 14  CREATININE 0.76 0.79  CALCIUM 8.9 8.8*   Liver Function Tests: Recent Labs  Lab 06/21/20 0853 06/23/20 1458  AST 168* 194*  ALT 152* 164*  ALKPHOS 91 107  BILITOT 1.5* 1.2  PROT 5.9* 6.4*  ALBUMIN 3.2* 3.4*   No results for input(s): LIPASE, AMYLASE in the last 168 hours. No results for input(s): AMMONIA in the last 168 hours. CBC: Recent Labs  Lab 06/21/20 0853 06/23/20 1458  WBC 2.7* 3.1*  NEUTROABS 1.0*  --   HGB 12.9 13.0  HCT 37.1 39.0  MCV 92.5 94.9  PLT 278 262   Cardiac Enzymes: No results for input(s): CKTOTAL, CKMB, CKMBINDEX, TROPONINI in the last 168 hours. BNP (last 3 results) No results for input(s): BNP in the last 8760 hours.  ProBNP (last 3 results) No results for input(s): PROBNP in the last 8760 hours.  CBG: No results for input(s): GLUCAP in the last 168 hours.  No results found for this or any previous visit (from the past 240 hour(s)).   Studies: US Abdomen Complete  Result Date: 06/22/2020 CLINICAL DATA:  Elevated transaminase levels. EXAM: ABDOMEN ULTRASOUND COMPLETE COMPARISON:  None. FINDINGS: Gallbladder:  Contracted but otherwise unremarkable. No gallstones or wall thickening visualized. No sonographic Murphy sign noted by sonographer. Common bile duct: Diameter: 4 mm Liver: No focal lesion identified. Within normal limits in parenchymal echogenicity. Portal vein is patent on color Doppler imaging with normal direction of blood flow towards the liver. IVC: No abnormality visualized. Pancreas: Visualized portion unremarkable. Spleen: Size and appearance within normal limits. Right Kidney: Length: 9.6 cm. Echogenicity within normal limits. No mass or hydronephrosis visualized. Left Kidney: Length: 11.4 cm. Echogenicity within normal limits. No mass or hydronephrosis visualized. Abdominal aorta: No aneurysm visualized. Distal aorta and bifurcation not seen due to overlying bowel gas. Other findings: None. IMPRESSION: No abnormality identified. Electronically Signed   By: Bary RichardStan  Maynard M.D.   On: 06/22/2020 17:45    Scheduled Meds: . carbamazepine  200 mg Oral TID  . citalopram  40 mg Oral Daily  . clonazePAM  1 mg Oral Q6H  . docusate sodium  100 mg Oral BID  . feeding supplement  237 mL Oral BID BM  . melatonin  10 mg Oral QHS  . mirtazapine  30 mg Oral QHS  . multivitamin with  minerals  1 tablet Oral Daily  . naltrexone  25 mg Oral Daily  . pantoprazole  40 mg Oral Daily  . polyethylene glycol  17 g Oral BID  . vitamin B-6  100 mg Oral Daily  . thiamine  100 mg Oral Daily  . traZODone  150 mg Oral QHS  . ziprasidone  80 mg Oral 2 times per day   Continuous Infusions:   Principal Problem:   Delirium due to multiple etiologies Active Problems:   Acute metabolic encephalopathy   Hypokalemia   Polysubstance abuse (HCC)   Elevated CK   Transaminitis   Refractory seizure (HCC)   Chronic hepatitis C without hepatic coma (HCC)   Distended abdomen   Palliative care encounter   Colonic Ileus (HCC)   Organic brain syndrome   On enteral nutrition   Physical deconditioning   Protein-calorie  malnutrition (HCC)   Inadequate oral nutritional intake   Impulse disorder   Consultants:  Neurology  Psychiatry  Interventional radiology  Surgery    Procedures:  9/14 lumbar puncture  9/15 EEG  9/16 EEG  9/17 EEG  9/19 overnight EEG with video  9/22 overnight EEG with video with discontinuation of long-term EEG monitoring on 9/25  9/24 core track placement  10/6 EEG  12/15 PEG tube removed    Antibiotics: Anti-infectives (From admission, onward)   Start     Dose/Rate Route Frequency Ordered Stop   05/11/20 0845  amoxicillin-clavulanate (AUGMENTIN) 500-125 MG per tablet 500 mg        1 tablet Oral Every 8 hours 05/11/20 0750 05/18/20 0559   05/11/20 0845  metroNIDAZOLE (FLAGYL) tablet 500 mg        500 mg Oral Every 8 hours 05/11/20 0750 05/18/20 0559   04/05/20 1415  levofloxacin (LEVAQUIN) tablet 500 mg        500 mg Oral Daily 04/05/20 1327 04/11/20 0911   03/19/20 1000  metroNIDAZOLE (FLAGYL) tablet 500 mg  Status:  Discontinued        500 mg Per Tube 2 times daily 03/19/20 0822 03/23/20 0827   03/18/20 1200  amoxicillin (AMOXIL) 250 MG/5ML suspension 500 mg  Status:  Discontinued        500 mg Per Tube Every 8 hours 03/18/20 0915 03/23/20 0559   03/17/20 1200  cefTRIAXone (ROCEPHIN) 1 g in sodium chloride 0.9 % 100 mL IVPB  Status:  Discontinued        1 g 200 mL/hr over 30 Minutes Intravenous Every 24 hours 03/17/20 1048 03/18/20 0913   03/16/20 1130  metroNIDAZOLE (FLAGYL) 50 mg/ml oral suspension 500 mg  Status:  Discontinued        500 mg Per Tube 2 times daily 03/16/20 1040 03/19/20 0821   03/16/20 1130  fluconazole (DIFLUCAN) 40 MG/ML suspension 152 mg        150 mg Per Tube  Once 03/16/20 1040 03/16/20 1245   02/11/20 1526  ceFAZolin (ANCEF) IVPB 2g/100 mL premix        2 g 200 mL/hr over 30 Minutes Intravenous  Once 02/11/20 1526 02/11/20 1641   02/05/20 0600  ceFAZolin (ANCEF) IVPB 2g/100 mL premix        2 g 200 mL/hr over 30 Minutes  Intravenous To Short Stay 02/04/20 1054 02/05/20 0950   01/29/20 0600  ceFAZolin (ANCEF) IVPB 2g/100 mL premix        2 g 200 mL/hr over 30 Minutes Intravenous On call to O.R. 01/28/20 1511 01/30/20 0559  01/28/20 2000  cefTRIAXone (ROCEPHIN) 1 g in sodium chloride 0.9 % 100 mL IVPB        1 g 200 mL/hr over 30 Minutes Intravenous Every 24 hours 01/28/20 1925 02/02/20 0724   01/06/20 0900  cefTRIAXone (ROCEPHIN) 2 g in sodium chloride 0.9 % 100 mL IVPB  Status:  Discontinued        2 g 200 mL/hr over 30 Minutes Intravenous Every 12 hours 01/06/20 0105 01/06/20 2202   01/06/20 0800  vancomycin (VANCOCIN) IVPB 1000 mg/200 mL premix  Status:  Discontinued       "Followed by" Linked Group Details   1,000 mg 200 mL/hr over 60 Minutes Intravenous Every 12 hours 01/05/20 1904 01/06/20 2202   01/05/20 2000  vancomycin (VANCOREADY) IVPB 1500 mg/300 mL       "Followed by" Linked Group Details   1,500 mg 150 mL/hr over 120 Minutes Intravenous  Once 01/05/20 1904 01/06/20 0127   01/05/20 1830  cefTRIAXone (ROCEPHIN) 2 g in sodium chloride 0.9 % 100 mL IVPB        2 g 200 mL/hr over 30 Minutes Intravenous  Once 01/05/20 1829 01/05/20 2220       Time spent: 20 minutes    [name] ANP  [facility] 7 am - 330 pm/M-F for direct patient care and secure chat Please see Amion for contact information 170 days

## 2020-06-24 NOTE — Progress Notes (Addendum)
TOC staff obtained batteries for patient's water fountain and delivered them to her at bedside.   No [facility] beds available today.  CSW called [facility] to determine appointment availability - there is currently a waiting list that is out 6-9 months.  Edwin Dada, MSW, LCSW Transitions of Care  Clinical Social Worker II [phone]

## 2020-06-24 NOTE — Consult Note (Signed)
[facility] Consult   Reason for Consult: Reassessment for ongoing psychiatric inpatient Referring Physician:  Dr. Algie CofferInka Pahwant Patient Identification: Anita Davis Davis MRN:  [id number] Principal Diagnosis: Organic brain syndrome Diagnosis:  Principal Problem:   Organic brain syndrome Active Problems:   Polysubstance abuse (HCC)   Transaminitis   Refractory seizure (HCC)   Chronic hepatitis C without hepatic coma (HCC)   Distended abdomen   Palliative care encounter   Colonic Ileus (HCC)   Inadequate oral nutritional intake   Impulse disorder   Total Time spent with patient: 45 minutes  Subjective:   Anita Davis is a 35 y.o. female patient with a history of polysubstance use disorder, who presented to [facility] in Sep with encephalopathy. EEG showed status epilepticus arising from the left central region. She was treated with Keppra, Vimpat and Dilantin and her mental status reportedly improved. [facility] was [facility].  Patient seen and assessed by this nurse practitioner, case discussed with Dr. Lucianne MussKumar.  Patient is observed to be sitting upright playing a card game with her safety sitter at the bedside.  Patient is alert and oriented x3, calm and cooperative, and very pleasant.  On today's evaluation she denies any new psychiatric concerns.  She is able to discuss a visit that she had with her family, and the positive influence it had on her overall feelings.  She does feel as though she is progressing, and has been able to control her behaviors.  She is able to vocalize "I want to go back on my to Klonopin's a day.  I used to take and blue 1 and a yellow 1.  If I have to sit in his room all day by myself I would like to have my medication back.  "  Patient also expresses her interest in discharging to her previous living conditions with [facility] [place]she states she has received [facility], with a  therapist named Albin FellingCarla.  At the time of this evaluation patient is currently taking it carbamazepine 200 mg p.o. 3 times daily(recent level obtained on June 23, 2020, 12.9), clonazepam 1 mg p.o. every 6 hours, citalopram 40 mg p.o. daily,  mirtazapine 30 mg p.o. nightly, trazodone 150 mg p.o. nightly, naltrexone 25 mg p.o. daily and ziprasidone 80 mg p.o. twice daily.  She denies any side effects and or adverse reactions at this time.   She continues to present with improvement in her behaviors, as measured by her decreased need for as needed medication, aggression, ability to interact with her staff and family members, and no evidence of unpredictable or impulsive behaviors.She denies any depressive symptoms or anxiety at this time. She does not present as manic, delusional, paranoid, or psychotic. She does not appear to be responding to internal/external stimuli or preoccupied. She denies any suicidal thoughts, homicidal thoughts and or hallucinations.   HP/synopsis: Please see full note from Junious SilkAllison Ellis nurse practitioner, and has also been following this patient throughout her duration of stay of 115 days.  Since admission patient has had varying levels of awakeness and orientation as well as labile mood and affect.  Essentially for the first 8 weeks of hospitalization patient would have brief episodes of being awake, sometimes oriented sometimes not.  These episodes would last for several minutes to several hours but were never sustained.  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  Multiple psychotropic medications were tried  without success or caused significant side effects and were discontinued.  Decision made to taper and wean all psychotropic drugs.  After this was done patient became extremely alert and agitated.  She was very impulsive and was demonstrating hypersexual behavior as well as extreme mood swings.  Seroquel tried initially to manage brain  injury sequela but required high dosages unfortunately contributing to patient's depression and suicidal ideation.  It was noted that each time patient was given IM Geodon that she became alert and for the most part oriented, was able to consistently communicate and perform ADLs.  Eventually she was transitioned to oral Geodon.  Naltrexone had been started to assist with underlying hypersexual behaviors and impulsivity and to treat her underlying heroin addiction since she had not had any methadone or heroin since admission.  She has also been placed on Klonopin and clonidine to aid with her severe anxiety.  Past Psychiatric History: Please see initial evaluation for full details. I have reviewed the history. No updates at this time.   Risk to Self:  yes Risk to Others:  yes Prior Inpatient Therapy:  see initial eval. No update Prior Outpatient Therapy:  see initial eval. No update  Past Medical History:  Past Medical History:  Diagnosis Date  . Allergy   . Bronchitis   . Hepatitis-C   . History of gestational diabetes   . HPV in female   . Substance abuse [facility])     Past Surgical History:  Procedure Laterality Date  . HEMORRHOID BANDING  age 3  . IR GASTROSTOMY TUBE REMOVAL  04/06/2020  . IR REPLC GASTRO/COLONIC TUBE PERCUT W/FLUORO  02/11/2020  . LAPAROSCOPIC GASTROSTOMY N/A 02/05/2020   Procedure: LAPAROSCOPIC GASTROSTOMY PLACEMENT;  Surgeon: Gaynelle Adu, MD;  Location: [facility] OR;  Service: General;  Laterality: N/A;   Family History:  Family History  Problem Relation Age of Onset  . Depression Mother   . Cervical cancer Mother   . Hypertension Father   . Diabetes Father    Family Psychiatric  History: Please see initial evaluation for full details. I have reviewed the history. No updates at this time.    Social History:  Social History   Substance and Sexual Activity  Alcohol Use No     Social History   Substance and Sexual Activity  Drug Use Not Currently  . Types:  IV, Cocaine, Hydrocodone, Oxycodone, Marijuana, Heroin   Comment: takes methadone- now taking subutex (03/27/19)    Social History   Socioeconomic History  . Marital status: Single    Spouse name: Not on file  . Number of children: Not on file  . Years of education: Not on file  . Highest education level: Not on file  Occupational History  . Not on file  Tobacco Use  . Smoking status: Current Every Day Smoker    Years: 24.00    Types: Cigarettes, Cigars  . Smokeless tobacco: Never Used  . Tobacco comment: 1 cigar/daily. former 1 pack cigarette smoker quit 05/2019  currently smokes CBD  Vaping Use  . Vaping Use: Every day  . Substances: Nicotine, Flavoring  Substance and Sexual Activity  . Alcohol use: No  . Drug use: Not Currently    Types: IV, Cocaine, Hydrocodone, Oxycodone, Marijuana, Heroin    Comment: takes methadone- now taking subutex (03/27/19)  . Sexual activity: Not Currently    Birth control/protection: Abstinence  Other Topics Concern  . Not on file  Social History Narrative  . Not on file  Social Determinants of Health   Financial Resource Strain: Not on file  Food Insecurity: Not on file  Transportation Needs: Not on file  Physical Activity: Not on file  Stress: Not on file  Social Connections: Not on file   Additional Social History:    Allergies:  No Known Allergies  Labs:  Results for orders placed or performed during the hospital encounter of 01/04/20 (from the past 48 hour(s))  CBC     Status: Abnormal   Collection Time: 06/23/20  2:58 PM  Result Value Ref Range   WBC 3.1 (L) 4.0 - 10.5 K/uL   RBC 4.11 3.87 - 5.11 MIL/uL   Hemoglobin 13.0 12.0 - 15.0 g/dL   HCT 56.3 14.9 - 70.2 %   MCV 94.9 80.0 - 100.0 fL   MCH 31.6 26.0 - 34.0 pg   MCHC 33.3 30.0 - 36.0 g/dL   RDW 63.7 85.8 - 85.0 %   Platelets 262 150 - 400 K/uL   nRBC 0.0 0.0 - 0.2 %    Comment: Performed at [facility], [address], [place], [place] [address]   Comprehensive metabolic panel     Status: Abnormal   Collection Time: 06/23/20  2:58 PM  Result Value Ref Range   Sodium 139 135 - 145 mmol/L   Potassium 4.4 3.5 - 5.1 mmol/L   Chloride 106 98 - 111 mmol/L   CO2 25 22 - 32 mmol/L   Glucose, Bld 127 (H) 70 - 99 mg/dL    Comment: Glucose reference range applies only to samples taken after fasting for at least 8 hours.   BUN 14 6 - 20 mg/dL   Creatinine, Ser 2.87 0.44 - 1.00 mg/dL   Calcium 8.8 (L) 8.9 - 10.3 mg/dL   Total Protein 6.4 (L) 6.5 - 8.1 g/dL   Albumin 3.4 (L) 3.5 - 5.0 g/dL   AST 867 (H) 15 - 41 U/L   ALT 164 (H) 0 - 44 U/L   Alkaline Phosphatase 107 38 - 126 U/L   Total Bilirubin 1.2 0.3 - 1.2 mg/dL   GFR, Estimated >67 >20 mL/min    Comment: (NOTE) Calculated using the CKD-EPI Creatinine Equation (2021)    Anion gap 8 5 - 15    Comment: Performed at [facility], [address], [place], [place] [address]  Carbamazepine level, free     Status: Abnormal   Collection Time: 06/23/20  2:58 PM  Result Value Ref Range   Carbamazepine, Free NWTNP ug/mL    Comment: (NOTE) Test not performed. Testing for free drug concentration was made temporarily non-orderable due to an ongoing nation-wide shortage of a device required for testing.  As the shortage has broad national impact, a referral test is not available. The test for total drug concentration remains orderable.  The test for free drug concentration will be reactivated as supplies allow.                                Detection Limit = 0.5    Carbamazepine, Total 12.9 (H) 4.0 - 12.0 ug/mL    Comment: (NOTE)         In conjunction with other antiepileptic drugs                               Therapeutic  4.0 -  8.0  Toxicity     9.0 - 12.0                                   Carbamazepine alone                               Therapeutic  8.0 - 12.0                                Detection Limit =  0.5                          <0.5  indicated None Detected Patient drug level exceeds published reference range.  Evaluate clinically for signs of potential toxicity. Performed At: [facility] [address] [place], [place] [id number] Jolene Schimke MD [id number]     Current Facility-Administered Medications  Medication Dose Route Frequency Provider Last Rate Last Admin  . carbamazepine (TEGRETOL) tablet 200 mg  200 mg Oral TID Russella Dar, NP   200 mg at 06/24/20 0914  . citalopram (CELEXA) tablet 40 mg  40 mg Oral Daily Maryagnes Amos, FNP   40 mg at 06/24/20 0913  . clonazePAM (KLONOPIN) tablet 1 mg  1 mg Oral Q6H Russella Dar, NP   1 mg at 06/24/20 1254  . docusate sodium (COLACE) capsule 100 mg  100 mg Oral BID Russella Dar, NP   100 mg at 06/24/20 0914  . feeding supplement (ENSURE ENLIVE / ENSURE PLUS) liquid 237 mL  237 mL Oral BID BM Hughie Closs, MD   237 mL at 06/24/20 0913  . ibuprofen (ADVIL) tablet 600 mg  600 mg Oral Q6H PRN Russella Dar, NP   600 mg at 06/24/20 0640  . lactulose (CHRONULAC) 10 GM/15ML solution 10 g  10 g Oral Daily PRN Russella Dar, NP      . loratadine (CLARITIN) tablet 10 mg  10 mg Oral Daily PRN Chotiner, Claudean Severance, MD   10 mg at 06/24/20 0640  . melatonin tablet 10 mg  10 mg Oral QHS Maryagnes Amos, FNP   10 mg at 06/23/20 2139  . methocarbamol (ROBAXIN) tablet 500 mg  500 mg Oral Q6H PRN Russella Dar, NP      . mirtazapine (REMERON) tablet 30 mg  30 mg Oral QHS Russella Dar, NP   30 mg at 06/23/20 2139  . multivitamin with minerals tablet 1 tablet  1 tablet Oral Daily Russella Dar, NP   1 tablet at 06/24/20 0913  . naltrexone (DEPADE) tablet 25 mg  25 mg Oral Daily Russella Dar, NP   25 mg at 06/24/20 0913  . ondansetron (ZOFRAN) tablet 4 mg  4 mg Oral Q6H PRN Russella Dar, NP   4 mg at 06/20/20 0831  . pantoprazole (PROTONIX) EC tablet 40 mg  40 mg Oral Daily Russella Dar, NP   40 mg at 06/24/20 0914  .  polyethylene glycol (MIRALAX / GLYCOLAX) packet 17 g  17 g Oral BID Russella Dar, NP   17 g at 06/23/20 1034  . pyridOXINE (VITAMIN B-6) tablet 100 mg  100 mg Oral Daily Russella Dar, NP   100 mg at 06/24/20 0914  . sodium chloride (  OCEAN) 0.65 % nasal spray 1 spray  1 spray Each Nare PRN Omelia Blackwater K, NP      . thiamine tablet 100 mg  100 mg Oral Daily Russella Dar, NP   100 mg at 06/24/20 0914  . traZODone (DESYREL) tablet 150 mg  150 mg Oral QHS Russella Dar, NP   150 mg at 06/23/20 2139  . ziprasidone (GEODON) capsule 80 mg  80 mg Oral 2 times per day Marguerita Merles Latif, DO   80 mg at 06/24/20 2130  . ziprasidone (GEODON) injection 20 mg  20 mg Intramuscular Q8H PRN Russella Dar, NP   20 mg at 05/27/20 2217    Musculoskeletal: Strength & Muscle Tone: N/A Gait & Station: normal Patient leans: N/A  Psychiatric Specialty Exam: Physical Exam Vitals and nursing note reviewed.     Review of Systems  Unable to perform ROS: Other    Blood pressure 98/65, pulse (!) 57, temperature 97.9 F (36.6 C), temperature source Oral, resp. rate 16, height  (1.651 m), weight 83 kg, SpO2 98 %.Body mass index is 30.45 kg/m.  General Appearance: Fairly Groomed  Eye Contact:  Fair  Speech:  Clear and Coherent and Normal Rate  Volume:  Normal,  Mood:  Euthymic  Affect:  Appropriate and Congruent  Thought Process:  Coherent, Linear and Descriptions of Associations: Intact,   Orientation:  Full (Time, Place, and Person)  Thought Content:  Logical  Suicidal Thoughts:  No  Homicidal Thoughts:  No  Memory:  Immediate;   Fair Recent;   Fair Remote;   Fair  Judgement:  Intact  Insight:  Present  Psychomotor Activity:  Normal  Concentration:  limited  Recall:  [employer] of Knowledge:  Fair  Language:  Fair  Akathisia:  No  Handed:  Right  AIMS (if indicated):     Assets:  Desire for Improvement Resilience Social Support  ADL's:  Complete  Cognition: WNL  Sleep:        Assessment Lourie Retz is a 35 y.o. year old female with a history of polysubstance use disorder, who presented to [facility] hospital in Sep 2021 with encephalopathy. EEG showed status epilepticus arising from the left central region. She was treated with Keppra, Vimpat and Dilantin and her mental status reportedly improved. She was transferred to [facility] for further work up. Per chart review, her mental status has improved, in addition to her moods. She has not received any prn medications in over 2 weeks.  #Bipolar affective psychosis  # PTSD # Substance-induced mood disorder  Patient presents with improved behaviors and mental status. She remains on 1:1 at this time, due to unpredictable behaviors that she is no longer exhibiting. SHe last received Geodon IM on 05/27/2020. The need for prn medications has been reduced, and she appears to have stabilized. While she may exhibit emotional dysregulation and unpredictable behaviors, her level of aggression has decreased.  Patient will be psychiatrically cleared as of today June 24, 2020.  Will continue to work closely with T[facility] for safe disposition plan, to include outpatient psychiatric follow-up for medication management, and therapy.  Discuss ongoing availability in the event of a family planning session to ensure safe disposition, as patient has been hospitalized for approximately 6 months.   Treatment Plan Summary: Plan as below -Continue IVC due to high risk of self harm and others.  -Will d/c safety sitter at this time. Continue to monitor her closely.  -Continue Geodon 80  mg BID  -Continue Geodon 20 mg IM twice daily as needed for agitation, aggression, and psychosis. -Will continue Tegretol 200mg  po TID, recently reduced due to elevated liver enzymes, Tegretol levels are at goal.  -Will  Continue citalopram 40 mg p.o. daily for management of behavioral disturbances. -Continue naltrexone 25 mg daily  -Continue melatonin and  Trazodone150mg  po qhs  -Consider tapering off or adjust dosing Klonopin if medically appropriate.  She is currently on Klonopin 1mg  po Q6 hr, and has request for an increase.  Recommend continuing to taper, as patient does appear to have interest in returning to previous social environment that included heavy drug use.   Disposition: No evidence of imminent risk to self or others at present.   Patient does not meet criteria for psychiatric inpatient admission. Supportive therapy provided about ongoing stressors. Refer to IOP.    , FNP 06/24/2020 3:12 PM

## 2020-06-25 LAB — COMPREHENSIVE METABOLIC PANEL
ALT: 143 U/L — ABNORMAL HIGH (ref 0–44)
AST: 166 U/L — ABNORMAL HIGH (ref 15–41)
Albumin: 3.1 g/dL — ABNORMAL LOW (ref 3.5–5.0)
Alkaline Phosphatase: 126 U/L (ref 38–126)
Anion gap: 8 (ref 5–15)
BUN: 13 mg/dL (ref 6–20)
CO2: 24 mmol/L (ref 22–32)
Calcium: 8.4 mg/dL — ABNORMAL LOW (ref 8.9–10.3)
Chloride: 105 mmol/L (ref 98–111)
Creatinine, Ser: 0.65 mg/dL (ref 0.44–1.00)
GFR, Estimated: >60 mL/min (ref >60)
Glucose, Bld: 156 mg/dL — ABNORMAL HIGH (ref 70–99)
Potassium: 3.8 mmol/L (ref 3.5–5.1)
Sodium: 137 mmol/L (ref 135–145)
Total Bilirubin: 1.5 mg/dL — ABNORMAL HIGH (ref 0.3–1.2)
Total Protein: 5.7 g/dL — ABNORMAL LOW (ref 6.5–8.1)

## 2020-06-25 LAB — CBC
HCT: 38.4 % (ref 36.0–46.0)
Hemoglobin: 12.7 g/dL (ref 12.0–15.0)
MCH: 31.7 pg (ref 26.0–34.0)
MCHC: 33.1 g/dL (ref 30.0–36.0)
MCV: 95.8 fL (ref 80.0–100.0)
Platelets: 254 10*3/uL (ref 150–400)
RBC: 4.01 MIL/uL (ref 3.87–5.11)
RDW: 13 % (ref 11.5–15.5)
WBC: 4 10*3/uL (ref 4.0–10.5)
nRBC: 0 % (ref 0.0–0.2)

## 2020-06-25 NOTE — Progress Notes (Signed)
Pt continues to complain of "burning" with urination.  U/A collected yesterday negative for UTI.  MD aware.  ? If vaginal.

## 2020-06-25 NOTE — Progress Notes (Signed)
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  Patient presented to [facility], ER in [place] several times prior to admission.  Initial presentation was related to symptoms from acute cystitis.  Two other presentations related to insomnia.  On those occasions she was able to be discharged from the ER.  She returned again on 13 September being brought in by family for altered mental status.  After admission this patient developed seizure activity which progressed to status epilepticus therefore she was transferred to [facility] for further neurological evaluation.  After several days recurrent seizures were aborted with combination of 3 antiepileptic drugs.  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  Fortunately this did not improve patient's mentation and she continued to have waxing and waning episodes of alertness and encephalopathy.  Since admission she has not had any further seizures so these medications were discontinued.  She has eventually awakened behaviors alternating between awake and appropriate, awake crying and depressed occasionally delusional, severe agitation and aggression with significant delusions.  She has been evaluated both by neurology and psychiatry who suspect a combination of sequelae from underlying brain injury as well as bipolar disorder with schizoaffective features.  During one of her alert phases patient did admit to being diagnosed with bipolar disease prior to admission.  Patient is on multiple psychotropic medications continued to have episodes of violent outbursts.  She had required frequent doses of IM Geodon as well as  Recruitment consultant and frequent intervention by security staff during her episodes of severe agitation.  Recent chart review by [facility] states patient would not be a candidate for ECT given recent brain injury sequela as well as presentation with status epilepticus.  Has been stable regarding extreme agitation and/or violent behaviors since 1/24 when she had received her last Geodon IM dose.  On the evening of 2/4 she was also given a dose of IM Geodon.  There was no chart documentation stating what type of behavior she was experiencing but typically this medication is only given if she has extreme agitation.  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 -Continue Tegretol 200 mg p.o. 3 times daily, Celexa 10 mg daily, naltrexone 25 mg daily, melatonin and trazodone -Continue Klonopin-consider tapering as she is on 1 mg p.o. every 6 hours.  Elevated liver enzymes in context of hepatitis C -Liver enzymes improving.  Right upper quadrant ultrasound: WNL.  Continue to monitor  Severe insomnia -Continue Remeron and trazodone   Recurrent back pain -Continue ibuprofen and Robaxin oral as needed  History of polysubstance abuse with heroin/history of outpatient methadone treatment -Continue naltrexone to treat heroin addiction as well as recent issues with impulsivity and hypersexual behaviors during admission which are  suspected related to brain injury  and worsening psychiatric condition   Depression and severe anxiety disorder/PTSD/no bipolar disorder now with schizoaffective features --continue IVC (last updated 3/1)  Recurrent constipation/nausea -Continue Colace and MiraLAX  Chest pain secondary to reflux -Cont Protonix daily  Pelvic pain -UA negative for infection, pelvic ultrasound: Unremarkable -Increase p.o. hydration  Status epilepticus -Resolved -Suspect precipitated by abrupt withdrawal of methadone as reported by patient -MRI: Negative for acute findings. Neurology has signed off -Vimpat, Dilantin and phenobarbital have been tapered and discontinued at recommendation of neurologist  Physical deconditioning -Resolved  Large hemorrhoids. -Resolved -Markedly improved-LD Proctofoam 12/15   DVT prophylaxis: Patient is ambulating  code Status: Full code Family Communication: Recruitment consultantafety sitter present at bedside.  Plan of care discussed with patient in length and she verbalized understanding and agreed with it. Disposition Plan: To be determined  Consultants:   Neurology  Psychiatry  GI  Procedures:   None  Antimicrobials:   None  Status is: Inpatient   Dispo: The patient is from: Home              [facility]              Patient currently is medically stable to d/c.   Difficult to place patient Yes         Subjective: Patient seen and examined.  Resting comfortably on the bed.  Safety sitter at the bedside.  Patient denies any new complaints as per RN patient is having burning when she pees.  No fever, chills, nausea or vomiting reported.  No acute events overnight.  Objective: Vitals:   06/24/20 0558 06/24/20 1459 06/24/20 2058 06/25/20 0516  BP: 104/65 98/65 100/66 102/70  Pulse: 63 (!) 57 62 60  Resp: 18 16 18 17   Temp: 98 F (36.7 C) 97.9 F (36.6 C) 98.1 F (36.7 C) 97.8 F (36.6 C)   TempSrc: Oral Oral Oral Oral  SpO2: 98% 98% 98% 98%  Weight:    83 kg  Height:        Intake/Output Summary (Last 24 hours) at 06/25/2020 1141 Last data filed at 06/25/2020 0700 Gross per 24 hour  Intake 930 ml  Output --  Net 930 ml   Filed Weights   06/21/20 0529 06/23/20 0500 06/25/20 0516  Weight: 82.2 kg 83 kg 83 kg    Examination:  General exam: Appears calm and comfortable, on room air, flat affect not very interactive this morning Respiratory system: Clear to auscultation. Respiratory effort normal. Cardiovascular system: S1 & S2 heard, RRR. No JVD, murmurs, rubs, gallops or clicks. No pedal edema. Gastrointestinal system: Abdomen is nondistended, soft and nontender. No organomegaly or masses felt. Normal bowel sounds heard. Central nervous system: Alert and oriented. No focal neurological deficits. Extremities: Symmetric 5 x 5 power. Skin: No rashes, lesions or ulcers   Data Reviewed: I have personally reviewed following labs and imaging studies  CBC: Recent Labs  Lab 06/21/20 0853 06/23/20 1458 06/25/20 0117  WBC 2.7* 3.1* 4.0  NEUTROABS 1.0*  --   --   HGB 12.9 13.0 12.7  HCT 37.1 39.0 38.4  MCV 92.5 94.9 95.8  PLT 278 262 254   Basic Metabolic Panel: Recent Labs  Lab 06/21/20 0853 06/23/20 1458 06/25/20 0117  NA 137 139 137  K 3.9 4.4 3.8  CL 100 106 105  CO2 26 25 24   GLUCOSE 182* 127* 156*  BUN 14 14 13   CREATININE 0.76 0.79 0.65  CALCIUM 8.9 8.8*  8.4*   GFR: Estimated Creatinine Clearance: 103.4 mL/min (by C-G formula based on SCr of 0.65 mg/dL). Liver Function Tests: Recent Labs  Lab 06/21/20 0853 06/23/20 1458 06/25/20 0117  AST 168* 194* 166*  ALT 152* 164* 143*  ALKPHOS 91 107 126  BILITOT 1.5* 1.2 1.5*  PROT 5.9* 6.4* 5.7*  ALBUMIN 3.2* 3.4* 3.1*   No results for input(s): LIPASE, AMYLASE in the last 168 hours. No results for input(s): AMMONIA in the last 168 hours. Coagulation Profile: No results for input(s): INR,  PROTIME in the last 168 hours. Cardiac Enzymes: No results for input(s): CKTOTAL, CKMB, CKMBINDEX, TROPONINI in the last 168 hours. BNP (last 3 results) No results for input(s): PROBNP in the last 8760 hours. HbA1C: No results for input(s): HGBA1C in the last 72 hours. CBG: No results for input(s): GLUCAP in the last 168 hours. Lipid Profile: No results for input(s): CHOL, HDL, LDLCALC, TRIG, CHOLHDL, LDLDIRECT in the last 72 hours. Thyroid Function Tests: No results for input(s): TSH, T4TOTAL, FREET4, T3FREE, THYROIDAB in the last 72 hours. Anemia Panel: No results for input(s): VITAMINB12, FOLATE, FERRITIN, TIBC, IRON, RETICCTPCT in the last 72 hours. Sepsis Labs: No results for input(s): PROCALCITON, LATICACIDVEN in the last 168 hours.  No results found for this or any previous visit (from the past 240 hour(s)).    Radiology Studies: US PELVIC COMPLETE W TRANSVAGINAL AND TORSION R/O  Result Date: 06/24/2020 CLINICAL DATA:  Pelvic pain for 2 days, uncertain LMP EXAM: TRANSABDOMINAL AND TRANSVAGINAL ULTRASOUND OF PELVIS DOPPLER ULTRASOUND OF OVARIES TECHNIQUE: Both transabdominal and transvaginal ultrasound examinations of the pelvis were performed. Transabdominal technique was performed for global imaging of the pelvis including uterus, ovaries, adnexal regions, and pelvic cul-de-sac. It was necessary to proceed with endovaginal exam following the transabdominal exam to visualize the uterus, endometrium, and ovaries. Color and duplex Doppler ultrasound was utilized to evaluate blood flow to the ovaries. COMPARISON:  None FINDINGS: Uterus Measurements: 8.3 x 4.6 x 5.4 cm = volume: 108 mL. Variable position, flipped from anteverted to retroverted during exam. No focal mass. Endometrium Thickness: 14 mm.  No endometrial fluid or focal abnormality Right ovary Measurements: 3.4 x 2.9 x 2.6 cm = volume: 13.2 mL. Normal morphology without mass. Blood flow present within RIGHT ovary on color Doppler  imaging. Left ovary Measurements: 2.3 x 1.5 x 1.2 cm = volume: 2.3 mL. Normal morphology without mass. Blood flow present within LEFT ovary on color Doppler imaging. Pulsed Doppler evaluation of both ovaries demonstrates normal low-resistance arterial and venous waveforms. Other findings No free pelvic fluid.  No adnexal masses. IMPRESSION: Normal exam. No evidence of ovarian mass or torsion. Electronically Signed   By: Ulyses Southward M.D.   On: 06/24/2020 10:13    Scheduled Meds: . carbamazepine  200 mg Oral TID  . citalopram  40 mg Oral Daily  . clonazePAM  1 mg Oral Q6H  . docusate sodium  100 mg Oral BID  . feeding supplement  237 mL Oral BID BM  . melatonin  10 mg Oral QHS  . mirtazapine  30 mg Oral QHS  . multivitamin with minerals  1 tablet Oral Daily  . naltrexone  25 mg Oral Daily  . pantoprazole  40 mg Oral Daily  . polyethylene glycol  17 g Oral BID  . vitamin B-6  100 mg Oral Daily  . thiamine  100 mg Oral Daily  . traZODone  150 mg Oral QHS  . ziprasidone  80 mg Oral  2 times per day   Continuous Infusions:   LOS: 171 days   Time spent: 35 minutes   Shanise Balch Estill Cotta, MD [facility]  If 7PM-7AM, please contact night-coverage [email or web address] 06/25/2020, 11:41 AM

## 2020-06-26 LAB — COMPREHENSIVE METABOLIC PANEL
ALT: 147 U/L — ABNORMAL HIGH (ref 0–44)
AST: 164 U/L — ABNORMAL HIGH (ref 15–41)
Albumin: 3.3 g/dL — ABNORMAL LOW (ref 3.5–5.0)
Alkaline Phosphatase: 112 U/L (ref 38–126)
Anion gap: 7 (ref 5–15)
BUN: 11 mg/dL (ref 6–20)
CO2: 27 mmol/L (ref 22–32)
Calcium: 9 mg/dL (ref 8.9–10.3)
Chloride: 104 mmol/L (ref 98–111)
Creatinine, Ser: 0.65 mg/dL (ref 0.44–1.00)
GFR, Estimated: >60 mL/min (ref >60)
Glucose, Bld: 123 mg/dL — ABNORMAL HIGH (ref 70–99)
Potassium: 4 mmol/L (ref 3.5–5.1)
Sodium: 138 mmol/L (ref 135–145)
Total Bilirubin: 1.5 mg/dL — ABNORMAL HIGH (ref 0.3–1.2)
Total Protein: 5.8 g/dL — ABNORMAL LOW (ref 6.5–8.1)

## 2020-06-26 NOTE — Progress Notes (Addendum)
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  Patient presented to [facility], ER in [place] several times prior to admission.  Initial presentation was related to symptoms from acute cystitis.  Two other presentations related to insomnia.  On those occasions she was able to be discharged from the ER.  She returned again on 13 September being brought in by family for altered mental status.  After admission this patient developed seizure activity which progressed to status epilepticus therefore she was transferred to [facility] for further neurological evaluation.  After several days recurrent seizures were aborted with combination of 3 antiepileptic drugs.  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  Fortunately this did not improve patient's mentation and she continued to have waxing and waning episodes of alertness and encephalopathy.  Since admission she has not had any further seizures so these medications were discontinued.  She has eventually awakened behaviors alternating between awake and appropriate, awake crying and depressed occasionally delusional, severe agitation and aggression with significant delusions.  She has been evaluated both by neurology and psychiatry who suspect a combination of sequelae from underlying brain injury as well as bipolar disorder with schizoaffective features.  During one of her alert phases patient did admit to being diagnosed with bipolar disease prior to admission.  Patient is on multiple psychotropic medications continued to have episodes of violent outbursts.  She had required frequent doses of IM Geodon as well as  Recruitment consultant and frequent intervention by security staff during her episodes of severe agitation.  Recent chart review by [facility] states patient would not be a candidate for ECT given recent brain injury sequela as well as presentation with status epilepticus.  Has been stable regarding extreme agitation and/or violent behaviors since 1/24 when she had received her last Geodon IM dose.  On the evening of 2/4 she was also given a dose of IM Geodon.  There was no chart documentation stating what type of behavior she was experiencing but typically this medication is only given if she has extreme agitation.  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 -Continue Tegretol 200 mg p.o. 3 times daily, Celexa 10 mg daily, naltrexone 25 mg daily, melatonin and trazodone -Continue Klonopin-consider tapering as she is on 1 mg p.o. every 6 hours.  Elevated liver enzymes in context of hepatitis C -Liver enzymes remains elevated.  Right upper quadrant ultrasound: WNL.  Continue to monitor  Severe insomnia -Continue Remeron and trazodone   Recurrent back pain -Continue ibuprofen and Robaxin oral as needed  History of polysubstance abuse with heroin/history of outpatient methadone treatment -Continue naltrexone to treat heroin addiction as well as recent issues with impulsivity and hypersexual behaviors during admission which are  suspected related to brain injury  and worsening psychiatric condition   Depression and severe anxiety disorder/PTSD/no bipolar disorder now with schizoaffective features --continue IVC (last updated 3/1)  Recurrent constipation/nausea -Continue Colace and MiraLAX  Chest pain secondary to reflux -Cont Protonix daily  Pelvic pain -UA negative for infection, pelvic ultrasound: Unremarkable -Increase p.o. hydration  Status epilepticus -Resolved -Suspect precipitated by abrupt withdrawal of methadone as reported by patient -MRI: Negative for acute findings. Neurology has signed off -Vimpat, Dilantin and phenobarbital have been tapered and discontinued at recommendation of neurologist  Physical deconditioning -Resolved  Large hemorrhoids. -Resolved -Markedly improved-LD Proctofoam 12/15   DVT prophylaxis: Patient is ambulating  code Status: Full code Family Communication: Recruitment consultant present at bedside.  Plan of care discussed with patient in length and she verbalized understanding and agreed with it. Disposition Plan: To be determined  Consultants:   Neurology  Psychiatry  GI  Procedures:   None  Antimicrobials:   None  Status is: Inpatient   Dispo: The patient is from: Home              [facility]              Patient currently is medically stable to d/c.   Difficult to place patient Yes         Subjective: Patient seen and examined.  Sitter at the bedside.  Patient appears very pleasant.  Took shower this morning.  Tells me that she slept well last night, denies abdominal pain or burning with urination.  Wishes to go up on her Klonopin and wants to get Ambien at nighttime to help with the sleep.  We discussed that she is on maximum dose of Klonopin-she understands and did not say anything afterwards.  Objective: Vitals:   06/25/20 0516 06/25/20 0800 06/25/20 2125 06/26/20 0612  BP: 102/70  108/67 100/66 101/71  Pulse: 60 69 61 60  Resp: 17 18 18 16   Temp: 97.8 F (36.6 C) 98.3 F (36.8 C) 98 F (36.7 C) 98.1 F (36.7 C)  TempSrc: Oral Tympanic Tympanic Oral  SpO2: 98% 97% 98% 98%  Weight: 83 kg   83.2 kg  Height:        Intake/Output Summary (Last 24 hours) at 06/26/2020 1037 Last data filed at 06/26/2020 0812 Gross per 24 hour  Intake 1410 ml  Output --  Net 1410 ml   Filed Weights   06/23/20 0500 06/25/20 0516 06/26/20 0612  Weight: 83 kg 83 kg 83.2 kg    Examination:  General exam: Appears calm and comfortable, on room air,  very interactive this morning, makes eye contact throughout the encounter.   Respiratory system: Clear to auscultation. Respiratory effort normal. Cardiovascular system: S1 & S2 heard, RRR. No JVD, murmurs, rubs, gallops or clicks. No pedal edema. Gastrointestinal system: Abdomen is nondistended, soft and nontender. No organomegaly or masses felt. Normal bowel sounds heard. Central nervous system: Alert and oriented. No focal neurological deficits. Extremities: Symmetric 5 x 5 power. Skin: No rashes, lesions or ulcers   Data Reviewed: I have personally reviewed following labs and imaging studies  CBC: Recent Labs  Lab 06/21/20 0853 06/23/20 1458 06/25/20 0117  WBC 2.7* 3.1* 4.0  NEUTROABS 1.0*  --   --   HGB 12.9 13.0 12.7  HCT 37.1 39.0 38.4  MCV 92.5 94.9 95.8  PLT 278 262 254   Basic Metabolic Panel: Recent Labs  Lab 06/21/20 0853 06/23/20 1458 06/25/20 0117 06/26/20 0051  NA 137  139 137 138  K 3.9 4.4 3.8 4.0  CL 100 106 105 104  CO2 26 25 24 27   GLUCOSE 182* 127* 156* 123*  BUN 14 14 13 11   CREATININE 0.76 0.79 0.65 0.65  CALCIUM 8.9 8.8* 8.4* 9.0   GFR: Estimated Creatinine Clearance: 103.6 mL/min (by C-G formula based on SCr of 0.65 mg/dL). Liver Function Tests: Recent Labs  Lab 06/21/20 0853 06/23/20 1458 06/25/20 0117 06/26/20 0051  AST 168* 194* 166* 164*  ALT 152* 164* 143* 147*  ALKPHOS 91  107 126 112  BILITOT 1.5* 1.2 1.5* 1.5*  PROT 5.9* 6.4* 5.7* 5.8*  ALBUMIN 3.2* 3.4* 3.1* 3.3*   No results for input(s): LIPASE, AMYLASE in the last 168 hours. No results for input(s): AMMONIA in the last 168 hours. Coagulation Profile: No results for input(s): INR, PROTIME in the last 168 hours. Cardiac Enzymes: No results for input(s): CKTOTAL, CKMB, CKMBINDEX, TROPONINI in the last 168 hours. BNP (last 3 results) No results for input(s): PROBNP in the last 8760 hours. HbA1C: No results for input(s): HGBA1C in the last 72 hours. CBG: No results for input(s): GLUCAP in the last 168 hours. Lipid Profile: No results for input(s): CHOL, HDL, LDLCALC, TRIG, CHOLHDL, LDLDIRECT in the last 72 hours. Thyroid Function Tests: No results for input(s): TSH, T4TOTAL, FREET4, T3FREE, THYROIDAB in the last 72 hours. Anemia Panel: No results for input(s): VITAMINB12, FOLATE, FERRITIN, TIBC, IRON, RETICCTPCT in the last 72 hours. Sepsis Labs: No results for input(s): PROCALCITON, LATICACIDVEN in the last 168 hours.  No results found for this or any previous visit (from the past 240 hour(s)).    Radiology Studies: No results found.  Scheduled Meds: . carbamazepine  200 mg Oral TID  . citalopram  40 mg Oral Daily  . clonazePAM  1 mg Oral Q6H  . docusate sodium  100 mg Oral BID  . feeding supplement  237 mL Oral BID BM  . melatonin  10 mg Oral QHS  . mirtazapine  30 mg Oral QHS  . multivitamin with minerals  1 tablet Oral Daily  . naltrexone  25 mg Oral Daily  . pantoprazole  40 mg Oral Daily  . polyethylene glycol  17 g Oral BID  . vitamin B-6  100 mg Oral Daily  . thiamine  100 mg Oral Daily  . traZODone  150 mg Oral QHS  . ziprasidone  80 mg Oral 2 times per day   Continuous Infusions:   LOS: 172 days   Time spent: 35 minutes   Quintrell Baze 08/25/20, MD [facility]  If 7PM-7AM, please contact night-coverage [email or web address] 06/26/2020, 10:37 AM

## 2020-06-26 NOTE — Progress Notes (Signed)
Pt.s family member is visiting pt. Brought pt food, pt visibly happy for having visitors today. Will continue to monitor pt.

## 2020-06-26 NOTE — Progress Notes (Addendum)
3832: Pt has awoken this morning in a good mood. Pt has take a shower, cleaned her room and is currently doing her nails all while listening to music. Will continue to monitor pt.  0845: Pt wanted to go for a walk out on the hallway when approached near the secretary, pt asked if they could go for a walk. This Clinical research associate was informed that as long as the pt is in a good mood, is herself and has not had any outbursts then pt can go for a walk. This Clinical research associate and pt walked outside of the unit to the lobby and pt asked if they could go outside for a fresh breeze.  We passed the piano and sat down to listen to the music playing.  This Clinical research associate took pt out to the back hospital where flowers are resided. Pt grabbed a handful of flowers. Then walked to the front of the hospital where the pt commented: "I miss my kids. I really do. I want to see them" and began crying. This Clinical research associate offered support to pt, this Clinical research associate steered their mind to other thoughts and pt voiced that they plan on finishing school for Psychology to become a Substance Abuse Counselor. Once pt was done, walked back into home unit and pt now in her room.  Pt remained calm and cooperative during entire walk.  Will continue to monitor pt.

## 2020-06-27 NOTE — Progress Notes (Signed)
CSW spoke with Delorise Royals of [facility] [employer] who states the patient's Medicaid has been approved. Patient's subscriber ID is [id number] L.  Edwin Dada, MSW, LCSW Transitions of Care  Clinical Social Worker II [phone]

## 2020-06-27 NOTE — Progress Notes (Signed)
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  Patient presented to [facility], ER in [place] several times prior to admission.  Initial presentation was related to symptoms from acute cystitis.  Two other presentations related to insomnia.  On those occasions she was able to be discharged from the ER.  She returned again on 13 September being brought in by family for altered mental status.  After admission this patient developed seizure activity which progressed to status epilepticus therefore she was transferred to [facility] for further neurological evaluation.  After several days recurrent seizures were aborted with combination of 3 antiepileptic drugs.  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  Fortunately this did not improve patient's mentation and she continued to have waxing and waning episodes of alertness and encephalopathy.  Since admission she has not had any further seizures so these medications were discontinued.  She has eventually awakened behaviors alternating between awake and appropriate, awake crying and depressed occasionally delusional, severe agitation and aggression with significant delusions.  She has been evaluated both by neurology and psychiatry who suspect a combination of sequelae from underlying brain injury as well as bipolar disorder with schizoaffective features.  During one of her alert phases patient did admit to being diagnosed with bipolar disease prior to admission.  Patient is on multiple psychotropic medications continued to have episodes of violent outbursts.  She had required frequent doses of IM Geodon as well as  Recruitment consultant and frequent intervention by security staff during her episodes of severe agitation.  Recent chart review by [facility] states patient would not be a candidate for ECT given recent brain injury sequela as well as presentation with status epilepticus.  Has been stable regarding extreme agitation and/or violent behaviors since 1/24 when she had received her last Geodon IM dose.  On the evening of 2/4 she was also given a dose of IM Geodon.  There was no chart documentation stating what type of behavior she was experiencing but typically this medication is only given if she has extreme agitation.  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 -Continue Tegretol 200 mg p.o. 3 times daily, Celexa 10 mg daily, naltrexone 25 mg daily, melatonin and trazodone -Continue Klonopin-consider tapering as she is on 1 mg p.o. every 6 hours.  Elevated liver enzymes in context of hepatitis C -Liver enzymes remains elevated.  Right upper quadrant ultrasound: WNL.  Continue to monitor  Severe insomnia -Continue Remeron and trazodone   Recurrent back pain -Continue ibuprofen and Robaxin oral as needed  History of polysubstance abuse with heroin/history of outpatient methadone treatment -Continue naltrexone to treat heroin addiction as well as recent issues with impulsivity and hypersexual behaviors during admission which are  suspected related to brain injury  and worsening psychiatric condition   Depression and severe anxiety disorder/PTSD/no bipolar disorder now with schizoaffective features --continue IVC (last updated 3/1)  Recurrent constipation/nausea -Continue Colace and MiraLAX  Chest pain secondary to reflux -Cont Protonix daily  Pelvic pain -UA negative for infection, pelvic ultrasound: Unremarkable -Increase p.o. hydration  Status epilepticus -Resolved -Suspect precipitated by abrupt withdrawal of methadone as reported by patient -MRI: Negative for acute findings. Neurology has signed off -Vimpat, Dilantin and phenobarbital have been tapered and discontinued at recommendation of neurologist  Physical deconditioning -Resolved  Large hemorrhoids. -Resolved -Markedly improved-LD Proctofoam 12/15   DVT prophylaxis: Patient is ambulating  code Status: Full code Family Communication: Recruitment consultant present at bedside.  Plan of care discussed with patient in length and she verbalized understanding and agreed with it. Disposition Plan: To be determined  Consultants:   Neurology  Psychiatry  GI  Procedures:   None  Antimicrobials:   None  Status is: Inpatient   Dispo: The patient is from: Home              [facility]              Patient currently is medically stable to d/c.   Difficult to place patient Yes    Subjective: Patient seen and examined.  Sitter at the bedside.  Reports some mild lower abdominal pain however denies burning with urination.  No any other complaints.  Slept okay last night.  No acute events overnight.  Objective: Vitals:   06/25/20 2125 06/26/20 0612 06/26/20 2135 06/27/20 0515  BP: 100/66 101/71 105/61 102/66  Pulse: 61 60 (!) 55 (!) 57  Resp: 18 16 18 17   Temp: 98 F (36.7 C) 98.1 F (36.7 C) 98 F (36.7 C) 97.7 F (36.5 C)  TempSrc: Tympanic Oral Oral Oral  SpO2: 98% 98% 98% 98%   Weight:  83.2 kg  83.5 kg  Height:        Intake/Output Summary (Last 24 hours) at 06/27/2020 1134 Last data filed at 06/27/2020 0552 Gross per 24 hour  Intake 660 ml  Output --  Net 660 ml   Filed Weights   06/25/20 0516 06/26/20 0612 06/27/20 0515  Weight: 83 kg 83.2 kg 83.5 kg    Examination:  General exam: Appears calm and comfortable, on room air, has flat affect and monotonic speech this morning. Respiratory system: Clear to auscultation. Respiratory effort normal. Cardiovascular system: S1 & S2 heard, RRR. No JVD, murmurs, rubs, gallops or clicks. No pedal edema. Gastrointestinal system: Abdomen is nondistended, soft and nontender. No organomegaly or masses felt. Normal bowel sounds heard. Central nervous system: Alert and oriented. No focal neurological deficits. Extremities: Symmetric 5 x 5 power. Skin: No rashes, lesions or ulcers   Data Reviewed: I have personally reviewed following labs and imaging studies  CBC: Recent Labs  Lab 06/21/20 0853 06/23/20 1458 06/25/20 0117  WBC 2.7* 3.1* 4.0  NEUTROABS 1.0*  --   --   HGB 12.9 13.0 12.7  HCT 37.1 39.0 38.4  MCV 92.5 94.9 95.8  PLT 278 262 254   Basic Metabolic Panel: Recent Labs  Lab 06/21/20 0853 06/23/20 1458 06/25/20 0117 06/26/20 0051  NA 137 139 137 138  K 3.9 4.4 3.8 4.0  CL 100 106 105 104  CO2 26 25 24 27   GLUCOSE 182* 127* 156* 123*  BUN 14 14 13 11   CREATININE 0.76 0.79 0.65 0.65  CALCIUM 8.9 8.8* 8.4* 9.0  GFR: Estimated Creatinine Clearance: 103.7 mL/min (by C-G formula based on SCr of 0.65 mg/dL). Liver Function Tests: Recent Labs  Lab 06/21/20 0853 06/23/20 1458 06/25/20 0117 06/26/20 0051  AST 168* 194* 166* 164*  ALT 152* 164* 143* 147*  ALKPHOS 91 107 126 112  BILITOT 1.5* 1.2 1.5* 1.5*  PROT 5.9* 6.4* 5.7* 5.8*  ALBUMIN 3.2* 3.4* 3.1* 3.3*   No results for input(s): LIPASE, AMYLASE in the last 168 hours. No results for input(s): AMMONIA in the last 168  hours. Coagulation Profile: No results for input(s): INR, PROTIME in the last 168 hours. Cardiac Enzymes: No results for input(s): CKTOTAL, CKMB, CKMBINDEX, TROPONINI in the last 168 hours. BNP (last 3 results) No results for input(s): PROBNP in the last 8760 hours. HbA1C: No results for input(s): HGBA1C in the last 72 hours. CBG: No results for input(s): GLUCAP in the last 168 hours. Lipid Profile: No results for input(s): CHOL, HDL, LDLCALC, TRIG, CHOLHDL, LDLDIRECT in the last 72 hours. Thyroid Function Tests: No results for input(s): TSH, T4TOTAL, FREET4, T3FREE, THYROIDAB in the last 72 hours. Anemia Panel: No results for input(s): VITAMINB12, FOLATE, FERRITIN, TIBC, IRON, RETICCTPCT in the last 72 hours. Sepsis Labs: No results for input(s): PROCALCITON, LATICACIDVEN in the last 168 hours.  No results found for this or any previous visit (from the past 240 hour(s)).    Radiology Studies: No results found.  Scheduled Meds: . carbamazepine  200 mg Oral TID  . citalopram  40 mg Oral Daily  . clonazePAM  1 mg Oral Q6H  . docusate sodium  100 mg Oral BID  . feeding supplement  237 mL Oral BID BM  . melatonin  10 mg Oral QHS  . mirtazapine  30 mg Oral QHS  . multivitamin with minerals  1 tablet Oral Daily  . naltrexone  25 mg Oral Daily  . pantoprazole  40 mg Oral Daily  . polyethylene glycol  17 g Oral BID  . vitamin B-6  100 mg Oral Daily  . thiamine  100 mg Oral Daily  . traZODone  150 mg Oral QHS  . ziprasidone  80 mg Oral 2 times per day   Continuous Infusions:   LOS: 173 days   Time spent: 35 minutes   Yasmeen Manka Estill Cotta, MD [facility]  If 7PM-7AM, please contact night-coverage [email or web address] 06/27/2020, 11:34 AM

## 2020-06-28 ENCOUNTER — Other Ambulatory Visit: Payer: Self-pay | Admitting: Nurse Practitioner

## 2020-06-28 DIAGNOSIS — S069XAA Unspecified intracranial injury with loss of consciousness status unknown, initial encounter: Secondary | ICD-10-CM | POA: Diagnosis present

## 2020-06-28 DIAGNOSIS — F314 Bipolar disorder, current episode depressed, severe, without psychotic features: Secondary | ICD-10-CM | POA: Diagnosis present

## 2020-06-28 DIAGNOSIS — S069X9A Unspecified intracranial injury with loss of consciousness of unspecified duration, initial encounter: Secondary | ICD-10-CM | POA: Diagnosis present

## 2020-06-28 LAB — COMPREHENSIVE METABOLIC PANEL
ALT: 154 U/L — ABNORMAL HIGH (ref 0–44)
AST: 206 U/L — ABNORMAL HIGH (ref 15–41)
Albumin: 3.3 g/dL — ABNORMAL LOW (ref 3.5–5.0)
Alkaline Phosphatase: 99 U/L (ref 38–126)
Anion gap: 10 (ref 5–15)
BUN: 15 mg/dL (ref 6–20)
CO2: 24 mmol/L (ref 22–32)
Calcium: 8.8 mg/dL — ABNORMAL LOW (ref 8.9–10.3)
Chloride: 103 mmol/L (ref 98–111)
Creatinine, Ser: 0.68 mg/dL (ref 0.44–1.00)
GFR, Estimated: >60 mL/min (ref >60)
Glucose, Bld: 96 mg/dL (ref 70–99)
Potassium: 3.8 mmol/L (ref 3.5–5.1)
Sodium: 137 mmol/L (ref 135–145)
Total Bilirubin: 1.8 mg/dL — ABNORMAL HIGH (ref 0.3–1.2)
Total Protein: 5.8 g/dL — ABNORMAL LOW (ref 6.5–8.1)

## 2020-06-28 MED ORDER — CLONAZEPAM 0.5 MG PO TABS
0.5000 mg | ORAL_TABLET | Freq: Four times a day (QID) | ORAL | Status: DC
  Administered 2020-06-28 – 2020-07-02 (×15): 0.5 mg via ORAL
  Filled 2020-06-28 (×16): qty 1

## 2020-06-28 MED ORDER — CLONAZEPAM 1 MG PO TABS: 1.0000 mg | ORAL_TABLET | Freq: Four times a day (QID) | ORAL | 0 refills | Status: DC

## 2020-06-28 MED ORDER — POLYETHYLENE GLYCOL 3350 17 G PO PACK: 17.0000 g | PACK | Freq: Two times a day (BID) | ORAL | 0 refills | Status: DC

## 2020-06-28 MED ORDER — TRAZODONE HCL 150 MG PO TABS: 150.0000 mg | ORAL_TABLET | Freq: Every day | ORAL | 0 refills | Status: DC

## 2020-06-28 MED ORDER — METHOCARBAMOL 500 MG PO TABS: 500.0000 mg | ORAL_TABLET | Freq: Four times a day (QID) | ORAL | 0 refills | Status: DC | PRN

## 2020-06-28 MED ORDER — CITALOPRAM HYDROBROMIDE 40 MG PO TABS: 40.0000 mg | ORAL_TABLET | Freq: Every day | ORAL | 0 refills | Status: DC

## 2020-06-28 MED ORDER — NALTREXONE HCL 50 MG PO TABS: 25.0000 mg | ORAL_TABLET | Freq: Every day | ORAL | 0 refills | Status: DC

## 2020-06-28 MED ORDER — PYRIDOXINE HCL 100 MG PO TABS: 100.0000 mg | ORAL_TABLET | Freq: Every day | ORAL | 0 refills | Status: DC

## 2020-06-28 MED ORDER — PANTOPRAZOLE SODIUM 40 MG PO TBEC: 40.0000 mg | DELAYED_RELEASE_TABLET | Freq: Every day | ORAL | 0 refills | Status: DC

## 2020-06-28 MED ORDER — CARBAMAZEPINE 200 MG PO TABS: 200.0000 mg | ORAL_TABLET | Freq: Three times a day (TID) | ORAL | 0 refills | Status: DC

## 2020-06-28 MED ORDER — ADULT MULTIVITAMIN W/MINERALS CH: 1.0000 | ORAL_TABLET | Freq: Every day | ORAL | Status: DC

## 2020-06-28 MED ORDER — MELATONIN 10 MG PO TABS: 10.0000 mg | ORAL_TABLET | Freq: Every day | ORAL | 0 refills | Status: DC

## 2020-06-28 MED ORDER — THIAMINE HCL 100 MG PO TABS: 100.0000 mg | ORAL_TABLET | Freq: Every day | ORAL | 0 refills | Status: DC

## 2020-06-28 MED ORDER — CLONAZEPAM 0.5 MG PO TABS: 0.5000 mg | ORAL_TABLET | Freq: Four times a day (QID) | ORAL | 0 refills | Status: DC

## 2020-06-28 MED ORDER — ZIPRASIDONE HCL 80 MG PO CAPS: 80.0000 mg | ORAL_CAPSULE | Freq: Two times a day (BID) | ORAL | 0 refills | Status: DC

## 2020-06-28 MED ORDER — MIRTAZAPINE 30 MG PO TABS: 30.0000 mg | ORAL_TABLET | Freq: Every day | ORAL | 0 refills | Status: DC

## 2020-06-28 MED ORDER — DOCUSATE SODIUM 100 MG PO CAPS: 100.0000 mg | ORAL_CAPSULE | Freq: Two times a day (BID) | ORAL | 0 refills | Status: DC

## 2020-06-28 MED FILL — clonazePAM 0.5 MG TABS: 0.5 | 30 days supply | Qty: 120 | Fill #0

## 2020-06-28 MED FILL — CITALOPRAM HBR 40 MG TABLET: 40 | 30 days supply | Qty: 30 | Fill #0

## 2020-06-28 MED FILL — PANTOPRAZOLE SOD DR 40 MG T: 40 | 30 days supply | Qty: 30 | Fill #0

## 2020-06-28 MED FILL — METHOCARBAMOL 500 MG TABS: 500 | 7 days supply | Qty: 30 | Fill #0

## 2020-06-28 NOTE — Progress Notes (Addendum)
[facility] PROGRESS NOTE  Anita Davis UEA:[id number] DOB: Oct 01, 1985 DOA: 01/04/2020 PCP: [name], PA-C    02/10/20                      02/15/20                       04/08/20    04/01/21  Status: Remains inpatient appropriate because:Altered mental status, Unsafe d/c plan and Inpatient level of care appropriate due to severity of illness   Dispo: The patient is from: Home              [facility]              Anticipated d/c date is: > 3 days              Patient currently is medically stable to d/c.  Barriers to discharge: Significant aggressive and violent outbursts have improved w/ no episodes sine 1/24. Remains on priority list for [facility].  Her father and stepmother now have guardianship of this patient and are planning to bring a copy of this documentation to the hospital. Next IVC due 3/8    Code Status: DNR Family Communication: Stepmother Anita Davis 3/8-explained in detail new discharge plan as outlined to [facility] (see below)-explained to her that Anita Davis is no longer eligible for [facility] given that her behaviors are now well controlled with medications.  Discussed that home environment would likely be the best for [facility] emphasizing that if Anita Davis were discharged to [facility] that belongings in room including crafting supplies, nail polish, beauty supplies etc. would likely be limited or removed and visitation would be very strict.  Also discussed with [name] that psychiatric team would be reaching out to them to schedule a meeting in regards to discussing discharge plans related to psychiatric care and a safety plan. 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  Patient presented to [facility] ER in [place] several times prior to admission.  Initial presentation was related to symptoms from acute cystitis.  Two other presentations related to insomnia.  On those occasions she was able to be discharged from the ER.  She returned again on 13 September being brought in by family for altered mental status.  After admission this patient developed seizure activity which progressed to status epilepticus therefore she was transferred to [facility] for further neurological evaluation.  After several days recurrent seizures were aborted with combination of 3 antiepileptic drugs.  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  Fortunately this did not improve patient's mentation and she continued to have waxing and waning episodes of alertness and encephalopathy.  Since admission she has not had any further seizures so these medications were discontinued.  She has eventually awakened behaviors alternating between awake and appropriate, awake crying and depressed occasionally delusional, severe agitation and aggression with significant delusions.  She has been evaluated both by neurology and psychiatry who suspect a combination of sequelae from underlying brain injury as well as bipolar disorder with schizoaffective features.  During one of her alert phases patient did admit to being diagnosed with bipolar  disease prior to admission.  Patient is on multiple psychotropic medications continued to have episodes of violent outbursts.  She had required frequent doses of IM Geodon as well as Recruitment consultant and frequent intervention by security staff during her episodes of severe agitation.  Recent chart review by [facility] states patient would not be a candidate for ECT given recent brain injury sequela as well as presentation with status epilepticus.  Has been  stable regarding extreme agitation and/or violent behaviors since 1/24 when she had received her last Geodon IM dose.  On the evening of 2/4 she was also given a dose of IM Geodon.  There was no chart documentation stating what type of behavior she was experiencing but typically this medication is only given if she has extreme agitation.  Patient has been followed regularly during the hospitalization by our inpatient psychiatric team.  As of 3/4 the team determined that there would be no benefit in transferring patient to [facility] given her extremes in behavior have resolved.  Decision made at that time to allow IVC to expire on 3/8 and to remove sitter from bedside.  Plan at this time is to discharge patient to home environment.  Patient now has active Medicaid in [facility] [place] [facility] and this would allow her to obtain appropriate medications at discharge.  Psychiatric team plans to speak with patient's father and stepmother who are also her guardians to aid in establishing a safety and other discharge plan regarding her psychiatric illness.  Because of her underlying brain injury from status epilepticus she will have an ambulatory referral placed to [facility] so she can undergo neurocognitive evaluation.  Because there is a lack of psychiatric services in the [place] area/[place] area our plan is to have patient follow-up with psychiatrist in the [place] area.  We have asked the psychiatric team to assist in arranging an appointment since this patient likely would need rapid follow-up after discharge i.e. within 2 weeks after discharge.  Subjective: Awake and alert.  Sitting up in bed rearranging puzzle pieces.  When asked, she stated that she had not been approached regarding change in discharge plan.  I discussed at length with her plans to discharge her home but multiple issues need to be clarified and put into place before she is discharged.  She has been informed she will need a  neurocognitive evaluation and appointment will be arranged for this.  I was careful to emphasize that this is related to her prior brain injury and that she is not "stupid" since in the past from a psychiatric standpoint she has been fixated on this particular issue.  I also explained to her that any visitation from her friends would be at the discretion of her guardians who she would be living with but is recommended that she no longer socialize with her friends including [name] and [name] given her history of heroin abuse.  Explained to patient that with her current brain injury and fragile psychological status that introducing illicit substances into the mix would be dangerous.  I also explained to the patient that she would need to follow-up with a psychiatrist after discharge to assist in management of her medications.  She has also been informed that her Medicaid is now active.  I also explained to her why she is no longer a candidate for placement at [facility].  Objective: Vitals:   06/27/20 2028 06/28/20 0604  BP: 115/72 113/74  Pulse: (!) 56 (!) 58  Resp: 16  16  Temp: 98 F (36.7 C) 97.9 F (36.6 C)  SpO2: 99% 100%    Intake/Output Summary (Last 24 hours) at 06/28/2020 0811 Last data filed at 06/27/2020 2030 Gross per 24 hour  Intake 420 ml  Output --  Net 420 ml   Filed Weights   06/25/20 0516 06/26/20 0612 06/27/20 0515  Weight: 83 kg 83.2 kg 83.5 kg    Exam: General: Alert with continued flat affect, no acute distress Pulmonary:  Lungs are clear and she is stable on room air Cardiac: Normal heart sounds, no peripheral edema, pulse regular Abdomen: Soft nontender.  Tolerating diet.  Normoactive bowel sounds.  LBM 3/7 Neurological:  Cranial nerves 2-12 intact, no focal neurological deficits and ambulates without difficulty Psychiatric: Awake, somewhat flat affect but improved when inform her discharge disposition have been changed to home.  She is oriented x3 and currently not  experiencing any delusional thoughts or behaviors.   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 During periods of alertness and lucidity patient denied use of methamphetamines prior to admission although did admit to abruptly stopping her methadone -Remains intermittently oriented with episodes of delusion and lack of insight into her current psychiatric status -3/4 kg pending in regards to following QTC -Continue max dose Geodon 80 mg BID, naltrexone, scheduled Klonopin, Tegretol, Celexa and Remeron at hour of sleep -Continue Tegretol to 200 mg 3 times daily.  Carbamazepine level still slightly elevated at 12.9 with high normal B12; repeat labs in a.m. on 3/9 -On 3/4 was reevaluated by psychiatric team who now recommend discharge to home and allow current IVC to expire and discontinuation of safety sitter. -We will go ahead and complete discharge medicines and sent to [facility] pharmacy to determine out-of-pocket cost. -We will also need to obtain home pharmacy so prescriptions can be faxed there when refills are needed.  Worsening transaminitis in context of hepatitis C -Elevated HCV antibody with markedly elevated HCV RNA quantitative level  consistent with chronic hepatitis C  -11/10: Discussed with ID Dr. Manson Passey. Treatment of hepatitis C typically is initiated in the outpatient setting. Medications can be quite expensive and usually take time to obtain insurance approval.  -ID recommends scheduling outpatient appointment  their clinic closer to discharge date -Nominal ultrasound unremarkable -Because of upward trend in LFTs Tegretol dosage has been tapered down from 800 mg 3 times daily to 200 mg 3 times daily with last dosage adjustment on 3/3. -Repeat LFTs 3/3 revealed slight increase in AST and ALT but normalization of TB  Severe insomnia -Continue Remeron and trazodone   Recurrent back pain -Initiate ibuprofen and Robaxin oral as needed  History of polysubstance abuse with heroin/history of outpatient methadone treatment -Prescribed methadone 60 mg daily from a clinic in [place] prior to admission -Continue naltrexone to treat heroin addiction as well as recent issues with impulsivity and hypersexual behaviors during admission which are suspected related to brain injury and worsening psychiatric condition   Depression and severe anxiety disorder/PTSD/no bipolar disorder now with schizoaffective features -Extremes in behavior have significantly improved-continue IVC (last updated 3/1) -See above regarding pharmacotherapy  Recurrent constipation/nausea -Continue Colace 100  mg twice daily prn -Continue MiraLAX to twice daily - has had multiple bowel movements since initiation of scheduled lactulose-lactulose could be contributing to ongoing nausea therefore will change to prn -As a precaution with her history of recurrent UTI will check urinalysis and culture to ensure nausea not secondary to UTI  Chest pain secondary to reflux -Cont Protonix daily -Ck EKG (need to ck Qtc 2/2 Geodon)  Pelvic pain -Reported to staff as 10/10 -ck UA -? Menstrual etiology-patient cannot remember when her last menstrual cycle occurred -Pelvic ultrasound unremarkable and symptoms seem to improve after voiding    Other problems: Status epilepticus -Resolved -Suspect precipitated by abrupt withdrawal of methadone as reported by patient -Repeat MRI on 12/28 within normal limits and it is felt behavioral issues  related to brain injury sequelae and chronic psychiatric condition.  Neurology has signed off -Vimpat, Dilantin and phenobarbital have been tapered and discontinued at recommendation of neurologist  Back pain 2/2 abnormal urinalysis consistent with UTI -Patient with low-grade fever overnight and is now having back pain for days not responsive to NSAIDs and muscle relaxers -DG lumbar spine unremarkable -During hospitalization patient has been treated for pansensitive E. coli UTI as well as Salmonella UTI both of which have been sensitive to Levaquin -Completed Levaquin 12/20-urine culture obtained after antibiotics initiated and showed no growth -CT abdomen and pelvis on 12/17 unremarkable  Physical deconditioning -Resolved -Does continue to exhibit impulsivity but maintains appropriate balance and gait no longer requires one-to-one safety sitter  Large hemorrhoids. -Resolved -Markedly improved-LD Proctofoam 12/15  Dysphagia -Resolved -Continue regular diet  Back pain 2/2 abnormal urinalysis consistent with UTI/history of Salmonella and E. coli UTI during hospitalization -Patient with low-grade fever overnight and is now having back pain for days not responsive to NSAIDs and muscle relaxers -DG lumbar spine unremarkable -During hospitalization patient has been treated for pansensitive E. coli UTI as well as Salmonella UTI both of which have been sensitive to Levaquin -Completed Levaquin 12/20-urine culture obtained after antibiotics initiated and showed no growth -CT abdomen and pelvis on 12/17 unremarkable  Data Reviewed: Basic Metabolic Panel: Recent Labs  Lab 06/21/20 0853 06/23/20 1458 06/25/20 0117 06/26/20 0051 06/28/20 0032  NA 137 139 137 138 137  K 3.9 4.4 3.8 4.0 3.8  CL 100 106 105 104 103  CO2 26 25 24 27 24   GLUCOSE 182* 127* 156* 123* 96  BUN 14 14 13 11 15   CREATININE 0.76 0.79 0.65 0.65 0.68  CALCIUM 8.9 8.8* 8.4* 9.0 8.8*   Liver Function  Tests: Recent Labs  Lab 06/21/20 0853 06/23/20 1458 06/25/20 0117 06/26/20 0051 06/28/20 0032  AST 168* 194* 166* 164* 206*  ALT 152* 164* 143* 147* 154*  ALKPHOS 91 107 126 112 99  BILITOT 1.5* 1.2 1.5* 1.5* 1.8*  PROT 5.9* 6.4* 5.7* 5.8* 5.8*  ALBUMIN 3.2* 3.4* 3.1* 3.3* 3.3*   No results for input(s): LIPASE, AMYLASE in the last 168 hours. No results for input(s): AMMONIA in the last 168 hours. CBC: Recent Labs  Lab 06/21/20 0853 06/23/20 1458 06/25/20 0117  WBC 2.7* 3.1* 4.0  NEUTROABS 1.0*  --   --   HGB 12.9 13.0 12.7  HCT 37.1 39.0 38.4  MCV 92.5 94.9 95.8  PLT 278 262 254   Cardiac Enzymes: No results for input(s): CKTOTAL, CKMB, CKMBINDEX, TROPONINI in the last 168 hours. BNP (last 3 results) No results for input(s): BNP in the last 8760 hours.  ProBNP (last 3 results) No results for  input(s): PROBNP in the last 8760 hours.  CBG: No results for input(s): GLUCAP in the last 168 hours.  No results found for this or any previous visit (from the past 240 hour(s)).   Studies: No results found.  Scheduled Meds: . carbamazepine  200 mg Oral TID  . citalopram  40 mg Oral Daily  . clonazePAM  1 mg Oral Q6H  . docusate sodium  100 mg Oral BID  . feeding supplement  237 mL Oral BID BM  . melatonin  10 mg Oral QHS  . mirtazapine  30 mg Oral QHS  . multivitamin with minerals  1 tablet Oral Daily  . naltrexone  25 mg Oral Daily  . pantoprazole  40 mg Oral Daily  . polyethylene glycol  17 g Oral BID  . vitamin B-6  100 mg Oral Daily  . thiamine  100 mg Oral Daily  . traZODone  150 mg Oral QHS  . ziprasidone  80 mg Oral 2 times per day   Continuous Infusions:   Principal Problem:   Organic brain syndrome Active Problems:   Polysubstance abuse (HCC)   Transaminitis   Refractory seizure (HCC)   Chronic hepatitis C without hepatic coma (HCC)   Distended abdomen   Palliative care encounter   Colonic Ileus (HCC)   Inadequate oral nutritional intake    Impulse disorder   Consultants:  Neurology  Psychiatry  Interventional radiology  Surgery    Procedures:  9/14 lumbar puncture  9/15 EEG  9/16 EEG  9/17 EEG  9/19 overnight EEG with video  9/22 overnight EEG with video with discontinuation of long-term EEG monitoring on 9/25  9/24 core track placement  10/6 EEG  12/15 PEG tube removed    Antibiotics: Anti-infectives (From admission, onward)   Start     Dose/Rate Route Frequency Ordered Stop   05/11/20 0845  amoxicillin-clavulanate (AUGMENTIN) 500-125 MG per tablet 500 mg        1 tablet Oral Every 8 hours 05/11/20 0750 05/18/20 0559   05/11/20 0845  metroNIDAZOLE (FLAGYL) tablet 500 mg        500 mg Oral Every 8 hours 05/11/20 0750 05/18/20 0559   04/05/20 1415  levofloxacin (LEVAQUIN) tablet 500 mg        500 mg Oral Daily 04/05/20 1327 04/11/20 0911   03/19/20 1000  metroNIDAZOLE (FLAGYL) tablet 500 mg  Status:  Discontinued        500 mg Per Tube 2 times daily 03/19/20 0822 03/23/20 0827   03/18/20 1200  amoxicillin (AMOXIL) 250 MG/5ML suspension 500 mg  Status:  Discontinued        500 mg Per Tube Every 8 hours 03/18/20 0915 03/23/20 0559   03/17/20 1200  cefTRIAXone (ROCEPHIN) 1 g in sodium chloride 0.9 % 100 mL IVPB  Status:  Discontinued        1 g 200 mL/hr over 30 Minutes Intravenous Every 24 hours 03/17/20 1048 03/18/20 0913   03/16/20 1130  metroNIDAZOLE (FLAGYL) 50 mg/ml oral suspension 500 mg  Status:  Discontinued        500 mg Per Tube 2 times daily 03/16/20 1040 03/19/20 0821   03/16/20 1130  fluconazole (DIFLUCAN) 40 MG/ML suspension 152 mg        150 mg Per Tube  Once 03/16/20 1040 03/16/20 1245   02/11/20 1526  ceFAZolin (ANCEF) IVPB 2g/100 mL premix        2 g 200 mL/hr over 30 Minutes Intravenous  Once 02/11/20  1526 02/11/20 1641   02/05/20 0600  ceFAZolin (ANCEF) IVPB 2g/100 mL premix        2 g 200 mL/hr over 30 Minutes Intravenous To Short Stay 02/04/20 1054 02/05/20 0950    01/29/20 0600  ceFAZolin (ANCEF) IVPB 2g/100 mL premix        2 g 200 mL/hr over 30 Minutes Intravenous On call to O.R. 01/28/20 1511 01/30/20 0559   01/28/20 2000  cefTRIAXone (ROCEPHIN) 1 g in sodium chloride 0.9 % 100 mL IVPB        1 g 200 mL/hr over 30 Minutes Intravenous Every 24 hours 01/28/20 1925 02/02/20 0724   01/06/20 0900  cefTRIAXone (ROCEPHIN) 2 g in sodium chloride 0.9 % 100 mL IVPB  Status:  Discontinued        2 g 200 mL/hr over 30 Minutes Intravenous Every 12 hours 01/06/20 0105 01/06/20 2202   01/06/20 0800  vancomycin (VANCOCIN) IVPB 1000 mg/200 mL premix  Status:  Discontinued       "Followed by" Linked Group Details   1,000 mg 200 mL/hr over 60 Minutes Intravenous Every 12 hours 01/05/20 1904 01/06/20 2202   01/05/20 2000  vancomycin (VANCOREADY) IVPB 1500 mg/300 mL       "Followed by" Linked Group Details   1,500 mg 150 mL/hr over 120 Minutes Intravenous  Once 01/05/20 1904 01/06/20 0127   01/05/20 1830  cefTRIAXone (ROCEPHIN) 2 g in sodium chloride 0.9 % 100 mL IVPB        2 g 200 mL/hr over 30 Minutes Intravenous  Once 01/05/20 1829 01/05/20 2220       Time spent: 20 minutes    [name] ANP  [facility] 7 am - 330 pm/M-F for direct patient care and secure chat Please see Amion for contact information 174 days

## 2020-06-28 NOTE — Progress Notes (Signed)
Due to patient being psychiatrically cleared - CSW will not renew IVC after it expires today.  Edwin Dada, MSW, LCSW Transitions of Care  Clinical Social Worker II [phone]

## 2020-06-29 ENCOUNTER — Other Ambulatory Visit: Payer: Self-pay | Admitting: Nurse Practitioner

## 2020-06-29 LAB — COMPREHENSIVE METABOLIC PANEL
ALT: 193 U/L — ABNORMAL HIGH (ref 0–44)
AST: 296 U/L — ABNORMAL HIGH (ref 15–41)
Albumin: 3.4 g/dL — ABNORMAL LOW (ref 3.5–5.0)
Alkaline Phosphatase: 101 U/L (ref 38–126)
Anion gap: 9 (ref 5–15)
BUN: 14 mg/dL (ref 6–20)
CO2: 26 mmol/L (ref 22–32)
Calcium: 9.2 mg/dL (ref 8.9–10.3)
Chloride: 103 mmol/L (ref 98–111)
Creatinine, Ser: 0.71 mg/dL (ref 0.44–1.00)
GFR, Estimated: >60 mL/min (ref >60)
Glucose, Bld: 101 mg/dL — ABNORMAL HIGH (ref 70–99)
Potassium: 4 mmol/L (ref 3.5–5.1)
Sodium: 138 mmol/L (ref 135–145)
Total Bilirubin: 1.5 mg/dL — ABNORMAL HIGH (ref 0.3–1.2)
Total Protein: 6 g/dL — ABNORMAL LOW (ref 6.5–8.1)

## 2020-06-29 MED ORDER — METHOCARBAMOL 500 MG PO TABS: 500.0000 mg | ORAL_TABLET | Freq: Four times a day (QID) | ORAL | 0 refills | Status: DC | PRN

## 2020-06-29 MED ORDER — OXCARBAZEPINE 150 MG PO TABS: 150.0000 mg | ORAL_TABLET | Freq: Two times a day (BID) | ORAL | 0 refills | Status: DC

## 2020-06-29 MED ORDER — OXCARBAZEPINE 150 MG PO TABS
150.0000 mg | ORAL_TABLET | Freq: Two times a day (BID) | ORAL | Status: DC
  Administered 2020-06-29 – 2020-07-02 (×7): 150 mg via ORAL
  Filled 2020-06-29 (×7): qty 1

## 2020-06-29 MED ORDER — PYRIDOXINE HCL 100 MG PO TABS: 100.0000 mg | ORAL_TABLET | Freq: Every day | ORAL | 0 refills | Status: DC

## 2020-06-29 MED ORDER — NALTREXONE HCL 50 MG PO TABS: 25.0000 mg | ORAL_TABLET | Freq: Every day | ORAL | 0 refills | Status: DC

## 2020-06-29 MED ORDER — PANTOPRAZOLE SODIUM 40 MG PO TBEC: 40.0000 mg | DELAYED_RELEASE_TABLET | Freq: Every day | ORAL | 0 refills | Status: DC

## 2020-06-29 MED ORDER — ZIPRASIDONE HCL 80 MG PO CAPS: 80.0000 mg | ORAL_CAPSULE | Freq: Two times a day (BID) | ORAL | 0 refills | Status: DC

## 2020-06-29 MED ORDER — CARBAMAZEPINE 200 MG PO TABS: 200.0000 mg | ORAL_TABLET | Freq: Two times a day (BID) | ORAL | 0 refills | Status: DC

## 2020-06-29 MED ORDER — MIRTAZAPINE 30 MG PO TABS: 30.0000 mg | ORAL_TABLET | Freq: Every day | ORAL | 0 refills | Status: DC

## 2020-06-29 MED ORDER — THIAMINE HCL 100 MG PO TABS: 100.0000 mg | ORAL_TABLET | Freq: Every day | ORAL | 0 refills | Status: DC

## 2020-06-29 MED ORDER — TRAZODONE HCL 150 MG PO TABS: 150.0000 mg | ORAL_TABLET | Freq: Every day | ORAL | 0 refills | Status: DC

## 2020-06-29 MED ORDER — CITALOPRAM HYDROBROMIDE 40 MG PO TABS: 40.0000 mg | ORAL_TABLET | Freq: Every day | ORAL | 0 refills | Status: DC

## 2020-06-29 MED ORDER — POLYETHYLENE GLYCOL 3350 17 G PO PACK: 17.0000 g | PACK | Freq: Two times a day (BID) | ORAL | 0 refills | Status: DC

## 2020-06-29 MED ORDER — CARBAMAZEPINE 200 MG PO TABS
200.0000 mg | ORAL_TABLET | Freq: Two times a day (BID) | ORAL | Status: DC
  Filled 2020-06-29: qty 1

## 2020-06-29 MED FILL — OXcarbazepine 150 MG TABS: 150 | 30 days supply | Qty: 60 | Fill #0

## 2020-06-29 MED FILL — VITAMIN B6 50 MG TABS: 50 | 30 days supply | Qty: 60 | Fill #0

## 2020-06-29 MED FILL — traZODone HCL 150 MG TABS: 150 | 30 days supply | Qty: 30 | Fill #0

## 2020-06-29 MED FILL — VITAMIN B-1 100 MG TABS: 100 | 30 days supply | Qty: 30 | Fill #0

## 2020-06-29 MED FILL — ZIPRASIDONE HCL 80 MG CAPS: 80 | 30 days supply | Qty: 60 | Fill #0

## 2020-06-29 MED FILL — MIRTAZAPINE 30 MG TABLET: 30 | 30 days supply | Qty: 30 | Fill #0

## 2020-06-29 MED FILL — POLYETHYLENE GLYCOL 3350 PO: 17 | 30 days supply | Qty: 1020 | Fill #0

## 2020-06-29 MED FILL — NALTREXONE 50 MG TABLET: 50 | 60 days supply | Qty: 30 | Fill #0

## 2020-06-29 NOTE — Progress Notes (Addendum)
1:20pm: CSW received return call from Ginger Blue at [facility] - agency unable to meet patient's psychiatric needs.  CSW spoke with at receptionist at [facility] who states there is a waiting list of 6 weeks for new patients.  CSW spoke with receptionist at [facility] who states the agency is not accepting new patient's at this time due to limited staffing.  CSW spoke with Synetta Fail at [facility] - Synetta Fail reports availability for appointments as early as 07/14/20. CSW unable to make appointment for patient, CSW contacted patient's step mom [name] to provide her with information to schedule appointment.  11:20am: CSW attempted to reach [facility] several times for outpatient follow up without success - a voicemail was left requesting a return call.  CSW attempted to obtain outpatient follow up from [facility] - however, there is no availability for several months.  CSW spoke with receptionist at [facility] in [facility] - electronic referral made, receptionist to return call to schedule appointment.  Edwin Dada, MSW, LCSW Transitions of Care  Clinical Social Worker II [phone]

## 2020-06-29 NOTE — TOC Progression Note (Signed)
Transition of Care [facility]) - Progression Note    Patient Details  Name: Anita Davis MRN: [id number] Date of Birth: [birth year]  Transition of Care [facility]) CM/SW [place], RN Phone Number: 06/29/2020, 10:41 AM  Clinical Narrative:    Case management met with the patient at the bedside in regards to transitions of care to home with the family.  The patient will be discharging home with her stepmother and father - most probably this week.  The patient will need a primary care provider in [place], [place] called [facility] and set up a primary care appointment scheduled for July 15, 2020 at 10 am for hospital follow up and lab-work for liver function tests per Erin Hearing, NP.  CM and MSW to follow the patient for discharge planning.   Expected Discharge Plan: [place] Barriers to Discharge: Continued Medical Work up,Inadequate or no insurance,Active Substance Use - Placement,SNF Pending payor source - LOG,SNF Pending Medicaid,SNF Pending bed offer  Expected Discharge Plan and Services Expected Discharge Plan: [place] In-house Referral: Development worker, community   Post Acute Care Choice: [place] Living arrangements for the past 2 months: Mobile Home                                       Social Determinants of Health (SDOH) Interventions    Readmission Risk Interventions No flowsheet data found.

## 2020-06-29 NOTE — Progress Notes (Addendum)
[facility] PROGRESS NOTE  Anita Davis OZH:[id number] DOB: Jun 09, 1985 DOA: 01/04/2020 PCP: Care, [facility]    02/10/20                      02/15/20                       04/08/20    04/01/21  Status: Remains inpatient appropriate because:Altered mental status, Unsafe d/c plan and Inpatient level of care appropriate due to severity of illness   Dispo: The patient is from: Home              Anticipated d/c is to: Home              Anticipated d/c date is: > 3 days              Patient currently is medically stable to d/c.  Barriers to discharge: Currently experiencing difficulty locating psychiatrist for follow-up but currently plan is to discharge on 3/11. LCSW has spken with Synetta Fail at [facility]. They have appointments as early as next week but [name]'s family will need to make an initial appointment.  Text and VM left by LCSW for [facility] patient's stepmother.    Code Status: DNR Family Communication: Stepmother [name] 3/9 updated on PCP and neurocognitive ambulatory referrals and appointment times.  Also updated on changes in medications in regards to discontinuation of Tegretol secondary to elevated liver enzymes. 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  Patient presented to [facility] ER in [place] several times prior to admission.  Initial presentation was related to symptoms from acute cystitis.  Two other presentations related to insomnia.  On those occasions she was able to be discharged from the ER.  She returned again on 13 September being brought in by family for altered mental status.  After admission this patient developed seizure  activity which progressed to status epilepticus therefore she was transferred to [facility] for further neurological evaluation.  After several days recurrent seizures were aborted with combination of 3 antiepileptic drugs.  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  Fortunately this did not improve patient's mentation and she continued to have waxing and waning episodes of alertness and encephalopathy.  Since admission she has not had any further seizures so these medications were discontinued.  She has eventually awakened behaviors alternating between awake and appropriate, awake crying and depressed occasionally delusional, severe agitation and aggression with significant delusions.  She has been evaluated both by neurology and psychiatry who suspect a combination of sequelae from underlying brain injury as well as bipolar disorder with schizoaffective features.  During one of her alert phases patient did admit to being diagnosed with bipolar disease prior to admission.  Patient is on multiple psychotropic medications continued to have episodes of violent outbursts.  She had required frequent doses of IM Geodon as well as Recruitment consultant and frequent intervention by security staff during her episodes of severe agitation.  Recent chart review by [facility] states patient would not be a candidate for ECT given recent brain injury sequela as well as presentation with status epilepticus.  Has  been stable regarding extreme agitation and/or violent behaviors since 1/24 when she had received her last Geodon IM dose.  On the evening of 2/4 she was also given a dose of IM Geodon.  There was no chart documentation stating what type of behavior she was experiencing but typically this medication is only given if she has extreme agitation.  Patient has been followed regularly during the hospitalization by our inpatient  psychiatric team.  As of 3/4 the team determined that there would be no benefit in transferring patient to [facility] given her extremes in behavior have resolved.  Decision made at that time to allow IVC to expire on 3/8 and to remove sitter from bedside.  Plan at this time is to discharge patient to home environment.  Patient now has active Medicaid in [facility] and this would allow her to obtain appropriate medications at discharge.  Psychiatric team plans to speak with patient's father and stepmother who are also her guardians to aid in establishing a safety and other discharge plan regarding her psychiatric illness.  Because of her underlying brain injury from status epilepticus she will have an ambulatory referral placed to [facility] so she can undergo neurocognitive evaluation.  Because there is a lack of psychiatric services in the [place] area/[place] area we trying to locate an appropriate psychiatric provider i.e. psychiatrist or psychiatric NP/PA to provide outpatient care for med management.  Subjective: Alert and sitting up in bed.  Had just completed a.m. care noting she had cut her own hair.  She has paid careful attention to her physical appearance today noting she has styled her hair and spiked it in the back and has make-up on.  She continues to have flat affect and states "I just want to go home".  Reassured her that discharge plan is to discharge home by Friday which is also her birthday once we have follow-up psychiatric physician appointment in place.  We are also waiting on psych team to have a conference with the patient's dad and stepmom before discharge.  Objective: Vitals:   06/28/20 2158 06/29/20 0528  BP: (!) 92/54 97/68  Pulse: 61 (!) 57  Resp: 17 17  Temp: (!) 97.5 F (36.4 C) 98 F (36.7 C)  SpO2: 98% 94%    Intake/Output Summary (Last 24 hours) at 06/29/2020 1132 Last data filed at 06/29/2020 0900 Gross per 24 hour  Intake 540 ml   Output --  Net 540 ml   Filed Weights   06/25/20 0516 06/26/20 0612 06/27/20 0515  Weight: 83 kg 83.2 kg 83.5 kg    Exam: General: Alert, calm, no acute distress Pulmonary:  Clear, stable on room air Cardiac: Normal heart sounds, no peripheral edema, pulse regular Abdomen: Soft nontender with normoactive bowel sounds and excellent appetite.  LBM 3/7 Neurological:  Cranial nerves 2-12 intact, no focal neurological deficits and ambulates without difficulty Psychiatric: Awake, alert with flat affect.  Oriented x3 today and no delusional thoughts. 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 During periods of alertness and lucidity patient denied use of  methamphetamines prior to admission although did admit to abruptly stopping her methadone -Remains intermittently oriented with episodes of delusion and lack of insight into her current psychiatric status -3/4 kg pending in regards to following QTC -Continue max dose Geodon 80 mg BID, naltrexone, scheduled Klonopin, Celexa and Remeron at hour of sleep -DUE to worsening transaminitis stable total bilirubin discussed with psych team and Tegretol has been discontinued as of 3/9 and instead Trileptal 150 mg twice daily has been started and will be continued upon discharge. -On 3/4 was reevaluated by psychiatric team who now recommend discharge to home and allow current IVC to expire and discontinuation of safety sitter. -Ambulatory  referral to [facility] sent in regards to patient requirement for neurocognitive evaluation in the outpatient setting -A referral form has also been sent to [facility] in [facility] to assist patient in arranging outpatient psychiatrist/psychiatric provider (NON LCSW) schedule an outpatient appointment.  Worsening transaminitis in context of hepatitis C -Elevated HCV antibody with markedly elevated HCV RNA quantitative level  consistent with chronic hepatitis C  -11/10: Discussed with ID Dr. Manson Passey. Treatment of hepatitis C typically is initiated in the outpatient setting. Medications can be quite expensive and usually take time to obtain insurance approval.  -ID recommends scheduling outpatient appointment their clinic closer to discharge date -Abdominal ultrasound unremarkable to -AST and ALT continue to rise with stable total bilirubin 1.5.  Discussed with psych team and decision made to discontinue Tegretol  Severe insomnia -Continue Remeron and trazodone   Recurrent back pain -Initiate ibuprofen and Robaxin oral as needed  History of polysubstance abuse with heroin/history of outpatient methadone treatment -Prescribed methadone 60 mg daily from a clinic in [place] prior to admission -Continue naltrexone to treat heroin addiction as well as recent issues with impulsivity and hypersexual behaviors during admission which are suspected related to brain injury and worsening psychiatric condition   Depression and severe anxiety disorder/PTSD/no bipolar disorder now with schizoaffective features -Extremes in behavior have significantly improved-continue IVC (last updated 3/1) -See above regarding pharmacotherapy  Recurrent constipation/nausea -Continue Colace 100 mg twice daily prn -Continue MiraLAX to twice daily - has had multiple bowel movements since initiation of scheduled lactulose-lactulose could be contributing to ongoing nausea therefore will change  to prn -As a precaution with her history of recurrent UTI will check urinalysis and culture to ensure nausea not secondary to UTI  Chest pain secondary to reflux -Cont Protonix daily -Ck EKG (need to ck Qtc 2/2 Geodon)  Pelvic pain -Reported to staff as 10/10 -ck UA -? Menstrual etiology-patient cannot remember when her last menstrual cycle occurred -Pelvic ultrasound unremarkable and symptoms seem to improve after voiding    Other problems: Status epilepticus -Resolved -Suspect precipitated by abrupt withdrawal of methadone as reported by patient -Repeat MRI on 12/28 within normal limits and it is felt behavioral issues related to brain injury sequelae and chronic psychiatric condition.  Neurology has signed off -Vimpat, Dilantin and phenobarbital have been tapered and discontinued at recommendation of neurologist  Back pain 2/2 abnormal urinalysis consistent with UTI -Patient with low-grade fever overnight and is now having back pain for days not responsive to NSAIDs and muscle relaxers -DG lumbar spine unremarkable -During hospitalization patient has been treated for pansensitive E. coli UTI as well as Salmonella UTI both of which have been sensitive to Levaquin -Completed Levaquin 12/20-urine culture obtained after antibiotics initiated and showed no growth -CT abdomen and pelvis on 12/17 unremarkable  Physical deconditioning -Resolved -Does  continue to exhibit impulsivity but maintains appropriate balance and gait no longer requires one-to-one safety sitter  Large hemorrhoids. -Resolved -Markedly improved-LD Proctofoam 12/15  Dysphagia -Resolved -Continue regular diet  Back pain 2/2 abnormal urinalysis consistent with UTI/history of Salmonella and E. coli UTI during hospitalization -Patient with low-grade fever overnight and is now having back pain for days not responsive to NSAIDs and muscle relaxers -DG lumbar spine unremarkable -During hospitalization patient  has been treated for pansensitive E. coli UTI as well as Salmonella UTI both of which have been sensitive to Levaquin -Completed Levaquin 12/20-urine culture obtained after antibiotics initiated and showed no growth -CT abdomen and pelvis on 12/17 unremarkable  Data Reviewed: Basic Metabolic Panel: Recent Labs  Lab 06/23/20 1458 06/25/20 0117 06/26/20 0051 06/28/20 0032 06/29/20 0129  NA 139 137 138 137 138  K 4.4 3.8 4.0 3.8 4.0  CL 106 105 104 103 103  CO2 GLUCOSE 127* 156* 123* 96 101*  BUN CREATININE 0.79 0.65 0.65 0.68 0.71  CALCIUM 8.8* 8.4* 9.0 8.8* 9.2   Liver Function Tests: Recent Labs  Lab 06/23/20 1458 06/25/20 0117 06/26/20 0051 06/28/20 0032 06/29/20 0129  AST 194* 166* 164* 206* 296*  ALT 164* 143* 147* 154* 193*  ALKPHOS 107 126 112 99 101  BILITOT 1.2 1.5* 1.5* 1.8* 1.5*  PROT 6.4* 5.7* 5.8* 5.8* 6.0*  ALBUMIN 3.4* 3.1* 3.3* 3.3* 3.4*   No results for input(s): LIPASE, AMYLASE in the last 168 hours. No results for input(s): AMMONIA in the last 168 hours. CBC: Recent Labs  Lab 06/23/20 1458 06/25/20 0117  WBC 3.1* 4.0  HGB 13.0 12.7  HCT 39.0 38.4  MCV 94.9 95.8  PLT 262 254   Cardiac Enzymes: No results for input(s): CKTOTAL, CKMB, CKMBINDEX, TROPONINI in the last 168 hours. BNP (last 3 results) No results for input(s): BNP in the last 8760 hours.  ProBNP (last 3 results) No results for input(s): PROBNP in the last 8760 hours.  CBG: No results for input(s): GLUCAP in the last 168 hours.  No results found for this or any previous visit (from the past 240 hour(s)).   Studies: No results found.  Scheduled Meds: . citalopram  40 mg Oral Daily  . clonazePAM  0.5 mg Oral Q6H  . docusate sodium  100 mg Oral BID  . feeding supplement  237 mL Oral BID BM  . melatonin  10 mg Oral QHS  . mirtazapine  30 mg Oral QHS  . multivitamin with minerals  1 tablet Oral Daily  . naltrexone  25 mg Oral Daily  .  OXcarbazepine  150 mg Oral BID  . pantoprazole  40 mg Oral Daily  . polyethylene glycol  17 g Oral BID  . vitamin B-6  100 mg Oral Daily  . thiamine  100 mg Oral Daily  . traZODone  150 mg Oral QHS  . ziprasidone  80 mg Oral 2 times per day   Continuous Infusions:   Principal Problem:   Organic brain syndrome Active Problems:   Polysubstance abuse (HCC)   Transaminitis   Refractory seizure (HCC)   Chronic hepatitis C without hepatic coma (HCC)   Distended abdomen   Palliative care encounter   Colonic Ileus (HCC)   Inadequate oral nutritional intake   Impulse disorder   Severe bipolar I disorder, current or most recent episode depressed (HCC)   Brain injury [facility])   Consultants:  Neurology  Psychiatry  Interventional radiology  Surgery    Procedures:  9/14 lumbar puncture  9/15 EEG  9/16 EEG  9/17 EEG  9/19 overnight EEG with video  9/22 overnight EEG with video with discontinuation of long-term EEG monitoring on 9/25  9/24 core track placement  10/6 EEG  12/15 PEG tube removed    Antibiotics: Anti-infectives (From admission, onward)   Start     Dose/Rate Route Frequency Ordered Stop   05/11/20 0845  amoxicillin-clavulanate (AUGMENTIN) 500-125 MG per tablet 500 mg        1 tablet Oral Every 8 hours 05/11/20 0750 05/18/20 0559   05/11/20 0845  metroNIDAZOLE (FLAGYL) tablet 500 mg        500 mg Oral Every 8 hours 05/11/20 0750 05/18/20 0559   04/05/20 1415  levofloxacin (LEVAQUIN) tablet 500 mg        500 mg Oral Daily 04/05/20 1327 04/11/20 0911   03/19/20 1000  metroNIDAZOLE (FLAGYL) tablet 500 mg  Status:  Discontinued        500 mg Per Tube 2 times daily 03/19/20 0822 03/23/20 0827   03/18/20 1200  amoxicillin (AMOXIL) 250 MG/5ML suspension 500 mg  Status:  Discontinued        500 mg Per Tube Every 8 hours 03/18/20 0915 03/23/20 0559   03/17/20 1200  cefTRIAXone (ROCEPHIN) 1 g in sodium chloride 0.9 % 100 mL IVPB  Status:  Discontinued         1 g 200 mL/hr over 30 Minutes Intravenous Every 24 hours 03/17/20 1048 03/18/20 0913   03/16/20 1130  metroNIDAZOLE (FLAGYL) 50 mg/ml oral suspension 500 mg  Status:  Discontinued        500 mg Per Tube 2 times daily 03/16/20 1040 03/19/20 0821   03/16/20 1130  fluconazole (DIFLUCAN) 40 MG/ML suspension 152 mg        150 mg Per Tube  Once 03/16/20 1040 03/16/20 1245   02/11/20 1526  ceFAZolin (ANCEF) IVPB 2g/100 mL premix        2 g 200 mL/hr over 30 Minutes Intravenous  Once 02/11/20 1526 02/11/20 1641   02/05/20 0600  ceFAZolin (ANCEF) IVPB 2g/100 mL premix        2 g 200 mL/hr over 30 Minutes Intravenous To Short Stay 02/04/20 1054 02/05/20 0950   01/29/20 0600  ceFAZolin (ANCEF) IVPB 2g/100 mL premix        2 g 200 mL/hr over 30 Minutes Intravenous On call to O.R. 01/28/20 1511 01/30/20 0559   01/28/20 2000  cefTRIAXone (ROCEPHIN) 1 g in sodium chloride 0.9 % 100 mL IVPB        1 g 200 mL/hr over 30 Minutes Intravenous Every 24 hours 01/28/20 1925 02/02/20 0724   01/06/20 0900  cefTRIAXone (ROCEPHIN) 2 g in sodium chloride 0.9 % 100 mL IVPB  Status:  Discontinued        2 g 200 mL/hr over 30 Minutes Intravenous Every 12 hours 01/06/20 0105 01/06/20 2202   01/06/20 0800  vancomycin (VANCOCIN) IVPB 1000 mg/200 mL premix  Status:  Discontinued       "Followed by" Linked Group Details   1,000 mg 200 mL/hr over 60 Minutes Intravenous Every 12 hours 01/05/20 1904 01/06/20 2202   01/05/20 2000  vancomycin (VANCOREADY) IVPB 1500 mg/300 mL       "Followed by" Linked Group Details   1,500 mg 150 mL/hr over 120 Minutes Intravenous  Once 01/05/20 1904 01/06/20 0127   01/05/20 1830  cefTRIAXone (ROCEPHIN) 2 g in sodium chloride 0.9 % 100 mL IVPB        2 g 200 mL/hr over 30 Minutes Intravenous  Once 01/05/20 1829 01/05/20 2220       Time spent: 20 minutes    [name] ANP  [facility] 7 am - 330 pm/M-F for direct patient care and secure chat Please see Amion for contact  information 175 days

## 2020-06-29 NOTE — Progress Notes (Signed)
Pt calm and cooperative. Gets excited talking about her birthday and getting to see friends/family for her birthday this Friday.

## 2020-06-30 DIAGNOSIS — Z66 Do not resuscitate: Secondary | ICD-10-CM | POA: Diagnosis not present

## 2020-06-30 DIAGNOSIS — T43691A Poisoning by other psychostimulants, accidental (unintentional), initial encounter: Secondary | ICD-10-CM | POA: Diagnosis not present

## 2020-06-30 DIAGNOSIS — Z20822 Contact with and (suspected) exposure to covid-19: Secondary | ICD-10-CM | POA: Diagnosis not present

## 2020-06-30 DIAGNOSIS — G9341 Metabolic encephalopathy: Secondary | ICD-10-CM | POA: Diagnosis not present

## 2020-06-30 LAB — COMPREHENSIVE METABOLIC PANEL
ALT: 226 U/L — ABNORMAL HIGH (ref 0–44)
AST: 348 U/L — ABNORMAL HIGH (ref 15–41)
Albumin: 3.4 g/dL — ABNORMAL LOW (ref 3.5–5.0)
Alkaline Phosphatase: 117 U/L (ref 38–126)
Anion gap: 8 (ref 5–15)
BUN: 13 mg/dL (ref 6–20)
CO2: 27 mmol/L (ref 22–32)
Calcium: 8.9 mg/dL (ref 8.9–10.3)
Chloride: 103 mmol/L (ref 98–111)
Creatinine, Ser: 0.71 mg/dL (ref 0.44–1.00)
GFR, Estimated: >60 mL/min (ref >60)
Glucose, Bld: 110 mg/dL — ABNORMAL HIGH (ref 70–99)
Potassium: 3.9 mmol/L (ref 3.5–5.1)
Sodium: 138 mmol/L (ref 135–145)
Total Bilirubin: 1.4 mg/dL — ABNORMAL HIGH (ref 0.3–1.2)
Total Protein: 6 g/dL — ABNORMAL LOW (ref 6.5–8.1)

## 2020-06-30 LAB — CARBAMAZEPINE, FREE AND TOTAL: Carbamazepine, Total: 5.7 ug/mL (ref 4.0–12.0)

## 2020-06-30 LAB — CARBAMAZEPINE LEVEL, TOTAL: Carbamazepine Lvl: 6.5 ug/mL (ref 4.0–12.0)

## 2020-06-30 NOTE — Progress Notes (Signed)
[facility] PROGRESS NOTE  Anita Davis OIZ:[id number] DOB: 1986-03-17 DOA: 01/04/2020 PCP: [facility], [facility]    02/10/20                      02/15/20                       04/08/20    04/01/21  Status: Remains inpatient appropriate because:Altered mental status, Unsafe d/c plan and Inpatient level of care appropriate due to severity of illness   Dispo: The patient is from: Home              Anticipated d/c is to: Home              Anticipated d/c date is: > 3 days              Patient currently is medically stable to d/c.  Barriers to discharge: Currently experiencing difficulty locating psychiatrist for follow-up but currently plan is to discharge on 3/11. LCSW has spken with Rodena Piety at [place]. They have appointments as early as next week but Anita Davis's family will need to make an initial appointment.  Text and VM left by LCSW for [facility] patient's stepmother.    Code Status: DNR Family Communication: 3/10 stepmother Vaughan Basta updated and communicated with with multiple text.  He has significant concerns regarding patient returning home to do if certain situations occur such as if undesired friends present to the house, what to do if Anita Davis has a violent outburst.  She also expressed to the psychiatric extender that Anita Davis is incompetent and they are her guardians but did not understand what that actually meant in regards to their caring for her.  Discussed with Vaughan Basta that we would like to have a meeting with myself, a member of the psychiatric team, LCSW, [place], TOC supervisor, Vaughan Basta and eventually with Anita Davis to discuss any questions about discharge and explained to [facility] rationale for why anticipated discharge for 3/11 is being delayed.  Vaughan Basta updated on Anita Davis has an appointment this coming Monday with a psychiatrist for initial visit at [facility] in [facility], ambulatory referral has been sent to [facility] physical medicine and rehab both for neuro  psychologist cognitive evaluation as well as to one of the rehab physicians for additional follow-up regarding her brain injury.  Also encouraged Vaughan Basta to reach out to the lawyer that assisted with the guardianship process to ask questions regarding the family's rights and Anita Davis's rights and regarding this process I also gave her the contact information for [name] in [facility] to see if they can assist with answering questions and encouraged her to visit the website. 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  Patient presented to [facility] ER in [place] several times prior to admission.  Initial presentation was related to symptoms from acute cystitis.  Two other presentations related to insomnia.  On those occasions she was able to be discharged from the ER.  She returned again on 13 September being brought in by family for altered mental status.  After admission this patient developed seizure activity which progressed to status epilepticus therefore she was transferred to [facility] for further neurological evaluation.  After several days recurrent seizures  were aborted with combination of 3 antiepileptic drugs.  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  Fortunately this did not improve patient's mentation and she continued to have waxing and waning episodes of alertness and encephalopathy.  Since admission she has not had any further seizures so these medications were discontinued.  She has eventually awakened behaviors alternating between awake and appropriate, awake crying and depressed  occasionally delusional, severe agitation and aggression with significant delusions.  She has been evaluated both by neurology and psychiatry who suspect a combination of sequelae from underlying brain injury as well as bipolar disorder with schizoaffective features.  During one of her alert phases patient did admit to being diagnosed with bipolar disease prior to admission.  Patient is on multiple psychotropic medications continued to have episodes of violent outbursts.  She had required frequent doses of IM Geodon as well as Air cabin crew and frequent intervention by security staff during her episodes of severe agitation.  Recent chart review by [facility] states patient would not be a candidate for ECT given recent brain injury sequela as well as presentation with status epilepticus.  Has been stable regarding extreme agitation and/or violent behaviors since 1/24 when she had received her last Geodon IM dose.  On the evening of 2/4 she was also given a dose of IM Geodon.  There was no chart documentation stating what type of behavior she was experiencing but typically this medication is only given if she has extreme agitation.  Patient has been followed regularly during the hospitalization by our inpatient psychiatric team.  As of 3/4 the team determined that there would be no benefit in transferring patient to [facility] given her extremes in behavior have resolved.  Decision made at that time to allow IVC to expire on 3/8 and to remove sitter from bedside.  Plan at this time is to discharge patient to home environment.  Patient now has active Medicaid in [facility] and this would allow her to obtain appropriate medications at discharge.  Psychiatric team plans to speak with patient's father and stepmother who are also her guardians to aid in establishing a safety and other discharge plan regarding her psychiatric illness.  Because of her underlying brain injury from status epilepticus she will have an ambulatory  referral placed to [facility] so she can undergo neurocognitive evaluation.  Because there is a lack of psychiatric services in the [place] area we trying to locate an appropriate psychiatric provider i.e. psychiatrist or psychiatric NP/PA to provide outpatient care for med management.  Subjective: Patient awake and alert.  Continues with flat, sad affect but states she is eager to discharge home.  At the time of my encounter with patient we had not yet heard from her family that they were reluctant to take patient home.  We will not discussed this with Anita Davis until we have met as a group and can have a definitive plan to provide to her before we states she is not going home on 3/11.  Objective: Vitals:   06/29/20 2117 06/30/20 0613  BP: 98/64 102/70  Pulse: 63 66  Resp: 19 16  Temp: 98 F (36.7 C) 97.8 F (36.6 C)  SpO2: 99% 98%    Intake/Output Summary (Last 24 hours) at 06/30/2020 0828 Last data filed at 06/29/2020 2000 Gross per 24 hour  Intake 1500 ml  Output -  Net 1500 ml  Filed Weights   06/25/20 0516 06/26/20 0612 06/27/20 0515  Weight: 83 kg 83.2 kg 83.5 kg    Exam: General: Alert, flat, but no acute distress Pulmonary:  Lungs are clear on posterior exam, stable on room air Cardiac: Normal heart sounds, no tachycardia, regular pulse, extremities warm Abdomen: Soft nontender with normoactive bowel sounds.  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 During periods of alertness and lucidity patient denied use of methamphetamines prior to admission although did admit to abruptly stopping her methadone -Remains intermittently oriented with episodes of delusion and lack of insight into her current psychiatric status -3/10 will obtain EKG to follow QTC on high-dose Geodon -Continue max dose Geodon 80 mg BID, naltrexone, scheduled Klonopin, Celexa and Remeron at hour of sleep -DUE to worsening transaminitis stable total bilirubin discussed with psych team and Tegretol has been discontinued as of 3/9 and instead Trileptal 150 mg twice daily has been started and will be continued upon discharge. -On 3/4 was reevaluated by psychiatric team who now recommend discharge to home and allow current IVC to expire and discontinuation of safety sitter. -Ambulatory referral to [facility] physical medicine and rehab sent in regards to patient requirement for neurocognitive evaluation in the outpatient setting rehab physician follow-up in regards to her brain injury -Patient has been given an appointment with [facility] psychiatrist for Monday 3/14 if she is able to discharge over the weekend.  If family continues to have concerns over discharge will cancel this appointment and reschedule once definitive discharge date determined  Worsening transaminitis in context of hepatitis C -Elevated HCV antibody with markedly elevated HCV RNA quantitative level  consistent with chronic hepatitis C  -11/10: Discussed with ID Dr. Yvette Rack. Treatment of hepatitis C typically is initiated in the outpatient setting. Medications can be quite expensive and usually take time to obtain  insurance approval.  -ID recommends scheduling outpatient appointment their clinic closer to discharge date -Abdominal ultrasound unremarkable 3/2 -AST and ALT continue to rise with stable total bilirubin 1.4 AS OF 3/10.  -Discussed with psych team and decision made to discontinue Tegretol on 3/9 -On 3/10 discussed with pharmacist and according to the literature Tegretol associated liver toxicity appears to take 2 - 3 weeks to start to see LFTs coming down and then complete resolution in ~ 3 months.  Severe insomnia -Continue Remeron and trazodone   Recurrent back pain -Initiate ibuprofen and Robaxin oral as needed  History of polysubstance abuse with heroin/history of outpatient methadone treatment -Prescribed methadone 60 mg daily from a clinic in [place] prior to admission -Continue naltrexone to treat heroin addiction as well as recent issues with impulsivity and hypersexual behaviors during admission which are suspected related to brain injury and worsening psychiatric condition   Depression and severe anxiety disorder/PTSD/no bipolar disorder now with schizoaffective features -Extremes in behavior have significantly improved-no longer under  IVC -See above regarding pharmacotherapy  Recurrent constipation/nausea -Continue Colace 100 mg twice daily prn -Continue MiraLAX to twice daily - has had multiple bowel movements since initiation of scheduled lactulose-lactulose could be contributing to ongoing nausea therefore will change to prn -As a precaution with her history of recurrent UTI will check urinalysis and culture to ensure nausea not secondary to UTI  Chest pain secondary to reflux -Cont Protonix daily -Ck EKG (need to ck Qtc 2/2 Geodon)  Pelvic pain -Reported to staff as 10/10 -ck UA -? Menstrual etiology-patient cannot remember when her last menstrual cycle occurred -Pelvic ultrasound unremarkable and symptoms seem to improve after voiding    Other  problems: Status epilepticus -Resolved -Suspect precipitated by abrupt withdrawal of methadone as reported by patient -Repeat MRI on 12/28 within normal limits and it is felt behavioral issues related to brain injury sequelae and chronic psychiatric condition.  Neurology has signed off -Vimpat, Dilantin and phenobarbital have been tapered and discontinued at recommendation of neurologist  Back pain 2/2 abnormal urinalysis consistent with UTI -Patient with low-grade fever overnight and is now having back pain for days not responsive to NSAIDs and muscle relaxers -DG lumbar spine unremarkable -During hospitalization patient has been treated for pansensitive E. coli UTI as well as Salmonella UTI both of which have been sensitive to Levaquin -Completed Levaquin 12/20-urine culture obtained after antibiotics initiated and showed no growth -CT abdomen and pelvis on 12/17 unremarkable  Physical deconditioning -Resolved -Does continue to exhibit impulsivity but maintains appropriate balance and gait no longer requires one-to-one safety sitter  Large hemorrhoids. -Resolved -Markedly improved-LD Proctofoam 12/15  Dysphagia -Resolved -Continue regular diet  Back pain 2/2 abnormal urinalysis consistent with UTI/history of Salmonella and E. coli UTI during hospitalization -Patient with low-grade fever overnight and is now having back pain for days not responsive to NSAIDs and muscle relaxers -DG lumbar spine unremarkable -During hospitalization patient has been treated for pansensitive E. coli UTI as well as Salmonella UTI both of which have been sensitive to Levaquin -Completed Levaquin 12/20-urine culture obtained after antibiotics initiated and showed no growth -CT abdomen and pelvis on 12/17 unremarkable  Data Reviewed: Basic Metabolic Panel: Recent Labs  Lab 06/25/20 0117 06/26/20 0051 06/28/20 0032 06/29/20 0129 06/30/20 0119  NA 137 138 137 138 138  K 3.8 4.0 3.8 4.0 3.9  CL  105 104 103 103 103  CO2 _0 GLUCOSE 156* 123* 96 101* 110*  BUN _1 CREATININE 0.65 0.65 0.68 0.71 0.71  CALCIUM 8.4* 9.0 8.8* 9.2 8.9   Liver Function Tests: Recent Labs  Lab 06/25/20 0117 06/26/20 0051 06/28/20 0032 06/29/20 0129 06/30/20 0119  AST 166* 164* 206* 296* 348*  ALT 143* 147* 154* 193* 226*  ALKPHOS 126 112 99 101 117  BILITOT 1.5* 1.5* 1.8* 1.5* 1.4*  PROT 5.7* 5.8* 5.8* 6.0* 6.0*  ALBUMIN 3.1* 3.3* 3.3* 3.4* 3.4*   No results for input(s): LIPASE, AMYLASE in the last 168 hours. No results for input(s): AMMONIA in the last 168 hours. CBC: Recent Labs  Lab 06/23/20 1458 06/25/20 0117  WBC 3.1* 4.0  HGB 13.0 12.7  HCT 39.0 38.4  MCV 94.9 95.8  PLT 262 254   Cardiac Enzymes: No results for input(s): CKTOTAL, CKMB, CKMBINDEX, TROPONINI in the last 168 hours. BNP (last 3 results) No results for input(s): BNP in the last 8760 hours.  ProBNP (last 3 results) No results for input(s): PROBNP in the last  8760 hours.  CBG: No results for input(s): GLUCAP in the last 168 hours.  No results found for this or any previous visit (from the past 240 hour(s)).   Studies: No results found.  Scheduled Meds: . citalopram  40 mg Oral Daily  . clonazePAM  0.5 mg Oral Q6H  . docusate sodium  100 mg Oral BID  . feeding supplement  237 mL Oral BID BM  . melatonin  10 mg Oral QHS  . mirtazapine  30 mg Oral QHS  . multivitamin with minerals  1 tablet Oral Daily  . naltrexone  25 mg Oral Daily  . OXcarbazepine  150 mg Oral BID  . pantoprazole  40 mg Oral Daily  . polyethylene glycol  17 g Oral BID  . vitamin B-6  100 mg Oral Daily  . thiamine  100 mg Oral Daily  . traZODone  150 mg Oral QHS  . ziprasidone  80 mg Oral 2 times per day   Continuous Infusions:   Principal Problem:   Organic brain syndrome Active Problems:   Polysubstance abuse (HCC)   Transaminitis   Refractory seizure (HCC)   Chronic hepatitis C without hepatic coma  (HCC)   Distended abdomen   Palliative care encounter   Colonic Ileus (HCC)   Inadequate oral nutritional intake   Impulse disorder   Severe bipolar I disorder, current or most recent episode depressed ([place])   Brain injury [facility])   Consultants:  Neurology  Psychiatry  Interventional radiology  Surgery    Procedures:  9/14 lumbar puncture  9/15 EEG  9/16 EEG  9/17 EEG  9/19 overnight EEG with video  9/22 overnight EEG with video with discontinuation of long-term EEG monitoring on 9/25  9/24 core track placement  10/6 EEG  12/15 PEG tube removed    Antibiotics: Anti-infectives (From admission, onward)   Start     Dose/Rate Route Frequency Ordered Stop   05/11/20 0845  amoxicillin-clavulanate (AUGMENTIN) 500-125 MG per tablet 500 mg        1 tablet Oral Every 8 hours 05/11/20 0750 05/18/20 0559   05/11/20 0845  metroNIDAZOLE (FLAGYL) tablet 500 mg        500 mg Oral Every 8 hours 05/11/20 0750 05/18/20 0559   04/05/20 1415  levofloxacin (LEVAQUIN) tablet 500 mg        500 mg Oral Daily 04/05/20 1327 04/11/20 0911   03/19/20 1000  metroNIDAZOLE (FLAGYL) tablet 500 mg  Status:  Discontinued        500 mg Per Tube 2 times daily 03/19/20 0822 03/23/20 0827   03/18/20 1200  amoxicillin (AMOXIL) 250 MG/5ML suspension 500 mg  Status:  Discontinued        500 mg Per Tube Every 8 hours 03/18/20 0915 03/23/20 0559   03/17/20 1200  cefTRIAXone (ROCEPHIN) 1 g in sodium chloride 0.9 % 100 mL IVPB  Status:  Discontinued        1 g 200 mL/hr over 30 Minutes Intravenous Every 24 hours 03/17/20 1048 03/18/20 0913   03/16/20 1130  metroNIDAZOLE (FLAGYL) 50 mg/ml oral suspension 500 mg  Status:  Discontinued        500 mg Per Tube 2 times daily 03/16/20 1040 03/19/20 0821   03/16/20 1130  fluconazole (DIFLUCAN) 40 MG/ML suspension 152 mg        150 mg Per Tube  Once 03/16/20 1040 03/16/20 1245   02/11/20 1526  ceFAZolin (ANCEF) IVPB 2g/100 mL premix  2 g 200 mL/hr  over 30 Minutes Intravenous  Once 02/11/20 1526 02/11/20 1641   02/05/20 0600  ceFAZolin (ANCEF) IVPB 2g/100 mL premix        2 g 200 mL/hr over 30 Minutes Intravenous To Short Stay 02/04/20 1054 02/05/20 0950   01/29/20 0600  ceFAZolin (ANCEF) IVPB 2g/100 mL premix        2 g 200 mL/hr over 30 Minutes Intravenous On call to O.R. 01/28/20 1511 01/30/20 0559   01/28/20 2000  cefTRIAXone (ROCEPHIN) 1 g in sodium chloride 0.9 % 100 mL IVPB        1 g 200 mL/hr over 30 Minutes Intravenous Every 24 hours 01/28/20 1925 02/02/20 0724   01/06/20 0900  cefTRIAXone (ROCEPHIN) 2 g in sodium chloride 0.9 % 100 mL IVPB  Status:  Discontinued        2 g 200 mL/hr over 30 Minutes Intravenous Every 12 hours 01/06/20 0105 01/06/20 2202   01/06/20 0800  vancomycin (VANCOCIN) IVPB 1000 mg/200 mL premix  Status:  Discontinued       "Followed by" Linked Group Details   1,000 mg 200 mL/hr over 60 Minutes Intravenous Every 12 hours 01/05/20 1904 01/06/20 2202   01/05/20 2000  vancomycin (VANCOREADY) IVPB 1500 mg/300 mL       "Followed by" Linked Group Details   1,500 mg 150 mL/hr over 120 Minutes Intravenous  Once 01/05/20 1904 01/06/20 0127   01/05/20 1830  cefTRIAXone (ROCEPHIN) 2 g in sodium chloride 0.9 % 100 mL IVPB        2 g 200 mL/hr over 30 Minutes Intravenous  Once 01/05/20 1829 01/05/20 2220       Time spent: 20 minutes    Erin Hearing ANP  [facility] 7 am - 330 pm/M-F for direct patient care and secure chat Please see Amion for contact information 176 days

## 2020-06-30 NOTE — TOC Progression Note (Signed)
Transition of Care [facility]) - Progression Note    Patient Details  Name: [name] MRN: [id number] Date of Birth: 1985/11/18  [facility] CM/SW Contact  Janae Bridgeman, RN Phone Number: 06/30/2020, 10:31 AM  Clinical Narrative:    Case management called and spoke with Corrie Dandy, Diplomatic Services operational officer with [facility] and requested clinicals to include facesheet, progress notes, and psychiatry notes - faxed to [facility] at [phone].  [name] received the clinicals and appointment was scheduled for the patient on Monday, 07/04/2020 at 0830 - this was included in the discharge instructions with attention to [facility] location.  The patient will need to arrive at 0800 am for paperwork,  [name], NP is following up with the stepmother and father to update them with physician and psychiatry follow ups and discharge planning for safe discharge to home.  CM and MSW will continue to follow the patient for discharge planning.   Expected Discharge Plan: Skilled Nursing Facility Barriers to Discharge: Continued Medical Work up,Inadequate or no insurance,Active Substance Use - Placement,SNF Pending payor source - LOG,SNF Pending Medicaid,SNF Pending bed offer  Expected Discharge Plan and Services Expected Discharge Plan: Skilled Nursing Facility In-house Referral: Artist   Post Acute Care Choice: Skilled Nursing Facility Living arrangements for the past 2 months: Mobile Home                                       Social Determinants of Health (SDOH) Interventions    Readmission Risk Interventions No flowsheet data found.

## 2020-07-01 LAB — COMPREHENSIVE METABOLIC PANEL WITH GFR
ALT: 230 U/L — ABNORMAL HIGH (ref 0–44)
AST: 318 U/L — ABNORMAL HIGH (ref 15–41)
Albumin: 3.4 g/dL — ABNORMAL LOW (ref 3.5–5.0)
Alkaline Phosphatase: 117 U/L (ref 38–126)
Anion gap: 8 (ref 5–15)
BUN: 10 mg/dL (ref 6–20)
CO2: 25 mmol/L (ref 22–32)
Calcium: 9 mg/dL (ref 8.9–10.3)
Chloride: 104 mmol/L (ref 98–111)
Creatinine, Ser: 0.72 mg/dL (ref 0.44–1.00)
GFR, Estimated: >60 mL/min
Glucose, Bld: 93 mg/dL (ref 70–99)
Potassium: 4 mmol/L (ref 3.5–5.1)
Sodium: 137 mmol/L (ref 135–145)
Total Bilirubin: 1.6 mg/dL — ABNORMAL HIGH (ref 0.3–1.2)
Total Protein: 6.1 g/dL — ABNORMAL LOW (ref 6.5–8.1)

## 2020-07-01 LAB — PROTIME-INR
INR: 1 (ref 0.8–1.2)
Prothrombin Time: 12.6 s (ref 11.4–15.2)

## 2020-07-01 MED ORDER — PYRIDOXINE HCL 100 MG PO TABS: 100.0000 mg | ORAL_TABLET | Freq: Every day | ORAL | 3 refills | Status: AC

## 2020-07-01 MED ORDER — MIRTAZAPINE 30 MG PO TABS: 30.0000 mg | ORAL_TABLET | Freq: Every day | ORAL | 3 refills | Status: DC

## 2020-07-01 MED ORDER — MELATONIN 10 MG PO TABS: 10.0000 mg | ORAL_TABLET | Freq: Every day | ORAL | 0 refills | Status: AC

## 2020-07-01 MED ORDER — PANTOPRAZOLE SODIUM 40 MG PO TBEC: 40.0000 mg | DELAYED_RELEASE_TABLET | Freq: Every day | ORAL | 3 refills | Status: AC

## 2020-07-01 MED ORDER — ADULT MULTIVITAMIN W/MINERALS CH: 1.0000 | ORAL_TABLET | Freq: Every day | ORAL | Status: DC

## 2020-07-01 MED ORDER — CITALOPRAM HYDROBROMIDE 40 MG PO TABS: 40.0000 mg | ORAL_TABLET | Freq: Every day | ORAL | 3 refills | Status: AC

## 2020-07-01 MED ORDER — DOCUSATE SODIUM 100 MG PO CAPS: 100.0000 mg | ORAL_CAPSULE | Freq: Two times a day (BID) | ORAL | 0 refills | Status: AC

## 2020-07-01 MED ORDER — OXCARBAZEPINE 150 MG PO TABS: 150.0000 mg | ORAL_TABLET | Freq: Two times a day (BID) | ORAL | 3 refills | Status: AC

## 2020-07-01 MED ORDER — ZIPRASIDONE HCL 80 MG PO CAPS: 80.0000 mg | ORAL_CAPSULE | Freq: Two times a day (BID) | ORAL | 3 refills | Status: DC

## 2020-07-01 MED ORDER — NALTREXONE HCL 50 MG PO TABS: 25.0000 mg | ORAL_TABLET | Freq: Every day | ORAL | 3 refills | Status: AC

## 2020-07-01 MED ORDER — ZIPRASIDONE HCL 80 MG PO CAPS: 80.0000 mg | ORAL_CAPSULE | Freq: Two times a day (BID) | ORAL | 3 refills | Status: AC

## 2020-07-01 MED ORDER — THIAMINE HCL 100 MG PO TABS: 100.0000 mg | ORAL_TABLET | Freq: Every day | ORAL | 3 refills | Status: AC

## 2020-07-01 MED ORDER — METHOCARBAMOL 500 MG PO TABS: 500.0000 mg | ORAL_TABLET | Freq: Four times a day (QID) | ORAL | 1 refills | Status: AC | PRN

## 2020-07-01 MED ORDER — POLYETHYLENE GLYCOL 3350 17 G PO PACK: 17.0000 g | PACK | Freq: Two times a day (BID) | ORAL | 3 refills | Status: AC

## 2020-07-01 MED ORDER — TRAZODONE HCL 150 MG PO TABS: 150.0000 mg | ORAL_TABLET | Freq: Every day | ORAL | 3 refills | Status: AC

## 2020-07-01 NOTE — Consult Note (Signed)
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  Since her seizures were controlled the majority of her hospitalization focused on her psychiatric issues.  Her liver enzymes were noted to be abnormal secondary to her HCV, but she continued to have a marked increase in her liver enzymes.  The decision was made to discontinue her Tegretol.  Anita Rennis Harding, FNP discussed with pharmacy about the markedly abnormal liver enzymes.  The anticipation is that her liver enzymes will start to decline in 2-3 weeks with complete resolution in 3 months.    Past Medical History:  Diagnosis Date  . Allergy   . Bronchitis   . Hepatitis-C   . History of gestational diabetes   . HPV in female   . Substance abuse [facility])     Past Surgical History:  Procedure Laterality Date  . HEMORRHOID BANDING  age 5  . IR GASTROSTOMY TUBE REMOVAL  04/06/2020  . IR REPLC GASTRO/COLONIC TUBE PERCUT W/FLUORO  02/11/2020  . LAPAROSCOPIC GASTROSTOMY N/A 02/05/2020   Procedure: LAPAROSCOPIC GASTROSTOMY PLACEMENT;  Surgeon: Gaynelle Adu, MD;  Location: [facility] OR;  Service: General;  Laterality: N/A;    Family History  Problem Relation Age of Onset  . Depression Mother   . Cervical cancer Mother   . Hypertension Father   . Diabetes Father     Social History:  reports that she has been smoking cigarettes and cigars. She has smoked for the past 24.00 years. She has never used smokeless tobacco. She reports previous drug use. Drugs: IV, Cocaine, Hydrocodone, Oxycodone, Marijuana, and Heroin. She reports that she does not drink alcohol.  Allergies: No Known Allergies  Medications:  Scheduled: . citalopram  40 mg Oral Daily  . clonazePAM  0.5 mg Oral Q6H  . docusate sodium  100 mg Oral BID  . feeding  supplement  237 mL Oral BID BM  . melatonin  10 mg Oral QHS  . mirtazapine  30 mg Oral QHS  . multivitamin with minerals  1 tablet Oral Daily  . naltrexone  25 mg Oral Daily  . OXcarbazepine  150 mg Oral BID  . pantoprazole  40 mg Oral Daily  . polyethylene glycol  17 g Oral BID  . vitamin B-6  100 mg Oral Daily  . thiamine  100 mg Oral Daily  . traZODone  150 mg Oral QHS  . ziprasidone  80 mg Oral 2 times per day   Continuous:   Results for orders placed or performed during the hospital encounter of 01/04/20 (from the past 24 hour(s))  Comprehensive metabolic panel     Status: Abnormal   Collection Time: 07/01/20  1:48 AM  Result Value Ref Range   Sodium 137 135 - 145 mmol/L   Potassium 4.0 3.5 - 5.1 mmol/L   Chloride 104 98 - 111 mmol/L   CO2 25 22 - 32 mmol/L   Glucose, Bld 93 70 - 99 mg/dL   BUN 10 6 - 20 mg/dL   Creatinine, Ser 8.14 0.44 - 1.00 mg/dL   Calcium 9.0 8.9 - 48.1 mg/dL   Total Protein 6.1 (L) 6.5 - 8.1 g/dL   Albumin 3.4 (L) 3.5 - 5.0 g/dL   AST 856 (H) 15 - 41 U/L   ALT 230 (H) 0 - 44 U/L   Alkaline  Phosphatase 117 38 - 126 U/L   Total Bilirubin 1.6 (H) 0.3 - 1.2 mg/dL   GFR, Estimated >65 >78 mL/min   Anion gap 8 5 - 15     No results found.  ROS:  As stated above in the HPI otherwise negative.  Blood pressure 104/69, pulse 63, temperature 98.1 F (36.7 C), temperature source Oral, resp. rate 18, height 5\' 5"  (1.651 m), weight 83.5 kg, SpO2 99 %.    PE: Gen: NAD, Alert and Oriented, flat affect, slowly licking the icing from her cupcake. HEENT:  [place]/AT, EOMI Neck: Supple, no LAD Lungs: CTA Bilaterally CV: RRR without M/G/R ABD: Soft, NTND, +BS Ext: No C/C/E  Assessment/Plan: 1) Tegretol-induced abnormal liver enzymes. 2) HCV. 3) History of status epilepticus. 4) Psychiatric issues. 5) History of IVDA.   Tegretol is well-known to cause a hepatitis.  I am appreciative of Mrs. inquiry with Pharmacy about the time course for liver  enzyme abnormalities and its duration.  I concur with the recommendation to follow the liver enzymes.  The anticipation is that she will decline down to her lower level of liver enzyme abnormalities consistent with her HCV.  HCV treatment, per ID is to treat as an outpatient.  There does not appear to be any hepatic function derrangement, but an updated INR needs to be checked.  Her last value was normal on 06/16/2020 at 1.0.  Plan: 1) Follow liver enzymes. 2) Check INR. 3) Reconsult if she develops any signs or symptoms of hepatic failure.  HUNG,[name] D 07/01/2020, 12:22 PM

## 2020-07-01 NOTE — Progress Notes (Signed)
[facility] PROGRESS NOTE  Anita Davis EAV:[id number] DOB: [birth year] DOA: 01/04/2020 [facility]    02/10/20                      02/15/20                       04/08/20    04/01/21  Status: Remains inpatient appropriate because:Altered mental status, Unsafe d/c plan and Inpatient level of care appropriate due to severity of illness   Dispo: The patient is from: Home              Anticipated d/c is to: Home              Anticipated d/c date is: > 3 days              Patient currently is medically stable to d/c.  Barriers to discharge: Currently experiencing difficulty locating psychiatrist for follow-up but currently plan is to discharge on 3/11. LCSW has spken with Synetta Fail[facility]ita at [facility]. They have appointments as early as next week but [name]'s family will need to make an initial appointment.  Text and VM [facility].    Code Status: DNR Family Communication: 3/10 stepmother Bonita QuinLinda updated  DVT prophylaxis: Consistently ambulatory so no DVT prophylaxis indicated Vaccination status: Fully vaccinated against Covid with second vaccine given on 02/23/2020  Foley catheter: No  HPI: 35 year old female with history of IV drug abuse/heroin on methadone prior to admission, hepatitis C, anxiety with depression and gestational diabetes.  [facility] in [place] several times prior to admission.  Initial presentation was related to symptoms from acute cystitis.  Two other presentations related to insomnia.  On those occasions she was able to be discharged from the ER.  She returned again on 13 September being brought in by family for altered mental status.  After admission this patient developed seizure activity which progressed to status epilepticus therefore [facility] neurological [facility].  After several days recurrent seizures were aborted with  combination of 3 antiepileptic drugs.  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  Fortunately this did not improve patient's mentation and she continued to have waxing and waning episodes of alertness and encephalopathy.  Since admission she has not had any further seizures so these medications were discontinued.  She has eventually awakened behaviors alternating between awake and appropriate, awake crying and depressed occasionally delusional, severe agitation and aggression with significant delusions.  She has been evaluated both by neurology and psychiatry who suspect a combination of sequelae from underlying brain injury as well as bipolar disorder with schizoaffective features.  During one of her alert phases patient did admit to being diagnosed with bipolar disease prior to admission.  Patient is on multiple psychotropic medications continued to have episodes of violent outbursts.  She had required frequent doses of IM Geodon as well as Recruitment consultantsafety sitter and frequent intervention by security staff during her episodes of severe agitation.  [facility] states patient would not be a candidate for ECT given recent brain injury sequela as well as presentation with status epilepticus.  Has been stable regarding extreme agitation and/or violent behaviors since 1/24 when she had received her last Geodon IM dose.  On the evening of 2/4 she  was also given a dose of IM Geodon.  There was no chart documentation stating what type of behavior she was experiencing but typically this medication is only given if she has extreme agitation.  Patient has been followed regularly during the hospitalization by our inpatient psychiatric team.  As of 3/4 the team determined that there would be no benefit in transferring patient to [facility] given her extremes in behavior have resolved.  Decision made at that time  to allow IVC to expire on 3/8 and to remove sitter from bedside.  Plan at this time is to discharge patient to home environment.  Patient now has active Medicaid in [facility] and this would allow her to obtain appropriate medications at discharge.  As of 3/10 patient has a psychiatrist appointment scheduled for March 24 with [name], we have also sent ambulatory referrals to [facility] for neurocognitive evaluation with her psychology team as well as follow-up with her brain injury physician for brain injury rehab.  On the afternoon of 3/11 on extensive conversation patient's guardians i.e. her dad and stepmother.  Explanation regarding expected course of her illness, safety measures to take to provide adequate safety for both the patient and her family, best ways to manage and administer medications, discuss all follow-up in process including application completed for care coordinator who will assist the family and eventually transitioning the patient to a family care home (if eligible) sometime in the near future.  Please see the discharge instructions that have been completed in regards to details.  Subjective:   Objective: Vitals:   06/30/20 2048 07/01/20 0525  BP: 108/76 104/69  Pulse: 62 63  Resp: 18 18  Temp: 98 F (36.7 C) 98.1 F (36.7 C)  SpO2: 98% 99%   No intake or output data in the 24 hours ending 07/01/20 0829 Filed Weights   06/25/20 0516 06/26/20 0612 06/27/20 0515  Weight: 83 kg 83.2 kg 83.5 kg    Exam: General: Alert, calm with continued bland affect.  No acute distress Pulmonary:  Lungs are clear to auscultation and she is stable on room air Cardiac: Normal heart sounds, pulses regular and nontachycardic, extremities without edema Abdomen: Soft nontender with normoactive bowel sounds.  Eating well LBM 3/7 Neurological:  Cranial nerves II through XII are grossly intact, no focal neurological deficits, moves all extremities x4 and is ambulatory  without difficulty Psychiatric: Alert oriented x3.  Extensive discussion regarding expectations after discharge including a list of expectations.  This was done in the morning.  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 During periods of alertness and lucidity patient denied use of methamphetamines prior to admission although did admit to abruptly stopping her methadone -Remains intermittently oriented with episodes of delusion and lack of insight into her current psychiatric status -3/10 EKG revealed stable QTC of 430 ms -Continue max dose Geodon 80 mg BID, naltrexone, scheduled Klonopin, Celexa and Remeron at hour of sleep -DUE to worsening  transaminitis stable total bilirubin discussed with psych team and Tegretol has been discontinued as of 3/9 and instead Trileptal 150 mg twice daily has been started and will be continued upon discharge. -On 3/4 was reevaluated by psychiatric team who now recommend discharge to home and allow current IVC to expire and discontinuation of safety sitter. -Ambulatory referral to [facility] sent in regards to patient requirement for neurocognitive evaluation in the outpatient setting rehab physician follow-up in regards to her brain injury -Patient has been given an appointment with [facility]  psychiatrist for Monday 3/14 if she is able to discharge over the weekend.  If family continues to have concerns over discharge will cancel this appointment and reschedule once definitive discharge date determined  Worsening transaminitis in context of hepatitis C -Elevated HCV antibody with markedly elevated HCV RNA quantitative level  consistent with chronic hepatitis C  -11/10: Discussed with ID Dr. Manson Passey. Treatment of hepatitis C typically is initiated in the outpatient setting. Medications can be quite expensive and usually take time to obtain insurance approval.  -ID recommends scheduling outpatient appointment their clinic closer to discharge date -Abdominal ultrasound unremarkable 3/2 -AST and ALT continue to rise with stable total bilirubin 1.4 AS OF 3/10.  -Discussed with psych team and decision made to discontinue Tegretol on 3/9 -On 3/10 discussed with pharmacist and according to the literature Tegretol associated liver toxicity appears to take 2 - 3 weeks to start to see LFTs coming down and then complete resolution in ~ 3 months. -Patient has been evaluated by gastroenterologist Dr. Elnoria Howard who has cleared patient to discharge home.  Recommends continued outpatient follow-up of hepatic enzymes and agrees with recommendations as outlined above from pharmacy  Severe insomnia -Continue Remeron and trazodone   Recurrent back pain -Initiate ibuprofen and Robaxin oral as needed  History of polysubstance abuse with heroin/history of outpatient methadone treatment -Prescribed methadone 60 mg daily from a clinic in [place] prior to admission -Continue naltrexone to treat heroin addiction as well as recent issues with impulsivity and hypersexual behaviors during admission which are suspected related to brain injury and worsening psychiatric condition   Depression and severe anxiety disorder/PTSD/no bipolar disorder now with schizoaffective features -Extremes in behavior have  significantly improved-no longer under IVC -See above regarding pharmacotherapy  Recurrent constipation/nausea -Continue Colace 100 mg twice daily prn -Continue MiraLAX to twice daily  Chest pain secondary to reflux -Cont Protonix daily  Pelvic pain -Pelvic ultrasound unremarkable and symptoms seem to improve after voiding    Other problems: Status epilepticus -Resolved -Suspect precipitated by abrupt withdrawal of methadone as reported by patient -Repeat MRI on 12/28 within normal limits and it is felt behavioral issues related to brain injury sequelae and chronic psychiatric condition.  Neurology has signed off -Vimpat, Dilantin and phenobarbital have been tapered and discontinued at recommendation of neurologist  Back pain 2/2 abnormal urinalysis consistent with UTI -Patient with low-grade fever overnight and is now having back pain for days not responsive to NSAIDs and muscle relaxers -DG lumbar spine unremarkable -During hospitalization patient has been treated for pansensitive E. coli UTI as well as Salmonella UTI both of which have been sensitive to Levaquin -Completed Levaquin 12/20-urine culture obtained after antibiotics initiated and showed no growth -CT abdomen and pelvis on 12/17 unremarkable  Physical deconditioning -Resolved -Does continue to exhibit impulsivity but maintains appropriate balance and gait no longer requires one-to-one safety sitter  Large hemorrhoids. -Resolved -Markedly improved-LD Proctofoam 12/15  Dysphagia -Resolved -  Continue regular diet  Back pain 2/2 abnormal urinalysis consistent with UTI/history of Salmonella and E. coli UTI during hospitalization -Patient with low-grade fever overnight and is now having back pain for days not responsive to NSAIDs and muscle relaxers -DG lumbar spine unremarkable -During hospitalization patient has been treated for pansensitive E. coli UTI as well as Salmonella UTI both of which have been  sensitive to Levaquin -Completed Levaquin 12/20-urine culture obtained after antibiotics initiated and showed no growth -CT abdomen and pelvis on 12/17 unremarkable  Data Reviewed: Basic Metabolic Panel: Recent Labs  Lab 06/26/20 0051 06/28/20 0032 06/29/20 0129 06/30/20 0119 07/01/20 0148  NA 138 137 138 138 137  K 4.0 3.8 4.0 3.9 4.0  CL 104 103 103 103 104  CO2 27 24 26 27 25   GLUCOSE 123* 96 101* 110* 93  BUN 11 15 14 13 10   CREATININE 0.65 0.68 0.71 0.71 0.72  CALCIUM 9.0 8.8* 9.2 8.9 9.0   Liver Function Tests: Recent Labs  Lab 06/26/20 0051 06/28/20 0032 06/29/20 0129 06/30/20 0119 07/01/20 0148  AST 164* 206* 296* 348* 318*  ALT 147* 154* 193* 226* 230*  ALKPHOS 112 99 101 117 117  BILITOT 1.5* 1.8* 1.5* 1.4* 1.6*  PROT 5.8* 5.8* 6.0* 6.0* 6.1*  ALBUMIN 3.3* 3.3* 3.4* 3.4* 3.4*   No results for input(s): LIPASE, AMYLASE in the last 168 hours. No results for input(s): AMMONIA in the last 168 hours. CBC: Recent Labs  Lab 06/25/20 0117  WBC 4.0  HGB 12.7  HCT 38.4  MCV 95.8  PLT 254   Cardiac Enzymes: No results for input(s): CKTOTAL, CKMB, CKMBINDEX, TROPONINI in the last 168 hours. BNP (last 3 results) No results for input(s): BNP in the last 8760 hours.  ProBNP (last 3 results) No results for input(s): PROBNP in the last 8760 hours.  CBG: No results for input(s): GLUCAP in the last 168 hours.  No results found for this or any previous visit (from the past 240 hour(s)).   Studies: No results found.  Scheduled Meds: . citalopram  40 mg Oral Daily  . clonazePAM  0.5 mg Oral Q6H  . docusate sodium  100 mg Oral BID  . feeding supplement  237 mL Oral BID BM  . melatonin  10 mg Oral QHS  . mirtazapine  30 mg Oral QHS  . multivitamin with minerals  1 tablet Oral Daily  . naltrexone  25 mg Oral Daily  . OXcarbazepine  150 mg Oral BID  . pantoprazole  40 mg Oral Daily  . polyethylene glycol  17 g Oral BID  . vitamin B-6  100 mg Oral Daily  .  thiamine  100 mg Oral Daily  . traZODone  150 mg Oral QHS  . ziprasidone  80 mg Oral 2 times per day   Continuous Infusions:   Principal Problem:   Organic brain syndrome Active Problems:   Polysubstance abuse (HCC)   Transaminitis   Refractory seizure (HCC)   Chronic hepatitis C without hepatic coma (HCC)   Distended abdomen   Palliative care encounter   Colonic Ileus (HCC)   Inadequate oral nutritional intake   Impulse disorder   Severe bipolar I disorder, current or most recent episode depressed (HCC)   Brain injury [facility])   Consultants:  Neurology  Psychiatry  Interventional radiology  Surgery    Procedures:  9/14 lumbar puncture  9/15 EEG  9/16 EEG  9/17 EEG  9/19 overnight EEG with video  9/22 overnight EEG  with video with discontinuation of long-term EEG monitoring on 9/25  9/24 core track placement  10/6 EEG  12/15 PEG tube removed    Antibiotics: Anti-infectives (From admission, onward)   Start     Dose/Rate Route Frequency Ordered Stop   05/11/20 0845  amoxicillin-clavulanate (AUGMENTIN) 500-125 MG per tablet 500 mg        1 tablet Oral Every 8 hours 05/11/20 0750 05/18/20 0559   05/11/20 0845  metroNIDAZOLE (FLAGYL) tablet 500 mg        500 mg Oral Every 8 hours 05/11/20 0750 05/18/20 0559   04/05/20 1415  levofloxacin (LEVAQUIN) tablet 500 mg        500 mg Oral Daily 04/05/20 1327 04/11/20 0911   03/19/20 1000  metroNIDAZOLE (FLAGYL) tablet 500 mg  Status:  Discontinued        500 mg Per Tube 2 times daily 03/19/20 0822 03/23/20 0827   03/18/20 1200  amoxicillin (AMOXIL) 250 MG/5ML suspension 500 mg  Status:  Discontinued        500 mg Per Tube Every 8 hours 03/18/20 0915 03/23/20 0559   03/17/20 1200  cefTRIAXone (ROCEPHIN) 1 g in sodium chloride 0.9 % 100 mL IVPB  Status:  Discontinued        1 g 200 mL/hr over 30 Minutes Intravenous Every 24 hours 03/17/20 1048 03/18/20 0913   03/16/20 1130  metroNIDAZOLE (FLAGYL) 50 mg/ml oral  suspension 500 mg  Status:  Discontinued        500 mg Per Tube 2 times daily 03/16/20 1040 03/19/20 0821   03/16/20 1130  fluconazole (DIFLUCAN) 40 MG/ML suspension 152 mg        150 mg Per Tube  Once 03/16/20 1040 03/16/20 1245   02/11/20 1526  ceFAZolin (ANCEF) IVPB 2g/100 mL premix        2 g 200 mL/hr over 30 Minutes Intravenous  Once 02/11/20 1526 02/11/20 1641   02/05/20 0600  ceFAZolin (ANCEF) IVPB 2g/100 mL premix        2 g 200 mL/hr over 30 Minutes Intravenous To Short Stay 02/04/20 1054 02/05/20 0950   01/29/20 0600  ceFAZolin (ANCEF) IVPB 2g/100 mL premix        2 g 200 mL/hr over 30 Minutes Intravenous On call to O.R. 01/28/20 1511 01/30/20 0559   01/28/20 2000  cefTRIAXone (ROCEPHIN) 1 g in sodium chloride 0.9 % 100 mL IVPB        1 g 200 mL/hr over 30 Minutes Intravenous Every 24 hours 01/28/20 1925 02/02/20 0724   01/06/20 0900  cefTRIAXone (ROCEPHIN) 2 g in sodium chloride 0.9 % 100 mL IVPB  Status:  Discontinued        2 g 200 mL/hr over 30 Minutes Intravenous Every 12 hours 01/06/20 0105 01/06/20 2202   01/06/20 0800  vancomycin (VANCOCIN) IVPB 1000 mg/200 mL premix  Status:  Discontinued       "Followed by" Linked Group Details   1,000 mg 200 mL/hr over 60 Minutes Intravenous Every 12 hours 01/05/20 1904 01/06/20 2202   01/05/20 2000  vancomycin (VANCOREADY) IVPB 1500 mg/300 mL       "Followed by" Linked Group Details   1,500 mg 150 mL/hr over 120 Minutes Intravenous  Once 01/05/20 1904 01/06/20 0127   01/05/20 1830  cefTRIAXone (ROCEPHIN) 2 g in sodium chloride 0.9 % 100 mL IVPB        2 g 200 mL/hr over 30 Minutes Intravenous  Once 01/05/20 1829 01/05/20 2220  Time spent: 20 minutes    [name] ANP  [facility] 7 am - 330 pm/M-F for direct patient care and secure chat Please see Amion for contact information 177 days

## 2020-07-01 NOTE — Progress Notes (Addendum)
1pm: Family team meeting was successful - patient to discharge home with her dad Dannielle Huh and step-mother [name] tomorrow 07/02/20. All questions answered and parents are agreeable to plan.  TOC obtained follow up appointments for patient including PCP at [facility], psychiatry at [facility], and neurocognitive evaluation at [facility]. If patient is granted a care coordinator through [facility] will contact [facility] directly.   10am: CSW completed care coordinator referral form for [facility]) to support this patient in the outpatient setting after discharge.  Edwin Dada, MSW, LCSW Transitions of Care  Clinical Social Worker II [phone]

## 2020-07-01 NOTE — Discharge Instructions (Signed)
[facility] Mobile Crisis Services Mobile Crisis: [phone] Office: [phone]

## 2020-07-01 NOTE — TOC Progression Note (Addendum)
Transition of Care [facility]) - Progression Note    Patient Details  Name: Arlyn Bumpus MRN: [id number] Date of Birth: 02-04-1986  Transition of Care [facility]) CM/SW [place], RN Phone Number: 07/01/2020, 11:52 AM  Clinical Narrative:    Case management met with the patient at the bedside this morning in regards to transitions of care to home.  TOC team and [facility] leadership with be having a family meeting with the family and patient at 1 pm to discuss discharge plans for home and discharge resources and support for outpatient support.  Case management spoke with Ardeen Jourdain NP and home health psychiatric nurse may be an additional opportunity for outpatient support.  I called and spoke with Tommi Rumps, [place] at [place] and they do not have home health psychiatric support at this time.  I spoke with Malachy Mood, [facility] and they do not have availability for psych RN for home health as well - facility only has psych RN in [place], [place].  I called and spoke with Ramond Marrow [facility] with [facility] and they are checking to see if they have home health psych RN.  CM will continue to follow for transitions of care to home.  CM spoke with [facility] - and Ramond Marrow, [place] and they do not have a psychiatric home health nurse.  Discussed lack of psychiatric home health RN's in the community with Erin Hearing, [place] order discontinued.  CM and MSW will continue to follow the patient for transitions of care to home.   Expected Discharge Plan: [place] Barriers to Discharge: Continued Medical Work up (Pending discharge to home tomorrow if cleared medically with hospitalist and GI consult.)  Expected Discharge Plan and Services Expected Discharge Plan: [place] In-house Referral: Clinical Social Work Discharge Planning Services: CM Consult Post Acute Care Choice: [place] arrangements for the past 2 months: Single Family Home                                Date [place]: 07/01/20 Time [place]: 1149 Representative spoke with at [place]: Ramond Marrow, [place] with [facility] is checking on availability of home health psych RN   Social Determinants of Health (SDOH) Interventions    Readmission Risk Interventions No flowsheet data found.

## 2020-07-02 MED ORDER — AMOXICILLIN-POT CLAVULANATE 875-125 MG PO TABS: 1.0000 | ORAL_TABLET | Freq: Two times a day (BID) | ORAL | 0 refills | Status: AC

## 2020-07-02 MED ORDER — AMOXICILLIN-POT CLAVULANATE 875-125 MG PO TABS
1.0000 | ORAL_TABLET | Freq: Two times a day (BID) | ORAL | Status: DC
  Administered 2020-07-02: 1 via ORAL
  Filled 2020-07-02: qty 1

## 2020-07-02 MED ORDER — BIOTENE DRY MOUTH MT LIQD: 15.0000 mL | OROMUCOSAL | 0 refills | Status: DC | PRN

## 2020-07-02 MED ORDER — BIOTENE DRY MOUTH MT LIQD: 15.0000 mL | OROMUCOSAL | Status: DC | PRN

## 2020-07-02 NOTE — Progress Notes (Signed)
Patient complained of a toothache. She stated that she thinks it might be an abscess. Patient given Ibuprofen as per PRN order. Patient is currently asleep.

## 2020-07-02 NOTE — Discharge Summary (Addendum)
Physician Discharge Summary  Anita Davis ZOX:[id number] DOB: 21-Nov-1985 DOA: 01/04/2020  PCP: [facility] [place] [facility]    Admit date: 01/04/2020 Discharge date: 07/02/2020  Time spent: 45 minutes  Recommendations for Outpatient Follow-up:  1. Patient will receive psychiatric follow-up at [facility] in [facility].  Her appointment is March 14. 2. Patient presented with status epilepticus and associated brain injury.  An ambulatory referral has been sent to [facility] for patient undergo neurocognitive evaluation in the outpatient setting; she is also been referred to the brain injury clinic for physician evaluation as well. 3.   Patient is on multiple psychotropic medications to manage her severe bipolar disorder with schizoaffective features as well as to manage behavior sequela related to brain injury.  Initial prescriptions will be filled by [facility] with subsequent prescriptions being sent to patient's community pharmacy of choice. 4.   Application has been submitted to [facility].  This person will be responsible for evaluation and eventual referral to a family care home/group home after discharge. 5.    Patient has been set up with primary care in [facility].  Appointment date and time are included on the discharge instructions. 6-Patient will need LFT in 48 hours.  7-She will need referral to Dentist.   Discharge Diagnoses:  Principal Problem:   Organic brain syndrome Active Problems:   Polysubstance abuse (HCC)   Transaminitis   Refractory seizure (HCC)   Chronic hepatitis C without hepatic coma (HCC)   Distended abdomen   Palliative care encounter   Colonic Ileus (HCC)   Inadequate oral nutritional intake   Impulse disorder   Severe bipolar I disorder, current or most recent episode depressed (HCC)   Brain injury [facility])   Discharge Condition: Medically stable  Diet recommendation: Regular       Filed Weights   06/26/20  0612 06/27/20 0515 07/02/20 0434  Weight: 83.2 kg 83.5 kg 83.6 kg    History of present illness:  35 year old female with history of IV drug abuse/heroin on methadone prior to admission, hepatitis C, anxiety with depression and gestational diabetes. She presented to [facility] ER 3 times prior to admission. On her 4th presentation she was admitted. On 9/2 she was diagnosed with acute cystitis with hematuria and was discharged home on Keflex and Zofran. 9/9she presented with anxiety and symptoms consistent with a panic attack. She was discharged from the ER with prescription for Vistaril. She again presented to the ER on9/10reporting significant anxiety and severe insomnia and stating the Vistaril was not working. She was given Ativan while in the ER and this appeared to improve her symptoms. She was stable for discharge and instructed to take Benadryl at bedtime to assist with her insomnia.   On 9/13patient brought to the ER by family. She was refusing to answer questions. Clinician documented sores/lesions on her legs consistent with methamphetamine use. When questioned regarding this patient reportedly just stared at the clinician. There were some concerns that patient may have been abusing methamphetamine based on these clinical findings but was never confirmed. Of note UDS was negative for methamphetamines. Prior to discharge from the ER patient appeared to be hallucinating but was able to verbally communicate. Over the next hour she became more agitated and was given Ativan 2 mg orally. She later returned to her baseline mentation stating she did not wish to hurt herself or anybody else and requested to be discharged home. She was referred to [facility] and discharged by EDP. Later that  same daypatient was returned to the ER by the police. Patient was found laying down in a parking lot and acting bizarre. She apparently told the police that she had put "meth" in her pants.  Patient kept reaching for vagina and it was suspected she may have placed drugs intravaginally. No drugs were ever found. EEG and MRI were completed with no acute abnormalities; urine drug screen positive for THC as well as BZDs (which had been given in the ED). She underwent a lumbar puncture which was negative and was empirically started on antibiotics to treat potential infectious etiology to her encephalopathy.  After admission this patient developed seizure activity which progressed to status epilepticus therefore she was transferred to [facility] for further neurological evaluation. After several days recurrent seizures were aborted with combination of 3 antiepileptic drugs. 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 Unfortunately she did not improve. Because of persistent altered mentation and inability to sustain appropriate oral intake a PEG tube was placed. This tube was eventually discontinued after patient became alert and awake enough to eat consistently during the latter portion of the hospitalization. Of note patient has had episodes of increasing mental alertness and clarity and as of 1/12 patient confirmed that she had abruptly stopped taking her prescribed methadone prior to presentations to the ER and this likely explains some of her abnormal behaviors (methadone withdrawal in conjunction with bipolar disorder) and her status epilepticus (methadone withdrawal)  Since admission she has not had any further seizures so these medications were discontinued. She has eventually awakened behaviors alternating between awake and appropriate, awake crying and depressed occasionally delusional, severe agitation and aggression with significant delusions. She has been evaluated both by neurology and psychiatry who suspect a combination of sequelae from underlying brain injury as well as  bipolar disorder with schizoaffective features. During one of her alert phases patient did admit to being diagnosed with bipolar disease prior to admission. Patient is on multiple psychotropic medications continued to have episodes of violent outbursts. She had required frequent doses of IM Geodon as well as Recruitment consultant and frequent intervention by security staff during her episodes of severe agitation. Recent chart review by [facility] states patient would not be a candidate for ECT given recent brain injury sequela as well as presentation with status epilepticus.  Has been stable regarding extreme agitation and/or violent behaviors since 1/24 when she had received her last Geodon IM dose. On the evening of 2/4 she was also given a dose of IM Geodon. There was no chart documentation stating what type of behavior she was experiencing but typically this medication is only given if she has extreme agitation.  Patient has been followed regularly during the hospitalization by our inpatient psychiatric team. As of 3/4 the team determined that there would be no benefit in transferring patient to [facility] given her extremes in behavior have resolved. Decision made at that time to allow IVC to expire on 3/8 and to remove sitter from bedside. Plan at this time is to discharge patient to home environment. Patient now has active Medicaid in [facility] and this would allow her to obtain appropriate medications at discharge. As of 3/10 patient has a psychiatrist appointment scheduled for March 24 with [name] in [facility], we have also sent ambulatory referrals to [facility] for neurocognitive evaluation with her psychology team as well as follow-up with her brain injury physician for brain injury  rehab.  Extensive conference was held per myself, Marcelino Duster Case Production designer, theatre/television/film and [place].  A safety plan was explored and discussed with the family and will be included in the discharge information.  Discussion regarding  medications also help with family.  TOC provided initial prescriptions and have been dispensed to patient's family today.  Additional prescriptions have been electronically submitted to her local pharmacy in [place] city.  Patient will discharge home on 3/11 after the home has been prepared for [facility] to arrive including the recommended safety precautions.    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 During periods of alertness and lucidity patient denied use of methamphetamines prior to admission although did admit to abruptly stopping her methadone -Remains intermittently oriented with episodes of delusion and lack of insight into her current psychiatric status -EKG on 3/10 with QTC of 430 MS -Continue max dose Geodon 80 mg BID, naltrexone, scheduled Klonopin, Celexa and Remeron at hour of sleep -3/9: Due to worsening transaminitis stable total bilirubin discussed with psych team and Tegretol discontinued on 3/9.started on Trileptal 150 mg twice daily to be continued after discharge -On 3/4 was reevaluated by psychiatric team who now recommend discharge to home and allow current IVC to expire and discontinuation of safety sitter. -Ambulatory referral to [facility] sent in regards to patient requirement for neurocognitive evaluation in the outpatient setting rehab physician follow-up in regards to her brain injury -Patient has been given an appointment with [facility] psychiatrist for Monday 3/14 if she is  able to discharge over the weekend. If family continues to have concerns over discharge will cancel this appointment and reschedule once definitive discharge date determined Drug overdose probably   Worsening transaminitis in context of hepatitis C -Elevated HCV antibody with markedly elevated HCV RNA quantitative level  consistent with chronic hepatitis C  -11/10: Discussed with ID Dr. Manson Passey. Treatment of hepatitis C typically is initiated in the outpatient setting. Medications can be quite expensive and usually take time to obtain insurance approval.  -ID recommends scheduling outpatient appointment their clinic closer to discharge date -Abdominal ultrasound unremarkable -AST and ALT continue to rise with stable total bilirubin 1.4 AS OF 3/10. -Discussed with psych team and decision made to discontinue Tegretol on 3/9 -On 3/10 discussed with pharmacist and according to the literature Tegretol associated liver toxicity appears to take 2 - 3 weeks to start to see LFTs coming down and then complete resolution in ~ 3 months. -Patient also evaluated by gastroenterology on 3/11.  Cleared to discharge and okay for additional outpatient follow-up regarding LFTs. -Needs LFT in 48 hours   Severe insomnia -Continue Remeron, trazodone and melatonin  History of polysubstance abuse with heroin/history of outpatient methadone treatment -[place]Continue naltrexone to treat heroin addiction as well as recent issues regarding impulsivity and hypersexual behaviors.  It is suspected that these issues are related to brain injury and worsening of underlying psychiatric condition  Depression and severe anxiety disorder/PTSD/no bipolar disorder now with schizoaffective features -Extremes in behavior have significantly improved-no longer under IVC -See  above regarding pharmacotherapy  Recurrent constipation -Continue Colace  100 mg twice daily prn -Continue MiraLAX to twice daily  Chest pain secondary to reflux -Cont Protonix daily  Pelvic pain -Pelvic ultrasound unremarkable and symptoms seem to improve after voiding   Other problems: Status epilepticus -Resolved -Suspect precipitated by abrupt withdrawal of methadone as reported by patient -Repeat MRI on 12/28 within normal limits and it is felt behavioral issues related to brain injury sequelae and chronic psychiatric condition. Neurology has signed off -Vimpat, Dilantin and phenobarbital have been tapered and discontinued at recommendation of neurologist  Recurrent UTI during hospitalization -Patient with low-grade fever overnight and is now having back pain for days not responsive to NSAIDs and muscle relaxers -DG lumbar spine unremarkable -During hospitalization patient has been treated for pansensitive E. coli UTI as well as Salmonella UTI both of which have been sensitive to Levaquin -Completed Levaquin 12/20-urine culture obtained after antibiotics initiated and showed no growth -CT abdomen and pelvis on 12/17 unremarkable  Recurrent back pain -Continue OTC ibuprofen after discharge -Prescription given for Robaxin as needed  Salmonella UTI -Completed amoxicillin  Physical deconditioning -Resolved -Does continue to exhibit impulsivity but maintains appropriate balance and gait no longer requires one-to-one safety sitter  Large hemorrhoids. -Resolved -Markedly improved-LD Proctofoam 12/15  Dysphagia -Resolved -Continue regular diet  Oral pain; she has cavities and poor dentition. No abscess notice on oral exam today.  Started on augmentin for 5 days, Mouth wash.  Needs follow up with dentist.  Of noted she has had similar complaints during admission, CT maxillo was negative for abscess.   Procedures:  9/14 lumbar puncture  9/15 EEG  9/16 EEG  9/17 EEG  9/19 overnight EEG with video  9/22 overnight EEG with  video with discontinuation of long-term EEG monitoring on 9/25  9/24 core track placement  10/6 EEG  12/15 PEG tube removed  Consultations:  Neurology  Psychiatry  Interventional radiology  Surgery   Discharge Exam:     Vitals:   07/01/20 2123 07/02/20 0434  BP: 100/68 102/77  Pulse: 62 (!) 55  Resp: 18 18  Temp: 98 F (36.7 C) 97.8 F (36.6 C)  SpO2: 98% 98%   General:Awake, alert and calm in no acute distress Pulmonary:Bilateral lung sounds clear to auscultation, stable on room air Cardiac:Heart sounds are S1-S2, pulses regular, extremities warm with appropriate capillary refill Abdomen:Soft nontender, nondistended with excellent oral intake.  LBM 3/7 Neurological:Moves all extremities x4, no focal neurological deficits, no tremors or rigidity/EPS sx's Psychiatric: Awake, alert and oriented x3.  Earlier in the day on 3/11 app provider gave [facility] a list of requirements that she would have to adhere to at home.  When provider return to the room with patient's parents on the afternoon of 3/11 patient was able to 100% repeat back all of these requirements.   Discharge Instructions       Discharge Instructions    Ambulatory referral to Physical Medicine Rehab   Complete by: As directed    Patient presented with status epilepticus and clinical evaluation consistent with associated brain injury.  During hospitalization I discussed this patient's case with Pam as well as Dr. Riley Kill who agreed patient would benefit from neurocognitive evaluation after discharge to home.  In addition her underlying bipolar disorder has been affected by this brain injury and she now has schizoaffective features is on a multitude of medications for this   Ambulatory referral to Physical Medicine Rehab   Complete by: As directed  OOPS! Left out contact info-pt's dad and step mom are her guardians-please contact Anita Davis at [phone] with appointment date and  time-THANKS!!!   Ambulatory referral to Physical Medicine Rehab   Complete by: As directed    Pt admitted with status epilepticus and associated brain injury.  Patient with known history of bipolar disorder and brain injury has exacerbated underlying psychiatric issues and had been experiencing schizoaffective symptoms during the hospitalization.  I had previously spoken with Pam as well as Dr. Riley KillSwartz who recommended outpatient follow-up with rehabilitation physician regarding management of brain injury sequela.  In addition patient has her dad and stepmom as a guardian.  Please contact Anita Davis (stepmom) at [phone] to arrange appointment date and time   Diet - low sodium heart healthy   Complete by: As directed    Diet general   Complete by: As directed    Discharge instructions   Complete by: As directed    1.  In preparation for bringing [place] home please try to provide a safe environment as we discussed.  Initially until we are assured Anita ChessmanMandy will not demonstrate self-harm behaviors it is recommended that you lock up all knives and sharp objects as well as guns in the gun safe or other secure location.  Please lock up all of her medications and administer to her.  Do not allow her to self administer her medications.  Since Anita ChessmanMandy is on multiple medications you likely will need to purchase a notebook or other type of medication organizer to keep up with when medications are due and when they have been given. 2.  It is also recommended that you obtain some sort of alarm device that will activate when doors or windows are opened. 3.  As previously discussed you are in control of Anita Davis's behaviors.  You have the right as her guardians to not allow certain people to visit.  If Anita ChessmanMandy has a mental break/behavioral manifestation that is concerning for harm to you or to herself please secure Anita ChessmanMandy in a safe location.  If you feel Anita ChessmanMandy may hurt you secure yourself in a safe location.   Please contact police.  If Anita ChessmanMandy does become violent you will need to likely pursue IVC and hospitalization. 4.  With her brain injury Anita ChessmanMandy can be like a dementia patient although her short-term memory varies.  As you saw when we discussed with her the discharge plan and expectations she was able to repeat back to me exactly what I had told her earlier in the day.  Safety options that are used for dementia patients will be helpful in managing Anita Davis. 5.  After discharge her psychiatrist will be responsible for assisting in managing her medications as well as giving direction as to managing Anita Davis's day-to-day behaviors, etc.  She has also been referred to [facility] neurocognitive clinic as well as to the brain injury clinic.  These providers will also assist in helping manage his recovery. 6.  As we have discussed Anita ChessmanMandy has 2 major triggers: Talking about her children and religion.  You likely will receive direction from the psychiatrist and other providers after discharge as to when Anita ChessmanMandy can potentially attend church.  Her children were a significant trigger therefore it may take some time before the subject of her children can be discussed with her. 7.  Prior to discharge and application was completed and faxed to a care coordinator.  This person is responsible for assisting the family in regards to  evaluation and potential placement in a family care home/group home after discharge.  This process can take some time.  The care coordinator will eventually contact you with further details after discharge.   Discharge wound care:   Complete by: As directed    As above   Increase activity slowly   Complete by: As directed    Increase activity slowly   Complete by: As directed    No wound care   Complete by: As directed      Allergies as of 07/02/2020   No Known Allergies              Medication List        STOP taking these medications       cephALEXin  500 MG capsule Commonly known as: KEFLEX   cetirizine 10 MG tablet Commonly known as: ZYRTEC   hydrOXYzine 25 MG tablet Commonly known as: ATARAX/VISTARIL   ibuprofen 200 MG tablet Commonly known as: ADVIL   METHADONE HCL PO   ondansetron 4 MG disintegrating tablet Commonly known as: ZOFRAN-ODT             TAKE these medications       amoxicillin-clavulanate 875-125 MG tablet Commonly known as: AUGMENTIN Take 1 tablet by mouth every 12 (twelve) hours for 5 days.   antiseptic oral rinse Liqd 15 mLs by Mouth Rinse route as needed for dry mouth.   citalopram 40 MG tablet Commonly known as: CELEXA Take 1 tablet (40 mg total) by mouth daily.   clonazePAM 0.5 MG tablet Commonly known as: KLONOPIN Take 1 tablet (0.5 mg total) by mouth every 6 (six) hours.   docusate sodium 100 MG capsule Commonly known as: COLACE Take 1 capsule (100 mg total) by mouth 2 (two) times daily.   Melatonin 10 MG Tabs Take 10 mg by mouth at bedtime.   methocarbamol 500 MG tablet Commonly known as: ROBAXIN Take 1 tablet (500 mg total) by mouth every 6 (six) hours as needed for muscle spasms.   mirtazapine 30 MG tablet Commonly known as: REMERON Take 1 tablet (30 mg total) by mouth at bedtime.   multivitamin with minerals Tabs tablet Take 1 tablet by mouth daily.   naltrexone 50 MG tablet Commonly known as: DEPADE Take 0.5 tablets (25 mg total) by mouth daily.   OXcarbazepine 150 MG tablet Commonly known as: TRILEPTAL Take 1 tablet (150 mg total) by mouth 2 (two) times daily.   pantoprazole 40 MG tablet Commonly known as: PROTONIX Take 1 tablet (40 mg total) by mouth daily.   polyethylene glycol 17 g packet Commonly known as: MIRALAX / GLYCOLAX Take 17 g by mouth 2 (two) times daily. What changed:   medication strength  how much to take  when to take this   pyridoxine 100 MG tablet Commonly known as: B-6 Take 1 tablet (100 mg total) by mouth  daily.   thiamine 100 MG tablet Take 1 tablet (100 mg total) by mouth daily.   traZODone 150 MG tablet Commonly known as: DESYREL Take 1 tablet (150 mg total) by mouth at bedtime.   ziprasidone 80 MG capsule Commonly known as: GEODON Take 1 capsule (80 mg total) by mouth 2 (two) times daily with a meal.                        Discharge Care Instructions  (From admission, onward)  Start     Ordered    07/02/20 0000  Discharge wound care:       Comments: As above   07/02/20 0848           No Known Allergies      Follow-up Information        [facility]. Go on 07/15/2020.   Specialty: Family Medicine Why: You are scheduled for a hospital follow up on July 15, 2020 on 10 am.   Contact information: [address] [place] [address] [phone]             [facility] Follow up.   Specialty: Rehabilitation Why: The outpatient PT/OT clinic will be calling to schedule outpatient therapy times. Contact information: [address] [id number] mc [address] [place] [address] [phone]            [facility], [name] Follow up.   Why: You have an appointment scheduled with the [facility] in [place], [place] location - scheduled for Monday, 07/04/2020 at 0830.  Please be at the appointment at 0800 to fill out necessary paperwork - [phone], [address] [place], [place] Contact information: [address] [place] [address] [phone]             [facility]. Call.   Specialty: Physical Medicine and Rehabilitation Why: Ambulatory referrals have been sent to the neuro psychologist and the brain injury rehab doctor. They will call you with appointment date and time Contact information: [address] [place] [address] [id number] mc [place]  [address] [phone]                The results of significant diagnostics from this hospitalization (including imaging, microbiology, ancillary and laboratory) are listed below for reference.    Significant Diagnostic Studies:  Imaging Results  US Abdomen Complete  Result Date: 06/22/2020 CLINICAL DATA:  Elevated transaminase levels. EXAM: ABDOMEN ULTRASOUND COMPLETE COMPARISON:  None. FINDINGS: Gallbladder: Contracted but otherwise unremarkable. No gallstones or wall thickening visualized. No sonographic Murphy sign noted by sonographer. Common bile duct: Diameter: 4 mm Liver: No focal lesion identified. Within normal limits in parenchymal echogenicity. Portal vein is patent on color Doppler imaging with normal direction of blood flow towards the liver. IVC: No abnormality visualized. Pancreas: Visualized portion unremarkable. Spleen: Size and appearance within normal limits. Right Kidney: Length: 9.6 cm. Echogenicity within normal limits. No mass or hydronephrosis visualized. Left Kidney: Length: 11.4 cm. Echogenicity within normal limits. No mass or hydronephrosis visualized. Abdominal aorta: No aneurysm visualized. Distal aorta and bifurcation not seen due to overlying bowel gas. Other findings: None. IMPRESSION: No abnormality identified. Electronically Signed   By: Bary Richard M.D.   On: 06/22/2020 17:45   US PELVIC COMPLETE W TRANSVAGINAL AND TORSION R/O  Result Date: 06/24/2020 CLINICAL DATA:  Pelvic pain for 2 days, uncertain LMP EXAM: TRANSABDOMINAL AND TRANSVAGINAL ULTRASOUND OF PELVIS DOPPLER ULTRASOUND OF OVARIES TECHNIQUE: Both transabdominal and transvaginal ultrasound examinations of the pelvis were performed. Transabdominal technique was performed for global imaging of the pelvis including uterus, ovaries, adnexal regions, and pelvic cul-de-sac. It was necessary to proceed with endovaginal exam following the transabdominal exam to visualize the uterus, endometrium,  and ovaries. Color and duplex Doppler ultrasound was utilized to evaluate blood flow to the ovaries. COMPARISON:  None FINDINGS: Uterus Measurements: 8.3 x 4.6 x 5.4 cm = volume: 108 mL. Variable position, flipped from anteverted to retroverted during exam.  No focal mass. Endometrium Thickness: 14 mm.  No endometrial fluid or focal abnormality Right ovary Measurements: 3.4 x 2.9 x 2.6 cm = volume: 13.2 mL. Normal morphology without mass. Blood flow present within RIGHT ovary on color Doppler imaging. Left ovary Measurements: 2.3 x 1.5 x 1.2 cm = volume: 2.3 mL. Normal morphology without mass. Blood flow present within LEFT ovary on color Doppler imaging. Pulsed Doppler evaluation of both ovaries demonstrates normal low-resistance arterial and venous waveforms. Other findings No free pelvic fluid.  No adnexal masses. IMPRESSION: Normal exam. No evidence of ovarian mass or torsion. Electronically Signed   By: Ulyses Southward M.D.   On: 06/24/2020 10:13     Microbiology: No results found for this or any previous visit (from the past 240 hour(s)).   Labs: Basic Metabolic Panel: Last Labs          Recent Labs  Lab 06/26/20 0051 06/28/20 0032 06/29/20 0129 06/30/20 0119 07/01/20 0148  NA 138 137 138 138 137  K 4.0 3.8 4.0 3.9 4.0  CL 104 103 103 103 104  CO2 GLUCOSE 123* 96 101* 110* 93  BUN CREATININE 0.65 0.68 0.71 0.71 0.72  CALCIUM 9.0 8.8* 9.2 8.9 9.0     Liver Function Tests: Last Labs          Recent Labs  Lab 06/26/20 0051 06/28/20 0032 06/29/20 0129 06/30/20 0119 07/01/20 0148  AST 164* 206* 296* 348* 318*  ALT 147* 154* 193* 226* 230*  ALKPHOS 112 99 101 117 117  BILITOT 1.5* 1.8* 1.5* 1.4* 1.6*  PROT 5.8* 5.8* 6.0* 6.0* 6.1*  ALBUMIN 3.3* 3.3* 3.4* 3.4* 3.4*     Last Labs   No results for input(s): LIPASE, AMYLASE in the last 168 hours.   Last Labs   No results for input(s): AMMONIA in the last 168 hours.   CBC: Last Labs   No  results for input(s): WBC, NEUTROABS, HGB, HCT, MCV, PLT in the last 168 hours.   Cardiac Enzymes: Last Labs   No results for input(s): CKTOTAL, CKMB, CKMBINDEX, TROPONINI in the last 168 hours.   BNP: BNP (last 3 results) Recent Labs (within last 365 days)  No results for input(s): BNP in the last 8760 hours.    ProBNP (last 3 results) Recent Labs (within last 365 days)  No results for input(s): PROBNP in the last 8760 hours.    CBG: Last Labs   No results for input(s): GLUCAP in the last 168 hours.       Signed:  Alba Cory MD [facility] 07/02/2020, 8:52 AM        Revision History                             Routing History              Note Details  Author Alba Cory, MD File Time 07/02/2020 8:53 AM  Author Type Physician Status Addendum  Last Editor Alba Cory, MD Service Internal Medicine  Hospital Acct # [id number] Admit Date 01/04/2020

## 2020-07-02 NOTE — Progress Notes (Signed)
[name] to be D/C'd  per MD order.  Discussed with the patient, step mother and father all questions fully answered.  VSS, Skin clean, dry and intact without evidence of skin break down, no evidence of skin tears noted.   An After Visit Summary was printed and given to the patient's caregiver. Received prescription.  D/c education completed with patient/family including follow up instructions, medication list, d/c activities limitations if indicated, with other d/c instructions as indicated by MD - able to verbalize understanding, all questions fully answered.   Patient/family instructed to return to ED, call 911, or call MD for any changes in condition.   Patient to be escorted via WC, and D/C home via private auto.

## 2020-07-07 ENCOUNTER — Encounter: Payer: Self-pay | Admitting: Psychology

## 2020-08-08 ENCOUNTER — Encounter: Payer: Self-pay | Admitting: Psychology

## 2020-08-08 ENCOUNTER — Other Ambulatory Visit: Payer: Self-pay

## 2020-08-08 ENCOUNTER — Encounter: Payer: Medicaid Other | Attending: Psychology | Admitting: Psychology

## 2020-08-08 DIAGNOSIS — F1910 Other psychoactive substance abuse, uncomplicated: Secondary | ICD-10-CM | POA: Insufficient documentation

## 2020-08-08 DIAGNOSIS — F314 Bipolar disorder, current episode depressed, severe, without psychotic features: Secondary | ICD-10-CM | POA: Diagnosis not present

## 2020-08-08 DIAGNOSIS — F639 Impulse disorder, unspecified: Secondary | ICD-10-CM | POA: Insufficient documentation

## 2020-08-08 DIAGNOSIS — F09 Unspecified mental disorder due to known physiological condition: Secondary | ICD-10-CM | POA: Diagnosis not present
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
## 2020-08-22 ENCOUNTER — Other Ambulatory Visit: Payer: Self-pay

## 2020-08-22 ENCOUNTER — Encounter: Payer: Medicaid Other | Attending: Physical Medicine & Rehabilitation | Admitting: Physical Medicine & Rehabilitation

## 2020-08-22 ENCOUNTER — Encounter: Payer: Self-pay | Admitting: Physical Medicine & Rehabilitation

## 2020-08-22 VITALS — BP 107/75 | HR 70 | Temp 98.1°F | Ht 63.0 in | Wt 190.2 lb

## 2020-08-22 DIAGNOSIS — F314 Bipolar disorder, current episode depressed, severe, without psychotic features: Secondary | ICD-10-CM | POA: Diagnosis present

## 2020-08-22 DIAGNOSIS — F1910 Other psychoactive substance abuse, uncomplicated: Secondary | ICD-10-CM | POA: Insufficient documentation

## 2020-08-22 DIAGNOSIS — F09 Unspecified mental disorder due to known physiological condition: Secondary | ICD-10-CM | POA: Insufficient documentation

## 2020-08-22 NOTE — Progress Notes (Signed)
Subjective:    Patient ID: Anita Davis, female    DOB: 30-Mar-1986, 35 y.o.   MRN: [id number]  HPI Female with pmh of Etoh abuse, bipolar disorder, anxiety, depression, schizoaffective disorder presents for evaluation of ?TBI. Step-mom supplements history. Started 12/2019. She does recall what happens, believe she was found seizures by her roommate.  She was taking to the hospital. She was induced coma. She had a PEG tube and in the hospital one day, started speaking and eating. She remained disoriented for some time. She slowly improved an was released 6 months later, ~06/2020.  She states she is doing well since discharge.  She is gardening, going to church.  She states she is doing much better.  Denies falls. Denies associated PTSD. She is on disability, staying active, no changes since seizures.   Pain Inventory Average Pain 5 Pain Right Now 2 My pain is constant and dull  LOCATION OF PAIN Top of head and lower back  BOWEL Number of stools per week: 7 Oral laxative use Yes  Type of laxative Miralax & pills Enema or suppository use Yes  History of colostomy No  Incontinent No   BLADDER Normal In and out cath, frequency N/A Able to self cath No  Bladder incontinence No  Frequent urination No  Leakage with coughing No  Difficulty starting stream No  Incomplete bladder emptying No    Mobility walk without assistance ability to climb steps?  yes do you drive?  no Do you have any goals in this area?  yes  Function disabled: date disabled 2021  Neuro/Psych weakness numbness tingling depression anxiety  Prior Studies Any changes since last visit?  no New Patient  Physicians involved in your care Any changes since last visit?  no New Patient   Family History  Problem Relation Age of Onset  . Depression Mother   . Cervical cancer Mother   . Hypertension Father   . Diabetes Father    Social History   Socioeconomic History  . Marital status: Single     Spouse name: Not on file  . Number of children: Not on file  . Years of education: Not on file  . Highest education level: Not on file  Occupational History  . Not on file  Tobacco Use  . Smoking status: Former Smoker    Years: 24.00    Types: Cigarettes, Cigars  . Smokeless tobacco: Never Used  . Tobacco comment: 1 cigar/daily. former 1 pack cigarette smoker quit 05/2019  currently smokes CBD  Vaping Use  . Vaping Use: Every day  . Substances: Nicotine, Flavoring  Substance and Sexual Activity  . Alcohol use: No  . Drug use: Not Currently    Types: IV, Cocaine, Hydrocodone, Oxycodone, Marijuana, Heroin    Comment: takes methadone- now taking subutex (03/27/19)  . Sexual activity: Not Currently    Birth control/protection: Abstinence  Other Topics Concern  . Not on file  Social History Narrative  . Not on file   Social Determinants of Health   Financial Resource Strain: Not on file  Food Insecurity: Not on file  Transportation Needs: Not on file  Physical Activity: Not on file  Stress: Not on file  Social Connections: Not on file   Past Surgical History:  Procedure Laterality Date  . HEMORRHOID BANDING  age 5  . IR GASTROSTOMY TUBE REMOVAL  04/06/2020  . IR REPLC GASTRO/COLONIC TUBE PERCUT W/FLUORO  02/11/2020  . LAPAROSCOPIC GASTROSTOMY N/A 02/05/2020   Procedure: LAPAROSCOPIC  GASTROSTOMY PLACEMENT;  Surgeon: Gaynelle Adu, MD;  Location: [facility] OR;  Service: General;  Laterality: N/A;   Past Medical History:  Diagnosis Date  . Allergy   . Bronchitis   . Hepatitis-C   . History of gestational diabetes   . HPV in female   . Substance abuse (HCC)    BP 107/75   Pulse 70   Temp 98.1 F (36.7 C)   Ht 5\' 3"  (1.6 m)   Wt 190 lb 3.2 oz (86.3 kg)   SpO2 98%   BMI 33.69 kg/m   Opioid Risk Score:   Fall Risk Score:  `1  Depression screen PHQ 2/9  Depression screen [facility] 2/9 08/22/2020 08/22/2020  Decreased Interest 1 0  Down, Depressed, Hopeless 1 0  PHQ - 2 Score 2  0  Altered sleeping 1 -  Tired, decreased energy 2 -  Change in appetite 3 -  Feeling bad or failure about yourself  3 -  Trouble concentrating 3 -  Moving slowly or fidgety/restless 1 -  Suicidal thoughts 0 -  PHQ-9 Score 15 -   Review of Systems  HENT: Positive for dental problem.   Gastrointestinal: Positive for constipation.  Musculoskeletal: Positive for back pain.  Neurological: Positive for weakness, numbness and headaches.       Tingling  Psychiatric/Behavioral: Positive for dysphoric mood.       Anxiety  All other systems reviewed and are negative.     Objective:   Physical Exam Constitutional: No distress . Vital signs reviewed. HENT: Normocephalic.  Atraumatic. Eyes: EOMI. No discharge. Cardiovascular: No JVD.   Respiratory: Normal effort.  No stridor.   GI: Non-distended.   Skin: Warm and dry.  Intact. Psych: Normal mood.  Normal behavior. Musc: No edema in extremities.  No tenderness in extremities. Neuro: Alert Motor: Grossly intact Attention/Concentration: Limited premorbid Able to spell backwards, baseline limitations Recall 2/3 after 5 minutes    Assessment & Plan:  Female with pmh of Etoh abuse, bipolar disorder, anxiety, depression, schizoaffective disorder presents for evaluation of ?TBI.  1. S/p status epilepticus   MRI limited, but unremarkable  Labs reviewed  Chart/Referral information reviewed  NCCSRS reviewed  Klonopin being weaned  Patient and step mother believe she is doing well.   Improving  2. Sleep disturbance  Controlled  3. Morbid Obesity  Attempting to lose weight   >30 minutes spent in total in reviewing records, labs, image report review, discussing etiology with patient and family, current level of functioning, overall health discussions

## 2020-10-17 ENCOUNTER — Ambulatory Visit: Payer: Medicaid Other | Admitting: Psychology

## 2020-10-20 ENCOUNTER — Encounter: Payer: Medicaid Other | Attending: Physical Medicine & Rehabilitation

## 2020-10-20 DIAGNOSIS — F314 Bipolar disorder, current episode depressed, severe, without psychotic features: Secondary | ICD-10-CM | POA: Insufficient documentation

## 2020-10-20 DIAGNOSIS — F1910 Other psychoactive substance abuse, uncomplicated: Secondary | ICD-10-CM | POA: Insufficient documentation

## 2020-10-27 ENCOUNTER — Emergency Department (HOSPITAL_COMMUNITY)
Admission: EM | Admit: 2020-10-27 | Discharge: 2020-10-27 | Disposition: A | Discharge status: Home / Self Care | Payer: Medicaid Other | Attending: Emergency Medicine | Admitting: Emergency Medicine

## 2020-10-27 ENCOUNTER — Encounter (HOSPITAL_COMMUNITY): Payer: Self-pay | Admitting: Emergency Medicine

## 2020-10-27 ENCOUNTER — Other Ambulatory Visit: Payer: Self-pay

## 2020-10-27 DIAGNOSIS — R197 Diarrhea, unspecified: Secondary | ICD-10-CM | POA: Diagnosis not present

## 2020-10-27 DIAGNOSIS — Z87891 Personal history of nicotine dependence: Secondary | ICD-10-CM | POA: Insufficient documentation

## 2020-10-27 DIAGNOSIS — K625 Hemorrhage of anus and rectum: Secondary | ICD-10-CM | POA: Diagnosis not present

## 2020-10-27 DIAGNOSIS — R112 Nausea with vomiting, unspecified: Secondary | ICD-10-CM | POA: Diagnosis present

## 2020-10-27 DIAGNOSIS — R519 Headache, unspecified: Secondary | ICD-10-CM | POA: Diagnosis not present

## 2020-10-27 LAB — COMPREHENSIVE METABOLIC PANEL
ALT: 54 U/L — ABNORMAL HIGH (ref 0–44)
AST: 53 U/L — ABNORMAL HIGH (ref 15–41)
Albumin: 4.3 g/dL (ref 3.5–5.0)
Alkaline Phosphatase: 67 U/L (ref 38–126)
Anion gap: 8 (ref 5–15)
BUN: 15 mg/dL (ref 6–20)
CO2: 24 mmol/L (ref 22–32)
Calcium: 9.4 mg/dL (ref 8.9–10.3)
Chloride: 105 mmol/L (ref 98–111)
Creatinine, Ser: 0.82 mg/dL (ref 0.44–1.00)
GFR, Estimated: >60 mL/min (ref >60)
Glucose, Bld: 109 mg/dL — ABNORMAL HIGH (ref 70–99)
Potassium: 3.5 mmol/L (ref 3.5–5.1)
Sodium: 137 mmol/L (ref 135–145)
Total Bilirubin: 0.7 mg/dL (ref 0.3–1.2)
Total Protein: 7.4 g/dL (ref 6.5–8.1)

## 2020-10-27 LAB — CBC WITH DIFFERENTIAL/PLATELET
Abs Immature Granulocytes: 0 10*3/uL (ref 0.00–0.07)
Basophils Absolute: 0 10*3/uL (ref 0.0–0.1)
Basophils Relative: 1 %
Eosinophils Absolute: 0 10*3/uL (ref 0.0–0.5)
Eosinophils Relative: 0 %
HCT: 41.9 % (ref 36.0–46.0)
Hemoglobin: 14.1 g/dL (ref 12.0–15.0)
Immature Granulocytes: 0 %
Lymphocytes Relative: 43 %
Lymphs Abs: 1.6 10*3/uL (ref 0.7–4.0)
MCH: 31.7 pg (ref 26.0–34.0)
MCHC: 33.7 g/dL (ref 30.0–36.0)
MCV: 94.2 fL (ref 80.0–100.0)
Monocytes Absolute: 0.3 10*3/uL (ref 0.1–1.0)
Monocytes Relative: 7 %
Neutro Abs: 1.9 10*3/uL (ref 1.7–7.7)
Neutrophils Relative %: 49 %
Platelets: 275 10*3/uL (ref 150–400)
RBC: 4.45 MIL/uL (ref 3.87–5.11)
RDW: 12.6 % (ref 11.5–15.5)
WBC: 3.8 10*3/uL — ABNORMAL LOW (ref 4.0–10.5)
nRBC: 0 % (ref 0.0–0.2)

## 2020-10-27 LAB — URINALYSIS, ROUTINE W REFLEX MICROSCOPIC
Bacteria, UA: NONE SEEN
Bilirubin Urine: NEGATIVE
Glucose, UA: NEGATIVE mg/dL
Hgb urine dipstick: NEGATIVE
Ketones, ur: NEGATIVE mg/dL
Leukocytes,Ua: NEGATIVE
Nitrite: NEGATIVE
Protein, ur: NEGATIVE mg/dL
Specific Gravity, Urine: 1.01 (ref 1.005–1.030)
pH: 6 (ref 5.0–8.0)

## 2020-10-27 LAB — PREGNANCY, URINE: Preg Test, Ur: NEGATIVE

## 2020-10-27 MED ORDER — SODIUM CHLORIDE 0.9 % IV BOLUS
1000.0000 mL | Freq: Once | INTRAVENOUS | Status: AC
  Administered 2020-10-27: 1000 mL via INTRAVENOUS

## 2020-10-27 MED ORDER — ONDANSETRON 4 MG PO TBDP
4.0000 mg | ORAL_TABLET | Freq: Once | ORAL | Status: AC
  Administered 2020-10-27: 4 mg via ORAL
  Filled 2020-10-27: qty 1

## 2020-10-27 MED ORDER — ONDANSETRON 4 MG PO TBDP: 4.0000 mg | ORAL_TABLET | Freq: Three times a day (TID) | ORAL | 0 refills | Status: AC | PRN

## 2020-10-27 MED ORDER — ONDANSETRON HCL 4 MG/2ML IJ SOLN: 4.0000 mg | Freq: Once | INTRAMUSCULAR | Status: DC

## 2020-10-27 NOTE — ED Notes (Signed)
Pt is a hard stick 

## 2020-10-27 NOTE — ED Provider Notes (Signed)
[facility] EMERGENCY DEPARTMENT Provider Note  CSN: [id number] Arrival date & time: 10/27/20 4098    History Chief Complaint  Patient presents with   Emesis    Anita Davis is a 35 y.o. female with history of hepatitis C and prior substance abuse reports 4 days of nausea, vomiting, and diarrhea. Associated with headaches. She denies any fever or abdominal pain. Reports she saw some blood in her stool which she attributes to hemorrhoids. She has been able to keep down gatorade but has not had any solid food in 2 days.    Past Medical History:  Diagnosis Date   Allergy    Bronchitis    Hepatitis-C    History of gestational diabetes    HPV in female    Substance abuse [facility])     Past Surgical History:  Procedure Laterality Date   HEMORRHOID BANDING  age 15   IR GASTROSTOMY TUBE REMOVAL  04/06/2020   IR REPLC GASTRO/COLONIC TUBE PERCUT W/FLUORO  02/11/2020   LAPAROSCOPIC GASTROSTOMY N/A 02/05/2020   Procedure: LAPAROSCOPIC GASTROSTOMY PLACEMENT;  Surgeon: Gaynelle Adu, MD;  Location: [facility] OR;  Service: General;  Laterality: N/A;    Family History  Problem Relation Age of Onset   Depression Mother    Cervical cancer Mother    Hypertension Father    Diabetes Father     Social History   Tobacco Use   Smoking status: Former    Years: 24.00    Pack years: 0.00    Types: Cigarettes, Cigars   Smokeless tobacco: Never   Tobacco comments:    1 cigar/daily. former 1 pack cigarette smoker quit 05/2019  currently smokes CBD  Vaping Use   Vaping Use: Every day   Substances: Nicotine, Flavoring  Substance Use Topics   Alcohol use: No   Drug use: Not Currently    Types: IV, Cocaine, Hydrocodone, Oxycodone, Marijuana, Heroin    Comment: been off methadone x 7 mos-stop taking subutex x 15 mos     Home Medications Prior to Admission medications   Medication Sig Start Date End Date Taking? Authorizing Provider  ondansetron (ZOFRAN ODT) 4 MG disintegrating tablet Take 1  tablet (4 mg total) by mouth every 8 (eight) hours as needed for nausea or vomiting. 10/27/20  Yes Pollyann Savoy, MD  ARIPiprazole (ABILIFY) 15 MG tablet Take by mouth. 08/11/20   [provider]  citalopram (CELEXA) 40 MG tablet Take 1 tablet (40 mg total) by mouth daily. 07/01/20   Russella Dar, NP  clonazePAM Scarlette Calico) 0.5 MG tablet Take by mouth. 08/11/20   [provider]  docusate sodium (COLACE) 100 MG capsule Take 1 capsule (100 mg total) by mouth 2 (two) times daily. 07/01/20   Russella Dar, NP  FLUoxetine (PROZAC) 20 MG capsule Take by mouth. 08/11/20   [provider]  gabapentin (NEURONTIN) 400 MG capsule Start with 1 po every day x 3 days and then 1 po BID x 3 days and then 1 po TID 07/18/20   [provider]  Melatonin 10 MG TABS Take 10 mg by mouth at bedtime. 07/01/20   Russella Dar, NP  methocarbamol (ROBAXIN) 500 MG tablet Take 1 tablet (500 mg total) by mouth every 6 (six) hours as needed for muscle spasms. 07/01/20   Russella Dar, NP  multivitamin (VIT Lorel Monaco C) CHEW chewable tablet Chew by mouth. 07/01/20   [provider]  naltrexone (DEPADE) 50 MG tablet Take 0.5 tablets (25 mg  total) by mouth daily. 07/01/20   Russella Dar, NP  OXcarbazepine (TRILEPTAL) 150 MG tablet Take 1 tablet (150 mg total) by mouth 2 (two) times daily. 07/01/20   Russella Dar, NP  pantoprazole (PROTONIX) 40 MG tablet Take 1 tablet (40 mg total) by mouth daily. 07/01/20   Russella Dar, NP  polyethylene glycol (MIRALAX / GLYCOLAX) 17 g packet Take 17 g by mouth 2 (two) times daily. 07/01/20   Russella Dar, NP  pyridoxine (B-6) 100 MG tablet Take 1 tablet (100 mg total) by mouth daily. 07/01/20   Russella Dar, NP  thiamine 100 MG tablet Take 1 tablet (100 mg total) by mouth daily. 07/01/20   Russella Dar, NP  topiramate (TOPAMAX) 50 MG tablet Take by mouth. 08/11/20   [provider]  traZODone (DESYREL) 150 MG tablet Take 1  tablet (150 mg total) by mouth at bedtime. 07/01/20   Russella Dar, NP  ziprasidone (GEODON) 80 MG capsule Take 1 capsule (80 mg total) by mouth 2 (two) times daily with a meal. 07/01/20   Russella Dar, NP     Allergies    Patient has no known allergies.   Review of Systems   Review of Systems A comprehensive review of systems was completed and negative except as noted in HPI.    Physical Exam BP 113/66   Pulse 61   Temp 98.1 F (36.7 C) (Oral)   Resp 16   Ht 5\' 3"  (1.6 m)   Wt 81.6 kg   LMP 10/12/2020 (Approximate)   SpO2 99%   BMI 31.89 kg/m   Physical Exam Vitals and nursing note reviewed.  Constitutional:      Appearance: Normal appearance.  HENT:     Head: Normocephalic and atraumatic.     Nose: Nose normal.     Mouth/Throat:     Mouth: Mucous membranes are moist.  Eyes:     Extraocular Movements: Extraocular movements intact.     Conjunctiva/sclera: Conjunctivae normal.  Cardiovascular:     Rate and Rhythm: Normal rate.  Pulmonary:     Effort: Pulmonary effort is normal.     Breath sounds: Normal breath sounds.  Abdominal:     General: Abdomen is flat.     Palpations: Abdomen is soft.     Tenderness: There is no abdominal tenderness.  Musculoskeletal:        General: No swelling. Normal range of motion.     Cervical back: Neck supple.  Skin:    General: Skin is warm and dry.  Neurological:     General: No focal deficit present.     Mental Status: She is alert.  Psychiatric:        Mood and Affect: Mood normal.     ED Results / Procedures / Treatments   Labs (all labs ordered are listed, but only abnormal results are displayed) Labs Reviewed  COMPREHENSIVE METABOLIC PANEL - Abnormal; Notable for the following components:      Result Value   Glucose, Bld 109 (*)    AST 53 (*)    ALT 54 (*)    All other components within normal limits  CBC WITH DIFFERENTIAL/PLATELET - Abnormal; Notable for the following components:   WBC 3.8 (*)    All  other components within normal limits  URINALYSIS, ROUTINE W REFLEX MICROSCOPIC - Abnormal; Notable for the following components:   APPearance HAZY (*)    All other components within normal limits  PREGNANCY, URINE    EKG None  Radiology No results found.  Procedures Procedures  Medications Ordered in the ED Medications  ondansetron (ZOFRAN) injection 4 mg (4 mg Intravenous Not Given 10/27/20 0832)  sodium chloride 0.9 % bolus 1,000 mL (0 mLs Intravenous Stopped 10/27/20 1028)  ondansetron (ZOFRAN-ODT) disintegrating tablet 4 mg (4 mg Oral Given 10/27/20 6803)     MDM Rules/Calculators/A&P MDM Patient with nausea, vomiting, diarrhea. Denies recent alcohol or drug use. Will check labs and give IVF and antiemetics.   ED Course  I have reviewed the triage vital signs and the nursing notes.  Pertinent labs & imaging results that were available during my care of the patient were reviewed by me and considered in my medical decision making (see chart for details).  Clinical Course as of 10/27/20 1243  Thu Oct 27, 2020  0829 CBC is unremarkable.  [CS]  0903 CMP shows Transaminases are improved from previous, otherwise normal.  [CS]  1013 Patient feeling better, labs are unremarkable. Plan discharge with PCP follow up. Rx for Zofran.  [CS]    Clinical Course User Index [CS] Pollyann Savoy, MD    Final Clinical Impression(s) / ED Diagnoses Final diagnoses:  Nausea vomiting and diarrhea    Rx / DC Orders ED Discharge Orders          Ordered    ondansetron (ZOFRAN ODT) 4 MG disintegrating tablet  Every 8 hours PRN        10/27/20 1014             Pollyann Savoy, MD 10/27/20 1243

## 2020-10-27 NOTE — ED Triage Notes (Signed)
Pt c/o emesis, diarrhea, nausea and headache x 4 days

## 2020-12-12 ENCOUNTER — Ambulatory Visit: Payer: Medicaid Other | Admitting: Psychology

## 2020-12-19 ENCOUNTER — Ambulatory Visit: Payer: Medicaid Other | Admitting: Psychology

## 2022-10-07 IMAGING — XA IR REPLACE G-TUBE/COLONIC TUBE
1 series · 1 of 1 positions shown · non-contrast
Comparison: none

INDICATION: 34-year-old female with a history of displaced surgical gastrostomy
which was recently performed. She presents for possible rescue
possible placement

[Series 1: fl (-) angio · 1 of 1 slices shown]
[im 1/1]
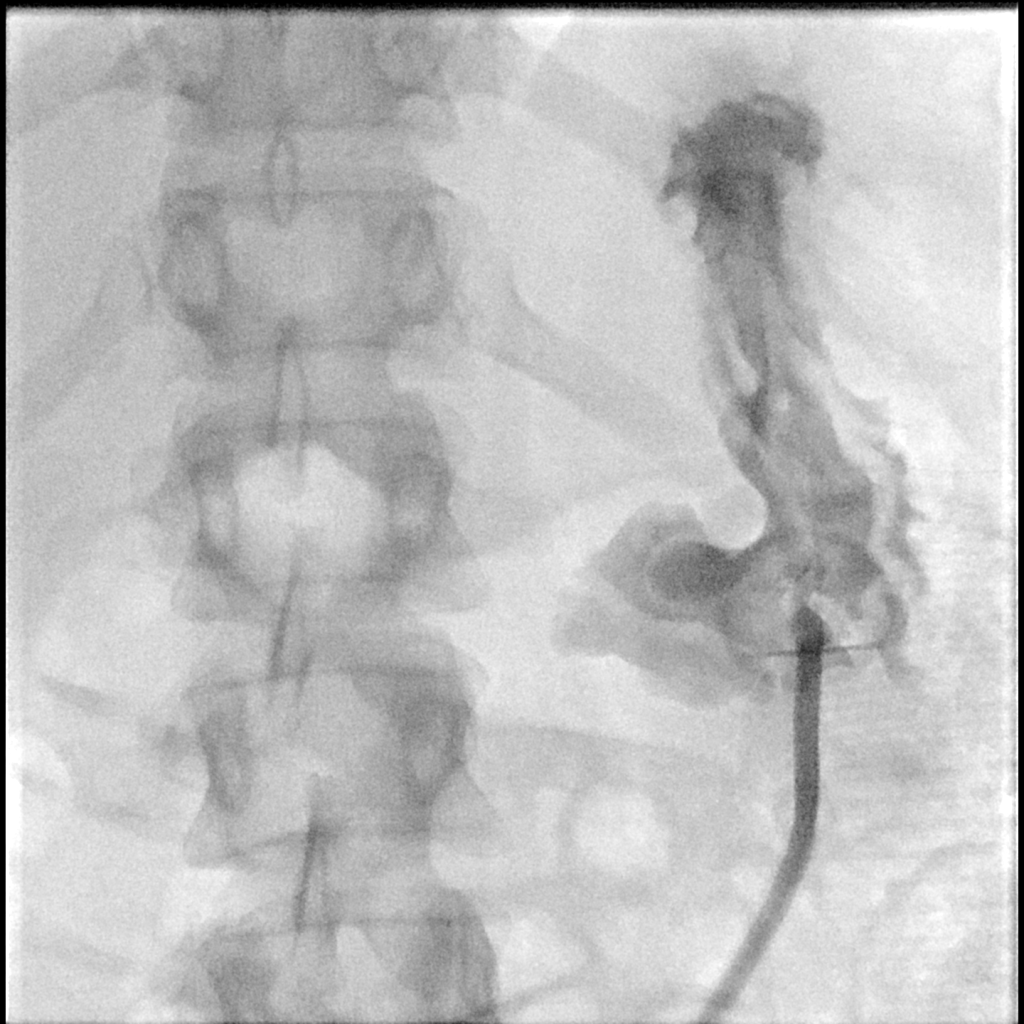

[1 of 1 positions shown; findings below may reference images not displayed]

EXAM:
GASTROSTOMY CATHETER REPLACEMENT

MEDICATIONS:
2 g Ancef; Antibiotics were administered within 1 hour of the
procedure.

ANESTHESIA/SEDATION:
Versed 2.0 mg IV; Fentanyl 50 mcg IV

Moderate Sedation Time:  44 minutes

The patient was continuously monitored during the procedure by the
interventional radiology nurse under my direct supervision.

CONTRAST:  20mL OMNIPAQUE IOHEXOL 300 MG/ML SOLN - administered into
the gastric lumen.

FLUOROSCOPY TIME:  Fluoroscopy Time: 6 minutes 12 seconds (390 mGy).

COMPLICATIONS:
None

PROCEDURE:
Informed written consent was obtained from the patient's family
after a thorough discussion of the procedural risks, benefits and
alternatives. All questions were addressed. Maximal Sterile Barrier
Technique was utilized including caps, mask, sterile gowns, sterile
gloves, sterile drape, hand hygiene and skin antiseptic. A timeout
was performed prior to the initiation of the procedure.

The epigastrium was prepped with Betadine in a sterile fashion, and
a sterile drape was applied covering the operative field. A sterile
gown and sterile gloves were used for the procedure.

We first probed the previous tract by removal of the partially
displaced gastrostomy. We attempted to inject contrast through the
skin and subcutaneous tissues, in an attempt to identify the
previous tract of the stomach lumen. This was unsuccessful.

A 5-French orogastric tube was then placed under fluoroscopic
guidance. Scout imaging of the abdomen confirms barium within the
transverse colon.

The stomach was distended with gas. Under fluoroscopic guidance, an
18 gauge needle was utilized to puncture the anterior wall of the
body of the stomach. An Amplatz wire was advanced through the needle
passing a T fastener into the lumen of the stomach. The T fastener
was secured for gastropexy. A 9-French sheath was inserted.

A snare was advanced through the 9-French sheath. Dejian Law was
advanced through the orogastric tube. It was snared then pulled out
the oral cavity, pulling the snare, as well. The leading edge of the
gastrostomy was attached to the snare. It was then pulled down the
esophagus and out the percutaneous site. Tube secured in place.
Contrast was injected.

Patient tolerated the procedure well and remained hemodynamically
stable throughout.

No complications were encountered and no significant blood loss
encountered.
IMPRESSION: Status post fluoroscopic placed percutaneous gastrostomy tube, with
20 French pull-through.

## 2022-10-31 IMAGING — DX DG ABD PORTABLE 1V
1 series · 1 of 1 positions shown · non-contrast
Comparison: February 22, 2020

CLINICAL DATA: Abdominal distension

EXAM:
PORTABLE ABDOMEN - 1 VIEW

[abdomen kub]
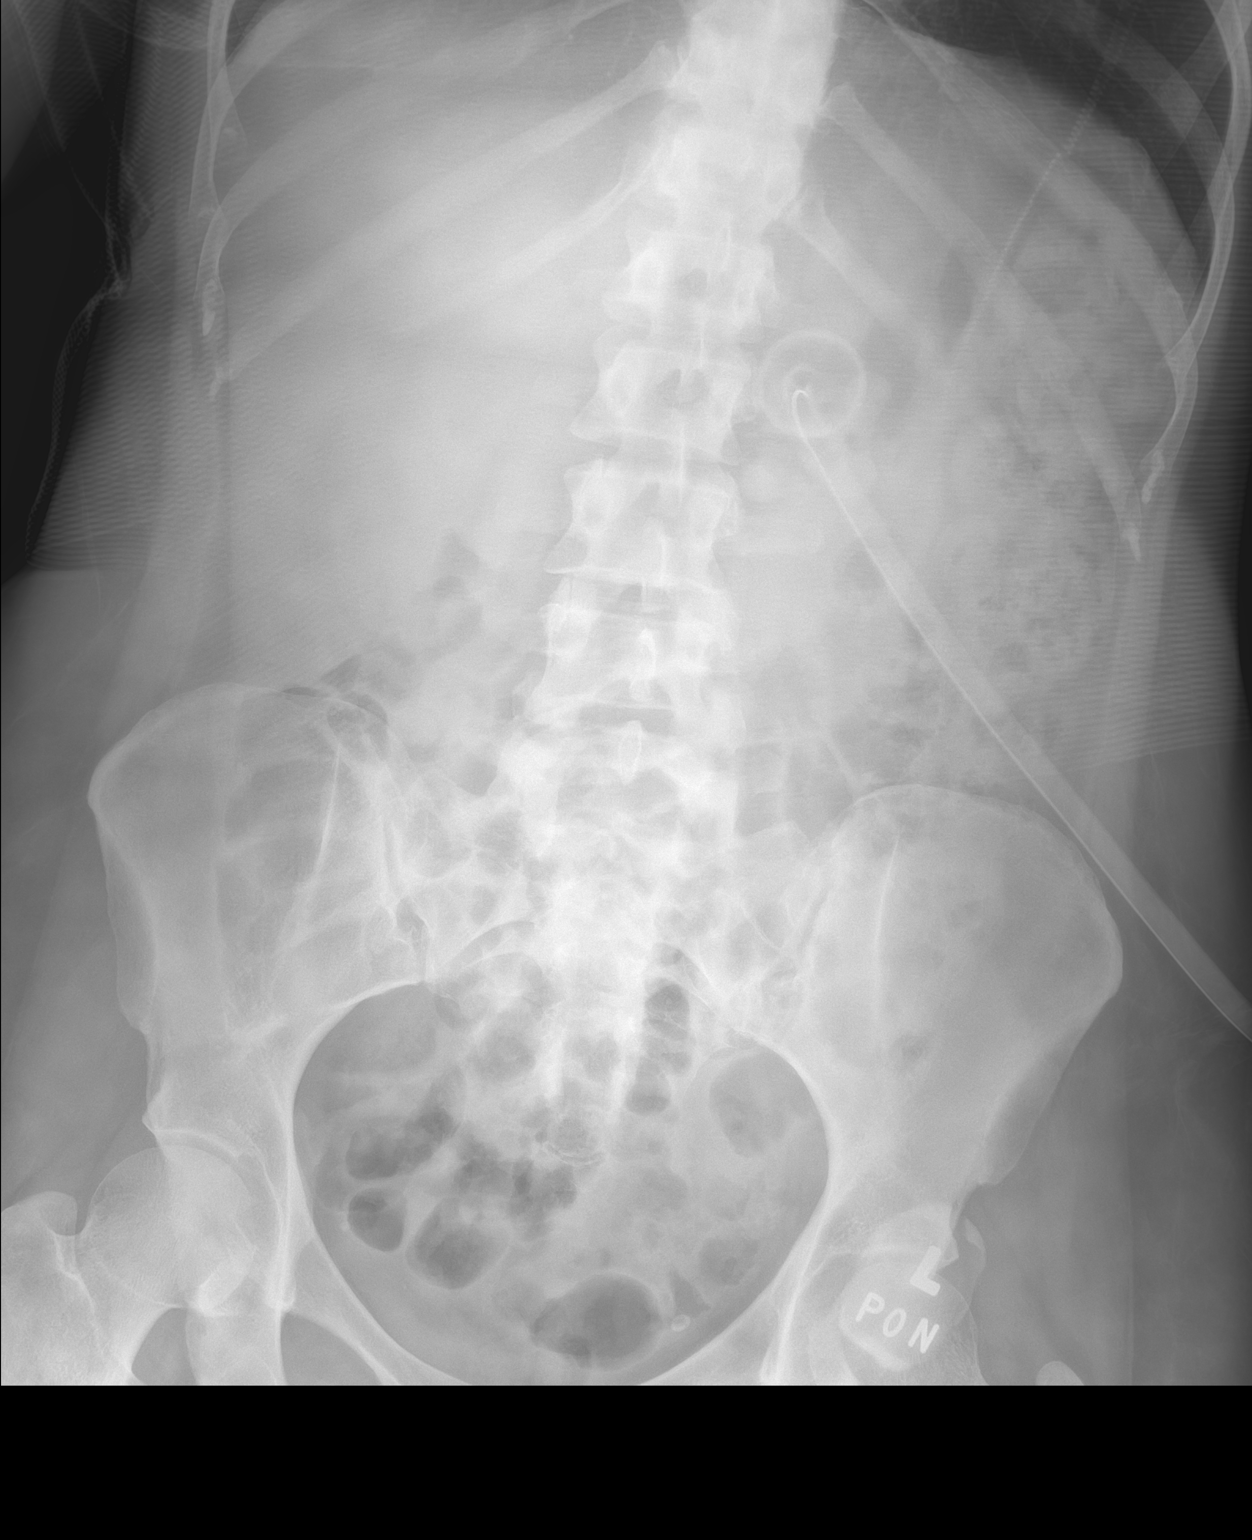

[1 of 1 positions shown; findings below may reference images not displayed]

FINDINGS: Air and stool-filled nondilated loops of bowel. Mild-to-moderate
colonic stool burden in the LEFT hemicolon. G-tube. No acute osseous
abnormality.
IMPRESSION: Nonobstructive bowel gas pattern.

## 2023-02-18 IMAGING — US US PELVIS COMPLETE TRANSABD/TRANSVAG W DUPLEX
1 series · 13 of 25 positions shown · non-contrast
Comparison: None

CLINICAL DATA: Pelvic pain for 2 days, uncertain LMP

EXAM:
TRANSABDOMINAL AND TRANSVAGINAL ULTRASOUND OF PELVIS
DOPPLER ULTRASOUND OF OVARIES
TECHNIQUE: Both transabdominal and transvaginal ultrasound examinations of the
pelvis were performed. Transabdominal technique was performed for
global imaging of the pelvis including uterus, ovaries, adnexal
regions, and pelvic cul-de-sac.
It was necessary to proceed with endovaginal exam following the
transabdominal exam to visualize the uterus, endometrium, and
ovaries. Color and duplex Doppler ultrasound was utilized to
evaluate blood flow to the ovaries.

[Series 1: us pelvic complete w transvaginal and torsion righ · 81 acquisitions, 13 frames shown]
[im 1/81]
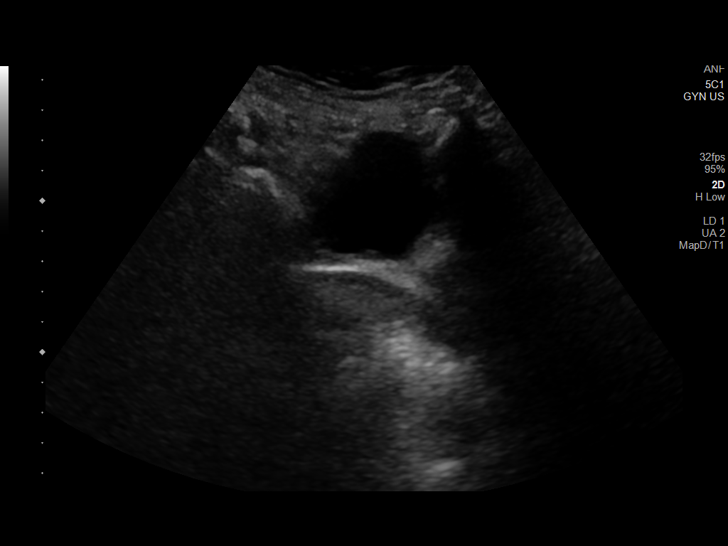
[im 7/81]
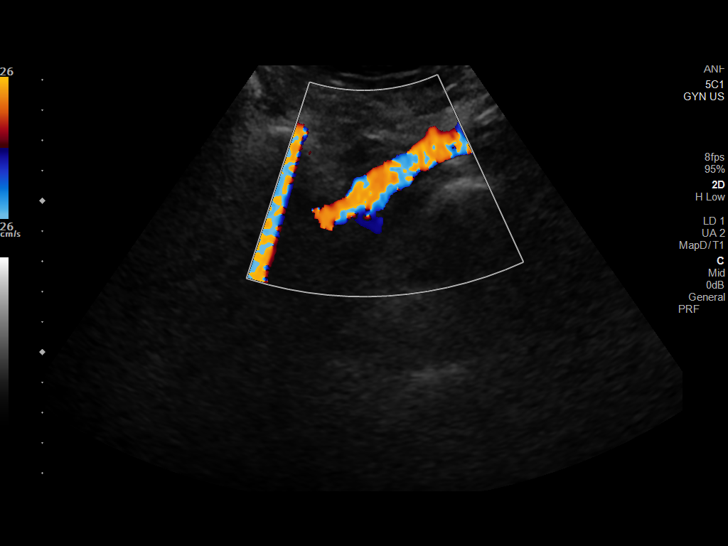
[im 14/81]
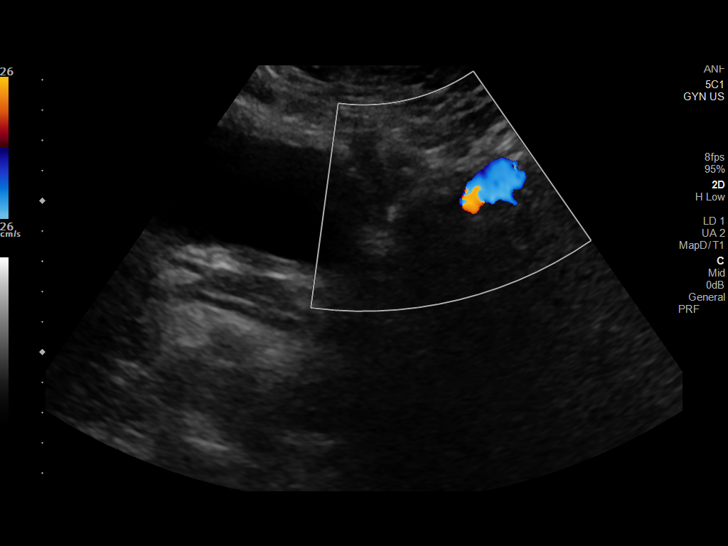
[im 21/81]
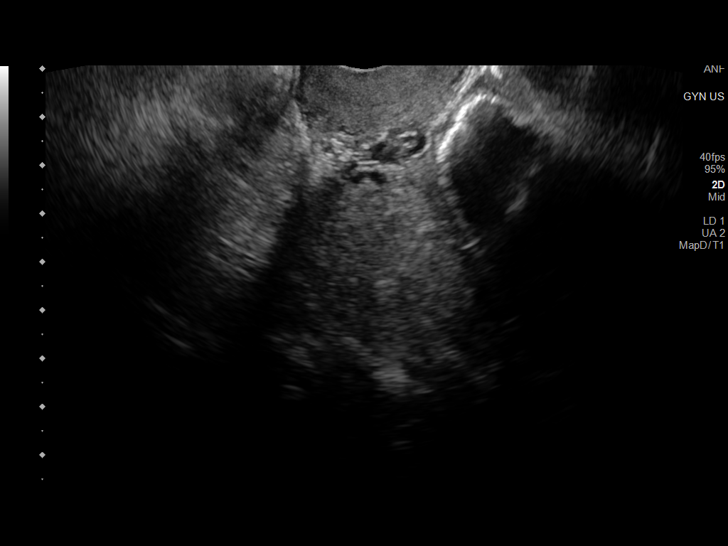
[im 27/81]
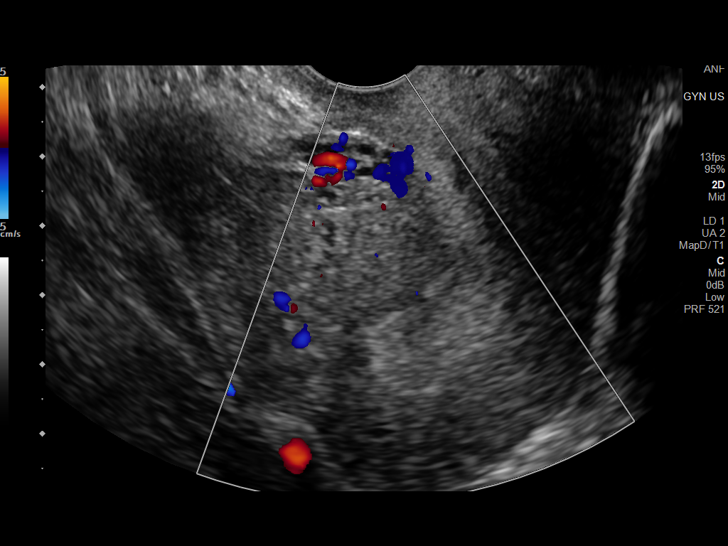
[im 34/81]
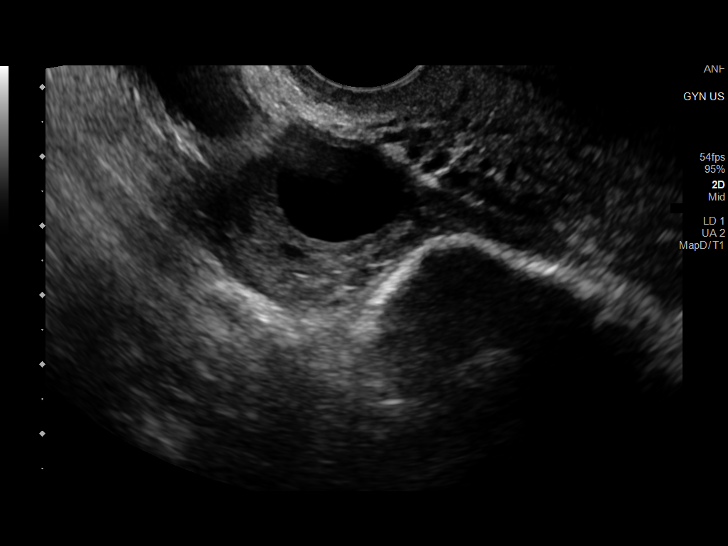
[im 41/81]
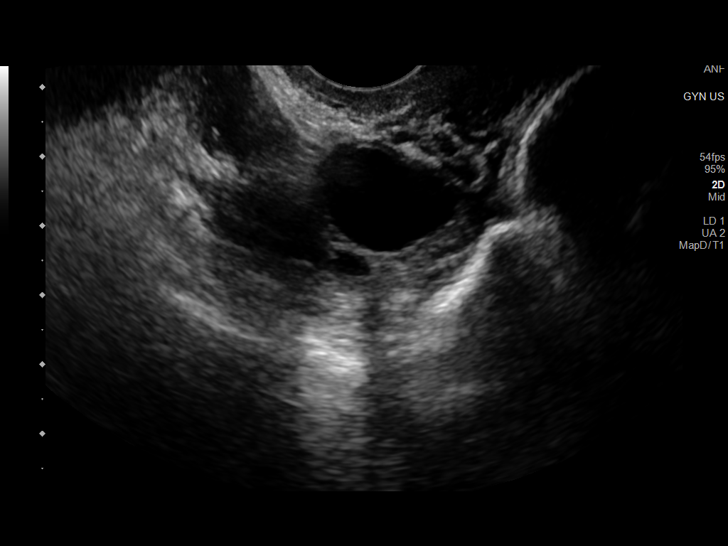
[im 47/81]
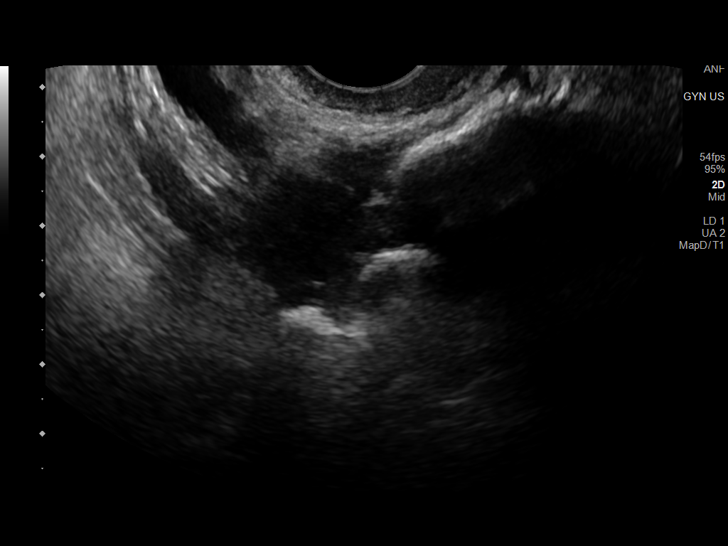
[im 54/81]
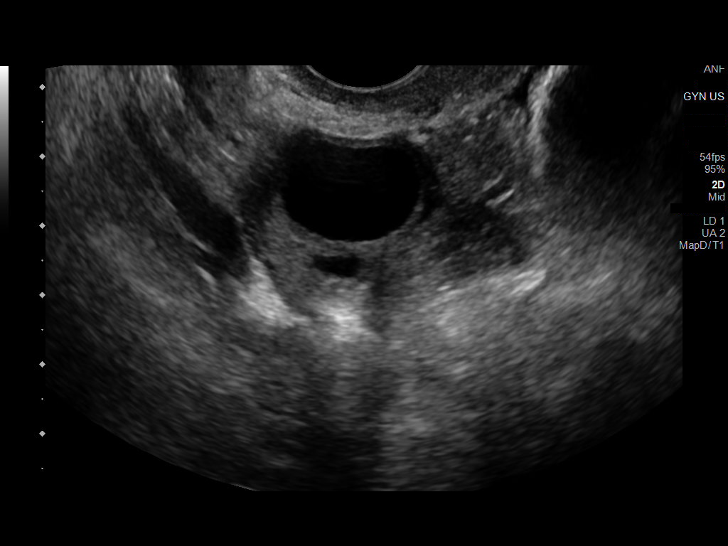
[im 61/81]
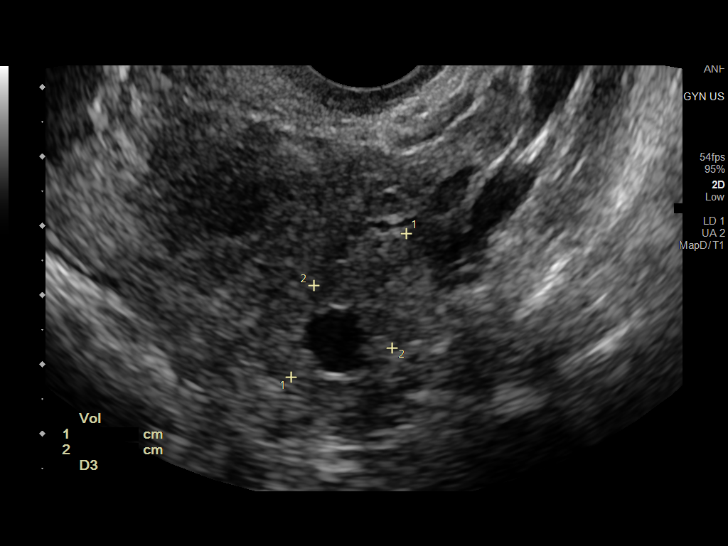
[im 67/81]
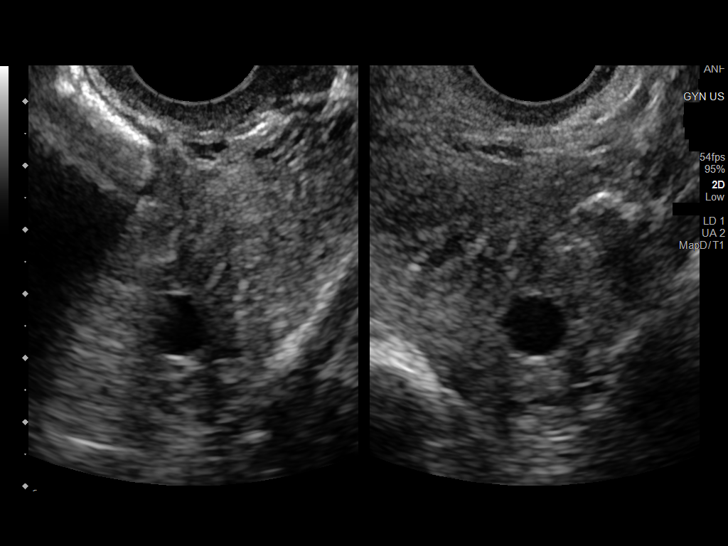
[im 74/81]
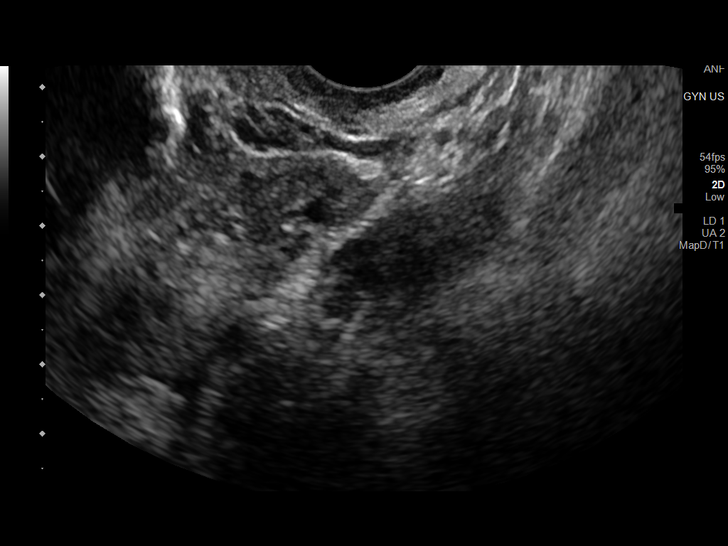
[im 81/81]
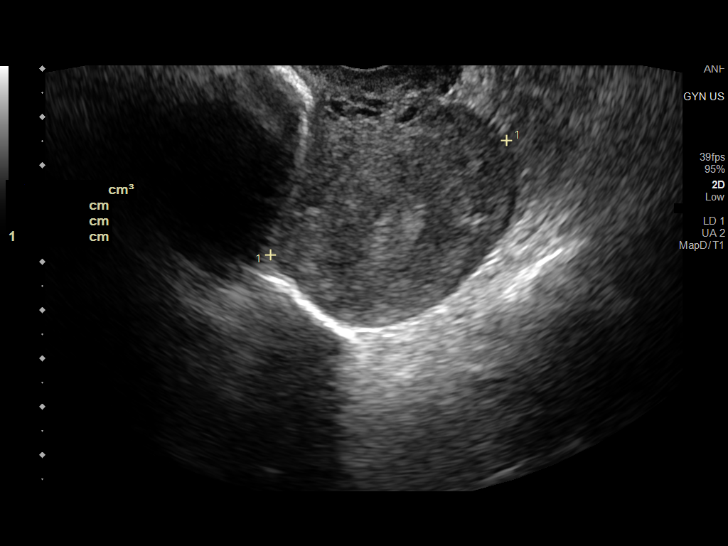

[13 of 25 positions shown; findings below may reference images not displayed]

FINDINGS: Uterus

Measurements: 8.3 x 4.6 x 5.4 cm = volume: 108 mL. Variable
position, flipped from anteverted to retroverted during exam. No
focal mass.

Endometrium

Thickness: 14 mm.  No endometrial fluid or focal abnormality

Right ovary

Measurements: 3.4 x 2.9 x 2.6 cm = volume: 13.2 mL. Normal
morphology without mass. Blood flow present within RIGHT ovary on
color Doppler imaging.

Left ovary

Measurements: 2.3 x 1.5 x 1.2 cm = volume: 2.3 mL. Normal morphology
without mass. Blood flow present within LEFT ovary on color Doppler
imaging.

Pulsed Doppler evaluation of both ovaries demonstrates normal
low-resistance arterial and venous waveforms.

Other findings

No free pelvic fluid.  No adnexal masses.
IMPRESSION: Normal exam.

No evidence of ovarian mass or torsion.
